# Patient Record
Sex: Female | Born: 1935 | Race: White | Hispanic: No | Smoking: Never smoker
Health system: Southern US, Community
[De-identification: ages and names within clinical notes are randomized; demographics above are authoritative.]

## PROBLEM LIST (undated history)

## (undated) DIAGNOSIS — J189 Pneumonia, unspecified organism: Secondary | ICD-10-CM

## (undated) DIAGNOSIS — K754 Autoimmune hepatitis: Secondary | ICD-10-CM

## (undated) DIAGNOSIS — M170 Bilateral primary osteoarthritis of knee: Secondary | ICD-10-CM

## (undated) DIAGNOSIS — K5790 Diverticulosis of intestine, part unspecified, without perforation or abscess without bleeding: Secondary | ICD-10-CM

## (undated) DIAGNOSIS — R9431 Abnormal electrocardiogram [ECG] [EKG]: Secondary | ICD-10-CM

## (undated) DIAGNOSIS — M51369 Other intervertebral disc degeneration, lumbar region without mention of lumbar back pain or lower extremity pain: Secondary | ICD-10-CM

## (undated) DIAGNOSIS — M5136 Other intervertebral disc degeneration, lumbar region: Secondary | ICD-10-CM

## (undated) DIAGNOSIS — I00 Rheumatic fever without heart involvement: Secondary | ICD-10-CM

## (undated) DIAGNOSIS — I480 Paroxysmal atrial fibrillation: Secondary | ICD-10-CM

## (undated) DIAGNOSIS — R600 Localized edema: Secondary | ICD-10-CM

## (undated) DIAGNOSIS — S52302A Unspecified fracture of shaft of left radius, initial encounter for closed fracture: Secondary | ICD-10-CM

## (undated) DIAGNOSIS — D039 Melanoma in situ, unspecified: Secondary | ICD-10-CM

## (undated) DIAGNOSIS — Z87442 Personal history of urinary calculi: Secondary | ICD-10-CM

## (undated) DIAGNOSIS — F419 Anxiety disorder, unspecified: Secondary | ICD-10-CM

## (undated) DIAGNOSIS — M8580 Other specified disorders of bone density and structure, unspecified site: Secondary | ICD-10-CM

## (undated) DIAGNOSIS — I5032 Chronic diastolic (congestive) heart failure: Secondary | ICD-10-CM

## (undated) DIAGNOSIS — I10 Essential (primary) hypertension: Secondary | ICD-10-CM

## (undated) DIAGNOSIS — M1990 Unspecified osteoarthritis, unspecified site: Secondary | ICD-10-CM

## (undated) DIAGNOSIS — K7460 Unspecified cirrhosis of liver: Secondary | ICD-10-CM

## (undated) DIAGNOSIS — N182 Chronic kidney disease, stage 2 (mild): Secondary | ICD-10-CM

## (undated) DIAGNOSIS — R609 Edema, unspecified: Secondary | ICD-10-CM

## (undated) DIAGNOSIS — M1712 Unilateral primary osteoarthritis, left knee: Secondary | ICD-10-CM

## (undated) DIAGNOSIS — Z86010 Personal history of colonic polyps: Secondary | ICD-10-CM

## (undated) DIAGNOSIS — R7989 Other specified abnormal findings of blood chemistry: Secondary | ICD-10-CM

## (undated) DIAGNOSIS — E785 Hyperlipidemia, unspecified: Secondary | ICD-10-CM

## (undated) DIAGNOSIS — K7581 Nonalcoholic steatohepatitis (NASH): Secondary | ICD-10-CM

## (undated) HISTORY — DX: Rheumatic fever without heart involvement: I00

## (undated) HISTORY — DX: Chronic diastolic (congestive) heart failure: I50.32

## (undated) HISTORY — DX: Chronic kidney disease, stage 2 (mild): N18.2

## (undated) HISTORY — DX: Other specified disorders of bone density and structure, unspecified site: M85.80

## (undated) HISTORY — DX: Other intervertebral disc degeneration, lumbar region: M51.36

## (undated) HISTORY — DX: Edema, unspecified: R60.9

## (undated) HISTORY — PX: ECTOPIC PREGNANCY SURGERY: SHX613

## (undated) HISTORY — DX: Hyperlipidemia, unspecified: E78.5

## (undated) HISTORY — PX: TRANSTHORACIC ECHOCARDIOGRAM: SHX275

## (undated) HISTORY — DX: Other specified abnormal findings of blood chemistry: R79.89

## (undated) HISTORY — DX: Personal history of colonic polyps: Z86.010

## (undated) HISTORY — PX: OTHER SURGICAL HISTORY: SHX169

## (undated) HISTORY — DX: Bilateral primary osteoarthritis of knee: M17.0

## (undated) HISTORY — DX: Nonalcoholic steatohepatitis (NASH): K75.81

## (undated) HISTORY — DX: Unspecified osteoarthritis, unspecified site: M19.90

## (undated) HISTORY — DX: Abnormal electrocardiogram (ECG) (EKG): R94.31

## (undated) HISTORY — DX: Unspecified cirrhosis of liver: K74.60

## (undated) HISTORY — DX: Pneumonia, unspecified organism: J18.9

## (undated) HISTORY — DX: Unspecified fracture of shaft of left radius, initial encounter for closed fracture: S52.302A

## (undated) HISTORY — DX: Anxiety disorder, unspecified: F41.9

## (undated) HISTORY — DX: Paroxysmal atrial fibrillation: I48.0

## (undated) HISTORY — PX: TONSILLECTOMY: SUR1361

## (undated) HISTORY — DX: Diverticulosis of intestine, part unspecified, without perforation or abscess without bleeding: K57.90

## (undated) HISTORY — DX: Essential (primary) hypertension: I10

## (undated) HISTORY — DX: Localized edema: R60.0

## (undated) HISTORY — DX: Other intervertebral disc degeneration, lumbar region without mention of lumbar back pain or lower extremity pain: M51.369

## (undated) HISTORY — DX: Autoimmune hepatitis: K75.4

## (undated) HISTORY — PX: EYE SURGERY: SHX253

---
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
## 1964-01-18 HISTORY — PX: APPENDECTOMY: SHX54

## 2000-01-18 DIAGNOSIS — Z860101 Personal history of adenomatous and serrated colon polyps: Secondary | ICD-10-CM

## 2000-01-18 DIAGNOSIS — Z86010 Personal history of colonic polyps: Secondary | ICD-10-CM

## 2000-01-18 HISTORY — DX: Personal history of colonic polyps: Z86.010

## 2000-01-18 HISTORY — DX: Personal history of adenomatous and serrated colon polyps: Z86.0101

## 2000-10-30 ENCOUNTER — Encounter (INDEPENDENT_AMBULATORY_CARE_PROVIDER_SITE_OTHER): Payer: Self-pay | Admitting: Specialist

## 2000-10-30 ENCOUNTER — Other Ambulatory Visit: Admission: RE | Admit: 2000-10-30 | Discharge: 2000-10-30 | Payer: Self-pay | Admitting: Gastroenterology

## 2003-01-09 ENCOUNTER — Other Ambulatory Visit: Admission: RE | Admit: 2003-01-09 | Discharge: 2003-01-09 | Payer: Self-pay | Admitting: Family Medicine

## 2004-04-14 ENCOUNTER — Ambulatory Visit: Payer: Self-pay | Admitting: Family Medicine

## 2004-05-13 ENCOUNTER — Ambulatory Visit: Payer: Self-pay | Admitting: Family Medicine

## 2004-06-02 ENCOUNTER — Ambulatory Visit: Payer: Self-pay

## 2004-06-02 ENCOUNTER — Encounter: Admission: RE | Admit: 2004-06-02 | Discharge: 2004-06-02 | Payer: Self-pay | Admitting: Family Medicine

## 2004-06-23 ENCOUNTER — Ambulatory Visit: Payer: Self-pay | Admitting: Internal Medicine

## 2004-08-04 ENCOUNTER — Ambulatory Visit: Payer: Self-pay | Admitting: Family Medicine

## 2005-01-12 ENCOUNTER — Ambulatory Visit: Payer: Self-pay | Admitting: Family Medicine

## 2005-01-14 ENCOUNTER — Ambulatory Visit: Payer: Self-pay | Admitting: Family Medicine

## 2005-05-14 ENCOUNTER — Emergency Department (HOSPITAL_COMMUNITY): Admission: EM | Admit: 2005-05-14 | Discharge: 2005-05-14 | Payer: Self-pay | Admitting: Emergency Medicine

## 2005-05-17 ENCOUNTER — Encounter: Payer: Self-pay | Admitting: Family Medicine

## 2005-05-17 ENCOUNTER — Ambulatory Visit: Payer: Self-pay | Admitting: Family Medicine

## 2005-05-17 ENCOUNTER — Other Ambulatory Visit: Admission: RE | Admit: 2005-05-17 | Discharge: 2005-05-17 | Payer: Self-pay | Admitting: Family Medicine

## 2005-05-19 ENCOUNTER — Encounter: Payer: Self-pay | Admitting: Family Medicine

## 2005-05-19 LAB — CONVERTED CEMR LAB: Pap Smear: NORMAL

## 2006-05-30 ENCOUNTER — Emergency Department (HOSPITAL_COMMUNITY): Admission: EM | Admit: 2006-05-30 | Discharge: 2006-05-30 | Payer: Self-pay | Admitting: *Deleted

## 2006-06-01 ENCOUNTER — Ambulatory Visit: Payer: Self-pay | Admitting: Family Medicine

## 2006-06-15 ENCOUNTER — Ambulatory Visit: Payer: Self-pay | Admitting: Family Medicine

## 2006-07-18 ENCOUNTER — Ambulatory Visit: Payer: Self-pay | Admitting: Family Medicine

## 2006-11-02 ENCOUNTER — Encounter: Payer: Self-pay | Admitting: Family Medicine

## 2006-12-20 ENCOUNTER — Telehealth: Payer: Self-pay | Admitting: Family Medicine

## 2006-12-27 ENCOUNTER — Ambulatory Visit: Payer: Self-pay | Admitting: Family Medicine

## 2006-12-27 DIAGNOSIS — F411 Generalized anxiety disorder: Secondary | ICD-10-CM | POA: Insufficient documentation

## 2006-12-27 DIAGNOSIS — M129 Arthropathy, unspecified: Secondary | ICD-10-CM | POA: Insufficient documentation

## 2006-12-27 DIAGNOSIS — IMO0002 Reserved for concepts with insufficient information to code with codable children: Secondary | ICD-10-CM

## 2007-01-18 DIAGNOSIS — K5790 Diverticulosis of intestine, part unspecified, without perforation or abscess without bleeding: Secondary | ICD-10-CM

## 2007-01-18 HISTORY — DX: Diverticulosis of intestine, part unspecified, without perforation or abscess without bleeding: K57.90

## 2007-02-13 ENCOUNTER — Telehealth: Payer: Self-pay | Admitting: Family Medicine

## 2007-03-14 ENCOUNTER — Ambulatory Visit: Payer: Self-pay | Admitting: Family Medicine

## 2007-04-04 ENCOUNTER — Ambulatory Visit: Payer: Self-pay | Admitting: Family Medicine

## 2007-04-04 DIAGNOSIS — I10 Essential (primary) hypertension: Secondary | ICD-10-CM

## 2007-04-04 DIAGNOSIS — F410 Panic disorder [episodic paroxysmal anxiety] without agoraphobia: Secondary | ICD-10-CM

## 2007-04-04 DIAGNOSIS — E785 Hyperlipidemia, unspecified: Secondary | ICD-10-CM

## 2007-04-05 ENCOUNTER — Ambulatory Visit: Payer: Self-pay | Admitting: Family Medicine

## 2007-06-12 ENCOUNTER — Telehealth: Payer: Self-pay | Admitting: Family Medicine

## 2007-06-14 ENCOUNTER — Encounter: Payer: Self-pay | Admitting: Family Medicine

## 2007-06-26 ENCOUNTER — Encounter: Payer: Self-pay | Admitting: Family Medicine

## 2007-08-14 ENCOUNTER — Ambulatory Visit: Payer: Self-pay | Admitting: Family Medicine

## 2007-08-14 DIAGNOSIS — R609 Edema, unspecified: Secondary | ICD-10-CM

## 2007-11-07 ENCOUNTER — Ambulatory Visit: Payer: Self-pay | Admitting: Family Medicine

## 2007-11-07 ENCOUNTER — Other Ambulatory Visit: Admission: RE | Admit: 2007-11-07 | Discharge: 2007-11-07 | Payer: Self-pay | Admitting: Family Medicine

## 2007-11-07 ENCOUNTER — Encounter: Payer: Self-pay | Admitting: Family Medicine

## 2007-11-07 DIAGNOSIS — G4700 Insomnia, unspecified: Secondary | ICD-10-CM

## 2007-11-07 DIAGNOSIS — M8580 Other specified disorders of bone density and structure, unspecified site: Secondary | ICD-10-CM

## 2007-11-07 DIAGNOSIS — D649 Anemia, unspecified: Secondary | ICD-10-CM | POA: Insufficient documentation

## 2007-11-07 DIAGNOSIS — T50995A Adverse effect of other drugs, medicaments and biological substances, initial encounter: Secondary | ICD-10-CM

## 2007-11-07 DIAGNOSIS — E039 Hypothyroidism, unspecified: Secondary | ICD-10-CM | POA: Insufficient documentation

## 2007-11-07 LAB — HM PAP SMEAR

## 2007-11-08 LAB — CONVERTED CEMR LAB
ALT: 29 units/L (ref 0–35)
AST: 26 units/L (ref 0–37)
Alkaline Phosphatase: 74 units/L (ref 39–117)
Basophils Relative: 1.4 % (ref 0.0–3.0)
CO2: 28 meq/L (ref 19–32)
Chloride: 107 meq/L (ref 96–112)
Eosinophils Absolute: 0.2 10*3/uL (ref 0.0–0.7)
GFR calc Af Amer: 52 mL/min
GFR calc non Af Amer: 43 mL/min
Hemoglobin: 13.4 g/dL (ref 12.0–15.0)
Lymphocytes Relative: 33.6 % (ref 12.0–46.0)
Monocytes Absolute: 0.3 10*3/uL (ref 0.1–1.0)
Neutro Abs: 2.9 10*3/uL (ref 1.4–7.7)
Platelets: 208 10*3/uL (ref 150–400)
Potassium: 3.7 meq/L (ref 3.5–5.1)
RBC: 4.49 M/uL (ref 3.87–5.11)
RDW: 12.7 % (ref 11.5–14.6)
Total Bilirubin: 1 mg/dL (ref 0.3–1.2)
Total CHOL/HDL Ratio: 2.8
Total Protein: 7.2 g/dL (ref 6.0–8.3)
Vit D, 1,25-Dihydroxy: 31 (ref 30–89)

## 2007-11-22 ENCOUNTER — Encounter: Payer: Self-pay | Admitting: Family Medicine

## 2007-12-07 ENCOUNTER — Ambulatory Visit: Payer: Self-pay | Admitting: Internal Medicine

## 2007-12-19 ENCOUNTER — Encounter: Payer: Self-pay | Admitting: Internal Medicine

## 2007-12-19 ENCOUNTER — Ambulatory Visit: Payer: Self-pay | Admitting: Internal Medicine

## 2007-12-20 ENCOUNTER — Encounter: Payer: Self-pay | Admitting: Internal Medicine

## 2008-01-09 ENCOUNTER — Encounter: Payer: Self-pay | Admitting: Family Medicine

## 2008-02-13 ENCOUNTER — Ambulatory Visit: Payer: Self-pay | Admitting: Family Medicine

## 2008-02-19 ENCOUNTER — Telehealth: Payer: Self-pay | Admitting: Family Medicine

## 2008-04-30 ENCOUNTER — Telehealth (INDEPENDENT_AMBULATORY_CARE_PROVIDER_SITE_OTHER): Payer: Self-pay | Admitting: *Deleted

## 2008-05-20 ENCOUNTER — Ambulatory Visit: Payer: Self-pay | Admitting: Family Medicine

## 2008-05-20 DIAGNOSIS — N6019 Diffuse cystic mastopathy of unspecified breast: Secondary | ICD-10-CM | POA: Insufficient documentation

## 2008-07-04 ENCOUNTER — Encounter: Payer: Self-pay | Admitting: Family Medicine

## 2008-07-09 ENCOUNTER — Encounter: Payer: Self-pay | Admitting: Family Medicine

## 2008-07-16 ENCOUNTER — Encounter: Payer: Self-pay | Admitting: Family Medicine

## 2008-09-09 ENCOUNTER — Ambulatory Visit: Payer: Self-pay | Admitting: Family Medicine

## 2008-09-09 DIAGNOSIS — B029 Zoster without complications: Secondary | ICD-10-CM | POA: Insufficient documentation

## 2008-10-31 ENCOUNTER — Encounter: Payer: Self-pay | Admitting: Family Medicine

## 2008-11-04 ENCOUNTER — Encounter: Payer: Self-pay | Admitting: Family Medicine

## 2008-12-10 ENCOUNTER — Ambulatory Visit: Payer: Self-pay | Admitting: Family Medicine

## 2008-12-16 ENCOUNTER — Encounter: Payer: Self-pay | Admitting: Family Medicine

## 2008-12-17 ENCOUNTER — Encounter (INDEPENDENT_AMBULATORY_CARE_PROVIDER_SITE_OTHER): Payer: Self-pay | Admitting: *Deleted

## 2009-01-07 ENCOUNTER — Encounter: Payer: Self-pay | Admitting: Family Medicine

## 2009-07-15 ENCOUNTER — Encounter: Payer: Self-pay | Admitting: Family Medicine

## 2009-09-01 ENCOUNTER — Ambulatory Visit: Payer: Self-pay | Admitting: Family Medicine

## 2009-09-01 DIAGNOSIS — J029 Acute pharyngitis, unspecified: Secondary | ICD-10-CM

## 2009-09-01 DIAGNOSIS — M5430 Sciatica, unspecified side: Secondary | ICD-10-CM

## 2009-09-01 DIAGNOSIS — S336XXA Sprain of sacroiliac joint, initial encounter: Secondary | ICD-10-CM | POA: Insufficient documentation

## 2009-12-30 ENCOUNTER — Encounter: Payer: Self-pay | Admitting: Family Medicine

## 2009-12-31 ENCOUNTER — Telehealth: Payer: Self-pay | Admitting: Family Medicine

## 2010-01-21 ENCOUNTER — Telehealth: Payer: Self-pay | Admitting: Family Medicine

## 2010-02-14 LAB — CONVERTED CEMR LAB
AST: 26 units/L (ref 0–37)
Albumin: 4.3 g/dL (ref 3.5–5.2)
Alkaline Phosphatase: 93 units/L (ref 39–117)
BUN: 22 mg/dL (ref 6–23)
Bilirubin Urine: NEGATIVE
Bilirubin, Direct: 0 mg/dL (ref 0.0–0.3)
CO2: 31 meq/L (ref 19–32)
Cholesterol: 165 mg/dL (ref 0–200)
Creatinine, Ser: 1.2 mg/dL (ref 0.4–1.2)
Eosinophils Absolute: 0.3 10*3/uL (ref 0.0–0.7)
Eosinophils Relative: 6 % — ABNORMAL HIGH (ref 0.0–5.0)
Glucose, Bld: 102 mg/dL — ABNORMAL HIGH (ref 70–99)
HDL: 56.9 mg/dL (ref >39.00)
Hemoglobin: 14.5 g/dL (ref 12.0–15.0)
Ketones, urine, test strip: NEGATIVE
LDL Cholesterol: 84 mg/dL (ref 0–99)
Lymphs Abs: 1.7 10*3/uL (ref 0.7–4.0)
MCHC: 34.1 g/dL (ref 30.0–36.0)
MCV: 92.1 fL (ref 78.0–100.0)
Monocytes Absolute: 0.3 10*3/uL (ref 0.1–1.0)
Monocytes Relative: 6 % (ref 3.0–12.0)
Neutro Abs: 2.9 10*3/uL (ref 1.4–7.7)
Protein, U semiquant: NEGATIVE
RBC: 4.62 M/uL (ref 3.87–5.11)
Total CHOL/HDL Ratio: 3
Total Protein: 7.7 g/dL (ref 6.0–8.3)
Triglycerides: 123 mg/dL (ref 0.0–149.0)
pH: 6

## 2010-02-16 NOTE — Assessment & Plan Note (Signed)
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 Pain 10 been increasing in severity Patient had developed a sore throat and had a Z-Pak which he started one day prior to seeing me in Pain with severe nausea and to take hydrocodone with some relief only taking 5 mg Q6 to 8 hour She is very anxious person and alprazolam helps considerably blood pressure has been controlled 130/76  Allergies (verified): No Known Drug Allergies  Past History:  Past Medical History: Last updated: 04/04/2007 Hyperlipidemia Hypertension hx rhuematic fever arthritis  Past Surgical History: Last updated: 04/04/2007 Appendectomy Tonsillectomy  Social History: Last updated: 04/04/2007 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, 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  Risk Factors: Smoking Status: never (12/10/2008)  Review of Systems      See HPI  The patient denies anorexia, fever, weight loss, weight gain, vision loss, decreased hearing, hoarseness, chest pain, syncope, dyspnea on exertion, peripheral edema, prolonged cough, headaches, hemoptysis, abdominal pain, melena, hematochezia, severe indigestion/heartburn, hematuria, incontinence, genital sores, muscle weakness, suspicious skin lesions, transient blindness, difficulty walking, depression, unusual weight change, abnormal bleeding, enlarged lymph nodes, angioedema, breast masses, and testicular masses.    Physical  Exam  General:  Well-developed,well-nourished,in no acute distress; alert,appropriate and cooperative throughout examination, appears to be in pain Head:  Normocephalic and atraumatic without obvious abnormalities. No apparent alopecia or balding. Eyes:  No corneal or conjunctival inflammation noted. EOMI. Perrla. Funduscopic exam benign, without hemorrhages, exudates or papilledema. Vision grossly normal. Ears:  External ear exam shows no significant lesions or deformities.  Otoscopic examination reveals clear canals, tympanic membranes are intact bilaterally without bulging, retraction, inflammation or discharge. Hearing is grossly normal bilaterally. Nose:  External nasal examination shows no deformity or inflammation. Nasal mucosa are pink and moist without lesions or exudates. Mouth:  pharyngeal erythema.   small anterior cervical nodes Lungs:  Normal respiratory effort, chest expands symmetrically. Lungs are clear to auscultation, no crackles or wheezes. Heart:  Normal rate and regular rhythm. S1 and S2 normal without gallop, murmur, click, rub or other extra sounds. Msk:  largest there tenderness over both sacroiliac joint, no limitation straight leg raise and, no changes in reflexes Extremities:  No clubbing, cyanosis, edema, or deformity noted with normal full range of motion of all joints.   Skin:  Intact without suspicious lesions or rashes Cervical Nodes:  small anterior cervical nodes   Problems:  Medical Problems Added: 1)  Dx of Acute Pharyngitis  (ICD-462) 2)  Dx of Sciatica, Bilateral  (ICD-724.3) 3)  Dx of Sacroiliac Strain, Acute  (ICD-846.9)  Impression & Recommendations:  Problem # 1:  ACUTE PHARYNGITIS (ICD-462) Assessment New  Her updated medication list for this problem includes:    Adult Aspirin Low Strength 81 Mg Chew (Aspirin) .Marland Kitchen... Take 1 tablet by mouth once  a day    Meloxicam 15 Mg Tabs (Meloxicam) .Marland Kitchen... Take 1 tablet by mouth once a day    Zithromax  Z-pak 250 Mg Tabs (Azithromycin) .Marland Kitchen... 2 now then 1 once daily for infection  Problem # 2:  SCIATICA, BILATERAL (ICD-724.3) Assessment: New  Her updated medication list for this problem includes:    Adult Aspirin Low Strength 81 Mg Chew (Aspirin) .Marland Kitchen... Take 1 tablet by mouth once a day    Meloxicam 15 Mg Tabs (Meloxicam) .Marland Kitchen... Take 1 tablet by mouth once a day    Hydrocodone-acetaminophen 10-650 Mg Tabs (Hydrocodone-acetaminophen) .Marland Kitchen... 1/2 -1 tab q4-6h as needed pain  Problem # 3:  SACROILIAC STRAIN, ACUTE (ICD-846.9) Assessment: New bilateral SI inflammation  Problem # 4:  HYPERTENSION (ICD-401.9) Assessment: Improved  Her updated medication list for this problem includes:    Norvasc 10 Mg Tabs (Amlodipine besylate) .Marland Kitchen... Take 1 tablet by mouth once a day    Furosemide 40 Mg Tabs (Furosemide) .Marland Kitchen... 1 once daily for edema    Cozaar 50 Mg Tabs (Losartan potassium) .Marland Kitchen... 1 by mouth once daily  Problem # 5:  ANXIETY (ICD-300.00) Assessment: Improved  Her updated medication list for this problem includes:    Alprazolam 0.5 Mg Tbdp (Alprazolam) .Marland Kitchen... 1 three times a day for stress  Problem # 6:  INSOMNIA (ICD-780.52) Assessment: Unchanged  Her updated medication list for this problem includes:    Ambien 10 Mg Tabs (Zolpidem tartrate) .Marland Kitchen... Take 1 tablet by mouth at bedtime  Problem # 7:  HYPERLIPIDEMIA (ICD-272.4)  The following medications were removed from the medication list:    Simvastatin 80 Mg Tabs (Simvastatin) .Marland Kitchen... 1 by mouth once daily Her updated medication list for this problem includes:    Lipitor 40 Mg Tabs (Atorvastatin calcium) .Marland Kitchen... 1 once daily for hyperlipidemia  Complete Medication List: 1)  Adult Aspirin Low Strength 81 Mg Chew (Aspirin) .... Take 1 tablet by mouth once a day 2)  Ambien 10 Mg Tabs (Zolpidem tartrate) .... Take 1 tablet by mouth at bedtime 3)  Meloxicam 15 Mg Tabs (Meloxicam) .... Take 1 tablet by mouth once a day 4)  Norvasc 10 Mg Tabs  (Amlodipine besylate) .... Take 1 tablet by mouth once a day 5)  Sm Calcium/vitamin D 500-200 Mg-unit Tabs (Calcium carbonate-vitamin d) .... Take 2 tablet by mouth once a day 6)  Furosemide 40 Mg Tabs (Furosemide) .Marland Kitchen.. 1 once daily for edema 7)  Alprazolam 0.5 Mg Tbdp (Alprazolam) .Marland Kitchen.. 1 three times a day for stress 8)  Vaniqa 13.9 % Crea (Eflornithine hcl) .... Use two times a day 9)  Cozaar 50 Mg Tabs (Losartan potassium) .Marland Kitchen.. 1 by mouth once daily 10)  Zithromax Z-pak 250 Mg Tabs (Azithromycin) .... 2 now then 1 once daily for infection 11)  Vitamin C 500 Mg Tabs (Ascorbic acid) .Marland Kitchen.. 1 bid 12)  Centrum Silver Tabs (Multiple vitamins-minerals) .Marland Kitchen.. 1 qd 13)  B Complex-b12 Tabs (B complex vitamins) .... One q.d. 14)  Vitamin D3 400 Unit Tabs (Cholecalciferol) .... One q.d. 15)  Fish Oil 1200 Mg Caps (Omega-3 fatty acids) .... One b.i.d. 16)  Coq-10 200 Mg Caps (Coenzyme q10) .... One q.d. 17)  Lipitor 40 Mg Tabs (Atorvastatin calcium) .Marland Kitchen.. 1 once daily for hyperlipidemia 18)  Hydrocodone-acetaminophen 10-650 Mg Tabs (Hydrocodone-acetaminophen) .... 1/2 -1 tab q4-6h as needed pain  Other Orders: Depo- Medrol 37m (J1040) Depo- Medrol 423m(J1030) Admin of Therapeutic Inj  intramuscular or subcutaneous ([phone] Patient Instructions: 1)  since discharge returning from ureter P. and friable on 12 August and after seeing the stress and strain of blood into the bladder stair have inflammation of the sacroiliac joints bilaterally with radiation into the legs to the popliteal region 2)  Continue meloxicam 3)  We'll give you  Depo-Medrol 120 mg IM 4)  we'll prescribe hydrocodone 10 mg-acetaminophen 650 q.4 h. p.r.n. for pain take one half to one tab as needed 5)  Continue her Z-Pak for your scratchy throat or pharyngitis Prescriptions: HYDROCODONE-ACETAMINOPHEN 10-650 MG TABS (HYDROCODONE-ACETAMINOPHEN) 1/2 -1 tab q4-6h as needed pain  #60 x 5   Entered and Authorized by:   Emeterio Reeve  MD   Signed by:   Emeterio Reeve MD on 09/01/2009   Method used:   Print then Give to Patient   RxID:   [id number] LIPITOR 40 MG TABS (ATORVASTATIN CALCIUM) 1 once daily for hyperlipidemia  #30 x 11   Entered and Authorized by:   Emeterio Reeve MD   Signed by:   Emeterio Reeve MD on 09/01/2009   Method used:   Electronically to        [facility]. [phone]* (retail)       [address]       [place], [place]  [address]       Ph: [phone]       Fax: [phone]   RxID:   [id number]    Medication Administration  Injection # 1:    Medication: Depo- Medrol 45m    Diagnosis: ARTHRITIS (ICD-716.90)    Route: IM    Site: RUOQ gluteus    Exp Date: 04/2012    Lot #: [id number]    Mfr: Pharmacia    Patient tolerated injection without complications    Given by: RLevora Angel RN (September 01, 2009 1:33 PM)  Injection # 2:    Medication: Depo- Medrol 410m   Diagnosis: ARTHRITIS (ICD-716.90)    Route: IM    Site: RUOQ gluteus    Exp Date: 04/2012    Lot #: [id number]    Mfr: Pharmacia    Patient tolerated injection without complications    Given by: ReLevora AngelRN (September 01, 2009 1:34 PM)  Orders Added: 1)  Depo- Medrol 8030mJ1040] 2)  Depo- Medrol 82m67m1030] 3)  Admin of Therapeutic Inj  intramuscular or subcutaneous [96372] 4)  Est. Patient Level IV [[id number]]

## 2010-02-18 NOTE — Miscellaneous (Signed)
Summary: flu vaccine  Clinical Lists Changes  Observations: Added new observation of FLU VAX: Historical (12/29/2009 12:09)      Immunization History:  Influenza Immunization History:    Influenza:  historical (12/29/2009)

## 2010-02-18 NOTE — Progress Notes (Signed)
Summary: REQUEST FOR VERIFICATION  Phone Note From Pharmacy   Caller: [facility]. #[id number]* Summary of Call: Called to adv that they faxed in a request and received a msg left on their v/m from [place].... Rx request for xanax but a Rx for Lorrin Mais was sent to them (both are on med list) ? ..... Would like a return call to verify same.....  # [phone].  Initial call taken by: Duanne Moron,  December 31, 2009 12:14 PM  Follow-up for Phone Call        both refills called in Follow-up by: Westley Hummer CMA Deborra Medina),  January 01, 2010 10:50 AM

## 2010-02-18 NOTE — Progress Notes (Signed)
Summary: Pt req a fasting cpx befor May 2012  Phone Note Call from Patient Call back at [facility] Main Phone [phone] Call back at Work Phone [phone]   Caller: Patient Summary of Call: Pt has sch her fasting cpx for 05/25/10 at 10am, because it was the first available a.m cpx. Pt req to be worked in sooner that that for fasting cpx. Pls advise.  Initial call taken by: Braulio Bosch,  January 21, 2010 11:06 AM  Follow-up for Phone Call        [facility] to advise pt no available cpx appt before 05/25/10. Follow-up by: Candace Cruise,  January 26, 2010 12:55 PM  Additional Follow-up for Phone Call Additional follow up Details #1::        Pt returned call and has been notified with the info noted above.    Additional Follow-up by: Braulio Bosch,  January 27, 2010 8:08 AM

## 2010-03-04 ENCOUNTER — Other Ambulatory Visit: Payer: Self-pay | Admitting: Family Medicine

## 2010-04-11 ENCOUNTER — Other Ambulatory Visit: Payer: Self-pay | Admitting: Family Medicine

## 2010-04-20 ENCOUNTER — Other Ambulatory Visit: Payer: Self-pay | Admitting: Family Medicine

## 2010-04-27 ENCOUNTER — Other Ambulatory Visit: Payer: Self-pay

## 2010-04-27 MED ORDER — ALPRAZOLAM 0.5 MG PO TABS: 0.5000 mg | ORAL_TABLET | Freq: Three times a day (TID) | ORAL | Status: DC | PRN

## 2010-04-27 NOTE — Telephone Encounter (Signed)
rx faxed to [facility] on [address]

## 2010-05-25 ENCOUNTER — Ambulatory Visit (INDEPENDENT_AMBULATORY_CARE_PROVIDER_SITE_OTHER): Payer: Medicare Other | Admitting: Family Medicine

## 2010-05-25 ENCOUNTER — Encounter: Payer: Self-pay | Admitting: Family Medicine

## 2010-05-25 VITALS — BP 122/70 | HR 87 | Temp 98.8°F | Resp 15 | Ht 62.0 in | Wt 168.0 lb

## 2010-05-25 DIAGNOSIS — I10 Essential (primary) hypertension: Secondary | ICD-10-CM

## 2010-05-25 DIAGNOSIS — E039 Hypothyroidism, unspecified: Secondary | ICD-10-CM

## 2010-05-25 DIAGNOSIS — G4700 Insomnia, unspecified: Secondary | ICD-10-CM

## 2010-05-25 DIAGNOSIS — D649 Anemia, unspecified: Secondary | ICD-10-CM

## 2010-05-25 DIAGNOSIS — R350 Frequency of micturition: Secondary | ICD-10-CM

## 2010-05-25 DIAGNOSIS — E559 Vitamin D deficiency, unspecified: Secondary | ICD-10-CM

## 2010-05-25 DIAGNOSIS — F410 Panic disorder [episodic paroxysmal anxiety] without agoraphobia: Secondary | ICD-10-CM

## 2010-05-25 DIAGNOSIS — E785 Hyperlipidemia, unspecified: Secondary | ICD-10-CM

## 2010-05-25 LAB — BASIC METABOLIC PANEL
BUN: 18 mg/dL (ref 6–23)
CO2: 29 mEq/L (ref 19–32)
Chloride: 106 mEq/L (ref 96–112)
Creatinine, Ser: 0.9 mg/dL (ref 0.4–1.2)

## 2010-05-25 LAB — HEPATIC FUNCTION PANEL
ALT: 24 U/L (ref 0–35)
Albumin: 4.2 g/dL (ref 3.5–5.2)
Bilirubin, Direct: 0.1 mg/dL (ref 0.0–0.3)
Total Protein: 7.2 g/dL (ref 6.0–8.3)

## 2010-05-25 LAB — POCT URINALYSIS DIPSTICK
Bilirubin, UA: NEGATIVE
Blood, UA: NEGATIVE
Nitrite, UA: NEGATIVE
Protein, UA: NEGATIVE
Urobilinogen, UA: 0.2
pH, UA: 5

## 2010-05-25 LAB — CBC WITH DIFFERENTIAL/PLATELET
Basophils Relative: 0.4 % (ref 0.0–3.0)
HCT: 42.7 % (ref 36.0–46.0)
Hemoglobin: 14.3 g/dL (ref 12.0–15.0)
Lymphocytes Relative: 29.3 % (ref 12.0–46.0)
MCHC: 33.4 g/dL (ref 30.0–36.0)
Monocytes Relative: 5.1 % (ref 3.0–12.0)
Neutro Abs: 4.3 10*3/uL (ref 1.4–7.7)
RBC: 4.77 Mil/uL (ref 3.87–5.11)

## 2010-05-25 LAB — LIPID PANEL
Cholesterol: 155 mg/dL (ref 0–200)
LDL Cholesterol: 70 mg/dL (ref 0–99)

## 2010-05-25 MED ORDER — ALPRAZOLAM 0.5 MG PO TABS: ORAL_TABLET | ORAL | Status: DC

## 2010-05-25 MED ORDER — EFLORNITHINE HCL 13.9 % EX CREA: TOPICAL_CREAM | CUTANEOUS | Status: DC

## 2010-05-25 MED ORDER — AMLODIPINE BESYLATE 5 MG PO TABS: 5.0000 mg | ORAL_TABLET | Freq: Every day | ORAL | Status: DC

## 2010-05-25 MED ORDER — ATORVASTATIN CALCIUM 40 MG PO TABS: 40.0000 mg | ORAL_TABLET | Freq: Every day | ORAL | Status: DC

## 2010-05-25 MED ORDER — LOSARTAN POTASSIUM 100 MG PO TABS: 100.0000 mg | ORAL_TABLET | Freq: Every day | ORAL | Status: DC

## 2010-05-25 MED ORDER — FUROSEMIDE 40 MG PO TABS: 40.0000 mg | ORAL_TABLET | Freq: Two times a day (BID) | ORAL | Status: DC

## 2010-05-25 MED ORDER — MELOXICAM 15 MG PO TABS: 15.0000 mg | ORAL_TABLET | Freq: Every day | ORAL | Status: DC

## 2010-05-25 NOTE — Patient Instructions (Addendum)
I think you are doing fine, to help with edema take 7m furosemide in am and 20 mg after lunch.  Wear stcokings as you need to Changed dosage onlosartan and amlodypine,increase losartan and decrease amlodipine to 5 mgWill call results of lab, refilled medications

## 2010-05-31 ENCOUNTER — Encounter: Payer: Self-pay | Admitting: Family Medicine

## 2010-05-31 NOTE — Progress Notes (Signed)
  Subjective:    Patient ID: Brenda Matthews, female    DOB: Jun 15, 1935, 75 y.o.   MRN: [id number] This 75 year old white married female is in to discuss her medical problems as well as review her medications and get this her lab studies She relates she continues to have panic attacks and needs her alprazolam at times. She had been under considerable stress recently with the problems within the family in regard to her son who is an alcoholic and is now in rehabilitation Her problem with peripheral edema is much improved Hypertension is well controlled, arthritis is controlled with meloxicam Insomnia has been much improved despite the fact she has sleep apnea which were going to get a sleep study Patient has had herpes zoster in the last 2 years so does not need zostavac  at this time HPI    Review of Systemssee history of present illness    Objective:   Physical Exam the patient is a well-developed well-nourished white female  HEENT negative including carotid arteries to be good no bruits  thyroid nonpalpable Chest lungs are clear to palpation percussion and auscultation no rales noted almost no wheezingBreasts full no mass is less cystic than previously  Nipples everted, normal Axilla clear no lymphadenopathy Heart no evidence for cardiomegaly no murmurs regular rhythm Abdomen liver spleen kidney nonpalpable Pelvic and rectal examination not done on this examination Extremities trace peripheral edema Neurological negative reflexes 2+ bilaterally Skin negative       Assessment & Plan:  Hypertension no change in treatment Plan to check with Dr. Henrene Pastor regarding scheduling  of the colonoscopic exam Panic attack to continue alprazolam as instructed2 schedule bone density Plan to get laboratory studies Refill medications Impression this patient is doing her well and has her medical problems under control

## 2010-08-11 LAB — HM MAMMOGRAPHY

## 2010-08-13 ENCOUNTER — Encounter: Payer: Self-pay | Admitting: Family Medicine

## 2010-08-13 ENCOUNTER — Other Ambulatory Visit: Payer: Self-pay

## 2010-08-13 MED ORDER — LOSARTAN POTASSIUM 50 MG PO TABS: 50.0000 mg | ORAL_TABLET | Freq: Every day | ORAL | Status: DC

## 2010-08-13 NOTE — Telephone Encounter (Signed)
Per Dr. Joni Fears pt is to stay on losartan 50 mg qd; rx sent to pharmacy.

## 2010-08-23 ENCOUNTER — Telehealth: Payer: Self-pay | Admitting: Family Medicine

## 2010-08-23 NOTE — Telephone Encounter (Signed)
Pt has questions re: bone density results. Pt rcvd a copy of results, but does not understand information.

## 2010-08-23 NOTE — Telephone Encounter (Signed)
Per Dr. Joni Fears called to make pt aware that overall pt;s bone density was good and pt is aware of recommendations.

## 2010-08-25 ENCOUNTER — Encounter: Payer: Self-pay | Admitting: Family Medicine

## 2010-09-22 ENCOUNTER — Other Ambulatory Visit: Payer: Self-pay

## 2010-09-22 MED ORDER — CIPROFLOXACIN HCL 500 MG PO TABS: 500.0000 mg | ORAL_TABLET | Freq: Two times a day (BID) | ORAL | Status: AC

## 2010-09-22 MED ORDER — AZITHROMYCIN 250 MG PO TABS: ORAL_TABLET | ORAL | Status: AC

## 2010-10-26 ENCOUNTER — Other Ambulatory Visit: Payer: Self-pay | Admitting: Family Medicine

## 2010-11-16 ENCOUNTER — Emergency Department (HOSPITAL_COMMUNITY)
Admission: EM | Admit: 2010-11-16 | Discharge: 2010-11-16 | Disposition: A | Discharge status: Home / Self Care | Payer: Medicare Other | Attending: Emergency Medicine | Admitting: Emergency Medicine

## 2010-11-16 ENCOUNTER — Emergency Department (HOSPITAL_COMMUNITY): Payer: Medicare Other

## 2010-11-16 DIAGNOSIS — W19XXXA Unspecified fall, initial encounter: Secondary | ICD-10-CM | POA: Insufficient documentation

## 2010-11-16 DIAGNOSIS — M25539 Pain in unspecified wrist: Secondary | ICD-10-CM | POA: Insufficient documentation

## 2010-11-16 DIAGNOSIS — S52599A Other fractures of lower end of unspecified radius, initial encounter for closed fracture: Secondary | ICD-10-CM | POA: Insufficient documentation

## 2010-11-16 DIAGNOSIS — I10 Essential (primary) hypertension: Secondary | ICD-10-CM | POA: Insufficient documentation

## 2010-11-16 DIAGNOSIS — M129 Arthropathy, unspecified: Secondary | ICD-10-CM | POA: Insufficient documentation

## 2010-11-16 DIAGNOSIS — Y92009 Unspecified place in unspecified non-institutional (private) residence as the place of occurrence of the external cause: Secondary | ICD-10-CM | POA: Insufficient documentation

## 2010-11-16 DIAGNOSIS — S52302A Unspecified fracture of shaft of left radius, initial encounter for closed fracture: Secondary | ICD-10-CM

## 2010-11-16 HISTORY — DX: Unspecified fracture of shaft of left radius, initial encounter for closed fracture: S52.302A

## 2010-11-18 HISTORY — PX: ORIF RADIAL FRACTURE: SHX5113

## 2011-01-18 HISTORY — PX: CATARACT EXTRACTION W/ INTRAOCULAR LENS IMPLANT: SHX1309

## 2011-01-22 ENCOUNTER — Other Ambulatory Visit: Payer: Self-pay | Admitting: Family Medicine

## 2011-02-04 ENCOUNTER — Ambulatory Visit: Payer: Medicare Other | Admitting: Family Medicine

## 2011-02-18 ENCOUNTER — Ambulatory Visit: Payer: Medicare Other | Admitting: Family Medicine

## 2011-02-24 ENCOUNTER — Ambulatory Visit (INDEPENDENT_AMBULATORY_CARE_PROVIDER_SITE_OTHER): Payer: Medicare Other | Admitting: Family Medicine

## 2011-02-24 ENCOUNTER — Encounter: Payer: Self-pay | Admitting: Family Medicine

## 2011-02-24 VITALS — BP 137/75 | HR 69 | Temp 99.0°F | Ht 62.0 in | Wt 170.0 lb

## 2011-02-24 DIAGNOSIS — F411 Generalized anxiety disorder: Secondary | ICD-10-CM

## 2011-02-24 DIAGNOSIS — E785 Hyperlipidemia, unspecified: Secondary | ICD-10-CM

## 2011-02-24 DIAGNOSIS — I10 Essential (primary) hypertension: Secondary | ICD-10-CM

## 2011-02-24 DIAGNOSIS — IMO0002 Reserved for concepts with insufficient information to code with codable children: Secondary | ICD-10-CM

## 2011-02-24 DIAGNOSIS — Z124 Encounter for screening for malignant neoplasm of cervix: Secondary | ICD-10-CM

## 2011-02-24 LAB — COMPREHENSIVE METABOLIC PANEL
ALT: 21 U/L (ref 0–35)
AST: 21 U/L (ref 0–37)
BUN: 21 mg/dL (ref 6–23)
Calcium: 9.3 mg/dL (ref 8.4–10.5)
Chloride: 106 mEq/L (ref 96–112)
Creatinine, Ser: 1 mg/dL (ref 0.4–1.2)
GFR: 61.11 mL/min (ref >60.00)
Total Bilirubin: 0.6 mg/dL (ref 0.3–1.2)

## 2011-02-24 NOTE — Assessment & Plan Note (Addendum)
Problem stable.  Continue current medications and diet appropriate for this condition.  We have reviewed our general long term plan for this problem and also reviewed symptoms and signs that should prompt the patient to call or return to the office. Check lytes/cr today.

## 2011-02-24 NOTE — Assessment & Plan Note (Signed)
Problem stable.  Continue current medications and diet appropriate for this condition.  We have reviewed our general long term plan for this problem and also reviewed symptoms and signs that should prompt the patient to call or return to the office.  

## 2011-02-24 NOTE — Progress Notes (Signed)
Office Note 02/24/2011  CC:  Chief Complaint  Patient presents with  . Establish Care    transfer from Dr. Joni Fears    HPI:  Brenda Matthews is a 76 y.o. White female who is here to transfer care from Dr. Joni Fears, who recently retired from [employer] at [place]. Old records in EPIC/HL were reviewed prior to or during today's visit.  No acute complaints today. Has f/u with ortho very soon for wrist fx surgery done 11/18/10.  Says wrist feels good. Needs to med RFs today.   Reviewed last labs from 05/2010: lipids good, cbc good, TSH normal, CMET normal. She gets bone densitometry and mammograms routinely q2 yrs.  Says anxiety is quiescent lately, says she takes 1 to 3 of the 0.84m tabs per day.   Past Medical History  Diagnosis Date  . Hyperlipidemia   . Hypertension   . Rheumatic fever   . Arthritis   . Anxiety     with panic  . Peripheral edema   . Fracture of radial shaft, left, closed 11/16/10    fell down flight of stairs  . Hx of adenomatous colonic polyps 2002    surveillance colonoscopy 2009, +polypectomy done-tubular adenoma w/out high grade.    . Diverticulosis 2009 colonoscopy  . Osteopenia   . Abnormal EKG approx 2008    Nuclear stress test neg;   . Cataract     bilat; not surgical yet per pt report    Past Surgical History  Procedure Date  . Appendectomy 1966    done during surgery for tubal pregnancy  . Tonsillectomy   . Orif radial fracture 11/18/10    left    Family History  Problem Relation Age of Onset  . Heart disease Mother   . Heart disease Father   . Hypertension Brother     History   Social History  . Marital Status: Married    Spouse Name: N/A    Number of Children: N/A  . Years of Education: N/A   Occupational History  . Not on file.   Social History Main Topics  . Smoking status: Never Smoker   . Smokeless tobacco: Never Used  . Alcohol Use: Yes     occ  . Drug Use: No  . Sexually Active: Not on file   Other Topics  Concern  . 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  Rare alcohol.No drugs.  Exercise: 4 times per week, about 448m    Outpatient Encounter Prescriptions as of 02/24/2011  Medication Sig Dispense Refill  . ALPRAZolam (XANAX) 0.5 MG tablet take 1 tablet by mouth three times a day if needed for STRESS  90 tablet  5  . amLODipine (NORVASC) 5 MG tablet Take 1 tablet (5 mg total) by mouth daily.  90 tablet  3  . Ascorbic Acid (VITAMIN C) 500 MG tablet Take 500 mg by mouth 2 (two) times daily.        . Marland Kitchenspirin 81 MG tablet Take 162 mg by mouth daily.       . Marland Kitchentorvastatin (LIPITOR) 40 MG tablet Take 1 tablet (40 mg total) by mouth daily.  90 tablet  3  . B Complex Vitamins (B COMPLEX-B12 PO) Take by mouth daily.        . calcium-vitamin D (OSCAL WITH D) 500-200 MG-UNIT per tablet Take 1 tablet by mouth daily.        . Cholecalciferol (VITAMIN D3)  400 UNITS CAPS Take by mouth daily.        . Coenzyme Q10 200 MG capsule Take 200 mg by mouth daily.        . furosemide (LASIX) 40 MG tablet Take 1 tablet (40 mg total) by mouth 2 (two) times daily. For blood pressure  180 tablet  3  . losartan (COZAAR) 50 MG tablet Take 1 tablet (50 mg total) by mouth daily.  30 tablet  11  . meloxicam (MOBIC) 15 MG tablet Take 1 tablet (15 mg total) by mouth daily.  90 tablet  1  . Multiple Vitamins-Minerals (CENTRUM SILVER ULTRA WOMENS) TABS Take by mouth.        . Omega-3 Fatty Acids (FISH OIL) 1200 MG CAPS Take by mouth 2 (two) times daily.        Marland Kitchen DISCONTD: azithromycin (ZITHROMAX) 1 G powder Take 1 packet by mouth once.        Marland Kitchen DISCONTD: Eflornithine HCl (VANIQA) 13.9 % cream Apply 2 times daily  30 g  11    No Known Allergies  ROS Review of Systems  Constitutional: Negative for fever and fatigue.  HENT: Negative for congestion and sore throat.   Eyes: Negative for visual disturbance.  Respiratory: Negative for cough.   Cardiovascular: Negative  for chest pain.  Gastrointestinal: Negative for nausea and abdominal pain.  Genitourinary: Negative for dysuria.  Musculoskeletal: Negative for back pain and joint swelling.  Skin: Negative for rash.  Neurological: Negative for weakness and headaches.  Hematological: Negative for adenopathy.    PE; Blood pressure 137/75, pulse 69, temperature 99 F (37.2 C), temperature source Temporal, height 5' 2"  (1.575 m), weight 170 lb (77.111 kg), SpO2 95.00%. Gen: Alert, well appearing.  Patient is oriented to person, place, time, and situation. ENT: Ears: EACs clear, normal epithelium.  TMs with good light reflex and landmarks bilaterally.  Eyes: no injection, icteris, swelling, or exudate.  EOMI, PERRLA.  Right eye with obvious cataract visible.  I can't see one on left. Nose: no drainage or turbinate edema/swelling.  No injection or focal lesion.  Mouth: lips without lesion/swelling.  Oral mucosa pink and moist.  Upper and lower dentures in place. Oropharynx without erythema, exudate, or swelling.  Neck - No masses or thyromegaly or limitation in range of motion. Carotids 2+ and without bruit. CV: RRR, no m/r/g.   LUNGS: CTA bilat, nonlabored resps, good aeration in all lung fields. ABD: soft, NT, ND, BS normal.  No hepatospenomegaly or mass.  No bruits. EXT: trace to 1+ PITTING edema bilat.  Nontender.  No erythema.  Pertinent labs:  None today  ASSESSMENT AND PLAN:   Transfer pt:  ANXIETY Problem stable.  Continue current medications and diet appropriate for this condition.  We have reviewed our general long term plan for this problem and also reviewed symptoms and signs that should prompt the patient to call or return to the office.   HYPERLIPIDEMIA Lab Results  Component Value Date   CHOL 155 05/25/2010   HDL 55.40 05/25/2010   LDLCALC 70 05/25/2010   TRIG 149.0 05/25/2010   CHOLHDL 3 05/25/2010   Repeat FLP 94mo Continue lipitor. AST/ALT today.  HYPERTENSION NEC Problem stable.   Continue current medications and diet appropriate for this condition.  We have reviewed our general long term plan for this problem and also reviewed symptoms and signs that should prompt the patient to call or return to the office. Check lytes/cr today.  Return in about 6 months (around 08/24/2011) for for HTN f/u and pap/pelvic exam.  Also schedule annual medicare wellness visit one yr after last one.

## 2011-02-24 NOTE — Assessment & Plan Note (Signed)
Lab Results  Component Value Date   CHOL 155 05/25/2010   HDL 55.40 05/25/2010   LDLCALC 70 05/25/2010   TRIG 149.0 05/25/2010   CHOLHDL 3 05/25/2010   Repeat FLP 15mo Continue lipitor. AST/ALT today.

## 2011-03-04 ENCOUNTER — Encounter: Payer: Self-pay | Admitting: Family Medicine

## 2011-04-21 ENCOUNTER — Other Ambulatory Visit: Payer: Self-pay | Admitting: Family Medicine

## 2011-04-22 ENCOUNTER — Other Ambulatory Visit: Payer: Self-pay | Admitting: Family Medicine

## 2011-05-24 ENCOUNTER — Ambulatory Visit: Payer: Medicare Other | Admitting: Family Medicine

## 2011-05-27 ENCOUNTER — Other Ambulatory Visit: Payer: Self-pay | Admitting: Family Medicine

## 2011-05-27 NOTE — Telephone Encounter (Signed)
eScribe request for refill on amlodipine Last seen on 02/24/11 Follow up on 06/03/11 for Medicare Wellness (regular follow up not needed until 08/2011.) RX sent

## 2011-06-03 ENCOUNTER — Ambulatory Visit (INDEPENDENT_AMBULATORY_CARE_PROVIDER_SITE_OTHER): Payer: Medicare Other | Admitting: Family Medicine

## 2011-06-03 ENCOUNTER — Encounter: Payer: Self-pay | Admitting: Family Medicine

## 2011-06-03 VITALS — BP 144/80 | HR 76 | Temp 97.4°F | Ht 62.75 in | Wt 168.0 lb

## 2011-06-03 DIAGNOSIS — E785 Hyperlipidemia, unspecified: Secondary | ICD-10-CM

## 2011-06-03 DIAGNOSIS — Z0000 Encounter for general adult medical examination without abnormal findings: Secondary | ICD-10-CM

## 2011-06-03 LAB — LIPID PANEL
Cholesterol: 142 mg/dL (ref 0–200)
HDL: 55.5 mg/dL (ref >39.00)
Total CHOL/HDL Ratio: 3
Triglycerides: 97 mg/dL (ref 0.0–149.0)

## 2011-06-03 LAB — POCT URINALYSIS DIPSTICK
Bilirubin, UA: NEGATIVE
Glucose, UA: NEGATIVE
Spec Grav, UA: 1.015
pH, UA: 6.5

## 2011-06-03 LAB — HEPATIC FUNCTION PANEL
ALT: 22 U/L (ref 0–35)
Bilirubin, Direct: 0.1 mg/dL (ref 0.0–0.3)
Total Bilirubin: 0.8 mg/dL (ref 0.3–1.2)
Total Protein: 7 g/dL (ref 6.0–8.3)

## 2011-06-03 LAB — BASIC METABOLIC PANEL
BUN: 21 mg/dL (ref 6–23)
CO2: 26 mEq/L (ref 19–32)
Chloride: 108 mEq/L (ref 96–112)
Creatinine, Ser: 0.9 mg/dL (ref 0.4–1.2)
Glucose, Bld: 90 mg/dL (ref 70–99)
Potassium: 3.8 mEq/L (ref 3.5–5.1)

## 2011-06-03 NOTE — Progress Notes (Addendum)
Subjective:    Brenda Matthews is a 76 y.o. female who presents for Medicare Annual/Subsequent preventive examination.  Preventive Screening-Counseling & Management  Tobacco History  Smoking status  . Never Smoker   Smokeless tobacco  . Never Used    No acute problems.  Current Problems (verified) Patient Active Problem List  Diagnoses  . HYPERLIPIDEMIA  . ANEMIA  . ANXIETY  . PANIC DISORDER  . HYPERTENSION  . ARTHRITIS  . SCIATICA, BILATERAL  . OSTEOPENIA  . INSOMNIA  . EDEMA  . SACROILIAC STRAIN, ACUTE  . Cervical cancer screening    Medications Prior to Visit Current Outpatient Prescriptions on File Prior to Visit  Medication Sig Dispense Refill  . ALPRAZolam (XANAX) 0.5 MG tablet take 1 tablet by mouth three times a day if needed for STRESS  90 tablet  5  . amLODipine (NORVASC) 10 MG tablet take 1 tablet by mouth once daily  90 tablet  2  . amLODipine (NORVASC) 5 MG tablet Take 1 tablet (5 mg total) by mouth daily.  90 tablet  3  . Ascorbic Acid (VITAMIN C) 500 MG tablet Take 500 mg by mouth 2 (two) times daily.        Marland Kitchen aspirin 81 MG tablet Take 162 mg by mouth daily.       Marland Kitchen atorvastatin (LIPITOR) 40 MG tablet take 1 tablet by mouth once daily  90 tablet  3  . B Complex Vitamins (B COMPLEX-B12 PO) Take by mouth daily.        . calcium-vitamin D (OSCAL WITH D) 500-200 MG-UNIT per tablet Take 1 tablet by mouth daily.        . Cholecalciferol (VITAMIN D3) 400 UNITS CAPS Take by mouth daily.        . Coenzyme Q10 200 MG capsule Take 200 mg by mouth daily.        . furosemide (LASIX) 40 MG tablet Take 1 tablet (40 mg total) by mouth 2 (two) times daily. For blood pressure  180 tablet  3  . losartan (COZAAR) 50 MG tablet Take 1 tablet (50 mg total) by mouth daily.  30 tablet  11  . meloxicam (MOBIC) 15 MG tablet Take 1 tablet (15 mg total) by mouth daily.  90 tablet  1  . Multiple Vitamins-Minerals (CENTRUM SILVER ULTRA WOMENS) TABS Take by mouth.        . Omega-3  Fatty Acids (FISH OIL) 1200 MG CAPS Take by mouth 2 (two) times daily.          Current Medications (verified) Current Outpatient Prescriptions  Medication Sig Dispense Refill  . ALPRAZolam (XANAX) 0.5 MG tablet take 1 tablet by mouth three times a day if needed for STRESS  90 tablet  5  . amLODipine (NORVASC) 10 MG tablet take 1 tablet by mouth once daily  90 tablet  2  . amLODipine (NORVASC) 5 MG tablet Take 1 tablet (5 mg total) by mouth daily.  90 tablet  3  . Ascorbic Acid (VITAMIN C) 500 MG tablet Take 500 mg by mouth 2 (two) times daily.        Marland Kitchen aspirin 81 MG tablet Take 162 mg by mouth daily.       Marland Kitchen atorvastatin (LIPITOR) 40 MG tablet take 1 tablet by mouth once daily  90 tablet  3  . B Complex Vitamins (B COMPLEX-B12 PO) Take by mouth daily.        . calcium-vitamin D (OSCAL WITH D) 500-200 MG-UNIT  per tablet Take 1 tablet by mouth daily.        . Cholecalciferol (VITAMIN D3) 400 UNITS CAPS Take by mouth daily.        . Coenzyme Q10 200 MG capsule Take 200 mg by mouth daily.        . furosemide (LASIX) 40 MG tablet Take 1 tablet (40 mg total) by mouth 2 (two) times daily. For blood pressure  180 tablet  3  . losartan (COZAAR) 50 MG tablet Take 1 tablet (50 mg total) by mouth daily.  30 tablet  11  . meloxicam (MOBIC) 15 MG tablet Take 1 tablet (15 mg total) by mouth daily.  90 tablet  1  . Multiple Vitamins-Minerals (CENTRUM SILVER ULTRA WOMENS) TABS Take by mouth.        . Omega-3 Fatty Acids (FISH OIL) 1200 MG CAPS Take by mouth 2 (two) times daily.         *Pt taking 80m amlodipine qd (no 574mdose)  Allergies (verified) Review of patient's allergies indicates no known allergies.   PAST HISTORY  Family History Family History  Problem Relation Age of Onset  . Heart disease Mother   . Heart disease Father   . Hypertension Brother     Social History History  Substance Use Topics  . Smoking status: Never Smoker   . Smokeless tobacco: Never Used  . Alcohol Use: Yes       occ     Are there smokers in your home (other than you)? No  Risk Factors Current exercise habits: walks 4 times per week, goes up and down stairs in 2 story home.  Dietary issues discussed: none   Cardiac risk factors: advanced age (older than 557or men, 6529or women), dyslipidemia and hypertension.  Depression Screen (Note: if answer to either of the following is "Yes", a more complete depression screening is indicated)   Over the past two weeks, have you felt down, depressed or hopeless? No  Over the past two weeks, have you felt little interest or pleasure in doing things? No  Have you lost interest or pleasure in daily life? No  Do you often feel hopeless? No  Do you cry easily over simple problems? No  Activities of Daily Living In your present state of health, do you have any difficulty performing the following activities?:  Driving? No Managing money?  No Feeding yourself? No Getting from bed to chair? No Climbing a flight of stairs? No Preparing food and eating?: No Bathing or showering? No Getting dressed: No Getting to the toilet? No Using the toilet:No Moving around from place to place: No In the past year have you fallen or had a near fall?:Yes   Are you sexually active?  No  Do you have more than one partner?  No  Hearing Difficulties: No Do you often ask people to speak up or repeat themselves? No Do you experience ringing or noises in your ears? No Do you have difficulty understanding soft or whispered voices? No   Do you feel that you have a problem with memory? No  Do you often misplace items? No  Do you feel safe at home?  Yes  Cognitive Testing  Alert? Yes  Normal Appearance?Yes  Oriented to person? Yes  Place? Yes   Time? Yes  Recall of three objects?  Yes  Can perform simple calculations? Yes  Displays appropriate judgment?Yes  Can read the correct time from a watch face?Yes   Advanced  Directives have been discussed with the patient?  Yes  List the Names of Other Physician/Practitioners you currently use: 1.  [facility] for skin exam. 2. [facility] for annual mammograms 3) [facility]oscopy 4. Dr. Apolonio Schneiders, orthopedist, for her wrist fracture 10/2010  Indicate any recent Medical Services you may have received from other than [facility] providers in the past year (date may be approximate).  Immunization History  Administered Date(s) Administered  . H1N1 01/07/2008  . Influenza Split 11/01/2010  . Influenza Whole 12/27/2006, 11/07/2007, 10/31/2008, 12/29/2009  . Pneumococcal Polysaccharide 11/07/2007  . Td 12/10/2008  . Zoster 04/05/2007    Screening Tests Health Maintenance  Topic Date Due  . Influenza Vaccine  10/18/2011  . Colonoscopy  12/20/2012  . Tetanus/tdap  12/11/2018  . Pneumococcal Polysaccharide Vaccine Age 70 And Over  Completed  . Zostavax  Completed  Last pap smear 11/07/2007 (wnl).  Needs pap q3 yrs.  All answers were reviewed with the patient and necessary referrals were made:  [name], MD   06/03/2011   History reviewed: allergies, current medications, past family history, past medical history, past social history, past surgical history and problem list  Review of Systems A comprehensive review of systems was negative.    Objective:  Has eye MD appt 07/2011 for eval of cataracts.  She wears corrective lenses.   Vision by Snellen chart: :not tested; pt has had eye exam at ophthalmologits within the last year and has corrective lenses, no glaucoma.  She will be undergoing further evalutaion by a cataract specialist soon.,   Body mass index is 30.00 kg/(m^2). BP 144/80  Pulse 76  Temp(Src) 97.4 F (36.3 C) (Temporal)  Ht 5' 2.75" (1.594 m)  Wt 168 lb (76.204 kg)  BMI 30.00 kg/m2  No exam performed today, not indicated as part of annual medicare wellness exam..     Assessment:     Annual medicare wellness visit. No acute problems.      Plan:     During the  course of the visit the patient was educated and counseled about appropriate screening and preventive services including:    Screening electrocardiogram  Diabetes screening Screening urinalysis (these were done/updated today). Plan for next 5 yrs: annual influenza vaccine. Pap smear ASAP this year. Screening for AAA this year.  Diet review for nutrition referral? Yes ____  Not Indicated __x__   Patient Instructions (the written plan) was given to the patient.  Medicare Attestation I have personally reviewed: The patient's medical and social history Their use of alcohol, tobacco or illicit drugs--none Their current medications and supplements--reviewed/confirmed. The patient's functional ability including ADLs,fall risks, home safety risks, cognitive, and hearing and visual impairment--fully functional, without vision or hearing impairment. Diet and physical activities-active, good diet. Evidence for depression or mood disorders-none detected.  The patient's weight, height, BMI, and visual acuity have been recorded in the chart.  I have made referrals, counseling, and provided education to the patient based on review of the above and I have provided the patient with a written personalized care plan for preventive services.     [name], MD   06/03/2011

## 2011-06-05 ENCOUNTER — Encounter: Payer: Self-pay | Admitting: Family Medicine

## 2011-06-08 ENCOUNTER — Other Ambulatory Visit: Payer: Self-pay | Admitting: Family Medicine

## 2011-07-27 ENCOUNTER — Other Ambulatory Visit: Payer: Self-pay | Admitting: Family Medicine

## 2011-07-28 ENCOUNTER — Other Ambulatory Visit: Payer: Self-pay | Admitting: Family Medicine

## 2011-08-25 ENCOUNTER — Encounter: Payer: Self-pay | Admitting: Family Medicine

## 2011-08-25 ENCOUNTER — Encounter: Payer: Medicare Other | Admitting: Family Medicine

## 2011-08-31 ENCOUNTER — Encounter: Payer: Medicare Other | Admitting: Family Medicine

## 2011-09-05 ENCOUNTER — Ambulatory Visit (INDEPENDENT_AMBULATORY_CARE_PROVIDER_SITE_OTHER): Payer: Medicare Other | Admitting: Family Medicine

## 2011-09-05 ENCOUNTER — Encounter: Payer: Self-pay | Admitting: Family Medicine

## 2011-09-05 VITALS — BP 147/78 | HR 72 | Temp 97.9°F | Ht 62.0 in | Wt 170.0 lb

## 2011-09-05 DIAGNOSIS — Z0000 Encounter for general adult medical examination without abnormal findings: Secondary | ICD-10-CM

## 2011-09-05 NOTE — Progress Notes (Signed)
Office Note 09/05/2011  CC:  Chief Complaint  Patient presents with  . Establish Care    Physical    HPI:  Brenda Matthews is a 76 y.o. White female who is here for CPE. Denies acute complaint.  Compliant with all meds.  Increasing activity lately, also recently got on wt watchers and is shooting for 18 lb total wt loss. She had a routine screening mammogram 08/2011 that was normal.  She describes a past hx of one abnormal mammogram and the f/u for this showed that some benign calcifications were stable, so she resumed annual screening mammograms. She has not history of abnormal pap smears.   She needs to contact [facility] so she can set up her f/u colonoscopy.    Past Medical History  Diagnosis Date  . Hyperlipidemia   . Hypertension   . Rheumatic fever   . Arthritis   . Anxiety     with panic  . Peripheral edema   . Fracture of radial shaft, left, closed 11/16/10    fell down flight of stairs  . Hx of adenomatous colonic polyps 2002    surveillance colonoscopy 2009, +polypectomy done-tubular adenoma w/out high grade.  Next colonoscopy due after 12/2012  . Diverticulosis 2009 colonoscopy  . Osteopenia     DEXA 08/2010  . Abnormal EKG approx 2008    Nuclear stress test neg;   . Cataract     s/p surgery--lens implants  . CKD (chronic kidney disease), stage III     HTN, NSAID use, with chronic scarring.    Past Surgical History  Procedure Date  . Appendectomy 1966    done during surgery for tubal pregnancy  . Tonsillectomy   . Orif radial fracture 11/18/10    left; s/p slip on slippery floor and fell  . Cataract extraction w/ intraocular lens implant 2013    bilat    Family History  Problem Relation Age of Onset  . Heart disease Mother   . Heart disease Father   . Hypertension Brother     History   Social History  . Marital Status: Married    Spouse Name: N/A    Number of Children: N/A  . Years of Education: N/A   Occupational History  . Not on file.    Social History Main Topics  . Smoking status: Never Smoker   . Smokeless tobacco: Never Used  . Alcohol Use: Yes     occ  . Drug Use: No  . Sexually Active: Not on file   Other Topics Concern  . 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  Rare alcohol.No drugs.  Exercise: 4 times per week, about 4m.    Outpatient Prescriptions Prior to Visit  Medication Sig Dispense Refill  . ALPRAZolam (XANAX) 0.5 MG tablet take 1 tablet by mouth three times a day if needed for STRESS  90 tablet  5  . amLODipine (NORVASC) 10 MG tablet take 1 tablet by mouth once daily  90 tablet  2  . Ascorbic Acid (VITAMIN C) 500 MG tablet Take 500 mg by mouth 2 (two) times daily.        .Marland Kitchenaspirin 81 MG tablet Take 162 mg by mouth daily.       .Marland Kitchenatorvastatin (LIPITOR) 40 MG tablet take 1 tablet by mouth once daily  90 tablet  3  . B Complex Vitamins (B COMPLEX-B12 PO) Take by mouth daily.        .Marland Kitchen  calcium-vitamin D (OSCAL WITH D) 500-200 MG-UNIT per tablet Take 1 tablet by mouth daily.        . Cholecalciferol (VITAMIN D3) 400 UNITS CAPS Take by mouth daily.        . Coenzyme Q10 200 MG capsule Take 200 mg by mouth daily.        . furosemide (LASIX) 40 MG tablet take 1 tablet by mouth twice a day for blood pressure  180 tablet  3  . losartan (COZAAR) 50 MG tablet Take 1 tablet (50 mg total) by mouth daily.  30 tablet  11  . meloxicam (MOBIC) 15 MG tablet Take 1 tablet (15 mg total) by mouth daily.  90 tablet  1  . Multiple Vitamins-Minerals (CENTRUM SILVER ULTRA WOMENS) TABS Take by mouth.        . Omega-3 Fatty Acids (FISH OIL) 1200 MG CAPS Take by mouth 2 (two) times daily.        . meloxicam (MOBIC) 15 MG tablet TAKE 1 TABLET BY MOUTH ONCE DAILY  90 tablet  3    No Known Allergies  ROS Review of Systems  Constitutional: Negative for fever, chills, appetite change and fatigue.  HENT: Negative for ear pain, congestion, sore throat, neck stiffness  and dental problem.   Eyes: Negative for discharge, redness and visual disturbance.  Respiratory: Negative for cough, chest tightness, shortness of breath and wheezing.   Cardiovascular: Negative for chest pain, palpitations and leg swelling.  Gastrointestinal: Negative for nausea, vomiting, abdominal pain, diarrhea and blood in stool.  Genitourinary: Negative for dysuria, urgency, frequency, hematuria, flank pain and difficulty urinating.  Musculoskeletal: Negative for myalgias, back pain, joint swelling and arthralgias.  Skin: Negative for pallor and rash.  Neurological: Negative for dizziness, speech difficulty, weakness and headaches.  Hematological: Negative for adenopathy. Does not bruise/bleed easily.  Psychiatric/Behavioral: Negative for confusion and disturbed wake/sleep cycle. The patient is not nervous/anxious.     PE; Blood pressure 147/78, pulse 72, temperature 97.9 F (36.6 C), temperature source Temporal, height 5' 2"  (1.575 m), weight 170 lb (77.111 kg), SpO2 94.00%. Gen: Alert, well appearing.  Patient is oriented to person, place, time, and situation. ENT: Ears: EACs clear, normal epithelium.  TMs with good light reflex and landmarks bilaterally.  Eyes: no injection, icteris, swelling, or exudate.  EOMI, PERRLA. Nose: no drainage or turbinate edema/swelling.  No injection or focal lesion.  Mouth: lips without lesion/swelling.  Oral mucosa pink and moist.  Dentition intact and without obvious caries or gingival swelling.  Oropharynx without erythema, exudate, or swelling.  Neck: supple/nontender.  No LAD, mass, or TM.  Carotid pulses 2+ bilaterally, without bruits. CV: RRR, no m/r/g.   LUNGS: CTA bilat, nonlabored resps, good aeration in all lung fields. ABD: soft, NT, ND, BS normal.  No hepatospenomegaly or mass.  No bruits. EXT: no clubbing, cyanosis, or edema.  Neuro: CN 2-12 intact bilaterally, strength 5/5 in proximal and distal upper extremities and lower extremities  bilaterally.  No sensory deficits.  No tremor.  No disdiadochokinesis.  No ataxia.  Upper extremity and lower extremity DTRs symmetric.  No pronator drift. Pelvic exam: vulva normal.  Bimanual exam: cervix palpably normal.  I could not feel any adnexal structures or palpate her uterus with my abdominal hand.  She is nontender.  No mass.  Pertinent labs:  Lab Results  Component Value Date   TSH 0.50 05/25/2010   Lab Results  Component Value Date   WBC 6.9 05/25/2010   HGB  14.3 05/25/2010   HCT 42.7 05/25/2010   MCV 89.5 05/25/2010   PLT 218.0 05/25/2010   Lab Results  Component Value Date   CREATININE 0.9 06/03/2011   BUN 21 06/03/2011   NA 143 06/03/2011   K 3.8 06/03/2011   CL 108 06/03/2011   CO2 26 06/03/2011   Lab Results  Component Value Date   ALT 22 06/03/2011   AST 23 06/03/2011   ALKPHOS 93 06/03/2011   BILITOT 0.8 06/03/2011   Lab Results  Component Value Date   CHOL 142 06/03/2011   Lab Results  Component Value Date   HDL 55.50 06/03/2011   Lab Results  Component Value Date   LDLCALC 67 06/03/2011   Lab Results  Component Value Date   TRIG 97.0 06/03/2011   Lab Results  Component Value Date   CHOLHDL 3 06/03/2011   No results found for this basename: PSA       ASSESSMENT AND PLAN:   Routine general medical examination at a health care facility Reviewed age and gender appropriate health maintenance issues (prudent diet, regular exercise, health risks of tobacco and excessive alcohol, use of seatbelts, fire alarms in home, use of sunscreen).  Also reviewed age and gender appropriate health screening as well as vaccine recommendations.  Mammogram this month was normal. Will arrange for medicare screening: Abd aortic aneurism screening with aortic u/s--ordered today. Pelvic exam normal today (bimanual).  Given her history of NO ABNORMAL pap smears in the past + her age of 71 yrs, she no longer needs routine screening pap smears so this was not done today.  I explained this  to the patient today. She will be contacting her GI MD soon so that she can discuss doing another colonoscopy or not. She goes to the dermatologist annually for skin cancer screening.    FOLLOW UP:  Return in about 6 months (around 03/07/2012) for f/u HTN.

## 2011-09-05 NOTE — Assessment & Plan Note (Signed)
Reviewed age and gender appropriate health maintenance issues (prudent diet, regular exercise, health risks of tobacco and excessive alcohol, use of seatbelts, fire alarms in home, use of sunscreen).  Also reviewed age and gender appropriate health screening as well as vaccine recommendations.  Mammogram this month was normal. Will arrange for medicare screening: Abd aortic aneurism screening with aortic u/s--ordered today. Pelvic exam normal today (bimanual).  Given her history of NO ABNORMAL pap smears in the past + her age of 89 yrs, she no longer needs routine screening pap smears so this was not done today.  I explained this to the patient today. She will be contacting her GI MD soon so that she can discuss doing another colonoscopy or not. She goes to the dermatologist annually for skin cancer screening.

## 2011-09-09 NOTE — Addendum Note (Signed)
Addended by: [name] on: 09/09/2011 02:10 PM   Modules accepted: Orders

## 2011-09-13 ENCOUNTER — Other Ambulatory Visit: Payer: Self-pay | Admitting: Family Medicine

## 2011-09-14 ENCOUNTER — Telehealth: Payer: Self-pay | Admitting: Family Medicine

## 2011-09-14 NOTE — Telephone Encounter (Signed)
I called the patient to advise that Medicare would not cover aorotic [place] 100%. Patient states she would like to precede with the exam since she feels she is at a high risk for a stroke since her mother had 5 of them. She would like [facility] to give her an estimate on how much they feel Medicare will cover.

## 2011-09-21 NOTE — Telephone Encounter (Signed)
Pls give her the appropriate [facility] phone number so she can ask them this question about price, etc.--thx

## 2011-09-22 NOTE — Telephone Encounter (Signed)
Left a detailed mess on patient's ans mach. I left [facility]'s phone # so that she may get a price. Advised patient if she would like to proceed with [place] she can contact our office.

## 2011-11-17 ENCOUNTER — Other Ambulatory Visit: Payer: Self-pay | Admitting: Family Medicine

## 2011-11-18 NOTE — Telephone Encounter (Signed)
eScribe request for refill on ALPRAZOLAM  Last filled - 04/21/11, #90 X 5 Last seen on - 09/05/11 Follow up - 6 MONTHS, 03/03/11 RX printed and signed by Dr. Anitra Lauth. [facility] [phone]

## 2012-02-22 ENCOUNTER — Other Ambulatory Visit: Payer: Self-pay | Admitting: Family Medicine

## 2012-02-22 NOTE — Telephone Encounter (Signed)
eScribe request for refill on AMLODIPINE Last filled - 05/27/11, #90 X 2 Last seen on - 09/05/11 Follow up - 03/07/12 RX sent

## 2012-03-05 ENCOUNTER — Other Ambulatory Visit: Payer: Self-pay | Admitting: Family Medicine

## 2012-03-06 NOTE — Telephone Encounter (Signed)
eScribe request for refill on COZAAR Last filled - 08/13/10, #30 X 11 Last seen on - 09/05/11 Follow up - 3/12/114 RX sent

## 2012-03-07 ENCOUNTER — Ambulatory Visit: Payer: Medicare Other | Admitting: Family Medicine

## 2012-03-28 ENCOUNTER — Ambulatory Visit: Payer: Medicare Other | Admitting: Family Medicine

## 2012-04-10 ENCOUNTER — Other Ambulatory Visit: Payer: Self-pay | Admitting: Family Medicine

## 2012-04-10 NOTE — Telephone Encounter (Signed)
Rx request to pharmacy/SLS *Office Visit Required Prior to Future Refills*

## 2012-04-18 ENCOUNTER — Ambulatory Visit: Payer: Medicare Other | Admitting: Family Medicine

## 2012-05-02 ENCOUNTER — Encounter: Payer: Self-pay | Admitting: Family Medicine

## 2012-05-02 ENCOUNTER — Ambulatory Visit (INDEPENDENT_AMBULATORY_CARE_PROVIDER_SITE_OTHER): Payer: Medicare Other | Admitting: Family Medicine

## 2012-05-02 VITALS — BP 146/90 | HR 68 | Resp 16 | Ht 62.0 in | Wt 168.0 lb

## 2012-05-02 DIAGNOSIS — I10 Essential (primary) hypertension: Secondary | ICD-10-CM

## 2012-05-02 DIAGNOSIS — E785 Hyperlipidemia, unspecified: Secondary | ICD-10-CM

## 2012-05-02 DIAGNOSIS — F411 Generalized anxiety disorder: Secondary | ICD-10-CM

## 2012-05-02 LAB — BASIC METABOLIC PANEL
BUN: 21 mg/dL (ref 6–23)
Chloride: 104 mEq/L (ref 96–112)
Creatinine, Ser: 1 mg/dL (ref 0.4–1.2)
GFR: 58.77 mL/min — ABNORMAL LOW (ref >60.00)
Glucose, Bld: 75 mg/dL (ref 70–99)

## 2012-05-02 LAB — LIPID PANEL
Cholesterol: 142 mg/dL (ref 0–200)
HDL: 56.4 mg/dL (ref >39.00)
LDL Cholesterol: 68 mg/dL (ref 0–99)
Total CHOL/HDL Ratio: 3
Triglycerides: 88 mg/dL (ref 0.0–149.0)
VLDL: 17.6 mg/dL (ref 0.0–40.0)

## 2012-05-02 NOTE — Assessment & Plan Note (Signed)
Stable, but needs to do more frequent home monitoring, so we discussed this today. Continue current meds for now.  Check BMET today.

## 2012-05-02 NOTE — Assessment & Plan Note (Signed)
Alprazolam is working well for her and she is not over-using this med. Continue this med.  No new rx given today.

## 2012-05-02 NOTE — Assessment & Plan Note (Signed)
Stable. Recheck FLP today.

## 2012-05-02 NOTE — Progress Notes (Signed)
OFFICE NOTE  05/02/2012  CC:  Chief Complaint  Patient presents with  . Follow-up    HTN-pt is fasting, last labs 05/2011     HPI: Patient is a 77 y.o. Caucasian female who is here for 8 mo f/u HTN, hyperlipidemia. She occ monitors home bp and gets 782U systolic.  She pays no attention to her diastolics or her HR's. She says she doesn't exercise enough. Takes cholesterol med faithfully.   Has anxiety, has to take alprazolam during the daytime sometimes and it works well.  She takes it every night. She is fasting today.  Pertinent PMH:  Past Medical History  Diagnosis Date  . Hyperlipidemia   . Hypertension   . Rheumatic fever   . Arthritis   . Anxiety     with panic  . Peripheral edema   . Fracture of radial shaft, left, closed 11/16/10    fell down flight of stairs  . Hx of adenomatous colonic polyps 2002    surveillance colonoscopy 2009, +polypectomy done-tubular adenoma w/out high grade.  Next colonoscopy due after 12/2012  . Diverticulosis 2009 colonoscopy  . Osteopenia     DEXA 08/2010  . Abnormal EKG approx 2008    Nuclear stress test neg;   . Cataract     s/p surgery--lens implants  . CKD (chronic kidney disease), stage III     HTN, NSAID use, with chronic scarring.    MEDS:  Outpatient Prescriptions Prior to Visit  Medication Sig Dispense Refill  . ALPRAZolam (XANAX) 0.5 MG tablet take 1 tablet by mouth three times a day if needed for STRESS  90 tablet  5  . amLODipine (NORVASC) 10 MG tablet take 1 tablet by mouth once daily  90 tablet  1  . Ascorbic Acid (VITAMIN C) 500 MG tablet Take 500 mg by mouth 2 (two) times daily.        Marland Kitchen aspirin 81 MG tablet Take 162 mg by mouth daily.       Marland Kitchen atorvastatin (LIPITOR) 40 MG tablet take 1 tablet by mouth once daily  90 tablet  3  . B Complex Vitamins (B COMPLEX-B12 PO) Take by mouth daily.        . calcium-vitamin D (OSCAL WITH D) 500-200 MG-UNIT per tablet Take 1 tablet by mouth daily.        . Cholecalciferol  (VITAMIN D3) 400 UNITS CAPS Take by mouth daily.        . Coenzyme Q10 200 MG capsule Take 200 mg by mouth daily.        . furosemide (LASIX) 40 MG tablet take 1 tablet by mouth twice a day for blood pressure  180 tablet  3  . losartan (COZAAR) 50 MG tablet take 1 tablet by mouth once daily  30 tablet  0  . meloxicam (MOBIC) 15 MG tablet Take 1 tablet (15 mg total) by mouth daily.  90 tablet  1  . Multiple Vitamins-Minerals (CENTRUM SILVER ULTRA WOMENS) TABS Take by mouth.        . Omega-3 Fatty Acids (FISH OIL) 1200 MG CAPS Take by mouth 2 (two) times daily.         No facility-administered medications prior to visit.    PE: Blood pressure 146/90, pulse 68, resp. rate 16, height 5' 2"  (1.575 m), weight 168 lb (76.204 kg). Gen: Alert, well appearing.  Patient is oriented to person, place, time, and situation. Neck: supple/nontender.  No LAD, mass, or TM.  Carotid pulses 2+  bilaterally, without bruits. CV: RRR, no m/r/g.   LUNGS: CTA bilat, nonlabored resps, good aeration in all lung fields. EXT: 1+ pitting edema bilat, no clubbing or cyanosis.  IMPRESSION AND PLAN:  HYPERTENSION Stable, but needs to do more frequent home monitoring, so we discussed this today. Continue current meds for now.  Check BMET today.  HYPERLIPIDEMIA Stable. Recheck FLP today.  ANXIETY Alprazolam is working well for her and she is not over-using this med. Continue this med.  No new rx given today.   An After Visit Summary was printed and given to the patient.   FOLLOW UP:  5mo

## 2012-05-11 ENCOUNTER — Other Ambulatory Visit: Payer: Self-pay | Admitting: Family Medicine

## 2012-05-11 NOTE — Telephone Encounter (Signed)
Rx request to pharmacy/SLS  

## 2012-05-27 ENCOUNTER — Other Ambulatory Visit: Payer: Self-pay | Admitting: Family Medicine

## 2012-05-28 NOTE — Telephone Encounter (Signed)
Refill request for Xanax Last filled by MD on -11/18/11 #90 x5 Last seen-05/02/12 F/U-11/01/12 Please advise refill?

## 2012-05-30 ENCOUNTER — Other Ambulatory Visit: Payer: Self-pay | Admitting: Family Medicine

## 2012-05-31 NOTE — Telephone Encounter (Signed)
Rx request to pharmacy; per pharmacy did not receive Escript sent on 05.10.14/SLS

## 2012-06-04 ENCOUNTER — Telehealth: Payer: Self-pay | Admitting: *Deleted

## 2012-06-04 MED ORDER — MELOXICAM 15 MG PO TABS: 15.0000 mg | ORAL_TABLET | Freq: Every day | ORAL | Status: DC

## 2012-06-04 NOTE — Telephone Encounter (Signed)
eScribe request for refill on MELOXICAM Last filled - 5.23.13, #90x3 Last seen on - 4.16.14  Follow up - 6 months RX sent 90x1/SLS

## 2012-08-16 ENCOUNTER — Emergency Department (HOSPITAL_COMMUNITY)
Admission: EM | Admit: 2012-08-16 | Discharge: 2012-08-17 | Disposition: A | Discharge status: Home / Self Care | Payer: Medicare Other | Attending: Emergency Medicine | Admitting: Emergency Medicine

## 2012-08-16 ENCOUNTER — Encounter (HOSPITAL_COMMUNITY): Payer: Self-pay | Admitting: Adult Health

## 2012-08-16 DIAGNOSIS — Z79899 Other long term (current) drug therapy: Secondary | ICD-10-CM | POA: Insufficient documentation

## 2012-08-16 DIAGNOSIS — Z8719 Personal history of other diseases of the digestive system: Secondary | ICD-10-CM | POA: Insufficient documentation

## 2012-08-16 DIAGNOSIS — Z8781 Personal history of (healed) traumatic fracture: Secondary | ICD-10-CM | POA: Insufficient documentation

## 2012-08-16 DIAGNOSIS — R21 Rash and other nonspecific skin eruption: Secondary | ICD-10-CM | POA: Insufficient documentation

## 2012-08-16 DIAGNOSIS — M899 Disorder of bone, unspecified: Secondary | ICD-10-CM | POA: Insufficient documentation

## 2012-08-16 DIAGNOSIS — M949 Disorder of cartilage, unspecified: Secondary | ICD-10-CM | POA: Insufficient documentation

## 2012-08-16 DIAGNOSIS — Z860100 Personal history of colon polyps, unspecified: Secondary | ICD-10-CM | POA: Insufficient documentation

## 2012-08-16 DIAGNOSIS — I10 Essential (primary) hypertension: Secondary | ICD-10-CM | POA: Insufficient documentation

## 2012-08-16 DIAGNOSIS — F410 Panic disorder [episodic paroxysmal anxiety] without agoraphobia: Secondary | ICD-10-CM | POA: Insufficient documentation

## 2012-08-16 DIAGNOSIS — Z8669 Personal history of other diseases of the nervous system and sense organs: Secondary | ICD-10-CM | POA: Insufficient documentation

## 2012-08-16 DIAGNOSIS — Z8619 Personal history of other infectious and parasitic diseases: Secondary | ICD-10-CM | POA: Insufficient documentation

## 2012-08-16 DIAGNOSIS — Z7982 Long term (current) use of aspirin: Secondary | ICD-10-CM | POA: Insufficient documentation

## 2012-08-16 DIAGNOSIS — Z8739 Personal history of other diseases of the musculoskeletal system and connective tissue: Secondary | ICD-10-CM | POA: Insufficient documentation

## 2012-08-16 DIAGNOSIS — E785 Hyperlipidemia, unspecified: Secondary | ICD-10-CM | POA: Insufficient documentation

## 2012-08-16 NOTE — ED Notes (Signed)
Presents with urticaria to bilateral upper extremities and trunk and bilateral legs that began 2 hours ago while eating dinner. Denies SOB, denies throat tightness. Respirations even and unlabored. Took 25 mg of benadryl and 2 omeprazoles at home. VSS

## 2012-08-16 NOTE — ED Provider Notes (Signed)
CSN: [id number]     Arrival date & time 08/16/12  2238 History  This chart was scribed for non-physician practitioner Monico Blitz, PA-C, working with Wynetta Fines, MD, by Neta Ehlers, ED Scribe. This patient was seen in room TR05C/TR05C and the patient's care was started at 11:47 PM.   First MD Initiated Contact with Patient 08/16/12 2332     Chief Complaint  Patient presents with  . Allergic Reaction    Patient is a 77 y.o. female presenting with allergic reaction. The history is provided by the patient. No language interpreter was used.  Allergic Reaction  HPI Comments: Brenda Matthews is a 77 y.o. female who presents to the Emergency Department complaining of a sudden-onset rash to her upper and lower extremities, trunk, and back which began approximately 7 hours ago.  She treated the rash with benadryl and omeprazole at home, but with little resolution. The pt states that the rash does not itch and is not painful. She denies experiencing any fever. She also denies any new medications, soaps, laundry detergents, or anything else that she can recall. The pt has been working in her garage recently.  She does not have a h/o DM.   Past Medical History  Diagnosis Date  . Hyperlipidemia   . Hypertension   . Rheumatic fever   . Arthritis   . Anxiety     with panic  . Peripheral edema   . Fracture of radial shaft, left, closed 11/16/10    fell down flight of stairs  . Hx of adenomatous colonic polyps 2002    surveillance colonoscopy 2009, +polypectomy done-tubular adenoma w/out high grade.  Next colonoscopy due after 12/2012  . Diverticulosis 2009 colonoscopy  . Osteopenia     DEXA 08/2010  . Abnormal EKG approx 2008    Nuclear stress test neg;   . Cataract     s/p surgery--lens implants   Past Surgical History  Procedure Laterality Date  . Appendectomy  1966    done during surgery for tubal pregnancy  . Tonsillectomy    . Orif radial fracture  11/18/10    left; s/p slip on  slippery floor and fell  . Cataract extraction w/ intraocular lens implant  2013    bilat   Family History  Problem Relation Age of Onset  . Heart disease Mother   . Heart disease Father   . Hypertension Brother    History  Substance Use Topics  . Smoking status: Never Smoker   . Smokeless tobacco: Never Used  . Alcohol Use: Yes     Comment: occ   No OB history provided.  Review of Systems 10 systems reviewed and found to be negative, except as noted in the HPI   Allergies  Review of patient's allergies indicates no known allergies.  Home Medications   Current Outpatient Rx  Name  Route  Sig  Dispense  Refill  . ALPRAZolam (XANAX) 0.5 MG tablet      take 1 tablet by mouth three times a day if needed for STRESS   90 tablet   5   . amLODipine (NORVASC) 10 MG tablet      take 1 tablet by mouth once daily   90 tablet   1   . Ascorbic Acid (VITAMIN C) 500 MG tablet   Oral   Take 500 mg by mouth 2 (two) times daily.           Marland Kitchen aspirin 81 MG tablet  Oral   Take 162 mg by mouth daily.          Marland Kitchen atorvastatin (LIPITOR) 40 MG tablet      take 1 tablet by mouth once daily   90 tablet   3   . B Complex Vitamins (B COMPLEX-B12 PO)   Oral   Take by mouth daily.           . calcium-vitamin D (OSCAL WITH D) 500-200 MG-UNIT per tablet   Oral   Take 1 tablet by mouth daily.           . carboxymethylcellulose (REFRESH PLUS) 0.5 % SOLN   Both Eyes   Place 2 drops into both eyes daily.         . Cholecalciferol (VITAMIN D3) 400 UNITS CAPS   Oral   Take by mouth daily.           . Coenzyme Q10 200 MG capsule   Oral   Take 200 mg by mouth daily.           . diphenhydrAMINE (BENADRYL) 25 MG tablet   Oral   Take 25 mg by mouth every 6 (six) hours as needed for itching or allergies.         . furosemide (LASIX) 40 MG tablet   Oral   Take 40 mg by mouth daily.         Marland Kitchen losartan (COZAAR) 50 MG tablet      take 1 tablet by mouth once daily  . NEED OFFICE VISIT FOR FUTURE REFILLS.   30 tablet   5   . meloxicam (MOBIC) 15 MG tablet   Oral   Take 1 tablet (15 mg total) by mouth daily.   90 tablet   1   . Multiple Vitamins-Minerals (CENTRUM SILVER ULTRA WOMENS) TABS   Oral   Take by mouth.           . Omega-3 Fatty Acids (FISH OIL) 1200 MG CAPS   Oral   Take by mouth 2 (two) times daily.           . sodium chloride (OCEAN) 0.65 % nasal spray   Nasal   Place 1 spray into the nose daily.         . valACYclovir (VALTREX) 1000 MG tablet   Oral   Take 1,000 mg by mouth daily as needed (fever blister). As directed.          Triage Vitals: BP 172/84  Pulse 86  Temp(Src) 97.9 F (36.6 C) (Oral)  Resp 16  SpO2 97%  Physical Exam  Nursing note and vitals reviewed. Constitutional: She is oriented to person, place, and time. She appears well-developed and well-nourished. No distress.  HENT:  Head: Normocephalic.  Mouth/Throat: Oropharynx is clear and moist.  Eyes: Conjunctivae and EOM are normal. Pupils are equal, round, and reactive to light.  Neck:  Patient has full range of motion to neck. No tenderness to deep palpation of the posterior cervical spine. Patient can flex chin to chest with no pain.  Cardiovascular: Normal rate.   Pulmonary/Chest: Effort normal and breath sounds normal. No stridor. No respiratory distress. She has no wheezes. She has no rales. She exhibits no tenderness.  Abdominal: Soft.  Musculoskeletal: Normal range of motion.  Neurological: She is alert and oriented to person, place, and time.  Skin:  Patient has-like rash to the upper anterior and posterior torso also affecting the bilateral upper extremities, no excoriations, no warmth. It  is blanchable.  Patient has petechial rash to the bilateral lower extremities affecting the soles of her feet.  No lesions on the mucous membranes.  Psychiatric: She has a normal mood and affect.    ED Course   Procedures (including critical  care time)  Labs Reviewed - No data to display No results found. No diagnosis found.  MDM  MDM Number of Diagnoses or Management Options  Filed Vitals:   08/16/12 2243  BP: 172/84  Pulse: 86  Temp: 97.9 F (36.6 C)  TempSrc: Oral  Resp: 16  SpO2: 97%     Brenda Matthews is a 77 y.o. female with asymptomatic rash starting 2 hours ago. No new environmental exposures. No respiratory symptoms. Rash is consistent with allergic urticaria on the upper extremities however the lower extremities show a petechial rash affecting the soles of the foot. Patient will be treated for [facility] spotted fever. Do not feel her illness is strongly consistent with this however out of an abundance of caution we'll start her on doxycycline. I have also advised her to continue with the omeprazole and Benadryl.   Pt is hemodynamically stable, appropriate for, and amenable to discharge at this time. Pt verbalized understanding and agrees with care plan. All questions answered. Outpatient follow-up and specific return precautions discussed.    Discharge Medication List as of 08/17/2012 12:31 AM    START taking these medications   Details  doxycycline (VIBRAMYCIN) 100 MG capsule Take 1 capsule (100 mg total) by mouth 2 (two) times daily., Starting 08/17/2012, Until Discontinued, Print        I personally performed the services described in this documentation, which was scribed in my presence. The recorded information has been reviewed and is accurate.  Note: Portions of this report may have been transcribed using voice recognition software. Every effort was made to ensure accuracy; however, inadvertent computerized transcription errors may be present    Monico Blitz, PA-C 08/17/12 [phone]

## 2012-08-17 ENCOUNTER — Telehealth: Payer: Self-pay | Admitting: Family Medicine

## 2012-08-17 MED ORDER — DOXYCYCLINE HYCLATE 100 MG PO CAPS: 100.0000 mg | ORAL_CAPSULE | Freq: Two times a day (BID) | ORAL | Status: DC

## 2012-08-17 MED ORDER — DOXYCYCLINE HYCLATE 100 MG PO TABS
100.0000 mg | ORAL_TABLET | Freq: Once | ORAL | Status: AC
  Administered 2012-08-17: 100 mg via ORAL
  Filled 2012-08-17: qty 1

## 2012-08-17 NOTE — ED Provider Notes (Signed)
Medical screening examination/treatment/procedure(s) were conducted as a shared visit with non-physician practitioner(s) and myself.  I personally evaluated the patient during the encounter  Generalized maculopapular erythematous rash with partial sparing of the palms and soles. Rash is nonpruritic and non-urticarial. Its appearance is suspicious for a vasculitic rash such as [facility] spotted fever. We will treat for [facility] spotted fever but refer for biopsy and should it not clear with treatment.  Wynetta Fines, MD 08/17/12 [phone]

## 2012-08-17 NOTE — Telephone Encounter (Signed)
Please advise 

## 2012-08-17 NOTE — Telephone Encounter (Signed)
Patient went to [facility] ER last night. Patient states the rash looks a "little better" this morning. She is taking an antibiotic & benadryl. She will make an OV next week if the rash is unimproved.  Patient said that at her visit they mentioned that she has kidney disease noted in her chart. Please advise. Patient also wants to discuss her last lab results. She got a message stating they were stable. She would like to know if the results were good. Please contact patient.

## 2012-08-20 NOTE — Telephone Encounter (Signed)
Pls notify pt that there was a notation in th past medical history section of her chart that must have been placed in the wrong chart by mistake.  She does have some NORMAL decrease in her kidney function that is appropriate for her age and for having long term high blood pressure.  The comment in [place] was taken out.--thx

## 2012-08-20 NOTE — Telephone Encounter (Signed)
Patient aware.

## 2012-08-23 ENCOUNTER — Other Ambulatory Visit: Payer: Self-pay | Admitting: Family Medicine

## 2012-08-29 ENCOUNTER — Other Ambulatory Visit: Payer: Self-pay | Admitting: Family Medicine

## 2012-08-31 ENCOUNTER — Other Ambulatory Visit: Payer: Self-pay | Admitting: *Deleted

## 2012-08-31 MED ORDER — FUROSEMIDE 40 MG PO TABS: 40.0000 mg | ORAL_TABLET | Freq: Two times a day (BID) | ORAL | Status: DC

## 2012-08-31 MED ORDER — FUROSEMIDE 40 MG PO TABS: 40.0000 mg | ORAL_TABLET | Freq: Every day | ORAL | Status: DC

## 2012-11-01 ENCOUNTER — Ambulatory Visit: Payer: Medicare Other | Admitting: Family Medicine

## 2012-11-02 ENCOUNTER — Ambulatory Visit: Payer: Medicare Other | Admitting: Family Medicine

## 2012-11-07 ENCOUNTER — Encounter: Payer: Self-pay | Admitting: Internal Medicine

## 2012-11-16 ENCOUNTER — Ambulatory Visit: Payer: Medicare Other | Admitting: Family Medicine

## 2012-11-20 ENCOUNTER — Other Ambulatory Visit: Payer: Self-pay | Admitting: Family Medicine

## 2012-11-22 ENCOUNTER — Other Ambulatory Visit: Payer: Self-pay | Admitting: Family Medicine

## 2012-11-25 ENCOUNTER — Other Ambulatory Visit: Payer: Self-pay | Admitting: Family Medicine

## 2012-11-26 NOTE — Telephone Encounter (Signed)
Patient requesting refill on mobic.  Patient last seen 05/02/12, pt canceled all other appointments.  Medication last sent in on 06/04/12 x 1 refill.  Please advise refill.

## 2012-11-30 ENCOUNTER — Encounter: Payer: Self-pay | Admitting: Family Medicine

## 2012-11-30 ENCOUNTER — Ambulatory Visit (INDEPENDENT_AMBULATORY_CARE_PROVIDER_SITE_OTHER): Payer: Medicare Other | Admitting: Family Medicine

## 2012-11-30 VITALS — BP 160/91 | HR 75 | Temp 97.8°F | Resp 18 | Ht 62.0 in | Wt 168.0 lb

## 2012-11-30 DIAGNOSIS — I10 Essential (primary) hypertension: Secondary | ICD-10-CM

## 2012-11-30 DIAGNOSIS — Z86010 Personal history of colonic polyps: Secondary | ICD-10-CM

## 2012-11-30 DIAGNOSIS — M8580 Other specified disorders of bone density and structure, unspecified site: Secondary | ICD-10-CM

## 2012-11-30 DIAGNOSIS — N182 Chronic kidney disease, stage 2 (mild): Secondary | ICD-10-CM

## 2012-11-30 DIAGNOSIS — M899 Disorder of bone, unspecified: Secondary | ICD-10-CM

## 2012-11-30 DIAGNOSIS — F411 Generalized anxiety disorder: Secondary | ICD-10-CM

## 2012-11-30 NOTE — Progress Notes (Addendum)
OFFICE NOTE  12/04/2012  CC:  Chief Complaint  Patient presents with  . Follow-up     HPI: Patient is a 77 y.o. Caucasian female who is here for 6 mo f/u HTN. Feeling well.  Has occ brief periods of panic out of the blue--slightly lightheaded. Uses xanax prn--sounds like 2-3 times a day. Not monitoring bp at home. Pt reports compliance with all scheduled meds. She asks about recheck of bone density testing---was last done 2 yrs ago and showed osteopenia.  ROS: no focal weakness, no dysarthria, no HAs, no SOB or CP.   Pertinent PMH:  Past Medical History  Diagnosis Date  . Hyperlipidemia   . Hypertension   . Rheumatic fever   . Arthritis   . Anxiety     with panic  . Peripheral edema   . Fracture of radial shaft, left, closed 11/16/10    fell down flight of stairs  . Hx of adenomatous colonic polyps 2002    surveillance colonoscopy 2009, +polypectomy done-tubular adenoma w/out high grade.  Next colonoscopy due after 12/2012  . Diverticulosis 2009 colonoscopy  . Osteopenia     DEXA 08/2010  . Abnormal EKG approx 2008    Nuclear stress test neg;   . Cataract     s/p surgery--lens implants  . Chronic renal insufficiency, stage II (mild) 12/04/2012   Past Surgical History  Procedure Laterality Date  . Appendectomy  1966    done during surgery for tubal pregnancy  . Tonsillectomy    . Orif radial fracture  11/18/10    left; s/p slip on slippery floor and fell  . Cataract extraction w/ intraocular lens implant  2013    bilat   History   Social History Narrative   Married, 2 sons.   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   No tobacco.  Rare alcohol.   No drugs.  Exercise: 4 times per week, about 9m.    MEDS:  Outpatient Prescriptions Prior to Visit  Medication Sig Dispense Refill  . ALPRAZolam (XANAX) 0.5 MG tablet take 1 tablet by mouth three times a day if needed for STRESS  90 tablet  5  . amLODipine (NORVASC) 10 MG tablet take 1 tablet by mouth once daily  90  tablet  0  . Ascorbic Acid (VITAMIN C) 500 MG tablet Take 500 mg by mouth 2 (two) times daily.        .Marland Kitchenaspirin 81 MG tablet Take 162 mg by mouth daily.       .Marland Kitchenatorvastatin (LIPITOR) 40 MG tablet take 1 tablet by mouth once daily  90 tablet  3  . B Complex Vitamins (B COMPLEX-B12 PO) Take by mouth daily.        . calcium-vitamin D (OSCAL WITH D) 500-200 MG-UNIT per tablet Take 1 tablet by mouth daily.        . Cholecalciferol (VITAMIN D3) 400 UNITS CAPS Take by mouth daily.        . Coenzyme Q10 200 MG capsule Take 200 mg by mouth daily.        . furosemide (LASIX) 40 MG tablet Take 1 tablet (40 mg total) by mouth 2 (two) times daily.  60 tablet  0  . losartan (COZAAR) 50 MG tablet take 1 tablet by mouth once daily NEEDS OFFICE VISIT  30 tablet  3  . meloxicam (MOBIC) 15 MG tablet take 1 tablet by mouth once daily  90 tablet  1  . Multiple Vitamins-Minerals (CENTRUM SILVER ULTRA WOMENS)  TABS Take by mouth.        . Omega-3 Fatty Acids (FISH OIL) 1200 MG CAPS Take by mouth 2 (two) times daily.        . sodium chloride (OCEAN) 0.65 % nasal spray Place 1 spray into the nose daily.      . carboxymethylcellulose (REFRESH PLUS) 0.5 % SOLN Place 2 drops into both eyes daily.      . valACYclovir (VALTREX) 1000 MG tablet Take 1,000 mg by mouth daily as needed (fever blister). As directed.      . diphenhydrAMINE (BENADRYL) 25 MG tablet Take 25 mg by mouth every 6 (six) hours as needed for itching or allergies.      Marland Kitchen doxycycline (VIBRAMYCIN) 100 MG capsule Take 1 capsule (100 mg total) by mouth 2 (two) times daily.  20 capsule  0   No facility-administered medications prior to visit.  Not taking doxycycline  PE: Blood pressure 160/91, pulse 75, temperature 97.8 F (36.6 C), temperature source Temporal, resp. rate 18, height 5' 2"  (1.575 m), weight 168 lb (76.204 kg), SpO2 96.00%. Manual recheck of bp in left arm was 158/90 Gen: Alert, well appearing.  Patient is oriented to person, place, time, and  situation. ENT: Eyes: no injection, icteris, swelling, or exudate.  EOMI, PERRLA. Nose: no drainage or turbinate edema/swelling.  No injection or focal lesion.  Mouth: lips without lesion/swelling.  Oral mucosa pink and moist.  Dentition intact and without obvious caries or gingival swelling.  Oropharynx without erythema, exudate, or swelling.  Neck - No masses or thyromegaly or limitation in range of motion CV: RRR, no m/r/g.   LUNGS: CTA bilat, nonlabored resps, good aeration in all lung fields.  LABS: none today  IMPRESSION AND PLAN:  HYPERTENSION Continue current meds, but encouraged pt to begin/resume home bp monitoring.  BMET today. Pt instructions: Monitor bp at home a few times a week: call our office if you get persistent bp on top of >150 or >95 on bottom.   ANXIETY With history of panic. Stable. Continue xanax prn.  OSTEOPENIA Recheck DEXA--this was ordered today.  Hx of adenomatous colonic polyps Surveillance colonoscopy 2009, +polypectomy done-tubular adenoma w/out high grade.  Next colonoscopy due after 12/2012. She has received a recall letter and will contact [facility] to set up procedure with Dr. Henrene Pastor.  Chronic renal insufficiency, stage II (mild) Est CrCl about 60. BMET today.   An After Visit Summary was printed and given to the patient.  FOLLOW UP: 6 mo

## 2012-11-30 NOTE — Patient Instructions (Signed)
Monitor bp at home a few times a week: call our office if you get persistent bp on top of >150 or >95 on bottom.

## 2012-12-04 ENCOUNTER — Telehealth: Payer: Self-pay | Admitting: Family Medicine

## 2012-12-04 ENCOUNTER — Encounter: Payer: Self-pay | Admitting: Family Medicine

## 2012-12-04 DIAGNOSIS — N182 Chronic kidney disease, stage 2 (mild): Secondary | ICD-10-CM | POA: Insufficient documentation

## 2012-12-04 DIAGNOSIS — Z86010 Personal history of colonic polyps: Secondary | ICD-10-CM | POA: Insufficient documentation

## 2012-12-04 DIAGNOSIS — N183 Chronic kidney disease, stage 3 unspecified: Secondary | ICD-10-CM

## 2012-12-04 DIAGNOSIS — N1832 Chronic kidney disease, stage 3b: Secondary | ICD-10-CM | POA: Insufficient documentation

## 2012-12-04 HISTORY — DX: Chronic kidney disease, stage 3 unspecified: N18.30

## 2012-12-04 MED ORDER — ALPRAZOLAM 0.5 MG PO TABS: ORAL_TABLET | ORAL | Status: DC

## 2012-12-04 MED ORDER — LOSARTAN POTASSIUM 50 MG PO TABS: ORAL_TABLET | ORAL | Status: DC

## 2012-12-04 NOTE — Assessment & Plan Note (Signed)
Est CrCl about 60. BMET today.

## 2012-12-04 NOTE — Telephone Encounter (Signed)
Patient requesting refill on xanax.  Patient last seen 11/30/12.  Last rx was 05/27/12 x 5 refills.  Please advise refill.

## 2012-12-04 NOTE — Assessment & Plan Note (Addendum)
Continue current meds, but encouraged pt to begin/resume home bp monitoring.  BMET today. Pt instructions: Monitor bp at home a few times a week: call our office if you get persistent bp on top of >150 or >95 on bottom.

## 2012-12-04 NOTE — Assessment & Plan Note (Signed)
Surveillance colonoscopy 2009, +polypectomy done-tubular adenoma w/out high grade.  Next colonoscopy due after 12/2012. She has received a recall letter and will contact [facility] to set up procedure with Dr. Henrene Pastor.

## 2012-12-04 NOTE — Telephone Encounter (Signed)
Alprazolam rx printed.

## 2012-12-04 NOTE — Assessment & Plan Note (Signed)
With history of panic. Stable. Continue xanax prn.

## 2012-12-04 NOTE — Assessment & Plan Note (Signed)
Recheck DEXA--this was ordered today.

## 2012-12-05 ENCOUNTER — Other Ambulatory Visit: Payer: Self-pay | Admitting: Family Medicine

## 2012-12-05 MED ORDER — FUROSEMIDE 40 MG PO TABS: 40.0000 mg | ORAL_TABLET | Freq: Two times a day (BID) | ORAL | Status: DC

## 2012-12-05 MED ORDER — LOSARTAN POTASSIUM 50 MG PO TABS: ORAL_TABLET | ORAL | Status: DC

## 2012-12-05 NOTE — Telephone Encounter (Signed)
Faxed to pharmacy

## 2012-12-05 NOTE — Telephone Encounter (Signed)
Rxs sent

## 2012-12-07 ENCOUNTER — Other Ambulatory Visit: Payer: Self-pay | Admitting: Family Medicine

## 2012-12-07 LAB — BASIC METABOLIC PANEL
BUN: 17 mg/dL (ref 6–23)
CO2: 30 mEq/L (ref 19–32)
Calcium: 9.8 mg/dL (ref 8.4–10.5)
Chloride: 102 mEq/L (ref 96–112)
Creat: 0.92 mg/dL (ref 0.50–1.10)
Glucose, Bld: 87 mg/dL (ref 70–99)
Sodium: 142 mEq/L (ref 135–145)

## 2012-12-24 ENCOUNTER — Encounter: Payer: Self-pay | Admitting: Family Medicine

## 2012-12-24 ENCOUNTER — Telehealth: Payer: Self-pay | Admitting: Family Medicine

## 2012-12-24 NOTE — Telephone Encounter (Signed)
Pls notify pt that her bone density testing was unchanged from the one 2 yrs ago.  I recommend she continue daily vit D and calcium supplement like she is already doing.  No additional meds at this time.  We'll repeat this test in 2 yrs.-thx

## 2012-12-25 NOTE — Telephone Encounter (Signed)
Let detailed message of patient DEXA results. (okay per DPR).

## 2013-01-02 ENCOUNTER — Encounter: Payer: Self-pay | Admitting: Family Medicine

## 2013-01-04 ENCOUNTER — Encounter: Payer: Self-pay | Admitting: Family Medicine

## 2013-01-04 ENCOUNTER — Telehealth: Payer: Self-pay | Admitting: Family Medicine

## 2013-01-04 ENCOUNTER — Ambulatory Visit (INDEPENDENT_AMBULATORY_CARE_PROVIDER_SITE_OTHER): Payer: Medicare Other | Admitting: Family Medicine

## 2013-01-04 VITALS — BP 156/82 | HR 79 | Temp 99.8°F | Resp 18 | Ht 62.0 in | Wt 168.0 lb

## 2013-01-04 DIAGNOSIS — J111 Influenza due to unidentified influenza virus with other respiratory manifestations: Secondary | ICD-10-CM

## 2013-01-04 MED ORDER — OSELTAMIVIR PHOSPHATE 75 MG PO CAPS: 75.0000 mg | ORAL_CAPSULE | Freq: Two times a day (BID) | ORAL | Status: DC

## 2013-01-04 MED ORDER — HYDROCODONE-ACETAMINOPHEN 5-325 MG PO TABS: ORAL_TABLET | ORAL | Status: DC

## 2013-01-04 NOTE — Progress Notes (Signed)
Pre visit review using our clinic review tool, if applicable. No additional management support is needed unless otherwise documented below in the visit note. 

## 2013-01-04 NOTE — Assessment & Plan Note (Signed)
Tamiflu 32m bid x 5d. Vicodin 5/325, 1 q6h prn cough/aches, 30, no RF.  Therapeutic expectations and side effect profile of medications discussed today.  Patient's questions answered. Signs/symptoms to call or return for were reviewed and pt expressed understanding.

## 2013-01-04 NOTE — Progress Notes (Signed)
OFFICE NOTE  01/04/2013  CC:  Chief Complaint  Patient presents with  . Cough    x Wednesday  . Chills     HPI: Patient is a 77 y.o. Caucasian female who is here for "cold". About 2 d/a started coughing, gradually worsening and hurting in chest when she coughs. Throat not sore.  A little nasal mucous.  No HA. Achy all over this morning but that was not present this yesterday and is gone currently.  Took 1/2 tab of vicodin about 12 noon today (4 hours ago).  Has not felt febrile but has felt like it is harder to get warm.  No n/v/d.   She did get flu vaccine in October this year. 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   Pertinent PMH:  Past Medical History  Diagnosis Date  . Hyperlipidemia   . Hypertension   . Rheumatic fever   . Arthritis   . Anxiety     with panic  . Peripheral edema   . Fracture of radial shaft, left, closed 11/16/10    fell down flight of stairs  . Hx of adenomatous colonic polyps 2002    surveillance colonoscopy 2009, +polypectomy done-tubular adenoma w/out high grade.  Next colonoscopy due after 12/2012  . Diverticulosis 2009 colonoscopy  . Osteopenia     DEXA 08/2010; repeat DEXA 11/2012   . Abnormal EKG approx 2008    Nuclear stress test neg;   . Cataract     s/p surgery--lens implants  . Chronic renal insufficiency, stage II (mild) 12/04/2012   History   Social History Narrative   Married, 2 sons.   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   No tobacco.  Rare alcohol.   No drugs.  Exercise: 4 times per week, about 34m.    MEDS:  Outpatient Prescriptions Prior to Visit  Medication Sig Dispense Refill  . ALPRAZolam (XANAX) 0.5 MG tablet take 1 tablet by mouth three times a day if needed for STRESS  90 tablet  5  . amLODipine (NORVASC) 10 MG tablet take 1 tablet by mouth once daily  90 tablet  0  . Ascorbic Acid (VITAMIN C) 500 MG tablet Take 500 mg by mouth 2 (two) times daily.        .Marland Kitchenaspirin 81 MG tablet Take 162 mg by  mouth daily.       .Marland Kitchenatorvastatin (LIPITOR) 40 MG tablet take 1 tablet by mouth once daily  90 tablet  3  . B Complex Vitamins (B COMPLEX-B12 PO) Take by mouth daily.        . calcium-vitamin D (OSCAL WITH D) 500-200 MG-UNIT per tablet Take 1 tablet by mouth daily.        . carboxymethylcellulose (REFRESH PLUS) 0.5 % SOLN Place 2 drops into both eyes daily.      . Cholecalciferol (VITAMIN D3) 400 UNITS CAPS Take by mouth daily.        . Coenzyme Q10 200 MG capsule Take 200 mg by mouth daily.        . furosemide (LASIX) 40 MG tablet Take 1 tablet (40 mg total) by mouth 2 (two) times daily.  180 tablet  1  . losartan (COZAAR) 50 MG tablet take 1 tablet by mouth once daily  90 tablet  1  . meloxicam (MOBIC) 15 MG tablet take 1 tablet by mouth once daily  90 tablet  1  . Multiple Vitamins-Minerals (CENTRUM SILVER ULTRA WOMENS) TABS Take by mouth.        .Marland Kitchen  Omega-3 Fatty Acids (FISH OIL) 1200 MG CAPS Take by mouth 2 (two) times daily.        . sodium chloride (OCEAN) 0.65 % nasal spray Place 1 spray into the nose daily.      . valACYclovir (VALTREX) 1000 MG tablet Take 1,000 mg by mouth daily as needed (fever blister). As directed.       No facility-administered medications prior to visit.    PE: Blood pressure 156/82, pulse 79, temperature 99.8 F (37.7 C), temperature source Temporal, resp. rate 18, height 5' 2"  (1.575 m), weight 168 lb (76.204 kg), SpO2 96.00%. VS: noted--normal. Gen: alert, NAD, NONTOXIC APPEARING. HEENT: eyes without injection, drainage, or swelling.  Ears: EACs clear, TMs with normal light reflex and landmarks.  Nose: Clear rhinorrhea, with some dried, crusty exudate adherent to mildly injected mucosa.  No purulent d/c.  No paranasal sinus TTP.  No facial swelling.  Throat and mouth without focal lesion.  No pharyngial swelling, erythema, or exudate.   Neck: supple, no LAD.   LUNGS: CTA bilat, nonlabored resps.   CV: RRR, no m/r/g. EXT: no c/c/e SKIN: no rash  LAB:  none  IMPRESSION AND PLAN:  Influenza-like illness Tamiflu 55m bid x 5d. Vicodin 5/325, 1 q6h prn cough/aches, 30, no RF.  Therapeutic expectations and side effect profile of medications discussed today.  Patient's questions answered. Signs/symptoms to call or return for were reviewed and pt expressed understanding.     An After Visit Summary was printed and given to the patient.  FOLLOW UP: prn

## 2013-01-04 NOTE — Telephone Encounter (Signed)
Patient Information:  Caller Name: [facility]  Phone: [phone]  Patient: Jaryiah, Mehlman  Gender: Female  DOB: [birth year]  Age: 77 Years  PCP: Ricardo Jericho [facility])  Office Follow Up:  Does the office need to follow up with this patient?: No  Instructions For The Office: N/A  RN Note:  Appt scheduled per request. Home care advice provided.  Symptoms  Reason For Call & Symptoms: Patient states she has  cough. Onset Wednesday 01/02/13. Afebrile. +body aches, +cough non productive, slight runny nose.  Coughing spells with restless sleep. Patient would like to be seen today.  Reviewed Health History In EMR: Yes  Reviewed Medications In EMR: Yes  Reviewed Allergies In EMR: Yes  Reviewed Surgeries / Procedures: Yes  Date of Onset of Symptoms: 01/02/2013  Treatments Tried: OTC cough medication, cough drops, airborne  Treatments Tried Worked: Yes  Guideline(s) Used:  Cough  Disposition Per Guideline:   See Today or Tomorrow in Office  Reason For Disposition Reached:   Patient wants to be seen  Advice Given:  Reassurance  Coughing is the way that our lungs remove irritants and mucus. It helps protect our lungs from getting pneumonia.  You can get a dry hacking cough after a chest cold. Sometimes this type of cough can last 1-3 weeks, and be worse at night.  Cough Medicines:  OTC Cough Drops: Cough drops can help a lot, especially for mild coughs. They reduce coughing by soothing your irritated throat and removing that tickle sensation in the back of the throat. Cough drops also have the advantage of portability - you can carry them with you.  Home Remedy - Hard Candy: Hard candy works just as well as medicine-flavored OTC cough drops. Diabetics should use sugar-free candy.  Home Remedy - Honey: This old home remedy has been shown to help decrease coughing at night. The adult dosage is 2 teaspoons (10 ml) at bedtime. Honey should not be given to infants under one year of  age.  Coughing Spasms:  Drink warm fluids. Inhale warm mist (Reason: both relax the airway and loosen up the phlegm).  Suck on cough drops or hard candy to coat the irritated throat.  Prevent Dehydration:  Drink adequate liquids.  This will help soothe an irritated or dry throat and loosen up the phlegm.  Avoid Tobacco Smoke:  Smoking or being exposed to smoke makes coughs much worse.  Call Back If:  Difficulty breathing  You become worse.  Patient Will Follow Care Advice:  YES  Appointment Scheduled:  01/04/2013 15:45:00 Appointment Scheduled Provider:  Ricardo Jericho [facility])

## 2013-01-14 ENCOUNTER — Encounter: Payer: Self-pay | Admitting: Family Medicine

## 2013-01-17 DIAGNOSIS — J189 Pneumonia, unspecified organism: Secondary | ICD-10-CM

## 2013-01-17 HISTORY — DX: Pneumonia, unspecified organism: J18.9

## 2013-03-07 ENCOUNTER — Encounter: Payer: Medicare Other | Admitting: Family Medicine

## 2013-03-21 ENCOUNTER — Encounter: Payer: Medicare Other | Admitting: Family Medicine

## 2013-03-28 ENCOUNTER — Ambulatory Visit: Payer: Medicare Other | Admitting: Family Medicine

## 2013-04-05 ENCOUNTER — Ambulatory Visit (INDEPENDENT_AMBULATORY_CARE_PROVIDER_SITE_OTHER): Payer: Medicare Other | Admitting: Family Medicine

## 2013-04-05 ENCOUNTER — Encounter: Payer: Self-pay | Admitting: Family Medicine

## 2013-04-05 VITALS — BP 145/82 | HR 78 | Temp 98.4°F | Resp 18 | Ht 62.0 in | Wt 167.0 lb

## 2013-04-05 DIAGNOSIS — I10 Essential (primary) hypertension: Secondary | ICD-10-CM

## 2013-04-05 DIAGNOSIS — E785 Hyperlipidemia, unspecified: Secondary | ICD-10-CM

## 2013-04-05 DIAGNOSIS — N182 Chronic kidney disease, stage 2 (mild): Secondary | ICD-10-CM

## 2013-04-05 DIAGNOSIS — M129 Arthropathy, unspecified: Secondary | ICD-10-CM

## 2013-04-05 MED ORDER — ASPIRIN 81 MG PO TABS: 162.0000 mg | ORAL_TABLET | Freq: Every day | ORAL | Status: DC

## 2013-04-05 NOTE — Progress Notes (Signed)
OFFICE NOTE  04/05/2013  CC:  Chief Complaint  Patient presents with  . Hypertension    pt wants to check BP Cuff to ours     HPI: Patient is a 78 y.o. Caucasian female who is here for routine chronic illness/med check f/u. "I need to check my bp cuff against yours". Feeling well. Home bp monitoring shows 130s over <80 consistently.  HR normal per pt. Her cuff measurement here today in office was the same as our cuff.  Takes mobic daily for bilat thumb arthritis.  Takes 2 ASA 60m tabs qd. Pt asked about NSAIDs and risk of worsening her mild CRI.  Compliant with statin, no side effects.   Pertinent PMH:  Past medical, surgical, social, and family history reviewed and no changes are noted since last office visit.  MEDS: Pt takes lasix only once daily, not bid as listed below. Outpatient Prescriptions Prior to Visit  Medication Sig Dispense Refill  . ALPRAZolam (XANAX) 0.5 MG tablet take 1 tablet by mouth three times a day if needed for STRESS  90 tablet  5  . amLODipine (NORVASC) 10 MG tablet take 1 tablet by mouth once daily  90 tablet  0  . Ascorbic Acid (VITAMIN C) 500 MG tablet Take 500 mg by mouth 2 (two) times daily.        .Marland Kitchenaspirin 81 MG tablet Take 162 mg by mouth daily.       .Marland Kitchenatorvastatin (LIPITOR) 40 MG tablet take 1 tablet by mouth once daily  90 tablet  3  . B Complex Vitamins (B COMPLEX-B12 PO) Take by mouth daily.        . calcium-vitamin D (OSCAL WITH D) 500-200 MG-UNIT per tablet Take 1 tablet by mouth daily.        . carboxymethylcellulose (REFRESH PLUS) 0.5 % SOLN Place 2 drops into both eyes daily.      . Cholecalciferol (VITAMIN D3) 400 UNITS CAPS Take by mouth daily.        . Coenzyme Q10 200 MG capsule Take 200 mg by mouth daily.        . furosemide (LASIX) 40 MG tablet Take 1 tablet (40 mg total) by mouth 2 (two) times daily.  180 tablet  1  . losartan (COZAAR) 50 MG tablet take 1 tablet by mouth once daily  90 tablet  1  . meloxicam (MOBIC) 15 MG  tablet take 1 tablet by mouth once daily  90 tablet  1  . Multiple Vitamins-Minerals (CENTRUM SILVER ULTRA WOMENS) TABS Take by mouth.        . Omega-3 Fatty Acids (FISH OIL) 1200 MG CAPS Take by mouth 2 (two) times daily.        . sodium chloride (OCEAN) 0.65 % nasal spray Place 1 spray into the nose daily.      .Marland KitchenHYDROcodone-acetaminophen (NORCO/VICODIN) 5-325 MG per tablet 1 tab po q6h prn aches and cough  30 tablet  0  . valACYclovir (VALTREX) 1000 MG tablet Take 1,000 mg by mouth daily as needed (fever blister). As directed.      .Marland Kitchenoseltamivir (TAMIFLU) 75 MG capsule Take 1 capsule (75 mg total) by mouth 2 (two) times daily.  10 capsule  0   No facility-administered medications prior to visit.    PE: Blood pressure 145/82, pulse 78, temperature 98.4 F (36.9 C), temperature source Temporal, resp. rate 18, height 5' 2"  (1.575 m), weight 167 lb (75.751 kg), SpO2 96.00%. Gen: Alert, well appearing.  Patient is oriented to person, place, time, and situation. CV: RRR, no m/r/g.   LUNGS: CTA bilat, nonlabored resps, good aeration in all lung fields. EXT: 1+ edema bilat--wearing compression stockings HANDS: no erythema, tenderness, deformity, or stiffness.  LABS: none today Results for orders placed in visit on 04/79/98  BASIC METABOLIC PANEL      Result Value Ref Range   Sodium 142  135 - 145 mEq/L   Potassium 4.7  3.5 - 5.3 mEq/L   Chloride 102  96 - 112 mEq/L   CO2 30  19 - 32 mEq/L   Glucose, Bld 87  70 - 99 mg/dL   BUN 17  6 - 23 mg/dL   Creat 0.92  0.50 - 1.10 mg/dL   Calcium 9.8  8.4 - 10.5 mg/dL   Lab Results  Component Value Date   CHOL 142 05/02/2012   HDL 56.40 05/02/2012   LDLCALC 68 05/02/2012   TRIG 88.0 05/02/2012   CHOLHDL 3 05/02/2012    IMPRESSION AND PLAN:  1) HTN: The current medical regimen is effective;  continue present plan and medications. Continue home bp monitoring.  Goal systolic 721-587 range, diastolic 27-61, HR >84.  2) Hyperlipidemia: on statin,  doing well.  3) CRI, stage II: avoid excessive NSAIDs--pt will d/c mobic and take tylenol prn.   Lasix no more than once a day at this point.  4) Osteoarthritis: hands, mild.  Tylenol prn--stop mobic.  FOLLOW UP: 49mo due for labs at that time.

## 2013-04-05 NOTE — Progress Notes (Signed)
Pre visit review using our clinic review tool, if applicable. No additional management support is needed unless otherwise documented below in the visit note. 

## 2013-04-05 NOTE — Patient Instructions (Signed)
Goal blood pressure range: AVERAGE top number 120-140, AVERAGE bottom number 60-90

## 2013-04-08 ENCOUNTER — Encounter: Payer: Self-pay | Admitting: Internal Medicine

## 2013-04-08 ENCOUNTER — Telehealth: Payer: Self-pay | Admitting: Family Medicine

## 2013-04-08 NOTE — Telephone Encounter (Signed)
Relevant patient education assigned to patient using Emmi. ° °

## 2013-05-14 ENCOUNTER — Other Ambulatory Visit: Payer: Self-pay

## 2013-05-14 MED ORDER — ATORVASTATIN CALCIUM 40 MG PO TABS: ORAL_TABLET | ORAL | Status: DC

## 2013-05-16 ENCOUNTER — Ambulatory Visit (AMBULATORY_SURGERY_CENTER): Payer: Medicare Other

## 2013-05-16 VITALS — Ht 63.0 in | Wt 168.8 lb

## 2013-05-16 DIAGNOSIS — Z860100 Personal history of colon polyps, unspecified: Secondary | ICD-10-CM

## 2013-05-16 MED ORDER — MOVIPREP 100 G PO SOLR: 1.0000 | Freq: Once | ORAL | Status: DC

## 2013-05-16 NOTE — Progress Notes (Signed)
No allergies to eggs or soy No home oxygen No diet/weight loss meds Has email. Emmi instructions given for colonoscopy No past problems with anesthesia.

## 2013-05-17 ENCOUNTER — Encounter: Payer: Self-pay | Admitting: Internal Medicine

## 2013-05-17 HISTORY — PX: COLONOSCOPY W/ POLYPECTOMY: SHX1380

## 2013-05-20 ENCOUNTER — Telehealth: Payer: Self-pay | Admitting: *Deleted

## 2013-05-20 NOTE — Telephone Encounter (Signed)
Requesting meloxicam 15 mg tablet 90 supply.

## 2013-05-20 NOTE — Telephone Encounter (Signed)
OK to send in mobic 60m, 1 tab po qd prn on three days per week maximum, #36, RF X 6. We had decided for her to try tylenol instead of this med last visit so we could try to spare her kidneys potential damage.  However, if she wants to restart it that fine but I recommend she stick to no more than 3 doses per week.-thx

## 2013-05-21 MED ORDER — MELOXICAM 15 MG PO TABS: ORAL_TABLET | ORAL | Status: DC

## 2013-05-21 NOTE — Telephone Encounter (Signed)
Patient's husband was notified of refill.

## 2013-05-22 ENCOUNTER — Other Ambulatory Visit: Payer: Self-pay

## 2013-05-22 MED ORDER — AMLODIPINE BESYLATE 10 MG PO TABS: ORAL_TABLET | ORAL | Status: DC

## 2013-05-30 ENCOUNTER — Ambulatory Visit (AMBULATORY_SURGERY_CENTER): Payer: Medicare Other | Admitting: Internal Medicine

## 2013-05-30 ENCOUNTER — Encounter: Payer: Self-pay | Admitting: Internal Medicine

## 2013-05-30 ENCOUNTER — Ambulatory Visit: Payer: Medicare Other | Admitting: Family Medicine

## 2013-05-30 VITALS — BP 135/79 | HR 58 | Temp 96.3°F | Resp 20 | Ht 63.0 in | Wt 168.0 lb

## 2013-05-30 DIAGNOSIS — D126 Benign neoplasm of colon, unspecified: Secondary | ICD-10-CM

## 2013-05-30 DIAGNOSIS — Z86010 Personal history of colonic polyps: Secondary | ICD-10-CM

## 2013-05-30 MED ORDER — SODIUM CHLORIDE 0.9 % IV SOLN: 500.0000 mL | INTRAVENOUS | Status: DC

## 2013-05-30 NOTE — Progress Notes (Signed)
Report to pacu rn, vss, bbs=clear 

## 2013-05-30 NOTE — Progress Notes (Signed)
Called to room to assist during endoscopic procedure.  Patient ID and intended procedure confirmed with present staff. Received instructions for my participation in the procedure from the performing physician.  

## 2013-05-30 NOTE — Patient Instructions (Signed)
YOU HAD AN ENDOSCOPIC PROCEDURE TODAY AT [facility]: Refer to the procedure report that was given to you for any specific questions about what was found during the examination.  If the procedure report does not answer your questions, please call your gastroenterologist to clarify.  If you requested that your care partner not be given the details of your procedure findings, then the procedure report has been included in a sealed envelope for you to review at your convenience later.  YOU SHOULD EXPECT: Some feelings of bloating in the abdomen. Passage of more gas than usual.  Walking can help get rid of the air that was put into your GI tract during the procedure and reduce the bloating. If you had a lower endoscopy (such as a colonoscopy or flexible sigmoidoscopy) you may notice spotting of blood in your stool or on the toilet paper. If you underwent a bowel prep for your procedure, then you may not have a normal bowel movement for a few days.  DIET: Your first meal following the procedure should be a light meal and then it is ok to progress to your normal diet.  A half-sandwich or bowl of soup is an example of a good first meal.  Heavy or fried foods are harder to digest and may make you feel nauseous or bloated.  Likewise meals heavy in dairy and vegetables can cause extra gas to form and this can also increase the bloating.  Drink plenty of fluids but you should avoid alcoholic beverages for 24 hours. Try to eat more fiber in your diet due to your Diverticulitis.  ACTIVITY: Your care partner should take you home directly after the procedure.  You should plan to take it easy, moving slowly for the rest of the day.  You can resume normal activity the day after the procedure however you should NOT DRIVE or use heavy machinery for 24 hours (because of the sedation medicines used during the test).    SYMPTOMS TO REPORT IMMEDIATELY: A gastroenterologist can be reached at any hour.  During  normal business hours, 8:30 AM to 5:00 PM Monday through Friday, call [phone].  After hours and on weekends, please call the GI answering service at [phone] who will take a message and have the physician on call contact you.   Following lower endoscopy (colonoscopy or flexible sigmoidoscopy):  Excessive amounts of blood in the stool  Significant tenderness or worsening of abdominal pains  Swelling of the abdomen that is new, acute  Fever of 100F or higher  FOLLOW UP: If any biopsies were taken you will be contacted by phone or by letter within the next 1-3 weeks.  Call your gastroenterologist if you have not heard about the biopsies in 3 weeks.  Our staff will call the home number listed on your records the next business day following your procedure to check on you and address any questions or concerns that you may have at that time regarding the information given to you following your procedure. This is a courtesy call and so if there is no answer at the home number and we have not heard from you through the emergency physician on call, we will assume that you have returned to your regular daily activities without incident.  SIGNATURES/CONFIDENTIALITY: You and/or your care partner have signed paperwork which will be entered into your electronic medical record.  These signatures attest to the fact that that the information above on your After Visit Summary has been  reviewed and is understood.  Full responsibility of the confidentiality of this discharge information lies with you and/or your care-partner.  Repeat colonoscopy in 3 years due to polyps.

## 2013-05-30 NOTE — Op Note (Signed)
[place]  [address]. [place], [id number]   COLONOSCOPY PROCEDURE REPORT  PATIENT: Brenda, Matthews  MR#: [id number] BIRTHDATE: 01/12/1936 , 78  yrs. old GENDER: Female ENDOSCOPIST: Eustace Quail, MD REFERRED PO:[id number] Program Recall PROCEDURE DATE:  05/30/2013 PROCEDURE:   Colonoscopy with snare polypectomy x 3 First Screening Colonoscopy - Avg.  risk and is 50 yrs.  old or older - No.  Prior Negative Screening - Now for repeat screening. N/A  History of Adenoma - Now for follow-up colonoscopy & has been > or = to 3 yrs.  Yes hx of adenoma.  Has been 3 or more years since last colonoscopy.  Polyps Removed Today? Yes. ASA CLASS:   Class II INDICATIONS:Patient's personal history of adenomatous colon polyps. Index 2002-advanced adenoma; 2009- small adenoma. MEDICATIONS: MAC sedation, administered by CRNA and propofol (Diprivan) 381m IV  DESCRIPTION OF PROCEDURE:   After the risks benefits and alternatives of the procedure were thoroughly explained, informed consent was obtained.  A digital rectal exam revealed no abnormalities of the rectum.   The LB [id number] endoscope was introduced through the anus and advanced to the cecum, which was identified by both the appendix and ileocecal valve. No adverse events experienced.   The quality of the prep was good, using MoviPrep  The instrument was then slowly withdrawn as the colon was fully examined.  COLON FINDINGS: Three polyps were found in the ascending (5 mm, 10 mm) and transverse (5 mm) colon.  A polypectomy was performed with a cold snare.  The resection was complete and the polyp tissue was completely retrieved.   Severe diverticulosis was noted  in the left colon.   The colon mucosa was otherwise normal.  Retroflexed views revealed no abnormalities. The time to cecum=4 minutes 05 seconds.  Withdrawal time=17 minutes 33 seconds.  The scope was withdrawn and the procedure  completed. COMPLICATIONS: There were no complications.  ENDOSCOPIC IMPRESSION: 1.   Three polyps were found in the ascending colon and transverse colon; polypectomy was performed with a cold snare 2.   Severe diverticulosis was noted in the left colon 3.   The colon mucosa was otherwise normal  RECOMMENDATIONS: 1. Repeat Colonoscopy in 3 years, if medically fit.   eSigned:  JEustace Quail MD 05/30/2013 9:48 AM   cc: PRicardo Jericho MD and The Patient

## 2013-05-31 ENCOUNTER — Telehealth: Payer: Self-pay | Admitting: *Deleted

## 2013-05-31 NOTE — Telephone Encounter (Signed)
No answer, left message to call if questions or concerns. 

## 2013-06-04 ENCOUNTER — Encounter: Payer: Self-pay | Admitting: Internal Medicine

## 2013-06-04 ENCOUNTER — Telehealth: Payer: Self-pay | Admitting: Family Medicine

## 2013-06-04 MED ORDER — FUROSEMIDE 40 MG PO TABS: 40.0000 mg | ORAL_TABLET | Freq: Every day | ORAL | Status: DC

## 2013-06-04 MED ORDER — ALPRAZOLAM 0.5 MG PO TABS: ORAL_TABLET | ORAL | Status: DC

## 2013-06-04 NOTE — Telephone Encounter (Signed)
Rf request for xanax.  Patient last OV was 05/30/13.  Last Rx was printed 11/01/25/12 x 5 rfs.  Please advise.

## 2013-06-04 NOTE — Telephone Encounter (Signed)
Faxed Rx to pharmacy.

## 2013-06-04 NOTE — Telephone Encounter (Signed)
OK, rx printed.

## 2013-07-18 ENCOUNTER — Ambulatory Visit (INDEPENDENT_AMBULATORY_CARE_PROVIDER_SITE_OTHER): Payer: Medicare Other

## 2013-07-18 ENCOUNTER — Encounter: Payer: Self-pay | Admitting: Podiatry

## 2013-07-18 ENCOUNTER — Ambulatory Visit (INDEPENDENT_AMBULATORY_CARE_PROVIDER_SITE_OTHER): Payer: Medicare Other | Admitting: Podiatry

## 2013-07-18 VITALS — BP 122/77 | HR 77 | Resp 16 | Ht 63.0 in | Wt 160.0 lb

## 2013-07-18 DIAGNOSIS — L84 Corns and callosities: Secondary | ICD-10-CM

## 2013-07-18 DIAGNOSIS — B351 Tinea unguium: Secondary | ICD-10-CM

## 2013-07-18 DIAGNOSIS — M2040 Other hammer toe(s) (acquired), unspecified foot: Secondary | ICD-10-CM

## 2013-07-18 DIAGNOSIS — M79673 Pain in unspecified foot: Secondary | ICD-10-CM

## 2013-07-18 DIAGNOSIS — M79609 Pain in unspecified limb: Secondary | ICD-10-CM

## 2013-07-18 NOTE — Progress Notes (Signed)
   Subjective:    Patient ID: Brenda Matthews, female    DOB: 06/23/1935, 78 y.o.   MRN: [id number]  HPI Comments: Pt complains of thick, discolored toenails right 1- 5, and left 1,3,4,5 toenails for many years.  Pt states she has used Fungi-nail and Vicks Vapor Rubs without success.  Pt states has painful, hard corns on B/L 4th toes, for years and has used the medicated pads which hurt, then changed to just pads for comfort.  Pt states after touring [place], she began to have callouses on the B/L 1st toes and MPJ while in athletic shoes.     Review of Systems  All other systems reviewed and are negative.      Objective:   Physical Exam        Assessment & Plan:

## 2013-07-20 NOTE — Progress Notes (Signed)
Subjective:     Patient ID: Brenda Matthews, female   DOB: Aug 27, 1935, 78 y.o.   MRN: [id number]  HPI patient states she has had nail disease for a long time and it continues to get thicker and is impossible for her to take care of and has used vicks vapo rub in the past and Fungi-Nail. Also complains of lesions between her toes which become sore   Review of Systems  All other systems reviewed and are negative.      Objective:   Physical Exam  Nursing note and vitals reviewed. Constitutional: She is oriented to person, place, and time.  Cardiovascular: Intact distal pulses.   Musculoskeletal: Normal range of motion.  Neurological: She is oriented to person, place, and time.  Skin: Skin is dry.   neurovascular status found to be intact with significant fungal nail disease of all nails which make them painful and hard to cut and they become brittle. Keratotic lesions underneath the fourth and fifth metatarsals of both feet and between the digits with keratotic tissue formation    Assessment:     Mycotic nail infection both feet with pain and lesion formation both feet that are tender digits that are pressing against each other    Plan:     H&P and all conditions discussed. Today debridement of nailbeds was accomplished and debridement of lesions was accomplished with no iatrogenic bleeding noted and patient is instructed on padding and reappoint 3 months to just continue routine care

## 2013-08-05 ENCOUNTER — Ambulatory Visit: Payer: Medicare Other | Admitting: Family Medicine

## 2013-08-21 ENCOUNTER — Other Ambulatory Visit: Payer: Self-pay | Admitting: Family Medicine

## 2013-08-21 MED ORDER — ATORVASTATIN CALCIUM 40 MG PO TABS: ORAL_TABLET | ORAL | Status: DC

## 2013-08-21 MED ORDER — AMLODIPINE BESYLATE 10 MG PO TABS: ORAL_TABLET | ORAL | Status: DC

## 2013-09-06 ENCOUNTER — Other Ambulatory Visit: Payer: Self-pay | Admitting: Family Medicine

## 2013-09-06 MED ORDER — LOSARTAN POTASSIUM 50 MG PO TABS: ORAL_TABLET | ORAL | Status: DC

## 2013-09-11 ENCOUNTER — Ambulatory Visit: Payer: Medicare Other | Admitting: Family Medicine

## 2013-10-04 ENCOUNTER — Ambulatory Visit: Payer: Medicare Other | Admitting: Family Medicine

## 2013-10-14 ENCOUNTER — Ambulatory Visit: Payer: Medicare Other | Admitting: Family Medicine

## 2013-10-17 ENCOUNTER — Encounter: Payer: Self-pay | Admitting: Podiatry

## 2013-10-17 ENCOUNTER — Ambulatory Visit (INDEPENDENT_AMBULATORY_CARE_PROVIDER_SITE_OTHER): Payer: Medicare Other | Admitting: Podiatry

## 2013-10-17 DIAGNOSIS — L84 Corns and callosities: Secondary | ICD-10-CM

## 2013-10-17 DIAGNOSIS — B351 Tinea unguium: Secondary | ICD-10-CM

## 2013-10-17 DIAGNOSIS — M79673 Pain in unspecified foot: Secondary | ICD-10-CM

## 2013-10-17 NOTE — Progress Notes (Signed)
Subjective:     Patient ID: Brenda Matthews, female   DOB: [birth year], 78 y.o.   MRN: [id number]  HPI patient presents with thick nails that are painful and she cannot cut on both feet and lesions on the plantar aspect of both feet that are painful and she cannot take care of   Review of Systems     Objective:   Physical Exam Neurovascular status unchanged with thick yellow brittle nailbeds 1-5 both feet and keratotic lesions on the plantar of both feet that are painful when pressed    Assessment:     Mycotic nail infection bilateral and lesions plantar aspect of both feet that are painful    Plan:     Debride painful nailbeds 1-5 both feet and lesions on both feet with no iatrogenic bleeding noted

## 2013-10-28 ENCOUNTER — Telehealth: Payer: Self-pay | Admitting: Family Medicine

## 2013-10-28 NOTE — Telephone Encounter (Signed)
Brenda Matthews has an appt. Nov 4 and is suppose to be doing a fasting bloodwork. Can she take RX meds prior to appt. Please call and verify that it is or is not ok. Thanks/ DH

## 2013-10-28 NOTE — Telephone Encounter (Signed)
Left detailed message notifying pt that she may take her medications prior to fasting appointment and she may also drink water or black coffee without sugar.

## 2013-10-29 ENCOUNTER — Ambulatory Visit: Payer: Medicare Other | Admitting: Family Medicine

## 2013-11-20 ENCOUNTER — Ambulatory Visit (INDEPENDENT_AMBULATORY_CARE_PROVIDER_SITE_OTHER): Payer: Medicare Other | Admitting: Family Medicine

## 2013-11-20 ENCOUNTER — Encounter: Payer: Self-pay | Admitting: Family Medicine

## 2013-11-20 VITALS — BP 122/75 | HR 93 | Temp 97.5°F | Resp 18 | Ht 62.0 in | Wt 165.0 lb

## 2013-11-20 DIAGNOSIS — Z23 Encounter for immunization: Secondary | ICD-10-CM

## 2013-11-20 DIAGNOSIS — I10 Essential (primary) hypertension: Secondary | ICD-10-CM

## 2013-11-20 DIAGNOSIS — J069 Acute upper respiratory infection, unspecified: Secondary | ICD-10-CM

## 2013-11-20 DIAGNOSIS — B373 Candidiasis of vulva and vagina: Secondary | ICD-10-CM

## 2013-11-20 DIAGNOSIS — B3731 Acute candidiasis of vulva and vagina: Secondary | ICD-10-CM

## 2013-11-20 DIAGNOSIS — N182 Chronic kidney disease, stage 2 (mild): Secondary | ICD-10-CM

## 2013-11-20 DIAGNOSIS — J180 Bronchopneumonia, unspecified organism: Secondary | ICD-10-CM

## 2013-11-20 DIAGNOSIS — E785 Hyperlipidemia, unspecified: Secondary | ICD-10-CM

## 2013-11-20 DIAGNOSIS — N2889 Other specified disorders of kidney and ureter: Secondary | ICD-10-CM

## 2013-11-20 MED ORDER — AZITHROMYCIN 250 MG PO TABS: ORAL_TABLET | ORAL | Status: DC

## 2013-11-20 MED ORDER — FLUCONAZOLE 150 MG PO TABS: ORAL_TABLET | ORAL | Status: DC

## 2013-11-20 NOTE — Progress Notes (Signed)
Pre visit review using our clinic review tool, if applicable. No additional management support is needed unless otherwise documented below in the visit note. 

## 2013-11-20 NOTE — Progress Notes (Signed)
OFFICE NOTE  11/20/2013  CC:  Chief Complaint  Patient presents with  . URI    x 1 month  . Cough    deep coughing started last night w/ chills  . Vaginitis    yeast infection.     HPI: Patient is a 78 y.o. Caucasian female who is here for 8 mo f/u HTN, hyperlip, anxiety, CRI stage 2. BP's normal at home. Compliant with statin, no side effects.  Onset 1 mo ago with clear nasal drainage, feeling stuffy in ears, then started getting cough and this has progressively worsened.  Throat feels sore when coughing.  Had subjective fever/chills.  No SOB but some chest tightness.  No wheezing.  Also with vaginal itching and "yeasty" discharge lately, minimal improvement with OTC med.    Pertinent PMH:  Past medical, surgical, social, and family history reviewed and no changes are noted since last office visit.  MEDS:  Outpatient Prescriptions Prior to Visit  Medication Sig Dispense Refill  . acetaminophen (TYLENOL) 325 MG tablet Take 650 mg by mouth every 6 (six) hours as needed.    . ALPRAZolam (XANAX) 0.5 MG tablet take 1 tablet by mouth three times a day if needed for STRESS 90 tablet 5  . amLODipine (NORVASC) 10 MG tablet take 1 tablet by mouth once daily 90 tablet 1  . Ascorbic Acid (VITAMIN C) 500 MG tablet Take 500 mg by mouth 2 (two) times daily.      Marland Kitchen aspirin 81 MG tablet Take 81 mg by mouth daily.    Marland Kitchen atorvastatin (LIPITOR) 40 MG tablet take 1 tablet by mouth once daily 90 tablet 1  . B Complex Vitamins (B COMPLEX-B12 PO) Take by mouth daily.      . calcium-vitamin D (OSCAL WITH D) 500-200 MG-UNIT per tablet Take 1 tablet by mouth daily.      . carboxymethylcellulose (REFRESH PLUS) 0.5 % SOLN Place 2 drops into both eyes daily.    . Coenzyme Q10 200 MG capsule Take 200 mg by mouth daily.      . furosemide (LASIX) 40 MG tablet Take 1 tablet (40 mg total) by mouth daily. 180 tablet 1  . losartan (COZAAR) 50 MG tablet take 1 tablet by mouth once daily 90 tablet 1  . Multiple  Vitamins-Minerals (CENTRUM SILVER ULTRA WOMENS) TABS Take by mouth.      . Omega-3 Fatty Acids (FISH OIL) 1200 MG CAPS Take by mouth 2 (two) times daily.      . sodium chloride (OCEAN) 0.65 % nasal spray Place 1 spray into the nose daily.    . valACYclovir (VALTREX) 1000 MG tablet Take 1,000 mg by mouth daily as needed (fever blister). As directed.     No facility-administered medications prior to visit.    PE: Blood pressure 122/75, pulse 93, temperature 97.5 F (36.4 C), temperature source Temporal, resp. rate 18, height 5' 2"  (1.575 m), weight 165 lb (74.844 kg), SpO2 93 %. VS: noted--normal. Gen: alert, NAD, NONTOXIC APPEARING. HEENT: eyes without injection, drainage, or swelling.  Ears: EACs clear, TMs with normal light reflex and landmarks.  Nose: Clear rhinorrhea, with some dried, crusty exudate adherent to mildly injected mucosa.  No purulent d/c.  No paranasal sinus TTP.  No facial swelling.  Throat and mouth without focal lesion.  No pharyngial swelling, erythema, or exudate.   Neck: supple, no LAD.   LUNGS: CTA bilat, nonlabored resps.   CV: RRR, no m/r/g. EXT: no c/c/e SKIN: no rash  LAB: none today  IMPRESSION AND PLAN:  1) Prolonged URI with acute bronchitis vs bronchopneumonia. Azithromycin x 5d. Mucinex DM bid prn.  2) HTN;The current medical regimen is effective;  continue present plan and medications.  3) Hyperlipidemia; The current medical regimen is effective;  continue present plan and medications.  4) yeast vaginitis-diflucan 119m x 1, rf x 1.  An After Visit Summary was printed and given to the patient. Flu vaccine IM today.  FOLLOW UP: 6 mo--labs due at that time.

## 2013-11-25 ENCOUNTER — Telehealth: Payer: Self-pay | Admitting: Family Medicine

## 2013-11-25 NOTE — Telephone Encounter (Signed)
Patient Information:  Caller Name: Mathew  Phone: [phone]  Patient: Brenda Matthews, Brenda Matthews  Gender: Female  DOB: [birth year]  Age: 78 Years  PCP: Ricardo Jericho [facility])  Office Follow Up:  Does the office need to follow up with this patient?: No  Instructions For The Office: N/A  RN Note:  Pt restarted her vitamins /is drinking fluids and has started back with a level of nutrition today.  Symptoms  Reason For Call & Symptoms: Pt was seen last on 11/20/13. Pt was diagnosed with bronchopneumonia. She had symptoms before she was seen for 1 month of cough/sore throat. Pt still feels really tired.  Pt has been sleeping alot with not much energy. She is callng to see if that is normal. She finished prescribed Zithromycin yesterday 11/24/13. Pt is afebrile. Pt states her cough is much reduced but she is still having coughing jags. Pts last fever was on Sat 11/23/13.  Reviewed Health History In EMR: Yes  Reviewed Medications In EMR: Yes  Reviewed Allergies In EMR: Yes  Reviewed Surgeries / Procedures: Yes  Date of Onset of Symptoms: 11/20/2013  Treatments Tried: zpac finished on 11/24/13.  Treatments Tried Worked: No  Guideline(s) Used:  Cough  Disposition Per Guideline:   Home Care  Reason For Disposition Reached:   Cough with no complications  Advice Given:  Reassurance  Coughing is the way that our lungs remove irritants and mucus. It helps protect our lungs from getting pneumonia.  You can get a dry hacking cough after a chest cold. Sometimes this type of cough can last 1-3 weeks, and be worse at night.  Cough Medicines:  Home Remedy - Honey: This old home remedy has been shown to help decrease coughing at night. The adult dosage is 2 teaspoons (10 ml) at bedtime. Honey should not be given to infants under one year of age.  Coughing Spasms:  Drink warm fluids. Inhale warm mist (Reason: both relax the airway and loosen up the phlegm).  Prevent Dehydration:  Drink adequate  liquids.  Call Back If:  Difficulty breathing  Cough lasts more than 3 weeks  You become worse.  Patient Will Follow Care Advice:  YES

## 2013-12-02 ENCOUNTER — Ambulatory Visit (INDEPENDENT_AMBULATORY_CARE_PROVIDER_SITE_OTHER): Payer: Medicare Other | Admitting: Family Medicine

## 2013-12-02 ENCOUNTER — Encounter: Payer: Self-pay | Admitting: Family Medicine

## 2013-12-02 VITALS — BP 136/78 | HR 95 | Temp 97.3°F | Resp 16 | Ht 62.0 in | Wt 162.0 lb

## 2013-12-02 DIAGNOSIS — J180 Bronchopneumonia, unspecified organism: Secondary | ICD-10-CM | POA: Insufficient documentation

## 2013-12-02 NOTE — Progress Notes (Signed)
OFFICE NOTE  12/02/2013  CC:  Chief Complaint  Patient presents with  . Follow-up   HPI: Patient is a 78 y.o. Caucasian female who is here for 2 wk f/u bronchopneumonia. She is much improved.  Took abx w/out problem. Cough is minimal, energy is still picking up.  Eating and drinking fine.  No fevers.   Pertinent PMH:  Past medical, surgical, social, and family history reviewed and no changes are noted since last office visit.  MEDS:  Outpatient Prescriptions Prior to Visit  Medication Sig Dispense Refill  . acetaminophen (TYLENOL) 325 MG tablet Take 650 mg by mouth every 6 (six) hours as needed.    . ALPRAZolam (XANAX) 0.5 MG tablet take 1 tablet by mouth three times a day if needed for STRESS 90 tablet 5  . amLODipine (NORVASC) 10 MG tablet take 1 tablet by mouth once daily 90 tablet 1  . Ascorbic Acid (VITAMIN C) 500 MG tablet Take 500 mg by mouth 2 (two) times daily.      Marland Kitchen aspirin 81 MG tablet Take 81 mg by mouth daily.    Marland Kitchen atorvastatin (LIPITOR) 40 MG tablet take 1 tablet by mouth once daily 90 tablet 1  . B Complex Vitamins (B COMPLEX-B12 PO) Take by mouth daily.      . calcium-vitamin D (OSCAL WITH D) 500-200 MG-UNIT per tablet Take 1 tablet by mouth daily.      . carboxymethylcellulose (REFRESH PLUS) 0.5 % SOLN Place 2 drops into both eyes daily.    . Coenzyme Q10 200 MG capsule Take 200 mg by mouth daily.      . furosemide (LASIX) 40 MG tablet Take 1 tablet (40 mg total) by mouth daily. 180 tablet 1  . losartan (COZAAR) 50 MG tablet take 1 tablet by mouth once daily 90 tablet 1  . Multiple Vitamins-Minerals (CENTRUM SILVER ULTRA WOMENS) TABS Take by mouth.      . Omega-3 Fatty Acids (FISH OIL) 1200 MG CAPS Take by mouth 2 (two) times daily.      . sodium chloride (OCEAN) 0.65 % nasal spray Place 1 spray into the nose daily.    . valACYclovir (VALTREX) 1000 MG tablet Take 1,000 mg by mouth daily as needed (fever blister). As directed.    . fluconazole (DIFLUCAN) 150 MG  tablet 1 tab po qd x 1 dose for vaginal yeast infection 1 tablet 1  . azithromycin (ZITHROMAX) 250 MG tablet 2 tabs po qd x 1d, then 1 tab po qd x 4d 6 tablet 0   No facility-administered medications prior to visit.    PE: Blood pressure 136/78, pulse 95, temperature 97.3 F (36.3 C), temperature source Temporal, resp. rate 16, height 5' 2"  (1.575 m), weight 162 lb (73.483 kg), SpO2 95 %. Gen: Alert, well appearing.  Patient is oriented to person, place, time, and situation. CV: RRR, no m/r/g.   LUNGS: CTA bilat, nonlabored resps, good aeration in all lung fields.   IMPRESSION AND PLAN:  Bronchopneumonia, resolving appropriately. Expect gradual resolution of cough and return to normal energy level. Signs/symptoms to call or return for were reviewed and pt expressed understanding.  An After Visit Summary was printed and given to the patient.  FOLLOW UP:  Next routine chronic illness f/u 4 mo

## 2013-12-02 NOTE — Progress Notes (Signed)
Pre visit review using our clinic review tool, if applicable. No additional management support is needed unless otherwise documented below in the visit note. 

## 2013-12-11 ENCOUNTER — Other Ambulatory Visit: Payer: Self-pay | Admitting: Family Medicine

## 2013-12-11 MED ORDER — ALPRAZOLAM 0.5 MG PO TABS: ORAL_TABLET | ORAL | Status: DC

## 2013-12-11 NOTE — Telephone Encounter (Signed)
RF request for xanax.  Patient last OV was 12/02/13.  Last RX was 06/04/13 x 5 rfs.  Per Dr. Anitra Lauth, okay to print RX.  Rx will be faxed to [facility] aid.

## 2014-01-15 ENCOUNTER — Other Ambulatory Visit: Payer: Self-pay | Admitting: Family Medicine

## 2014-01-15 MED ORDER — ATORVASTATIN CALCIUM 40 MG PO TABS: ORAL_TABLET | ORAL | Status: DC

## 2014-01-15 MED ORDER — AMLODIPINE BESYLATE 10 MG PO TABS: ORAL_TABLET | ORAL | Status: DC

## 2014-01-22 ENCOUNTER — Ambulatory Visit (INDEPENDENT_AMBULATORY_CARE_PROVIDER_SITE_OTHER): Payer: Medicare Other | Admitting: Nurse Practitioner

## 2014-01-22 ENCOUNTER — Encounter: Payer: Self-pay | Admitting: Nurse Practitioner

## 2014-01-22 VITALS — BP 130/80 | HR 75 | Temp 98.0°F | Ht 62.0 in | Wt 163.0 lb

## 2014-01-22 DIAGNOSIS — J069 Acute upper respiratory infection, unspecified: Secondary | ICD-10-CM

## 2014-01-22 DIAGNOSIS — B9789 Other viral agents as the cause of diseases classified elsewhere: Principal | ICD-10-CM

## 2014-01-22 NOTE — Patient Instructions (Signed)
You have a cold virus causing your symptoms. The average duration of cold symptoms is 14 days. Start daily sinus rinses (neilmed Sinus Rinse). Tylenol or ibuprophen for headache. Vicks vapor rub under nose to help breathe. Benzocaine throat lozenges for sore throat. Sip fluids every hour. Rest. If you are not feeling better in 10 days or develop fever or chest pain, call us for re-evaluation. Feel better!  Upper Respiratory Infection, Adult An upper respiratory infection (URI) is also sometimes known as the common cold. The upper respiratory tract includes the nose, sinuses, throat, trachea, and bronchi. Bronchi are the airways leading to the lungs. Most people improve within 1 week, but symptoms can last up to 2 weeks. A residual cough may last even longer.  CAUSES Many different viruses can infect the tissues lining the upper respiratory tract. The tissues become irritated and inflamed and often become very moist. Mucus production is also common. A cold is contagious. You can easily spread the virus to others by oral contact. This includes kissing, sharing a glass, coughing, or sneezing. Touching your mouth or nose and then touching a surface, which is then touched by another person, can also spread the virus. SYMPTOMS  Symptoms typically develop 1 to 3 days after you come in contact with a cold virus. Symptoms vary from person to person. They may include:  Runny nose.  Sneezing.  Nasal congestion.  Sinus irritation.  Sore throat.  Loss of voice (laryngitis).  Cough.  Fatigue.  Muscle aches.  Loss of appetite.  Headache.  Low-grade fever. DIAGNOSIS  You might diagnose your own cold based on familiar symptoms, since most people get a cold 2 to 3 times a year. Your caregiver can confirm this based on your exam. Most importantly, your caregiver can check that your symptoms are not due to another disease such as strep throat, sinusitis, pneumonia, asthma, or epiglottitis. Blood tests,  throat tests, and X-rays are not necessary to diagnose a common cold, but they may sometimes be helpful in excluding other more serious diseases. Your caregiver will decide if any further tests are required. RISKS AND COMPLICATIONS  You may be at risk for a more severe case of the common cold if you smoke cigarettes, have chronic heart disease (such as heart failure) or lung disease (such as asthma), or if you have a weakened immune system. The very young and very old are also at risk for more serious infections. Bacterial sinusitis, middle ear infections, and bacterial pneumonia can complicate the common cold. The common cold can worsen asthma and chronic obstructive pulmonary disease (COPD). Sometimes, these complications can require emergency medical care and may be life-threatening. PREVENTION  The best way to protect against getting a cold is to practice good hygiene. Avoid oral or hand contact with people with cold symptoms. Wash your hands often if contact occurs. There is no clear evidence that vitamin C, vitamin E, echinacea, or exercise reduces the chance of developing a cold. However, it is always recommended to get plenty of rest and practice good nutrition. TREATMENT  Treatment is directed at relieving symptoms. There is no cure. Antibiotics are not effective, because the infection is caused by a virus, not by bacteria. Treatment may include:  Increased fluid intake. Sports drinks offer valuable electrolytes, sugars, and fluids.  Breathing heated mist or steam (vaporizer or shower).  Eating chicken soup or other clear broths, and maintaining good nutrition.  Getting plenty of rest.  Using gargles or lozenges for comfort.  Controlling fevers  with ibuprofen or acetaminophen as directed by your caregiver.  Increasing usage of your inhaler if you have asthma. Zinc gel and zinc lozenges, taken in the first 24 hours of the common cold, can shorten the duration and lessen the severity of  symptoms. Pain medicines may help with fever, muscle aches, and throat pain. A variety of non-prescription medicines are available to treat congestion and runny nose. Your caregiver can make recommendations and may suggest nasal or lung inhalers for other symptoms.  HOME CARE INSTRUCTIONS   Only take over-the-counter or prescription medicines for pain, discomfort, or fever as directed by your caregiver.  Use a warm mist humidifier or inhale steam from a shower to increase air moisture. This may keep secretions moist and make it easier to breathe.  Drink enough water and fluids to keep your urine clear or pale yellow.  Rest as needed.  Return to work when your temperature has returned to normal or as your caregiver advises. You may need to stay home longer to avoid infecting others. You can also use a face mask and careful hand washing to prevent spread of the virus. SEEK MEDICAL CARE IF:   After the first few days, you feel you are getting worse rather than better.  You need your caregiver's advice about medicines to control symptoms.  You develop chills, worsening shortness of breath, or brown or red sputum. These may be signs of pneumonia.  You develop yellow or brown nasal discharge or pain in the face, especially when you bend forward. These may be signs of sinusitis.  You develop a fever, swollen neck glands, pain with swallowing, or white areas in the back of your throat. These may be signs of strep throat. SEEK IMMEDIATE MEDICAL CARE IF:   You have a fever.  You develop severe or persistent headache, ear pain, sinus pain, or chest pain.  You develop wheezing, a prolonged cough, cough up blood, or have a change in your usual mucus (if you have chronic lung disease).  You develop sore muscles or a stiff neck. Document Released: 06/29/2000 Document Revised: 03/28/2011 Document Reviewed: 05/07/2010 [facility] Patient Information 2014 [place], [place].

## 2014-01-22 NOTE — Progress Notes (Signed)
   Subjective:    Patient ID: Brenda Matthews, female    DOB: [birth year], 79 y.o.   MRN: [id number]  Sore Throat  This is a new problem. The current episode started 1 to 4 weeks ago (1 wk). The problem has been unchanged. Neither side of throat is experiencing more pain than the other. There has been no fever. The pain is mild. Associated symptoms include congestion (nasal) and coughing. Pertinent negatives include no diarrhea, ear pain, headaches, plugged ear sensation or shortness of breath. She has tried nothing for the symptoms.      Review of Systems  Constitutional: Negative for fever, chills and fatigue.  HENT: Positive for congestion (nasal) and postnasal drip. Negative for ear pain.   Respiratory: Positive for cough. Negative for chest tightness, shortness of breath and wheezing.   Cardiovascular: Negative for chest pain.  Gastrointestinal: Negative for diarrhea.  Neurological: Negative for headaches.       Objective:   Physical Exam  Constitutional: She appears well-developed and well-nourished. No distress.  HENT:  Head: Normocephalic and atraumatic.  Right Ear: External ear normal.  Left Ear: External ear normal.  Mouth/Throat: Oropharynx is clear and moist. No oropharyngeal exudate.  Eyes: Conjunctivae are normal. Right eye exhibits no discharge. Left eye exhibits no discharge.  Neck: Normal range of motion. Neck supple. No thyromegaly present.  Cardiovascular: Normal rate, regular rhythm and normal heart sounds.   No murmur heard. Pulmonary/Chest: Effort normal and breath sounds normal. No respiratory distress. She has no wheezes. She has no rales.  Lymphadenopathy:    She has no cervical adenopathy.  Skin: Skin is warm and dry.  Psychiatric: She has a normal mood and affect. Her behavior is normal. Thought content normal.  Vitals reviewed.         Assessment & Plan:  1. Viral upper respiratory tract infection with cough Symptom management See  instructions F/u PRN

## 2014-01-22 NOTE — Progress Notes (Signed)
Pre visit review using our clinic review tool, if applicable. No additional management support is needed unless otherwise documented below in the visit note. 

## 2014-02-05 ENCOUNTER — Encounter: Payer: Self-pay | Admitting: Family Medicine

## 2014-02-21 ENCOUNTER — Telehealth: Payer: Self-pay | Admitting: Family Medicine

## 2014-02-21 NOTE — Telephone Encounter (Signed)
Left detailed message on pt's phone.  Okay per DPR.

## 2014-02-21 NOTE — Telephone Encounter (Signed)
Pt was asking if Dr. Anitra Lauth did [facility] screening and does he think she need the screening. Pt was under the impression that this could be done at any doctor office. Can you advise pt. Please call [phone].

## 2014-03-12 ENCOUNTER — Other Ambulatory Visit: Payer: Self-pay | Admitting: Family Medicine

## 2014-03-12 MED ORDER — LOSARTAN POTASSIUM 50 MG PO TABS: ORAL_TABLET | ORAL | Status: DC

## 2014-04-16 ENCOUNTER — Telehealth: Payer: Self-pay | Admitting: *Deleted

## 2014-04-16 NOTE — Telephone Encounter (Signed)
Patient left voice mail wanting your opinion about her starting Nutra system, due to her kidney issues? Please advise?

## 2014-04-22 NOTE — Telephone Encounter (Signed)
She may start nutrisystem.

## 2014-04-22 NOTE — Telephone Encounter (Addendum)
Patient's husband Ladell Pier was notified.

## 2014-05-05 ENCOUNTER — Encounter: Payer: Self-pay | Admitting: Family Medicine

## 2014-05-05 ENCOUNTER — Ambulatory Visit (INDEPENDENT_AMBULATORY_CARE_PROVIDER_SITE_OTHER): Payer: Medicare Other | Admitting: Family Medicine

## 2014-05-05 VITALS — BP 118/74 | HR 89 | Temp 98.7°F | Resp 18 | Ht 62.0 in | Wt 163.0 lb

## 2014-05-05 DIAGNOSIS — J069 Acute upper respiratory infection, unspecified: Secondary | ICD-10-CM | POA: Diagnosis not present

## 2014-05-05 DIAGNOSIS — J208 Acute bronchitis due to other specified organisms: Secondary | ICD-10-CM

## 2014-05-05 MED ORDER — HYDROCODONE-HOMATROPINE 5-1.5 MG/5ML PO SYRP: ORAL_SOLUTION | ORAL | Status: DC

## 2014-05-05 MED ORDER — AZITHROMYCIN 250 MG PO TABS: ORAL_TABLET | ORAL | Status: DC

## 2014-05-05 MED ORDER — PREDNISONE 20 MG PO TABS: ORAL_TABLET | ORAL | Status: DC

## 2014-05-05 NOTE — Patient Instructions (Signed)
Buy OTC mucinex DM and take as directed on box.

## 2014-05-05 NOTE — Progress Notes (Signed)
OFFICE NOTE  05/05/2014  CC:  Chief Complaint  Patient presents with  . Cough   HPI: Patient is a 79 y.o. Caucasian female who is here for cough.  Onset 1 week ago, cough.  Some nasal congestion.  Throat is sore from coughing, has lost some of her voice.  No fevers.  Occ feeling of SOB after she has been coughing a lot. Coughing "fits" in which she can't stop coughing and has to get something to drink. Some mucous production with cough.  Feeling very tired.  Pertinent PMH:  Past medical, surgical, social, and family history reviewed and no changes are noted since last office visit.  MEDS:  Outpatient Prescriptions Prior to Visit  Medication Sig Dispense Refill  . acetaminophen (TYLENOL) 325 MG tablet Take 650 mg by mouth every 6 (six) hours as needed.    . ALPRAZolam (XANAX) 0.5 MG tablet take 1 tablet by mouth three times a day if needed for STRESS 90 tablet 5  . amLODipine (NORVASC) 10 MG tablet take 1 tablet by mouth once daily 90 tablet 1  . Ascorbic Acid (VITAMIN C) 500 MG tablet Take 500 mg by mouth 2 (two) times daily.      Marland Kitchen aspirin 81 MG tablet Take 81 mg by mouth daily.    Marland Kitchen atorvastatin (LIPITOR) 40 MG tablet take 1 tablet by mouth once daily 90 tablet 1  . B Complex Vitamins (B COMPLEX-B12 PO) Take by mouth daily.      . calcium-vitamin D (OSCAL WITH D) 500-200 MG-UNIT per tablet Take 1 tablet by mouth daily.      . carboxymethylcellulose (REFRESH PLUS) 0.5 % SOLN Place 2 drops into both eyes daily.    . Coenzyme Q10 200 MG capsule Take 200 mg by mouth daily.      . furosemide (LASIX) 40 MG tablet Take 1 tablet (40 mg total) by mouth daily. 180 tablet 1  . losartan (COZAAR) 50 MG tablet take 1 tablet by mouth once daily 90 tablet 0  . Multiple Vitamins-Minerals (CENTRUM SILVER ULTRA WOMENS) TABS Take by mouth.      . Omega-3 Fatty Acids (FISH OIL) 1200 MG CAPS Take by mouth 2 (two) times daily.      . sodium chloride (OCEAN) 0.65 % nasal spray Place 1 spray into the nose  daily.    . valACYclovir (VALTREX) 1000 MG tablet Take 1,000 mg by mouth daily as needed (fever blister). As directed.     No facility-administered medications prior to visit.    PE: Blood pressure 118/74, pulse 89, temperature 98.7 F (37.1 C), temperature source Temporal, resp. rate 18, height 5' 2"  (1.575 m), weight 163 lb (73.936 kg), SpO2 91 %. VS: noted--normal. Gen: alert, NAD, NONTOXIC APPEARING. HEENT: eyes without injection, drainage, or swelling.  Ears: EACs clear, TMs with normal light reflex and landmarks.  Nose: Clear rhinorrhea, with some dried, crusty exudate adherent to mildly injected mucosa.  No purulent d/c.  No paranasal sinus TTP.  No facial swelling.  Throat and mouth without focal lesion.  No pharyngial swelling, erythema, or exudate.   Neck: supple, no LAD.   LUNGS: CTA bilat, nonlabored resps.   CV: RRR, no m/r/g. EXT: no c/c/e SKIN: no rash   IMPRESSION AND PLAN:  Acute bronchitis, getting worse regarding malaise. No sign of RAD. Prednisone 27m qd x 5d. Azith x 5d. Hycodan syrup, 1 tsp qhs prn, #120 ml. Mucinex DM OTC. Rest, fluids.  An After Visit Summary was printed and  given to the patient.  FOLLOW UP: prn

## 2014-05-09 ENCOUNTER — Other Ambulatory Visit: Payer: Self-pay | Admitting: Family Medicine

## 2014-05-09 ENCOUNTER — Telehealth: Payer: Self-pay | Admitting: Family Medicine

## 2014-05-09 MED ORDER — PREDNISONE 20 MG PO TABS
ORAL_TABLET | ORAL | Status: DC
Start: 2014-05-09 — End: 2014-06-11

## 2014-05-09 NOTE — Telephone Encounter (Signed)
Please advise 

## 2014-05-09 NOTE — Telephone Encounter (Signed)
Reassure pt that the cough she has could take weeks to completely go away.  She will really have to be patient. I will try to help things along a bit more with 5 more days of prednisone. I'll eRx to her pharmacy.-thx

## 2014-05-09 NOTE — Telephone Encounter (Signed)
Pt aware.

## 2014-05-09 NOTE — Telephone Encounter (Signed)
Pt. Called and stated she is feeling a little better but she can not get her cough under control. She is concerned she is not bouncing back as fast as she thinks she should. Was wondering if there is anything else she can do?

## 2014-06-11 ENCOUNTER — Ambulatory Visit (INDEPENDENT_AMBULATORY_CARE_PROVIDER_SITE_OTHER): Payer: Medicare Other

## 2014-06-11 ENCOUNTER — Ambulatory Visit (INDEPENDENT_AMBULATORY_CARE_PROVIDER_SITE_OTHER): Payer: Medicare Other | Admitting: Family Medicine

## 2014-06-11 ENCOUNTER — Other Ambulatory Visit: Payer: Self-pay | Admitting: *Deleted

## 2014-06-11 VITALS — BP 126/62 | HR 75 | Temp 98.2°F | Resp 18 | Ht 62.25 in | Wt 162.0 lb

## 2014-06-11 DIAGNOSIS — R350 Frequency of micturition: Secondary | ICD-10-CM | POA: Diagnosis not present

## 2014-06-11 DIAGNOSIS — R05 Cough: Secondary | ICD-10-CM

## 2014-06-11 DIAGNOSIS — R059 Cough, unspecified: Secondary | ICD-10-CM

## 2014-06-11 DIAGNOSIS — R6883 Chills (without fever): Secondary | ICD-10-CM

## 2014-06-11 LAB — POCT CBC
Granulocyte percent: 71.2 %G (ref 37–80)
HCT, POC: 42.9 % (ref 37.7–47.9)
Hemoglobin: 13.4 g/dL (ref 12.2–16.2)
Lymph, poc: 1.9 (ref 0.6–3.4)
MCH, POC: 28 pg (ref 27–31.2)
MCHC: 31.3 g/dL — AB (ref 31.8–35.4)
MCV: 89.3 fL (ref 80–97)
MID (cbc): 0.2 (ref 0–0.9)
MPV: 6.4 fL (ref 0–99.8)
POC Granulocyte: 5.3 (ref 2–6.9)
POC LYMPH PERCENT: 25.6 %L (ref 10–50)
POC MID %: 3.2 %M (ref 0–12)
Platelet Count, POC: 220 10*3/uL (ref 142–424)
RBC: 4.8 M/uL (ref 4.04–5.48)
RDW, POC: 16 %
WBC: 7.5 10*3/uL (ref 4.6–10.2)

## 2014-06-11 LAB — POCT UA - MICROSCOPIC ONLY
Casts, Ur, LPF, POC: NEGATIVE
Crystals, Ur, HPF, POC: NEGATIVE
Mucus, UA: NEGATIVE
Yeast, UA: NEGATIVE

## 2014-06-11 LAB — POCT URINALYSIS DIPSTICK
Bilirubin, UA: NEGATIVE
Glucose, UA: NEGATIVE
Ketones, UA: NEGATIVE
Nitrite, UA: POSITIVE
Spec Grav, UA: 1.01
Urobilinogen, UA: 0.2
pH, UA: 5.5

## 2014-06-11 MED ORDER — HYDROCODONE-HOMATROPINE 5-1.5 MG/5ML PO SYRP: 5.0000 mL | ORAL_SOLUTION | Freq: Three times a day (TID) | ORAL | Status: DC | PRN

## 2014-06-11 MED ORDER — PREDNISONE 20 MG PO TABS: 20.0000 mg | ORAL_TABLET | Freq: Every day | ORAL | Status: DC

## 2014-06-11 MED ORDER — LEVOFLOXACIN 500 MG PO TABS: 500.0000 mg | ORAL_TABLET | Freq: Every day | ORAL | Status: DC

## 2014-06-11 MED ORDER — LOSARTAN POTASSIUM 50 MG PO TABS: ORAL_TABLET | ORAL | Status: DC

## 2014-06-11 NOTE — Patient Instructions (Signed)
Please follow-up with your doctor next week to recheck the urine to make sure it's clear and also to listen to your lungs to make sure that this bronchial infection has cleared.

## 2014-06-11 NOTE — Telephone Encounter (Signed)
Fax from [facility]) requesting refill for Losartan. LOV 05/05/14 (for f/u 04/05/13), no up coming ov, last written: 03/12/14 w/ 0RF. Please advise. Thanks.

## 2014-06-11 NOTE — Progress Notes (Signed)
Subjective:    Patient ID: Brenda Matthews, female    DOB: [birth year], 79 y.o.   MRN: [id number]  This chart was scribed for Robyn Haber, MD, by Stephania Fragmin, ED Scribe. This patient was seen in room 10 and the patient's care was started at 12:59 PM.   HPI  HPI Comments: Brenda Matthews is a 79 y.o. female who presents to the [facility] complaining of multiple symptoms, including chills, tiredness, and a dry cough that began 2 days ago. Patient had pneumonia and bronchitis twice this year, and she states that these symptoms are typical whenever she gets bronchitis. 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 Her PCP Dr. Anitra Lauth is currently on vacation this week. She denies nausea. Patient has had a pneumonia vaccine.   Patient also complains of urinary urgency and retention, as she states she feels like she needs to go to the bathroom but is unable to. She is concerned she may have a UTI.   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  Review of Systems  Constitutional: Positive for chills.  Respiratory: Positive for cough.   Gastrointestinal: Negative for nausea.  Genitourinary: Positive for urgency.       Objective:   Physical Exam  Constitutional: She is oriented to person, place, and time. She appears well-developed and well-nourished. No distress.  HENT:  Head: Normocephalic and atraumatic.  Mouth/Throat: Oropharynx is clear and moist.  Oropharynx edentulous. Posterior pharynx clear.   Eyes: Conjunctivae and EOM are normal.  Neck: Neck supple. No tracheal deviation present.  Cardiovascular: Normal rate.   Pulmonary/Chest: Effort normal. No respiratory distress. She has rhonchi.  A few rhonchi bilaterally.   Genitourinary:  No CVA tenderness.  Musculoskeletal: Normal range of motion.  Neurological: She is alert and oriented to person, place, and  time.  Skin: Skin is warm and dry.  Psychiatric: She has a normal mood and affect. Her behavior is normal.  Nursing note and vitals reviewed.   Primary X-Ray by Dr. Joseph Art at [facility]: Atelectasis in the right lobe. Elevated right hemi-diaphragm. Lingular infiltrate.  Results for orders placed or performed in visit on 06/11/14  POCT CBC  Result Value Ref Range   WBC 7.5 4.6 - 10.2 K/uL   Lymph, poc 1.9 0.6 - 3.4   POC LYMPH PERCENT 25.6 10 - 50 %L   MID (cbc) 0.2 0 - 0.9   POC MID % 3.2 0 - 12 %M   POC Granulocyte 5.3 2 - 6.9   Granulocyte percent 71.2 37 - 80 %G   RBC 4.80 4.04 - 5.48 M/uL   Hemoglobin 13.4 12.2 - 16.2 g/dL   HCT, POC 42.9 37.7 - 47.9 %   MCV 89.3 80 - 97 fL   MCH, POC 28.0 27 - 31.2 pg   MCHC 31.3 (A) 31.8 - 35.4 g/dL   RDW, POC 16.0 %   Platelet Count, POC 220 142 - 424 K/uL   MPV 6.4 0 - 99.8 fL       Assessment & Plan:   This chart was scribed in my presence and reviewed by me personally.    ICD-9-CM ICD-10-CM   1. Urinary frequency 788.41 R35.0 Urine culture     POCT UA - Microscopic Only     POCT urinalysis dipstick     POCT CBC  levofloxacin (LEVAQUIN) 500 MG tablet  2. Chills 780.64 R68.83 Urine culture     POCT UA - Microscopic Only     POCT urinalysis dipstick     POCT CBC     levofloxacin (LEVAQUIN) 500 MG tablet  3. Cough 786.2 R05 POCT CBC     DG Chest 2 View     levofloxacin (LEVAQUIN) 500 MG tablet     HYDROcodone-homatropine (HYCODAN) 5-1.5 MG/5ML syrup     predniSONE (DELTASONE) 20 MG tablet     Signed, Robyn Haber, MD Results for orders placed or performed in visit on 06/11/14  POCT UA - Microscopic Only  Result Value Ref Range   WBC, Ur, HPF, POC 0-3    RBC, urine, microscopic 35-40    Bacteria, U Microscopic 4+    Mucus, UA neg    Epithelial cells, urine per micros 0-2    Crystals, Ur, HPF, POC neg    Casts, Ur, LPF, POC neg    Yeast, UA neg    Renal tubular cells    POCT urinalysis dipstick  Result Value  Ref Range   Color, UA yellow    Clarity, UA clear    Glucose, UA neg    Bilirubin, UA neg    Ketones, UA neg    Spec Grav, UA 1.010    Blood, UA trace-intact    pH, UA 5.5    Protein, UA trace    Urobilinogen, UA 0.2    Nitrite, UA pos    Leukocytes, UA moderate (2+)   POCT CBC  Result Value Ref Range   WBC 7.5 4.6 - 10.2 K/uL   Lymph, poc 1.9 0.6 - 3.4   POC LYMPH PERCENT 25.6 10 - 50 %L   MID (cbc) 0.2 0 - 0.9   POC MID % 3.2 0 - 12 %M   POC Granulocyte 5.3 2 - 6.9   Granulocyte percent 71.2 37 - 80 %G   RBC 4.80 4.04 - 5.48 M/uL   Hemoglobin 13.4 12.2 - 16.2 g/dL   HCT, POC 42.9 37.7 - 47.9 %   MCV 89.3 80 - 97 fL   MCH, POC 28.0 27 - 31.2 pg   MCHC 31.3 (A) 31.8 - 35.4 g/dL   RDW, POC 16.0 %   Platelet Count, POC 220 142 - 424 K/uL   MPV 6.4 0 - 99.8 fL

## 2014-06-14 ENCOUNTER — Other Ambulatory Visit: Payer: Self-pay | Admitting: Family Medicine

## 2014-06-14 ENCOUNTER — Telehealth: Payer: Self-pay

## 2014-06-14 DIAGNOSIS — B9620 Unspecified Escherichia coli [E. coli] as the cause of diseases classified elsewhere: Secondary | ICD-10-CM

## 2014-06-14 DIAGNOSIS — N390 Urinary tract infection, site not specified: Principal | ICD-10-CM

## 2014-06-14 LAB — URINE CULTURE: Colony Count: >=100000

## 2014-06-14 MED ORDER — NITROFURANTOIN MONOHYD MACRO 100 MG PO CAPS
100.0000 mg | ORAL_CAPSULE | Freq: Two times a day (BID) | ORAL | Status: DC
Start: 2014-06-14 — End: 2014-06-18

## 2014-06-14 MED ORDER — AMOXICILLIN-POT CLAVULANATE 875-125 MG PO TABS
1.0000 | ORAL_TABLET | Freq: Two times a day (BID) | ORAL | Status: DC
Start: 2014-06-14 — End: 2014-08-20

## 2014-06-14 NOTE — Telephone Encounter (Signed)
Spoke with pt. She was given Levaquin which is also resistant. Spoke with [employer] [facility]. Pt was given abx for UTI and bronchitis. Macrobid is not good for bronchitis. Changed to Augment. Pt not allergic. Let pharm know to fill Augmentin and not Macrobid

## 2014-06-14 NOTE — Telephone Encounter (Signed)
-----   Message from Robyn Haber, MD sent at 06/14/2014 12:27 PM EDT ----- Patient has abnormal lab values.  This bacteria appears to be resistant to the Cipro.  I will call in a different antibiotic.

## 2014-06-18 ENCOUNTER — Encounter: Payer: Self-pay | Admitting: Family Medicine

## 2014-06-18 ENCOUNTER — Ambulatory Visit (INDEPENDENT_AMBULATORY_CARE_PROVIDER_SITE_OTHER): Payer: Medicare Other | Admitting: Family Medicine

## 2014-06-18 VITALS — BP 107/71 | HR 75 | Temp 98.1°F | Resp 16 | Wt 161.0 lb

## 2014-06-18 DIAGNOSIS — R5383 Other fatigue: Secondary | ICD-10-CM

## 2014-06-18 DIAGNOSIS — J180 Bronchopneumonia, unspecified organism: Secondary | ICD-10-CM

## 2014-06-18 DIAGNOSIS — N3001 Acute cystitis with hematuria: Secondary | ICD-10-CM | POA: Diagnosis not present

## 2014-06-18 DIAGNOSIS — I9589 Other hypotension: Secondary | ICD-10-CM | POA: Diagnosis not present

## 2014-06-18 NOTE — Progress Notes (Signed)
Pre visit review using our clinic review tool, if applicable. No additional management support is needed unless otherwise documented below in the visit note. 

## 2014-06-18 NOTE — Progress Notes (Signed)
OFFICE NOTE  06/18/2014  CC:  Chief Complaint  Patient presents with  . Bronchitis    Dx at [facility] advised to f/u with PCP. Pt states that she is feeling a little better.     HPI: Patient is a 79 y.o. Caucasian female who is here for f/u recent respiratory illness that she was seen for at [facility] on 06/11/14.  CXR showed RML atelectasis, elevated R hemidiaphram, and lingular infiltrate (infiltrate dx'd by treating MD, not the radiologist). She was rx'd prednisone 20 mg qd x 5d and levaquin.  She had a normal CBC, a UA that showed +hb and leuks, and a urine clx grew >100K E coli with sensitivity to augmentin so she was switched to this antibiotic (resistant to ampicillin, fluoroquinolones, and bactrim).   Has noted some bp in low-normal range lately.  Says she is hydrating well.  Denies dizziness/orthostatic sx's.  She still feels tired but very seldom has anymore cough or mucous production.  No fevers. She had mild dysuria at the o/v at [facility] but says this is resolved now.   (Pertinent PMH:  Past medical, surgical, social, and family history reviewed and no changes are noted since last office visit.  MEDS: Not taking levaquin or prednisone listed below Outpatient Prescriptions Prior to Visit  Medication Sig Dispense Refill  . acetaminophen (TYLENOL) 325 MG tablet Take 650 mg by mouth every 6 (six) hours as needed.    . ALPRAZolam (XANAX) 0.5 MG tablet take 1 tablet by mouth three times a day if needed for STRESS 90 tablet 5  . amLODipine (NORVASC) 10 MG tablet take 1 tablet by mouth once daily 90 tablet 1  . amoxicillin-clavulanate (AUGMENTIN) 875-125 MG per tablet Take 1 tablet by mouth 2 (two) times daily. 20 tablet 0  . Ascorbic Acid (VITAMIN C) 500 MG tablet Take 500 mg by mouth 2 (two) times daily.      Marland Kitchen aspirin 81 MG tablet Take 81 mg by mouth daily.    Marland Kitchen atorvastatin (LIPITOR) 40 MG tablet take 1 tablet by mouth once daily 90 tablet 1  . B Complex  Vitamins (B COMPLEX-B12 PO) Take by mouth daily.      . calcium-vitamin D (OSCAL WITH D) 500-200 MG-UNIT per tablet Take 1 tablet by mouth daily.      . carboxymethylcellulose (REFRESH PLUS) 0.5 % SOLN Place 2 drops into both eyes daily.    . Coenzyme Q10 200 MG capsule Take 200 mg by mouth daily.      . furosemide (LASIX) 40 MG tablet Take 1 tablet (40 mg total) by mouth daily. 180 tablet 1  . HYDROcodone-homatropine (HYCODAN) 5-1.5 MG/5ML syrup Take 5 mLs by mouth every 8 (eight) hours as needed for cough. 120 mL 0  . losartan (COZAAR) 50 MG tablet take 1 tablet by mouth once daily 90 tablet 3  . Multiple Vitamins-Minerals (CENTRUM SILVER ULTRA WOMENS) TABS Take by mouth.      . Omega-3 Fatty Acids (FISH OIL) 1200 MG CAPS Take by mouth 2 (two) times daily.      . sodium chloride (OCEAN) 0.65 % nasal spray Place 1 spray into the nose daily.    . valACYclovir (VALTREX) 1000 MG tablet Take 1,000 mg by mouth daily as needed (fever blister). As directed.    Marland Kitchen levofloxacin (LEVAQUIN) 500 MG tablet Take 1 tablet (500 mg total) by mouth daily. (Patient not taking: Reported on 06/18/2014) 7 tablet 0  . nitrofurantoin, macrocrystal-monohydrate, (  MACROBID) 100 MG capsule Take 1 capsule (100 mg total) by mouth 2 (two) times daily. (Patient not taking: Reported on 06/18/2014) 20 capsule 0  . predniSONE (DELTASONE) 20 MG tablet Take 1 tablet (20 mg total) by mouth daily with breakfast. (Patient not taking: Reported on 06/18/2014) 5 tablet 1   No facility-administered medications prior to visit.    PE: Blood pressure 107/71, pulse 75, temperature 98.1 F (36.7 C), temperature source Oral, resp. rate 16, weight 161 lb (73.029 kg), SpO2 92 %. Gen: Alert, well appearing.  Patient is oriented to person, place, time, and situation. CV: RRR, no m/r/g.   LUNGS: CTA bilat, nonlabored resps, good aeration in all lung fields. EXT: no cyanosis  IMPRESSION AND PLAN:  1) Resolving bronchopneumonia; finish  augmentin. May d/c mucinex DM since she has minimal sx's at this time. I reviewed her CXR from 2008 and her R hemidiaphragm elevation and CM was the same then as it was on CXR 06/11/14.  2) Resolving UTI: finish augmentin.  3) Fatigue, some low normal bp's likely due to mild volume depletion assoc with recent illness: I recommended she hold her lasix until feeling well again.  Also, decrease amlodipine to 83m qhs and continue to monitor bp at home. Continue good clear fluid intake.  An After Visit Summary was printed and given to the patient.  FOLLOW UP: prn

## 2014-07-11 ENCOUNTER — Other Ambulatory Visit: Payer: Self-pay | Admitting: *Deleted

## 2014-07-11 NOTE — Telephone Encounter (Signed)
RF request for atorvastatin and amlodipine.  LOV: 06/18/14 (acute visit) over due for f/u last f/u visit was 11/20/13 Next ov: None Last written: 01/15/14 #90 w/ 1RF both

## 2014-07-14 MED ORDER — AMLODIPINE BESYLATE 10 MG PO TABS: ORAL_TABLET | ORAL | Status: DC

## 2014-07-14 MED ORDER — ATORVASTATIN CALCIUM 40 MG PO TABS: ORAL_TABLET | ORAL | Status: DC

## 2014-07-22 ENCOUNTER — Other Ambulatory Visit: Payer: Self-pay | Admitting: *Deleted

## 2014-07-22 NOTE — Telephone Encounter (Signed)
Fax from [facility] stating that pt told them her Rx for amlodipine has been changed to 54m, if this is so they need a new Rx sent in. Please advise. Thanks.

## 2014-07-24 ENCOUNTER — Other Ambulatory Visit: Payer: Self-pay | Admitting: Family Medicine

## 2014-07-24 MED ORDER — AMLODIPINE BESYLATE 5 MG PO TABS: 5.0000 mg | ORAL_TABLET | Freq: Every day | ORAL | Status: DC

## 2014-08-20 ENCOUNTER — Ambulatory Visit (INDEPENDENT_AMBULATORY_CARE_PROVIDER_SITE_OTHER): Payer: Medicare Other | Admitting: Family Medicine

## 2014-08-20 ENCOUNTER — Encounter: Payer: Self-pay | Admitting: Family Medicine

## 2014-08-20 VITALS — BP 127/76 | HR 76 | Temp 98.2°F | Resp 16 | Ht 62.25 in | Wt 159.0 lb

## 2014-08-20 DIAGNOSIS — R05 Cough: Secondary | ICD-10-CM

## 2014-08-20 DIAGNOSIS — R059 Cough, unspecified: Secondary | ICD-10-CM

## 2014-08-20 DIAGNOSIS — R509 Fever, unspecified: Secondary | ICD-10-CM

## 2014-08-20 DIAGNOSIS — R5381 Other malaise: Secondary | ICD-10-CM | POA: Diagnosis not present

## 2014-08-20 MED ORDER — HYDROCODONE-HOMATROPINE 5-1.5 MG/5ML PO SYRP: 5.0000 mL | ORAL_SOLUTION | Freq: Three times a day (TID) | ORAL | Status: DC | PRN

## 2014-08-20 NOTE — Progress Notes (Signed)
Pre visit review using our clinic review tool, if applicable. No additional management support is needed unless otherwise documented below in the visit note. 

## 2014-08-20 NOTE — Progress Notes (Signed)
OFFICE NOTE  08/20/2014  CC:  Chief Complaint  Patient presents with  . Cough    x 3 days   HPI: Patient is a 79 y.o. Caucasian female who is here for cough. Onset of cough about 3d/a, had chills last 2 nights, fatigued/wants to sleep more.  No body aches.  NO SOB. Cough minimally productive.  No wheezing or chest tightness.  No nasal sx's.  Mild ST last night.  Appetite OK. No n/v/d or rash.  Pertinent PMH:  Past medical, surgical, social, and family history reviewed and no changes are noted since last office visit.  MEDS:  Outpatient Prescriptions Prior to Visit  Medication Sig Dispense Refill  . acetaminophen (TYLENOL) 325 MG tablet Take 650 mg by mouth every 6 (six) hours as needed.    . ALPRAZolam (XANAX) 0.5 MG tablet take 1 tablet by mouth three times a day if needed for STRESS 90 tablet 5  . amLODipine (NORVASC) 5 MG tablet Take 1 tablet (5 mg total) by mouth daily. 90 tablet 3  . Ascorbic Acid (VITAMIN C) 500 MG tablet Take 500 mg by mouth 2 (two) times daily.      Marland Kitchen aspirin 81 MG tablet Take 81 mg by mouth daily.    Marland Kitchen atorvastatin (LIPITOR) 40 MG tablet take 1 tablet by mouth once daily 90 tablet 3  . B Complex Vitamins (B COMPLEX-B12 PO) Take by mouth daily.      . calcium-vitamin D (OSCAL WITH D) 500-200 MG-UNIT per tablet Take 1 tablet by mouth daily.      . carboxymethylcellulose (REFRESH PLUS) 0.5 % SOLN Place 2 drops into both eyes daily.    . Coenzyme Q10 200 MG capsule Take 200 mg by mouth daily.      . furosemide (LASIX) 40 MG tablet Take 1 tablet (40 mg total) by mouth daily. 180 tablet 1  . losartan (COZAAR) 50 MG tablet take 1 tablet by mouth once daily 90 tablet 3  . Multiple Vitamins-Minerals (CENTRUM SILVER ULTRA WOMENS) TABS Take by mouth.      . Omega-3 Fatty Acids (FISH OIL) 1200 MG CAPS Take by mouth 2 (two) times daily.      . sodium chloride (OCEAN) 0.65 % nasal spray Place 1 spray into the nose daily.    . valACYclovir (VALTREX) 1000 MG tablet Take  1,000 mg by mouth daily as needed (fever blister). As directed.    Marland Kitchen amoxicillin-clavulanate (AUGMENTIN) 875-125 MG per tablet Take 1 tablet by mouth 2 (two) times daily. (Patient not taking: Reported on 08/20/2014) 20 tablet 0  . HYDROcodone-homatropine (HYCODAN) 5-1.5 MG/5ML syrup Take 5 mLs by mouth every 8 (eight) hours as needed for cough. (Patient not taking: Reported on 08/20/2014) 120 mL 0   No facility-administered medications prior to visit.    PE: Blood pressure 127/76, pulse 76, temperature 98.2 F (36.8 C), temperature source Oral, resp. rate 16, height 5' 2.25" (1.581 m), weight 159 lb (72.122 kg), SpO2 96 %. Gen: Alert, well appearing.  Patient is oriented to person, place, time, and situation. ENT: Ears: EACs clear, normal epithelium.  TMs with good light reflex and landmarks bilaterally.  Eyes: no injection, icteris, swelling, or exudate.  EOMI, PERRLA. Nose: no drainage or turbinate edema/swelling.  No injection or focal lesion.  Mouth: lips without lesion/swelling.  Oral mucosa pink and moist.  Dentition intact and without obvious caries or gingival swelling.  Oropharynx without erythema, exudate, or swelling.  Neck - No masses or thyromegaly or  limitation in range of motion CV: RRR, no m/r/g.   LUNGS: CTA bilat, nonlabored resps, good aeration in all lung fields. EXT: no clubbing, cyanosis, or edema.    IMPRESSION AND PLAN:  Acute bronchitis suspected, viral etiology suspected. Pt anxious, needs lots of reassurance.  I think with her subjective fevers + malaise it is reasonable to check a CXR for infiltrate--ordered today. Hycodan syrup 1 tsp q8h prn cough, #132m. Signs/symptoms to call or return for were reviewed and pt expressed understanding.  An After Visit Summary was printed and given to the patient.  FOLLOW UP: prn

## 2014-08-21 ENCOUNTER — Telehealth: Payer: Self-pay | Admitting: Family Medicine

## 2014-08-21 ENCOUNTER — Other Ambulatory Visit: Payer: Self-pay | Admitting: Family Medicine

## 2014-08-21 ENCOUNTER — Ambulatory Visit (HOSPITAL_BASED_OUTPATIENT_CLINIC_OR_DEPARTMENT_OTHER)
Admission: RE | Admit: 2014-08-21 | Discharge: 2014-08-21 | Disposition: A | Discharge status: Home / Self Care | Payer: Medicare Other | Source: Ambulatory Visit | Attending: Family Medicine | Admitting: Family Medicine

## 2014-08-21 ENCOUNTER — Other Ambulatory Visit (INDEPENDENT_AMBULATORY_CARE_PROVIDER_SITE_OTHER): Payer: Medicare Other

## 2014-08-21 DIAGNOSIS — R05 Cough: Secondary | ICD-10-CM | POA: Diagnosis present

## 2014-08-21 DIAGNOSIS — R059 Cough, unspecified: Secondary | ICD-10-CM

## 2014-08-21 DIAGNOSIS — R300 Dysuria: Secondary | ICD-10-CM

## 2014-08-21 DIAGNOSIS — R5381 Other malaise: Secondary | ICD-10-CM

## 2014-08-21 DIAGNOSIS — R509 Fever, unspecified: Secondary | ICD-10-CM

## 2014-08-21 LAB — POCT URINALYSIS DIPSTICK
Bilirubin, UA: NEGATIVE
GLUCOSE UA: NEGATIVE
Ketones, UA: NEGATIVE
Nitrite, UA: POSITIVE
PH UA: 6
Protein, UA: NEGATIVE
Spec Grav, UA: 1.01
Urobilinogen, UA: 0.2

## 2014-08-21 MED ORDER — SULFAMETHOXAZOLE-TRIMETHOPRIM 800-160 MG PO TABS: 1.0000 | ORAL_TABLET | Freq: Two times a day (BID) | ORAL | Status: DC

## 2014-08-21 NOTE — Telephone Encounter (Signed)
Pt has dysuria, foul smelling urine, urinary urgency: she asked if she could come in for lab visit and give urine sample and I said that would be fine. Urinalysis showed +nitrite, trace blood, + LEU.  No azo use. I sent urine for c/s.  I sent in rx for bactrim DS 1 bid x 5d.

## 2014-08-24 LAB — URINE CULTURE: Colony Count: >=100000

## 2014-08-29 ENCOUNTER — Other Ambulatory Visit: Payer: Self-pay | Admitting: Geriatric Medicine

## 2014-08-29 MED ORDER — AMOXICILLIN-POT CLAVULANATE 875-125 MG PO TABS: 1.0000 | ORAL_TABLET | Freq: Two times a day (BID) | ORAL | Status: DC

## 2014-09-05 ENCOUNTER — Other Ambulatory Visit: Payer: Self-pay | Admitting: *Deleted

## 2014-09-15 ENCOUNTER — Telehealth: Payer: Self-pay | Admitting: Family Medicine

## 2014-09-15 NOTE — Telephone Encounter (Signed)
Patient noticed in some old paperwork an order for an Abd aortic aneurism screening with aortic u/s  for Aug 2013. She never had the ultrasound. Should she have it done?

## 2014-09-15 NOTE — Telephone Encounter (Signed)
Please advise. Thanks.  

## 2014-09-16 NOTE — Telephone Encounter (Signed)
No, she does not need to get this.  She does not qualify for this screening test after all.

## 2014-09-16 NOTE — Telephone Encounter (Signed)
Pt advised and voiced understanding.   

## 2014-10-01 ENCOUNTER — Telehealth: Payer: Self-pay | Admitting: Family Medicine

## 2014-10-01 ENCOUNTER — Ambulatory Visit: Payer: Medicare Other

## 2014-10-01 ENCOUNTER — Ambulatory Visit: Payer: Medicare Other | Admitting: Family Medicine

## 2014-10-01 MED ORDER — CEFDINIR 300 MG PO CAPS: 300.0000 mg | ORAL_CAPSULE | Freq: Two times a day (BID) | ORAL | Status: DC

## 2014-10-01 NOTE — Telephone Encounter (Signed)
Saw pt in office today when she accompanied her husband for his visit, told her that I would call in new antibiotic--she was able to take only 2 doses of her augmentin due to vomiting of both doses.  She is still having bothersome LUTI sx's. Reviewed recent c/s and chose cefdinir 33m bid x 5d.

## 2014-10-01 NOTE — Telephone Encounter (Signed)
Patient had a reaction to antibiotic. She threw up the pills. Please send in new Rx for UTI to [place], [place].

## 2014-10-01 NOTE — Telephone Encounter (Signed)
Please advise. Thanks.  

## 2014-10-08 ENCOUNTER — Encounter: Payer: Medicare Other | Admitting: Family Medicine

## 2014-10-15 ENCOUNTER — Telehealth: Payer: Self-pay | Admitting: Family Medicine

## 2014-10-15 ENCOUNTER — Other Ambulatory Visit: Payer: Self-pay | Admitting: Family Medicine

## 2014-10-15 ENCOUNTER — Ambulatory Visit: Payer: Medicare Other | Admitting: Family Medicine

## 2014-10-15 MED ORDER — ALPRAZOLAM 0.5 MG PO TABS: ORAL_TABLET | ORAL | Status: DC

## 2014-10-15 NOTE — Telephone Encounter (Signed)
Pt here with husband today for his o/v. She asked for RF of her alprazolam, says it has been needed more lately b/c she feels heightened anxiety due to husband's illness and all the medical appt's, etc. She speaks of several years hx of brief periods of feeling palpitations/racing heart, often goes several days in between episodes, unpredictable.  She rests and the palpitations resolve w/in 5 min.  She takes alprazolam sometimes when the palp's come and associates this with improvement in things but admits it may simply be stopping and resting that is helping. At any rate, I asked her to come in for an o/v to discuss this complaint more in-depth and we'll do any necessary w/u.  Pt agreed to do this. I did RF her alprazolam rx today---handed the printed rx to her while she was here.

## 2014-10-17 ENCOUNTER — Encounter: Payer: Self-pay | Admitting: Family Medicine

## 2014-10-17 ENCOUNTER — Ambulatory Visit (INDEPENDENT_AMBULATORY_CARE_PROVIDER_SITE_OTHER): Payer: Medicare Other | Admitting: Family Medicine

## 2014-10-17 ENCOUNTER — Ambulatory Visit (HOSPITAL_COMMUNITY)
Admission: RE | Admit: 2014-10-17 | Discharge: 2014-10-17 | Disposition: A | Discharge status: Home / Self Care | Payer: Medicare Other | Source: Ambulatory Visit | Attending: Family Medicine | Admitting: Family Medicine

## 2014-10-17 ENCOUNTER — Other Ambulatory Visit (INDEPENDENT_AMBULATORY_CARE_PROVIDER_SITE_OTHER): Payer: Medicare Other

## 2014-10-17 VITALS — BP 150/92 | HR 72 | Temp 97.7°F | Resp 16 | Ht 62.25 in | Wt 158.0 lb

## 2014-10-17 DIAGNOSIS — R059 Cough, unspecified: Secondary | ICD-10-CM

## 2014-10-17 DIAGNOSIS — R0989 Other specified symptoms and signs involving the circulatory and respiratory systems: Secondary | ICD-10-CM

## 2014-10-17 DIAGNOSIS — R05 Cough: Secondary | ICD-10-CM | POA: Insufficient documentation

## 2014-10-17 DIAGNOSIS — R918 Other nonspecific abnormal finding of lung field: Secondary | ICD-10-CM | POA: Diagnosis not present

## 2014-10-17 DIAGNOSIS — R0689 Other abnormalities of breathing: Secondary | ICD-10-CM

## 2014-10-17 DIAGNOSIS — R002 Palpitations: Secondary | ICD-10-CM

## 2014-10-17 DIAGNOSIS — Z23 Encounter for immunization: Secondary | ICD-10-CM

## 2014-10-17 LAB — BASIC METABOLIC PANEL
BUN: 18 mg/dL (ref 6–23)
CALCIUM: 9.6 mg/dL (ref 8.4–10.5)
CO2: 29 mEq/L (ref 19–32)
CREATININE: 0.97 mg/dL (ref 0.40–1.20)
Chloride: 107 mEq/L (ref 96–112)
GFR: 59.08 mL/min — AB (ref >60.00)
GLUCOSE: 106 mg/dL — AB (ref 70–99)
POTASSIUM: 3.4 meq/L — AB (ref 3.5–5.1)
Sodium: 145 mEq/L (ref 135–145)

## 2014-10-17 LAB — TSH: TSH: 0.79 u[IU]/mL (ref 0.35–4.50)

## 2014-10-17 LAB — MAGNESIUM: Magnesium: 2.1 mg/dL (ref 1.5–2.5)

## 2014-10-17 NOTE — Progress Notes (Signed)
Pre visit review using our clinic review tool, if applicable. No additional management support is needed unless otherwise documented below in the visit note. 

## 2014-10-17 NOTE — Progress Notes (Addendum)
OFFICE VISIT  10/17/2014   CC:  Chief Complaint  Patient presents with  . Palpitations   HPI:    Patient is a 79 y.o. Caucasian female who presents for palpitations. Describes intermittent, brief periods of feeling her heart skip/race for a minute or two.  If she sits and rests she notes it goes away in less than a minute.  Never passed out but sometimes feels faint briefly, sometimes feels like she has to catch her breath.  No chest pain, no diaphoresis.  Usually occurs no more often than 1 time every 4-5 days. Can occur at rest or with movement, any time of day.   Drinks 2 cups coffee per day, occasional coke.  Occ tea. These attacks make her feel very anxious and panicky but do not occur in the context of any anxiety-provoking situations.  New complaint: dry cough has developed again over the last week or so.  No fevers, no malaise, no SOB or wheezing.  No signif nasal cong/runny nose, or ST.    Past Medical History  Diagnosis Date  . Hyperlipidemia   . Hypertension   . Rheumatic fever   . Arthritis   . Anxiety     with panic  . Peripheral edema   . Fracture of radial shaft, left, closed 11/16/10    fell down flight of stairs  . Hx of adenomatous colonic polyps 2002    surveillance colonoscopy 2009, +polypectomy done-tubular adenoma w/out high grade.  Next colonoscopy due after 12/2012  . Diverticulosis 2009 colonoscopy  . Osteopenia     DEXA 08/2010; repeat DEXA 11/2012   . Abnormal EKG approx 2008    Nuclear stress test neg;   . Cataract     s/p surgery--lens implants  . Chronic renal insufficiency, stage II (mild) 12/04/2012    Past Surgical History  Procedure Laterality Date  . Appendectomy  1966    done during surgery for tubal pregnancy  . Tonsillectomy    . Orif radial fracture  11/18/10    left; s/p slip on slippery floor and fell  . Cataract extraction w/ intraocular lens implant  2013    bilat  . Colonoscopy w/ polypectomy  05/2013    +diverticulosis;  recall 3 yrs (Dr. Henrene Pastor)    Outpatient Prescriptions Prior to Visit  Medication Sig Dispense Refill  . acetaminophen (TYLENOL) 325 MG tablet Take 650 mg by mouth every 6 (six) hours as needed.    . ALPRAZolam (XANAX) 0.5 MG tablet take 1 tablet by mouth three times a day if needed for STRESS 90 tablet 5  . amLODipine (NORVASC) 5 MG tablet Take 1 tablet (5 mg total) by mouth daily. 90 tablet 3  . Ascorbic Acid (VITAMIN C) 500 MG tablet Take 500 mg by mouth 2 (two) times daily.      Marland Kitchen aspirin 81 MG tablet Take 81 mg by mouth daily.    Marland Kitchen atorvastatin (LIPITOR) 40 MG tablet take 1 tablet by mouth once daily 90 tablet 3  . B Complex Vitamins (B COMPLEX-B12 PO) Take by mouth daily.      . calcium-vitamin D (OSCAL WITH D) 500-200 MG-UNIT per tablet Take 1 tablet by mouth daily.      . carboxymethylcellulose (REFRESH PLUS) 0.5 % SOLN Place 2 drops into both eyes daily.    . Coenzyme Q10 200 MG capsule Take 200 mg by mouth daily.      . furosemide (LASIX) 40 MG tablet Take 1 tablet (40 mg total) by  mouth daily. 180 tablet 1  . losartan (COZAAR) 50 MG tablet take 1 tablet by mouth once daily 90 tablet 3  . Multiple Vitamins-Minerals (CENTRUM SILVER ULTRA WOMENS) TABS Take by mouth.      . Omega-3 Fatty Acids (FISH OIL) 1200 MG CAPS Take by mouth 2 (two) times daily.      . sodium chloride (OCEAN) 0.65 % nasal spray Place 1 spray into the nose daily.    . valACYclovir (VALTREX) 1000 MG tablet Take 1,000 mg by mouth daily as needed (fever blister). As directed.    Marland Kitchen amoxicillin-clavulanate (AUGMENTIN) 875-125 MG per tablet Take 1 tablet by mouth 2 (two) times daily. (Patient not taking: Reported on 10/17/2014) 10 tablet 0  . cefdinir (OMNICEF) 300 MG capsule Take 1 capsule (300 mg total) by mouth 2 (two) times daily. (Patient not taking: Reported on 10/17/2014) 10 capsule 0  . HYDROcodone-homatropine (HYCODAN) 5-1.5 MG/5ML syrup Take 5 mLs by mouth every 8 (eight) hours as needed for cough. (Patient not  taking: Reported on 10/17/2014) 120 mL 0  . sulfamethoxazole-trimethoprim (BACTRIM DS,SEPTRA DS) 800-160 MG per tablet Take 1 tablet by mouth 2 (two) times daily. (Patient not taking: Reported on 10/17/2014) 10 tablet 0   No facility-administered medications prior to visit.    Allergies  Allergen Reactions  . Augmentin [Amoxicillin-Pot Clavulanate] Nausea And Vomiting    "projectile vomiting"    ROS As per HPI  PE: Blood pressure 150/92, pulse 72, temperature 97.7 F (36.5 C), temperature source Oral, resp. rate 16, height 5' 2.25" (1.581 m), weight 158 lb (71.668 kg), SpO2 93 %. Gen: Alert, well appearing.  Patient is oriented to person, place, time, and situation. NAT:FTDD: no injection, icteris, swelling, or exudate.  EOMI, PERRLA. Mouth: lips without lesion/swelling.  Oral mucosa pink and moist. Oropharynx without erythema, exudate, or swelling.  Neck: supple/nontender.  No LAD, mass, or TM.  Carotid pulses 2+ bilaterally, without bruits. CV: RRR, no m/r/g.   LUNGS: CTA bilat except for L basilar insp crackles, nonlabored resps, good aeration in all lung fields. EXT: trace bilat LE pitting edema   LABS:  Lab Results  Component Value Date   TSH 0.50 05/25/2010     Chemistry      Component Value Date/Time   NA 142 12/07/2012 1122   K 4.7 12/07/2012 1122   CL 102 12/07/2012 1122   CO2 30 12/07/2012 1122   BUN 17 12/07/2012 1122   CREATININE 0.92 12/07/2012 1122   CREATININE 1.0 05/02/2012 1530      Component Value Date/Time   CALCIUM 9.8 12/07/2012 1122   ALKPHOS 93 06/03/2011 0942   AST 23 06/03/2011 0942   ALT 22 06/03/2011 0942   BILITOT 0.8 06/03/2011 0942     Lab Results  Component Value Date   WBC 7.5 06/11/2014   HGB 13.4 06/11/2014   HCT 42.9 06/11/2014   MCV 89.3 06/11/2014   PLT 218.0 05/25/2010   Lab Results  Component Value Date   CHOL 142 05/02/2012   HDL 56.40 05/02/2012   LDLCALC 68 05/02/2012   TRIG 88.0 05/02/2012   CHOLHDL 3 05/02/2012    12 lead EKG today: NSR, rate 72, poor R wave progression, low voltage leads II and aVF  IMPRESSION AND PLAN:  1) Palpitations: r/o dysrhythmia.  Event monitor ordered today (these episodes occur 4-6 days apart typically).  EKG today reassuring.  Check TSH, BMET, Magnesium today.  Could also definitely be panic attacks/panic disorder. Continue alprazolam prn as  she is currently doing.    2) Cough: dry, w/out SOB, fever, or malaise. Suspect allergic type cough from ragweed lately, but given L basilar insp crackles on exam today will check CXR (CXR back in the spring 2016 was unremarkable).  3) Prev health care: High dose flu vaccine given today.  An After Visit Summary was printed and given to the patient.  FOLLOW UP: Return in about 6 weeks (around 11/28/2014) for 30 min f/u panic/palpitations.

## 2014-10-17 NOTE — Addendum Note (Signed)
Addended by: Ralph Dowdy on: 10/17/2014 02:13 PM   Modules accepted: Orders

## 2014-10-25 ENCOUNTER — Ambulatory Visit (INDEPENDENT_AMBULATORY_CARE_PROVIDER_SITE_OTHER): Payer: Medicare Other | Admitting: Emergency Medicine

## 2014-10-25 VITALS — BP 132/80 | HR 77 | Temp 97.8°F | Resp 16 | Ht 63.0 in | Wt 157.0 lb

## 2014-10-25 DIAGNOSIS — J209 Acute bronchitis, unspecified: Secondary | ICD-10-CM

## 2014-10-25 MED ORDER — ALBUTEROL SULFATE HFA 108 (90 BASE) MCG/ACT IN AERS: 2.0000 | INHALATION_SPRAY | RESPIRATORY_TRACT | Status: DC | PRN

## 2014-10-25 MED ORDER — AZITHROMYCIN 250 MG PO TABS: ORAL_TABLET | ORAL | Status: DC

## 2014-10-25 NOTE — Progress Notes (Signed)
Subjective:  Patient ID: Brenda Matthews, female    DOB: 11-Apr-1935  Age: 79 y.o. MRN: [id number]  CC: Cough and Wheezing   HPI Brenda Matthews presents  with cough and wheezing and some exertional shortness of breath. She's had a cough for the last 2 weeks been seen by her family doctor and had a negative chest radiograph. She's had persistent cough with some scant sputum production. She has no more of a postnasal drainage she has some exertional shortness of breath related to climbing stairs for example due to wheezing. She really doesn't wheeze except when she exerts herself no fever chills no sore throat or other complaint  History Brenda Matthews has a past medical history of Hyperlipidemia; Hypertension; Rheumatic fever; Arthritis; Anxiety; Peripheral edema; Fracture of radial shaft, left, closed (11/16/10); adenomatous colonic polyps (2002); Diverticulosis (2009 colonoscopy); Osteopenia; Abnormal EKG (approx 2008); Cataract; and Chronic renal insufficiency, stage II (mild) (12/04/2012).   She has past surgical history that includes Appendectomy (1966); Tonsillectomy; ORIF radial fracture (11/18/10); Cataract extraction w/ intraocular lens implant (2013); and Colonoscopy w/ polypectomy (05/2013).   Her  family history includes Heart disease in her father and mother; Hypertension in her brother. There is no history of Colon cancer, Pancreatic cancer, Rectal cancer, or Stomach cancer.  She   reports that she has never smoked. She has never used smokeless tobacco. She reports that she drinks alcohol. She reports that she does not use illicit drugs.  Outpatient Prescriptions Prior to Visit  Medication Sig Dispense Refill  . acetaminophen (TYLENOL) 325 MG tablet Take 650 mg by mouth every 6 (six) hours as needed.    . ALPRAZolam (XANAX) 0.5 MG tablet take 1 tablet by mouth three times a day if needed for STRESS 90 tablet 5  . amLODipine (NORVASC) 5 MG tablet Take 1 tablet (5 mg total) by mouth daily. 90  tablet 3  . Ascorbic Acid (VITAMIN C) 500 MG tablet Take 500 mg by mouth 2 (two) times daily.      Marland Kitchen aspirin 81 MG tablet Take 81 mg by mouth daily.    Marland Kitchen atorvastatin (LIPITOR) 40 MG tablet take 1 tablet by mouth once daily 90 tablet 3  . B Complex Vitamins (B COMPLEX-B12 PO) Take by mouth daily.      . calcium-vitamin D (OSCAL WITH D) 500-200 MG-UNIT per tablet Take 1 tablet by mouth daily.      . carboxymethylcellulose (REFRESH PLUS) 0.5 % SOLN Place 2 drops into both eyes daily.    . Coenzyme Q10 200 MG capsule Take 200 mg by mouth daily.      . furosemide (LASIX) 40 MG tablet Take 1 tablet (40 mg total) by mouth daily. 180 tablet 1  . losartan (COZAAR) 50 MG tablet take 1 tablet by mouth once daily 90 tablet 3  . Multiple Vitamins-Minerals (CENTRUM SILVER ULTRA WOMENS) TABS Take by mouth.      . Omega-3 Fatty Acids (FISH OIL) 1200 MG CAPS Take by mouth 2 (two) times daily.      . sodium chloride (OCEAN) 0.65 % nasal spray Place 1 spray into the nose daily.    . valACYclovir (VALTREX) 1000 MG tablet Take 1,000 mg by mouth daily as needed (fever blister). As directed.     No facility-administered medications prior to visit.    Social History   Social History  . Marital Status: Married    Spouse Name: N/A  . Number of Children: N/A  . Years of Education:  N/A   Social History Main Topics  . Smoking status: Never Smoker   . Smokeless tobacco: Never Used  . Alcohol Use: Yes     Comment: occ  . Drug Use: No  . Sexual Activity: Not Asked   Other Topics Concern  . None   Social History Narrative   Married, 2 sons.   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   No tobacco.  Rare alcohol.   No drugs.  Exercise: 4 times per week, about 38m.     Review of Systems  Constitutional: Negative for fever, chills and appetite change.  HENT: Positive for congestion and postnasal drip. Negative for ear pain, sinus pressure and sore throat.   Eyes: Negative for pain and redness.  Respiratory:  Positive for cough, shortness of breath (with exertion such as climbing stairs) and wheezing.   Cardiovascular: Negative for leg swelling.  Gastrointestinal: Negative for nausea, vomiting, abdominal pain, diarrhea, constipation and blood in stool.  Endocrine: Negative for polyuria.  Genitourinary: Negative for dysuria, urgency, frequency and flank pain.  Musculoskeletal: Negative for gait problem.  Skin: Negative for rash.  Neurological: Negative for weakness and headaches.  Psychiatric/Behavioral: Negative for confusion and decreased concentration. The patient is not nervous/anxious.     Objective:  BP 132/80 mmHg  Pulse 77  Temp(Src) 97.8 F (36.6 C) (Oral)  Resp 16  Ht 5' 3"  (1.6 m)  Wt 157 lb (71.215 kg)  BMI 27.82 kg/m2  SpO2 93%  LMP  (LMP Unknown)  Physical Exam  Constitutional: She is oriented to person, place, and time. She appears well-developed and well-nourished. No distress.  HENT:  Head: Normocephalic and atraumatic.  Right Ear: External ear normal.  Left Ear: External ear normal.  Nose: Nose normal.  Eyes: Conjunctivae and EOM are normal. Pupils are equal, round, and reactive to light. No scleral icterus.  Neck: Normal range of motion. Neck supple. No tracheal deviation present.  Cardiovascular: Normal rate, regular rhythm and normal heart sounds.   Pulmonary/Chest: Effort normal. No respiratory distress. She has no wheezes. She has no rales.  Abdominal: She exhibits no mass. There is no tenderness. There is no rebound and no guarding.  Musculoskeletal: She exhibits no edema.  Lymphadenopathy:    She has no cervical adenopathy.  Neurological: She is alert and oriented to person, place, and time. Coordination normal.  Skin: Skin is warm and dry. No rash noted.  Psychiatric: She has a normal mood and affect. Her behavior is normal.      Assessment & Plan:   HMeleenawas seen today for cough and wheezing.  Diagnoses and all orders for this visit:  Acute  bronchitis, unspecified organism  Other orders -     albuterol (PROVENTIL HFA;VENTOLIN HFA) 108 (90 BASE) MCG/ACT inhaler; Inhale 2 puffs into the lungs every 4 (four) hours as needed for wheezing or shortness of breath (cough, shortness of breath or wheezing.). -     azithromycin (ZITHROMAX) 250 MG tablet; Take 2 tabs PO x 1 dose, then 1 tab PO QD x 4 days  I am having Ms. Jafari start on albuterol and azithromycin. I am also having her maintain her B Complex Vitamins (B COMPLEX-B12 PO), CENTRUM SILVER ULTRA WOMENS, Coenzyme Q10, Fish Oil, calcium-vitamin D, vitamin C, valACYclovir, sodium chloride, carboxymethylcellulose, acetaminophen, aspirin, furosemide, losartan, atorvastatin, amLODipine, and ALPRAZolam.  Meds ordered this encounter  Medications  . albuterol (PROVENTIL HFA;VENTOLIN HFA) 108 (90 BASE) MCG/ACT inhaler    Sig: Inhale 2 puffs into the lungs every  4 (four) hours as needed for wheezing or shortness of breath (cough, shortness of breath or wheezing.).    Dispense:  1 Inhaler    Refill:  1  . azithromycin (ZITHROMAX) 250 MG tablet    Sig: Take 2 tabs PO x 1 dose, then 1 tab PO QD x 4 days    Dispense:  6 tablet    Refill:  0    Appropriate red flag conditions were discussed with the patient as well as actions that should be taken.  Patient expressed his understanding.  Follow-up: Return if symptoms worsen or fail to improve.  Roselee Culver, MD

## 2014-10-25 NOTE — Patient Instructions (Signed)
Metered Dose Inhaler (No Spacer Used) Inhaled medicines are the basis of treatment for asthma and other breathing problems. Inhaled medicine can only be effective if used properly. Good technique assures that the medicine reaches the lungs. Metered dose inhalers (MDIs) are used to deliver a variety of inhaled medicines. These include quick relief or rescue medicines (such as bronchodilators) and controller medicines (such as corticosteroids). The medicine is delivered by pushing down on a metal canister to release a set amount of spray. If you are using different kinds of inhalers, use your quick relief medicine to open the airways 10-15 minutes before using a steroid, if instructed to do so by your health care provider. If you are unsure which inhalers to use and the order of using them, ask your health care provider, nurse, or respiratory therapist. HOW TO USE THE INHALER 1. Remove the cap from the inhaler. 2. If you are using the inhaler for the first time, you will need to prime it. Shake the inhaler for 5 seconds and release four puffs into the air, away from your face. Ask your health care provider or pharmacist if you have questions about priming your inhaler. 3. Shake the inhaler for 5 seconds before each breath in (inhalation). 4. Position the inhaler so that the top of the canister faces up. 5. Put your index finger on the top of the medicine canister. Your thumb supports the bottom of the inhaler. 6. Open your mouth. 7. Either place the inhaler between your teeth and place your lips tightly around the mouthpiece, or hold the inhaler 1-2 inches away from your open mouth. If you are unsure of which technique to use, ask your health care provider. 8. Breathe out (exhale) normally and as completely as possible. 9. Press the canister down with the index finger to release the medicine. 10. At the same time as the canister is pressed, inhale deeply and slowly until your lungs are completely filled.  This should take 4-6 seconds. Keep your tongue down. 11. Hold the medicine in your lungs for 5-10 seconds (10 seconds is best). This helps the medicine get into the small airways of your lungs. 12. Breathe out slowly, through pursed lips. Whistling is an example of pursed lips. 13. Wait at least 1 minute between puffs. Continue with the above steps until you have taken the number of puffs your health care provider has ordered. Do not use the inhaler more than your health care provider directs you to. 14. Replace the cap on the inhaler. 15. Follow the directions from your health care provider or the inhaler insert for cleaning the inhaler. If you are using a steroid inhaler, after your last puff, rinse your mouth with water, gargle, and spit out the water. Do not swallow the water. AVOID:  Inhaling before or after starting the spray of medicine. It takes practice to coordinate your breathing with triggering the spray.  Inhaling through the nose (rather than the mouth) when triggering the spray. HOW TO DETERMINE IF YOUR INHALER IS FULL OR NEARLY EMPTY You cannot know when an inhaler is empty by shaking it. Some inhalers are now being made with dose counters. Ask your health care provider for a prescription that has a dose counter if you feel you need that extra help. If your inhaler does not have a counter, ask your health care provider to help you determine the date you need to refill your inhaler. Write the refill date on a calendar or your inhaler canister. Refill  your inhaler 7-10 days before it runs out. Be sure to keep an adequate supply of medicine. This includes making sure it has not expired, and making sure you have a spare inhaler. SEEK MEDICAL CARE IF:  Symptoms are only partially relieved with your inhaler.  You are having trouble using your inhaler.  You experience an increase in phlegm. SEEK IMMEDIATE MEDICAL CARE IF:  You feel little or no relief with your inhalers. You are still  wheezing and feeling shortness of breath, tightness in your chest, or both.  You have dizziness, headaches, or a fast heart rate.  You have chills, fever, or night sweats.  There is a noticeable increase in phlegm production, or there is blood in the phlegm. MAKE SURE YOU:  Understand these instructions.  Will watch your condition.  Will get help right away if you are not doing well or get worse.   This information is not intended to replace advice given to you by your health care provider. Make sure you discuss any questions you have with your health care provider.   Document Released: 10/31/2006 Document Revised: 01/24/2014 Document Reviewed: 06/21/2012 Elsevier Interactive Patient Education [employer].

## 2014-10-30 ENCOUNTER — Telehealth: Payer: Self-pay | Admitting: *Deleted

## 2014-10-30 NOTE — Telephone Encounter (Signed)
No further abx. I recommend prednisone. Pls eRx prednisone 72m, 2 tabs po qd x 5d, then 1 tab po qd x 5d, #15, no RF.

## 2014-10-30 NOTE — Telephone Encounter (Signed)
Pt stated that she was seen at an Urgent Care on Saturday (10/25/14) and was diagnosised with Bronchitis. She stated that they gave her a zpack and an inhaler. She stated that she has finished the antibiotic and still has wheezing and a cough. She wants to know if this is normal or does she need another antbiotic. Please advise. Thanks.

## 2014-10-30 NOTE — Telephone Encounter (Signed)
Tried calling home and cell NA and unable to leave a message.

## 2014-10-31 MED ORDER — PREDNISONE 20 MG PO TABS: ORAL_TABLET | ORAL | Status: DC

## 2014-10-31 NOTE — Telephone Encounter (Signed)
Pt advised and voiced understanding.  Rx sent.

## 2014-11-24 ENCOUNTER — Telehealth: Payer: Self-pay | Admitting: *Deleted

## 2014-11-24 MED ORDER — HYDROXYZINE HCL 25 MG PO TABS
ORAL_TABLET | ORAL | Status: DC
Start: 2014-11-24 — End: 2015-02-10

## 2014-11-24 NOTE — Telephone Encounter (Signed)
Pt LMOM on 11/24/14 stating that her husband Honestee Revard) passed away on 12-17-14. She stated that she has been having trouble sleeping more than 4 hours at a time. She wanted to know if Dr. Anitra Lauth could send in something for her to take short term for sleep or if he could recommend something otc. Please advise. Thanks.

## 2014-11-24 NOTE — Telephone Encounter (Signed)
OK. Hydroxyzine eRx'd to her pharmacy.

## 2014-11-24 NOTE — Telephone Encounter (Signed)
Pt advised and voiced understanding.   

## 2014-11-27 ENCOUNTER — Ambulatory Visit: Payer: Medicare Other | Admitting: Family Medicine

## 2015-01-26 ENCOUNTER — Ambulatory Visit: Payer: Medicare Other | Attending: Family Medicine

## 2015-02-10 ENCOUNTER — Ambulatory Visit (INDEPENDENT_AMBULATORY_CARE_PROVIDER_SITE_OTHER): Payer: Medicare Other | Admitting: Family Medicine

## 2015-02-10 ENCOUNTER — Encounter: Payer: Self-pay | Admitting: Family Medicine

## 2015-02-10 VITALS — BP 133/82 | HR 92 | Temp 98.3°F | Resp 20 | Wt 165.8 lb

## 2015-02-10 DIAGNOSIS — M899 Disorder of bone, unspecified: Secondary | ICD-10-CM

## 2015-02-10 DIAGNOSIS — J209 Acute bronchitis, unspecified: Secondary | ICD-10-CM

## 2015-02-10 DIAGNOSIS — E2839 Other primary ovarian failure: Secondary | ICD-10-CM

## 2015-02-10 DIAGNOSIS — M8580 Other specified disorders of bone density and structure, unspecified site: Secondary | ICD-10-CM

## 2015-02-10 MED ORDER — HYDROCODONE-HOMATROPINE 5-1.5 MG/5ML PO SYRP: 5.0000 mL | ORAL_SOLUTION | Freq: Three times a day (TID) | ORAL | Status: DC | PRN

## 2015-02-10 MED ORDER — DOXYCYCLINE HYCLATE 100 MG PO TABS: 100.0000 mg | ORAL_TABLET | Freq: Two times a day (BID) | ORAL | Status: DC

## 2015-02-10 NOTE — Patient Instructions (Signed)
- rest , hydrated. Mucinex. Humidifier. Antihistamine. - Doxycyline if no improvement or worsening  in 2 days.  - hycodan cough syrup as needed  Acute Bronchitis Bronchitis is inflammation of the airways that extend from the windpipe into the lungs (bronchi). The inflammation often causes mucus to develop. This leads to a cough, which is the most common symptom of bronchitis.  In acute bronchitis, the condition usually develops suddenly and goes away over time, usually in a couple weeks. Smoking, allergies, and asthma can make bronchitis worse. Repeated episodes of bronchitis may cause further lung problems.  CAUSES Acute bronchitis is most often caused by the same virus that causes a cold. The virus can spread from person to person (contagious) through coughing, sneezing, and touching contaminated objects. SIGNS AND SYMPTOMS   Cough.   Fever.   Coughing up mucus.   Body aches.   Chest congestion.   Chills.   Shortness of breath.   Sore throat.  DIAGNOSIS  Acute bronchitis is usually diagnosed through a physical exam. Your health care provider will also ask you questions about your medical history. Tests, such as chest X-rays, are sometimes done to rule out other conditions.  TREATMENT  Acute bronchitis usually goes away in a couple weeks. Oftentimes, no medical treatment is necessary. Medicines are sometimes given for relief of fever or cough. Antibiotic medicines are usually not needed but may be prescribed in certain situations. In some cases, an inhaler may be recommended to help reduce shortness of breath and control the cough. A cool mist vaporizer may also be used to help thin bronchial secretions and make it easier to clear the chest.  HOME CARE INSTRUCTIONS  Get plenty of rest.   Drink enough fluids to keep your urine clear or pale yellow (unless you have a medical condition that requires fluid restriction). Increasing fluids may help thin your respiratory  secretions (sputum) and reduce chest congestion, and it will prevent dehydration.   Take medicines only as directed by your health care provider.  If you were prescribed an antibiotic medicine, finish it all even if you start to feel better.  Avoid smoking and secondhand smoke. Exposure to cigarette smoke or irritating chemicals will make bronchitis worse. If you are a smoker, consider using nicotine gum or skin patches to help control withdrawal symptoms. Quitting smoking will help your lungs heal faster.   Reduce the chances of another bout of acute bronchitis by washing your hands frequently, avoiding people with cold symptoms, and trying not to touch your hands to your mouth, nose, or eyes.   Keep all follow-up visits as directed by your health care provider.  SEEK MEDICAL CARE IF: Your symptoms do not improve after 1 week of treatment.  SEEK IMMEDIATE MEDICAL CARE IF:  You develop an increased fever or chills.   You have chest pain.   You have severe shortness of breath.  You have bloody sputum.   You develop dehydration.  You faint or repeatedly feel like you are going to pass out.  You develop repeated vomiting.  You develop a severe headache. MAKE SURE YOU:   Understand these instructions.  Will watch your condition.  Will get help right away if you are not doing well or get worse.   This information is not intended to replace advice given to you by your health care provider. Make sure you discuss any questions you have with your health care provider.   Document Released: 02/11/2004 Document Revised: 01/24/2014 Document Reviewed: 06/26/2012  Chartered certified accountant Patient Education [employer].

## 2015-02-10 NOTE — Progress Notes (Signed)
Patient ID: Brenda Matthews, female   DOB: [birth year], 80 y.o.   MRN: [id number]    Brenda Matthews , 1935-09-27, 80 y.o., female MRN: [id number]  CC: cough with sore throat  Subjective:   Cough: Pt presents for an acute OV with complaints of cough of 4 days duration. Associated symptoms include dry cough, watery eyes, sore throat, nasal congestion, rhinorrhea, sinus pressure, vomit x1 (medicine related) and hoarseness. Tolerating PO. Denies headache, myalgia, nausea, diarrhea or rash. Denies fever or chills. 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 Pt has tried mucinex, allergy medicine to ease their symptoms.  History of pneumonia. No asthma or COPD history UTD with flu and TDap  Health maintenance/osteopenia:histroy of osteopenia, currently on Ca/Vit D supplement. Needs bone density follow up and would like to coordinate it with her mammogram at [facility]. She has not seen her PCP for preventive yet this year after her husband passed away. She is requesting this provider order for her. Review of bone density 12/07/2012 at [facility] with -2.1 T- score. recommended 2 year follow up scan, which is now overdue.   Allergies  Allergen Reactions  . Augmentin [Amoxicillin-Pot Clavulanate] Nausea And Vomiting    "projectile vomiting"   Social History  Substance Use Topics  . Smoking status: Never Smoker   . Smokeless tobacco: Never Used  . Alcohol Use: Yes     Comment: occ   Past Medical History  Diagnosis Date  . Hyperlipidemia   . Hypertension   . Rheumatic fever   . Arthritis   . Anxiety     with panic  . Peripheral edema   . Fracture of radial shaft, left, closed 11/16/10    fell down flight of stairs  . Hx of adenomatous colonic polyps 2002    surveillance colonoscopy 2009, +polypectomy done-tubular adenoma w/out high grade.  Next colonoscopy due after 12/2012  . Diverticulosis 2009 colonoscopy  . Osteopenia     DEXA 08/2010; repeat DEXA 11/2012   . Abnormal EKG approx 2008    Nuclear stress test neg;     . Cataract     s/p surgery--lens implants  . Chronic renal insufficiency, stage II (mild) 12/04/2012   Past Surgical History  Procedure Laterality Date  . Appendectomy  1966    done during surgery for tubal pregnancy  . Tonsillectomy    . Orif radial fracture  11/18/10    left; s/p slip on slippery floor and fell  . Cataract extraction w/ intraocular lens implant  2013    bilat  . Colonoscopy w/ polypectomy  05/2013    +diverticulosis; recall 3 yrs (Dr. Henrene Pastor)   Family History  Problem Relation Age of Onset  . Heart disease Mother   . Heart disease Father   . Hypertension Brother   . Colon cancer Neg Hx   . Pancreatic cancer Neg Hx   . Rectal cancer Neg Hx   . Stomach cancer Neg Hx      Medication List       This list is accurate as of: 02/10/15  9:48 AM.  Always use your most recent med list.               acetaminophen 325 MG tablet  Commonly known as:  TYLENOL  Take 650 mg by mouth every 6 (six) hours as needed.     albuterol 108 (90 Base) MCG/ACT inhaler  Commonly known as:  PROVENTIL HFA;VENTOLIN HFA  Inhale 2 puffs into the lungs every 4 (four) hours as needed for  wheezing or shortness of breath (cough, shortness of breath or wheezing.).     ALPRAZolam 0.5 MG tablet  Commonly known as:  XANAX  take 1 tablet by mouth three times a day if needed for STRESS     amLODipine 5 MG tablet  Commonly known as:  NORVASC  Take 1 tablet (5 mg total) by mouth daily.     aspirin 81 MG tablet  Take 81 mg by mouth daily.     atorvastatin 40 MG tablet  Commonly known as:  LIPITOR  take 1 tablet by mouth once daily     B COMPLEX-B12 PO  Take by mouth daily.     calcium-vitamin D 500-200 MG-UNIT tablet  Commonly known as:  OSCAL WITH D  Take 1 tablet by mouth daily.     carboxymethylcellulose 0.5 % Soln  Commonly known as:  REFRESH PLUS  Place 2 drops into both eyes daily.     CENTRUM SILVER ULTRA WOMENS Tabs  Take by mouth.     Coenzyme Q10 200 MG capsule   Take 200 mg by mouth daily.     dextromethorphan-guaiFENesin 30-600 MG 12hr tablet  Commonly known as:  MUCINEX DM  Take 1 tablet by mouth 2 (two) times daily.     Fish Oil 1200 MG Caps  Take by mouth 2 (two) times daily.     furosemide 40 MG tablet  Commonly known as:  LASIX  Take 1 tablet (40 mg total) by mouth daily.     HYDROcodone-homatropine 5-1.5 MG/5ML syrup  Commonly known as:  HYCODAN  Take 5 mLs by mouth every 6 (six) hours as needed for cough.     loratadine 10 MG tablet  Commonly known as:  CLARITIN  Take 10 mg by mouth daily.     losartan 50 MG tablet  Commonly known as:  COZAAR  take 1 tablet by mouth once daily     sodium chloride 0.65 % nasal spray  Commonly known as:  OCEAN  Place 1 spray into the nose daily.     valACYclovir 1000 MG tablet  Commonly known as:  VALTREX  Take 1,000 mg by mouth daily as needed (fever blister). Reported on 02/10/2015     vitamin C 500 MG tablet  Commonly known as:  ASCORBIC ACID  Take 500 mg by mouth 2 (two) times daily.       ROS: Negative, with the exception of above mentioned in HPI  Objective:  BP 133/82 mmHg  Pulse 92  Temp(Src) 98.3 F (36.8 C)  Resp 20  Wt 165 lb 12 oz (75.184 kg)  SpO2 94%  LMP  (LMP Unknown) Body mass index is 29.37 kg/(m^2). Gen: Afebrile. No acute distress. Nontoxic in appearance. Appears tired.  HENT: AT. [place]. Bilateral TM visualized and normal in appearance. MMM, no oral lesions. Bilateral nares without erythema or swelling. Throat without erythema or exudates. Cough and hoarseness present . No TTP facial sinus.  Eyes:Pupils Equal Round Reactive to light, Extraocular movements intact,  Conjunctiva without redness, discharge or icterus. Neck/lymp/endocrine: Supple, left ant cervical lymphadenopathy CV: RRR  Chest: CTAB, no wheeze or crackles. Good air movement, normal resp effort.  Abd: Soft. round. NTND. BS present.  Skin: No rashes, purpura or petechiae.  Neuro: Normal gait.  PERLA. EOMi. Alert. Oriented x3   Assessment/Plan: DOMINGUE COLTRAIN is a 80 y.o. female present for acute OV  1. Acute bronchitis, unspecified organism - rest , hydrated. Mucinex. Humidifier. Antihistamine. - Doxycyline if no  improvement or worsening  in 2 days.  - F/U PRN  2. health maintenance/osteopenia:  - review of prior bone density and medication regimen.  - bone density ordered today. Continue Vit D and calcium daily.  - pt encouraged to make appt for preventive if it has been > 1 year.  Makyiah Lie Raoul Pitch, DO  [place]

## 2015-02-18 HISTORY — PX: OTHER SURGICAL HISTORY: SHX169

## 2015-02-23 ENCOUNTER — Ambulatory Visit: Payer: Medicare Other | Admitting: Family Medicine

## 2015-03-06 ENCOUNTER — Ambulatory Visit (INDEPENDENT_AMBULATORY_CARE_PROVIDER_SITE_OTHER): Payer: Medicare Other | Admitting: Family Medicine

## 2015-03-06 ENCOUNTER — Encounter: Payer: Self-pay | Admitting: Family Medicine

## 2015-03-06 VITALS — BP 147/83 | HR 73 | Temp 97.7°F | Resp 16 | Ht 63.0 in | Wt 161.5 lb

## 2015-03-06 DIAGNOSIS — I10 Essential (primary) hypertension: Secondary | ICD-10-CM | POA: Diagnosis not present

## 2015-03-06 DIAGNOSIS — Z0000 Encounter for general adult medical examination without abnormal findings: Secondary | ICD-10-CM

## 2015-03-06 DIAGNOSIS — E785 Hyperlipidemia, unspecified: Secondary | ICD-10-CM | POA: Diagnosis not present

## 2015-03-06 DIAGNOSIS — M8580 Other specified disorders of bone density and structure, unspecified site: Secondary | ICD-10-CM

## 2015-03-06 LAB — LIPID PANEL
CHOL/HDL RATIO: 3
Cholesterol: 160 mg/dL (ref 0–200)
HDL: 63.8 mg/dL (ref >39.00)
LDL CALC: 79 mg/dL (ref 0–99)
NonHDL: 96.57
TRIGLYCERIDES: 90 mg/dL (ref 0.0–149.0)
VLDL: 18 mg/dL (ref 0.0–40.0)

## 2015-03-06 MED ORDER — ALENDRONATE SODIUM 70 MG PO TABS: 70.0000 mg | ORAL_TABLET | ORAL | Status: DC

## 2015-03-06 NOTE — Progress Notes (Signed)
Pre visit review using our clinic review tool, if applicable. No additional management support is needed unless otherwise documented below in the visit note. 

## 2015-03-06 NOTE — Progress Notes (Signed)
The patient is here for annual Medicare wellness examination and management of other chronic and acute problems. Other problems discussed today: 1) Anxiety--feels good taking an alpraz qAM and qhs. 2) HTN; bp measurements good at home, accidentally got dispenses amlodipine 22m by her pharmacy last month. She will start cutting this in half and taking 1/2 qd and then resume 523mqd tabs. 3) Hyperlipidemia; tolerating statin.  Has not had lipid check since 2014. 4)Osteopenia: discussed DEXA done 02/23/15: T score -2.1 at R and L femur necks.  FRAX 10 yr risk of major osteoporotic fx was 21% so we discussed adding fosamax to her calcium and vit D today.     Chemistry      Component Value Date/Time   NA 145 10/17/2014 1445   K 3.4* 10/17/2014 1445   CL 107 10/17/2014 1445   CO2 29 10/17/2014 1445   BUN 18 10/17/2014 1445   CREATININE 0.97 10/17/2014 1445   CREATININE 0.92 12/07/2012 1122      Component Value Date/Time   CALCIUM 9.6 10/17/2014 1445   ALKPHOS 93 06/03/2011 0942   AST 23 06/03/2011 0942   ALT 22 06/03/2011 0942   BILITOT 0.8 06/03/2011 0942       AWV DATA The risk factors are reflected in the social history.  The roster of all physicians providing medical care to patient is listed in the Snapshot section of the chart.  Activities of daily living:  The patient is 100% independent in all ADLs: dressing, toileting, feeding as well as independent mobility.  Home safety : The patient has smoke detectors in the home. They wear seatbelts. No firearms at home ( firearms are present in the home, kept in a safe fashion). There is no violence in the home.   There is no risks for hepatitis, STDs or HIV. There is no history of blood transfusion. They have no travel history to infectious disease endemic areas of the world.  The patient has seen their dentist in the last six month. They have seen their eye doctor in the last year. They deny any hearing difficulty and have not had  audiologic testing in the last year.  They do not  have excessive sun exposure. Discussed the need for sun protection: hats, long sleeves and use of sunscreen if there is significant sun exposure.   Diet: the importance of a healthy diet is discussed. They do have a healthy diet.  The patient has a regular exercise program: she walks three times per week.  The benefits of regular aerobic exercise were discussed.  Depression screen: there are no signs or vegative symptoms of depression- irritability, change in appetite, anhedonia, sadness/tearfullness.  Cognitive assessment: the patient manages all their financial and personal affairs and is actively engaged. They could relate day,date,year and events; recalled 3/3 objects at 3 minutes; performed clock-face test normally.  The following portions of the patient's history were reviewed and updated as appropriate: allergies, current medications, past family history, past medical history,  past surgical history, past social history  and problem list.  Vision, hearing, body mass index were assessed and reviewed.  BMI is 28.6 today.  During the course of the visit the patient was educated and counseled about appropriate screening and preventive services including :  Annual wellness visit  diabetes screening: will check today colorectal cancer screening: next colonoscopy after 05/2016. recommended immunizations (influenza, pneumococcal, Hep B): UTD on all appropriate vaccines Bone mass measurement: reviewed DEXA from 02/23/15: osteopenic but 10 yr risk  of major osteoporotic fx was 21%--recommended continuing vit D and calcium plus add anti-resorptive med.  Will start fosamax now. Counseling to prevent tobacco use Depression screening Glaucoma screening Hepatitis C virus screening: NA/deferred HIV virus screening Lung cancer screening: N/A Medical nutrition therapy Prostate cancer screening: N/A Screening mammography: UTD Screening pap tests, pelvic  exam, and clinical breast exam: no hx of abnormal paps.  After discussion, we have decided no further pelvic/paps are needed. Ultrasound screening for AAA--N/A.   1) HTN; stable.  The current medical regimen is effective;  continue present plan and medications. Amlodipine dosing is 10m qd. Lytes/cr today.  2) Hyperlipidemia: due for FLP today.  Tolerating statin.  3) Osteopenia with 21% fracture risk calculated by FRAX: continue calcium and vit D, add fosamax 717mq week.  A written plan of action regarding the above screening and preventative services was given to the patient today.

## 2015-03-23 ENCOUNTER — Telehealth: Payer: Self-pay

## 2015-03-23 NOTE — Telephone Encounter (Signed)
Spoke to patient. Went over results/instructions per Dr. Idelle Leech 03/13/15 result note. Patient verbalized understanding. She plans to go to [facility] location to have blood drawn this week. She is aware to fast.

## 2015-03-26 ENCOUNTER — Other Ambulatory Visit: Payer: Self-pay | Admitting: Family Medicine

## 2015-03-26 DIAGNOSIS — Z131 Encounter for screening for diabetes mellitus: Secondary | ICD-10-CM

## 2015-03-26 DIAGNOSIS — I10 Essential (primary) hypertension: Secondary | ICD-10-CM

## 2015-04-09 ENCOUNTER — Encounter: Payer: Self-pay | Admitting: Family Medicine

## 2015-04-10 ENCOUNTER — Ambulatory Visit (INDEPENDENT_AMBULATORY_CARE_PROVIDER_SITE_OTHER): Payer: Medicare Other | Admitting: Family Medicine

## 2015-04-10 ENCOUNTER — Encounter: Payer: Self-pay | Admitting: Family Medicine

## 2015-04-10 VITALS — BP 135/82 | HR 76 | Temp 97.7°F | Resp 16 | Ht 63.0 in | Wt 163.0 lb

## 2015-04-10 DIAGNOSIS — I10 Essential (primary) hypertension: Secondary | ICD-10-CM | POA: Diagnosis not present

## 2015-04-10 NOTE — Progress Notes (Signed)
OFFICE VISIT  04/10/2015   CC:  Chief Complaint  Patient presents with  . Follow-up    HTN. She stated that she went to a health fair and her BP was elevated. She stated that she has been keeping a check on her BP at home ever since and the top number has been 160-170.     HPI:    Patient is a 80 y.o. Caucasian female who presents for f/u HTN. Her bp at a health fair last week was 160.  She has continued to check it some the rest this week and it seems that 213Y is the systolic avg, with one elevation to 170.  She is very nervous/anxious.  Has been stressed a lot lately, esp with her husband's death in the last couple months.  Denies depression but describes almost daily "panic" type symptoms that are brief.  No HA's, no dizziness, no CP, no vision c/o, no flushing, no SOB.  Past Medical History  Diagnosis Date  . Hyperlipidemia   . Hypertension   . Rheumatic fever   . Arthritis   . Anxiety     with panic  . Peripheral edema   . Fracture of radial shaft, left, closed 11/16/10    fell down flight of stairs  . Hx of adenomatous colonic polyps 2002    surveillance colonoscopy 2009, +polypectomy done-tubular adenoma w/out high grade.  Next colonoscopy due after 12/2012  . Diverticulosis 2009 colonoscopy  . Osteopenia     DEXA 08/2010; repeat DEXA 02/2015 worse: fosamax started  . Abnormal EKG approx 2008    Nuclear stress test neg;   . Cataract     s/p surgery--lens implants  . Chronic renal insufficiency, stage II (mild) 12/04/2012    Past Surgical History  Procedure Laterality Date  . Appendectomy  1966    done during surgery for tubal pregnancy  . Tonsillectomy    . Orif radial fracture  11/18/10    left; s/p slip on slippery floor and fell  . Cataract extraction w/ intraocular lens implant  2013    bilat  . Colonoscopy w/ polypectomy  05/2013    +diverticulosis; recall 3 yrs (Dr. Henrene Pastor)  . Dexa  02/2015    T score -2.1 in both femoral necks; FRAX 10 yr risk of major  osteoporotic fracture was 21%---fosamax started    Outpatient Prescriptions Prior to Visit  Medication Sig Dispense Refill  . acetaminophen (TYLENOL) 325 MG tablet Take 650 mg by mouth every 6 (six) hours as needed.    Marland Kitchen albuterol (PROVENTIL HFA;VENTOLIN HFA) 108 (90 BASE) MCG/ACT inhaler Inhale 2 puffs into the lungs every 4 (four) hours as needed for wheezing or shortness of breath (cough, shortness of breath or wheezing.). 1 Inhaler 1  . alendronate (FOSAMAX) 70 MG tablet Take 1 tablet (70 mg total) by mouth every 7 (seven) days. Take with a full glass of water on an empty stomach. 4 tablet 11  . ALPRAZolam (XANAX) 0.5 MG tablet take 1 tablet by mouth three times a day if needed for STRESS 90 tablet 5  . amLODipine (NORVASC) 5 MG tablet Take 1 tablet (5 mg total) by mouth daily. 90 tablet 3  . Ascorbic Acid (VITAMIN C) 500 MG tablet Take 500 mg by mouth 2 (two) times daily.      Marland Kitchen aspirin 81 MG tablet Take 81 mg by mouth daily.    Marland Kitchen atorvastatin (LIPITOR) 40 MG tablet take 1 tablet by mouth once daily 90 tablet 3  .  B Complex Vitamins (B COMPLEX-B12 PO) Take by mouth daily.      . calcium-vitamin D (OSCAL WITH D) 500-200 MG-UNIT per tablet Take 1 tablet by mouth daily.      . carboxymethylcellulose (REFRESH PLUS) 0.5 % SOLN Place 2 drops into both eyes daily.    . Coenzyme Q10 200 MG capsule Take 200 mg by mouth daily.      . furosemide (LASIX) 40 MG tablet Take 1 tablet (40 mg total) by mouth daily. 180 tablet 1  . losartan (COZAAR) 50 MG tablet take 1 tablet by mouth once daily 90 tablet 3  . Multiple Vitamins-Minerals (CENTRUM SILVER ULTRA WOMENS) TABS Take by mouth.      . Omega-3 Fatty Acids (FISH OIL) 1200 MG CAPS Take by mouth 2 (two) times daily.      . sodium chloride (OCEAN) 0.65 % nasal spray Place 1 spray into the nose daily.    . valACYclovir (VALTREX) 1000 MG tablet Take 1,000 mg by mouth daily as needed (fever blister). Reported on 02/10/2015     No facility-administered  medications prior to visit.    Allergies  Allergen Reactions  . Augmentin [Amoxicillin-Pot Clavulanate] Nausea And Vomiting    "projectile vomiting"    ROS As per HPI  PE: Blood pressure 135/82, pulse 76, temperature 97.7 F (36.5 C), temperature source Oral, resp. rate 16, height 5' 3"  (1.6 m), weight 163 lb (73.936 kg), SpO2 95 %. Gen: Alert, well appearing.  Patient is oriented to person, place, time, and situation. AFFECT: pleasant, lucid thought and speech. CV: RRR, no m/r/g.   LUNGS: CTA bilat, nonlabored resps, good aeration in all lung fields. EXT: no clubbing, cyanosis, or edema.   LABS:  None today  IMPRESSION AND PLAN:  HTN, some fluctuations.  Discussed normal bp fluctuations, discussed effects of stress/anxiety on bp, discussed less stringent goal bp in people over age 38.  Decided to not change or increase any meds today.  She will monitor her bp once a day for the next couple weeks and return to go over these with me.  An After Visit Summary was printed and given to the patient.  FOLLOW UP: Return in about 2 weeks (around 04/24/2015) for f/u HTN.  Signed:  Crissie Sickles, MD           04/10/2015

## 2015-04-10 NOTE — Progress Notes (Signed)
Pre visit review using our clinic review tool, if applicable. No additional management support is needed unless otherwise documented below in the visit note. 

## 2015-04-24 ENCOUNTER — Ambulatory Visit: Payer: Medicare Other | Admitting: Family Medicine

## 2015-04-27 ENCOUNTER — Encounter: Payer: Self-pay | Admitting: Family Medicine

## 2015-04-27 ENCOUNTER — Ambulatory Visit (INDEPENDENT_AMBULATORY_CARE_PROVIDER_SITE_OTHER): Payer: Medicare Other | Admitting: Family Medicine

## 2015-04-27 VITALS — BP 150/87 | HR 69 | Temp 97.8°F | Resp 16 | Ht 63.0 in | Wt 166.5 lb

## 2015-04-27 DIAGNOSIS — I10 Essential (primary) hypertension: Secondary | ICD-10-CM

## 2015-04-27 MED ORDER — AMLODIPINE BESYLATE 10 MG PO TABS
10.0000 mg | ORAL_TABLET | Freq: Every day | ORAL | Status: DC
Start: 2015-04-27 — End: 2016-04-05

## 2015-04-27 NOTE — Progress Notes (Signed)
OFFICE VISIT  04/27/2015   CC:  Chief Complaint  Patient presents with  . Follow-up    HTN    HPI:    Patient is a 80 y.o. Caucasian female who presents for 2 week f/u HTN. Monitoring BPs at home more frequently lately due to some stage I and occ stage 2 systolic she was having.  Denies HA, dizziness, SOB, or CP.  No focal or generalized weakness.  No LE edema.  Reviewed 2 wks of home bp's: avg systolic 384 or so, avg diastolic 85 or so.  No HR data. She is very afraid of a stroke.  She is high strung, nervous about everything.   Past Medical History  Diagnosis Date  . Hyperlipidemia   . Hypertension   . Rheumatic fever   . Arthritis   . Anxiety     with panic  . Peripheral edema   . Fracture of radial shaft, left, closed 11/16/10    fell down flight of stairs  . Hx of adenomatous colonic polyps 2002    surveillance colonoscopy 2009, +polypectomy done-tubular adenoma w/out high grade.  Next colonoscopy due after 12/2012  . Diverticulosis 2009 colonoscopy  . Osteopenia     DEXA 08/2010; repeat DEXA 02/2015 worse: fosamax started  . Abnormal EKG approx 2008    Nuclear stress test neg;   . Cataract     s/p surgery--lens implants  . Chronic renal insufficiency, stage II (mild) 12/04/2012    Past Surgical History  Procedure Laterality Date  . Appendectomy  1966    done during surgery for tubal pregnancy  . Tonsillectomy    . Orif radial fracture  11/18/10    left; s/p slip on slippery floor and fell  . Cataract extraction w/ intraocular lens implant  2013    bilat  . Colonoscopy w/ polypectomy  05/2013    +diverticulosis; recall 3 yrs (Dr. Henrene Pastor)  . Dexa  02/2015    T score -2.1 in both femoral necks; FRAX 10 yr risk of major osteoporotic fracture was 21%---fosamax started    Outpatient Prescriptions Prior to Visit  Medication Sig Dispense Refill  . acetaminophen (TYLENOL) 325 MG tablet Take 650 mg by mouth every 6 (six) hours as needed.    Marland Kitchen albuterol (PROVENTIL  HFA;VENTOLIN HFA) 108 (90 BASE) MCG/ACT inhaler Inhale 2 puffs into the lungs every 4 (four) hours as needed for wheezing or shortness of breath (cough, shortness of breath or wheezing.). 1 Inhaler 1  . alendronate (FOSAMAX) 70 MG tablet Take 1 tablet (70 mg total) by mouth every 7 (seven) days. Take with a full glass of water on an empty stomach. 4 tablet 11  . ALPRAZolam (XANAX) 0.5 MG tablet take 1 tablet by mouth three times a day if needed for STRESS 90 tablet 5  . Ascorbic Acid (VITAMIN C) 500 MG tablet Take 500 mg by mouth 2 (two) times daily.      Marland Kitchen aspirin 81 MG tablet Take 81 mg by mouth daily.    Marland Kitchen atorvastatin (LIPITOR) 40 MG tablet take 1 tablet by mouth once daily 90 tablet 3  . B Complex Vitamins (B COMPLEX-B12 PO) Take by mouth daily.      . calcium-vitamin D (OSCAL WITH D) 500-200 MG-UNIT per tablet Take 1 tablet by mouth daily.      . carboxymethylcellulose (REFRESH PLUS) 0.5 % SOLN Place 2 drops into both eyes daily.    . Coenzyme Q10 200 MG capsule Take 200 mg by mouth  daily.      . furosemide (LASIX) 40 MG tablet Take 1 tablet (40 mg total) by mouth daily. 180 tablet 1  . losartan (COZAAR) 50 MG tablet take 1 tablet by mouth once daily 90 tablet 3  . Multiple Vitamins-Minerals (CENTRUM SILVER ULTRA WOMENS) TABS Take by mouth.      . Omega-3 Fatty Acids (FISH OIL) 1200 MG CAPS Take by mouth 2 (two) times daily.      . sodium chloride (OCEAN) 0.65 % nasal spray Place 1 spray into the nose daily.    . valACYclovir (VALTREX) 1000 MG tablet Take 1,000 mg by mouth daily as needed (fever blister). Reported on 02/10/2015    . amLODipine (NORVASC) 5 MG tablet Take 1 tablet (5 mg total) by mouth daily. 90 tablet 3   No facility-administered medications prior to visit.    Allergies  Allergen Reactions  . Augmentin [Amoxicillin-Pot Clavulanate] Nausea And Vomiting    "projectile vomiting"    ROS As per HPI  PE: Blood pressure 150/87, pulse 69, temperature 97.8 F (36.6 C),  temperature source Oral, resp. rate 16, height 5' 3"  (1.6 m), weight 166 lb 8 oz (75.524 kg), SpO2 95 %. Gen: Alert, well appearing.  Patient is oriented to person, place, time, and situation. AFFECT: pleasant, lucid thought and speech. EXT: no c/c/e  LABS:    Chemistry      Component Value Date/Time   NA 145 10/17/2014 1445   K 3.4* 10/17/2014 1445   CL 107 10/17/2014 1445   CO2 29 10/17/2014 1445   BUN 18 10/17/2014 1445   CREATININE 0.97 10/17/2014 1445   CREATININE 0.92 12/07/2012 1122      Component Value Date/Time   CALCIUM 9.6 10/17/2014 1445   ALKPHOS 93 06/03/2011 0942   AST 23 06/03/2011 0942   ALT 22 06/03/2011 0942   BILITOT 0.8 06/03/2011 0942     IMPRESSION AND PLAN:  1) Essential HTN: control technically ok for her age, BUT her GFR is in the 39s, so we should really be keeping her at a goal of <140/90, if not a little better. Increase amlodipine to 71m qd today, continue losartan at 542mqd for now. She'll continue daily home bp monitoring.  An After Visit Summary was printed and given to the patient.  FOLLOW UP: Return in about 3 weeks (around 05/18/2015) for annual CPE (fasting).  Signed:  PhCrissie SicklesMD           04/27/2015

## 2015-04-27 NOTE — Progress Notes (Signed)
Pre visit review using our clinic review tool, if applicable. No additional management support is needed unless otherwise documented below in the visit note. 

## 2015-05-11 ENCOUNTER — Other Ambulatory Visit: Payer: Self-pay | Admitting: Family Medicine

## 2015-05-11 NOTE — Telephone Encounter (Signed)
Rx faxed

## 2015-05-11 NOTE — Telephone Encounter (Signed)
RF request for alprazolam LOV: 04/27/15 Next ov: 05/25/15 Last written: 10/15/14 #90 w/ 5RF  Please advise. Thanks.

## 2015-05-25 ENCOUNTER — Ambulatory Visit (INDEPENDENT_AMBULATORY_CARE_PROVIDER_SITE_OTHER): Payer: Medicare Other | Admitting: Family Medicine

## 2015-05-25 ENCOUNTER — Encounter: Payer: Self-pay | Admitting: Family Medicine

## 2015-05-25 VITALS — BP 149/86 | HR 74 | Temp 97.5°F | Ht 62.5 in | Wt 166.8 lb

## 2015-05-25 DIAGNOSIS — Z0000 Encounter for general adult medical examination without abnormal findings: Secondary | ICD-10-CM | POA: Diagnosis not present

## 2015-05-25 LAB — CBC WITH DIFFERENTIAL/PLATELET
BASOS PCT: 0.6 % (ref 0.0–3.0)
Basophils Absolute: 0 10*3/uL (ref 0.0–0.1)
EOS ABS: 0.2 10*3/uL (ref 0.0–0.7)
EOS PCT: 3.2 % (ref 0.0–5.0)
HCT: 43.6 % (ref 36.0–46.0)
HEMOGLOBIN: 14.4 g/dL (ref 12.0–15.0)
LYMPHS ABS: 2.2 10*3/uL (ref 0.7–4.0)
Lymphocytes Relative: 34.4 % (ref 12.0–46.0)
MCHC: 33.2 g/dL (ref 30.0–36.0)
MCV: 87.9 fl (ref 78.0–100.0)
MONO ABS: 0.3 10*3/uL (ref 0.1–1.0)
Monocytes Relative: 5.3 % (ref 3.0–12.0)
NEUTROS PCT: 56.5 % (ref 43.0–77.0)
Neutro Abs: 3.6 10*3/uL (ref 1.4–7.7)
Platelets: 268 10*3/uL (ref 150.0–400.0)
RBC: 4.96 Mil/uL (ref 3.87–5.11)
RDW: 14.7 % (ref 11.5–15.5)
WBC: 6.4 10*3/uL (ref 4.0–10.5)

## 2015-05-25 LAB — COMPREHENSIVE METABOLIC PANEL
ALBUMIN: 4.4 g/dL (ref 3.5–5.2)
ALT: 17 U/L (ref 0–35)
AST: 18 U/L (ref 0–37)
Alkaline Phosphatase: 76 U/L (ref 39–117)
BUN: 18 mg/dL (ref 6–23)
CHLORIDE: 105 meq/L (ref 96–112)
CO2: 26 meq/L (ref 19–32)
CREATININE: 0.9 mg/dL (ref 0.40–1.20)
Calcium: 9.5 mg/dL (ref 8.4–10.5)
GFR: 64.31 mL/min (ref >60.00)
GLUCOSE: 102 mg/dL — AB (ref 70–99)
POTASSIUM: 3.7 meq/L (ref 3.5–5.1)
SODIUM: 140 meq/L (ref 135–145)
Total Bilirubin: 0.9 mg/dL (ref 0.2–1.2)
Total Protein: 7 g/dL (ref 6.0–8.3)

## 2015-05-25 NOTE — Progress Notes (Signed)
Office Note 05/25/2015  CC: CPE  HPI:  Brenda Matthews is a 80 y.o. White female who is here for annual health maintenance exam. Avg home bp 140s/80s. Hx of ALL normal pap smears.  No longer getting.    She is getting full skin exam by dermatologist soon, plus an eye MD check up and dental check up soon.  No acute complaints.  Past Medical History  Diagnosis Date  . Hyperlipidemia   . Hypertension   . Rheumatic fever   . Arthritis   . Anxiety     with panic  . Peripheral edema   . Fracture of radial shaft, left, closed 11/16/10    fell down flight of stairs  . Hx of adenomatous colonic polyps 2002    surveillance colonoscopy 2009, +polypectomy done-tubular adenoma w/out high grade.  05/2013 tubular adenomas--recall 3 yrs  . Diverticulosis 2009 colonoscopy  . Osteopenia     DEXA 08/2010; repeat DEXA 02/2015 worse: fosamax started  . Abnormal EKG approx 2008    Nuclear stress test neg;   . Cataract     s/p surgery--lens implants  . Chronic renal insufficiency, stage II (mild) 12/04/2012    Past Surgical History  Procedure Laterality Date  . Appendectomy  1966    done during surgery for tubal pregnancy  . Tonsillectomy    . Orif radial fracture  11/18/10    left; s/p slip on slippery floor and fell  . Cataract extraction w/ intraocular lens implant  2013    bilat  . Colonoscopy w/ polypectomy  05/2013    +diverticulosis; recall 3 yrs (Dr. Henrene Pastor)  . Dexa  02/2015    T score -2.1 in both femoral necks; FRAX 10 yr risk of major osteoporotic fracture was 21%---fosamax started    Family History  Problem Relation Age of Onset  . Heart disease Mother   . Heart disease Father   . Hypertension Brother   . Colon cancer Neg Hx   . Pancreatic cancer Neg Hx   . Rectal cancer Neg Hx   . Stomach cancer Neg Hx     Social History   Social History  . Marital Status: Married    Spouse Name: N/A  . Number of Children: N/A  . Years of Education: N/A   Occupational History  .  Not on file.   Social History Main Topics  . Smoking status: Never Smoker   . Smokeless tobacco: Never Used  . Alcohol Use: Yes     Comment: occ  . Drug Use: No  . Sexual Activity: Not on file   Other Topics Concern  . Not on file   Social History Narrative   Married, 2 sons.   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   No tobacco.  Rare alcohol.   No drugs.  Exercise: 4 times per week, about 26m.    Outpatient Prescriptions Prior to Visit  Medication Sig Dispense Refill  . acetaminophen (TYLENOL) 325 MG tablet Take 650 mg by mouth every 6 (six) hours as needed.    .Marland Kitchenalbuterol (PROVENTIL HFA;VENTOLIN HFA) 108 (90 BASE) MCG/ACT inhaler Inhale 2 puffs into the lungs every 4 (four) hours as needed for wheezing or shortness of breath (cough, shortness of breath or wheezing.). 1 Inhaler 1  . alendronate (FOSAMAX) 70 MG tablet Take 1 tablet (70 mg total) by mouth every 7 (seven) days. Take with a full glass of water on an empty stomach. 4 tablet 11  . ALPRAZolam (XANAX) 0.5 MG tablet  take 1 tablet by mouth three times a day if needed for STRESS 90 tablet 5  . amLODipine (NORVASC) 10 MG tablet Take 1 tablet (10 mg total) by mouth daily. 90 tablet 3  . Ascorbic Acid (VITAMIN C) 500 MG tablet Take 500 mg by mouth 2 (two) times daily.      Marland Kitchen aspirin 81 MG tablet Take 81 mg by mouth daily.    Marland Kitchen atorvastatin (LIPITOR) 40 MG tablet take 1 tablet by mouth once daily 90 tablet 3  . B Complex Vitamins (B COMPLEX-B12 PO) Take by mouth daily.      . calcium-vitamin D (OSCAL WITH D) 500-200 MG-UNIT per tablet Take 1 tablet by mouth daily.      . carboxymethylcellulose (REFRESH PLUS) 0.5 % SOLN Place 2 drops into both eyes daily.    . Coenzyme Q10 200 MG capsule Take 200 mg by mouth daily.      . furosemide (LASIX) 40 MG tablet Take 1 tablet (40 mg total) by mouth daily. 180 tablet 1  . losartan (COZAAR) 50 MG tablet take 1 tablet by mouth once daily 90 tablet 3  . Multiple Vitamins-Minerals (CENTRUM SILVER  ULTRA WOMENS) TABS Take by mouth.      . Omega-3 Fatty Acids (FISH OIL) 1200 MG CAPS Take by mouth 2 (two) times daily.      . sodium chloride (OCEAN) 0.65 % nasal spray Place 1 spray into the nose daily.    . valACYclovir (VALTREX) 1000 MG tablet Take 1,000 mg by mouth daily as needed (fever blister). Reported on 02/10/2015     No facility-administered medications prior to visit.    Allergies  Allergen Reactions  . Augmentin [Amoxicillin-Pot Clavulanate] Nausea And Vomiting    "projectile vomiting"    ROS Review of Systems  Constitutional: Negative for fever, chills, appetite change and fatigue.  HENT: Negative for congestion, dental problem, ear pain and sore throat.   Eyes: Negative for discharge, redness and visual disturbance.  Respiratory: Negative for cough, chest tightness, shortness of breath and wheezing.   Cardiovascular: Negative for chest pain, palpitations and leg swelling.  Gastrointestinal: Negative for nausea, vomiting, abdominal pain, diarrhea and blood in stool.  Genitourinary: Negative for dysuria, urgency, frequency, hematuria, flank pain and difficulty urinating.  Musculoskeletal: Negative for myalgias, back pain, joint swelling, arthralgias and neck stiffness.  Skin: Negative for pallor and rash.  Neurological: Negative for dizziness, speech difficulty, weakness and headaches.  Hematological: Negative for adenopathy. Does not bruise/bleed easily.  Psychiatric/Behavioral: Negative for confusion and sleep disturbance. The patient is not nervous/anxious.     PE; Blood pressure 149/86, pulse 74, temperature 97.5 F (36.4 C), temperature source Oral, height 5' 2.5" (1.588 m), weight 166 lb 12.8 oz (75.66 kg), SpO2 94 %.  Exam chaperoned by CMA Cammie Mcgee Gen: Alert, well appearing.  Patient is oriented to person, place, time, and situation. AFFECT: pleasant, lucid thought and speech. ENT: Ears: EACs clear, normal epithelium.  TMs with good light reflex and  landmarks bilaterally.  Eyes: no injection, icteris, swelling, or exudate.  EOMI, PERRLA. Nose: no drainage or turbinate edema/swelling.  No injection or focal lesion.  Mouth: lips without lesion/swelling.  Oral mucosa pink and moist.  Dentition intact and without obvious caries or gingival swelling.  Oropharynx without erythema, exudate, or swelling.  Neck: supple/nontender.  No LAD, mass, or TM.  Carotid pulses 2+ bilaterally, without bruits. CV: RRR, no m/r/g.   LUNGS: CTA bilat, nonlabored resps, good aeration in all lung  fields. ABD: soft, NT, ND, BS normal.  No hepatospenomegaly or mass.  No bruits. EXT: no clubbing, cyanosis, or edema.  Musculoskeletal: no joint swelling, erythema, warmth, or tenderness.  ROM of all joints intact. Skin - no sores or suspicious lesions or rashes or color changes  Pertinent labs:  Lab Results  Component Value Date   TSH 0.79 10/17/2014   Lab Results  Component Value Date   WBC 7.5 06/11/2014   HGB 13.4 06/11/2014   HCT 42.9 06/11/2014   MCV 89.3 06/11/2014   PLT 218.0 05/25/2010   Lab Results  Component Value Date   CREATININE 0.97 10/17/2014   BUN 18 10/17/2014   NA 145 10/17/2014   K 3.4* 10/17/2014   CL 107 10/17/2014   CO2 29 10/17/2014   Lab Results  Component Value Date   ALT 22 06/03/2011   AST 23 06/03/2011   ALKPHOS 93 06/03/2011   BILITOT 0.8 06/03/2011   Lab Results  Component Value Date   CHOL 160 03/06/2015   Lab Results  Component Value Date   HDL 63.80 03/06/2015   Lab Results  Component Value Date   LDLCALC 79 03/06/2015   Lab Results  Component Value Date   TRIG 90.0 03/06/2015   Lab Results  Component Value Date   CHOLHDL 3 03/06/2015   ASSESSMENT AND PLAN:   Health maintenance exam:  Reviewed age and gender appropriate health maintenance issues (prudent diet, regular exercise, health risks of tobacco and excessive alcohol, use of seatbelts, fire alarms in home, use of sunscreen).  Also reviewed age  and gender appropriate health screening as well as vaccine recommendations. No vaccines due. Mammogram UTD. Colon ca screening UTD: she and GI will discuss possible repeat colonoscopy after 05/2016. Cervical ca screening: no longer getting this. DEXA was this year: she is tolerating fosamax well.  An After Visit Summary was printed and given to the patient.  FOLLOW UP:  Return in about 6 months (around 11/25/2015) for routine chronic illness f/u.  Signed:  Crissie Sickles, MD           05/25/2015

## 2015-05-25 NOTE — Progress Notes (Signed)
Pre visit review using our clinic review tool, if applicable. No additional management support is needed unless otherwise documented below in the visit note. 

## 2015-07-06 ENCOUNTER — Other Ambulatory Visit: Payer: Self-pay | Admitting: Family Medicine

## 2015-07-06 NOTE — Telephone Encounter (Signed)
LOV: 03/06/15 NOV: 11/23/15  RF request for losartan Last written: 06/11/14 #90 w/ 3Rf  RF request for atorvastatin Last written: 11/23/15 #90 w/ Waylan Rocher

## 2015-07-23 ENCOUNTER — Ambulatory Visit: Payer: Medicare Other | Admitting: Podiatry

## 2015-07-27 ENCOUNTER — Ambulatory Visit (INDEPENDENT_AMBULATORY_CARE_PROVIDER_SITE_OTHER): Payer: Medicare Other | Admitting: Podiatry

## 2015-07-27 ENCOUNTER — Encounter: Payer: Self-pay | Admitting: Podiatry

## 2015-07-27 VITALS — BP 135/82 | HR 63 | Resp 16

## 2015-07-27 DIAGNOSIS — M79673 Pain in unspecified foot: Secondary | ICD-10-CM

## 2015-07-27 DIAGNOSIS — M2040 Other hammer toe(s) (acquired), unspecified foot: Secondary | ICD-10-CM

## 2015-07-27 DIAGNOSIS — L84 Corns and callosities: Secondary | ICD-10-CM | POA: Diagnosis not present

## 2015-07-27 DIAGNOSIS — B351 Tinea unguium: Secondary | ICD-10-CM | POA: Diagnosis not present

## 2015-07-28 NOTE — Progress Notes (Signed)
Subjective:     Patient ID: Brenda Matthews, female   DOB: 1935-10-18, 80 y.o.   MRN: [id number]  HPI long-term diabetic who presents with nail disease thickness yellow brittle debris pain and lesions on the toes which can be bothersome for her   Review of Systems  All other systems reviewed and are negative.      Objective:   Physical Exam  Constitutional: She is oriented to person, place, and time.  Musculoskeletal: Normal range of motion.  Neurological: She is oriented to person, place, and time.  Skin: Skin is warm and dry.  Nursing note and vitals reviewed.  neurovascular status found to be diminished but intact with diminished PT DP pulses and diminished vibratory and sharp dull. Patient is noted to have thick yellow brittle nailbeds 1-5 both feet and moderate foot structural issues and also is noted to have lesions on the plantar aspect of both feet that can become painful. Patient has digital deformities with redness and pre-ulcerated of type lesions     Assessment:     Long-term diabetic with mycotic nail infections lesion formation    Plan:     H&P conditions reviewed debridement accomplished padding accomplished and diabetic education rendered. Discussed long-term diabetic shoes which I think will be of benefit to her and we will get approval for these to be made

## 2015-08-04 ENCOUNTER — Other Ambulatory Visit: Payer: Self-pay | Admitting: Family Medicine

## 2015-08-04 ENCOUNTER — Other Ambulatory Visit: Payer: Self-pay | Admitting: *Deleted

## 2015-08-04 MED ORDER — ALBUTEROL SULFATE HFA 108 (90 BASE) MCG/ACT IN AERS
2.0000 | INHALATION_SPRAY | RESPIRATORY_TRACT | Status: DC | PRN
Start: 2015-08-04 — End: 2016-01-07

## 2015-08-04 NOTE — Telephone Encounter (Signed)
RF request for ventolin LOV: 05/25/15 Next ov: 11/23/15 Last written: 10/25/14 #1 w/ 1RF

## 2015-08-04 NOTE — Telephone Encounter (Signed)
RF request for furosemide LOV: 06/04/15 Next ov: 11/23/15 Last written: 06/04/13 #180 w/ 1RF

## 2015-11-23 ENCOUNTER — Ambulatory Visit: Payer: Medicare Other | Admitting: Family Medicine

## 2015-12-06 ENCOUNTER — Other Ambulatory Visit: Payer: Self-pay | Admitting: Family Medicine

## 2015-12-07 ENCOUNTER — Telehealth: Payer: Self-pay | Admitting: Family Medicine

## 2015-12-07 NOTE — Telephone Encounter (Signed)
opnened in error

## 2015-12-07 NOTE — Telephone Encounter (Signed)
Rx faxed

## 2015-12-07 NOTE — Telephone Encounter (Signed)
[facility].  RF request for alprazolam LOV: 05/25/15 Next ov: 12/16/15 Last written: 05/11/15 #90 w/ 5RF  Please advise. Thanks.

## 2015-12-16 ENCOUNTER — Encounter: Payer: Self-pay | Admitting: Family Medicine

## 2015-12-16 ENCOUNTER — Ambulatory Visit (INDEPENDENT_AMBULATORY_CARE_PROVIDER_SITE_OTHER): Payer: Medicare Other | Admitting: Family Medicine

## 2015-12-16 VITALS — BP 121/72 | HR 84 | Temp 98.1°F | Resp 16 | Ht 62.5 in | Wt 166.0 lb

## 2015-12-16 DIAGNOSIS — E7800 Pure hypercholesterolemia, unspecified: Secondary | ICD-10-CM | POA: Diagnosis not present

## 2015-12-16 DIAGNOSIS — N182 Chronic kidney disease, stage 2 (mild): Secondary | ICD-10-CM | POA: Diagnosis not present

## 2015-12-16 DIAGNOSIS — I10 Essential (primary) hypertension: Secondary | ICD-10-CM

## 2015-12-16 LAB — LIPID PANEL
CHOLESTEROL: 146 mg/dL (ref 0–200)
HDL: 57.1 mg/dL (ref >39.00)
LDL CALC: 62 mg/dL (ref 0–99)
NonHDL: 89.02
TRIGLYCERIDES: 135 mg/dL (ref 0.0–149.0)
Total CHOL/HDL Ratio: 3
VLDL: 27 mg/dL (ref 0.0–40.0)

## 2015-12-16 LAB — BASIC METABOLIC PANEL
BUN: 16 mg/dL (ref 6–23)
CO2: 28 mEq/L (ref 19–32)
Calcium: 9.6 mg/dL (ref 8.4–10.5)
Chloride: 105 mEq/L (ref 96–112)
Creatinine, Ser: 0.94 mg/dL (ref 0.40–1.20)
GFR: 61.08 mL/min (ref >60.00)
GLUCOSE: 103 mg/dL — AB (ref 70–99)
POTASSIUM: 3.7 meq/L (ref 3.5–5.1)
Sodium: 143 mEq/L (ref 135–145)

## 2015-12-16 NOTE — Progress Notes (Signed)
Pre visit review using our clinic review tool, if applicable. No additional management support is needed unless otherwise documented below in the visit note. 

## 2015-12-16 NOTE — Progress Notes (Signed)
OFFICE VISIT  12/16/2015   CC:  Chief Complaint  Patient presents with  . Follow-up    Pt is fasting.    HPI:    Patient is a 80 y.o. Caucasian female who presents for 6 mo f/u HTN, HLD, CRI stage II. She is fasting.  BP: consistently 130s syst.  She doesn't pay attention to diastolic or HR.  HLD: tolerating atorva w/out problem.  No exercise currently. She tries to eat a heart healthy diet.  Past Medical History:  Diagnosis Date  . Abnormal EKG approx 2008   Nuclear stress test neg;   . Anxiety    with panic  . Arthritis   . Cataract    s/p surgery--lens implants  . Chronic renal insufficiency, stage II (mild) 12/04/2012  . Diverticulosis 2009 colonoscopy  . Fracture of radial shaft, left, closed 11/16/10   fell down flight of stairs  . Hx of adenomatous colonic polyps 2002   surveillance colonoscopy 2009, +polypectomy done-tubular adenoma w/out high grade.  05/2013 tubular adenomas--recall 3 yrs  . Hyperlipidemia   . Hypertension   . Osteopenia    DEXA 08/2010; repeat DEXA 02/2015 worse: fosamax started  . Peripheral edema   . Rheumatic fever     Past Surgical History:  Procedure Laterality Date  . APPENDECTOMY  1966   done during surgery for tubal pregnancy  . CATARACT EXTRACTION W/ INTRAOCULAR LENS IMPLANT  2013   bilat  . COLONOSCOPY W/ POLYPECTOMY  05/2013   +diverticulosis; recall 3 yrs (Dr. Henrene Pastor)  . DEXA  02/2015   T score -2.1 in both femoral necks; FRAX 10 yr risk of major osteoporotic fracture was 21%---fosamax started  . ORIF RADIAL FRACTURE  11/18/10   left; s/p slip on slippery floor and fell  . TONSILLECTOMY      Outpatient Medications Prior to Visit  Medication Sig Dispense Refill  . acetaminophen (TYLENOL) 325 MG tablet Take 650 mg by mouth every 6 (six) hours as needed.    Marland Kitchen albuterol (PROVENTIL HFA;VENTOLIN HFA) 108 (90 Base) MCG/ACT inhaler Inhale 2 puffs into the lungs every 4 (four) hours as needed for wheezing or shortness of breath  (cough, shortness of breath or wheezing.). 1 Inhaler 1  . alendronate (FOSAMAX) 70 MG tablet Take 1 tablet (70 mg total) by mouth every 7 (seven) days. Take with a full glass of water on an empty stomach. 4 tablet 11  . ALPRAZolam (XANAX) 0.5 MG tablet take 1 tablet by mouth three times a day if needed for STRESS 90 tablet 5  . amLODipine (NORVASC) 10 MG tablet Take 1 tablet (10 mg total) by mouth daily. 90 tablet 3  . Ascorbic Acid (VITAMIN C) 500 MG tablet Take 500 mg by mouth 2 (two) times daily.      Marland Kitchen aspirin 81 MG tablet Take 81 mg by mouth daily.    Marland Kitchen atorvastatin (LIPITOR) 40 MG tablet take 1 tablet by mouth once daily 90 tablet 3  . B Complex Vitamins (B COMPLEX-B12 PO) Take by mouth daily.      . calcium-vitamin D (OSCAL WITH D) 500-200 MG-UNIT per tablet Take 1 tablet by mouth daily.      . carboxymethylcellulose (REFRESH PLUS) 0.5 % SOLN Place 2 drops into both eyes daily.    . Coenzyme Q10 200 MG capsule Take 200 mg by mouth daily.      . furosemide (LASIX) 40 MG tablet take 1 tablet by mouth twice a day (Patient taking differently: take 1  tablet by mouth daily) 180 tablet 1  . losartan (COZAAR) 50 MG tablet take 1 tablet by mouth once daily 90 tablet 3  . Multiple Vitamins-Minerals (CENTRUM SILVER ULTRA WOMENS) TABS Take by mouth.      . Omega-3 Fatty Acids (FISH OIL) 1200 MG CAPS Take by mouth 2 (two) times daily.      . sodium chloride (OCEAN) 0.65 % nasal spray Place 1 spray into the nose daily.     No facility-administered medications prior to visit.     Allergies  Allergen Reactions  . Augmentin [Amoxicillin-Pot Clavulanate] Nausea And Vomiting    "projectile vomiting"    ROS As per HPI  PE: Blood pressure 121/72, pulse 84, temperature 98.1 F (36.7 C), temperature source Oral, resp. rate 16, height 5' 2.5" (1.588 m), weight 166 lb (75.3 kg), SpO2 95 %. Gen: Alert, well appearing.  Patient is oriented to person, place, time, and situation. AFFECT: pleasant, lucid  thought and speech. CV: RRR, no m/r/g.   LUNGS: CTA bilat, nonlabored resps, good aeration in all lung fields. EXT: 1-2+ pitting edema bilat LL's--she is wearing compression stockings.  LABS:    Chemistry      Component Value Date/Time   NA 140 05/25/2015 1023   K 3.7 05/25/2015 1023   CL 105 05/25/2015 1023   CO2 26 05/25/2015 1023   BUN 18 05/25/2015 1023   CREATININE 0.90 05/25/2015 1023   CREATININE 0.92 12/07/2012 1122      Component Value Date/Time   CALCIUM 9.5 05/25/2015 1023   ALKPHOS 76 05/25/2015 1023   AST 18 05/25/2015 1023   ALT 17 05/25/2015 1023   BILITOT 0.9 05/25/2015 1023     Lab Results  Component Value Date   CHOL 160 03/06/2015   HDL 63.80 03/06/2015   LDLCALC 79 03/06/2015   TRIG 90.0 03/06/2015   CHOLHDL 3 03/06/2015     IMPRESSION AND PLAN:  1) HTN; The current medical regimen is effective;  continue present plan and medications. Lytes/cr today.  2) Hyperlipidemia: tolerating statin.  Check FLP today.  AST and ALT normal 6 mo ago.  3) CRI stage II: BMET today.  She avoids NSAIDs.  An After Visit Summary was printed and given to the patient.  FOLLOW UP: Return in about 6 months (around 06/14/2016) for annual CPE (fasting).  Signed:  Crissie Sickles, MD           12/16/2015

## 2016-01-07 ENCOUNTER — Other Ambulatory Visit: Payer: Self-pay | Admitting: Family Medicine

## 2016-02-07 ENCOUNTER — Emergency Department (HOSPITAL_COMMUNITY): Payer: Medicare Other

## 2016-02-07 ENCOUNTER — Emergency Department (HOSPITAL_COMMUNITY)
Admission: EM | Admit: 2016-02-07 | Discharge: 2016-02-07 | Disposition: A | Discharge status: Home / Self Care | Payer: Medicare Other | Attending: Emergency Medicine | Admitting: Emergency Medicine

## 2016-02-07 ENCOUNTER — Encounter (HOSPITAL_COMMUNITY): Payer: Self-pay | Admitting: Emergency Medicine

## 2016-02-07 DIAGNOSIS — I10 Essential (primary) hypertension: Secondary | ICD-10-CM | POA: Diagnosis not present

## 2016-02-07 DIAGNOSIS — Y9289 Other specified places as the place of occurrence of the external cause: Secondary | ICD-10-CM | POA: Diagnosis not present

## 2016-02-07 DIAGNOSIS — Z79899 Other long term (current) drug therapy: Secondary | ICD-10-CM | POA: Diagnosis not present

## 2016-02-07 DIAGNOSIS — W010XXA Fall on same level from slipping, tripping and stumbling without subsequent striking against object, initial encounter: Secondary | ICD-10-CM | POA: Insufficient documentation

## 2016-02-07 DIAGNOSIS — Y999 Unspecified external cause status: Secondary | ICD-10-CM | POA: Diagnosis not present

## 2016-02-07 DIAGNOSIS — Z7982 Long term (current) use of aspirin: Secondary | ICD-10-CM | POA: Diagnosis not present

## 2016-02-07 DIAGNOSIS — Y939 Activity, unspecified: Secondary | ICD-10-CM | POA: Diagnosis not present

## 2016-02-07 DIAGNOSIS — S82842A Displaced bimalleolar fracture of left lower leg, initial encounter for closed fracture: Secondary | ICD-10-CM | POA: Insufficient documentation

## 2016-02-07 DIAGNOSIS — S8992XA Unspecified injury of left lower leg, initial encounter: Secondary | ICD-10-CM | POA: Diagnosis present

## 2016-02-07 MED ORDER — HYDROCODONE-ACETAMINOPHEN 5-325 MG PO TABS
1.0000 | ORAL_TABLET | ORAL | Status: AC
Start: 2016-02-07 — End: 2016-02-07
  Administered 2016-02-07: 1 via ORAL
  Filled 2016-02-07: qty 1

## 2016-02-07 MED ORDER — ONDANSETRON HCL 4 MG/2ML IJ SOLN
4.0000 mg | Freq: Once | INTRAMUSCULAR | Status: AC
  Administered 2016-02-07: 4 mg via INTRAVENOUS
  Filled 2016-02-07: qty 2

## 2016-02-07 MED ORDER — HYDROCODONE-ACETAMINOPHEN 5-325 MG PO TABS: 1.0000 | ORAL_TABLET | ORAL | 0 refills | Status: DC | PRN

## 2016-02-07 NOTE — ED Notes (Signed)
Patient given walker and assisted with using.

## 2016-02-07 NOTE — ED Triage Notes (Signed)
Patient BIB [facility] after slipping on ice and rolling L ankle at [facility] center. Witnessed fall, did not hit head, no LOC. Slight swelling noted to ankle, unable to bare weight. 20g RFA, 100 MCG fentanyl given in route.

## 2016-02-07 NOTE — Discharge Instructions (Signed)
Keep your leg elevated, ice to help with the swelling, do not put any weight on your foot, follow up with Dr Aluisio's office, call to schedule an appointment

## 2016-02-07 NOTE — ED Provider Notes (Signed)
[facility] DEPT Provider Note   CSN: [id number] Arrival date & time: 02/07/16  1945     History   Chief Complaint Chief Complaint  Patient presents with  . Ankle Pain    HPI Brenda Matthews is a 81 y.o. female.  HPI Patient presents to the emergency room for evaluation of a left ankle injury. Patient was out shopping when she slipped on ice. She's not sure how she twisted her ankle but when she landed on the ground she had swelling and severe pain in her left ankle. She was not able to stand. EMS was called. They gave her 100 g of fentanyl and transported her to the emergency room. Patient denies any head injury. She denies any knee pain. She denies any upper extremity pain. She denies any numbness or weakness. She is able to wiggle her toes but does not think she can put any weight on her ankle Past Medical History:  Diagnosis Date  . Abnormal EKG approx 2008   Nuclear stress test neg;   . Anxiety    with panic  . Arthritis   . Cataract    s/p surgery--lens implants  . Chronic renal insufficiency, stage II (mild) 12/04/2012   GFR 60s  . Diverticulosis 2009 colonoscopy  . Fracture of radial shaft, left, closed 11/16/10   fell down flight of stairs  . Hx of adenomatous colonic polyps 2002   surveillance colonoscopy 2009, +polypectomy done-tubular adenoma w/out high grade.  05/2013 tubular adenomas--recall 3 yrs  . Hyperlipidemia   . Hypertension   . Osteopenia    DEXA 08/2010; repeat DEXA 02/2015 worse: fosamax started  . Peripheral edema   . Rheumatic fever     Patient Active Problem List   Diagnosis Date Noted  . Acute bronchitis 02/10/2015  . Bronchopneumonia 12/02/2013  . Chronic renal insufficiency, stage II (mild) 12/04/2012  . Hx of adenomatous colonic polyps   . Routine general medical examination at a health care facility 09/05/2011  . Cervical cancer screening 02/24/2011  . SCIATICA, BILATERAL 09/01/2009  . SACROILIAC STRAIN, ACUTE 09/01/2009  . ANEMIA  11/07/2007  . Osteopenia of the elderly 11/07/2007  . INSOMNIA 11/07/2007  . EDEMA 08/14/2007  . Hyperlipidemia 04/04/2007  . PANIC DISORDER 04/04/2007  . HTN (hypertension) 04/04/2007  . ANXIETY 12/27/2006  . ARTHRITIS 12/27/2006    Past Surgical History:  Procedure Laterality Date  . APPENDECTOMY  1966   done during surgery for tubal pregnancy  . CATARACT EXTRACTION W/ INTRAOCULAR LENS IMPLANT  2013   bilat  . COLONOSCOPY W/ POLYPECTOMY  05/2013   +diverticulosis; recall 3 yrs (Dr. Henrene Pastor)  . DEXA  02/2015   T score -2.1 in both femoral necks; FRAX 10 yr risk of major osteoporotic fracture was 21%---fosamax started  . ORIF RADIAL FRACTURE  11/18/10   left; s/p slip on slippery floor and fell  . TONSILLECTOMY      OB History    No data available       Home Medications    Prior to Admission medications   Medication Sig Start Date End Date Taking? Authorizing Provider  acetaminophen (TYLENOL) 325 MG tablet Take 650 mg by mouth every 6 (six) hours as needed for moderate pain.    Yes Historical Provider, MD  alendronate (FOSAMAX) 70 MG tablet Take 1 tablet (70 mg total) by mouth every 7 (seven) days. Take with a full glass of water on an empty stomach. 03/06/15  Yes [name], MD  ALPRAZolam Duanne Moron)  0.5 MG tablet take 1 tablet by mouth three times a day if needed for STRESS 12/07/15  Yes [name], MD  amLODipine (NORVASC) 10 MG tablet Take 1 tablet (10 mg total) by mouth daily. 04/27/15  Yes [name], MD  Ascorbic Acid (VITAMIN C) 500 MG tablet Take 500 mg by mouth 2 (two) times daily.     Yes Historical Provider, MD  aspirin 81 MG tablet Take 81 mg by mouth daily. 04/05/13  Yes [name], MD  atorvastatin (LIPITOR) 40 MG tablet take 1 tablet by mouth once daily 07/06/15  Yes [name], MD  B Complex Vitamins (B COMPLEX-B12 PO) Take by mouth daily.     Yes Historical Provider, MD  calcium-vitamin D (OSCAL WITH D) 500-200 MG-UNIT per tablet Take 1  tablet by mouth daily.     Yes Historical Provider, MD  carboxymethylcellulose (REFRESH PLUS) 0.5 % SOLN Place 2 drops into both eyes daily.   Yes Historical Provider, MD  Coenzyme Q10 200 MG capsule Take 200 mg by mouth daily.     Yes Historical Provider, MD  furosemide (LASIX) 40 MG tablet take 1 tablet by mouth twice a day Patient taking differently: take 1 tablet by mouth daily 08/04/15  Yes [name], MD  losartan (COZAAR) 50 MG tablet take 1 tablet by mouth once daily 07/06/15  Yes [name], MD  Multiple Vitamins-Minerals (CENTRUM SILVER ULTRA WOMENS) TABS Take by mouth.     Yes Historical Provider, MD  Omega-3 Fatty Acids (FISH OIL) 1200 MG CAPS Take 1 capsule by mouth daily.    Yes Historical Provider, MD  PROAIR HFA 108 (90 Base) MCG/ACT inhaler inhale 2 puffs INTO THE LUNGS every 4 hours if needed for wheezing or shortness of breath ( COUGH SHORTNESS OF BREATH AND WHEEZING) 01/07/16  Yes [name], MD  sodium chloride (OCEAN) 0.65 % nasal spray Place 1 spray into the nose daily.   Yes Historical Provider, MD  HYDROcodone-acetaminophen (NORCO/VICODIN) 5-325 MG tablet Take 1 tablet by mouth every 4 (four) hours as needed. 02/07/16   Dorie Rank, MD    Family History Family History  Problem Relation Age of Onset  . Heart disease Mother   . Heart disease Father   . Hypertension Brother   . Colon cancer Neg Hx   . Pancreatic cancer Neg Hx   . Rectal cancer Neg Hx   . Stomach cancer Neg Hx     Social History Social History  Substance Use Topics  . Smoking status: Never Smoker  . Smokeless tobacco: Never Used  . Alcohol use Yes     Comment: occ     Allergies   Augmentin [amoxicillin-pot clavulanate]   Review of Systems Review of Systems  All other systems reviewed and are negative.    Physical Exam Updated Vital Signs BP 134/72   Pulse 70   Temp 97.6 F (36.4 C) (Oral)   Resp 16   LMP  (LMP Unknown)   SpO2 94%   Physical Exam    Constitutional: She appears well-developed and well-nourished. No distress.  HENT:  Head: Normocephalic and atraumatic.  Right Ear: External ear normal.  Left Ear: External ear normal.  Eyes: Conjunctivae are normal. Right eye exhibits no discharge. Left eye exhibits no discharge. No scleral icterus.  Neck: Neck supple. No tracheal deviation present.  Cardiovascular: Normal rate.   Pulmonary/Chest: Effort normal. No stridor. No respiratory distress.  Abdominal: She exhibits no distension.  Musculoskeletal: She exhibits tenderness and deformity. She exhibits no edema.       Left knee: She exhibits normal range of motion, no swelling and no effusion.       Left ankle: She exhibits decreased range of motion and swelling. Tenderness. Lateral malleolus tenderness found. No head of 5th metatarsal and no proximal fibula tenderness found.       Left foot: There is no tenderness.  Neurological: She is alert. Cranial nerve deficit: no gross deficits.  Skin: Skin is warm and dry. No rash noted.  Psychiatric: She has a normal mood and affect.  Nursing note and vitals reviewed.    ED Treatments / Results  Labs (all labs ordered are listed, but only abnormal results are displayed) Labs Reviewed - No data to display  EKG  EKG Interpretation None       Radiology Dg Ankle Complete Left  Result Date: 02/07/2016 CLINICAL DATA:  Slipped and fell on ice.  Ankle pain. EXAM: LEFT ANKLE COMPLETE - 3+ VIEW COMPARISON:  07/18/2013 foot radiographs FINDINGS: Acute, closed, bimalleolar fracture-subluxation of the ankle joint is noted with slight medial subluxation of the tibial plafond relative to the talar dome resulting in slight widening of the medial femorotibial compartment up to 6 mm. There is 1/2 shaft width lateral displacement of the distal fibular fracture fragment. No posterior malleolar fracture. Osteoarthritic joint space narrowing of the midfoot articulations. Tiny plantar calcaneal  enthesophyte. IMPRESSION: Acute, closed, bimalleolar fracture-subluxation of the left ankle joint with slight medial subluxation of the tibial plafond relative to the talar dome up to 6 mm. Electronically Signed   By: Ashley Royalty M.D.   On: 02/07/2016 21:15    Procedures Procedures (including critical care time)  Medications Ordered in ED Medications  HYDROcodone-acetaminophen (NORCO/VICODIN) 5-325 MG per tablet 1 tablet (not administered)  ondansetron (ZOFRAN) injection 4 mg (4 mg Intravenous Given 02/07/16 2024)     Initial Impression / Assessment and Plan / ED Course  I have reviewed the triage vital signs and the nursing notes.  Pertinent labs & imaging results that were available during my care of the patient were reviewed by me and considered in my medical decision making (see chart for details).    Patient's x-rays show a bimalleolar fracture associated with medial subluxation.  I will place the patient in a splint and provided a walker. I will consult with orthopedics to arrange for follow-up.  D/w Dr. Wynelle Link.  Will see the patient in the office in follow up.  Final Clinical Impressions(s) / ED Diagnoses   Final diagnoses:  Ankle fracture, bimalleolar, closed, left, initial encounter    New Prescriptions New Prescriptions   HYDROCODONE-ACETAMINOPHEN (NORCO/VICODIN) 5-325 MG TABLET    Take 1 tablet by mouth every 4 (four) hours as needed.     Dorie Rank, MD 02/07/16 2258

## 2016-02-07 NOTE — ED Notes (Signed)
Bed: KD32 Expected date:  Expected time:  Means of arrival:  Comments: 81 yo F/ Fall left ankle

## 2016-02-10 ENCOUNTER — Other Ambulatory Visit: Payer: Self-pay | Admitting: Orthopedic Surgery

## 2016-02-17 ENCOUNTER — Encounter (HOSPITAL_COMMUNITY)
Admission: RE | Admit: 2016-02-17 | Discharge: 2016-02-17 | Disposition: A | Discharge status: Home / Self Care | Payer: Medicare Other | Source: Ambulatory Visit | Attending: Orthopedic Surgery | Admitting: Orthopedic Surgery

## 2016-02-17 ENCOUNTER — Other Ambulatory Visit: Payer: Self-pay | Admitting: Orthopedic Surgery

## 2016-02-17 ENCOUNTER — Encounter (HOSPITAL_COMMUNITY): Payer: Self-pay

## 2016-02-17 HISTORY — DX: Pneumonia, unspecified organism: J18.9

## 2016-02-17 HISTORY — DX: Personal history of urinary calculi: Z87.442

## 2016-02-17 LAB — BASIC METABOLIC PANEL
ANION GAP: 13 (ref 5–15)
BUN: 23 mg/dL — ABNORMAL HIGH (ref 6–20)
CALCIUM: 9.7 mg/dL (ref 8.9–10.3)
CO2: 26 mmol/L (ref 22–32)
CREATININE: 1.18 mg/dL — AB (ref 0.44–1.00)
Chloride: 101 mmol/L (ref 101–111)
GFR, EST AFRICAN AMERICAN: 49 mL/min — AB (ref >60)
GFR, EST NON AFRICAN AMERICAN: 43 mL/min — AB (ref >60)
GLUCOSE: 124 mg/dL — AB (ref 65–99)
Potassium: 4.2 mmol/L (ref 3.5–5.1)
Sodium: 140 mmol/L (ref 135–145)

## 2016-02-17 LAB — CBC
HCT: 40.5 % (ref 36.0–46.0)
Hemoglobin: 12.9 g/dL (ref 12.0–15.0)
MCH: 28.4 pg (ref 26.0–34.0)
MCHC: 31.9 g/dL (ref 30.0–36.0)
MCV: 89.2 fL (ref 78.0–100.0)
PLATELETS: 329 10*3/uL (ref 150–400)
RBC: 4.54 MIL/uL (ref 3.87–5.11)
RDW: 13.3 % (ref 11.5–15.5)
WBC: 8 10*3/uL (ref 4.0–10.5)

## 2016-02-17 NOTE — Pre-Procedure Instructions (Addendum)
Brenda Matthews  02/17/2016      [id number] - [place], [place] [address] Phone: [phone] Fax: [phone]    Your procedure is scheduled on 02/18/16.  Report to [facility] Admitting at 1:00 P.M.  Call this number if you have problems the morning of surgery:  [phone]   Remember:  Do not eat food or drink liquids after midnight.   Take these medicines the morning of surgery with A SIP OF WATER       Xanax if needed, amlodipine(norvasc), inhaler if needed (bring)  STOP all herbel meds, nsaids (aleve,naproxen,advil,ibuprofen) now including all vitamins/supplements, aspirin    Do not wear jewelry, make-up or nail polish.  Do not wear lotions, powders, or perfumes, or deoderant.  Do not shave 48 hours prior to surgery.  Men may shave face and neck.  Do not bring valuables to the hospital.  [facility] is not responsible for any belongings or valuables.  Contacts, dentures or bridgework may not be worn into surgery.  Leave your suitcase in the car.  After surgery it may be brought to your room.  For patients admitted to the hospital, discharge time will be determined by your treatment team.  Patients discharged the day of surgery will not be allowed to drive home.   Special instructions:   Special Instructions: [facility] - Preparing for Surgery  Before surgery, you can play an important role.  Because skin is not sterile, your skin needs to be as free of germs as possible.  You can reduce the number of germs on you skin by washing with CHG (chlorahexidine gluconate) soap before surgery.  CHG is an antiseptic cleaner which kills germs and bonds with the skin to continue killing germs even after washing.  Please DO NOT use if you have an allergy to CHG or antibacterial soaps.  If your skin becomes reddened/irritated stop using the CHG and inform your nurse when you arrive at Short  Stay.  Do not shave (including legs and underarms) for at least 48 hours prior to the first CHG shower.  You may shave your face.  Please follow these instructions carefully:   1.  Shower with CHG Soap the night before surgery and the morning of Surgery.  2.  If you choose to wash your hair, wash your hair first as usual with your normal shampoo.  3.  After you shampoo, rinse your hair and body thoroughly to remove the Shampoo.  4.  Use CHG as you would any other liquid soap.  You can apply chg directly  to the skin and wash gently with scrungie or a clean washcloth.  5.  Apply the CHG Soap to your body ONLY FROM THE NECK DOWN.  Do not use on open wounds or open sores.  Avoid contact with your eyes ears, mouth and genitals (private parts).  Wash genitals (private parts)       with your normal soap.  6.  Wash thoroughly, paying special attention to the area where your surgery will be performed.  7.  Thoroughly rinse your body with warm water from the neck down.  8.  DO NOT shower/wash with your normal soap after using and rinsing off the CHG Soap.  9.  Pat yourself dry with a clean towel.            10.  Wear clean pajamas.  11.  Place clean sheets on your bed the night of your first shower and do not sleep with pets.  Day of Surgery  Do not apply any lotions/deodorants the morning of surgery.  Please wear clean clothes to the hospital/surgery center.  Please read over the fact sheets that you were given.

## 2016-02-18 ENCOUNTER — Encounter (HOSPITAL_COMMUNITY): Payer: Self-pay | Admitting: Surgery

## 2016-02-18 ENCOUNTER — Encounter (HOSPITAL_COMMUNITY)
Admission: RE | Disposition: A | Discharge status: Skilled Nursing Facility | Payer: Self-pay | Source: Ambulatory Visit | Attending: Orthopedic Surgery

## 2016-02-18 ENCOUNTER — Inpatient Hospital Stay (HOSPITAL_COMMUNITY): Payer: Medicare Other | Admitting: Anesthesiology

## 2016-02-18 ENCOUNTER — Inpatient Hospital Stay (HOSPITAL_COMMUNITY)
Admission: RE | Admit: 2016-02-18 | Discharge: 2016-02-20 | DRG: 493 | Disposition: A | Discharge status: Skilled Nursing Facility | Payer: Medicare Other | Source: Ambulatory Visit | Attending: Orthopedic Surgery | Admitting: Orthopedic Surgery

## 2016-02-18 ENCOUNTER — Inpatient Hospital Stay (HOSPITAL_COMMUNITY): Payer: Medicare Other | Admitting: Emergency Medicine

## 2016-02-18 DIAGNOSIS — N179 Acute kidney failure, unspecified: Secondary | ICD-10-CM | POA: Diagnosis present

## 2016-02-18 DIAGNOSIS — Z79899 Other long term (current) drug therapy: Secondary | ICD-10-CM | POA: Diagnosis not present

## 2016-02-18 DIAGNOSIS — Y929 Unspecified place or not applicable: Secondary | ICD-10-CM | POA: Diagnosis not present

## 2016-02-18 DIAGNOSIS — F411 Generalized anxiety disorder: Secondary | ICD-10-CM | POA: Diagnosis present

## 2016-02-18 DIAGNOSIS — S82852A Displaced trimalleolar fracture of left lower leg, initial encounter for closed fracture: Secondary | ICD-10-CM | POA: Diagnosis present

## 2016-02-18 DIAGNOSIS — R000 Tachycardia, unspecified: Secondary | ICD-10-CM

## 2016-02-18 DIAGNOSIS — Z880 Allergy status to penicillin: Secondary | ICD-10-CM

## 2016-02-18 DIAGNOSIS — Z7983 Long term (current) use of bisphosphonates: Secondary | ICD-10-CM

## 2016-02-18 DIAGNOSIS — I480 Paroxysmal atrial fibrillation: Secondary | ICD-10-CM | POA: Diagnosis not present

## 2016-02-18 DIAGNOSIS — Z961 Presence of intraocular lens: Secondary | ICD-10-CM | POA: Diagnosis present

## 2016-02-18 DIAGNOSIS — S82892A Other fracture of left lower leg, initial encounter for closed fracture: Secondary | ICD-10-CM | POA: Diagnosis present

## 2016-02-18 DIAGNOSIS — Z8781 Personal history of (healed) traumatic fracture: Secondary | ICD-10-CM | POA: Diagnosis not present

## 2016-02-18 DIAGNOSIS — E0590 Thyrotoxicosis, unspecified without thyrotoxic crisis or storm: Secondary | ICD-10-CM | POA: Diagnosis present

## 2016-02-18 DIAGNOSIS — Z79891 Long term (current) use of opiate analgesic: Secondary | ICD-10-CM

## 2016-02-18 DIAGNOSIS — R609 Edema, unspecified: Secondary | ICD-10-CM | POA: Diagnosis present

## 2016-02-18 DIAGNOSIS — R0902 Hypoxemia: Secondary | ICD-10-CM | POA: Diagnosis present

## 2016-02-18 DIAGNOSIS — Z8249 Family history of ischemic heart disease and other diseases of the circulatory system: Secondary | ICD-10-CM

## 2016-02-18 DIAGNOSIS — I472 Ventricular tachycardia: Secondary | ICD-10-CM | POA: Diagnosis present

## 2016-02-18 DIAGNOSIS — I129 Hypertensive chronic kidney disease with stage 1 through stage 4 chronic kidney disease, or unspecified chronic kidney disease: Secondary | ICD-10-CM | POA: Diagnosis present

## 2016-02-18 DIAGNOSIS — E785 Hyperlipidemia, unspecified: Secondary | ICD-10-CM | POA: Diagnosis present

## 2016-02-18 DIAGNOSIS — M8588 Other specified disorders of bone density and structure, other site: Secondary | ICD-10-CM | POA: Diagnosis present

## 2016-02-18 DIAGNOSIS — Z9842 Cataract extraction status, left eye: Secondary | ICD-10-CM

## 2016-02-18 DIAGNOSIS — Z7982 Long term (current) use of aspirin: Secondary | ICD-10-CM

## 2016-02-18 DIAGNOSIS — R7989 Other specified abnormal findings of blood chemistry: Secondary | ICD-10-CM

## 2016-02-18 DIAGNOSIS — Z87442 Personal history of urinary calculi: Secondary | ICD-10-CM | POA: Diagnosis not present

## 2016-02-18 DIAGNOSIS — Z8619 Personal history of other infectious and parasitic diseases: Secondary | ICD-10-CM | POA: Diagnosis not present

## 2016-02-18 DIAGNOSIS — N182 Chronic kidney disease, stage 2 (mild): Secondary | ICD-10-CM | POA: Diagnosis present

## 2016-02-18 DIAGNOSIS — I10 Essential (primary) hypertension: Secondary | ICD-10-CM | POA: Diagnosis present

## 2016-02-18 DIAGNOSIS — R946 Abnormal results of thyroid function studies: Secondary | ICD-10-CM | POA: Diagnosis not present

## 2016-02-18 DIAGNOSIS — Z9841 Cataract extraction status, right eye: Secondary | ICD-10-CM

## 2016-02-18 DIAGNOSIS — Z86010 Personal history of colonic polyps: Secondary | ICD-10-CM

## 2016-02-18 DIAGNOSIS — E782 Mixed hyperlipidemia: Secondary | ICD-10-CM | POA: Diagnosis not present

## 2016-02-18 DIAGNOSIS — I4891 Unspecified atrial fibrillation: Secondary | ICD-10-CM | POA: Diagnosis not present

## 2016-02-18 HISTORY — DX: Paroxysmal atrial fibrillation: I48.0

## 2016-02-18 HISTORY — DX: Other specified abnormal findings of blood chemistry: R79.89

## 2016-02-18 HISTORY — PX: OPEN REDUCTION INTERNAL FIXATION (ORIF) TIBIA/FIBULA FRACTURE: SHX5992

## 2016-02-18 SURGERY — OPEN REDUCTION INTERNAL FIXATION (ORIF) TIBIA/FIBULA FRACTURE
Anesthesia: Regional | Site: Ankle | Laterality: Left

## 2016-02-18 MED ORDER — POLYETHYLENE GLYCOL 3350 17 G PO PACK: 17.0000 g | PACK | Freq: Every day | ORAL | Status: DC | PRN

## 2016-02-18 MED ORDER — ALBUTEROL SULFATE (2.5 MG/3ML) 0.083% IN NEBU: 2.5000 mg | INHALATION_SOLUTION | RESPIRATORY_TRACT | Status: DC | PRN

## 2016-02-18 MED ORDER — PHENYLEPHRINE 40 MCG/ML (10ML) SYRINGE FOR IV PUSH (FOR BLOOD PRESSURE SUPPORT)
PREFILLED_SYRINGE | INTRAVENOUS | Status: DC | PRN
  Administered 2016-02-18 (×3): 80 ug via INTRAVENOUS
  Administered 2016-02-18: 40 ug via INTRAVENOUS

## 2016-02-18 MED ORDER — CALCIUM CARBONATE-VITAMIN D 500-200 MG-UNIT PO TABS
1.0000 | ORAL_TABLET | Freq: Every evening | ORAL | Status: DC
  Administered 2016-02-19: 17:00:00 1 via ORAL
  Filled 2016-02-18: qty 1

## 2016-02-18 MED ORDER — ACETAMINOPHEN 325 MG PO TABS: 650.0000 mg | ORAL_TABLET | Freq: Four times a day (QID) | ORAL | Status: DC | PRN

## 2016-02-18 MED ORDER — CELECOXIB 200 MG PO CAPS
200.0000 mg | ORAL_CAPSULE | Freq: Two times a day (BID) | ORAL | Status: DC
  Administered 2016-02-18 – 2016-02-20 (×4): 200 mg via ORAL
  Filled 2016-02-18 (×4): qty 1

## 2016-02-18 MED ORDER — METHOCARBAMOL 500 MG PO TABS
500.0000 mg | ORAL_TABLET | Freq: Four times a day (QID) | ORAL | Status: DC | PRN
  Administered 2016-02-20 (×2): 500 mg via ORAL
  Filled 2016-02-18 (×2): qty 1

## 2016-02-18 MED ORDER — ONDANSETRON HCL 4 MG/2ML IJ SOLN
INTRAMUSCULAR | Status: DC | PRN
  Administered 2016-02-18: 4 mg via INTRAVENOUS

## 2016-02-18 MED ORDER — ESMOLOL HCL 100 MG/10ML IV SOLN
INTRAVENOUS | Status: AC
  Filled 2016-02-18: qty 10

## 2016-02-18 MED ORDER — ALBUTEROL SULFATE HFA 108 (90 BASE) MCG/ACT IN AERS: 2.0000 | INHALATION_SPRAY | RESPIRATORY_TRACT | Status: DC | PRN

## 2016-02-18 MED ORDER — POLYVINYL ALCOHOL 1.4 % OP SOLN
2.0000 [drp] | Freq: Every day | OPHTHALMIC | Status: DC
  Administered 2016-02-19: 2 [drp] via OPHTHALMIC
  Filled 2016-02-18: qty 15

## 2016-02-18 MED ORDER — PROPOFOL 10 MG/ML IV BOLUS
INTRAVENOUS | Status: DC | PRN
  Administered 2016-02-18: 150 mg via INTRAVENOUS

## 2016-02-18 MED ORDER — ENOXAPARIN SODIUM 40 MG/0.4ML ~~LOC~~ SOLN: 40.0000 mg | SUBCUTANEOUS | Status: DC

## 2016-02-18 MED ORDER — CENTRUM SILVER ULTRA WOMENS PO TABS: 1.0000 | ORAL_TABLET | Freq: Every evening | ORAL | Status: DC

## 2016-02-18 MED ORDER — ACETAMINOPHEN 650 MG RE SUPP: 650.0000 mg | Freq: Four times a day (QID) | RECTAL | Status: DC | PRN

## 2016-02-18 MED ORDER — ATORVASTATIN CALCIUM 40 MG PO TABS
40.0000 mg | ORAL_TABLET | Freq: Every evening | ORAL | Status: DC
  Administered 2016-02-19: 40 mg via ORAL
  Filled 2016-02-18: qty 1

## 2016-02-18 MED ORDER — LIDOCAINE 2% (20 MG/ML) 5 ML SYRINGE
INTRAMUSCULAR | Status: DC | PRN
  Administered 2016-02-18: 100 mg via INTRAVENOUS

## 2016-02-18 MED ORDER — DEXTROSE 5 % IV SOLN
5.0000 mg/h | INTRAVENOUS | Status: DC
  Administered 2016-02-18: 15 mg/h via INTRAVENOUS
  Filled 2016-02-18 (×3): qty 100

## 2016-02-18 MED ORDER — MORPHINE SULFATE (PF) 2 MG/ML IV SOLN: 2.0000 mg | INTRAVENOUS | Status: DC | PRN

## 2016-02-18 MED ORDER — OMEGA-3-ACID ETHYL ESTERS 1 G PO CAPS
1.0000 g | ORAL_CAPSULE | Freq: Every day | ORAL | Status: DC
  Administered 2016-02-19 – 2016-02-20 (×2): 1 g via ORAL
  Filled 2016-02-18 (×2): qty 1

## 2016-02-18 MED ORDER — ONDANSETRON HCL 4 MG/2ML IJ SOLN
INTRAMUSCULAR | Status: AC
  Filled 2016-02-18: qty 4

## 2016-02-18 MED ORDER — MEPERIDINE HCL 25 MG/ML IJ SOLN: 6.2500 mg | INTRAMUSCULAR | Status: DC | PRN

## 2016-02-18 MED ORDER — ADULT MULTIVITAMIN W/MINERALS CH
1.0000 | ORAL_TABLET | Freq: Every day | ORAL | Status: DC
  Administered 2016-02-19 – 2016-02-20 (×2): 1 via ORAL
  Filled 2016-02-18 (×2): qty 1

## 2016-02-18 MED ORDER — VITAMIN B-12 1000 MCG PO TABS
1000.0000 ug | ORAL_TABLET | Freq: Every day | ORAL | Status: DC
  Administered 2016-02-19 – 2016-02-20 (×2): 1000 ug via ORAL
  Filled 2016-02-18 (×2): qty 1

## 2016-02-18 MED ORDER — SALINE SPRAY 0.65 % NA SOLN
1.0000 | NASAL | Status: DC | PRN
  Administered 2016-02-18: 1 via NASAL
  Filled 2016-02-18: qty 44

## 2016-02-18 MED ORDER — SENNA 8.6 MG PO TABS
1.0000 | ORAL_TABLET | Freq: Two times a day (BID) | ORAL | Status: DC
  Administered 2016-02-18 – 2016-02-20 (×4): 8.6 mg via ORAL
  Filled 2016-02-18 (×4): qty 1

## 2016-02-18 MED ORDER — ACETAMINOPHEN 500 MG PO TABS
1000.0000 mg | ORAL_TABLET | Freq: Four times a day (QID) | ORAL | Status: AC
  Administered 2016-02-18 – 2016-02-19 (×4): 1000 mg via ORAL
  Filled 2016-02-18 (×4): qty 2

## 2016-02-18 MED ORDER — CEFAZOLIN SODIUM-DEXTROSE 2-4 GM/100ML-% IV SOLN
2.0000 g | INTRAVENOUS | Status: AC
  Administered 2016-02-18: 2 g via INTRAVENOUS
  Filled 2016-02-18: qty 100

## 2016-02-18 MED ORDER — SODIUM CHLORIDE 0.9 % IV SOLN: INTRAVENOUS | Status: DC

## 2016-02-18 MED ORDER — ESMOLOL HCL 100 MG/10ML IV SOLN
INTRAVENOUS | Status: DC | PRN
  Administered 2016-02-18: 30 mg via INTRAVENOUS

## 2016-02-18 MED ORDER — PROPOFOL 10 MG/ML IV BOLUS
INTRAVENOUS | Status: AC
  Filled 2016-02-18: qty 20

## 2016-02-18 MED ORDER — METOPROLOL TARTRATE 25 MG PO TABS
25.0000 mg | ORAL_TABLET | Freq: Two times a day (BID) | ORAL | Status: DC
  Administered 2016-02-18 – 2016-02-20 (×4): 25 mg via ORAL
  Filled 2016-02-18 (×4): qty 1

## 2016-02-18 MED ORDER — DILTIAZEM HCL 100 MG IV SOLR
5.0000 mg/h | INTRAVENOUS | Status: DC
  Administered 2016-02-18: 5 mg/h via INTRAVENOUS
  Administered 2016-02-18: 12.5 mg/h via INTRAVENOUS
  Administered 2016-02-18: 10 mg/h via INTRAVENOUS
  Filled 2016-02-18: qty 100

## 2016-02-18 MED ORDER — VITAMIN C 500 MG PO TABS
1000.0000 mg | ORAL_TABLET | Freq: Every evening | ORAL | Status: DC
  Administered 2016-02-19: 1000 mg via ORAL
  Filled 2016-02-18: qty 2

## 2016-02-18 MED ORDER — HYDROMORPHONE HCL 1 MG/ML IJ SOLN: 0.2500 mg | INTRAMUSCULAR | Status: DC | PRN

## 2016-02-18 MED ORDER — BUPIVACAINE-EPINEPHRINE (PF) 0.5% -1:200000 IJ SOLN
INTRAMUSCULAR | Status: DC | PRN
  Administered 2016-02-18: 50 mL

## 2016-02-18 MED ORDER — B COMPLEX-B12 PO TABS: 1.0000 | ORAL_TABLET | Freq: Every evening | ORAL | Status: DC

## 2016-02-18 MED ORDER — LIDOCAINE 2% (20 MG/ML) 5 ML SYRINGE
INTRAMUSCULAR | Status: AC
  Filled 2016-02-18: qty 5

## 2016-02-18 MED ORDER — OXYCODONE HCL 5 MG PO TABS
5.0000 mg | ORAL_TABLET | ORAL | Status: DC | PRN
  Administered 2016-02-19 – 2016-02-20 (×4): 10 mg via ORAL
  Filled 2016-02-18 (×4): qty 2

## 2016-02-18 MED ORDER — ACETAMINOPHEN 500 MG PO TABS: 1000.0000 mg | ORAL_TABLET | Freq: Four times a day (QID) | ORAL | Status: DC

## 2016-02-18 MED ORDER — LACTATED RINGERS IV SOLN
INTRAVENOUS | Status: DC | PRN
  Administered 2016-02-18 (×2): via INTRAVENOUS

## 2016-02-18 MED ORDER — 0.9 % SODIUM CHLORIDE (POUR BTL) OPTIME
TOPICAL | Status: DC | PRN
  Administered 2016-02-18: 1000 mL

## 2016-02-18 MED ORDER — ROCURONIUM BROMIDE 50 MG/5ML IV SOSY
PREFILLED_SYRINGE | INTRAVENOUS | Status: AC
  Filled 2016-02-18: qty 5

## 2016-02-18 MED ORDER — CHLORHEXIDINE GLUCONATE 4 % EX LIQD
60.0000 mL | Freq: Once | CUTANEOUS | Status: DC
  Administered 2016-02-18: 4 via TOPICAL

## 2016-02-18 MED ORDER — FENTANYL CITRATE (PF) 100 MCG/2ML IJ SOLN
INTRAMUSCULAR | Status: AC
  Filled 2016-02-18: qty 4

## 2016-02-18 MED ORDER — SALINE NASAL SPRAY 0.65 % NA SOLN: 1.0000 | NASAL | Status: DC | PRN

## 2016-02-18 MED ORDER — KCL IN DEXTROSE-NACL 20-5-0.45 MEQ/L-%-% IV SOLN
INTRAVENOUS | Status: DC
  Administered 2016-02-18: 23:00:00 via INTRAVENOUS
  Filled 2016-02-18: qty 1000

## 2016-02-18 MED ORDER — BISACODYL 10 MG RE SUPP: 10.0000 mg | Freq: Every day | RECTAL | Status: DC | PRN

## 2016-02-18 MED ORDER — ACETAMINOPHEN 325 MG PO TABS
650.0000 mg | ORAL_TABLET | Freq: Four times a day (QID) | ORAL | Status: DC | PRN
  Administered 2016-02-20: 650 mg via ORAL
  Filled 2016-02-18: qty 2

## 2016-02-18 MED ORDER — COENZYME Q10 200 MG PO CAPS: 200.0000 mg | ORAL_CAPSULE | Freq: Every evening | ORAL | Status: DC

## 2016-02-18 MED ORDER — MAGNESIUM CITRATE PO SOLN
1.0000 | Freq: Once | ORAL | Status: DC | PRN
  Filled 2016-02-18: qty 296

## 2016-02-18 MED ORDER — ACETAMINOPHEN 650 MG RE SUPP
650.0000 mg | Freq: Four times a day (QID) | RECTAL | Status: DC | PRN
Start: 2016-02-19 — End: 2016-02-20

## 2016-02-18 MED ORDER — FENTANYL CITRATE (PF) 100 MCG/2ML IJ SOLN
INTRAMUSCULAR | Status: DC | PRN
  Administered 2016-02-18: 50 ug via INTRAVENOUS

## 2016-02-18 MED ORDER — LIDOCAINE HCL (CARDIAC) 20 MG/ML IV SOLN: INTRAVENOUS | Status: DC | PRN

## 2016-02-18 MED ORDER — PHENYLEPHRINE 40 MCG/ML (10ML) SYRINGE FOR IV PUSH (FOR BLOOD PRESSURE SUPPORT)
PREFILLED_SYRINGE | INTRAVENOUS | Status: AC
  Filled 2016-02-18: qty 10

## 2016-02-18 MED ORDER — ALPRAZOLAM 0.5 MG PO TABS
0.5000 mg | ORAL_TABLET | Freq: Three times a day (TID) | ORAL | Status: DC | PRN
  Administered 2016-02-18 – 2016-02-19 (×2): 0.5 mg via ORAL
  Filled 2016-02-18 (×2): qty 1

## 2016-02-18 MED ORDER — DOCUSATE SODIUM 100 MG PO CAPS
100.0000 mg | ORAL_CAPSULE | Freq: Two times a day (BID) | ORAL | Status: DC
  Administered 2016-02-18 – 2016-02-20 (×4): 100 mg via ORAL
  Filled 2016-02-18 (×4): qty 1

## 2016-02-18 MED ORDER — FUROSEMIDE 40 MG PO TABS
40.0000 mg | ORAL_TABLET | Freq: Every day | ORAL | Status: DC
  Administered 2016-02-18 – 2016-02-20 (×3): 40 mg via ORAL
  Filled 2016-02-18 (×3): qty 1

## 2016-02-18 MED ORDER — AMLODIPINE BESYLATE 10 MG PO TABS
10.0000 mg | ORAL_TABLET | Freq: Every day | ORAL | Status: DC
  Administered 2016-02-19: 10 mg via ORAL
  Filled 2016-02-18: qty 1
  Filled 2016-02-18: qty 2

## 2016-02-18 MED ORDER — OXYCODONE HCL 5 MG PO TABS: 5.0000 mg | ORAL_TABLET | Freq: Once | ORAL | Status: DC | PRN

## 2016-02-18 MED ORDER — ASPIRIN EC 81 MG PO TBEC
81.0000 mg | DELAYED_RELEASE_TABLET | Freq: Two times a day (BID) | ORAL | Status: DC
  Administered 2016-02-18 – 2016-02-20 (×4): 81 mg via ORAL
  Filled 2016-02-18 (×4): qty 1

## 2016-02-18 MED ORDER — ONDANSETRON HCL 4 MG PO TABS: 4.0000 mg | ORAL_TABLET | Freq: Four times a day (QID) | ORAL | Status: DC | PRN

## 2016-02-18 MED ORDER — MIDAZOLAM HCL 5 MG/5ML IJ SOLN
INTRAMUSCULAR | Status: DC | PRN
  Administered 2016-02-18: 1 mg via INTRAVENOUS

## 2016-02-18 MED ORDER — PROMETHAZINE HCL 25 MG/ML IJ SOLN: 6.2500 mg | INTRAMUSCULAR | Status: DC | PRN

## 2016-02-18 MED ORDER — OXYCODONE HCL 5 MG/5ML PO SOLN: 5.0000 mg | Freq: Once | ORAL | Status: DC | PRN

## 2016-02-18 MED ORDER — PHENYLEPHRINE HCL 10 MG/ML IJ SOLN
INTRAMUSCULAR | Status: AC
  Filled 2016-02-18: qty 1

## 2016-02-18 MED ORDER — LACTATED RINGERS IV SOLN
INTRAVENOUS | Status: DC
  Administered 2016-02-18: 13:00:00 via INTRAVENOUS

## 2016-02-18 MED ORDER — ONDANSETRON HCL 4 MG/2ML IJ SOLN: 4.0000 mg | Freq: Four times a day (QID) | INTRAMUSCULAR | Status: DC | PRN

## 2016-02-18 MED ORDER — CARBOXYMETHYLCELLULOSE SODIUM 0.5 % OP SOLN
2.0000 [drp] | Freq: Every day | OPHTHALMIC | Status: DC
Start: 2016-02-18 — End: 2016-02-18

## 2016-02-18 MED ORDER — HYPROMELLOSE (GONIOSCOPIC) 2.5 % OP SOLN
2.0000 [drp] | Freq: Every day | OPHTHALMIC | Status: DC
Start: 2016-02-18 — End: 2016-02-18
  Filled 2016-02-18: qty 15

## 2016-02-18 MED ORDER — LOSARTAN POTASSIUM 50 MG PO TABS
50.0000 mg | ORAL_TABLET | Freq: Every day | ORAL | Status: DC
  Administered 2016-02-19 – 2016-02-20 (×2): 50 mg via ORAL
  Filled 2016-02-18 (×2): qty 1

## 2016-02-18 MED ORDER — METHOCARBAMOL 1000 MG/10ML IJ SOLN
500.0000 mg | Freq: Four times a day (QID) | INTRAVENOUS | Status: DC | PRN
  Filled 2016-02-18: qty 5

## 2016-02-18 MED ORDER — PHENYLEPHRINE HCL 10 MG/ML IJ SOLN
INTRAVENOUS | Status: DC | PRN
  Administered 2016-02-18: 20 ug/min via INTRAVENOUS

## 2016-02-18 MED ORDER — MIDAZOLAM HCL 2 MG/2ML IJ SOLN
INTRAMUSCULAR | Status: AC
  Filled 2016-02-18: qty 2

## 2016-02-18 SURGICAL SUPPLY — 76 items
BANDAGE ESMARK 6X9 LF (GAUZE/BANDAGES/DRESSINGS) ×1 IMPLANT
BIT DRILL 2.5X2.75 QC CALB (BIT) ×2 IMPLANT
BLADE SURG 15 STRL LF DISP TIS (BLADE) ×1 IMPLANT
BLADE SURG 15 STRL SS (BLADE) ×3
BNDG CMPR 9X6 STRL LF SNTH (GAUZE/BANDAGES/DRESSINGS) ×1
BNDG COHESIVE 4X5 TAN STRL (GAUZE/BANDAGES/DRESSINGS) ×3 IMPLANT
BNDG COHESIVE 6X5 TAN STRL LF (GAUZE/BANDAGES/DRESSINGS) ×3 IMPLANT
BNDG ESMARK 6X9 LF (GAUZE/BANDAGES/DRESSINGS) ×3
CANISTER SUCT 3000ML PPV (MISCELLANEOUS) ×3 IMPLANT
CHLORAPREP W/TINT 26ML (MISCELLANEOUS) ×5 IMPLANT
COVER SURGICAL LIGHT HANDLE (MISCELLANEOUS) ×3 IMPLANT
CUFF TOURNIQUET SINGLE 34IN LL (TOURNIQUET CUFF) ×3 IMPLANT
CUFF TOURNIQUET SINGLE 44IN (TOURNIQUET CUFF) IMPLANT
DRAPE C-ARMOR (DRAPES) IMPLANT
DRAPE INCISE IOBAN 66X45 STRL (DRAPES) ×1 IMPLANT
DRAPE OEC MINIVIEW 54X84 (DRAPES) ×3 IMPLANT
DRAPE ORTHO SPLIT 77X108 STRL (DRAPES) ×3
DRAPE SURG ORHT 6 SPLT 77X108 (DRAPES) IMPLANT
DRAPE U-SHAPE 47X51 STRL (DRAPES) ×3 IMPLANT
DRSG ADAPTIC 3X8 NADH LF (GAUZE/BANDAGES/DRESSINGS) IMPLANT
DRSG MEPILEX BORDER 4X4 (GAUZE/BANDAGES/DRESSINGS) ×1 IMPLANT
DRSG MEPITEL 4X7.2 (GAUZE/BANDAGES/DRESSINGS) ×3 IMPLANT
DRSG PAD ABDOMINAL 8X10 ST (GAUZE/BANDAGES/DRESSINGS) ×6 IMPLANT
ELECT REM PT RETURN 9FT ADLT (ELECTROSURGICAL) ×3
ELECTRODE REM PT RTRN 9FT ADLT (ELECTROSURGICAL) ×1 IMPLANT
GAUZE SPONGE 4X4 12PLY STRL (GAUZE/BANDAGES/DRESSINGS) ×2 IMPLANT
GLOVE BIO SURGEON STRL SZ8 (GLOVE) ×3 IMPLANT
GLOVE BIOGEL PI IND STRL 8 (GLOVE) ×2 IMPLANT
GLOVE BIOGEL PI INDICATOR 8 (GLOVE) ×4
GLOVE ECLIPSE 7.5 STRL STRAW (GLOVE) ×6 IMPLANT
GOWN STRL REUS W/ TWL LRG LVL3 (GOWN DISPOSABLE) ×1 IMPLANT
GOWN STRL REUS W/ TWL XL LVL3 (GOWN DISPOSABLE) ×2 IMPLANT
GOWN STRL REUS W/TWL LRG LVL3 (GOWN DISPOSABLE) ×3
GOWN STRL REUS W/TWL XL LVL3 (GOWN DISPOSABLE) ×6
K-WIRE ACE 1.6X6 (WIRE) ×6
KIT BASIN OR (CUSTOM PROCEDURE TRAY) ×3 IMPLANT
KIT ROOM TURNOVER OR (KITS) ×3 IMPLANT
KWIRE ACE 1.6X6 (WIRE) IMPLANT
MANIFOLD NEPTUNE II (INSTRUMENTS) ×1 IMPLANT
NEEDLE 22X1 1/2 (OR ONLY) (NEEDLE) IMPLANT
NS IRRIG 1000ML POUR BTL (IV SOLUTION) ×3 IMPLANT
PACK ORTHO EXTREMITY (CUSTOM PROCEDURE TRAY) ×3 IMPLANT
PAD ARMBOARD 7.5X6 YLW CONV (MISCELLANEOUS) ×3 IMPLANT
PAD CAST 4YDX4 CTTN HI CHSV (CAST SUPPLIES) ×1 IMPLANT
PADDING CAST COTTON 4X4 STRL (CAST SUPPLIES) ×3
PADDING CAST COTTON 6X4 STRL (CAST SUPPLIES) ×6 IMPLANT
PLATE ACE 100DEG 3HOLE (Plate) ×2 IMPLANT
PLATE LOCK 7H 92 BILAT FIB (Plate) ×3 IMPLANT
SCREW ACE CAN 4.0 40M (Screw) ×6 IMPLANT
SCREW CORTICAL 3.5MM  34MM (Screw) ×2 IMPLANT
SCREW CORTICAL 3.5MM 34MM (Screw) IMPLANT
SCREW CORTICAL 3.5MM 40MM (Screw) ×2 IMPLANT
SCREW LOCK 3.5X18 DIST TIB (Screw) ×2 IMPLANT
SCREW LOCK CORT STAR 3.5X10 (Screw) ×2 IMPLANT
SCREW LOCK CORT STAR 3.5X12 (Screw) ×3 IMPLANT
SCREW LOW PROFILE 18MMX3.5MM (Screw) ×2 IMPLANT
SCREW NON LOCKING LP 3.5 14MM (Screw) ×2 IMPLANT
SCREW NON LOCKING LP 3.5 16MM (Screw) ×4 IMPLANT
SPLINT PLASTER CAST XFAST 5X30 (CAST SUPPLIES) IMPLANT
SPLINT PLASTER XFAST SET 5X30 (CAST SUPPLIES) ×2
SPONGE LAP 18X18 X RAY DECT (DISPOSABLE) ×3 IMPLANT
STAPLER VISISTAT 35W (STAPLE) IMPLANT
SUCTION FRAZIER HANDLE 10FR (MISCELLANEOUS) ×2
SUCTION TUBE FRAZIER 10FR DISP (MISCELLANEOUS) ×1 IMPLANT
SUT ETHILON 2 0 FS 18 (SUTURE) IMPLANT
SUT ETHILON 3 0 PS 1 (SUTURE) ×3 IMPLANT
SUT MNCRL AB 3-0 PS2 18 (SUTURE) ×3 IMPLANT
SUT VIC AB 0 CT1 27 (SUTURE) ×3
SUT VIC AB 0 CT1 27XBRD ANBCTR (SUTURE) IMPLANT
SUT VIC AB 2-0 FS1 27 (SUTURE) ×3 IMPLANT
SYR CONTROL 10ML LL (SYRINGE) IMPLANT
TOWEL OR 17X24 6PK STRL BLUE (TOWEL DISPOSABLE) ×3 IMPLANT
TOWEL OR 17X26 10 PK STRL BLUE (TOWEL DISPOSABLE) ×3 IMPLANT
TUBE CONNECTING 12'X1/4 (SUCTIONS) ×1
TUBE CONNECTING 12X1/4 (SUCTIONS) ×2 IMPLANT
UNDERPAD 30X30 (UNDERPADS AND DIAPERS) ×1 IMPLANT

## 2016-02-18 NOTE — Consult Note (Signed)
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 She has no history of atrial fibrillation, tachycardia, or chest pain. Her hx does indicate a negative Nuclear stress in 2008 but I could find no documentation in EPIC. She tells me she had an irregular HR when she had her colonoscopy in the past.   Pre op EKG normal sinus. During induction she went into AF with RVR. She is asymptomatic. Her HR is 120- IV Diltiazem just started in PACU.   PMHx:  Past Medical History:  Diagnosis Date  . Abnormal EKG approx 2008   Nuclear stress test neg;   . Anxiety    with panic  . Arthritis   . Cataract    s/p surgery--lens implants  . Chronic renal insufficiency, stage II (mild) 12/04/2012   GFR 60s  . Diverticulosis 2009 colonoscopy  . Fracture of radial shaft, left, closed 11/16/10   fell down flight of stairs  . History of kidney stones   . Hx of adenomatous colonic polyps 2002   surveillance colonoscopy 2009, +polypectomy done-tubular adenoma w/out high grade.  05/2013 tubular adenomas--recall 3 yrs  . Hyperlipidemia   . Hypertension   . Osteopenia    DEXA 08/2010; repeat DEXA 02/2015 worse: fosamax started  . Peripheral edema   . Pneumonia 2015   hx  . Rheumatic fever     Past Surgical History:  Procedure Laterality Date  . APPENDECTOMY  1966   done during surgery for tubal pregnancy  . CATARACT EXTRACTION W/ INTRAOCULAR LENS IMPLANT  2013   bilat  . COLONOSCOPY W/ POLYPECTOMY  05/2013   +diverticulosis; recall 3 yrs (Dr. Henrene Pastor)  . DEXA  02/2015   T score -2.1 in both femoral necks; FRAX 10 yr risk of major osteoporotic fracture was 21%---fosamax started  . EYE SURGERY    . ORIF RADIAL FRACTURE   11/18/10   left; s/p slip on slippery floor and fell  . TONSILLECTOMY      SOCHx:  reports that she has never smoked. She has never used smokeless tobacco. She reports that she drinks alcohol. She reports that she does not use drugs.  FAMHx: Family History  Problem Relation Age of Onset  . Heart disease Mother   . Heart disease Father   . Hypertension Brother   . Colon cancer Neg Hx   . Pancreatic cancer Neg Hx   . Rectal cancer Neg Hx   . Stomach cancer Neg Hx     ALLERGIES: Allergies  Allergen Reactions  . Augmentin [Amoxicillin-Pot Clavulanate] Nausea And Vomiting    "projectile vomiting" Has patient had a PCN reaction causing immediate rash, facial/tongue/throat swelling, SOB or lightheadedness with hypotension:No Has patient had a PCN reaction causing severe rash involving mucus membranes or skin necrosis:No Has patient had a PCN reaction that required hospitalization:No Has patient had a PCN reaction occurring within the last 10 years:Yes If all of the above answers are "NO", then may proceed with Cephalosporin use.     ROS: Review of Systems: General: negative for chills, fever, night sweats or weight changes.  Cardiovascular: negative for chest pain, dyspnea on exertion, edema, orthopnea, palpitations, paroxysmal nocturnal dyspnea or shortness of breath  HEENT: negative for any visual disturbances, blindness, glaucoma Dermatological: negative for rash Respiratory: negative for cough, hemoptysis, or wheezing Urologic: negative for hematuria or dysuria Abdominal: negative for nausea, vomiting, diarrhea, bright red blood per rectum, melena, or hematemesis Neurologic: negative for visual changes, syncope, or dizziness Musculoskeletal: negative for back pain, joint pain, or swelling Psych: cooperative and appropriate All other systems reviewed and are otherwise negative except as noted above.   HOME MEDICATIONS: Prior to Admission medications   Medication Sig Start  Date End Date Taking? Authorizing Provider  alendronate (FOSAMAX) 70 MG tablet Take 1 tablet (70 mg total) by mouth every 7 (seven) days. Take with a full glass of water on an empty stomach. Patient taking differently: Take 70 mg by mouth every Monday. Take with a full glass of water on an empty stomach. 03/06/15  Yes [name], MD  ALPRAZolam Duanne Moron) 0.5 MG tablet take 1 tablet by mouth three times a day if needed for STRESS 12/07/15  Yes [name], MD  amLODipine (NORVASC) 10 MG tablet Take 1 tablet (10 mg total) by mouth daily. Patient taking differently: Take 10 mg by mouth every evening.  04/27/15  Yes [name], MD  Ascorbic Acid (VITAMIN C) 500 MG tablet Take 1,000 mg by mouth every evening.    Yes Historical Provider, MD  aspirin EC 81 MG tablet Take 81 mg by mouth daily.   Yes Historical Provider, MD  atorvastatin (LIPITOR) 40 MG tablet take 1 tablet by mouth once daily Patient taking differently: take 1 tablet by mouth once daily in the evening 07/06/15  Yes [name], MD  B Complex Vitamins (B COMPLEX-B12 PO) Take 1 capsule by mouth every evening.    Yes Historical Provider, MD  calcium-vitamin D (OSCAL WITH D) 500-200 MG-UNIT per tablet Take 1 tablet by mouth every evening.    Yes Historical Provider, MD  carboxymethylcellulose (REFRESH PLUS) 0.5 % SOLN Place 2 drops into both eyes daily.   Yes Historical Provider, MD  Coenzyme Q10 200 MG capsule Take 200 mg by mouth every evening.    Yes Historical Provider, MD  furosemide (LASIX) 40 MG tablet take 1 tablet by mouth twice a day Patient taking differently: take 1 tablet by mouth daily 08/04/15  Yes [name], MD  HYDROcodone-acetaminophen (NORCO/VICODIN) 5-325 MG tablet Take 1 tablet by mouth every 4 (four) hours as needed. Patient taking differently: Take 1 tablet by mouth every 4 (four) hours as needed (for pain.).  02/07/16  Yes Dorie Rank, MD  losartan (COZAAR) 50 MG tablet take 1 tablet by mouth once  daily 07/06/15  Yes [name], MD  Menthol, Topical Analgesic, (BLUE-EMU MAXIMUM STRENGTH EX) Apply 1 application topically 3 (three) times daily as needed (for knee pain/joint pain.).   Yes Historical Provider, MD  Multiple Vitamins-Minerals (CENTRUM SILVER ULTRA WOMENS) TABS Take 1 tablet by mouth every evening.    Yes Historical Provider, MD  Omega-3 Fatty Acids (FISH OIL) 1200 MG CAPS Take 1,200 mg by mouth every evening.    Yes Historical Provider, MD  sodium chloride (OCEAN) 0.65 % nasal spray Place 1 spray into the nose daily. After shower   Yes Historical Provider, MD  acetaminophen (TYLENOL) 325 MG tablet Take 650 mg by mouth every 6 (six) hours as needed for moderate pain.     Historical Provider, MD  PROAIR HFA 108 (90 Base) MCG/ACT inhaler inhale 2 puffs INTO THE LUNGS every 4 hours if needed for  wheezing or shortness of breath ( COUGH SHORTNESS OF BREATH AND WHEEZING) 01/07/16   [name], MD    HOSPITAL MEDICATIONS: I have reviewed the patient's current medications.  VITALS: Blood pressure (!) 114/55, pulse (!) 130, temperature 97.9 F (36.6 C), temperature source Oral, resp. rate 17, height 5' 2"  (1.575 m), weight 164 lb (74.4 kg), SpO2 94 %.  PHYSICAL EXAM: General appearance: alert, cooperative and no distress Neck: no carotid bruit and no JVD Lungs: clear to auscultation bilaterally Heart: irregularly irregular rhythm Abdomen: soft, non-tender; bowel sounds normal; no masses,  no organomegaly Pulses: 2+ and symmetric Skin: pale cool dry Neurologic: Grossly normal  LABS: No results found for this or any previous visit (from the past 24 hour(s)).  EKG: EKG- HR 108  IMAGING: No results found.  IMPRESSION: Active Problems:   Hyperlipidemia   HTN (hypertension)   Atrial fibrillation, new onset (HCC)   Ankle fracture, left-s/p surgery 02/18/16   RECOMMENDATION: Agree with IV Diltiazem. She is CHADS2 VASC=4 for age, sex. and HTN. She will need to be on  anticoagulation when OK with Dr Doran Durand. Will add beta blocker and titrate Diltiazem for rate control. Plan echo when HR under better control. Document TSH. MD to see.  Time Spent Directly with Patient: [address] minutes  Kerin Ransom, [place] beeper 02/18/2016, 4:46 PM

## 2016-02-18 NOTE — Anesthesia Procedure Notes (Signed)
Anesthesia Regional Block:  Adductor canal block  Pre-Anesthetic Checklist: ,, timeout performed, Correct Patient, Correct Site, Correct Laterality, Correct Procedure, Correct Position, site marked, Risks and benefits discussed,  Surgical consent,  Pre-op evaluation,  At surgeon's request and post-op pain management  Laterality: Left  Prep: chloraprep       Needles:  Injection technique: Single-shot  Needle Type: Stimiplex     Needle Length: 9cm 9 cm Needle Gauge: 21 and 21 G    Additional Needles:  Procedures: ultrasound guided (picture in chart) Adductor canal block Narrative:  Start time: 02/18/2016 2:25 PM End time: 02/18/2016 2:30 PM Injection made incrementally with aspirations every 5 mL.  Performed by: Personally  Anesthesiologist: Nolon Nations  Additional Notes: BP cuff, EKG monitors applied. Sedation begun. Artery and nerve location verified with U/S and anesthetic injected incrementally, slowly, and after negative aspirations under direct u/s guidance. Good fascial /perineural spread. Tolerated well.

## 2016-02-18 NOTE — Anesthesia Procedure Notes (Signed)
Procedure Name: LMA Insertion Date/Time: 02/18/2016 2:52 PM Performed by: Garrison [place] T Pre-anesthesia Checklist: Patient identified, Emergency Drugs available, Suction available and Patient being monitored Patient Re-evaluated:Patient Re-evaluated prior to inductionOxygen Delivery Method: Circle System Utilized Preoxygenation: Pre-oxygenation with 100% oxygen Intubation Type: IV induction LMA: LMA inserted LMA Size: 4.0 Number of attempts: 1 Airway Equipment and Method: Bite block Placement Confirmation: positive ETCO2 and breath sounds checked- equal and bilateral Tube secured with: Tape Dental Injury: Teeth and Oropharynx as per pre-operative assessment

## 2016-02-18 NOTE — Anesthesia Preprocedure Evaluation (Signed)
Anesthesia Evaluation  Patient identified by MRN, date of birth, ID band Patient awake    Reviewed: Allergy & Precautions, NPO status , Patient's Chart, lab work & pertinent test results  Airway Mallampati: II  TM Distance: >3 FB Neck ROM: Full    Dental no notable dental hx.    Pulmonary pneumonia,    Pulmonary exam normal breath sounds clear to auscultation       Cardiovascular hypertension, Normal cardiovascular exam Rhythm:Regular Rate:Normal     Neuro/Psych PSYCHIATRIC DISORDERS Anxiety negative neurological ROS     GI/Hepatic negative GI ROS, Neg liver ROS,   Endo/Other  negative endocrine ROS  Renal/GU CRFRenal disease     Musculoskeletal  (+) Arthritis ,   Abdominal   Peds  Hematology  (+) anemia ,   Anesthesia Other Findings   Reproductive/Obstetrics negative OB ROS                             Anesthesia Physical Anesthesia Plan  ASA: II  Anesthesia Plan: General and Regional   Post-op Pain Management: GA combined w/ Regional for post-op pain   Induction: Intravenous  Airway Management Planned: LMA  Additional Equipment:   Intra-op Plan:   Post-operative Plan: Extubation in OR  Informed Consent: I have reviewed the patients History and Physical, chart, labs and discussed the procedure including the risks, benefits and alternatives for the proposed anesthesia with the patient or authorized representative who has indicated his/her understanding and acceptance.   Dental advisory given  Plan Discussed with: CRNA  Anesthesia Plan Comments:         Anesthesia Quick Evaluation

## 2016-02-18 NOTE — Transfer of Care (Signed)
Immediate Anesthesia Transfer of Care Note  Patient: Brenda Matthews  Procedure(s) Performed: Procedure(s) with comments: OPEN REDUCTION INTERNAL FIXATION (ORIF) Right ankle trimalleolar fracture (Left) - requests 75mns  Patient Location: PACU  Anesthesia Type:General and Regional  Level of Consciousness: awake, alert  and oriented  Airway & Oxygen Therapy: Patient Spontanous Breathing and Patient connected to nasal cannula oxygen  Post-op Assessment: Report given to RN, Post -op Vital signs reviewed and stable and Patient moving all extremities X 4  Post vital signs: Reviewed and stable  Last Vitals:  Vitals:   02/18/16 1238  BP: (!) 148/74  Pulse: 75  Resp: 18  Temp: 36.6 C    Last Pain:  Vitals:   02/18/16 1251  TempSrc:   PainSc: 3       Patients Stated Pain Goal: 3 ([id number]  Complications: No apparent anesthesia complications

## 2016-02-18 NOTE — H&P (Signed)
Brenda Matthews is an 81 y.o. female.   Chief Complaint: left ankle fracture HPI: 81 y/o female with left ankle trimal fracture.  She presents now for ORIF of this unstable displaced ankle fracture.  Past Medical History:  Diagnosis Date  . Abnormal EKG approx 2008   Nuclear stress test neg;   . Anxiety    with panic  . Arthritis   . Cataract    s/p surgery--lens implants  . Chronic renal insufficiency, stage II (mild) 12/04/2012   GFR 60s  . Diverticulosis 2009 colonoscopy  . Fracture of radial shaft, left, closed 11/16/10   fell down flight of stairs  . History of kidney stones   . Hx of adenomatous colonic polyps 2002   surveillance colonoscopy 2009, +polypectomy done-tubular adenoma w/out high grade.  05/2013 tubular adenomas--recall 3 yrs  . Hyperlipidemia   . Hypertension   . Osteopenia    DEXA 08/2010; repeat DEXA 02/2015 worse: fosamax started  . Peripheral edema   . Pneumonia 2015   hx  . Rheumatic fever     Past Surgical History:  Procedure Laterality Date  . APPENDECTOMY  1966   done during surgery for tubal pregnancy  . CATARACT EXTRACTION W/ INTRAOCULAR LENS IMPLANT  2013   bilat  . COLONOSCOPY W/ POLYPECTOMY  05/2013   +diverticulosis; recall 3 yrs (Dr. Henrene Pastor)  . DEXA  02/2015   T score -2.1 in both femoral necks; FRAX 10 yr risk of major osteoporotic fracture was 21%---fosamax started  . EYE SURGERY    . ORIF RADIAL FRACTURE  11/18/10   left; s/p slip on slippery floor and fell  . TONSILLECTOMY      Family History  Problem Relation Age of Onset  . Heart disease Mother   . Heart disease Father   . Hypertension Brother   . Colon cancer Neg Hx   . Pancreatic cancer Neg Hx   . Rectal cancer Neg Hx   . Stomach cancer Neg Hx    Social History:  reports that she has never smoked. She has never used smokeless tobacco. She reports that she drinks alcohol. She reports that she does not use drugs.  Allergies:  Allergies  Allergen Reactions  . Augmentin  [Amoxicillin-Pot Clavulanate] Nausea And Vomiting    "projectile vomiting" Has patient had a PCN reaction causing immediate rash, facial/tongue/throat swelling, SOB or lightheadedness with hypotension:No Has patient had a PCN reaction causing severe rash involving mucus membranes or skin necrosis:No Has patient had a PCN reaction that required hospitalization:No Has patient had a PCN reaction occurring within the last 10 years:Yes If all of the above answers are "NO", then may proceed with Cephalosporin use.     Medications Prior to Admission  Medication Sig Dispense Refill  . alendronate (FOSAMAX) 70 MG tablet Take 1 tablet (70 mg total) by mouth every 7 (seven) days. Take with a full glass of water on an empty stomach. (Patient taking differently: Take 70 mg by mouth every Monday. Take with a full glass of water on an empty stomach.) 4 tablet 11  . ALPRAZolam (XANAX) 0.5 MG tablet take 1 tablet by mouth three times a day if needed for STRESS 90 tablet 5  . amLODipine (NORVASC) 10 MG tablet Take 1 tablet (10 mg total) by mouth daily. (Patient taking differently: Take 10 mg by mouth every evening. ) 90 tablet 3  . Ascorbic Acid (VITAMIN C) 500 MG tablet Take 1,000 mg by mouth every evening.     Marland Kitchen  aspirin EC 81 MG tablet Take 81 mg by mouth daily.    Marland Kitchen atorvastatin (LIPITOR) 40 MG tablet take 1 tablet by mouth once daily (Patient taking differently: take 1 tablet by mouth once daily in the evening) 90 tablet 3  . B Complex Vitamins (B COMPLEX-B12 PO) Take 1 capsule by mouth every evening.     . calcium-vitamin D (OSCAL WITH D) 500-200 MG-UNIT per tablet Take 1 tablet by mouth every evening.     . carboxymethylcellulose (REFRESH PLUS) 0.5 % SOLN Place 2 drops into both eyes daily.    . Coenzyme Q10 200 MG capsule Take 200 mg by mouth every evening.     . furosemide (LASIX) 40 MG tablet take 1 tablet by mouth twice a day (Patient taking differently: take 1 tablet by mouth daily) 180 tablet 1  .  HYDROcodone-acetaminophen (NORCO/VICODIN) 5-325 MG tablet Take 1 tablet by mouth every 4 (four) hours as needed. (Patient taking differently: Take 1 tablet by mouth every 4 (four) hours as needed (for pain.). ) 12 tablet 0  . losartan (COZAAR) 50 MG tablet take 1 tablet by mouth once daily 90 tablet 3  . Menthol, Topical Analgesic, (BLUE-EMU MAXIMUM STRENGTH EX) Apply 1 application topically 3 (three) times daily as needed (for knee pain/joint pain.).    Marland Kitchen Multiple Vitamins-Minerals (CENTRUM SILVER ULTRA WOMENS) TABS Take 1 tablet by mouth every evening.     . Omega-3 Fatty Acids (FISH OIL) 1200 MG CAPS Take 1,200 mg by mouth every evening.     . sodium chloride (OCEAN) 0.65 % nasal spray Place 1 spray into the nose daily. After shower    . acetaminophen (TYLENOL) 325 MG tablet Take 650 mg by mouth every 6 (six) hours as needed for moderate pain.     Marland Kitchen PROAIR HFA 108 (90 Base) MCG/ACT inhaler inhale 2 puffs INTO THE LUNGS every 4 hours if needed for wheezing or shortness of breath ( COUGH SHORTNESS OF BREATH AND WHEEZING) 1 Inhaler 1    Results for orders placed or performed during the hospital encounter of 02/17/16 (from the past 48 hour(s))  CBC     Status: None   Collection Time: 02/17/16  4:30 PM  Result Value Ref Range   WBC 8.0 4.0 - 10.5 K/uL   RBC 4.54 3.87 - 5.11 MIL/uL   Hemoglobin 12.9 12.0 - 15.0 g/dL   HCT 40.5 36.0 - 46.0 %   MCV 89.2 78.0 - 100.0 fL   MCH 28.4 26.0 - 34.0 pg   MCHC 31.9 30.0 - 36.0 g/dL   RDW 13.3 11.5 - 15.5 %   Platelets 329 150 - 400 K/uL  Basic metabolic panel     Status: Abnormal   Collection Time: 02/17/16  4:30 PM  Result Value Ref Range   Sodium 140 135 - 145 mmol/L   Potassium 4.2 3.5 - 5.1 mmol/L   Chloride 101 101 - 111 mmol/L   CO2 26 22 - 32 mmol/L   Glucose, Bld 124 (H) 65 - 99 mg/dL   BUN 23 (H) 6 - 20 mg/dL   Creatinine, Ser 1.18 (H) 0.44 - 1.00 mg/dL   Calcium 9.7 8.9 - 10.3 mg/dL   GFR calc non Af Amer 43 (L) >60 mL/min   GFR calc  Af Amer 49 (L) >60 mL/min    Comment: (NOTE) The eGFR has been calculated using the CKD EPI equation. This calculation has not been validated in all clinical situations. eGFR's persistently <60 mL/min signify  possible Chronic Kidney Disease.    Anion gap 13 5 - 15   No results found.  ROS  No recent f/c/n/v/wt loss  Blood pressure (!) 148/74, pulse 75, temperature 97.9 F (36.6 C), temperature source Oral, resp. rate 18, height 5' 2"  (1.575 m), weight 74.4 kg (164 lb), SpO2 98 %. Physical Exam  wn wd woman in nad.  A and O x 4.  Mood and affect normal.  EOMi.  resp unlabored.  L ankle with healthy skin.  No blisters.  Moderate sweling.  No lymphadenopathy.  5/5 strength in PF and DF of the toes.  Sens to LT intact at the forefoot.  Assessment/Plan L ankle trimal fracture - to OR for ORIF. The risks and benefits of the alternative treatment options have been discussed in detail.  The patient wishes to proceed with surgery and specifically understands risks of bleeding, infection, nerve damage, blood clots, need for additional surgery, amputation and death.   Wylene Simmer, MD 2016/03/06, 2:26 PM

## 2016-02-18 NOTE — Brief Op Note (Signed)
02/18/2016  4:09 PM  PATIENT:  Brenda Matthews  81 y.o. female  PRE-OPERATIVE DIAGNOSIS:  LEFT ankle trimalleolar fracture   POST-OPERATIVE DIAGNOSIS:  LEFT ankle trimalleolar fracture  Procedure(s): 1.  Open treatment of left ankle trimalleolar fracture with internal fixation without fixation of the posterior malleolus 2.  AP, mortise and lateral xrays of the left ankle  SURGEON:  Wylene Simmer, MD  ASSISTANT: n/a  ANESTHESIA:   General, regional  EBL:  minimal   TOURNIQUET:   Total Tourniquet Time Documented: Thigh (Left) - 45 minutes Total: Thigh (Left) - 45 minutes  COMPLICATIONS:  None apparent  DISPOSITION:  Extubated, awake and stable to recovery.  DICTATION ID: [id number]

## 2016-02-18 NOTE — Anesthesia Postprocedure Evaluation (Signed)
Anesthesia Post Note  Patient: Brenda Matthews  Procedure(s) Performed: Procedure(s) (LRB): OPEN REDUCTION INTERNAL FIXATION (ORIF) Right ankle trimalleolar fracture (Left)  Patient location during evaluation: PACU Anesthesia Type: Regional and General Level of consciousness: sedated and patient cooperative Pain management: pain level controlled Vital Signs Assessment: post-procedure vital signs reviewed and stable Respiratory status: spontaneous breathing Cardiovascular status: stable Anesthetic complications: no       Last Vitals:  Vitals:   02/18/16 2000 02/18/16 2025  BP:    Pulse: (!) 131 (!) 111  Resp: (!) 23 (!) 25  Temp: 36.7 C 36.8 C    Last Pain:  Vitals:   02/18/16 2025  TempSrc: Oral  PainSc:                  Nolon Nations

## 2016-02-18 NOTE — Progress Notes (Signed)
Called answering service for [facility] and spoke with [facility] regarding patients request for Xanax 0.26m as she takes at home. Patient is very anxious and in tears. Son at bedside.

## 2016-02-18 NOTE — Anesthesia Procedure Notes (Addendum)
Anesthesia Regional Block:  Popliteal block  Pre-Anesthetic Checklist: ,, timeout performed, Correct Patient, Correct Site, Correct Laterality, Correct Procedure, Correct Position, site marked, Risks and benefits discussed,  Surgical consent,  Pre-op evaluation,  At surgeon's request and post-op pain management  Laterality: Left  Prep: chloraprep       Needles:  Injection technique: Single-shot  Needle Type: Stimiplex     Needle Length: 10cm 10 cm Needle Gauge: 21 and 21 G    Additional Needles:  Procedures: ultrasound guided (picture in chart)  Motor weakness within 5 minutes. Popliteal block  Nerve Stimulator or Paresthesia:  Response: Plantar flexion/toe flexion, 0.5 mA,   Additional Responses:   Narrative:  Start time: 02/18/2016 2:31 PM End time: 02/18/2016 2:36 PM Injection made incrementally with aspirations every 5 mL.  Performed by: Personally  Anesthesiologist: Nolon Nations  Additional Notes: Nerve located and needle positioned with direct ultrasound guidance. Good perineural spread. Patient tolerated well.

## 2016-02-19 ENCOUNTER — Encounter (HOSPITAL_COMMUNITY): Payer: Self-pay | Admitting: Orthopedic Surgery

## 2016-02-19 ENCOUNTER — Inpatient Hospital Stay (HOSPITAL_COMMUNITY): Payer: Medicare Other

## 2016-02-19 ENCOUNTER — Other Ambulatory Visit (HOSPITAL_COMMUNITY): Payer: Medicare Other

## 2016-02-19 DIAGNOSIS — R7989 Other specified abnormal findings of blood chemistry: Secondary | ICD-10-CM | POA: Clinically undetermined

## 2016-02-19 DIAGNOSIS — I4891 Unspecified atrial fibrillation: Secondary | ICD-10-CM

## 2016-02-19 LAB — ECHOCARDIOGRAM COMPLETE
CHL CUP RV SYS PRESS: 34 mmHg
CHL CUP TV REG PEAK VELOCITY: 278 cm/s
E decel time: 208 msec
E/e' ratio: 9.22
FS: 36 % (ref 28–44)
HEIGHTINCHES: 62 in
IV/PV OW: 0.86
LA diam end sys: 37 mm
LA diam index: 2.1 cm/m2
LASIZE: 37 mm
LAVOLA4C: 54.9 mL
LV E/e' medial: 9.22
LV E/e'average: 9.22
LV PW d: 10.6 mm — AB (ref 0.6–1.1)
LVELAT: 10 cm/s
LVOT VTI: 22.7 cm
LVOT area: 3.46 cm2
LVOTD: 21 mm
LVOTPV: 96.8 cm/s
LVOTSV: 79 mL
MV Dec: 208
MV Peak grad: 3 mmHg
MV pk A vel: 103 m/s
MV pk E vel: 92.2 m/s
P 1/2 time: 520 ms
RV LATERAL S' VELOCITY: 11.1 cm/s
TAPSE: 17.4 mm
TDI e' lateral: 10
TDI e' medial: 7.71
TRMAXVEL: 278 cm/s
WEIGHTICAEL: 2624 [oz_av]

## 2016-02-19 LAB — CBC WITH DIFFERENTIAL/PLATELET
BASOS ABS: 0 10*3/uL (ref 0.0–0.1)
Basophils Relative: 0 %
Eosinophils Absolute: 0.1 10*3/uL (ref 0.0–0.7)
Eosinophils Relative: 1 %
HEMATOCRIT: 39.9 % (ref 36.0–46.0)
HEMOGLOBIN: 12.6 g/dL (ref 12.0–15.0)
LYMPHS PCT: 13 %
Lymphs Abs: 1.5 10*3/uL (ref 0.7–4.0)
MCH: 28.4 pg (ref 26.0–34.0)
MCHC: 31.6 g/dL (ref 30.0–36.0)
MCV: 90.1 fL (ref 78.0–100.0)
Monocytes Absolute: 0.8 10*3/uL (ref 0.1–1.0)
Monocytes Relative: 7 %
NEUTROS ABS: 8.7 10*3/uL — AB (ref 1.7–7.7)
Neutrophils Relative %: 79 %
Platelets: 302 10*3/uL (ref 150–400)
RBC: 4.43 MIL/uL (ref 3.87–5.11)
RDW: 13.8 % (ref 11.5–15.5)
WBC: 11.1 10*3/uL — AB (ref 4.0–10.5)

## 2016-02-19 LAB — URINALYSIS, ROUTINE W REFLEX MICROSCOPIC
Bilirubin Urine: NEGATIVE
Glucose, UA: NEGATIVE mg/dL
Hgb urine dipstick: NEGATIVE
Ketones, ur: NEGATIVE mg/dL
Nitrite: NEGATIVE
PH: 5 (ref 5.0–8.0)
Protein, ur: NEGATIVE mg/dL
SPECIFIC GRAVITY, URINE: 1.014 (ref 1.005–1.030)

## 2016-02-19 LAB — COMPREHENSIVE METABOLIC PANEL
ALBUMIN: 3.2 g/dL — AB (ref 3.5–5.0)
ALT: 16 U/L (ref 14–54)
ANION GAP: 10 (ref 5–15)
AST: 21 U/L (ref 15–41)
Alkaline Phosphatase: 83 U/L (ref 38–126)
BILIRUBIN TOTAL: 0.8 mg/dL (ref 0.3–1.2)
BUN: 17 mg/dL (ref 6–20)
CHLORIDE: 106 mmol/L (ref 101–111)
CO2: 25 mmol/L (ref 22–32)
Calcium: 9.2 mg/dL (ref 8.9–10.3)
Creatinine, Ser: 0.95 mg/dL (ref 0.44–1.00)
GFR calc Af Amer: >60 mL/min (ref >60)
GFR, EST NON AFRICAN AMERICAN: 55 mL/min — AB (ref >60)
Glucose, Bld: 127 mg/dL — ABNORMAL HIGH (ref 65–99)
POTASSIUM: 4.3 mmol/L (ref 3.5–5.1)
Sodium: 141 mmol/L (ref 135–145)
TOTAL PROTEIN: 6.6 g/dL (ref 6.5–8.1)

## 2016-02-19 LAB — MRSA PCR SCREENING: MRSA BY PCR: NEGATIVE

## 2016-02-19 LAB — T4, FREE: Free T4: 1.99 ng/dL — ABNORMAL HIGH (ref 0.61–1.12)

## 2016-02-19 LAB — MAGNESIUM: MAGNESIUM: 2.1 mg/dL (ref 1.7–2.4)

## 2016-02-19 LAB — PHOSPHORUS: PHOSPHORUS: 3 mg/dL (ref 2.5–4.6)

## 2016-02-19 LAB — TROPONIN I: Troponin I: <0.03 ng/mL (ref <0.03)

## 2016-02-19 LAB — TSH: TSH: 0.271 u[IU]/mL — ABNORMAL LOW (ref 0.350–4.500)

## 2016-02-19 MED ORDER — DOCUSATE SODIUM 100 MG PO CAPS: 100.0000 mg | ORAL_CAPSULE | Freq: Two times a day (BID) | ORAL | 0 refills | Status: DC

## 2016-02-19 MED ORDER — SENNA 8.6 MG PO TABS: 2.0000 | ORAL_TABLET | Freq: Two times a day (BID) | ORAL | 0 refills | Status: DC

## 2016-02-19 MED ORDER — RIVAROXABAN 10 MG PO TABS: 10.0000 mg | ORAL_TABLET | Freq: Every day | ORAL | 0 refills | Status: DC

## 2016-02-19 MED ORDER — RIVAROXABAN 20 MG PO TABS
20.0000 mg | ORAL_TABLET | Freq: Every day | ORAL | Status: DC
  Administered 2016-02-19 – 2016-02-20 (×2): 20 mg via ORAL
  Filled 2016-02-19 (×2): qty 1

## 2016-02-19 MED ORDER — HYDRALAZINE HCL 20 MG/ML IJ SOLN: 10.0000 mg | Freq: Three times a day (TID) | INTRAMUSCULAR | Status: DC | PRN

## 2016-02-19 MED ORDER — RIVAROXABAN 15 MG PO TABS
15.0000 mg | ORAL_TABLET | Freq: Every day | ORAL | Status: DC
  Filled 2016-02-19: qty 1

## 2016-02-19 MED ORDER — OXYCODONE HCL 5 MG PO TABS: 5.0000 mg | ORAL_TABLET | ORAL | 0 refills | Status: DC | PRN

## 2016-02-19 NOTE — Op Note (Signed)
NAMERICKI, VANHANDEL.:  [id number]  MEDICAL RECORD NO.:  [id number]  LOCATION:                                 FACILITY:  PHYSICIAN:  Wylene Simmer, MD             DATE OF BIRTH:  DATE OF PROCEDURE:  02/18/2016 DATE OF DISCHARGE:                              OPERATIVE REPORT   PREOPERATIVE DIAGNOSIS:  Left ankle trimalleolar fracture (closed and displaced).  POSTOPERATIVE DIAGNOSIS:  Left ankle trimalleolar fracture (closed and displaced).  PROCEDURES: 1. Open treatment of left ankle trimalleolar fracture with internal     fixation without fixation of the posterior malleolus. 2. AP, mortise, and lateral radiographs of the left ankle.  SURGEON:  Wylene Simmer, MD.  ANESTHESIA:  General, regional.  ESTIMATED BLOOD LOSS:  Minimal.  TOURNIQUET TIME:  45 minutes at 350 mmHg.  COMPLICATIONS:  None apparent.  DISPOSITION:  Extubated awake and stable to recovery.  INDICATIONS FOR PROCEDURE:  The patient is a 81 year old woman who is admitted today for surgical treatment of her unstable and displaced left ankle trimalleolar fracture.  She understands the risks and benefits of the alternative treatment options and elects surgical treatment.  She specifically understands risks of bleeding, infection, nerve damage, blood clots, need for additional surgery, continued pain, nonunion, amputation, and death.  PROCEDURE IN DETAIL:  After preoperative consent was obtained and the correct operative site was identified, the patient was brought to the operating room and placed supine on the operating table.  General anesthesia was induced.  Preoperative antibiotics were administered. Surgical time-out was taken.  Left lower extremity was prepped and draped in standard sterile fashion, tourniquet around the thigh.  The extremity was exsanguinated and tourniquet was inflated to 350 mmHg.  A longitudinal incision was made over the lateral malleolus.  Sharp dissection  was carried down through skin and subcutaneous tissue.  The fracture site was identified.  It was cleaned of all hematoma and irrigated.  Fracture was reduced.  The patient's bone was noted to be extremely soft with some comminution at the fracture site.  A 7-hole ALPS locking composite plate was selected.  It was contoured to fit the lateral malleolus.  It was secured distally with 3 locking screws.  It was then used as a reduction aid to hold the fracture in a reduced position.  The fracture was compressed appropriately and the plate was secured to the fibula with 3 bicortical screws.  AP and lateral radiographs confirmed appropriate reduction of the fracture and appropriate position length of all hardware.  Attention was then turned to the medial malleolus where a longitudinal incision was made.  Sharp dissection was carried down through the skin and subcutaneous tissue.  The fracture site was identified.  Again, the bone was noted to be extremely soft with comminution at the fracture site.  Fracture was reduced and provisionally pinned.  The fracture was noted to be a long oblique fracture with a tendency to displace proximally and medially.  A 3-hole 1/3rd tubular buttress plate was placed over the apex of the fracture and secured proximally with 2  bicortical 3.5 mm fully-threaded screws from the [employer] small frag set. The K-wire was then overdrilled and a 4 mm x 40 mm partially threaded cannulated screw was inserted.  A second screw was inserted parallel to the first at the tip of the medial malleolus.  AP, mortise, and lateral radiographs confirmed appropriate position length of all hardware and appropriate reduction of the medial and lateral malleolus fractures. The posterior malleolus fracture was noted to be quite small and was well reduced.  It did not involve significant portion of the articular surface of the distal tibia.  Both wounds were then irrigated copiously. Deep and  subcutaneous tissues were approximated with Vicryl.  The skin incisions were closed with 3-0 nylon.  Sterile dressings were applied followed by well-padded short-leg splint.  Tourniquet was released after application of the dressings at 45 minutes.  The patient was awakened from anesthesia and transported to the recovery room in stable condition.  FOLLOWUP PLAN:  The patient will be admitted and will have Physical Therapy and Occupational therapy consults.  During surgery, she was noted to have new onset of atrial fibrillation.  Cardiology consult will be obtained and the patient will be placed on telemetry.  She will start Lovenox for DVT prophylaxis.  RADIOGRAPHS:  AP mortise and lateral radiographs of the left ankle were obtained intraoperatively.  These show interval reduction and fixation of the left ankle trimalleolar fracture.  Hardware is appropriately positioned of the appropriate length.     Wylene Simmer, MD     JH/MEDQ  D:  02/18/2016  T:  02/19/2016  Job:  [id number]

## 2016-02-19 NOTE — Progress Notes (Addendum)
ANTICOAGULATION CONSULT NOTE - Initial Consult  Pharmacy Consult for Rivaroxaban Indication: atrial fibrillation  Allergies  Allergen Reactions  . Augmentin [Amoxicillin-Pot Clavulanate] Nausea And Vomiting    "projectile vomiting" Has patient had a PCN reaction causing immediate rash, facial/tongue/throat swelling, SOB or lightheadedness with hypotension:No Has patient had a PCN reaction causing severe rash involving mucus membranes or skin necrosis:No Has patient had a PCN reaction that required hospitalization:No Has patient had a PCN reaction occurring within the last 10 years:Yes If all of the above answers are "NO", then may proceed with Cephalosporin use.     Patient Measurements: Height: 5' 2"  (157.5 cm) Weight: 164 lb (74.4 kg) IBW/kg (Calculated) : 50.1   Vital Signs: Temp: 98 F (36.7 C) (02/02 0729) Temp Source: Oral (02/02 0729) BP: 113/65 (02/02 0910) Pulse Rate: 81 (02/02 0910)  Labs:  Recent Labs  02/17/16 1630 02/19/16 0901  HGB 12.9 12.6  HCT 40.5 39.9  PLT 329 302  CREATININE 1.18*  --     Estimated Creatinine Clearance: 36.5 mL/min (by C-G formula based on SCr of 1.18 mg/dL (H)).   Medical History: Past Medical History:  Diagnosis Date  . Abnormal EKG approx 2008   Nuclear stress test neg;   . Anxiety    with panic  . Arthritis   . Cataract    s/p surgery--lens implants  . Chronic renal insufficiency, stage II (mild) 12/04/2012   GFR 60s  . Diverticulosis 2009 colonoscopy  . Fracture of radial shaft, left, closed 11/16/10   fell down flight of stairs  . History of kidney stones   . Hx of adenomatous colonic polyps 2002   surveillance colonoscopy 2009, +polypectomy done-tubular adenoma w/out high grade.  05/2013 tubular adenomas--recall 3 yrs  . Hyperlipidemia   . Hypertension   . Osteopenia    DEXA 08/2010; repeat DEXA 02/2015 worse: fosamax started  . Peripheral edema   . Pneumonia 2015   hx  . Rheumatic fever       Assessment: 81yof admitted for ORIF L ankle after Fx.  She developed post op Afib RVR now CVR on metoprolol.  Plan rivaroxaban for VTE px and stroke prevention with Afib.  CrCl > 50 ml/min with actual body wt and actual Cr 0.9 No bleeding, CBC stable.   Goal of Therapy:  Monitor platelets by anticoagulation protocol: Yes   Plan:  Rivaroxaban 20 mg daily  - with a meal BMET and CBC Monitor for s/s bleeding  Bonnita Nasuti Pharm.D. CPP, BCPS Clinical Pharmacist [phone] 02/19/2016 10:35 AM

## 2016-02-19 NOTE — Clinical Social Work Note (Signed)
Clinical Social Work Assessment  Patient Details  Name: Brenda Matthews MRN: [id number] Date of Birth: 21-May-1935  Date of referral:  02/19/16               Reason for consult:  Facility Placement, Discharge Planning                Permission sought to share information with:  Facility Sport and exercise psychologist, Family Supports Permission granted to share information::  Yes, Verbal Permission Granted  Name::     Katlynn Naser  Agency::  SNF's  Relationship::  Son  Contact Information:  [phone]  Housing/Transportation Living arrangements for the past 2 months:  [place] of Information:  Patient, Medical Team, Adult Children, Other (Comment Required) (Son's significant other) Patient Interpreter Needed:  None Criminal Activity/Legal Involvement Pertinent to Current Situation/Hospitalization:  No - Comment as needed Significant Relationships:  Adult Children, Other Family Members Lives with:  Self Do you feel safe going back to the place where you live?  No Need for family participation in patient care:  Yes (Comment)  Care giving concerns:  MD recommending SNF. Awaiting PT evaluation.   Social Worker assessment / plan:  CSW met with patient. Son and son's significant other at bedside. CSW introduced role and explained that discharge planning would be discussed. MD recommending SNF. Awaiting PT evaluation. Patient's son and significant other wanted information on SNF before they go back to [place] where they live. CSW provided SNF list and explained the SNF process. [facility] submitted but is under manual review. No further concerns. CSW encouraged patient and her family to contact CSW as needed. CSW will continue to follow patient and her family for support and facilitate discharge to SNF once medically stable.  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 Patient's family supportive and involved in patient's care. Patient and her family appreciated social work intervention.  Patient/Family's Understanding of and Emotional Response to Diagnosis, Current Treatment, and Prognosis:  Patient and her family appear to have a good understanding of reason for admission. Patient and her family appear happy with hospital care.  Emotional Assessment Appearance:  Appears stated age Attitude/Demeanor/Rapport:  Other (Pleasant) Affect (typically observed):  Accepting, Appropriate, Calm, Pleasant Orientation:  Oriented to Self, Oriented to Place, Oriented to  Time, Oriented to Situation Alcohol / Substance use:  Never Used Psych involvement (Current and /or in the community):  No (Comment)  Discharge Needs  Concerns to be addressed:  Care Coordination Readmission within the last 30 days:  No Current discharge risk:  Dependent with Mobility, Lives alone Barriers to Discharge:  Awaiting State Approval Tour manager), Continued Medical Work up   Candie Chroman, LCSW 02/19/2016, 2:10 PM

## 2016-02-19 NOTE — Progress Notes (Signed)
Transferred to 5N02 by reclining chair, report given to RN, belongings taken by family.

## 2016-02-19 NOTE — Progress Notes (Deleted)
NIF -16, VT 134

## 2016-02-19 NOTE — Discharge Instructions (Addendum)
Wylene Simmer, MD [place]  Please read the following information regarding your care after surgery.  Medications  You only need a prescription for the narcotic pain medicine (ex. oxycodone, Percocet, Norco).  All of the other medicines listed below are available over the counter. X acetominophen (Tylenol) 650 mg every 4-6 hours as you need for minor pain X oxycodone as prescribed for moderate to severe pain ?   Narcotic pain medicine (ex. oxycodone, Percocet, Vicodin) will cause constipation.  To prevent this problem, take the following medicines while you are taking any pain medicine. X docusate sodium (Colace) 100 mg twice a day X senna (Senokot) 2 tablets twice a day  X To help prevent blood clots, take xarelto as prescribed after surgery.  You should also get up every hour while you are awake to move around.   X schedule an appointment with your primary care provider as soon as possible to discuss continued care with xarelto.  Weight Bearing X Do not bear any weight on the operated leg or foot.  Cast / Splint / Dressing X Keep your splint or cast clean and dry.  Dont put anything (coat hanger, pencil, etc) down inside of it.  If it gets damp, use a hair dryer on the cool setting to dry it.  If it gets soaked, call the office to schedule an appointment for a cast change.  After your dressing, cast or splint is removed; you may shower, but do not soak or scrub the wound.  Allow the water to run over it, and then gently pat it dry.  Swelling It is normal for you to have swelling where you had surgery.  To reduce swelling and pain, keep your toes above your nose for at least 3 days after surgery.  It may be necessary to keep your foot or leg elevated for several weeks.  If it hurts, it should be elevated.  Follow Up Call my office at [phone] when you are discharged from the hospital or surgery center to schedule an appointment to be seen two weeks after  surgery.  Call my office at [phone] if you develop a fever >101.5 F, nausea, vomiting, bleeding from the surgical site or severe pain.     Information on my medicine - XARELTO (Rivaroxaban)  This medication education was reviewed with me or my healthcare representative as part of my discharge preparation.   Why was Xarelto prescribed for you? Xarelto was prescribed for you to reduce the risk of a blood clot forming that can cause a stroke if you have a medical condition called atrial fibrillation (a type of irregular heartbeat).  This medication will also help to prevent blood clots from forming in your legs after orthopedic surgery.  What do you need to know about xarelto ? Take your Xarelto ONCE DAILY at the same time every day with your evening meal. If you have difficulty swallowing the tablet whole, you may crush it and mix in applesauce just prior to taking your dose.  Take Xarelto exactly as prescribed by your doctor and DO NOT stop taking Xarelto without talking to the doctor who prescribed the medication.  Stopping without other stroke prevention medication to take the place of Xarelto may increase your risk of developing a clot that causes a stroke.  Refill your prescription before you run out.  After discharge, you should have regular check-up appointments with your healthcare provider that is prescribing your Xarelto.  In the future your dose may need to  be changed if your kidney function or weight changes by a significant amount.  What do you do if you miss a dose? If you are taking Xarelto ONCE DAILY and you miss a dose, take it as soon as you remember on the same day then continue your regularly scheduled once daily regimen the next day. Do not take two doses of Xarelto at the same time or on the same day.   Important Safety Information A possible side effect of Xarelto is bleeding. You should call your healthcare provider right away if you experience any of  the following: ? Bleeding from an injury or your nose that does not stop. ? Unusual colored urine (red or dark brown) or unusual colored stools (red or black). ? Unusual bruising for unknown reasons. ? A serious fall or if you hit your head (even if there is no bleeding).  Some medicines may interact with Xarelto and might increase your risk of bleeding while on Xarelto. To help avoid this, consult your healthcare provider or pharmacist prior to using any new prescription or non-prescription medications, including herbals, vitamins, non-steroidal anti-inflammatory drugs (NSAIDs) and supplements.  This website has more information on Xarelto: [email or web address]

## 2016-02-19 NOTE — Evaluation (Signed)
Occupational Therapy Evaluation Patient Details Name: Brenda Matthews MRN: [id number] DOB: [birth year] Today's Date: 02/19/2016    History of Present Illness This 81 y.o. admitted for ORIF of Lt ankle trimal fx sustained when she fell on ice 1.5 weeks PTA.  Post op course complicated by onset of A-Fib with RVR.  PMH includes:  anxiety,  radial shaft fx, osteopenia   Clinical Impression   Pt admitted with above. She demonstrates the below listed deficits and will benefit from continued OT to maximize safety and independence with BADLs.  Pt presents to OT with generalized weakness, decreased activity tolerance, and pain.  She requires mod - max A for LB ADLs and min guard assist for pivot transfers only - she demonstrates difficulty maintaining WBing status when attempting to stand with RW.  She lives alone, and will need SNF level rehab at discharge.       Follow Up Recommendations  SNF    Equipment Recommendations  3 in 1 bedside commode    Recommendations for Other Services       Precautions / Restrictions Precautions Precautions: Fall Restrictions Weight Bearing Restrictions: Yes LLE Weight Bearing: Non weight bearing      Mobility Bed Mobility Overal bed mobility: Needs Assistance Bed Mobility: Supine to Sit     Supine to sit: Supervision;HOB elevated        Transfers Overall transfer level: Needs assistance Equipment used: Rolling walker (2 wheeled) Transfers: Sit to/from [facility] Sit to Stand: Min guard Stand pivot transfers: Min guard       General transfer comment: Pt able to safely perform stand pivot transfers while maintaining NWB status.  Attempted use of RW for standing, but pt with difficulty mainting weightbearing status     Balance Overall balance assessment: Needs assistance Sitting-balance support: Feet supported Sitting balance-Leahy Scale: Good     Standing balance support: Bilateral upper extremity supported Standing  balance-Leahy Scale: Poor Standing balance comment: reliant on bil. UE support                             ADL Overall ADL's : Needs assistance/impaired Eating/Feeding: Independent   Grooming: Wash/dry hands;Wash/dry face;Oral care;Brushing hair;Set up;Sitting   Upper Body Bathing: Set up;Sitting   Lower Body Bathing: Moderate assistance;Sit to/from stand   Upper Body Dressing : Set up;Sitting   Lower Body Dressing: Sit to/from stand;Maximal assistance   Toilet Transfer: Min guard;Stand-pivot;BSC   Toileting- Clothing Manipulation and Hygiene: Moderate assistance;Sit to/from stand       Functional mobility during ADLs: Min guard (stand pivot transfers only ) General ADL Comments: Pt demonstrates difficulty maintaining NWB status      Vision Vision Assessment?: No apparent visual deficits   Perception     Praxis      Pertinent Vitals/Pain Pain Assessment: Faces Faces Pain Scale: Hurts a little bit Pain Location: Lt LE  Pain Descriptors / Indicators: Operative site guarding Pain Intervention(s): Monitored during session     Hand Dominance Right   Extremity/Trunk Assessment Upper Extremity Assessment Upper Extremity Assessment: Generalized weakness   Lower Extremity Assessment Lower Extremity Assessment: Defer to PT evaluation   Cervical / Trunk Assessment Cervical / Trunk Assessment: Kyphotic   Communication Communication Communication: No difficulties   Cognition Arousal/Alertness: Awake/alert Behavior During Therapy: WFL for tasks assessed/performed Overall Cognitive Status: Within Functional Limits for tasks assessed  General Comments       Exercises Exercises: Other exercises Other Exercises Other Exercises: Pt instructed in chair push ups    Shoulder Instructions      Home Living Family/patient expects to be discharged to:: Skilled nursing facility Living Arrangements: Alone   Type of Home: House        Home Layout: Two level Alternate Level Stairs-Number of Steps: flight             Home Equipment: Walker - 2 wheels;Other (comment) (transport w/c )   Additional Comments: Pt's son and daughter in law have been assisting her since fall or she has had hired caregivers.   SHe has been sponge bathing       Prior Functioning/Environment Level of Independence: Needs assistance  Gait / Transfers Assistance Needed: Prior to fall, pt was independent without AD.   Since fall, Pt has been performing stand pivot transfers only  ADL's / Homemaking Assistance Needed: Prior to fall, pt was independent.  Since fall, she has required assist with ADLs             OT Problem List: Decreased strength;Decreased activity tolerance;Impaired balance (sitting and/or standing);Decreased knowledge of use of DME or AE;Decreased knowledge of precautions;Pain   OT Treatment/Interventions: Self-care/ADL training;Therapeutic exercise;DME and/or AE instruction;Therapeutic activities;Patient/family education;Balance training    OT Goals(Current goals can be found in the care plan section) Acute Rehab OT Goals Patient Stated Goal: to get stronger and regain independence  OT Goal Formulation: With patient Time For Goal Achievement: 03/04/16 Potential to Achieve Goals: Good ADL Goals Pt Will Perform Lower Body Bathing: with min assist;sit to/from stand Pt Will Perform Lower Body Dressing: with min assist;sit to/from stand Pt Will Transfer to Toilet: with min assist;ambulating;bedside commode;regular height toilet Pt Will Perform Toileting - Clothing Manipulation and hygiene: sit to/from stand;with min assist  OT Frequency: Min 2X/week   Barriers to D/C: Decreased caregiver support          Co-evaluation PT/OT/SLP Co-Evaluation/Treatment: Yes Reason for Co-Treatment: For patient/therapist safety          End of Session Equipment Utilized During Treatment: Rolling walker;Gait belt Nurse  Communication: Mobility status  Activity Tolerance: Patient tolerated treatment well Patient left: in chair;with call bell/phone within reach;with nursing/sitter in room   Time: 1357-1418 OT Time Calculation (min): 21 min Charges:  OT General Charges $OT Visit: 1 Procedure OT Evaluation $OT Eval Low Complexity: 1 Procedure G-Codes:    Zachariah Pavek M 27-Feb-2016, 2:41 PM

## 2016-02-19 NOTE — Progress Notes (Signed)
Subjective: 1 Day Post-Op Procedure(s) (LRB): OPEN REDUCTION INTERNAL FIXATION (ORIF) Right ankle trimalleolar fracture (Left)  Patient reports pain as mild to moderate.  Denies fever, chills, N/V, CP, SOB.  Reports that she experiences intermittent episodes of anxiety.  Denies hx of thryoid pathology.  Reports that she is trying to eat a little, but doesn't have much of an appetite.  Denies BM.  Objective:   VITALS:  Temp:  [97.3 F (36.3 C)-98.2 F (36.8 C)] 98 F (36.7 C) (02/02 0300) Pulse Rate:  [61-143] 76 (02/02 0600) Resp:  [11-29] 28 (02/02 0600) BP: (91-148)/(55-87) 91/57 (02/02 0600) SpO2:  [92 %-98 %] 94 % (02/02 0600) Weight:  [74.4 kg (164 lb)] 74.4 kg (164 lb) (02/01 1238)  General: WDWN patient in NAD. Psych:  Appropriate mood and affect. Neuro:  A&O x 3, Moving all extremities, sensation intact to light touch HEENT:  EOMs intact Chest:  Even non-labored respirations Skin:  Dressing/posterior splint C/D/I, no rashes or lesions Extremities: warm/dry, no visible edema, no erythema or echymosis.  No lymphadenopathy. Pulses: Popliteus 2+ MSK:  ROM: EHL/FHL intact, MMT: patient is able to perform quad set    LABS  Recent Labs  02/17/16 1630  HGB 12.9  WBC 8.0  PLT 329    Recent Labs  02/17/16 1630  NA 140  K 4.2  CL 101  CO2 26  BUN 23*  CREATININE 1.18*  GLUCOSE 124*   No results for input(s): LABPT, INR in the last 72 hours.   Assessment/Plan: 1 Day Post-Op Procedure(s) (LRB): OPEN REDUCTION INTERNAL FIXATION (ORIF) Right ankle trimalleolar fracture (Left)  NWB R LE Up with therapy TSH low, consult to internal medicine Plan to D/C to SNF when ready Xarelto dosing and A-fib management per cardiology recommendation Appreciate cardiology and medicine team's assistance.    Mechele Claude, PA-C, [employer] Office:  [phone]

## 2016-02-19 NOTE — Plan of Care (Signed)
Problem: Education: Goal: Understanding of medication regimen will improve Outcome: Not Progressing Patient very anxious and not ready to learn.

## 2016-02-19 NOTE — Consult Note (Signed)
[facility] Consult History and Physical  Brenda Matthews XTA:[id number] DOB: 1935/02/20 DOA: 02/18/2016  Requesting physician: Mechele Claude PA on behalf of Dr. Doran Durand Clinical request: Work-up of low TSH PCP: [name], MD   Chief Complaint: "I don't know what happened."  HPI: Brenda Matthews is a 81 y.o. female  with past medical history significant for anxiety, CKD2, hypertension, hyperlipidemia, peripheral edema, rheumatic fever, abnormal EKG with negative stress test as a consult from orthopedics for abnormal lab value. Patient states that she fractured her L ankle roughly a week ago. Came up to the hospital for preop on January 31 and there were no issues. Patient had her surgery on February 1. When patient was in postop she was told that she went into atrial fibrillation with RVR. Cardiology was then consulted. Patient states that during the accident where she broke her ankle she sustained no chest trauma. She denies being ill in the time prior to coming to the hospital. Patient states she does not have a history of heart disease or name arrhythmia. She states that one time a doctor told her her heartbeat was regular irregular. Patient denies any recent medication changes. Patient states she does not have a family history of heart attack or arrhythmia.  Positive for buttock pain which is positional due to her hospital bed. Denies headache, nausea, vomiting, diarrhea, rashes, chest pain, trouble breathing, dizziness, headache and blurry vision.  Review of Systems:  As per HPI otherwise 10 point review of systems negative.    Past Medical History:  Diagnosis Date  . Abnormal EKG approx 2008   Nuclear stress test neg;   . Anxiety    with panic  . Arthritis   . Cataract    s/p surgery--lens implants  . Chronic renal insufficiency, stage II (mild) 12/04/2012   GFR 60s  . Diverticulosis 2009 colonoscopy  . Fracture of radial shaft, left, closed 11/16/10   fell down flight of  stairs  . History of kidney stones   . Hx of adenomatous colonic polyps 2002   surveillance colonoscopy 2009, +polypectomy done-tubular adenoma w/out high grade.  05/2013 tubular adenomas--recall 3 yrs  . Hyperlipidemia   . Hypertension   . Osteopenia    DEXA 08/2010; repeat DEXA 02/2015 worse: fosamax started  . Peripheral edema   . Pneumonia 2015   hx  . Rheumatic fever    Past Surgical History:  Procedure Laterality Date  . APPENDECTOMY  1966   done during surgery for tubal pregnancy  . CATARACT EXTRACTION W/ INTRAOCULAR LENS IMPLANT  2013   bilat  . COLONOSCOPY W/ POLYPECTOMY  05/2013   +diverticulosis; recall 3 yrs (Dr. Henrene Pastor)  . DEXA  02/2015   T score -2.1 in both femoral necks; FRAX 10 yr risk of major osteoporotic fracture was 21%---fosamax started  . EYE SURGERY    . ORIF RADIAL FRACTURE  11/18/10   left; s/p slip on slippery floor and fell  . TONSILLECTOMY     Social History:  reports that she has never smoked. She has never used smokeless tobacco. She reports that she drinks alcohol. She reports that she does not use drugs.  Allergies  Allergen Reactions  . Augmentin [Amoxicillin-Pot Clavulanate] Nausea And Vomiting    "projectile vomiting" Has patient had a PCN reaction causing immediate rash, facial/tongue/throat swelling, SOB or lightheadedness with hypotension:No Has patient had a PCN reaction causing severe rash involving mucus membranes or skin necrosis:No Has patient had a PCN reaction that required hospitalization:No  Has patient had a PCN reaction occurring within the last 10 years:Yes If all of the above answers are "NO", then may proceed with Cephalosporin use.     Family History  Problem Relation Age of Onset  . Heart disease Mother   . Heart disease Father   . Hypertension Brother   . Colon cancer Neg Hx   . Pancreatic cancer Neg Hx   . Rectal cancer Neg Hx   . Stomach cancer Neg Hx      Prior to Admission medications   Medication Sig Start Date  End Date Taking? Authorizing Provider  alendronate (FOSAMAX) 70 MG tablet Take 1 tablet (70 mg total) by mouth every 7 (seven) days. Take with a full glass of water on an empty stomach. Patient taking differently: Take 70 mg by mouth every Monday. Take with a full glass of water on an empty stomach. 03/06/15  Yes [name], MD  ALPRAZolam Duanne Moron) 0.5 MG tablet take 1 tablet by mouth three times a day if needed for STRESS 12/07/15  Yes [name], MD  amLODipine (NORVASC) 10 MG tablet Take 1 tablet (10 mg total) by mouth daily. Patient taking differently: Take 10 mg by mouth every evening.  04/27/15  Yes [name], MD  Ascorbic Acid (VITAMIN C) 500 MG tablet Take 1,000 mg by mouth every evening.    Yes Historical Provider, MD  aspirin EC 81 MG tablet Take 81 mg by mouth daily.   Yes Historical Provider, MD  atorvastatin (LIPITOR) 40 MG tablet take 1 tablet by mouth once daily Patient taking differently: take 1 tablet by mouth once daily in the evening 07/06/15  Yes [name], MD  B Complex Vitamins (B COMPLEX-B12 PO) Take 1 capsule by mouth every evening.    Yes Historical Provider, MD  calcium-vitamin D (OSCAL WITH D) 500-200 MG-UNIT per tablet Take 1 tablet by mouth every evening.    Yes Historical Provider, MD  carboxymethylcellulose (REFRESH PLUS) 0.5 % SOLN Place 2 drops into both eyes daily.   Yes Historical Provider, MD  Coenzyme Q10 200 MG capsule Take 200 mg by mouth every evening.    Yes Historical Provider, MD  furosemide (LASIX) 40 MG tablet take 1 tablet by mouth twice a day Patient taking differently: take 1 tablet by mouth daily 08/04/15  Yes [name], MD  HYDROcodone-acetaminophen (NORCO/VICODIN) 5-325 MG tablet Take 1 tablet by mouth every 4 (four) hours as needed. Patient taking differently: Take 1 tablet by mouth every 4 (four) hours as needed (for pain.).  02/07/16  Yes Dorie Rank, MD  losartan (COZAAR) 50 MG tablet take 1 tablet by mouth once daily  07/06/15  Yes [name], MD  Menthol, Topical Analgesic, (BLUE-EMU MAXIMUM STRENGTH EX) Apply 1 application topically 3 (three) times daily as needed (for knee pain/joint pain.).   Yes Historical Provider, MD  Multiple Vitamins-Minerals (CENTRUM SILVER ULTRA WOMENS) TABS Take 1 tablet by mouth every evening.    Yes Historical Provider, MD  Omega-3 Fatty Acids (FISH OIL) 1200 MG CAPS Take 1,200 mg by mouth every evening.    Yes Historical Provider, MD  sodium chloride (OCEAN) 0.65 % nasal spray Place 1 spray into the nose daily. After shower   Yes Historical Provider, MD  acetaminophen (TYLENOL) 325 MG tablet Take 650 mg by mouth every 6 (six) hours as needed for moderate pain.     Historical Provider, MD  docusate sodium (COLACE) 100 MG capsule Take 1  capsule (100 mg total) by mouth 2 (two) times daily. While taking narcotic pain medicine. 02/19/16   Corky Sing, PA-C  oxyCODONE (ROXICODONE) 5 MG immediate release tablet Take 1-2 tablets (5-10 mg total) by mouth every 4 (four) hours as needed for moderate pain or severe pain. 02/19/16   Justin Pike Ollis, PA-C  PROAIR HFA 108 [phone] Base) MCG/ACT inhaler inhale 2 puffs INTO THE LUNGS every 4 hours if needed for wheezing or shortness of breath ( COUGH SHORTNESS OF BREATH AND WHEEZING) 01/07/16   [name], MD  senna (SENOKOT) 8.6 MG TABS tablet Take 2 tablets (17.2 mg total) by mouth 2 (two) times daily. 02/19/16   Corky Sing, PA-C   Physical Exam: Vitals:   02/19/16 0600 02/19/16 0700 02/19/16 0729 02/19/16 0910  BP: (!) 91/57 (!) 93/47 (!) 93/47 113/65  Pulse: 76 (!) 27 (!) 52 81  Resp: (!) 28 (!) 27 (!) 28   Temp:   98 F (36.7 C)   TempSrc:   Oral   SpO2: 94% 100% 100%   Weight:      Height:        Wt Readings from Last 3 Encounters:  02/18/16 74.4 kg (164 lb)  02/17/16 75.3 kg (166 lb)  12/16/15 75.3 kg (166 lb)    General:  Appears calm and comfortable, Alert and oriented 3 Eyes:  PERRL, EOMI, normal lids,  iris ENT:  grossly normal hearing, lips & tongue Neck:  no LAD, masses or thyromegaly, No anterior neck tenderness Cardiovascular:  Regular rate and regular rhythm, no m/r/g. No LE edema.  Respiratory:  CTA bilaterally, no w/r/r. Normal respiratory effort. 2 L nasal cannula Abdomen:  soft, ntnd Skin:  no rash or induration seen on limited exam Musculoskeletal:  grossly normal tone BUE/BLE Psychiatric:  grossly normal mood and affect, speech fluent and appropriate Neurologic:  CN 2-12 grossly intact, moves all extremities in coordinated fashion.          Labs on Admission:  Basic Metabolic Panel:  Recent Labs Lab 02/17/16 1630  NA 140  K 4.2  CL 101  CO2 26  GLUCOSE 124*  BUN 23*  CREATININE 1.18*  CALCIUM 9.7   Liver Function Tests: No results for input(s): AST, ALT, ALKPHOS, BILITOT, PROT, ALBUMIN in the last 168 hours. No results for input(s): LIPASE, AMYLASE in the last 168 hours. No results for input(s): AMMONIA in the last 168 hours. CBC:  Recent Labs Lab 02/17/16 1630  WBC 8.0  HGB 12.9  HCT 40.5  MCV 89.2  PLT 329   Cardiac Enzymes: No results for input(s): CKTOTAL, CKMB, CKMBINDEX, TROPONINI in the last 168 hours.  BNP (last 3 results) No results for input(s): BNP in the last 8760 hours.  ProBNP (last 3 results) No results for input(s): PROBNP in the last 8760 hours.   Serum creatinine: 1.18 mg/dL High 02/17/16 1630 Estimated creatinine clearance: 36.5 mL/min  CBG: No results for input(s): GLUCAP in the last 168 hours.  Radiological Exams on Admission: Dg Chest Port 1 View  Result Date: 02/19/2016 CLINICAL DATA:  Tachycardia. EXAM: PORTABLE CHEST 1 VIEW COMPARISON:  10/17/2014. FINDINGS: Cardiomegaly with normal pulmonary vascularity. Low lung volumes with mild basilar atelectasis and/or scarring again noted. No pleural effusion or pneumothorax . IMPRESSION: 1. Low lung volumes with mild basilar atelectasis and/or scarring again noted. No acute  infiltrate. 2.  Stable cardiomegaly. Electronically Signed   By: Marcello Moores  Register   On: 02/19/2016 09:10  I independently reviewed the chest x-ray and agree with the impression of the radiologist. Findings: Cardiomegaly.   EKG: Independently reviewed. Ventricular rate 108 QRS 86 QTC 444, atrial fibrillation with RVR-no STEMI, when compared to 1/31 EKG no significant ST changes  Assessment/Plan Principal Problem:   Atrial fibrillation, new onset (HCC) Active Problems:   Hyperlipidemia   Anxiety state   HTN (hypertension)   Ankle fracture, left-s/p surgery 02/18/16   Closed displaced trimalleolar fracture of left ankle   Low TSH level   New onset afib Per cardio recs I have ordered 2D ECHO Mgmt per cardiology Checking UA, hgb,  Mg and Phos  Low TSH TSH is in the range of sick euthroid syndrome Checking Free T3 and Free T4 Based on labs may check thyroid U/S  L ankle trimalleolar fracture s/p internal fixation POD #1 Mgmt per ortho Pain controlled, ankle  Hypoxia CXR with acute process Shows atelectasis Crackles in bases compatible with exam Ordered IS Well's score for PE of 3, unlikely Wean O2, if unsuccessful will get CTA  Cr elevated Mild bump above baseline, will monitor, labs in AM  Hyperlipidemia Continue statin  Hypertension When necessary hydralazine 10 mg IV as needed for severe blood pressure Cont norvasc, cozaar  Peripheral edema Cont lasix  Anxiety, active Could be due to hyperthyroid Cont xanax Xanax may be to short  DVT Prophylaxis: Xarelto  Thank you for the opportunity to assist with the care of this patient.  Elwin Mocha, MD Family Medicine [facility] [email or web address] Password [email or web address]

## 2016-02-19 NOTE — Progress Notes (Signed)
DAILY PROGRESS NOTE  Subjective:  No events overnight. Remains in rate-controlled afib, unaware. Monitor showed artifact that was labelled NSVT overnight.   Objective:  Temp:  [97.3 F (36.3 C)-98.2 F (36.8 C)] 98 F (36.7 C) (02/02 0729) Pulse Rate:  [27-143] 52 (02/02 0729) Resp:  [11-29] 28 (02/02 0729) BP: (91-148)/(47-87) 93/47 (02/02 0729) SpO2:  [85 %-100 %] 100 % (02/02 0729) Weight:  [164 lb (74.4 kg)] 164 lb (74.4 kg) (02/01 1238) Weight change:   Intake/Output from previous day: 02/01 0701 - 02/02 0700 In: 1594.1 [I.V.:1594.1] Out: 20 [Blood:20]  Intake/Output from this shift: Total I/O In: 50 [I.V.:50] Out: -   Medications: No current facility-administered medications on file prior to encounter.    Current Outpatient Prescriptions on File Prior to Encounter  Medication Sig Dispense Refill  . alendronate (FOSAMAX) 70 MG tablet Take 1 tablet (70 mg total) by mouth every 7 (seven) days. Take with a full glass of water on an empty stomach. (Patient taking differently: Take 70 mg by mouth every Monday. Take with a full glass of water on an empty stomach.) 4 tablet 11  . ALPRAZolam (XANAX) 0.5 MG tablet take 1 tablet by mouth three times a day if needed for STRESS 90 tablet 5  . amLODipine (NORVASC) 10 MG tablet Take 1 tablet (10 mg total) by mouth daily. (Patient taking differently: Take 10 mg by mouth every evening. ) 90 tablet 3  . Ascorbic Acid (VITAMIN C) 500 MG tablet Take 1,000 mg by mouth every evening.     Marland Kitchen atorvastatin (LIPITOR) 40 MG tablet take 1 tablet by mouth once daily (Patient taking differently: take 1 tablet by mouth once daily in the evening) 90 tablet 3  . B Complex Vitamins (B COMPLEX-B12 PO) Take 1 capsule by mouth every evening.     . calcium-vitamin D (OSCAL WITH D) 500-200 MG-UNIT per tablet Take 1 tablet by mouth every evening.     . carboxymethylcellulose (REFRESH PLUS) 0.5 % SOLN Place 2 drops into both eyes daily.    . Coenzyme Q10  200 MG capsule Take 200 mg by mouth every evening.     . furosemide (LASIX) 40 MG tablet take 1 tablet by mouth twice a day (Patient taking differently: take 1 tablet by mouth daily) 180 tablet 1  . HYDROcodone-acetaminophen (NORCO/VICODIN) 5-325 MG tablet Take 1 tablet by mouth every 4 (four) hours as needed. (Patient taking differently: Take 1 tablet by mouth every 4 (four) hours as needed (for pain.). ) 12 tablet 0  . losartan (COZAAR) 50 MG tablet take 1 tablet by mouth once daily 90 tablet 3  . Multiple Vitamins-Minerals (CENTRUM SILVER ULTRA WOMENS) TABS Take 1 tablet by mouth every evening.     . Omega-3 Fatty Acids (FISH OIL) 1200 MG CAPS Take 1,200 mg by mouth every evening.     . sodium chloride (OCEAN) 0.65 % nasal spray Place 1 spray into the nose daily. After shower    . acetaminophen (TYLENOL) 325 MG tablet Take 650 mg by mouth every 6 (six) hours as needed for moderate pain.     Marland Kitchen PROAIR HFA 108 (90 Base) MCG/ACT inhaler inhale 2 puffs INTO THE LUNGS every 4 hours if needed for wheezing or shortness of breath ( COUGH SHORTNESS OF BREATH AND WHEEZING) 1 Inhaler 1    Physical Exam: General appearance: alert and no distress Lungs: clear to auscultation bilaterally Heart: irregularly irregular rhythm Extremities: extremities normal, atraumatic, no cyanosis or edema  Neurologic: Grossly normal  Lab Results: Results for orders placed or performed during the hospital encounter of 02/18/16 (from the past 48 hour(s))  MRSA PCR Screening     Status: None   Collection Time: 02/18/16  9:11 PM  Result Value Ref Range   MRSA by PCR NEGATIVE NEGATIVE    Comment:        The GeneXpert MRSA Assay (FDA approved for NASAL specimens only), is one component of a comprehensive MRSA colonization surveillance program. It is not intended to diagnose MRSA infection nor to guide or monitor treatment for MRSA infections.   TSH     Status: Abnormal   Collection Time: 02/19/16  4:15 AM  Result  Value Ref Range   TSH 0.271 (L) 0.350 - 4.500 uIU/mL    Comment: Performed by a 3rd Generation assay with a functional sensitivity of <=0.01 uIU/mL.    Imaging: No results found.  Assessment:  1. Active Problems: 2.   Hyperlipidemia 3.   HTN (hypertension) 4.   Atrial fibrillation, new onset ([place]) 5.   Ankle fracture, left-s/p surgery 02/18/16 6.   Closed displaced trimalleolar fracture of left ankle 7.   Plan:  1. A-fib rate controlled overnight. On metoprolol and diltiazem. Will wean off dilt gtts after am metoprolol dose. Found to be hyperthyroid overnight. Work-up per hospital medicine. This is likely contributing to a-fib. Plan will be for 3 weeks of anticoagulation which should allow time to normalize thyroid function and consider outpatient DCCV at that time. Start Xarelto today and d/c lovenox, per pharmacy.  Time Spent Directly with Patient:  15 minutes  Length of Stay:  LOS: 1 day   [name], MD, [facility] Attending Cardiologist [place] 02/19/2016, 8:44 AM

## 2016-02-19 NOTE — Progress Notes (Signed)
  Echocardiogram 2D Echocardiogram has been performed.  Brenda Matthews 02/19/2016, 5:37 PM

## 2016-02-19 NOTE — Evaluation (Signed)
Physical Therapy Evaluation Patient Details Name: Brenda Matthews MRN: [id number] DOB: [birth year] Today's Date: 02/19/2016   History of Present Illness  This 81 y.o. admitted for ORIF of Lt ankle trimal fx sustained when she fell on ice 1.5 weeks PTA.  Post op course complicated by onset of A-Fib with RVR.  PMH includes:  anxiety,  radial shaft fx, osteopenia  Clinical Impression  Pt admitted with above diagnosis and presents to PT with functional limitations due to deficits listed below (See PT problem list). Pt needs skilled PT to maximize independence and safety to allow discharge to ST-SNF. Pt currently unable to mobilize adequately while maintaining her weight bearing status to return home alone. Expect she will make good progress at SNF.      Follow Up Recommendations SNF    Equipment Recommendations  None recommended by PT    Recommendations for Other Services       Precautions / Restrictions Precautions Precautions: Fall Restrictions Weight Bearing Restrictions: Yes LLE Weight Bearing: Non weight bearing      Mobility  Bed Mobility Overal bed mobility: Needs Assistance Bed Mobility: Supine to Sit     Supine to sit: Supervision;HOB elevated     General bed mobility comments: Incr time  Transfers Overall transfer level: Needs assistance Equipment used: Rolling walker (2 wheeled) Transfers: Sit to/from [facility] Sit to Stand: Min guard;Min assist Stand pivot transfers: Min guard       General transfer comment: Pt able to safely perform stand pivot transfers with assist for balance and safety while maintaining NWB status.  Sit to stand with walker with min A to bring hips up and for balance and pt with difficulty maintaining NWB.  Ambulation/Gait             General Gait Details: Unable to attempt due to pt with difficulty maintaining NWB with walker and standing.  Stairs            Wheelchair Mobility    Modified Rankin (Stroke  Patients Only)       Balance Overall balance assessment: Needs assistance Sitting-balance support: Feet supported Sitting balance-Leahy Scale: Good     Standing balance support: Bilateral upper extremity supported Standing balance-Leahy Scale: Poor Standing balance comment: reliant on bil. UE support                              Pertinent Vitals/Pain Pain Assessment: Faces Faces Pain Scale: Hurts a little bit Pain Location: Lt LE  Pain Descriptors / Indicators: Operative site guarding Pain Intervention(s): Monitored during session    Home Living Family/patient expects to be discharged to:: Skilled nursing facility Living Arrangements: Alone   Type of Home: House       Home Layout: Two level Home Equipment: Environmental consultant - 2 wheels;Other (comment) (transport w/c ) Additional Comments: Pt's son and daughter in law have been assisting her since fall or she has had hired caregivers.   SHe has been sponge bathing     Prior Function Level of Independence: Needs assistance   Gait / Transfers Assistance Needed: Prior to fall, pt was independent without AD.   Since fall, Pt has been performing stand pivot transfers only with assist.   ADL's / Homemaking Assistance Needed: Prior to fall, pt was independent.  Since fall, she has required assist with ADLs         Hand Dominance   Dominant Hand: Right    Extremity/Trunk  Assessment   Upper Extremity Assessment Upper Extremity Assessment: Defer to OT evaluation    Lower Extremity Assessment Lower Extremity Assessment: LLE deficits/detail LLE Deficits / Details: Hip and knee WFL but ankle limited due to splint LLE: Unable to fully assess due to immobilization    Cervical / Trunk Assessment Cervical / Trunk Assessment: Kyphotic  Communication   Communication: No difficulties  Cognition Arousal/Alertness: Awake/alert Behavior During Therapy: WFL for tasks assessed/performed Overall Cognitive Status: Within  Functional Limits for tasks assessed                      General Comments      Exercises Other Exercises Other Exercises: Pt instructed in chair push ups    Assessment/Plan    PT Assessment Patient needs continued PT services  PT Problem List Decreased balance;Decreased mobility;Decreased knowledge of use of DME;Decreased knowledge of precautions          PT Treatment Interventions DME instruction;Gait training;Functional mobility training;Therapeutic activities;Therapeutic exercise;Balance training;Patient/family education;Wheelchair mobility training    PT Goals (Current goals can be found in the Care Plan section)  Acute Rehab PT Goals Patient Stated Goal: to get stronger and regain independence  PT Goal Formulation: With patient Time For Goal Achievement: 03/04/16 Potential to Achieve Goals: Good    Frequency Min 3X/week   Barriers to discharge Decreased caregiver support Lives alone    Co-evaluation PT/OT/SLP Co-Evaluation/Treatment: Yes Reason for Co-Treatment: For patient/therapist safety PT goals addressed during session: Mobility/safety with mobility         End of Session   Activity Tolerance: Patient tolerated treatment well Patient left: in chair;with call bell/phone within reach (pt being transferred to another unit) Nurse Communication: Mobility status;Weight bearing status         Time: 1400-1419 PT Time Calculation (min) (ACUTE ONLY): 19 min   Charges:   PT Evaluation $PT Eval Low Complexity: 1 Procedure     PT G CodesShary Decamp [facility] 25-Feb-2016, 4:50 PM [employer] PT [phone]

## 2016-02-20 DIAGNOSIS — I4891 Unspecified atrial fibrillation: Secondary | ICD-10-CM

## 2016-02-20 DIAGNOSIS — S82852A Displaced trimalleolar fracture of left lower leg, initial encounter for closed fracture: Principal | ICD-10-CM

## 2016-02-20 DIAGNOSIS — F411 Generalized anxiety disorder: Secondary | ICD-10-CM

## 2016-02-20 DIAGNOSIS — R946 Abnormal results of thyroid function studies: Secondary | ICD-10-CM

## 2016-02-20 DIAGNOSIS — I10 Essential (primary) hypertension: Secondary | ICD-10-CM

## 2016-02-20 DIAGNOSIS — E782 Mixed hyperlipidemia: Secondary | ICD-10-CM

## 2016-02-20 DIAGNOSIS — N179 Acute kidney failure, unspecified: Secondary | ICD-10-CM

## 2016-02-20 LAB — BASIC METABOLIC PANEL
ANION GAP: 8 (ref 5–15)
BUN: 30 mg/dL — ABNORMAL HIGH (ref 6–20)
CALCIUM: 9.3 mg/dL (ref 8.9–10.3)
CO2: 27 mmol/L (ref 22–32)
Chloride: 102 mmol/L (ref 101–111)
Creatinine, Ser: 1.55 mg/dL — ABNORMAL HIGH (ref 0.44–1.00)
GFR, EST AFRICAN AMERICAN: 36 mL/min — AB (ref >60)
GFR, EST NON AFRICAN AMERICAN: 31 mL/min — AB (ref >60)
GLUCOSE: 188 mg/dL — AB (ref 65–99)
POTASSIUM: 3.9 mmol/L (ref 3.5–5.1)
SODIUM: 137 mmol/L (ref 135–145)

## 2016-02-20 LAB — CBC WITH DIFFERENTIAL/PLATELET
BASOS ABS: 0 10*3/uL (ref 0.0–0.1)
BASOS PCT: 0 %
EOS ABS: 0.4 10*3/uL (ref 0.0–0.7)
EOS PCT: 4 %
HCT: 37.2 % (ref 36.0–46.0)
Hemoglobin: 11.6 g/dL — ABNORMAL LOW (ref 12.0–15.0)
Lymphocytes Relative: 24 %
Lymphs Abs: 2.5 10*3/uL (ref 0.7–4.0)
MCH: 28 pg (ref 26.0–34.0)
MCHC: 31.2 g/dL (ref 30.0–36.0)
MCV: 89.6 fL (ref 78.0–100.0)
MONO ABS: 0.5 10*3/uL (ref 0.1–1.0)
Monocytes Relative: 4 %
Neutro Abs: 7.1 10*3/uL (ref 1.7–7.7)
Neutrophils Relative %: 68 %
PLATELETS: 304 10*3/uL (ref 150–400)
RBC: 4.15 MIL/uL (ref 3.87–5.11)
RDW: 13.6 % (ref 11.5–15.5)
WBC: 10.4 10*3/uL (ref 4.0–10.5)

## 2016-02-20 LAB — T3, FREE: T3 FREE: 2.8 pg/mL (ref 2.0–4.4)

## 2016-02-20 MED ORDER — RIVAROXABAN 15 MG PO TABS: 15.0000 mg | ORAL_TABLET | Freq: Every day | ORAL | 0 refills | Status: DC

## 2016-02-20 MED ORDER — AMLODIPINE BESYLATE 5 MG PO TABS
5.0000 mg | ORAL_TABLET | Freq: Every day | ORAL | Status: DC
  Administered 2016-02-20: 5 mg via ORAL

## 2016-02-20 MED ORDER — RIVAROXABAN 15 MG PO TABS: 15.0000 mg | ORAL_TABLET | Freq: Every day | ORAL | Status: DC

## 2016-02-20 NOTE — NC FL2 (Signed)
[place] LEVEL OF CARE SCREENING TOOL     IDENTIFICATION  Patient Name: Brenda Matthews Birthdate: 08/03/1935 Sex: female Admission Date (Current Location): 02/18/2016  [facility] and [place] Number:  Herbalist and Address:  [facility] [place], [place] [address], [place], [place] [address]      Provider Number: [id number]  Attending Physician Name and Address:  Wylene Simmer, MD  Relative Name and Phone Number:       Current Level of Care: Hospital Recommended Level of Care: [place] Prior Approval Number:    Date Approved/Denied:   PASRR Number: [id number]   Discharge Plan: SNF    Current Diagnoses: Patient Active Problem List   Diagnosis Date Noted  . Low TSH level 02/19/2016  . Atrial fibrillation, new onset ([place]) 02/18/2016  . Ankle fracture, left-s/p surgery 02/18/16 02/18/2016  . Closed displaced trimalleolar fracture of left ankle 02/18/2016  . Acute bronchitis 02/10/2015  . Bronchopneumonia 12/02/2013  . Chronic renal insufficiency, stage II (mild) 12/04/2012  . Hx of adenomatous colonic polyps   . Routine general medical examination at a health care facility 09/05/2011  . Cervical cancer screening 02/24/2011  . SCIATICA, BILATERAL 09/01/2009  . SACROILIAC STRAIN, ACUTE 09/01/2009  . ANEMIA 11/07/2007  . Osteopenia of the elderly 11/07/2007  . INSOMNIA 11/07/2007  . EDEMA 08/14/2007  . Hyperlipidemia 04/04/2007  . PANIC DISORDER 04/04/2007  . HTN (hypertension) 04/04/2007  . Anxiety state 12/27/2006  . ARTHRITIS 12/27/2006    Orientation RESPIRATION BLADDER Height & Weight     Self, Situation, Place, Time  Normal Continent Weight: 164 lb (74.4 kg) Height:  5' 2"  (157.5 cm)  BEHAVIORAL SYMPTOMS/MOOD NEUROLOGICAL BOWEL NUTRITION STATUS      Continent  (Please see discharge summary)  AMBULATORY STATUS COMMUNICATION OF NEEDS Skin   Extensive Assist (Pt non weight barring for 6 weeks) Verbally Surgical  wounds (Closed incision right ankle)                       Personal Care Assistance Level of Assistance  Feeding, Dressing, Bathing Bathing Assistance: Limited assistance Feeding assistance: Independent Dressing Assistance: Limited assistance     Functional Limitations Info  Sight, Hearing, Speech Sight Info: Adequate Hearing Info: Adequate Speech Info: Adequate    SPECIAL CARE FACTORS FREQUENCY  PT (By licensed PT), OT (By licensed OT)     PT Frequency: 3x week OT Frequency: 3x week            Contractures Contractures Info: Not present    Additional Factors Info  Code Status, Allergies Code Status Info: Full Code Allergies Info: Augmentin Amoxicillin-pot Clavulanate           Current Medications (02/20/2016):  This is the current hospital active medication list Current Facility-Administered Medications  Medication Dose Route Frequency Provider Last Rate Last Dose  . acetaminophen (TYLENOL) tablet 650 mg  650 mg Oral Q6H PRN Wylene Simmer, MD   650 mg at 02/20/16 0003   Or  . acetaminophen (TYLENOL) suppository 650 mg  650 mg Rectal Q6H PRN Wylene Simmer, MD      . albuterol (PROVENTIL) (2.5 MG/3ML) 0.083% nebulizer solution 2.5 mg  2.5 mg Nebulization Q4H PRN Wylene Simmer, MD      . ALPRAZolam Duanne Moron) tablet 0.5 mg  0.5 mg Oral TID PRN Nicholes Stairs, MD   0.5 mg at 02/19/16 0973  . amLODipine (NORVASC) tablet 5 mg  5 mg Oral  Daily [name], MD   5 mg at 02/20/16 2993  . aspirin EC tablet 81 mg  81 mg Oral BID Wylene Simmer, MD   81 mg at 02/20/16 0830  . atorvastatin (LIPITOR) tablet 40 mg  40 mg Oral QPM Wylene Simmer, MD   40 mg at 02/19/16 1721  . bisacodyl (DULCOLAX) suppository 10 mg  10 mg Rectal Daily PRN Wylene Simmer, MD      . calcium-vitamin D (OSCAL WITH D) 500-200 MG-UNIT per tablet 1 tablet  1 tablet Oral QPM Wylene Simmer, MD   1 tablet at 02/19/16 1721  . celecoxib (CELEBREX) capsule 200 mg  200 mg Oral Q12H Wylene Simmer, MD   200 mg at 02/20/16  0829  . dextrose 5 % and 0.45 % NaCl with KCl 20 mEq/L infusion   Intravenous Continuous Wylene Simmer, MD   Stopped at 02/19/16 1025  . docusate sodium (COLACE) capsule 100 mg  100 mg Oral BID Wylene Simmer, MD   100 mg at 02/20/16 7169  . furosemide (LASIX) tablet 40 mg  40 mg Oral Daily Wylene Simmer, MD   40 mg at 02/20/16 0827  . hydrALAZINE (APRESOLINE) injection 10 mg  10 mg Intravenous Q8H PRN Elwin Mocha, MD      . losartan (COZAAR) tablet 50 mg  50 mg Oral Daily Wylene Simmer, MD   50 mg at 02/20/16 0830  . magnesium citrate solution 1 Bottle  1 Bottle Oral Once PRN Wylene Simmer, MD      . methocarbamol (ROBAXIN) tablet 500 mg  500 mg Oral Q6H PRN Wylene Simmer, MD   500 mg at 02/20/16 6789   Or  . methocarbamol (ROBAXIN) 500 mg in dextrose 5 % 50 mL IVPB  500 mg Intravenous Q6H PRN Wylene Simmer, MD      . metoprolol tartrate (LOPRESSOR) tablet 25 mg  25 mg Oral BID Erlene Quan, PA-C   25 mg at 02/20/16 3810  . morphine 2 MG/ML injection 2 mg  2 mg Intravenous Q2H PRN Wylene Simmer, MD      . multivitamin with minerals tablet 1 tablet  1 tablet Oral Daily Wylene Simmer, MD   1 tablet at 02/20/16 [phone]  . omega-3 acid ethyl esters (LOVAZA) capsule 1 g  1 g Oral Daily Wylene Simmer, MD   1 g at 02/20/16 0827  . ondansetron (ZOFRAN) tablet 4 mg  4 mg Oral Q6H PRN Wylene Simmer, MD       Or  . ondansetron [facility]) injection 4 mg  4 mg Intravenous Q6H PRN Wylene Simmer, MD      . oxyCODONE (Oxy IR/ROXICODONE) immediate release tablet 5-10 mg  5-10 mg Oral Q4H PRN Wylene Simmer, MD   10 mg at 02/20/16 0258  . polyethylene glycol (MIRALAX / GLYCOLAX) packet 17 g  17 g Oral Daily PRN Wylene Simmer, MD      . polyvinyl alcohol (LIQUIFILM TEARS) 1.4 % ophthalmic solution 2 drop  2 drop Both Eyes Daily Wylene Simmer, MD   2 drop at 02/19/16 1100  . [START ON 02/21/2016] Rivaroxaban (XARELTO) tablet 15 mg  15 mg Oral Q supper Reginia Naas, RPH      . senna (SENOKOT) tablet 8.6 mg  1 tablet Oral BID Wylene Simmer, MD    8.6 mg at 02/20/16 0827  . sodium chloride (OCEAN) 0.65 % nasal spray 1 spray  1 spray Each Nare PRN Wylene Simmer, MD   1 spray at 02/18/16  2230  . vitamin B-12 (CYANOCOBALAMIN) tablet 1,000 mcg  1,000 mcg Oral Daily Wylene Simmer, MD   1,000 mcg at 02/20/16 0830  . vitamin C (ASCORBIC ACID) tablet 1,000 mg  1,000 mg Oral QPM Wylene Simmer, MD   1,000 mg at 02/19/16 1721     Discharge Medications: Please see discharge summary for a list of discharge medications.  Relevant Imaging Results:  Relevant Lab Results:   Additional Information SSN: [id number]  Alla German, LCSW

## 2016-02-20 NOTE — Progress Notes (Signed)
DAILY PROGRESS NOTE  Subjective:  Converted back to sinus rhythm overnight. Echo yesterday shows normal LVEF of 55-60%, mild AI and normal biatrial size.  Objective:  Temp:  [97.6 F (36.4 C)-98.2 F (36.8 C)] 97.9 F (36.6 C) (02/03 0513) Pulse Rate:  [57-81] 64 (02/03 0513) Resp:  [15-22] 16 (02/03 0513) BP: (98-113)/(52-65) 111/54 (02/03 0513) SpO2:  [91 %-95 %] 91 % (02/03 0513) Weight change:   Intake/Output from previous day: 02/02 0701 - 02/03 0700 In: 710 [P.O.:660; I.V.:50] Out: 450 [Urine:450]  Intake/Output from this shift: No intake/output data recorded.  Medications: No current facility-administered medications on file prior to encounter.    Current Outpatient Prescriptions on File Prior to Encounter  Medication Sig Dispense Refill  . alendronate (FOSAMAX) 70 MG tablet Take 1 tablet (70 mg total) by mouth every 7 (seven) days. Take with a full glass of water on an empty stomach. (Patient taking differently: Take 70 mg by mouth every Monday. Take with a full glass of water on an empty stomach.) 4 tablet 11  . ALPRAZolam (XANAX) 0.5 MG tablet take 1 tablet by mouth three times a day if needed for STRESS 90 tablet 5  . amLODipine (NORVASC) 10 MG tablet Take 1 tablet (10 mg total) by mouth daily. (Patient taking differently: Take 10 mg by mouth every evening. ) 90 tablet 3  . Ascorbic Acid (VITAMIN C) 500 MG tablet Take 1,000 mg by mouth every evening.     Marland Kitchen atorvastatin (LIPITOR) 40 MG tablet take 1 tablet by mouth once daily (Patient taking differently: take 1 tablet by mouth once daily in the evening) 90 tablet 3  . B Complex Vitamins (B COMPLEX-B12 PO) Take 1 capsule by mouth every evening.     . calcium-vitamin D (OSCAL WITH D) 500-200 MG-UNIT per tablet Take 1 tablet by mouth every evening.     . carboxymethylcellulose (REFRESH PLUS) 0.5 % SOLN Place 2 drops into both eyes daily.    . Coenzyme Q10 200 MG capsule Take 200 mg by mouth every evening.     .  furosemide (LASIX) 40 MG tablet take 1 tablet by mouth twice a day (Patient taking differently: take 1 tablet by mouth daily) 180 tablet 1  . HYDROcodone-acetaminophen (NORCO/VICODIN) 5-325 MG tablet Take 1 tablet by mouth every 4 (four) hours as needed. (Patient taking differently: Take 1 tablet by mouth every 4 (four) hours as needed (for pain.). ) 12 tablet 0  . losartan (COZAAR) 50 MG tablet take 1 tablet by mouth once daily 90 tablet 3  . Multiple Vitamins-Minerals (CENTRUM SILVER ULTRA WOMENS) TABS Take 1 tablet by mouth every evening.     . Omega-3 Fatty Acids (FISH OIL) 1200 MG CAPS Take 1,200 mg by mouth every evening.     . sodium chloride (OCEAN) 0.65 % nasal spray Place 1 spray into the nose daily. After shower    . acetaminophen (TYLENOL) 325 MG tablet Take 650 mg by mouth every 6 (six) hours as needed for moderate pain.     Marland Kitchen PROAIR HFA 108 (90 Base) MCG/ACT inhaler inhale 2 puffs INTO THE LUNGS every 4 hours if needed for wheezing or shortness of breath ( COUGH SHORTNESS OF BREATH AND WHEEZING) 1 Inhaler 1    Physical Exam: General appearance: alert and no distress Lungs: clear to auscultation bilaterally Heart: regular rate and rhythm and diastolic murmur: early diastolic 2/6, blowing at lower left sternal border Extremities: extremities normal, atraumatic, no cyanosis or edema  Neurologic: Grossly normal  Lab Results: Results for orders placed or performed during the hospital encounter of 02/18/16 (from the past 48 hour(s))  MRSA PCR Screening     Status: None   Collection Time: 02/18/16  9:11 PM  Result Value Ref Range   MRSA by PCR NEGATIVE NEGATIVE    Comment:        The GeneXpert MRSA Assay (FDA approved for NASAL specimens only), is one component of a comprehensive MRSA colonization surveillance program. It is not intended to diagnose MRSA infection nor to guide or monitor treatment for MRSA infections.   TSH     Status: Abnormal   Collection Time: 02/19/16   4:15 AM  Result Value Ref Range   TSH 0.271 (L) 0.350 - 4.500 uIU/mL    Comment: Performed by a 3rd Generation assay with a functional sensitivity of <=0.01 uIU/mL.  Comprehensive metabolic panel     Status: Abnormal   Collection Time: 02/19/16  9:01 AM  Result Value Ref Range   Sodium 141 135 - 145 mmol/L   Potassium 4.3 3.5 - 5.1 mmol/L   Chloride 106 101 - 111 mmol/L   CO2 25 22 - 32 mmol/L   Glucose, Bld 127 (H) 65 - 99 mg/dL   BUN 17 6 - 20 mg/dL   Creatinine, Ser 0.95 0.44 - 1.00 mg/dL   Calcium 9.2 8.9 - 10.3 mg/dL   Total Protein 6.6 6.5 - 8.1 g/dL   Albumin 3.2 (L) 3.5 - 5.0 g/dL   AST 21 15 - 41 U/L   ALT 16 14 - 54 U/L   Alkaline Phosphatase 83 38 - 126 U/L   Total Bilirubin 0.8 0.3 - 1.2 mg/dL   GFR calc non Af Amer 55 (L) >60 mL/min   GFR calc Af Amer >60 >60 mL/min    Comment: (NOTE) The eGFR has been calculated using the CKD EPI equation. This calculation has not been validated in all clinical situations. eGFR's persistently <60 mL/min signify possible Chronic Kidney Disease.    Anion gap 10 5 - 15  CBC with Differential/Platelet     Status: Abnormal   Collection Time: 02/19/16  9:01 AM  Result Value Ref Range   WBC 11.1 (H) 4.0 - 10.5 K/uL   RBC 4.43 3.87 - 5.11 MIL/uL   Hemoglobin 12.6 12.0 - 15.0 g/dL   HCT 39.9 36.0 - 46.0 %   MCV 90.1 78.0 - 100.0 fL   MCH 28.4 26.0 - 34.0 pg   MCHC 31.6 30.0 - 36.0 g/dL   RDW 13.8 11.5 - 15.5 %   Platelets 302 150 - 400 K/uL   Neutrophils Relative % 79 %   Neutro Abs 8.7 (H) 1.7 - 7.7 K/uL   Lymphocytes Relative 13 %   Lymphs Abs 1.5 0.7 - 4.0 K/uL   Monocytes Relative 7 %   Monocytes Absolute 0.8 0.1 - 1.0 K/uL   Eosinophils Relative 1 %   Eosinophils Absolute 0.1 0.0 - 0.7 K/uL   Basophils Relative 0 %   Basophils Absolute 0.0 0.0 - 0.1 K/uL  T4, free     Status: Abnormal   Collection Time: 02/19/16  9:01 AM  Result Value Ref Range   Free T4 1.99 (H) 0.61 - 1.12 ng/dL    Comment: (NOTE) Biotin ingestion  may interfere with free T4 tests. If the results are inconsistent with the TSH level, previous test results, or the clinical presentation, then consider biotin interference. If needed, order repeat testing after  stopping biotin.   Troponin I     Status: None   Collection Time: 02/19/16  9:01 AM  Result Value Ref Range   Troponin I <0.03 <0.03 ng/mL  Magnesium     Status: None   Collection Time: 02/19/16  9:01 AM  Result Value Ref Range   Magnesium 2.1 1.7 - 2.4 mg/dL  Phosphorus     Status: None   Collection Time: 02/19/16  9:01 AM  Result Value Ref Range   Phosphorus 3.0 2.5 - 4.6 mg/dL  T3, free     Status: None   Collection Time: 02/19/16  9:01 AM  Result Value Ref Range   T3, Free 2.8 2.0 - 4.4 pg/mL    Comment: (NOTE) Performed At: [facility] [place], [place] [id number] Lindon Romp MD UR:[id number]   Urinalysis, Routine w reflex microscopic     Status: Abnormal   Collection Time: 02/19/16  2:08 PM  Result Value Ref Range   Color, Urine YELLOW YELLOW   APPearance HAZY (A) CLEAR   Specific Gravity, Urine 1.014 1.005 - 1.030   pH 5.0 5.0 - 8.0   Glucose, UA NEGATIVE NEGATIVE mg/dL   Hgb urine dipstick NEGATIVE NEGATIVE   Bilirubin Urine NEGATIVE NEGATIVE   Ketones, ur NEGATIVE NEGATIVE mg/dL   Protein, ur NEGATIVE NEGATIVE mg/dL   Nitrite NEGATIVE NEGATIVE   Leukocytes, UA MODERATE (A) NEGATIVE   RBC / HPF 0-5 0 - 5 RBC/hpf   WBC, UA TOO NUMEROUS TO COUNT 0 - 5 WBC/hpf   Bacteria, UA FEW (A) NONE SEEN   Squamous Epithelial / LPF 0-5 (A) NONE SEEN   Mucous PRESENT    Hyaline Casts, UA PRESENT     Imaging: Dg Chest Port 1 View  Result Date: 02/19/2016 CLINICAL DATA:  Tachycardia. EXAM: PORTABLE CHEST 1 VIEW COMPARISON:  10/17/2014. FINDINGS: Cardiomegaly with normal pulmonary vascularity. Low lung volumes with mild basilar atelectasis and/or scarring again noted. No pleural effusion or pneumothorax . IMPRESSION: 1. Low lung volumes with  mild basilar atelectasis and/or scarring again noted. No acute infiltrate. 2.  Stable cardiomegaly. Electronically Signed   By: Marcello Moores  Register   On: 02/19/2016 09:10    Assessment:  Principal Problem:   Atrial fibrillation, new onset ([place]) Active Problems:   Hyperlipidemia   Anxiety state   HTN (hypertension)   Ankle fracture, left-s/p surgery 02/18/16   Closed displaced trimalleolar fracture of left ankle   Low TSH level   Plan:  1. Converted to sinus overnight - HR in the 60's. On metoprolol 25 mg BID for rate control, also amlodipine and losartan for hypertension. BP soft this morning. Decrease amlodipine to 5 mg daily. Continue Xarelto.   No further cardiac suggestions at this time. Happy to see in follow-up 3-4 weeks after discharge. Cardiology will sign-off. Call with questions.  Time Spent Directly with Patient:  15 minutes  Length of Stay:  LOS: 2 days   [name], MD, [facility] Attending Cardiologist [place] 02/20/2016, 8:23 AM

## 2016-02-20 NOTE — Discharge Summary (Signed)
Physician Discharge Summary  Patient ID: Brenda Matthews MRN: [id number] DOB/AGE: 79-Mar-1937 79 y.o.  Admit date: 02/18/2016 Discharge date:  02/20/2016  Procedures:  Procedure(s) (LRB): OPEN REDUCTION INTERNAL FIXATION (ORIF) Right ankle trimalleolar fracture (Left)  Attending Physician:  Dr. Paralee Cancel   Admission Diagnoses:   Left ankle fracture  Discharge Diagnoses:  Principal Problem:   Atrial fibrillation, new onset [facility] [place]) Active Problems:   Hyperlipidemia   Anxiety state   HTN (hypertension)   Ankle fracture, left-s/p surgery 02/18/16   Closed displaced trimalleolar fracture of left ankle   Low TSH level  Past Medical History:  Diagnosis Date  . Abnormal EKG approx 2008   Nuclear stress test neg;   . Anxiety    with panic  . Arthritis   . Cataract    s/p surgery--lens implants  . Chronic renal insufficiency, stage II (mild) 12/04/2012   GFR 60s  . Diverticulosis 2009 colonoscopy  . Fracture of radial shaft, left, closed 11/16/10   fell down flight of stairs  . History of kidney stones   . Hx of adenomatous colonic polyps 2002   surveillance colonoscopy 2009, +polypectomy done-tubular adenoma w/out high grade.  05/2013 tubular adenomas--recall 3 yrs  . Hyperlipidemia   . Hypertension   . Osteopenia    DEXA 08/2010; repeat DEXA 02/2015 worse: fosamax started  . Peripheral edema   . Pneumonia 2015   hx  . Rheumatic fever     HPI:    81 y/o female with left ankle trimal fracture.  She presents now for ORIF of this unstable displaced ankle fracture.  PCP: [name], MD   Discharged Condition: good  Hospital Course:  Patient underwent the above stated procedure on 02/18/2016. Patient tolerated the procedure well and brought to the recovery room in good condition and subsequently to the floor.  POD #1 BP: 91/57 ; Pulse: 76 ; Temp: 98 F (36.7 C) ; Resp: 28 Patient reports pain as mild to moderate.  Denies fever, chills, N/V, CP, SOB.  Reports that she  experiences intermittent episodes of anxiety.  Denies hx of thryoid pathology.  Reports that she is trying to eat a little, but doesn't have much of an appetite.  Denies BM. WDWN patient in NAD.  Appropriate mood and affect.  A&O x 3, Moving all extremities, sensation intact to light touch.  Even non-labored respirations.  Dressing/posterior splint C/D/I, no rashes or lesions.  Extremities warm/dry, no visible edema, no erythema or echymosis.  No lymphadenopathy.  Pulses: Popliteus 2+.  MSK:  ROM: EHL/FHL intact, MMT: patient is able to perform quad set.  LABS  Basename    HGB     12.9  HCT     39.9   POD #2  BP: 112/49 ; Pulse: 60 ; Temp: 98.1 F (36.7 C) ; Resp: 16 Patient reports pain as mild.  No events.  Social work has been able to make arrangements for discharge to SNF today.  Patient ready. Neurovascular intact and dressing C/D/I, left leg splint.  LABS  Basename    HGB     11.6  HCT     37.2    Discharge Exam: General appearance: alert, cooperative and no distress Extremities: Homans sign is negative, no sign of DVT, no edema, redness or tenderness in the calves or thighs and no ulcers, gangrene or trophic changes  Disposition:    Skilled nursing facility with follow up in 2 weeks    Contact information for follow-up providers  Wylene Simmer, MD. Schedule an appointment as soon as possible for a visit in 2 week(s).   Specialty:  Orthopedic Surgery Contact information: [address] [place] [address] [phone]            Contact information for after-discharge care    Destination    [facility] Follow up.   Specialty:  [place] information: [place] [phone]                    Allergies as of 02/20/2016      Reactions   Augmentin [amoxicillin-pot Clavulanate] Nausea And Vomiting   "projectile vomiting" Has patient had a PCN  reaction causing immediate rash, facial/tongue/throat swelling, SOB or lightheadedness with hypotension:No Has patient had a PCN reaction causing severe rash involving mucus membranes or skin necrosis:No Has patient had a PCN reaction that required hospitalization:No Has patient had a PCN reaction occurring within the last 10 years:Yes If all of the above answers are "NO", then may proceed with Cephalosporin use.      Medication List    STOP taking these medications   aspirin EC 81 MG tablet   HYDROcodone-acetaminophen 5-325 MG tablet Commonly known as:  NORCO/VICODIN     TAKE these medications   acetaminophen 325 MG tablet Commonly known as:  TYLENOL Take 650 mg by mouth every 6 (six) hours as needed for moderate pain.   alendronate 70 MG tablet Commonly known as:  FOSAMAX Take 1 tablet (70 mg total) by mouth every 7 (seven) days. Take with a full glass of water on an empty stomach. What changed:  when to take this  additional instructions   ALPRAZolam 0.5 MG tablet Commonly known as:  XANAX take 1 tablet by mouth three times a day if needed for STRESS   amLODipine 10 MG tablet Commonly known as:  NORVASC Take 1 tablet (10 mg total) by mouth daily. What changed:  when to take this   atorvastatin 40 MG tablet Commonly known as:  LIPITOR take 1 tablet by mouth once daily What changed:  See the new instructions.   B COMPLEX-B12 PO Take 1 capsule by mouth every evening.   BLUE-EMU MAXIMUM STRENGTH EX Apply 1 application topically 3 (three) times daily as needed (for knee pain/joint pain.).   calcium-vitamin D 500-200 MG-UNIT tablet Commonly known as:  OSCAL WITH D Take 1 tablet by mouth every evening.   carboxymethylcellulose 0.5 % Soln Commonly known as:  REFRESH PLUS Place 2 drops into both eyes daily.   CENTRUM SILVER ULTRA WOMENS Tabs Take 1 tablet by mouth every evening.   Coenzyme Q10 200 MG capsule Take 200 mg by mouth every evening.   docusate  sodium 100 MG capsule Commonly known as:  COLACE Take 1 capsule (100 mg total) by mouth 2 (two) times daily. While taking narcotic pain medicine.   Fish Oil 1200 MG Caps Take 1,200 mg by mouth every evening.   furosemide 40 MG tablet Commonly known as:  LASIX take 1 tablet by mouth twice a day What changed:  See the new instructions.   losartan 50 MG tablet Commonly known as:  COZAAR take 1 tablet by mouth once daily   oxyCODONE 5 MG immediate release tablet Commonly known as:  ROXICODONE Take 1-2 tablets (5-10 mg total) by mouth every 4 (four) hours as needed for moderate pain or severe pain.   PROAIR HFA 108 (90 Base) MCG/ACT  inhaler Generic drug:  albuterol inhale 2 puffs INTO THE LUNGS every 4 hours if needed for wheezing or shortness of breath ( COUGH SHORTNESS OF BREATH AND WHEEZING)   Rivaroxaban 15 MG Tabs tablet Commonly known as:  XARELTO Take 1 tablet (15 mg total) by mouth daily with supper. Start taking on:  02/21/2016   senna 8.6 MG Tabs tablet Commonly known as:  SENOKOT Take 2 tablets (17.2 mg total) by mouth 2 (two) times daily.   sodium chloride 0.65 % nasal spray Commonly known as:  OCEAN Place 1 spray into the nose daily. After shower   vitamin C 500 MG tablet Commonly known as:  ASCORBIC ACID Take 1,000 mg by mouth every evening.        Signed: [place]. Eri Mcevers   PA-C  02/20/2016, 3:20 PM

## 2016-02-20 NOTE — Progress Notes (Signed)
PROGRESS NOTE    Brenda Matthews  PYP:[id number] DOB: 1935-10-15 DOA: 02/18/2016 PCP: [name], MD   Brief Narrative:  Brenda Matthews is a 81 y.o. female  with past medical history significant for anxiety, CKD2, hypertension, hyperlipidemia, peripheral edema, rheumatic fever, abnormal EKG with negative stress test as a consult from orthopedics for abnormal lab value. Patient states that she fractured her L ankle roughly a week ago. Came up to the hospital for preop on January 31 and there were no issues. Patient had her surgery on February 1. When patient was in postop she was told that she went into atrial fibrillation with RVR. Cardiology was then consulted. Patient states that during the accident where she broke her ankle she sustained no chest trauma. She denies being ill in the time prior to coming to the hospital. Patient states she does not have a history of heart disease or name arrhythmia. She states that one time a doctor told her her heartbeat was regular irregular. Patient denies any recent medication changes. Patient states she does not have a family history of heart attack or arrhythmia. TRH was consulted for Workup of low TSH.Overnight spontaneously converted back to NSR, however developed AKI.   Assessment & Plan:   Principal Problem:   Atrial fibrillation, new onset ([place]) Active Problems:   Hyperlipidemia   Anxiety state   HTN (hypertension)   Ankle fracture, left-s/p surgery 02/18/16   Closed displaced trimalleolar fracture of left ankle   Low TSH level  New Onset Atrial Fibrillation -ECHOCardiogram done and showed EF pf 55-60% with elevated PA artery pressure -Mgmt per Cardiology -On Metoprolol 25 mg po BID for Rate Control  Low TSH -TSH is in the range of sick euthroid syndrome at 0.271 -Free T3 was within normal range at 2.8 and Free T4 was 1.99 and elevated -Would recommend re-checking as an outpatient in 4-6 weeks  -If still abnormal then suggest obtain Thyroid  Ultrasound at that time  L ankle trimalleolar fracture s/p internal fixation -POD #2 -Mgmt per Ortho -Pain control per Ortho   Hypoxia -CXR with acute process but showed low lung volumes with mild basilar atelectasis and/or scarring -Crackles in bases compatible with exam -C/w Incentive Spirometry -Well's score for PE of 3, unlikely -Wean O2, if unsuccessful will get CTA  AKI -BUN/Cr went from 17/0.95 -> 30/1.55; Upon further review it appears Baseline Cr is around 0.9 -Likely in the setting of Diuresis and New Onset Atrial Fib and Lower BP's -Would Avoid Nephrotoxics and Hold Losartan, Furosemide, and Celecoxib for now -Would Recommend gentle IVF Rehydration and encourage po Intake -Recommend Rechecking BMP in AM  Hyperlipidemia -Continue Atorvastatin 40 mg po qHS  Hypertension -When necessary hydralazine 10 mg IV q8h as needed for severe blood pressure -Would Continue Norvasc and Metoprolol and Hold Cozaar for now -Continue to Monitor Vital Signs  Peripheral Edema -Hold Lasix and gently rehydrate and re-evaluate  Anxiety, active -Could be due to Hyperthyroid -Continue Xanax 0.5 mg po TIDprn -Xanax may be to short  DVT prophylaxis: Anticoagulated with Rivaroxaban Code Status: FULL CODE Family Communication: No family present at bedside Disposition Plan: Per Primary Orthopedic Team; Attempting to D/C to SNF  Consultants:   Cardiology  [facility]   Procedures:  ECHOCARDIOGRAM Study Conclusions  - Left ventricle: The cavity size was normal. Systolic function was   normal. The estimated ejection fraction was in the range of 55%   to 60%. Wall motion was normal; there were no regional  wall   motion abnormalities. Left ventricular diastolic function   parameters were normal. - Aortic valve: Trileaflet; mildly thickened, mildly calcified   leaflets. There was mild regurgitation. - Mitral valve: There was mild regurgitation. - Pulmonic valve:  There was trivial regurgitation. - Pulmonary arteries: PA peak pressure: 31 mm Hg (s).   Antimicrobials:  Anti-infectives    Start     Dose/Rate Route Frequency Ordered Stop   02/19/16 0600  ceFAZolin (ANCEF) IVPB 2g/100 mL premix     2 g 200 mL/hr over 30 Minutes Intravenous On call to O.R. 02/18/16 1226 02/18/16 1505     Subjective: Seen and examined at bedside and was feeling better. No problems overnight wanted to walk. No concerns or complaints.   Objective: Vitals:   02/19/16 1524 02/19/16 2015 02/20/16 0513 02/20/16 1419  BP: (!) 98/52 (!) 101/53 (!) 111/54 (!) 112/49  Pulse: 64 (!) 57 64 60  Resp: (!) 22 15 16 16   Temp: 98.2 F (36.8 C) 97.7 F (36.5 C) 97.9 F (36.6 C) 98.1 F (36.7 C)  TempSrc: Oral Oral Oral Oral  SpO2: 93% 95% 91% 93%  Weight:      Height:        Intake/Output Summary (Last 24 hours) at 02/20/16 1708 Last data filed at 02/20/16 1300  Gross per 24 hour  Intake              480 ml  Output                0 ml  Net              480 ml   Filed Weights   02/18/16 1238  Weight: 74.4 kg (164 lb)   Examination: Physical Exam:  Constitutional: NAD and appears calm and comfortable Eyes:  Lids and conjunctivae normal, sclerae anicteric  ENMT: External Ears, Nose appear normal. Grossly normal hearing. Neck: Appears normal, supple, no cervical masses, normal ROM, no appreciable thyromegaly Respiratory: Diminished to auscultation bilaterally, no wheezing, rales, rhonchi or crackles. Normal respiratory effort and patient is not tachypenic. No accessory muscle use.  Cardiovascular: RRR, no murmurs / rubs / gallops. S1 and S2 auscultated. Mild edema Abdomen: Soft, non-tender, non-distended. No masses palpated. No appreciable hepatosplenomegaly. Bowel sounds positive.  GU: Deferred. Musculoskeletal: No clubbing / cyanosis of digits/nails. Left lower leg in boot/cast Skin: No rashes, lesions, ulcers on limited skin evaluation. No induration; Warm and  dry.  Neurologic: CN 2-12 grossly intact with no focal deficits. Romberg sign cerebellar reflexes not assessed.  Psychiatric: Normal judgment and insight. Alert and oriented x 3. Normal mood and appropriate affect.   Data Reviewed: I have personally reviewed following labs and imaging studies  CBC:  Recent Labs Lab 02/17/16 1630 02/19/16 0901 02/20/16 0842  WBC 8.0 11.1* 10.4  NEUTROABS  --  8.7* 7.1  HGB 12.9 12.6 11.6*  HCT 40.5 39.9 37.2  MCV 89.2 90.1 89.6  PLT 329 302 161   Basic Metabolic Panel:  Recent Labs Lab 02/17/16 1630 02/19/16 0901 02/20/16 0842  NA 140 141 137  K 4.2 4.3 3.9  CL 101 106 102  CO2 26 25 27   GLUCOSE 124* 127* 188*  BUN 23* 17 30*  CREATININE 1.18* 0.95 1.55*  CALCIUM 9.7 9.2 9.3  MG  --  2.1  --   PHOS  --  3.0  --    GFR: Estimated Creatinine Clearance: 27.8 mL/min (by C-G formula based on SCr of 1.55 mg/dL (H)).  Liver Function Tests:  Recent Labs Lab 02/19/16 0901  AST 21  ALT 16  ALKPHOS 83  BILITOT 0.8  PROT 6.6  ALBUMIN 3.2*   No results for input(s): LIPASE, AMYLASE in the last 168 hours. No results for input(s): AMMONIA in the last 168 hours. Coagulation Profile: No results for input(s): INR, PROTIME in the last 168 hours. Cardiac Enzymes:  Recent Labs Lab 02/19/16 0901  TROPONINI <0.03   BNP (last 3 results) No results for input(s): PROBNP in the last 8760 hours. HbA1C: No results for input(s): HGBA1C in the last 72 hours. CBG: No results for input(s): GLUCAP in the last 168 hours. Lipid Profile: No results for input(s): CHOL, HDL, LDLCALC, TRIG, CHOLHDL, LDLDIRECT in the last 72 hours. Thyroid Function Tests:  Recent Labs  02/19/16 0415 02/19/16 0901  TSH 0.271*  --   FREET4  --  1.99*  T3FREE  --  2.8   Anemia Panel: No results for input(s): VITAMINB12, FOLATE, FERRITIN, TIBC, IRON, RETICCTPCT in the last 72 hours. Sepsis Labs: No results for input(s): PROCALCITON, LATICACIDVEN in the last 168  hours.  Recent Results (from the past 240 hour(s))  MRSA PCR Screening     Status: None   Collection Time: 02/18/16  9:11 PM  Result Value Ref Range Status   MRSA by PCR NEGATIVE NEGATIVE Final    Comment:        The GeneXpert MRSA Assay (FDA approved for NASAL specimens only), is one component of a comprehensive MRSA colonization surveillance program. It is not intended to diagnose MRSA infection nor to guide or monitor treatment for MRSA infections.     Radiology Studies: Dg Chest Port 1 View  Result Date: 02/19/2016 CLINICAL DATA:  Tachycardia. EXAM: PORTABLE CHEST 1 VIEW COMPARISON:  10/17/2014. FINDINGS: Cardiomegaly with normal pulmonary vascularity. Low lung volumes with mild basilar atelectasis and/or scarring again noted. No pleural effusion or pneumothorax . IMPRESSION: 1. Low lung volumes with mild basilar atelectasis and/or scarring again noted. No acute infiltrate. 2.  Stable cardiomegaly. Electronically Signed   By: Marcello Moores  Register   On: 02/19/2016 09:10   Scheduled Meds: . amLODipine  5 mg Oral Daily  . aspirin EC  81 mg Oral BID  . atorvastatin  40 mg Oral QPM  . calcium-vitamin D  1 tablet Oral QPM  . celecoxib  200 mg Oral Q12H  . docusate sodium  100 mg Oral BID  . furosemide  40 mg Oral Daily  . losartan  50 mg Oral Daily  . metoprolol tartrate  25 mg Oral BID  . multivitamin with minerals  1 tablet Oral Daily  . omega-3 acid ethyl esters  1 g Oral Daily  . polyvinyl alcohol  2 drop Both Eyes Daily  . [START ON 02/21/2016] rivaroxaban  15 mg Oral Q supper  . senna  1 tablet Oral BID  . vitamin B-12  1,000 mcg Oral Daily  . vitamin C  1,000 mg Oral QPM   Continuous Infusions: . dextrose 5 % and 0.45 % NaCl with KCl 20 mEq/L Stopped (02/19/16 1025)     LOS: 2 days   Kerney Elbe, DO [facility] Pager [phone]  If 7PM-7AM, please contact night-coverage [email or web address] Password [email or web address] 02/20/2016, 5:08 PM

## 2016-02-20 NOTE — Clinical Social Work Placement (Signed)
   CLINICAL SOCIAL WORK PLACEMENT  NOTE  Date:  02/20/2016  Patient Details  Name: Brenda Matthews MRN: [id number] Date of Birth: February 15, 1935  Clinical Social Work is seeking post-discharge placement for this patient at the [place] level of care (*CSW will initial, date and re-position this form in  chart as items are completed):      Patient/family provided with [facility] Work Department's list of facilities offering this level of care within the geographic area requested by the patient (or if unable, by the patient's family).      Patient/family informed of their freedom to choose among providers that offer the needed level of care, that participate in Medicare, Medicaid or managed care program needed by the patient, have an available bed and are willing to accept the patient.      Patient/family informed of [facility] ownership interest in [facility] and [facility], as well as of the fact that they are under no obligation to receive care at these facilities.  PASRR submitted to EDS on       PASRR number received on 02/20/16     Existing PASRR number confirmed on       FL2 transmitted to all facilities in geographic area requested by pt/family on 02/20/16     FL2 transmitted to all facilities within larger geographic area on       Patient informed that his/her managed care company has contracts with or will negotiate with certain facilities, including the following:        Yes   Patient/family informed of bed offers received.  Patient chooses bed at [facility]     Physician recommends and patient chooses bed at      Patient to be transferred to [facility] on 02/20/16.  Patient to be transferred to facility by [facility]     Patient family notified on 02/20/16 of transfer.  Name of family member notified:  Waunita Schooner     PHYSICIAN Please prepare priority discharge summary, including medications      Additional Comment:    _______________________________________________ Alla German, LCSW 02/20/2016, 3:17 PM

## 2016-02-20 NOTE — Clinical Social Work Note (Signed)
CSW provided pt w/ bed offers. At this time pt wants to go to [facility] and is requesting a private room.  CSW spoke with weekend admissions social worker at [facility] and there is a private room available today. CSW called MD and left a message with the office. Pt's son is going to [facility] at 4:00 to complete paperwork. CSW will need d/c summary to provide to facility. Pt will go by [facility] once there is a summary and d/c order.  [address], [place]

## 2016-02-20 NOTE — Clinical Social Work Note (Signed)
Clinical Social Worker facilitated patient discharge including contacting patient family and facility to confirm patient discharge plans.  Clinical information faxed to facility and family agreeable with plan.  CSW arranged ambulance transport via [facility] to [facility].  RN to call [phone] for report prior to discharge.  Clinical Social Worker will sign off for now as social work intervention is no longer needed. Please consult [place] again if new need arises.  [address] [place]

## 2016-02-20 NOTE — Plan of Care (Signed)
Problem: Safety: Goal: Ability to remain free from injury will improve Outcome: Progressing Safety precautions maintained, no fall or injury noted  Problem: Pain Managment: Goal: General experience of comfort will improve Outcome: Progressing Medicated twice for pain this evening with moderate relief  Problem: Skin Integrity: Goal: Risk for impaired skin integrity will decrease Outcome: Progressing No skin issues noted  Problem: Tissue Perfusion: Goal: Risk factors for ineffective tissue perfusion will decrease Outcome: Progressing SCDs on to RLE, no S/S of DVT noted  Problem: Activity: Goal: Risk for activity intolerance will decrease Outcome: Progressing OOB to [facility] with minimal assistance, she tolerates well  Problem: Bowel/Gastric: Goal: Will not experience complications related to bowel motility Outcome: Progressing Denies any issues related to gastric and bowel

## 2016-02-20 NOTE — Progress Notes (Signed)
ANTICOAGULATION CONSULT NOTE - Initial Consult  Pharmacy Consult for Rivaroxaban Indication: atrial fibrillation  Patient Measurements: Height: 5' 2"  (157.5 cm) Weight: 164 lb (74.4 kg) IBW/kg (Calculated) : 50.1  Assessment: 81yof admitted for ORIF L ankle after Fx.  She developed post op Afib RVR now CVR on metoprolol.  Plan rivaroxaban for VTE px and stroke prevention with Afib. Hgb ok at 11.6, plts wnl. No s/s of bleed.  SCr up to 1.55, CrCl ~78m/min.  Goal of Therapy:  Monitor platelets by anticoagulation protocol: Yes   Plan:  Decrease Xarelto to 17mPO daily Monitor CBC, s/s of bleed  NaElenor QuinonesPharmD, BCJacobi Medical Centerlinical Pharmacist Pager [phone]/03/2016 10:35 AM

## 2016-02-20 NOTE — Progress Notes (Signed)
Patient ID: Brenda Matthews, female   DOB: 1935-05-13, 81 y.o.   MRN: [id number]   Subjective: 2 Days Post-Op Procedure(s) (LRB): OPEN REDUCTION INTERNAL FIXATION (ORIF) Right ankle trimalleolar fracture (Left)    Patient reports pain as mild.  No events.  Social work has been able to make arrangements for discharge to SNF today.  Patient ready  Objective:   VITALS:   Vitals:   02/20/16 0513 02/20/16 1419  BP: (!) 111/54 (!) 112/49  Pulse: 64 60  Resp: 16 16  Temp: 97.9 F (36.6 C) 98.1 F (36.7 C)    Neurovascular intact Incision: dressing C/D/I, left leg splint  LABS  Recent Labs  02/17/16 1630 02/19/16 0901 02/20/16 0842  HGB 12.9 12.6 11.6*  HCT 40.5 39.9 37.2  WBC 8.0 11.1* 10.4  PLT 329 302 304     Recent Labs  02/17/16 1630 02/19/16 0901 02/20/16 0842  NA 140 141 137  K 4.2 4.3 3.9  BUN 23* 17 30*  CREATININE 1.18* 0.95 1.55*  GLUCOSE 124* 127* 188*    No results for input(s): LABPT, INR in the last 72 hours.   Assessment/Plan: 2 Days Post-Op Procedure(s) (LRB): OPEN REDUCTION INTERNAL FIXATION (ORIF) Right ankle trimalleolar fracture (Left)   Discharge to SNF today RTC per [facility]

## 2016-02-20 NOTE — Progress Notes (Signed)
Discharge instructions and medications reviewed with patient. Report called to [facility]. Transport here to transfer patient.

## 2016-02-21 ENCOUNTER — Encounter: Payer: Self-pay | Admitting: Family Medicine

## 2016-02-22 ENCOUNTER — Telehealth: Payer: Self-pay | Admitting: Family Medicine

## 2016-02-22 NOTE — Telephone Encounter (Signed)
Pt called and left VM stating that she has a broken ankle that she had surgery on and needs a call back, Pt did not leave any other info.

## 2016-02-22 NOTE — Telephone Encounter (Signed)
Yes, an appointment for after she gets out of rehab is fine.-thx

## 2016-02-22 NOTE — Telephone Encounter (Signed)
Patient states that she was told to contact her PCP because she was Dx w/ AFIB Patient is at [facility] rehab right now.   Does she just need appointment when she gets out of rehab?  Please advise.

## 2016-02-22 NOTE — Telephone Encounter (Signed)
Please advise 

## 2016-02-22 NOTE — Telephone Encounter (Signed)
Pls call patient and see what her specific need is (from me/our office), then we can go from there.-thx

## 2016-02-23 NOTE — Telephone Encounter (Signed)
Patient advised to [facility] when she gets discharged from rehab.

## 2016-03-15 ENCOUNTER — Other Ambulatory Visit: Payer: Self-pay | Admitting: *Deleted

## 2016-03-15 NOTE — Patient Outreach (Signed)
[facility]) Care Management  03/15/2016  SHIVANI BARRANTES July 11, 1935 [id number]   Met with patient at bedside. Patient reports she is still [facility] and will not see ortho until mid-March. She hopes to go home at that time. She plans to hire assistance for 4 hours a day. She has supportive friends and family to assist with transportation.  RNCM discussed [facility] program and left [facility] brochure and magnet.  Plan to follow up with SW and patient as needed closer to discharge to review discharge planning needs.  Royetta Crochet. Laymond Purser, RN, BSN, [place] [phone]) Business Cell  [phone]) Toll Free Office

## 2016-03-21 ENCOUNTER — Ambulatory Visit (INDEPENDENT_AMBULATORY_CARE_PROVIDER_SITE_OTHER): Payer: Medicare Other | Admitting: Internal Medicine

## 2016-03-21 ENCOUNTER — Encounter: Payer: Self-pay | Admitting: Internal Medicine

## 2016-03-21 VITALS — BP 122/62 | HR 65 | Ht 62.0 in | Wt 160.0 lb

## 2016-03-21 DIAGNOSIS — I10 Essential (primary) hypertension: Secondary | ICD-10-CM

## 2016-03-21 DIAGNOSIS — I480 Paroxysmal atrial fibrillation: Secondary | ICD-10-CM

## 2016-03-21 NOTE — Patient Instructions (Signed)
Your physician wants you to follow-up in: 6 months with Dr. Hilty. You will receive a reminder letter in the mail two months in advance. If you don't receive a letter, please call our office to schedule the follow-up appointment.    

## 2016-03-22 NOTE — Progress Notes (Signed)
OFFICE NOTE  Chief Complaint:  Hospital follow-up  Primary Care Physician: [name], MD  HPI:  Brenda Matthews is a 81 y.o. 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 After induction with anesthesia today, she was noted to go into a-fib with RVR which persists. She has no awareness of this. Risk factors include age, HTN and dyslipidemia. Mom had several strokes, but no known a-fib. Exam benign except irregularly irregular rhythm, post-operative ankle. Apparently had a stress test in 2008 which was negative. She was placed on Xarelto and a low-dose beta blocker. Unfortunately blood pressure was low and she was taken off of her medication. She spontaneously converted while in the hospital and I encouraged her to stay on the [place]. She returns today for follow-up. She reports that she is very anxious to get out of her cast. Hopefully soon she will be in a walking boot. She denies any bleeding problems on the Xarelto. She does not report any recurrent atrial fibrillation.  PMHx:  Past Medical History:  Diagnosis Date  . Abnormal EKG approx 2008   Nuclear stress test neg;   . Anxiety    with panic  . Arthritis   . Cataract    s/p surgery--lens implants  . Chronic renal insufficiency, stage II (mild) 12/04/2012   GFR 60s  . Diverticulosis 2009 colonoscopy  . Fracture of radial shaft, left, closed 11/16/10   fell down flight of stairs  . History of kidney stones   . Hx of adenomatous colonic polyps 2002   surveillance colonoscopy 2009, +polypectomy done-tubular adenoma w/out high grade.  05/2013 tubular adenomas--recall 3 yrs  . Hyperlipidemia   . Hypertension   . Low TSH level 02/18/2016   T3 norm, T4 mildly elevated--suspected sick euthyroid syndrome.  Repeat labs recommended 4-6 wks, do thyroid u/s if still abnormal.  . Osteopenia    DEXA 08/2010; repeat DEXA 02/2015 worse:  fosamax started  . PAF (paroxysmal atrial fibrillation) ([place]) 02/2016   when in post-op for ankle surgery; spontaneously converted in hosp, seen by Dr. Debara Pickett in consultation--metoprolol rate control + xarelto recommended.  . Peripheral edema   . Pneumonia 2015   hx  . Rheumatic fever     Past Surgical History:  Procedure Laterality Date  . APPENDECTOMY  1966   done during surgery for tubal pregnancy  . CATARACT EXTRACTION W/ INTRAOCULAR LENS IMPLANT  2013   bilat  . COLONOSCOPY W/ POLYPECTOMY  05/2013   +diverticulosis; recall 3 yrs (Dr. Henrene Pastor)  . DEXA  02/2015   T score -2.1 in both femoral necks; FRAX 10 yr risk of major osteoporotic fracture was 21%---fosamax started  . EYE SURGERY    . OPEN REDUCTION INTERNAL FIXATION (ORIF) TIBIA/FIBULA FRACTURE Left 02/18/2016   Procedure: OPEN REDUCTION INTERNAL FIXATION (ORIF) Right ankle trimalleolar fracture;  Surgeon: Wylene Simmer, MD;  Location: [facility];  Service: Orthopedics;  Laterality: Left;  requests 54mns  . ORIF RADIAL FRACTURE  11/18/10   left; s/p slip on slippery floor and fell  . TONSILLECTOMY    . TRANSTHORACIC ECHOCARDIOGRAM  02/18/2016   LVEF of 55-60%, mild AI and mild MR and normal biatrial size.      FAMHx:  Family History  Problem Relation Age of Onset  . Heart disease Mother   . Heart disease Father   . Hypertension Brother   . Colon cancer Neg Hx   . Pancreatic  cancer Neg Hx   . Rectal cancer Neg Hx   . Stomach cancer Neg Hx     SOCHx:   reports that she has never smoked. She has never used smokeless tobacco. She reports that she drinks alcohol. She reports that she does not use drugs.  ALLERGIES:  Allergies  Allergen Reactions  . Augmentin [Amoxicillin-Pot Clavulanate] Nausea And Vomiting    "projectile vomiting" Has patient had a PCN reaction causing immediate rash, facial/tongue/throat swelling, SOB or lightheadedness with hypotension:No Has patient had a PCN reaction causing severe rash involving mucus  membranes or skin necrosis:No Has patient had a PCN reaction that required hospitalization:No Has patient had a PCN reaction occurring within the last 10 years:Yes If all of the above answers are "NO", then may proceed with Cephalosporin use.     ROS: Pertinent items noted in HPI and remainder of comprehensive ROS otherwise negative.  HOME MEDS: Current Outpatient Prescriptions on File Prior to Visit  Medication Sig Dispense Refill  . acetaminophen (TYLENOL) 325 MG tablet Take 650 mg by mouth every 6 (six) hours as needed for moderate pain.     Marland Kitchen alendronate (FOSAMAX) 70 MG tablet Take 1 tablet (70 mg total) by mouth every 7 (seven) days. Take with a full glass of water on an empty stomach. (Patient taking differently: Take 70 mg by mouth every Monday. Take with a full glass of water on an empty stomach.) 4 tablet 11  . ALPRAZolam (XANAX) 0.5 MG tablet take 1 tablet by mouth three times a day if needed for STRESS 90 tablet 5  . amLODipine (NORVASC) 10 MG tablet Take 1 tablet (10 mg total) by mouth daily. (Patient taking differently: Take 10 mg by mouth every evening. ) 90 tablet 3  . Ascorbic Acid (VITAMIN C) 500 MG tablet Take 1,000 mg by mouth every evening.     Marland Kitchen atorvastatin (LIPITOR) 40 MG tablet take 1 tablet by mouth once daily (Patient taking differently: take 1 tablet by mouth once daily in the evening) 90 tablet 3  . B Complex Vitamins (B COMPLEX-B12 PO) Take 1 capsule by mouth every evening.     . calcium-vitamin D (OSCAL WITH D) 500-200 MG-UNIT per tablet Take 1 tablet by mouth every evening.     . carboxymethylcellulose (REFRESH PLUS) 0.5 % SOLN Place 2 drops into both eyes daily.    . Coenzyme Q10 200 MG capsule Take 200 mg by mouth every evening.     . docusate sodium (COLACE) 100 MG capsule Take 1 capsule (100 mg total) by mouth 2 (two) times daily. While taking narcotic pain medicine. 30 capsule 0  . furosemide (LASIX) 40 MG tablet take 1 tablet by mouth twice a day (Patient  taking differently: take 1 tablet by mouth daily) 180 tablet 1  . losartan (COZAAR) 50 MG tablet take 1 tablet by mouth once daily 90 tablet 3  . Menthol, Topical Analgesic, (BLUE-EMU MAXIMUM STRENGTH EX) Apply 1 application topically 3 (three) times daily as needed (for knee pain/joint pain.).    Marland Kitchen Multiple Vitamins-Minerals (CENTRUM SILVER ULTRA WOMENS) TABS Take 1 tablet by mouth every evening.     . Omega-3 Fatty Acids (FISH OIL) 1200 MG CAPS Take 1,200 mg by mouth every evening.     Marland Kitchen oxyCODONE (ROXICODONE) 5 MG immediate release tablet Take 1-2 tablets (5-10 mg total) by mouth every 4 (four) hours as needed for moderate pain or severe pain. 30 tablet 0  . PROAIR HFA 108 (90 Base) MCG/ACT  inhaler inhale 2 puffs INTO THE LUNGS every 4 hours if needed for wheezing or shortness of breath ( COUGH SHORTNESS OF BREATH AND WHEEZING) 1 Inhaler 1  . Rivaroxaban (XARELTO) 15 MG TABS tablet Take 1 tablet (15 mg total) by mouth daily with supper. 42 tablet 0  . senna (SENOKOT) 8.6 MG TABS tablet Take 2 tablets (17.2 mg total) by mouth 2 (two) times daily. 30 each 0  . sodium chloride (OCEAN) 0.65 % nasal spray Place 1 spray into the nose daily. After shower     No current facility-administered medications on file prior to visit.     LABS/IMAGING: No results found for this or any previous visit (from the past 48 hour(s)). No results found.  WEIGHTS: Wt Readings from Last 3 Encounters:  03/21/16 160 lb (72.6 kg)  02/18/16 164 lb (74.4 kg)  02/17/16 166 lb (75.3 kg)    VITALS: BP 122/62   Pulse 65   Ht 5' 2"  (1.575 m)   Wt 160 lb (72.6 kg)   LMP  (LMP Unknown)   BMI 29.26 kg/m   EXAM: General appearance: alert and no distress Neck: no carotid bruit and no JVD Lungs: clear to auscultation bilaterally Heart: regular rate and rhythm Abdomen: soft, non-tender; bowel sounds normal; no masses,  no organomegaly Extremities: Left lower leg in a cast Pulses: 2+ and symmetric Skin: Skin color,  texture, turgor normal. No rashes or lesions Neurologic: Grossly normal Psych: Pleasant  EKG: Normal sinus rhythm at 65  ASSESSMENT: 1. Paroxysmal atrial fibrillation-CHADSVASC score of 3 2. Hypertension-controlled 3. Family history of stroke  PLAN: 1.   Ms. Tripp had paroxysmal atrial fibrillation which was postoperative, however in retrospect she does recall some irregularity to her heart rate prior to surgery. Is not clear that this is just related to surgery, however echo was reassuring showing normal EF and normal biatrial size. Nonetheless given her CHADSVASC score 3, I would advocate continuing Xarelto long-term as it is well tolerated. Blood pressure is at goal. Plan to see her back in 6 months or sooner as necessary.  [name], MD, [facility] Attending Cardiologist [place] 03/22/2016, 6:04 PM

## 2016-03-26 ENCOUNTER — Encounter: Payer: Self-pay | Admitting: Family Medicine

## 2016-03-31 ENCOUNTER — Other Ambulatory Visit: Payer: Self-pay | Admitting: *Deleted

## 2016-03-31 NOTE — Patient Outreach (Signed)
[facility] ([facility]) Care Management  03/31/2016  Brenda Matthews 08/14/1935 [id number]   Met with SW, [name] at facility, he states patient to discharge home 04/02/16. She will go home with private pay caregivers. No [facility] care management needs addressed. Patient not in her room on rounds.  Plan to sign off no [facility] care management needs, patient has [facility] care management brochure for reference.   Mary E. Niemczura, RN, BSN, CCM  Post Acute Care Coordinator [facility] ([phone]) Business Cell  ([phone]) Toll Free Office 

## 2016-04-05 ENCOUNTER — Telehealth: Payer: Self-pay | Admitting: *Deleted

## 2016-04-05 ENCOUNTER — Other Ambulatory Visit: Payer: Self-pay | Admitting: Family Medicine

## 2016-04-05 NOTE — Telephone Encounter (Signed)
Agree.  Signed:  Crissie Sickles, MD           04/05/2016

## 2016-04-05 NOTE — Telephone Encounter (Signed)
Pt called stated that she needed a refill on her xarelto which was started after she went into A-Fib during a surgical procedure. She stated that she only had 2 tablets left. I reviewed her chart and recommended that she contact her cardiologist, last cardiologist she seen was Dr. Debara Pickett. Phone number to [facility] was given to pt. She voiced understanding.

## 2016-04-06 ENCOUNTER — Other Ambulatory Visit: Payer: Self-pay

## 2016-04-06 MED ORDER — RIVAROXABAN 15 MG PO TABS: 15.0000 mg | ORAL_TABLET | Freq: Every day | ORAL | 1 refills | Status: DC

## 2016-04-13 ENCOUNTER — Encounter: Payer: Self-pay | Admitting: Internal Medicine

## 2016-04-18 ENCOUNTER — Other Ambulatory Visit: Payer: Self-pay | Admitting: Internal Medicine

## 2016-04-18 MED ORDER — METOPROLOL SUCCINATE ER 25 MG PO TB24: 25.0000 mg | ORAL_TABLET | Freq: Two times a day (BID) | ORAL | 3 refills | Status: DC

## 2016-04-18 NOTE — Telephone Encounter (Signed)
Rx(s) sent to pharmacy electronically.  

## 2016-04-18 NOTE — Telephone Encounter (Signed)
New message    *STAT* If patient is at the pharmacy, call can be transferred to refill team.   1. Which medications need to be refilled? (please list name of each medication and dose if known) metoprolol succinate (TOPROL-XL) 25 MG 24 hr tablet  2. Which pharmacy/location (including street and city if local pharmacy) is medication to be sent to? [facility] on [id number]  3. Do they need a 30 day or 90 day supply? 90 day supply

## 2016-05-17 LAB — HM MAMMOGRAPHY

## 2016-05-18 ENCOUNTER — Encounter: Payer: Self-pay | Admitting: Family Medicine

## 2016-06-15 ENCOUNTER — Encounter: Payer: Medicare Other | Admitting: Family Medicine

## 2016-07-04 ENCOUNTER — Encounter: Payer: Self-pay | Admitting: Family Medicine

## 2016-07-04 ENCOUNTER — Ambulatory Visit (INDEPENDENT_AMBULATORY_CARE_PROVIDER_SITE_OTHER): Payer: Medicare Other | Admitting: Family Medicine

## 2016-07-04 VITALS — BP 138/78 | HR 63 | Temp 97.5°F | Resp 16 | Ht 62.0 in | Wt 165.0 lb

## 2016-07-04 DIAGNOSIS — Z0000 Encounter for general adult medical examination without abnormal findings: Secondary | ICD-10-CM

## 2016-07-04 DIAGNOSIS — R946 Abnormal results of thyroid function studies: Secondary | ICD-10-CM | POA: Diagnosis not present

## 2016-07-04 DIAGNOSIS — I440 Atrioventricular block, first degree: Secondary | ICD-10-CM | POA: Diagnosis not present

## 2016-07-04 DIAGNOSIS — R7989 Other specified abnormal findings of blood chemistry: Secondary | ICD-10-CM

## 2016-07-04 LAB — COMPREHENSIVE METABOLIC PANEL
ALT: 12 U/L (ref 0–35)
AST: 15 U/L (ref 0–37)
Albumin: 4.3 g/dL (ref 3.5–5.2)
Alkaline Phosphatase: 78 U/L (ref 39–117)
BUN: 19 mg/dL (ref 6–23)
CALCIUM: 9.6 mg/dL (ref 8.4–10.5)
CO2: 29 mEq/L (ref 19–32)
CREATININE: 0.92 mg/dL (ref 0.40–1.20)
Chloride: 106 mEq/L (ref 96–112)
GFR: 62.52 mL/min (ref >60.00)
Glucose, Bld: 99 mg/dL (ref 70–99)
Potassium: 4.1 mEq/L (ref 3.5–5.1)
Sodium: 142 mEq/L (ref 135–145)
Total Bilirubin: 0.8 mg/dL (ref 0.2–1.2)
Total Protein: 6.8 g/dL (ref 6.0–8.3)

## 2016-07-04 LAB — CBC WITH DIFFERENTIAL/PLATELET
BASOS ABS: 0.1 10*3/uL (ref 0.0–0.1)
Basophils Relative: 0.8 % (ref 0.0–3.0)
Eosinophils Absolute: 0.2 10*3/uL (ref 0.0–0.7)
Eosinophils Relative: 4 % (ref 0.0–5.0)
HCT: 45.2 % (ref 36.0–46.0)
Hemoglobin: 14.5 g/dL (ref 12.0–15.0)
LYMPHS ABS: 2 10*3/uL (ref 0.7–4.0)
Lymphocytes Relative: 32.4 % (ref 12.0–46.0)
MCHC: 32.2 g/dL (ref 30.0–36.0)
MCV: 88.7 fl (ref 78.0–100.0)
MONO ABS: 0.4 10*3/uL (ref 0.1–1.0)
MONOS PCT: 5.8 % (ref 3.0–12.0)
NEUTROS ABS: 3.5 10*3/uL (ref 1.4–7.7)
NEUTROS PCT: 57 % (ref 43.0–77.0)
PLATELETS: 242 10*3/uL (ref 150.0–400.0)
RBC: 5.1 Mil/uL (ref 3.87–5.11)
RDW: 14.4 % (ref 11.5–15.5)
WBC: 6.1 10*3/uL (ref 4.0–10.5)

## 2016-07-04 LAB — LIPID PANEL
CHOL/HDL RATIO: 3
Cholesterol: 157 mg/dL (ref 0–200)
HDL: 60.7 mg/dL (ref >39.00)
LDL Cholesterol: 71 mg/dL (ref 0–99)
NONHDL: 96.54
TRIGLYCERIDES: 126 mg/dL (ref 0.0–149.0)
VLDL: 25.2 mg/dL (ref 0.0–40.0)

## 2016-07-04 LAB — TSH: TSH: 0.77 u[IU]/mL (ref 0.35–4.50)

## 2016-07-04 LAB — T4, FREE: Free T4: 1.08 ng/dL (ref 0.60–1.60)

## 2016-07-04 NOTE — Progress Notes (Signed)
Office Note 07/04/2016  CC:  Chief Complaint  Patient presents with  . Annual Exam    Pt is fasting.     HPI:  Brenda Matthews is a 81 y.o. White female who is here for annual health maintenance exam. Fractured ankle 02/2016--got ORIF.  Still with occ mild pain that occ tylenol takes care of.  Eye exam: UTD, next one planned for later this summer. Dental: preventatives UTD. Exercise: beginning to walk more since ankle rehab is done. Diet: fairly healthy.  Past Medical History:  Diagnosis Date  . Abnormal EKG approx 2008   Nuclear stress test neg;   . Anxiety    with panic  . Arthritis   . Cataract    s/p surgery--lens implants  . Chronic renal insufficiency, stage II (mild) 12/04/2012   GFR 60s  . Diverticulosis 2009 colonoscopy  . Fracture of radial shaft, left, closed 11/16/10   fell down flight of stairs  . History of kidney stones   . Hx of adenomatous colonic polyps 2002   surveillance colonoscopy 2009, +polypectomy done-tubular adenoma w/out high grade.  05/2013 tubular adenomas--recall 3 yrs  . Hyperlipidemia   . Hypertension   . Low TSH level 02/18/2016   T3 norm, T4 mildly elevated--suspected sick euthyroid syndrome.  Repeat labs recommended 4-6 wks, do thyroid u/s if still abnormal.  . Osteopenia    DEXA 08/2010; repeat DEXA 02/2015 worse: fosamax started  . PAF (paroxysmal atrial fibrillation) ([place]) 02/2016   when in post-op for ankle surgery; spontaneously converted in hosp, seen by Dr. Debara Pickett in consultation--metoprolol rate control + xarelto recommended.  Metop d/c due to hypot.  Plan to cont xarelto indef due to CHAD-VASc score of 3.  . Peripheral edema   . Pneumonia 2015   hx  . Rheumatic fever     Past Surgical History:  Procedure Laterality Date  . APPENDECTOMY  1966   done during surgery for tubal pregnancy  . CATARACT EXTRACTION W/ INTRAOCULAR LENS IMPLANT  2013   bilat  . COLONOSCOPY W/ POLYPECTOMY  05/2013   +diverticulosis; recall 3 yrs (Dr.  Henrene Pastor)  . DEXA  02/2015   T score -2.1 in both femoral necks; FRAX 10 yr risk of major osteoporotic fracture was 21%---fosamax started  . EYE SURGERY    . OPEN REDUCTION INTERNAL FIXATION (ORIF) TIBIA/FIBULA FRACTURE Left 02/18/2016   Procedure: OPEN REDUCTION INTERNAL FIXATION (ORIF) Right ankle trimalleolar fracture;  Surgeon: Wylene Simmer, MD;  Location: [place];  Service: Orthopedics;  Laterality: Left;  requests 40mns  . ORIF RADIAL FRACTURE  11/18/10   left; s/p slip on slippery floor and fell  . TONSILLECTOMY    . TRANSTHORACIC ECHOCARDIOGRAM  02/18/2016   LVEF of 55-60%, mild AI and mild MR and normal biatrial size.      Family History  Problem Relation Age of Onset  . Heart disease Mother   . Heart disease Father   . Hypertension Brother   . Colon cancer Neg Hx   . Pancreatic cancer Neg Hx   . Rectal cancer Neg Hx   . Stomach cancer Neg Hx     Social History   Social History  . Marital status: Widowed    Spouse name: N/A  . Number of children: N/A  . Years of education: N/A   Occupational History  . Not on file.   Social History Main Topics  . Smoking status: Never Smoker  . Smokeless tobacco: Never Used  . Alcohol use Yes  Comment: occ  . Drug use: No  . Sexual activity: Not on file   Other Topics Concern  . Not on file   Social History Narrative   Widow, 2 sons.   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   No tobacco.  Rare alcohol.   No drugs.  Exercise: 4 times per week, about 46m.    Outpatient Medications Prior to Visit  Medication Sig Dispense Refill  . acetaminophen (TYLENOL) 325 MG tablet Take 650 mg by mouth every 6 (six) hours as needed for moderate pain.     .Marland Kitchenalendronate (FOSAMAX) 70 MG tablet Take 1 tablet (70 mg total) by mouth every 7 (seven) days. Take with a full glass of water on an empty stomach. (Patient taking differently: Take 70 mg by mouth every Monday. Take with a full glass of water on an empty stomach.) 4 tablet 11  . ALPRAZolam  (XANAX) 0.5 MG tablet take 1 tablet by mouth three times a day if needed for STRESS 90 tablet 5  . amLODipine (NORVASC) 10 MG tablet take 1 tablet by mouth once daily 90 tablet 0  . Apoaequorin (PREVAGEN) 10 MG CAPS Take 1 capsule by mouth daily.    . Ascorbic Acid (VITAMIN C) 500 MG tablet Take 1,000 mg by mouth every evening.     .Marland Kitchenatorvastatin (LIPITOR) 40 MG tablet take 1 tablet by mouth once daily 90 tablet 0  . B Complex Vitamins (B COMPLEX-B12 PO) Take 1 capsule by mouth every evening.     . calcium-vitamin D (OSCAL WITH D) 500-200 MG-UNIT per tablet Take 1 tablet by mouth every evening.     . carboxymethylcellulose (REFRESH PLUS) 0.5 % SOLN Place 2 drops into both eyes daily.    . Coenzyme Q10 200 MG capsule Take 200 mg by mouth every evening.     . docusate sodium (COLACE) 100 MG capsule Take 1 capsule (100 mg total) by mouth 2 (two) times daily. While taking narcotic pain medicine. 30 capsule 0  . furosemide (LASIX) 40 MG tablet take 1 tablet by mouth twice a day 180 tablet 0  . losartan (COZAAR) 50 MG tablet take 1 tablet by mouth once daily 90 tablet 0  . Menthol, Topical Analgesic, (BLUE-EMU MAXIMUM STRENGTH EX) Apply 1 application topically 3 (three) times daily as needed (for knee pain/joint pain.).    .Marland Kitchenmetoprolol succinate (TOPROL-XL) 25 MG 24 hr tablet Take 1 tablet (25 mg total) by mouth 2 (two) times daily. 180 tablet 3  . Multiple Vitamins-Minerals (A[place]ORNE PO) Take 1 tablet by mouth daily.    . Multiple Vitamins-Minerals (CENTRUM SILVER ULTRA WOMENS) TABS Take 1 tablet by mouth every evening.     . Omega-3 Fatty Acids (FISH OIL) 1200 MG CAPS Take 1,200 mg by mouth every evening.     .Marland KitchenPROAIR HFA 108 (90 Base) MCG/ACT inhaler inhale 2 puffs INTO THE LUNGS every 4 hours if needed for wheezing or shortness of breath ( COUGH SHORTNESS OF BREATH AND WHEEZING) 1 Inhaler 1  . Rivaroxaban (XARELTO) 15 MG TABS tablet Take 1 tablet (15 mg total) by mouth daily with supper. 90 tablet 1   . sodium chloride (OCEAN) 0.65 % nasal spray Place 1 spray into the nose daily. After shower    . oxyCODONE (ROXICODONE) 5 MG immediate release tablet Take 1-2 tablets (5-10 mg total) by mouth every 4 (four) hours as needed for moderate pain or severe pain. 30 tablet 0  . senna (SENOKOT) 8.6 MG TABS tablet  Take 2 tablets (17.2 mg total) by mouth 2 (two) times daily. (Patient not taking: Reported on 07/04/2016) 30 each 0   No facility-administered medications prior to visit.     Allergies  Allergen Reactions  . Augmentin [Amoxicillin-Pot Clavulanate] Nausea And Vomiting    "projectile vomiting" Has patient had a PCN reaction causing immediate rash, facial/tongue/throat swelling, SOB or lightheadedness with hypotension:No Has patient had a PCN reaction causing severe rash involving mucus membranes or skin necrosis:No Has patient had a PCN reaction that required hospitalization:No Has patient had a PCN reaction occurring within the last 10 years:Yes If all of the above answers are "NO", then may proceed with Cephalosporin use.     ROS Review of Systems  Constitutional: Negative for appetite change, chills, fatigue and fever.  HENT: Negative for congestion, dental problem, ear pain and sore throat.   Eyes: Negative for discharge, redness and visual disturbance.  Respiratory: Negative for cough, chest tightness, shortness of breath and wheezing.   Cardiovascular: Negative for chest pain, palpitations and leg swelling.  Gastrointestinal: Negative for abdominal pain, blood in stool, diarrhea, nausea and vomiting.  Genitourinary: Negative for difficulty urinating, dysuria, flank pain, frequency, hematuria and urgency.  Musculoskeletal: Positive for arthralgias (intermittent mild L ankle pain). Negative for back pain, joint swelling, myalgias and neck stiffness.  Skin: Negative for pallor and rash.  Neurological: Negative for dizziness, speech difficulty, weakness and headaches.   Hematological: Negative for adenopathy. Does not bruise/bleed easily.  Psychiatric/Behavioral: Negative for confusion and sleep disturbance. The patient is not nervous/anxious.     PE; Blood pressure 138/78, pulse 63, temperature 97.5 F (36.4 C), temperature source Oral, resp. rate 16, height 5' 2"  (1.575 m), weight 165 lb (74.8 kg), SpO2 96 %.  Pt examined with Sharen Hones, CMA, as chaperone. Gen: Alert, well appearing.  Patient is oriented to person, place, time, and situation. AFFECT: pleasant, lucid thought and speech. ENT: Ears: EACs clear, normal epithelium.  TMs with good light reflex and landmarks bilaterally.  Eyes: no injection, icteris, swelling, or exudate.  EOMI, PERRLA. Nose: no drainage or turbinate edema/swelling.  No injection or focal lesion.  Mouth: lips without lesion/swelling.  Oral mucosa pink and moist.  Dentition intact and without obvious caries or gingival swelling.  Oropharynx without erythema, exudate, or swelling.  Neck: supple/nontender.  No LAD, mass, or TM.  Carotid pulses 2+ bilaterally, without bruits. CV: RRR, no m/r/g.   LUNGS: CTA bilat, nonlabored resps, good aeration in all lung fields. ABD: soft, NT, ND, BS normal.  No hepatospenomegaly or mass.  No bruits. EXT: no clubbing or cyanosis.  1+ bilat pitting edema in LL's.   Musculoskeletal: no joint swelling, erythema, warmth, or tenderness.  ROM of all joints intact. Skin - no sores or suspicious lesions or rashes or color changes   Pertinent labs:  Lab Results  Component Value Date   TSH 0.271 (L) 02/19/2016   Lab Results  Component Value Date   WBC 10.4 02/20/2016   HGB 11.6 (L) 02/20/2016   HCT 37.2 02/20/2016   MCV 89.6 02/20/2016   PLT 304 02/20/2016   Lab Results  Component Value Date   CREATININE 1.55 (H) 02/20/2016   BUN 30 (H) 02/20/2016   NA 137 02/20/2016   K 3.9 02/20/2016   CL 102 02/20/2016   CO2 27 02/20/2016   Lab Results  Component Value Date   ALT 16 02/19/2016    AST 21 02/19/2016   ALKPHOS 83 02/19/2016   BILITOT 0.8  02/19/2016   Lab Results  Component Value Date   CHOL 146 12/16/2015   Lab Results  Component Value Date   HDL 57.10 12/16/2015   Lab Results  Component Value Date   LDLCALC 62 12/16/2015   Lab Results  Component Value Date   TRIG 135.0 12/16/2015   Lab Results  Component Value Date   CHOLHDL 3 12/16/2015    ASSESSMENT AND PLAN:   Health maintenance exam: Reviewed age and gender appropriate health maintenance issues (prudent diet, regular exercise, health risks of tobacco and excessive alcohol, use of seatbelts, fire alarms in home, use of sunscreen).  Also reviewed age and gender appropriate health screening as well as vaccine recommendations. Vaccines UTD: discussed shingrix- Labs: fasting HP today.  Will f/u thyroid labs (?euthyroid sick syndrome while in hosp 02/2016 for ankle fracture/surgery). If these thyroid labs are still abnl, will check thyroid u/s. Colon ca screening: hx of polyps--recall 05/2016--has appt this week with GI to see if they want to proceed with this since she now has PAF and is on xarelto. Cerv ca screening: no further pap's/pelvics. Breast ca screening: mammogram UTD 05/17/16. Osteoporosis: taking alendronate.  F/u DEXA due 2019.  An After Visit Summary was printed and given to the patient.  FOLLOW UP:  Return in about 6 months (around 01/03/2017) for routine chronic illness f/u.  Signed:  Crissie Sickles, MD           07/04/2016

## 2016-07-05 LAB — T3: T3 TOTAL: 115 ng/dL (ref 76–181)

## 2016-07-06 ENCOUNTER — Ambulatory Visit: Payer: Medicare Other | Admitting: Gastroenterology

## 2016-07-06 ENCOUNTER — Telehealth: Payer: Self-pay | Admitting: Gastroenterology

## 2016-07-06 NOTE — Telephone Encounter (Signed)
Noted! Thank you

## 2016-07-07 ENCOUNTER — Telehealth: Payer: Self-pay | Admitting: Family Medicine

## 2016-07-07 ENCOUNTER — Other Ambulatory Visit: Payer: Self-pay | Admitting: Family Medicine

## 2016-07-07 MED ORDER — ALPRAZOLAM 0.5 MG PO TABS: ORAL_TABLET | ORAL | 5 refills | Status: DC

## 2016-07-07 NOTE — Telephone Encounter (Signed)
Alprazolam rx printed.

## 2016-07-07 NOTE — Telephone Encounter (Signed)
Xanax 0.5 MG #90 last refill 05/18/16

## 2016-07-07 NOTE — Telephone Encounter (Signed)
Rx faxed

## 2016-07-22 NOTE — Progress Notes (Deleted)
Subjective:   Brenda Matthews is a 81 y.o. female who presents for Medicare Annual (Subsequent) preventive examination.  Review of Systems:  No ROS.  Medicare Wellness Visit. Additional risk factors are reflected in the social history.    Sleep patterns:  Home Safety/Smoke Alarms: Feels safe in home. Smoke alarms in place.  Living environment; residence and Firearm Safety:  [place] Safety/Bike Helmet: Wears seat belt.   Counseling:   Eye Exam-  Dental-  Female:   [id number]       Mammo-05/17/2016, negative.        Dexa scan-02/23/2015, Osteopenia.         CCS-Colonoscopy 05/30/2013, polyps. Recall 3 years.      Objective:     Vitals: LMP  (LMP Unknown)   There is no height or weight on file to calculate BMI.   Tobacco History  Smoking Status  . Never Smoker  Smokeless Tobacco  . Never Used     Counseling given: Not Answered   Past Medical History:  Diagnosis Date  . Abnormal EKG approx 2008   Nuclear stress test neg;   . Anxiety    with panic  . Arthritis   . Cataract    s/p surgery--lens implants  . Chronic renal insufficiency, stage II (mild) 12/04/2012   GFR 60s  . Diverticulosis 2009 colonoscopy  . Fracture of radial shaft, left, closed 11/16/10   fell down flight of stairs  . History of kidney stones   . Hx of adenomatous colonic polyps 2002   surveillance colonoscopy 2009, +polypectomy done-tubular adenoma w/out high grade.  05/2013 tubular adenomas--recall 3 yrs  . Hyperlipidemia   . Hypertension   . Low TSH level 02/18/2016   T3 norm, T4 mildly elevated--suspected sick euthyroid syndrome.  Repeat labs recommended 4-6 wks, do thyroid u/s if still abnormal.  . Osteopenia    DEXA 08/2010; repeat DEXA 02/2015 worse: fosamax started  . PAF (paroxysmal atrial fibrillation) ([place]) 02/2016   when in post-op for ankle surgery; spontaneously converted in hosp, seen by Dr. Debara Pickett in consultation--metoprolol rate control + xarelto recommended.  Metop d/c due to  hypot.  Plan to cont xarelto indef due to CHAD-VASc score of 3.  . Peripheral edema   . Pneumonia 2015   hx  . Rheumatic fever    Past Surgical History:  Procedure Laterality Date  . APPENDECTOMY  1966   done during surgery for tubal pregnancy  . CATARACT EXTRACTION W/ INTRAOCULAR LENS IMPLANT  2013   bilat  . COLONOSCOPY W/ POLYPECTOMY  05/2013   +diverticulosis; recall 3 yrs (Dr. Henrene Pastor)  . DEXA  02/2015   T score -2.1 in both femoral necks; FRAX 10 yr risk of major osteoporotic fracture was 21%---fosamax started  . EYE SURGERY    . OPEN REDUCTION INTERNAL FIXATION (ORIF) TIBIA/FIBULA FRACTURE Left 02/18/2016   Procedure: OPEN REDUCTION INTERNAL FIXATION (ORIF) Right ankle trimalleolar fracture;  Surgeon: Wylene Simmer, MD;  Location: [facility];  Service: Orthopedics;  Laterality: Left;  requests 61mns  . ORIF RADIAL FRACTURE  11/18/10   left; s/p slip on slippery floor and fell  . TONSILLECTOMY    . TRANSTHORACIC ECHOCARDIOGRAM  02/18/2016   LVEF of 55-60%, mild AI and mild MR and normal biatrial size.     Family History  Problem Relation Age of Onset  . Heart disease Mother   . Heart disease Father   . Hypertension Brother   . Colon cancer Neg Hx   . Pancreatic  cancer Neg Hx   . Rectal cancer Neg Hx   . Stomach cancer Neg Hx    History  Sexual Activity  . Sexual activity: Not on file    Outpatient Encounter Prescriptions as of 07/25/2016  Medication Sig  . acetaminophen (TYLENOL) 325 MG tablet Take 650 mg by mouth every 6 (six) hours as needed for moderate pain.   Marland Kitchen alendronate (FOSAMAX) 70 MG tablet Take 1 tablet (70 mg total) by mouth every 7 (seven) days. Take with a full glass of water on an empty stomach. (Patient taking differently: Take 70 mg by mouth every Monday. Take with a full glass of water on an empty stomach.)  . ALPRAZolam (XANAX) 0.5 MG tablet take 1 tablet by mouth three times a day if needed for STRESS  . amLODipine (NORVASC) 10 MG tablet take 1 tablet by mouth  once daily  . Apoaequorin (PREVAGEN) 10 MG CAPS Take 1 capsule by mouth daily.  . Ascorbic Acid (VITAMIN C) 500 MG tablet Take 1,000 mg by mouth every evening.   Marland Kitchen atorvastatin (LIPITOR) 40 MG tablet take 1 tablet by mouth once daily  . B Complex Vitamins (B COMPLEX-B12 PO) Take 1 capsule by mouth every evening.   . calcium-vitamin D (OSCAL WITH D) 500-200 MG-UNIT per tablet Take 1 tablet by mouth every evening.   . carboxymethylcellulose (REFRESH PLUS) 0.5 % SOLN Place 2 drops into both eyes daily.  . Coenzyme Q10 200 MG capsule Take 200 mg by mouth every evening.   . docusate sodium (COLACE) 100 MG capsule Take 1 capsule (100 mg total) by mouth 2 (two) times daily. While taking narcotic pain medicine.  . furosemide (LASIX) 40 MG tablet take 1 tablet by mouth twice a day  . losartan (COZAAR) 50 MG tablet take 1 tablet by mouth once daily  . Menthol, Topical Analgesic, (BLUE-EMU MAXIMUM STRENGTH EX) Apply 1 application topically 3 (three) times daily as needed (for knee pain/joint pain.).  Marland Kitchen metoprolol succinate (TOPROL-XL) 25 MG 24 hr tablet Take 1 tablet (25 mg total) by mouth 2 (two) times daily.  . Multiple Vitamins-Minerals (AIRBORNE PO) Take 1 tablet by mouth daily.  . Multiple Vitamins-Minerals (CENTRUM SILVER ULTRA WOMENS) TABS Take 1 tablet by mouth every evening.   . Omega-3 Fatty Acids (FISH OIL) 1200 MG CAPS Take 1,200 mg by mouth every evening.   Marland Kitchen PROAIR HFA 108 (90 Base) MCG/ACT inhaler inhale 2 puffs INTO THE LUNGS every 4 hours if needed for wheezing or shortness of breath ( COUGH SHORTNESS OF BREATH AND WHEEZING)  . Rivaroxaban (XARELTO) 15 MG TABS tablet Take 1 tablet (15 mg total) by mouth daily with supper.  . sodium chloride (OCEAN) 0.65 % nasal spray Place 1 spray into the nose daily. After shower   No facility-administered encounter medications on file as of 07/25/2016.     Activities of Daily Living In your present state of health, do you have any difficulty performing  the following activities: 02/19/2016 02/17/2016  Hearing? N N  Vision? N N  Difficulty concentrating or making decisions? N N  Walking or climbing stairs? Y Y  Dressing or bathing? Y N  Doing errands, shopping? Tempie Donning  Some recent data might be hidden    Patient Care Team: [name], MD as PCP - General (Family Medicine) Iran Planas, MD as Consulting Physician (Orthopedic Surgery) Deterding, Jeneen Rinks, MD as Consulting Physician (Nephrology) Latanya Maudlin, MD as Consulting Physician (Orthopedic Surgery) Danella Sensing, MD as Consulting Physician (  Dermatology) Irene Shipper, MD as Consulting Physician (Gastroenterology) Debara Pickett Nadean Corwin, MD as Consulting Physician (Cardiology) Debara Pickett Nadean Corwin, MD as Consulting Physician (Cardiology)    Assessment:    Physical assessment deferred to PCP.  Exercise Activities and Dietary recommendations   Diet (meal preparation, eat out, water intake, caffeinated beverages, dairy products, fruits and vegetables):   Breakfast: Lunch:  Dinner:      Goals    None     Fall Risk Fall Risk  05/25/2015 03/06/2015 06/18/2014  Falls in the past year? No No No   Depression Screen PHQ 2/9 Scores 05/25/2015 03/06/2015 10/25/2014 06/18/2014  PHQ - 2 Score 0 0 0 0     Cognitive Function        Immunization History  Administered Date(s) Administered  . H1N1 01/07/2008  . Influenza Split 11/01/2010, 11/15/2011  . Influenza Whole 12/27/2006, 11/07/2007, 10/31/2008, 12/29/2009  . Influenza, High Dose Seasonal PF 10/17/2014  . Influenza,inj,Quad PF,36+ Mos 11/20/2013  . Influenza-Unspecified 09/30/2015  . Pneumococcal Conjugate-13 07/16/2013  . Pneumococcal Polysaccharide-23 11/07/2007  . Td 12/10/2008  . Zoster 04/05/2007   Screening Tests Health Maintenance  Topic Date Due  . COLONOSCOPY  05/30/2016  . INFLUENZA VACCINE  08/17/2016  . MAMMOGRAM  05/17/2017  . TETANUS/TDAP  12/11/2018  . DEXA SCAN  Completed  . PNA vac Low Risk Adult   Completed      Plan:     I have personally reviewed and noted the following in the patient's chart:   . Medical and social history . Use of alcohol, tobacco or illicit drugs  . Current medications and supplements . Functional ability and status . Nutritional status . Physical activity . Advanced directives . List of other physicians . Hospitalizations, surgeries, and ER visits in previous 12 months . Vitals . Screenings to include cognitive, depression, and falls . Referrals and appointments  In addition, I have reviewed and discussed with patient certain preventive protocols, quality metrics, and best practice recommendations. A written personalized care plan for preventive services as well as general preventive health recommendations were provided to patient.     Gerilyn Nestle, RN  07/22/2016

## 2016-07-25 ENCOUNTER — Ambulatory Visit: Payer: Medicare Other | Admitting: Podiatry

## 2016-07-25 ENCOUNTER — Ambulatory Visit: Payer: Medicare Other

## 2016-07-29 NOTE — Progress Notes (Signed)
Subjective:   Brenda Matthews is a 81 y.o. female who presents for Medicare Annual (Subsequent) preventive examination.  Review of Systems:  No ROS.  Medicare Wellness Visit. Additional risk factors are reflected in the social history.  Cardiac Risk Factors include: advanced age (>41mn, >>25women);dyslipidemia;hypertension;obesity (BMI >30kg/m2);family history of premature cardiovascular disease   Sleep patterns: Sleeps 7 hours, feels rested. Up to void x 1.  Home Safety/Smoke Alarms: Feels safe in home. Smoke alarms in place.  Living environment; residence and Firearm Safety: Lives alone in 2 story home. Son lives close.  Seat Belt Safety/Bike Helmet: Wears seat belt.   Counseling:   [place] Exam-[place] 08/2015[place] [facility] 07[place], every 6 months. Top dentures.   Female:   [id number]     Mammo-05/17/2016, negative.        Dexa scan-02/23/2015, Osteopenia.       CCS-Colonoscopy 05/30/2013, polyp. Recall 3 years. Appt with GI on 08/12/16    Objective:     Vitals: BP 128/76 (BP Location: Left Arm, Patient Position: Sitting, Cuff Size: Normal)   Pulse 79   Ht 5' 2"  (1.575 m)   Wt 164 lb 6.4 oz (74.6 kg)   LMP  (LMP Unknown)   SpO2 97%   BMI 30.07 kg/m   Body mass index is 30.07 kg/m.   Tobacco History  Smoking Status  . Never Smoker  Smokeless Tobacco  . Never Used     Counseling given: Not Answered   Past Medical History:  Diagnosis Date  . Abnormal EKG approx 2008   Nuclear stress test neg;   . Anxiety    with panic  . Arthritis   . Cataract    s/p surgery--lens implants  . Chronic renal insufficiency, stage II (mild) 12/04/2012   GFR 60s  . Diverticulosis 2009 colonoscopy  . Fracture of radial shaft, left, closed 11/16/10   fell down flight of stairs  . History of kidney stones   . Hx of adenomatous colonic polyps 2002   surveillance colonoscopy 2009, +polypectomy done-tubular adenoma w/out high grade.  05/2013  tubular adenomas--recall 3 yrs  . Hyperlipidemia   . Hypertension   . Low TSH level 02/18/2016   T3 norm, T4 mildly elevated--suspected sick euthyroid syndrome.  Repeat labs recommended 4-6 wks, do thyroid u/s if still abnormal.  . Osteopenia    DEXA 08/2010; repeat DEXA 02/2015 worse: fosamax started  . [place]   when in post-op for ankle surgery; spontaneously converted in hosp, seen by Dr. HDebara Pickettin consultation--metoprolol rate control + xarelto recommended.  Metop d/c due to hypot.  Plan to cont xarelto indef due to CHAD-VASc score of 3.  . Peripheral edema   . Pneumonia 2015   hx  . Rheumatic fever    Past Surgical History:  Procedure Laterality Date  . APPENDECTOMY  1966   done during surgery for tubal pregnancy  . CATARACT EXTRACTION W/ INTRAOCULAR LENS IMPLANT  2013   bilat  . COLONOSCOPY W/ POLYPECTOMY  05/2013   +diverticulosis; recall 3 yrs (Dr. PHenrene Pastor  . DEXA  02/2015   T score -2.1 in both femoral necks; FRAX 10 yr risk of major osteoporotic fracture was 21%---fosamax started  . EYE SURGERY    . OPEN REDUCTION INTERNAL FIXATION (ORIF) TIBIA/FIBULA FRACTURE Left 02/18/2016   Procedure: OPEN REDUCTION INTERNAL FIXATION (ORIF) Right ankle trimalleolar fracture;  Surgeon: JWylene Simmer MD;  [place] [facility]  Service: Orthopedics;  Laterality: Left;  requests 60mns  . ORIF RADIAL FRACTURE  11/18/10   left; s/p slip on slippery floor and fell  . TONSILLECTOMY    . TRANSTHORACIC ECHOCARDIOGRAM  02/18/2016   LVEF of 55-60%, mild AI and mild MR and normal biatrial size.     Family History  Problem Relation Age of Onset  . Heart disease Mother   . Heart disease Father   . Hypertension Brother   . Diabetes Sister   . Colon cancer Neg Hx   . Pancreatic cancer Neg Hx   . Rectal cancer Neg Hx   . Stomach cancer Neg Hx    History  Sexual Activity  . Sexual activity: Not on file    Outpatient Encounter Prescriptions as of 08/01/2016    Medication Sig  . acetaminophen (TYLENOL) 325 MG tablet Take 650 mg by mouth every 6 (six) hours as needed for moderate pain.   .Marland Kitchenalendronate (FOSAMAX) 70 MG tablet Take 1 tablet (70 mg total) by mouth every 7 (seven) days. Take with a full glass of water on an empty stomach. (Patient taking differently: Take 70 mg by mouth every Monday. Take with a full glass of water on an empty stomach.)  . ALPRAZolam (XANAX) 0.5 MG tablet take 1 tablet by mouth three times a day if needed for STRESS  . amLODipine (NORVASC) 10 MG tablet take 1 tablet by mouth once daily  . Apoaequorin (PREVAGEN) 10 MG CAPS Take 1 capsule by mouth daily.  . Ascorbic Acid (VITAMIN C) 500 MG tablet Take 1,000 mg by mouth every evening.   .Marland Kitchenatorvastatin (LIPITOR) 40 MG tablet take 1 tablet by mouth once daily  . B Complex Vitamins (B COMPLEX-B12 PO) Take 1 capsule by mouth every evening.   . calcium-vitamin D (OSCAL WITH D) 500-200 MG-UNIT per tablet Take 1 tablet by mouth every evening.   . carboxymethylcellulose (REFRESH PLUS) 0.5 % SOLN Place 2 drops into both eyes daily.  . Coenzyme Q10 200 MG capsule Take 200 mg by mouth every evening.   . docusate sodium (COLACE) 100 MG capsule Take 1 capsule (100 mg total) by mouth 2 (two) times daily. While taking narcotic pain medicine.  . furosemide (LASIX) 40 MG tablet take 1 tablet by mouth twice a day  . losartan (COZAAR) 50 MG tablet take 1 tablet by mouth once daily  . Menthol, Topical Analgesic, (BLUE-EMU MAXIMUM STRENGTH EX) Apply 1 application topically 3 (three) times daily as needed (for knee pain/joint pain.).  .Marland Kitchenmetoprolol succinate (TOPROL-XL) 25 MG 24 hr tablet Take 1 tablet (25 mg total) by mouth 2 (two) times daily.  . Multiple Vitamins-Minerals (AIRBORNE PO) Take 1 tablet by mouth daily.  . Multiple Vitamins-Minerals (CENTRUM SILVER ULTRA WOMENS) TABS Take 1 tablet by mouth every evening.   . Omega-3 Fatty Acids (FISH OIL) 1200 MG CAPS Take 1,200 mg by mouth every  evening.   .Marland KitchenPROAIR HFA 108 (90 Base) MCG/ACT inhaler inhale 2 puffs INTO THE LUNGS every 4 hours if needed for wheezing or shortness of breath ( COUGH SHORTNESS OF BREATH AND WHEEZING)  . Rivaroxaban (XARELTO) 15 MG TABS tablet Take 1 tablet (15 mg total) by mouth daily with supper.  . sodium chloride (OCEAN) 0.65 % nasal spray Place 1 spray into the nose daily. After shower   No facility-administered encounter medications on file as of 08/01/2016.     Activities of Daily Living In your present state of health, do you have any difficulty performing  the following activities: 08/01/2016 02/19/2016  Hearing? N N  Vision? N N  Difficulty concentrating or making decisions? N N  Walking or climbing stairs? N Y  Dressing or bathing? N Y  Doing errands, shopping? N Y  Conservation officer, nature and eating ? N -  Using the Toilet? N -  In the past six months, have you accidently leaked urine? N -  Do you have problems with loss of bowel control? N -  Managing your Medications? N -  Managing your Finances? N -  Housekeeping or managing your Housekeeping? N -  Some recent data might be hidden    Patient Care Team: [name], MD as PCP - General (Family Medicine) Iran Planas, MD as Consulting Physician (Orthopedic Surgery) Latanya Maudlin, MD as Consulting Physician (Orthopedic Surgery) Danella Sensing, MD as Consulting Physician (Dermatology) Irene Shipper, MD as Consulting Physician (Gastroenterology) Debara Pickett Nadean Corwin, MD as Consulting Physician (Cardiology) Debara Pickett Nadean Corwin, MD as Consulting Physician (Cardiology) Wylene Simmer, MD as Consulting Physician (Orthopedic Surgery)    Assessment:    Physical assessment deferred to PCP.  Exercise Activities and Dietary recommendations Current Exercise Habits: Home exercise routine, Type of exercise: walking, Time (Minutes): 20, Frequency (Times/Week): 3, Weekly Exercise (Minutes/Week): 60, Exercise limited by: None identified   Diet (meal  preparation, eat out, water intake, caffeinated beverages, dairy products, fruits and vegetables): Drinks coffee and water  Breakfast: toast, eggs, bacon, coffee Lunch: sandwich Dinner: chicken/fish, rice, vegetables  Encouraged to continue Weight Watchers plan and increase activity as tolerated.   Goals    . Weight (lb) < 140 lb (63.5 kg)          Lose weight by continuing Weight Watchers and increasing activity.        Fall Risk Fall Risk  08/01/2016 05/25/2015 03/06/2015 06/18/2014  Falls in the past year? Yes No No No  Number falls in past yr: 1 - - -  Injury with Fall? Yes - - -  Follow up Falls prevention discussed - - -   Depression Screen PHQ 2/9 Scores 08/01/2016 05/25/2015 03/06/2015 10/25/2014  PHQ - 2 Score 0 0 0 0     Cognitive Function       Ad8 score reviewed for issues:  Issues making decisions: no  Less interest in hobbies / activities: no  Repeats questions, stories (family complaining): no  Trouble using ordinary gadgets (microwave, computer, phone): no  Forgets the month or year: no  Mismanaging finances: no  Remembering appts: no  Daily problems with thinking and/or memory: no Ad8 score is=0     Immunization History  Administered Date(s) Administered  . H1N1 01/07/2008  . Influenza Split 11/01/2010, 11/15/2011  . Influenza Whole 12/27/2006, 11/07/2007, 10/31/2008, 12/29/2009  . Influenza, High Dose Seasonal PF 10/17/2014  . Influenza,inj,Quad PF,36+ Mos 11/20/2013  . Influenza-Unspecified 09/30/2015  . Pneumococcal Conjugate-13 07/16/2013  . Pneumococcal Polysaccharide-23 11/07/2007  . Td 12/10/2008  . Zoster 04/05/2007   Screening Tests Health Maintenance  Topic Date Due  . COLONOSCOPY  05/30/2016  . INFLUENZA VACCINE  08/17/2016  . MAMMOGRAM  05/17/2017  . TETANUS/TDAP  12/11/2018  . DEXA SCAN  Completed  . PNA vac Low Risk Adult  Completed      Plan:    Bring a copy of your advance directives to your next office  visit.  Continue doing brain stimulating activities (puzzles, reading, adult coloring books, staying active) to keep memory sharp.   Discuss colonoscopy with Dr. Henrene Pastor  Shingles  vaccine  I have personally reviewed and noted the following in the patient's chart:   . Medical and social history . Use of alcohol, tobacco or illicit drugs  . Current medications and supplements . Functional ability and status . Nutritional status . Physical activity . Advanced directives . List of other physicians . Hospitalizations, surgeries, and ER visits in previous 12 months . Vitals . Screenings to include cognitive, depression, and falls . Referrals and appointments  In addition, I have reviewed and discussed with patient certain preventive protocols, quality metrics, and best practice recommendations. A written personalized care plan for preventive services as well as general preventive health recommendations were provided to patient.     Gerilyn Nestle, RN  08/01/2016

## 2016-08-01 ENCOUNTER — Ambulatory Visit (INDEPENDENT_AMBULATORY_CARE_PROVIDER_SITE_OTHER): Payer: Medicare Other

## 2016-08-01 VITALS — BP 128/76 | HR 79 | Ht 62.0 in | Wt 164.4 lb

## 2016-08-01 DIAGNOSIS — Z0000 Encounter for general adult medical examination without abnormal findings: Secondary | ICD-10-CM | POA: Diagnosis not present

## 2016-08-01 NOTE — Patient Instructions (Addendum)
Bring a copy of your advance directives to your next office visit.  Continue doing brain stimulating activities (puzzles, reading, adult coloring books, staying active) to keep memory sharp.   Discuss colonoscopy with Dr. Henrene Pastor  Shingles vaccine  Fall Prevention in the Home Falls can cause injuries. They can happen to people of all ages. There are many things you can do to make your home safe and to help prevent falls. What can I do on the outside of my home?  Regularly fix the edges of walkways and driveways and fix any cracks.  Remove anything that might make you trip as you walk through a door, such as a raised step or threshold.  Trim any bushes or trees on the path to your home.  Use bright outdoor lighting.  Clear any walking paths of anything that might make someone trip, such as rocks or tools.  Regularly check to see if handrails are loose or broken. Make sure that both sides of any steps have handrails.  Any raised decks and porches should have guardrails on the edges.  Have any leaves, snow, or ice cleared regularly.  Use sand or salt on walking paths during winter.  Clean up any spills in your garage right away. This includes oil or grease spills. What can I do in the bathroom?  Use night lights.  Install grab bars by the toilet and in the tub and shower. Do not use towel bars as grab bars.  Use non-skid mats or decals in the tub or shower.  If you need to sit down in the shower, use a plastic, non-slip stool.  Keep the floor dry. Clean up any water that spills on the floor as soon as it happens.  Remove soap buildup in the tub or shower regularly.  Attach bath mats securely with double-sided non-slip rug tape.  Do not have throw rugs and other things on the floor that can make you trip. What can I do in the bedroom?  Use night lights.  Make sure that you have a light by your bed that is easy to reach.  Do not use any sheets or blankets that are too  big for your bed. They should not hang down onto the floor.  Have a firm chair that has side arms. You can use this for support while you get dressed.  Do not have throw rugs and other things on the floor that can make you trip. What can I do in the kitchen?  Clean up any spills right away.  Avoid walking on wet floors.  Keep items that you use a lot in easy-to-reach places.  If you need to reach something above you, use a strong step stool that has a grab bar.  Keep electrical cords out of the way.  Do not use floor polish or wax that makes floors slippery. If you must use wax, use non-skid floor wax.  Do not have throw rugs and other things on the floor that can make you trip. What can I do with my stairs?  Do not leave any items on the stairs.  Make sure that there are handrails on both sides of the stairs and use them. Fix handrails that are broken or loose. Make sure that handrails are as long as the stairways.  Check any carpeting to make sure that it is firmly attached to the stairs. Fix any carpet that is loose or worn.  Avoid having throw rugs at the top or bottom of  the stairs. If you do have throw rugs, attach them to the floor with carpet tape.  Make sure that you have a light switch at the top of the stairs and the bottom of the stairs. If you do not have them, ask someone to add them for you. What else can I do to help prevent falls?  Wear shoes that: ? Do not have high heels. ? Have rubber bottoms. ? Are comfortable and fit you well. ? Are closed at the toe. Do not wear sandals.  If you use a stepladder: ? Make sure that it is fully opened. Do not climb a closed stepladder. ? Make sure that both sides of the stepladder are locked into place. ? Ask someone to hold it for you, if possible.  Clearly mark and make sure that you can see: ? Any grab bars or handrails. ? First and last steps. ? Where the edge of each step is.  Use tools that help you move  around (mobility aids) if they are needed. These include: ? Canes. ? Walkers. ? Scooters. ? Crutches.  Turn on the lights when you go into a dark area. Replace any light bulbs as soon as they burn out.  Set up your furniture so you have a clear path. Avoid moving your furniture around.  If any of your floors are uneven, fix them.  If there are any pets around you, be aware of where they are.  Review your medicines with your doctor. Some medicines can make you feel dizzy. This can increase your chance of falling. Ask your doctor what other things that you can do to help prevent falls. This information is not intended to replace advice given to you by your health care provider. Make sure you discuss any questions you have with your health care provider. Document Released: 10/30/2008 Document Revised: 06/11/2015 Document Reviewed: 02/07/2014 Elsevier Interactive Patient Education  2018 Saline Maintenance, Female Adopting a healthy lifestyle and getting preventive care can go a long way to promote health and wellness. Talk with your health care provider about what schedule of regular examinations is right for you. This is a good chance for you to check in with your provider about disease prevention and staying healthy. In between checkups, there are plenty of things you can do on your own. Experts have done a lot of research about which lifestyle changes and preventive measures are most likely to keep you healthy. Ask your health care provider for more information. Weight and diet Eat a healthy diet  Be sure to include plenty of vegetables, fruits, low-fat dairy products, and lean protein.  Do not eat a lot of foods high in solid fats, added sugars, or salt.  Get regular exercise. This is one of the most important things you can do for your health. ? Most adults should exercise for at least 150 minutes each week. The exercise should increase your heart rate and make you  sweat (moderate-intensity exercise). ? Most adults should also do strengthening exercises at least twice a week. This is in addition to the moderate-intensity exercise.  Maintain a healthy weight  Body mass index (BMI) is a measurement that can be used to identify possible weight problems. It estimates body fat based on height and weight. Your health care provider can help determine your BMI and help you achieve or maintain a healthy weight.  For females 22 years of age and older: ? A BMI below 18.5 is considered underweight. ?  A BMI of 18.5 to 24.9 is normal. ? A BMI of 25 to 29.9 is considered overweight. ? A BMI of 30 and above is considered obese.  Watch levels of cholesterol and blood lipids  You should start having your blood tested for lipids and cholesterol at 79 years of age, then have this test every 5 years.  You may need to have your cholesterol levels checked more often if: ? Your lipid or cholesterol levels are high. ? You are older than 79 years of age. ? You are at high risk for heart disease.  Cancer screening Lung Cancer  Lung cancer screening is recommended for adults 45-12 years old who are at high risk for lung cancer because of a history of smoking.  A yearly low-dose CT scan of the lungs is recommended for people who: ? Currently smoke. ? Have quit within the past 15 years. ? Have at least a 30-pack-year history of smoking. A pack year is smoking an average of one pack of cigarettes a day for 1 year.  Yearly screening should continue until it has been 15 years since you quit.  Yearly screening should stop if you develop a health problem that would prevent you from having lung cancer treatment.  Breast Cancer  Practice breast self-awareness. This means understanding how your breasts normally appear and feel.  It also means doing regular breast self-exams. Let your health care provider know about any changes, no matter how small.  If you are in your 20s  or 30s, you should have a clinical breast exam (CBE) by a health care provider every 1-3 years as part of a regular health exam.  If you are 29 or older, have a CBE every year. Also consider having a breast X-ray (mammogram) every year.  If you have a family history of breast cancer, talk to your health care provider about genetic screening.  If you are at high risk for breast cancer, talk to your health care provider about having an MRI and a mammogram every year.  Breast cancer gene (BRCA) assessment is recommended for women who have family members with BRCA-related cancers. BRCA-related cancers include: ? Breast. ? Ovarian. ? Tubal. ? Peritoneal cancers.  Results of the assessment will determine the need for genetic counseling and BRCA1 and BRCA2 testing.  Cervical Cancer Your health care provider may recommend that you be screened regularly for cancer of the pelvic organs (ovaries, uterus, and vagina). This screening involves a pelvic examination, including checking for microscopic changes to the surface of your cervix (Pap test). You may be encouraged to have this screening done every 3 years, beginning at age 52.  For women ages 24-65, health care providers may recommend pelvic exams and Pap testing every 3 years, or they may recommend the Pap and pelvic exam, combined with testing for human papilloma virus (HPV), every 5 years. Some types of HPV increase your risk of cervical cancer. Testing for HPV may also be done on women of any age with unclear Pap test results.  Other health care providers may not recommend any screening for nonpregnant women who are considered low risk for pelvic cancer and who do not have symptoms. Ask your health care provider if a screening pelvic exam is right for you.  If you have had past treatment for cervical cancer or a condition that could lead to cancer, you need Pap tests and screening for cancer for at least 20 years after your treatment. If Pap tests  have  been discontinued, your risk factors (such as having a new sexual partner) need to be reassessed to determine if screening should resume. Some women have medical problems that increase the chance of getting cervical cancer. In these cases, your health care provider may recommend more frequent screening and Pap tests.  Colorectal Cancer  This type of cancer can be detected and often prevented.  Routine colorectal cancer screening usually begins at 79 years of age and continues through 79 years of age.  Your health care provider may recommend screening at an earlier age if you have risk factors for colon cancer.  Your health care provider may also recommend using home test kits to check for hidden blood in the stool.  A small camera at the end of a tube can be used to examine your colon directly (sigmoidoscopy or colonoscopy). This is done to check for the earliest forms of colorectal cancer.  Routine screening usually begins at age 76.  Direct examination of the colon should be repeated every 5-10 years through 79 years of age. However, you may need to be screened more often if early forms of precancerous polyps or small growths are found.  Skin Cancer  Check your skin from head to toe regularly.  Tell your health care provider about any new moles or changes in moles, especially if there is a change in a mole's shape or color.  Also tell your health care provider if you have a mole that is larger than the size of a pencil eraser.  Always use sunscreen. Apply sunscreen liberally and repeatedly throughout the day.  Protect yourself by wearing long sleeves, pants, a wide-brimmed hat, and sunglasses whenever you are outside.  Heart disease, diabetes, and high blood pressure  High blood pressure causes heart disease and increases the risk of stroke. High blood pressure is more likely to develop in: ? People who have blood pressure in the high end of the normal range (130-139/85-89 mm  Hg). ? People who are overweight or obese. ? People who are African American.  If you are 65-37 years of age, have your blood pressure checked every 3-5 years. If you are 34 years of age or older, have your blood pressure checked every year. You should have your blood pressure measured twice-once when you are at a hospital or clinic, and once when you are not at a hospital or clinic. Record the average of the two measurements. To check your blood pressure when you are not at a hospital or clinic, you can use: ? An automated blood pressure machine at a pharmacy. ? A home blood pressure monitor.  If you are between 56 years and 61 years old, ask your health care provider if you should take aspirin to prevent strokes.  Have regular diabetes screenings. This involves taking a blood sample to check your fasting blood sugar level. ? If you are at a normal weight and have a low risk for diabetes, have this test once every three years after 79 years of age. ? If you are overweight and have a high risk for diabetes, consider being tested at a younger age or more often. Preventing infection Hepatitis B  If you have a higher risk for hepatitis B, you should be screened for this virus. You are considered at high risk for hepatitis B if: ? You were born in a country where hepatitis B is common. Ask your health care provider which countries are considered high risk. ? Your parents were born in  a high-risk country, and you have not been immunized against hepatitis B (hepatitis B vaccine). ? You have HIV or AIDS. ? You use needles to inject street drugs. ? You live with someone who has hepatitis B. ? You have had sex with someone who has hepatitis B. ? You get hemodialysis treatment. ? You take certain medicines for conditions, including cancer, organ transplantation, and autoimmune conditions.  Hepatitis C  Blood testing is recommended for: ? Everyone born from [birth year] through 1963. ? Anyone with known  risk factors for hepatitis C.  Sexually transmitted infections (STIs)  You should be screened for sexually transmitted infections (STIs) including gonorrhea and chlamydia if: ? You are sexually active and are younger than 79 years of age. ? You are older than 79 years of age and your health care provider tells you that you are at risk for this type of infection. ? Your sexual activity has changed since you were last screened and you are at an increased risk for chlamydia or gonorrhea. Ask your health care provider if you are at risk.  If you do not have HIV, but are at risk, it may be recommended that you take a prescription medicine daily to prevent HIV infection. This is called pre-exposure prophylaxis (PrEP). You are considered at risk if: ? You are sexually active and do not regularly use condoms or know the HIV status of your partner(s). ? You take drugs by injection. ? You are sexually active with a partner who has HIV.  Talk with your health care provider about whether you are at high risk of being infected with HIV. If you choose to begin PrEP, you should first be tested for HIV. You should then be tested every 3 months for as long as you are taking PrEP. Pregnancy  If you are premenopausal and you may become pregnant, ask your health care provider about preconception counseling.  If you may become pregnant, take 400 to 800 micrograms (mcg) of folic acid every day.  If you want to prevent pregnancy, talk to your health care provider about birth control (contraception). Osteoporosis and menopause  Osteoporosis is a disease in which the bones lose minerals and strength with aging. This can result in serious bone fractures. Your risk for osteoporosis can be identified using a bone density scan.  If you are 18 years of age or older, or if you are at risk for osteoporosis and fractures, ask your health care provider if you should be screened.  Ask your health care provider whether you  should take a calcium or vitamin D supplement to lower your risk for osteoporosis.  Menopause may have certain physical symptoms and risks.  Hormone replacement therapy may reduce some of these symptoms and risks. Talk to your health care provider about whether hormone replacement therapy is right for you. Follow these instructions at home:  Schedule regular health, dental, and eye exams.  Stay current with your immunizations.  Do not use any tobacco products including cigarettes, chewing tobacco, or electronic cigarettes.  If you are pregnant, do not drink alcohol.  If you are breastfeeding, limit how much and how often you drink alcohol.  Limit alcohol intake to no more than 1 drink per day for nonpregnant women. One drink equals 12 ounces of beer, 5 ounces of wine, or 1 ounces of hard liquor.  Do not use street drugs.  Do not share needles.  Ask your health care provider for help if you need support or information about  quitting drugs.  Tell your health care provider if you often feel depressed.  Tell your health care provider if you have ever been abused or do not feel safe at home. This information is not intended to replace advice given to you by your health care provider. Make sure you discuss any questions you have with your health care provider. Document Released: 07/19/2010 Document Revised: 06/11/2015 Document Reviewed: 10/07/2014 Elsevier Interactive Patient Education  [employer].

## 2016-08-03 NOTE — Progress Notes (Signed)
AWV reviewed and agree.  Signed:  Crissie Sickles, MD           08/03/2016

## 2016-08-11 ENCOUNTER — Ambulatory Visit: Payer: Medicare Other | Admitting: Podiatry

## 2016-08-12 ENCOUNTER — Ambulatory Visit: Payer: Medicare Other | Admitting: Internal Medicine

## 2016-08-12 ENCOUNTER — Telehealth: Payer: Self-pay | Admitting: Internal Medicine

## 2016-08-12 NOTE — Telephone Encounter (Signed)
No charge. 

## 2016-08-24 ENCOUNTER — Ambulatory Visit: Payer: Medicare Other | Admitting: Podiatry

## 2016-09-21 ENCOUNTER — Ambulatory Visit: Payer: Medicare Other | Admitting: Podiatry

## 2016-09-26 ENCOUNTER — Ambulatory Visit (INDEPENDENT_AMBULATORY_CARE_PROVIDER_SITE_OTHER): Payer: Medicare Other | Admitting: Family Medicine

## 2016-09-26 ENCOUNTER — Encounter: Payer: Self-pay | Admitting: Family Medicine

## 2016-09-26 VITALS — BP 120/70 | HR 62 | Temp 97.7°F | Resp 16 | Ht 62.0 in | Wt 164.5 lb

## 2016-09-26 DIAGNOSIS — J9811 Atelectasis: Secondary | ICD-10-CM | POA: Diagnosis not present

## 2016-09-26 DIAGNOSIS — Z0000 Encounter for general adult medical examination without abnormal findings: Secondary | ICD-10-CM

## 2016-09-26 DIAGNOSIS — J984 Other disorders of lung: Secondary | ICD-10-CM

## 2016-09-26 DIAGNOSIS — Z23 Encounter for immunization: Secondary | ICD-10-CM | POA: Diagnosis not present

## 2016-09-26 DIAGNOSIS — I480 Paroxysmal atrial fibrillation: Secondary | ICD-10-CM

## 2016-09-26 MED ORDER — ZOSTER VAC RECOMB ADJUVANTED 50 MCG/0.5ML IM SUSR: 0.5000 mL | Freq: Once | INTRAMUSCULAR | 1 refills | Status: AC

## 2016-09-26 MED ORDER — RIVAROXABAN 15 MG PO TABS: 15.0000 mg | ORAL_TABLET | Freq: Every day | ORAL | 1 refills | Status: DC

## 2016-09-26 NOTE — Progress Notes (Signed)
OFFICE VISIT  09/26/2016   CC:  Chief Complaint  Patient presents with  . Shortness of Breath    O2 was 93%-94% when checked by [facility] nurse  . Cough    HPI:    Patient is a 81 y.o. Caucasian female who presents for SOB + "[place] nurse told me to see PCP b/c my pulse ox was 93-94%". She told the nurse that she did have some stable/chronic mild SOB when she walks: nurse got nervous and told her to arrange this appt for a check.  Had some coughing the last few days but only when she is lying down.  Feels better today after taking cough med last few days.  No fevers.  No chest pain.  No dizziness.  No worsening of SOB/DOE. No LE swelling.  Currently denies any complaints at all.  Past Medical History:  Diagnosis Date  . Abnormal EKG approx 2008   Nuclear stress test neg;   . Anxiety    with panic  . Arthritis   . Autoimmune hepatitis ([place])   . Cataract    s/p surgery--lens implants  . Chronic renal insufficiency, stage II (mild) 12/04/2012   GFR 60s  . Cirrhosis ([place])   . Diverticulosis 2009 colonoscopy  . Fracture of radial shaft, left, closed 11/16/10   fell down flight of stairs  . History of kidney stones   . Hx of adenomatous colonic polyps 2002   surveillance colonoscopy 2009, +polypectomy done-tubular adenoma w/out high grade.  05/2013 tubular adenomas--recall 3 yrs  . Hyperlipidemia   . Hypertension   . Low TSH level 02/18/2016   T3 norm, T4 mildly elevated--suspected sick euthyroid syndrome.  Repeat labs recommended 4-6 wks, do thyroid u/s if still abnormal.  . NASH (nonalcoholic steatohepatitis)   . Osteopenia    DEXA 08/2010; repeat DEXA 02/2015 worse: fosamax started  . PAF (paroxysmal atrial fibrillation) ([place]) 02/2016   when in post-op for ankle surgery; spontaneously converted in hosp, seen by Dr. Debara Pickett in consultation--metoprolol rate control + xarelto recommended.  Metop d/c due to hypot.  Plan to cont xarelto indef due to CHAD-VASc score of 3.  . Peripheral edema    . Pneumonia 2015   hx  . Rheumatic fever     Past Surgical History:  Procedure Laterality Date  . APPENDECTOMY  1966   done during surgery for tubal pregnancy  . CATARACT EXTRACTION W/ INTRAOCULAR LENS IMPLANT  2013   bilat  . COLONOSCOPY W/ POLYPECTOMY  05/2013   +diverticulosis; recall 3 yrs (Dr. Henrene Pastor)  . DEXA  02/2015   T score -2.1 in both femoral necks; FRAX 10 yr risk of major osteoporotic fracture was 21%---fosamax started  . EYE SURGERY    . OPEN REDUCTION INTERNAL FIXATION (ORIF) TIBIA/FIBULA FRACTURE Left 02/18/2016   Procedure: OPEN REDUCTION INTERNAL FIXATION (ORIF) Right ankle trimalleolar fracture;  Surgeon: Wylene Simmer, MD;  Location: [facility];  Service: Orthopedics;  Laterality: Left;  requests 29mns  . ORIF RADIAL FRACTURE  11/18/10   left; s/p slip on slippery floor and fell  . TONSILLECTOMY    . TRANSTHORACIC ECHOCARDIOGRAM  02/18/2016   LVEF of 55-60%, mild AI and mild MR and normal biatrial size.      Outpatient Medications Prior to Visit  Medication Sig Dispense Refill  . acetaminophen (TYLENOL) 325 MG tablet Take 650 mg by mouth every 6 (six) hours as needed for moderate pain.     .Marland Kitchenalendronate (FOSAMAX) 70 MG tablet Take 1 tablet (70  mg total) by mouth every 7 (seven) days. Take with a full glass of water on an empty stomach. (Patient taking differently: Take 70 mg by mouth every Monday. Take with a full glass of water on an empty stomach.) 4 tablet 11  . ALPRAZolam (XANAX) 0.5 MG tablet take 1 tablet by mouth three times a day if needed for STRESS 90 tablet 5  . amLODipine (NORVASC) 10 MG tablet take 1 tablet by mouth once daily 90 tablet 1  . Apoaequorin (PREVAGEN) 10 MG CAPS Take 1 capsule by mouth daily.    . Ascorbic Acid (VITAMIN C) 500 MG tablet Take 1,000 mg by mouth every evening.     Marland Kitchen atorvastatin (LIPITOR) 40 MG tablet take 1 tablet by mouth once daily 90 tablet 1  . B Complex Vitamins (B COMPLEX-B12 PO) Take 1 capsule by mouth every evening.     .  calcium-vitamin D (OSCAL WITH D) 500-200 MG-UNIT per tablet Take 1 tablet by mouth every evening.     . carboxymethylcellulose (REFRESH PLUS) 0.5 % SOLN Place 2 drops into both eyes daily.    . Coenzyme Q10 200 MG capsule Take 200 mg by mouth every evening.     . docusate sodium (COLACE) 100 MG capsule Take 1 capsule (100 mg total) by mouth 2 (two) times daily. While taking narcotic pain medicine. 30 capsule 0  . furosemide (LASIX) 40 MG tablet take 1 tablet by mouth twice a day 180 tablet 0  . losartan (COZAAR) 50 MG tablet take 1 tablet by mouth once daily 90 tablet 1  . Menthol, Topical Analgesic, (BLUE-EMU MAXIMUM STRENGTH EX) Apply 1 application topically 3 (three) times daily as needed (for knee pain/joint pain.).    Marland Kitchen metoprolol succinate (TOPROL-XL) 25 MG 24 hr tablet Take 1 tablet (25 mg total) by mouth 2 (two) times daily. 180 tablet 3  . Multiple Vitamins-Minerals (AIRBORNE PO) Take 1 tablet by mouth daily.    . Multiple Vitamins-Minerals (CENTRUM SILVER ULTRA WOMENS) TABS Take 1 tablet by mouth every evening.     . Omega-3 Fatty Acids (FISH OIL) 1200 MG CAPS Take 1,200 mg by mouth every evening.     Marland Kitchen PROAIR HFA 108 (90 Base) MCG/ACT inhaler inhale 2 puffs INTO THE LUNGS every 4 hours if needed for wheezing or shortness of breath ( COUGH SHORTNESS OF BREATH AND WHEEZING) 1 Inhaler 1  . sodium chloride (OCEAN) 0.65 % nasal spray Place 1 spray into the nose daily. After shower    . Rivaroxaban (XARELTO) 15 MG TABS tablet Take 1 tablet (15 mg total) by mouth daily with supper. 90 tablet 1   No facility-administered medications prior to visit.     Allergies  Allergen Reactions  . Augmentin [Amoxicillin-Pot Clavulanate] Nausea And Vomiting    "projectile vomiting" Has patient had a PCN reaction causing immediate rash, facial/tongue/throat swelling, SOB or lightheadedness with hypotension:No Has patient had a PCN reaction causing severe rash involving mucus membranes or skin  necrosis:No Has patient had a PCN reaction that required hospitalization:No Has patient had a PCN reaction occurring within the last 10 years:Yes If all of the above answers are "NO", then may proceed with Cephalosporin use.     ROS As per HPI  PE: Blood pressure 120/70, pulse 62, temperature 97.7 F (36.5 C), temperature source Oral, resp. rate 16, height 5' 2"  (1.575 m), weight 164 lb 8 oz (74.6 kg), SpO2 94 %. Gen: Alert, well appearing.  Patient is oriented to person, place,  time, and situation. AFFECT: pleasant, lucid thought and speech. FVC:BSWH: no injection, icteris, swelling, or exudate.  EOMI, PERRLA. Mouth: lips without lesion/swelling.  Oral mucosa pink and moist. Oropharynx without erythema, exudate, or swelling.  Neck - No masses or thyromegaly or limitation in range of motion CV: RRR, no m/r/g.   LUNGS: CTA bilat, nonlabored resps, good aeration in all lung fields.  LABS:    Chemistry      Component Value Date/Time   NA 142 07/04/2016 0909   K 4.1 07/04/2016 0909   CL 106 07/04/2016 0909   CO2 29 07/04/2016 0909   BUN 19 07/04/2016 0909   CREATININE 0.92 07/04/2016 0909   CREATININE 0.92 12/07/2012 1122      Component Value Date/Time   CALCIUM 9.6 07/04/2016 0909   ALKPHOS 78 07/04/2016 0909   AST 15 07/04/2016 0909   ALT 12 07/04/2016 0909   BILITOT 0.8 07/04/2016 0909     Lab Results  Component Value Date   WBC 6.1 07/04/2016   HGB 14.5 07/04/2016   HCT 45.2 07/04/2016   MCV 88.7 07/04/2016   PLT 242.0 07/04/2016    02/19/16: DG chest portable: IMPRESSION: 1. Low lung volumes with mild basilar atelectasis and/or scarring again noted. No acute infiltrate.  2.  Stable cardiomegaly  IMPRESSION AND PLAN:  1) Oxygen sat 93-94% appropriate for asymptomatic 81 y/o pt with hx of mild bibasilar scarring and atelectasis. Reassured pt today.    2) PAF: pt in sinus today, rate controlled.  She requests RF of her xarelto so I ordered this today.  3)  Preventative health care: flu vaccine IM today, also gave rx of shingrix today.  An After Visit Summary was printed and given to the patient.  FOLLOW UP: Return for keep appt set for 01/04/17.  Signed:  Crissie Sickles, MD           09/26/2016

## 2016-09-29 ENCOUNTER — Ambulatory Visit: Payer: Medicare Other | Admitting: Internal Medicine

## 2016-09-29 ENCOUNTER — Telehealth: Payer: Self-pay | Admitting: Family Medicine

## 2016-09-29 MED ORDER — HYDROCODONE-HOMATROPINE 5-1.5 MG/5ML PO SYRP: 5.0000 mL | ORAL_SOLUTION | Freq: Three times a day (TID) | ORAL | 0 refills | Status: DC | PRN

## 2016-09-29 NOTE — Telephone Encounter (Signed)
Please advise. Thanks.  

## 2016-09-29 NOTE — Telephone Encounter (Signed)
Yes, ok to take hydrocodone cough syrup short term (1 week)--but if cough not improving significantly after 7d of this med then return for recheck.  Rx printed.

## 2016-09-29 NOTE — Telephone Encounter (Signed)
Pt advised and voiced understanding.   

## 2016-09-29 NOTE — Telephone Encounter (Signed)
Patient states she was previously given hydrocodone cough syrup when she was diagnosed with pneumonia.  She states since her ov this week she has tried otc meds but nothing seems to be helping with her cough.   She wants to know if pcp recommends her getting a new script for hydrocodone cough medication.   Pharmacy:  [facility] [place], [address] [phone] (Phone) [phone] (Fax)

## 2016-10-06 ENCOUNTER — Encounter: Payer: Self-pay | Admitting: Family Medicine

## 2016-10-06 ENCOUNTER — Ambulatory Visit (INDEPENDENT_AMBULATORY_CARE_PROVIDER_SITE_OTHER): Payer: Medicare Other | Admitting: Family Medicine

## 2016-10-06 VITALS — BP 138/90 | HR 66 | Temp 97.3°F | Resp 16 | Ht 62.0 in | Wt 163.5 lb

## 2016-10-06 DIAGNOSIS — J209 Acute bronchitis, unspecified: Secondary | ICD-10-CM | POA: Diagnosis not present

## 2016-10-06 NOTE — Progress Notes (Signed)
OFFICE VISIT  10/06/2016   CC:  Chief Complaint  Patient presents with  . Cough    HPI:    Patient is a 81 y.o. Caucasian female who presents for cough.  I saw her 10 days ago and she had just started having mild cough for a few days.  This was described as a PND cough, helped by hycodan so she could rest and I gave new rx for hycodan--no further meds.  She then began to feel very tired.  Her cough continued, esp when lying supine.  Occ coughing fit and this would briefly take her breath away, but no SOB otherwise and no DOE.  No fevers or chills.  Scratchy throat from lots of coughing.  No ST or HA.  Some rib cage discomfort from all the coughing. Taking airborn, zicam, and flonase, + hycodan. She improved regarding this symptom 3 d/a.  She had to cancel her trip to [place] and has to have a MD note stating she was ill in order to get a refund. She does say she feels 90% improved now.  No n/v/d. Appetite was down, but she did hydrate well.  Past Medical History:  Diagnosis Date  . Abnormal EKG approx 2008   Nuclear stress test neg;   . Anxiety    with panic  . Arthritis   . Autoimmune hepatitis ([place])   . Cataract    s/p surgery--lens implants  . Chronic renal insufficiency, stage II (mild) 12/04/2012   GFR 60s  . Cirrhosis ([place])   . Diverticulosis 2009 colonoscopy  . Fracture of radial shaft, left, closed 11/16/10   fell down flight of stairs  . History of kidney stones   . Hx of adenomatous colonic polyps 2002   surveillance colonoscopy 2009, +polypectomy done-tubular adenoma w/out high grade.  05/2013 tubular adenomas--recall 3 yrs  . Hyperlipidemia   . Hypertension   . Low TSH level 02/18/2016   T3 norm, T4 mildly elevated--suspected sick euthyroid syndrome.  Repeat labs recommended 4-6 wks, do thyroid u/s if still abnormal.  . NASH (nonalcoholic steatohepatitis)   . Osteopenia    DEXA 08/2010; repeat DEXA 02/2015 worse: fosamax started  . PAF (paroxysmal atrial  fibrillation) ([place]) 02/2016   when in post-op for ankle surgery; spontaneously converted in hosp, seen by Dr. Debara Pickett in consultation--metoprolol rate control + xarelto recommended.  Metop d/c due to hypot.  Plan to cont xarelto indef due to CHAD-VASc score of 3.  . Peripheral edema   . Pneumonia 2015   hx  . Rheumatic fever     Past Surgical History:  Procedure Laterality Date  . APPENDECTOMY  1966   done during surgery for tubal pregnancy  . CATARACT EXTRACTION W/ INTRAOCULAR LENS IMPLANT  2013   bilat  . COLONOSCOPY W/ POLYPECTOMY  05/2013   +diverticulosis; recall 3 yrs (Dr. Henrene Pastor)  . DEXA  02/2015   T score -2.1 in both femoral necks; FRAX 10 yr risk of major osteoporotic fracture was 21%---fosamax started  . EYE SURGERY    . OPEN REDUCTION INTERNAL FIXATION (ORIF) TIBIA/FIBULA FRACTURE Left 02/18/2016   Procedure: OPEN REDUCTION INTERNAL FIXATION (ORIF) Right ankle trimalleolar fracture;  Surgeon: Wylene Simmer, MD;  Location: [facility];  Service: Orthopedics;  Laterality: Left;  requests 81mns  . ORIF RADIAL FRACTURE  11/18/10   left; s/p slip on slippery floor and fell  . TONSILLECTOMY    . TRANSTHORACIC ECHOCARDIOGRAM  02/18/2016   LVEF of 55-60%, mild AI and mild  MR and normal biatrial size.      Outpatient Medications Prior to Visit  Medication Sig Dispense Refill  . acetaminophen (TYLENOL) 325 MG tablet Take 650 mg by mouth every 6 (six) hours as needed for moderate pain.     Marland Kitchen alendronate (FOSAMAX) 70 MG tablet Take 1 tablet (70 mg total) by mouth every 7 (seven) days. Take with a full glass of water on an empty stomach. (Patient taking differently: Take 70 mg by mouth every Monday. Take with a full glass of water on an empty stomach.) 4 tablet 11  . ALPRAZolam (XANAX) 0.5 MG tablet take 1 tablet by mouth three times a day if needed for STRESS 90 tablet 5  . amLODipine (NORVASC) 10 MG tablet take 1 tablet by mouth once daily 90 tablet 1  . Apoaequorin (PREVAGEN) 10 MG CAPS Take 1  capsule by mouth daily.    . Ascorbic Acid (VITAMIN C) 500 MG tablet Take 1,000 mg by mouth every evening.     Marland Kitchen atorvastatin (LIPITOR) 40 MG tablet take 1 tablet by mouth once daily 90 tablet 1  . B Complex Vitamins (B COMPLEX-B12 PO) Take 1 capsule by mouth every evening.     . calcium-vitamin D (OSCAL WITH D) 500-200 MG-UNIT per tablet Take 1 tablet by mouth every evening.     . carboxymethylcellulose (REFRESH PLUS) 0.5 % SOLN Place 2 drops into both eyes daily.    . Coenzyme Q10 200 MG capsule Take 200 mg by mouth every evening.     . docusate sodium (COLACE) 100 MG capsule Take 1 capsule (100 mg total) by mouth 2 (two) times daily. While taking narcotic pain medicine. 30 capsule 0  . furosemide (LASIX) 40 MG tablet take 1 tablet by mouth twice a day 180 tablet 0  . HYDROcodone-homatropine (HYCODAN) 5-1.5 MG/5ML syrup Take 5 mLs by mouth every 8 (eight) hours as needed for cough. 120 mL 0  . losartan (COZAAR) 50 MG tablet take 1 tablet by mouth once daily 90 tablet 1  . Menthol, Topical Analgesic, (BLUE-EMU MAXIMUM STRENGTH EX) Apply 1 application topically 3 (three) times daily as needed (for knee pain/joint pain.).    Marland Kitchen metoprolol succinate (TOPROL-XL) 25 MG 24 hr tablet Take 1 tablet (25 mg total) by mouth 2 (two) times daily. 180 tablet 3  . Multiple Vitamins-Minerals (AIRBORNE PO) Take 1 tablet by mouth daily.    . Multiple Vitamins-Minerals (CENTRUM SILVER ULTRA WOMENS) TABS Take 1 tablet by mouth every evening.     . Omega-3 Fatty Acids (FISH OIL) 1200 MG CAPS Take 1,200 mg by mouth every evening.     Marland Kitchen PROAIR HFA 108 (90 Base) MCG/ACT inhaler inhale 2 puffs INTO THE LUNGS every 4 hours if needed for wheezing or shortness of breath ( COUGH SHORTNESS OF BREATH AND WHEEZING) 1 Inhaler 1  . Rivaroxaban (XARELTO) 15 MG TABS tablet Take 1 tablet (15 mg total) by mouth daily with supper. 90 tablet 1  . sodium chloride (OCEAN) 0.65 % nasal spray Place 1 spray into the nose daily. After shower      No facility-administered medications prior to visit.     Allergies  Allergen Reactions  . Augmentin [Amoxicillin-Pot Clavulanate] Nausea And Vomiting    "projectile vomiting" Has patient had a PCN reaction causing immediate rash, facial/tongue/throat swelling, SOB or lightheadedness with hypotension:No Has patient had a PCN reaction causing severe rash involving mucus membranes or skin necrosis:No Has patient had a PCN reaction that required hospitalization:No  Has patient had a PCN reaction occurring within the last 10 years:Yes If all of the above answers are "NO", then may proceed with Cephalosporin use.     ROS As per HPI  PE: Blood pressure 138/90, pulse 66, temperature (!) 97.3 F (36.3 C), temperature source Oral, resp. rate 16, height 5' 2"  (1.575 m), weight 163 lb 8 oz (74.2 kg), SpO2 93 %. Gen: Alert, well appearing.  Patient is oriented to person, place, time, and situation. AFFECT: pleasant, lucid thought and speech. ENT: Ears: EACs clear, normal epithelium.  TMs with good light reflex and landmarks bilaterally.  Eyes: no injection, icteris, swelling, or exudate.  EOMI, PERRLA. Nose: no drainage or turbinate edema/swelling.  No injection or focal lesion.  Mouth: lips without lesion/swelling.  Oral mucosa pink and moist.  Dentition intact and without obvious caries or gingival swelling.  Oropharynx without erythema, exudate, or swelling.  Neck - No masses or thyromegaly or limitation in range of motion CV: RRR, no m/r/g.   LUNGS: CTA bilat, nonlabored resps, good aeration in all lung fields. EXT: 1+ pitting edema R LL, 2+ pitting edema L LL. SKIN: no rash.  LABS:  none  IMPRESSION AND PLAN:  Acute bronchitis, suspect viral etiology. She is 90% improved. Continue to treat symptomatically until feeling 100% again. Signs/symptoms to call or return for were reviewed and pt expressed understanding.Marland Kitchen  Spent 25 min with pt today, with >50% of this time spent in  counseling and care coordination regarding the above problems. Also filled out paper detailing this illness and recent office visits + debilitation that this caused--making her unable to travel. This was done so that she can get her money for the trip that she missed refunded.  An After Visit Summary was printed and given to the patient.  FOLLOW UP: Return if symptoms worsen or fail to improve.  Signed:  Crissie Sickles, MD           10/06/2016

## 2016-10-21 ENCOUNTER — Other Ambulatory Visit: Payer: Self-pay | Admitting: *Deleted

## 2016-10-21 MED ORDER — ALPRAZOLAM 0.5 MG PO TABS: ORAL_TABLET | ORAL | 5 refills | Status: DC

## 2016-10-21 NOTE — Telephone Encounter (Signed)
Rx faxed.   Pt advised and voiced understanding.

## 2016-10-21 NOTE — Telephone Encounter (Signed)
Pt called requesting refill. She stated that she has transferred to [facility] from [facility]. She stated that [facility] could only send 1RF for her alprazolam since it was a controlled. She stated that she is now down to 2 pills and is worried that she will run out. Please advise.Thansk.

## 2016-12-16 ENCOUNTER — Encounter: Payer: Self-pay | Admitting: Internal Medicine

## 2016-12-16 ENCOUNTER — Ambulatory Visit: Payer: Medicare Other | Admitting: Internal Medicine

## 2016-12-16 VITALS — BP 124/66 | HR 67 | Resp 98 | Ht 62.0 in | Wt 169.0 lb

## 2016-12-16 DIAGNOSIS — Z7901 Long term (current) use of anticoagulants: Secondary | ICD-10-CM

## 2016-12-16 DIAGNOSIS — I10 Essential (primary) hypertension: Secondary | ICD-10-CM | POA: Diagnosis not present

## 2016-12-16 DIAGNOSIS — I480 Paroxysmal atrial fibrillation: Secondary | ICD-10-CM | POA: Diagnosis not present

## 2016-12-16 MED ORDER — RIVAROXABAN 20 MG PO TABS: 20.0000 mg | ORAL_TABLET | Freq: Every day | ORAL | 11 refills | Status: DC

## 2016-12-16 NOTE — Patient Instructions (Addendum)
Your physician has recommended you make the following change in your medication:  -- STOP xarelto 52m  -- START xarelto 262mdaily  Your physician wants you to follow-up in: ONE YEAR with Dr. HiDebara PickettYou will receive a reminder letter in the mail two months in advance. If you don't receive a letter, please call our office to schedule the follow-up appointment.

## 2016-12-19 ENCOUNTER — Encounter: Payer: Self-pay | Admitting: Internal Medicine

## 2016-12-19 DIAGNOSIS — Z7901 Long term (current) use of anticoagulants: Secondary | ICD-10-CM | POA: Insufficient documentation

## 2016-12-19 NOTE — Progress Notes (Signed)
OFFICE NOTE  Chief Complaint:  Routine follow-up  Primary Care Physician: [name], MD  HPI:  Brenda Matthews is a 81 y.o. 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 After induction with anesthesia today, she was noted to go into a-fib with RVR which persists. She has no awareness of this. Risk factors include age, HTN and dyslipidemia. Mom had several strokes, but no known a-fib. Exam benign except irregularly irregular rhythm, post-operative ankle. Apparently had a stress test in 2008 which was negative. She was placed on Xarelto and a low-dose beta blocker. Unfortunately blood pressure was low and she was taken off of her medication. She spontaneously converted while in the hospital and I encouraged her to stay on the [place]. She returns today for follow-up. She reports that she is very anxious to get out of her cast. Hopefully soon she will be in a walking boot. She denies any bleeding problems on the Xarelto. She does not report any recurrent atrial fibrillation.  12/16/2016  [name] returns today for follow-up.  She denies any recurrent atrial fibrillation.  Since I last saw her she is done fairly well.  Blood pressure is normal today 124/66.  EKG shows sinus rhythm at 67, personally reviewed.  She remains anticoagulated for paroxysmal atrial fibrillation and chads vas score of 3.  She was started on anticoagulation while hospitalized by the orthopedist and was switched from the orthopedic dose of Xarelto to 15 mg daily.  I reviewed lab work, specifically, her renal function which is normal, and advised her that she should be on the full 20 mg dose of Xarelto.  PMHx:  Past Medical History:  Diagnosis Date  . Abnormal EKG approx 2008   Nuclear stress test neg;   . Anxiety    with panic  . Arthritis   . Autoimmune hepatitis ([place])   . Cataract    s/p surgery--lens implants  .  Chronic renal insufficiency, stage II (mild) 12/04/2012   GFR 60s  . Cirrhosis ([place])   . Diverticulosis 2009 colonoscopy  . Fracture of radial shaft, left, closed 11/16/10   fell down flight of stairs  . History of kidney stones   . Hx of adenomatous colonic polyps 2002   surveillance colonoscopy 2009, +polypectomy done-tubular adenoma w/out high grade.  05/2013 tubular adenomas--recall 3 yrs  . Hyperlipidemia   . Hypertension   . Low TSH level 02/18/2016   T3 norm, T4 mildly elevated--suspected sick euthyroid syndrome.  Repeat labs recommended 4-6 wks, do thyroid u/s if still abnormal.  . NASH (nonalcoholic steatohepatitis)   . Osteopenia    DEXA 08/2010; repeat DEXA 02/2015 worse: fosamax started  . PAF (paroxysmal atrial fibrillation) ([place]) 02/2016   when in post-op for ankle surgery; spontaneously converted in hosp, seen by Dr. Debara Pickett in consultation--metoprolol rate control + xarelto recommended.  Metop d/c due to hypot.  Plan to cont xarelto indef due to CHAD-VASc score of 3.  . Peripheral edema   . Pneumonia 2015   hx  . Rheumatic fever     Past Surgical History:  Procedure Laterality Date  . APPENDECTOMY  1966   done during surgery for tubal pregnancy  . CATARACT EXTRACTION W/ INTRAOCULAR LENS IMPLANT  2013   bilat  . COLONOSCOPY W/ POLYPECTOMY  05/2013   +diverticulosis; recall 3 yrs (Dr. Henrene Pastor)  . DEXA  02/2015   T score -2.1 in both  femoral necks; FRAX 10 yr risk of major osteoporotic fracture was 21%---fosamax started  . EYE SURGERY    . OPEN REDUCTION INTERNAL FIXATION (ORIF) TIBIA/FIBULA FRACTURE Left 02/18/2016   Procedure: OPEN REDUCTION INTERNAL FIXATION (ORIF) Right ankle trimalleolar fracture;  Surgeon: Wylene Simmer, MD;  Location: [facility];  Service: Orthopedics;  Laterality: Left;  requests 48mns  . ORIF RADIAL FRACTURE  11/18/10   left; s/p slip on slippery floor and fell  . TONSILLECTOMY    . TRANSTHORACIC ECHOCARDIOGRAM  02/18/2016   LVEF of 55-60%, mild AI and  mild MR and normal biatrial size.      FAMHx:  Family History  Problem Relation Age of Onset  . Heart disease Mother   . Heart disease Father   . Hypertension Brother   . Diabetes Sister   . Colon cancer Neg Hx   . Pancreatic cancer Neg Hx   . Rectal cancer Neg Hx   . Stomach cancer Neg Hx     SOCHx:   reports that  has never smoked. she has never used smokeless tobacco. She reports that she drinks alcohol. She reports that she does not use drugs.  ALLERGIES:  Allergies  Allergen Reactions  . Augmentin [Amoxicillin-Pot Clavulanate] Nausea And Vomiting    "projectile vomiting" Has patient had a PCN reaction causing immediate rash, facial/tongue/throat swelling, SOB or lightheadedness with hypotension:No Has patient had a PCN reaction causing severe rash involving mucus membranes or skin necrosis:No Has patient had a PCN reaction that required hospitalization:No Has patient had a PCN reaction occurring within the last 10 years:Yes If all of the above answers are "NO", then may proceed with Cephalosporin use.     ROS: Pertinent items noted in HPI and remainder of comprehensive ROS otherwise negative.  HOME MEDS: Current Outpatient Medications on File Prior to Visit  Medication Sig Dispense Refill  . acetaminophen (TYLENOL) 325 MG tablet Take 650 mg by mouth every 6 (six) hours as needed for moderate pain.     .Marland Kitchenalendronate (FOSAMAX) 70 MG tablet Take 1 tablet (70 mg total) by mouth every 7 (seven) days. Take with a full glass of water on an empty stomach. (Patient taking differently: Take 70 mg by mouth every Monday. Take with a full glass of water on an empty stomach.) 4 tablet 11  . ALPRAZolam (XANAX) 0.5 MG tablet take 1 tablet by mouth three times a day if needed for STRESS 90 tablet 5  . amLODipine (NORVASC) 10 MG tablet take 1 tablet by mouth once daily 90 tablet 1  . Apoaequorin (PREVAGEN) 10 MG CAPS Take 1 capsule by mouth daily.    . Ascorbic Acid (VITAMIN C) 500 MG  tablet Take 1,000 mg by mouth every evening.     .Marland Kitchenatorvastatin (LIPITOR) 40 MG tablet take 1 tablet by mouth once daily 90 tablet 1  . B Complex Vitamins (B COMPLEX-B12 PO) Take 1 capsule by mouth every evening.     . calcium-vitamin D (OSCAL WITH D) 500-200 MG-UNIT per tablet Take 1 tablet by mouth every evening.     . carboxymethylcellulose (REFRESH PLUS) 0.5 % SOLN Place 2 drops into both eyes daily.    . Coenzyme Q10 200 MG capsule Take 200 mg by mouth every evening.     . docusate sodium (COLACE) 100 MG capsule Take 1 capsule (100 mg total) by mouth 2 (two) times daily. While taking narcotic pain medicine. 30 capsule 0  . fluticasone (FLONASE) 50 MCG/ACT nasal spray Place 1  spray into both nostrils daily.    . furosemide (LASIX) 40 MG tablet take 1 tablet by mouth twice a day 180 tablet 0  . HYDROcodone-homatropine (HYCODAN) 5-1.5 MG/5ML syrup Take 5 mLs by mouth every 8 (eight) hours as needed for cough. 120 mL 0  . losartan (COZAAR) 50 MG tablet take 1 tablet by mouth once daily 90 tablet 1  . Menthol, Topical Analgesic, (BLUE-EMU MAXIMUM STRENGTH EX) Apply 1 application topically 3 (three) times daily as needed (for knee pain/joint pain.).    Marland Kitchen metoprolol succinate (TOPROL-XL) 25 MG 24 hr tablet Take 1 tablet (25 mg total) by mouth 2 (two) times daily. 180 tablet 3  . Multiple Vitamins-Minerals (AIRBORNE PO) Take 1 tablet by mouth daily.    . Multiple Vitamins-Minerals (CENTRUM SILVER ULTRA WOMENS) TABS Take 1 tablet by mouth every evening.     . Omega-3 Fatty Acids (FISH OIL) 1200 MG CAPS Take 1,200 mg by mouth every evening.     Marland Kitchen PROAIR HFA 108 (90 Base) MCG/ACT inhaler inhale 2 puffs INTO THE LUNGS every 4 hours if needed for wheezing or shortness of breath ( COUGH SHORTNESS OF BREATH AND WHEEZING) 1 Inhaler 1  . sodium chloride (OCEAN) 0.65 % nasal spray Place 1 spray into the nose daily. After shower     No current facility-administered medications on file prior to visit.      LABS/IMAGING: No results found for this or any previous visit (from the past 48 hour(s)). No results found.  WEIGHTS: Wt Readings from Last 3 Encounters:  12/16/16 169 lb (76.7 kg)  10/06/16 163 lb 8 oz (74.2 kg)  09/26/16 164 lb 8 oz (74.6 kg)    VITALS: BP 124/66   Pulse 67   Resp (!) 98   Ht 5' 2"  (1.575 m)   Wt 169 lb (76.7 kg) Comment: typically weights 164lb  LMP  (LMP Unknown)   BMI 30.91 kg/m   EXAM: General appearance: alert and no distress Neck: no carotid bruit and no JVD Lungs: clear to auscultation bilaterally Heart: regular rate and rhythm Abdomen: soft, non-tender; bowel sounds normal; no masses,  no organomegaly Extremities: Left lower leg in a cast Pulses: 2+ and symmetric Skin: Skin color, texture, turgor normal. No rashes or lesions Neurologic: Grossly normal Psych: Pleasant  EKG: Normal sinus rhythm 67, low voltage QRS, inferior Q waves-personally reviewed  ASSESSMENT: 1. Paroxysmal atrial fibrillation-CHADSVASC score of 3 2. Hypertension-controlled 3. Family history of stroke  PLAN: 1.   Ms. Fotopoulos does not appear to have had recurrent atrial fibrillation however has an elevated chads Vascor.  It is not clear whether this is postoperative AF or whether she would have recurrence.  At this point I would favor continuing her Xarelto.  Review of the dose shows that she is under dose to 15 mg daily.  It seems that she was started on this at discharge by the orthopedist.  We will switch her dose to 20 mg daily today and provide samples.  Follow-up with me annually or sooner as necessary.  [name], MD, [facility], [place] Director of the [facility] Attending Cardiologist  Direct Dial: [phone]  Fax: [phone]  Website:  [email or web address] Nicola Quesnell 12/19/2016, 1:53 PM

## 2017-01-04 ENCOUNTER — Encounter: Payer: Self-pay | Admitting: Family Medicine

## 2017-01-04 ENCOUNTER — Other Ambulatory Visit: Payer: Self-pay

## 2017-01-04 ENCOUNTER — Ambulatory Visit: Payer: Medicare Other | Admitting: Family Medicine

## 2017-01-04 VITALS — BP 145/67 | HR 67 | Temp 97.4°F | Resp 16 | Ht 62.0 in | Wt 168.0 lb

## 2017-01-04 DIAGNOSIS — I10 Essential (primary) hypertension: Secondary | ICD-10-CM | POA: Diagnosis not present

## 2017-01-04 DIAGNOSIS — I480 Paroxysmal atrial fibrillation: Secondary | ICD-10-CM | POA: Diagnosis not present

## 2017-01-04 DIAGNOSIS — N182 Chronic kidney disease, stage 2 (mild): Secondary | ICD-10-CM

## 2017-01-04 DIAGNOSIS — F419 Anxiety disorder, unspecified: Secondary | ICD-10-CM

## 2017-01-04 DIAGNOSIS — E7800 Pure hypercholesterolemia, unspecified: Secondary | ICD-10-CM

## 2017-01-04 LAB — BASIC METABOLIC PANEL
BUN: 29 mg/dL — ABNORMAL HIGH (ref 6–23)
CALCIUM: 9.9 mg/dL (ref 8.4–10.5)
CHLORIDE: 103 meq/L (ref 96–112)
CO2: 27 mEq/L (ref 19–32)
CREATININE: 1.04 mg/dL (ref 0.40–1.20)
GFR: 54.2 mL/min — AB (ref >60.00)
Glucose, Bld: 92 mg/dL (ref 70–99)
Potassium: 4 mEq/L (ref 3.5–5.1)
Sodium: 139 mEq/L (ref 135–145)

## 2017-01-04 NOTE — Progress Notes (Signed)
OFFICE VISIT  01/04/2017   CC:  Chief Complaint  Patient presents with  . Follow-up    RCI, pt is not fasting.    HPI:    Patient is a 81 y.o. Caucasian female who presents for f/u HTN, HLD, CRI II/II, and anxiety. BP higher here today than her normal of 130/70s.  Denies HA's, CP, SOB, or palpitations.  HLD: compliant with statin w/out side effects.   Anxiety: takes xanax prn: usually 1 per day, occ 2 per day.  CRI: no NSAIDs.  She hydrates well.  ROS: no melena, no nose bleeds, no hematochezia.  Past Medical History:  Diagnosis Date  . Abnormal EKG approx 2008   Nuclear stress test neg;   . Anxiety    with panic  . Arthritis   . Autoimmune hepatitis ([place])   . Cataract    s/p surgery--lens implants  . Chronic renal insufficiency, stage II (mild) 12/04/2012   GFR 60s  . Cirrhosis ([place])   . Diverticulosis 2009 colonoscopy  . Fracture of radial shaft, left, closed 11/16/10   fell down flight of stairs  . History of kidney stones   . Hx of adenomatous colonic polyps 2002   surveillance colonoscopy 2009, +polypectomy done-tubular adenoma w/out high grade.  05/2013 tubular adenomas--recall 3 yrs  . Hyperlipidemia   . Hypertension   . Low TSH level 02/18/2016   T3 norm, T4 mildly elevated--suspected sick euthyroid syndrome.  Repeat labs 06/2016: normal.  . NASH (nonalcoholic steatohepatitis)   . Osteopenia    DEXA 08/2010; repeat DEXA 02/2015 worse: fosamax started  . PAF (paroxysmal atrial fibrillation) ([place]) 02/2016   when in post-op for ankle surgery; spontaneously converted in hosp, seen by Dr. Debara Pickett in consultation--metoprolol rate control + xarelto recommended.  Metop d/c due to hypot.  Plan to cont xarelto 20 mg qd indef due to CHAD-VASc score of 3.  . Peripheral edema   . Pneumonia 2015   hx  . Rheumatic fever     Past Surgical History:  Procedure Laterality Date  . APPENDECTOMY  1966   done during surgery for tubal pregnancy  . CATARACT EXTRACTION W/  INTRAOCULAR LENS IMPLANT  2013   bilat  . COLONOSCOPY W/ POLYPECTOMY  05/2013   +diverticulosis; recall 3 yrs (Dr. Henrene Pastor)  . DEXA  02/2015   T score -2.1 in both femoral necks; FRAX 10 yr risk of major osteoporotic fracture was 21%---fosamax started  . EYE SURGERY    . OPEN REDUCTION INTERNAL FIXATION (ORIF) TIBIA/FIBULA FRACTURE Left 02/18/2016   Procedure: OPEN REDUCTION INTERNAL FIXATION (ORIF) Right ankle trimalleolar fracture;  Surgeon: Wylene Simmer, MD;  Location: [facility];  Service: Orthopedics;  Laterality: Left;  requests 54mns  . ORIF RADIAL FRACTURE  11/18/10   left; s/p slip on slippery floor and fell  . TONSILLECTOMY    . TRANSTHORACIC ECHOCARDIOGRAM  02/18/2016   LVEF of 55-60%, mild AI and mild MR and normal biatrial size.      Outpatient Medications Prior to Visit  Medication Sig Dispense Refill  . acetaminophen (TYLENOL) 325 MG tablet Take 650 mg by mouth every 6 (six) hours as needed for moderate pain.     .Marland Kitchenalendronate (FOSAMAX) 70 MG tablet Take 1 tablet (70 mg total) by mouth every 7 (seven) days. Take with a full glass of water on an empty stomach. (Patient taking differently: Take 70 mg by mouth every Monday. Take with a full glass of water on an empty stomach.) 4 tablet 11  .  ALPRAZolam (XANAX) 0.5 MG tablet take 1 tablet by mouth three times a day if needed for STRESS 90 tablet 5  . amLODipine (NORVASC) 10 MG tablet take 1 tablet by mouth once daily 90 tablet 1  . Apoaequorin (PREVAGEN) 10 MG CAPS Take 1 capsule by mouth daily.    . Ascorbic Acid (VITAMIN C) 500 MG tablet Take 1,000 mg by mouth every evening.     Marland Kitchen atorvastatin (LIPITOR) 40 MG tablet take 1 tablet by mouth once daily 90 tablet 1  . B Complex Vitamins (B COMPLEX-B12 PO) Take 1 capsule by mouth every evening.     . calcium-vitamin D (OSCAL WITH D) 500-200 MG-UNIT per tablet Take 1 tablet by mouth every evening.     . carboxymethylcellulose (REFRESH PLUS) 0.5 % SOLN Place 2 drops into both eyes daily.    .  Coenzyme Q10 200 MG capsule Take 200 mg by mouth every evening.     . docusate sodium (COLACE) 100 MG capsule Take 1 capsule (100 mg total) by mouth 2 (two) times daily. While taking narcotic pain medicine. 30 capsule 0  . fluticasone (FLONASE) 50 MCG/ACT nasal spray Place 1 spray into both nostrils daily.    . furosemide (LASIX) 40 MG tablet take 1 tablet by mouth twice a day 180 tablet 0  . losartan (COZAAR) 50 MG tablet take 1 tablet by mouth once daily 90 tablet 1  . Menthol, Topical Analgesic, (BLUE-EMU MAXIMUM STRENGTH EX) Apply 1 application topically 3 (three) times daily as needed (for knee pain/joint pain.).    Marland Kitchen metoprolol succinate (TOPROL-XL) 25 MG 24 hr tablet Take 1 tablet (25 mg total) by mouth 2 (two) times daily. 180 tablet 3  . Multiple Vitamins-Minerals (AIRBORNE PO) Take 1 tablet by mouth daily.    . Multiple Vitamins-Minerals (CENTRUM SILVER ULTRA WOMENS) TABS Take 1 tablet by mouth every evening.     . Omega-3 Fatty Acids (FISH OIL) 1200 MG CAPS Take 1,200 mg by mouth every evening.     Marland Kitchen PROAIR HFA 108 (90 Base) MCG/ACT inhaler inhale 2 puffs INTO THE LUNGS every 4 hours if needed for wheezing or shortness of breath ( COUGH SHORTNESS OF BREATH AND WHEEZING) 1 Inhaler 1  . rivaroxaban (XARELTO) 20 MG TABS tablet Take 1 tablet (20 mg total) by mouth daily with supper. 30 tablet 11  . sodium chloride (OCEAN) 0.65 % nasal spray Place 1 spray into the nose daily. After shower    . HYDROcodone-homatropine (HYCODAN) 5-1.5 MG/5ML syrup Take 5 mLs by mouth every 8 (eight) hours as needed for cough. (Patient not taking: Reported on 01/04/2017) 120 mL 0   No facility-administered medications prior to visit.     Allergies  Allergen Reactions  . Augmentin [Amoxicillin-Pot Clavulanate] Nausea And Vomiting    "projectile vomiting" Has patient had a PCN reaction causing immediate rash, facial/tongue/throat swelling, SOB or lightheadedness with hypotension:No Has patient had a PCN  reaction causing severe rash involving mucus membranes or skin necrosis:No Has patient had a PCN reaction that required hospitalization:No Has patient had a PCN reaction occurring within the last 10 years:Yes If all of the above answers are "NO", then may proceed with Cephalosporin use.     ROS As per HPI  PE: Blood pressure (!) 145/67, pulse 67, temperature (!) 97.4 F (36.3 C), temperature source Oral, resp. rate 16, height 5' 2"  (1.575 m), weight 168 lb (76.2 kg), SpO2 95 %. Gen: Alert, well appearing.  Patient is oriented to person,  place, time, and situation. AFFECT: pleasant, lucid thought and speech. CV: RRR, no m/r/g.   LUNGS: CTA bilat, nonlabored resps, good aeration in all lung fields. EXT: no pitting on L.  2 + pitting R ankle.  LABS:  Lab Results  Component Value Date   TSH 0.77 07/04/2016   Lab Results  Component Value Date   WBC 6.1 07/04/2016   HGB 14.5 07/04/2016   HCT 45.2 07/04/2016   MCV 88.7 07/04/2016   PLT 242.0 07/04/2016   Lab Results  Component Value Date   CREATININE 0.92 07/04/2016   BUN 19 07/04/2016   NA 142 07/04/2016   K 4.1 07/04/2016   CL 106 07/04/2016   CO2 29 07/04/2016   Lab Results  Component Value Date   ALT 12 07/04/2016   AST 15 07/04/2016   ALKPHOS 78 07/04/2016   BILITOT 0.8 07/04/2016   Lab Results  Component Value Date   CHOL 157 07/04/2016   Lab Results  Component Value Date   HDL 60.70 07/04/2016   Lab Results  Component Value Date   LDLCALC 71 07/04/2016   Lab Results  Component Value Date   TRIG 126.0 07/04/2016   Lab Results  Component Value Date   CHOLHDL 3 07/04/2016   IMPRESSION AND PLAN:  1) HTN: The current medical regimen is effective;  continue present plan and medications. BMET today.  2) HLD: tolerating statin.  Lipid panel excellent 6 mo ago.  Plan repeat in 6 mo.  3) CRI stage II/III:  Patient avoids NSAIDs, hydrates well. Lytes/cr today.  4) Chronic anxiety: doing well on  pretty regular dosing of xanax. Hx of adenomatous polyps: due for repeat 2018--has appt with GI soon to discuss this.  5) PAF: has had appropriate recent cardiology f/u. As of this time, plan is to continue xarelto 20 mg qd indefinitely. Continue metoprolol.  An After Visit Summary was printed and given to the patient.  FOLLOW UP:  46moRCI  Signed:  PCrissie Sickles MD           01/04/2017

## 2017-01-17 DIAGNOSIS — I5032 Chronic diastolic (congestive) heart failure: Secondary | ICD-10-CM

## 2017-01-17 HISTORY — DX: Chronic diastolic (congestive) heart failure: I50.32

## 2017-01-20 ENCOUNTER — Ambulatory Visit: Payer: Medicare Other | Admitting: Internal Medicine

## 2017-01-30 ENCOUNTER — Ambulatory Visit: Payer: Medicare Other | Admitting: Internal Medicine

## 2017-02-12 ENCOUNTER — Other Ambulatory Visit: Payer: Self-pay | Admitting: Family Medicine

## 2017-03-02 ENCOUNTER — Ambulatory Visit: Payer: Medicare Other | Admitting: Internal Medicine

## 2017-03-07 ENCOUNTER — Telehealth: Payer: Self-pay | Admitting: Family Medicine

## 2017-03-07 NOTE — Telephone Encounter (Signed)
Pt advised and voiced understanding.   

## 2017-03-07 NOTE — Telephone Encounter (Signed)
She is already on blood thinner (xarelto), so her risk of blood clot is VERY LOW. Reassure her that it is safe to go on her trip and tell her to have a great time and not worry about this.-thx

## 2017-03-07 NOTE — Telephone Encounter (Signed)
Please advise. Thanks.  

## 2017-03-07 NOTE — Telephone Encounter (Signed)
Copied from [place] [id number]. Topic: Quick Communication - See Telephone Encounter >> Mar 07, 2017  9:35 AM Bea Graff, NT wrote: CRM for notification. See Telephone encounter for: Pt calling and states that she is planning a trip to [place] and pt states that she has high blood pressure and will be on the flight for awhile even with a layover in [place]. She states she will wear compression hose and get up to move around when possible. She wants to know if it would be safe for her to go? She worries about blood clots.   03/07/17.

## 2017-03-21 ENCOUNTER — Other Ambulatory Visit: Payer: Self-pay | Admitting: Family Medicine

## 2017-03-29 ENCOUNTER — Other Ambulatory Visit: Payer: Self-pay | Admitting: Internal Medicine

## 2017-04-18 ENCOUNTER — Ambulatory Visit: Payer: Medicare Other | Admitting: Internal Medicine

## 2017-05-03 ENCOUNTER — Other Ambulatory Visit: Payer: Self-pay | Admitting: Family Medicine

## 2017-05-05 ENCOUNTER — Other Ambulatory Visit: Payer: Self-pay | Admitting: Family Medicine

## 2017-05-08 NOTE — Telephone Encounter (Signed)
[facility] [place]  RF request for alprazolam LOV: 01/04/17 Next ov: None Last written: 10/21/16 #90 w/ 5RF  Please advise. Thanks.

## 2017-05-08 NOTE — Telephone Encounter (Signed)
Rx faxed

## 2017-06-21 LAB — HM MAMMOGRAPHY

## 2017-06-22 ENCOUNTER — Encounter: Payer: Self-pay | Admitting: Family Medicine

## 2017-07-27 ENCOUNTER — Encounter: Payer: Self-pay | Admitting: Family Medicine

## 2017-07-27 ENCOUNTER — Ambulatory Visit: Payer: Medicare Other | Admitting: Family Medicine

## 2017-07-27 ENCOUNTER — Other Ambulatory Visit: Payer: Self-pay | Admitting: Family Medicine

## 2017-07-27 VITALS — BP 117/78 | HR 109 | Temp 97.6°F | Resp 16 | Ht 62.0 in | Wt 178.4 lb

## 2017-07-27 DIAGNOSIS — T887XXA Unspecified adverse effect of drug or medicament, initial encounter: Secondary | ICD-10-CM

## 2017-07-27 DIAGNOSIS — I4891 Unspecified atrial fibrillation: Secondary | ICD-10-CM | POA: Diagnosis not present

## 2017-07-27 DIAGNOSIS — R058 Other specified cough: Secondary | ICD-10-CM

## 2017-07-27 DIAGNOSIS — E7800 Pure hypercholesterolemia, unspecified: Secondary | ICD-10-CM | POA: Diagnosis not present

## 2017-07-27 DIAGNOSIS — R05 Cough: Secondary | ICD-10-CM

## 2017-07-27 DIAGNOSIS — I10 Essential (primary) hypertension: Secondary | ICD-10-CM | POA: Diagnosis not present

## 2017-07-27 LAB — LIPID PANEL
Cholesterol: 129 mg/dL (ref 0–200)
HDL: 47.3 mg/dL (ref >39.00)
LDL Cholesterol: 50 mg/dL (ref 0–99)
NONHDL: 82.18
Total CHOL/HDL Ratio: 3
Triglycerides: 160 mg/dL — ABNORMAL HIGH (ref 0.0–149.0)
VLDL: 32 mg/dL (ref 0.0–40.0)

## 2017-07-27 LAB — COMPREHENSIVE METABOLIC PANEL
ALK PHOS: 82 U/L (ref 39–117)
ALT: 18 U/L (ref 0–35)
AST: 17 U/L (ref 0–37)
Albumin: 4.2 g/dL (ref 3.5–5.2)
BUN: 19 mg/dL (ref 6–23)
CO2: 28 mEq/L (ref 19–32)
CREATININE: 1.17 mg/dL (ref 0.40–1.20)
Calcium: 9.5 mg/dL (ref 8.4–10.5)
Chloride: 106 mEq/L (ref 96–112)
GFR: 47.25 mL/min — AB (ref >60.00)
GLUCOSE: 115 mg/dL — AB (ref 70–99)
Potassium: 4.1 mEq/L (ref 3.5–5.1)
Sodium: 143 mEq/L (ref 135–145)
TOTAL PROTEIN: 6.9 g/dL (ref 6.0–8.3)
Total Bilirubin: 0.8 mg/dL (ref 0.2–1.2)

## 2017-07-27 MED ORDER — ALBUTEROL SULFATE HFA 108 (90 BASE) MCG/ACT IN AERS: INHALATION_SPRAY | RESPIRATORY_TRACT | 1 refills | Status: DC

## 2017-07-27 NOTE — Progress Notes (Signed)
OFFICE VISIT  08/02/2017   CC:  Chief Complaint  Patient presents with  . Cough    side effect of medications?     HPI:    Patient is a 82 y.o. Caucasian female who presents for respiratory symptoms. Dry cough, she thinks since she started losartan years ago--she got used to it, now seems to be worse since she got on metoprolol for a-fib 02/2016.  Describes intermittent brief wheezing when going up stairs and some SOB associated but this does not always happen. No fevers, no abnormal wt loss.  No hemoptysis. Proair helps her sx's-uses this twice a day.   Denies CP or fevers.  No malaise.  Energy level is fine, appetite good.  No ST or loss of voice. No palpitations or racing heart.  No dizziness.  HLD: tolerating atorva 12m qd w/out side effect. She eats a fairly good low fat, moderate carb diet.  Past Medical History:  Diagnosis Date  . Abnormal EKG approx 2008   Nuclear stress test neg;   . Anxiety    with panic  . Arthritis   . Autoimmune hepatitis ([facility]   . Cataract    s/p surgery--lens implants  . Chronic renal insufficiency, stage II (mild) 12/04/2012   GFR 60s  . Cirrhosis ([facility]   . Diverticulosis 2009 colonoscopy  . Fracture of radial shaft, left, closed 11/16/10   fell down flight of stairs  . History of kidney stones   . Hx of adenomatous colonic polyps 2002   surveillance colonoscopy 2009, +polypectomy done-tubular adenoma w/out high grade.  05/2013 tubular adenomas--recall 3 yrs  . Hyperlipidemia   . Hypertension   . Low TSH level 02/18/2016   T3 norm, T4 mildly elevated--suspected sick euthyroid syndrome.  Repeat labs 06/2016: normal.  . NASH (nonalcoholic steatohepatitis)   . Osteopenia    DEXA 08/2010; repeat DEXA 02/2015 worse: fosamax started  . PAF (paroxysmal atrial fibrillation) ([facility] [place] 02/[place]   when in post-op for ankle surgery; spontaneously converted in hosp, seen by Dr. HDebara Pickettin consultation--metoprolol rate control + xarelto recommended.  Metop  d/c due to hypot.  Plan to cont xarelto 20 mg qd indef due to CHAD-VASc score of 3.  . Peripheral edema   . Pneumonia 2015   hx  . Rheumatic fever     Past Surgical History:  Procedure Laterality Date  . APPENDECTOMY  1966   done during surgery for tubal pregnancy  . CATARACT EXTRACTION W/ INTRAOCULAR LENS IMPLANT  2013   bilat  . COLONOSCOPY W/ POLYPECTOMY  05/2013   +diverticulosis; recall 3 yrs (Dr. PHenrene Pastor  . DEXA  02/2015   T score -2.1 in both femoral necks; FRAX 10 yr risk of major osteoporotic fracture was 21%---fosamax started  . EYE SURGERY    . OPEN REDUCTION INTERNAL FIXATION (ORIF) TIBIA/FIBULA FRACTURE Left 02/18/2016   Procedure: OPEN REDUCTION INTERNAL FIXATION (ORIF) Right ankle trimalleolar fracture;  Surgeon: JWylene Simmer MD;  [facility]  Service: Orthopedics;  Laterality: Left;  requests 947ms  . ORIF RADIAL FRACTURE  11/18/10   left; s/p slip on slippery floor and fell  . TONSILLECTOMY    . TRANSTHORACIC ECHOCARDIOGRAM  02/18/2016   LVEF of 55-60%, mild AI and mild MR and normal biatrial size.      Outpatient Medications Prior to Visit  Medication Sig Dispense Refill  . acetaminophen (TYLENOL) 325 MG tablet Take 650 mg by mouth every 6 (six) hours as needed for moderate pain.     .Marland Kitchen  alendronate (FOSAMAX) 70 MG tablet Take 1 tablet (70 mg total) by mouth every 7 (seven) days. Take with a full glass of water on an empty stomach. (Patient taking differently: Take 70 mg by mouth every Monday. Take with a full glass of water on an empty stomach.) 4 tablet 11  . ALPRAZolam (XANAX) 0.5 MG tablet TAKE 1 TABLET BY MOUTH THREE TIMES DAILY IF NEEDED FOR STRESS 90 tablet 5  . amLODipine (NORVASC) 10 MG tablet TAKE 1 TABLET BY MOUTH EVERY DAY 90 tablet 1  . Apoaequorin (PREVAGEN) 10 MG CAPS Take 1 capsule by mouth daily.    . Ascorbic Acid (VITAMIN C) 500 MG tablet Take 1,000 mg by mouth every evening.     . B Complex Vitamins (B COMPLEX-B12 PO) Take 1 capsule by mouth  every evening.     . calcium-vitamin D (OSCAL WITH D) 500-200 MG-UNIT per tablet Take 1 tablet by mouth every evening.     . carboxymethylcellulose (REFRESH PLUS) 0.5 % SOLN Place 2 drops into both eyes daily.    . Coenzyme Q10 200 MG capsule Take 200 mg by mouth every evening.     . docusate sodium (COLACE) 100 MG capsule Take 1 capsule (100 mg total) by mouth 2 (two) times daily. While taking narcotic pain medicine. 30 capsule 0  . fluticasone (FLONASE) 50 MCG/ACT nasal spray Place 1 spray into both nostrils daily.    . furosemide (LASIX) 40 MG tablet TAKE 1 TABLET BY MOUTH TWICE DAILY 180 tablet 1  . losartan (COZAAR) 50 MG tablet take 1 tablet by mouth once daily 90 tablet 1  . Menthol, Topical Analgesic, (BLUE-EMU MAXIMUM STRENGTH EX) Apply 1 application topically 3 (three) times daily as needed (for knee pain/joint pain.).    Marland Kitchen metoprolol succinate (TOPROL-XL) 25 MG 24 hr tablet TAKE 1 TABLET BY MOUTH TWICE DAILY 180 tablet 3  . Multiple Vitamins-Minerals (AIRBORNE PO) Take 1 tablet by mouth daily.    . Multiple Vitamins-Minerals (CENTRUM SILVER ULTRA WOMENS) TABS Take 1 tablet by mouth every evening.     . Omega-3 Fatty Acids (FISH OIL) 1200 MG CAPS Take 1,200 mg by mouth every evening.     . rivaroxaban (XARELTO) 20 MG TABS tablet Take 1 tablet (20 mg total) by mouth daily with supper. 30 tablet 11  . sodium chloride (OCEAN) 0.65 % nasal spray Place 1 spray into the nose daily. After shower    . atorvastatin (LIPITOR) 40 MG tablet take 1 tablet by mouth once daily 90 tablet 1  . PROAIR HFA 108 (90 Base) MCG/ACT inhaler INHALE 2 PUFFS INTO THE LUNGS EVERY 4 HOURS IF NEEDED FOR WHEEZING OR SHORTNESS OF BREATH(COUGH SHORTNESS OF BREATH AND WHEEZING) 8.5 g 1   No facility-administered medications prior to visit.     Allergies  Allergen Reactions  . Augmentin [Amoxicillin-Pot Clavulanate] Nausea And Vomiting    "projectile vomiting" Has patient had a PCN reaction causing immediate rash,  facial/tongue/throat swelling, SOB or lightheadedness with hypotension:No Has patient had a PCN reaction causing severe rash involving mucus membranes or skin necrosis:No Has patient had a PCN reaction that required hospitalization:No Has patient had a PCN reaction occurring within the last 10 years:Yes If all of the above answers are "NO", then may proceed with Cephalosporin use.     ROS As per HPI  PE: Blood pressure 117/78, pulse (!) 109, temperature 97.6 F (36.4 C), temperature source Oral, resp. rate 16, height 5' 2"  (1.575 m), weight 178 lb  6 oz (80.9 kg), SpO2 93 %. Body mass index is 32.63 kg/m.  Gen: Alert, well appearing.  Patient is oriented to person, place, time, and situation. AFFECT: pleasant, lucid thought and speech. CV: Irreg irreg, slight tachy no m/r/g    LABS:    Chemistry      Component Value Date/Time   NA 143 07/27/2017 0854   K 4.1 07/27/2017 0854   CL 106 07/27/2017 0854   CO2 28 07/27/2017 0854   BUN 19 07/27/2017 0854   CREATININE 1.17 07/27/2017 0854   CREATININE 0.92 12/07/2012 1122      Component Value Date/Time   CALCIUM 9.5 07/27/2017 0854   ALKPHOS 82 07/27/2017 0854   AST 17 07/27/2017 0854   ALT 18 07/27/2017 0854   BILITOT 0.8 07/27/2017 0854     Lab Results  Component Value Date   WBC 8.5 08/01/2017   HGB 13.9 08/01/2017   HCT 42.5 08/01/2017   MCV 90.1 08/01/2017   PLT 223.0 08/01/2017   Lab Results  Component Value Date   CHOL 129 07/27/2017   HDL 47.30 07/27/2017   LDLCALC 50 07/27/2017   TRIG 160.0 (H) 07/27/2017   CHOLHDL 3 07/27/2017   Lab Results  Component Value Date   TSH 0.77 07/04/2016    IMPRESSION AND PLAN:  1) Dry cough: suspect upper airway cough secondary to her ARB and BB.  She says this is simply a nuisance and she does not want to change anything with meds at this time. Just seeking reassurance. An After Visit Summary was printed and given to the patient.  2) HLD: due for repeat FLP---return  for fasting lipid panel and CMET at her earliest convenience.  Spent 25 min with pt today, with >50% of this time spent in counseling and care coordination regarding the above problems.  An After Visit Summary was printed and given to the patient.  FOLLOW UP: Return in about 3 months (around 10/27/2017) for annual CPE (fasting).  Signed:  Crissie Sickles, MD           08/02/2017

## 2017-07-31 ENCOUNTER — Ambulatory Visit: Payer: Self-pay

## 2017-07-31 NOTE — Telephone Encounter (Signed)
Incoming call from patient with complaint of SOB that comes and goes.  Patient states started gradually. Patient states that wheezing occurs sometimes.  Heart rate was 88 at the time of telephone encounter. Patient states this is the first time that this has occurred.  Denies hx of heart attack or again.  Denies asthma, or pulmonary disease.  Doesn't know know what may be causing this .  Denies phlegm. Has not been out of the country. Provided care advice patient voiced understanding.   Appointment schedule for  Tomorrow Tuesday August 01, 2017 @ 0915 with Howard Pouch, MD. Patient voiced understanding.   Reason for Disposition . [1] MILD longstanding difficulty breathing AND [2]  SAME as normal  Answer Assessment - Initial Assessment Questions 1. RESPIRATORY STATUS: "Describe your breathing?" (e.g., wheezing, shortness of breath, unable to speak, severe coughing)       SOB,  2. ONSET: "When did this breathing problem begin?"      Gradually  3. PATTERN "Does the difficult breathing come and go, or has it been constant since it started?"      Comes and goes 4. SEVERITY: "How bad is your breathing?" (e.g., mild, moderate, severe)    - MILD: No SOB at rest, mild SOB with walking, speaks normally in sentences, can lay down, no retractions, pulse < 100. Pulse was 88   - MODERATE: SOB at rest, SOB with minimal exertion and prefers to sit, cannot lie down flat, speaks in phrases, mild retractions, audible wheezing, pulse 100-120.    - SEVERE: Very SOB at rest, speaks in single words, struggling to breathe, sitting hunched forward, retractions, pulse > 120     Sometimes alittle wheezing  Moderate  5. RECURRENT SYMPTOM: "Have you had difficulty breathing before?" If so, ask: "When was the last time?" and "What happened that time?"      No  6. CARDIAC HISTORY: "Do you have any history of heart disease?" (e.g., heart attack, angina, bypass surgery, angioplasty)      no 7. LUNG HISTORY: "Do you have any  history of lung disease?"  (e.g., pulmonary embolus, asthma, emphysema)     no 8. CAUSE: "What do you think is causing the breathing problem?"      no 9. OTHER SYMPTOMS: "Do you have any other symptoms? (e.g., dizziness, runny nose, cough, chest pain, fever)    No [place] 11. TRAVEL: "Have you traveled out of the country in the last month?" (e.g., travel history, exposures)       na  Protocols used: BREATHING DIFFICULTY-A-AH

## 2017-07-31 NOTE — Telephone Encounter (Signed)
Appointment scheduled for 08/01/17 by triage nurse.

## 2017-08-01 ENCOUNTER — Ambulatory Visit: Payer: Medicare Other | Admitting: Family Medicine

## 2017-08-01 ENCOUNTER — Telehealth: Payer: Self-pay | Admitting: Family Medicine

## 2017-08-01 ENCOUNTER — Ambulatory Visit (INDEPENDENT_AMBULATORY_CARE_PROVIDER_SITE_OTHER)
Admission: RE | Admit: 2017-08-01 | Discharge: 2017-08-01 | Disposition: A | Discharge status: Home / Self Care | Payer: Medicare Other | Source: Ambulatory Visit | Attending: Family Medicine | Admitting: Family Medicine

## 2017-08-01 ENCOUNTER — Encounter: Payer: Self-pay | Admitting: Family Medicine

## 2017-08-01 VITALS — BP 125/83 | HR 114 | Temp 98.1°F | Resp 24 | Ht 62.0 in | Wt 180.0 lb

## 2017-08-01 DIAGNOSIS — R059 Cough, unspecified: Secondary | ICD-10-CM

## 2017-08-01 DIAGNOSIS — R0609 Other forms of dyspnea: Secondary | ICD-10-CM

## 2017-08-01 DIAGNOSIS — R05 Cough: Secondary | ICD-10-CM

## 2017-08-01 LAB — CBC
HCT: 42.5 % (ref 36.0–46.0)
Hemoglobin: 13.9 g/dL (ref 12.0–15.0)
MCHC: 32.6 g/dL (ref 30.0–36.0)
MCV: 90.1 fl (ref 78.0–100.0)
Platelets: 223 10*3/uL (ref 150.0–400.0)
RBC: 4.72 Mil/uL (ref 3.87–5.11)
RDW: 14.9 % (ref 11.5–15.5)
WBC: 8.5 10*3/uL (ref 4.0–10.5)

## 2017-08-01 LAB — BRAIN NATRIURETIC PEPTIDE: PRO B NATRI PEPTIDE: 631 pg/mL — AB (ref 0.0–100.0)

## 2017-08-01 MED ORDER — BENZONATATE 200 MG PO CAPS: 200.0000 mg | ORAL_CAPSULE | Freq: Two times a day (BID) | ORAL | 0 refills | Status: DC

## 2017-08-01 MED ORDER — DOXYCYCLINE HYCLATE 100 MG PO TABS: 100.0000 mg | ORAL_TABLET | Freq: Two times a day (BID) | ORAL | 0 refills | Status: DC

## 2017-08-01 NOTE — Telephone Encounter (Signed)
Please inform patient the following information: Her labs and xray reading indicate mild fluid overload/CHF appearance.    - Continue abx, just to be safe since she has an upcoming trip.    - Ask her if she is taking the lasix (water pill/diuretic) 1 pill twice a day. If she is ALREADY taking twice a day, I want her to double the morning dose and still take the afternoon dose for the next 3 days--> again- she needs to see her PCP on Friday (he only has one opening). schedule this while talking to her if you have not already.

## 2017-08-01 NOTE — Progress Notes (Signed)
Brenda Matthews , [birth year], 82 y.o., female MRN: [id number] Patient Care Team    Relationship Specialty Notifications Start End  McGowen, Adrian Blackwater, MD PCP - General Family Medicine  02/24/11   Iran Planas, MD Consulting Physician Orthopedic Surgery  02/24/11   Latanya Maudlin, MD Consulting Physician Orthopedic Surgery  08/12/13   Danella Sensing, MD Consulting Physician Dermatology  03/06/15   Irene Shipper, MD Consulting Physician Gastroenterology  05/25/15   [name], MD Consulting Physician Cardiology  03/26/16   [name], MD Consulting Physician Cardiology  04/05/16   Wylene Simmer, MD Consulting Physician Orthopedic Surgery  08/01/16     Chief Complaint  Patient presents with  . Shortness of Breath    witg exertion and non productive cough      Subjective: Pt presents for an OV with complaints of dry cough  of worsening over the last 3 wees duration.  Associated symptoms include Shortness of breath with climbing stairs. She states the SOB was "horrible" last night and she was waking every 2 hours last night and "could not get air." She states when she climbs the stairs she has to rest or lean over the bannister, because she becomes short of breath. She states the SOB improves if she rests. She denies chest pain,nausea, diaphoresis during those episodes. She denies lower ext edema. She denies fever, chills or headache.  Sig hx: HTN, HLD, CKD2, PAF, h/o bronchopneumonia.   Pt has tried albuterol  to ease their symptoms, which she does not feel it helps.   Depression screen [facility] 2/9 08/01/2016 05/25/2015 03/06/2015 10/25/2014 06/18/2014  Decreased Interest 0 0 0 0 0  Down, Depressed, Hopeless 0 0 0 0 0  PHQ - 2 Score 0 0 0 0 0    Allergies  Allergen Reactions  . Augmentin [Amoxicillin-Pot Clavulanate] Nausea And Vomiting    "projectile vomiting" Has patient had a PCN reaction causing immediate rash, facial/tongue/throat swelling, SOB or lightheadedness with hypotension:No Has  patient had a PCN reaction causing severe rash involving mucus membranes or skin necrosis:No Has patient had a PCN reaction that required hospitalization:No Has patient had a PCN reaction occurring within the last 10 years:Yes If all of the above answers are "NO", then may proceed with Cephalosporin use.    Social History   Tobacco Use  . Smoking status: Never Smoker  . Smokeless tobacco: Never Used  Substance Use Topics  . Alcohol use: Yes    Comment: occ   Past Medical History:  Diagnosis Date  . Abnormal EKG approx 2008   Nuclear stress test neg;   . Anxiety    with panic  . Arthritis   . Autoimmune hepatitis ([place])   . Cataract    s/p surgery--lens implants  . Chronic renal insufficiency, stage II (mild) 12/04/2012   GFR 60s  . Cirrhosis ([place])   . Diverticulosis 2009 colonoscopy  . Fracture of radial shaft, left, closed 11/16/10   fell down flight of stairs  . History of kidney stones   . Hx of adenomatous colonic polyps 2002   surveillance colonoscopy 2009, +polypectomy done-tubular adenoma w/out high grade.  05/2013 tubular adenomas--recall 3 yrs  . Hyperlipidemia   . Hypertension   . Low TSH level 02/18/2016   T3 norm, T4 mildly elevated--suspected sick euthyroid syndrome.  Repeat labs 06/2016: normal.  . NASH (nonalcoholic steatohepatitis)   . Osteopenia    DEXA 08/2010; repeat DEXA 02/2015 worse: fosamax started  . PAF (paroxysmal  atrial fibrillation) ([place]) 02/2016   when in post-op for ankle surgery; spontaneously converted in hosp, seen by Dr. Debara Pickett in consultation--metoprolol rate control + xarelto recommended.  Metop d/c due to hypot.  Plan to cont xarelto 20 mg qd indef due to CHAD-VASc score of 3.  . Peripheral edema   . Pneumonia 2015   hx  . Rheumatic fever    Past Surgical History:  Procedure Laterality Date  . APPENDECTOMY  1966   done during surgery for tubal pregnancy  . CATARACT EXTRACTION W/ INTRAOCULAR LENS IMPLANT  2013   bilat  . COLONOSCOPY  W/ POLYPECTOMY  05/2013   +diverticulosis; recall 3 yrs (Dr. Henrene Pastor)  . DEXA  02/2015   T score -2.1 in both femoral necks; FRAX 10 yr risk of major osteoporotic fracture was 21%---fosamax started  . EYE SURGERY    . OPEN REDUCTION INTERNAL FIXATION (ORIF) TIBIA/FIBULA FRACTURE Left 02/18/2016   Procedure: OPEN REDUCTION INTERNAL FIXATION (ORIF) Right ankle trimalleolar fracture;  Surgeon: Wylene Simmer, MD;  Location: [facility];  Service: Orthopedics;  Laterality: Left;  requests 67mns  . ORIF RADIAL FRACTURE  11/18/10   left; s/p slip on slippery floor and fell  . TONSILLECTOMY    . TRANSTHORACIC ECHOCARDIOGRAM  02/18/2016   LVEF of 55-60%, mild AI and mild MR and normal biatrial size.     Family History  Problem Relation Age of Onset  . Heart disease Mother   . Heart disease Father   . Hypertension Brother   . Diabetes Sister   . Colon cancer Neg Hx   . Pancreatic cancer Neg Hx   . Rectal cancer Neg Hx   . Stomach cancer Neg Hx    Allergies as of 08/01/2017      Reactions   Augmentin [amoxicillin-pot Clavulanate] Nausea And Vomiting   "projectile vomiting" Has patient had a PCN reaction causing immediate rash, facial/tongue/throat swelling, SOB or lightheadedness with hypotension:No Has patient had a PCN reaction causing severe rash involving mucus membranes or skin necrosis:No Has patient had a PCN reaction that required hospitalization:No Has patient had a PCN reaction occurring within the last 10 years:Yes If all of the above answers are "NO", then may proceed with Cephalosporin use.      Medication List        Accurate as of 08/01/17  9:49 AM. Always use your most recent med list.          acetaminophen 325 MG tablet Commonly known as:  TYLENOL Take 650 mg by mouth every 6 (six) hours as needed for moderate pain.   albuterol 108 (90 Base) MCG/ACT inhaler Commonly known as:  PROAIR HFA INHALE 2 PUFFS INTO THE LUNGS EVERY 4 HOURS IF NEEDED FOR WHEEZING OR SHORTNESS OF  BREATH(COUGH SHORTNESS OF BREATH AND WHEEZING)   alendronate 70 MG tablet Commonly known as:  FOSAMAX Take 1 tablet (70 mg total) by mouth every 7 (seven) days. Take with a full glass of water on an empty stomach.   ALPRAZolam 0.5 MG tablet Commonly known as:  XANAX TAKE 1 TABLET BY MOUTH THREE TIMES DAILY IF NEEDED FOR STRESS   amLODipine 10 MG tablet Commonly known as:  NORVASC TAKE 1 TABLET BY MOUTH EVERY DAY   atorvastatin 40 MG tablet Commonly known as:  LIPITOR TAKE 1 TABLET BY MOUTH ONCE DAILY   B COMPLEX-B12 PO Take 1 capsule by mouth every evening.   BLUE-EMU MAXIMUM STRENGTH EX Apply 1 application topically 3 (three) times daily as needed (  for knee pain/joint pain.).   calcium-vitamin D 500-200 MG-UNIT tablet Commonly known as:  OSCAL WITH D Take 1 tablet by mouth every evening.   carboxymethylcellulose 0.5 % Soln Commonly known as:  REFRESH PLUS Place 2 drops into both eyes daily.   CENTRUM SILVER ULTRA WOMENS Tabs Take 1 tablet by mouth every evening.   AIRBORNE PO Take 1 tablet by mouth daily.   Coenzyme Q10 200 MG capsule Take 200 mg by mouth every evening.   docusate sodium 100 MG capsule Commonly known as:  COLACE Take 1 capsule (100 mg total) by mouth 2 (two) times daily. While taking narcotic pain medicine.   Fish Oil 1200 MG Caps Take 1,200 mg by mouth every evening.   fluticasone 50 MCG/ACT nasal spray Commonly known as:  FLONASE Place 1 spray into both nostrils daily.   furosemide 40 MG tablet Commonly known as:  LASIX TAKE 1 TABLET BY MOUTH TWICE DAILY   losartan 50 MG tablet Commonly known as:  COZAAR take 1 tablet by mouth once daily   metoprolol succinate 25 MG 24 hr tablet Commonly known as:  TOPROL-XL TAKE 1 TABLET BY MOUTH TWICE DAILY   PREVAGEN 10 MG Caps Generic drug:  Apoaequorin Take 1 capsule by mouth daily.   rivaroxaban 20 MG Tabs tablet Commonly known as:  XARELTO Take 1 tablet (20 mg total) by mouth daily with  supper.   sodium chloride 0.65 % nasal spray Commonly known as:  OCEAN Place 1 spray into the nose daily. After shower   vitamin C 500 MG tablet Commonly known as:  ASCORBIC ACID Take 1,000 mg by mouth every evening.       All past medical history, surgical history, allergies, family history, immunizations andmedications were updated in the EMR today and reviewed under the history and medication portions of their EMR.     ROS: Negative, with the exception of above mentioned in HPI   Objective:  BP 125/83 (BP Location: Left Arm, Patient Position: Sitting, Cuff Size: Large)   Pulse (!) 114   Temp 98.1 F (36.7 C)   Resp (!) 24   Ht 5' 2"  (1.575 m)   Wt 180 lb (81.6 kg)   LMP  (LMP Unknown)   SpO2 93%   BMI 32.92 kg/m  Body mass index is 32.92 kg/m. Gen: Afebrile. No acute distress. Nontoxic in appearance, well developed, well nourished.  HENT: AT. [place]. Bilateral TM visualized without erythema or buldging. MMM, no oral lesions. Bilateral nares without erythema, swelling or drainage. Throat without erythema or exudates. Moderate cough and hoarseness present.  Eyes:Pupils Equal Round Reactive to light, Extraocular movements intact,  Conjunctiva without redness, discharge or icterus. Neck/lymp/endocrine: Supple, no lymphadenopathy CV: irregularly irregular , trace edema Chest: mild crackle RLL, no wheezing. Good air movement. Normal resp effort.  Abd: Soft.NTND. BS present. Skin: no rashes, purpura or petechiae.  Neuro: Normal gait. PERLA. EOMi. Alert. Oriented x3   No exam data present No results found. No results found for this or any previous visit (from the past 24 hour(s)).  Assessment/Plan: NEHEMIAH MONTEE is a 82 y.o. female present for OV for  Cough/dyspnea on exertion - Mild RLL crackle appreciated on exam. Her HPI suggest possible cardiac vs infectious cause. She does not appear fluid overloaded on exam, laying flat does increase cough. She is tachycardic (although   Improved to about 100 after sitting), otherwise VSS. - DG Chest 2 View; Future - CBC - B Nat Peptide - awaiting  CXR results, will treat with abx of felt to be bronchitis or PNA. If does not not appear infectious and will refer to cardio for further evaluation.   *Of note, pt is scheduled for a trip to [place] in a few weeks.       Reviewed expectations re: course of current medical issues.  Discussed self-management of symptoms.  Outlined signs and symptoms indicating need for more acute intervention.  Patient verbalized understanding and all questions were answered.  Patient received an After-Visit Summary.    No orders of the defined types were placed in this encounter.    Note is dictated utilizing voice recognition software. Although note has been proof read prior to signing, occasional typographical errors still can be missed. If any questions arise, please do not hesitate to call for verification.   electronically signed by:  Howard Pouch, DO  [place]

## 2017-08-01 NOTE — Telephone Encounter (Signed)
Patient notified and expressed understanding. Font desk will have to schedule appointment for patient on Friday.

## 2017-08-01 NOTE — Telephone Encounter (Signed)
Please inform patient the following information: Her cxr is not read yet, however it does look like she may have some fluid/infection in the RLL. I want to go ahead and start treatment with doxycyline every 12 hours for pneumonia/bronchitis. She is to follow up with her PCP Friday. Please schedule her. We will call her tomorrow once we get the full report and lab results.

## 2017-08-01 NOTE — Patient Instructions (Addendum)
Rest, hydrate.  + flonase, mucinex (DM if cough), nettie pot or nasal saline.  I will call you with lab results and xray as soon as I get them.  If cough present it can last up to 6-8 weeks.  F/U 2 weeks of not improved.

## 2017-08-02 ENCOUNTER — Ambulatory Visit: Payer: Medicare Other | Admitting: Family Medicine

## 2017-08-02 NOTE — Telephone Encounter (Signed)
Patient notified and expressed understanding. Patient notified of upcoming appointment with Dr. Anitra Lauth on Friday at 2:30pm.

## 2017-08-04 ENCOUNTER — Ambulatory Visit: Payer: Medicare Other | Admitting: Family Medicine

## 2017-08-04 ENCOUNTER — Ambulatory Visit (INDEPENDENT_AMBULATORY_CARE_PROVIDER_SITE_OTHER)
Admission: RE | Admit: 2017-08-04 | Discharge: 2017-08-04 | Disposition: A | Discharge status: Home / Self Care | Payer: Medicare Other | Source: Ambulatory Visit | Attending: Family Medicine | Admitting: Family Medicine

## 2017-08-04 ENCOUNTER — Encounter: Payer: Self-pay | Admitting: Family Medicine

## 2017-08-04 VITALS — BP 130/88 | HR 114 | Temp 98.4°F | Resp 16 | Ht 62.0 in | Wt 176.1 lb

## 2017-08-04 DIAGNOSIS — I509 Heart failure, unspecified: Secondary | ICD-10-CM

## 2017-08-04 DIAGNOSIS — I4891 Unspecified atrial fibrillation: Secondary | ICD-10-CM

## 2017-08-04 DIAGNOSIS — N182 Chronic kidney disease, stage 2 (mild): Secondary | ICD-10-CM | POA: Diagnosis not present

## 2017-08-04 LAB — BASIC METABOLIC PANEL
BUN: 34 mg/dL — AB (ref 6–23)
CO2: 23 mEq/L (ref 19–32)
Calcium: 10.5 mg/dL (ref 8.4–10.5)
Chloride: 104 mEq/L (ref 96–112)
Creatinine, Ser: 1.27 mg/dL — ABNORMAL HIGH (ref 0.40–1.20)
GFR: 42.98 mL/min — AB (ref >60.00)
GLUCOSE: 117 mg/dL — AB (ref 70–99)
POTASSIUM: 4 meq/L (ref 3.5–5.1)
SODIUM: 141 meq/L (ref 135–145)

## 2017-08-04 LAB — BRAIN NATRIURETIC PEPTIDE: PRO B NATRI PEPTIDE: 574 pg/mL — AB (ref 0.0–100.0)

## 2017-08-04 MED ORDER — METOPROLOL SUCCINATE ER 50 MG PO TB24
50.0000 mg | ORAL_TABLET | Freq: Every day | ORAL | 1 refills | Status: DC
Start: 2017-08-04 — End: 2017-08-10

## 2017-08-04 NOTE — Patient Instructions (Addendum)
Eat a low sodium diet.  Take your lasix (furosemide) 40 mg in the morning and 40 mg in the evening every day.  Increase your metoprolol to 54m twice a day.  Monitor your blood pressure and heart rate at home every day and write these numbers down to review with me at next f/u visit in 3-4d.  If your blood pressure drops to 110 or less on top, stop your amlodipine.

## 2017-08-04 NOTE — Progress Notes (Signed)
OFFICE VISIT  08/04/2017   CC:  Chief Complaint  Patient presents with  . Follow-up    CHF/Pneumonia     HPI:    Patient is a 82 y.o. Caucasian female who presents for 3 day f/u mild CHF. She was initially started on doxycycline for possible pneumonia, then CXR reading returned and pt was told to increase her lasix. Feeling MUCH better.  Still coughs some and has some mild SOB but MUCH less.  Today she walked up her stairs and did not feel out of breath.  No palpitations or heart racing.   No more orthopnea or PND.  No mucous production.  No fevers.  No dizziness, no chest pain, no jaw pain, no arm pain, no nausea or diaphoresis. Tolerating meds fine.  (08/01/17: PA/Lat CXR: COMPARISON:  02/19/2016  FINDINGS: Small bilateral pleural effusions. Bilateral mild interstitial thickening. No focal consolidation or pneumothorax. Stable cardiomegaly. No acute osseous abnormality.  IMPRESSION: Findings concerning for mild CHF.)   Past Medical History:  Diagnosis Date  . Abnormal EKG approx 2008   Nuclear stress test neg;   . Anxiety    with panic  . Arthritis   . Autoimmune hepatitis ([place])   . Cataract    s/p surgery--lens implants  . Chronic renal insufficiency, stage II (mild) 12/04/2012   GFR 60s  . Cirrhosis ([place])   . Diverticulosis 2009 colonoscopy  . Fracture of radial shaft, left, closed 11/16/10   fell down flight of stairs  . History of kidney stones   . Hx of adenomatous colonic polyps 2002   surveillance colonoscopy 2009, +polypectomy done-tubular adenoma w/out high grade.  05/2013 tubular adenomas--recall 3 yrs  . Hyperlipidemia   . Hypertension   . Low TSH level 02/18/2016   T3 norm, T4 mildly elevated--suspected sick euthyroid syndrome.  Repeat labs 06/2016: normal.  . NASH (nonalcoholic steatohepatitis)   . Osteopenia    DEXA 08/2010; repeat DEXA 02/2015 worse: fosamax started  . PAF (paroxysmal atrial fibrillation) ([place]) 02/2016   when in post-op for  ankle surgery; spontaneously converted in hosp, seen by Dr. Debara Pickett in consultation--metoprolol rate control + xarelto recommended.  Metop d/c due to hypot.  Plan to cont xarelto 20 mg qd indef due to CHAD-VASc score of 3.  . Peripheral edema   . Pneumonia 2015   hx  . Rheumatic fever     Past Surgical History:  Procedure Laterality Date  . APPENDECTOMY  1966   done during surgery for tubal pregnancy  . CATARACT EXTRACTION W/ INTRAOCULAR LENS IMPLANT  2013   bilat  . COLONOSCOPY W/ POLYPECTOMY  05/2013   +diverticulosis; recall 3 yrs (Dr. Henrene Pastor)  . DEXA  02/2015   T score -2.1 in both femoral necks; FRAX 10 yr risk of major osteoporotic fracture was 21%---fosamax started  . EYE SURGERY    . OPEN REDUCTION INTERNAL FIXATION (ORIF) TIBIA/FIBULA FRACTURE Left 02/18/2016   Procedure: OPEN REDUCTION INTERNAL FIXATION (ORIF) Right ankle trimalleolar fracture;  Surgeon: Wylene Simmer, MD;  Location: [place];  Service: Orthopedics;  Laterality: Left;  requests 11mns  . ORIF RADIAL FRACTURE  11/18/10   left; s/p slip on slippery floor and fell  . TONSILLECTOMY    . TRANSTHORACIC ECHOCARDIOGRAM  02/18/2016   LVEF of 55-60%, mild AI and mild MR and normal biatrial size.      Outpatient Medications Prior to Visit  Medication Sig Dispense Refill  . acetaminophen (TYLENOL) 325 MG tablet Take 650 mg by mouth every 6 (  six) hours as needed for moderate pain.     Marland Kitchen albuterol (PROAIR HFA) 108 (90 Base) MCG/ACT inhaler INHALE 2 PUFFS INTO THE LUNGS EVERY 4 HOURS IF NEEDED FOR WHEEZING OR SHORTNESS OF BREATH(COUGH SHORTNESS OF BREATH AND WHEEZING) 8.5 g 1  . alendronate (FOSAMAX) 70 MG tablet Take 1 tablet (70 mg total) by mouth every 7 (seven) days. Take with a full glass of water on an empty stomach. (Patient taking differently: Take 70 mg by mouth every Monday. Take with a full glass of water on an empty stomach.) 4 tablet 11  . ALPRAZolam (XANAX) 0.5 MG tablet TAKE 1 TABLET BY MOUTH THREE TIMES DAILY IF NEEDED  FOR STRESS 90 tablet 5  . amLODipine (NORVASC) 10 MG tablet TAKE 1 TABLET BY MOUTH EVERY DAY 90 tablet 1  . Apoaequorin (PREVAGEN) 10 MG CAPS Take 1 capsule by mouth daily.    . Ascorbic Acid (VITAMIN C) 500 MG tablet Take 1,000 mg by mouth every evening.     Marland Kitchen atorvastatin (LIPITOR) 40 MG tablet TAKE 1 TABLET BY MOUTH ONCE DAILY 90 tablet 1  . B Complex Vitamins (B COMPLEX-B12 PO) Take 1 capsule by mouth every evening.     . benzonatate (TESSALON) 200 MG capsule Take 1 capsule (200 mg total) by mouth 2 (two) times daily. 20 capsule 0  . calcium-vitamin D (OSCAL WITH D) 500-200 MG-UNIT per tablet Take 1 tablet by mouth every evening.     . carboxymethylcellulose (REFRESH PLUS) 0.5 % SOLN Place 2 drops into both eyes daily.    . Coenzyme Q10 200 MG capsule Take 200 mg by mouth every evening.     . docusate sodium (COLACE) 100 MG capsule Take 1 capsule (100 mg total) by mouth 2 (two) times daily. While taking narcotic pain medicine. 30 capsule 0  . doxycycline (VIBRA-TABS) 100 MG tablet Take 1 tablet (100 mg total) by mouth 2 (two) times daily. 20 tablet 0  . fluticasone (FLONASE) 50 MCG/ACT nasal spray Place 1 spray into both nostrils daily.    . furosemide (LASIX) 40 MG tablet TAKE 1 TABLET BY MOUTH TWICE DAILY 180 tablet 1  . losartan (COZAAR) 50 MG tablet take 1 tablet by mouth once daily 90 tablet 1  . Menthol, Topical Analgesic, (BLUE-EMU MAXIMUM STRENGTH EX) Apply 1 application topically 3 (three) times daily as needed (for knee pain/joint pain.).    Marland Kitchen Multiple Vitamins-Minerals (AIRBORNE PO) Take 1 tablet by mouth daily.    . Multiple Vitamins-Minerals (CENTRUM SILVER ULTRA WOMENS) TABS Take 1 tablet by mouth every evening.     . Omega-3 Fatty Acids (FISH OIL) 1200 MG CAPS Take 1,200 mg by mouth every evening.     . rivaroxaban (XARELTO) 20 MG TABS tablet Take 1 tablet (20 mg total) by mouth daily with supper. 30 tablet 11  . sodium chloride (OCEAN) 0.65 % nasal spray Place 1 spray into the  nose daily. After shower    . metoprolol succinate (TOPROL-XL) 25 MG 24 hr tablet TAKE 1 TABLET BY MOUTH TWICE DAILY 180 tablet 3   No facility-administered medications prior to visit.     Allergies  Allergen Reactions  . Augmentin [Amoxicillin-Pot Clavulanate] Nausea And Vomiting    "projectile vomiting" Has patient had a PCN reaction causing immediate rash, facial/tongue/throat swelling, SOB or lightheadedness with hypotension:No Has patient had a PCN reaction causing severe rash involving mucus membranes or skin necrosis:No Has patient had a PCN reaction that required hospitalization:No Has patient had a  PCN reaction occurring within the last 10 years:Yes If all of the above answers are "NO", then may proceed with Cephalosporin use.     ROS As per HPI  PE: Blood pressure 130/88, pulse (!) 114, temperature 98.4 F (36.9 C), temperature source Oral, resp. rate 16, height 5' 2"  (1.575 m), weight 176 lb 2 oz (79.9 kg), SpO2 96 %. Gen: Alert, well appearing.  Patient is oriented to person, place, time, and situation. AFFECT: pleasant, lucid thought and speech. VOH:YWVP: no injection, icteris, swelling, or exudate.  EOMI, PERRLA. Mouth: lips without lesion/swelling.  Oral mucosa pink and moist. Oropharynx without erythema, exudate, or swelling.  CV: irreg irreg, rate approx 120, no m/r Chest is clear, no wheezing or rales. Normal symmetric air entry throughout both lung fields. No chest wall deformities or tenderness.  No e to a changes. EXT: no significant edema.  No clubbing or cyanosis.  LABS:  Lab Results  Component Value Date   WBC 8.5 08/01/2017   HGB 13.9 08/01/2017   HCT 42.5 08/01/2017   MCV 90.1 08/01/2017   PLT 223.0 08/01/2017     Chemistry      Component Value Date/Time   NA 143 07/27/2017 0854   K 4.1 07/27/2017 0854   CL 106 07/27/2017 0854   CO2 28 07/27/2017 0854   BUN 19 07/27/2017 0854   CREATININE 1.17 07/27/2017 0854   CREATININE 0.92 12/07/2012  1122      Component Value Date/Time   CALCIUM 9.5 07/27/2017 0854   ALKPHOS 82 07/27/2017 0854   AST 17 07/27/2017 0854   ALT 18 07/27/2017 0854   BILITOT 0.8 07/27/2017 0854       IMPRESSION AND PLAN:  1) Acute CHF: possibly secondary to a-fib with RVR.  Also possibly diastolic HF secondary to hypertensive heart dz. She has improved with diuresis, but we need to slow her HR down. She is out of acute failure.  Will increase toprol xl to 40m bid.  Cut lasix back to 442mbid (prior to this illness, she had only been taking 4050mnce a day).  Continue xarelto 8m67m. Recheck BMET today (CRI II with recent diuresis). Recheck CXR today. Echo ordered--ASAP.  Patient instructions today: Eat a low sodium diet. Take your lasix (furosemide) 40 mg in the morning and 40 mg in the evening every day. Increase your metoprolol to 50mg47mce a day. Monitor your blood pressure and heart rate at home every day and write these numbers down to review with me at next f/u visit in 3-4d. If your blood pressure drops to 110 or less on top, stop your amlodipine. Unfortunately, [place], with >50% of this time spent in counseling and care coordination regarding the above problems.  An After Visit Summary was printed and given to the patient.  FOLLOW UP: Return for 3-4 d f/u chf/a-fib RVR.  Signed:  Phil MCrissie Sickles         08/04/2017

## 2017-08-08 ENCOUNTER — Ambulatory Visit: Payer: Medicare Other | Admitting: Family Medicine

## 2017-08-08 ENCOUNTER — Encounter: Payer: Self-pay | Admitting: Family Medicine

## 2017-08-08 VITALS — BP 112/74 | HR 115 | Temp 98.4°F | Resp 16 | Ht 62.0 in | Wt 174.1 lb

## 2017-08-08 DIAGNOSIS — E669 Obesity, unspecified: Secondary | ICD-10-CM | POA: Diagnosis not present

## 2017-08-08 DIAGNOSIS — I4891 Unspecified atrial fibrillation: Secondary | ICD-10-CM | POA: Diagnosis not present

## 2017-08-08 DIAGNOSIS — I509 Heart failure, unspecified: Secondary | ICD-10-CM

## 2017-08-08 NOTE — Patient Instructions (Addendum)
Stop amlodipine.  Increase metoprolol to 1 and 1/2 of your 95m tabs twice per day. Continue to monitor blood pressure and heart rate. Call office or return if bp 100/50 or less, or if you feel SOB, significant generalized weakness, or dizziness/lightheadedness.

## 2017-08-08 NOTE — Progress Notes (Addendum)
OFFICE VISIT  08/08/2017   CC:  Chief Complaint  Patient presents with  . Follow-up    CHF/A.Fib/RVR   HPI:    Patient is a 82 y.o. Caucasian female who presents for 4 day f/u acute CHF that I believe has been secondary to a paroxysm of a-fib with mildly rapid ventricular response. She clinically improved with a couple of days of increased diuresis, and her f/u CXR showed improvement.  Also on doxycycline that was originally rx'd for presumptive pneumonia on her initial visit for this episode. At last visit we kept her on her doxy, cut her lasix back to 47m bid, and increased her toprol xl to 531mbid.  She has remained on xarelto. Recheck of renal function was stable, electrolytes stable as well. Echocardiogram ordered 08/04/17  to be done ASAP--has not been done yet.  INTERIM HX:  She is feeling good: a little cough still.  No SOB walking on flat surfaces or going up stairs.  No wheezing, no cp or pressure, no palpitations or sensation of heart racing.  NO dizziness.  Energy level is good. Still taking doxy. She increased toprol xl to 5034mid as instructed last visit. Home bp's 108-120 over 73-89, HR 102-115.   Repeat CXR 08/04/17: EXAM: CHEST - 2 VIEW  COMPARISON:  August 01, 2017  FINDINGS: There is persistent cardiomegaly with pulmonary vascularity within normal limits. Small pleural effusions are stable. There is mild bibasilar atelectasis. There is no frank edema or consolidation. No adenopathy. There is aortic atherosclerosis. No evident bone lesions.  IMPRESSION: Stable cardiomegaly with small pleural effusions bilaterally. No frank edema or consolidation. There is bibasilar atelectasis. There is aortic atherosclerosis.  Aortic Atherosclerosis (ICD10-I70.0).  Past Medical History:  Diagnosis Date  . Abnormal EKG approx 2008   Nuclear stress test neg;   . Anxiety    with panic  . Arthritis   . [place] Cataract    s/p surgery--lens  implants  . Chronic renal insufficiency, stage II (mild) 12/04/2012   GFR 60s  . [place] Diverticulosis 2009 colonoscopy  . Fracture of radial shaft, left, closed 11/16/10   fell down flight of stairs  . History of kidney stones   . Hx of adenomatous colonic polyps 2002   surveillance colonoscopy 2009, +polypectomy done-tubular adenoma w/out high grade.  05/2013 tubular adenomas--recall 3 yrs  . Hyperlipidemia   . Hypertension   . Low TSH level 02/18/2016   T3 norm, T4 mildly elevated--suspected sick euthyroid syndrome.  Repeat labs 06/2016: normal.  . NASH (nonalcoholic steatohepatitis)   . Osteopenia    DEXA 08/2010; repeat DEXA 02/2015 worse: fosamax started  . PAF (paroxysmal atrial fibrillation) [place]   when in post-op for ankle surgery; spontaneously converted in hosp, seen by Dr. HilDebara Pickett consultation--metoprolol rate control + xarelto recommended.  Metop d/c due to hypot.  Plan to cont xarelto 20 mg qd indef due to CHAD-VASc score of 3.  . Peripheral edema   . Pneumonia 2015   hx  . Rheumatic fever     Past Surgical History:  Procedure Laterality Date  . APPENDECTOMY  1966   done during surgery for tubal pregnancy  . CATARACT EXTRACTION W/ INTRAOCULAR LENS IMPLANT  2013   bilat  . COLONOSCOPY W/ POLYPECTOMY  05/2013   +diverticulosis; recall 3 yrs (Dr. PerHenrene Pastor. DEXA  02/2015   T score -2.1 in both femoral necks; FRAX 10 yr risk of major  osteoporotic fracture was 21%---fosamax started  . EYE SURGERY    . OPEN REDUCTION INTERNAL FIXATION (ORIF) TIBIA/FIBULA FRACTURE Left 02/18/2016   Procedure: OPEN REDUCTION INTERNAL FIXATION (ORIF) Right ankle trimalleolar fracture;  Surgeon: Wylene Simmer, MD;  Location: [place];  Service: Orthopedics;  Laterality: Left;  requests 16mns  . ORIF RADIAL FRACTURE  11/18/10   left; s/p slip on slippery floor and fell  . TONSILLECTOMY    . TRANSTHORACIC ECHOCARDIOGRAM  02/18/2016   LVEF of 55-60%, mild AI and mild MR and normal  biatrial size.      Outpatient Medications Prior to Visit  Medication Sig Dispense Refill  . acetaminophen (TYLENOL) 325 MG tablet Take 650 mg by mouth every 6 (six) hours as needed for moderate pain.     .Marland Kitchenalbuterol (PROAIR HFA) 108 (90 Base) MCG/ACT inhaler INHALE 2 PUFFS INTO THE LUNGS EVERY 4 HOURS IF NEEDED FOR WHEEZING OR SHORTNESS OF BREATH(COUGH SHORTNESS OF BREATH AND WHEEZING) 8.5 g 1  . alendronate (FOSAMAX) 70 MG tablet Take 1 tablet (70 mg total) by mouth every 7 (seven) days. Take with a full glass of water on an empty stomach. (Patient taking differently: Take 70 mg by mouth every Monday. Take with a full glass of water on an empty stomach.) 4 tablet 11  . ALPRAZolam (XANAX) 0.5 MG tablet TAKE 1 TABLET BY MOUTH THREE TIMES DAILY IF NEEDED FOR STRESS 90 tablet 5  . amLODipine (NORVASC) 10 MG tablet TAKE 1 TABLET BY MOUTH EVERY DAY 90 tablet 1  . Apoaequorin (PREVAGEN) 10 MG CAPS Take 1 capsule by mouth daily.    . Ascorbic Acid (VITAMIN C) 500 MG tablet Take 1,000 mg by mouth every evening.     .Marland Kitchenatorvastatin (LIPITOR) 40 MG tablet TAKE 1 TABLET BY MOUTH ONCE DAILY 90 tablet 1  . B Complex Vitamins (B COMPLEX-B12 PO) Take 1 capsule by mouth every evening.     . benzonatate (TESSALON) 200 MG capsule Take 1 capsule (200 mg total) by mouth 2 (two) times daily. 20 capsule 0  . calcium-vitamin D (OSCAL WITH D) 500-200 MG-UNIT per tablet Take 1 tablet by mouth every evening.     . carboxymethylcellulose (REFRESH PLUS) 0.5 % SOLN Place 2 drops into both eyes daily.    . Coenzyme Q10 200 MG capsule Take 200 mg by mouth every evening.     . docusate sodium (COLACE) 100 MG capsule Take 1 capsule (100 mg total) by mouth 2 (two) times daily. While taking narcotic pain medicine. 30 capsule 0  . doxycycline (VIBRA-TABS) 100 MG tablet Take 1 tablet (100 mg total) by mouth 2 (two) times daily. 20 tablet 0  . fluticasone (FLONASE) 50 MCG/ACT nasal spray Place 1 spray into both nostrils daily.    .  furosemide (LASIX) 40 MG tablet TAKE 1 TABLET BY MOUTH TWICE DAILY 180 tablet 1  . losartan (COZAAR) 50 MG tablet take 1 tablet by mouth once daily 90 tablet 1  . Menthol, Topical Analgesic, (BLUE-EMU MAXIMUM STRENGTH EX) Apply 1 application topically 3 (three) times daily as needed (for knee pain/joint pain.).    .Marland Kitchenmetoprolol succinate (TOPROL-XL) 50 MG 24 hr tablet Take 1 tablet (50 mg total) by mouth daily. Take with or immediately following a meal. 60 tablet 1  . Multiple Vitamins-Minerals (AIRBORNE PO) Take 1 tablet by mouth daily.    . Multiple Vitamins-Minerals (CENTRUM SILVER ULTRA WOMENS) TABS Take 1 tablet by mouth every evening.     . Omega-3  Fatty Acids (FISH OIL) 1200 MG CAPS Take 1,200 mg by mouth every evening.     . rivaroxaban (XARELTO) 20 MG TABS tablet Take 1 tablet (20 mg total) by mouth daily with supper. 30 tablet 11  . sodium chloride (OCEAN) 0.65 % nasal spray Place 1 spray into the nose daily. After shower     No facility-administered medications prior to visit.     Allergies  Allergen Reactions  . Augmentin [Amoxicillin-Pot Clavulanate] Nausea And Vomiting    "projectile vomiting" Has patient had a PCN reaction causing immediate rash, facial/tongue/throat swelling, SOB or lightheadedness with hypotension:No Has patient had a PCN reaction causing severe rash involving mucus membranes or skin necrosis:No Has patient had a PCN reaction that required hospitalization:No Has patient had a PCN reaction occurring within the last 10 years:Yes If all of the above answers are "NO", then may proceed with Cephalosporin use.     ROS As per HPI  PE: Blood pressure 112/74, pulse (!) 115, temperature 98.4 F (36.9 C), temperature source Oral, resp. rate 16, height 5' 2"  (1.575 m), weight 174 lb 2 oz (79 kg), SpO2 95 %. Wt is down 2 lbs over the last 4 days. Gen: Alert, well appearing.  Patient is oriented to person, place, time, and situation. AFFECT: pleasant, lucid thought  and speech. CV: irreg irreg rhythm, rate 120 or so.  No murmur or rub. Chest is clear, no wheezing or rales. Normal symmetric air entry throughout both lung fields. No chest wall deformities or tenderness.  No e to a changes. EXT: no clubbing, cyanosis, or edema.    LABS:   Lab Results  Component Value Date   TSH 0.77 07/04/2016      Chemistry      Component Value Date/Time   NA 141 08/04/2017 1447   K 4.0 08/04/2017 1447   CL 104 08/04/2017 1447   CO2 23 08/04/2017 1447   BUN 34 (H) 08/04/2017 1447   CREATININE 1.27 (H) 08/04/2017 1447   CREATININE 0.92 12/07/2012 1122      Component Value Date/Time   CALCIUM 10.5 08/04/2017 1447   ALKPHOS 82 07/27/2017 0854   AST 17 07/27/2017 0854   ALT 18 07/27/2017 0854   BILITOT 0.8 07/27/2017 0854      IMPRESSION AND PLAN:  1) Acute CHF; suspect that this is due to an episode of her PAF with mildly rapid vent response. Diuresed well--clinically MUCH improved.  Repeat CXR recently still showed CM and small bilat effusions, though. Continued with baseline lasix dosing of 46m bid. HR still too high.  BP low normal to normal. Will increase toprol xl to 727mbid, d/c amlodipine at this time. Continue lasix 4078mid. Echo is scheduled for 2 days from now. Get in with cardiology ASAP--they may consider attempt at normalization of rhythm + assess current situation. Cardiology graciously got her a work in appt on 08/10/17.  An After Visit Summary was printed and given to the patient.  FOLLOW UP: Return in about 1 week (around 08/15/2017) for f/u afib/CHF/bp.  Signed:  PhiCrissie SicklesD           08/08/2017

## 2017-08-10 ENCOUNTER — Ambulatory Visit (HOSPITAL_COMMUNITY): Payer: Medicare Other | Attending: Cardiology

## 2017-08-10 ENCOUNTER — Encounter: Payer: Self-pay | Admitting: *Deleted

## 2017-08-10 ENCOUNTER — Encounter: Payer: Self-pay | Admitting: Physician Assistant

## 2017-08-10 ENCOUNTER — Ambulatory Visit: Payer: Medicare Other | Admitting: Physician Assistant

## 2017-08-10 ENCOUNTER — Other Ambulatory Visit: Payer: Self-pay

## 2017-08-10 VITALS — BP 132/98 | HR 128 | Ht 62.0 in | Wt 172.0 lb

## 2017-08-10 DIAGNOSIS — I083 Combined rheumatic disorders of mitral, aortic and tricuspid valves: Secondary | ICD-10-CM | POA: Diagnosis not present

## 2017-08-10 DIAGNOSIS — I4819 Other persistent atrial fibrillation: Secondary | ICD-10-CM

## 2017-08-10 DIAGNOSIS — Z7901 Long term (current) use of anticoagulants: Secondary | ICD-10-CM | POA: Diagnosis not present

## 2017-08-10 DIAGNOSIS — I5032 Chronic diastolic (congestive) heart failure: Secondary | ICD-10-CM | POA: Diagnosis not present

## 2017-08-10 DIAGNOSIS — I10 Essential (primary) hypertension: Secondary | ICD-10-CM

## 2017-08-10 DIAGNOSIS — I481 Persistent atrial fibrillation: Secondary | ICD-10-CM | POA: Diagnosis not present

## 2017-08-10 DIAGNOSIS — I509 Heart failure, unspecified: Secondary | ICD-10-CM | POA: Diagnosis present

## 2017-08-10 DIAGNOSIS — I4891 Unspecified atrial fibrillation: Secondary | ICD-10-CM | POA: Diagnosis not present

## 2017-08-10 MED ORDER — AMIODARONE HCL 200 MG PO TABS: 400.0000 mg | ORAL_TABLET | Freq: Two times a day (BID) | ORAL | 1 refills | Status: DC

## 2017-08-10 NOTE — Progress Notes (Signed)
Cardiology Office Note   Date:  08/10/2017   ID:  Brenda Matthews, DOB [birth year], MRN [id number]  PCP:  [name], MD  Cardiologist: Dr. Debara Pickett, 12/16/2016 Rosaria Ferries, PA-C   No chief complaint on file.   History of Present Illness: Brenda Matthews is a 82 y.o. female with a history of PAF on Xarelto, D-CHF, diverticulosis, NASH, HTN, HLD  7/19 office visit with PCP, patient Lasix had been increased to 40 mg daily>> 40 mg twice daily for volume overload 7/23 PCP office visit for CHF and A. fib, still with some volume overload so continued on Lasix at 40 mg twice daily, metoprolol increased to 75 mg twice daily, echo scheduled  Brenda Matthews presents for cardiology follow up.  Sx began fairly suddenly 7/15, she was SOB all the time. When she saw Dr Raoul Pitch, her HR was 114. She has been on Lasix 40 mg bid since the CXR was reviewed.   Her breathing is improved, she feels good when she walks more.   She has no palpitations, no awareness of the elevated HR. She has a BP cuff but has not checked it recently.   She has not missed any doses of Xarelto.   No chest pain. She exercises some, not very much. She has noticed no limitations on her activity recently.   She is moving to [place], is losing her mind over this.  She has been very busy doing things because of the mood, that is why she has not been exercising.    Past Medical History:  Diagnosis Date  . Abnormal EKG approx 2008   Nuclear stress test neg;   . Anxiety    with panic  . Arthritis   . Autoimmune hepatitis ([place])   . Cataract    s/p surgery--lens implants  . Chronic renal insufficiency, stage II (mild) 12/04/2012   GFR 60s  . Cirrhosis ([place])   . Diverticulosis 2009  . Fracture of radial shaft, left, closed 11/16/10   fell down flight of stairs  . History of kidney stones   . Hx of adenomatous colonic polyps 2002   surveillance colonoscopy 2009, +polypectomy done-tubular adenoma w/out high  grade.  05/2013 tubular adenomas--recall 3 yrs  . Hyperlipidemia   . Hypertension   . Low TSH level 02/18/2016   T3 norm, T4 mildly elevated--suspected sick euthyroid syndrome.  Repeat labs 06/2016: normal.  . NASH (nonalcoholic steatohepatitis)   . Osteopenia    DEXA 08/2010; repeat DEXA 02/2015 worse: fosamax started  . PAF (paroxysmal atrial fibrillation) ([place]) 02/2016   when in post-op for ankle surgery; spontaneously converted in hosp, seen by Dr. Debara Pickett in consultation--metoprolol rate control + xarelto recommended.  Metop d/c due to hypot.  Plan to cont xarelto 20 mg qd indef due to CHAD-VASc score of 3.  . Peripheral edema   . Pneumonia 2015   hx  . Rheumatic fever     Past Surgical History:  Procedure Laterality Date  . APPENDECTOMY  1966   done during surgery for tubal pregnancy  . CATARACT EXTRACTION W/ INTRAOCULAR LENS IMPLANT  2013   bilat  . COLONOSCOPY W/ POLYPECTOMY  05/2013   +diverticulosis; recall 3 yrs (Dr. Henrene Pastor)  . DEXA  02/2015   T score -2.1 in both femoral necks; FRAX 10 yr risk of major osteoporotic fracture was 21%---fosamax started  . EYE SURGERY    . OPEN REDUCTION INTERNAL FIXATION (ORIF) TIBIA/FIBULA FRACTURE Left 02/18/2016   Procedure:  OPEN REDUCTION INTERNAL FIXATION (ORIF) Right ankle trimalleolar fracture;  Surgeon: Wylene Simmer, MD;  Location: [facility];  Service: Orthopedics;  Laterality: Left;  requests 73mns  . ORIF RADIAL FRACTURE  11/18/10   left; s/p slip on slippery floor and fell  . TONSILLECTOMY    . TRANSTHORACIC ECHOCARDIOGRAM  02/18/2016   LVEF of 55-60%, mild AI and mild MR and normal biatrial size.      Current Outpatient Medications  Medication Sig Dispense Refill  . acetaminophen (TYLENOL) 325 MG tablet Take 650 mg by mouth every 6 (six) hours as needed for moderate pain.     .Marland Kitchenalbuterol (PROAIR HFA) 108 (90 Base) MCG/ACT inhaler INHALE 2 PUFFS INTO THE LUNGS EVERY 4 HOURS IF NEEDED FOR WHEEZING OR SHORTNESS OF BREATH(COUGH SHORTNESS OF  BREATH AND WHEEZING) 8.5 g 1  . alendronate (FOSAMAX) 70 MG tablet Take 1 tablet (70 mg total) by mouth every 7 (seven) days. Take with a full glass of water on an empty stomach. (Patient taking differently: Take 70 mg by mouth every Monday. Take with a full glass of water on an empty stomach.) 4 tablet 11  . ALPRAZolam (XANAX) 0.5 MG tablet TAKE 1 TABLET BY MOUTH THREE TIMES DAILY IF NEEDED FOR STRESS 90 tablet 5  . amLODipine (NORVASC) 10 MG tablet TAKE 1 TABLET BY MOUTH EVERY DAY 90 tablet 1  . Apoaequorin (PREVAGEN) 10 MG CAPS Take 1 capsule by mouth daily.    . Ascorbic Acid (VITAMIN C) 500 MG tablet Take 1,000 mg by mouth every evening.     .Marland Kitchenatorvastatin (LIPITOR) 40 MG tablet TAKE 1 TABLET BY MOUTH ONCE DAILY 90 tablet 1  . B Complex Vitamins (B COMPLEX-B12 PO) Take 1 capsule by mouth every evening.     . benzonatate (TESSALON) 200 MG capsule Take 1 capsule (200 mg total) by mouth 2 (two) times daily. 20 capsule 0  . calcium-vitamin D (OSCAL WITH D) 500-200 MG-UNIT per tablet Take 1 tablet by mouth every evening.     . carboxymethylcellulose (REFRESH PLUS) 0.5 % SOLN Place 2 drops into both eyes daily.    . Coenzyme Q10 200 MG capsule Take 200 mg by mouth every evening.     . docusate sodium (COLACE) 100 MG capsule Take 1 capsule (100 mg total) by mouth 2 (two) times daily. While taking narcotic pain medicine. 30 capsule 0  . doxycycline (VIBRA-TABS) 100 MG tablet Take 1 tablet (100 mg total) by mouth 2 (two) times daily. 20 tablet 0  . fluticasone (FLONASE) 50 MCG/ACT nasal spray Place 1 spray into both nostrils daily.    . furosemide (LASIX) 40 MG tablet TAKE 1 TABLET BY MOUTH TWICE DAILY 180 tablet 1  . losartan (COZAAR) 50 MG tablet take 1 tablet by mouth once daily 90 tablet 1  . Menthol, Topical Analgesic, (BLUE-EMU MAXIMUM STRENGTH EX) Apply 1 application topically 3 (three) times daily as needed (for knee pain/joint pain.).    .Marland Kitchenmetoprolol succinate (TOPROL-XL) 50 MG 24 hr tablet  Take 75 mg by mouth daily. Take with or immediately following a meal.    . Multiple Vitamins-Minerals (AIRBORNE PO) Take 1 tablet by mouth daily.    . Multiple Vitamins-Minerals (CENTRUM SILVER ULTRA WOMENS) TABS Take 1 tablet by mouth every evening.     . Omega-3 Fatty Acids (FISH OIL) 1200 MG CAPS Take 1,200 mg by mouth every evening.     . rivaroxaban (XARELTO) 20 MG TABS tablet Take 1 tablet (20 mg total)  by mouth daily with supper. 30 tablet 11  . sodium chloride (OCEAN) 0.65 % nasal spray Place 1 spray into the nose daily. After shower     No current facility-administered medications for this visit.     Allergies:   Augmentin [amoxicillin-pot clavulanate]    Social History:  The patient  reports that she has never smoked. She has never used smokeless tobacco. She reports that she drinks alcohol. She reports that she does not use drugs.   Family History:  The patient's family history includes Diabetes in her sister; Heart disease in her father and mother; Hypertension in her brother.    ROS:  Please see the history of present illness. All other systems are reviewed and negative.    PHYSICAL EXAM: VS:  BP (!) 132/98   Pulse (!) 128   Ht 5' 2"  (1.575 m)   Wt 172 lb (78 kg)   LMP  (LMP Unknown)   SpO2 97%   BMI 31.46 kg/m  , BMI Body mass index is 31.46 kg/m. GEN: Well nourished, well developed, female in no acute distress  HEENT: normal for age  Neck: Minimal JVD, no carotid bruit, no masses Cardiac: Rapid and irregular rate and rhythm; no murmur, no rubs, or gallops Respiratory: Decreased breath sounds bases bilaterally, normal work of breathing GI: soft, nontender, nondistended, + BS MS: no deformity or atrophy; trace lower extremity edema; distal pulses are 2+ in all 4 extremities   Skin: warm and dry, no rash Neuro:  Strength and sensation are intact Psych: euthymic mood, full affect   EKG:  EKG is ordered today. The ekg ordered today demonstrates atrial  fibrillation with rapid ventricular response, heart rate 128  ECHO: 02/19/2016 - Left ventricle: The cavity size was normal. Systolic function was   normal. The estimated ejection fraction was in the range of 55%   to 60%. Wall motion was normal; there were no regional wall   motion abnormalities. Left ventricular diastolic function   parameters were normal. - Aortic valve: Trileaflet; mildly thickened, mildly calcified   leaflets. There was mild regurgitation. - Mitral valve: There was mild regurgitation. - Pulmonic valve: There was trivial regurgitation. - Pulmonary arteries: PA peak pressure: 31 mm Hg (S).   Recent Labs: 07/27/2017: ALT 18 08/01/2017: Hemoglobin 13.9; Platelets 223.0 08/04/2017: BUN 34; Creatinine, Ser 1.27; Potassium 4.0; Pro B Natriuretic peptide (BNP) 574.0; Sodium 141    Lipid Panel    Component Value Date/Time   CHOL 129 07/27/2017 0854   TRIG 160.0 (H) 07/27/2017 0854   HDL 47.30 07/27/2017 0854   CHOLHDL 3 07/27/2017 0854   VLDL 32.0 07/27/2017 0854   LDLCALC 50 07/27/2017 0854     Wt Readings from Last 3 Encounters:  08/10/17 172 lb (78 kg)  08/08/17 174 lb 2 oz (79 kg)  08/04/17 176 lb 2 oz (79.9 kg)     Other studies Reviewed: Additional studies/ records that were reviewed today include: Office notes from Dr Anitra Lauth and Dr. Debara Pickett plus testing.  ASSESSMENT AND PLAN:  1.  Persistent atrial fibrillation with rapid ventricular response: She has almost certainly been in this rhythm since her symptoms began approximately 7/15.  She has been compliant with the metoprolol but her heart rate is still elevated despite up titration of her beta-blocker. -I reviewed the patient with Dr. Harrell Gave who agrees with the plan. -I feel she will need cardioversion, but I feel an antiarrhythmic should be started prior to that.  Given her age and  other medical issues, amiodarone is the best option.  We will therefore start amiodarone 200 mg, 2 tablets twice a  day. -She recently had a complete metabolic profile drawn by her PCP and liver functions are fine.  The TSH has not been checked, we will draw one. -I will see her again next Tuesday, she has not spontaneously converted to sinus rhythm, a cardioversion is already scheduled for Thursday.  2.  Chronic diastolic CHF: She had volume overload a week ago but has diuresed well on Lasix 40 mg twice daily.  Her volume status is good. -Her creatinine was up a little when Dr. Anitra Lauth checked it 7/19. -Recheck next week prior to the cardioversion. -She may have to tolerate a slightly higher creatinine in order to get her volume status back to baseline.  3.  Chronic anticoagulation: - She is compliant with Xarelto and has not missed any doses. - Follow renal function carefully, if her creatinine increases much more, she will need to be on a lower dose of Xarelto.  4.  Hypertension: Her diastolic blood pressures a little elevated today, but her systolic blood pressure is at target.  No med changes.   Current medicines are reviewed at length with the patient today.  The patient does not have concerns regarding medicines.  The following changes have been made: Add amiodarone  Labs/ tests ordered today include:   Orders Placed This Encounter  Procedures  . TSH  . EKG 12-Lead     Disposition:   FU with Dr. Debara Pickett  Signed, Rosaria Ferries, PA-C  08/10/2017 5:18 PM    [facility] Phone: [phone]; Fax: [phone]  This note was written with the assistance of speech recognition software. Please excuse any transcriptional errors.

## 2017-08-10 NOTE — Patient Instructions (Addendum)
Medication Instructions:  Your physician has recommended you make the following change in your medication: 1.  START Amiodarone 200 mg taking 2 tablets by mouth twice a day   Labwork: TODAY:  TSH  Testing/Procedures: Your physician has recommended that you have a Cardioversion (DCCV). Electrical Cardioversion uses a jolt of electricity to your heart either through paddles or wired patches attached to your chest. This is a controlled, usually prescheduled, procedure. Defibrillation is done under light anesthesia in the hospital, and you usually go home the day of the procedure. This is done to get your heart back into a normal rhythm. You are not awake for the procedure. Please see the instruction sheet given to you today.   Follow-Up: Your physician wants you to follow-up in:  2 WEEKS FROM 08/17/17 FOR POST CARDIOVERSION WITH Brenda BARRETT, PA-C OR DR. HILTY.   Any Other Special Instructions Will Be Listed Below (If Applicable).   Electrical Cardioversion Electrical cardioversion is the delivery of a jolt of electricity to restore a normal rhythm to the heart. A rhythm that is too fast or is not regular keeps the heart from pumping well. In this procedure, sticky patches or metal paddles are placed on the chest to deliver electricity to the heart from a device. This procedure may be done in an emergency if:  There is low or no blood pressure as a result of the heart rhythm.  Normal rhythm must be restored as fast as possible to protect the brain and heart from further damage.  It may save a life.  This procedure may also be done for irregular or fast heart rhythms that are not immediately life-threatening. Tell a health care provider about:  Any allergies you have.  All medicines you are taking, including vitamins, herbs, eye drops, creams, and over-the-counter medicines.  Any problems you or family members have had with anesthetic medicines.  Any blood disorders you have.  Any  surgeries you have had.  Any medical conditions you have.  Whether you are pregnant or may be pregnant. What are the risks? Generally, this is a safe procedure. However, problems may occur, including:  Allergic reactions to medicines.  A blood clot that breaks free and travels to other parts of your body.  The possible return of an abnormal heart rhythm within hours or days after the procedure.  Your heart stopping (cardiac arrest). This is rare.  What happens before the procedure? Medicines  Your health care provider may have you start taking: ? Blood-thinning medicines (anticoagulants) so your blood does not clot as easily. ? Medicines may be given to help stabilize your heart rate and rhythm.  Ask your health care provider about changing or stopping your regular medicines. This is especially important if you are taking diabetes medicines or blood thinners. General instructions  Plan to have someone take you home from the hospital or clinic.  If you will be going home right after the procedure, plan to have someone with you for 24 hours.  Follow instructions from your health care provider about eating or drinking restrictions. What happens during the procedure?  To lower your risk of infection: ? Your health care team will wash or sanitize their hands. ? Your skin will be washed with soap.  An IV tube will be inserted into one of your veins.  You will be given a medicine to help you relax (sedative).  Sticky patches (electrodes) or metal paddles may be placed on your chest.  An electrical shock will  be delivered. The procedure may vary among health care providers and hospitals. What happens after the procedure?  Your blood pressure, heart rate, breathing rate, and blood oxygen level will be monitored until the medicines you were given have worn off.  Do not drive for 24 hours if you were given a sedative.  Your heart rhythm will be watched to make sure it does  not change. This information is not intended to replace advice given to you by your health care provider. Make sure you discuss any questions you have with your health care provider. Document Released: 12/24/2001 Document Revised: 09/02/2015 Document Reviewed: 07/10/2015 Elsevier Interactive Patient Education  2017 [employer].    If you need a refill on your cardiac medications before your next appointment, please call your pharmacy.

## 2017-08-11 ENCOUNTER — Telehealth: Payer: Self-pay | Admitting: Internal Medicine

## 2017-08-11 LAB — TSH: TSH: 0.65 u[IU]/mL (ref 0.450–4.500)

## 2017-08-11 NOTE — Telephone Encounter (Signed)
New Message       Pt c/o medication issue:  1. Name of Medication: Metoprolol  2. How are you currently taking this medication (dosage and times per day)? 75 mg 2 x a day  3. Are you having a reaction (difficulty breathing--STAT)? No  4. What is your medication issue? Patient need clarification on the above medication. Not sure if she is taking it right.

## 2017-08-11 NOTE — Telephone Encounter (Signed)
We will therefore start amiodarone 200 mg, 2 tablets twice a day.  D/w Patient  directly, no further questions

## 2017-08-14 ENCOUNTER — Encounter: Payer: Self-pay | Admitting: Family Medicine

## 2017-08-15 ENCOUNTER — Encounter: Payer: Self-pay | Admitting: Physician Assistant

## 2017-08-15 ENCOUNTER — Encounter: Payer: Self-pay | Admitting: Family Medicine

## 2017-08-15 ENCOUNTER — Ambulatory Visit: Payer: Medicare Other | Admitting: Family Medicine

## 2017-08-15 ENCOUNTER — Ambulatory Visit: Payer: Medicare Other | Admitting: Physician Assistant

## 2017-08-15 VITALS — BP 116/76 | HR 100 | Temp 97.5°F | Resp 16 | Ht 62.0 in | Wt 172.4 lb

## 2017-08-15 VITALS — BP 128/80 | HR 97 | Ht 62.0 in | Wt 171.6 lb

## 2017-08-15 DIAGNOSIS — N182 Chronic kidney disease, stage 2 (mild): Secondary | ICD-10-CM

## 2017-08-15 DIAGNOSIS — I10 Essential (primary) hypertension: Secondary | ICD-10-CM

## 2017-08-15 DIAGNOSIS — Z7901 Long term (current) use of anticoagulants: Secondary | ICD-10-CM

## 2017-08-15 DIAGNOSIS — I5032 Chronic diastolic (congestive) heart failure: Secondary | ICD-10-CM | POA: Diagnosis not present

## 2017-08-15 DIAGNOSIS — I481 Persistent atrial fibrillation: Secondary | ICD-10-CM | POA: Diagnosis not present

## 2017-08-15 DIAGNOSIS — I4891 Unspecified atrial fibrillation: Secondary | ICD-10-CM

## 2017-08-15 DIAGNOSIS — I4819 Other persistent atrial fibrillation: Secondary | ICD-10-CM

## 2017-08-15 LAB — BASIC METABOLIC PANEL
BUN: 37 mg/dL — AB (ref 6–23)
CO2: 27 mEq/L (ref 19–32)
Calcium: 9.3 mg/dL (ref 8.4–10.5)
Chloride: 104 mEq/L (ref 96–112)
Creatinine, Ser: 1.51 mg/dL — ABNORMAL HIGH (ref 0.40–1.20)
GFR: 35.19 mL/min — ABNORMAL LOW (ref >60.00)
GLUCOSE: 100 mg/dL — AB (ref 70–99)
POTASSIUM: 3.9 meq/L (ref 3.5–5.1)
Sodium: 140 mEq/L (ref 135–145)

## 2017-08-15 NOTE — Patient Instructions (Signed)
Medication Instructions: Your physician recommends that you continue on your current medications as directed.    If you need a refill on your cardiac medications before your next appointment, please call your pharmacy.   Labwork: None  Procedures/Testing: Keep schedule appointment for Cardioversion  Follow-Up: Your physician wants you to Keep scheduled appointment with Dr. Debara Pickett. Special Instructions:    Thank you for choosing [facility] at [facility]!!

## 2017-08-15 NOTE — Progress Notes (Signed)
OFFICE VISIT  08/15/2017   CC:  Chief Complaint  Patient presents with  . Follow-up    A. Fib/ CHF/ BP   HPI:    Patient is a 82 y.o. Caucasian female who presents for 7 day f/u a-fib with mild RVR, recent episode pulm edema secondary to this.  I have her on lasix 97m bid and recently I increased her toprol xl to 737mbid and d/c'd her amlodipine.  She saw cardiology 5 d/a and was started on amiodarone, otherwise continued on all other meds at current doses.  Plan for f/u with cardiology today and if not back in NSR she will get DC cardioversion with cardiology in 2d. They also changed her toprol dosing to 75 mg qhs.  Denies palpitations or feeling of racing heart.   Home bp's avg 120/70s last 4-5 d, HR avg 90s. Denies SOB or DOE currently.  No CP or dizziness.  Reviewed recent echocardiogram results with her today, overall very good.  Past Medical History:  Diagnosis Date  . Abnormal EKG approx 2008   Nuclear stress test neg;   . Anxiety    with panic  . Arthritis   . Autoimmune hepatitis ([facility]  . Cataract    s/p surgery--lens implants  . Chronic renal insufficiency, stage II (mild) 12/04/2012   GFR 60s  . Cirrhosis ([facility]  . Diverticulosis 2009  . Fracture of radial shaft, left, closed 11/16/10   fell down flight of stairs  . History of kidney stones   . Hx of adenomatous colonic polyps 2002   surveillance colonoscopy 2009, +polypectomy done-tubular adenoma w/out high grade.  05/2013 tubular adenomas--recall 3 yrs  . Hyperlipidemia   . Hypertension   . Low TSH level 02/18/2016   T3 norm, T4 mildly elevated--suspected sick euthyroid syndrome.  Repeat labs 06/2016: normal.  . NASH (nonalcoholic steatohepatitis)   . Osteopenia    DEXA 08/2010; repeat DEXA 02/2015 worse: fosamax started  . PAF (paroxysmal atrial fibrillation) ([facility]   when in post-op for ankle surgery; spontaneously converted in hosp, seen by Dr. HiDebara Pickettn consultation--metoprolol rate control +  xarelto recommended.  Metop d/c due to hypot.  Plan to cont xarelto 20 mg qd indef due to CHAD-VASc score of 3.  . Peripheral edema   . Pneumonia 2015   hx  . Rheumatic fever     Past Surgical History:  Procedure Laterality Date  . APPENDECTOMY  1966   done during surgery for tubal pregnancy  . CATARACT EXTRACTION W/ INTRAOCULAR LENS IMPLANT  2013   bilat  . COLONOSCOPY W/ POLYPECTOMY  05/2013   +diverticulosis; recall 3 yrs (Dr. PeHenrene Pastor . DEXA  02/2015   T score -2.1 in both femoral necks; FRAX 10 yr risk of major osteoporotic fracture was 21%---fosamax started  . EYE SURGERY    . OPEN REDUCTION INTERNAL FIXATION (ORIF) TIBIA/FIBULA FRACTURE Left 02/18/2016   Procedure: OPEN REDUCTION INTERNAL FIXATION (ORIF) Right ankle trimalleolar fracture;  Surgeon: JoWylene SimmerMD;  [facility];  Laterality: Left;  requests [id number]  . ORIF RADIAL FRACTURE  11/18/10   left; s/p slip on slippery floor and fell  . TONSILLECTOMY    . TRANSTHORACIC ECHOCARDIOGRAM  02/18/2016; 08/10/17   LVEF of 55-60%, mild AI and mild MR and normal biatrial size.  07/2017--normal LV function, mild enlarge aortic root, mild/mod TR, bilat atrial enlargement.    Outpatient Medications Prior to Visit  Medication Sig Dispense Refill  . acetaminophen (  TYLENOL) 325 MG tablet Take 650 mg by mouth every 6 (six) hours as needed for moderate pain or headache.     . albuterol (PROAIR HFA) 108 (90 Base) MCG/ACT inhaler INHALE 2 PUFFS INTO THE LUNGS EVERY 4 HOURS IF NEEDED FOR WHEEZING OR SHORTNESS OF BREATH(COUGH SHORTNESS OF BREATH AND WHEEZING) (Patient taking differently: Inhale 2 puffs into the lungs every 4 (four) hours as needed for wheezing or shortness of breath. ) 8.5 g 1  . alendronate (FOSAMAX) 70 MG tablet Take 1 tablet (70 mg total) by mouth every 7 (seven) days. Take with a full glass of water on an empty stomach. (Patient taking differently: Take 70 mg by mouth every Monday. Take with a full  glass of water on an empty stomach.) 4 tablet 11  . ALPRAZolam (XANAX) 0.5 MG tablet TAKE 1 TABLET BY MOUTH THREE TIMES DAILY IF NEEDED FOR STRESS (Patient taking differently: Take 0.5 mg by mouth 3 (three) times daily as needed for anxiety. ) 90 tablet 5  . amiodarone (PACERONE) 200 MG tablet Take 2 tablets (400 mg total) by mouth 2 (two) times daily. 120 tablet 1  . Apoaequorin (PREVAGEN) 10 MG CAPS Take 10 mg by mouth daily.     Marland Kitchen atorvastatin (LIPITOR) 40 MG tablet TAKE 1 TABLET BY MOUTH ONCE DAILY 90 tablet 1  . calcium-vitamin D (OSCAL WITH D) 500-200 MG-UNIT per tablet Take 1 tablet by mouth every evening.     . carboxymethylcellulose (REFRESH PLUS) 0.5 % SOLN Place 2 drops into both eyes daily.    . Coenzyme Q10 200 MG capsule Take 200 mg by mouth every evening.     . fluticasone (FLONASE) 50 MCG/ACT nasal spray Place 2 sprays into both nostrils daily.     . furosemide (LASIX) 40 MG tablet TAKE 1 TABLET BY MOUTH TWICE DAILY 180 tablet 1  . losartan (COZAAR) 50 MG tablet take 1 tablet by mouth once daily 90 tablet 1  . metoprolol succinate (TOPROL-XL) 50 MG 24 hr tablet Take 75 mg by mouth daily. Take with or immediately following a meal.    . Misc Natural Products (OSTEO BI-FLEX TRIPLE STRENGTH PO) Take 1 tablet by mouth daily.    . Misc Natural Products (TART CHERRY ADVANCED PO) Take 1 capsule by mouth daily.    . Multiple Vitamins-Minerals (CENTRUM SILVER ULTRA WOMENS) TABS Take 1 tablet by mouth every evening.     . Omega-3 Fatty Acids (FISH OIL) 1200 MG CAPS Take 1,200 mg by mouth every evening.     . rivaroxaban (XARELTO) 20 MG TABS tablet Take 1 tablet (20 mg total) by mouth daily with supper. (Patient taking differently: Take 20 mg by mouth at bedtime. ) 30 tablet 11  . sodium chloride (OCEAN) 0.65 % nasal spray Place 1 spray into the nose daily. After shower    . trolamine salicylate (ASPERCREME) 10 % cream Apply 1 application topically daily.    . TURMERIC PO Take 1 capsule by  mouth daily.    . Multiple Vitamins-Minerals (AIRBORNE PO) Take 1 tablet by mouth daily as needed (for vacation).     Marland Kitchen amLODipine (NORVASC) 10 MG tablet TAKE 1 TABLET BY MOUTH EVERY DAY (Patient not taking: Reported on 08/11/2017) 90 tablet 1  . benzonatate (TESSALON) 200 MG capsule Take 1 capsule (200 mg total) by mouth 2 (two) times daily. (Patient not taking: Reported on 08/11/2017) 20 capsule 0  . docusate sodium (COLACE) 100 MG capsule Take 1 capsule (100 mg total)  by mouth 2 (two) times daily. While taking narcotic pain medicine. (Patient not taking: Reported on 08/11/2017) 30 capsule 0   No facility-administered medications prior to visit.     Allergies  Allergen Reactions  . Augmentin [Amoxicillin-Pot Clavulanate] Nausea And Vomiting and Other (See Comments)    "projectile vomiting" Has patient had a PCN reaction causing immediate rash, facial/tongue/throat swelling, SOB or lightheadedness with hypotension:No Has patient had a PCN reaction causing severe rash involving mucus membranes or skin necrosis:No Has patient had a PCN reaction that required hospitalization:No Has patient had a PCN reaction occurring within the last 10 years:Yes If all of the above answers are "NO", then may proceed with Cephalosporin use.     ROS As per HPI  PE: Blood pressure 116/76, pulse 100, temperature (!) 97.5 F (36.4 C), temperature source Oral, resp. rate 16, height 5' 2"  (1.575 m), weight 172 lb 6 oz (78.2 kg), SpO2 95 %. Gen: Alert, well appearing.  Patient is oriented to person, place, time, and situation. AFFECT: pleasant, lucid thought and speech. CV: irreg irreg, rate 100 or so.  No murmur or rub. Chest is clear, no wheezing or rales. Normal symmetric air entry throughout both lung fields. No chest wall deformities or tenderness. EXT: trace L LE pitting edema, no edema in R LL.  She has compression stockings on.  LABS:    Chemistry      Component Value Date/Time   NA 141 08/04/2017  1447   K 4.0 08/04/2017 1447   CL 104 08/04/2017 1447   CO2 23 08/04/2017 1447   BUN 34 (H) 08/04/2017 1447   CREATININE 1.27 (H) 08/04/2017 1447   CREATININE 0.92 12/07/2012 1122      Component Value Date/Time   CALCIUM 10.5 08/04/2017 1447   ALKPHOS 82 07/27/2017 0854   AST 17 07/27/2017 0854   ALT 18 07/27/2017 0854   BILITOT 0.8 07/27/2017 0854     Lab Results  Component Value Date   TSH 0.650 08/10/2017   IMPRESSION AND PLAN:  1) A-fib with RVR, stable.  Asymptomatic. Recent pulm edema/CHF resolved. Plan is to continue current meds at current doses and she'll revisit her cardiology provider today, with plan of DCCV in 2d. BMET today, will forward results to her cardiology providers.  2) HTN: The current medical regimen is effective;  continue present plan and medications. BMET today.  3) CRI with GFR in the 60s. Following cr/lytes today.  An After Visit Summary was printed and given to the patient.  FOLLOW UP: Return for f/u to be determined based on upcoming cardiac f/u and procedure.  Signed:  Crissie Sickles, MD           08/15/2017

## 2017-08-15 NOTE — Progress Notes (Signed)
Cardiology Office Note   Date:  08/15/2017   ID:  Brenda Matthews, DOB Oct 12, 1935, MRN [id number]  PCP:  Brenda Sou, MD  Cardiologist: Dr. Debara Matthews, 12/16/2016 Brenda Ferries, PA-C 08/10/2017  Chief Complaint  Patient presents with  . Follow-up    previous EKG 08/10/17-- repeat     History of Present Illness: Brenda Matthews is a 82 y.o. female with a history of PAF on Xarelto, D-CHF, diverticulosis, NASH, HTN, HLD  7/25 office visit for volume overload, atrial fibrillation with elevated heart rate, started on amiodarone and early follow-up to schedule cardioversion.  CHF exacerbation improved with Lasix per PCP  Brenda Matthews presents for cardiology follow up.  She has no awareness of the arrhythmia. No presyncope or syncope.  She is weighing daily, her weight is stable or trending down.   She is still having DOE, but is doing better.   Is watching the sodium in the foods she eats, but was unaware of the amount of sodium in canned beans.   Is moving to [employer], is concerned that she will get extra salt in their food.   Is compliant w/ her meds, has not missed any doses of amio or Xarelto.  Wears compression stockings daily.   She is looking forward to the cardioversion, hopes that she will feel better after this.   Past Medical History:  Diagnosis Date  . Abnormal EKG approx 2008   Nuclear stress test neg;   . Anxiety    with panic  . Arthritis   . Autoimmune hepatitis ([place])   . Cataract    s/p surgery--lens implants  . Chronic renal insufficiency, stage II (mild) 12/04/2012   GFR 60s  . Cirrhosis ([place])   . Diverticulosis 2009  . Fracture of radial shaft, left, closed 11/16/10   fell down flight of stairs  . History of kidney stones   . Hx of adenomatous colonic polyps 2002   surveillance colonoscopy 2009, +polypectomy done-tubular adenoma w/out high grade.  05/2013 tubular adenomas--recall 3 yrs  . Hyperlipidemia   . Hypertension   . Low TSH level  02/18/2016   T3 norm, T4 mildly elevated--suspected sick euthyroid syndrome.  Repeat labs 06/2016: normal.  . NASH (nonalcoholic steatohepatitis)   . Osteopenia    DEXA 08/2010; repeat DEXA 02/2015 worse: fosamax started  . PAF (paroxysmal atrial fibrillation) ([place]) 02/2016   when in post-op for ankle surgery; spontaneously converted in hosp, seen by Dr. Debara Matthews in consultation--metoprolol rate control + xarelto recommended.  Metop d/c due to hypot.  Plan to cont xarelto 20 mg qd indef due to CHAD-VASc score of 3.  . Peripheral edema   . Pneumonia 2015   hx  . Rheumatic fever     Past Surgical History:  Procedure Laterality Date  . APPENDECTOMY  1966   done during surgery for tubal pregnancy  . CATARACT EXTRACTION W/ INTRAOCULAR LENS IMPLANT  2013   bilat  . COLONOSCOPY W/ POLYPECTOMY  05/2013   +diverticulosis; recall 3 yrs (Dr. Henrene Pastor)  . DEXA  02/2015   T score -2.1 in both femoral necks; FRAX 10 yr risk of major osteoporotic fracture was 21%---fosamax started  . EYE SURGERY    . OPEN REDUCTION INTERNAL FIXATION (ORIF) TIBIA/FIBULA FRACTURE Left 02/18/2016   Procedure: OPEN REDUCTION INTERNAL FIXATION (ORIF) Right ankle trimalleolar fracture;  Surgeon: Wylene Simmer, MD;  Location: [facility];  Service: Orthopedics;  Laterality: Left;  requests 46mns  . ORIF RADIAL FRACTURE  11/18/10   left; s/p slip on slippery floor and fell  . TONSILLECTOMY    . TRANSTHORACIC ECHOCARDIOGRAM  02/18/2016; 08/10/17   LVEF of 55-60%, mild AI and mild MR and normal biatrial size.  07/2017--normal LV function, mild enlarge aortic root, mild/mod TR, bilat atrial enlargement.    Current Outpatient Medications  Medication Sig Dispense Refill  . acetaminophen (TYLENOL) 325 MG tablet Take 650 mg by mouth every 6 (six) hours as needed for moderate pain or headache.     . albuterol (PROAIR HFA) 108 (90 Base) MCG/ACT inhaler INHALE 2 PUFFS INTO THE LUNGS EVERY 4 HOURS IF NEEDED FOR WHEEZING OR SHORTNESS OF BREATH(COUGH  SHORTNESS OF BREATH AND WHEEZING) (Patient taking differently: Inhale 2 puffs into the lungs every 4 (four) hours as needed for wheezing or shortness of breath. ) 8.5 g 1  . alendronate (FOSAMAX) 70 MG tablet Take 1 tablet (70 mg total) by mouth every 7 (seven) days. Take with a full glass of water on an empty stomach. (Patient taking differently: Take 70 mg by mouth every Monday. Take with a full glass of water on an empty stomach.) 4 tablet 11  . ALPRAZolam (XANAX) 0.5 MG tablet TAKE 1 TABLET BY MOUTH THREE TIMES DAILY IF NEEDED FOR STRESS (Patient taking differently: Take 0.5 mg by mouth 3 (three) times daily as needed for anxiety. ) 90 tablet 5  . amiodarone (PACERONE) 200 MG tablet Take 2 tablets (400 mg total) by mouth 2 (two) times daily. 120 tablet 1  . Apoaequorin (PREVAGEN) 10 MG CAPS Take 10 mg by mouth daily.     Marland Kitchen atorvastatin (LIPITOR) 40 MG tablet TAKE 1 TABLET BY MOUTH ONCE DAILY 90 tablet 1  . calcium-vitamin D (OSCAL WITH D) 500-200 MG-UNIT per tablet Take 1 tablet by mouth every evening.     . carboxymethylcellulose (REFRESH PLUS) 0.5 % SOLN Place 2 drops into both eyes daily.    . Coenzyme Q10 200 MG capsule Take 200 mg by mouth every evening.     . fluticasone (FLONASE) 50 MCG/ACT nasal spray Place 2 sprays into both nostrils daily.     . furosemide (LASIX) 40 MG tablet TAKE 1 TABLET BY MOUTH TWICE DAILY 180 tablet 1  . losartan (COZAAR) 50 MG tablet take 1 tablet by mouth once daily 90 tablet 1  . metoprolol succinate (TOPROL-XL) 50 MG 24 hr tablet Take 75 mg by mouth daily. Patient has 25 mg tablets- takes three daily    . Misc Natural Products (OSTEO BI-FLEX TRIPLE STRENGTH PO) Take 1 tablet by mouth daily.    . Misc Natural Products (TART CHERRY ADVANCED PO) Take 1 capsule by mouth daily.    . Multiple Vitamins-Minerals (AIRBORNE PO) Take 1 tablet by mouth daily as needed (for vacation).     . Multiple Vitamins-Minerals (CENTRUM SILVER ULTRA WOMENS) TABS Take 1 tablet by  mouth every evening.     . Omega-3 Fatty Acids (FISH OIL) 1200 MG CAPS Take 1,200 mg by mouth every evening.     . rivaroxaban (XARELTO) 20 MG TABS tablet Take 1 tablet (20 mg total) by mouth daily with supper. (Patient taking differently: Take 20 mg by mouth at bedtime. ) 30 tablet 11  . sodium chloride (OCEAN) 0.65 % nasal spray Place 1 spray into the nose daily. After shower    . trolamine salicylate (ASPERCREME) 10 % cream Apply 1 application topically daily.    . TURMERIC PO Take 1 capsule by mouth daily.  No current facility-administered medications for this visit.     Allergies:   Augmentin [amoxicillin-pot clavulanate]    Social History:  The patient  reports that she has never smoked. She has never used smokeless tobacco. She reports that she drinks alcohol. She reports that she does not use drugs.   Family History:  The patient's family history includes Diabetes in her sister; Heart disease in her father and mother; Hypertension in her brother.  Family status: The patient indicated that her mother is deceased. She indicated that her father is deceased. She indicated that her sister is deceased. She indicated that the status of her brother is unknown. She indicated that the status of her neg hx is unknown.    ROS:  Please see the history of present illness. All other systems are reviewed and negative.    PHYSICAL EXAM: VS:  BP 128/80 (BP Location: Left Arm, Patient Position: Sitting)   Pulse 97   Ht 5' 2"  (1.575 m)   Wt 171 lb 9.6 oz (77.8 kg)   LMP  (LMP Unknown)   BMI 31.39 kg/m  , BMI Body mass index is 31.39 kg/m. GEN: Well nourished, well developed, female in no acute distress  HEENT: normal for age  Neck: no JVD, no carotid bruit, no masses Cardiac: Irregular R&R; no murmur, no rubs, or gallops Respiratory: rare rales bases bilaterally, normal work of breathing GI: soft, nontender, nondistended, + BS MS: no deformity or atrophy; trace edema; distal pulses are  2+ in all 4 extremities   Skin: warm and dry, no rash Neuro:  Strength and sensation are intact Psych: euthymic mood, full affect   EKG:  EKG is ordered today. The ekg ordered today demonstrates Atrial fib, HR 97, no acute ischemic changes, no significant morphology changes from 08/10/2017  Recent Labs: 07/27/2017: ALT 18 08/01/2017: Hemoglobin 13.9; Platelets 223.0 08/04/2017: Pro B Natriuretic peptide (BNP) 574.0 08/10/2017: TSH 0.650 08/15/2017: BUN 37; Creatinine, Ser 1.51; Potassium 3.9; Sodium 140    Lipid Panel    Component Value Date/Time   CHOL 129 07/27/2017 0854   TRIG 160.0 (H) 07/27/2017 0854   HDL 47.30 07/27/2017 0854   CHOLHDL 3 07/27/2017 0854   VLDL 32.0 07/27/2017 0854   LDLCALC 50 07/27/2017 0854     Wt Readings from Last 3 Encounters:  08/15/17 171 lb 9.6 oz (77.8 kg)  08/15/17 172 lb 6 oz (78.2 kg)  08/10/17 172 lb (78 kg)     Other studies Reviewed: Additional studies/ records that were reviewed today include: office notes and testing.  ASSESSMENT AND PLAN:  1.  Persistent atrial fib: -- no spontaneous conversion to SR  -She has been loading with amiodarone and is not having any side effects from this.  Her liver functions were previously checked and were fine, TSH was within normal limits.  She gets yearly eye exams. - Her heart rate has been hard to control and we cannot uptitrate her rate control medications because her blood pressure is not high enough. - Her heart rate is a little bit better controlled today, the amiodarone may be helping with this. - The plan was previously reviewed with Dr. Harrell Gave, who agreed with amiodarone and then cardioversion.  The patient is agreeable to cardioversion which has been scheduled. --The risks and benefits of a cardioversion including stroke heart attack death and kidney damage were discussed with the patient who indicates understanding and agrees to proceed.  She will keep her previously scheduled  appointment at the  hospital to have this done. - She has been given instructions for this and has no questions. - Continue the amiodarone at 2 tablets twice daily until the cardioversion and then decreased to 1 tablet daily  2.  Chronic anticoagulation: No bleeding issues and no doses of Xarelto missed.  3.  Chronic diastolic CHF: Her weight is trending down a little.  Her volume status is improved.  No med changes for now.  Continue current therapy and reinforced the need to continue sodium restrictions.  Reassess after cardioversion to see if she has an easier time maintaining her weight  4.  Hypertension: Blood pressures well controlled on current therapy.   Current medicines are reviewed at length with the patient today.  The patient does not have concerns regarding medicines.  The following changes have been made:  no change  Labs/ tests ordered today include:   Orders Placed This Encounter  Procedures  . EKG 12-Lead     Disposition:   FU with Dr. Debara Matthews  Signed, Brenda Ferries, PA-C  08/15/2017 4:27 PM    [facility] Phone: [phone]; Fax: [phone]  This note was written with the assistance of speech recognition software. Please excuse any transcriptional errors.

## 2017-08-16 ENCOUNTER — Other Ambulatory Visit: Payer: Self-pay | Admitting: Family Medicine

## 2017-08-17 ENCOUNTER — Ambulatory Visit (HOSPITAL_COMMUNITY)
Admission: RE | Admit: 2017-08-17 | Discharge: 2017-08-17 | Disposition: A | Discharge status: Home / Self Care | Payer: Medicare Other | Source: Ambulatory Visit | Attending: Cardiology | Admitting: Cardiology

## 2017-08-17 ENCOUNTER — Telehealth: Payer: Self-pay | Admitting: Physician Assistant

## 2017-08-17 ENCOUNTER — Ambulatory Visit (HOSPITAL_COMMUNITY): Payer: Medicare Other | Admitting: Certified Registered Nurse Anesthetist

## 2017-08-17 ENCOUNTER — Other Ambulatory Visit: Payer: Self-pay

## 2017-08-17 ENCOUNTER — Encounter (HOSPITAL_COMMUNITY)
Admission: RE | Disposition: A | Discharge status: Home / Self Care | Payer: Self-pay | Source: Ambulatory Visit | Attending: Cardiology

## 2017-08-17 DIAGNOSIS — I480 Paroxysmal atrial fibrillation: Secondary | ICD-10-CM

## 2017-08-17 DIAGNOSIS — I481 Persistent atrial fibrillation: Secondary | ICD-10-CM | POA: Diagnosis not present

## 2017-08-17 DIAGNOSIS — J189 Pneumonia, unspecified organism: Secondary | ICD-10-CM

## 2017-08-17 HISTORY — PX: THORACENTESIS: SHX235

## 2017-08-17 HISTORY — DX: Pneumonia, unspecified organism: J18.9

## 2017-08-17 SURGERY — CANCELLED PROCEDURE

## 2017-08-17 NOTE — Progress Notes (Signed)
Patient procedure cancelled due to patient self converting.  12-Lead EKG obtained prior to discharge.  Patient encouraged to follow up with cardiologist with any concerns.

## 2017-08-17 NOTE — Telephone Encounter (Signed)
New Message:    Pt was supposed to have her heart shocked this morning, they did not do it. Pt heart is now back in rhythm. Her question is should she continue to take her Amiodarone?

## 2017-08-17 NOTE — Telephone Encounter (Signed)
Spoke with pt who states she was scheduled to have a cardioversion today but was cancelled due self converted. Pt calling to inquire if she need to continue on amiodarone. Routing to PA for recommendation.

## 2017-08-18 ENCOUNTER — Telehealth: Payer: Self-pay | Admitting: Physician Assistant

## 2017-08-18 MED ORDER — AMIODARONE HCL 200 MG PO TABS: 200.0000 mg | ORAL_TABLET | Freq: Every day | ORAL | 1 refills | Status: DC

## 2017-08-18 NOTE — Telephone Encounter (Signed)
Pt notified of Raquel's message she will continue her medications as directed.

## 2017-08-18 NOTE — Telephone Encounter (Signed)
Patient should continue all medication as prescribed until follow up with Cardiology as previously schedule.

## 2017-08-18 NOTE — Telephone Encounter (Signed)
New message   Pt c/o medication issue:  1. Name of Medication: amiodarone (PACERONE) 200 MG tablet and furosemide (LASIX) 40 MG tablet  2. How are you currently taking this medication (dosage and times per day)?   3. Are you having a reaction (difficulty breathing--STAT)? No   4. What is your medication issue? Patient wants to know if she should still be taking these medications the way they were prescribed before her heart went back into rhythm.

## 2017-08-18 NOTE — Telephone Encounter (Signed)
Patient notified directly. Medication changed.

## 2017-08-18 NOTE — Telephone Encounter (Signed)
Please ask her to decrease the amiodarone to 1 tablet daily, 200 mg. Keep follow-up appointment with Dr. Debara Pickett. Thanks

## 2017-08-20 ENCOUNTER — Encounter: Payer: Self-pay | Admitting: Family Medicine

## 2017-08-20 NOTE — H&P (Signed)
   Patient was in sinus rhythm. Procedure cancelled. Patient sent home.   Candee Furbish, MD

## 2017-08-28 ENCOUNTER — Other Ambulatory Visit: Payer: Self-pay

## 2017-08-28 ENCOUNTER — Ambulatory Visit (INDEPENDENT_AMBULATORY_CARE_PROVIDER_SITE_OTHER): Payer: Medicare Other

## 2017-08-28 VITALS — BP 112/60 | HR 81 | Ht 62.0 in | Wt 169.2 lb

## 2017-08-28 DIAGNOSIS — Z0000 Encounter for general adult medical examination without abnormal findings: Secondary | ICD-10-CM

## 2017-08-28 DIAGNOSIS — E2839 Other primary ovarian failure: Secondary | ICD-10-CM | POA: Diagnosis not present

## 2017-08-28 NOTE — Patient Instructions (Addendum)
Schedule bone scan.   Continue doing brain stimulating activities (puzzles, reading, adult coloring books, staying active) to keep memory sharp.   Bring a copy of your living will and/or healthcare power of attorney to your next office visit.  Health Maintenance, Female Adopting a healthy lifestyle and getting preventive care can go a long way to promote health and wellness. Talk with your health care provider about what schedule of regular examinations is right for you. This is a good chance for you to check in with your provider about disease prevention and staying healthy. In between checkups, there are plenty of things you can do on your own. Experts have done a lot of research about which lifestyle changes and preventive measures are most likely to keep you healthy. Ask your health care provider for more information. Weight and diet Eat a healthy diet  Be sure to include plenty of vegetables, fruits, low-fat dairy products, and lean protein.  Do not eat a lot of foods high in solid fats, added sugars, or salt.  Get regular exercise. This is one of the most important things you can do for your health. ? Most adults should exercise for at least 150 minutes each week. The exercise should increase your heart rate and make you sweat (moderate-intensity exercise). ? Most adults should also do strengthening exercises at least twice a week. This is in addition to the moderate-intensity exercise.  Maintain a healthy weight  Body mass index (BMI) is a measurement that can be used to identify possible weight problems. It estimates body fat based on height and weight. Your health care provider can help determine your BMI and help you achieve or maintain a healthy weight.  For females 68 years of age and older: ? A BMI below 18.5 is considered underweight. ? A BMI of 18.5 to 24.9 is normal. ? A BMI of 25 to 29.9 is considered overweight. ? A BMI of 30 and above is considered obese.  Watch  levels of cholesterol and blood lipids  You should start having your blood tested for lipids and cholesterol at 80 years of age, then have this test every 5 years.  You may need to have your cholesterol levels checked more often if: ? Your lipid or cholesterol levels are high. ? You are older than 80 years of age. ? You are at high risk for heart disease.  Cancer screening Lung Cancer  Lung cancer screening is recommended for adults 68-66 years old who are at high risk for lung cancer because of a history of smoking.  A yearly low-dose CT scan of the lungs is recommended for people who: ? Currently smoke. ? Have quit within the past 15 years. ? Have at least a 30-pack-year history of smoking. A pack year is smoking an average of one pack of cigarettes a day for 1 year.  Yearly screening should continue until it has been 15 years since you quit.  Yearly screening should stop if you develop a health problem that would prevent you from having lung cancer treatment.  Breast Cancer  Practice breast self-awareness. This means understanding how your breasts normally appear and feel.  It also means doing regular breast self-exams. Let your health care provider know about any changes, no matter how small.  If you are in your 20s or 30s, you should have a clinical breast exam (CBE) by a health care provider every 1-3 years as part of a regular health exam.  If you are 40 or  older, have a CBE every year. Also consider having a breast X-ray (mammogram) every year.  If you have a family history of breast cancer, talk to your health care provider about genetic screening.  If you are at high risk for breast cancer, talk to your health care provider about having an MRI and a mammogram every year.  Breast cancer gene (BRCA) assessment is recommended for women who have family members with BRCA-related cancers. BRCA-related cancers include: ? Breast. ? Ovarian. ? Tubal. ? Peritoneal  cancers.  Results of the assessment will determine the need for genetic counseling and BRCA1 and BRCA2 testing.  Cervical Cancer Your health care provider may recommend that you be screened regularly for cancer of the pelvic organs (ovaries, uterus, and vagina). This screening involves a pelvic examination, including checking for microscopic changes to the surface of your cervix (Pap test). You may be encouraged to have this screening done every 3 years, beginning at age 83.  For women ages 57-65, health care providers may recommend pelvic exams and Pap testing every 3 years, or they may recommend the Pap and pelvic exam, combined with testing for human papilloma virus (HPV), every 5 years. Some types of HPV increase your risk of cervical cancer. Testing for HPV may also be done on women of any age with unclear Pap test results.  Other health care providers may not recommend any screening for nonpregnant women who are considered low risk for pelvic cancer and who do not have symptoms. Ask your health care provider if a screening pelvic exam is right for you.  If you have had past treatment for cervical cancer or a condition that could lead to cancer, you need Pap tests and screening for cancer for at least 20 years after your treatment. If Pap tests have been discontinued, your risk factors (such as having a new sexual partner) need to be reassessed to determine if screening should resume. Some women have medical problems that increase the chance of getting cervical cancer. In these cases, your health care provider may recommend more frequent screening and Pap tests.  Colorectal Cancer  This type of cancer can be detected and often prevented.  Routine colorectal cancer screening usually begins at 80 years of age and continues through 80 years of age.  Your health care provider may recommend screening at an earlier age if you have risk factors for colon cancer.  Your health care provider may also  recommend using home test kits to check for hidden blood in the stool.  A small camera at the end of a tube can be used to examine your colon directly (sigmoidoscopy or colonoscopy). This is done to check for the earliest forms of colorectal cancer.  Routine screening usually begins at age 43.  Direct examination of the colon should be repeated every 5-10 years through 80 years of age. However, you may need to be screened more often if early forms of precancerous polyps or small growths are found.  Skin Cancer  Check your skin from head to toe regularly.  Tell your health care provider about any new moles or changes in moles, especially if there is a change in a mole's shape or color.  Also tell your health care provider if you have a mole that is larger than the size of a pencil eraser.  Always use sunscreen. Apply sunscreen liberally and repeatedly throughout the day.  Protect yourself by wearing long sleeves, pants, a wide-brimmed hat, and sunglasses whenever you are outside.  Heart disease, diabetes, and high blood pressure  High blood pressure causes heart disease and increases the risk of stroke. High blood pressure is more likely to develop in: ? People who have blood pressure in the high end of the normal range (130-139/85-89 mm Hg). ? People who are overweight or obese. ? People who are African American.  If you are 70-78 years of age, have your blood pressure checked every 3-5 years. If you are 24 years of age or older, have your blood pressure checked every year. You should have your blood pressure measured twice-once when you are at a hospital or clinic, and once when you are not at a hospital or clinic. Record the average of the two measurements. To check your blood pressure when you are not at a hospital or clinic, you can use: ? An automated blood pressure machine at a pharmacy. ? A home blood pressure monitor.  If you are between 77 years and 5 years old, ask your  health care provider if you should take aspirin to prevent strokes.  Have regular diabetes screenings. This involves taking a blood sample to check your fasting blood sugar level. ? If you are at a normal weight and have a low risk for diabetes, have this test once every three years after 80 years of age. ? If you are overweight and have a high risk for diabetes, consider being tested at a younger age or more often. Preventing infection Hepatitis B  If you have a higher risk for hepatitis B, you should be screened for this virus. You are considered at high risk for hepatitis B if: ? You were born in a country where hepatitis B is common. Ask your health care provider which countries are considered high risk. ? Your parents were born in a high-risk country, and you have not been immunized against hepatitis B (hepatitis B vaccine). ? You have HIV or AIDS. ? You use needles to inject street drugs. ? You live with someone who has hepatitis B. ? You have had sex with someone who has hepatitis B. ? You get hemodialysis treatment. ? You take certain medicines for conditions, including cancer, organ transplantation, and autoimmune conditions.  Hepatitis C  Blood testing is recommended for: ? Everyone born from [birth year] through 1963. ? Anyone with known risk factors for hepatitis C.  Sexually transmitted infections (STIs)  You should be screened for sexually transmitted infections (STIs) including gonorrhea and chlamydia if: ? You are sexually active and are younger than 80 years of age. ? You are older than 80 years of age and your health care provider tells you that you are at risk for this type of infection. ? Your sexual activity has changed since you were last screened and you are at an increased risk for chlamydia or gonorrhea. Ask your health care provider if you are at risk.  If you do not have HIV, but are at risk, it may be recommended that you take a prescription medicine daily to  prevent HIV infection. This is called pre-exposure prophylaxis (PrEP). You are considered at risk if: ? You are sexually active and do not regularly use condoms or know the HIV status of your partner(s). ? You take drugs by injection. ? You are sexually active with a partner who has HIV.  Talk with your health care provider about whether you are at high risk of being infected with HIV. If you choose to begin PrEP, you should first be tested for HIV.  You should then be tested every 3 months for as long as you are taking PrEP. Pregnancy  If you are premenopausal and you may become pregnant, ask your health care provider about preconception counseling.  If you may become pregnant, take 400 to 800 micrograms (mcg) of folic acid every day.  If you want to prevent pregnancy, talk to your health care provider about birth control (contraception). Osteoporosis and menopause  Osteoporosis is a disease in which the bones lose minerals and strength with aging. This can result in serious bone fractures. Your risk for osteoporosis can be identified using a bone density scan.  If you are 12 years of age or older, or if you are at risk for osteoporosis and fractures, ask your health care provider if you should be screened.  Ask your health care provider whether you should take a calcium or vitamin D supplement to lower your risk for osteoporosis.  Menopause may have certain physical symptoms and risks.  Hormone replacement therapy may reduce some of these symptoms and risks. Talk to your health care provider about whether hormone replacement therapy is right for you. Follow these instructions at home:  Schedule regular health, dental, and eye exams.  Stay current with your immunizations.  Do not use any tobacco products including cigarettes, chewing tobacco, or electronic cigarettes.  If you are pregnant, do not drink alcohol.  If you are breastfeeding, limit how much and how often you drink  alcohol.  Limit alcohol intake to no more than 1 drink per day for nonpregnant women. One drink equals 12 ounces of beer, 5 ounces of wine, or 1 ounces of hard liquor.  Do not use street drugs.  Do not share needles.  Ask your health care provider for help if you need support or information about quitting drugs.  Tell your health care provider if you often feel depressed.  Tell your health care provider if you have ever been abused or do not feel safe at home. This information is not intended to replace advice given to you by your health care provider. Make sure you discuss any questions you have with your health care provider. Document Released: 07/19/2010 Document Revised: 06/11/2015 Document Reviewed: 10/07/2014 [employer] Interactive Patient Education  [employer].

## 2017-08-28 NOTE — Progress Notes (Addendum)
Subjective:   Brenda Matthews is a 82 y.o. female who presents for Medicare Annual (Subsequent) preventive examination.  Review of Systems:  No ROS.  Medicare Wellness Visit. Additional risk factors are reflected in the social history.  Cardiac Risk Factors include: advanced age (>45mn, >>51women);dyslipidemia;hypertension;obesity (BMI >30kg/m2);sedentary lifestyle;family history of premature cardiovascular disease   Sleep patterns: Sleeps well, interrupted.  Home Safety/Smoke Alarms: Feels safe in home. Smoke alarms in place.  Living environment; residence and Firearm Safety: [place] 2 [place] Son lives close.  Seat Belt Safety/Bike Helmet: Wears seat belt.   Female:   Pap-N/A       Mammo-06/21/2017, negative.       Dexa scan-02/23/2015, Osteopenia.Ordered today.       CCS-Colonoscopy 05/30/2013, polyp. Recall 3 years. Plans to discuss with Dr. PHenrene Pastor      Objective:     Vitals: BP 112/60 (BP Location: Left Arm, Patient Position: Sitting, Cuff Size: Normal)   Pulse 81   Ht 5' 2"  (1.575 m)   Wt 169 lb 4 oz (76.8 kg)   LMP  (LMP Unknown)   SpO2 95%   BMI 30.96 kg/m   Body mass index is 30.96 kg/m.  Advanced Directives 08/28/2017 08/01/2016 02/18/2016 02/17/2016 02/07/2016 05/25/2015 05/16/2013  Does Patient Have a Medical Advance Directive? Yes Yes - - Yes No Patient has advance directive, copy not in chart  Type of Advance Directive Living will;Healthcare Power of [place] - Healthcare Power of [place] - [place]  Does patient want to make changes to medical advance directive? - - - - - - No change requested  Copy of [place] Chart? No - copy requested No - copy requested No - copy requested No - copy requested No - copy requested - Copy requested from family  Pre-existing out of facility DNR order (yellow  form or pink MOST form) - - - - - - No    Tobacco Social History   Tobacco Use  Smoking Status Never Smoker  Smokeless Tobacco Never Used     Counseling given: Not Answered   Past Medical History:  Diagnosis Date  . Abnormal EKG approx 2008   Nuclear stress test neg;   . Anxiety    with panic  . Arthritis   . [place]immune hepatitis ([place]   . Cataract    s/p surgery--lens implants  . Chronic renal insufficiency, stage II (mild) 12/04/2012   GFR 60s  . Cirrhosis ([place]   . Diverticulosis 2009  . Fracture of radial shaft, left, closed 11/16/10   fell down flight of stairs  . History of kidney stones   . Hx of adenomatous colonic polyps 2002   surveillance colonoscopy 2009, +polypectomy done-tubular adenoma w/out high grade.  05/2013 tubular adenomas--recall 3 yrs  . Hyperlipidemia   . Hypertension   . Low TSH level 02/18/2016   T3 norm, T4 mildly elevated--suspected sick euthyroid syndrome.  Repeat labs 06/2016: normal.  . NASH (nonalcoholic steatohepatitis)   . Osteopenia    DEXA 08/2010; repeat DEXA 02/2015 worse: fosamax started  . PAF (paroxysmal atrial fibrillation) ([place] 02/2016   when in post-op for ankle surgery; spontaneously converted in hosp, seen by Dr. HDebara Pickettin consultation--metoprolol rate control + xarelto recommended.  Metop d/c due to hypot.  Plan to cont xarelto 20 mg qd indef due to CHAD-VASc score of 3.  A-fib  w/RVR and CHF 07/2017; pt placed on amiodarone and plan for CV, but pt was in sinus rhythm when she went in for her DC CV, so she was sent home.  . Peripheral edema   . Pneumonia 2015   hx  . Rheumatic fever    Past Surgical History:  Procedure Laterality Date  . APPENDECTOMY  1966   done during surgery for tubal pregnancy  . CATARACT EXTRACTION W/ INTRAOCULAR LENS IMPLANT  2013   bilat  . COLONOSCOPY W/ POLYPECTOMY  05/2013   +diverticulosis; recall 3 yrs (Dr. Henrene Pastor)  . DEXA  02/2015   T score -2.1 in both femoral necks; FRAX 10 yr risk of major  osteoporotic fracture was 21%---fosamax started  . EYE SURGERY    . OPEN REDUCTION INTERNAL FIXATION (ORIF) TIBIA/FIBULA FRACTURE Left 02/18/2016   Procedure: OPEN REDUCTION INTERNAL FIXATION (ORIF) Right ankle trimalleolar fracture;  Surgeon: Wylene Simmer, MD;  Location: [facility];  Service: Orthopedics;  Laterality: Left;  requests 75mns  . ORIF RADIAL FRACTURE  11/18/10   left; s/p slip on slippery floor and fell  . TONSILLECTOMY    . TRANSTHORACIC ECHOCARDIOGRAM  02/18/2016; 08/10/17   LVEF of 55-60%, mild AI and mild MR and normal biatrial size.  07/2017--normal LV function, mild enlarge aortic root, mild/mod TR, bilat atrial enlargement.   Family History  Problem Relation Age of Onset  . Heart disease Mother   . Heart disease Father   . Hypertension Brother   . Diabetes Sister   . Colon cancer Neg Hx   . Pancreatic cancer Neg Hx   . Rectal cancer Neg Hx   . Stomach cancer Neg Hx    Social History   Socioeconomic History  . Marital status: Widowed    Spouse name: Not on file  . Number of children: Not on file  . Years of education: Not on file  . Highest education level: Not on file  Occupational History  . Not on file  Social Needs  . Financial resource strain: Not on file  . Food insecurity:    Worry: Not on file    Inability: Not on file  . Transportation needs:    Medical: Not on file    Non-medical: Not on file  Tobacco Use  . Smoking status: Never Smoker  . Smokeless tobacco: Never Used  Substance and Sexual Activity  . Alcohol use: Yes    Comment: rarely  . Drug use: No  . Sexual activity: Not on file  Lifestyle  . Physical activity:    Days per week: Not on file    Minutes per session: Not on file  . Stress: Not on file  Relationships  . Social connections:    Talks on phone: Not on file    Gets together: Not on file    Attends religious service: Not on file    Active member of club or organization: Not on file    Attends meetings of clubs or  organizations: Not on file    Relationship status: Not on file  Other Topics Concern  . Not on file  Social History Narrative   Widow, 2 sons.   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  Rare alcohol.   No drugs.  Exercise: 4 times per week, about 422m    Outpatient Encounter Medications as of 08/28/2017  Medication Sig  . acetaminophen (TYLENOL) 325 MG tablet Take 650 mg by mouth every 6 (six) hours as needed for moderate pain  or headache.   . albuterol (PROAIR HFA) 108 (90 Base) MCG/ACT inhaler INHALE 2 PUFFS INTO THE LUNGS EVERY 4 HOURS IF NEEDED FOR WHEEZING OR SHORTNESS OF BREATH(COUGH SHORTNESS OF BREATH AND WHEEZING) (Patient taking differently: Inhale 2 puffs into the lungs every 4 (four) hours as needed for wheezing or shortness of breath. )  . alendronate (FOSAMAX) 70 MG tablet Take 1 tablet (70 mg total) by mouth every 7 (seven) days. Take with a full glass of water on an empty stomach. (Patient taking differently: Take 70 mg by mouth every Monday. Take with a full glass of water on an empty stomach.)  . ALPRAZolam (XANAX) 0.5 MG tablet TAKE 1 TABLET BY MOUTH THREE TIMES DAILY IF NEEDED FOR STRESS (Patient taking differently: Take 0.5 mg by mouth 3 (three) times daily as needed for anxiety. )  . amiodarone (PACERONE) 200 MG tablet Take 1 tablet (200 mg total) by mouth daily.  Marland Kitchen Apoaequorin (PREVAGEN) 10 MG CAPS Take 10 mg by mouth daily.   Marland Kitchen atorvastatin (LIPITOR) 40 MG tablet TAKE 1 TABLET BY MOUTH ONCE DAILY  . calcium-vitamin D (OSCAL WITH D) 500-200 MG-UNIT per tablet Take 1 tablet by mouth every evening.   . carboxymethylcellulose (REFRESH PLUS) 0.5 % SOLN Place 2 drops into both eyes daily.  . Coenzyme Q10 200 MG capsule Take 200 mg by mouth every evening.   . fluticasone (FLONASE) 50 MCG/ACT nasal spray Place 2 sprays into both nostrils daily.   . furosemide (LASIX) 40 MG tablet TAKE 1 TABLET BY MOUTH TWICE DAILY  . losartan (COZAAR) 50 MG tablet take 1 tablet by mouth  once daily  . metoprolol succinate (TOPROL-XL) 50 MG 24 hr tablet Take 75 mg by mouth daily. Patient has 25 mg tablets- takes three daily  . Misc Natural Products (OSTEO BI-FLEX TRIPLE STRENGTH PO) Take 1 tablet by mouth daily.  . Misc Natural Products (TART CHERRY ADVANCED PO) Take 1 capsule by mouth daily.  . Multiple Vitamins-Minerals (CENTRUM SILVER ULTRA WOMENS) TABS Take 1 tablet by mouth every evening.   . Omega-3 Fatty Acids (FISH OIL) 1200 MG CAPS Take 1,200 mg by mouth every evening.   . rivaroxaban (XARELTO) 20 MG TABS tablet Take 1 tablet (20 mg total) by mouth daily with supper. (Patient taking differently: Take 20 mg by mouth at bedtime. )  . sodium chloride (OCEAN) 0.65 % nasal spray Place 1 spray into the nose daily. After shower  . trolamine salicylate (ASPERCREME) 10 % cream Apply 1 application topically daily.  . TURMERIC PO Take 1 capsule by mouth daily.  . Multiple Vitamins-Minerals (AIRBORNE PO) Take 1 tablet by mouth daily as needed (for vacation).    No facility-administered encounter medications on file as of 08/28/2017.     Activities of Daily Living In your present state of health, do you have any difficulty performing the following activities: 08/28/2017  Hearing? N  Vision? N  Difficulty concentrating or making decisions? N  Walking or climbing stairs? N  Dressing or bathing? N  Doing errands, shopping? N  Preparing Food and eating ? N  Using the Toilet? N  In the past six months, have you accidently leaked urine? N  Do you have problems with loss of bowel control? N  Managing your Medications? N  Managing your Finances? N  Housekeeping or managing your Housekeeping? N  Some recent data might be hidden    Patient Care Team: [name], MD as PCP - General (Family Medicine) Caralyn Guile,  Josph Macho, MD as Consulting Physician (Orthopedic Surgery) Latanya Maudlin, MD as Consulting Physician (Orthopedic Surgery) Danella Sensing, MD as Consulting Physician  (Dermatology) Irene Shipper, MD as Consulting Physician (Gastroenterology) Debara Pickett Nadean Corwin, MD as Consulting Physician (Cardiology) Debara Pickett Nadean Corwin, MD as Consulting Physician (Cardiology) Wylene Simmer, MD as Consulting Physician (Orthopedic Surgery)    Assessment:   This is a routine wellness examination for [place].  Exercise Activities and Dietary recommendations Current Exercise Habits: The patient does not participate in regular exercise at present, Exercise limited by: None identified   Diet (meal preparation, eat out, water intake, caffeinated beverages, dairy products, fruits and vegetables): Drinks sparkling water, diet coke (3/week).   Attempts to eat weight watchers meals, 2/day.   Goals    . Patient Stated     Maintain health by staying active.     . Weight (lb) < 140 lb (63.5 kg)     Lose weight by continuing Weight Watchers and increasing activity.         Fall Risk Fall Risk  08/28/2017 08/01/2016 05/25/2015 03/06/2015 06/18/2014  Falls in the past year? No Yes No No No  Comment - slip on ice - - -  Number falls in past yr: - 1 - - -  Injury with Fall? - Yes - - -  Follow up - Falls prevention discussed - - -    Depression Screen PHQ 2/9 Scores 08/28/2017 08/01/2016 05/25/2015 03/06/2015  PHQ - 2 Score 0 0 0 0     Cognitive Function MMSE - Mini Mental State Exam 08/28/2017  Orientation to time 5  Orientation to Place 5  Registration 3  Attention/ Calculation 5  Recall 2  Language- name 2 objects 2  Language- repeat 1  Language- follow 3 step command 3  Language- read & follow direction 1  Write a sentence 1  Copy design 1  Total score 29        Immunization History  Administered Date(s) Administered  . H1N1 01/07/2008  . Influenza Split 11/01/2010, 11/15/2011  . Influenza Whole 12/27/2006, 11/07/2007, 10/31/2008, 12/29/2009  . Influenza, High Dose Seasonal PF 10/17/2014, 09/26/2016  . Influenza,inj,Quad PF,6+ Mos 11/20/2013  . Influenza-Unspecified  09/30/2015  . Pneumococcal Conjugate-13 07/16/2013  . Pneumococcal Polysaccharide-23 11/07/2007  . Td 12/10/2008  . Zoster 04/05/2007   Shingrix--has Rx  Screening Tests Health Maintenance  Topic Date Due  . COLONOSCOPY  05/30/2016  . INFLUENZA VACCINE  08/17/2017  . MAMMOGRAM  06/22/2018  . TETANUS/TDAP  12/11/2018  . DEXA SCAN  Completed  . PNA vac Low Risk Adult  Completed      Plan:     Schedule bone scan.   Continue doing brain stimulating activities (puzzles, reading, adult coloring books, staying active) to keep memory sharp.   Bring a copy of your living will and/or healthcare power of attorney to your next office visit.  I have personally reviewed and noted the following in the patient's chart:   . Medical and social history . Use of alcohol, tobacco or illicit drugs  . Current medications and supplements . Functional ability and status . Nutritional status . Physical activity . Advanced directives . List of other physicians . Hospitalizations, surgeries, and ER visits in previous 12 months . Vitals . Screenings to include cognitive, depression, and falls . Referrals and appointments  In addition, I have reviewed and discussed with patient certain preventive protocols, quality metrics, and best practice recommendations. A written personalized care plan for preventive services as well  as general preventive health recommendations were provided to patient.     Gerilyn Nestle, RN  08/28/2017  PCP Notes: -Plans to discuss need for colonoscopy with GI. -Will call for F/U appt with PCP  Medical screening examination/treatment/procedure(s) were performed by non-physician practitioner and as supervising physician I was immediately available for consultation/collaboration.  I agree with above assessment and plan.  Electronically Signed by: Howard Pouch, DO [facility]

## 2017-08-29 ENCOUNTER — Emergency Department (HOSPITAL_COMMUNITY): Payer: Medicare Other

## 2017-08-29 ENCOUNTER — Encounter (HOSPITAL_COMMUNITY): Payer: Self-pay

## 2017-08-29 ENCOUNTER — Other Ambulatory Visit: Payer: Self-pay

## 2017-08-29 ENCOUNTER — Inpatient Hospital Stay (HOSPITAL_COMMUNITY)
Admission: EM | Admit: 2017-08-29 | Discharge: 2017-09-11 | DRG: 871 | Disposition: A | Discharge status: Skilled Nursing Facility | Payer: Medicare Other | Attending: Internal Medicine | Admitting: Internal Medicine

## 2017-08-29 DIAGNOSIS — K754 Autoimmune hepatitis: Secondary | ICD-10-CM | POA: Diagnosis present

## 2017-08-29 DIAGNOSIS — R402140 Coma scale, eyes open, spontaneous, unspecified time: Secondary | ICD-10-CM | POA: Diagnosis present

## 2017-08-29 DIAGNOSIS — I5033 Acute on chronic diastolic (congestive) heart failure: Secondary | ICD-10-CM

## 2017-08-29 DIAGNOSIS — A419 Sepsis, unspecified organism: Secondary | ICD-10-CM | POA: Diagnosis not present

## 2017-08-29 DIAGNOSIS — J180 Bronchopneumonia, unspecified organism: Secondary | ICD-10-CM | POA: Diagnosis present

## 2017-08-29 DIAGNOSIS — I5032 Chronic diastolic (congestive) heart failure: Secondary | ICD-10-CM

## 2017-08-29 DIAGNOSIS — R0602 Shortness of breath: Secondary | ICD-10-CM | POA: Diagnosis not present

## 2017-08-29 DIAGNOSIS — I10 Essential (primary) hypertension: Secondary | ICD-10-CM | POA: Diagnosis present

## 2017-08-29 DIAGNOSIS — K7581 Nonalcoholic steatohepatitis (NASH): Secondary | ICD-10-CM | POA: Diagnosis present

## 2017-08-29 DIAGNOSIS — Z86010 Personal history of colonic polyps: Secondary | ICD-10-CM

## 2017-08-29 DIAGNOSIS — N182 Chronic kidney disease, stage 2 (mild): Secondary | ICD-10-CM | POA: Diagnosis present

## 2017-08-29 DIAGNOSIS — Z880 Allergy status to penicillin: Secondary | ICD-10-CM

## 2017-08-29 DIAGNOSIS — I2510 Atherosclerotic heart disease of native coronary artery without angina pectoris: Secondary | ICD-10-CM | POA: Diagnosis present

## 2017-08-29 DIAGNOSIS — J9621 Acute and chronic respiratory failure with hypoxia: Secondary | ICD-10-CM | POA: Diagnosis not present

## 2017-08-29 DIAGNOSIS — R0609 Other forms of dyspnea: Secondary | ICD-10-CM | POA: Diagnosis not present

## 2017-08-29 DIAGNOSIS — I4892 Unspecified atrial flutter: Secondary | ICD-10-CM | POA: Diagnosis present

## 2017-08-29 DIAGNOSIS — Z6831 Body mass index (BMI) 31.0-31.9, adult: Secondary | ICD-10-CM

## 2017-08-29 DIAGNOSIS — I4891 Unspecified atrial fibrillation: Secondary | ICD-10-CM | POA: Diagnosis not present

## 2017-08-29 DIAGNOSIS — J9811 Atelectasis: Secondary | ICD-10-CM | POA: Diagnosis present

## 2017-08-29 DIAGNOSIS — R0902 Hypoxemia: Secondary | ICD-10-CM

## 2017-08-29 DIAGNOSIS — I214 Non-ST elevation (NSTEMI) myocardial infarction: Secondary | ICD-10-CM

## 2017-08-29 DIAGNOSIS — I130 Hypertensive heart and chronic kidney disease with heart failure and stage 1 through stage 4 chronic kidney disease, or unspecified chronic kidney disease: Secondary | ICD-10-CM | POA: Diagnosis present

## 2017-08-29 DIAGNOSIS — I480 Paroxysmal atrial fibrillation: Secondary | ICD-10-CM | POA: Diagnosis not present

## 2017-08-29 DIAGNOSIS — E669 Obesity, unspecified: Secondary | ICD-10-CM | POA: Diagnosis present

## 2017-08-29 DIAGNOSIS — R531 Weakness: Secondary | ICD-10-CM

## 2017-08-29 DIAGNOSIS — Z7901 Long term (current) use of anticoagulants: Secondary | ICD-10-CM

## 2017-08-29 DIAGNOSIS — N1832 Chronic kidney disease, stage 3b: Secondary | ICD-10-CM | POA: Diagnosis present

## 2017-08-29 DIAGNOSIS — R079 Chest pain, unspecified: Secondary | ICD-10-CM | POA: Diagnosis present

## 2017-08-29 DIAGNOSIS — I481 Persistent atrial fibrillation: Secondary | ICD-10-CM | POA: Diagnosis present

## 2017-08-29 DIAGNOSIS — I952 Hypotension due to drugs: Secondary | ICD-10-CM | POA: Diagnosis not present

## 2017-08-29 DIAGNOSIS — E46 Unspecified protein-calorie malnutrition: Secondary | ICD-10-CM | POA: Diagnosis present

## 2017-08-29 DIAGNOSIS — N189 Chronic kidney disease, unspecified: Secondary | ICD-10-CM

## 2017-08-29 DIAGNOSIS — E039 Hypothyroidism, unspecified: Secondary | ICD-10-CM | POA: Diagnosis present

## 2017-08-29 DIAGNOSIS — Z8619 Personal history of other infectious and parasitic diseases: Secondary | ICD-10-CM

## 2017-08-29 DIAGNOSIS — R0789 Other chest pain: Secondary | ICD-10-CM | POA: Diagnosis not present

## 2017-08-29 DIAGNOSIS — R21 Rash and other nonspecific skin eruption: Secondary | ICD-10-CM | POA: Diagnosis present

## 2017-08-29 DIAGNOSIS — J918 Pleural effusion in other conditions classified elsewhere: Secondary | ICD-10-CM | POA: Diagnosis not present

## 2017-08-29 DIAGNOSIS — R7989 Other specified abnormal findings of blood chemistry: Secondary | ICD-10-CM | POA: Diagnosis present

## 2017-08-29 DIAGNOSIS — R6521 Severe sepsis with septic shock: Secondary | ICD-10-CM | POA: Diagnosis present

## 2017-08-29 DIAGNOSIS — I313 Pericardial effusion (noninflammatory): Secondary | ICD-10-CM | POA: Diagnosis not present

## 2017-08-29 DIAGNOSIS — K7460 Unspecified cirrhosis of liver: Secondary | ICD-10-CM | POA: Diagnosis present

## 2017-08-29 DIAGNOSIS — R402250 Coma scale, best verbal response, oriented, unspecified time: Secondary | ICD-10-CM | POA: Diagnosis present

## 2017-08-29 DIAGNOSIS — Z79899 Other long term (current) drug therapy: Secondary | ICD-10-CM

## 2017-08-29 DIAGNOSIS — Z9889 Other specified postprocedural states: Secondary | ICD-10-CM

## 2017-08-29 DIAGNOSIS — I959 Hypotension, unspecified: Secondary | ICD-10-CM

## 2017-08-29 DIAGNOSIS — J9600 Acute respiratory failure, unspecified whether with hypoxia or hypercapnia: Secondary | ICD-10-CM

## 2017-08-29 DIAGNOSIS — J90 Pleural effusion, not elsewhere classified: Secondary | ICD-10-CM

## 2017-08-29 DIAGNOSIS — D696 Thrombocytopenia, unspecified: Secondary | ICD-10-CM | POA: Diagnosis present

## 2017-08-29 DIAGNOSIS — N179 Acute kidney failure, unspecified: Secondary | ICD-10-CM | POA: Diagnosis present

## 2017-08-29 DIAGNOSIS — I9589 Other hypotension: Secondary | ICD-10-CM

## 2017-08-29 DIAGNOSIS — F410 Panic disorder [episodic paroxysmal anxiety] without agoraphobia: Secondary | ICD-10-CM | POA: Diagnosis present

## 2017-08-29 DIAGNOSIS — M8580 Other specified disorders of bone density and structure, unspecified site: Secondary | ICD-10-CM | POA: Diagnosis present

## 2017-08-29 DIAGNOSIS — E785 Hyperlipidemia, unspecified: Secondary | ICD-10-CM | POA: Diagnosis present

## 2017-08-29 DIAGNOSIS — Z7983 Long term (current) use of bisphosphonates: Secondary | ICD-10-CM

## 2017-08-29 DIAGNOSIS — D631 Anemia in chronic kidney disease: Secondary | ICD-10-CM | POA: Diagnosis present

## 2017-08-29 DIAGNOSIS — F419 Anxiety disorder, unspecified: Secondary | ICD-10-CM | POA: Diagnosis present

## 2017-08-29 DIAGNOSIS — J189 Pneumonia, unspecified organism: Secondary | ICD-10-CM

## 2017-08-29 DIAGNOSIS — R402360 Coma scale, best motor response, obeys commands, unspecified time: Secondary | ICD-10-CM | POA: Diagnosis present

## 2017-08-29 LAB — BASIC METABOLIC PANEL
Anion gap: 12 (ref 5–15)
BUN: 27 mg/dL — AB (ref 8–23)
CHLORIDE: 106 mmol/L (ref 98–111)
CO2: 24 mmol/L (ref 22–32)
CREATININE: 1.57 mg/dL — AB (ref 0.44–1.00)
Calcium: 8.4 mg/dL — ABNORMAL LOW (ref 8.9–10.3)
GFR calc non Af Amer: 30 mL/min — ABNORMAL LOW (ref >60)
GFR, EST AFRICAN AMERICAN: 35 mL/min — AB (ref >60)
Glucose, Bld: 110 mg/dL — ABNORMAL HIGH (ref 70–99)
POTASSIUM: 3.6 mmol/L (ref 3.5–5.1)
SODIUM: 142 mmol/L (ref 135–145)

## 2017-08-29 LAB — CBC WITH DIFFERENTIAL/PLATELET
Abs Immature Granulocytes: 0.1 10*3/uL (ref 0.0–0.1)
BASOS ABS: 0 10*3/uL (ref 0.0–0.1)
Basophils Relative: 0 %
EOS PCT: 0 %
Eosinophils Absolute: 0 10*3/uL (ref 0.0–0.7)
HCT: 39.2 % (ref 36.0–46.0)
Hemoglobin: 12.3 g/dL (ref 12.0–15.0)
Immature Granulocytes: 1 %
Lymphocytes Relative: 8 %
Lymphs Abs: 0.8 10*3/uL (ref 0.7–4.0)
MCH: 28.8 pg (ref 26.0–34.0)
MCHC: 31.4 g/dL (ref 30.0–36.0)
MCV: 91.8 fL (ref 78.0–100.0)
MONO ABS: 0.9 10*3/uL (ref 0.1–1.0)
Monocytes Relative: 9 %
Neutro Abs: 8.6 10*3/uL — ABNORMAL HIGH (ref 1.7–7.7)
Neutrophils Relative %: 82 %
Platelets: 121 10*3/uL — ABNORMAL LOW (ref 150–400)
RBC: 4.27 MIL/uL (ref 3.87–5.11)
RDW: 15 % (ref 11.5–15.5)
WBC: 10.4 10*3/uL (ref 4.0–10.5)

## 2017-08-29 LAB — TROPONIN I
Troponin I: <0.03 ng/mL (ref <0.03)
Troponin I: <0.03 ng/mL (ref <0.03)

## 2017-08-29 LAB — COMPREHENSIVE METABOLIC PANEL
ALT: 26 U/L (ref 0–44)
AST: 20 U/L (ref 15–41)
Albumin: 2.6 g/dL — ABNORMAL LOW (ref 3.5–5.0)
Alkaline Phosphatase: 53 U/L (ref 38–126)
Anion gap: 9 (ref 5–15)
BILIRUBIN TOTAL: 1 mg/dL (ref 0.3–1.2)
BUN: 28 mg/dL — AB (ref 8–23)
CO2: 21 mmol/L — ABNORMAL LOW (ref 22–32)
CREATININE: 1.91 mg/dL — AB (ref 0.44–1.00)
Calcium: 7.4 mg/dL — ABNORMAL LOW (ref 8.9–10.3)
Chloride: 106 mmol/L (ref 98–111)
GFR calc Af Amer: 27 mL/min — ABNORMAL LOW (ref >60)
GFR, EST NON AFRICAN AMERICAN: 24 mL/min — AB (ref >60)
Glucose, Bld: 155 mg/dL — ABNORMAL HIGH (ref 70–99)
Potassium: 3.4 mmol/L — ABNORMAL LOW (ref 3.5–5.1)
Sodium: 136 mmol/L (ref 135–145)
TOTAL PROTEIN: 5.3 g/dL — AB (ref 6.5–8.1)

## 2017-08-29 LAB — URINALYSIS, ROUTINE W REFLEX MICROSCOPIC
BILIRUBIN URINE: NEGATIVE
Glucose, UA: NEGATIVE mg/dL
HGB URINE DIPSTICK: NEGATIVE
Ketones, ur: NEGATIVE mg/dL
Leukocytes, UA: NEGATIVE
NITRITE: POSITIVE — AB
Protein, ur: NEGATIVE mg/dL
SPECIFIC GRAVITY, URINE: 1.014 (ref 1.005–1.030)
pH: 5 (ref 5.0–8.0)

## 2017-08-29 LAB — RESPIRATORY PANEL BY PCR
ADENOVIRUS-RVPPCR: NOT DETECTED
Bordetella pertussis: NOT DETECTED
CHLAMYDOPHILA PNEUMONIAE-RVPPCR: NOT DETECTED
CORONAVIRUS NL63-RVPPCR: NOT DETECTED
Coronavirus 229E: NOT DETECTED
Coronavirus HKU1: NOT DETECTED
Coronavirus OC43: NOT DETECTED
INFLUENZA A-RVPPCR: NOT DETECTED
INFLUENZA B-RVPPCR: NOT DETECTED
MYCOPLASMA PNEUMONIAE-RVPPCR: NOT DETECTED
Metapneumovirus: NOT DETECTED
Parainfluenza Virus 1: NOT DETECTED
Parainfluenza Virus 2: NOT DETECTED
Parainfluenza Virus 3: NOT DETECTED
Parainfluenza Virus 4: NOT DETECTED
RESPIRATORY SYNCYTIAL VIRUS-RVPPCR: NOT DETECTED
Rhinovirus / Enterovirus: NOT DETECTED

## 2017-08-29 LAB — CBC
HEMATOCRIT: 46.8 % — AB (ref 36.0–46.0)
Hemoglobin: 14.6 g/dL (ref 12.0–15.0)
MCH: 29 pg (ref 26.0–34.0)
MCHC: 31.2 g/dL (ref 30.0–36.0)
MCV: 92.9 fL (ref 78.0–100.0)
Platelets: 144 10*3/uL — ABNORMAL LOW (ref 150–400)
RBC: 5.04 MIL/uL (ref 3.87–5.11)
RDW: 14.7 % (ref 11.5–15.5)
WBC: 8.2 10*3/uL (ref 4.0–10.5)

## 2017-08-29 LAB — I-STAT TROPONIN, ED
Troponin i, poc: 0 ng/mL (ref 0.00–0.08)
Troponin i, poc: 0.01 ng/mL (ref 0.00–0.08)

## 2017-08-29 LAB — BRAIN NATRIURETIC PEPTIDE: B NATRIURETIC PEPTIDE 5: 1028.9 pg/mL — AB (ref 0.0–100.0)

## 2017-08-29 LAB — GLUCOSE, CAPILLARY: Glucose-Capillary: 124 mg/dL — ABNORMAL HIGH (ref 70–99)

## 2017-08-29 LAB — PROTIME-INR
INR: 1.65
PROTHROMBIN TIME: 19.3 s — AB (ref 11.4–15.2)

## 2017-08-29 LAB — PROCALCITONIN: PROCALCITONIN: 0.21 ng/mL

## 2017-08-29 LAB — LACTIC ACID, PLASMA: LACTIC ACID, VENOUS: 1.2 mmol/L (ref 0.5–1.9)

## 2017-08-29 LAB — I-STAT CG4 LACTIC ACID, ED: LACTIC ACID, VENOUS: 0.66 mmol/L (ref 0.5–1.9)

## 2017-08-29 MED ORDER — SODIUM CHLORIDE 0.9 % IV SOLN
1.0000 g | Freq: Once | INTRAVENOUS | Status: AC
  Administered 2017-08-29: 1 g via INTRAVENOUS
  Filled 2017-08-29: qty 10

## 2017-08-29 MED ORDER — RIVAROXABAN 15 MG PO TABS
15.0000 mg | ORAL_TABLET | Freq: Every day | ORAL | Status: DC
  Administered 2017-08-29 – 2017-08-30 (×2): 15 mg via ORAL
  Filled 2017-08-29 (×2): qty 1

## 2017-08-29 MED ORDER — SODIUM CHLORIDE 0.9 % IV BOLUS
500.0000 mL | Freq: Once | INTRAVENOUS | Status: AC
Start: 2017-08-29 — End: 2017-08-29
  Administered 2017-08-29: 500 mL via INTRAVENOUS

## 2017-08-29 MED ORDER — MORPHINE SULFATE (PF) 2 MG/ML IV SOLN: 2.0000 mg | INTRAVENOUS | Status: DC | PRN

## 2017-08-29 MED ORDER — ALPRAZOLAM 0.5 MG PO TABS: 0.5000 mg | ORAL_TABLET | Freq: Three times a day (TID) | ORAL | Status: DC | PRN

## 2017-08-29 MED ORDER — ACETAMINOPHEN 325 MG PO TABS
650.0000 mg | ORAL_TABLET | ORAL | Status: DC | PRN
  Administered 2017-08-29 – 2017-09-08 (×14): 650 mg via ORAL
  Filled 2017-08-29 (×15): qty 2

## 2017-08-29 MED ORDER — FLUTICASONE PROPIONATE 50 MCG/ACT NA SUSP
2.0000 | Freq: Every day | NASAL | Status: DC
  Administered 2017-08-30 – 2017-09-11 (×12): 2 via NASAL
  Filled 2017-08-29 (×2): qty 16

## 2017-08-29 MED ORDER — IPRATROPIUM-ALBUTEROL 0.5-2.5 (3) MG/3ML IN SOLN: 3.0000 mL | Freq: Four times a day (QID) | RESPIRATORY_TRACT | Status: DC

## 2017-08-29 MED ORDER — ATORVASTATIN CALCIUM 40 MG PO TABS
40.0000 mg | ORAL_TABLET | Freq: Every day | ORAL | Status: DC
  Administered 2017-08-29 – 2017-09-11 (×14): 40 mg via ORAL
  Filled 2017-08-29 (×15): qty 1

## 2017-08-29 MED ORDER — IPRATROPIUM-ALBUTEROL 0.5-2.5 (3) MG/3ML IN SOLN
3.0000 mL | Freq: Two times a day (BID) | RESPIRATORY_TRACT | Status: DC
  Administered 2017-08-29 – 2017-09-05 (×15): 3 mL via RESPIRATORY_TRACT
  Filled 2017-08-29 (×15): qty 3

## 2017-08-29 MED ORDER — SODIUM CHLORIDE 0.9 % IV BOLUS
500.0000 mL | Freq: Once | INTRAVENOUS | Status: AC
  Administered 2017-08-29: 500 mL via INTRAVENOUS

## 2017-08-29 MED ORDER — OSTEO BI-FLEX TRIPLE STRENGTH PO TABS: ORAL_TABLET | Freq: Every day | ORAL | Status: DC

## 2017-08-29 MED ORDER — METOPROLOL TARTRATE 25 MG PO TABS: 25.0000 mg | ORAL_TABLET | Freq: Once | ORAL | Status: DC

## 2017-08-29 MED ORDER — AMIODARONE HCL 200 MG PO TABS
200.0000 mg | ORAL_TABLET | Freq: Every day | ORAL | Status: DC
  Administered 2017-08-29 – 2017-08-30 (×2): 200 mg via ORAL
  Filled 2017-08-29 (×2): qty 1

## 2017-08-29 MED ORDER — GI COCKTAIL ~~LOC~~
30.0000 mL | Freq: Four times a day (QID) | ORAL | Status: DC | PRN
  Administered 2017-08-29: 30 mL via ORAL
  Filled 2017-08-29 (×2): qty 30

## 2017-08-29 MED ORDER — SODIUM CHLORIDE 0.9 % IV SOLN: 1.0000 g | INTRAVENOUS | Status: DC

## 2017-08-29 MED ORDER — RIVAROXABAN 20 MG PO TABS: 20.0000 mg | ORAL_TABLET | Freq: Every day | ORAL | Status: DC

## 2017-08-29 MED ORDER — FENTANYL CITRATE (PF) 100 MCG/2ML IJ SOLN
50.0000 ug | Freq: Once | INTRAMUSCULAR | Status: AC
  Administered 2017-08-29: 50 ug via INTRAVENOUS
  Filled 2017-08-29: qty 2

## 2017-08-29 MED ORDER — PHENYLEPHRINE HCL-NACL 10-0.9 MG/250ML-% IV SOLN
0.0000 ug/min | INTRAVENOUS | Status: DC
  Administered 2017-08-29: 20 ug/min via INTRAVENOUS
  Administered 2017-08-30: 60 ug/min via INTRAVENOUS
  Administered 2017-08-30: 20 ug/min via INTRAVENOUS
  Filled 2017-08-29 (×5): qty 250

## 2017-08-29 MED ORDER — ONDANSETRON HCL 4 MG/2ML IJ SOLN: 4.0000 mg | Freq: Four times a day (QID) | INTRAMUSCULAR | Status: DC | PRN

## 2017-08-29 MED ORDER — BENZONATATE 100 MG PO CAPS
100.0000 mg | ORAL_CAPSULE | Freq: Three times a day (TID) | ORAL | Status: DC | PRN
  Administered 2017-08-29 – 2017-09-09 (×22): 100 mg via ORAL
  Filled 2017-08-29 (×24): qty 1

## 2017-08-29 MED ORDER — METOPROLOL TARTRATE 25 MG PO TABS: 50.0000 mg | ORAL_TABLET | Freq: Once | ORAL | Status: DC

## 2017-08-29 MED ORDER — ALBUTEROL SULFATE (2.5 MG/3ML) 0.083% IN NEBU
2.5000 mg | INHALATION_SOLUTION | RESPIRATORY_TRACT | Status: DC | PRN
  Administered 2017-08-30 – 2017-09-05 (×6): 2.5 mg via RESPIRATORY_TRACT
  Filled 2017-08-29 (×6): qty 3

## 2017-08-29 MED ORDER — DOXYCYCLINE HYCLATE 100 MG PO TABS
100.0000 mg | ORAL_TABLET | Freq: Once | ORAL | Status: AC
  Administered 2017-08-29: 100 mg via ORAL
  Filled 2017-08-29: qty 1

## 2017-08-29 MED ORDER — ALBUTEROL SULFATE HFA 108 (90 BASE) MCG/ACT IN AERS: 2.0000 | INHALATION_SPRAY | RESPIRATORY_TRACT | Status: DC | PRN

## 2017-08-29 MED ORDER — DOXYCYCLINE HYCLATE 100 MG PO TABS: 100.0000 mg | ORAL_TABLET | Freq: Two times a day (BID) | ORAL | Status: DC

## 2017-08-29 MED ORDER — METOPROLOL TARTRATE 25 MG PO TABS
25.0000 mg | ORAL_TABLET | Freq: Once | ORAL | Status: AC
  Administered 2017-08-29: 25 mg via ORAL
  Filled 2017-08-29: qty 1

## 2017-08-29 NOTE — Progress Notes (Addendum)
Pt received from ED but pt was emotionally crying that I am too weak, was refusing to get up for weight, but Rn convinced her to do it. Pt feeling too weak, meanwhile BP is low, paged MD and 500cc bolus is in progress  Pt is in droplet precaution, Res. Panel needs to be collected, waiting for the flu swab from Lab this moment  Paged MD again to place the diet order for the patient  For her throat and chest pain (pt showed on her throat and down not to the chest site while being asked to show her the site), GI cocktail provided Pt is resting comfortably this point  Palma Holter, Therapist, sports

## 2017-08-29 NOTE — ED Provider Notes (Signed)
[facility] DEPART[facility] Provider Note   CSN: [id number] Arrival date & time: 08/29/17  1125     History   Chief Complaint Chief Complaint  Patient presents with  . Chest Pain  . Shortness of Breath    HPI Brenda Matthews is a 82 y.o. female.  HPI  82 year old female with history of autoimmune hepatitis and cirrhosis, paroxysmal A. fib on Xarelto comes in with chief complaint of weakness and chest discomfort.  Patient notes that she was doing well yesterday, however she woke up feeling unwell.  Patient has been having some pleuritic type upper chest pain, with a mild nonproductive cough.  Patient has had similar symptoms in the past with bronchitis.  Patient is noted to have a low-grade temperature here and she is tachycardic.  Patient reports that she has had similar weakness in the past when she is in A. Fib.  Review of system is also positive for shortness of breath.   Past Medical History:  Diagnosis Date  . Abnormal EKG approx 2008   Nuclear stress test neg;   . Anxiety    with panic  . Arthritis   . Autoimmune hepatitis ([place])   . Cataract    s/p surgery--lens implants  . Chronic renal insufficiency, stage II (mild) 12/04/2012   GFR 60s  . Cirrhosis ([place])   . Diverticulosis 2009  . Fracture of radial shaft, left, closed 11/16/10   fell down flight of stairs  . History of kidney stones   . Hx of adenomatous colonic polyps 2002   surveillance colonoscopy 2009, +polypectomy done-tubular adenoma w/out high grade.  05/2013 tubular adenomas--recall 3 yrs  . Hyperlipidemia   . Hypertension   . Low TSH level 02/18/2016   T3 norm, T4 mildly elevated--suspected sick euthyroid syndrome.  Repeat labs 06/2016: normal.  . NASH (nonalcoholic steatohepatitis)   . Osteopenia    DEXA 08/2010; repeat DEXA 02/2015 worse: fosamax started  . PAF (paroxysmal atrial fibrillation) ([place]) 02/2016   when in post-op for ankle surgery; spontaneously converted in  hosp, seen by Dr. Debara Pickett in consultation--metoprolol rate control + xarelto recommended.  Metop d/c due to hypot.  Plan to cont xarelto 20 mg qd indef due to CHAD-VASc score of 3.  A-fib w/RVR and CHF 07/2017; pt placed on amiodarone and plan for CV, but pt was in sinus rhythm when she went in for her DC CV, so she was sent home.  . Peripheral edema   . Pneumonia 2015   hx  . Rheumatic fever     Patient Active Problem List   Diagnosis Date Noted  . Chest pain 08/29/2017  . DOE (dyspnea on exertion) 08/29/2017  . Chronic anticoagulation 12/19/2016  . AKI (acute kidney injury) ([place])   . Low TSH level 02/19/2016  . PAF (paroxysmal atrial fibrillation) ([place]) 02/18/2016  . Ankle fracture, left-s/p surgery 02/18/16 02/18/2016  . Closed displaced trimalleolar fracture of left ankle 02/18/2016  . Acute bronchitis 02/10/2015  . Bronchopneumonia 12/02/2013  . Chronic renal insufficiency, stage II (mild) 12/04/2012  . Hx of adenomatous colonic polyps   . Routine general medical examination at a health care facility 09/05/2011  . Cervical cancer screening 02/24/2011  . SCIATICA, BILATERAL 09/01/2009  . SACROILIAC STRAIN, ACUTE 09/01/2009  . ANEMIA 11/07/2007  . Osteopenia of the elderly 11/07/2007  . INSOMNIA 11/07/2007  . EDEMA 08/14/2007  . Hyperlipidemia 04/04/2007  . PANIC DISORDER 04/04/2007  . Essential hypertension 04/04/2007  . Anxiety state 12/27/2006  .  ARTHRITIS 12/27/2006    Past Surgical History:  Procedure Laterality Date  . APPENDECTOMY  1966   done during surgery for tubal pregnancy  . CATARACT EXTRACTION W/ INTRAOCULAR LENS IMPLANT  2013   bilat  . COLONOSCOPY W/ POLYPECTOMY  05/2013   +diverticulosis; recall 3 yrs (Dr. Henrene Pastor)  . DEXA  02/2015   T score -2.1 in both femoral necks; FRAX 10 yr risk of major osteoporotic fracture was 21%---fosamax started  . EYE SURGERY    . OPEN REDUCTION INTERNAL FIXATION (ORIF) TIBIA/FIBULA FRACTURE Left 02/18/2016   Procedure: OPEN  REDUCTION INTERNAL FIXATION (ORIF) Right ankle trimalleolar fracture;  Surgeon: Wylene Simmer, MD;  Location: [facility];  Service: Orthopedics;  Laterality: Left;  requests 60mns  . ORIF RADIAL FRACTURE  11/18/10   left; s/p slip on slippery floor and fell  . TONSILLECTOMY    . TRANSTHORACIC ECHOCARDIOGRAM  02/18/2016; 08/10/17   LVEF of 55-60%, mild AI and mild MR and normal biatrial size.  07/2017--normal LV function, mild enlarge aortic root, mild/mod TR, bilat atrial enlargement.     OB History   None      Home Medications    Prior to Admission medications   Medication Sig Start Date End Date Taking? Authorizing Provider  acetaminophen (TYLENOL) 325 MG tablet Take 650 mg by mouth every 6 (six) hours as needed for moderate pain or headache.    Yes [provider]  alendronate (FOSAMAX) 70 MG tablet Take 1 tablet (70 mg total) by mouth every 7 (seven) days. Take with a full glass of water on an empty stomach. Patient taking differently: Take 70 mg by mouth every Monday. Take with a full glass of water on an empty stomach. 03/06/15  Yes McGowen, PAdrian Blackwater MD  ALPRAZolam (Duanne Moron 0.5 MG tablet TAKE 1 TABLET BY MOUTH THREE TIMES DAILY IF NEEDED FOR STRESS Patient taking differently: Take 0.5 mg by mouth 3 (three) times daily as needed for anxiety.  05/08/17  Yes McGowen, PAdrian Blackwater MD  Apoaequorin (PREVAGEN) 10 MG CAPS Take 10 mg by mouth daily.    Yes [provider]  atorvastatin (LIPITOR) 40 MG tablet TAKE 1 TABLET BY MOUTH ONCE DAILY 07/28/17  Yes McGowen, PAdrian Blackwater MD  calcium-vitamin D (OSCAL WITH D) 500-200 MG-UNIT per tablet Take 1 tablet by mouth every evening.    Yes [provider]  carboxymethylcellulose (REFRESH PLUS) 0.5 % SOLN Place 2 drops into both eyes daily.   Yes [provider]  Coenzyme Q10 200 MG capsule Take 200 mg by mouth every evening.    Yes [provider]  fluticasone (FLONASE) 50 MCG/ACT nasal spray Place 2 sprays into both  nostrils daily.    Yes [provider]  furosemide (LASIX) 40 MG tablet TAKE 1 TABLET BY MOUTH TWICE DAILY 08/16/17  Yes McGowen, PAdrian Blackwater MD  losartan (COZAAR) 50 MG tablet take 1 tablet by mouth once daily 07/07/16  Yes McGowen, PAdrian Blackwater MD  metoprolol succinate (TOPROL-XL) 50 MG 24 hr tablet Take 75 mg by mouth daily. Patient has 25 mg tablets- takes three daily   Yes [provider]  Misc Natural Products (OSTEO BI-FLEX TRIPLE STRENGTH PO) Take 1 tablet by mouth daily.   Yes [provider]  Misc Natural Products (TART CHERRY ADVANCED PO) Take 1 capsule by mouth daily.   Yes [provider]  Multiple Vitamins-Minerals (AIRBORNE PO) Take 1 tablet by mouth daily as needed (for vacation).    Yes [provider]  Multiple Vitamins-Minerals (CENTRUM SILVER ULTRA WOMENS) TABS Take 1 tablet by mouth every evening.    Yes [provider]  Omega-3 Fatty Acids (FISH OIL PO) Take 2 capsules by mouth every evening.    Yes [provider]  rivaroxaban (XARELTO) 20 MG TABS tablet Take 1 tablet (20 mg total) by mouth daily with supper. Patient taking differently: Take 20 mg by mouth at bedtime.  12/16/16  Yes Hilty, Nadean Corwin, MD  sodium chloride (OCEAN) 0.65 % nasal spray Place 2 sprays into the nose daily. After shower   Yes [provider]  trolamine salicylate (ASPERCREME) 10 % cream Apply 1 application topically daily.   Yes [provider]  TURMERIC PO Take 1 capsule by mouth daily.   Yes [provider]  albuterol (PROAIR HFA) 108 (90 Base) MCG/ACT inhaler INHALE 2 PUFFS INTO THE LUNGS EVERY 4 HOURS IF NEEDED FOR WHEEZING OR SHORTNESS OF BREATH(COUGH SHORTNESS OF BREATH AND WHEEZING) Patient taking differently: Inhale 2 puffs into the lungs every 4 (four) hours as needed for wheezing or shortness of breath.  07/27/17   McGowen, Adrian Blackwater, MD  amiodarone (PACERONE) 200 MG tablet Take 1 tablet (200 mg total) by mouth  daily. Patient not taking: Reported on 08/29/2017 08/18/17   Barrett, Evelene Croon, PA-C    Family History Family History  Problem Relation Age of Onset  . Heart disease Mother   . Heart disease Father   . Hypertension Brother   . Diabetes Sister   . Colon cancer Neg Hx   . Pancreatic cancer Neg Hx   . Rectal cancer Neg Hx   . Stomach cancer Neg Hx     Social History Social History   Tobacco Use  . Smoking status: Never Smoker  . Smokeless tobacco: Never Used  Substance Use Topics  . Alcohol use: Yes    Comment: rarely  . Drug use: No     Allergies   Augmentin [amoxicillin-pot clavulanate]   Review of Systems Review of Systems  Constitutional: Positive for activity change.  Respiratory: Positive for shortness of breath.   Cardiovascular: Positive for chest pain.  Allergic/Immunologic: Positive for immunocompromised state.  Hematological: Bruises/bleeds easily.  All other systems reviewed and are negative.    Physical Exam Updated Vital Signs BP (!) 97/56   Pulse 92   Temp (!) 100.6 F (38.1 C) (Oral)   Resp 16   Ht 5' 2"  (1.575 m)   Wt 75.8 kg   LMP  (LMP Unknown)   SpO2 92%   BMI 30.54 kg/m   Physical Exam  Constitutional: She is oriented to person, place, and time. She appears well-developed.  HENT:  Head: Normocephalic and atraumatic.  Eyes: EOM are normal.  Neck: Normal range of motion. Neck supple.  Cardiovascular: Normal rate.  Pulmonary/Chest: Effort normal.  Abdominal: Bowel sounds are normal.  Neurological: She is alert and oriented to person, place, and time.  Skin: Skin is warm and dry.  Nursing note and vitals reviewed.    ED Treatments / Results  Labs (all labs ordered are listed, but only abnormal results are displayed) Labs Reviewed  BASIC METABOLIC PANEL - Abnormal; Notable for the following components:      Result Value   Glucose, Bld 110 (*)    BUN 27 (*)    Creatinine, Ser 1.57 (*)    Calcium 8.4 (*)    GFR calc non Af  Amer 30 (*)    GFR calc Af Wyvonnia Lora  35 (*)    All other components within normal limits  CBC - Abnormal; Notable for the following components:   HCT 46.8 (*)    Platelets 144 (*)    All other components within normal limits  PROTIME-INR - Abnormal; Notable for the following components:   Prothrombin Time 19.3 (*)    All other components within normal limits  TROPONIN I  URINALYSIS, ROUTINE W REFLEX MICROSCOPIC  I-STAT TROPONIN, ED  I-STAT TROPONIN, ED  I-STAT CG4 LACTIC ACID, ED    EKG EKG Interpretation  Date/Time:  Tuesday August 29 2017 11:31:23 EDT Ventricular Rate:  104 PR Interval:    QRS Duration: 104 QT Interval:  365 QTC Calculation: 501 R Axis:   -71 Text Interpretation:  Atrial flutter Inferior infarct, old Consider anterior infarct Lateral leads are also involved Prolonged QT interval No acute changes Confirmed by Varney Biles ([id number]) on 08/29/2017 12:04:42 PM   Radiology Dg Chest 2 View  Result Date: 08/29/2017 CLINICAL DATA:  Chest pain and shortness of breath atrial fibrillation. EXAM: CHEST - 2 VIEW COMPARISON:  08/04/2017 and 08/01/2017 and 02/19/2016 FINDINGS: Chronic cardiomegaly.  Aortic atherosclerosis. Pulmonary vascularity is normal. No infiltrates or effusions. Slight chronic accentuation of the thoracic kyphosis. IMPRESSION: Chronic cardiomegaly.  No acute abnormalities. Aortic Atherosclerosis (ICD10-I70.0). Electronically Signed   By: Lorriane Shire M.D.   On: 08/29/2017 12:08    Procedures Procedures (including critical care time)  Medications Ordered in ED Medications  cefTRIAXone (ROCEPHIN) 1 g in sodium chloride 0.9 % 100 mL IVPB (1 g Intravenous New Bag/Given 08/29/17 1521)  acetaminophen (TYLENOL) tablet 650 mg (has no administration in time range)  ondansetron (ZOFRAN) injection 4 mg (has no administration in time range)  morphine 2 MG/ML injection 2 mg (has no administration in time range)  gi cocktail (Maalox,Lidocaine,Donnatal) (has no  administration in time range)  metoprolol tartrate (LOPRESSOR) tablet 25 mg (has no administration in time range)  metoprolol tartrate (LOPRESSOR) tablet 25 mg (25 mg Oral Given 08/29/17 1238)  sodium chloride 0.9 % bolus 500 mL (0 mLs Intravenous Stopped 08/29/17 1517)  fentaNYL (SUBLIMAZE) injection 50 mcg (50 mcg Intravenous Given 08/29/17 1413)  doxycycline (VIBRA-TABS) tablet 100 mg (100 mg Oral Given 08/29/17 1521)     Initial Impression / Assessment and Plan / ED Course  I have reviewed the triage vital signs and the nursing notes.  Pertinent labs & imaging results that were available during my care of the patient were reviewed by me and considered in my medical decision making (see chart for details).  Clinical Course as of Aug 30 1538  Tue Aug 29, 2017  1300 Patient does have a low-grade temperature. However, I do not think patient has severe sepsis.  Her white count is within normal limits and her x-ray is not indicative of a large infiltrate.  Patient's lung exam is also benign.  I think there could be an element of viral infection or an atypical pneumonia that is not being picked up on the x-ray -but we do not think patient has severe sepsis.  We will discuss the case with the hospitalist before starting antibiotics.   [AN]    Clinical Course User Index [AN] Varney Biles, MD    82 year old female comes in with chief complaint of chest discomfort and weakness. Patient is noted to have A. Fib, she is not into RVR, but certainly is tachycardic.  Patient appears to have paroxysmal A. fib, and when she is in A. fib that is  persistent and she typically gets symptomatic.  It is possible that patient's chest discomfort is because of her paroxysmal A. fib.  She is however noted to have a low-grade temperature as well, and there could be an underlying infection that is pushing her into A. Fib.  Review of system is positive only for a pleuritic chest discomfort with previous history of  bronchitis with similar symptoms.  Otherwise there is no UTI-like symptoms, no vomiting, no signs of dehydration and patient has been taking her medications as prescribed.  We will give oral dose of metoprolol to see if he can get heart rate a little better. Patient will be monitored closely and we will make frequent reassessment prior to making a disposition.  Final Clinical Impressions(s) / ED Diagnoses   Final diagnoses:  Community acquired pneumonia, unspecified laterality  Atrial fibrillation and flutter ([place])  Nonspecific chest pain  Generalized weakness    ED Discharge Orders    None       Varney Biles, MD 08/29/17 [phone]

## 2017-08-29 NOTE — H&P (Signed)
History and Physical    Brenda Matthews KNL:[id number] DOB: 29-May-1935 DOA: 08/29/2017  PCP: [name], MD Patient coming from: home  Chief Complaint: chest pain/sob/generalized weakness  HPI: Brenda Matthews is a very pleasant 82 y.o. female with medical history significant for paroxysmal A. Fib on anticoagulation, hypertension, hyperlipidemia, chronic renal insufficiency stage II, autoimmune hepatitis presents to the emergency department with the chief complaint of chest pain shortness of breath generalized weakness. Initial evaluation reveals max temp of 100.6, EKG with atrial fibrillation and heart rate of 106, acute kidney injury. [facility] are asked to admit  Information is obtained from the patient and the chart. She states she was in her usual state of health until this morning she awakened with chest discomfort. She describes this discomfort as a "pressure" and is located across the chest just below clavicles on left and right. She says it's worse when she talked and when she touches that area of her chest. Associated symptoms include generalized weakness shortness of breath moist sounding but nonproductive cough. She states over the last couple of weeks her heart rhythm has been going "in and out of A. Fib". She has had her metoprolol adjusted and her Lasix adjusted. She states she had an appointment for cardioversion but her rhythm converted on its own so that was canceled. He denies any feeling of palpitations headache dizziness syncope or near-syncope. She denies nausea vomiting diarrhea constipation melena bright red blood per rectum. She denies dysuria hematuria frequency or urgency. She denies lower extremity edema or arthropathy at.    ED Course: in the emergency department max temperature is 100.6 orally, tachypnea, is not hypoxic, EKG with atrial fib at a rate of 109 blood pressure lower end of normal. She is provided with analgesia 500 mL normal saline 25 mg of  metoprolol as well as antibiotics  Review of Systems: As per HPI otherwise all other systems reviewed and are negative.   Ambulatory Status: ambulates independently is independent with ADLs  Past Medical History:  Diagnosis Date  . Abnormal EKG approx 2008   Nuclear stress test neg;   . Anxiety    with panic  . Arthritis   . Autoimmune hepatitis ([place])   . Cataract    s/p surgery--lens implants  . Chronic renal insufficiency, stage II (mild) 12/04/2012   GFR 60s  . Cirrhosis ([place])   . Diverticulosis 2009  . Fracture of radial shaft, left, closed 11/16/10   fell down flight of stairs  . History of kidney stones   . Hx of adenomatous colonic polyps 2002   surveillance colonoscopy 2009, +polypectomy done-tubular adenoma w/out high grade.  05/2013 tubular adenomas--recall 3 yrs  . Hyperlipidemia   . Hypertension   . Low TSH level 02/18/2016   T3 norm, T4 mildly elevated--suspected sick euthyroid syndrome.  Repeat labs 06/2016: normal.  . NASH (nonalcoholic steatohepatitis)   . Osteopenia    DEXA 08/2010; repeat DEXA 02/2015 worse: fosamax started  . PAF (paroxysmal atrial fibrillation) ([place]) 02/2016   when in post-op for ankle surgery; spontaneously converted in hosp, seen by Dr. Debara Pickett in consultation--metoprolol rate control + xarelto recommended.  Metop d/c due to hypot.  Plan to cont xarelto 20 mg qd indef due to CHAD-VASc score of 3.  A-fib w/RVR and CHF 07/2017; pt placed on amiodarone and plan for CV, but pt was in sinus rhythm when she went in for her DC CV, so she was sent home.  . Peripheral edema   .  Pneumonia 2015   hx  . Rheumatic fever     Past Surgical History:  Procedure Laterality Date  . APPENDECTOMY  1966   done during surgery for tubal pregnancy  . CATARACT EXTRACTION W/ INTRAOCULAR LENS IMPLANT  2013   bilat  . COLONOSCOPY W/ POLYPECTOMY  05/2013   +diverticulosis; recall 3 yrs (Dr. Henrene Pastor)  . DEXA  02/2015   T score -2.1 in both femoral necks; FRAX 10 yr risk  of major osteoporotic fracture was 21%---fosamax started  . EYE SURGERY    . OPEN REDUCTION INTERNAL FIXATION (ORIF) TIBIA/FIBULA FRACTURE Left 02/18/2016   Procedure: OPEN REDUCTION INTERNAL FIXATION (ORIF) Right ankle trimalleolar fracture;  Surgeon: Wylene Simmer, MD;  Location: [facility];  Service: Orthopedics;  Laterality: Left;  requests 82mns  . ORIF RADIAL FRACTURE  11/18/10   left; s/p slip on slippery floor and fell  . TONSILLECTOMY    . TRANSTHORACIC ECHOCARDIOGRAM  02/18/2016; 08/10/17   LVEF of 55-60%, mild AI and mild MR and normal biatrial size.  07/2017--normal LV function, mild enlarge aortic root, mild/mod TR, bilat atrial enlargement.    Social History   Socioeconomic History  . Marital status: Widowed    Spouse name: Not on file  . Number of children: Not on file  . Years of education: Not on file  . Highest education level: Not on file  Occupational History  . Not on file  Social Needs  . Financial resource strain: Not on file  . Food insecurity:    Worry: Not on file    Inability: Not on file  . Transportation needs:    Medical: Not on file    Non-medical: Not on file  Tobacco Use  . Smoking status: Never Smoker  . Smokeless tobacco: Never Used  Substance and Sexual Activity  . Alcohol use: Yes    Comment: rarely  . Drug use: No  . Sexual activity: Not on file  Lifestyle  . Physical activity:    Days per week: Not on file    Minutes per session: Not on file  . Stress: Not on file  Relationships  . Social connections:    Talks on phone: Not on file    Gets together: Not on file    Attends religious service: Not on file    Active member of club or organization: Not on file    Attends meetings of clubs or organizations: Not on file    Relationship status: Not on file  . Intimate partner violence:    Fear of current or ex partner: Not on file    Emotionally abused: Not on file    Physically abused: Not on file    Forced sexual activity: Not on file    Other Topics Concern  . Not on file  Social History Narrative   Widow, 2 sons.   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  Rare alcohol.   No drugs.  Exercise: 4 times per week, about 45m    Allergies  Allergen Reactions  . Augmentin [Amoxicillin-Pot Clavulanate] Nausea And Vomiting and Other (See Comments)    "projectile vomiting" Has patient had a PCN reaction causing immediate rash, facial/tongue/throat swelling, SOB or lightheadedness with hypotension:No Has patient had a PCN reaction causing severe rash involving mucus membranes or skin necrosis:No Has patient had a PCN reaction that required hospitalization:No Has patient had a PCN reaction occurring within the last 10 years:Yes If all of the above answers are "NO", then may  proceed with Cephalosporin use.     Family History  Problem Relation Age of Onset  . Heart disease Mother   . Heart disease Father   . Hypertension Brother   . Diabetes Sister   . Colon cancer Neg Hx   . Pancreatic cancer Neg Hx   . Rectal cancer Neg Hx   . Stomach cancer Neg Hx     Prior to Admission medications   Medication Sig Start Date End Date Taking? Authorizing Provider  acetaminophen (TYLENOL) 325 MG tablet Take 650 mg by mouth every 6 (six) hours as needed for moderate pain or headache.     [provider]  albuterol (PROAIR HFA) 108 (90 Base) MCG/ACT inhaler INHALE 2 PUFFS INTO THE LUNGS EVERY 4 HOURS IF NEEDED FOR WHEEZING OR SHORTNESS OF BREATH(COUGH SHORTNESS OF BREATH AND WHEEZING) Patient taking differently: Inhale 2 puffs into the lungs every 4 (four) hours as needed for wheezing or shortness of breath.  07/27/17   McGowen, Adrian Blackwater, MD  alendronate (FOSAMAX) 70 MG tablet Take 1 tablet (70 mg total) by mouth every 7 (seven) days. Take with a full glass of water on an empty stomach. Patient taking differently: Take 70 mg by mouth every Monday. Take with a full glass of water on an empty stomach. 03/06/15   McGowen,  Adrian Blackwater, MD  ALPRAZolam Duanne Moron) 0.5 MG tablet TAKE 1 TABLET BY MOUTH THREE TIMES DAILY IF NEEDED FOR STRESS Patient taking differently: Take 0.5 mg by mouth 3 (three) times daily as needed for anxiety.  05/08/17   McGowen, Adrian Blackwater, MD  amiodarone (PACERONE) 200 MG tablet Take 1 tablet (200 mg total) by mouth daily. 08/18/17   Barrett, Evelene Croon, PA-C  Apoaequorin (PREVAGEN) 10 MG CAPS Take 10 mg by mouth daily.     [provider]  atorvastatin (LIPITOR) 40 MG tablet TAKE 1 TABLET BY MOUTH ONCE DAILY 07/28/17   McGowen, Adrian Blackwater, MD  calcium-vitamin D (OSCAL WITH D) 500-200 MG-UNIT per tablet Take 1 tablet by mouth every evening.     [provider]  carboxymethylcellulose (REFRESH PLUS) 0.5 % SOLN Place 2 drops into both eyes daily.    [provider]  Coenzyme Q10 200 MG capsule Take 200 mg by mouth every evening.     [provider]  fluticasone (FLONASE) 50 MCG/ACT nasal spray Place 2 sprays into both nostrils daily.     [provider]  furosemide (LASIX) 40 MG tablet TAKE 1 TABLET BY MOUTH TWICE DAILY 08/16/17   McGowen, Adrian Blackwater, MD  losartan (COZAAR) 50 MG tablet take 1 tablet by mouth once daily 07/07/16   McGowen, Adrian Blackwater, MD  metoprolol succinate (TOPROL-XL) 50 MG 24 hr tablet Take 75 mg by mouth daily. Patient has 25 mg tablets- takes three daily    [provider]  Misc Natural Products (OSTEO BI-FLEX TRIPLE STRENGTH PO) Take 1 tablet by mouth daily.    [provider]  Misc Natural Products (TART CHERRY ADVANCED PO) Take 1 capsule by mouth daily.    [provider]  Multiple Vitamins-Minerals (AIRBORNE PO) Take 1 tablet by mouth daily as needed (for vacation).     [provider]  Multiple Vitamins-Minerals (CENTRUM SILVER ULTRA WOMENS) TABS Take 1 tablet by mouth every evening.     [provider]  Omega-3 Fatty Acids (FISH OIL) 1200 MG CAPS Take 1,200 mg by mouth every evening.     [provider]  rivaroxaban Alveda Reasons)  20 MG TABS tablet Take 1 tablet (20 mg total) by mouth daily with supper. Patient taking differently: Take 20 mg by mouth at bedtime.  12/16/16   [name], MD  sodium chloride (OCEAN) 0.65 % nasal spray Place 1 spray into the nose daily. After shower    [provider]  trolamine salicylate (ASPERCREME) 10 % cream Apply 1 application topically daily.    [provider]  TURMERIC PO Take 1 capsule by mouth daily.    [provider]    Physical Exam: Vitals:   08/29/17 1315 08/29/17 1330 08/29/17 1345 08/29/17 1400  BP: 102/72 112/65 107/69 108/69  Pulse: 95 100 (!) 106 (!) 102  Resp: (!) 35 (!) 37 (!) 27   Temp:      TempSrc:      SpO2: 94% 94% 92% 94%  Weight:      Height:         General:  Appears calm, somewhat lethargic slightly pale in no acute distress Eyes:  PERRL, EOMI, normal lids, iris ENT:  grossly normal hearing, lips & tongue, mucous membranes of her mouth are pink but dry Neck:  no LAD, masses or thyromegaly Cardiovascular:  Irregularly irregular, heart sounds are distant, no m/r/g. No LE edema.  Respiratory:  Mild increased work of breathing with conversation frequent wet sounding nonproductive cough during exam breath sounds are quite diminished particularly in bilateral bases Abdomen:  soft, ntnd, positive bowel sounds throughout no guarding or rebounding Skin:  no rash or induration seen on limited exam Musculoskeletal:  grossly normal tone BUE/BLE, good ROM, no bony abnormality Psychiatric:  grossly normal mood and affect, speech fluent and appropriate, AOx3 Neurologic:  CN 2-12 grossly intact, moves all extremities in coordinated fashion, sensation intact  Labs on Admission: I have personally reviewed following labs and imaging studies  CBC: Recent Labs  Lab 08/29/17 1145  WBC 8.2  HGB 14.6  HCT 46.8*  MCV 92.9  PLT 628*   Basic Metabolic Panel: Recent Labs  Lab 08/29/17 1145   NA 142  K 3.6  CL 106  CO2 24  GLUCOSE 110*  BUN 27*  CREATININE 1.57*  CALCIUM 8.4*   GFR: Estimated Creatinine Clearance: 27.3 mL/min (A) (by C-G formula based on SCr of 1.57 mg/dL (H)). Liver Function Tests: No results for input(s): AST, ALT, ALKPHOS, BILITOT, PROT, ALBUMIN in the last 168 hours. No results for input(s): LIPASE, AMYLASE in the last 168 hours. No results for input(s): AMMONIA in the last 168 hours. Coagulation Profile: Recent Labs  Lab 08/29/17 1145  INR 1.65   Cardiac Enzymes: No results for input(s): CKTOTAL, CKMB, CKMBINDEX, TROPONINI in the last 168 hours. BNP (last 3 results) Recent Labs    08/01/17 1017 08/04/17 1447  PROBNP 631.0* 574.0*   HbA1C: No results for input(s): HGBA1C in the last 72 hours. CBG: No results for input(s): GLUCAP in the last 168 hours. Lipid Profile: No results for input(s): CHOL, HDL, LDLCALC, TRIG, CHOLHDL, LDLDIRECT in the last 72 hours. Thyroid Function Tests: No results for input(s): TSH, T4TOTAL, FREET4, T3FREE, THYROIDAB in the last 72 hours. Anemia Panel: No results for input(s): VITAMINB12, FOLATE, FERRITIN, TIBC, IRON, RETICCTPCT in the last 72 hours. Urine analysis:    Component Value Date/Time   COLORURINE YELLOW 02/19/2016 1408   APPEARANCEUR HAZY (A) 02/19/2016 1408   LABSPEC 1.014 02/19/2016 1408   PHURINE 5.0 02/19/2016 1408   GLUCOSEU NEGATIVE 02/19/2016 1408   HGBUR NEGATIVE 02/19/2016 1408  HGBUR negative 12/10/2008 0000   BILIRUBINUR NEGATIVE 02/19/2016 1408   BILIRUBINUR negative 08/21/2014 1204   KETONESUR NEGATIVE 02/19/2016 1408   PROTEINUR NEGATIVE 02/19/2016 1408   UROBILINOGEN 0.2 08/21/2014 1204   UROBILINOGEN 0.2 12/10/2008 0000   NITRITE NEGATIVE 02/19/2016 1408   LEUKOCYTESUR MODERATE (A) 02/19/2016 1408    Creatinine Clearance: Estimated Creatinine Clearance: 27.3 mL/min (A) (by C-G formula based on SCr of 1.57 mg/dL (H)).  Sepsis  Labs: @LABRCNTIP (procalcitonin:4,lacticidven:4) )No results found for this or any previous visit (from the past 240 hour(s)).   Radiological Exams on Admission: Dg Chest 2 View  Result Date: 08/29/2017 CLINICAL DATA:  Chest pain and shortness of breath atrial fibrillation. EXAM: CHEST - 2 VIEW COMPARISON:  08/04/2017 and 08/01/2017 and 02/19/2016 FINDINGS: Chronic cardiomegaly.  Aortic atherosclerosis. Pulmonary vascularity is normal. No infiltrates or effusions. Slight chronic accentuation of the thoracic kyphosis. IMPRESSION: Chronic cardiomegaly.  No acute abnormalities. Aortic Atherosclerosis (ICD10-I70.0). Electronically Signed   By: Lorriane Shire M.D.   On: 08/29/2017 12:08    EKG: Independently reviewed. Atrial flutter Inferior infarct, old Consider anterior infarct Lateral leads are also involved  Assessment/Plan Principal Problem:   Chest pain Active Problems:   PAF (paroxysmal atrial fibrillation) (HCC)   DOE (dyspnea on exertion)   Hyperlipidemia   Essential hypertension   Chronic renal insufficiency, stage II (mild)   Chronic anticoagulation   1. Chest pain/paroxysmal atrial fibrillation with poor rate control/DOE. Atypical and reproducable chest pain. Etiology remains unclear. Doubt ACS as EKG as noted above and initial troponin neg. Some concern for infectious process vs acute diastolic HF in setting of poorly controlled hr in atrial fib.  Chest x-ray with chronic cardiomegaly no acute abnormalities. She was febrile on presentation and BP somewhat soft. No leukocytosis,  Lactic acid within normal limits. HR range 109-120 in afib.  Recent echo with EF 50% with mild LVH. She has tachypnea but not hypoxic. She was provided with iv fluids antibiotics and low dose metoprolol at [facility]. Pain free at admission. Of note recent visit with cardiology and cardioversion planned that she converted on her own. Prior to this metoprolol dose increased to 75 mg and Lasix dose increased as  well -admit to telemetry -cycle troponin -Serial EKGs -respiratory viral panel -continue antibiotics -continue xarelto -continue amiodarone -repeat chest x-ray in the a.m. -nebulizer -Continue home metoprolol with parameters -holding lasix due to soft BP -obtain daily weight -intak and output -if no improvement consider cards consult  2. hypertension. Blood pressure somewhat soft upon resuscitation  in the emergency department. Home medications include metoprolol,Lasix, losartan -hold Lasix and losartan for now -Continue metoprolol with parameters -Monitor  #3. Chronic renal insufficiency. Creatinine 1.57 on admission. Chart review indicates this is above her baseline. -Hold nephrotoxins -Urine output -Recheck in the morning  #4. Hyperlipidemia. -Continue statin   DVT prophylaxis: xaerlto  Code Status: full  Family Communication: none present  Disposition Plan: home  Consults called: none  Admission status: obs    Delita Chiquito M NP [facility]  If 7PM-7AM, please contact night-coverage [email or web address] Password [facility]  08/29/2017, 3:04 PM

## 2017-08-29 NOTE — ED Triage Notes (Signed)
Pt arrives to ED from home with complaints of chest pain thqat starts in her throat and radiates down into her chest as well as shortness of breath with exertion since this morning. EMS reports pt was in afib upon arrival, pt has hx of same and is on xarelto. Pt placed in position of comfort with bed locked and lowered, call bell in reach.

## 2017-08-29 NOTE — ED Notes (Signed)
ED Provider at bedside. 

## 2017-08-29 NOTE — Progress Notes (Signed)
CRITICAL VALUE ALERT  Critical Value:  BP of 80/38 and 78/45 manually  Date & Time Notied:  08/29/17 at [place]  Provider Notified: Provider on call for [facility]  Orders Received/Actions taken: Received  Order for NS 500 ml bolus

## 2017-08-29 NOTE — ED Notes (Signed)
Patient transported to X-ray 

## 2017-08-29 NOTE — Progress Notes (Signed)
Shift event: Pt hypotensive and received 500cc bolus on days. BP still in the 70s. Pt was euvolemic on admission, so another 500cc bolus ordered. BP refractory to IVFs. NP to bedside.  S: pt feels better than earlier today. Still feels weak, but much improved. Denies dizziness, lightheadedness, syncope, presyncope. + chest pain (which she came in with) to left sternum, rated a 6/10, gone after Tylenol.  No n/v, bleeding, pain anywhere else. States had the hives a week ago, no pain or with it. Doesn't get often. Denies tick bite.  O: Well appearing elderly WF in NAD. BP 70s. Slightly bradycardic to the 70s on rate. EKG without acute changes-rate 79, sinus rhythm. RR 16 and breathing without increased WOB. Alert and oriented. Card: S1S2, NSR. Lungs-crackles at right base, otherwise clear and good air exchange. Minor JVD and 1+ edema to tib/fib area, none to feet. No focal neuro deficits.  A/P: 1. Hypotension-? Etiology. Procalc and LA on admission were neg. No leukocytosis. Afebrile now. On Metoprolol and Amio for hx of Afib and ? Whether she was dosed too close together today or whether she took meds prior to coming to hospital and then received them here. We are holding her Lasix and Losartan. CMP, LA, and CBC ordered. Neo started through peripheral IV in the room and BP up to 90s with MAP of 67 on 60 of Neo. PCCM called and came to see pt. Pt transferred to ICU and care assumed by PCCM. 2. Rash-seen upon turning in ICU.Denies tick bite. Said she had the hives last week. Rash does not itch or hurt. Appears on lower abdomen and thighs. PCCM to workup. Pt on Doxy.  3. CHF-no exacerbation at this time. Pro BNP ordered.  KJKG, NP [facility]  Total critical care time: 60 minutes Critical care time was exclusive of separately billable procedures and treating other patients. Critical care was necessary to treat or prevent imminent or life-threatening deterioration. Critical care was time spent personally by me on  the following activities: development of treatment plan with patient and/or surrogate as well as nursing, discussions with consultants, evaluation of patient's response to treatment, examination of patient, obtaining history from patient or surrogate, ordering and performing treatments and interventions, ordering and review of laboratory studies, ordering and review of radiographic studies, pulse oximetry and re-evaluation of patient's condition.

## 2017-08-29 NOTE — Progress Notes (Signed)
Bladder scan shows 0 retaining  Resp panel sent  UA unable to sent, pt has not void so far  current BP 90/60 (Bolus in progress)  Admission assessment and history completed  Palma Holter, RN

## 2017-08-29 NOTE — Significant Event (Addendum)
Rapid Response Event Note  Overview: Cardiac -- Hypotension and Chest Pain  Initial Focused Assessment: Called by RN about SBP in the 60-70s, patient had already received 500cc NS x 1 on day shift and is currently finishing a 2nd 500cc NS bolus. SBP in the 70s, MAPs in the 50s, skin very cool to touch, clammy, patient endorses that chest pressure 5/10, got APAP, and is chest pressure is now a 3/10. Lung sounds were clear in all lung fields, not acute distress, + 1 edema in BLE, and JVD noted bilaterally. HR in the 70s, SR. Neuro intact, follow commands and is oriented, patient feels very weak and overall just doesn't feel well.   Interventions: - EKG STAT - 1L NS bolus given - 2L oxygen placed via [place] for comfort.  - PIV x 1 placed 20G RFA - Neo-Synephrine drip started at 75mg/kg/min - STAT LABS ordered - Bladder scan: ~ 425cc in bladder - will place foley and send UA  [facility]: [facility], awaiting page back, [facility] NP came to bedside, overall patient states she feels better but is still hypotensive. Patient was started on Neo-Syn at 256m/kg/min at 2207 and will transfer to ICU - [facility] - 400 NS bolus given and then transferred to 2M14.   Event Summary:    at    Call Time 2037 Arrival Time 2040 End Time 2350  Brenda Matthews R

## 2017-08-29 NOTE — Consult Note (Signed)
PULMONARY / CRITICAL CARE MEDICINE   Name: Brenda Matthews MRN: [id number] DOB: 17-Jun-1935    ADMISSION DATE:  08/29/2017 CONSULTATION DATE:  08/29/2017  REFERRING MD:  Dr. Lorin Mercy  CHIEF COMPLAINT:  Hypotension  HISTORY OF PRESENT ILLNESS:   82 year old female with PMH significant for but not limited to never smoker, Afib on Xarelto and amiodarone, HTN, HLD, autoimmune hepattis, CKD, NASH, hypothyroidism who presented to hospital after not feeling well, with onset on of chills today, nonproductive coughing episodes with shortness of breath chest chest wall pain.  Chest wall pain would change with position and reproducible.    States she was in her normal state of health prior to today but recalls having an allergic reaction last week.  Broke out "hives" on her arms, back, abdomen, and legs, however wasn't pruritic, painful, or fluid filled.  She did not seek treatment for this and said her rash has been slow to resolve.  Denies any recent tick exposure, skin allergies, or tongue/ facial swelling. She does states she's had this similar rash before about 6 years ago in which she was seen in ER (08/16/2012), unclear what it was, but was treated with doxycycline and it cleared.     She reports similar episode of dry coughing and shortness of breath episode around a month ago, exertional that improved with rest and some orthopnea and episodes of gasping at night.  She was seen and treated with doxycyline in which she completed.  Additionally, they increased her lasix after her CXR showed mild HF and small pleural effusions and she felt much better.  On chart review, her weights in July 178, 176, to now today at 172.  She had an TTE on 08/10/17 which showed normal systolic function.   On admit she was found to have temp of 100.6, mild tachypnea, SR HR, and intermittent soft blood pressures which have improved after three NS 500 ml boluses.  On chart review, she did receive lopressor 62m at 1238 and amiodarone  200 mg, both with subsequent bouts of hypotension. Labs noted for sCr 1.57 -> 1.91, negative troponin x 2 thus far, no changes on EKG, BNP noted at 1028, Lactic 0.66 -> 1.2, negative RVP, PCT 0.21, normal coags, normal WBC, no elevated eosinophils Platelets 121.  She did have bibasilar crackles but no significant hypoxia.  CXR noted no acute abnormalities or effusions.  Empirically given ceftriaxone and doxycycline.  [facility] A[facility] which resolved with improvement in blood pressure. Additionally she complained of 5/10 chest pressure that resolved with tylenol.  Given additional fluid bolus (total of 1.5L), a foley was placed, and required neosynephrine support and transferred to ICU.  Therefore, PCCM called for further medical management.  PAST MEDICAL HISTORY :  She  has a past medical history of Abnormal EKG (approx 2008), Anxiety, Arthritis, Autoimmune hepatitis ([place], Cataract, Chronic renal insufficiency, stage II (mild) (12/04/2012), Cirrhosis ([place], Diverticulosis (2009), Fracture of radial shaft, left, closed (11/16/10), History of kidney stones, adenomatous colonic polyps (2002), Hyperlipidemia, Hypertension, Low TSH level (02/18/2016), NASH (nonalcoholic steatohepatitis), Osteopenia, PAF (paroxysmal atrial fibrillation) ([place] (02/2016), Peripheral edema, Pneumonia (2015), and Rheumatic fever.  PAST SURGICAL HISTORY: She  has a past surgical history that includes Tonsillectomy; ORIF radial fracture (11/18/10); Cataract extraction w/ intraocular lens implant (2013); Colonoscopy w/ polypectomy (05/2013); DEXA (02/2015); Eye surgery; Appendectomy (1966); Open reduction internal fixation (orif) tibia/fibula fracture (Left, 02/18/2016); and transthoracic echocardiogram (02/18/2016; 08/10/17).  Allergies  Allergen  Reactions  . Augmentin [Amoxicillin-Pot Clavulanate] Nausea And Vomiting and Other (See Comments)    "projectile vomiting" Has patient had a PCN  reaction causing immediate rash, facial/tongue/throat swelling, SOB or lightheadedness with hypotension:No Has patient had a PCN reaction causing severe rash involving mucus membranes or skin necrosis:No Has patient had a PCN reaction that required hospitalization:No Has patient had a PCN reaction occurring within the last 10 years:Yes If all of the above answers are "NO", then may proceed with Cephalosporin use.     No current facility-administered medications on file prior to encounter.    Current Outpatient Medications on File Prior to Encounter  Medication Sig  . acetaminophen (TYLENOL) 325 MG tablet Take 650 mg by mouth every 6 (six) hours as needed for moderate pain or headache.   . alendronate (FOSAMAX) 70 MG tablet Take 1 tablet (70 mg total) by mouth every 7 (seven) days. Take with a full glass of water on an empty stomach. (Patient taking differently: Take 70 mg by mouth every Monday. Take with a full glass of water on an empty stomach.)  . ALPRAZolam (XANAX) 0.5 MG tablet TAKE 1 TABLET BY MOUTH THREE TIMES DAILY IF NEEDED FOR STRESS (Patient taking differently: Take 0.5 mg by mouth 3 (three) times daily as needed for anxiety. )  . Apoaequorin (PREVAGEN) 10 MG CAPS Take 10 mg by mouth daily.   Marland Kitchen atorvastatin (LIPITOR) 40 MG tablet TAKE 1 TABLET BY MOUTH ONCE DAILY  . calcium-vitamin D (OSCAL WITH D) 500-200 MG-UNIT per tablet Take 1 tablet by mouth every evening.   . carboxymethylcellulose (REFRESH PLUS) 0.5 % SOLN Place 2 drops into both eyes daily.  . Coenzyme Q10 200 MG capsule Take 200 mg by mouth every evening.   . fluticasone (FLONASE) 50 MCG/ACT nasal spray Place 2 sprays into both nostrils daily.   . furosemide (LASIX) 40 MG tablet TAKE 1 TABLET BY MOUTH TWICE DAILY  . losartan (COZAAR) 50 MG tablet take 1 tablet by mouth once daily  . metoprolol succinate (TOPROL-XL) 50 MG 24 hr tablet Take 75 mg by mouth daily. Patient has 25 mg tablets- takes three daily  . Misc Natural  Products (OSTEO BI-FLEX TRIPLE STRENGTH PO) Take 1 tablet by mouth daily.  . Misc Natural Products (TART CHERRY ADVANCED PO) Take 1 capsule by mouth daily.  . Multiple Vitamins-Minerals (AIRBORNE PO) Take 1 tablet by mouth daily as needed (for vacation).   . Multiple Vitamins-Minerals (CENTRUM SILVER ULTRA WOMENS) TABS Take 1 tablet by mouth every evening.   . Omega-3 Fatty Acids (FISH OIL PO) Take 2 capsules by mouth every evening.   . rivaroxaban (XARELTO) 20 MG TABS tablet Take 1 tablet (20 mg total) by mouth daily with supper. (Patient taking differently: Take 20 mg by mouth at bedtime. )  . sodium chloride (OCEAN) 0.65 % nasal spray Place 2 sprays into the nose daily. After shower  . trolamine salicylate (ASPERCREME) 10 % cream Apply 1 application topically daily.  . TURMERIC PO Take 1 capsule by mouth daily.  Marland Kitchen albuterol (PROAIR HFA) 108 (90 Base) MCG/ACT inhaler INHALE 2 PUFFS INTO THE LUNGS EVERY 4 HOURS IF NEEDED FOR WHEEZING OR SHORTNESS OF BREATH(COUGH SHORTNESS OF BREATH AND WHEEZING) (Patient taking differently: Inhale 2 puffs into the lungs every 4 (four) hours as needed for wheezing or shortness of breath. )  . amiodarone (PACERONE) 200 MG tablet Take 1 tablet (200 mg total) by mouth daily. (Patient not taking: Reported on 08/29/2017)  FAMILY HISTORY:  Her family history includes Diabetes in her sister; Heart disease in her father and mother; Hypertension in her brother. There is no history of Colon cancer, Pancreatic cancer, Rectal cancer, or Stomach cancer.  SOCIAL HISTORY: She  reports that she has never smoked. She has never used smokeless tobacco. She reports that she drinks alcohol. She reports that she does not use drugs.  REVIEW OF SYSTEMS:  POSITIVES IN BOLD  Gen: Denies fever, chills, weight change, fatigue, night sweats HEENT: Denies vision changes, sinus congestion, sore throat PULM: Denies shortness of breath, dry cough, sputum production, wheezing CV: Denies  chest pain, edema, orthopnea, paroxysmal nocturnal dyspnea, palpitations GI: Denies abdominal pain, nausea, vomiting, diarrhea, change in bowel habits GU: Denies dysuria, hematuria, polyuria, oliguria, urethral discharge Endocrine: Denies hot or cold intolerance, polyuria, polyphagia or appetite change Derm: Denies rash, dry skin, or peeling skin change Heme: Denies easy bruising, bleeding Neuro: Denies headache, numbness, generalized weakness, slurred speech, loss of memory or consciousness  SUBJECTIVE:  No current complaints.  States she feels better now than she did before. Getting additional NS 500 bolus, but stopped after BNP elevated, received total 400 ml On neo at 60 mcg/min.  VITAL SIGNS: BP 94/69   Pulse 70   Temp 97.6 F (36.4 C) (Axillary)   Resp 18   Ht 5' 2"  (1.575 m)   Wt 78.1 kg   LMP  (LMP Unknown)   SpO2 99%   BMI 31.50 kg/m   HEMODYNAMICS:    VENTILATOR SETTINGS:    INTAKE / OUTPUT: I/O last 3 completed shifts: In: 992.6 [P.O.:377; IV Piggyback:615.6] Out: 0   PHYSICAL EXAMINATION: General:  Well nourished elderly female lying in bed in NAD HEENT: MM pink/dry, pupils 4/ reactive, anicteric, no JVD Neuro: Awake, oriented x 3, MAE, non focal  CV: SR, rr, no m/r/g PULM: even/non-labored, normal rr, lungs bilaterally clear anteriorly, bibasilar rales posterior R>L GI: soft, non-tender, bs active  Extremities: cool/dry, trace pedal edema  Skin: raised red rash, non pruritic, blanching to lower abd, upper thighs and arms- not in folds.  See picture below.  Additional area of petechiae noted her feet, R > L  R foot      LABS:  BMET Recent Labs  Lab 08/29/17 1145 08/29/17 2210  NA 142 136  K 3.6 3.4*  CL 106 106  CO2 24 21*  BUN 27* 28*  CREATININE 1.57* 1.91*  GLUCOSE 110* 155*    Electrolytes Recent Labs  Lab 08/29/17 1145 08/29/17 2210  CALCIUM 8.4* 7.4*    CBC Recent Labs  Lab 08/29/17 1145 08/29/17 2210  WBC 8.2 10.4    HGB 14.6 12.3  HCT 46.8* 39.2  PLT 144* 121*    Coag's Recent Labs  Lab 08/29/17 1145  INR 1.65    Sepsis Markers Recent Labs  Lab 08/29/17 1436 08/29/17 1458 08/29/17 2210  LATICACIDVEN 0.66  --  1.2  PROCALCITON  --  0.21  --     ABG No results for input(s): PHART, PCO2ART, PO2ART in the last 168 hours.  Liver Enzymes Recent Labs  Lab 08/29/17 2210  AST 20  ALT 26  ALKPHOS 53  BILITOT 1.0  ALBUMIN 2.6*    Cardiac Enzymes Recent Labs  Lab 08/29/17 1458 08/29/17 2217  TROPONINI <0.03 <0.03    Glucose Recent Labs  Lab 08/29/17 2330  GLUCAP 124*    Imaging Dg Chest 2 View  Result Date: 08/29/2017 CLINICAL DATA:  Chest pain and shortness  of breath atrial fibrillation. EXAM: CHEST - 2 VIEW COMPARISON:  08/04/2017 and 08/01/2017 and 02/19/2016 FINDINGS: Chronic cardiomegaly.  Aortic atherosclerosis. Pulmonary vascularity is normal. No infiltrates or effusions. Slight chronic accentuation of the thoracic kyphosis. IMPRESSION: Chronic cardiomegaly.  No acute abnormalities. Aortic Atherosclerosis (ICD10-I70.0). Electronically Signed   By: Lorriane Shire M.D.   On: 08/29/2017 12:08   STUDIES:   CULTURES: 8/13 RVP >> neg 8/14 MRSA PCR >> neg 8/14 BCx 2>>  ANTIBIOTICS: 8/13 ceftriaxone >> 8/14 doxycycline >>  SIGNIFICANT EVENTS: 8/13 Admit  LINES/TUBES: PIV x 2 8/13 Foley   DISCUSSION: 68 yof presenting with one day episode of fatigue, dry cough with SOB, chest pain, and non-puritic rash that started a week ago.  Initially tmax 100.6, CXR negative, but noted to have bibasilar crackles, normal WBC, PCT, and lactic but with ongoing hypotension throughout the evening and increasing AKI.  Troponin neg, BNP elevated.  Had normal EF on echo 08/10/17.  She did receive her home lopressor and amiodarone yesterday.  Now has hypotension requiring neosynephrine for BP support.   ASSESSMENT / PLAN:  Hypotension - unclear etiology- septic (although normal WBC  and PCT) vs cardiogenic; less likely a reaction with normal eosinophils.  More pronounced after getting her lopressor and amiodarone Hx of HTN, hld - 08/10/17 TTE >- Normal LV systolic function; mild LVH; trace AI; mildly dilated   aortic root; mild MR; biatrial enlargement; mild to moderate TR. P:  ICU monitoring Continue neo for MAP goal > 65 Send cortisol  Hold on further IVF  Hold on further abx, monitor clinically  Continue statin   Shortness of breath Cough- dry - CXR neg- no frank edema or effusions, PCT reassuring  - no significant hypoxia - RVP negative  P:  Supplemental O2 prn CXR in am  Could be related heart failure given elevated BNP, but will hold on diuresis as she remains on pressors Assess sed rate    Chest pain  - neg troponin x 2, non acute EKG - positional, resolved with tyelnol P:  Trend troponin  AKI (baseline 1.04-1.17) - normal lactic - s/p ~1.5 L - rising from admit sCr P:  Hold further IVF  S/p foley placement Send for UA Trend urinary output/ renal panel, daily wt/ Io's  Thrombocytopenia - previously 223 in July, now 121 - no anemia P: Trend CBC On Xarelto   Afib on Xarelto - currently rate controlled P:  Tele monitoring  Hold further lopressor / amio with hypotension   Probable protein calorie malnutrition with low albumin and protein P:  Maximize nutrition as able     FAMILY  - Updates: She states she lives alone and independently.  Her son, Waunita Schooner lives about 6 hours away.  States her neighbor is her emergency contact, Rosanna Aims.   - Inter-disciplinary family meet or Palliative Care meeting due by:  8/20  DVT prophylaxis: xarelto SUP: not indicated  Diet: thin liquids Activity: bedrest Disposition : ICU   Kennieth Rad, AGACNP-BC [place] Pgr: [phone] or if no answer [phone] 08/30/2017, 12:45 AM

## 2017-08-29 NOTE — Progress Notes (Signed)
Pt's BP value was low, the on-call provider was notified at [place]. Order was received for NS of 500 ml bolus. After the bolus was given, the BP was getting lower. Rapid Response RN was notified. Pt is alert and oriented. Tylenol and Tessalon Perles  Were given for the C/O chest pain that comes with coughing. After the on-call provider consulted with the cardiologist, who came to the bedside with the on-call provider, and rapid response RN, the patient was started on a Vasopressor, and currently has been transferred to 63M 14

## 2017-08-30 ENCOUNTER — Observation Stay (HOSPITAL_COMMUNITY): Payer: Medicare Other

## 2017-08-30 DIAGNOSIS — Z7901 Long term (current) use of anticoagulants: Secondary | ICD-10-CM | POA: Diagnosis not present

## 2017-08-30 DIAGNOSIS — J9601 Acute respiratory failure with hypoxia: Secondary | ICD-10-CM | POA: Diagnosis not present

## 2017-08-30 DIAGNOSIS — E785 Hyperlipidemia, unspecified: Secondary | ICD-10-CM | POA: Diagnosis present

## 2017-08-30 DIAGNOSIS — E46 Unspecified protein-calorie malnutrition: Secondary | ICD-10-CM | POA: Diagnosis present

## 2017-08-30 DIAGNOSIS — N179 Acute kidney failure, unspecified: Secondary | ICD-10-CM | POA: Diagnosis present

## 2017-08-30 DIAGNOSIS — D696 Thrombocytopenia, unspecified: Secondary | ICD-10-CM | POA: Diagnosis present

## 2017-08-30 DIAGNOSIS — I952 Hypotension due to drugs: Secondary | ICD-10-CM

## 2017-08-30 DIAGNOSIS — I4892 Unspecified atrial flutter: Secondary | ICD-10-CM | POA: Diagnosis present

## 2017-08-30 DIAGNOSIS — J180 Bronchopneumonia, unspecified organism: Secondary | ICD-10-CM | POA: Diagnosis present

## 2017-08-30 DIAGNOSIS — A419 Sepsis, unspecified organism: Secondary | ICD-10-CM | POA: Diagnosis present

## 2017-08-30 DIAGNOSIS — I313 Pericardial effusion (noninflammatory): Secondary | ICD-10-CM | POA: Diagnosis not present

## 2017-08-30 DIAGNOSIS — R21 Rash and other nonspecific skin eruption: Secondary | ICD-10-CM | POA: Diagnosis present

## 2017-08-30 DIAGNOSIS — J9811 Atelectasis: Secondary | ICD-10-CM | POA: Diagnosis present

## 2017-08-30 DIAGNOSIS — R6521 Severe sepsis with septic shock: Secondary | ICD-10-CM | POA: Diagnosis present

## 2017-08-30 DIAGNOSIS — J9621 Acute and chronic respiratory failure with hypoxia: Secondary | ICD-10-CM | POA: Diagnosis not present

## 2017-08-30 DIAGNOSIS — K7460 Unspecified cirrhosis of liver: Secondary | ICD-10-CM | POA: Diagnosis present

## 2017-08-30 DIAGNOSIS — J918 Pleural effusion in other conditions classified elsewhere: Secondary | ICD-10-CM | POA: Diagnosis not present

## 2017-08-30 DIAGNOSIS — Z9889 Other specified postprocedural states: Secondary | ICD-10-CM | POA: Diagnosis not present

## 2017-08-30 DIAGNOSIS — R531 Weakness: Secondary | ICD-10-CM | POA: Diagnosis present

## 2017-08-30 DIAGNOSIS — I481 Persistent atrial fibrillation: Secondary | ICD-10-CM | POA: Diagnosis present

## 2017-08-30 DIAGNOSIS — K7581 Nonalcoholic steatohepatitis (NASH): Secondary | ICD-10-CM | POA: Diagnosis present

## 2017-08-30 DIAGNOSIS — I4891 Unspecified atrial fibrillation: Secondary | ICD-10-CM | POA: Diagnosis not present

## 2017-08-30 DIAGNOSIS — J9600 Acute respiratory failure, unspecified whether with hypoxia or hypercapnia: Secondary | ICD-10-CM | POA: Diagnosis not present

## 2017-08-30 DIAGNOSIS — I10 Essential (primary) hypertension: Secondary | ICD-10-CM | POA: Diagnosis not present

## 2017-08-30 DIAGNOSIS — J189 Pneumonia, unspecified organism: Secondary | ICD-10-CM | POA: Diagnosis not present

## 2017-08-30 DIAGNOSIS — I130 Hypertensive heart and chronic kidney disease with heart failure and stage 1 through stage 4 chronic kidney disease, or unspecified chronic kidney disease: Secondary | ICD-10-CM | POA: Diagnosis present

## 2017-08-30 DIAGNOSIS — E669 Obesity, unspecified: Secondary | ICD-10-CM | POA: Diagnosis present

## 2017-08-30 DIAGNOSIS — N189 Chronic kidney disease, unspecified: Secondary | ICD-10-CM | POA: Diagnosis not present

## 2017-08-30 DIAGNOSIS — I959 Hypotension, unspecified: Secondary | ICD-10-CM | POA: Diagnosis not present

## 2017-08-30 DIAGNOSIS — D631 Anemia in chronic kidney disease: Secondary | ICD-10-CM | POA: Diagnosis present

## 2017-08-30 DIAGNOSIS — I214 Non-ST elevation (NSTEMI) myocardial infarction: Secondary | ICD-10-CM | POA: Diagnosis not present

## 2017-08-30 DIAGNOSIS — J90 Pleural effusion, not elsewhere classified: Secondary | ICD-10-CM | POA: Diagnosis not present

## 2017-08-30 DIAGNOSIS — N182 Chronic kidney disease, stage 2 (mild): Secondary | ICD-10-CM | POA: Diagnosis present

## 2017-08-30 DIAGNOSIS — E782 Mixed hyperlipidemia: Secondary | ICD-10-CM | POA: Diagnosis not present

## 2017-08-30 DIAGNOSIS — K754 Autoimmune hepatitis: Secondary | ICD-10-CM | POA: Diagnosis present

## 2017-08-30 DIAGNOSIS — I5033 Acute on chronic diastolic (congestive) heart failure: Secondary | ICD-10-CM | POA: Diagnosis not present

## 2017-08-30 LAB — RENAL FUNCTION PANEL
ALBUMIN: 2.6 g/dL — AB (ref 3.5–5.0)
ANION GAP: 11 (ref 5–15)
BUN: 33 mg/dL — ABNORMAL HIGH (ref 8–23)
CALCIUM: 7.6 mg/dL — AB (ref 8.9–10.3)
CO2: 22 mmol/L (ref 22–32)
CREATININE: 2.24 mg/dL — AB (ref 0.44–1.00)
Chloride: 106 mmol/L (ref 98–111)
GFR calc Af Amer: 23 mL/min — ABNORMAL LOW (ref >60)
GFR calc non Af Amer: 20 mL/min — ABNORMAL LOW (ref >60)
Glucose, Bld: 130 mg/dL — ABNORMAL HIGH (ref 70–99)
PHOSPHORUS: 3.1 mg/dL (ref 2.5–4.6)
Potassium: 3.8 mmol/L (ref 3.5–5.1)
SODIUM: 139 mmol/L (ref 135–145)

## 2017-08-30 LAB — PROCALCITONIN: PROCALCITONIN: 1.46 ng/mL

## 2017-08-30 LAB — BASIC METABOLIC PANEL
Anion gap: 11 (ref 5–15)
BUN: 33 mg/dL — AB (ref 8–23)
CALCIUM: 7.9 mg/dL — AB (ref 8.9–10.3)
CO2: 22 mmol/L (ref 22–32)
CREATININE: 2.07 mg/dL — AB (ref 0.44–1.00)
Chloride: 103 mmol/L (ref 98–111)
GFR calc Af Amer: 25 mL/min — ABNORMAL LOW (ref >60)
GFR, EST NON AFRICAN AMERICAN: 21 mL/min — AB (ref >60)
GLUCOSE: 136 mg/dL — AB (ref 70–99)
Potassium: 3.6 mmol/L (ref 3.5–5.1)
SODIUM: 136 mmol/L (ref 135–145)

## 2017-08-30 LAB — CBC
HCT: 39.6 % (ref 36.0–46.0)
HEMOGLOBIN: 12.2 g/dL (ref 12.0–15.0)
MCH: 28.6 pg (ref 26.0–34.0)
MCHC: 30.8 g/dL (ref 30.0–36.0)
MCV: 93 fL (ref 78.0–100.0)
Platelets: 131 10*3/uL — ABNORMAL LOW (ref 150–400)
RBC: 4.26 MIL/uL (ref 3.87–5.11)
RDW: 15.1 % (ref 11.5–15.5)
WBC: 10.1 10*3/uL (ref 4.0–10.5)

## 2017-08-30 LAB — TROPONIN I
Troponin I: 0.04 ng/mL (ref <0.03)
Troponin I: 0.04 ng/mL (ref <0.03)

## 2017-08-30 LAB — LACTIC ACID, PLASMA: LACTIC ACID, VENOUS: 1.7 mmol/L (ref 0.5–1.9)

## 2017-08-30 LAB — CORTISOL: CORTISOL PLASMA: 21.3 ug/dL

## 2017-08-30 LAB — STREP PNEUMONIAE URINARY ANTIGEN: STREP PNEUMO URINARY ANTIGEN: NEGATIVE

## 2017-08-30 LAB — MRSA PCR SCREENING: MRSA BY PCR: NEGATIVE

## 2017-08-30 LAB — MAGNESIUM: Magnesium: 2 mg/dL (ref 1.7–2.4)

## 2017-08-30 LAB — SEDIMENTATION RATE: Sed Rate: 12 mm/hr (ref 0–22)

## 2017-08-30 MED ORDER — AMIODARONE HCL 200 MG PO TABS
200.0000 mg | ORAL_TABLET | Freq: Two times a day (BID) | ORAL | Status: DC
  Administered 2017-08-30 – 2017-09-11 (×24): 200 mg via ORAL
  Filled 2017-08-30 (×25): qty 1

## 2017-08-30 MED ORDER — LEVOFLOXACIN IN D5W 750 MG/150ML IV SOLN: 750.0000 mg | INTRAVENOUS | Status: DC

## 2017-08-30 MED ORDER — CEFTRIAXONE SODIUM 1 G IJ SOLR
1.0000 g | Freq: Every day | INTRAMUSCULAR | Status: DC
  Administered 2017-08-30 – 2017-09-07 (×9): 1 g via INTRAVENOUS
  Filled 2017-08-30 (×9): qty 10

## 2017-08-30 MED ORDER — ORAL CARE MOUTH RINSE
15.0000 mL | Freq: Two times a day (BID) | OROMUCOSAL | Status: DC
  Administered 2017-08-31 – 2017-09-10 (×16): 15 mL via OROMUCOSAL

## 2017-08-30 MED ORDER — LACTATED RINGERS IV BOLUS
500.0000 mL | Freq: Once | INTRAVENOUS | Status: AC
  Administered 2017-08-30: 500 mL via INTRAVENOUS

## 2017-08-30 NOTE — Assessment & Plan Note (Signed)
Given fever and tachy (SIRS ) +  LLL density will have to consider this as sepsis syndrome despite normal WBC . PCT slightly high and can be c/w localized bacterial infection  Plan - restart ceftriaxone - recheck PCT - no vanc or levaquin - dc these orders - check urine strep and legionella

## 2017-08-30 NOTE — Progress Notes (Addendum)
PULMONARY / CRITICAL CARE MEDICINE   Name: Brenda Matthews MRN: [id number] DOB: Apr 06, 1935    ADMISSION DATE:  08/29/2017 CONSULTATION DATE:  08/29/2017  REFERRING MD:  Dr. Lorin Mercy  CHIEF COMPLAINT:  Hypotension  brief 82 year old female with PMH significant for but not limited to never smoker, Afib on Xarelto and amiodarone, HTN, HLD, autoimmune hepattis, CKD, NASH, hypothyroidism who presented to hospital after not feeling well, with onset on of chills today, nonproductive coughing episodes with shortness of breath chest chest wall pain.  Chest wall pain would change with position and reproducible.    States she was in her normal state of health prior to today but recalls having an allergic reaction last week.  Broke out "hives" on her arms, back, abdomen, and legs, however wasn't pruritic, painful, or fluid filled.  She did not seek treatment for this and said her rash has been slow to resolve.  Denies any recent tick exposure, skin allergies, or tongue/ facial swelling. She does states she's had this similar rash before about 6 years ago in which she was seen in ER (08/16/2012), unclear what it was, but was treated with doxycycline and it cleared.   Reports rash in covered areas and using new detergent x few weeks  She reports similar episode of dry coughing and shortness of breath episode around a month ago, exertional that improved with rest and some orthopnea and episodes of gasping at night.  She was seen and treated with doxycyline in which she completed.  Additionally, they increased her lasix after her CXR showed mild HF and small pleural effusions and she felt much better.  On chart review, her weights in July 178, 176, to now today at 172.  She had an TTE on 08/10/17 which showed normal systolic function.   On admit she was found to have temp of 100.6, mild tachypnea, SR HR, and intermittent soft blood pressures which have improved after three NS 500 ml boluses.  On chart review, she did  receive lopressor 51m at 1238 and amiodarone 200 mg, both with subsequent bouts of hypotension. Labs noted for sCr 1.57 -> 1.91, negative troponin x 2 thus far, no changes on EKG, BNP noted at 1028, Lactic 0.66 -> 1.2, negative RVP, PCT 0.21, normal coags, normal WBC, no elevated eosinophils Platelets 121.  She did have bibasilar crackles but no significant hypoxia.  CXR noted no acute abnormalities or effusions.  Empirically given ceftriaxone and doxycycline.  [facility] mild AMS which resolved with improvement in blood pressure. Additionally she complained of 5/10 chest pressure that resolved with tylenol.  Given additional fluid bolus (total of 1.5L), a foley was placed, and required neosynephrine support and transferred to ICU.  Therefore, PCCM called for further medical management.    CULTURES: 8/13 RVP >> neg 8/14 MRSA PCR >> neg 8/14 BCx 2>>  ANTIBIOTICS: 8/13 ceftriaxone >> 8/14 doxycycline >> 8/13   SIGNIFICANT EVENTS: 8/13 Admit  LINES/TUBES: PIV x 2 8/13 Foley     Events 8/13 - No current complaints.  States she feels better now than she did before. Getting additional NS 500 bolus, but stopped after BNP elevated, received total 400 ml On neo at 60 mcg/min.    SUBJECTIVE/OVERNIGHT/INTERVAL HX 8/14 - fever improved/esolved. WBC normal. PCT 0.21 at admit. . Pressor need resolving. Using 2L [place] for comfort but on RA she is 93. CXR with worsening LLL consolidation/collapse. AKI worse. Sitting, talking, Overall feeling better. Rash  continues in covered parts. Having cough.   VITAL SIGNS: BP (!) 89/49   Pulse 72   Temp 98.1 F (36.7 C) (Oral)   Resp (!) 28   Ht 5' 2"  (1.575 m)   Wt 77.4 kg   LMP  (LMP Unknown)   SpO2 97%   BMI 31.21 kg/m   HEMODYNAMICS:    VENTILATOR SETTINGS:    INTAKE / OUTPUT: I/O last 3 completed shifts: In: 1987.8 [P.O.:377; I.V.:564; IV Piggyback:1046.8] Out: 475 [Urine:475]  PHYSICAL  EXAMINATION:  General Appearance:    Looks well. Sitting and chatting. Ate breakfast  Head:    Normocephalic, without obvious abnormality, atraumatic  Eyes:    PERRL - yes, conjunctiva/corneas - clear      Ears:    Normal external ear canals, both ears  Nose:   NG tube - no but has [place]  Throat:  ETT TUBE - no , OG tube - no  Neck:   Supple,  No enlargement/tenderness/nodules     Lungs:     Clear to auscultation bilaterally but in bases h as crackles an dLeft Base air entry is diinished  Chest wall:    No deformity  Heart:    S1 and S2 normal, no murmur, CVP - no.  Pressors - no  Abdomen:     Soft, no masses, no organomegaly  Genitalia:    Not done  Rectal:   not done  Extremities:   Extremities- intact     Skin:   Intact in exposed areas . Rash of 08/29/17 in pictures below is same     Neurologic:   Sedation - none -> RASS - +1 . Moves all 4s - yes. CAM-ICU - neg . Orientation - x3+    R foot 8/13-       LABS: PULMONARY No results for input(s): PHART, PCO2ART, PO2ART, HCO3, TCO2, O2SAT in the last 168 hours.  Invalid input(s): PCO2, PO2  CBC Recent Labs  Lab 08/29/17 1145 08/29/17 2210 08/30/17 0521  HGB 14.6 12.3 12.2  HCT 46.8* 39.2 39.6  WBC 8.2 10.4 10.1  PLT 144* 121* 131*    COAGULATION Recent Labs  Lab 08/29/17 1145  INR 1.65    CARDIAC   Recent Labs  Lab 08/29/17 1458 08/29/17 2217 08/30/17 0521  TROPONINI <0.03 <0.03 0.04*   No results for input(s): PROBNP in the last 168 hours.   CHEMISTRY Recent Labs  Lab 08/29/17 1145 08/29/17 2210 08/30/17 0034 08/30/17 0521  NA 142 136  --  139  K 3.6 3.4*  --  3.8  CL 106 106  --  106  CO2 24 21*  --  22  GLUCOSE 110* 155*  --  130*  BUN 27* 28*  --  33*  CREATININE 1.57* 1.91*  --  2.24*  CALCIUM 8.4* 7.4*  --  7.6*  MG  --   --  2.0  --   PHOS  --   --   --  3.1   Estimated Creatinine Clearance: 19.3 mL/min (A) (by C-G formula based on SCr of 2.24 mg/dL (H)).   LIVER Recent Labs    Lab 08/29/17 1145 08/29/17 2210 08/30/17 0521  AST  --  20  --   ALT  --  26  --   ALKPHOS  --  53  --   BILITOT  --  1.0  --   PROT  --  5.3*  --   ALBUMIN  --  2.6* 2.6*  INR  1.65  --   --      INFECTIOUS Recent Labs  Lab 08/29/17 1436 08/29/17 1458 08/29/17 2210 08/30/17 0034  LATICACIDVEN 0.66  --  1.2 1.7  PROCALCITON  --  0.21  --   --      ENDOCRINE CBG (last 3)  Recent Labs    08/29/17 2330  GLUCAP 124*         IMAGING x48h  - image(s) personally visualized  -   highlighted in bold Dg Chest 2 View  Result Date: 08/29/2017 CLINICAL DATA:  Chest pain and shortness of breath atrial fibrillation. EXAM: CHEST - 2 VIEW COMPARISON:  08/04/2017 and 08/01/2017 and 02/19/2016 FINDINGS: Chronic cardiomegaly.  Aortic atherosclerosis. Pulmonary vascularity is normal. No infiltrates or effusions. Slight chronic accentuation of the thoracic kyphosis. IMPRESSION: Chronic cardiomegaly.  No acute abnormalities. Aortic Atherosclerosis (ICD10-I70.0). Electronically Signed   By: Lorriane Shire M.D.   On: 08/29/2017 12:08   Dg Chest Port 1 View  Result Date: 08/30/2017 CLINICAL DATA:  Shortness of breath beginning today. EXAM: PORTABLE CHEST 1 VIEW COMPARISON:  08/29/2017.  08/04/2017. FINDINGS: Chronic cardiomegaly and aortic atherosclerosis. Mild chronic volume loss at the right lung base. Worsened appearance of the left lung base with increased volume loss and/or pneumonia. Probable associated effusion. Upper lungs are clear. IMPRESSION: Chronic cardiomegaly and aortic atherosclerosis. Worsening of abnormal density at the left lung base consistent with atelectasis and or pneumonia, probably with an associated effusion. Mild chronic volume loss at the right base. Electronically Signed   By: Nelson Chimes M.D.   On: 08/30/2017 08:31   DISCUSSION: 77 yof presenting with one day episode of fatigue, dry cough with SOB, chest pain, and non-puritic rash that started a week ago.   Initially tmax 100.6, CXR negative, but noted to have bibasilar crackles, normal WBC, PCT, and lactic but with ongoing hypotension throughout the evening and increasing AKI.  Troponin neg, BNP elevated.  Had normal EF on echo 08/10/17.  She did receive her home lopressor and amiodarone yesterday.  Now has hypotension requiring neosynephrine for BP support.   ASSESSMENT / PLAN:   Sepsis ([place]) Given fever and tachy (SIRS ) +  LLL density will have to consider this as sepsis syndrome despite normal WBC . PCT slightly high and can be c/w localized bacterial infection  Plan - restart ceftriaxone - recheck PCT - no vanc or levaquin - dc these orders - check urine strep and legionella  Bronchopneumonia LLL  Plan Per sepsis section Incentive spirometry q1h  Arterial hypotension Resolved after fluid and neo  Plan Dc neo Map goal > 65  Acute-on-chronic kidney injury ([place]) Recent Labs  Lab 08/29/17 1145 08/29/17 2210 08/30/17 0521  CREATININE 1.57* 1.91* 2.24*     Baseline creat 1.2-1.57 as of July 2019 Currently getting worse and low UOP reported  Plan 500cc LR bolus and recheck bmet at 7pm    Rash in adult New rash x 1 week  foloowing new detrgent  Plan Skin bx - ccs called - given fact patient on xarelot wants to hold off and judget base on clinical course Check autoimmune and vasculitis profile  PAF (paroxysmal atrial fibrillation) ([place]) Per triad Monitor trop     CCM will sign off - not confused, not on pressors, not on vent. And doing well with room air; if continues to be stable through 08/31/17 \  Mgmt per triad    Dr. Brand Males, M.D., [facility].C.P Pulmonary and Critical Care Medicine Staff Physician,  [place] Director - Interstitial Lung Disease  Program  Pulmonary [place] at [place], [place], [address]  Pager: [phone], If no answer or between  15:00h - 7:00h: call [phone]   [phone] Telephone: [phone]  \

## 2017-08-30 NOTE — Progress Notes (Signed)
CRITICAL VALUE ALERT  Critical Value:  Troponin 0.04  Date & Time Notied:  08/30/2017 0630  Provider Notified: Warren Lacy  Orders Received/Actions taken: awaiting orders

## 2017-08-30 NOTE — Assessment & Plan Note (Signed)
New rash x 1 week  foloowing new detrgent  Plan Skin bx - [facility] called Check autoimmune and vasculitis profile

## 2017-08-30 NOTE — Progress Notes (Signed)
EKG CRITICAL VALUE     12 lead EKG performed.  Critical value noted.  April Lewis, RN notified.   Judianne Seiple, CCT 08/30/2017 8:09 AM

## 2017-08-30 NOTE — Assessment & Plan Note (Addendum)
LLL  Plan Per sepsis section Incentive spirometry [phone]

## 2017-08-30 NOTE — Assessment & Plan Note (Signed)
Recent Labs  Lab 08/29/17 1145 08/29/17 2210 08/30/17 0521  CREATININE 1.57* 1.91* 2.24*     Baseline creat 1.2-1.57 as of July 2019 Currently getting worse and low UOP reported  Plan 500cc LR bolus and recheck bmet at 7pm

## 2017-08-30 NOTE — Assessment & Plan Note (Signed)
Per [facility] Monitor trop

## 2017-08-30 NOTE — Progress Notes (Addendum)
PROGRESS NOTE    Brenda Matthews  DGL:[id number] DOB: 11/16/1935 DOA: 08/29/2017 PCP: [name], MD    Brief Narrative: Brenda Matthews is a very pleasant 82 y.o. female with medical history significant for paroxysmal A. Fib on anticoagulation, hypertension, hyperlipidemia, chronic renal insufficiency stage II, autoimmune hepatitis presents to the emergency department with the chief complaint of chest pain shortness of breath generalized weakness. Initial evaluation reveals max temp of 100.6, EKG with atrial fibrillation and heart rate of 106, acute kidney injury. [facility] are asked to admit  Information is obtained from the patient and the chart. She states she was in her usual state of health until this morning she awakened with chest discomfort. She describes this discomfort as a "pressure" and is located across the chest just below clavicles on left and right. She says it's worse when she talked and when she touches that area of her chest. Associated symptoms include generalized weakness shortness of breath moist sounding but nonproductive cough. She states over the last couple of weeks her heart rhythm has been going "in and out of A. Fib". She has had her metoprolol adjusted and her Lasix adjusted. She states she had an appointment for cardioversion but her rhythm converted on its own so that was canceled. He denies any feeling of palpitations headache dizziness syncope or near-syncope. She denies nausea vomiting diarrhea constipation melena bright red blood per rectum. She denies dysuria hematuria frequency or urgency. She denies lower extremity edema or arthropathy at.  Patient was transfer to ICU overnight due to hypotension. She received IV bolus, started on neo/   Assessment & Plan:   Principal Problem:   Chest pain Active Problems:   Hyperlipidemia   Essential hypertension   Chronic renal insufficiency, stage II (mild)   PAF (paroxysmal atrial fibrillation) (HCC)  Chronic anticoagulation   DOE (dyspnea on exertion)   Arterial hypotension   1-Hypotension; Diferrential cardiogenic vs sepsis.  Received IV bolus. Was on neo overnight.  Follow Blood cultures.  Pro-calcitonin and lactic acid normal.  CCM was consulted and following.  Chest x ray with PNA, start IV antibiotics.   2-Dyspnea; EKG chahes, mild elevation troponin.  Cardiology consulted.  Currently complaints of dyspnea.  Will get chest xray stat.  Cardiology consulted.  CCM informed.  Will hold lasix due to hypotension.   3-Pneumonia;  Started on IV ceftriaxone.  Sputum culture,.   -Rash; Dr Brenda Matthews consulted surgery. Vasculitis panel.   4-Paroxysmal A fib;  Continue with amiodarone and xarelto.   5-HTN; hold lasix, metoprolol, losartan.   6-Hyperlipidemia; continue with statins.   7-Acute on chronic renal failure stage II; Avoid hypotension. Now on pressors which hopefully will help with renal perfusion.  Holding IV fluids due to concern for pulmonary edema/  Bladder scan.  Strict I and O.  Will order renal US.   8-History of NASH;     DVT prophylaxis:on xarelto.  Code Status: Full code.  Family Communication: care discussed with patient.  Disposition Plan: remain in the ICU   Consultants:   Cardiology  CCM   Procedures:   Antimicrobials: Ceftriaxone 8-14  Subjective: She is not feeling well, report SOB. Report cough. Mild chest pain , pressure.  She relates her neighbors notice she was SOB while talking over phone.  Report she is feeling less weak.     Objective: Vitals:   08/30/17 0400 08/30/17 0441 08/30/17 0500 08/30/17 0600  BP: (!) 93/59  (!) 93/56 (!) 89/60  Pulse: 66  67 70  Resp: (!) 25  (!) 27 (!) 29  Temp:  97.9 F (36.6 C)    TempSrc:  Oral    SpO2: 97%  99% 97%  Weight:      Height:        Intake/Output Summary (Last 24 hours) at 08/30/2017 0759 Last data filed at 08/30/2017 0600 Gross per 24 hour  Intake 1981.6 ml    Output 475 ml  Net 1506.6 ml   Filed Weights   08/29/17 1132 08/29/17 1600 08/29/17 2330  Weight: 75.8 kg 78.1 kg 77.4 kg    Examination:  General exam: Appears calm and comfortable  Respiratory system: no increase work of breathing, bilateral crackles.  Cardiovascular system: S1 & S2 heard, RRR. No JVD, murmurs, rubs, gallops or clicks. No pedal edema. Gastrointestinal system: Abdomen is nondistended, soft and nontender. No organomegaly or masses felt. Normal bowel sounds heard. Central nervous system: Alert and oriented. No focal neurological deficits. Extremities: Symmetric 5 x 5 power. Skin: No rashes, lesions or ulcers   Data Reviewed: I have personally reviewed following labs and imaging studies  CBC: Recent Labs  Lab 08/29/17 1145 08/29/17 2210 08/30/17 0521  WBC 8.2 10.4 10.1  NEUTROABS  --  8.6*  --   HGB 14.6 12.3 12.2  HCT 46.8* 39.2 39.6  MCV 92.9 91.8 93.0  PLT 144* 121* 127*   Basic Metabolic Panel: Recent Labs  Lab 08/29/17 1145 08/29/17 2210 08/30/17 0034 08/30/17 0521  NA 142 136  --  139  K 3.6 3.4*  --  3.8  CL 106 106  --  106  CO2 24 21*  --  22  GLUCOSE 110* 155*  --  130*  BUN 27* 28*  --  33*  CREATININE 1.57* 1.91*  --  2.24*  CALCIUM 8.4* 7.4*  --  7.6*  MG  --   --  2.0  --   PHOS  --   --   --  3.1   GFR: Estimated Creatinine Clearance: 19.3 mL/min (A) (by C-G formula based on SCr of 2.24 mg/dL (H)). Liver Function Tests: Recent Labs  Lab 08/29/17 2210 08/30/17 0521  AST 20  --   ALT 26  --   ALKPHOS 53  --   BILITOT 1.0  --   PROT 5.3*  --   ALBUMIN 2.6* 2.6*   No results for input(s): LIPASE, AMYLASE in the last 168 hours. No results for input(s): AMMONIA in the last 168 hours. Coagulation Profile: Recent Labs  Lab 08/29/17 1145  INR 1.65   Cardiac Enzymes: Recent Labs  Lab 08/29/17 1458 08/29/17 2217 08/30/17 0521  TROPONINI <0.03 <0.03 0.04*   BNP (last 3 results) Recent Labs    08/01/17 1017  08/04/17 1447  PROBNP 631.0* 574.0*   HbA1C: No results for input(s): HGBA1C in the last 72 hours. CBG: Recent Labs  Lab 08/29/17 2330  GLUCAP 124*   Lipid Profile: No results for input(s): CHOL, HDL, LDLCALC, TRIG, CHOLHDL, LDLDIRECT in the last 72 hours. Thyroid Function Tests: No results for input(s): TSH, T4TOTAL, FREET4, T3FREE, THYROIDAB in the last 72 hours. Anemia Panel: No results for input(s): VITAMINB12, FOLATE, FERRITIN, TIBC, IRON, RETICCTPCT in the last 72 hours. Sepsis Labs: Recent Labs  Lab 08/29/17 1436 08/29/17 1458 08/29/17 2210 08/30/17 0034  PROCALCITON  --  0.21  --   --   LATICACIDVEN 0.66  --  1.2 1.7    Recent Results (from the past 240 hour(s))  Respiratory  Panel by PCR     Status: None   Collection Time: 08/29/17  4:30 PM  Result Value Ref Range Status   Adenovirus NOT DETECTED NOT DETECTED Final   Coronavirus 229E NOT DETECTED NOT DETECTED Final   Coronavirus HKU1 NOT DETECTED NOT DETECTED Final   Coronavirus NL63 NOT DETECTED NOT DETECTED Final   Coronavirus OC43 NOT DETECTED NOT DETECTED Final   Metapneumovirus NOT DETECTED NOT DETECTED Final   Rhinovirus / Enterovirus NOT DETECTED NOT DETECTED Final   Influenza A NOT DETECTED NOT DETECTED Final   Influenza B NOT DETECTED NOT DETECTED Final   Parainfluenza Virus 1 NOT DETECTED NOT DETECTED Final   Parainfluenza Virus 2 NOT DETECTED NOT DETECTED Final   Parainfluenza Virus 3 NOT DETECTED NOT DETECTED Final   Parainfluenza Virus 4 NOT DETECTED NOT DETECTED Final   Respiratory Syncytial Virus NOT DETECTED NOT DETECTED Final   Bordetella pertussis NOT DETECTED NOT DETECTED Final   Chlamydophila pneumoniae NOT DETECTED NOT DETECTED Final   Mycoplasma pneumoniae NOT DETECTED NOT DETECTED Final    Comment: Performed at [facility], [place] [address] [place], [place] [address]  MRSA PCR Screening     Status: None   Collection Time: 08/29/17 11:29 PM  Result Value Ref Range Status   MRSA  by PCR NEGATIVE NEGATIVE Final    Comment:        The GeneXpert MRSA Assay (FDA approved for NASAL specimens only), is one component of a comprehensive MRSA colonization surveillance program. It is not intended to diagnose MRSA infection nor to guide or monitor treatment for MRSA infections. Performed at [facility], [place] [address] [place], [place] [address]          Radiology Studies: Dg Chest 2 View  Result Date: 08/29/2017 CLINICAL DATA:  Chest pain and shortness of breath atrial fibrillation. EXAM: CHEST - 2 VIEW COMPARISON:  08/04/2017 and 08/01/2017 and 02/19/2016 FINDINGS: Chronic cardiomegaly.  Aortic atherosclerosis. Pulmonary vascularity is normal. No infiltrates or effusions. Slight chronic accentuation of the thoracic kyphosis. IMPRESSION: Chronic cardiomegaly.  No acute abnormalities. Aortic Atherosclerosis (ICD10-I70.0). Electronically Signed   By: Lorriane Shire M.D.   On: 08/29/2017 12:08        Scheduled Meds: . amiodarone  200 mg Oral Daily  . atorvastatin  40 mg Oral q1800  . fluticasone  2 spray Each Nare Daily  . ipratropium-albuterol  3 mL Nebulization BID  . [START ON 08/31/2017] mouth rinse  15 mL Mouth Rinse BID  . rivaroxaban  15 mg Oral Q supper   Continuous Infusions: . phenylephrine (NEO-SYNEPHRINE) Adult infusion 10 mcg/min (08/30/17 0600)     LOS: 0 days    Time spent: 35 minutes.     Elmarie Shiley, MD [facility] Pager [phone]  If 7PM-7AM, please contact night-coverage [email or web address] Password [facility] 08/30/2017, 7:59 AM

## 2017-08-30 NOTE — Progress Notes (Signed)
CRITICAL VALUE ALERT  Critical Value: EKG STEMI (Does not look like it)  Date & Time Notied:  08/30/17 0747  Provider Notified: Yes  Orders Received/Actions taken: None at this time

## 2017-08-30 NOTE — Assessment & Plan Note (Signed)
Resolved after fluid and neo  Plan Dc neo Map goal > 65

## 2017-08-30 NOTE — Progress Notes (Signed)
CRITICAL VALUE ALERT  Critical Value:  Troponin 0.04  Date & Time Notied:  08/30/17 1339  Provider Notified: Yes  Orders Received/Actions taken: None at this time

## 2017-08-30 NOTE — Consult Note (Signed)
Cardiology Consultation:   Patient ID: [name]; [id number]; 03-Feb-1935   Admit date: 08/29/2017 Date of Consult: 08/30/2017  Primary Care Provider: [name], MD Primary Cardiologist: [name], MD   Patient Profile:   Brenda Matthews is a 82 y.o. female with a hx that includes PAF on Xarelto, chronic diastolic CHF, diverticulosis, NASH, HTN, HLD, hypothyroidism who is being seen today for the evaluation of chest pain at the request of Dr. Debara Pickett.  History of Present Illness:   Brenda Matthews was recently seen in our office on 08/10/2017 with volume overload, atrial fibrillation with elevated heart rate and she was started on amiodarone with plan for early follow-up and possible cardioversion.  Echocardiogram on 08/10/2017 showed mild LVH with EF 50-55%, severely dilated left atrium. Her lasix was increased to BID.  PA peak pressure 32 mmHg.  She was seen back on 08/15/2017 with improved dyspnea on exertion but still mildly present.  She continued and persistent atrial fibrillation on amiodarone loading.  Her TSH was normal.  Her blood pressure was limiting up titration of her beta-blocker.  She was scheduled for an elective cardioversion on 08/17/2017 however this was canceled because she was in sinus bradycardia.  Brenda Matthews presented to the hospital on 08/29/2017 due to not feeling well with onset of chills, nonproductive cough, shortness of breath and chest wall pain. She reportedly had an allergic reaction last week having broken out in hives on her arms, back, abdomen and legs.  This was not pruritic or painful.  She did not seek treatment in the rash has been slow to resolve.  She apparently had no recent tick exposure, skin allergies or tongue/facial swelling.  She reportedly had a similar rash about 6 years ago with unclear etiology, but was treated with doxycycline and cleared. She now tells me that the rash was in the areas covered by clothes and she recently started using a new laundry  detergent.   She is very pleasant and alert currently. She states that yesterday she was just not feeling well. She had no specific symptoms, although she felt like she was getting fluid in her lungs again. She had mild chest tightness and DOE. No palpitations, lightheadedness or orthopnea. She weighs at home and has lost weight since being on increased lasix and watching her diet. Currently she has no chest discomfort but still has mild dyspnea with talking.   She had been taking all of her ordered meds at home including lasix 40 mg bid, losartan, Toprol-XL 75 mg, amiodarone 400 mg bid and Xarelto.  She was febrile on admission, 100.6, with mild tachypnea.  She was in sinus rhythm with intermittent soft blood pressures which improved with 3 normal saline boluses.  Primary team suspected low blood pressures related to close dosing of metoprolol and amiodarone.  Troponins were negative x2, BNP was 1028.  Chest x-ray noted no acute abnormalities or effusions.  Was empirically started on antibiotics.  She had subsequent hypotension with mild altered mental status which improved with improvement in her blood pressure.  She was given a total of 1.5 L of IV fluids and required Neo-Synephrine support.  She is currently in the ICU.  Follow up chest x-ray today showed worsening of abnormal density at the left lung base consistent with atelectasis and/or pneumonia, probably with an associated effusion.  Serum creatinine has risen from 1.5 on admission to 2.24 today.  Past Medical History:  Diagnosis Date  . Abnormal EKG approx 2008  Nuclear stress test neg;   . Anxiety    with panic  . Arthritis   . Autoimmune hepatitis ([place])   . Cataract    s/p surgery--lens implants  . Chronic renal insufficiency, stage II (mild) 12/04/2012   GFR 60s  . Cirrhosis ([place])   . Diverticulosis 2009  . Fracture of radial shaft, left, closed 11/16/10   fell down flight of stairs  . History of kidney stones   . Hx of  adenomatous colonic polyps 2002   surveillance colonoscopy 2009, +polypectomy done-tubular adenoma w/out high grade.  05/2013 tubular adenomas--recall 3 yrs  . Hyperlipidemia   . Hypertension   . Low TSH level 02/18/2016   T3 norm, T4 mildly elevated--suspected sick euthyroid syndrome.  Repeat labs 06/2016: normal.  . NASH (nonalcoholic steatohepatitis)   . Osteopenia    DEXA 08/2010; repeat DEXA 02/2015 worse: fosamax started  . PAF (paroxysmal atrial fibrillation) ([place]) 02/2016   when in post-op for ankle surgery; spontaneously converted in hosp, seen by Dr. Debara Pickett in consultation--metoprolol rate control + xarelto recommended.  Metop d/c due to hypot.  Plan to cont xarelto 20 mg qd indef due to CHAD-VASc score of 3.  A-fib w/RVR and CHF 07/2017; pt placed on amiodarone and plan for CV, but pt was in sinus rhythm when she went in for her DC CV, so she was sent home.  . Peripheral edema   . Pneumonia 2015   hx  . Rheumatic fever     Past Surgical History:  Procedure Laterality Date  . APPENDECTOMY  1966   done during surgery for tubal pregnancy  . CATARACT EXTRACTION W/ INTRAOCULAR LENS IMPLANT  2013   bilat  . COLONOSCOPY W/ POLYPECTOMY  05/2013   +diverticulosis; recall 3 yrs (Dr. Henrene Pastor)  . DEXA  02/2015   T score -2.1 in both femoral necks; FRAX 10 yr risk of major osteoporotic fracture was 21%---fosamax started  . EYE SURGERY    . OPEN REDUCTION INTERNAL FIXATION (ORIF) TIBIA/FIBULA FRACTURE Left 02/18/2016   Procedure: OPEN REDUCTION INTERNAL FIXATION (ORIF) Right ankle trimalleolar fracture;  Surgeon: Wylene Simmer, MD;  Location: [facility];  Service: Orthopedics;  Laterality: Left;  requests 13mns  . ORIF RADIAL FRACTURE  11/18/10   left; s/p slip on slippery floor and fell  . TONSILLECTOMY    . TRANSTHORACIC ECHOCARDIOGRAM  02/18/2016; 08/10/17   LVEF of 55-60%, mild AI and mild MR and normal biatrial size.  07/2017--normal LV function, mild enlarge aortic root, mild/mod TR, bilat atrial  enlargement.     Home Medications:  Prior to Admission medications   Medication Sig Start Date End Date Taking? Authorizing Provider  acetaminophen (TYLENOL) 325 MG tablet Take 650 mg by mouth every 6 (six) hours as needed for moderate pain or headache.    Yes [provider]  alendronate (FOSAMAX) 70 MG tablet Take 1 tablet (70 mg total) by mouth every 7 (seven) days. Take with a full glass of water on an empty stomach. Patient taking differently: Take 70 mg by mouth every Monday. Take with a full glass of water on an empty stomach. 03/06/15  Yes McGowen, PAdrian Blackwater MD  ALPRAZolam (Duanne Moron 0.5 MG tablet TAKE 1 TABLET BY MOUTH THREE TIMES DAILY IF NEEDED FOR STRESS Patient taking differently: Take 0.5 mg by mouth 3 (three) times daily as needed for anxiety.  05/08/17  Yes McGowen, PAdrian Blackwater MD  Apoaequorin (PREVAGEN) 10 MG CAPS Take 10 mg by mouth daily.    Yes  [provider]  atorvastatin (LIPITOR) 40 MG tablet TAKE 1 TABLET BY MOUTH ONCE DAILY 07/28/17  Yes McGowen, Adrian Blackwater, MD  calcium-vitamin D (OSCAL WITH D) 500-200 MG-UNIT per tablet Take 1 tablet by mouth every evening.    Yes [provider]  carboxymethylcellulose (REFRESH PLUS) 0.5 % SOLN Place 2 drops into both eyes daily.   Yes [provider]  Coenzyme Q10 200 MG capsule Take 200 mg by mouth every evening.    Yes [provider]  fluticasone (FLONASE) 50 MCG/ACT nasal spray Place 2 sprays into both nostrils daily.    Yes [provider]  furosemide (LASIX) 40 MG tablet TAKE 1 TABLET BY MOUTH TWICE DAILY 08/16/17  Yes McGowen, Adrian Blackwater, MD  losartan (COZAAR) 50 MG tablet take 1 tablet by mouth once daily 07/07/16  Yes McGowen, Adrian Blackwater, MD  metoprolol succinate (TOPROL-XL) 50 MG 24 hr tablet Take 75 mg by mouth daily. Patient has 25 mg tablets- takes three daily   Yes [provider]  Misc Natural Products (OSTEO BI-FLEX TRIPLE STRENGTH PO) Take 1 tablet by mouth daily.   Yes  [provider]  Misc Natural Products (TART CHERRY ADVANCED PO) Take 1 capsule by mouth daily.   Yes [provider]  Multiple Vitamins-Minerals (AIRBORNE PO) Take 1 tablet by mouth daily as needed (for vacation).    Yes [provider]  Multiple Vitamins-Minerals (CENTRUM SILVER ULTRA WOMENS) TABS Take 1 tablet by mouth every evening.    Yes [provider]  Omega-3 Fatty Acids (FISH OIL PO) Take 2 capsules by mouth every evening.    Yes [provider]  rivaroxaban (XARELTO) 20 MG TABS tablet Take 1 tablet (20 mg total) by mouth daily with supper. Patient taking differently: Take 20 mg by mouth at bedtime.  12/16/16  Yes Hilty, Nadean Corwin, MD  sodium chloride (OCEAN) 0.65 % nasal spray Place 2 sprays into the nose daily. After shower   Yes [provider]  trolamine salicylate (ASPERCREME) 10 % cream Apply 1 application topically daily.   Yes [provider]  TURMERIC PO Take 1 capsule by mouth daily.   Yes [provider]  albuterol (PROAIR HFA) 108 (90 Base) MCG/ACT inhaler INHALE 2 PUFFS INTO THE LUNGS EVERY 4 HOURS IF NEEDED FOR WHEEZING OR SHORTNESS OF BREATH(COUGH SHORTNESS OF BREATH AND WHEEZING) Patient taking differently: Inhale 2 puffs into the lungs every 4 (four) hours as needed for wheezing or shortness of breath.  07/27/17   McGowen, Adrian Blackwater, MD  amiodarone (PACERONE) 200 MG tablet Take 1 tablet (200 mg total) by mouth daily. Patient not taking: Reported on 08/29/2017 08/18/17   Barrett, Evelene Croon, PA-C    Inpatient Medications: Scheduled Meds: . amiodarone  200 mg Oral Daily  . atorvastatin  40 mg Oral q1800  . fluticasone  2 spray Each Nare Daily  . ipratropium-albuterol  3 mL Nebulization BID  . [START ON 08/31/2017] mouth rinse  15 mL Mouth Rinse BID  . rivaroxaban  15 mg Oral Q supper   Continuous Infusions: . cefTRIAXone (ROCEPHIN)  IV    . lactated ringers 500 mL (08/30/17 1046)   PRN  Meds: acetaminophen, albuterol, benzonatate, gi cocktail, ondansetron (ZOFRAN) IV  Allergies:    Allergies  Allergen Reactions  . Augmentin [Amoxicillin-Pot Clavulanate] Nausea And Vomiting and Other (See Comments)    "projectile vomiting" Has patient had a PCN reaction causing immediate rash, facial/tongue/throat swelling, SOB or lightheadedness with hypotension:No Has  patient had a PCN reaction causing severe rash involving mucus membranes or skin necrosis:No Has patient had a PCN reaction that required hospitalization:No Has patient had a PCN reaction occurring within the last 10 years:Yes If all of the above answers are "NO", then may proceed with Cephalosporin use.     Social History:   Social History   Socioeconomic History  . Marital status: Widowed    Spouse name: Not on file  . Number of children: Not on file  . Years of education: Not on file  . Highest education level: Not on file  Occupational History  . Not on file  Social Needs  . Financial resource strain: Not on file  . Food insecurity:    Worry: Not on file    Inability: Not on file  . Transportation needs:    Medical: Not on file    Non-medical: Not on file  Tobacco Use  . Smoking status: Never Smoker  . Smokeless tobacco: Never Used  Substance and Sexual Activity  . Alcohol use: Yes    Comment: rarely  . Drug use: No  . Sexual activity: Not on file  Lifestyle  . Physical activity:    Days per week: Not on file    Minutes per session: Not on file  . Stress: Not on file  Relationships  . Social connections:    Talks on phone: Not on file    Gets together: Not on file    Attends religious service: Not on file    Active member of club or organization: Not on file    Attends meetings of clubs or organizations: Not on file    Relationship status: Not on file  . Intimate partner violence:    Fear of current or ex partner: Not on file    Emotionally abused: Not on file    Physically abused: Not on  file    Forced sexual activity: Not on file  Other Topics Concern  . Not on file  Social History Narrative   Widow, 2 sons.   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   No tobacco.  Rare alcohol.   No drugs.  Exercise: 4 times per week, about 53m.    Family History:    Family History  Problem Relation Age of Onset  . Heart disease Mother   . Heart disease Father   . Hypertension Brother   . Diabetes Sister   . Colon cancer Neg Hx   . Pancreatic cancer Neg Hx   . Rectal cancer Neg Hx   . Stomach cancer Neg Hx      ROS:  Please see the history of present illness.   All other ROS reviewed and negative.     Physical Exam/Data:   Vitals:   08/30/17 0915 08/30/17 0930 08/30/17 0945 08/30/17 1000  BP: (!) 105/58 (!) 94/57 101/64 93/60  Pulse: 82 (!) 113 98 92  Resp: (!) 24 (!) 33 (!) 22 (!) 23  Temp:      TempSrc:      SpO2: 99% 95% 95% 93%  Weight:      Height:        Intake/Output Summary (Last 24 hours) at 08/30/2017 1052 Last data filed at 08/30/2017 0957 Gross per 24 hour  Intake 2167.75 ml  Output 475 ml  Net 1692.75 ml   Filed Weights   08/29/17 1132 08/29/17 1600 08/29/17 2330  Weight: 75.8 kg 78.1 kg 77.4 kg   Body mass index is 31.21 kg/m.  General:  Well nourished, well developed, obese female, in no acute distress HEENT: normal Lymph: no adenopathy Neck: no JVD Endocrine:  No thryomegaly Vascular: No carotid bruits; FA pulses 2+ bilaterally without bruits  Cardiac:  normal S1, S2; irregularly irregular rhythm; no murmur  Lungs:  clear to auscultation bilaterally, no wheezing, rhonchi or rales  Abd: soft, nontender, no hepatomegaly  Ext: no edema. Petechial rash noted on trunk, upper legs and tops of the feet.  Musculoskeletal:  No deformities, BUE and BLE strength normal and equal Skin: warm and dry  Neuro:  CNs 2-12 intact, no focal abnormalities noted Psych:  Normal affect   EKG:  The EKG was personally reviewed and demonstrates: Sinus rhythm in the  70s Telemetry:  Telemetry was personally reviewed and demonstrates:  afib in 70's-90's  Relevant CV Studies:  Echocardiogram 08/10/2017 Study Conclusions - Left ventricle: The cavity size was normal. Wall thickness was   increased in a pattern of mild LVH. Systolic function was normal.   The estimated ejection fraction was in the range of 50% to 55%.   Wall motion was normal; there were no regional wall motion   abnormalities. - Aortic valve: There was trivial regurgitation. - Aortic root: The aortic root was mildly dilated. - Mitral valve: There was mild regurgitation. - Left atrium: The atrium was severely dilated. - Right atrium: The atrium was mildly dilated. - Tricuspid valve: There was mild-moderate regurgitation. - Pulmonary arteries: PA peak pressure: 32 mm Hg (S). - Pericardium, extracardiac: A trivial pericardial effusion was   identified.  Impressions: - Normal LV systolic function; mild LVH; trace AI; mildly dilated   aortic root; mild MR; biatrial enlargement; mild to moderate TR.  Laboratory Data:  Chemistry Recent Labs  Lab 08/29/17 1145 08/29/17 2210 08/30/17 0521  NA 142 136 139  K 3.6 3.4* 3.8  CL 106 106 106  CO2 24 21* 22  GLUCOSE 110* 155* 130*  BUN 27* 28* 33*  CREATININE 1.57* 1.91* 2.24*  CALCIUM 8.4* 7.4* 7.6*  GFRNONAA 30* 24* 20*  GFRAA 35* 27* 23*  ANIONGAP 12 9 11     Recent Labs  Lab 08/29/17 2210 08/30/17 0521  PROT 5.3*  --   ALBUMIN 2.6* 2.6*  AST 20  --   ALT 26  --   ALKPHOS 53  --   BILITOT 1.0  --    Hematology Recent Labs  Lab 08/29/17 1145 08/29/17 2210 08/30/17 0521  WBC 8.2 10.4 10.1  RBC 5.04 4.27 4.26  HGB 14.6 12.3 12.2  HCT 46.8* 39.2 39.6  MCV 92.9 91.8 93.0  MCH 29.0 28.8 28.6  MCHC 31.2 31.4 30.8  RDW 14.7 15.0 15.1  PLT 144* 121* 131*   Cardiac Enzymes Recent Labs  Lab 08/29/17 1458 08/29/17 2217 08/30/17 0521  TROPONINI <0.03 <0.03 0.04*    Recent Labs  Lab 08/29/17 1151 08/29/17 1433   TROPIPOC 0.00 0.01    BNP Recent Labs  Lab 08/29/17 2210  BNP 1,028.9*    DDimer No results for input(s): DDIMER in the last 168 hours.  Radiology/Studies:  Dg Chest 2 View  Result Date: 08/29/2017 CLINICAL DATA:  Chest pain and shortness of breath atrial fibrillation. EXAM: CHEST - 2 VIEW COMPARISON:  08/04/2017 and 08/01/2017 and 02/19/2016 FINDINGS: Chronic cardiomegaly.  Aortic atherosclerosis. Pulmonary vascularity is normal. No infiltrates or effusions. Slight chronic accentuation of the thoracic kyphosis. IMPRESSION: Chronic cardiomegaly.  No acute abnormalities. Aortic Atherosclerosis (ICD10-I70.0). Electronically Signed   By: Lorriane Shire  M.D.   On: 08/29/2017 12:08   Dg Chest Port 1 View  Result Date: 08/30/2017 CLINICAL DATA:  Shortness of breath beginning today. EXAM: PORTABLE CHEST 1 VIEW COMPARISON:  08/29/2017.  08/04/2017. FINDINGS: Chronic cardiomegaly and aortic atherosclerosis. Mild chronic volume loss at the right lung base. Worsened appearance of the left lung base with increased volume loss and/or pneumonia. Probable associated effusion. Upper lungs are clear. IMPRESSION: Chronic cardiomegaly and aortic atherosclerosis. Worsening of abnormal density at the left lung base consistent with atelectasis and or pneumonia, probably with an associated effusion. Mild chronic volume loss at the right base. Electronically Signed   By: Nelson Chimes M.D.   On: 08/30/2017 08:31    Assessment and Plan:   Chest pain -Mild chest tightness "like fluid coming back" and mild DOE yesterday. No chest pain today.  -EKG without acute ischemic changes -Troponins negative x3 and then 0.04 this morning which is very mildly elevated -Normal LV function by recent echocardiogram 08/10/2017 -Admitted with fever 101. Possible PNA/sepsis. She has been started on IV antibiotics.  -CP may be a component of hypotension with decreased perfusion in setting of infection process, possibly PNA. -No  prior ischemic testing in Epic.  -Continue to treat hypotension, holding BP lowering meds.  -Will discuss any ischemic testing with Dr. Debara Pickett.   Persistent atrial fibrillation -Started on amiodarone load on 08/10/2017.  She was continued on Toprol-XL 75 mg daily, unable to uptitrate due to soft blood pressure.  Plan for cardioversion on 08/17/2017 however she was in sinus bradycardia at the time so the cardioversion was canceled. -She continues on Xarelto 20 mg daily for stroke risk reduction -Prior to admission she was on amio load with 400 mg BID and had converted to SR. Currently on amiodarone 200 mg daily.  Metoprolol is being held due to hypotension requiring saline boluses and Neo-Synephrine (now off) -Currently in afib, rates controlled in the 70's-90's. -Will increase amiodarone to 200 mg bid for rate control.  -Continue to monitor.   Hypotension -Per chart BP 101/57 on presentation. BP dropped reportedly after receiving amiodarone and metoprolol (although lower dose than her home dose).  -She was also febrile yesterday and this may be related to infection, possibly PNA and also in setting of multiple BP lowering cardiac meds.  -continue to hold BP meds and support with IV fluids.   Acute kidney injury -Serum creatinine has risen from 1.5 on admission to 2.24 today.  Likely related to hypoperfusion with low blood pressures.  -BP support -Continue to monitor renal function  Chronic diastolic heart failure -Outpatient management includes Lasix 40 mg twice daily, losartan 50 mg daily, Toprol-XL 75 mg daily.  All meds are currently on hold due to hypotension requiring IV fluid boluses and Neo-Synephrine -BNP last night was 1028.9 -Chest x-ray did not show acute pulmonary edema but today's x-ray with worsening of abnormal density at the left lung base consistent with atelectasis and/or pneumonia, probably with an associated effusion.  -currently she is stable with only mild DOE. She may  develop worse volume overload with administration of IV fluids to support BP. -No diuretics at this time due to hypotension. May need diuretic if she develops respiratory difficulty.   Hyperlipidemia -On Atorvastatin 40 mg and omega-3 fatty acids at home. Continue statin.   History of NASH -LFTs normal   For questions or updates, please contact [facility] Please consult [email or web address] for contact info under Cardiology/STEMI.   Signed, Daune Perch, NP  08/30/2017 10:52 AM

## 2017-08-30 NOTE — Progress Notes (Signed)
AWV reviewed and agree. Signed:  Crissie Sickles, MD           08/30/2017

## 2017-08-31 DIAGNOSIS — J180 Bronchopneumonia, unspecified organism: Secondary | ICD-10-CM

## 2017-08-31 DIAGNOSIS — I480 Paroxysmal atrial fibrillation: Secondary | ICD-10-CM

## 2017-08-31 DIAGNOSIS — I214 Non-ST elevation (NSTEMI) myocardial infarction: Secondary | ICD-10-CM

## 2017-08-31 LAB — BASIC METABOLIC PANEL
Anion gap: 15 (ref 5–15)
BUN: 33 mg/dL — ABNORMAL HIGH (ref 8–23)
CHLORIDE: 103 mmol/L (ref 98–111)
CO2: 19 mmol/L — AB (ref 22–32)
Calcium: 8 mg/dL — ABNORMAL LOW (ref 8.9–10.3)
Creatinine, Ser: 1.92 mg/dL — ABNORMAL HIGH (ref 0.44–1.00)
GFR calc Af Amer: 27 mL/min — ABNORMAL LOW (ref >60)
GFR calc non Af Amer: 24 mL/min — ABNORMAL LOW (ref >60)
GLUCOSE: 119 mg/dL — AB (ref 70–99)
Potassium: 3.5 mmol/L (ref 3.5–5.1)
Sodium: 137 mmol/L (ref 135–145)

## 2017-08-31 LAB — CBC WITH DIFFERENTIAL/PLATELET
Abs Immature Granulocytes: 0.1 10*3/uL (ref 0.0–0.1)
BASOS PCT: 0 %
Basophils Absolute: 0 10*3/uL (ref 0.0–0.1)
EOS PCT: 1 %
Eosinophils Absolute: 0.1 10*3/uL (ref 0.0–0.7)
HEMATOCRIT: 41 % (ref 36.0–46.0)
Hemoglobin: 12.8 g/dL (ref 12.0–15.0)
Immature Granulocytes: 1 %
LYMPHS ABS: 1.2 10*3/uL (ref 0.7–4.0)
Lymphocytes Relative: 12 %
MCH: 28.7 pg (ref 26.0–34.0)
MCHC: 31.2 g/dL (ref 30.0–36.0)
MCV: 91.9 fL (ref 78.0–100.0)
MONO ABS: 0.7 10*3/uL (ref 0.1–1.0)
Monocytes Relative: 8 %
Neutro Abs: 7.4 10*3/uL (ref 1.7–7.7)
Neutrophils Relative %: 78 %
PLATELETS: 113 10*3/uL — AB (ref 150–400)
RBC: 4.46 MIL/uL (ref 3.87–5.11)
RDW: 15 % (ref 11.5–15.5)
WBC: 9.5 10*3/uL (ref 4.0–10.5)

## 2017-08-31 LAB — TROPONIN I
Troponin I: 0.4 ng/mL (ref <0.03)
Troponin I: 0.11 ng/mL (ref <0.03)

## 2017-08-31 LAB — APTT: aPTT: 53 seconds — ABNORMAL HIGH (ref 24–36)

## 2017-08-31 LAB — HEPARIN LEVEL (UNFRACTIONATED): Heparin Unfractionated: >2.2 IU/mL — ABNORMAL HIGH (ref 0.30–0.70)

## 2017-08-31 LAB — PHOSPHORUS: Phosphorus: 2.9 mg/dL (ref 2.5–4.6)

## 2017-08-31 LAB — MAGNESIUM: MAGNESIUM: 2 mg/dL (ref 1.7–2.4)

## 2017-08-31 LAB — LEGIONELLA PNEUMOPHILA SEROGP 1 UR AG: L. pneumophila Serogp 1 Ur Ag: NEGATIVE

## 2017-08-31 LAB — PROCALCITONIN: Procalcitonin: 1.18 ng/mL

## 2017-08-31 MED ORDER — SODIUM CHLORIDE 0.9 % WEIGHT BASED INFUSION: 1.0000 mL/kg/h | INTRAVENOUS | Status: DC

## 2017-08-31 MED ORDER — METOPROLOL SUCCINATE ER 50 MG PO TB24: 50.0000 mg | ORAL_TABLET | Freq: Every day | ORAL | Status: DC

## 2017-08-31 MED ORDER — HEPARIN (PORCINE) IN NACL 100-0.45 UNIT/ML-% IJ SOLN
900.0000 [IU]/h | INTRAMUSCULAR | Status: DC
  Administered 2017-08-31: 900 [IU]/h via INTRAVENOUS
  Filled 2017-08-31: qty 250

## 2017-08-31 MED ORDER — SODIUM CHLORIDE 0.9 % IV SOLN: 250.0000 mL | INTRAVENOUS | Status: DC | PRN

## 2017-08-31 MED ORDER — SODIUM CHLORIDE 0.9 % WEIGHT BASED INFUSION
3.0000 mL/kg/h | INTRAVENOUS | Status: AC
  Administered 2017-09-01: 3 mL/kg/h via INTRAVENOUS

## 2017-08-31 MED ORDER — SODIUM CHLORIDE 0.9% FLUSH: 3.0000 mL | INTRAVENOUS | Status: DC | PRN

## 2017-08-31 MED ORDER — ASPIRIN 81 MG PO CHEW
81.0000 mg | CHEWABLE_TABLET | Freq: Every day | ORAL | Status: DC
  Administered 2017-08-31 – 2017-09-01 (×2): 81 mg via ORAL
  Filled 2017-08-31 (×2): qty 1

## 2017-08-31 MED ORDER — SODIUM CHLORIDE 0.9% FLUSH
3.0000 mL | Freq: Two times a day (BID) | INTRAVENOUS | Status: DC
  Administered 2017-08-31: 10 mL via INTRAVENOUS

## 2017-08-31 MED ORDER — METOPROLOL TARTRATE 25 MG PO TABS
25.0000 mg | ORAL_TABLET | Freq: Two times a day (BID) | ORAL | Status: DC
  Administered 2017-08-31 – 2017-09-03 (×7): 25 mg via ORAL
  Filled 2017-08-31 (×2): qty 2
  Filled 2017-08-31: qty 1
  Filled 2017-08-31 (×2): qty 2
  Filled 2017-08-31: qty 1
  Filled 2017-08-31: qty 2

## 2017-08-31 NOTE — Progress Notes (Signed)
PROGRESS NOTE    Brenda Matthews  NAT:[id number] DOB: 1935/04/17 DOA: 08/29/2017 PCP: [name], MD    Brief Narrative: Brenda Matthews is a very pleasant 82 y.o. female with medical history significant for paroxysmal A. Fib on anticoagulation, hypertension, hyperlipidemia, chronic renal insufficiency stage II, autoimmune hepatitis presents to the emergency department with the chief complaint of chest pain shortness of breath generalized weakness. Initial evaluation reveals max temp of 100.6, EKG with atrial fibrillation and heart rate of 106, acute kidney injury. [facility] are asked to admit  Information is obtained from the patient and the chart. She states she was in her usual state of health until this morning she awakened with chest discomfort. She describes this discomfort as a "pressure" and is located across the chest just below clavicles on left and right. She says it's worse when she talked and when she touches that area of her chest. Associated symptoms include generalized weakness shortness of breath moist sounding but nonproductive cough. She states over the last couple of weeks her heart rhythm has been going "in and out of A. Fib". She has had her metoprolol adjusted and her Lasix adjusted. She states she had an appointment for cardioversion but her rhythm converted on its own so that was canceled. He denies any feeling of palpitations headache dizziness syncope or near-syncope. She denies nausea vomiting diarrhea constipation melena bright red blood per rectum. She denies dysuria hematuria frequency or urgency. She denies lower extremity edema or arthropathy at.  Patient was transfer to ICU overnight due to hypotension. She received IV bolus, started on neo/. Now off pressors. Diagnosed with PNA. Started on ceftriaxone. Renal function improving. HR elevated, resume metoprolol.  Transfer to step down unit.     Assessment & Plan:   Principal Problem:   Sepsis  ([place]) Active Problems:   Hyperlipidemia   Essential hypertension   Chronic renal insufficiency, stage II (mild)   Bronchopneumonia   PAF (paroxysmal atrial fibrillation) (HCC)   Chronic anticoagulation   Chest pain   DOE (dyspnea on exertion)   Arterial hypotension   Acute-on-chronic kidney injury ([place])   Rash in adult   Atrial fibrillation and flutter ([place])   1-Hypotension; Diferrential cardiogenic vs sepsis.  Received IV bolus. Was on neo overnight.  Blood cultures. No growth.  Pro-calcitonin and lactic acid normal.  CCM was consulted and following.  Chest x ray with PNA, started  IV antibiotics. Ceftriaxone  BO stable today.   2-Dyspnea; EKG changes, mild elevation troponin.  Cardiology consulted. Planning cath tomorrow, depending on renal function.  Will hold lasix due to hypotension.  Feeling better.   3-Pneumonia;  Continue with  IV ceftriaxone.  Sputum culture,.  Strept Pneumonia negative.   -Rash; Dr Chase Caller consulted surgery. Vasculitis panel pending.  Rash improving, hold on biopsy.  Suspect related to thrombocytopenia.   4-Paroxysmal A fib;  Continue with amiodarone and xarelto.  Resume metoprolol, now that BP has improved.   5-HTN; hold lasix,  losartan.   6-Hyperlipidemia; continue with statins.   7-Acute on chronic renal failure stage II; Avoid hypotension.  Renal US negative for hydronephrosis.   8-History of NASH;     DVT prophylaxis:on xarelto.  Code Status: Full code.  Family Communication: care discussed with patient.  Disposition Plan: remain in the ICU   Consultants:   Cardiology  CCM   Procedures:   Antimicrobials: Ceftriaxone 8-14  Subjective: Feeling better, dyspnea improved.  Denies chest pain  Looks better  Objective: Vitals:   08/31/17 0725 08/31/17 0730 08/31/17 0735 08/31/17 0740  BP: 120/77 126/79    Pulse: (!) 119     Resp: (!) 33 (!) 32    Temp:    98.1 F (36.7 C)  TempSrc:    Oral  SpO2: 95%   93%   Weight:      Height:        Intake/Output Summary (Last 24 hours) at 08/31/2017 0829 Last data filed at 08/31/2017 0750 Gross per 24 hour  Intake 689.3 ml  Output 1480 ml  Net -790.7 ml   Filed Weights   08/29/17 1132 08/29/17 1600 08/29/17 2330  Weight: 75.8 kg 78.1 kg 77.4 kg    Examination:  General exam: NAD Respiratory system: CTA Cardiovascular system: S 1, S 2 IRR Gastrointestinal system: BS present, soft,nt Central nervous system: non focal.  Extremities: symmetric power.  Skin: no rashes.   Data Reviewed: I have personally reviewed following labs and imaging studies  CBC: Recent Labs  Lab 08/29/17 1145 08/29/17 2210 08/30/17 0521 08/31/17 0018  WBC 8.2 10.4 10.1 9.5  NEUTROABS  --  8.6*  --  7.4  HGB 14.6 12.3 12.2 12.8  HCT 46.8* 39.2 39.6 41.0  MCV 92.9 91.8 93.0 91.9  PLT 144* 121* 131* 591*   Basic Metabolic Panel: Recent Labs  Lab 08/29/17 1145 08/29/17 2210 08/30/17 0034 08/30/17 0521 08/30/17 1831 08/31/17 0018  NA 142 136  --  139 136 137  K 3.6 3.4*  --  3.8 3.6 3.5  CL 106 106  --  106 103 103  CO2 24 21*  --  22 22 19*  GLUCOSE 110* 155*  --  130* 136* 119*  BUN 27* 28*  --  33* 33* 33*  CREATININE 1.57* 1.91*  --  2.24* 2.07* 1.92*  CALCIUM 8.4* 7.4*  --  7.6* 7.9* 8.0*  MG  --   --  2.0  --   --  2.0  PHOS  --   --   --  3.1  --  2.9   GFR: Estimated Creatinine Clearance: 22.5 mL/min (A) (by C-G formula based on SCr of 1.92 mg/dL (H)). Liver Function Tests: Recent Labs  Lab 08/29/17 2210 08/30/17 0521  AST 20  --   ALT 26  --   ALKPHOS 53  --   BILITOT 1.0  --   PROT 5.3*  --   ALBUMIN 2.6* 2.6*   No results for input(s): LIPASE, AMYLASE in the last 168 hours. No results for input(s): AMMONIA in the last 168 hours. Coagulation Profile: Recent Labs  Lab 08/29/17 1145  INR 1.65   Cardiac Enzymes: Recent Labs  Lab 08/29/17 1458 08/29/17 2217 08/30/17 0521 08/30/17 1109 08/31/17 0018  TROPONINI <0.03  <0.03 0.04* 0.04* 0.40*   BNP (last 3 results) Recent Labs    08/01/17 1017 08/04/17 1447  PROBNP 631.0* 574.0*   HbA1C: No results for input(s): HGBA1C in the last 72 hours. CBG: Recent Labs  Lab 08/29/17 2330  GLUCAP 124*   Lipid Profile: No results for input(s): CHOL, HDL, LDLCALC, TRIG, CHOLHDL, LDLDIRECT in the last 72 hours. Thyroid Function Tests: No results for input(s): TSH, T4TOTAL, FREET4, T3FREE, THYROIDAB in the last 72 hours. Anemia Panel: No results for input(s): VITAMINB12, FOLATE, FERRITIN, TIBC, IRON, RETICCTPCT in the last 72 hours. Sepsis Labs: Recent Labs  Lab 08/29/17 1436 08/29/17 1458 08/29/17 2210 08/30/17 0034 08/30/17 1109 08/31/17 0018  PROCALCITON  --  0.21  --   --  1.46 1.18  LATICACIDVEN 0.66  --  1.2 1.7  --   --     Recent Results (from the past 240 hour(s))  Respiratory Panel by PCR     Status: None   Collection Time: 08/29/17  4:30 PM  Result Value Ref Range Status   Adenovirus NOT DETECTED NOT DETECTED Final   Coronavirus 229E NOT DETECTED NOT DETECTED Final   Coronavirus HKU1 NOT DETECTED NOT DETECTED Final   Coronavirus NL63 NOT DETECTED NOT DETECTED Final   Coronavirus OC43 NOT DETECTED NOT DETECTED Final   Metapneumovirus NOT DETECTED NOT DETECTED Final   Rhinovirus / Enterovirus NOT DETECTED NOT DETECTED Final   Influenza A NOT DETECTED NOT DETECTED Final   Influenza B NOT DETECTED NOT DETECTED Final   Parainfluenza Virus 1 NOT DETECTED NOT DETECTED Final   Parainfluenza Virus 2 NOT DETECTED NOT DETECTED Final   Parainfluenza Virus 3 NOT DETECTED NOT DETECTED Final   Parainfluenza Virus 4 NOT DETECTED NOT DETECTED Final   Respiratory Syncytial Virus NOT DETECTED NOT DETECTED Final   Bordetella pertussis NOT DETECTED NOT DETECTED Final   Chlamydophila pneumoniae NOT DETECTED NOT DETECTED Final   Mycoplasma pneumoniae NOT DETECTED NOT DETECTED Final    Comment: Performed at [facility], [address],  [place], [place] [address]  MRSA PCR Screening     Status: None   Collection Time: 08/29/17 11:29 PM  Result Value Ref Range Status   MRSA by PCR NEGATIVE NEGATIVE Final    Comment:        The GeneXpert MRSA Assay (FDA approved for NASAL specimens only), is one component of a comprehensive MRSA colonization surveillance program. It is not intended to diagnose MRSA infection nor to guide or monitor treatment for MRSA infections. Performed at [facility], [place] [address], [place], [place] [address]          Radiology Studies: Dg Chest 2 View  Result Date: 08/29/2017 CLINICAL DATA:  Chest pain and shortness of breath atrial fibrillation. EXAM: CHEST - 2 VIEW COMPARISON:  08/04/2017 and 08/01/2017 and 02/19/2016 FINDINGS: Chronic cardiomegaly.  Aortic atherosclerosis. Pulmonary vascularity is normal. No infiltrates or effusions. Slight chronic accentuation of the thoracic kyphosis. IMPRESSION: Chronic cardiomegaly.  No acute abnormalities. Aortic Atherosclerosis (ICD10-I70.0). Electronically Signed   By: Lorriane Shire M.D.   On: 08/29/2017 12:08   US Renal  Result Date: 08/30/2017 CLINICAL DATA:  Initial evaluation for acute renal injury. EXAM: RENAL / URINARY TRACT ULTRASOUND COMPLETE COMPARISON:  None. FINDINGS: Right Kidney: Length: 11.4 cm. Echogenicity within normal limits. No hydronephrosis. 1.3 x 1.0 x 1.2 cm simple cyst present at the lower pole. Left Kidney: Length: 11.6 cm. Echogenicity within normal limits. No mass or hydronephrosis visualized. Bladder: Decompressed with a Foley catheter in place. IMPRESSION: 1. No hydronephrosis. 2. 1.3 cm simple right renal cyst. 3. Foley catheter in place. Electronically Signed   By: Jeannine Boga M.D.   On: 08/30/2017 14:25   Dg Chest Port 1 View  Result Date: 08/30/2017 CLINICAL DATA:  Shortness of breath beginning today. EXAM: PORTABLE CHEST 1 VIEW COMPARISON:  08/29/2017.  08/04/2017. FINDINGS: Chronic cardiomegaly and aortic  atherosclerosis. Mild chronic volume loss at the right lung base. Worsened appearance of the left lung base with increased volume loss and/or pneumonia. Probable associated effusion. Upper lungs are clear. IMPRESSION: Chronic cardiomegaly and aortic atherosclerosis. Worsening of abnormal density at the left lung base consistent with atelectasis and or pneumonia, probably with an associated effusion.  Mild chronic volume loss at the right base. Electronically Signed   By: Nelson Chimes M.D.   On: 08/30/2017 08:31        Scheduled Meds: . amiodarone  200 mg Oral BID  . atorvastatin  40 mg Oral q1800  . fluticasone  2 spray Each Nare Daily  . ipratropium-albuterol  3 mL Nebulization BID  . mouth rinse  15 mL Mouth Rinse BID  . metoprolol tartrate  25 mg Oral BID  . rivaroxaban  15 mg Oral Q supper   Continuous Infusions: . cefTRIAXone (ROCEPHIN)  IV 1 g (08/30/17 1138)     LOS: 1 day    Time spent: 35 minutes.     Elmarie Shiley, MD [facility] Pager [phone]  If 7PM-7AM, please contact night-coverage [email or web address] Password [facility] 08/31/2017, 8:29 AM

## 2017-08-31 NOTE — Progress Notes (Signed)
CRITICAL VALUE ALERT  Critical Value:  Troponin 0.40  Date & Time Notied:  08/31/2017 0150  Provider Notified: Warren Lacy  Orders Received/Actions taken: awaiting orders

## 2017-08-31 NOTE — Progress Notes (Signed)
ANTICOAGULATION CONSULT NOTE - Initial Consult  Pharmacy Consult for Heparin Indication: atrial fibrillation and NSTEMI  Allergies  Allergen Reactions  . Augmentin [Amoxicillin-Pot Clavulanate] Nausea And Vomiting and Other (See Comments)    "projectile vomiting" Has patient had a PCN reaction causing immediate rash, facial/tongue/throat swelling, SOB or lightheadedness with hypotension:No Has patient had a PCN reaction causing severe rash involving mucus membranes or skin necrosis:No Has patient had a PCN reaction that required hospitalization:No Has patient had a PCN reaction occurring within the last 10 years:Yes If all of the above answers are "NO", then may proceed with Cephalosporin use.     Patient Measurements: Height: 5' 2"  (157.5 cm) Weight: 170 lb 10.2 oz (77.4 kg) IBW/kg (Calculated) : 50.1 Heparin Dosing Weight: 67.1 kg  Vital Signs: Temp: 98.4 F (36.9 C) (08/15 1140) Temp Source: Oral (08/15 1140) BP: 108/68 (08/15 1000) Pulse Rate: 101 (08/15 1200)  Labs: Recent Labs    08/29/17 1145  08/29/17 2210  08/30/17 0521 08/30/17 1109 08/30/17 1831 08/31/17 0018  HGB 14.6  --  12.3  --  12.2  --   --  12.8  HCT 46.8*  --  39.2  --  39.6  --   --  41.0  PLT 144*  --  121*  --  131*  --   --  113*  LABPROT 19.3*  --   --   --   --   --   --   --   INR 1.65  --   --   --   --   --   --   --   CREATININE 1.57*  --  1.91*  --  2.24*  --  2.07* 1.92*  TROPONINI  --    < >  --    < > 0.04* 0.04*  --  0.40*   < > = values in this interval not displayed.    Estimated Creatinine Clearance: 22.5 mL/min (A) (by C-G formula based on SCr of 1.92 mg/dL (H)).   Medical History: Past Medical History:  Diagnosis Date  . Abnormal EKG approx 2008   Nuclear stress test neg;   . Anxiety    with panic  . Arthritis   . Autoimmune hepatitis ([place])   . Cataract    s/p surgery--lens implants  . Chronic renal insufficiency, stage II (mild) 12/04/2012   GFR 60s  . Cirrhosis  ([place])   . Diverticulosis 2009  . Fracture of radial shaft, left, closed 11/16/10   fell down flight of stairs  . History of kidney stones   . Hx of adenomatous colonic polyps 2002   surveillance colonoscopy 2009, +polypectomy done-tubular adenoma w/out high grade.  05/2013 tubular adenomas--recall 3 yrs  . Hyperlipidemia   . Hypertension   . Low TSH level 02/18/2016   T3 norm, T4 mildly elevated--suspected sick euthyroid syndrome.  Repeat labs 06/2016: normal.  . NASH (nonalcoholic steatohepatitis)   . Osteopenia    DEXA 08/2010; repeat DEXA 02/2015 worse: fosamax started  . PAF (paroxysmal atrial fibrillation) ([place]) 02/2016   when in post-op for ankle surgery; spontaneously converted in hosp, seen by Dr. Debara Pickett in consultation--metoprolol rate control + xarelto recommended.  Metop d/c due to hypot.  Plan to cont xarelto 20 mg qd indef due to CHAD-VASc score of 3.  A-fib w/RVR and CHF 07/2017; pt placed on amiodarone and plan for CV, but pt was in sinus rhythm when she went in for her DC CV, so she was sent  home.  . Peripheral edema   . Pneumonia 2015   hx  . Rheumatic fever      Assessment: 82 year old female on Xarelto prior to admission for atrial fibrillation admitted with chest pain and SOB. Cardiology wants to perform left cardiac cath, so Xarelto was held today and switching to IV Heparin.   Last dose of Xarelto was at 17:09PM on 8/14.  Baseline HL >2.20 reflecting recent Xarelto dose.  Baseline aPTT 53 - elevated some.  Utilizing aPTT for now until aPTT and Heparin level are correlating.  Platelets are trending down at 113 (130 yesterday). H/H is stable. No bleeding noted.   Goal of Therapy:  Heparin level 0.3-0.7 units/ml aPTT 66-102 seconds Monitor platelets by anticoagulation protocol: Yes   Plan:  Start Heparin at 1700 PM (24hrs after last Xarelto dose) at rate of 900 units/hr (no bolus).  Check aPTT and HL in 8 hours after initiate.  Daily aPTT and HL  Sloan Leiter,  PharmD, BCPS, BCCCP Clinical Pharmacist Clinical phone 08/31/2017 until 3:30pm - [phone] After hours, please call [phone] 08/31/2017,12:36 PM

## 2017-08-31 NOTE — Progress Notes (Signed)
DAILY PROGRESS NOTE   Patient Name: Brenda Matthews Date of Encounter: 08/31/2017  Chief Complaint   No further chest pain  Patient Profile   Brenda Matthews is a 82 y.o. female with a hx that includes PAF on Xarelto, chronic diastolic CHF, diverticulosis, NASH, HTN, HLD, hypothyroidism who is being seen today for the evaluation of chest pain at the request of Dr. Debara Pickett.  Subjective   Blood pressure has now normalized. Remains in a-fib with RVR. Labs however, have shown a new troponin elevation to 0.4 (from 0.04) overnight. Creatinine is improving (1.92 today, from 2.24). Platelets lower today at 113 (from 131).   Objective   Vitals:   08/31/17 0800 08/31/17 0900 08/31/17 0908 08/31/17 1000  BP: 120/64 115/71 115/71 108/68  Pulse: (!) 113 (!) 121 (!) 130 (!) 103  Resp: (!) 33 (!) 31  (!) 28  Temp:      TempSrc:      SpO2: 92% 94%  92%  Weight:      Height:        Intake/Output Summary (Last 24 hours) at 08/31/2017 1111 Last data filed at 08/31/2017 1000 Gross per 24 hour  Intake 609.3 ml  Output 1480 ml  Net -870.7 ml   Filed Weights   08/29/17 1132 08/29/17 1600 08/29/17 2330  Weight: 75.8 kg 78.1 kg 77.4 kg    Physical Exam   General appearance: alert and no distress Lungs: clear to auscultation bilaterally Heart: irregularly irregular rhythm Extremities: extremities normal, atraumatic, no cyanosis or edema Neurologic: Grossly normal  Inpatient Medications    Scheduled Meds: . amiodarone  200 mg Oral BID  . atorvastatin  40 mg Oral q1800  . fluticasone  2 spray Each Nare Daily  . ipratropium-albuterol  3 mL Nebulization BID  . mouth rinse  15 mL Mouth Rinse BID  . metoprolol tartrate  25 mg Oral BID  . rivaroxaban  15 mg Oral Q supper    Continuous Infusions: . cefTRIAXone (ROCEPHIN)  IV 1 g (08/31/17 0917)    PRN Meds: acetaminophen, albuterol, benzonatate, gi cocktail, ondansetron (ZOFRAN) IV   Labs   Results for orders placed or performed during  the hospital encounter of 08/29/17 (from the past 48 hour(s))  Basic metabolic panel     Status: Abnormal   Collection Time: 08/29/17 11:45 AM  Result Value Ref Range   Sodium 142 135 - 145 mmol/L   Potassium 3.6 3.5 - 5.1 mmol/L   Chloride 106 98 - 111 mmol/L   CO2 24 22 - 32 mmol/L   Glucose, Bld 110 (H) 70 - 99 mg/dL   BUN 27 (H) 8 - 23 mg/dL   Creatinine, Ser 1.57 (H) 0.44 - 1.00 mg/dL   Calcium 8.4 (L) 8.9 - 10.3 mg/dL   GFR calc non Af Amer 30 (L) >60 mL/min   GFR calc Af Amer 35 (L) >60 mL/min    Comment: (NOTE) The eGFR has been calculated using the CKD EPI equation. This calculation has not been validated in all clinical situations. eGFR's persistently <60 mL/min signify possible Chronic Kidney Disease.    Anion gap 12 5 - 15    Comment: Performed at [facility] [address], [place] [address]  CBC     Status: Abnormal   Collection Time: 08/29/17 11:45 AM  Result Value Ref Range   WBC 8.2 4.0 - 10.5 K/uL   RBC 5.04 3.87 - 5.11 MIL/uL   Hemoglobin 14.6 12.0 -  15.0 g/dL   HCT 46.8 (H) 36.0 - 46.0 %   MCV 92.9 78.0 - 100.0 fL   MCH 29.0 26.0 - 34.0 pg   MCHC 31.2 30.0 - 36.0 g/dL   RDW 14.7 11.5 - 15.5 %   Platelets 144 (L) 150 - 400 K/uL    Comment: Performed at [facility] [address] [place], [place] [address]  Protime-INR (order if Patient is taking Coumadin / Warfarin)     Status: Abnormal   Collection Time: 08/29/17 11:45 AM  Result Value Ref Range   Prothrombin Time 19.3 (H) 11.4 - 15.2 seconds   INR 1.65     Comment: Performed at [facility] [address] [place], [place] [address]  I-stat troponin, ED     Status: None   Collection Time: 08/29/17 11:51 AM  Result Value Ref Range   Troponin i, poc 0.00 0.00 - 0.08 ng/mL   Comment 3            Comment: Due to the release kinetics of cTnI, a negative result within the first hours of the onset of symptoms does not rule out myocardial infarction with certainty. If  myocardial infarction is still suspected, repeat the test at appropriate intervals.   I-stat troponin, ED     Status: None   Collection Time: 08/29/17  2:33 PM  Result Value Ref Range   Troponin i, poc 0.01 0.00 - 0.08 ng/mL   Comment 3            Comment: Due to the release kinetics of cTnI, a negative result within the first hours of the onset of symptoms does not rule out myocardial infarction with certainty. If myocardial infarction is still suspected, repeat the test at appropriate intervals.   I-Stat CG4 Lactic Acid, ED     Status: None   Collection Time: 08/29/17  2:36 PM  Result Value Ref Range   Lactic Acid, Venous 0.66 0.5 - 1.9 mmol/L  Troponin I-serum (one time only)     Status: None   Collection Time: 08/29/17  2:58 PM  Result Value Ref Range   Troponin I <0.03 <0.03 ng/mL    Comment: Performed at [facility], [place] [address], [place], [place] [address]  Procalcitonin - Baseline     Status: None   Collection Time: 08/29/17  2:58 PM  Result Value Ref Range   Procalcitonin 0.21 ng/mL    Comment:        Interpretation: PCT (Procalcitonin) <= 0.5 ng/mL: Systemic infection (sepsis) is not likely. Local bacterial infection is possible. (NOTE)       Sepsis PCT Algorithm           Lower Respiratory Tract                                      Infection PCT Algorithm    ----------------------------     ----------------------------         PCT < 0.25 ng/mL                PCT < 0.10 ng/mL         Strongly encourage             Strongly discourage   discontinuation of antibiotics    initiation of antibiotics    ----------------------------     -----------------------------  PCT 0.25 - 0.50 ng/mL            PCT 0.10 - 0.25 ng/mL               OR       >80% decrease in PCT            Discourage initiation of                                            antibiotics      Encourage discontinuation           of antibiotics    ----------------------------      -----------------------------         PCT >= 0.50 ng/mL              PCT 0.26 - 0.50 ng/mL               AND        <80% decrease in PCT             Encourage initiation of                                             antibiotics       Encourage continuation           of antibiotics    ----------------------------     -----------------------------        PCT >= 0.50 ng/mL                  PCT > 0.50 ng/mL               AND         increase in PCT                  Strongly encourage                                      initiation of antibiotics    Strongly encourage escalation           of antibiotics                                     -----------------------------                                           PCT <= 0.25 ng/mL                                                 OR                                        > 80% decrease in PCT  Discontinue / Do not initiate                                             antibiotics Performed at [facility], [place] [address], [place], [place] [address]   Respiratory Panel by PCR     Status: None   Collection Time: 08/29/17  4:30 PM  Result Value Ref Range   Adenovirus NOT DETECTED NOT DETECTED   Coronavirus 229E NOT DETECTED NOT DETECTED   Coronavirus HKU1 NOT DETECTED NOT DETECTED   Coronavirus NL63 NOT DETECTED NOT DETECTED   Coronavirus OC43 NOT DETECTED NOT DETECTED   Metapneumovirus NOT DETECTED NOT DETECTED   Rhinovirus / Enterovirus NOT DETECTED NOT DETECTED   Influenza A NOT DETECTED NOT DETECTED   Influenza B NOT DETECTED NOT DETECTED   Parainfluenza Virus 1 NOT DETECTED NOT DETECTED   Parainfluenza Virus 2 NOT DETECTED NOT DETECTED   Parainfluenza Virus 3 NOT DETECTED NOT DETECTED   Parainfluenza Virus 4 NOT DETECTED NOT DETECTED   Respiratory Syncytial Virus NOT DETECTED NOT DETECTED   Bordetella pertussis NOT DETECTED NOT DETECTED   Chlamydophila pneumoniae NOT DETECTED NOT DETECTED    Mycoplasma pneumoniae NOT DETECTED NOT DETECTED    Comment: Performed at [facility] [address] [place], [place] [address]  Comprehensive metabolic panel     Status: Abnormal   Collection Time: 08/29/17 10:10 PM  Result Value Ref Range   Sodium 136 135 - 145 mmol/L   Potassium 3.4 (L) 3.5 - 5.1 mmol/L   Chloride 106 98 - 111 mmol/L   CO2 21 (L) 22 - 32 mmol/L   Glucose, Bld 155 (H) 70 - 99 mg/dL   BUN 28 (H) 8 - 23 mg/dL   Creatinine, Ser 1.91 (H) 0.44 - 1.00 mg/dL   Calcium 7.4 (L) 8.9 - 10.3 mg/dL   Total Protein 5.3 (L) 6.5 - 8.1 g/dL   Albumin 2.6 (L) 3.5 - 5.0 g/dL   AST 20 15 - 41 U/L   ALT 26 0 - 44 U/L   Alkaline Phosphatase 53 38 - 126 U/L   Total Bilirubin 1.0 0.3 - 1.2 mg/dL   GFR calc non Af Amer 24 (L) >60 mL/min   GFR calc Af Amer 27 (L) >60 mL/min    Comment: (NOTE) The eGFR has been calculated using the CKD EPI equation. This calculation has not been validated in all clinical situations. eGFR's persistently <60 mL/min signify possible Chronic Kidney Disease.    Anion gap 9 5 - 15    Comment: Performed at [facility] [address], [place], [place] [address]  CBC with Differential/Platelet     Status: Abnormal   Collection Time: 08/29/17 10:10 PM  Result Value Ref Range   WBC 10.4 4.0 - 10.5 K/uL   RBC 4.27 3.87 - 5.11 MIL/uL   Hemoglobin 12.3 12.0 - 15.0 g/dL   HCT 39.2 36.0 - 46.0 %   MCV 91.8 78.0 - 100.0 fL   MCH 28.8 26.0 - 34.0 pg   MCHC 31.4 30.0 - 36.0 g/dL   RDW 15.0 11.5 - 15.5 %   Platelets 121 (L) 150 - 400 K/uL   Neutrophils Relative % 82 %   Neutro Abs 8.6 (H) 1.7 - 7.7 K/uL   Lymphocytes Relative 8 %   Lymphs Abs 0.8 0.7 - 4.0  K/uL   Monocytes Relative 9 %   Monocytes Absolute 0.9 0.1 - 1.0 K/uL   Eosinophils Relative 0 %   Eosinophils Absolute 0.0 0.0 - 0.7 K/uL   Basophils Relative 0 %   Basophils Absolute 0.0 0.0 - 0.1 K/uL   Immature Granulocytes 1 %   Abs Immature Granulocytes 0.1 0.0 - 0.1 K/uL    Comment:  Performed at [facility] [address], [place], [place] [address]  Brain natriuretic peptide     Status: Abnormal   Collection Time: 08/29/17 10:10 PM  Result Value Ref Range   B Natriuretic Peptide 1,028.9 (H) 0.0 - 100.0 pg/mL    Comment: Performed at [facility] [address], [place], [place] [address]  Lactic acid, plasma     Status: None   Collection Time: 08/29/17 10:10 PM  Result Value Ref Range   Lactic Acid, Venous 1.2 0.5 - 1.9 mmol/L    Comment: Performed at [facility] [address] [place], [place] [address]  Troponin I (q 6hr x 3)     Status: None   Collection Time: 08/29/17 10:17 PM  Result Value Ref Range   Troponin I <0.03 <0.03 ng/mL    Comment: Performed at [facility] [address], [place], [place] [address]  Urinalysis, Routine w reflex microscopic     Status: Abnormal   Collection Time: 08/29/17 11:21 PM  Result Value Ref Range   Color, Urine AMBER (A) YELLOW    Comment: BIOCHEMICALS MAY BE AFFECTED BY COLOR   APPearance HAZY (A) CLEAR   Specific Gravity, Urine 1.014 1.005 - 1.030   pH 5.0 5.0 - 8.0   Glucose, UA NEGATIVE NEGATIVE mg/dL   Hgb urine dipstick NEGATIVE NEGATIVE   Bilirubin Urine NEGATIVE NEGATIVE   Ketones, ur NEGATIVE NEGATIVE mg/dL   Protein, ur NEGATIVE NEGATIVE mg/dL   Nitrite POSITIVE (A) NEGATIVE   Leukocytes, UA NEGATIVE NEGATIVE   RBC / HPF 0-5 0 - 5 RBC/hpf   WBC, UA 0-5 0 - 5 WBC/hpf   Bacteria, UA RARE (A) NONE SEEN   Mucus PRESENT     Comment: Performed at [facility], [address] [place], [place] [address]  MRSA PCR Screening     Status: None   Collection Time: 08/29/17 11:29 PM  Result Value Ref Range   MRSA by PCR NEGATIVE NEGATIVE    Comment:        The GeneXpert MRSA Assay (FDA approved for NASAL specimens only), is one component of a comprehensive MRSA colonization surveillance program. It is not intended to diagnose MRSA infection nor to guide or monitor treatment  for MRSA infections. Performed at [facility], [place] [address] [place] [address], [place], [place] [address]   Glucose, capillary     Status: Abnormal   Collection Time: 08/29/17 11:30 PM  Result Value Ref Range   Glucose-Capillary 124 (H) 70 - 99 mg/dL   Comment 1 Notify RN   Cortisol     Status: None   Collection Time: 08/30/17 12:34 AM  Result Value Ref Range   Cortisol, Plasma 21.3 ug/dL    Comment: (NOTE) AM    6.7 - 22.6 ug/dL PM   <10.0       ug/dL Performed at [facility] [address], [place], [place] [address]   Sedimentation rate     Status: None   Collection Time: 08/30/17 12:34 AM  Result Value Ref Range  Sed Rate 12 0 - 22 mm/hr    Comment: Performed at [facility], [place] [address] [place], [place] [address]  Magnesium     Status: None   Collection Time: 08/30/17 12:34 AM  Result Value Ref Range   Magnesium 2.0 1.7 - 2.4 mg/dL    Comment: Performed at [facility] [address] [place], [place] [address]  Lactic acid, plasma     Status: None   Collection Time: 08/30/17 12:34 AM  Result Value Ref Range   Lactic Acid, Venous 1.7 0.5 - 1.9 mmol/L    Comment: Performed at [facility] [address], [place], [place] [address]  Troponin I (q 6hr x 3)     Status: Abnormal   Collection Time: 08/30/17  5:21 AM  Result Value Ref Range   Troponin I 0.04 (HH) <0.03 ng/mL    Comment: CRITICAL RESULT CALLED TO, READ BACK BY AND VERIFIED WITH: [facility] RN 08/30/2017 7893 JORDANS Performed at [facility], [place] [address] [place] [address], [place], [place] [address]   CBC     Status: Abnormal   Collection Time: 08/30/17  5:21 AM  Result Value Ref Range   WBC 10.1 4.0 - 10.5 K/uL   RBC 4.26 3.87 - 5.11 MIL/uL   Hemoglobin 12.2 12.0 - 15.0 g/dL   HCT 39.6 36.0 - 46.0 %   MCV 93.0 78.0 - 100.0 fL   MCH 28.6 26.0 - 34.0 pg   MCHC 30.8 30.0 - 36.0 g/dL   RDW 15.1 11.5 - 15.5 %   Platelets 131 (L) 150 - 400 K/uL    Comment: Performed at [facility], [place] [address] [place], [place] [address]  Renal function panel     Status: Abnormal   Collection Time: 08/30/17  5:21 AM  Result Value Ref Range   Sodium 139 135 - 145 mmol/L   Potassium 3.8 3.5 - 5.1 mmol/L   Chloride 106 98 - 111 mmol/L   CO2 22 22 - 32 mmol/L   Glucose, Bld 130 (H) 70 - 99 mg/dL   BUN 33 (H) 8 - 23 mg/dL   Creatinine, Ser 2.24 (H) 0.44 - 1.00 mg/dL   Calcium 7.6 (L) 8.9 - 10.3 mg/dL   Phosphorus 3.1 2.5 - 4.6 mg/dL   Albumin 2.6 (L) 3.5 - 5.0 g/dL   GFR calc non Af Amer 20 (L) >60 mL/min   GFR calc Af Amer 23 (L) >60 mL/min    Comment: (NOTE) The eGFR has been calculated using the CKD EPI equation. This calculation has not been validated in all clinical situations. eGFR's persistently <60 mL/min signify possible Chronic Kidney Disease.    Anion gap 11 5 - 15    Comment: Performed at [facility] [address], [place], [place] [address]  Troponin I (q 6hr x 3)     Status: Abnormal   Collection Time: 08/30/17 11:09 AM  Result Value Ref Range   Troponin I 0.04 (HH) <0.03 ng/mL    Comment: CRITICAL VALUE NOTED.  VALUE IS CONSISTENT WITH PREVIOUSLY REPORTED AND CALLED VALUE. Performed at [facility], [place] [address] [place], [place] [address]   Procalcitonin - Baseline     Status: None   Collection Time: 08/30/17 11:09 AM  Result Value Ref Range   Procalcitonin 1.46 ng/mL    Comment:        Interpretation: PCT > 0.5 ng/mL and <= 2 ng/mL: Systemic  infection (sepsis) is possible, but other conditions are known to elevate PCT as well. (NOTE)       Sepsis PCT Algorithm           Lower Respiratory Tract                                      Infection PCT Algorithm    ----------------------------     ----------------------------         PCT < 0.25 ng/mL                PCT < 0.10 ng/mL         Strongly encourage             Strongly discourage   discontinuation of antibiotics    initiation of antibiotics    ----------------------------      -----------------------------       PCT 0.25 - 0.50 ng/mL            PCT 0.10 - 0.25 ng/mL               OR       >80% decrease in PCT            Discourage initiation of                                            antibiotics      Encourage discontinuation           of antibiotics    ----------------------------     -----------------------------         PCT >= 0.50 ng/mL              PCT 0.26 - 0.50 ng/mL                AND       <80% decrease in PCT             Encourage initiation of                                             antibiotics       Encourage continuation           of antibiotics    ----------------------------     -----------------------------        PCT >= 0.50 ng/mL                  PCT > 0.50 ng/mL               AND         increase in PCT                  Strongly encourage                                      initiation of antibiotics    Strongly encourage escalation           of antibiotics                                     -----------------------------  PCT <= 0.25 ng/mL                                                 OR                                        > 80% decrease in PCT                                     Discontinue / Do not initiate                                             antibiotics Performed at [facility], [place] [address], [place], [place] [address]   Strep pneumoniae urinary antigen     Status: None   Collection Time: 08/30/17  4:28 PM  Result Value Ref Range   Strep Pneumo Urinary Antigen NEGATIVE NEGATIVE    Comment:        Infection due to S. pneumoniae cannot be absolutely ruled out since the antigen present may be below the detection limit of the test. Performed at [facility], [place] [address], [place], [place] [address]   Basic metabolic panel     Status: Abnormal   Collection Time: 08/30/17  6:31 PM  Result Value Ref Range   Sodium 136 135 - 145 mmol/L   Potassium  3.6 3.5 - 5.1 mmol/L   Chloride 103 98 - 111 mmol/L   CO2 22 22 - 32 mmol/L   Glucose, Bld 136 (H) 70 - 99 mg/dL   BUN 33 (H) 8 - 23 mg/dL   Creatinine, Ser 2.07 (H) 0.44 - 1.00 mg/dL   Calcium 7.9 (L) 8.9 - 10.3 mg/dL   GFR calc non Af Amer 21 (L) >60 mL/min   GFR calc Af Amer 25 (L) >60 mL/min    Comment: (NOTE) The eGFR has been calculated using the CKD EPI equation. This calculation has not been validated in all clinical situations. eGFR's persistently <60 mL/min signify possible Chronic Kidney Disease.    Anion gap 11 5 - 15    Comment: Performed at [facility] [address], [place], [place] [address]  Troponin I     Status: Abnormal   Collection Time: 08/31/17 12:18 AM  Result Value Ref Range   Troponin I 0.40 (HH) <0.03 ng/mL    Comment: CRITICAL RESULT CALLED TO, READ BACK BY AND VERIFIED WITH: WOODARD C,RN 08/31/17 0148 WAYK Performed at [facility], [place] [address], [place], [place] [address]   Magnesium     Status: None   Collection Time: 08/31/17 12:18 AM  Result Value Ref Range   Magnesium 2.0 1.7 - 2.4 mg/dL    Comment: Performed at [facility] [address], [place], [place] [address]  Phosphorus     Status: None   Collection Time: 08/31/17 12:18 AM  Result Value Ref Range   Phosphorus 2.9 2.5 - 4.6 mg/dL    Comment: Performed at [facility]  [address], [place], [place] [address]  CBC with Differential/Platelet     Status: Abnormal   Collection Time: 08/31/17 12:18 AM  Result Value Ref Range   WBC 9.5 4.0 - 10.5 K/uL   RBC 4.46 3.87 - 5.11 MIL/uL   Hemoglobin 12.8 12.0 - 15.0 g/dL   HCT 41.0 36.0 - 46.0 %   MCV 91.9 78.0 - 100.0 fL   MCH 28.7 26.0 - 34.0 pg   MCHC 31.2 30.0 - 36.0 g/dL   RDW 15.0 11.5 - 15.5 %   Platelets 113 (L) 150 - 400 K/uL    Comment: REPEATED TO VERIFY SPECIMEN CHECKED FOR CLOTS PLATELET COUNT CONFIRMED BY SMEAR    Neutrophils Relative % 78 %   Neutro Abs 7.4 1.7 - 7.7 K/uL   Lymphocytes  Relative 12 %   Lymphs Abs 1.2 0.7 - 4.0 K/uL   Monocytes Relative 8 %   Monocytes Absolute 0.7 0.1 - 1.0 K/uL   Eosinophils Relative 1 %   Eosinophils Absolute 0.1 0.0 - 0.7 K/uL   Basophils Relative 0 %   Basophils Absolute 0.0 0.0 - 0.1 K/uL   Immature Granulocytes 1 %   Abs Immature Granulocytes 0.1 0.0 - 0.1 K/uL    Comment: Performed at [facility] [address] [place], [place] [address]  Procalcitonin     Status: None   Collection Time: 08/31/17 12:18 AM  Result Value Ref Range   Procalcitonin 1.18 ng/mL    Comment:        Interpretation: PCT > 0.5 ng/mL and <= 2 ng/mL: Systemic infection (sepsis) is possible, but other conditions are known to elevate PCT as well. (NOTE)       Sepsis PCT Algorithm           Lower Respiratory Tract                                      Infection PCT Algorithm    ----------------------------     ----------------------------         PCT < 0.25 ng/mL                PCT < 0.10 ng/mL         Strongly encourage             Strongly discourage   discontinuation of antibiotics    initiation of antibiotics    ----------------------------     -----------------------------       PCT 0.25 - 0.50 ng/mL            PCT 0.10 - 0.25 ng/mL               OR       >80% decrease in PCT            Discourage initiation of                                            antibiotics      Encourage discontinuation           of antibiotics    ----------------------------     -----------------------------         PCT >= 0.50 ng/mL              PCT 0.26 - 0.50  ng/mL                AND       <80% decrease in PCT             Encourage initiation of                                             antibiotics       Encourage continuation           of antibiotics    ----------------------------     -----------------------------        PCT >= 0.50 ng/mL                  PCT > 0.50 ng/mL               AND         increase in PCT                  Strongly  encourage                                      initiation of antibiotics    Strongly encourage escalation           of antibiotics                                     -----------------------------                                           PCT <= 0.25 ng/mL                                                 OR                                        > 80% decrease in PCT                                     Discontinue / Do not initiate                                             antibiotics Performed at [facility], [address], [place], [place] [address]   Basic metabolic panel     Status: Abnormal   Collection Time: 08/31/17 12:18 AM  Result Value Ref Range   Sodium 137 135 - 145 mmol/L   Potassium 3.5 3.5 - 5.1 mmol/L   Chloride 103 98 - 111 mmol/L   CO2 19 (L) 22 - 32 mmol/L   Glucose, Bld 119 (H) 70 - 99 mg/dL   BUN 33 (H) 8 -  23 mg/dL   Creatinine, Ser 1.92 (H) 0.44 - 1.00 mg/dL   Calcium 8.0 (L) 8.9 - 10.3 mg/dL   GFR calc non Af Amer 24 (L) >60 mL/min   GFR calc Af Amer 27 (L) >60 mL/min    Comment: (NOTE) The eGFR has been calculated using the CKD EPI equation. This calculation has not been validated in all clinical situations. eGFR's persistently <60 mL/min signify possible Chronic Kidney Disease.    Anion gap 15 5 - 15    Comment: Performed at [facility] [address] [place], [place] [address]    ECG   N/A  Telemetry   Afib with CVR - Personally Reviewed  Radiology    Dg Chest 2 View  Result Date: 08/29/2017 CLINICAL DATA:  Chest pain and shortness of breath atrial fibrillation. EXAM: CHEST - 2 VIEW COMPARISON:  08/04/2017 and 08/01/2017 and 02/19/2016 FINDINGS: Chronic cardiomegaly.  Aortic atherosclerosis. Pulmonary vascularity is normal. No infiltrates or effusions. Slight chronic accentuation of the thoracic kyphosis. IMPRESSION: Chronic cardiomegaly.  No acute abnormalities. Aortic Atherosclerosis (ICD10-I70.0). Electronically Signed    By: Lorriane Shire M.D.   On: 08/29/2017 12:08   US Renal  Result Date: 08/30/2017 CLINICAL DATA:  Initial evaluation for acute renal injury. EXAM: RENAL / URINARY TRACT ULTRASOUND COMPLETE COMPARISON:  None. FINDINGS: Right Kidney: Length: 11.4 cm. Echogenicity within normal limits. No hydronephrosis. 1.3 x 1.0 x 1.2 cm simple cyst present at the lower pole. Left Kidney: Length: 11.6 cm. Echogenicity within normal limits. No mass or hydronephrosis visualized. Bladder: Decompressed with a Foley catheter in place. IMPRESSION: 1. No hydronephrosis. 2. 1.3 cm simple right renal cyst. 3. Foley catheter in place. Electronically Signed   By: Jeannine Boga M.D.   On: 08/30/2017 14:25   Dg Chest Port 1 View  Result Date: 08/30/2017 CLINICAL DATA:  Shortness of breath beginning today. EXAM: PORTABLE CHEST 1 VIEW COMPARISON:  08/29/2017.  08/04/2017. FINDINGS: Chronic cardiomegaly and aortic atherosclerosis. Mild chronic volume loss at the right lung base. Worsened appearance of the left lung base with increased volume loss and/or pneumonia. Probable associated effusion. Upper lungs are clear. IMPRESSION: Chronic cardiomegaly and aortic atherosclerosis. Worsening of abnormal density at the left lung base consistent with atelectasis and or pneumonia, probably with an associated effusion. Mild chronic volume loss at the right base. Electronically Signed   By: Nelson Chimes M.D.   On: 08/30/2017 08:31    Cardiac Studies   N/A  Assessment   1. Principal Problem: 2.   Sepsis ([place]) 3. Active Problems: 4.   Hyperlipidemia 5.   Essential hypertension 6.   Chronic renal insufficiency, stage II (mild) 7.   Bronchopneumonia 8.   PAF (paroxysmal atrial fibrillation) ([place]) 9.   Chronic anticoagulation 10.   Chest pain 11.   DOE (dyspnea on exertion) 12.   Arterial hypotension 13.   Acute-on-chronic kidney injury ([place]) 14.   Rash in adult 15.   Atrial fibrillation and flutter ([place]) 16.   Plan    1. Hemodynamics are improved - noted to have jump in troponin overnight to 0.4 (was 0.04) - this is concerning in the setting of hypotension and chest pain for NSTEMI or possible global subendocardial ischemia. Will repeat troponin today. Hold Xarelto (last dose ~5pm on 8/14) - would recommend pursuing LHC tomorrow afternoon to allow Xarelto washout. Switch to IV heparin. Monitor platelets, which are lower today, however, hemoglobin stable and no S/S of bleeding. Repeat CXR tomorrow - ?if  there was a pneumonia leading to infective symptoms and fever. Start aspirin 81 mg daily. Keep NPO p MN tonight.  Time Spent Directly with Patient:  I have spent a total of 25 minutes with the patient reviewing hospital notes, telemetry, EKGs, labs and examining the patient as well as establishing an assessment and plan that was discussed personally with the patient.  > 50% of time was spent in direct patient care.  Length of Stay:  LOS: 1 day   [name], MD, [facility], [place] Director of the [facility]  [facility] Diplomate of the American Board of Clinical Lipidology Attending Cardiologist  Direct Dial: [phone]  Fax: [phone]  Website:  [email or web address] 08/31/2017, 11:11 AM

## 2017-08-31 NOTE — H&P (View-Only) (Signed)
DAILY PROGRESS NOTE   Patient Name: Brenda Matthews Date of Encounter: 08/31/2017  Chief Complaint   No further chest pain  Patient Profile   Brenda Matthews is a 82 y.o. female with a hx that includes PAF on Xarelto, chronic diastolic CHF, diverticulosis, NASH, HTN, HLD, hypothyroidism who is being seen today for the evaluation of chest pain at the request of Dr. Debara Pickett.  Subjective   Blood pressure has now normalized. Remains in a-fib with RVR. Labs however, have shown a new troponin elevation to 0.4 (from 0.04) overnight. Creatinine is improving (1.92 today, from 2.24). Platelets lower today at 113 (from 131).   Objective   Vitals:   08/31/17 0800 08/31/17 0900 08/31/17 0908 08/31/17 1000  BP: 120/64 115/71 115/71 108/68  Pulse: (!) 113 (!) 121 (!) 130 (!) 103  Resp: (!) 33 (!) 31  (!) 28  Temp:      TempSrc:      SpO2: 92% 94%  92%  Weight:      Height:        Intake/Output Summary (Last 24 hours) at 08/31/2017 1111 Last data filed at 08/31/2017 1000 Gross per 24 hour  Intake 609.3 ml  Output 1480 ml  Net -870.7 ml   Filed Weights   08/29/17 1132 08/29/17 1600 08/29/17 2330  Weight: 75.8 kg 78.1 kg 77.4 kg    Physical Exam   General appearance: alert and no distress Lungs: clear to auscultation bilaterally Heart: irregularly irregular rhythm Extremities: extremities normal, atraumatic, no cyanosis or edema Neurologic: Grossly normal  Inpatient Medications    Scheduled Meds: . amiodarone  200 mg Oral BID  . atorvastatin  40 mg Oral q1800  . fluticasone  2 spray Each Nare Daily  . ipratropium-albuterol  3 mL Nebulization BID  . mouth rinse  15 mL Mouth Rinse BID  . metoprolol tartrate  25 mg Oral BID  . rivaroxaban  15 mg Oral Q supper    Continuous Infusions: . cefTRIAXone (ROCEPHIN)  IV 1 g (08/31/17 0917)    PRN Meds: acetaminophen, albuterol, benzonatate, gi cocktail, ondansetron (ZOFRAN) IV   Labs   Results for orders placed or performed during  the hospital encounter of 08/29/17 (from the past 48 hour(s))  Basic metabolic panel     Status: Abnormal   Collection Time: 08/29/17 11:45 AM  Result Value Ref Range   Sodium 142 135 - 145 mmol/L   Potassium 3.6 3.5 - 5.1 mmol/L   Chloride 106 98 - 111 mmol/L   CO2 24 22 - 32 mmol/L   Glucose, Bld 110 (H) 70 - 99 mg/dL   BUN 27 (H) 8 - 23 mg/dL   Creatinine, Ser 1.57 (H) 0.44 - 1.00 mg/dL   Calcium 8.4 (L) 8.9 - 10.3 mg/dL   GFR calc non Af Amer 30 (L) >60 mL/min   GFR calc Af Amer 35 (L) >60 mL/min    Comment: (NOTE) The eGFR has been calculated using the CKD EPI equation. This calculation has not been validated in all clinical situations. eGFR's persistently <60 mL/min signify possible Chronic Kidney Disease.    Anion gap 12 5 - 15    Comment: Performed at [facility] [address] [place] [address]  CBC     Status: Abnormal   Collection Time: 08/29/17 11:45 AM  Result Value Ref Range   WBC 8.2 4.0 - 10.5 K/uL   RBC 5.04 3.87 - 5.11 MIL/uL   Hemoglobin 14.6 12.0 -  15.0 g/dL   HCT 46.8 (H) 36.0 - 46.0 %   MCV 92.9 78.0 - 100.0 fL   MCH 29.0 26.0 - 34.0 pg   MCHC 31.2 30.0 - 36.0 g/dL   RDW 14.7 11.5 - 15.5 %   Platelets 144 (L) 150 - 400 K/uL    Comment: Performed at [facility] [address], [place], [place] [address]  Protime-INR (order if Patient is taking Coumadin / Warfarin)     Status: Abnormal   Collection Time: 08/29/17 11:45 AM  Result Value Ref Range   Prothrombin Time 19.3 (H) 11.4 - 15.2 seconds   INR 1.65     Comment: Performed at [facility] [address], [place], [place] [address]  I-stat troponin, ED     Status: None   Collection Time: 08/29/17 11:51 AM  Result Value Ref Range   Troponin i, poc 0.00 0.00 - 0.08 ng/mL   Comment 3            Comment: Due to the release kinetics of cTnI, a negative result within the first hours of the onset of symptoms does not rule out myocardial infarction with certainty. If  myocardial infarction is still suspected, repeat the test at appropriate intervals.   I-stat troponin, ED     Status: None   Collection Time: 08/29/17  2:33 PM  Result Value Ref Range   Troponin i, poc 0.01 0.00 - 0.08 ng/mL   Comment 3            Comment: Due to the release kinetics of cTnI, a negative result within the first hours of the onset of symptoms does not rule out myocardial infarction with certainty. If myocardial infarction is still suspected, repeat the test at appropriate intervals.   I-Stat CG4 Lactic Acid, ED     Status: None   Collection Time: 08/29/17  2:36 PM  Result Value Ref Range   Lactic Acid, Venous 0.66 0.5 - 1.9 mmol/L  Troponin I-serum (one time only)     Status: None   Collection Time: 08/29/17  2:58 PM  Result Value Ref Range   Troponin I <0.03 <0.03 ng/mL    Comment: Performed at [facility], [place] [address] [place], [place] [address]  Procalcitonin - Baseline     Status: None   Collection Time: 08/29/17  2:58 PM  Result Value Ref Range   Procalcitonin 0.21 ng/mL    Comment:        Interpretation: PCT (Procalcitonin) <= 0.5 ng/mL: Systemic infection (sepsis) is not likely. Local bacterial infection is possible. (NOTE)       Sepsis PCT Algorithm           Lower Respiratory Tract                                      Infection PCT Algorithm    ----------------------------     ----------------------------         PCT < 0.25 ng/mL                PCT < 0.10 ng/mL         Strongly encourage             Strongly discourage   discontinuation of antibiotics    initiation of antibiotics    ----------------------------     -----------------------------  PCT 0.25 - 0.50 ng/mL            PCT 0.10 - 0.25 ng/mL               OR       >80% decrease in PCT            Discourage initiation of                                            antibiotics      Encourage discontinuation           of antibiotics    ----------------------------      -----------------------------         PCT >= 0.50 ng/mL              PCT 0.26 - 0.50 ng/mL               AND        <80% decrease in PCT             Encourage initiation of                                             antibiotics       Encourage continuation           of antibiotics    ----------------------------     -----------------------------        PCT >= 0.50 ng/mL                  PCT > 0.50 ng/mL               AND         increase in PCT                  Strongly encourage                                      initiation of antibiotics    Strongly encourage escalation           of antibiotics                                     -----------------------------                                           PCT <= 0.25 ng/mL                                                 OR                                        > 80% decrease in PCT  Discontinue / Do not initiate                                             antibiotics Performed at [facility], [place] [address] [place], [place] [address]   Respiratory Panel by PCR     Status: None   Collection Time: 08/29/17  4:30 PM  Result Value Ref Range   Adenovirus NOT DETECTED NOT DETECTED   Coronavirus 229E NOT DETECTED NOT DETECTED   Coronavirus HKU1 NOT DETECTED NOT DETECTED   Coronavirus NL63 NOT DETECTED NOT DETECTED   Coronavirus OC43 NOT DETECTED NOT DETECTED   Metapneumovirus NOT DETECTED NOT DETECTED   Rhinovirus / Enterovirus NOT DETECTED NOT DETECTED   Influenza A NOT DETECTED NOT DETECTED   Influenza B NOT DETECTED NOT DETECTED   Parainfluenza Virus 1 NOT DETECTED NOT DETECTED   Parainfluenza Virus 2 NOT DETECTED NOT DETECTED   Parainfluenza Virus 3 NOT DETECTED NOT DETECTED   Parainfluenza Virus 4 NOT DETECTED NOT DETECTED   Respiratory Syncytial Virus NOT DETECTED NOT DETECTED   Bordetella pertussis NOT DETECTED NOT DETECTED   Chlamydophila pneumoniae NOT DETECTED NOT DETECTED    Mycoplasma pneumoniae NOT DETECTED NOT DETECTED    Comment: Performed at [facility] [address], [place], [place] [address]  Comprehensive metabolic panel     Status: Abnormal   Collection Time: 08/29/17 10:10 PM  Result Value Ref Range   Sodium 136 135 - 145 mmol/L   Potassium 3.4 (L) 3.5 - 5.1 mmol/L   Chloride 106 98 - 111 mmol/L   CO2 21 (L) 22 - 32 mmol/L   Glucose, Bld 155 (H) 70 - 99 mg/dL   BUN 28 (H) 8 - 23 mg/dL   Creatinine, Ser 1.91 (H) 0.44 - 1.00 mg/dL   Calcium 7.4 (L) 8.9 - 10.3 mg/dL   Total Protein 5.3 (L) 6.5 - 8.1 g/dL   Albumin 2.6 (L) 3.5 - 5.0 g/dL   AST 20 15 - 41 U/L   ALT 26 0 - 44 U/L   Alkaline Phosphatase 53 38 - 126 U/L   Total Bilirubin 1.0 0.3 - 1.2 mg/dL   GFR calc non Af Amer 24 (L) >60 mL/min   GFR calc Af Amer 27 (L) >60 mL/min    Comment: (NOTE) The eGFR has been calculated using the CKD EPI equation. This calculation has not been validated in all clinical situations. eGFR's persistently <60 mL/min signify possible Chronic Kidney Disease.    Anion gap 9 5 - 15    Comment: Performed at [facility] [address] [place], [place] [address]  CBC with Differential/Platelet     Status: Abnormal   Collection Time: 08/29/17 10:10 PM  Result Value Ref Range   WBC 10.4 4.0 - 10.5 K/uL   RBC 4.27 3.87 - 5.11 MIL/uL   Hemoglobin 12.3 12.0 - 15.0 g/dL   HCT 39.2 36.0 - 46.0 %   MCV 91.8 78.0 - 100.0 fL   MCH 28.8 26.0 - 34.0 pg   MCHC 31.4 30.0 - 36.0 g/dL   RDW 15.0 11.5 - 15.5 %   Platelets 121 (L) 150 - 400 K/uL   Neutrophils Relative % 82 %   Neutro Abs 8.6 (H) 1.7 - 7.7 K/uL   Lymphocytes Relative 8 %   Lymphs Abs 0.8 0.7 - 4.0  K/uL   Monocytes Relative 9 %   Monocytes Absolute 0.9 0.1 - 1.0 K/uL   Eosinophils Relative 0 %   Eosinophils Absolute 0.0 0.0 - 0.7 K/uL   Basophils Relative 0 %   Basophils Absolute 0.0 0.0 - 0.1 K/uL   Immature Granulocytes 1 %   Abs Immature Granulocytes 0.1 0.0 - 0.1 K/uL    Comment:  Performed at [facility] [address] [place], [place] [address]  Brain natriuretic peptide     Status: Abnormal   Collection Time: 08/29/17 10:10 PM  Result Value Ref Range   B Natriuretic Peptide 1,028.9 (H) 0.0 - 100.0 pg/mL    Comment: Performed at [facility] [address], [place], [place] [address]  Lactic acid, plasma     Status: None   Collection Time: 08/29/17 10:10 PM  Result Value Ref Range   Lactic Acid, Venous 1.2 0.5 - 1.9 mmol/L    Comment: Performed at [facility] [address], [place], [place] [address]  Troponin I (q 6hr x 3)     Status: None   Collection Time: 08/29/17 10:17 PM  Result Value Ref Range   Troponin I <0.03 <0.03 ng/mL    Comment: Performed at [facility] [address], [place], [place] [address]  Urinalysis, Routine w reflex microscopic     Status: Abnormal   Collection Time: 08/29/17 11:21 PM  Result Value Ref Range   Color, Urine AMBER (A) YELLOW    Comment: BIOCHEMICALS MAY BE AFFECTED BY COLOR   APPearance HAZY (A) CLEAR   Specific Gravity, Urine 1.014 1.005 - 1.030   pH 5.0 5.0 - 8.0   Glucose, UA NEGATIVE NEGATIVE mg/dL   Hgb urine dipstick NEGATIVE NEGATIVE   Bilirubin Urine NEGATIVE NEGATIVE   Ketones, ur NEGATIVE NEGATIVE mg/dL   Protein, ur NEGATIVE NEGATIVE mg/dL   Nitrite POSITIVE (A) NEGATIVE   Leukocytes, UA NEGATIVE NEGATIVE   RBC / HPF 0-5 0 - 5 RBC/hpf   WBC, UA 0-5 0 - 5 WBC/hpf   Bacteria, UA RARE (A) NONE SEEN   Mucus PRESENT     Comment: Performed at [facility], [address], [place], [place] [address]  MRSA PCR Screening     Status: None   Collection Time: 08/29/17 11:29 PM  Result Value Ref Range   MRSA by PCR NEGATIVE NEGATIVE    Comment:        The GeneXpert MRSA Assay (FDA approved for NASAL specimens only), is one component of a comprehensive MRSA colonization surveillance program. It is not intended to diagnose MRSA infection nor to guide or monitor treatment  for MRSA infections. Performed at [facility], [place] [address], [place], [place] [address]   Glucose, capillary     Status: Abnormal   Collection Time: 08/29/17 11:30 PM  Result Value Ref Range   Glucose-Capillary 124 (H) 70 - 99 mg/dL   Comment 1 Notify RN   Cortisol     Status: None   Collection Time: 08/30/17 12:34 AM  Result Value Ref Range   Cortisol, Plasma 21.3 ug/dL    Comment: (NOTE) AM    6.7 - 22.6 ug/dL PM   <10.0       ug/dL Performed at [facility] [address], [place], [place] [address]   Sedimentation rate     Status: None   Collection Time: 08/30/17 12:34 AM  Result Value Ref Range  Sed Rate 12 0 - 22 mm/hr    Comment: Performed at [facility], [place] [address], [place], [place] [address]  Magnesium     Status: None   Collection Time: 08/30/17 12:34 AM  Result Value Ref Range   Magnesium 2.0 1.7 - 2.4 mg/dL    Comment: Performed at [facility] [address] [place], [place] [address]  Lactic acid, plasma     Status: None   Collection Time: 08/30/17 12:34 AM  Result Value Ref Range   Lactic Acid, Venous 1.7 0.5 - 1.9 mmol/L    Comment: Performed at [facility] [address] [place], [place] [address]  Troponin I (q 6hr x 3)     Status: Abnormal   Collection Time: 08/30/17  5:21 AM  Result Value Ref Range   Troponin I 0.04 (HH) <0.03 ng/mL    Comment: CRITICAL RESULT CALLED TO, READ BACK BY AND VERIFIED WITH: [facility] RN 08/30/2017 0932 JORDANS Performed at [facility], [place] [address], [place], [place] [address]   CBC     Status: Abnormal   Collection Time: 08/30/17  5:21 AM  Result Value Ref Range   WBC 10.1 4.0 - 10.5 K/uL   RBC 4.26 3.87 - 5.11 MIL/uL   Hemoglobin 12.2 12.0 - 15.0 g/dL   HCT 39.6 36.0 - 46.0 %   MCV 93.0 78.0 - 100.0 fL   MCH 28.6 26.0 - 34.0 pg   MCHC 30.8 30.0 - 36.0 g/dL   RDW 15.1 11.5 - 15.5 %   Platelets 131 (L) 150 - 400 K/uL    Comment: Performed at [facility], [place] [address], [place], [place] [address]  Renal function panel     Status: Abnormal   Collection Time: 08/30/17  5:21 AM  Result Value Ref Range   Sodium 139 135 - 145 mmol/L   Potassium 3.8 3.5 - 5.1 mmol/L   Chloride 106 98 - 111 mmol/L   CO2 22 22 - 32 mmol/L   Glucose, Bld 130 (H) 70 - 99 mg/dL   BUN 33 (H) 8 - 23 mg/dL   Creatinine, Ser 2.24 (H) 0.44 - 1.00 mg/dL   Calcium 7.6 (L) 8.9 - 10.3 mg/dL   Phosphorus 3.1 2.5 - 4.6 mg/dL   Albumin 2.6 (L) 3.5 - 5.0 g/dL   GFR calc non Af Amer 20 (L) >60 mL/min   GFR calc Af Amer 23 (L) >60 mL/min    Comment: (NOTE) The eGFR has been calculated using the CKD EPI equation. This calculation has not been validated in all clinical situations. eGFR's persistently <60 mL/min signify possible Chronic Kidney Disease.    Anion gap 11 5 - 15    Comment: Performed at [facility] [address] [place], [place] [address]  Troponin I (q 6hr x 3)     Status: Abnormal   Collection Time: 08/30/17 11:09 AM  Result Value Ref Range   Troponin I 0.04 (HH) <0.03 ng/mL    Comment: CRITICAL VALUE NOTED.  VALUE IS CONSISTENT WITH PREVIOUSLY REPORTED AND CALLED VALUE. Performed at [facility], [place] [address], [place], [place] [address]   Procalcitonin - Baseline     Status: None   Collection Time: 08/30/17 11:09 AM  Result Value Ref Range   Procalcitonin 1.46 ng/mL    Comment:        Interpretation: PCT > 0.5 ng/mL and <= 2 ng/mL: Systemic  infection (sepsis) is possible, but other conditions are known to elevate PCT as well. (NOTE)       Sepsis PCT Algorithm           Lower Respiratory Tract                                      Infection PCT Algorithm    ----------------------------     ----------------------------         PCT < 0.25 ng/mL                PCT < 0.10 ng/mL         Strongly encourage             Strongly discourage   discontinuation of antibiotics    initiation of antibiotics    ----------------------------      -----------------------------       PCT 0.25 - 0.50 ng/mL            PCT 0.10 - 0.25 ng/mL               OR       >80% decrease in PCT            Discourage initiation of                                            antibiotics      Encourage discontinuation           of antibiotics    ----------------------------     -----------------------------         PCT >= 0.50 ng/mL              PCT 0.26 - 0.50 ng/mL                AND       <80% decrease in PCT             Encourage initiation of                                             antibiotics       Encourage continuation           of antibiotics    ----------------------------     -----------------------------        PCT >= 0.50 ng/mL                  PCT > 0.50 ng/mL               AND         increase in PCT                  Strongly encourage                                      initiation of antibiotics    Strongly encourage escalation           of antibiotics                                     -----------------------------  PCT <= 0.25 ng/mL                                                 OR                                        > 80% decrease in PCT                                     Discontinue / Do not initiate                                             antibiotics Performed at [facility], [place] [address] [place], [place] [address]   Strep pneumoniae urinary antigen     Status: None   Collection Time: 08/30/17  4:28 PM  Result Value Ref Range   Strep Pneumo Urinary Antigen NEGATIVE NEGATIVE    Comment:        Infection due to S. pneumoniae cannot be absolutely ruled out since the antigen present may be below the detection limit of the test. Performed at [facility], [place] [address] [place], [place] [address]   Basic metabolic panel     Status: Abnormal   Collection Time: 08/30/17  6:31 PM  Result Value Ref Range   Sodium 136 135 - 145 mmol/L   Potassium  3.6 3.5 - 5.1 mmol/L   Chloride 103 98 - 111 mmol/L   CO2 22 22 - 32 mmol/L   Glucose, Bld 136 (H) 70 - 99 mg/dL   BUN 33 (H) 8 - 23 mg/dL   Creatinine, Ser 2.07 (H) 0.44 - 1.00 mg/dL   Calcium 7.9 (L) 8.9 - 10.3 mg/dL   GFR calc non Af Amer 21 (L) >60 mL/min   GFR calc Af Amer 25 (L) >60 mL/min    Comment: (NOTE) The eGFR has been calculated using the CKD EPI equation. This calculation has not been validated in all clinical situations. eGFR's persistently <60 mL/min signify possible Chronic Kidney Disease.    Anion gap 11 5 - 15    Comment: Performed at [facility] [address], [place], [place] [address]  Troponin I     Status: Abnormal   Collection Time: 08/31/17 12:18 AM  Result Value Ref Range   Troponin I 0.40 (HH) <0.03 ng/mL    Comment: CRITICAL RESULT CALLED TO, READ BACK BY AND VERIFIED WITH: WOODARD C,RN 08/31/17 0148 WAYK Performed at [facility], [place] [address], [place], [place] [address]   Magnesium     Status: None   Collection Time: 08/31/17 12:18 AM  Result Value Ref Range   Magnesium 2.0 1.7 - 2.4 mg/dL    Comment: Performed at [facility] [address], [place], [place] [address]  Phosphorus     Status: None   Collection Time: 08/31/17 12:18 AM  Result Value Ref Range   Phosphorus 2.9 2.5 - 4.6 mg/dL    Comment: Performed at [facility]  [address], [place], [place] [address]  CBC with Differential/Platelet     Status: Abnormal   Collection Time: 08/31/17 12:18 AM  Result Value Ref Range   WBC 9.5 4.0 - 10.5 K/uL   RBC 4.46 3.87 - 5.11 MIL/uL   Hemoglobin 12.8 12.0 - 15.0 g/dL   HCT 41.0 36.0 - 46.0 %   MCV 91.9 78.0 - 100.0 fL   MCH 28.7 26.0 - 34.0 pg   MCHC 31.2 30.0 - 36.0 g/dL   RDW 15.0 11.5 - 15.5 %   Platelets 113 (L) 150 - 400 K/uL    Comment: REPEATED TO VERIFY SPECIMEN CHECKED FOR CLOTS PLATELET COUNT CONFIRMED BY SMEAR    Neutrophils Relative % 78 %   Neutro Abs 7.4 1.7 - 7.7 K/uL   Lymphocytes  Relative 12 %   Lymphs Abs 1.2 0.7 - 4.0 K/uL   Monocytes Relative 8 %   Monocytes Absolute 0.7 0.1 - 1.0 K/uL   Eosinophils Relative 1 %   Eosinophils Absolute 0.1 0.0 - 0.7 K/uL   Basophils Relative 0 %   Basophils Absolute 0.0 0.0 - 0.1 K/uL   Immature Granulocytes 1 %   Abs Immature Granulocytes 0.1 0.0 - 0.1 K/uL    Comment: Performed at [facility] [address], [place], [place] [address]  Procalcitonin     Status: None   Collection Time: 08/31/17 12:18 AM  Result Value Ref Range   Procalcitonin 1.18 ng/mL    Comment:        Interpretation: PCT > 0.5 ng/mL and <= 2 ng/mL: Systemic infection (sepsis) is possible, but other conditions are known to elevate PCT as well. (NOTE)       Sepsis PCT Algorithm           Lower Respiratory Tract                                      Infection PCT Algorithm    ----------------------------     ----------------------------         PCT < 0.25 ng/mL                PCT < 0.10 ng/mL         Strongly encourage             Strongly discourage   discontinuation of antibiotics    initiation of antibiotics    ----------------------------     -----------------------------       PCT 0.25 - 0.50 ng/mL            PCT 0.10 - 0.25 ng/mL               OR       >80% decrease in PCT            Discourage initiation of                                            antibiotics      Encourage discontinuation           of antibiotics    ----------------------------     -----------------------------         PCT >= 0.50 ng/mL              PCT 0.26 - 0.50  ng/mL                AND       <80% decrease in PCT             Encourage initiation of                                             antibiotics       Encourage continuation           of antibiotics    ----------------------------     -----------------------------        PCT >= 0.50 ng/mL                  PCT > 0.50 ng/mL               AND         increase in PCT                  Strongly  encourage                                      initiation of antibiotics    Strongly encourage escalation           of antibiotics                                     -----------------------------                                           PCT <= 0.25 ng/mL                                                 OR                                        > 80% decrease in PCT                                     Discontinue / Do not initiate                                             antibiotics Performed at [facility], [address], [place], [place] [address]   Basic metabolic panel     Status: Abnormal   Collection Time: 08/31/17 12:18 AM  Result Value Ref Range   Sodium 137 135 - 145 mmol/L   Potassium 3.5 3.5 - 5.1 mmol/L   Chloride 103 98 - 111 mmol/L   CO2 19 (L) 22 - 32 mmol/L   Glucose, Bld 119 (H) 70 - 99 mg/dL   BUN 33 (H) 8 -  23 mg/dL   Creatinine, Ser 1.92 (H) 0.44 - 1.00 mg/dL   Calcium 8.0 (L) 8.9 - 10.3 mg/dL   GFR calc non Af Amer 24 (L) >60 mL/min   GFR calc Af Amer 27 (L) >60 mL/min    Comment: (NOTE) The eGFR has been calculated using the CKD EPI equation. This calculation has not been validated in all clinical situations. eGFR's persistently <60 mL/min signify possible Chronic Kidney Disease.    Anion gap 15 5 - 15    Comment: Performed at [facility] [address], [place], [place] [address]    ECG   N/A  Telemetry   Afib with CVR - Personally Reviewed  Radiology    Dg Chest 2 View  Result Date: 08/29/2017 CLINICAL DATA:  Chest pain and shortness of breath atrial fibrillation. EXAM: CHEST - 2 VIEW COMPARISON:  08/04/2017 and 08/01/2017 and 02/19/2016 FINDINGS: Chronic cardiomegaly.  Aortic atherosclerosis. Pulmonary vascularity is normal. No infiltrates or effusions. Slight chronic accentuation of the thoracic kyphosis. IMPRESSION: Chronic cardiomegaly.  No acute abnormalities. Aortic Atherosclerosis (ICD10-I70.0). Electronically Signed    By: Lorriane Shire M.D.   On: 08/29/2017 12:08   US Renal  Result Date: 08/30/2017 CLINICAL DATA:  Initial evaluation for acute renal injury. EXAM: RENAL / URINARY TRACT ULTRASOUND COMPLETE COMPARISON:  None. FINDINGS: Right Kidney: Length: 11.4 cm. Echogenicity within normal limits. No hydronephrosis. 1.3 x 1.0 x 1.2 cm simple cyst present at the lower pole. Left Kidney: Length: 11.6 cm. Echogenicity within normal limits. No mass or hydronephrosis visualized. Bladder: Decompressed with a Foley catheter in place. IMPRESSION: 1. No hydronephrosis. 2. 1.3 cm simple right renal cyst. 3. Foley catheter in place. Electronically Signed   By: Jeannine Boga M.D.   On: 08/30/2017 14:25   Dg Chest Port 1 View  Result Date: 08/30/2017 CLINICAL DATA:  Shortness of breath beginning today. EXAM: PORTABLE CHEST 1 VIEW COMPARISON:  08/29/2017.  08/04/2017. FINDINGS: Chronic cardiomegaly and aortic atherosclerosis. Mild chronic volume loss at the right lung base. Worsened appearance of the left lung base with increased volume loss and/or pneumonia. Probable associated effusion. Upper lungs are clear. IMPRESSION: Chronic cardiomegaly and aortic atherosclerosis. Worsening of abnormal density at the left lung base consistent with atelectasis and or pneumonia, probably with an associated effusion. Mild chronic volume loss at the right base. Electronically Signed   By: Nelson Chimes M.D.   On: 08/30/2017 08:31    Cardiac Studies   N/A  Assessment   1. Principal Problem: 2.   Sepsis ([place]) 3. Active Problems: 4.   Hyperlipidemia 5.   Essential hypertension 6.   Chronic renal insufficiency, stage II (mild) 7.   Bronchopneumonia 8.   PAF (paroxysmal atrial fibrillation) ([place]) 9.   Chronic anticoagulation 10.   Chest pain 11.   DOE (dyspnea on exertion) 12.   Arterial hypotension 13.   Acute-on-chronic kidney injury ([place]) 14.   Rash in adult 15.   Atrial fibrillation and flutter ([place]) 16.   Plan    1. Hemodynamics are improved - noted to have jump in troponin overnight to 0.4 (was 0.04) - this is concerning in the setting of hypotension and chest pain for NSTEMI or possible global subendocardial ischemia. Will repeat troponin today. Hold Xarelto (last dose ~5pm on 8/14) - would recommend pursuing LHC tomorrow afternoon to allow Xarelto washout. Switch to IV heparin. Monitor platelets, which are lower today, however, hemoglobin stable and no S/S of bleeding. Repeat CXR tomorrow - ?if  there was a pneumonia leading to infective symptoms and fever. Start aspirin 81 mg daily. Keep NPO p MN tonight.  Time Spent Directly with Patient:  I have spent a total of 25 minutes with the patient reviewing hospital notes, telemetry, EKGs, labs and examining the patient as well as establishing an assessment and plan that was discussed personally with the patient.  > 50% of time was spent in direct patient care.  Length of Stay:  LOS: 1 day   [name], MD, [facility], [place] Director of the Advanced Lipid Disorders &  Cardiovascular Risk Reduction Clinic Diplomate of the American Board of Clinical Lipidology Attending Cardiologist  Direct Dial: [phone]  Fax: [phone]  Website:  [email or web address] 08/31/2017, 11:11 AM

## 2017-09-01 ENCOUNTER — Inpatient Hospital Stay (HOSPITAL_COMMUNITY): Payer: Medicare Other

## 2017-09-01 ENCOUNTER — Inpatient Hospital Stay (HOSPITAL_COMMUNITY)
Admission: EM | Disposition: A | Discharge status: Skilled Nursing Facility | Payer: Self-pay | Source: Home / Self Care | Attending: Internal Medicine

## 2017-09-01 ENCOUNTER — Encounter (HOSPITAL_COMMUNITY): Payer: Self-pay | Admitting: Internal Medicine

## 2017-09-01 DIAGNOSIS — I214 Non-ST elevation (NSTEMI) myocardial infarction: Secondary | ICD-10-CM

## 2017-09-01 HISTORY — PX: LEFT HEART CATH AND CORONARY ANGIOGRAPHY: CATH118249

## 2017-09-01 LAB — CBC WITH DIFFERENTIAL/PLATELET
Abs Immature Granulocytes: 0 10*3/uL (ref 0.0–0.1)
Basophils Absolute: 0 10*3/uL (ref 0.0–0.1)
Basophils Relative: 0 %
EOS ABS: 0.2 10*3/uL (ref 0.0–0.7)
Eosinophils Relative: 2 %
HCT: 38.7 % (ref 36.0–46.0)
Hemoglobin: 12.3 g/dL (ref 12.0–15.0)
IMMATURE GRANULOCYTES: 0 %
LYMPHS PCT: 13 %
Lymphs Abs: 1 10*3/uL (ref 0.7–4.0)
MCH: 29.2 pg (ref 26.0–34.0)
MCHC: 31.8 g/dL (ref 30.0–36.0)
MCV: 91.9 fL (ref 78.0–100.0)
Monocytes Absolute: 0.6 10*3/uL (ref 0.1–1.0)
Monocytes Relative: 8 %
NEUTROS PCT: 77 %
Neutro Abs: 5.8 10*3/uL (ref 1.7–7.7)
Platelets: 154 10*3/uL (ref 150–400)
RBC: 4.21 MIL/uL (ref 3.87–5.11)
RDW: 15 % (ref 11.5–15.5)
WBC: 7.6 10*3/uL (ref 4.0–10.5)

## 2017-09-01 LAB — GLOMERULAR BASEMENT MEMBRANE ANTIBODIES: GBM Ab: 2 units (ref 0–20)

## 2017-09-01 LAB — BASIC METABOLIC PANEL
Anion gap: 8 (ref 5–15)
BUN: 29 mg/dL — ABNORMAL HIGH (ref 8–23)
CALCIUM: 7.9 mg/dL — AB (ref 8.9–10.3)
CHLORIDE: 105 mmol/L (ref 98–111)
CO2: 20 mmol/L — ABNORMAL LOW (ref 22–32)
Creatinine, Ser: 1.47 mg/dL — ABNORMAL HIGH (ref 0.44–1.00)
GFR, EST AFRICAN AMERICAN: 38 mL/min — AB (ref >60)
GFR, EST NON AFRICAN AMERICAN: 32 mL/min — AB (ref >60)
Glucose, Bld: 168 mg/dL — ABNORMAL HIGH (ref 70–99)
Potassium: 3.7 mmol/L (ref 3.5–5.1)
SODIUM: 133 mmol/L — AB (ref 135–145)

## 2017-09-01 LAB — CYCLIC CITRUL PEPTIDE ANTIBODY, IGG/IGA: CCP Antibodies IgG/IgA: 3 units (ref 0–19)

## 2017-09-01 LAB — ANTINUCLEAR ANTIBODIES, IFA: ANA Ab, IFA: NEGATIVE

## 2017-09-01 LAB — HEPARIN LEVEL (UNFRACTIONATED): Heparin Unfractionated: >2.2 IU/mL — ABNORMAL HIGH (ref 0.30–0.70)

## 2017-09-01 LAB — MPO/PR-3 (ANCA) ANTIBODIES: Myeloperoxidase Abs: <9 U/mL (ref 0.0–9.0)

## 2017-09-01 LAB — APTT: aPTT: 101 seconds — ABNORMAL HIGH (ref 24–36)

## 2017-09-01 LAB — PROCALCITONIN: PROCALCITONIN: 0.61 ng/mL

## 2017-09-01 LAB — RHEUMATOID FACTOR: RHEUMATOID FACTOR: 22.1 [IU]/mL — AB (ref 0.0–13.9)

## 2017-09-01 SURGERY — LEFT HEART CATH AND CORONARY ANGIOGRAPHY
Anesthesia: LOCAL

## 2017-09-01 MED ORDER — LIDOCAINE HCL (PF) 1 % IJ SOLN
INTRAMUSCULAR | Status: DC | PRN
  Administered 2017-09-01: 2 mL

## 2017-09-01 MED ORDER — LIDOCAINE HCL (PF) 1 % IJ SOLN
INTRAMUSCULAR | Status: AC
  Filled 2017-09-01: qty 30

## 2017-09-01 MED ORDER — VERAPAMIL HCL 2.5 MG/ML IV SOLN
INTRAVENOUS | Status: AC
  Filled 2017-09-01: qty 2

## 2017-09-01 MED ORDER — SODIUM CHLORIDE 0.9 % IV SOLN
INTRAVENOUS | Status: DC
  Administered 2017-09-01 – 2017-09-02 (×2): via INTRAVENOUS

## 2017-09-01 MED ORDER — FENTANYL CITRATE (PF) 100 MCG/2ML IJ SOLN
INTRAMUSCULAR | Status: AC
  Filled 2017-09-01: qty 2

## 2017-09-01 MED ORDER — HEPARIN (PORCINE) IN NACL 1000-0.9 UT/500ML-% IV SOLN
INTRAVENOUS | Status: DC | PRN
  Administered 2017-09-01 (×2): 500 mL

## 2017-09-01 MED ORDER — SODIUM CHLORIDE 0.9 % IV SOLN: 250.0000 mL | INTRAVENOUS | Status: DC | PRN

## 2017-09-01 MED ORDER — HEPARIN SODIUM (PORCINE) 1000 UNIT/ML IJ SOLN
INTRAMUSCULAR | Status: AC
  Filled 2017-09-01: qty 1

## 2017-09-01 MED ORDER — SODIUM CHLORIDE 0.9% FLUSH
3.0000 mL | Freq: Two times a day (BID) | INTRAVENOUS | Status: DC
  Administered 2017-09-02 – 2017-09-11 (×19): 3 mL via INTRAVENOUS

## 2017-09-01 MED ORDER — HEPARIN (PORCINE) IN NACL 100-0.45 UNIT/ML-% IJ SOLN
950.0000 [IU]/h | INTRAMUSCULAR | Status: DC
  Administered 2017-09-01: 900 [IU]/h via INTRAVENOUS
  Filled 2017-09-01: qty 250

## 2017-09-01 MED ORDER — VERAPAMIL HCL 2.5 MG/ML IV SOLN
INTRAVENOUS | Status: DC | PRN
  Administered 2017-09-01: 10 mL via INTRA_ARTERIAL

## 2017-09-01 MED ORDER — IOHEXOL 350 MG/ML SOLN
INTRAVENOUS | Status: DC | PRN
  Administered 2017-09-01: 25 mL via INTRAVENOUS

## 2017-09-01 MED ORDER — SODIUM CHLORIDE 0.9% FLUSH: 3.0000 mL | INTRAVENOUS | Status: DC | PRN

## 2017-09-01 MED ORDER — MIDAZOLAM HCL 2 MG/2ML IJ SOLN
INTRAMUSCULAR | Status: AC
  Filled 2017-09-01: qty 2

## 2017-09-01 MED ORDER — HEPARIN SODIUM (PORCINE) 1000 UNIT/ML IJ SOLN
INTRAMUSCULAR | Status: DC | PRN
  Administered 2017-09-01: 4000 [IU] via INTRAVENOUS

## 2017-09-01 MED ORDER — HEPARIN (PORCINE) IN NACL 1000-0.9 UT/500ML-% IV SOLN
INTRAVENOUS | Status: AC
  Filled 2017-09-01: qty 1000

## 2017-09-01 SURGICAL SUPPLY — 10 items
CATH 5FR JL3.5 JR4 ANG PIG MP (CATHETERS) ×1 IMPLANT
CATH INFINITI 5FR JL4 (CATHETERS) ×1 IMPLANT
DEVICE RAD COMP TR BAND LRG (VASCULAR PRODUCTS) ×2 IMPLANT
GLIDESHEATH SLEND SS 6F .021 (SHEATH) ×1 IMPLANT
GUIDEWIRE INQWIRE 1.5J.035X260 (WIRE) IMPLANT
INQWIRE 1.5J .035X260CM (WIRE) ×2
KIT HEART LEFT (KITS) ×2 IMPLANT
PACK CARDIAC CATHETERIZATION (CUSTOM PROCEDURE TRAY) ×2 IMPLANT
TRANSDUCER W/STOPCOCK (MISCELLANEOUS) ×2 IMPLANT
TUBING CIL FLEX 10 FLL-RA (TUBING) ×2 IMPLANT

## 2017-09-01 NOTE — Progress Notes (Signed)
PROGRESS NOTE    QUINCY PRISCO  OVF:[id number] DOB: Jan 30, 1935 DOA: 08/29/2017 PCP: [name], MD    Brief Narrative: Brenda Matthews is a very pleasant 82 y.o. female with medical history significant for paroxysmal A. Fib on anticoagulation, hypertension, hyperlipidemia, chronic renal insufficiency stage II, autoimmune hepatitis presents to the emergency department with the chief complaint of chest pain shortness of breath generalized weakness. Initial evaluation reveals max temp of 100.6, EKG with atrial fibrillation and heart rate of 106, acute kidney injury. [facility] are asked to admit  Information is obtained from the patient and the chart. She states she was in her usual state of health until this morning she awakened with chest discomfort. She describes this discomfort as a "pressure" and is located across the chest just below clavicles on left and right. She says it's worse when she talked and when she touches that area of her chest. Associated symptoms include generalized weakness shortness of breath moist sounding but nonproductive cough. She states over the last couple of weeks her heart rhythm has been going "in and out of A. Fib". She has had her metoprolol adjusted and her Lasix adjusted. She states she had an appointment for cardioversion but her rhythm converted on its own so that was canceled. He denies any feeling of palpitations headache dizziness syncope or near-syncope. She denies nausea vomiting diarrhea constipation melena bright red blood per rectum. She denies dysuria hematuria frequency or urgency. She denies lower extremity edema or arthropathy at.  Patient was transfer to ICU overnight due to hypotension. She received IV bolus, started on neo/. Now off pressors. Diagnosed with PNA. Started on ceftriaxone. Renal function improving. HR elevated, resume metoprolol.  Transfer to step down unit.     Assessment & Plan:   Principal Problem:   Sepsis  ([place]) Active Problems:   Hyperlipidemia   Essential hypertension   Chronic renal insufficiency, stage II (mild)   Bronchopneumonia   PAF (paroxysmal atrial fibrillation) (H[facility])   Chronic anticoagulation   Chest pain   DOE (dyspnea on exertion)   Arterial hypotension   Acute-on-chronic kidney injury ([place])   Rash in adult   Atrial fibrillation and flutter ([place])   1-Hypotension; Diferrential cardiogenic vs sepsis.  Received IV bolus. Was on neo overnight.  Blood cultures. No growth.  Pro-calcitonin and lactic acid normal.  CCM was consulted and following.  Chest x ray with PNA, started  IV antibiotics. Ceftriaxone  BP stable today.   2-Dyspnea; EKG changes, mild elevation troponin.  Cardiology consulted. Planning cath today.  Will hold lasix due to hypotension.  Feeling better.   3-Pneumonia;  Continue with  IV ceftriaxone.  Sputum culture,.  Strept Pneumonia negative.  Repeated chest x ray worse infiltrates at bases. Continue with IV antibiotics, incentive spirometry   -Rash; Dr Chase Caller consulted surgery. Vasculitis panel RF 22, rest pending  Rash improving, hold on biopsy.  Suspect related to thrombocytopenia.   4-Paroxysmal A fib;  Continue with amiodarone and xarelto.  Resume metoprolol, now that BP has improved.   5-HTN; hold lasix,  losartan.   6-Hyperlipidemia; continue with statins.   7-Acute on chronic renal failure stage II; Avoid hypotension.  Renal US negative for hydronephrosis.  Improved today.   8-History of NASH;     DVT prophylaxis:on xarelto.  Code Status: Full code.  Family Communication: care discussed with patient.  Disposition Plan: remain in the ICU   Consultants:   Cardiology  CCM   Procedures:   Antimicrobials:  Ceftriaxone 8-14  Subjective: She is breathing better, cough improved.      Objective: Vitals:   09/01/17 0345 09/01/17 0400 09/01/17 0500 09/01/17 0600  BP:  128/84 128/89 108/65  Pulse:  (!) 131 (!)  105 (!) 118  Resp:  (!) 30 (!) 24 (!) 28  Temp:  98.7 F (37.1 C)    TempSrc:  Oral    SpO2: 94% 91% 96% 97%  Weight:      Height:        Intake/Output Summary (Last 24 hours) at 09/01/2017 0710 Last data filed at 09/01/2017 0600 Gross per 24 hour  Intake 1284.38 ml  Output 600 ml  Net 684.38 ml   Filed Weights   08/29/17 1600 08/29/17 2330 08/31/17 1423  Weight: 78.1 kg 77.4 kg 80 kg    Examination:  General exam: NAD Respiratory system: Crackles bases.  Cardiovascular system: S 1, S 2 RRR Gastrointestinal system: BS present, soft, nt Central nervous system: Non focal.  Extremities: Symmetric power.  Skin; rash improved  Data Reviewed: I have personally reviewed following labs and imaging studies  CBC: Recent Labs  Lab 08/29/17 1145 08/29/17 2210 08/30/17 0521 08/31/17 0018 09/01/17 0054  WBC 8.2 10.4 10.1 9.5 7.6  NEUTROABS  --  8.6*  --  7.4 5.8  HGB 14.6 12.3 12.2 12.8 12.3  HCT 46.8* 39.2 39.6 41.0 38.7  MCV 92.9 91.8 93.0 91.9 91.9  PLT 144* 121* 131* 113* 539   Basic Metabolic Panel: Recent Labs  Lab 08/29/17 2210 08/30/17 0034 08/30/17 0521 08/30/17 1831 08/31/17 0018 09/01/17 0054  NA 136  --  139 136 137 133*  K 3.4*  --  3.8 3.6 3.5 3.7  CL 106  --  106 103 103 105  CO2 21*  --  22 22 19* 20*  GLUCOSE 155*  --  130* 136* 119* 168*  BUN 28*  --  33* 33* 33* 29*  CREATININE 1.91*  --  2.24* 2.07* 1.92* 1.47*  CALCIUM 7.4*  --  7.6* 7.9* 8.0* 7.9*  MG  --  2.0  --   --  2.0  --   PHOS  --   --  3.1  --  2.9  --    GFR: Estimated Creatinine Clearance: 29.9 mL/min (A) (by C-G formula based on SCr of 1.47 mg/dL (H)). Liver Function Tests: Recent Labs  Lab 08/29/17 2210 08/30/17 0521  AST 20  --   ALT 26  --   ALKPHOS 53  --   BILITOT 1.0  --   PROT 5.3*  --   ALBUMIN 2.6* 2.6*   No results for input(s): LIPASE, AMYLASE in the last 168 hours. No results for input(s): AMMONIA in the last 168 hours. Coagulation Profile: Recent Labs   Lab 08/29/17 1145  INR 1.65   Cardiac Enzymes: Recent Labs  Lab 08/29/17 2217 08/30/17 0521 08/30/17 1109 08/31/17 0018 08/31/17 1239  TROPONINI <0.03 0.04* 0.04* 0.40* 0.11*   BNP (last 3 results) Recent Labs    08/01/17 1017 08/04/17 1447  PROBNP 631.0* 574.0*   HbA1C: No results for input(s): HGBA1C in the last 72 hours. CBG: Recent Labs  Lab 08/29/17 2330  GLUCAP 124*   Lipid Profile: No results for input(s): CHOL, HDL, LDLCALC, TRIG, CHOLHDL, LDLDIRECT in the last 72 hours. Thyroid Function Tests: No results for input(s): TSH, T4TOTAL, FREET4, T3FREE, THYROIDAB in the last 72 hours. Anemia Panel: No results for input(s): VITAMINB12, FOLATE, FERRITIN, TIBC, IRON, RETICCTPCT in the  last 72 hours. Sepsis Labs: Recent Labs  Lab 08/29/17 1436 08/29/17 1458 08/29/17 2210 08/30/17 0034 08/30/17 1109 08/31/17 0018 09/01/17 0054  PROCALCITON  --  0.21  --   --  1.46 1.18 0.61  LATICACIDVEN 0.66  --  1.2 1.7  --   --   --     Recent Results (from the past 240 hour(s))  Respiratory Panel by PCR     Status: None   Collection Time: 08/29/17  4:30 PM  Result Value Ref Range Status   Adenovirus NOT DETECTED NOT DETECTED Final   Coronavirus 229E NOT DETECTED NOT DETECTED Final   Coronavirus HKU1 NOT DETECTED NOT DETECTED Final   Coronavirus NL63 NOT DETECTED NOT DETECTED Final   Coronavirus OC43 NOT DETECTED NOT DETECTED Final   Metapneumovirus NOT DETECTED NOT DETECTED Final   Rhinovirus / Enterovirus NOT DETECTED NOT DETECTED Final   Influenza A NOT DETECTED NOT DETECTED Final   Influenza B NOT DETECTED NOT DETECTED Final   Parainfluenza Virus 1 NOT DETECTED NOT DETECTED Final   Parainfluenza Virus 2 NOT DETECTED NOT DETECTED Final   Parainfluenza Virus 3 NOT DETECTED NOT DETECTED Final   Parainfluenza Virus 4 NOT DETECTED NOT DETECTED Final   Respiratory Syncytial Virus NOT DETECTED NOT DETECTED Final   Bordetella pertussis NOT DETECTED NOT DETECTED Final    Chlamydophila pneumoniae NOT DETECTED NOT DETECTED Final   Mycoplasma pneumoniae NOT DETECTED NOT DETECTED Final    Comment: Performed at [facility], [place] [address] [place], [place] [address]  MRSA PCR Screening     Status: None   Collection Time: 08/29/17 11:29 PM  Result Value Ref Range Status   MRSA by PCR NEGATIVE NEGATIVE Final    Comment:        The GeneXpert MRSA Assay (FDA approved for NASAL specimens only), is one component of a comprehensive MRSA colonization surveillance program. It is not intended to diagnose MRSA infection nor to guide or monitor treatment for MRSA infections. Performed at [facility], [place] [address] [place], [place] [address]   Culture, blood (routine x 2)     Status: None (Preliminary result)   Collection Time: 08/30/17 12:25 AM  Result Value Ref Range Status   Specimen Description BLOOD LEFT HAND  Final   Special Requests   Final    BOTTLES DRAWN AEROBIC AND ANAEROBIC Blood Culture adequate volume   Culture   Final    NO GROWTH 1 DAY Performed at [facility], [place] [address] [place], [place] [address]    Report Status PENDING  Incomplete  Culture, blood (routine x 2)     Status: None (Preliminary result)   Collection Time: 08/30/17 12:30 AM  Result Value Ref Range Status   Specimen Description BLOOD RIGHT ANTECUBITAL  Final   Special Requests   Final    BOTTLES DRAWN AEROBIC ONLY Blood Culture adequate volume   Culture   Final    NO GROWTH 1 DAY Performed at [facility], [place] [address] [place], [place] [address]    Report Status PENDING  Incomplete         Radiology Studies: US Renal  Result Date: 08/30/2017 CLINICAL DATA:  Initial evaluation for acute renal injury. EXAM: RENAL / URINARY TRACT ULTRASOUND COMPLETE COMPARISON:  None. FINDINGS: Right Kidney: Length: 11.4 cm. Echogenicity within normal limits. No hydronephrosis. 1.3 x 1.0 x 1.2 cm simple cyst present at the lower pole. Left Kidney:  Length: 11.6  cm. Echogenicity within normal limits. No mass or hydronephrosis visualized. Bladder: Decompressed with a Foley catheter in place. IMPRESSION: 1. No hydronephrosis. 2. 1.3 cm simple right renal cyst. 3. Foley catheter in place. Electronically Signed   By: Jeannine Boga M.D.   On: 08/30/2017 14:25   Dg Chest Port 1 View  Result Date: 08/30/2017 CLINICAL DATA:  Shortness of breath beginning today. EXAM: PORTABLE CHEST 1 VIEW COMPARISON:  08/29/2017.  08/04/2017. FINDINGS: Chronic cardiomegaly and aortic atherosclerosis. Mild chronic volume loss at the right lung base. Worsened appearance of the left lung base with increased volume loss and/or pneumonia. Probable associated effusion. Upper lungs are clear. IMPRESSION: Chronic cardiomegaly and aortic atherosclerosis. Worsening of abnormal density at the left lung base consistent with atelectasis and or pneumonia, probably with an associated effusion. Mild chronic volume loss at the right base. Electronically Signed   By: Nelson Chimes M.D.   On: 08/30/2017 08:31        Scheduled Meds: . amiodarone  200 mg Oral BID  . aspirin  81 mg Oral Daily  . atorvastatin  40 mg Oral q1800  . fluticasone  2 spray Each Nare Daily  . ipratropium-albuterol  3 mL Nebulization BID  . mouth rinse  15 mL Mouth Rinse BID  . metoprolol tartrate  25 mg Oral BID  . sodium chloride flush  3 mL Intravenous Q12H   Continuous Infusions: . sodium chloride    . sodium chloride 1 mL/kg/hr (09/01/17 0527)  . cefTRIAXone (ROCEPHIN)  IV Stopped (08/31/17 0948)  . heparin 900 Units/hr (08/31/17 1707)     LOS: 2 days    Time spent: 35 minutes.     Elmarie Shiley, MD [facility] Pager [phone]  If 7PM-7AM, please contact night-coverage [email or web address] Password [email or web address] 09/01/2017, 7:10 AM

## 2017-09-01 NOTE — Progress Notes (Signed)
Recvd back from Cath lab, report at bs from cath lab staff. Pt off heparin now, to be restart 2 hr after t-band d/c (confirmed w/ Rx).

## 2017-09-01 NOTE — Progress Notes (Signed)
Cath reviewed - normal coronaries. Ok to d/c aspirin and restart Xarelto today. No further cardiac work-up necessary.  [facility] will sign off.   Medication Recommendations:  Continue Xarelto, atorvastatin, amiodarone, low dose metoprolol Other recommendations (labs, testing, etc):  none Follow up as an outpatient:  Follow-up with cardiology - will consider repeat DCCV as outpatient.  [name], MD, [facility], [place] Director of the Advanced Lipid Disorders &  Cardiovascular Risk Reduction Clinic Diplomate of the American Board of Clinical Lipidology Attending Cardiologist  Direct Dial: [phone]  Fax: [phone]  Website:  [email or web address]

## 2017-09-01 NOTE — Interval H&P Note (Signed)
History and Physical Interval Note:  09/01/2017 10:16 AM  Brenda Matthews  has presented today for cardiac catheterization, with the diagnosis of NSTEMI. The various methods of treatment have been discussed with the patient and family. After consideration of risks, benefits and other options for treatment, the patient has consented to  Procedure(s): LEFT HEART CATH AND CORONARY ANGIOGRAPHY (N/A) as a surgical intervention .  The patient's history has been reviewed, patient examined, no change in status, stable for surgery.  I have reviewed the patient's chart and labs.  Questions were answered to the patient's satisfaction.    Cath Lab Visit (complete for each Cath Lab visit)  Clinical Evaluation Leading to the Procedure:   ACS: Yes.    Non-ACS:  N/A  Angila Wombles

## 2017-09-01 NOTE — Progress Notes (Signed)
[place] for Heparin (Xarelto on hold) Indication: atrial fibrillation and NSTEMI  Allergies  Allergen Reactions  . Augmentin [Amoxicillin-Pot Clavulanate] Nausea And Vomiting and Other (See Comments)    "projectile vomiting" Has patient had a PCN reaction causing immediate rash, facial/tongue/throat swelling, SOB or lightheadedness with hypotension:No Has patient had a PCN reaction causing severe rash involving mucus membranes or skin necrosis:No Has patient had a PCN reaction that required hospitalization:No Has patient had a PCN reaction occurring within the last 10 years:Yes If all of the above answers are "NO", then may proceed with Cephalosporin use.     Patient Measurements: Height: 5' 2"  (157.5 cm) Weight: 176 lb 5.9 oz (80 kg) IBW/kg (Calculated) : 50.1 Heparin Dosing Weight: 67.1 kg  Vital Signs: Temp: 98.8 F (37.1 C) (08/16 0000) Temp Source: Oral (08/16 0000) BP: 98/82 (08/16 0200) Pulse Rate: 125 (08/16 0200)  Labs: Recent Labs    08/29/17 1145  08/30/17 0521 08/30/17 1109 08/30/17 1831 08/31/17 0018 08/31/17 1239 08/31/17 1400 09/01/17 0054  HGB 14.6   < > 12.2  --   --  12.8  --   --  12.3  HCT 46.8*   < > 39.6  --   --  41.0  --   --  38.7  PLT 144*   < > 131*  --   --  113*  --   --  154  APTT  --   --   --   --   --   --   --  53* 101*  LABPROT 19.3*  --   --   --   --   --   --   --   --   INR 1.65  --   --   --   --   --   --   --   --   HEPARINUNFRC  --   --   --   --   --   --   --  >2.20* >2.20*  CREATININE 1.57*   < > 2.24*  --  2.07* 1.92*  --   --  1.47*  TROPONINI  --    < > 0.04* 0.04*  --  0.40* 0.11*  --   --    < > = values in this interval not displayed.    Estimated Creatinine Clearance: 29.9 mL/min (A) (by C-G formula based on SCr of 1.47 mg/dL (H)).   Medical History: Past Medical History:  Diagnosis Date  . Abnormal EKG approx 2008   Nuclear stress test neg;   . Anxiety    with  panic  . Arthritis   . Autoimmune hepatitis ([place])   . Cataract    s/p surgery--lens implants  . Chronic renal insufficiency, stage II (mild) 12/04/2012   GFR 60s  . Cirrhosis ([place])   . Diverticulosis 2009  . Fracture of radial shaft, left, closed 11/16/10   fell down flight of stairs  . History of kidney stones   . Hx of adenomatous colonic polyps 2002   surveillance colonoscopy 2009, +polypectomy done-tubular adenoma w/out high grade.  05/2013 tubular adenomas--recall 3 yrs  . Hyperlipidemia   . Hypertension   . Low TSH level 02/18/2016   T3 norm, T4 mildly elevated--suspected sick euthyroid syndrome.  Repeat labs 06/2016: normal.  . NASH (nonalcoholic steatohepatitis)   . Osteopenia    DEXA 08/2010; repeat DEXA 02/2015 worse: fosamax started  . PAF (paroxysmal atrial fibrillation) ([place]) 02/2016  when in post-op for ankle surgery; spontaneously converted in hosp, seen by Dr. Debara Pickett in consultation--metoprolol rate control + xarelto recommended.  Metop d/c due to hypot.  Plan to cont xarelto 20 mg qd indef due to CHAD-VASc score of 3.  A-fib w/RVR and CHF 07/2017; pt placed on amiodarone and plan for CV, but pt was in sinus rhythm when she went in for her DC CV, so she was sent home.  . Peripheral edema   . Pneumonia 2015   hx  . Rheumatic fever      Assessment: 82 year old female on Xarelto prior to admission for atrial fibrillation admitted with chest pain and SOB. Cardiology wants to perform left cardiac cath, so Xarelto was held today and switching to IV Heparin.   Last dose of Xarelto was at 17:09PM on 8/14.  Baseline HL >2.20 reflecting recent Xarelto dose.  Baseline aPTT 53 - elevated some.  Utilizing aPTT for now until aPTT and Heparin level are correlating.  Platelets are trending down at 113 (130 yesterday). H/H is stable. No bleeding noted.   8/16 AM update: heparin level remains elevated due to Xarelto use, aPTT is within therapeutic range, CBC stable  Goal of Therapy:   Heparin level 0.3-0.7 units/ml aPTT 66-102 seconds Monitor platelets by anticoagulation protocol: Yes   Plan:  Cont heparin at 900 units/hr 1100 confirmatory aPTT  Narda Bonds, PharmD, BCPS Clinical Pharmacist Phone: [phone]

## 2017-09-01 NOTE — Progress Notes (Signed)
[place] for Heparin (Xarelto on hold) Indication: atrial fibrillation and s/p PCI  Allergies  Allergen Reactions  . Augmentin [Amoxicillin-Pot Clavulanate] Nausea And Vomiting and Other (See Comments)    "projectile vomiting" Has patient had a PCN reaction causing immediate rash, facial/tongue/throat swelling, SOB or lightheadedness with hypotension:No Has patient had a PCN reaction causing severe rash involving mucus membranes or skin necrosis:No Has patient had a PCN reaction that required hospitalization:No Has patient had a PCN reaction occurring within the last 10 years:Yes If all of the above answers are "NO", then may proceed with Cephalosporin use.     Patient Measurements: Height: 5' 2"  (157.5 cm) Weight: 176 lb 5.9 oz (80 kg) IBW/kg (Calculated) : 50.1 Heparin Dosing Weight: 67.1 kg  Vital Signs: Temp: 98.5 F (36.9 C) (08/16 1125) Temp Source: Oral (08/16 1125) BP: 128/90 (08/16 1315) Pulse Rate: 98 (08/16 1315)  Labs: Recent Labs    08/30/17 0521 08/30/17 1109 08/30/17 1831 08/31/17 0018 08/31/17 1239 08/31/17 1400 09/01/17 0054  HGB 12.2  --   --  12.8  --   --  12.3  HCT 39.6  --   --  41.0  --   --  38.7  PLT 131*  --   --  113*  --   --  154  APTT  --   --   --   --   --  53* 101*  HEPARINUNFRC  --   --   --   --   --  >2.20* >2.20*  CREATININE 2.24*  --  2.07* 1.92*  --   --  1.47*  TROPONINI 0.04* 0.04*  --  0.40* 0.11*  --   --     Estimated Creatinine Clearance: 29.9 mL/min (A) (by C-G formula based on SCr of 1.47 mg/dL (H)).   Medical History: Past Medical History:  Diagnosis Date  . Abnormal EKG approx 2008   Nuclear stress test neg;   . Anxiety    with panic  . Arthritis   . Autoimmune hepatitis ([place])   . Cataract    s/p surgery--lens implants  . Chronic renal insufficiency, stage II (mild) 12/04/2012   GFR 60s  . Cirrhosis ([place])   . Diverticulosis 2009  . Fracture of radial shaft, left,  closed 11/16/10   fell down flight of stairs  . History of kidney stones   . Hx of adenomatous colonic polyps 2002   surveillance colonoscopy 2009, +polypectomy done-tubular adenoma w/out high grade.  05/2013 tubular adenomas--recall 3 yrs  . Hyperlipidemia   . Hypertension   . Low TSH level 02/18/2016   T3 norm, T4 mildly elevated--suspected sick euthyroid syndrome.  Repeat labs 06/2016: normal.  . NASH (nonalcoholic steatohepatitis)   . Osteopenia    DEXA 08/2010; repeat DEXA 02/2015 worse: fosamax started  . PAF (paroxysmal atrial fibrillation) ([place]) 02/2016   when in post-op for ankle surgery; spontaneously converted in hosp, seen by Dr. Debara Pickett in consultation--metoprolol rate control + xarelto recommended.  Metop d/c due to hypot.  Plan to cont xarelto 20 mg qd indef due to CHAD-VASc score of 3.  A-fib w/RVR and CHF 07/2017; pt placed on amiodarone and plan for CV, but pt was in sinus rhythm when she went in for her DC CV, so she was sent home.  . Peripheral edema   . Pneumonia 2015   hx  . Rheumatic fever      Assessment: 82 year old female on Xarelto prior  to admission for atrial fibrillation admitted with chest pain and SOB. Now s/p LHC. Pharmacy consulted to resume IV heparin 2 hours after TR band removal. TR band removed at ~ 1330.    Goal of Therapy:  Heparin level 0.3-0.7 units/ml aPTT 66-102 seconds Monitor platelets by anticoagulation protocol: Yes   Plan:  Resume IV heparin at 900 units/hr at 1530 today APTT at midnight  Monitor for bleeding   Brenda Matthews, PharmD., BCPS Clinical Pharmacist Clinical phone for 09/01/17 until 3:30pm: [phone] If after 3:30pm, please refer to [facility] for unit-specific pharmacist

## 2017-09-01 NOTE — Progress Notes (Signed)
F/u scheduled 9/26 with Dr. Debara Pickett (see AVS).

## 2017-09-02 ENCOUNTER — Inpatient Hospital Stay (HOSPITAL_COMMUNITY): Payer: Medicare Other

## 2017-09-02 LAB — CBC WITH DIFFERENTIAL/PLATELET
ABS IMMATURE GRANULOCYTES: 0 10*3/uL (ref 0.0–0.1)
Basophils Absolute: 0 10*3/uL (ref 0.0–0.1)
Basophils Relative: 1 %
EOS ABS: 0.2 10*3/uL (ref 0.0–0.7)
Eosinophils Relative: 4 %
HEMATOCRIT: 37.4 % (ref 36.0–46.0)
Hemoglobin: 11.6 g/dL — ABNORMAL LOW (ref 12.0–15.0)
IMMATURE GRANULOCYTES: 1 %
LYMPHS ABS: 0.8 10*3/uL (ref 0.7–4.0)
Lymphocytes Relative: 13 %
MCH: 29 pg (ref 26.0–34.0)
MCHC: 31 g/dL (ref 30.0–36.0)
MCV: 93.5 fL (ref 78.0–100.0)
Monocytes Absolute: 0.5 10*3/uL (ref 0.1–1.0)
Monocytes Relative: 7 %
NEUTROS ABS: 4.7 10*3/uL (ref 1.7–7.7)
NEUTROS PCT: 74 %
PLATELETS: 152 10*3/uL (ref 150–400)
RBC: 4 MIL/uL (ref 3.87–5.11)
RDW: 14.8 % (ref 11.5–15.5)
WBC: 6.3 10*3/uL (ref 4.0–10.5)

## 2017-09-02 LAB — BASIC METABOLIC PANEL
ANION GAP: 10 (ref 5–15)
BUN: 27 mg/dL — ABNORMAL HIGH (ref 8–23)
CALCIUM: 8.1 mg/dL — AB (ref 8.9–10.3)
CO2: 19 mmol/L — AB (ref 22–32)
Chloride: 110 mmol/L (ref 98–111)
Creatinine, Ser: 1.28 mg/dL — ABNORMAL HIGH (ref 0.44–1.00)
GFR, EST AFRICAN AMERICAN: 45 mL/min — AB (ref >60)
GFR, EST NON AFRICAN AMERICAN: 38 mL/min — AB (ref >60)
Glucose, Bld: 140 mg/dL — ABNORMAL HIGH (ref 70–99)
Potassium: 3.6 mmol/L (ref 3.5–5.1)
Sodium: 139 mmol/L (ref 135–145)

## 2017-09-02 LAB — ANGIOTENSIN CONVERTING ENZYME: Angiotensin-Converting Enzyme: 24 U/L (ref 14–82)

## 2017-09-02 LAB — ANTI-SCLERODERMA ANTIBODY: Scleroderma (Scl-70) (ENA) Antibody, IgG: <0.2 AI (ref 0.0–0.9)

## 2017-09-02 LAB — SJOGRENS SYNDROME-A EXTRACTABLE NUCLEAR ANTIBODY

## 2017-09-02 LAB — APTT: aPTT: 57 seconds — ABNORMAL HIGH (ref 24–36)

## 2017-09-02 LAB — ANTI-DNA ANTIBODY, DOUBLE-STRANDED: ds DNA Ab: <1 IU/mL (ref 0–9)

## 2017-09-02 LAB — SJOGRENS SYNDROME-B EXTRACTABLE NUCLEAR ANTIBODY

## 2017-09-02 LAB — BRAIN NATRIURETIC PEPTIDE: B Natriuretic Peptide: 1314.7 pg/mL — ABNORMAL HIGH (ref 0.0–100.0)

## 2017-09-02 MED ORDER — ALPRAZOLAM 0.5 MG PO TABS
0.5000 mg | ORAL_TABLET | Freq: Three times a day (TID) | ORAL | Status: DC | PRN
Start: 2017-09-02 — End: 2017-09-11
  Administered 2017-09-02 – 2017-09-10 (×9): 0.5 mg via ORAL
  Filled 2017-09-02 (×11): qty 1

## 2017-09-02 MED ORDER — FUROSEMIDE 10 MG/ML IJ SOLN
40.0000 mg | Freq: Once | INTRAMUSCULAR | Status: AC
  Administered 2017-09-02: 40 mg via INTRAVENOUS
  Filled 2017-09-02: qty 4

## 2017-09-02 MED ORDER — GUAIFENESIN-DM 100-10 MG/5ML PO SYRP
5.0000 mL | ORAL_SOLUTION | ORAL | Status: DC | PRN
Start: 2017-09-02 — End: 2017-09-11
  Administered 2017-09-02 – 2017-09-09 (×19): 5 mL via ORAL
  Filled 2017-09-02 (×20): qty 5

## 2017-09-02 MED ORDER — POTASSIUM CHLORIDE CRYS ER 20 MEQ PO TBCR
40.0000 meq | EXTENDED_RELEASE_TABLET | Freq: Once | ORAL | Status: AC
  Administered 2017-09-02: 40 meq via ORAL
  Filled 2017-09-02: qty 2

## 2017-09-02 MED ORDER — DM-GUAIFENESIN ER 30-600 MG PO TB12
1.0000 | ORAL_TABLET | Freq: Two times a day (BID) | ORAL | Status: DC
  Administered 2017-09-02 – 2017-09-05 (×7): 1 via ORAL
  Filled 2017-09-02 (×8): qty 1

## 2017-09-02 MED ORDER — RIVAROXABAN 15 MG PO TABS
15.0000 mg | ORAL_TABLET | Freq: Every day | ORAL | Status: DC
  Administered 2017-09-02 – 2017-09-04 (×3): 15 mg via ORAL
  Filled 2017-09-02 (×3): qty 1

## 2017-09-02 NOTE — Progress Notes (Signed)
PROGRESS NOTE    Brenda Matthews  XBJ:[id number] DOB: [birth year] DOA: 08/29/2017 PCP: [name], MD    Brief Narrative: Brenda Matthews is a very pleasant 82 y.o. female with medical history significant for paroxysmal A. Fib on anticoagulation, hypertension, hyperlipidemia, chronic renal insufficiency stage II, autoimmune hepatitis presents to the emergency department with the chief complaint of chest pain shortness of breath generalized weakness. Initial evaluation reveals max temp of 100.6, EKG with atrial fibrillation and heart rate of 106, acute kidney injury. [facility] are asked to admit  Information is obtained from the patient and the chart. She states she was in her usual state of health until this morning she awakened with chest discomfort. She describes this discomfort as a "pressure" and is located across the chest just below clavicles on left and right. She says it's worse when she talked and when she touches that area of her chest. Associated symptoms include generalized weakness shortness of breath moist sounding but nonproductive cough. She states over the last couple of weeks her heart rhythm has been going "in and out of A. Fib". She has had her metoprolol adjusted and her Lasix adjusted. She states she had an appointment for cardioversion but her rhythm converted on its own so that was canceled. He denies any feeling of palpitations headache dizziness syncope or near-syncope. She denies nausea vomiting diarrhea constipation melena bright red blood per rectum. She denies dysuria hematuria frequency or urgency. She denies lower extremity edema or arthropathy at.  Patient was transfer to ICU overnight due to hypotension. She received IV bolus, started on neo/. Now off pressors. Diagnosed with PNA. Started on ceftriaxone. Renal function improving. HR elevated, resume metoprolol.  Transfer to step down unit.     Assessment & Plan:   Principal Problem:   Sepsis  ([place]) Active Problems:   Hyperlipidemia   Essential hypertension   Chronic renal insufficiency, stage II (mild)   Bronchopneumonia   PAF (paroxysmal atrial fibrillation) (HCC)   Chronic anticoagulation   Chest pain   DOE (dyspnea on exertion)   Arterial hypotension   Acute-on-chronic kidney injury (HCC)   Rash in adult   Atrial fibrillation and flutter (HCC)   Non-ST elevation (NSTEMI) myocardial infarction ([place])   1-Hypotension; Diferrential cardiogenic vs sepsis.  Received IV bolus. Was on neo for one overnight.  Blood cultures. No growth.  Pro-calcitonin and lactic acid normal.  CCM was consulted and sign off.   Chest x ray with PNA, started  IV antibiotics. Ceftriaxone    2-Dyspnea; EKG changes, mild elevation troponin.  Cardiology consulted. Underwent cath; no obstructive  Worsening hypoxemia overnight. Worsening dyspnea. Repeated chest x ray stable. Persistent atelectasis infiltrates. Discussed x ray with Dr Chase Caller, will get CT chest to better evaluate. IV lasix ordered.    3-Pneumonia;  Continue with  IV ceftriaxone.  Sputum culture,.  Strept Pneumonia negative.    -Rash; Dr Chase Caller consulted surgery. Vasculitis panel RF 22, rest pending  Rash improving, hold on biopsy.  Suspect related to thrombocytopenia.   4-Paroxysmal A fib;  Continue with amiodarone and xarelto.  Resume metoprolol, now that BP has improved.   5-HTN; hold lasix,  losartan.   6-Hyperlipidemia; continue with statins.   7-Acute on chronic renal failure stage II; Avoid hypotension.  Renal US negative for hydronephrosis.  Improved today.   8-History of NASH;     DVT prophylaxis:on xarelto.  Code Status: Full code.  Family Communication: care discussed with patient.  Disposition Plan: remain  in the ICU   Consultants:   Cardiology  CCM   Procedures:   Antimicrobials: Ceftriaxone 8-14  Subjective: She was very anxious this morning, crying, having worsening dyspnea,  cough.      Objective: Vitals:   09/02/17 0327 09/02/17 0400 09/02/17 0500 09/02/17 0600  BP:  110/67 124/86 (!) 132/97  Pulse:  (!) 142 (!) 119 95  Resp:  (!) 32 (!) 26 (!) 26  Temp: 98.1 F (36.7 C)     TempSrc: Oral     SpO2:  98% 94% 99%  Weight:      Height:        Intake/Output Summary (Last 24 hours) at 09/02/2017 0746 Last data filed at 09/02/2017 0600 Gross per 24 hour  Intake 2569.49 ml  Output 600 ml  Net 1969.49 ml   Filed Weights   08/29/17 1600 08/29/17 2330 08/31/17 1423  Weight: 78.1 kg 77.4 kg 80 kg    Examination:  General exam: Mild distress Respiratory system: decrease breath sound , bilateral crackles.  Cardiovascular system: S 1, S 2 RRR Gastrointestinal system: BS present, soft, nt Central nervous system: non focal.  Extremities: Symmetric power.  Skin; rash improved  Data Reviewed: I have personally reviewed following labs and imaging studies  CBC: Recent Labs  Lab 08/29/17 2210 08/30/17 0521 08/31/17 0018 09/01/17 0054 09/02/17 0336  WBC 10.4 10.1 9.5 7.6 6.3  NEUTROABS 8.6*  --  7.4 5.8 4.7  HGB 12.3 12.2 12.8 12.3 11.6*  HCT 39.2 39.6 41.0 38.7 37.4  MCV 91.8 93.0 91.9 91.9 93.5  PLT 121* 131* 113* 154 016   Basic Metabolic Panel: Recent Labs  Lab 08/30/17 0034 08/30/17 0521 08/30/17 1831 08/31/17 0018 09/01/17 0054 09/02/17 0336  NA  --  139 136 137 133* 139  K  --  3.8 3.6 3.5 3.7 3.6  CL  --  106 103 103 105 110  CO2  --  22 22 19* 20* 19*  GLUCOSE  --  130* 136* 119* 168* 140*  BUN  --  33* 33* 33* 29* 27*  CREATININE  --  2.24* 2.07* 1.92* 1.47* 1.28*  CALCIUM  --  7.6* 7.9* 8.0* 7.9* 8.1*  MG 2.0  --   --  2.0  --   --   PHOS  --  3.1  --  2.9  --   --    GFR: Estimated Creatinine Clearance: 34.4 mL/min (A) (by C-G formula based on SCr of 1.28 mg/dL (H)). Liver Function Tests: Recent Labs  Lab 08/29/17 2210 08/30/17 0521  AST 20  --   ALT 26  --   ALKPHOS 53  --   BILITOT 1.0  --   PROT 5.3*  --    ALBUMIN 2.6* 2.6*   No results for input(s): LIPASE, AMYLASE in the last 168 hours. No results for input(s): AMMONIA in the last 168 hours. Coagulation Profile: Recent Labs  Lab 08/29/17 1145  INR 1.65   Cardiac Enzymes: Recent Labs  Lab 08/29/17 2217 08/30/17 0521 08/30/17 1109 08/31/17 0018 08/31/17 1239  TROPONINI <0.03 0.04* 0.04* 0.40* 0.11*   BNP (last 3 results) Recent Labs    08/01/17 1017 08/04/17 1447  PROBNP 631.0* 574.0*   HbA1C: No results for input(s): HGBA1C in the last 72 hours. CBG: Recent Labs  Lab 08/29/17 2330  GLUCAP 124*   Lipid Profile: No results for input(s): CHOL, HDL, LDLCALC, TRIG, CHOLHDL, LDLDIRECT in the last 72 hours. Thyroid Function Tests: No results for  input(s): TSH, T4TOTAL, FREET4, T3FREE, THYROIDAB in the last 72 hours. Anemia Panel: No results for input(s): VITAMINB12, FOLATE, FERRITIN, TIBC, IRON, RETICCTPCT in the last 72 hours. Sepsis Labs: Recent Labs  Lab 08/29/17 1436 08/29/17 1458 08/29/17 2210 08/30/17 0034 08/30/17 1109 08/31/17 0018 09/01/17 0054  PROCALCITON  --  0.21  --   --  1.46 1.18 0.61  LATICACIDVEN 0.66  --  1.2 1.7  --   --   --     Recent Results (from the past 240 hour(s))  Respiratory Panel by PCR     Status: None   Collection Time: 08/29/17  4:30 PM  Result Value Ref Range Status   Adenovirus NOT DETECTED NOT DETECTED Final   Coronavirus 229E NOT DETECTED NOT DETECTED Final   Coronavirus HKU1 NOT DETECTED NOT DETECTED Final   Coronavirus NL63 NOT DETECTED NOT DETECTED Final   Coronavirus OC43 NOT DETECTED NOT DETECTED Final   Metapneumovirus NOT DETECTED NOT DETECTED Final   Rhinovirus / Enterovirus NOT DETECTED NOT DETECTED Final   Influenza A NOT DETECTED NOT DETECTED Final   Influenza B NOT DETECTED NOT DETECTED Final   Parainfluenza Virus 1 NOT DETECTED NOT DETECTED Final   Parainfluenza Virus 2 NOT DETECTED NOT DETECTED Final   Parainfluenza Virus 3 NOT DETECTED NOT DETECTED  Final   Parainfluenza Virus 4 NOT DETECTED NOT DETECTED Final   Respiratory Syncytial Virus NOT DETECTED NOT DETECTED Final   Bordetella pertussis NOT DETECTED NOT DETECTED Final   Chlamydophila pneumoniae NOT DETECTED NOT DETECTED Final   Mycoplasma pneumoniae NOT DETECTED NOT DETECTED Final    Comment: Performed at [facility], [place] [address], [place], [place] [address]  MRSA PCR Screening     Status: None   Collection Time: 08/29/17 11:29 PM  Result Value Ref Range Status   MRSA by PCR NEGATIVE NEGATIVE Final    Comment:        The GeneXpert MRSA Assay (FDA approved for NASAL specimens only), is one component of a comprehensive MRSA colonization surveillance program. It is not intended to diagnose MRSA infection nor to guide or monitor treatment for MRSA infections. Performed at [facility], [place] [address] [place], [place] [address]   Culture, blood (routine x 2)     Status: None (Preliminary result)   Collection Time: 08/30/17 12:25 AM  Result Value Ref Range Status   Specimen Description BLOOD LEFT HAND  Final   Special Requests   Final    BOTTLES DRAWN AEROBIC AND ANAEROBIC Blood Culture adequate volume   Culture   Final    NO GROWTH 2 DAYS Performed at [facility], [place] [address], [place], [place] [address]    Report Status PENDING  Incomplete  Culture, blood (routine x 2)     Status: None (Preliminary result)   Collection Time: 08/30/17 12:30 AM  Result Value Ref Range Status   Specimen Description BLOOD RIGHT ANTECUBITAL  Final   Special Requests   Final    BOTTLES DRAWN AEROBIC ONLY Blood Culture adequate volume   Culture   Final    NO GROWTH 2 DAYS Performed at [facility], [place] [address] [place], [place] [address]    Report Status PENDING  Incomplete         Radiology Studies: Dg Chest 2 View  Result Date: 09/01/2017 CLINICAL DATA:  Pneumonia EXAM: CHEST - 2 VIEW COMPARISON:  08/30/2017 chest radiograph. FINDINGS: Stable  cardiomediastinal silhouette with mild cardiomegaly.  No pneumothorax. Small bilateral pleural effusions, increased bilaterally. No overt pulmonary edema. Patchy bibasilar lung opacities, slightly increased bilaterally. IMPRESSION: 1. Patchy bibasilar lung opacities, slightly increased bilaterally, which could represent aspiration, pneumonia and/or atelectasis. 2. Small bilateral pleural effusions, increased bilaterally. 3. Stable cardiomegaly without overt pulmonary edema. Electronically Signed   By: Ilona Sorrel M.D.   On: 09/01/2017 09:30        Scheduled Meds: . amiodarone  200 mg Oral BID  . aspirin  81 mg Oral Daily  . atorvastatin  40 mg Oral q1800  . dextromethorphan-guaiFENesin  1 tablet Oral BID  . fluticasone  2 spray Each Nare Daily  . furosemide  40 mg Intravenous Once  . ipratropium-albuterol  3 mL Nebulization BID  . mouth rinse  15 mL Mouth Rinse BID  . metoprolol tartrate  25 mg Oral BID  . rivaroxaban  15 mg Oral Q supper  . sodium chloride flush  3 mL Intravenous Q12H   Continuous Infusions: . sodium chloride    . cefTRIAXone (ROCEPHIN)  IV Stopped (09/01/17 1201)     LOS: 3 days    Time spent: 35 minutes.     Elmarie Shiley, MD [facility] Pager [phone]  If 7PM-7AM, please contact night-coverage [email or web address] Password [facility] 09/02/2017, 7:46 AM

## 2017-09-02 NOTE — Evaluation (Signed)
Clinical/Bedside Swallow Evaluation Patient Details  Name: Brenda Matthews MRN: [id number] Date of Birth: Sep 01, 1935  Today's Date: 09/02/2017 Time: SLP Start Time (ACUTE ONLY): 1145 SLP Stop Time (ACUTE ONLY): 1200 SLP Time Calculation (min) (ACUTE ONLY): 15 min  Past Medical History:  Past Medical History:  Diagnosis Date  . Abnormal EKG approx 2008   Nuclear stress test neg;   . Anxiety    with panic  . Arthritis   . Autoimmune hepatitis ([place])   . Cataract    s/p surgery--lens implants  . Chronic renal insufficiency, stage II (mild) 12/04/2012   GFR 60s  . Cirrhosis ([place])   . Diverticulosis 2009  . Fracture of radial shaft, left, closed 11/16/10   fell down flight of stairs  . History of kidney stones   . Hx of adenomatous colonic polyps 2002   surveillance colonoscopy 2009, +polypectomy done-tubular adenoma w/out high grade.  05/2013 tubular adenomas--recall 3 yrs  . Hyperlipidemia   . Hypertension   . Low TSH level 02/18/2016   T3 norm, T4 mildly elevated--suspected sick euthyroid syndrome.  Repeat labs 06/2016: normal.  . NASH (nonalcoholic steatohepatitis)   . Osteopenia    DEXA 08/2010; repeat DEXA 02/2015 worse: fosamax started  . PAF (paroxysmal atrial fibrillation) ([place]) 02/2016   when in post-op for ankle surgery; spontaneously converted in hosp, seen by Dr. Debara Pickett in consultation--metoprolol rate control + xarelto recommended.  Metop d/c due to hypot.  Plan to cont xarelto 20 mg qd indef due to CHAD-VASc score of 3.  A-fib w/RVR and CHF 07/2017; pt placed on amiodarone and plan for CV, but pt was in sinus rhythm when she went in for her DC CV, so she was sent home.  . Peripheral edema   . Pneumonia 2015   hx  . Rheumatic fever    Past Surgical History:  Past Surgical History:  Procedure Laterality Date  . APPENDECTOMY  1966   done during surgery for tubal pregnancy  . CATARACT EXTRACTION W/ INTRAOCULAR LENS IMPLANT  2013   bilat  . COLONOSCOPY W/ POLYPECTOMY   05/2013   +diverticulosis; recall 3 yrs (Dr. Henrene Pastor)  . DEXA  02/2015   T score -2.1 in both femoral necks; FRAX 10 yr risk of major osteoporotic fracture was 21%---fosamax started  . EYE SURGERY    . LEFT HEART CATH AND CORONARY ANGIOGRAPHY N/A 09/01/2017   Procedure: LEFT HEART CATH AND CORONARY ANGIOGRAPHY;  Surgeon: Nelva Bush, MD;  Location: [facility];  Service: Cardiovascular;  Laterality: N/A;  . OPEN REDUCTION INTERNAL FIXATION (ORIF) TIBIA/FIBULA FRACTURE Left 02/18/2016   Procedure: OPEN REDUCTION INTERNAL FIXATION (ORIF) Right ankle trimalleolar fracture;  Surgeon: Wylene Simmer, MD;  Location: [facility];  Service: Orthopedics;  Laterality: Left;  requests 64mns  . ORIF RADIAL FRACTURE  11/18/10   left; s/p slip on slippery floor and fell  . TONSILLECTOMY    . TRANSTHORACIC ECHOCARDIOGRAM  02/18/2016; 08/10/17   LVEF of 55-60%, mild AI and mild MR and normal biatrial size.  07/2017--normal LV function, mild enlarge aortic root, mild/mod TR, bilat atrial enlargement.   HPI:  82y.o. female with medical history significant for paroxysmal A. Fib on anticoagulation, hypertension, hyperlipidemia, chronic renal insufficiency stage II, autoimmune hepatitis presents to the emergency department with the chief complaint of chest pain shortness of breath generalized weakness. Initial evaluation reveals max temp of 100.6, EKG with atrial fibrillation and heart rate of 106, acute kidney injury. Transfered to ICU due to hypotension.  Dx with PNA. CXR with small bilateral pleural effusions and patchy bibasilar airspace disease.   Assessment / Plan / Recommendation Clinical Impression  Patient presents with what appears to be normal oropharyngeal swallowing function. Swift oral transit noted and no overt indication of aspiration observed, even when challenged with multiple consecutive sips of thin liquids across greater than 4 ounces. No f/u indicated at this time. Note plans for chest CT. MD, if feel  necessary based on results to r/o silent aspiration (patient without significant risk factors), please order MBS.  SLP Visit Diagnosis: Dysphagia, unspecified (R13.10)    Aspiration Risk  No limitations    Diet Recommendation Regular;Thin liquid   Liquid Administration via: Cup;Straw Medication Administration: Whole meds with liquid Supervision: Patient able to self feed Compensations: Small sips/bites Postural Changes: Seated upright at 90 degrees    Other  Recommendations Oral Care Recommendations: Oral care BID   Follow up Recommendations None        Swallow Study   General HPI: 82 y.o. female with medical history significant for paroxysmal A. Fib on anticoagulation, hypertension, hyperlipidemia, chronic renal insufficiency stage II, autoimmune hepatitis presents to the emergency department with the chief complaint of chest pain shortness of breath generalized weakness. Initial evaluation reveals max temp of 100.6, EKG with atrial fibrillation and heart rate of 106, acute kidney injury. Transfered to ICU due to hypotension. Dx with PNA. CXR with small bilateral pleural effusions and patchy bibasilar airspace disease. Type of Study: Bedside Swallow Evaluation Previous Swallow Assessment: none Diet Prior to this Study: Regular;Thin liquids Temperature Spikes Noted: No Respiratory Status: Nasal cannula History of Recent Intubation: No Behavior/Cognition: Alert;Cooperative;Pleasant mood Oral Cavity Assessment: Within Functional Limits Oral Care Completed by SLP: No Oral Cavity - Dentition: Dentures, top;Dentures, bottom Vision: Functional for self-feeding Self-Feeding Abilities: Able to feed self Patient Positioning: Upright in chair Baseline Vocal Quality: Normal Volitional Cough: Strong Volitional Swallow: Able to elicit    Oral/Motor/Sensory Function Overall Oral Motor/Sensory Function: Within functional limits   Ice Chips Ice chips: Not tested   Thin Liquid Thin Liquid:  Within functional limits Presentation: Cup;Self Fed;Straw    Nectar Thick Nectar Thick Liquid: Not tested   Honey Thick Honey Thick Liquid: Not tested   Puree Puree: Within functional limits Presentation: Spoon;Self Fed   Solid     Solid: Within functional limits Presentation: Self Fed     [employer] MA, CCC-SLP   Latriece Anstine Meryl 09/02/2017,12:43 PM

## 2017-09-02 NOTE — Progress Notes (Signed)
[facility] for Heparin (Xarelto on hold) Indication: atrial fibrillation and NSTEMI, s/p cath  Allergies  Allergen Reactions  . Augmentin [Amoxicillin-Pot Clavulanate] Nausea And Vomiting and Other (See Comments)    "projectile vomiting" Has patient had a PCN reaction causing immediate rash, facial/tongue/throat swelling, SOB or lightheadedness with hypotension:No Has patient had a PCN reaction causing severe rash involving mucus membranes or skin necrosis:No Has patient had a PCN reaction that required hospitalization:No Has patient had a PCN reaction occurring within the last 10 years:Yes If all of the above answers are "NO", then may proceed with Cephalosporin use.     Patient Measurements: Height: 5' 2"  (157.5 cm) Weight: 176 lb 5.9 oz (80 kg) IBW/kg (Calculated) : 50.1 Heparin Dosing Weight: 67.1 kg  Vital Signs: Temp: 97.2 F (36.2 C) (08/16 2349) Temp Source: Axillary (08/16 2349) BP: 122/83 (08/17 0000) Pulse Rate: 103 (08/17 0000)  Labs: Recent Labs    08/30/17 0521 08/30/17 1109 08/30/17 1831 08/31/17 0018 08/31/17 1239 08/31/17 1400 09/01/17 0054 09/01/17 2354  HGB 12.2  --   --  12.8  --   --  12.3  --   HCT 39.6  --   --  41.0  --   --  38.7  --   PLT 131*  --   --  113*  --   --  154  --   APTT  --   --   --   --   --  53* 101* 57*  HEPARINUNFRC  --   --   --   --   --  >2.20* >2.20*  --   CREATININE 2.24*  --  2.07* 1.92*  --   --  1.47*  --   TROPONINI 0.04* 0.04*  --  0.40* 0.11*  --   --   --     Estimated Creatinine Clearance: 29.9 mL/min (A) (by C-G formula based on SCr of 1.47 mg/dL (H)).   Medical History: Past Medical History:  Diagnosis Date  . Abnormal EKG approx 2008   Nuclear stress test neg;   . Anxiety    with panic  . Arthritis   . Autoimmune hepatitis ([place])   . Cataract    s/p surgery--lens implants  . Chronic renal insufficiency, stage II (mild) 12/04/2012   GFR 60s  . Cirrhosis  ([place])   . Diverticulosis 2009  . Fracture of radial shaft, left, closed 11/16/10   fell down flight of stairs  . History of kidney stones   . Hx of adenomatous colonic polyps 2002   surveillance colonoscopy 2009, +polypectomy done-tubular adenoma w/out high grade.  05/2013 tubular adenomas--recall 3 yrs  . Hyperlipidemia   . Hypertension   . Low TSH level 02/18/2016   T3 norm, T4 mildly elevated--suspected sick euthyroid syndrome.  Repeat labs 06/2016: normal.  . NASH (nonalcoholic steatohepatitis)   . Osteopenia    DEXA 08/2010; repeat DEXA 02/2015 worse: fosamax started  . PAF (paroxysmal atrial fibrillation) ([place]) 02/2016   when in post-op for ankle surgery; spontaneously converted in hosp, seen by Dr. Debara Pickett in consultation--metoprolol rate control + xarelto recommended.  Metop d/c due to hypot.  Plan to cont xarelto 20 mg qd indef due to CHAD-VASc score of 3.  A-fib w/RVR and CHF 07/2017; pt placed on amiodarone and plan for CV, but pt was in sinus rhythm when she went in for her DC CV, so she was sent home.  . Peripheral edema   .  Pneumonia 2015   hx  . Rheumatic fever      Assessment: 82 year old female on Xarelto prior to admission for atrial fibrillation admitted with chest pain and SOB. Cardiology wants to perform left cardiac cath, so Xarelto was held today and switching to IV Heparin.   Last dose of Xarelto was at 17:09PM on 8/14.  Baseline HL >2.20 reflecting recent Xarelto dose.  Baseline aPTT 53 - elevated some.  Utilizing aPTT for now until aPTT and Heparin level are correlating.  Platelets are trending down at 113 (130 yesterday). H/H is stable. No bleeding noted.   8/16 AM update: heparin level remains elevated due to Xarelto use, aPTT is just below goal after re-starting heparin s/p cath  Goal of Therapy:  Heparin level 0.3-0.7 units/ml aPTT 66-102 seconds Monitor platelets by anticoagulation protocol: Yes   Plan:  Inc heparin slightly to 950 units/hr 0900  aPTT/heparin level  Narda Bonds, PharmD, BCPS Clinical Pharmacist Phone: [phone]

## 2017-09-02 NOTE — Progress Notes (Signed)
[facility] for Xarelto  Indication: atrial fibrillation  Allergies  Allergen Reactions  . Augmentin [Amoxicillin-Pot Clavulanate] Nausea And Vomiting and Other (See Comments)    "projectile vomiting" Has patient had a PCN reaction causing immediate rash, facial/tongue/throat swelling, SOB or lightheadedness with hypotension:No Has patient had a PCN reaction causing severe rash involving mucus membranes or skin necrosis:No Has patient had a PCN reaction that required hospitalization:No Has patient had a PCN reaction occurring within the last 10 years:Yes If all of the above answers are "NO", then may proceed with Cephalosporin use.     Patient Measurements: Height: 5' 2"  (157.5 cm) Weight: 176 lb 5.9 oz (80 kg) IBW/kg (Calculated) : 50.1 Heparin Dosing Weight: 67.1 kg  Vital Signs: Temp: 98.1 F (36.7 C) (08/17 0327) Temp Source: Oral (08/17 0327) BP: 132/97 (08/17 0600) Pulse Rate: 95 (08/17 0600)  Labs: Recent Labs    08/30/17 1109  08/31/17 0018 08/31/17 1239 08/31/17 1400 09/01/17 0054 09/01/17 2354 09/02/17 0336  HGB  --    < > 12.8  --   --  12.3  --  11.6*  HCT  --   --  41.0  --   --  38.7  --  37.4  PLT  --   --  113*  --   --  154  --  152  APTT  --   --   --   --  53* 101* 57*  --   HEPARINUNFRC  --   --   --   --  >2.20* >2.20*  --   --   CREATININE  --    < > 1.92*  --   --  1.47*  --  1.28*  TROPONINI 0.04*  --  0.40* 0.11*  --   --   --   --    < > = values in this interval not displayed.    Estimated Creatinine Clearance: 34.4 mL/min (A) (by C-G formula based on SCr of 1.28 mg/dL (H)).  Assessment: 82 year old female with Afib s/p cath to resume Xarelto   Plan:  Xarelto 15 mg daily   Phillis Knack, PharmD, BCPS

## 2017-09-02 NOTE — Progress Notes (Signed)
Pt received from 50M to 4E10. Pt A&Ox4;VSS. Pt experincing dyspnea with exertion but improved with rest. Pt assessed.CCMD notified. Pt resting in bed with call bell within reach. Will continue to monitor.  Lilla Shook, BSN

## 2017-09-03 ENCOUNTER — Inpatient Hospital Stay (HOSPITAL_COMMUNITY): Payer: Medicare Other

## 2017-09-03 DIAGNOSIS — J189 Pneumonia, unspecified organism: Secondary | ICD-10-CM

## 2017-09-03 DIAGNOSIS — I5033 Acute on chronic diastolic (congestive) heart failure: Secondary | ICD-10-CM

## 2017-09-03 DIAGNOSIS — I10 Essential (primary) hypertension: Secondary | ICD-10-CM

## 2017-09-03 DIAGNOSIS — R0902 Hypoxemia: Secondary | ICD-10-CM

## 2017-09-03 DIAGNOSIS — R0609 Other forms of dyspnea: Secondary | ICD-10-CM

## 2017-09-03 DIAGNOSIS — Z7901 Long term (current) use of anticoagulants: Secondary | ICD-10-CM

## 2017-09-03 DIAGNOSIS — I959 Hypotension, unspecified: Secondary | ICD-10-CM

## 2017-09-03 LAB — CBC WITH DIFFERENTIAL/PLATELET
Abs Immature Granulocytes: 0 10*3/uL (ref 0.0–0.1)
Basophils Absolute: 0 10*3/uL (ref 0.0–0.1)
Basophils Relative: 1 %
EOS PCT: 5 %
Eosinophils Absolute: 0.3 10*3/uL (ref 0.0–0.7)
HEMATOCRIT: 39.5 % (ref 36.0–46.0)
HEMOGLOBIN: 12.1 g/dL (ref 12.0–15.0)
Immature Granulocytes: 1 %
LYMPHS ABS: 0.7 10*3/uL (ref 0.7–4.0)
LYMPHS PCT: 11 %
MCH: 29 pg (ref 26.0–34.0)
MCHC: 30.6 g/dL (ref 30.0–36.0)
MCV: 94.7 fL (ref 78.0–100.0)
MONOS PCT: 7 %
Monocytes Absolute: 0.5 10*3/uL (ref 0.1–1.0)
Neutro Abs: 4.9 10*3/uL (ref 1.7–7.7)
Neutrophils Relative %: 75 %
Platelets: 194 10*3/uL (ref 150–400)
RBC: 4.17 MIL/uL (ref 3.87–5.11)
RDW: 15 % (ref 11.5–15.5)
WBC: 6.4 10*3/uL (ref 4.0–10.5)

## 2017-09-03 LAB — BASIC METABOLIC PANEL
Anion gap: 10 (ref 5–15)
BUN: 25 mg/dL — ABNORMAL HIGH (ref 8–23)
CHLORIDE: 111 mmol/L (ref 98–111)
CO2: 20 mmol/L — AB (ref 22–32)
CREATININE: 1.35 mg/dL — AB (ref 0.44–1.00)
Calcium: 8.7 mg/dL — ABNORMAL LOW (ref 8.9–10.3)
GFR calc non Af Amer: 36 mL/min — ABNORMAL LOW (ref >60)
GFR, EST AFRICAN AMERICAN: 42 mL/min — AB (ref >60)
Glucose, Bld: 149 mg/dL — ABNORMAL HIGH (ref 70–99)
Potassium: 4.3 mmol/L (ref 3.5–5.1)
Sodium: 141 mmol/L (ref 135–145)

## 2017-09-03 MED ORDER — METOPROLOL TARTRATE 50 MG PO TABS
50.0000 mg | ORAL_TABLET | Freq: Two times a day (BID) | ORAL | Status: DC
  Administered 2017-09-03 – 2017-09-09 (×12): 50 mg via ORAL
  Filled 2017-09-03 (×12): qty 1

## 2017-09-03 MED ORDER — METOPROLOL TARTRATE 25 MG PO TABS
25.0000 mg | ORAL_TABLET | Freq: Once | ORAL | Status: AC
  Administered 2017-09-03: 25 mg via ORAL
  Filled 2017-09-03: qty 1

## 2017-09-03 MED ORDER — DILTIAZEM HCL 25 MG/5ML IV SOLN
5.0000 mg | Freq: Once | INTRAVENOUS | Status: AC
  Administered 2017-09-03: 5 mg via INTRAVENOUS
  Filled 2017-09-03: qty 5

## 2017-09-03 MED ORDER — FUROSEMIDE 10 MG/ML IJ SOLN
40.0000 mg | Freq: Every day | INTRAMUSCULAR | Status: DC
  Administered 2017-09-03: 40 mg via INTRAVENOUS
  Filled 2017-09-03: qty 4

## 2017-09-03 NOTE — Progress Notes (Signed)
PROGRESS NOTE    Brenda Matthews  WCB:[id number] DOB: [birth year] DOA: 08/29/2017 PCP: [name], MD    Brief Narrative: Brenda Matthews is a very pleasant 82 y.o. female with medical history significant for paroxysmal A. Fib on anticoagulation, hypertension, hyperlipidemia, chronic renal insufficiency stage II, autoimmune hepatitis presents to the emergency department with the chief complaint of chest pain shortness of breath generalized weakness. Initial evaluation reveals max temp of 100.6, EKG with atrial fibrillation and heart rate of 106, acute kidney injury. [facility] are asked to admit  Information is obtained from the patient and the chart. She states she was in her usual state of health until this morning she awakened with chest discomfort. She describes this discomfort as a "pressure" and is located across the chest just below clavicles on left and right. She says it's worse when she talked and when she touches that area of her chest. Associated symptoms include generalized weakness shortness of breath moist sounding but nonproductive cough. She states over the last couple of weeks her heart rhythm has been going "in and out of A. Fib". She has had her metoprolol adjusted and her Lasix adjusted. She states she had an appointment for cardioversion but her rhythm converted on its own so that was canceled. He denies any feeling of palpitations headache dizziness syncope or near-syncope. She denies nausea vomiting diarrhea constipation melena bright red blood per rectum. She denies dysuria hematuria frequency or urgency. She denies lower extremity edema or arthropathy at.  Patient was transfer to ICU overnight due to hypotension. She received IV bolus, started on neo/. Now off pressors. Diagnosed with PNA. Started on ceftriaxone. Renal function improving. HR elevated, resume metoprolol.  Transfer to step down unit.     Assessment & Plan:   Principal Problem:   Sepsis  ([place]) Active Problems:   Hyperlipidemia   Essential hypertension   Chronic renal insufficiency, stage II (mild)   Bronchopneumonia   PAF (paroxysmal atrial fibrillation) (HCC)   Chronic anticoagulation   Chest pain   DOE (dyspnea on exertion)   Arterial hypotension   Acute-on-chronic kidney injury (HCC)   Rash in adult   Atrial fibrillation and flutter (HCC)   Non-ST elevation (NSTEMI) myocardial infarction ([place])   1-Hypotension; Diferrential cardiogenic vs sepsis.  Received IV bolus. Was on neo for one overnight.  Blood cultures. No growth.  Pro-calcitonin and lactic acid normal.  CCM was consulted and sign off.   Chest x ray with PNA, started  IV antibiotics. Ceftriaxone    2-Dyspnea; EKG changes, mild elevation troponin.  Cardiology consulted. Underwent cath; no obstructive  Worsening hypoxemia overnight. Worsening dyspnea. Repeated chest x ray stable. Persistent atelectasis infiltrates. CT chest with small pleural effusion, left LE consolidation. Reviewed with Dr Chase Caller. He recommend IV lasix, incentive spirometry and complete course of antibiotics,  Needs repeat CT chest in 3-to 6 months. And x ray in one month.  Improved, now on 3 L.  Continue with IV lasix. Monitor renal function   3-Pneumonia;  Continue with  IV ceftriaxone.  Sputum culture,.  Strept Pneumonia negative.    -Rash; Dr Chase Caller consulted surgery. Vasculitis panel RF 22, vasculitis panel negative Rash improving, hold on biopsy.  Suspect related to thrombocytopenia.   4-Paroxysmal A fib;  Continue with amiodarone and xarelto.  Resume metoprolol, now that BP has improved.  Cardiology re-consulted  5-HTN; hold losartan.   6-Hyperlipidemia; continue with statins.   7-Acute on chronic renal failure stage II; Avoid hypotension.  Renal US negative for hydronephrosis.  Stable.   8-History of NASH;     DVT prophylaxis:on xarelto.  Code Status: Full code.  Family Communication: care  discussed with patient.  Disposition Plan: remain in the ICU   Consultants:   Cardiology  CCM   Procedures:   Antimicrobials: Ceftriaxone 8-14  Subjective: She is feeling better than yesterday. Report 2 loose BM Breathing better     Objective: Vitals:   09/03/17 0328 09/03/17 0744 09/03/17 0835 09/03/17 0837  BP: (!) 144/94 116/79    Pulse:  (!) 118    Resp: (!) 36 (!) 25    Temp: 97.7 F (36.5 C) 98.1 F (36.7 C)    TempSrc: Oral Oral    SpO2: 97% 98%  96%  Weight:   84.3 kg   Height:        Intake/Output Summary (Last 24 hours) at 09/03/2017 0838 Last data filed at 09/02/2017 2154 Gross per 24 hour  Intake 850 ml  Output 900 ml  Net -50 ml   Filed Weights   08/31/17 1423 09/02/17 1756 09/03/17 0835  Weight: 80 kg 84.6 kg 84.3 kg    Examination:  General exam: NAD Respiratory system: Crackles bases Cardiovascular system: S 1, S 2 RRR Gastrointestinal system: BS present, soft, nt Central nervous system; non focal.  Extremities: symmetric power. .  Skin; rash improved  Data Reviewed: I have personally reviewed following labs and imaging studies  CBC: Recent Labs  Lab 08/29/17 2210 08/30/17 0521 08/31/17 0018 09/01/17 0054 09/02/17 0336 09/03/17 0253  WBC 10.4 10.1 9.5 7.6 6.3 6.4  NEUTROABS 8.6*  --  7.4 5.8 4.7 4.9  HGB 12.3 12.2 12.8 12.3 11.6* 12.1  HCT 39.2 39.6 41.0 38.7 37.4 39.5  MCV 91.8 93.0 91.9 91.9 93.5 94.7  PLT 121* 131* 113* 154 152 644   Basic Metabolic Panel: Recent Labs  Lab 08/30/17 0034 08/30/17 0521 08/30/17 1831 08/31/17 0018 09/01/17 0054 09/02/17 0336 09/03/17 0253  NA  --  139 136 137 133* 139 141  K  --  3.8 3.6 3.5 3.7 3.6 4.3  CL  --  106 103 103 105 110 111  CO2  --  22 22 19* 20* 19* 20*  GLUCOSE  --  130* 136* 119* 168* 140* 149*  BUN  --  33* 33* 33* 29* 27* 25*  CREATININE  --  2.24* 2.07* 1.92* 1.47* 1.28* 1.35*  CALCIUM  --  7.6* 7.9* 8.0* 7.9* 8.1* 8.7*  MG 2.0  --   --  2.0  --   --   --    PHOS  --  3.1  --  2.9  --   --   --    GFR: Estimated Creatinine Clearance: 33.5 mL/min (A) (by C-G formula based on SCr of 1.35 mg/dL (H)). Liver Function Tests: Recent Labs  Lab 08/29/17 2210 08/30/17 0521  AST 20  --   ALT 26  --   ALKPHOS 53  --   BILITOT 1.0  --   PROT 5.3*  --   ALBUMIN 2.6* 2.6*   No results for input(s): LIPASE, AMYLASE in the last 168 hours. No results for input(s): AMMONIA in the last 168 hours. Coagulation Profile: Recent Labs  Lab 08/29/17 1145  INR 1.65   Cardiac Enzymes: Recent Labs  Lab 08/29/17 2217 08/30/17 0521 08/30/17 1109 08/31/17 0018 08/31/17 1239  TROPONINI <0.03 0.04* 0.04* 0.40* 0.11*   BNP (last 3 results) Recent Labs  08/01/17 1017 08/04/17 1447  PROBNP 631.0* 574.0*   HbA1C: No results for input(s): HGBA1C in the last 72 hours. CBG: Recent Labs  Lab 08/29/17 2330  GLUCAP 124*   Lipid Profile: No results for input(s): CHOL, HDL, LDLCALC, TRIG, CHOLHDL, LDLDIRECT in the last 72 hours. Thyroid Function Tests: No results for input(s): TSH, T4TOTAL, FREET4, T3FREE, THYROIDAB in the last 72 hours. Anemia Panel: No results for input(s): VITAMINB12, FOLATE, FERRITIN, TIBC, IRON, RETICCTPCT in the last 72 hours. Sepsis Labs: Recent Labs  Lab 08/29/17 1436 08/29/17 1458 08/29/17 2210 08/30/17 0034 08/30/17 1109 08/31/17 0018 09/01/17 0054  PROCALCITON  --  0.21  --   --  1.46 1.18 0.61  LATICACIDVEN 0.66  --  1.2 1.7  --   --   --     Recent Results (from the past 240 hour(s))  Respiratory Panel by PCR     Status: None   Collection Time: 08/29/17  4:30 PM  Result Value Ref Range Status   Adenovirus NOT DETECTED NOT DETECTED Final   Coronavirus 229E NOT DETECTED NOT DETECTED Final   Coronavirus HKU1 NOT DETECTED NOT DETECTED Final   Coronavirus NL63 NOT DETECTED NOT DETECTED Final   Coronavirus OC43 NOT DETECTED NOT DETECTED Final   Metapneumovirus NOT DETECTED NOT DETECTED Final   Rhinovirus /  Enterovirus NOT DETECTED NOT DETECTED Final   Influenza A NOT DETECTED NOT DETECTED Final   Influenza B NOT DETECTED NOT DETECTED Final   Parainfluenza Virus 1 NOT DETECTED NOT DETECTED Final   Parainfluenza Virus 2 NOT DETECTED NOT DETECTED Final   Parainfluenza Virus 3 NOT DETECTED NOT DETECTED Final   Parainfluenza Virus 4 NOT DETECTED NOT DETECTED Final   Respiratory Syncytial Virus NOT DETECTED NOT DETECTED Final   Bordetella pertussis NOT DETECTED NOT DETECTED Final   Chlamydophila pneumoniae NOT DETECTED NOT DETECTED Final   Mycoplasma pneumoniae NOT DETECTED NOT DETECTED Final    Comment: Performed at [facility], [place] [address], [place], [place] [address]  MRSA PCR Screening     Status: None   Collection Time: 08/29/17 11:29 PM  Result Value Ref Range Status   MRSA by PCR NEGATIVE NEGATIVE Final    Comment:        The GeneXpert MRSA Assay (FDA approved for NASAL specimens only), is one component of a comprehensive MRSA colonization surveillance program. It is not intended to diagnose MRSA infection nor to guide or monitor treatment for MRSA infections. Performed at [facility], [place] [address] [place], [place] [address]   Culture, blood (routine x 2)     Status: None (Preliminary result)   Collection Time: 08/30/17 12:25 AM  Result Value Ref Range Status   Specimen Description BLOOD LEFT HAND  Final   Special Requests   Final    BOTTLES DRAWN AEROBIC AND ANAEROBIC Blood Culture adequate volume   Culture   Final    NO GROWTH 3 DAYS Performed at [facility], [place] [address] [place], [place] [address]    Report Status PENDING  Incomplete  Culture, blood (routine x 2)     Status: None (Preliminary result)   Collection Time: 08/30/17 12:30 AM  Result Value Ref Range Status   Specimen Description BLOOD RIGHT ANTECUBITAL  Final   Special Requests   Final    BOTTLES DRAWN AEROBIC ONLY Blood Culture adequate volume   Culture   Final    NO GROWTH 3  DAYS Performed at [facility], [place] [address], [place], [place] [address]    Report Status PENDING  Incomplete         Radiology Studies: Ct Chest Wo Contrast  Result Date: 09/03/2017 CLINICAL DATA:  Post therapeutic collapse of lung status. EXAM: CT CHEST WITHOUT CONTRAST TECHNIQUE: Multidetector CT imaging of the chest was performed following the standard protocol without IV contrast. COMPARISON:  Chest radiograph 09/02/2017 FINDINGS: Cardiovascular: Cardiac enlargement with small pericardial effusion. Coronary artery calcifications. Normal caliber thoracic aorta with aortic calcification. Mediastinum/Nodes: Esophagus is decompressed. Mediastinal lymph nodes are prominent without pathologic enlargement, measuring up to about 9 mm short axis dimension. Probably reactive. Lungs/Pleura: Evaluation is limited due to motion artifact. Bilateral pleural effusions with basilar atelectasis. No consolidation otherwise demonstrated in the lungs. No pneumothorax. Airways are patent. Upper Abdomen: Large low-attenuation lesion in the right lobe of the liver as not well characterized on noncontrast imaging but probably represents a cyst. Consider ultrasound for correlation if clinically indicated. Musculoskeletal: Degenerative changes in the spine. No destructive bone lesions. IMPRESSION: Cardiac enlargement with small pericardial effusion. Bilateral pleural effusions with basilar atelectasis. Large low-attenuation lesion in the right lobe of the liver probably a cyst. This could be confirmed at ultrasound. Aortic atherosclerosis. Electronically Signed   By: Lucienne Capers M.D.   On: 09/03/2017 04:28   Dg Chest Port 1 View  Result Date: 09/02/2017 CLINICAL DATA:  Hypoxia. EXAM: PORTABLE CHEST 1 VIEW COMPARISON:  Chest x-ray from yesterday. FINDINGS: Stable cardiomegaly. Normal pulmonary vascularity. Small bilateral pleural effusions and patchy bibasilar airspace disease are similar to prior study. No  pneumothorax. No acute osseous abnormality. IMPRESSION: 1. Unchanged small bilateral pleural effusions and patchy bibasilar airspace disease. Electronically Signed   By: Titus Dubin M.D.   On: 09/02/2017 08:22        Scheduled Meds: . amiodarone  200 mg Oral BID  . atorvastatin  40 mg Oral q1800  . dextromethorphan-guaiFENesin  1 tablet Oral BID  . fluticasone  2 spray Each Nare Daily  . furosemide  40 mg Intravenous Daily  . ipratropium-albuterol  3 mL Nebulization BID  . mouth rinse  15 mL Mouth Rinse BID  . metoprolol tartrate  25 mg Oral BID  . rivaroxaban  15 mg Oral Q supper  . sodium chloride flush  3 mL Intravenous Q12H   Continuous Infusions: . sodium chloride    . cefTRIAXone (ROCEPHIN)  IV 1 g (09/02/17 0946)     LOS: 4 days    Time spent: 35 minutes.     Elmarie Shiley, MD [facility] Pager [phone]  If 7PM-7AM, please contact night-coverage [email or web address] Password [facility] 09/03/2017, 8:38 AM

## 2017-09-03 NOTE — Progress Notes (Signed)
Progress Note  Patient Name: Brenda Matthews Date of Encounter: 09/03/2017  Primary Cardiologist: [name], MD   Subjective   Asked by Dr. Dennison Nancy to reevaluate this patient who has had some rapid atrial fibrillation and hypoxia.  She had a chest CT performed earlier this morning.  She has some exertional dyspnea.  She has several questions about medications and studies.  2 neighbors are present in the room.  Inpatient Medications    Scheduled Meds: . amiodarone  200 mg Oral BID  . atorvastatin  40 mg Oral q1800  . dextromethorphan-guaiFENesin  1 tablet Oral BID  . fluticasone  2 spray Each Nare Daily  . furosemide  40 mg Intravenous Daily  . ipratropium-albuterol  3 mL Nebulization BID  . mouth rinse  15 mL Mouth Rinse BID  . metoprolol tartrate  25 mg Oral BID  . rivaroxaban  15 mg Oral Q supper  . sodium chloride flush  3 mL Intravenous Q12H   Continuous Infusions: . sodium chloride    . cefTRIAXone (ROCEPHIN)  IV 1 g (09/03/17 0951)   PRN Meds: sodium chloride, acetaminophen, albuterol, ALPRAZolam, benzonatate, gi cocktail, guaiFENesin-dextromethorphan, ondansetron (ZOFRAN) IV, sodium chloride flush   Vital Signs    Vitals:   09/03/17 0328 09/03/17 0744 09/03/17 0835 09/03/17 0837  BP: (!) 144/94 116/79    Pulse:  (!) 118    Resp: (!) 36 (!) 25    Temp: 97.7 F (36.5 C) 98.1 F (36.7 C)    TempSrc: Oral Oral    SpO2: 97% 98%  96%  Weight:   84.3 kg   Height:        Intake/Output Summary (Last 24 hours) at 09/03/2017 1243 Last data filed at 09/03/2017 0957 Gross per 24 hour  Intake 728 ml  Output 250 ml  Net 478 ml   Filed Weights   08/31/17 1423 09/02/17 1756 09/03/17 0835  Weight: 80 kg 84.6 kg 84.3 kg    Telemetry    Atrial fibrillation 100-110 bpm range- Personally Reviewed  ECG    No new tracings- Personally Reviewed  Physical Exam   GEN: No acute distress.   Neck: No JVD Cardiac:  Irregular rhythm, no murmurs, rubs, or gallops.    Respiratory:  Diminished sounds at bases with bilateral crackles. GI: Soft, nontender, non-distended  MS: No edema; No deformity. Neuro:  Nonfocal  Psych: Normal affect   Labs    Chemistry Recent Labs  Lab 08/29/17 2210 08/30/17 0521  09/01/17 0054 09/02/17 0336 09/03/17 0253  NA 136 139   < > 133* 139 141  K 3.4* 3.8   < > 3.7 3.6 4.3  CL 106 106   < > 105 110 111  CO2 21* 22   < > 20* 19* 20*  GLUCOSE 155* 130*   < > 168* 140* 149*  BUN 28* 33*   < > 29* 27* 25*  CREATININE 1.91* 2.24*   < > 1.47* 1.28* 1.35*  CALCIUM 7.4* 7.6*   < > 7.9* 8.1* 8.7*  PROT 5.3*  --   --   --   --   --   ALBUMIN 2.6* 2.6*  --   --   --   --   AST 20  --   --   --   --   --   ALT 26  --   --   --   --   --   ALKPHOS 53  --   --   --   --   --  BILITOT 1.0  --   --   --   --   --   GFRNONAA 24* 20*   < > 32* 38* 36*  GFRAA 27* 23*   < > 38* 45* 42*  ANIONGAP 9 11   < > 8 10 10    < > = values in this interval not displayed.     Hematology Recent Labs  Lab 09/01/17 0054 09/02/17 0336 09/03/17 0253  WBC 7.6 6.3 6.4  RBC 4.21 4.00 4.17  HGB 12.3 11.6* 12.1  HCT 38.7 37.4 39.5  MCV 91.9 93.5 94.7  MCH 29.2 29.0 29.0  MCHC 31.8 31.0 30.6  RDW 15.0 14.8 15.0  PLT 154 152 194    Cardiac Enzymes Recent Labs  Lab 08/30/17 0521 08/30/17 1109 08/31/17 0018 08/31/17 1239  TROPONINI 0.04* 0.04* 0.40* 0.11*    Recent Labs  Lab 08/29/17 1151 08/29/17 1433  TROPIPOC 0.00 0.01     BNP Recent Labs  Lab 08/29/17 2210 09/02/17 0336  BNP 1,028.9* 1,314.7*     DDimer No results for input(s): DDIMER in the last 168 hours.   Radiology    Ct Chest Wo Contrast  Result Date: 09/03/2017 CLINICAL DATA:  Post therapeutic collapse of lung status. EXAM: CT CHEST WITHOUT CONTRAST TECHNIQUE: Multidetector CT imaging of the chest was performed following the standard protocol without IV contrast. COMPARISON:  Chest radiograph 09/02/2017 FINDINGS: Cardiovascular: Cardiac enlargement with  small pericardial effusion. Coronary artery calcifications. Normal caliber thoracic aorta with aortic calcification. Mediastinum/Nodes: Esophagus is decompressed. Mediastinal lymph nodes are prominent without pathologic enlargement, measuring up to about 9 mm short axis dimension. Probably reactive. Lungs/Pleura: Evaluation is limited due to motion artifact. Bilateral pleural effusions with basilar atelectasis. No consolidation otherwise demonstrated in the lungs. No pneumothorax. Airways are patent. Upper Abdomen: Large low-attenuation lesion in the right lobe of the liver as not well characterized on noncontrast imaging but probably represents a cyst. Consider ultrasound for correlation if clinically indicated. Musculoskeletal: Degenerative changes in the spine. No destructive bone lesions. IMPRESSION: Cardiac enlargement with small pericardial effusion. Bilateral pleural effusions with basilar atelectasis. Large low-attenuation lesion in the right lobe of the liver probably a cyst. This could be confirmed at ultrasound. Aortic atherosclerosis. Electronically Signed   By: Lucienne Capers M.D.   On: 09/03/2017 04:28   Dg Chest Port 1 View  Result Date: 09/02/2017 CLINICAL DATA:  Hypoxia. EXAM: PORTABLE CHEST 1 VIEW COMPARISON:  Chest x-ray from yesterday. FINDINGS: Stable cardiomegaly. Normal pulmonary vascularity. Small bilateral pleural effusions and patchy bibasilar airspace disease are similar to prior study. No pneumothorax. No acute osseous abnormality. IMPRESSION: 1. Unchanged small bilateral pleural effusions and patchy bibasilar airspace disease. Electronically Signed   By: Titus Dubin M.D.   On: 09/02/2017 08:22    Cardiac Studies   Echocardiogram 08/10/2017:  Study Conclusions  - Left ventricle: The cavity size was normal. Wall thickness was   increased in a pattern of mild LVH. Systolic function was normal.   The estimated ejection fraction was in the range of 50% to 55%.   Wall  motion was normal; there were no regional wall motion   abnormalities. - Aortic valve: There was trivial regurgitation. - Aortic root: The aortic root was mildly dilated. - Mitral valve: There was mild regurgitation. - Left atrium: The atrium was severely dilated. - Right atrium: The atrium was mildly dilated. - Tricuspid valve: There was mild-moderate regurgitation. - Pulmonary arteries: PA peak pressure: 32 mm Hg (S). - Pericardium,  extracardiac: A trivial pericardial effusion was   identified.  Impressions:  - Normal LV systolic function; mild LVH; trace AI; mildly dilated   aortic root; mild MR; biatrial enlargement; mild to moderate TR.  Coronary angiography 09/01/2017:  Conclusions: 1. No angiographically significant coronary artery disease. 2. Upper normal left ventricular filling pressure.     Patient Profile     82 y.o. female   [place]    1.  Rapid atrial fibrillation: She has been recently restarted on metoprolol tartrate 25 mg twice daily.  She has some exertional dyspnea which is multifactorial including pleural effusions, atelectasis, and rapid atrial fibrillation.  I will increase metoprolol to 50 mg twice daily.  It appears she had been taking Toprol-XL 75 mg daily prior to admission.  As per note review, there are plans for outpatient direct-current cardioversion by Dr. Debara Pickett.  She is also on amiodarone 200 mg twice daily.  She is being anticoagulated with renally dosed Xarelto.  Will reevaluate tomorrow.  Could consider inpatient cardioversion if deemed necessary.  2.  Exertional dyspnea with hypoxia/acute on chronic diastolic heart failure.: Chest CT showed bilateral pleural effusions with basilar atelectasis.  She has been started on IV Lasix 40 mg daily by internal medicine.  Lasix was initially held prior to cardiac catheterization due to hypotension.  BNP 1314 yesterday.  Weight was recorded at 75.8 kg on 8/13 and is up to 84.3 kg today.  Continue IV  Lasix 40 mg daily with close monitoring of renal function and blood pressure as she had been hypotensive on admission.  IV Lasix was just administered at 950 this morning and thus will await total output recording.  3.  Pneumonia: Currently on IV ceftriaxone.  4.  Hypertension: Blood pressure is presently normal.    For questions or updates, please contact [place] Please consult [email or web address] for contact info under Cardiology/STEMI.      Signed, Kate Sable, MD  09/03/2017, 12:43 PM

## 2017-09-04 ENCOUNTER — Inpatient Hospital Stay (HOSPITAL_COMMUNITY): Payer: Medicare Other

## 2017-09-04 LAB — BASIC METABOLIC PANEL
Anion gap: 9 (ref 5–15)
BUN: 26 mg/dL — AB (ref 8–23)
CALCIUM: 8.7 mg/dL — AB (ref 8.9–10.3)
CO2: 21 mmol/L — AB (ref 22–32)
CREATININE: 1.21 mg/dL — AB (ref 0.44–1.00)
Chloride: 111 mmol/L (ref 98–111)
GFR calc non Af Amer: 41 mL/min — ABNORMAL LOW (ref >60)
GFR, EST AFRICAN AMERICAN: 48 mL/min — AB (ref >60)
GLUCOSE: 105 mg/dL — AB (ref 70–99)
Potassium: 4.2 mmol/L (ref 3.5–5.1)
Sodium: 141 mmol/L (ref 135–145)

## 2017-09-04 LAB — CBC
HCT: 36.8 % (ref 36.0–46.0)
Hemoglobin: 11.4 g/dL — ABNORMAL LOW (ref 12.0–15.0)
MCH: 29.1 pg (ref 26.0–34.0)
MCHC: 31 g/dL (ref 30.0–36.0)
MCV: 93.9 fL (ref 78.0–100.0)
PLATELETS: 227 10*3/uL (ref 150–400)
RBC: 3.92 MIL/uL (ref 3.87–5.11)
RDW: 15.2 % (ref 11.5–15.5)
WBC: 6.6 10*3/uL (ref 4.0–10.5)

## 2017-09-04 LAB — CULTURE, BLOOD (ROUTINE X 2)
CULTURE: NO GROWTH
Culture: NO GROWTH
SPECIAL REQUESTS: ADEQUATE
Special Requests: ADEQUATE

## 2017-09-04 MED ORDER — FUROSEMIDE 10 MG/ML IJ SOLN
40.0000 mg | Freq: Three times a day (TID) | INTRAMUSCULAR | Status: DC
  Administered 2017-09-04 – 2017-09-06 (×7): 40 mg via INTRAVENOUS
  Filled 2017-09-04 (×6): qty 4

## 2017-09-04 MED ORDER — AZITHROMYCIN 500 MG PO TABS
500.0000 mg | ORAL_TABLET | Freq: Every day | ORAL | Status: DC
  Administered 2017-09-04 – 2017-09-07 (×4): 500 mg via ORAL
  Filled 2017-09-04 (×4): qty 1

## 2017-09-04 MED ORDER — FUROSEMIDE 10 MG/ML IJ SOLN
40.0000 mg | Freq: Two times a day (BID) | INTRAMUSCULAR | Status: DC
  Filled 2017-09-04: qty 4

## 2017-09-04 MED FILL — Midazolam HCl Inj 2 MG/2ML (Base Equivalent): INTRAMUSCULAR | Qty: 2 | Status: AC

## 2017-09-04 MED FILL — Verapamil HCl IV Soln 2.5 MG/ML: INTRAVENOUS | Qty: 2 | Status: AC

## 2017-09-04 MED FILL — Fentanyl Citrate Preservative Free (PF) Inj 100 MCG/2ML: INTRAMUSCULAR | Qty: 2 | Status: AC

## 2017-09-04 NOTE — Progress Notes (Signed)
Notified practitioner of CXR results.

## 2017-09-04 NOTE — Progress Notes (Signed)
PROGRESS NOTE    SALA TAGUE  PYK:[id number] DOB: Jul 26, 1935 DOA: 08/29/2017 PCP: [name], MD    Brief Narrative: Brenda Matthews is a very pleasant 82 y.o. female with medical history significant for paroxysmal A. Fib on anticoagulation, hypertension, hyperlipidemia, chronic renal insufficiency stage II, autoimmune hepatitis presents to the emergency department with the chief complaint of chest pain shortness of breath generalized weakness. Initial evaluation reveals max temp of 100.6, EKG with atrial fibrillation and heart rate of 106, acute kidney injury. [facility] are asked to admit  Information is obtained from the patient and the chart. She states she was in her usual state of health until this morning she awakened with chest discomfort. She describes this discomfort as a "pressure" and is located across the chest just below clavicles on left and right. She says it's worse when she talked and when she touches that area of her chest. Associated symptoms include generalized weakness shortness of breath moist sounding but nonproductive cough. She states over the last couple of weeks her heart rhythm has been going "in and out of A. Fib". She has had her metoprolol adjusted and her Lasix adjusted. She states she had an appointment for cardioversion but her rhythm converted on its own so that was canceled. He denies any feeling of palpitations headache dizziness syncope or near-syncope. She denies nausea vomiting diarrhea constipation melena bright red blood per rectum. She denies dysuria hematuria frequency or urgency. She denies lower extremity edema or arthropathy at.  Patient was transfer to ICU overnight due to hypotension. She received IV bolus, started on neo/. Now off pressors. Diagnosed with PNA. Started on ceftriaxone. Renal function improving. HR elevated, resume metoprolol.  Transfer to step down unit.     Assessment & Plan:   Principal Problem:   Sepsis  ([place]) Active Problems:   Hyperlipidemia   Essential hypertension   Chronic renal insufficiency, stage II (mild)   Bronchopneumonia   PAF (paroxysmal atrial fibrillation) (HCC)   Chronic anticoagulation   Chest pain   DOE (dyspnea on exertion)   Arterial hypotension   Acute-on-chronic kidney injury (HCC)   Rash in adult   Atrial fibrillation and flutter (HCC)   Non-ST elevation (NSTEMI) myocardial infarction ([place])   1-Hypotension; Diferrential cardiogenic vs sepsis.  Received IV bolus. Was on neo for one overnight.  Blood cultures. No growth.  Pro-calcitonin and lactic acid normal.  CCM was consulted and sign off.   Chest x ray with PNA, started  IV antibiotics. Ceftriaxone    2-Acute Hypoxic Respiratory failure Dyspnea; EKG changes, mild elevation troponin.  -Cardiology consulted. Underwent cath; no obstructive  -8-17;Worsening hypoxemia and dyspnea. Repeated chest x ray stable. Persistent atelectasis infiltrates. CT chest with small pleural effusion, left LE consolidation. Reviewed with Dr Chase Caller. He recommend IV lasix, incentive spirometry and complete course of antibiotics,  Needs repeat CT chest in 3-to 6 months. And x ray in one month.  -8-18 improved dyspnea.  -8-19; increase work of breathing, worsening dyspnea. Repeated chest x ray worsening bilateral pleural effusion and atelectasis.  -Will change lasix to 40 mg IV every 8 hour. Repeat renal function in am.  -could be related to fluids post resuscitation, vs A fib causing HF. Cardiology following.  -will ask pulmonary evaluation.   3-Pneumonia;  Continue with  IV ceftriaxone. Day 5. Will add azithromycin. No improvement.  Strept Pneumonia negative.    -Rash; Dr Chase Caller consulted surgery. Vasculitis panel RF 22, vasculitis panel negative Rash improving, hold  on biopsy.  Suspect related to thrombocytopenia.   4-Paroxysmal A fib;  Continue with amiodarone and xarelto.  Resume metoprolol, now that BP has  improved.  Cardiology re-consulted  5-HTN; hold losartan.   6-Hyperlipidemia; continue with statins.   7-Acute on chronic renal failure stage II; Avoid hypotension.  Renal US negative for hydronephrosis.  Stable.   8-History of NASH;     DVT prophylaxis:on xarelto.  Code Status: Full code.  Family Communication: care discussed with patient.  Disposition Plan: remain in the ICU   Consultants:   Cardiology  CCM   Procedures:   Antimicrobials: Ceftriaxone 8-14  Subjective: Worsening dyspnea and work of breathing overnight.  She is now sitting in recliner, appears tired. She is not on any distress. Still on 3 L oxygen.     Objective: Vitals:   09/03/17 2013 09/04/17 0428 09/04/17 0441 09/04/17 0450  BP:      Pulse:      Resp:   (!) 36   Temp:      TempSrc:      SpO2: 97%  95% 97%  Weight:  82.9 kg    Height:        Intake/Output Summary (Last 24 hours) at 09/04/2017 0728 Last data filed at 09/04/2017 0428 Gross per 24 hour  Intake 118 ml  Output 1550 ml  Net -1432 ml   Filed Weights   09/02/17 1756 09/03/17 0835 09/04/17 0428  Weight: 84.6 kg 84.3 kg 82.9 kg    Examination:  General exam:NAD Respiratory system: Bilateral crackles.  Cardiovascular system: S 1, S 2 RRR Gastrointestinal system: BS p, present, soft, nt Central nervous system; Non focal.  Extremities: Symmetric power.  Skin; rash improved  Data Reviewed: I have personally reviewed following labs and imaging studies  CBC: Recent Labs  Lab 08/29/17 2210 08/30/17 0521 08/31/17 0018 09/01/17 0054 09/02/17 0336 09/03/17 0253  WBC 10.4 10.1 9.5 7.6 6.3 6.4  NEUTROABS 8.6*  --  7.4 5.8 4.7 4.9  HGB 12.3 12.2 12.8 12.3 11.6* 12.1  HCT 39.2 39.6 41.0 38.7 37.4 39.5  MCV 91.8 93.0 91.9 91.9 93.5 94.7  PLT 121* 131* 113* 154 152 509   Basic Metabolic Panel: Recent Labs  Lab 08/30/17 0034 08/30/17 0521  08/31/17 0018 09/01/17 0054 09/02/17 0336 09/03/17 0253 09/04/17 0422   NA  --  139   < > 137 133* 139 141 141  K  --  3.8   < > 3.5 3.7 3.6 4.3 4.2  CL  --  106   < > 103 105 110 111 111  CO2  --  22   < > 19* 20* 19* 20* 21*  GLUCOSE  --  130*   < > 119* 168* 140* 149* 105*  BUN  --  33*   < > 33* 29* 27* 25* 26*  CREATININE  --  2.24*   < > 1.92* 1.47* 1.28* 1.35* 1.21*  CALCIUM  --  7.6*   < > 8.0* 7.9* 8.1* 8.7* 8.7*  MG 2.0  --   --  2.0  --   --   --   --   PHOS  --  3.1  --  2.9  --   --   --   --    < > = values in this interval not displayed.   GFR: Estimated Creatinine Clearance: 37 mL/min (A) (by C-G formula based on SCr of 1.21 mg/dL (H)). Liver Function Tests: Recent Labs  Lab 08/29/17  2210 08/30/17 0521  AST 20  --   ALT 26  --   ALKPHOS 53  --   BILITOT 1.0  --   PROT 5.3*  --   ALBUMIN 2.6* 2.6*   No results for input(s): LIPASE, AMYLASE in the last 168 hours. No results for input(s): AMMONIA in the last 168 hours. Coagulation Profile: Recent Labs  Lab 08/29/17 1145  INR 1.65   Cardiac Enzymes: Recent Labs  Lab 08/29/17 2217 08/30/17 0521 08/30/17 1109 08/31/17 0018 08/31/17 1239  TROPONINI <0.03 0.04* 0.04* 0.40* 0.11*   BNP (last 3 results) Recent Labs    08/01/17 1017 08/04/17 1447  PROBNP 631.0* 574.0*   HbA1C: No results for input(s): HGBA1C in the last 72 hours. CBG: Recent Labs  Lab 08/29/17 2330  GLUCAP 124*   Lipid Profile: No results for input(s): CHOL, HDL, LDLCALC, TRIG, CHOLHDL, LDLDIRECT in the last 72 hours. Thyroid Function Tests: No results for input(s): TSH, T4TOTAL, FREET4, T3FREE, THYROIDAB in the last 72 hours. Anemia Panel: No results for input(s): VITAMINB12, FOLATE, FERRITIN, TIBC, IRON, RETICCTPCT in the last 72 hours. Sepsis Labs: Recent Labs  Lab 08/29/17 1436 08/29/17 1458 08/29/17 2210 08/30/17 0034 08/30/17 1109 08/31/17 0018 09/01/17 0054  PROCALCITON  --  0.21  --   --  1.46 1.18 0.61  LATICACIDVEN 0.66  --  1.2 1.7  --   --   --     Recent Results (from the  past 240 hour(s))  Respiratory Panel by PCR     Status: None   Collection Time: 08/29/17  4:30 PM  Result Value Ref Range Status   Adenovirus NOT DETECTED NOT DETECTED Final   Coronavirus 229E NOT DETECTED NOT DETECTED Final   Coronavirus HKU1 NOT DETECTED NOT DETECTED Final   Coronavirus NL63 NOT DETECTED NOT DETECTED Final   Coronavirus OC43 NOT DETECTED NOT DETECTED Final   Metapneumovirus NOT DETECTED NOT DETECTED Final   Rhinovirus / Enterovirus NOT DETECTED NOT DETECTED Final   Influenza A NOT DETECTED NOT DETECTED Final   Influenza B NOT DETECTED NOT DETECTED Final   Parainfluenza Virus 1 NOT DETECTED NOT DETECTED Final   Parainfluenza Virus 2 NOT DETECTED NOT DETECTED Final   Parainfluenza Virus 3 NOT DETECTED NOT DETECTED Final   Parainfluenza Virus 4 NOT DETECTED NOT DETECTED Final   Respiratory Syncytial Virus NOT DETECTED NOT DETECTED Final   Bordetella pertussis NOT DETECTED NOT DETECTED Final   Chlamydophila pneumoniae NOT DETECTED NOT DETECTED Final   Mycoplasma pneumoniae NOT DETECTED NOT DETECTED Final    Comment: Performed at [facility], [place] [address], [place], [place] [address]  MRSA PCR Screening     Status: None   Collection Time: 08/29/17 11:29 PM  Result Value Ref Range Status   MRSA by PCR NEGATIVE NEGATIVE Final    Comment:        The GeneXpert MRSA Assay (FDA approved for NASAL specimens only), is one component of a comprehensive MRSA colonization surveillance program. It is not intended to diagnose MRSA infection nor to guide or monitor treatment for MRSA infections. Performed at [facility], [place] [address], [place], [place] [address]   Culture, blood (routine x 2)     Status: None (Preliminary result)   Collection Time: 08/30/17 12:25 AM  Result Value Ref Range Status   Specimen Description BLOOD LEFT HAND  Final   Special Requests   Final    BOTTLES DRAWN AEROBIC AND ANAEROBIC Blood Culture adequate  volume   Culture   Final     NO GROWTH 4 DAYS Performed at [facility], [place] [address] [place], [place] [address]    Report Status PENDING  Incomplete  Culture, blood (routine x 2)     Status: None (Preliminary result)   Collection Time: 08/30/17 12:30 AM  Result Value Ref Range Status   Specimen Description BLOOD RIGHT ANTECUBITAL  Final   Special Requests   Final    BOTTLES DRAWN AEROBIC ONLY Blood Culture adequate volume   Culture   Final    NO GROWTH 4 DAYS Performed at [facility], [place] [address], [place], [place] [address]    Report Status PENDING  Incomplete         Radiology Studies: Ct Chest Wo Contrast  Result Date: 09/03/2017 CLINICAL DATA:  Post therapeutic collapse of lung status. EXAM: CT CHEST WITHOUT CONTRAST TECHNIQUE: Multidetector CT imaging of the chest was performed following the standard protocol without IV contrast. COMPARISON:  Chest radiograph 09/02/2017 FINDINGS: Cardiovascular: Cardiac enlargement with small pericardial effusion. Coronary artery calcifications. Normal caliber thoracic aorta with aortic calcification. Mediastinum/Nodes: Esophagus is decompressed. Mediastinal lymph nodes are prominent without pathologic enlargement, measuring up to about 9 mm short axis dimension. Probably reactive. Lungs/Pleura: Evaluation is limited due to motion artifact. Bilateral pleural effusions with basilar atelectasis. No consolidation otherwise demonstrated in the lungs. No pneumothorax. Airways are patent. Upper Abdomen: Large low-attenuation lesion in the right lobe of the liver as not well characterized on noncontrast imaging but probably represents a cyst. Consider ultrasound for correlation if clinically indicated. Musculoskeletal: Degenerative changes in the spine. No destructive bone lesions. IMPRESSION: Cardiac enlargement with small pericardial effusion. Bilateral pleural effusions with basilar atelectasis. Large low-attenuation lesion in the right lobe of the liver probably a  cyst. This could be confirmed at ultrasound. Aortic atherosclerosis. Electronically Signed   By: Lucienne Capers M.D.   On: 09/03/2017 04:28   Dg Chest Port 1 View  Result Date: 09/04/2017 CLINICAL DATA:  Shortness of breath EXAM: PORTABLE CHEST 1 VIEW COMPARISON:  09/02/2017 FINDINGS: Cardiac enlargement without pulmonary vascular congestion. Bilateral pleural effusions with basilar atelectasis or infiltration, increasing since prior study. No pneumothorax. Calcification of aorta. IMPRESSION: Increasing bilateral pleural effusions and basilar atelectasis/ infiltration. Cardiac enlargement. Electronically Signed   By: Lucienne Capers M.D.   On: 09/04/2017 05:54   Dg Chest Port 1 View  Result Date: 09/02/2017 CLINICAL DATA:  Hypoxia. EXAM: PORTABLE CHEST 1 VIEW COMPARISON:  Chest x-ray from yesterday. FINDINGS: Stable cardiomegaly. Normal pulmonary vascularity. Small bilateral pleural effusions and patchy bibasilar airspace disease are similar to prior study. No pneumothorax. No acute osseous abnormality. IMPRESSION: 1. Unchanged small bilateral pleural effusions and patchy bibasilar airspace disease. Electronically Signed   By: Titus Dubin M.D.   On: 09/02/2017 08:22        Scheduled Meds: . amiodarone  200 mg Oral BID  . atorvastatin  40 mg Oral q1800  . azithromycin  500 mg Oral Daily  . dextromethorphan-guaiFENesin  1 tablet Oral BID  . fluticasone  2 spray Each Nare Daily  . furosemide  40 mg Intravenous Q8H  . ipratropium-albuterol  3 mL Nebulization BID  . mouth rinse  15 mL Mouth Rinse BID  . metoprolol tartrate  50 mg Oral BID  . rivaroxaban  15 mg Oral Q supper  . sodium chloride flush  3 mL Intravenous Q12H   Continuous Infusions: . sodium chloride    .  cefTRIAXone (ROCEPHIN)  IV 1 g (09/03/17 0951)     LOS: 5 days    Time spent: 35 minutes.     Elmarie Shiley, MD [facility] Pager [phone]  If 7PM-7AM, please contact  night-coverage [email or web address] Password [facility] 09/04/2017, 7:28 AM

## 2017-09-04 NOTE — Progress Notes (Signed)
PULMONARY / CRITICAL CARE MEDICINE   Name: Brenda Matthews MRN: [id number] DOB: 14-Sep-1935    ADMISSION DATE:  08/29/2017   CONSULTATION DATE: 09/04/17  REFERRING MD:   Rudolpho Sevin MD  CHIEF COMPLAINT: Bilateral pleural effusion  HISTORY OF PRESENT ILLNESS:   82 year old with history of atrial fibrillation, CHF, hypertension, hyperlipidemia, chronic kidney disease, autoimmune hepatitis Admitted on 8/13 with chest pain, dyspnea, rapid atrial fibrillation, AKI, sepsis.  She was in the ICU for 1 day for hypotension requiring pressors briefly.  Treated for pneumonia with antibiotics Also seen by cardiology for atrial fibrillation.  Cardiac catheterization on 8/16 showed no significant coronary artery disease and upper normal left ventricular filling pressure. PCCM was consulted on 8/19 for dyspnea, hypoxia and chest x-ray showing bilateral effusions.  PAST MEDICAL HISTORY :  She  has a past medical history of Abnormal EKG (approx 2008), Anxiety, Arthritis, Autoimmune hepatitis ([place]), Cataract, Chronic renal insufficiency, stage II (mild) (12/04/2012), Cirrhosis ([place]), Diverticulosis (2009), Fracture of radial shaft, left, closed (11/16/10), History of kidney stones, adenomatous colonic polyps (2002), Hyperlipidemia, Hypertension, Low TSH level (02/18/2016), NASH (nonalcoholic steatohepatitis), Osteopenia, PAF (paroxysmal atrial fibrillation) ([place]) (02/2016), Peripheral edema, Pneumonia (2015), and Rheumatic fever.  PAST SURGICAL HISTORY: She  has a past surgical history that includes Tonsillectomy; ORIF radial fracture (11/18/10); Cataract extraction w/ intraocular lens implant (2013); Colonoscopy w/ polypectomy (05/2013); DEXA (02/2015); Eye surgery; Appendectomy (1966); Open reduction internal fixation (orif) tibia/fibula fracture (Left, 02/18/2016); transthoracic echocardiogram (02/18/2016; 08/10/17); and LEFT HEART CATH AND CORONARY ANGIOGRAPHY (N/A, 09/01/2017).  Allergies  Allergen Reactions  .  Augmentin [Amoxicillin-Pot Clavulanate] Nausea And Vomiting and Other (See Comments)    "projectile vomiting" Has patient had a PCN reaction causing immediate rash, facial/tongue/throat swelling, SOB or lightheadedness with hypotension:No Has patient had a PCN reaction causing severe rash involving mucus membranes or skin necrosis:No Has patient had a PCN reaction that required hospitalization:No Has patient had a PCN reaction occurring within the last 10 years:Yes If all of the above answers are "NO", then may proceed with Cephalosporin use.     No current facility-administered medications on file prior to encounter.    Current Outpatient Medications on File Prior to Encounter  Medication Sig  . acetaminophen (TYLENOL) 325 MG tablet Take 650 mg by mouth every 6 (six) hours as needed for moderate pain or headache.   . alendronate (FOSAMAX) 70 MG tablet Take 1 tablet (70 mg total) by mouth every 7 (seven) days. Take with a full glass of water on an empty stomach. (Patient taking differently: Take 70 mg by mouth every Monday. Take with a full glass of water on an empty stomach.)  . ALPRAZolam (XANAX) 0.5 MG tablet TAKE 1 TABLET BY MOUTH THREE TIMES DAILY IF NEEDED FOR STRESS (Patient taking differently: Take 0.5 mg by mouth 3 (three) times daily as needed for anxiety. )  . Apoaequorin (PREVAGEN) 10 MG CAPS Take 10 mg by mouth daily.   Marland Kitchen atorvastatin (LIPITOR) 40 MG tablet TAKE 1 TABLET BY MOUTH ONCE DAILY  . calcium-vitamin D (OSCAL WITH D) 500-200 MG-UNIT per tablet Take 1 tablet by mouth every evening.   . carboxymethylcellulose (REFRESH PLUS) 0.5 % SOLN Place 2 drops into both eyes daily.  . Coenzyme Q10 200 MG capsule Take 200 mg by mouth every evening.   . fluticasone (FLONASE) 50 MCG/ACT nasal spray Place 2 sprays into both nostrils daily.   . furosemide (LASIX) 40 MG tablet TAKE 1 TABLET BY MOUTH TWICE DAILY  . losartan (  COZAAR) 50 MG tablet take 1 tablet by mouth once daily  .  metoprolol succinate (TOPROL-XL) 50 MG 24 hr tablet Take 75 mg by mouth daily. Patient has 25 mg tablets- takes three daily  . Misc Natural Products (OSTEO BI-FLEX TRIPLE STRENGTH PO) Take 1 tablet by mouth daily.  . Misc Natural Products (TART CHERRY ADVANCED PO) Take 1 capsule by mouth daily.  . Multiple Vitamins-Minerals (AIRBORNE PO) Take 1 tablet by mouth daily as needed (for vacation).   . Multiple Vitamins-Minerals (CENTRUM SILVER ULTRA WOMENS) TABS Take 1 tablet by mouth every evening.   . Omega-3 Fatty Acids (FISH OIL PO) Take 2 capsules by mouth every evening.   . rivaroxaban (XARELTO) 20 MG TABS tablet Take 1 tablet (20 mg total) by mouth daily with supper. (Patient taking differently: Take 20 mg by mouth at bedtime. )  . sodium chloride (OCEAN) 0.65 % nasal spray Place 2 sprays into the nose daily. After shower  . trolamine salicylate (ASPERCREME) 10 % cream Apply 1 application topically daily.  . TURMERIC PO Take 1 capsule by mouth daily.  Marland Kitchen albuterol (PROAIR HFA) 108 (90 Base) MCG/ACT inhaler INHALE 2 PUFFS INTO THE LUNGS EVERY 4 HOURS IF NEEDED FOR WHEEZING OR SHORTNESS OF BREATH(COUGH SHORTNESS OF BREATH AND WHEEZING) (Patient taking differently: Inhale 2 puffs into the lungs every 4 (four) hours as needed for wheezing or shortness of breath. )  . amiodarone (PACERONE) 200 MG tablet Take 1 tablet (200 mg total) by mouth daily. (Patient not taking: Reported on 08/29/2017)    FAMILY HISTORY:  Her family history includes Diabetes in her sister; Heart disease in her father and mother; Hypertension in her brother. There is no history of Colon cancer, Pancreatic cancer, Rectal cancer, or Stomach cancer.  SOCIAL HISTORY: She  reports that she has never smoked. She has never used smokeless tobacco. She reports that she drinks alcohol. She reports that she does not use drugs.   REVIEW OF SYSTEMS:   All negative; except for those that are bolded, which indicate positives.  Constitutional:  weight loss, weight gain, night sweats, fevers, chills, fatigue, weakness.  HEENT: headaches, sore throat, sneezing, nasal congestion, post nasal drip, difficulty swallowing, tooth/dental problems, visual complaints, visual changes, ear aches. Neuro: difficulty with speech, weakness, numbness, ataxia. CV:  chest pain, orthopnea, PND, swelling in lower extremities, dizziness, palpitations, syncope.  Resp: cough, hemoptysis, dyspnea, wheezing. GI: heartburn, indigestion, abdominal pain, nausea, vomiting, diarrhea, constipation, change in bowel habits, loss of appetite, hematemesis, melena, hematochezia.  GU: dysuria, change in color of urine, urgency or frequency, flank pain, hematuria. MSK: joint pain or swelling, decreased range of motion. Psych: change in mood or affect, depression, anxiety, suicidal ideations, homicidal ideations. Skin: rash, itching, bruising.  SUBJECTIVE:    VITAL SIGNS: BP 121/87   Pulse 95   Temp (!) 97.5 F (36.4 C) (Oral)   Resp (!) 28   Ht 5' 2"  (1.575 m)   Wt 82.9 kg Comment: Scale A  LMP  (LMP Unknown)   SpO2 97%   BMI 33.43 kg/m   HEMODYNAMICS:    VENTILATOR SETTINGS:    INTAKE / OUTPUT: I/O last 3 completed shifts: In: 41 [P.O.:368] Out: 1550 [Urine:1550]  PHYSICAL EXAMINATION: Gen:      No acute distress, pleasant, elderly HEENT:  EOMI, sclera anicteric Neck:     No masses; no thyromegaly Lungs:    Bilateral rhonchi CV:         Regular rate and rhythm; no murmurs  Abd:      + bowel sounds; soft, non-tender; no palpable masses, no distension Ext:    1-2+ edema; adequate peripheral perfusion Skin:      Warm and dry; no rash Neuro: alert and oriented x 3 Psych: normal mood and affect  LABS:  BMET Recent Labs  Lab 09/02/17 0336 09/03/17 0253 09/04/17 0422  NA 139 141 141  K 3.6 4.3 4.2  CL 110 111 111  CO2 19* 20* 21*  BUN 27* 25* 26*  CREATININE 1.28* 1.35* 1.21*  GLUCOSE 140* 149* 105*    Electrolytes Recent Labs  Lab  08/30/17 0034 08/30/17 0521  08/31/17 0018  09/02/17 0336 09/03/17 0253 09/04/17 0422  CALCIUM  --  7.6*   < > 8.0*   < > 8.1* 8.7* 8.7*  MG 2.0  --   --  2.0  --   --   --   --   PHOS  --  3.1  --  2.9  --   --   --   --    < > = values in this interval not displayed.    CBC Recent Labs  Lab 09/02/17 0336 09/03/17 0253 09/04/17 0423  WBC 6.3 6.4 6.6  HGB 11.6* 12.1 11.4*  HCT 37.4 39.5 36.8  PLT 152 194 227    Coag's Recent Labs  Lab 08/29/17 1145 08/31/17 1400 09/01/17 0054 09/01/17 2354  APTT  --  53* 101* 57*  INR 1.65  --   --   --     Sepsis Markers Recent Labs  Lab 08/29/17 1436  08/29/17 2210 08/30/17 0034 08/30/17 1109 08/31/17 0018 09/01/17 0054  LATICACIDVEN 0.66  --  1.2 1.7  --   --   --   PROCALCITON  --    < >  --   --  1.46 1.18 0.61   < > = values in this interval not displayed.    ABG No results for input(s): PHART, PCO2ART, PO2ART in the last 168 hours.  Liver Enzymes Recent Labs  Lab 08/29/17 2210 08/30/17 0521  AST 20  --   ALT 26  --   ALKPHOS 53  --   BILITOT 1.0  --   ALBUMIN 2.6* 2.6*    Cardiac Enzymes Recent Labs  Lab 08/30/17 1109 08/31/17 0018 08/31/17 1239  TROPONINI 0.04* 0.40* 0.11*    Glucose Recent Labs  Lab 08/29/17 2330  GLUCAP 124*    Imaging Dg Chest Port 1 View  Result Date: 09/04/2017 CLINICAL DATA:  Shortness of breath EXAM: PORTABLE CHEST 1 VIEW COMPARISON:  09/02/2017 FINDINGS: Cardiac enlargement without pulmonary vascular congestion. Bilateral pleural effusions with basilar atelectasis or infiltration, increasing since prior study. No pneumothorax. Calcification of aorta. IMPRESSION: Increasing bilateral pleural effusions and basilar atelectasis/ infiltration. Cardiac enlargement. Electronically Signed   By: Lucienne Capers M.D.   On: 09/04/2017 05:54     STUDIES:  CT chest 09/03/2017-bilateral effusion with atelectasis.  No obvious consolidation.  Cardiac enlargement with small  pericardial effusion.  I have reviewed the images personally.  CULTURES: 8/13 RVP >> neg 8/14 MRSA PCR >> neg 8/14 BCx 2>> 8/14 Urine Streptococcus, Legionella >>  ANTIBIOTICS:  ceftriaxone 8/13 >> 8/14 doxycycline >> 8/13 Azithromycin 8/19 >>  SIGNIFICANT EVENTS:   LINES/TUBES:   DISCUSSION: 82 year old with respiratory failure, pleural effusions in the setting of pneumonia, atrial fibrillation, CHF PCCM call back for intermittent periods of hypoxia, bilateral effusion.  I have reviewed her last CT scan with small-moderate  effusion.  They do not appear to be big enough to be do thoracentesis. No obvious consolidation to suggest parapneumonic effusion Suspect this is secondary to volume overload, CHF  Respiratory failure, bilateral effusion Follow chest x-ray Continue Lasix as allowed by renal function Continue ceftriaxone for now. Azithromycin added by primary team Check procalcitonin  Marshell Garfinkel MD [facility]

## 2017-09-04 NOTE — Progress Notes (Signed)
Notified practitioner of increased WOB, SOB.

## 2017-09-04 NOTE — Progress Notes (Signed)
Notified RT of SOB and expiratory wheeze.

## 2017-09-04 NOTE — Progress Notes (Addendum)
Progress Note  Patient Name: Brenda Matthews Date of Encounter: 09/04/2017  Primary Cardiologist: Dr. Lyman Bishop, MD  Subjective   Pt feels poorly today. C/o being very fatigued and sleepy. Denies CP or palpitations.   Inpatient Medications    Scheduled Meds: . amiodarone  200 mg Oral BID  . atorvastatin  40 mg Oral q1800  . azithromycin  500 mg Oral Daily  . dextromethorphan-guaiFENesin  1 tablet Oral BID  . fluticasone  2 spray Each Nare Daily  . furosemide  40 mg Intravenous Q8H  . ipratropium-albuterol  3 mL Nebulization BID  . mouth rinse  15 mL Mouth Rinse BID  . metoprolol tartrate  50 mg Oral BID  . rivaroxaban  15 mg Oral Q supper  . sodium chloride flush  3 mL Intravenous Q12H   Continuous Infusions: . sodium chloride    . cefTRIAXone (ROCEPHIN)  IV 1 g (09/04/17 0958)   PRN Meds: sodium chloride, acetaminophen, albuterol, ALPRAZolam, benzonatate, gi cocktail, guaiFENesin-dextromethorphan, ondansetron (ZOFRAN) IV, sodium chloride flush   Vital Signs    Vitals:   09/04/17 0441 09/04/17 0450 09/04/17 0737 09/04/17 0744  BP:   121/87   Pulse:      Resp: (!) 36  (!) 28   Temp:   (!) 97.5 F (36.4 C)   TempSrc:   Oral   SpO2: 95% 97% 97% 97%  Weight:      Height:        Intake/Output Summary (Last 24 hours) at 09/04/2017 1052 Last data filed at 09/04/2017 0957 Gross per 24 hour  Intake 3 ml  Output 1750 ml  Net -1747 ml   Filed Weights   09/02/17 1756 09/03/17 0835 09/04/17 0428  Weight: 84.6 kg 84.3 kg 82.9 kg    Physical Exam   General: Ill appearing, NAD Skin: Warm, dry, intact  Head: Normocephalic, atraumatic, clear, moist mucus membranes. Neck: Negative for carotid bruits. No JVD Lungs: Diminished bilaterally - especially in the left base.   + E to A changes in left base  Cardiovascular: Irregularly irregular with S1 S2. No murmurs, rubs, gallops, or LV heave appreciated. Abdomen: Soft, non-tender, non-distended with normoactive bowel  sounds. No obvious abdominal masses. MSK: Strength and tone appear normal for age. 5/5 in all extremities Extremities: No edema. No clubbing or cyanosis. DP/PT pulses 2+ bilaterally Neuro: Alert and oriented. No focal deficits. No facial asymmetry. MAE spontaneously. Psych: Responds to questions appropriately with normal affect.    Labs    Chemistry Recent Labs  Lab 08/29/17 2210 08/30/17 0521  09/02/17 0336 09/03/17 0253 09/04/17 0422  NA 136 139   < > 139 141 141  K 3.4* 3.8   < > 3.6 4.3 4.2  CL 106 106   < > 110 111 111  CO2 21* 22   < > 19* 20* 21*  GLUCOSE 155* 130*   < > 140* 149* 105*  BUN 28* 33*   < > 27* 25* 26*  CREATININE 1.91* 2.24*   < > 1.28* 1.35* 1.21*  CALCIUM 7.4* 7.6*   < > 8.1* 8.7* 8.7*  PROT 5.3*  --   --   --   --   --   ALBUMIN 2.6* 2.6*  --   --   --   --   AST 20  --   --   --   --   --   ALT 26  --   --   --   --   --  ALKPHOS 53  --   --   --   --   --   BILITOT 1.0  --   --   --   --   --   GFRNONAA 24* 20*   < > 38* 36* 41*  GFRAA 27* 23*   < > 45* 42* 48*  ANIONGAP 9 11   < > 10 10 9    < > = values in this interval not displayed.     Hematology Recent Labs  Lab 09/01/17 0054 09/02/17 0336 09/03/17 0253  WBC 7.6 6.3 6.4  RBC 4.21 4.00 4.17  HGB 12.3 11.6* 12.1  HCT 38.7 37.4 39.5  MCV 91.9 93.5 94.7  MCH 29.2 29.0 29.0  MCHC 31.8 31.0 30.6  RDW 15.0 14.8 15.0  PLT 154 152 194    Cardiac Enzymes Recent Labs  Lab 08/30/17 0521 08/30/17 1109 08/31/17 0018 08/31/17 1239  TROPONINI 0.04* 0.04* 0.40* 0.11*    Recent Labs  Lab 08/29/17 1151 08/29/17 1433  TROPIPOC 0.00 0.01     BNP Recent Labs  Lab 08/29/17 2210 09/02/17 0336  BNP 1,028.9* 1,314.7*    DDimer No results for input(s): DDIMER in the last 168 hours.   Radiology    Ct Chest Wo Contrast  Result Date: 09/03/2017 CLINICAL DATA:  Post therapeutic collapse of lung status. EXAM: CT CHEST WITHOUT CONTRAST TECHNIQUE: Multidetector CT imaging of the chest  was performed following the standard protocol without IV contrast. COMPARISON:  Chest radiograph 09/02/2017 FINDINGS: Cardiovascular: Cardiac enlargement with small pericardial effusion. Coronary artery calcifications. Normal caliber thoracic aorta with aortic calcification. Mediastinum/Nodes: Esophagus is decompressed. Mediastinal lymph nodes are prominent without pathologic enlargement, measuring up to about 9 mm short axis dimension. Probably reactive. Lungs/Pleura: Evaluation is limited due to motion artifact. Bilateral pleural effusions with basilar atelectasis. No consolidation otherwise demonstrated in the lungs. No pneumothorax. Airways are patent. Upper Abdomen: Large low-attenuation lesion in the right lobe of the liver as not well characterized on noncontrast imaging but probably represents a cyst. Consider ultrasound for correlation if clinically indicated. Musculoskeletal: Degenerative changes in the spine. No destructive bone lesions. IMPRESSION: Cardiac enlargement with small pericardial effusion. Bilateral pleural effusions with basilar atelectasis. Large low-attenuation lesion in the right lobe of the liver probably a cyst. This could be confirmed at ultrasound. Aortic atherosclerosis. Electronically Signed   By: Lucienne Capers M.D.   On: 09/03/2017 04:28   Dg Chest Port 1 View  Result Date: 09/04/2017 CLINICAL DATA:  Shortness of breath EXAM: PORTABLE CHEST 1 VIEW COMPARISON:  09/02/2017 FINDINGS: Cardiac enlargement without pulmonary vascular congestion. Bilateral pleural effusions with basilar atelectasis or infiltration, increasing since prior study. No pneumothorax. Calcification of aorta. IMPRESSION: Increasing bilateral pleural effusions and basilar atelectasis/ infiltration. Cardiac enlargement. Electronically Signed   By: Lucienne Capers M.D.   On: 09/04/2017 05:54   Telemetry    Atrial fibrillation HR 120- Personally Reviewed  ECG    08/29/17 AF with HR 104- Personally  Reviewed  Cardiac Studies   Echocardiogram 08/10/2017:  Study Conclusions  - Left ventricle: The cavity size was normal. Wall thickness was increased in a pattern of mild LVH. Systolic function was normal. The estimated ejection fraction was in the range of 50% to 55%. Wall motion was normal; there were no regional wall motion abnormalities. - Aortic valve: There was trivial regurgitation. - Aortic root: The aortic root was mildly dilated. - Mitral valve: There was mild regurgitation. - Left atrium: The atrium was severely dilated. -  Right atrium: The atrium was mildly dilated. - Tricuspid valve: There was mild-moderate regurgitation. - Pulmonary arteries: PA peak pressure: 32 mm Hg (S). - Pericardium, extracardiac: A trivial pericardial effusion was identified.  Impressions:  - Normal LV systolic function; mild LVH; trace AI; mildly dilated aortic root; mild MR; biatrial enlargement; mild to moderate TR.  Coronary angiography 09/01/2017:  Conclusions: 1. No angiographically significant coronary artery disease. 2. Upper normal left ventricular filling pressure.   Patient Profile     82 y.o. female with medical history significantfor paroxysmal A. Fib on anticoagulation, hypertension, hyperlipidemia, chronic renal insufficiency stage II, autoimmune hepatitis presents to the emergency department with the chief complaint of chest pain shortness of breath generalized weakness.  Assessment & Plan    1.  Paroxysmal atrial fibrillation with RVR: -HR overnight in the 100's>>currenly in the 120's -Continue with amiodarone 200, Xarelto and metoprolol 50 mg twice daily for now -Per note review, there were inital plans for outpatient DCCV per Dr. Debara Pickett -Consider inpatient cardioversion to determine if her symptoms are related to AF, PNA or a culmination of both. She has been compliant with Xarelto since dx last year  -If no DCCV at this time, consider increasing her  metoprolol -CHA2DS2VASc =4 (age, female, HTN)  2.  Hypotension secondary to sepsis/PNA: -Stabilized, patient received IV fluid hydration for hypotension and was started on Neo-Synephrine x1 night -Blood cultures drawn -CXR with pneumonia, IV antibiotics started  3.  Acute hypoxic respiratory failure: -Chest CT with bilateral pleural effusions with bibasilar atelectasis. -Started on IV Lasix 40 mg daily per internal medicine -BNP on admission 1314 -Weight, 182lb today, 167lb on admission -I&O, +2 L secondary to IV good hydration -Continue IV Lasix 58m Q8H given improvement in renal function and net positive fluid status  -Creatinine, 1.21 today improved from 1.35 yesterday -Continue DuoNeb, Mucinex  4.  Hypertension: -Stable, 121/87, 125/80, 123/89 -Continue metoprolol 50 mg twice daily for BP and rate control, could increase if no DCCV planned    Signed, JKathyrn DrownNP-C [facility] Pager: [phone]/19/2019, 10:52 AM     For questions or updates, please contact   Please consult [email or web address] for contact info under Cardiology/STEMI.  Attending Note:   The patient was seen and examined.  Agree with assessment and plan as noted above.  Changes made to the above note as needed.  Patient seen and independently examined with JKathyrn Drown NP .   We discussed all aspects of the encounter. I agree with the assessment and plan as stated above.  1.   Atrial fib:   HR is currently well controlled.  I do not think that her symptoms of shortness of breath are related to the atrial fibrillation.  She has significant consolidation of her left lower lobe with E to A changes.  I suspect this is the cause of her shortness of breath. Continue Xarelto. I think she would benefit from being more stable from an pneumonia / pleural effusion standpoint before we attempt cardioversion   2.  Pneumonia: The patient has significant E to A changes on exam. Further plans per Int. Med.     I  have spent a total of 40 minutes with patient reviewing hospital  notes , telemetry, EKGs, labs and examining patient as well as establishing an assessment and plan that was discussed with the patient. > 50% of time was spent in direct patient care.    PThayer Headings JBrooke Bonito, MD, FOakbend Medical Center8/19/2019, 2:13 PM [address]  [place] Pager [phone]

## 2017-09-05 ENCOUNTER — Ambulatory Visit: Payer: Medicare Other | Admitting: Internal Medicine

## 2017-09-05 DIAGNOSIS — I481 Persistent atrial fibrillation: Secondary | ICD-10-CM

## 2017-09-05 LAB — BASIC METABOLIC PANEL
ANION GAP: 11 (ref 5–15)
BUN: 26 mg/dL — AB (ref 8–23)
CHLORIDE: 107 mmol/L (ref 98–111)
CO2: 23 mmol/L (ref 22–32)
Calcium: 8.8 mg/dL — ABNORMAL LOW (ref 8.9–10.3)
Creatinine, Ser: 1.25 mg/dL — ABNORMAL HIGH (ref 0.44–1.00)
GFR calc Af Amer: 46 mL/min — ABNORMAL LOW (ref >60)
GFR calc non Af Amer: 40 mL/min — ABNORMAL LOW (ref >60)
GLUCOSE: 128 mg/dL — AB (ref 70–99)
POTASSIUM: 4.2 mmol/L (ref 3.5–5.1)
Sodium: 141 mmol/L (ref 135–145)

## 2017-09-05 LAB — PROCALCITONIN
Procalcitonin: 0.13 ng/mL
Procalcitonin: <0.1 ng/mL

## 2017-09-05 LAB — CBC
HCT: 38.3 % (ref 36.0–46.0)
HEMOGLOBIN: 11.9 g/dL — AB (ref 12.0–15.0)
MCH: 29.1 pg (ref 26.0–34.0)
MCHC: 31.1 g/dL (ref 30.0–36.0)
MCV: 93.6 fL (ref 78.0–100.0)
Platelets: 244 10*3/uL (ref 150–400)
RBC: 4.09 MIL/uL (ref 3.87–5.11)
RDW: 15 % (ref 11.5–15.5)
WBC: 7.3 10*3/uL (ref 4.0–10.5)

## 2017-09-05 MED ORDER — RIVAROXABAN 15 MG PO TABS
15.0000 mg | ORAL_TABLET | Freq: Every day | ORAL | Status: DC
  Administered 2017-09-05 – 2017-09-06 (×2): 15 mg via ORAL
  Filled 2017-09-05 (×2): qty 1

## 2017-09-05 MED ORDER — RIVAROXABAN 20 MG PO TABS: 20.0000 mg | ORAL_TABLET | Freq: Every day | ORAL | Status: DC

## 2017-09-05 MED ORDER — GUAIFENESIN ER 600 MG PO TB12
1200.0000 mg | ORAL_TABLET | Freq: Two times a day (BID) | ORAL | Status: DC
  Administered 2017-09-05 – 2017-09-11 (×12): 1200 mg via ORAL
  Filled 2017-09-05 (×13): qty 2

## 2017-09-05 NOTE — Progress Notes (Signed)
[place] for Xarelto  Indication: atrial fibrillation  Allergies  Allergen Reactions  . Augmentin [Amoxicillin-Pot Clavulanate] Nausea And Vomiting and Other (See Comments)    "projectile vomiting" Has patient had a PCN reaction causing immediate rash, facial/tongue/throat swelling, SOB or lightheadedness with hypotension:No Has patient had a PCN reaction causing severe rash involving mucus membranes or skin necrosis:No Has patient had a PCN reaction that required hospitalization:No Has patient had a PCN reaction occurring within the last 10 years:Yes If all of the above answers are "NO", then may proceed with Cephalosporin use.     Patient Measurements: Height: 5' 2"  (157.5 cm) Weight: 190 lb 11.2 oz (86.5 kg) IBW/kg (Calculated) : 50.1 Heparin Dosing Weight: 67.1 kg  Vital Signs: Temp: 98.2 F (36.8 C) (08/20 0420) Temp Source: Oral (08/20 0420) BP: 130/77 (08/20 0934) Pulse Rate: 113 (08/20 0934)  Labs: Recent Labs    09/03/17 0253 09/04/17 0422 09/04/17 0423 09/05/17 0338  HGB 12.1  --  11.4* 11.9*  HCT 39.5  --  36.8 38.3  PLT 194  --  227 244  CREATININE 1.35* 1.21*  --  1.25*    Estimated Creatinine Clearance: 36.7 mL/min (A) (by C-G formula based on SCr of 1.25 mg/dL (H)).  Assessment: 82 year old female with Afib s/p LHC and resumed on Xarelto. Patient takes Xarelto 20 mg daily at home but was dose reduced given worsened renal fx and age. CrCl ~ 45-50 mL/min. Hgb stable, Plt wnl.   Plan:  Continue Xarelto 15 mg daily for now Will monitor renal fx   Albertina Parr, PharmD., BCPS Clinical Pharmacist Clinical phone for 09/05/17 until 3:30pm: [phone] If after 3:30pm, please refer to [facility] for unit-specific pharmacist

## 2017-09-05 NOTE — Progress Notes (Signed)
PROGRESS NOTE    [name]  BHA:[id number] DOB: 1935-07-14 DOA: 08/29/2017 PCP: [name], MD    Brief Narrative: Brenda Matthews is a very pleasant 82 y.o. female with medical history significant for paroxysmal A. Fib on anticoagulation, hypertension, hyperlipidemia, chronic renal insufficiency stage II, autoimmune hepatitis presents to the emergency department with the chief complaint of chest pain shortness of breath generalized weakness. Initial evaluation reveals max temp of 100.6, EKG with atrial fibrillation and heart rate of 106, acute kidney injury. [facility] are asked to admit  Information is obtained from the patient and the chart. She states she was in her usual state of health until this morning she awakened with chest discomfort. She describes this discomfort as a "pressure" and is located across the chest just below clavicles on left and right. She says it's worse when she talked and when she touches that area of her chest. Associated symptoms include generalized weakness shortness of breath moist sounding but nonproductive cough. She states over the last couple of weeks her heart rhythm has been going "in and out of A. Fib". She has had her metoprolol adjusted and her Lasix adjusted. She states she had an appointment for cardioversion but her rhythm converted on its own so that was canceled. He denies any feeling of palpitations headache dizziness syncope or near-syncope. She denies nausea vomiting diarrhea constipation melena bright red blood per rectum. She denies dysuria hematuria frequency or urgency. She denies lower extremity edema or arthropathy at.  Patient was transfer to ICU overnight due to hypotension. She received IV bolus, started on neo/. Now off pressors. Diagnosed with PNA. Started on ceftriaxone. Renal function improving. HR elevated, resume metoprolol.  Transfer to step down unit.   Patient continue to have SOB, hypoxemia, suspect is related to PNA,  pleural effusion. Lasix change to 40 mg IV every 8 hours. Pulmonology consulted, patient was not improving. Plan is for follow up chest x ray.     Assessment & Plan:   Principal Problem:   Sepsis ([place]) Active Problems:   Hyperlipidemia   Essential hypertension   Chronic renal insufficiency, stage II (mild)   Bronchopneumonia   PAF (paroxysmal atrial fibrillation) (HCC)   Chronic anticoagulation   Chest pain   DOE (dyspnea on exertion)   Arterial hypotension   Acute-on-chronic kidney injury (HCC)   Rash in adult   Atrial fibrillation and flutter (HCC)   Non-ST elevation (NSTEMI) myocardial infarction ([place])   1-Hypotension; Diferrential cardiogenic vs sepsis.  Received IV bolus. Was on neo for one overnight.  Blood cultures. No growth.  Pro-calcitonin and lactic acid normal.  CCM was consulted and sign off.   Chest x ray with PNA, started  IV antibiotics. Ceftriaxone  Resolved.    2-Acute Hypoxic Respiratory failure Multifactorial related to PNA, pleural effusion, pulmonary edema post fluid resuscitation.  Dyspnea; EKG changes, mild elevation troponin.  -Cardiology consulted. Underwent cath; no obstructive  -8-17;Worsening hypoxemia and dyspnea. Repeated chest x ray stable. Persistent atelectasis infiltrates. CT chest with small pleural effusion, left LE consolidation. Reviewed with Dr Chase Caller. He recommend IV lasix, incentive spirometry and complete course of antibiotics,  Needs repeat CT chest in 3-to 6 months. And x ray in one month.  -8-18 improved dyspnea.  -8-19; increase work of breathing, worsening dyspnea. Repeated chest x ray worsening bilateral pleural effusion and atelectasis.  - lasix to 40 mg IV every 8 hour on 8-19/  pulmonary consulted again. recommended IV lasix, follow chest x  ray.   3-Pneumonia;  Continue with  IV ceftriaxone. Day 6. Added  Azithromycin 8-19. No improvement.  Strept Pneumonia negative.    -Rash; Dr Chase Caller consulted surgery.  Vasculitis panel RF 22, vasculitis panel negative Rash improving, hold on biopsy.  Suspect related to thrombocytopenia.   4-Paroxysmal A fib;  Continue with amiodarone and xarelto.  Resume metoprolol, now that BP has improved.  Cardiology re-consulted  5-HTN; hold losartan.   6-Hyperlipidemia; continue with statins.   7-Acute on chronic renal failure stage II; Avoid hypotension.  Renal US negative for hydronephrosis.  Stable.   8-History of NASH;     DVT prophylaxis:on xarelto.  Code Status: Full code.  Family Communication: care discussed with patient.  Disposition Plan: remain in the ICU   Consultants:   Cardiology  CCM   Procedures:   Antimicrobials: Ceftriaxone 8-14  Subjective: She was alert, getting to eat breakfast. Feels tired, but she is breathing better today   Objective: Vitals:   09/04/17 2350 09/05/17 0155 09/05/17 0420 09/05/17 0500  BP: 139/88  121/88   Pulse:   (!) 116   Resp: (!) 39  (!) 27   Temp: (!) 97.5 F (36.4 C)  98.2 F (36.8 C)   TempSrc: Oral  Oral   SpO2: 98% 98% 97%   Weight:    86.5 kg  Height:        Intake/Output Summary (Last 24 hours) at 09/05/2017 0838 Last data filed at 09/05/2017 0700 Gross per 24 hour  Intake 3 ml  Output 2300 ml  Net -2297 ml   Filed Weights   09/03/17 0835 09/04/17 0428 09/05/17 0500  Weight: 84.3 kg 82.9 kg 86.5 kg    Examination:  General exam:NAD Respiratory system: Bilateral crackles.  Cardiovascular system: S 1, S 2 RRR Gastrointestinal system: BS present, soft, nt Central nervous system; Non focal.  Extremities: Symmetric power.  Skin; rash improved  Data Reviewed: I have personally reviewed following labs and imaging studies  CBC: Recent Labs  Lab 08/29/17 2210  08/31/17 0018 09/01/17 0054 09/02/17 0336 09/03/17 0253 09/04/17 0423 09/05/17 0338  WBC 10.4   < > 9.5 7.6 6.3 6.4 6.6 7.3  NEUTROABS 8.6*  --  7.4 5.8 4.7 4.9  --   --   HGB 12.3   < > 12.8 12.3 11.6*  12.1 11.4* 11.9*  HCT 39.2   < > 41.0 38.7 37.4 39.5 36.8 38.3  MCV 91.8   < > 91.9 91.9 93.5 94.7 93.9 93.6  PLT 121*   < > 113* 154 152 194 227 244   < > = values in this interval not displayed.   Basic Metabolic Panel: Recent Labs  Lab 08/30/17 0034 08/30/17 4782  08/31/17 0018 09/01/17 0054 09/02/17 9562 09/03/17 0253 09/04/17 0422 09/05/17 0338  NA  --  139   < > 137 133* 139 141 141 141  K  --  3.8   < > 3.5 3.7 3.6 4.3 4.2 4.2  CL  --  106   < > 103 105 110 111 111 107  CO2  --  22   < > 19* 20* 19* 20* 21* 23  GLUCOSE  --  130*   < > 119* 168* 140* 149* 105* 128*  BUN  --  33*   < > 33* 29* 27* 25* 26* 26*  CREATININE  --  2.24*   < > 1.92* 1.47* 1.28* 1.35* 1.21* 1.25*  CALCIUM  --  7.6*   < >  8.0* 7.9* 8.1* 8.7* 8.7* 8.8*  MG 2.0  --   --  2.0  --   --   --   --   --   PHOS  --  3.1  --  2.9  --   --   --   --   --    < > = values in this interval not displayed.   GFR: Estimated Creatinine Clearance: 36.7 mL/min (A) (by C-G formula based on SCr of 1.25 mg/dL (H)). Liver Function Tests: Recent Labs  Lab 08/29/17 2210 08/30/17 0521  AST 20  --   ALT 26  --   ALKPHOS 53  --   BILITOT 1.0  --   PROT 5.3*  --   ALBUMIN 2.6* 2.6*   No results for input(s): LIPASE, AMYLASE in the last 168 hours. No results for input(s): AMMONIA in the last 168 hours. Coagulation Profile: Recent Labs  Lab 08/29/17 1145  INR 1.65   Cardiac Enzymes: Recent Labs  Lab 08/29/17 2217 08/30/17 0521 08/30/17 1109 08/31/17 0018 08/31/17 1239  TROPONINI <0.03 0.04* 0.04* 0.40* 0.11*   BNP (last 3 results) Recent Labs    08/01/17 1017 08/04/17 1447  PROBNP 631.0* 574.0*   HbA1C: No results for input(s): HGBA1C in the last 72 hours. CBG: Recent Labs  Lab 08/29/17 2330  GLUCAP 124*   Lipid Profile: No results for input(s): CHOL, HDL, LDLCALC, TRIG, CHOLHDL, LDLDIRECT in the last 72 hours. Thyroid Function Tests: No results for input(s): TSH, T4TOTAL, FREET4, T3FREE,  THYROIDAB in the last 72 hours. Anemia Panel: No results for input(s): VITAMINB12, FOLATE, FERRITIN, TIBC, IRON, RETICCTPCT in the last 72 hours. Sepsis Labs: Recent Labs  Lab 08/29/17 1436  08/29/17 2210 08/30/17 0034 08/30/17 1109 08/31/17 0018 09/01/17 0054 09/04/17 1859  PROCALCITON  --    < >  --   --  1.46 1.18 0.61 0.13  LATICACIDVEN 0.66  --  1.2 1.7  --   --   --   --    < > = values in this interval not displayed.    Recent Results (from the past 240 hour(s))  Respiratory Panel by PCR     Status: None   Collection Time: 08/29/17  4:30 PM  Result Value Ref Range Status   Adenovirus NOT DETECTED NOT DETECTED Final   Coronavirus 229E NOT DETECTED NOT DETECTED Final   Coronavirus HKU1 NOT DETECTED NOT DETECTED Final   Coronavirus NL63 NOT DETECTED NOT DETECTED Final   Coronavirus OC43 NOT DETECTED NOT DETECTED Final   Metapneumovirus NOT DETECTED NOT DETECTED Final   Rhinovirus / Enterovirus NOT DETECTED NOT DETECTED Final   Influenza A NOT DETECTED NOT DETECTED Final   Influenza B NOT DETECTED NOT DETECTED Final   Parainfluenza Virus 1 NOT DETECTED NOT DETECTED Final   Parainfluenza Virus 2 NOT DETECTED NOT DETECTED Final   Parainfluenza Virus 3 NOT DETECTED NOT DETECTED Final   Parainfluenza Virus 4 NOT DETECTED NOT DETECTED Final   Respiratory Syncytial Virus NOT DETECTED NOT DETECTED Final   Bordetella pertussis NOT DETECTED NOT DETECTED Final   Chlamydophila pneumoniae NOT DETECTED NOT DETECTED Final   Mycoplasma pneumoniae NOT DETECTED NOT DETECTED Final    Comment: Performed at [facility], [address] [place], [place] [address]  MRSA PCR Screening     Status: None   Collection Time: 08/29/17 11:29 PM  Result Value Ref Range Status   MRSA by PCR NEGATIVE NEGATIVE Final  Comment:        The GeneXpert MRSA Assay (FDA approved for NASAL specimens only), is one component of a comprehensive MRSA colonization surveillance program. It is  not intended to diagnose MRSA infection nor to guide or monitor treatment for MRSA infections. Performed at [facility], [place] [address], [place], [place] [address]   Culture, blood (routine x 2)     Status: None   Collection Time: 08/30/17 12:25 AM  Result Value Ref Range Status   Specimen Description BLOOD LEFT HAND  Final   Special Requests   Final    BOTTLES DRAWN AEROBIC AND ANAEROBIC Blood Culture adequate volume   Culture   Final    NO GROWTH 5 DAYS Performed at [facility], [place] [address] [place], [place] [address]    Report Status 09/04/2017 FINAL  Final  Culture, blood (routine x 2)     Status: None   Collection Time: 08/30/17 12:30 AM  Result Value Ref Range Status   Specimen Description BLOOD RIGHT ANTECUBITAL  Final   Special Requests   Final    BOTTLES DRAWN AEROBIC ONLY Blood Culture adequate volume   Culture   Final    NO GROWTH 5 DAYS Performed at [facility], [place] [address], [place], [place] [address]    Report Status 09/04/2017 FINAL  Final         Radiology Studies: Dg Chest Port 1 View  Result Date: 09/04/2017 CLINICAL DATA:  Shortness of breath EXAM: PORTABLE CHEST 1 VIEW COMPARISON:  09/02/2017 FINDINGS: Cardiac enlargement without pulmonary vascular congestion. Bilateral pleural effusions with basilar atelectasis or infiltration, increasing since prior study. No pneumothorax. Calcification of aorta. IMPRESSION: Increasing bilateral pleural effusions and basilar atelectasis/ infiltration. Cardiac enlargement. Electronically Signed   By: Lucienne Capers M.D.   On: 09/04/2017 05:54        Scheduled Meds: . amiodarone  200 mg Oral BID  . atorvastatin  40 mg Oral q1800  . azithromycin  500 mg Oral Daily  . dextromethorphan-guaiFENesin  1 tablet Oral BID  . fluticasone  2 spray Each Nare Daily  . furosemide  40 mg Intravenous Q8H  . ipratropium-albuterol  3 mL Nebulization BID  . mouth rinse  15 mL Mouth Rinse BID  .  metoprolol tartrate  50 mg Oral BID  . rivaroxaban  15 mg Oral Q supper  . sodium chloride flush  3 mL Intravenous Q12H   Continuous Infusions: . sodium chloride    . cefTRIAXone (ROCEPHIN)  IV 1 g (09/04/17 0958)     LOS: 6 days    Time spent: 35 minutes.     Elmarie Shiley, MD [facility] Pager [phone]  If 7PM-7AM, please contact night-coverage [email or web address] Password [facility] 09/05/2017, 8:38 AM

## 2017-09-05 NOTE — Progress Notes (Addendum)
Progress Note  Patient Name: Brenda Matthews Date of Encounter: 09/05/2017  Primary Cardiologist: [name], MD   Subjective   Patient reports no active CP. She remains SOB with current PNA. She c/o very little sleep and reduced appetite but otherwise no cardiac symptoms reported at this time.     Inpatient Medications    Scheduled Meds: . amiodarone  200 mg Oral BID  . atorvastatin  40 mg Oral q1800  . azithromycin  500 mg Oral Daily  . dextromethorphan-guaiFENesin  1 tablet Oral BID  . fluticasone  2 spray Each Nare Daily  . furosemide  40 mg Intravenous Q8H  . ipratropium-albuterol  3 mL Nebulization BID  . mouth rinse  15 mL Mouth Rinse BID  . metoprolol tartrate  50 mg Oral BID  . rivaroxaban  15 mg Oral Q supper  . sodium chloride flush  3 mL Intravenous Q12H   Continuous Infusions: . sodium chloride    . cefTRIAXone (ROCEPHIN)  IV 1 g (09/04/17 0958)   PRN Meds: sodium chloride, acetaminophen, albuterol, ALPRAZolam, benzonatate, gi cocktail, guaiFENesin-dextromethorphan, ondansetron (ZOFRAN) IV, sodium chloride flush   Vital Signs    Vitals:   09/04/17 2350 09/05/17 0155 09/05/17 0420 09/05/17 0500  BP: 139/88  121/88   Pulse:   (!) 116   Resp: (!) 39  (!) 27   Temp: (!) 97.5 F (36.4 C)  98.2 F (36.8 C)   TempSrc: Oral  Oral   SpO2: 98% 98% 97%   Weight:    86.5 kg  Height:        Intake/Output Summary (Last 24 hours) at 09/05/2017 0854 Last data filed at 09/05/2017 0700 Gross per 24 hour  Intake 3 ml  Output 2300 ml  Net -2297 ml   Filed Weights   09/03/17 0835 09/04/17 0428 09/05/17 0500  Weight: 84.3 kg 82.9 kg 86.5 kg    Telemetry    Afib, HR 106-116 - Personally Reviewed  ECG    N/A - Personally Reviewed  Physical Exam   Physical Exam  Constitutional: She is oriented to person, place, and time. She appears distressed.  Frail elderly woman  HENT:  Head: Normocephalic and atraumatic.  Neck: Normal range of motion. Neck  supple.  Cardiovascular: Intact distal pulses. Exam reveals no gallop and no friction rub.  No murmur heard. IRIR  Pulmonary/Chest: She has wheezes.  diminished breath sounds with E to A egophony in the left base.   Abdominal: Bowel sounds are normal. She exhibits distension. She exhibits no mass. There is no tenderness. There is no rebound and no guarding.  Firm abdomen, not TTP  Musculoskeletal: Normal range of motion. She exhibits edema. She exhibits no tenderness or deformity.  2+ edema with compression stockings  Neurological: She is alert and oriented to person, place, and time.  At times confused, which she attributes to medications. Otherwise AAOx3  Skin: Skin is warm and dry. Rash noted. She is not diaphoretic. No pallor.  Rash per IM  Psychiatric: She has a normal mood and affect. Her behavior is normal. Judgment and thought content normal.     Labs    Chemistry Recent Labs  Lab 08/29/17 2210 08/30/17 0521  09/03/17 0253 09/04/17 0422 09/05/17 0338  NA 136 139   < > 141 141 141  K 3.4* 3.8   < > 4.3 4.2 4.2  CL 106 106   < > 111 111 107  CO2 21* 22   < >  20* 21* 23  GLUCOSE 155* 130*   < > 149* 105* 128*  BUN 28* 33*   < > 25* 26* 26*  CREATININE 1.91* 2.24*   < > 1.35* 1.21* 1.25*  CALCIUM 7.4* 7.6*   < > 8.7* 8.7* 8.8*  PROT 5.3*  --   --   --   --   --   ALBUMIN 2.6* 2.6*  --   --   --   --   AST 20  --   --   --   --   --   ALT 26  --   --   --   --   --   ALKPHOS 53  --   --   --   --   --   BILITOT 1.0  --   --   --   --   --   GFRNONAA 24* 20*   < > 36* 41* 40*  GFRAA 27* 23*   < > 42* 48* 46*  ANIONGAP 9 11   < > 10 9 11    < > = values in this interval not displayed.     Hematology Recent Labs  Lab 09/03/17 0253 09/04/17 0423 09/05/17 0338  WBC 6.4 6.6 7.3  RBC 4.17 3.92 4.09  HGB 12.1 11.4* 11.9*  HCT 39.5 36.8 38.3  MCV 94.7 93.9 93.6  MCH 29.0 29.1 29.1  MCHC 30.6 31.0 31.1  RDW 15.0 15.2 15.0  PLT 194 227 244    Cardiac  Enzymes Recent Labs  Lab 08/30/17 0521 08/30/17 1109 08/31/17 0018 08/31/17 1239  TROPONINI 0.04* 0.04* 0.40* 0.11*    Recent Labs  Lab 08/29/17 1151 08/29/17 1433  TROPIPOC 0.00 0.01     BNP Recent Labs  Lab 08/29/17 2210 09/02/17 0336  BNP 1,028.9* 1,314.7*     DDimer No results for input(s): DDIMER in the last 168 hours.   Radiology    Dg Chest Port 1 View  Result Date: 09/04/2017 CLINICAL DATA:  Shortness of breath EXAM: PORTABLE CHEST 1 VIEW COMPARISON:  09/02/2017 FINDINGS: Cardiac enlargement without pulmonary vascular congestion. Bilateral pleural effusions with basilar atelectasis or infiltration, increasing since prior study. No pneumothorax. Calcification of aorta. IMPRESSION: Increasing bilateral pleural effusions and basilar atelectasis/ infiltration. Cardiac enlargement. Electronically Signed   By: Lucienne Capers M.D.   On: 09/04/2017 05:54    Cardiac Studies   Echocardiogram 08/10/2017: Study Conclusions - Left ventricle: The cavity size was normal. Wall thickness was increased in a pattern of mild LVH. Systolic function was normal. The estimated ejection fraction was in the range of 50% to 55%. Wall motion was normal; there were no regional wall motion abnormalities. - Aortic valve: There was trivial regurgitation. - Aortic root: The aortic root was mildly dilated. - Mitral valve: There was mild regurgitation. - Left atrium: The atrium was severely dilated. - Right atrium: The atrium was mildly dilated. - Tricuspid valve: There was mild-moderate regurgitation. - Pulmonary arteries: PA peak pressure: 32 mm Hg (S). - Pericardium, extracardiac: A trivial pericardial effusion was identified. Impressions: - Normal LV systolic function; mild LVH; trace AI; mildly dilated aortic root; mild MR; biatrial enlargement; mild to moderate TR.  09/01/2017 LHC and coronary angiography Conclusions: 1. No angiographically significant coronary  artery disease. 2. Upper normal left ventricular filling pressure  Recommendations: 1. Continue medical therapy of diastolic heart failure and atrial fibrillation. 2. Gentle post-catheterization hydration given resolved acute kidney injury. 3. Restart heparin infusion 2 hours after  TR band removal; rivaroxaban could be restarted as soon as tomorrow. Recommend to resume Rivaroxaban, at currently prescribed dose and frequency, on 09/01/17.  Concurrent antiplatelet therapy not recommended.    Patient Profile     82 y.o. female with medical history significantfor paroxysmal A. Fib on anticoagulation (Xarelto), hypertension, hyperlipidemia, chronic renal insufficiency stage II, autoimmune hepatitis presents to the emergency department with the chief complaint of chest pain shortness of breath generalized weakness.  Assessment & Plan    1. Chest pain in setting of PNA & paroxysmal Afib with RVR on anticoagulation - No current CP reported. - HR 84-116; Telemetry HR 106-116 bpm. - Continue Amiodarone 200 mg bid po, Xarelto 5m po qd and metoprolol 538mbid. - Cardiac imaging as above: 07/2017 echo EF 50-55% with trivial pericardial effusion. LHC with recommendation for medical.  - Current plan to hold off on DCCV until stable from PNA as below.  - Echo (07/2017) as above suggests biatrial enlargement - will DCCV hold? Patient reports she has not yet attempted DCCV in the past. - Consider increasing metoprolol as BP allows given HR in 100s.  - CHA2DS2VASc at least 4 (agex2, female, HTN). Continue anticoagulation.   2. Hypotension 2/2 sepsis / PNA - Stable following ICU overnight 2/2 hypotension.  - Received IVF, pressors. IV abx started. Blood cx drawn - no growth. - CXR as above shows PNA, likely etiology of SOB v. Afib. - Per IM.  3. Acute hypoxic respiratory failure  - Chest CT as above - b/l pleural effusions with bibasilar atelectasis. - Continue IV Lasix 4064m8h.  - Continue to monitor  renal function in setting of ongoing diuresis. Cr 1.25 (8/20). - BNP 1314 at admission. - Weight increasing 167 lb  182 lb  190.69lb today (8/20). - Strict I/O. Daily weights. - Continue nebs, Mucinex PRN.  4. Essential HTN  - BP Stable 121/87  125/80  123/89  121/88. - Continue Metoprolol 81m42md.   - HR as above. Consider increase of  blocker if needed and HR allows. - Holding Losartan in setting of illness / renal function. - Cr 1.35  1.21  1.25. Continue to monitor renal function as improves & as above.  5. HLD - Continue atorvastatin 40mg45mq1800. - Monitor lipid and liver function as outpatient.  6. Acute on chronic CKD, III - Renal function - Cr improving. 8/20 Cr 1.25. - K+ 4.2.  - Daily BMET.  - Continue to monitor renal function and electrolytes.  7. Anemia, in setting of chronic disease - Hgb 11.4  11.9; RBC 3.92  4.09. - Daily CBC. - Continue to monitor.  8. Rash - Per IM  For questions or updates, please contact [facility] consult [email or web address] for contact info under Cardiology/STEMI.      SigneDorthula NettlesC  Pager [phone]/2019, 8:54 AM    Attending Note:   The patient was seen and examined.  Agree with assessment and plan as noted above.  Changes made to the above note as needed.  Patient seen and independently examined with JacquMarrianne Mood.   We discussed all aspects of the encounter. I agree with the assessment and plan as stated above.  1.   Atrial for ablation with rapid ventricular response.  Her atrial fibrillation was fairly well controlled yesterday.  The rate is a little fast today.  We could consider increasing her metoprolol but I really think that the driving force with her atrial fibrillation with  RVR  is the pneumonia and her pulmonary issues. Agree with the pulmonary consultant.  She may need thoracentesis.  Continue Lasix to see if that helps.   I have spent a total of 40 minutes with patient  reviewing hospital  notes , telemetry, EKGs, labs and examining patient as well as establishing an assessment and plan that was discussed with the patient. > 50% of time was spent in direct patient care.    Thayer Headings, Brooke Bonito., MD, [facility] 09/05/2017, 1:09 PM [address]  [place] Pager [phone]

## 2017-09-05 NOTE — Progress Notes (Signed)
PULMONARY / CRITICAL CARE MEDICINE   Name: OUIDA Matthews MRN: [id number] DOB: 08/14/1935    ADMISSION DATE:  08/29/2017   CONSULTATION DATE: 09/04/17  REFERRING MD:   Rudolpho Sevin MD  CHIEF COMPLAINT: Bilateral pleural effusion  HISTORY OF PRESENT ILLNESS:   82 year old with history of atrial fibrillation, CHF, hypertension, hyperlipidemia, chronic kidney disease, autoimmune hepatitis Admitted on 8/13 with chest pain, dyspnea, rapid atrial fibrillation, AKI, sepsis.  She was in the ICU for 1 day for hypotension requiring pressors briefly.  Treated for pneumonia with antibiotics Also seen by cardiology for atrial fibrillation.  Cardiac catheterization on 8/16 showed no significant coronary artery disease and upper normal left ventricular filling pressure. PCCM was consulted on 8/19 for dyspnea, hypoxia and chest x-ray showing bilateral effusions.  SUBJECTIVE:  States she feel awful Very tired from meds she is receiving States her breathing is a little better after diuresis 8/19  VITAL SIGNS: BP 130/77   Pulse (!) 113   Temp 98.2 F (36.8 C) (Oral)   Resp (!) 27   Ht 5' 2"  (1.575 m)   Wt 86.5 kg   LMP  (LMP Unknown)   SpO2 100%   BMI 34.88 kg/m   HEMODYNAMICS:    VENTILATOR SETTINGS:    INTAKE / OUTPUT: I/O last 3 completed shifts: In: 3 [I.V.:3] Out: 3050 [Urine:3050]  PHYSICAL EXAMINATION: Gen:      No acute distress, pleasant, elderly, OOB in chair, very sleepy HEENT:  EOMI, sclera anicteric, MM Pink and moist Neck:     No masses; no thyromegaly, NCAT Lungs:    Bilateral chest excursion, BS clear and diminished CV:         S1, S2, RRR, NO RMG, A fib per tele Abd:      + bowel sounds; soft, non-tender; no palpable masses, no distension, obese Ext:   2+ bilateral LE edema; adequate peripheral perfusion Skin:      Warm and dry; no rash, no lesions noted Neuro: alert and oriented x 3, MAE x 4 and appropriate Psych: normal mood and affect  LABS:  BMET Recent Labs   Lab 09/03/17 0253 09/04/17 0422 09/05/17 0338  NA 141 141 141  K 4.3 4.2 4.2  CL 111 111 107  CO2 20* 21* 23  BUN 25* 26* 26*  CREATININE 1.35* 1.21* 1.25*  GLUCOSE 149* 105* 128*    Electrolytes Recent Labs  Lab 08/30/17 0034 08/30/17 0521  08/31/17 0018  09/03/17 0253 09/04/17 0422 09/05/17 0338  CALCIUM  --  7.6*   < > 8.0*   < > 8.7* 8.7* 8.8*  MG 2.0  --   --  2.0  --   --   --   --   PHOS  --  3.1  --  2.9  --   --   --   --    < > = values in this interval not displayed.    CBC Recent Labs  Lab 09/03/17 0253 09/04/17 0423 09/05/17 0338  WBC 6.4 6.6 7.3  HGB 12.1 11.4* 11.9*  HCT 39.5 36.8 38.3  PLT 194 227 244    Coag's Recent Labs  Lab 08/29/17 1145 08/31/17 1400 09/01/17 0054 09/01/17 2354  APTT  --  53* 101* 57*  INR 1.65  --   --   --     Sepsis Markers Recent Labs  Lab 08/29/17 1436  08/29/17 2210 08/30/17 0034  08/31/17 0018 09/01/17 0054 09/04/17 1859  LATICACIDVEN 0.66  --  1.2 1.7  --   --   --   --   PROCALCITON  --    < >  --   --    < > 1.18 0.61 0.13   < > = values in this interval not displayed.    ABG No results for input(s): PHART, PCO2ART, PO2ART in the last 168 hours.  Liver Enzymes Recent Labs  Lab 08/29/17 2210 08/30/17 0521  AST 20  --   ALT 26  --   ALKPHOS 53  --   BILITOT 1.0  --   ALBUMIN 2.6* 2.6*    Cardiac Enzymes Recent Labs  Lab 08/30/17 1109 08/31/17 0018 08/31/17 1239  TROPONINI 0.04* 0.40* 0.11*    Glucose Recent Labs  Lab 08/29/17 2330  GLUCAP 124*    Imaging No results found.   STUDIES:  CT chest 09/03/2017-bilateral effusion with atelectasis.  No obvious consolidation.  Cardiac enlargement with small pericardial effusion.  I have reviewed the images personally.  CULTURES: 8/13 RVP >> neg 8/14 MRSA PCR >> neg 8/14 BCx 2>> No Growth 8/14 Urine Streptococcus, Legionella >>  ANTIBIOTICS: ceftriaxone 8/13 >> 8/14 doxycycline >> 8/13 Azithromycin 8/19 >>  SIGNIFICANT  EVENTS: 09/04/2017 PCCM recalled for worsening respiratory status  LINES/TUBES:   DISCUSSION: 82 year old with respiratory failure, pleural effusions in the setting of pneumonia, atrial fibrillation, CHF PCCM called back for intermittent periods of hypoxia, bilateral effusion.  Last CT scan was reviewed per Dr. Vaughan Browner  with notation of small-moderate effusion.  They did not appear to be big enough to  do thoracentesis. No obvious consolidation to suggest parapneumonic effusion Suspect this is secondary to volume overload, CHF Last documented BNP was 1,314.7 on 8/17 PCT is actually down trending  Respiratory failure, bilateral effusion Saturations are 98% on 3 L [place] Trend  chest x-ray Continue Lasix as allowed by renal function per primary team Trend BMET and urine output Continue ceftriaxone for now.  Azithromycin added by primary team Procalcitonin  8/19 >> down trending  Now 0.13 Aggressive pulmonary toilet OOB to chair IS Q 1 while awake Trend BNP to guide diuresis  Pt is very sleepy this morning which is preventing her from being fully engaged in pulmonary toilet. She state she just wants to sleep. Review of her meds shows she received Mucinex DM, Tessalon perles, and Robitussin DM all at 10 am.  I have Changed the Mucinex DM to plain Mucinex for now to see if this helps as she is being given the Robitussin DM  like it is a scheduled medication. She states she has trouble sleeping at night so perhaps some of these medications  will be better given as a prn at bedtime. She did not cough while I was in the room.  Brenda Matthews, AGACNP-BC [place] Pager # [phone] 09/05/2017 10:30 AM

## 2017-09-06 ENCOUNTER — Inpatient Hospital Stay (HOSPITAL_COMMUNITY): Payer: Medicare Other

## 2017-09-06 DIAGNOSIS — J9601 Acute respiratory failure with hypoxia: Secondary | ICD-10-CM

## 2017-09-06 DIAGNOSIS — J9600 Acute respiratory failure, unspecified whether with hypoxia or hypercapnia: Secondary | ICD-10-CM

## 2017-09-06 LAB — CBC
HEMATOCRIT: 38.5 % (ref 36.0–46.0)
Hemoglobin: 12.1 g/dL (ref 12.0–15.0)
MCH: 29.2 pg (ref 26.0–34.0)
MCHC: 31.4 g/dL (ref 30.0–36.0)
MCV: 93 fL (ref 78.0–100.0)
Platelets: 307 10*3/uL (ref 150–400)
RBC: 4.14 MIL/uL (ref 3.87–5.11)
RDW: 14.7 % (ref 11.5–15.5)
WBC: 7.5 10*3/uL (ref 4.0–10.5)

## 2017-09-06 LAB — BASIC METABOLIC PANEL
Anion gap: 11 (ref 5–15)
BUN: 29 mg/dL — ABNORMAL HIGH (ref 8–23)
CO2: 20 mmol/L — ABNORMAL LOW (ref 22–32)
Calcium: 8.5 mg/dL — ABNORMAL LOW (ref 8.9–10.3)
Chloride: 108 mmol/L (ref 98–111)
Creatinine, Ser: 1.35 mg/dL — ABNORMAL HIGH (ref 0.44–1.00)
GFR calc Af Amer: 42 mL/min — ABNORMAL LOW (ref >60)
GFR calc non Af Amer: 36 mL/min — ABNORMAL LOW (ref >60)
Glucose, Bld: 99 mg/dL (ref 70–99)
Potassium: 4.5 mmol/L (ref 3.5–5.1)
Sodium: 139 mmol/L (ref 135–145)

## 2017-09-06 LAB — PROCALCITONIN: Procalcitonin: <0.1 ng/mL

## 2017-09-06 LAB — BRAIN NATRIURETIC PEPTIDE: B Natriuretic Peptide: 654.9 pg/mL — ABNORMAL HIGH (ref 0.0–100.0)

## 2017-09-06 MED ORDER — LORAZEPAM 2 MG/ML IJ SOLN
0.5000 mg | Freq: Once | INTRAMUSCULAR | Status: AC
  Administered 2017-09-06: 0.5 mg via INTRAVENOUS
  Filled 2017-09-06: qty 1

## 2017-09-06 MED ORDER — IPRATROPIUM-ALBUTEROL 0.5-2.5 (3) MG/3ML IN SOLN: 3.0000 mL | RESPIRATORY_TRACT | Status: DC | PRN

## 2017-09-06 MED ORDER — FUROSEMIDE 10 MG/ML IJ SOLN
40.0000 mg | Freq: Two times a day (BID) | INTRAMUSCULAR | Status: DC
  Administered 2017-09-06 – 2017-09-11 (×11): 40 mg via INTRAVENOUS
  Filled 2017-09-06 (×11): qty 4

## 2017-09-06 MED ORDER — LORAZEPAM 2 MG/ML IJ SOLN
1.0000 mg | Freq: Once | INTRAMUSCULAR | Status: AC
  Administered 2017-09-06: 1 mg via INTRAVENOUS
  Filled 2017-09-06: qty 1

## 2017-09-06 NOTE — Progress Notes (Addendum)
Progress Note  Patient Name: Brenda Matthews Date of Encounter: 09/06/2017  Primary Cardiologist: [name], MD   Subjective   Patient reports she did not sleep well again d/t frequent urination 2/2 ongoing lasix diuresis last night. She reports she feels weak. Earlier today she attempted to "tidy up around the room" and felt very SOB and weak while ambulated so had to get back into bed.   Patient is visited today by her two friends / neighbors. She reports she is in better spirits than yesterday, despite the low amount of sleep.  Telemetry 90-low 100s earlier this morning - now mid 80s bpm s/p most recent dose of metoprolol. She continues to deny chest pain.  Inpatient Medications    Scheduled Meds: . amiodarone  200 mg Oral BID  . atorvastatin  40 mg Oral q1800  . azithromycin  500 mg Oral Daily  . fluticasone  2 spray Each Nare Daily  . furosemide  40 mg Intravenous Q8H  . guaiFENesin  1,200 mg Oral BID  . mouth rinse  15 mL Mouth Rinse BID  . metoprolol tartrate  50 mg Oral BID  . rivaroxaban  15 mg Oral Q supper  . sodium chloride flush  3 mL Intravenous Q12H   Continuous Infusions: . sodium chloride    . cefTRIAXone (ROCEPHIN)  IV 1 g (09/05/17 0940)   PRN Meds: sodium chloride, acetaminophen, albuterol, ALPRAZolam, benzonatate, gi cocktail, guaiFENesin-dextromethorphan, ipratropium-albuterol, ondansetron (ZOFRAN) IV, sodium chloride flush   Vital Signs    Vitals:   09/06/17 0032 09/06/17 0230 09/06/17 0342 09/06/17 0811  BP: 98/67   (!) 113/92  Pulse:   97 (!) 107  Resp: (!) 28   (!) 27  Temp: 99.1 F (37.3 C)  98.6 F (37 C) 97.9 F (36.6 C)  TempSrc: Oral  Oral Oral  SpO2: 97%  98% 98%  Weight:  83.9 kg    Height:        Intake/Output Summary (Last 24 hours) at 09/06/2017 1121 Last data filed at 09/06/2017 0500 Gross per 24 hour  Intake 480 ml  Output 1200 ml  Net -720 ml   Filed Weights   09/04/17 0428 09/05/17 0500 09/06/17 0230  Weight:  82.9 kg 86.5 kg 83.9 kg    Telemetry    IRIR with HR 80s to low 100s- Personally Reviewed  ECG    N/A- Personally Reviewed  Physical Exam   Physical Exam  Constitutional: She is oriented to person, place, and time.  Elderly frail woman  HENT:  Head: Normocephalic and atraumatic.  Cardiovascular: Intact distal pulses. Exam reveals no gallop and no friction rub.  No murmur heard. IRIR  Pulmonary/Chest: She exhibits no tenderness.  diminished breath sounds with E to A egophony in the left base.    Abdominal: She exhibits distension. She exhibits no mass. There is no tenderness. There is no rebound and no guarding.  Musculoskeletal: Normal range of motion. She exhibits edema. She exhibits no tenderness or deformity.  2+ edema with compression stockings  Neurological: She is oriented to person, place, and time.  Oriented to person, time, place. Disorientation reported with medication.  Skin: Skin is warm and dry. Rash noted. No erythema. No pallor.  Rash noted, per IM  Psychiatric: She has a normal mood and affect. Her behavior is normal. Judgment and thought content normal.     Labs    Chemistry Recent Labs  Lab 09/04/17 0422 09/05/17 0338 09/06/17 0322  NA 141  141 139  K 4.2 4.2 4.5  CL 111 107 108  CO2 21* 23 20*  GLUCOSE 105* 128* 99  BUN 26* 26* 29*  CREATININE 1.21* 1.25* 1.35*  CALCIUM 8.7* 8.8* 8.5*  GFRNONAA 41* 40* 36*  GFRAA 48* 46* 42*  ANIONGAP 9 11 11      Hematology Recent Labs  Lab 09/04/17 0423 09/05/17 0338 09/06/17 0322  WBC 6.6 7.3 7.5  RBC 3.92 4.09 4.14  HGB 11.4* 11.9* 12.1  HCT 36.8 38.3 38.5  MCV 93.9 93.6 93.0  MCH 29.1 29.1 29.2  MCHC 31.0 31.1 31.4  RDW 15.2 15.0 14.7  PLT 227 244 307    Cardiac Enzymes Recent Labs  Lab 08/31/17 0018 08/31/17 1239  TROPONINI 0.40* 0.11*   No results for input(s): TROPIPOC in the last 168 hours.   BNP Recent Labs  Lab 09/02/17 0336 09/06/17 0322  BNP 1,314.7* 654.9*      DDimer No results for input(s): DDIMER in the last 168 hours.   Radiology    Dg Chest 2 View  Result Date: 09/06/2017 CLINICAL DATA:  Acute respiratory failure. Shortness of breath and weakness. EXAM: CHEST - 2 VIEW COMPARISON:  09/04/2017 FINDINGS: Bilateral effusions persist with atelectasis in both lower lungs. No gross increase or decrease. Upper lungs remain well aerated. Chronically enlarged cardiac silhouette and aortic atherosclerosis. IMPRESSION: Bilateral effusions and dependent pulmonary atelectasis, similar to the appearance of 2 days ago. Electronically Signed   By: Nelson Chimes M.D.   On: 09/06/2017 08:04    Cardiac Studies   Echocardiogram 08/10/2017: Study Conclusions - Left ventricle: The cavity size was normal. Wall thickness was increased in a pattern of mild LVH. Systolic function was normal. The estimated ejection fraction was in the range of 50% to 55%. Wall motion was normal; there were no regional wall motion abnormalities. - Aortic valve: There was trivial regurgitation. - Aortic root: The aortic root was mildly dilated. - Mitral valve: There was mild regurgitation. - Left atrium: The atrium was severely dilated. - Right atrium: The atrium was mildly dilated. - Tricuspid valve: There was mild-moderate regurgitation. - Pulmonary arteries: PA peak pressure: 32 mm Hg (S). - Pericardium, extracardiac: A trivial pericardial effusion was identified. Impressions: - Normal LV systolic function; mild LVH; trace AI; mildly dilated aortic root; mild MR; biatrial enlargement; mild to moderate TR.  09/01/2017 LHC and coronary angiography Conclusions: 1. No angiographically significant coronary artery disease. 2. Upper normal left ventricular filling pressure  Recommendations: 1. Continue medical therapy of diastolic heart failure and atrial fibrillation. 2. Gentle post-catheterization hydration given resolved acute kidney injury. 3. Restart heparin  infusion 2 hours after TR band removal; rivaroxaban could be restarted as soon as tomorrow. Recommend to resume Rivaroxaban, at currently prescribed dose and frequency, on 09/01/17. Concurrent antiplatelet therapy not recommended.  Patient Profile     82 y.o. female with medical history significantfor paroxysmal A. Fib on anticoagulation (Xarelto), hypertension, hyperlipidemia, chronic renal insufficiency stage II, autoimmune hepatitis presents to the emergency department with the chief complaint of chest pain shortness of breath generalized weakness.  Assessment & Plan    1. Chest pain in setting of PNA & paroxysmal Afib with RVR on anticoagulation - No current CP reported. - HR 80s - low 100s, IRIR. - Cardiac imaging as above: 07/2017 echo EF 50-55% with trivial pericardial effusion. LHC with recommendation for medical.  - Current plan to hold off on DCCV until stable from PNA as below.  - Echo (07/2017) as  above suggests biatrial enlargement - will DCCV hold? Patient reports she has not yet attempted DCCV in the past. - Continue Amiodarone 200 mg bid po, Xarelto 17m po qd and metoprolol 524mbid.  - Tachycardic rates likely due to pulmonary issues and may improve with resolution of illness / PNA; will hold off increasing  blocker for now. - CHA2DS2VASc at least 4 (agex2, female, HTN). Continue anticoagulation.   2. Hypotension 2/2 sepsis / PNA - S/p ICU 2/2 hypotension.  - Initial CXR as above shows PNA, likely driving force behind SOB v. Afib. - Most recent BP 113/92 with elevated diastolic, otherwise stable. - Per IM.  3. Acute hypoxic respiratory failure  - Chest CT as above - b/l pleural effusions with bibasilar atelectasis.  - Repeat 8/21 CXR as above and noted as similar to the appearance of 2 days ago. - 1314 BNP at admission  8/21 BNP 654.9 - improving. - Weight down 5lb from yesterday--- still above baseline.  - Wt 167 lb  182 lb  190.69 lb  185 lb (09/06/17) - Strict  I/O. Daily weights. - Continue IV Lasix 4061m8h.  - No known plans for thoracentesis at this time - per pulmonology / IM.  4. Essential HTN  - BP Stable 121/87  125/80  123/89  121/88  113/92. - Continue Metoprolol 18m61md in setting of illness. As above, considered increase of ? blocker. Current plan to hold off any adjustment in dosage d/t overnight hypotension and in setting of PNA /illness. - Holding Losartan in setting of illness / renal function. - Cr 1.35  1.21  1.25  1.35.  - Continue to monitor renal function as improves & as above.  5. HLD - Continue atorvastatin 40mg42mq1800. - Monitor lipid and liver function as outpatient.  6. Acute on chronic CKD, III - SCr bump from yesterday: 8/20 Cr 1.25  1.35 on 8/21. - K+ 4.2.  4.5 today. Continue to monitor. - Daily BMET.  - Continue to monitor renal function and electrolytes.  7. Anemia, in setting of chronic disease - Hgb, improving: 11.4  11.9  12.1; RBC 3.92  4.09  4.14. - Daily CBC. - Continue to monitor.  8. Rash - Per IM  For questions or updates, please contact [facility] consult [email or web address] for contact info under Cardiology/STEMI.      SigneDorthula NettlesC  Pager [phone]/2019, 11:21 AM    Attending Note:   The patient was seen and examined.  Agree with assessment and plan as noted above.  Changes made to the above note as needed.  Patient seen and independently examined with JacquMarrianne Mood.   We discussed all aspects of the encounter. I agree with the assessment and plan as stated above.  1.  1.  Respiratory failure: The patient has had pneumonia and now has bilateral pleural effusions.  She has consolidation in her left lung field on exam.  Urged her to use the incentive spirometer.  Her breath sounds may have improved a little bit from yesterday but she still needs lots of work to open up her lungs.  2.  Atrial fibrillation: Her A. fib rate is better  controlled.  To new amiodarone. We can consider cardioversion but I do not think that her mildly tachycardic rate is causing her respiratory distress or her heart failure symptoms.  3.  Acute on chronic presumed diastolic congestive heart failure: Echocardiogram reveals normal left ventricular systolic function.  She  has no significant coronary artery disease by cath.  Continue Lasix.  4.  Acute renal insufficiency: Her creatinine is starting to increase slightly.  We will decrease the Lasix to twice a day from 3 times a day.    I have spent a total of 40 minutes with patient reviewing hospital  notes , telemetry, EKGs, labs and examining patient as well as establishing an assessment and plan that was discussed with the patient. > 50% of time was spent in direct patient care.    Thayer Headings, Brooke Bonito., MD, [facility] 09/06/2017, 6:19 PM [address]  [place] Pager [phone]

## 2017-09-06 NOTE — Progress Notes (Signed)
Notified practitioner of patient being tearful from being so tired, RR 40, Sats 98% on 3L.  Applied purewik and orders received, meds administered.

## 2017-09-06 NOTE — Care Management Note (Signed)
Case Management Note  Patient Details  Name: Brenda Matthews MRN: [id number] Date of Birth: 02-12-1935  Subjective/Objective:                 Admitted w sepsis and PNA   Action/Plan:  82 year old female admitted from home alone. Patient verbalizes that she wants to go to SNF, [facility], prior to returning home. Notified Dr Broadus John and placed PT OT evals as required for potential SNF placement. Placed CSW consult as well.  Expected Discharge Date:                  Expected Discharge Plan:  [place]  In-House Referral:     Discharge planning Services  CM Consult  Post Acute Care Choice:    Choice offered to:     DME Arranged:    DME Agency:     HH Arranged:    [place] Agency:     Status of Service:  In process, will continue to follow  If discussed at Long Length of Stay Meetings, dates discussed:    Additional Comments:  Carles Collet, RN 09/06/2017, 11:46 AM

## 2017-09-06 NOTE — Progress Notes (Signed)
PULMONARY / CRITICAL CARE MEDICINE   Name: Brenda Matthews MRN: [id number] DOB: [birth year]    ADMISSION DATE:  08/29/2017   CONSULTATION DATE: 09/04/17  REFERRING MD:   Rudolpho Sevin MD  CHIEF COMPLAINT: Bilateral pleural effusion  HISTORY OF PRESENT ILLNESS:   82 year old with history of atrial fibrillation, CHF, hypertension, hyperlipidemia, chronic kidney disease, autoimmune hepatitis Admitted on 8/13 with chest pain, dyspnea, rapid atrial fibrillation, AKI, sepsis.  She was in the ICU for 1 day for hypotension requiring pressors briefly.  Treated for pneumonia with antibiotics Also seen by cardiology for atrial fibrillation.  Cardiac catheterization on 8/16 showed no significant coronary artery disease and upper normal left ventricular filling pressure. PCCM was consulted on 8/19 for dyspnea, hypoxia and chest x-ray showing bilateral effusions.  SUBJECTIVE:  Reports feeling better with Lasix  VITAL SIGNS: BP (!) 113/92 (BP Location: Right Arm)   Pulse (!) 107   Temp 97.9 F (36.6 C) (Oral)   Resp (!) 27   Ht 5' 2"  (1.575 m)   Wt 83.9 kg   LMP  (LMP Unknown)   SpO2 98%   BMI 33.84 kg/m   HEMODYNAMICS:    VENTILATOR SETTINGS:    INTAKE / OUTPUT: I/O last 3 completed shifts: In: 51 [P.O.:480; I.V.:3] Out: 2600 [Urine:2600]  PHYSICAL EXAMINATION: General: Awake alert somewhat of a dull effect no verbal complaints.  Reports being better today than yesterday HEENT: No JVD or lymphadenopathy is appreciated Neuro: Alert and dull effect awake alert no acute distress CV: Heart sounds are regular ventricular rate of 92 PULM: Creased breath sounds throughout FF:MBWG, non-tender, bsx4 active  Extremities: warm/dry, 1+ edema  Skin: no rashes or lesions   LABS:  BMET Recent Labs  Lab 09/04/17 0422 09/05/17 0338 09/06/17 0322  NA 141 141 139  K 4.2 4.2 4.5  CL 111 107 108  CO2 21* 23 20*  BUN 26* 26* 29*  CREATININE 1.21* 1.25* 1.35*  GLUCOSE 105* 128* 99     Electrolytes Recent Labs  Lab 08/31/17 0018  09/04/17 0422 09/05/17 0338 09/06/17 0322  CALCIUM 8.0*   < > 8.7* 8.8* 8.5*  MG 2.0  --   --   --   --   PHOS 2.9  --   --   --   --    < > = values in this interval not displayed.    CBC Recent Labs  Lab 09/04/17 0423 09/05/17 0338 09/06/17 0322  WBC 6.6 7.3 7.5  HGB 11.4* 11.9* 12.1  HCT 36.8 38.3 38.5  PLT 227 244 307    Coag's Recent Labs  Lab 08/31/17 1400 09/01/17 0054 09/01/17 2354  APTT 53* 101* 57*    Sepsis Markers Recent Labs  Lab 09/01/17 0054 09/04/17 1859 09/05/17 0338  PROCALCITON 0.61 0.13 <0.10    ABG No results for input(s): PHART, PCO2ART, PO2ART in the last 168 hours.  Liver Enzymes No results for input(s): AST, ALT, ALKPHOS, BILITOT, ALBUMIN in the last 168 hours.  Cardiac Enzymes Recent Labs  Lab 08/31/17 0018 08/31/17 1239  TROPONINI 0.40* 0.11*    Glucose No results for input(s): GLUCAP in the last 168 hours.  Imaging Dg Chest 2 View  Result Date: 09/06/2017 CLINICAL DATA:  Acute respiratory failure. Shortness of breath and weakness. EXAM: CHEST - 2 VIEW COMPARISON:  09/04/2017 FINDINGS: Bilateral effusions persist with atelectasis in both lower lungs. No gross increase or decrease. Upper lungs remain well aerated. Chronically enlarged cardiac silhouette and aortic atherosclerosis. IMPRESSION:  Bilateral effusions and dependent pulmonary atelectasis, similar to the appearance of 2 days ago. Electronically Signed   By: Nelson Chimes M.D.   On: 09/06/2017 08:04  Chest x-ray 8/21 reveals bilateral effusions and edema, reviewed by S. Brodie Correll   STUDIES:  CT chest 09/03/2017-bilateral effusion with atelectasis.  No obvious consolidation.  Cardiac enlargement with small pericardial effusion.  I have reviewed the images personally.  CULTURES: 8/13 RVP >> neg 8/14 MRSA PCR >> neg 8/14 BCx 2>> No Growth 8/14 Urine Streptococcus, Legionella >>  ANTIBIOTICS: ceftriaxone 8/13  >> 8/14 doxycycline >> 8/13 Azithromycin 8/19 >>  SIGNIFICANT EVENTS: 09/04/2017 PCCM recalled for worsening respiratory status  LINES/TUBES:   DISCUSSION: 82 year old with respiratory failure, pleural effusions in the setting of pneumonia, atrial fibrillation, CHF PCCM called back for intermittent periods of hypoxia, bilateral effusion.  Last CT scan was reviewed per Dr. Vaughan Browner  with notation of small-moderate effusion.  They did not appear to be big enough to  do thoracentesis. No obvious consolidation to suggest parapneumonic effusion Suspect this is secondary to volume overload, CHF Last documented BNP was 1,314.7 on 8/17 PCT is actually down trending  Respiratory failure, bilateral effusion O2 saturations are 96% on 3 L nasal cannula  Intake/Output Summary (Last 24 hours) at 09/06/2017 1113 Last data filed at 09/06/2017 0500 Gross per 24 hour  Intake 480 ml  Output 1200 ml  Net -720 ml    Maintain negative I&O Diuresis as tolerated Continue antimicrobial therapy Pulmonary toilet O2 as needed Control ventricular rate in the setting of atrial fibrillation    Richardson Landry Shandria Clinch ACNP Maryanna Shape PCCM Pager [phone] till 1 pm If no answer page [phone] 09/06/2017, 11:12 AM  09/06/2017 11:12 AM

## 2017-09-06 NOTE — Progress Notes (Signed)
Pt tearful at not getting any sleep because of having to get up to the bathroom over night. Pt states that she "doesn't feel its working". Held pt morning dose of lasix per patient request. Will pass on to day shift.

## 2017-09-06 NOTE — Progress Notes (Signed)
PROGRESS NOTE    Brenda Matthews  VVO:[id number] DOB: [birth year] DOA: 08/29/2017 PCP: [name], MD    Brief Narrative: Brenda Matthews is a very pleasant 82 y.o. female with medical history significant for paroxysmal A. Fib on anticoagulation, hypertension, hyperlipidemia, chronic renal insufficiency stage II, autoimmune hepatitis presents to the emergency department with the chief complaint of chest pain shortness of breath generalized weakness. Initial evaluation reveals max temp of 100.6, EKG with atrial fibrillation and heart rate of 106, acute kidney injury.  Patient was transfered to ICU due to hypotension. She received IV bolus, started on neo/. Now off pressors. Diagnosed with PNA. Started on ceftriaxone. Renal function improving. HR elevated, resumed metoprolol.  Transfered to step down unit.   Patient continue to have SOB, hypoxemia, suspect is related to PNA, pleural effusion. Lasix change to 40 mg IV every 8 hours. Pulmonology consulted, patient was not improving. Plan is for follow up chest x ray.     Assessment & Plan:   1-Hypotension;  cardiogenic vs sepsis.  Received IV bolus. Was on neo for one overnight.  -Chest x ray with PNA, started  IV antibiotics. Ceftriaxone day 7 now -Blood cultures. No growth.  -Pro-calcitonin and lactic acid normal.  -Chest x ray with PNA, started  IV antibiotics. Ceftriaxone day 7 now -improved from this standpoint -stop ceftriaxone after today's dose  2-Acute Hypoxic Respiratory failure Multifactorial related to PNA, pleural effusion, pulmonary edema post fluid resuscitation.  Dyspnea; EKG changes, mild elevation troponin.  -Cardiology consulted. Underwent cath; non obstructive  -8-17;Worsening hypoxemia and dyspnea. Repeated chest x ray stable. Persistent atelectasis infiltrates. CT chest with small pleural effusion, left LE consolidation. Seen by pulmonary, recommended IV lasix, incentive spirometry and complete course of antibiotics,   Needs repeat CT chest in 3-to 6 months. -8-18 improved dyspnea.  -8-19; increase work of breathing, worsening dyspnea. Repeated chest x ray worsening bilateral pleural effusion and atelectasis.  -Lasix dose increased to 40 mg every 8 -ambulate, out of bed, PT OT  3-Pneumonia;  -see above  4-Rash; Dr Chase Caller consulted surgery. Vasculitis panel RF 22, vasculitis panel negative Rash improving, hold on biopsy.   4-Paroxysmal A fib;  Continue with amiodarone and xarelto.  Resumed metoprolol, now that BP has improved.  Cardiology following  5-HTN; hold losartan.   6-Hyperlipidemia; continue with statins.   7-Acute on chronic renal failure stage II; Avoid hypotension.  Renal US negative for hydronephrosis.  Stable.   8-History of NASH;   DVT prophylaxis:on xarelto.  Code Status: Full code.  Family Communication: care discussed with patient.  Disposition Plan: remains in stepdown   Consultants:   Cardiology  CCM   Procedures:   Antimicrobials: Ceftriaxone 8-14  Subjective: -tired today, didn't get any sleep or last night due to overnight Lasix dose Objective: Vitals:   09/06/17 0032 09/06/17 0230 09/06/17 0342 09/06/17 0811  BP: 98/67   (!) 113/92  Pulse:   97 (!) 107  Resp: (!) 28   (!) 27  Temp: 99.1 F (37.3 C)  98.6 F (37 C) 97.9 F (36.6 C)  TempSrc: Oral  Oral Oral  SpO2: 97%  98% 98%  Weight:  83.9 kg    Height:        Intake/Output Summary (Last 24 hours) at 09/06/2017 1219 Last data filed at 09/06/2017 0500 Gross per 24 hour  Intake 240 ml  Output 1200 ml  Net -960 ml   Filed Weights   09/04/17 0428 09/05/17 0500 09/06/17 0230  Weight: 82.9 kg 86.5 kg 83.9 kg    Examination:  Gen: Awake, Alert, Oriented X 3, week/ill-appearing HEENT: PERRLA, Neck supple Lungs: few scattered rales, decreased breath sounds at the bases CVS: RRR,No Gallops,Rubs or new Murmurs Abd: soft, Non tender, non distended, BS present Extremities: trace  edema Skin: mild maculopapular rash improving  Data Reviewed: I have personally reviewed following labs and imaging studies  CBC: Recent Labs  Lab 08/31/17 0018 09/01/17 0054 09/02/17 0336 09/03/17 0253 09/04/17 0423 09/05/17 0338 09/06/17 0322  WBC 9.5 7.6 6.3 6.4 6.6 7.3 7.5  NEUTROABS 7.4 5.8 4.7 4.9  --   --   --   HGB 12.8 12.3 11.6* 12.1 11.4* 11.9* 12.1  HCT 41.0 38.7 37.4 39.5 36.8 38.3 38.5  MCV 91.9 91.9 93.5 94.7 93.9 93.6 93.0  PLT 113* 154 152 194 227 244 144   Basic Metabolic Panel: Recent Labs  Lab 08/31/17 0018  09/02/17 0336 09/03/17 0253 09/04/17 0422 09/05/17 0338 09/06/17 0322  NA 137   < > 139 141 141 141 139  K 3.5   < > 3.6 4.3 4.2 4.2 4.5  CL 103   < > 110 111 111 107 108  CO2 19*   < > 19* 20* 21* 23 20*  GLUCOSE 119*   < > 140* 149* 105* 128* 99  BUN 33*   < > 27* 25* 26* 26* 29*  CREATININE 1.92*   < > 1.28* 1.35* 1.21* 1.25* 1.35*  CALCIUM 8.0*   < > 8.1* 8.7* 8.7* 8.8* 8.5*  MG 2.0  --   --   --   --   --   --   PHOS 2.9  --   --   --   --   --   --    < > = values in this interval not displayed.   GFR: Estimated Creatinine Clearance: 33.4 mL/min (A) (by C-G formula based on SCr of 1.35 mg/dL (H)). Liver Function Tests: No results for input(s): AST, ALT, ALKPHOS, BILITOT, PROT, ALBUMIN in the last 168 hours. No results for input(s): LIPASE, AMYLASE in the last 168 hours. No results for input(s): AMMONIA in the last 168 hours. Coagulation Profile: No results for input(s): INR, PROTIME in the last 168 hours. Cardiac Enzymes: Recent Labs  Lab 08/31/17 0018 08/31/17 1239  TROPONINI 0.40* 0.11*   BNP (last 3 results) Recent Labs    08/01/17 1017 08/04/17 1447  PROBNP 631.0* 574.0*   HbA1C: No results for input(s): HGBA1C in the last 72 hours. CBG: No results for input(s): GLUCAP in the last 168 hours. Lipid Profile: No results for input(s): CHOL, HDL, LDLCALC, TRIG, CHOLHDL, LDLDIRECT in the last 72 hours. Thyroid Function  Tests: No results for input(s): TSH, T4TOTAL, FREET4, T3FREE, THYROIDAB in the last 72 hours. Anemia Panel: No results for input(s): VITAMINB12, FOLATE, FERRITIN, TIBC, IRON, RETICCTPCT in the last 72 hours. Sepsis Labs: Recent Labs  Lab 09/01/17 0054 09/04/17 1859 09/05/17 0338 09/06/17 0933  PROCALCITON 0.61 0.13 <0.10 <0.10    Recent Results (from the past 240 hour(s))  Respiratory Panel by PCR     Status: None   Collection Time: 08/29/17  4:30 PM  Result Value Ref Range Status   Adenovirus NOT DETECTED NOT DETECTED Final   Coronavirus 229E NOT DETECTED NOT DETECTED Final   Coronavirus HKU1 NOT DETECTED NOT DETECTED Final   Coronavirus NL63 NOT DETECTED NOT DETECTED Final   Coronavirus OC43 NOT DETECTED NOT DETECTED Final  Metapneumovirus NOT DETECTED NOT DETECTED Final   Rhinovirus / Enterovirus NOT DETECTED NOT DETECTED Final   Influenza A NOT DETECTED NOT DETECTED Final   Influenza B NOT DETECTED NOT DETECTED Final   Parainfluenza Virus 1 NOT DETECTED NOT DETECTED Final   Parainfluenza Virus 2 NOT DETECTED NOT DETECTED Final   Parainfluenza Virus 3 NOT DETECTED NOT DETECTED Final   Parainfluenza Virus 4 NOT DETECTED NOT DETECTED Final   Respiratory Syncytial Virus NOT DETECTED NOT DETECTED Final   Bordetella pertussis NOT DETECTED NOT DETECTED Final   Chlamydophila pneumoniae NOT DETECTED NOT DETECTED Final   Mycoplasma pneumoniae NOT DETECTED NOT DETECTED Final    Comment: Performed at [facility], [place] [address], [address] [place] [address], [place] [address]  MRSA PCR Screening     Status: None   Collection Time: 08/29/17 11:29 PM  Result Value Ref Range Status   MRSA by PCR NEGATIVE NEGATIVE Final    Comment:        The GeneXpert MRSA Assay (FDA approved for NASAL specimens only), is one component of a comprehensive MRSA colonization surveillance program. It is not intended to diagnose MRSA infection nor to guide or monitor treatment for MRSA infections. Performed at  [facility], [place] [address], [place], [place] [address]   Culture, blood (routine x 2)     Status: None   Collection Time: 08/30/17 12:25 AM  Result Value Ref Range Status   Specimen Description BLOOD LEFT HAND  Final   Special Requests   Final    BOTTLES DRAWN AEROBIC AND ANAEROBIC Blood Culture adequate volume   Culture   Final    NO GROWTH 5 DAYS Performed at [facility], [place] [address], [place], [place] [address]    Report Status 09/04/2017 FINAL  Final  Culture, blood (routine x 2)     Status: None   Collection Time: 08/30/17 12:30 AM  Result Value Ref Range Status   Specimen Description BLOOD RIGHT ANTECUBITAL  Final   Special Requests   Final    BOTTLES DRAWN AEROBIC ONLY Blood Culture adequate volume   Culture   Final    NO GROWTH 5 DAYS Performed at [facility], [place] [address], [place],  [address]    Report Status 09/04/2017 FINAL  Final         Radiology Studies: Dg Chest 2 View  Result Date: 09/06/2017 CLINICAL DATA:  Acute respiratory failure. Shortness of breath and weakness. EXAM: CHEST - 2 VIEW COMPARISON:  09/04/2017 FINDINGS: Bilateral effusions persist with atelectasis in both lower lungs. No gross increase or decrease. Upper lungs remain well aerated. Chronically enlarged cardiac silhouette and aortic atherosclerosis. IMPRESSION: Bilateral effusions and dependent pulmonary atelectasis, similar to the appearance of 2 days ago. Electronically Signed   By: Nelson Chimes M.D.   On: 09/06/2017 08:04        Scheduled Meds: . amiodarone  200 mg Oral BID  . atorvastatin  40 mg Oral q1800  . azithromycin  500 mg Oral Daily  . fluticasone  2 spray Each Nare Daily  . furosemide  40 mg Intravenous Q8H  . guaiFENesin  1,200 mg Oral BID  . mouth rinse  15 mL Mouth Rinse BID  . metoprolol tartrate  50 mg Oral BID  . rivaroxaban  15 mg Oral Q supper  . sodium chloride flush  3 mL Intravenous Q12H   Continuous Infusions: . sodium  chloride    . cefTRIAXone (ROCEPHIN)  IV 1 g (09/06/17 1141)     LOS: 7 days    Time spent: 35 minutes.     Domenic Polite, MD [facility]  Page via [email or web address] If 7PM-7AM, please contact night-coverage [email or web address] Password [facility] 09/06/2017, 12:19 PM

## 2017-09-07 LAB — CBC
HEMATOCRIT: 39 % (ref 36.0–46.0)
Hemoglobin: 11.7 g/dL — ABNORMAL LOW (ref 12.0–15.0)
MCH: 28.5 pg (ref 26.0–34.0)
MCHC: 30 g/dL (ref 30.0–36.0)
MCV: 95.1 fL (ref 78.0–100.0)
Platelets: 300 10*3/uL (ref 150–400)
RBC: 4.1 MIL/uL (ref 3.87–5.11)
RDW: 14.6 % (ref 11.5–15.5)
WBC: 8.4 10*3/uL (ref 4.0–10.5)

## 2017-09-07 LAB — BASIC METABOLIC PANEL
Anion gap: 9 (ref 5–15)
BUN: 30 mg/dL — AB (ref 8–23)
CALCIUM: 8.7 mg/dL — AB (ref 8.9–10.3)
CO2: 26 mmol/L (ref 22–32)
CREATININE: 1.3 mg/dL — AB (ref 0.44–1.00)
Chloride: 108 mmol/L (ref 98–111)
GFR calc Af Amer: 44 mL/min — ABNORMAL LOW (ref >60)
GFR calc non Af Amer: 38 mL/min — ABNORMAL LOW (ref >60)
GLUCOSE: 123 mg/dL — AB (ref 70–99)
Potassium: 4 mmol/L (ref 3.5–5.1)
Sodium: 143 mmol/L (ref 135–145)

## 2017-09-07 MED ORDER — RIVAROXABAN 15 MG PO TABS
15.0000 mg | ORAL_TABLET | Freq: Every day | ORAL | Status: DC
Start: 2017-09-08 — End: 2017-09-07

## 2017-09-07 NOTE — Clinical Social Work Note (Signed)
Clinical Social Work Assessment  Patient Details  Name: Brenda Matthews MRN: [id number] Date of Birth: [birth year]  Date of referral:  09/07/17               Reason for consult:  Facility Placement, Discharge Planning                Permission sought to share information with:  Facility Sport and exercise psychologist, Family Supports Permission granted to share information::  Yes, Verbal Permission Granted  Name::     Linn Clavin  Agency::  [facility]NFs  Relationship::  son  Contact Information:  [phone]  Housing/Transportation Living arrangements for the past 2 months:  [place] of Information:  Patient Patient Interpreter Needed:  None Criminal Activity/Legal Involvement Pertinent to Current Situation/Hospitalization:  No - Comment as needed Significant Relationships:  Adult Children Lives with:  Self Do you feel safe going back to the place where you live?  Yes Need for family participation in patient care:  No (Coment)  Care giving concerns: Patient from home independently. PT recommending SNF.   Social Worker assessment / plan: CSW met with patient at bedside. Patient alert and oriented, sitting up in bedside chair. CSW discussed disposition planning - PT recommendation for SNF. Patient agreeable to SNF and has been to [facility] before, but is open to any SNF. CSW sent out initial SNF referrals, awaiting bed offers. Will provide bed offers when available. Patient will require [facility] authorization before admitting to the facility. CSW to follow and support with discharge planning.   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  Patient/Family's Understanding of and Emotional Response to Diagnosis, Current Treatment, and Prognosis: Patient with understanding of her  condition and agreeable to SNF.  Emotional Assessment Appearance:  Appears stated age Attitude/Demeanor/Rapport:  Engaged Affect (typically observed):  Accepting, Calm, Appropriate, Pleasant Orientation:  Oriented to Self, Oriented to Place, Oriented to  Time, Oriented to Situation Alcohol / Substance use:  Not Applicable Psych involvement (Current and /or in the community):  No (Comment)  Discharge Needs  Concerns to be addressed:  Discharge Planning Concerns, Care Coordination Readmission within the last 30 days:  No Current discharge risk:  Physical Impairment Barriers to Discharge:  Continued Medical Work up   Estanislado Emms, LCSW 09/07/2017, 3:33 PM

## 2017-09-07 NOTE — Progress Notes (Addendum)
Progress Note  Patient Name: Brenda Matthews Date of Encounter: 09/07/2017  Primary Cardiologist: Dr. Lyman Bishop, MD   Subjective   Pt lethargic this AM. Denies chest pain. HR improved in the 70-80's with rest.   Inpatient Medications    Scheduled Meds: . amiodarone  200 mg Oral BID  . atorvastatin  40 mg Oral q1800  . azithromycin  500 mg Oral Daily  . fluticasone  2 spray Each Nare Daily  . furosemide  40 mg Intravenous Q12H  . guaiFENesin  1,200 mg Oral BID  . mouth rinse  15 mL Mouth Rinse BID  . metoprolol tartrate  50 mg Oral BID  . [START ON 09/08/2017] rivaroxaban  15 mg Oral Q supper  . sodium chloride flush  3 mL Intravenous Q12H   Continuous Infusions: . sodium chloride    . cefTRIAXone (ROCEPHIN)  IV 1 g (09/07/17 0932)   PRN Meds: sodium chloride, acetaminophen, albuterol, ALPRAZolam, benzonatate, gi cocktail, guaiFENesin-dextromethorphan, ipratropium-albuterol, ondansetron (ZOFRAN) IV, sodium chloride flush   Vital Signs    Vitals:   09/06/17 2342 09/07/17 0025 09/07/17 0424 09/07/17 0819  BP: (!) 130/91  (!) 146/85 133/88  Pulse: 90  (!) 104 (!) 118  Resp: (!) 32 (!) 25 (!) 35 (!) 38  Temp: (!) 97.4 F (36.3 C)  (!) 97.5 F (36.4 C) (!) 96.8 F (36 C)  TempSrc: Oral  Oral Axillary  SpO2: 96% 96% 95% 98%  Weight:   85.1 kg   Height:        Intake/Output Summary (Last 24 hours) at 09/07/2017 1040 Last data filed at 09/07/2017 0819 Gross per 24 hour  Intake -  Output 1150 ml  Net -1150 ml   Filed Weights   09/05/17 0500 09/06/17 0230 09/07/17 0424  Weight: 86.5 kg 83.9 kg 85.1 kg    Physical Exam   General: Elderly, NAD Skin: Warm, dry, intact  Head: Normocephalic, atraumatic, clear, moist mucus membranes. Neck: Negative for carotid bruits. No JVD Lungs:Clear to ausculation bilaterally. No wheezes, rales, or rhonchi. Breathing is unlabored. Cardiovascular: Irregularly irregular with S1 S2. No murmurs, rubs, gallops, or LV heave  appreciated. Abdomen: Soft, non-tender, non-distended with normoactive bowel sounds.No obvious abdominal masses. MSK: Strength and tone appear normal for age. 5/5 in all extremities Extremities: 1-2+ BLE edema. No clubbing or cyanosis. DP/PT pulses 2+ bilaterally Neuro: Alert and oriented, lethargic. No focal deficits. No facial asymmetry. MAE spontaneously. Psych: Responds to questions appropriately with normal affect.    Labs    Chemistry Recent Labs  Lab 09/05/17 0338 09/06/17 0322 09/07/17 0322  NA 141 139 143  K 4.2 4.5 4.0  CL 107 108 108  CO2 23 20* 26  GLUCOSE 128* 99 123*  BUN 26* 29* 30*  CREATININE 1.25* 1.35* 1.30*  CALCIUM 8.8* 8.5* 8.7*  GFRNONAA 40* 36* 38*  GFRAA 46* 42* 44*  ANIONGAP 11 11 9      Hematology Recent Labs  Lab 09/05/17 0338 09/06/17 0322 09/07/17 0322  WBC 7.3 7.5 8.4  RBC 4.09 4.14 4.10  HGB 11.9* 12.1 11.7*  HCT 38.3 38.5 39.0  MCV 93.6 93.0 95.1  MCH 29.1 29.2 28.5  MCHC 31.1 31.4 30.0  RDW 15.0 14.7 14.6  PLT 244 307 300    Cardiac Enzymes Recent Labs  Lab 08/31/17 1239  TROPONINI 0.11*   No results for input(s): TROPIPOC in the last 168 hours.   BNP Recent Labs  Lab 09/02/17 0336 09/06/17 2836  BNP 1,314.7* 654.9*     DDimer No results for input(s): DDIMER in the last 168 hours.   Radiology    Dg Chest 2 View  Result Date: 09/06/2017 CLINICAL DATA:  Acute respiratory failure. Shortness of breath and weakness. EXAM: CHEST - 2 VIEW COMPARISON:  09/04/2017 FINDINGS: Bilateral effusions persist with atelectasis in both lower lungs. No gross increase or decrease. Upper lungs remain well aerated. Chronically enlarged cardiac silhouette and aortic atherosclerosis. IMPRESSION: Bilateral effusions and dependent pulmonary atelectasis, similar to the appearance of 2 days ago. Electronically Signed   By: Nelson Chimes M.D.   On: 09/06/2017 08:04    Telemetry    09/07/17 AF with HR in the 70-90's- Personally Reviewed  ECG      No new tracing as of 09/07/17- Personally Reviewed  Cardiac Studies   Echocardiogram 08/10/2017: Study Conclusions - Left ventricle: The cavity size was normal. Wall thickness was increased in a pattern of mild LVH. Systolic function was normal. The estimated ejection fraction was in the range of 50% to 55%. Wall motion was normal; there were no regional wall motion abnormalities. - Aortic valve: There was trivial regurgitation. - Aortic root: The aortic root was mildly dilated. - Mitral valve: There was mild regurgitation. - Left atrium: The atrium was severely dilated. - Right atrium: The atrium was mildly dilated. - Tricuspid valve: There was mild-moderate regurgitation. - Pulmonary arteries: PA peak pressure: 32 mm Hg (S). - Pericardium, extracardiac: A trivial pericardial effusion was identified. Impressions: - Normal LV systolic function; mild LVH; trace AI; mildly dilated aortic root; mild MR; biatrial enlargement; mild to moderate TR.  09/01/2017 LHC and coronary angiography Conclusions: 1. No angiographically significant coronary artery disease. 2. Upper normal left ventricular filling pressure  Recommendations: 1. Continue medical therapy of diastolic heart failure and atrial fibrillation. 2. Gentle post-catheterization hydration given resolved acute kidney injury. 3. Restart heparin infusion 2 hours after TR band removal; rivaroxaban could be restarted as soon as tomorrow. Recommend to resume Rivaroxaban, at currently prescribed dose and frequency, on 09/01/17. Concurrent antiplatelet therapy not recommended.  Patient Profile     82 y.o. female with medical history significantfor paroxysmal A. Fib on anticoagulation(Xarelto), hypertension, hyperlipidemia, chronic renal insufficiency stage II, autoimmune hepatitis presents to the emergency department with the chief complaint of chest pain shortness of breath generalized weakness.  Assessment & Plan     1.  Respiratory failure: -In the setting of PNA with bilateral pleural effusions and consolidation per CXR>>>hold off on DCCV until respiratory status more stable  -Continue IV ABX, IS, UOOB as tolerated -Will continue with diuresis  -Per primary team/pulmonary  2.  Atrial fibrillation: -HR in the 80-100 range -Continue amiodarone 200 mg twice daily, metoprolol 50 mg twice daily -Continue Xarelto 15 mg daily -CHA2DS2VASc at least 4  3.  Acute on chronic presumed diastolic congestive heart failure: -Echocardiogram 08/10/2017 with LVEF of 50-55% and no significant CAD per cath on 09/01/2017 -BNP, 654>>down from 1314 on admission  -Weight, 85.1kg today>>>75.8kg on admission  -I&O, net negative 2.1L today  -Continue current regimen  4.  Acute renal insufficiency: -Creatinine, 1.30 today, improved from 1.35 yesterday -Lasix decreased to twice a day on 09/06/2017 -Continue current dose 40 mg IV twice daily -BMET in AM    Signed, Kathyrn Drown NP-C [place] Pager: [phone] 09/07/2017, 10:40 AM     For questions or updates, please contact   Please consult [email or web address] for contact info under Cardiology/STEMI.  Attending note:  See my note  from same day .    1.  1.  Respiratory failure: Discussed with pulmonary.  They plan on attempting a thoracentesis tomorrow.  Xarelto is currently on hold.  2.  Atrial fibrillation: Her A. fib rate is better controlled.    3.  Acute on chronic presumed diastolic congestive heart failure: Echocardiogram reveals normal left ventricular systolic function.  Significant CAD by cath.  Continue Lasix.  4.  Acute renal insufficiency: Creatinine seems to be stable.    Mertie Moores, MD  09/07/2017 5:59 PM    [address],  [place], [place]  [address] Pager [phone] Phone: [phone]; Fax: [phone]

## 2017-09-07 NOTE — Evaluation (Signed)
Physical Therapy Evaluation Patient Details Name: Brenda Matthews MRN: [id number] DOB: December 27, 1935 Today's Date: 09/07/2017   History of Present Illness  82 year old with history of atrial fibrillation, CHF, hypertension, hyperlipidemia, chronic kidney disease, autoimmune hepatitisAdmitted on 8/13 with chest pain, dyspnea, rapid atrial fibrillation, AKI, sepsis.  She was in the ICU for 1 day for hypotension requiring pressors briefly.  Treated for pneumonia with antibioticsAlso seen by cardiology for atrial fibrillation.  Cardiac catheterization on 8/16 showed no significant coronary artery disease and upper normal left ventricular filling pressure.PCCM was consulted on 8/19 for dyspnea, hypoxia and chest x-ray showing bilateral effusions.  Plan is for bil thoracentesis on 09/08/17.    Clinical Impression  Pt admitted with above diagnosis. Pt currently with functional limitations due to the deficits listed below (see PT Problem List). Pt was very fatigued and only able to stand for up to 20 seconds.  Pt with poor balance and need for min assist with posterior lean.  DOE 2/4 with standing on 3LO2.  Will follow acutely and progress pt as able and suspect that pt will need SNF at d/c with therapy prior to d/c home.   Pt will benefit from skilled PT to increase their independence and safety with mobility to allow discharge to the venue listed below.     Follow Up Recommendations SNF;Supervision/Assistance - 24 hour    Equipment Recommendations  Other (comment)(TBA at next venue)    Recommendations for Other Services       Precautions / Restrictions Precautions Precautions: Fall Restrictions Weight Bearing Restrictions: No      Mobility  Bed Mobility               General bed mobility comments: Pt in chair on arrival.   Transfers Overall transfer level: Needs assistance Equipment used: Rolling walker (2 wheeled) Transfers: Sit to/from Stand Sit to Stand: Min assist         General  transfer comment: Pt able to stand but needed to keep her LEs against chair for stability.  Pt did march in place but posterior LOB needing min assist.  Fatigued quickly and had to sit down in chair with assist to control descent.   Ambulation/Gait                Stairs            Wheelchair Mobility    Modified Rankin (Stroke Patients Only)       Balance Overall balance assessment: Needs assistance Sitting-balance support: No upper extremity supported;Feet supported Sitting balance-Leahy Scale: Fair     Standing balance support: Bilateral upper extremity supported;During functional activity Standing balance-Leahy Scale: Poor Standing balance comment: relies on UE support for balance.                              Pertinent Vitals/Pain Pain Assessment: No/denies pain   HR 100 -114 bpm,  94% on [place] expects to be discharged to:: Private residence Living Arrangements: Alone Available Help at Discharge: Friend(s);Available PRN/intermittently(3 neighbors) Type of Home: House Home Access: Stairs to enter Entrance Stairs-Rails: None Entrance Stairs-Number of Steps: 1 Home Layout: Two level;Able to live on main level with bedroom/bathroom Home Equipment: Gilford Rile - 2 wheels;Cane - single point(Apple watch) Additional Comments: Son lives in [place]    Prior Function Level of Independence: Needs assistance   Gait / Transfers Assistance Needed: Walked in home without device and is safe per  pt  ADL's / Homemaking Assistance Needed: showers independently, dressed independently        Hand Dominance   Dominant Hand: Right    Extremity/Trunk Assessment   Upper Extremity Assessment Upper Extremity Assessment: Defer to OT evaluation    Lower Extremity Assessment Lower Extremity Assessment: Generalized weakness    Cervical / Trunk Assessment Cervical / Trunk Assessment: Kyphotic  Communication   Communication: No  difficulties  Cognition Arousal/Alertness: Lethargic Behavior During Therapy: Flat affect Overall Cognitive Status: Within Functional Limits for tasks assessed                                        General Comments      Exercises General Exercises - Lower Extremity Ankle Circles/Pumps: AROM;Both;10 reps;Seated Long Arc Quad: AROM;Both;10 reps;Seated   Assessment/Plan    PT Assessment Patient needs continued PT services  PT Problem List Decreased mobility;Decreased activity tolerance;Decreased balance;Decreased knowledge of use of DME;Decreased safety awareness;Decreased knowledge of precautions;Cardiopulmonary status limiting activity       PT Treatment Interventions Gait training;DME instruction;Stair training;Functional mobility training;Therapeutic activities;Therapeutic exercise;Balance training;Patient/family education    PT Goals (Current goals can be found in the Care Plan section)  Acute Rehab PT Goals Patient Stated Goal: to go home PT Goal Formulation: With patient Time For Goal Achievement: 09/21/17 Potential to Achieve Goals: Good    Frequency Min 3X/week   Barriers to discharge Decreased caregiver support      Co-evaluation               AM-PAC PT "6 Clicks" Daily Activity  Outcome Measure Difficulty turning over in bed (including adjusting bedclothes, sheets and blankets)?: Unable Difficulty moving from lying on back to sitting on the side of the bed? : Unable Difficulty sitting down on and standing up from a chair with arms (e.g., wheelchair, bedside commode, etc,.)?: A Lot Help needed moving to and from a bed to chair (including a wheelchair)?: A Lot Help needed walking in hospital room?: A Lot Help needed climbing 3-5 steps with a railing? : A Lot 6 Click Score: 10    End of Session Equipment Utilized During Treatment: Gait belt Activity Tolerance: Patient limited by fatigue Patient left: in chair;with call bell/phone within  reach Nurse Communication: Mobility status PT Visit Diagnosis: Unsteadiness on feet (R26.81);History of falling (Z91.81);Muscle weakness (generalized) (M62.81)    Time: 2233-6122 PT Time Calculation (min) (ACUTE ONLY): 12 min   Charges:   PT Evaluation $PT Eval Moderate Complexity: [place] Taseen Marasigan,PT Acute Rehabilitation [phone] (pager)   Denice Paradise 09/07/2017, 11:25 AM

## 2017-09-07 NOTE — Progress Notes (Signed)
PROGRESS NOTE    Brenda Matthews  PXT:[id number] DOB: March 17, 1935 DOA: 08/29/2017 PCP: [name], MD    Brief Narrative: Brenda Matthews is a very pleasant 82 y.o. female with medical history significant for paroxysmal A. Fib on anticoagulation, hypertension, hyperlipidemia, chronic renal insufficiency stage II, autoimmune hepatitis presents to the emergency department with the chief complaint of chest pain shortness of breath generalized weakness. Initial evaluation reveals max temp of 100.6, EKG with atrial fibrillation and heart rate of 106, acute kidney injury.  Patient was transfered to ICU due to hypotension. She received IV bolus, started on neo/. Now off pressors. Diagnosed with PNA. Started on ceftriaxone. -underwent cardiac catheterization which showed nonobstructive disease - hospitalization complicated by recurrent respiratory distress distress due to pleural effusions, despite diuresis and antibiotics    Assessment & Plan:   1-Hypotension;  cardiogenic vs sepsis.  -status post fluid resuscitation, was on neo for one overnight.  -Chest x ray with PNA, started  IV antibiotics. Ceftriaxone day 8 now -Blood cultures. No growth.  -Pro-calcitonin and lactic acid normal.  -stop ceftriaxone and azithromycin  2-Acute Hypoxic Respiratory failure Multifactorial related to PNA, pleural effusion, pulmonary edema post fluid resuscitation.  -Cardiology consulted. Underwent cath; non obstructive  -8-17;Worsening hypoxemia and dyspnea. Repeated chest x ray stable.  CT chest with small pleural effusion, left LE consolidation. Seen by pulmonary, recommended IV lasix, incentive spirometry and complete course of antibiotics,  Needs repeat CT chest in 3-to 6 months. -8-18 improved dyspnea.  -8-19; increase work of breathing, worsening dyspnea. Repeated chest x ray worsening bilateral pleural effusion and atelectasis.  -after slight improvement yesterday early this morning continued to have  respiratory distress with minimal activity from getting out of bed, seen by pulmonary-plan for thoracentesis tomorrow after holding xarelto -ambulate, out of bed, PT OT  3-Pneumonia;  -see above  4-Rash; Dr Chase Caller consulted surgery. Vasculitis panel RF 22, vasculitis panel negative Rash improving, hold on biopsy.   4-Paroxysmal A fib;  Continue with amiodarone and xarelto.  Resumed metoprolol, now that BP has improved.  Cardiology following  5-HTN; hold losartan.   6-Hyperlipidemia; continue with statins.   7-Acute on chronic renal failure stage II; Avoid hypotension.  Renal US negative for hydronephrosis.  Stable.   8-History of NASH;   DVT prophylaxis:on xarelto-held for thoracentesis tomorrow  Code Status: Full code.  Family Communication: care discussed with patient.  Disposition Plan: remains in stepdown   Consultants:   Cardiology  CCM   Procedures:   Antimicrobials: Ceftriaxone 8-14  Subjective:  -still continues to feel tired and short of breath with exertion Objective: Vitals:   09/06/17 2342 09/07/17 0025 09/07/17 0424 09/07/17 0819  BP: (!) 130/91  (!) 146/85 133/88  Pulse: 90  (!) 104 (!) 118  Resp: (!) 32 (!) 25 (!) 35 (!) 38  Temp: (!) 97.4 F (36.3 C)  (!) 97.5 F (36.4 C) (!) 96.8 F (36 C)  TempSrc: Oral  Oral Axillary  SpO2: 96% 96% 95% 98%  Weight:   85.1 kg   Height:        Intake/Output Summary (Last 24 hours) at 09/07/2017 1315 Last data filed at 09/07/2017 0819 Gross per 24 hour  Intake -  Output 1150 ml  Net -1150 ml   Filed Weights   09/05/17 0500 09/06/17 0230 09/07/17 0424  Weight: 86.5 kg 83.9 kg 85.1 kg    Examination:  Gen: Awake, Alert, Oriented X 3, week/ill-appearing HEENT: PERRLA, Neck supple Lungs: few scattered  rales, decreased breath sounds at the bases CVS: RRR,No Gallops,Rubs or new Murmurs Abd: soft, Non tender, non distended, BS present Extremities: trace edema Skin: mild maculopapular rash  improving  Data Reviewed: I have personally reviewed following labs and imaging studies  CBC: Recent Labs  Lab 09/01/17 0054 09/02/17 0336 09/03/17 0253 09/04/17 0423 09/05/17 0338 09/06/17 0322 09/07/17 0322  WBC 7.6 6.3 6.4 6.6 7.3 7.5 8.4  NEUTROABS 5.8 4.7 4.9  --   --   --   --   HGB 12.3 11.6* 12.1 11.4* 11.9* 12.1 11.7*  HCT 38.7 37.4 39.5 36.8 38.3 38.5 39.0  MCV 91.9 93.5 94.7 93.9 93.6 93.0 95.1  PLT 154 152 194 227 244 307 010   Basic Metabolic Panel: Recent Labs  Lab 09/03/17 0253 09/04/17 0422 09/05/17 0338 09/06/17 0322 09/07/17 0322  NA 141 141 141 139 143  K 4.3 4.2 4.2 4.5 4.0  CL 111 111 107 108 108  CO2 20* 21* 23 20* 26  GLUCOSE 149* 105* 128* 99 123*  BUN 25* 26* 26* 29* 30*  CREATININE 1.35* 1.21* 1.25* 1.35* 1.30*  CALCIUM 8.7* 8.7* 8.8* 8.5* 8.7*   GFR: Estimated Creatinine Clearance: 34.9 mL/min (A) (by C-G formula based on SCr of 1.3 mg/dL (H)). Liver Function Tests: No results for input(s): AST, ALT, ALKPHOS, BILITOT, PROT, ALBUMIN in the last 168 hours. No results for input(s): LIPASE, AMYLASE in the last 168 hours. No results for input(s): AMMONIA in the last 168 hours. Coagulation Profile: No results for input(s): INR, PROTIME in the last 168 hours. Cardiac Enzymes: No results for input(s): CKTOTAL, CKMB, CKMBINDEX, TROPONINI in the last 168 hours. BNP (last 3 results) Recent Labs    08/01/17 1017 08/04/17 1447  PROBNP 631.0* 574.0*   HbA1C: No results for input(s): HGBA1C in the last 72 hours. CBG: No results for input(s): GLUCAP in the last 168 hours. Lipid Profile: No results for input(s): CHOL, HDL, LDLCALC, TRIG, CHOLHDL, LDLDIRECT in the last 72 hours. Thyroid Function Tests: No results for input(s): TSH, T4TOTAL, FREET4, T3FREE, THYROIDAB in the last 72 hours. Anemia Panel: No results for input(s): VITAMINB12, FOLATE, FERRITIN, TIBC, IRON, RETICCTPCT in the last 72 hours. Sepsis Labs: Recent Labs  Lab  09/01/17 0054 09/04/17 1859 09/05/17 0338 09/06/17 0933  PROCALCITON 0.61 0.13 <0.10 <0.10    Recent Results (from the past 240 hour(s))  Respiratory Panel by PCR     Status: None   Collection Time: 08/29/17  4:30 PM  Result Value Ref Range Status   Adenovirus NOT DETECTED NOT DETECTED Final   Coronavirus 229E NOT DETECTED NOT DETECTED Final   Coronavirus HKU1 NOT DETECTED NOT DETECTED Final   Coronavirus NL63 NOT DETECTED NOT DETECTED Final   Coronavirus OC43 NOT DETECTED NOT DETECTED Final   Metapneumovirus NOT DETECTED NOT DETECTED Final   Rhinovirus / Enterovirus NOT DETECTED NOT DETECTED Final   Influenza A NOT DETECTED NOT DETECTED Final   Influenza B NOT DETECTED NOT DETECTED Final   Parainfluenza Virus 1 NOT DETECTED NOT DETECTED Final   Parainfluenza Virus 2 NOT DETECTED NOT DETECTED Final   Parainfluenza Virus 3 NOT DETECTED NOT DETECTED Final   Parainfluenza Virus 4 NOT DETECTED NOT DETECTED Final   Respiratory Syncytial Virus NOT DETECTED NOT DETECTED Final   Bordetella pertussis NOT DETECTED NOT DETECTED Final   Chlamydophila pneumoniae NOT DETECTED NOT DETECTED Final   Mycoplasma pneumoniae NOT DETECTED NOT DETECTED Final    Comment: Performed at [facility],  [address], [place], [place] [address]  MRSA PCR Screening     Status: None   Collection Time: 08/29/17 11:29 PM  Result Value Ref Range Status   MRSA by PCR NEGATIVE NEGATIVE Final    Comment:        The GeneXpert MRSA Assay (FDA approved for NASAL specimens only), is one component of a comprehensive MRSA colonization surveillance program. It is not intended to diagnose MRSA infection nor to guide or monitor treatment for MRSA infections. Performed at [facility], [place] [address], [place], [place] [address]   Culture, blood (routine x 2)     Status: None   Collection Time: 08/30/17 12:25 AM  Result Value Ref Range Status   Specimen Description BLOOD LEFT HAND  Final   Special  Requests   Final    BOTTLES DRAWN AEROBIC AND ANAEROBIC Blood Culture adequate volume   Culture   Final    NO GROWTH 5 DAYS Performed at [facility], [place] [address] [place], [place] [address]    Report Status 09/04/2017 FINAL  Final  Culture, blood (routine x 2)     Status: None   Collection Time: 08/30/17 12:30 AM  Result Value Ref Range Status   Specimen Description BLOOD RIGHT ANTECUBITAL  Final   Special Requests   Final    BOTTLES DRAWN AEROBIC ONLY Blood Culture adequate volume   Culture   Final    NO GROWTH 5 DAYS Performed at [facility], [place] [address], [place], [place] [address]    Report Status 09/04/2017 FINAL  Final         Radiology Studies: Dg Chest 2 View  Result Date: 09/06/2017 CLINICAL DATA:  Acute respiratory failure. Shortness of breath and weakness. EXAM: CHEST - 2 VIEW COMPARISON:  09/04/2017 FINDINGS: Bilateral effusions persist with atelectasis in both lower lungs. No gross increase or decrease. Upper lungs remain well aerated. Chronically enlarged cardiac silhouette and aortic atherosclerosis. IMPRESSION: Bilateral effusions and dependent pulmonary atelectasis, similar to the appearance of 2 days ago. Electronically Signed   By: Nelson Chimes M.D.   On: 09/06/2017 08:04        Scheduled Meds: . amiodarone  200 mg Oral BID  . atorvastatin  40 mg Oral q1800  . azithromycin  500 mg Oral Daily  . fluticasone  2 spray Each Nare Daily  . furosemide  40 mg Intravenous Q12H  . guaiFENesin  1,200 mg Oral BID  . mouth rinse  15 mL Mouth Rinse BID  . metoprolol tartrate  50 mg Oral BID  . [START ON 09/08/2017] rivaroxaban  15 mg Oral Q supper  . sodium chloride flush  3 mL Intravenous Q12H   Continuous Infusions: . sodium chloride    . cefTRIAXone (ROCEPHIN)  IV 1 g (09/07/17 0932)     LOS: 8 days    Time spent: 35 minutes.     Domenic Polite, MD [facility]  Page via [email or web address] If 7PM-7AM, please contact  night-coverage [email or web address] Password [email or web address] 09/07/2017, 1:15 PM

## 2017-09-07 NOTE — Evaluation (Signed)
Occupational Therapy Evaluation Patient Details Name: Brenda Matthews MRN: [id number] DOB: [birth year] Today's Date: 09/07/2017    History of Present Illness 82 year old with history of atrial fibrillation, CHF, hypertension, hyperlipidemia, chronic kidney disease, autoimmune hepatitisAdmitted on 8/13 with chest pain, dyspnea, rapid atrial fibrillation, AKI, sepsis.  She was in the ICU for 1 day for hypotension requiring pressors briefly.  Treated for pneumonia with antibioticsAlso seen by cardiology for atrial fibrillation.  Cardiac catheterization on 8/16 showed no significant coronary artery disease and upper normal left ventricular filling pressure.PCCM was consulted on 8/19 for dyspnea, hypoxia and chest x-ray showing bilateral effusions.  Plan is for bil thoracentesis on 09/08/17.     Clinical Impression   This 82 yo female admitted with above presents to acute OT at a setup/S level to Mod A level for all basic ADLs and is normally Mod I to independent with all basic and IADLs. She is limited by her increased work of breathing with any activity and thus decreased endurance and safety with basic ADLs. She will benefit from acute OT with follow up OT at SNF to get back to PLOF.    Follow Up Recommendations  SNF;Supervision/Assistance - 24 hour    Equipment Recommendations  None recommended by OT       Precautions / Restrictions Precautions Precautions: Fall Restrictions Weight Bearing Restrictions: No      Mobility Bed Mobility Overal bed mobility: Needs Assistance Bed Mobility: Supine to Sit     Supine to sit: Min guard;HOB elevated     General bed mobility comments: use of rail  Transfers Overall transfer level: Needs assistance Equipment used: Rolling walker (2 wheeled) Transfers: Sit to/from Stand Sit to Stand: Mod assist         General transfer comment: VCs for hand placement    Balance Overall balance assessment: Needs assistance Sitting-balance support: No  upper extremity supported;Feet supported Sitting balance-Leahy Scale: Fair     Standing balance support: Bilateral upper extremity supported;During functional activity Standing balance-Leahy Scale: Poor Standing balance comment: relies on UE support for balance.                            ADL either performed or assessed with clinical judgement   ADL Overall ADL's : Needs assistance/impaired Eating/Feeding: Independent;Sitting   Grooming: Set up;Supervision/safety;Sitting;Oral care   Upper Body Bathing: Set up;Supervision/ safety;Sitting   Lower Body Bathing: Moderate assistance Lower Body Bathing Details (indicate cue type and reason): mod A sit<>stand from bed Upper Body Dressing : Set up;Supervision/safety;Sitting   Lower Body Dressing: Moderate assistance Lower Body Dressing Details (indicate cue type and reason): mod A sit<>stand from bed Toilet Transfer: Minimal assistance;RW Toilet Transfer Details (indicate cue type and reason): mod A sit<>stand from bed Toileting- Clothing Manipulation and Hygiene: Moderate assistance Toileting - Clothing Manipulation Details (indicate cue type and reason): mod A sit<>stand from bed             Vision Patient Visual Report: No change from baseline              Pertinent Vitals/Pain Pain Assessment: No/denies pain     Hand Dominance Right   Extremity/Trunk Assessment Upper Extremity Assessment Upper Extremity Assessment: Overall WFL for tasks assessed     Communication Communication Communication: No difficulties   Cognition Arousal/Alertness: Awake/alert Behavior During Therapy: (labile about situation (feeling worse today)) Overall Cognitive Status: Within Functional Limits for tasks assessed  Home Living Family/patient expects to be discharged to:: Private residence Living Arrangements: Alone Available Help at Discharge:  Friend(s);Available PRN/intermittently Type of Home: House Home Access: Stairs to enter [employer] of Steps: 1 Entrance Stairs-Rails: None Home Layout: Two level;Able to live on main level with bedroom/bathroom     Bathroom Shower/Tub: Walk-in shower   Bathroom Toilet: Handicapped height     Home Equipment: Environmental consultant - 2 wheels;Cane - single point(Apple watch)   Additional Comments: Son lives in [place] Functioning/Environment Level of Independence: Needs assistance  Gait / Transfers Assistance Needed: Walked in home without device and is safe per pt ADL's / Homemaking Assistance Needed: showers independently, dressed independently, cooked her own meals            OT Problem List: Decreased strength;Impaired balance (sitting and/or standing);Decreased activity tolerance;Cardiopulmonary status limiting activity      OT Treatment/Interventions: Self-care/ADL training;Balance training;DME and/or AE instruction;Patient/family education;Therapeutic activities;Energy conservation    OT Goals(Current goals can be found in the care plan section) Acute Rehab OT Goals Patient Stated Goal: to go home OT Goal Formulation: With patient Time For Goal Achievement: 09/21/17 Potential to Achieve Goals: Good  OT Frequency: Min 2X/week   Barriers to D/C: Decreased caregiver support             AM-PAC PT "6 Clicks" Daily Activity     Outcome Measure Help from another person eating meals?: None Help from another person taking care of personal grooming?: A Little Help from another person toileting, which includes using toliet, bedpan, or urinal?: A Lot Help from another person bathing (including washing, rinsing, drying)?: A Little Help from another person to put on and taking off regular upper body clothing?: A Lot Help from another person to put on and taking off regular lower body clothing?: A Lot 6 Click Score: 16   End of Session Equipment Utilized  During Treatment: Rolling walker;Gait belt Nurse Communication: Mobility status  Activity Tolerance: Patient limited by fatigue Patient left: in chair;with call bell/phone within reach;with chair alarm set  OT Visit Diagnosis: Unsteadiness on feet (R26.81);Muscle weakness (generalized) (M62.81)                Time: 1411-1440 OT Time Calculation (min): 29 min Charges:  OT General Charges $OT Visit: 1 Visit OT Evaluation $OT Eval Moderate Complexity: 1 Mod OT Treatments $Self Care/Home Management : 8-22 mins [place], OTR/L 014-1030 09/07/2017

## 2017-09-07 NOTE — Progress Notes (Signed)
PULMONARY / CRITICAL CARE MEDICINE   Name: Brenda Matthews MRN: [id number] DOB: 1935-03-19    ADMISSION DATE:  08/29/2017   CONSULTATION DATE: 09/04/17  REFERRING MD:   Rudolpho Sevin MD  CHIEF COMPLAINT: Bilateral pleural effusion  HISTORY OF PRESENT ILLNESS:   82 year old with history of atrial fibrillation, CHF, hypertension, hyperlipidemia, chronic kidney disease, autoimmune hepatitis Admitted on 8/13 with chest pain, dyspnea, rapid atrial fibrillation, AKI, sepsis.  She was in the ICU for 1 day for hypotension requiring pressors briefly.  Treated for pneumonia with antibiotics Also seen by cardiology for atrial fibrillation.  Cardiac catheterization on 8/16 showed no significant coronary artery disease and upper normal left ventricular filling pressure. PCCM was consulted on 8/19 for dyspnea, hypoxia and chest x-ray showing bilateral effusions.  SUBJECTIVE:  Continues to have difficulty breathing Short of breath getting out of bed today.  VITAL SIGNS: BP 133/88 (BP Location: Right Arm)   Pulse (!) 118   Temp (!) 96.8 F (36 C) (Axillary)   Resp (!) 38   Ht 5' 2"  (1.575 m)   Wt 85.1 kg   LMP  (LMP Unknown)   SpO2 98%   BMI 34.31 kg/m   HEMODYNAMICS:    VENTILATOR SETTINGS:    INTAKE / OUTPUT: I/O last 3 completed shifts: In: 240 [P.O.:240] Out: 1950 [Urine:1950]  PHYSICAL EXAMINATION: Gen:      No acute distress HEENT:  EOMI, sclera anicteric Neck:     No masses; no thyromegaly Lungs:   Diminished breath sounds at the base, rhonchi.  No wheeze CV:         Regular rate and rhythm; no murmurs Abd:      + bowel sounds; soft, non-tender; no palpable masses, no distension Ext:    1+ edema; adequate peripheral perfusion Skin:      Warm and dry; no rash Neuro: alert and oriented x 3 Psych: normal mood and affect  LABS:  BMET Recent Labs  Lab 09/05/17 0338 09/06/17 0322 09/07/17 0322  NA 141 139 143  K 4.2 4.5 4.0  CL 107 108 108  CO2 23 20* 26  BUN 26* 29*  30*  CREATININE 1.25* 1.35* 1.30*  GLUCOSE 128* 99 123*    Electrolytes Recent Labs  Lab 09/05/17 0338 09/06/17 0322 09/07/17 0322  CALCIUM 8.8* 8.5* 8.7*    CBC Recent Labs  Lab 09/05/17 0338 09/06/17 0322 09/07/17 0322  WBC 7.3 7.5 8.4  HGB 11.9* 12.1 11.7*  HCT 38.3 38.5 39.0  PLT 244 307 300    Coag's Recent Labs  Lab 08/31/17 1400 09/01/17 0054 09/01/17 2354  APTT 53* 101* 57*    Sepsis Markers Recent Labs  Lab 09/04/17 1859 09/05/17 0338 09/06/17 0933  PROCALCITON 0.13 <0.10 <0.10    ABG No results for input(s): PHART, PCO2ART, PO2ART in the last 168 hours.  Liver Enzymes No results for input(s): AST, ALT, ALKPHOS, BILITOT, ALBUMIN in the last 168 hours.  Cardiac Enzymes Recent Labs  Lab 08/31/17 1239  TROPONINI 0.11*    Glucose No results for input(s): GLUCAP in the last 168 hours.  Imaging No results found.Chest x-ray 8/21 reveals bilateral effusions and edema, reviewed by S. Minor   STUDIES:  CT chest 09/03/2017-bilateral effusion with atelectasis.  No obvious consolidation.  Cardiac enlargement with small pericardial effusion.  I have reviewed the images personally.  CULTURES: 8/13 RVP >> neg 8/14 MRSA PCR >> neg 8/14 BCx 2>> No Growth 8/14 Urine Streptococcus, Legionella >>  ANTIBIOTICS: ceftriaxone 8/13 >>  8/14 doxycycline >> 8/13 Azithromycin 8/19 >>  SIGNIFICANT EVENTS: 09/04/2017 PCCM recalled for worsening respiratory status  LINES/TUBES:   DISCUSSION: 83 year old with respiratory failure, pleural effusions in the setting of pneumonia, atrial fibrillation, CHF PCCM called back for intermittent periods of hypoxia, bilateral effusion.  Respiratory failure, bilateral effusion She has received adequate treatment for pneumonia.  Procalcitonin is negative Can stop antibiotics  Continues to have some difficulty breathing We are limited in diuresing her aggressively given her elevated creatinine Plan on thoracentesis  tomorrow after holding Xarelto. She has bilateral effusions, we can try the left side first since it appears to be bigger.  If she tolerates that then we can try the right side as well  Discussed with Dr. Cathie Olden, Dr. Broadus John.  Marshell Garfinkel MD [facility] 09/07/2017, 10:20 AM

## 2017-09-07 NOTE — Progress Notes (Signed)
PT Cancellation Note  Patient Details Name: Brenda Matthews MRN: [id number] DOB: [birth year]   Cancelled Treatment:    Reason Eval/Treat Not Completed: Fatigue/lethargy limiting ability to participate(Chart reviewed, attempted PT visit, pt inititally arouses to voice, but then immediate falls back into sleep while mumbling. Pt not awaking to additional verbal engagement. Will attempt again at later date/time when patient is better able to participate.) 10:18 AM, 09/07/17 Etta Grandchild, PT, DPT Physical Therapist - [place] [phone] (Pager)  [phone] (Office)      Buccola,Allan C 09/07/2017, 10:18 AM

## 2017-09-07 NOTE — NC FL2 (Signed)
[place] LEVEL OF CARE SCREENING TOOL     IDENTIFICATION  Patient Name: Brenda Matthews Birthdate: 11-Feb-1935 Sex: female Admission Date (Current Location): 08/29/2017  [facility] and [place] Number:  Herbalist and Address:  [facility], [place] [address] [place], [place] [address]      Provider Number: [id number]  Attending Physician Name and Address:  Domenic Polite, MD  Relative Name and Phone Number:  Tabita Corbo, son, [phone]    Current Level of Care: Hospital Recommended Level of Care: [place] Prior Approval Number:    Date Approved/Denied:   PASRR Number: [id number]  Discharge Plan: SNF    Current Diagnoses: Patient Active Problem List   Diagnosis Date Noted  . Acute respiratory failure ([place])   . Non-ST elevation (NSTEMI) myocardial infarction ([place])   . Sepsis ([place]) 08/30/2017  . Acute-on-chronic kidney injury ([place]) 08/30/2017  . Rash in adult 08/30/2017  . Atrial fibrillation and flutter ([place])   . Chest pain 08/29/2017  . DOE (dyspnea on exertion) 08/29/2017  . Arterial hypotension   . Chronic anticoagulation 12/19/2016  . AKI (acute kidney injury) ([place])   . Low TSH level 02/19/2016  . PAF (paroxysmal atrial fibrillation) ([place]) 02/18/2016  . Ankle fracture, left-s/p surgery 02/18/16 02/18/2016  . Closed displaced trimalleolar fracture of left ankle 02/18/2016  . Acute bronchitis 02/10/2015  . Bronchopneumonia 12/02/2013  . Chronic renal insufficiency, stage II (mild) 12/04/2012  . Hx of adenomatous colonic polyps   . Routine general medical examination at a health care facility 09/05/2011  . Cervical cancer screening 02/24/2011  . SCIATICA, BILATERAL 09/01/2009  . SACROILIAC STRAIN, ACUTE 09/01/2009  . ANEMIA 11/07/2007  . Osteopenia of the elderly 11/07/2007  . INSOMNIA 11/07/2007  . EDEMA 08/14/2007  . Hyperlipidemia 04/04/2007  . PANIC DISORDER 04/04/2007  . Essential hypertension  04/04/2007  . Anxiety state 12/27/2006  . ARTHRITIS 12/27/2006    Orientation RESPIRATION BLADDER Height & Weight     Self, Time, Place, Situation  O2(nasal cannula 3L) Continent, Indwelling catheter Weight: 187 lb 9.8 oz (85.1 kg) Height:  5' 2"  (157.5 cm)  BEHAVIORAL SYMPTOMS/MOOD NEUROLOGICAL BOWEL NUTRITION STATUS      Continent Diet(please see DC summary)  AMBULATORY STATUS COMMUNICATION OF NEEDS Skin   Extensive Assist Verbally Normal                       Personal Care Assistance Level of Assistance  Bathing, Feeding, Dressing Bathing Assistance: Limited assistance Feeding assistance: Independent Dressing Assistance: Limited assistance     Functional Limitations Info  Sight, Hearing, Speech Sight Info: Adequate Hearing Info: Adequate Speech Info: Adequate    SPECIAL CARE FACTORS FREQUENCY  PT (By licensed PT), OT (By licensed OT)     PT Frequency: 5x/week OT Frequency: 5x/week            Contractures Contractures Info: Not present    Additional Factors Info  Code Status, Allergies Code Status Info: Full Allergies Info: Augmentin Amoxicillin-pot Clavulanate           Current Medications (09/07/2017):  This is the current hospital active medication list Current Facility-Administered Medications  Medication Dose Route Frequency Provider Last Rate Last Dose  . 0.9 %  sodium chloride infusion  250 mL Intravenous PRN End, Harrell Gave, MD      . acetaminophen (TYLENOL) tablet 650 mg  650 mg Oral Q4H PRN End, Harrell Gave, MD   650 mg at 09/07/17  0930  . albuterol (PROVENTIL) (2.5 MG/3ML) 0.083% nebulizer solution 2.5 mg  2.5 mg Nebulization Q4H PRN End, Harrell Gave, MD   2.5 mg at 09/05/17 0155  . ALPRAZolam Duanne Moron) tablet 0.5 mg  0.5 mg Oral TID PRN Omar Person, NP   0.5 mg at 09/07/17 0327  . amiodarone (PACERONE) tablet 200 mg  200 mg Oral BID End, Harrell Gave, MD   200 mg at 09/07/17 0925  . atorvastatin (LIPITOR) tablet 40 mg  40 mg Oral  q1800 End, Christopher, MD   40 mg at 09/06/17 1747  . benzonatate (TESSALON) capsule 100 mg  100 mg Oral TID PRN End, Harrell Gave, MD   100 mg at 09/05/17 2142  . fluticasone (FLONASE) 50 MCG/ACT nasal spray 2 spray  2 spray Each Nare Daily End, Christopher, MD   2 spray at 09/07/17 0927  . furosemide (LASIX) injection 40 mg  40 mg Intravenous Q12H Nahser, Wonda Cheng, MD   40 mg at 09/07/17 0558  . gi cocktail (Maalox,Lidocaine,Donnatal)  30 mL Oral QID PRN End, Harrell Gave, MD   30 mL at 08/29/17 1554  . guaiFENesin (MUCINEX) 12 hr tablet 1,200 mg  1,200 mg Oral BID Magdalen Spatz, NP   1,200 mg at 09/07/17 9924  . guaiFENesin-dextromethorphan (ROBITUSSIN DM) 100-10 MG/5ML syrup 5 mL  5 mL Oral Q4H PRN Regalado, Belkys A, MD   5 mL at 09/06/17 1956  . ipratropium-albuterol (DUONEB) 0.5-2.5 (3) MG/3ML nebulizer solution 3 mL  3 mL Nebulization Q4H PRN Domenic Polite, MD      . MEDLINE mouth rinse  15 mL Mouth Rinse BID End, Harrell Gave, MD   15 mL at 09/07/17 0931  . metoprolol tartrate (LOPRESSOR) tablet 50 mg  50 mg Oral BID Herminio Commons, MD   50 mg at 09/07/17 0925  . ondansetron (ZOFRAN) injection 4 mg  4 mg Intravenous Q6H PRN End, Christopher, MD      . sodium chloride flush (NS) 0.9 % injection 3 mL  3 mL Intravenous Q12H End, Christopher, MD   3 mL at 09/07/17 0927  . sodium chloride flush (NS) 0.9 % injection 3 mL  3 mL Intravenous PRN End, Harrell Gave, MD         Discharge Medications: Please see discharge summary for a list of discharge medications.  Relevant Imaging Results:  Relevant Lab Results:   Additional Information SSN: [id number]  Estanislado Emms, LCSW

## 2017-09-07 NOTE — Progress Notes (Signed)
Progress Note  Patient Name: Brenda Matthews Date of Encounter: 09/07/2017  Primary Cardiologist: [name], MD   Subjective   Patient reports she did not sleep well again d/t frequent urination 2/2 ongoing lasix diuresis last night. She reports she feels weak. Earlier today she attempted to "tidy up around the room" and felt very SOB and weak while ambulated so had to get back into bed.   I/O are net - 2.1 liters so far this admission    Inpatient Medications    Scheduled Meds: . amiodarone  200 mg Oral BID  . atorvastatin  40 mg Oral q1800  . fluticasone  2 spray Each Nare Daily  . furosemide  40 mg Intravenous Q12H  . guaiFENesin  1,200 mg Oral BID  . mouth rinse  15 mL Mouth Rinse BID  . metoprolol tartrate  50 mg Oral BID  . sodium chloride flush  3 mL Intravenous Q12H   Continuous Infusions: . sodium chloride     PRN Meds: sodium chloride, acetaminophen, albuterol, ALPRAZolam, benzonatate, gi cocktail, guaiFENesin-dextromethorphan, ipratropium-albuterol, ondansetron (ZOFRAN) IV, sodium chloride flush   Vital Signs    Vitals:   09/06/17 2342 09/07/17 0025 09/07/17 0424 09/07/17 0819  BP: (!) 130/91  (!) 146/85 133/88  Pulse: 90  (!) 104 (!) 118  Resp: (!) 32 (!) 25 (!) 35 (!) 38  Temp: (!) 97.4 F (36.3 C)  (!) 97.5 F (36.4 C) (!) 96.8 F (36 C)  TempSrc: Oral  Oral Axillary  SpO2: 96% 96% 95% 98%  Weight:   85.1 kg   Height:        Intake/Output Summary (Last 24 hours) at 09/07/2017 1338 Last data filed at 09/07/2017 0819 Gross per 24 hour  Intake -  Output 1150 ml  Net -1150 ml   Filed Weights   09/05/17 0500 09/06/17 0230 09/07/17 0424  Weight: 86.5 kg 83.9 kg 85.1 kg    Telemetry    IRIR with HR 80s to low 100s- Personally Reviewed  ECG    N/A- Personally Reviewed  Physical Exam   Physical Exam: Blood pressure 133/88, pulse (!) 118, temperature (!) 96.8 F (36 C), temperature source Axillary, resp. rate (!) 38, height 5' 2"   (1.575 m), weight 85.1 kg, SpO2 98 %.  GEN:   Chronically ill-appearing elderly female, mild to moderate respiratory distress.  She seems to be worn out. HEENT: Normal NECK: No JVD; No carotid bruits LYMPHATICS: No lymphadenopathy CARDIAC: Irregularly irregular.  Heart rate is fairly well-controlled. RESPIRATORY: Reduced breath sounds bilaterally.  She has consolidation in her left lower lung field. ABDOMEN: Soft, non-tender, non-distended MUSCULOSKELETAL:  No edema; No deformity  SKIN: Warm and dry NEUROLOGIC:  Alert and oriented x 3   Labs    Chemistry Recent Labs  Lab 09/05/17 0338 09/06/17 0322 09/07/17 0322  NA 141 139 143  K 4.2 4.5 4.0  CL 107 108 108  CO2 23 20* 26  GLUCOSE 128* 99 123*  BUN 26* 29* 30*  CREATININE 1.25* 1.35* 1.30*  CALCIUM 8.8* 8.5* 8.7*  GFRNONAA 40* 36* 38*  GFRAA 46* 42* 44*  ANIONGAP 11 11 9      Hematology Recent Labs  Lab 09/05/17 0338 09/06/17 0322 09/07/17 0322  WBC 7.3 7.5 8.4  RBC 4.09 4.14 4.10  HGB 11.9* 12.1 11.7*  HCT 38.3 38.5 39.0  MCV 93.6 93.0 95.1  MCH 29.1 29.2 28.5  MCHC 31.1 31.4 30.0  RDW 15.0 14.7 14.6  PLT 244  307 300    Cardiac Enzymes No results for input(s): TROPONINI in the last 168 hours. No results for input(s): TROPIPOC in the last 168 hours.   BNP Recent Labs  Lab 09/02/17 0336 09/06/17 0322  BNP 1,314.7* 654.9*     DDimer No results for input(s): DDIMER in the last 168 hours.   Radiology    Dg Chest 2 View  Result Date: 09/06/2017 CLINICAL DATA:  Acute respiratory failure. Shortness of breath and weakness. EXAM: CHEST - 2 VIEW COMPARISON:  09/04/2017 FINDINGS: Bilateral effusions persist with atelectasis in both lower lungs. No gross increase or decrease. Upper lungs remain well aerated. Chronically enlarged cardiac silhouette and aortic atherosclerosis. IMPRESSION: Bilateral effusions and dependent pulmonary atelectasis, similar to the appearance of 2 days ago. Electronically Signed   By:  Nelson Chimes M.D.   On: 09/06/2017 08:04    Cardiac Studies   Echocardiogram 08/10/2017: Study Conclusions - Left ventricle: The cavity size was normal. Wall thickness was increased in a pattern of mild LVH. Systolic function was normal. The estimated ejection fraction was in the range of 50% to 55%. Wall motion was normal; there were no regional wall motion abnormalities. - Aortic valve: There was trivial regurgitation. - Aortic root: The aortic root was mildly dilated. - Mitral valve: There was mild regurgitation. - Left atrium: The atrium was severely dilated. - Right atrium: The atrium was mildly dilated. - Tricuspid valve: There was mild-moderate regurgitation. - Pulmonary arteries: PA peak pressure: 32 mm Hg (S). - Pericardium, extracardiac: A trivial pericardial effusion was identified. Impressions: - Normal LV systolic function; mild LVH; trace AI; mildly dilated aortic root; mild MR; biatrial enlargement; mild to moderate TR.  09/01/2017 LHC and coronary angiography Conclusions: 1. No angiographically significant coronary artery disease. 2. Upper normal left ventricular filling pressure  Recommendations: 1. Continue medical therapy of diastolic heart failure and atrial fibrillation. 2. Gentle post-catheterization hydration given resolved acute kidney injury. 3. Restart heparin infusion 2 hours after TR band removal; rivaroxaban could be restarted as soon as tomorrow. Recommend to resume Rivaroxaban, at currently prescribed dose and frequency, on 09/01/17. Concurrent antiplatelet therapy not recommended.  Patient Profile     82 y.o. female with medical history significantfor paroxysmal A. Fib on anticoagulation (Xarelto), hypertension, hyperlipidemia, chronic renal insufficiency stage II, autoimmune hepatitis presents to the emergency department with the chief complaint of chest pain shortness of breath generalized weakness.  Assessment & Plan      1.   1.  Respiratory failure: Discussed with pulmonary.  They plan on attempting a thoracentesis tomorrow.  Xarelto is currently on hold.  2.  Atrial fibrillation: Her A. fib rate is better controlled.    3.  Acute on chronic presumed diastolic congestive heart failure: Echocardiogram reveals normal left ventricular systolic function.  Significant CAD by cath.  Continue Lasix.  4.  Acute renal insufficiency: Creatinine seems to be stable.    I have spent a total of 40 minutes with patient reviewing hospital  notes , telemetry, EKGs, labs and examining patient as well as establishing an assessment and plan that was discussed with the patient. > 50% of time was spent in direct patient care.    Thayer Headings, Brooke Bonito., MD, [facility] 09/07/2017, 1:38 PM [address],  [place] Pager [phone]

## 2017-09-07 NOTE — Progress Notes (Addendum)
Notified practitioner of restlessness and post void residual of 685m. Orders received for foley.

## 2017-09-08 ENCOUNTER — Inpatient Hospital Stay (HOSPITAL_COMMUNITY): Payer: Medicare Other

## 2017-09-08 DIAGNOSIS — Z9889 Other specified postprocedural states: Secondary | ICD-10-CM

## 2017-09-08 DIAGNOSIS — J90 Pleural effusion, not elsewhere classified: Secondary | ICD-10-CM

## 2017-09-08 LAB — CBC
HCT: 38.8 % (ref 36.0–46.0)
Hemoglobin: 11.7 g/dL — ABNORMAL LOW (ref 12.0–15.0)
MCH: 28.7 pg (ref 26.0–34.0)
MCHC: 30.2 g/dL (ref 30.0–36.0)
MCV: 95.3 fL (ref 78.0–100.0)
PLATELETS: 285 10*3/uL (ref 150–400)
RBC: 4.07 MIL/uL (ref 3.87–5.11)
RDW: 14.5 % (ref 11.5–15.5)
WBC: 7.9 10*3/uL (ref 4.0–10.5)

## 2017-09-08 LAB — BASIC METABOLIC PANEL
Anion gap: 6 (ref 5–15)
BUN: 29 mg/dL — AB (ref 8–23)
CO2: 30 mmol/L (ref 22–32)
CREATININE: 1.21 mg/dL — AB (ref 0.44–1.00)
Calcium: 9 mg/dL (ref 8.9–10.3)
Chloride: 105 mmol/L (ref 98–111)
GFR calc Af Amer: 48 mL/min — ABNORMAL LOW (ref >60)
GFR, EST NON AFRICAN AMERICAN: 41 mL/min — AB (ref >60)
Glucose, Bld: 124 mg/dL — ABNORMAL HIGH (ref 70–99)
Potassium: 3.9 mmol/L (ref 3.5–5.1)
SODIUM: 141 mmol/L (ref 135–145)

## 2017-09-08 LAB — BODY FLUID CELL COUNT WITH DIFFERENTIAL
EOS FL: 0 %
LYMPHS FL: 52 %
Lymphs, Fluid: 46 %
MONOCYTE-MACROPHAGE-SEROUS FLUID: 11 % — AB (ref 50–90)
MONOCYTE-MACROPHAGE-SEROUS FLUID: 5 % — AB (ref 50–90)
NEUTROPHIL FLUID: 43 % — AB (ref 0–25)
Neutrophil Count, Fluid: 43 % — ABNORMAL HIGH (ref 0–25)
OTHER CELLS FL: 0 %
Total Nucleated Cell Count, Fluid: 403 cu mm (ref 0–1000)
WBC FLUID: 113 uL (ref 0–1000)

## 2017-09-08 LAB — LACTATE DEHYDROGENASE: LDH: 256 U/L — ABNORMAL HIGH (ref 98–192)

## 2017-09-08 LAB — GRAM STAIN: Gram Stain: NONE SEEN

## 2017-09-08 LAB — PROTEIN, PLEURAL OR PERITONEAL FLUID: Total protein, fluid: <3 g/dL

## 2017-09-08 LAB — LACTATE DEHYDROGENASE, PLEURAL OR PERITONEAL FLUID
LD FL: 202 U/L — AB (ref 3–23)
LD, Fluid: 173 U/L — ABNORMAL HIGH (ref 3–23)

## 2017-09-08 LAB — PROTEIN, TOTAL: Total Protein: 7 g/dL (ref 6.5–8.1)

## 2017-09-08 LAB — CHOLESTEROL, TOTAL: Cholesterol: 100 mg/dL (ref 0–200)

## 2017-09-08 NOTE — Progress Notes (Addendum)
PULMONARY / CRITICAL CARE MEDICINE   Name: Brenda Matthews MRN: [id number] DOB: 16-Jun-1935    ADMISSION DATE:  08/29/2017   CONSULTATION DATE: 09/04/17  REFERRING MD:   Rudolpho Sevin MD  CHIEF COMPLAINT: Bilateral pleural effusion  HISTORY OF PRESENT ILLNESS:   82 year old with history of atrial fibrillation, CHF, hypertension, hyperlipidemia, chronic kidney disease, autoimmune hepatitis Admitted on 8/13 with chest pain, dyspnea, rapid atrial fibrillation, AKI, sepsis.  She was in the ICU for 1 day for hypotension requiring pressors briefly.  Treated for pneumonia with antibiotics Also seen by cardiology for atrial fibrillation.  Cardiac catheterization on 8/16 showed no significant coronary artery disease and upper normal left ventricular filling pressure. PCCM was consulted on 8/19 for dyspnea, hypoxia and chest x-ray showing bilateral effusions.  SUBJECTIVE:  S/P B/L thoracentesis today.   VITAL SIGNS: BP (!) 142/99 (BP Location: Right Arm)   Pulse 97   Temp 98 F (36.7 C) (Oral)   Resp (!) 28   Ht 5' 2"  (1.575 m)   Wt 82.4 kg   LMP  (LMP Unknown)   SpO2 95%   BMI 33.23 kg/m   HEMODYNAMICS:    VENTILATOR SETTINGS:    INTAKE / OUTPUT: I/O last 3 completed shifts: In: -  Out: 2150 [Urine:2150]  PHYSICAL EXAMINATION: Gen:      No acute distress HEENT:  EOMI, sclera anicteric Neck:     No masses; no thyromegaly Lungs:    Bilateral rhonchi, no wheeze. CV:         Regular rate and rhythm; no murmurs Abd:      + bowel sounds; soft, non-tender; no palpable masses, no distension Ext:    2+ edema; adequate peripheral perfusion Skin:      Warm and dry; no rash Neuro: alert and oriented x 3 Psych: normal mood and affect  LABS:  BMET Recent Labs  Lab 09/06/17 0322 09/07/17 0322 09/08/17 0328  NA 139 143 141  K 4.5 4.0 3.9  CL 108 108 105  CO2 20* 26 30  BUN 29* 30* 29*  CREATININE 1.35* 1.30* 1.21*  GLUCOSE 99 123* 124*    Electrolytes Recent Labs  Lab  09/06/17 0322 09/07/17 0322 09/08/17 0328  CALCIUM 8.5* 8.7* 9.0    CBC Recent Labs  Lab 09/06/17 0322 09/07/17 0322 09/08/17 0328  WBC 7.5 8.4 7.9  HGB 12.1 11.7* 11.7*  HCT 38.5 39.0 38.8  PLT 307 300 285    Coag's Recent Labs  Lab 09/01/17 2354  APTT 57*    Sepsis Markers Recent Labs  Lab 09/04/17 1859 09/05/17 0338 09/06/17 0933  PROCALCITON 0.13 <0.10 <0.10    ABG No results for input(s): PHART, PCO2ART, PO2ART in the last 168 hours.  Liver Enzymes No results for input(s): AST, ALT, ALKPHOS, BILITOT, ALBUMIN in the last 168 hours.  Cardiac Enzymes No results for input(s): TROPONINI, PROBNP in the last 168 hours.  Glucose No results for input(s): GLUCAP in the last 168 hours.  Imaging Dg Chest Port 1 View  Result Date: 09/08/2017 CLINICAL DATA:  Status post left thoracentesis EXAM: PORTABLE CHEST 1 VIEW COMPARISON:  09/08/2017 FINDINGS: Cardiac shadow remains enlarged. Aortic calcifications are again seen. Significant reduction in left-sided pleural effusion is noted following thoracentesis. No pneumothorax is noted. Right lung remains well aerated following previous right thoracentesis. No bony abnormality is noted. IMPRESSION: No pneumothorax following left-sided thoracentesis. Electronically Signed   By: Inez Catalina M.D.   On: 09/08/2017 14:16   Dg Chest [facility]  1 View  Result Date: 09/08/2017 CLINICAL DATA:  Shortness of breath.  Status post thoracentesis EXAM: PORTABLE CHEST 1 VIEW COMPARISON:  September 06, 2017 FINDINGS: No evident pneumothorax. There is left lower lobe consolidation with left pleural effusion. There has been resolution of pleural effusion on the right. The right lung is clear. There is stable cardiomegaly with pulmonary vascularity normal. No adenopathy. There is aortic atherosclerosis. No bone lesions. IMPRESSION: Resolution of right pleural effusion. No pneumothorax. There is a left pleural effusion with left lower lobe consolidation,  increased from 2 days prior. Stable cardiomegaly. There is aortic atherosclerosis. No evident adenopathy. Aortic Atherosclerosis (ICD10-I70.0). Electronically Signed   By: Lowella Grip III M.D.   On: 09/08/2017 13:02  Chest x-ray 8/21 reveals bilateral effusions and edema, reviewed by S. Minor   STUDIES:  CT chest 09/03/2017-bilateral effusion with atelectasis.  No obvious consolidation.  Cardiac enlargement with small pericardial effusion.  I have reviewed the images personally.  CULTURES: 8/13 RVP >> neg 8/14 MRSA PCR >> neg 8/14 BCx 2>> No Growth 8/14 Urine Streptococcus, Legionella >>  ANTIBIOTICS: ceftriaxone 8/13 >> 8/22 8/14 doxycycline >> 8/13 Azithromycin 8/19 >> 8/22  SIGNIFICANT EVENTS: 09/04/2017 PCCM recalled for worsening respiratory status 8/23 > B/L Thoracentesis  LINES/TUBES:  DISCUSSION: 82 year old with respiratory failure, pleural effusions in the setting of pneumonia, atrial fibrillation, CHF PCCM called back for intermittent periods of hypoxia, bilateral effusion.  Respiratory failure, bilateral effusion She has received adequate treatment for pneumonia.  Procalcitonin is negative Continue diuresis as allowed by renal function S/p B/L thoracentesis. Follow pleural studies.   We will check back on 03/20/2022. Please call with any questions over the weekend.   Marshell Garfinkel MD [place] [facility] 09/08/2017, 3:36 PM

## 2017-09-08 NOTE — Procedures (Addendum)
Thoracentesis Procedure Note  Pre-operative Diagnosis: Pleural Effusions secondary to CHF  Post-operative Diagnosis: same  Indications: pleural effusion   Procedure Details  Consent: Informed consent was obtained. Risks of the procedure were discussed including: infection, bleeding, pain, pneumothorax.  Under sterile conditions the patient was positioned. Betadine solution and sterile drapes were utilized.  1% plain lidocaine was used to anesthetize the space identified by real time [place]. 600cc fluid was obtained without any difficulties and minimal blood loss.  A dressing was applied to the wound and wound care instructions were provided.       Findings 600 ml of clear pleural fluid was obtained. A sample was sent to Pathology for cytogenetics, flow, and cell counts, as well as for infection analysis.  Complications:  None; patient tolerated the procedure well.          Condition: stable  Seawell, Jaimie A, DO 09/08/2017, 11:50 AM Pager: [phone]   I was present and scrubbed in for entire procedure  Erick Colace ACNP-BC [place] Pager # [phone] OR # [phone] if no answer

## 2017-09-08 NOTE — Progress Notes (Addendum)
Progress Note  Patient Name: Brenda Matthews Date of Encounter: 09/08/2017  Primary Cardiologist: [name], MD   Subjective   Pt is frustrated with not feeling well. Awaiting upcoming thoracentesis planned for today. Denies chest pain or palpitations. Remains in AF with elevated rates.    Inpatient Medications    Scheduled Meds: . amiodarone  200 mg Oral BID  . atorvastatin  40 mg Oral q1800  . fluticasone  2 spray Each Nare Daily  . furosemide  40 mg Intravenous Q12H  . guaiFENesin  1,200 mg Oral BID  . mouth rinse  15 mL Mouth Rinse BID  . metoprolol tartrate  50 mg Oral BID  . sodium chloride flush  3 mL Intravenous Q12H   Continuous Infusions: . sodium chloride     PRN Meds: sodium chloride, acetaminophen, albuterol, ALPRAZolam, benzonatate, gi cocktail, guaiFENesin-dextromethorphan, ipratropium-albuterol, ondansetron (ZOFRAN) IV, sodium chloride flush   Vital Signs    Vitals:   09/08/17 0331 09/08/17 0431 09/08/17 0525 09/08/17 0756  BP: (!) 128/102 120/88  107/76  Pulse: 88   (!) 115  Resp: (!) 24  (!) 33 (!) 28  Temp: 98 F (36.7 C)   98.5 F (36.9 C)  TempSrc: Oral   Oral  SpO2: 98%   100%  Weight:   82.4 kg   Height:        Intake/Output Summary (Last 24 hours) at 09/08/2017 0952 Last data filed at 09/08/2017 0534 Gross per 24 hour  Intake -  Output 1000 ml  Net -1000 ml   Filed Weights   09/06/17 0230 09/07/17 0424 09/08/17 0525  Weight: 83.9 kg 85.1 kg 82.4 kg   Physical Exam   General: Obese, NAD Skin: Warm, dry, intact  Head: Normocephalic, atraumatic,clear, moist mucus membranes. Neck: Negative for carotid bruits. No JVD Lungs: Diminished bilaterally.  No wheezes, rales, or rhonchi. Breathing is unlabored. Cardiovascular: Irregularly irregular with S1 S2. No murmurs, rubs, gallops, or LV heave appreciated. Abdomen: Soft, non-tender, non-distended with normoactive bowel sounds. No obvious abdominal masses. MSK: Strength and tone  appear normal for age. 5/5 in all extremities Extremities: 2+ BLE edema. No clubbing or cyanosis. DP/PT pulses 1+ bilaterally Neuro: Alert and oriented. No focal deficits. No facial asymmetry. MAE spontaneously. Psych: Responds to questions appropriately with normal affect.    Labs    Chemistry Recent Labs  Lab 09/06/17 0322 09/07/17 0322 09/08/17 0328  NA 139 143 141  K 4.5 4.0 3.9  CL 108 108 105  CO2 20* 26 30  GLUCOSE 99 123* 124*  BUN 29* 30* 29*  CREATININE 1.35* 1.30* 1.21*  CALCIUM 8.5* 8.7* 9.0  GFRNONAA 36* 38* 41*  GFRAA 42* 44* 48*  ANIONGAP 11 9 6      Hematology Recent Labs  Lab 09/06/17 0322 09/07/17 0322 09/08/17 0328  WBC 7.5 8.4 7.9  RBC 4.14 4.10 4.07  HGB 12.1 11.7* 11.7*  HCT 38.5 39.0 38.8  MCV 93.0 95.1 95.3  MCH 29.2 28.5 28.7  MCHC 31.4 30.0 30.2  RDW 14.7 14.6 14.5  PLT 307 300 285   Cardiac EnzymesNo results for input(s): TROPONINI in the last 168 hours. No results for input(s): TROPIPOC in the last 168 hours.   BNP Recent Labs  Lab 09/02/17 0336 09/06/17 0322  BNP 1,314.7* 654.9*     DDimer No results for input(s): DDIMER in the last 168 hours.   Radiology    No results found.  Telemetry    AF  with HR 80-100's - Personally Reviewed  ECG    No new tracing as of 09/08/17 - Personally Reviewed  Cardiac Studies   Echocardiogram 08/10/2017: Study Conclusions - Left ventricle: The cavity size was normal. Wall thickness was increased in a pattern of mild LVH. Systolic function was normal. The estimated ejection fraction was in the range of 50% to 55%. Wall motion was normal; there were no regional wall motion abnormalities. - Aortic valve: There was trivial regurgitation. - Aortic root: The aortic root was mildly dilated. - Mitral valve: There was mild regurgitation. - Left atrium: The atrium was severely dilated. - Right atrium: The atrium was mildly dilated. - Tricuspid valve: There was mild-moderate  regurgitation. - Pulmonary arteries: PA peak pressure: 32 mm Hg (S). - Pericardium, extracardiac: A trivial pericardial effusion was identified. Impressions: - Normal LV systolic function; mild LVH; trace AI; mildly dilated aortic root; mild MR; biatrial enlargement; mild to moderate TR.  09/01/2017 LHC and coronary angiography Conclusions: 1. No angiographically significant coronary artery disease. 2. Upper normal left ventricular filling pressure  Recommendations: 1. Continue medical therapy of diastolic heart failure and atrial fibrillation. 2. Gentle post-catheterization hydration given resolved acute kidney injury. 3. Restart heparin infusion 2 hours after TR band removal; rivaroxaban could be restarted as soon as tomorrow. Recommend to resume Rivaroxaban, at currently prescribed dose and frequency, on 09/01/17. Concurrent antiplatelet therapy not recommended.  Patient Profile     82 y.o. female with medical history significantfor paroxysmal A. Fib on anticoagulation(Xarelto), hypertension, hyperlipidemia, chronic renal insufficiency stage II, autoimmune hepatitis presents to the emergency department with the chief complaint of chest pain shortness of breath generalized weakness.  Assessment & Plan    1.  Respiratory failure: -In the setting of PNA with bilateral pleural effusions and consolidation per CXR>>>hold off on DCCV until respiratory status more stable  -Continue IV ABX, IS, UOOB as tolerated -Will continue with diuresis  -Per primary team/pulmonary>>>plan for thoracentesis today 09/08/17 -Xarelto on hold>>will resume once ok with pulmonary post proceudure   2.  Atrial fibrillation: -HR in the 80-100 range -Continue amiodarone 200 mg twice daily, metoprolol 50 mg twice daily -Continue Xarelto 15 mg daily>>>currently on hold secondary to procedure today, see plan above  -CHA2DS2VASc at least 4  3.  Acute on chronic presumed diastolic congestive heart  failure: -Echocardiogram 08/10/2017 with LVEF of 50-55% and no significant CAD per cath on 09/01/2017 -BNP, 654>>down from 1314 on admission  -Weight, 82.4kg today>>>75.8kg on admission  -I&O, net negative 3.1L today  -Continue current regimen  4.  Acute renal insufficiency: -Creatinine, 1.21 today, improved from 1.30 yesterday -Lasix decreased to twice a day on 09/06/2017 -Continue current dose 40 mg IV twice daily -BMET in AM   Signed, Kathyrn Drown NP-C [place] Pager: [phone] 09/08/2017, 9:52 AM     For questions or updates, please contact   Please consult [email or web address] for contact info under Cardiology/STEMI.  Attending Note:   The patient was seen and examined.  Agree with assessment and plan as noted above.  Changes made to the above note as needed.  Patient seen and independently examined with  Kathyrn Drown. NP .   We discussed all aspects of the encounter. I agree with the assessment and plan as stated above.  1.   Respiratory failure: Patient is status post thoracentesis of 600 cc. Very shallow breathing.  Chest x-ray has been ordered.  Her oxygenation is normal.  2.  Atrial fibrillation: Patient continues to have atrial for ablation.  Her heart rate is better with a rate of between 90 and 100.   I have spent a total of 40 minutes with patient reviewing hospital  notes , telemetry, EKGs, labs and examining patient as well as establishing an assessment and plan that was discussed with the patient. > 50% of time was spent in direct patient care.    [place] Headings, Brooke Bonito., MD, [facility] 09/08/2017, 12:05 PM [address]  [place] Pager [phone]

## 2017-09-08 NOTE — Care Management Important Message (Signed)
Important Message  Patient Details  Name: Brenda Matthews MRN: [id number] Date of Birth: 05/25/1935   Medicare Important Message Given:  Yes    Shelvy Perazzo P Murry Diaz 09/08/2017, 2:05 PM

## 2017-09-08 NOTE — Progress Notes (Signed)
PROGRESS NOTE    Brenda Matthews  WUJ:[id number] DOB: [birth year] DOA: 08/29/2017 PCP: [name], MD    Brief Narrative: Brenda Matthews is a very pleasant 82 y.o. female with medical history significant for paroxysmal A. Fib on anticoagulation, hypertension, hyperlipidemia, chronic renal insufficiency stage II, autoimmune hepatitis presents to the emergency department with the chief complaint of chest pain shortness of breath generalized weakness. Initial evaluation reveals max temp of 100.6, EKG with atrial fibrillation and heart rate of 106, acute kidney injury.  Patient was transfered to ICU due to hypotension. She received IV bolus, started on neo/. Now off pressors. Diagnosed with PNA. Started on ceftriaxone. -underwent cardiac catheterization which showed nonobstructive disease - hospitalization complicated by recurrent respiratory distress distress due to pleural effusions, despite diuresis and antibiotics    Assessment & Plan:   1-Hypotension;  cardiogenic vs sepsis.  -status post fluid resuscitation, was on neo for one overnight.  -Chest x ray with PNA,  Treated with IV ceftriaxone and azithromycin for 8 days -Blood cultures. No growth.  -Pro-calcitonin and lactic acid normal.  -antibiotics discontinued 8/22  2-Acute Hypoxic Respiratory failure Multifactorial related to PNA, pleural effusion, pulmonary edema post fluid resuscitation.  -Cardiology consulted. Underwent cath; non obstructive  -8-17;Worsening hypoxemia and dyspnea CT chest with small pleural effusion, left LE consolidation. Seen by pulmonary, recommended IV lasix, incentive spirometry and complete course of antibiotics,  Needs repeat CT chest in 3-to 6 months. -8-19; increase work of breathing, worsening dyspnea. Repeated chest x ray worsening bilateral pleural effusion and atelectasis, continued diuretics -8/22 AM was noted to have increased distress again, pulmonary plans thoracentesis 8/23, xarelto  held -ambulate, out of bed, PT OT  3-Pneumonia;  -see above  4-Rash; Dr Chase Caller consulted surgery. Vasculitis panel RF 22, vasculitis panel negative Rash improving, no plan for biopsy.   4-Paroxysmal A fib;  Continue with amiodarone and xarelto.  -continue metoprolol Cardiology following -Heart rate remains suboptimally controlled, I wonder if this is causing recurrent fluid overload and distress  5-HTN; held losartan.   6-Hyperlipidemia; continue with statins.   7-Acute on chronic renal failure stage II; Avoid hypotension.  Renal US negative for hydronephrosis.  Stable.   8-History of NASH;   DVT prophylaxis:on xarelto-held for thoracentesis today, resume post [place]  Code Status: Full code.  Family Communication: care discussed with patient.  Disposition Plan: remains in stepdown, SNF when stable   Consultants:   Cardiology  CCM   Procedures:   Antimicrobials: Ceftriaxone 8-14  Subjective: -still continues to feel tired and short of breath with exertion -feels poorly, remains frustrated  Objective: Vitals:   09/08/17 0331 09/08/17 0431 09/08/17 0525 09/08/17 0756  BP: (!) 128/102 120/88  107/76  Pulse: 88   (!) 115  Resp: (!) 24  (!) 33 (!) 28  Temp: 98 F (36.7 C)   98.5 F (36.9 C)  TempSrc: Oral   Oral  SpO2: 98%   100%  Weight:   82.4 kg   Height:        Intake/Output Summary (Last 24 hours) at 09/08/2017 1107 Last data filed at 09/08/2017 0534 Gross per 24 hour  Intake -  Output 1000 ml  Net -1000 ml   Filed Weights   09/06/17 0230 09/07/17 0424 09/08/17 0525  Weight: 83.9 kg 85.1 kg 82.4 kg    Examination: Gen: Awake, Alert, Oriented X 3,  HEENT: PERRLA, Neck supple, no JVD Lungs: decreased breath sounds at both bases CVS: RRR,No Gallops,Rubs or new  Murmurs Abd: soft, Non tender, non distended, BS present Extremities: 1+ edema Skin: mild maculopapular rash improving  Data Reviewed: I have personally reviewed following labs and  imaging studies  CBC: Recent Labs  Lab 09/02/17 0336 09/03/17 0253 09/04/17 0423 09/05/17 0338 09/06/17 0322 09/07/17 0322 09/08/17 0328  WBC 6.3 6.4 6.6 7.3 7.5 8.4 7.9  NEUTROABS 4.7 4.9  --   --   --   --   --   HGB 11.6* 12.1 11.4* 11.9* 12.1 11.7* 11.7*  HCT 37.4 39.5 36.8 38.3 38.5 39.0 38.8  MCV 93.5 94.7 93.9 93.6 93.0 95.1 95.3  PLT 152 194 227 244 307 300 408   Basic Metabolic Panel: Recent Labs  Lab 09/04/17 0422 09/05/17 0338 09/06/17 0322 09/07/17 0322 09/08/17 0328  NA 141 141 139 143 141  K 4.2 4.2 4.5 4.0 3.9  CL 111 107 108 108 105  CO2 21* 23 20* 26 30  GLUCOSE 105* 128* 99 123* 124*  BUN 26* 26* 29* 30* 29*  CREATININE 1.21* 1.25* 1.35* 1.30* 1.21*  CALCIUM 8.7* 8.8* 8.5* 8.7* 9.0   GFR: Estimated Creatinine Clearance: 36.9 mL/min (A) (by C-G formula based on SCr of 1.21 mg/dL (H)). Liver Function Tests: No results for input(s): AST, ALT, ALKPHOS, BILITOT, PROT, ALBUMIN in the last 168 hours. No results for input(s): LIPASE, AMYLASE in the last 168 hours. No results for input(s): AMMONIA in the last 168 hours. Coagulation Profile: No results for input(s): INR, PROTIME in the last 168 hours. Cardiac Enzymes: No results for input(s): CKTOTAL, CKMB, CKMBINDEX, TROPONINI in the last 168 hours. BNP (last 3 results) Recent Labs    08/01/17 1017 08/04/17 1447  PROBNP 631.0* 574.0*   HbA1C: No results for input(s): HGBA1C in the last 72 hours. CBG: No results for input(s): GLUCAP in the last 168 hours. Lipid Profile: No results for input(s): CHOL, HDL, LDLCALC, TRIG, CHOLHDL, LDLDIRECT in the last 72 hours. Thyroid Function Tests: No results for input(s): TSH, T4TOTAL, FREET4, T3FREE, THYROIDAB in the last 72 hours. Anemia Panel: No results for input(s): VITAMINB12, FOLATE, FERRITIN, TIBC, IRON, RETICCTPCT in the last 72 hours. Sepsis Labs: Recent Labs  Lab 09/04/17 1859 09/05/17 0338 09/06/17 0933  PROCALCITON 0.13 <0.10 <0.10     Recent Results (from the past 240 hour(s))  Respiratory Panel by PCR     Status: None   Collection Time: 08/29/17  4:30 PM  Result Value Ref Range Status   Adenovirus NOT DETECTED NOT DETECTED Final   Coronavirus 229E NOT DETECTED NOT DETECTED Final   Coronavirus HKU1 NOT DETECTED NOT DETECTED Final   Coronavirus NL63 NOT DETECTED NOT DETECTED Final   Coronavirus OC43 NOT DETECTED NOT DETECTED Final   Metapneumovirus NOT DETECTED NOT DETECTED Final   Rhinovirus / Enterovirus NOT DETECTED NOT DETECTED Final   Influenza A NOT DETECTED NOT DETECTED Final   Influenza B NOT DETECTED NOT DETECTED Final   Parainfluenza Virus 1 NOT DETECTED NOT DETECTED Final   Parainfluenza Virus 2 NOT DETECTED NOT DETECTED Final   Parainfluenza Virus 3 NOT DETECTED NOT DETECTED Final   Parainfluenza Virus 4 NOT DETECTED NOT DETECTED Final   Respiratory Syncytial Virus NOT DETECTED NOT DETECTED Final   Bordetella pertussis NOT DETECTED NOT DETECTED Final   Chlamydophila pneumoniae NOT DETECTED NOT DETECTED Final   Mycoplasma pneumoniae NOT DETECTED NOT DETECTED Final    Comment: Performed at [facility], [place] [address] [place],  [address]  MRSA PCR Screening  Status: None   Collection Time: 08/29/17 11:29 PM  Result Value Ref Range Status   MRSA by PCR NEGATIVE NEGATIVE Final    Comment:        The GeneXpert MRSA Assay (FDA approved for NASAL specimens only), is one component of a comprehensive MRSA colonization surveillance program. It is not intended to diagnose MRSA infection nor to guide or monitor treatment for MRSA infections. Performed at [facility], [place] [address] [place], [place] [address]   Culture, blood (routine x 2)     Status: None   Collection Time: 08/30/17 12:25 AM  Result Value Ref Range Status   Specimen Description BLOOD LEFT HAND  Final   Special Requests   Final    BOTTLES DRAWN AEROBIC AND ANAEROBIC Blood Culture adequate volume   Culture    Final    NO GROWTH 5 DAYS Performed at [facility], [place] [address] [place], [place] [address]    Report Status 09/04/2017 FINAL  Final  Culture, blood (routine x 2)     Status: None   Collection Time: 08/30/17 12:30 AM  Result Value Ref Range Status   Specimen Description BLOOD RIGHT ANTECUBITAL  Final   Special Requests   Final    BOTTLES DRAWN AEROBIC ONLY Blood Culture adequate volume   Culture   Final    NO GROWTH 5 DAYS Performed at [facility], [place] [address] [place], [place] [address]    Report Status 09/04/2017 FINAL  Final         Radiology Studies: No results found.      Scheduled Meds: . amiodarone  200 mg Oral BID  . atorvastatin  40 mg Oral q1800  . fluticasone  2 spray Each Nare Daily  . furosemide  40 mg Intravenous Q12H  . guaiFENesin  1,200 mg Oral BID  . mouth rinse  15 mL Mouth Rinse BID  . metoprolol tartrate  50 mg Oral BID  . sodium chloride flush  3 mL Intravenous Q12H   Continuous Infusions: . sodium chloride       LOS: 9 days    Time spent: 35 minutes.     Domenic Polite, MD [facility]  Page via [email or web address] If 7PM-7AM, please contact night-coverage [email or web address] Password [facility] 09/08/2017, 11:07 AM

## 2017-09-08 NOTE — Progress Notes (Addendum)
4:10 pm Patient's son called CSW and indicated their choice is [facility]. CSW confirmed bed offer with [facility] and the facility will start patient's [facility] authorization this afternoon. Patient requires auth before admitting to the facility. Patient's son will complete paperwork at the facility.  3:27 pm CSW reviewed SNF bed offers with patient at bedside. Patient indicated her son is coming this evening and she will discuss with him and make a choice. Patient will require [facility] authorization before admitting to a facility. CSW to follow and support.  Estanislado Emms, [place]

## 2017-09-08 NOTE — Procedures (Addendum)
Thoracentesis Procedure Note  Pre-operative Diagnosis: Pleural Effusions secondary to CHF  Post-operative Diagnosis: same  Indications: Pleural effusions   Procedure Details  Consent: Informed consent was obtained. Risks of the procedure were discussed including: infection, bleeding, pain, pneumothorax.  Under sterile conditions the patient was positioned. Betadine solution and sterile drapes were utilized.  1% plain lidocaine was used to anesthetize the space identified by real time [place]. Fluid was obtained without any difficulties and minimal blood loss.  A dressing was applied to the wound and wound care instructions were provided.       Findings 650 ml of clear pleural fluid was obtained. A sample was sent to Pathology for cytogenetics, flow, and cell counts, as well as for infection analysis.  Complications:  None; patient tolerated the procedure well.          Condition: stable  Seawell, Jaimie A, DO 09/08/2017, 1:56 PM Pager: [phone]  I was present and scrubbed in for entire procedure   Erick Colace ACNP-BC [place] Pager # [phone] OR # [phone] if no answer

## 2017-09-09 LAB — CBC
HEMATOCRIT: 39.4 % (ref 36.0–46.0)
HEMOGLOBIN: 11.9 g/dL — AB (ref 12.0–15.0)
MCH: 28.5 pg (ref 26.0–34.0)
MCHC: 30.2 g/dL (ref 30.0–36.0)
MCV: 94.3 fL (ref 78.0–100.0)
Platelets: 315 10*3/uL (ref 150–400)
RBC: 4.18 MIL/uL (ref 3.87–5.11)
RDW: 14.3 % (ref 11.5–15.5)
WBC: 8.3 10*3/uL (ref 4.0–10.5)

## 2017-09-09 LAB — BASIC METABOLIC PANEL
ANION GAP: 8 (ref 5–15)
BUN: 28 mg/dL — ABNORMAL HIGH (ref 8–23)
CALCIUM: 9 mg/dL (ref 8.9–10.3)
CO2: 31 mmol/L (ref 22–32)
Chloride: 101 mmol/L (ref 98–111)
Creatinine, Ser: 1.16 mg/dL — ABNORMAL HIGH (ref 0.44–1.00)
GFR calc non Af Amer: 43 mL/min — ABNORMAL LOW (ref >60)
GFR, EST AFRICAN AMERICAN: 50 mL/min — AB (ref >60)
Glucose, Bld: 107 mg/dL — ABNORMAL HIGH (ref 70–99)
Potassium: 3.9 mmol/L (ref 3.5–5.1)
Sodium: 140 mmol/L (ref 135–145)

## 2017-09-09 MED ORDER — RIVAROXABAN 15 MG PO TABS
15.0000 mg | ORAL_TABLET | Freq: Every day | ORAL | Status: DC
  Administered 2017-09-09 – 2017-09-11 (×3): 15 mg via ORAL
  Filled 2017-09-09 (×3): qty 1

## 2017-09-09 MED ORDER — SENNOSIDES-DOCUSATE SODIUM 8.6-50 MG PO TABS
1.0000 | ORAL_TABLET | Freq: Two times a day (BID) | ORAL | Status: DC
  Administered 2017-09-09: 1 via ORAL
  Filled 2017-09-09 (×4): qty 1

## 2017-09-09 MED ORDER — METOPROLOL TARTRATE 25 MG PO TABS
75.0000 mg | ORAL_TABLET | Freq: Two times a day (BID) | ORAL | Status: DC
  Administered 2017-09-09: 75 mg via ORAL
  Filled 2017-09-09: qty 1

## 2017-09-09 MED ORDER — METOPROLOL TARTRATE 25 MG PO TABS
25.0000 mg | ORAL_TABLET | Freq: Once | ORAL | Status: AC
  Administered 2017-09-09: 25 mg via ORAL
  Filled 2017-09-09: qty 1

## 2017-09-09 NOTE — Progress Notes (Signed)
PROGRESS NOTE    [name]  QDI:[id number] DOB: December 20, 1935 DOA: 08/29/2017 PCP: [name], MD    Brief Narrative: Brenda Matthews is a very pleasant 82 y.o. female with medical history significant for paroxysmal A. Fib on anticoagulation, hypertension, hyperlipidemia, chronic renal insufficiency stage II, autoimmune hepatitis presents to the emergency department with the chief complaint of chest pain shortness of breath generalized weakness. Initial evaluation reveals max temp of 100.6, EKG with atrial fibrillation and heart rate of 106, acute kidney injury.  Patient was transfered to ICU due to hypotension. She received IV bolus, started on neo/. Now off pressors. Diagnosed with PNA. Started on ceftriaxone. -underwent cardiac catheterization which showed nonobstructive disease - hospitalization complicated by recurrent respiratory distress distress due to pleural effusions, despite diuresis and antibiotics    Assessment & Plan:   1-Hypotension;  cardiogenic vs sepsis.  -status post fluid resuscitation, was on neo for one overnight.  -Chest x ray with PNA, and pleural effusions -Treated with IV ceftriaxone and azithromycin for 8 days, blood cultures negative -Pro calcitonin has normalized -antibiotics discontinued 8/22  2-Acute Hypoxic Respiratory failure Multifactorial related to PNA, pleural effusion, pulmonary edema post fluid resuscitation.  -Cardiology consulted. Underwent cath; non obstructive  -8-17;Worsening hypoxemia and dyspnea CT chest with small pleural effusion, left LE consolidation. Seen by pulmonary, recommended IV lasix, incentive spirometry and complete course of antibiotics,  Needs repeat CT chest in 3-to 6 months. -8-19; increase work of breathing, worsening dyspnea. Repeated chest x ray worsening bilateral pleural effusion and atelectasis, continued diuretics -8/22 AM was noted to have increased distress again -8/23-underwent bilateral thoracentesis, a  total of 1.3 L strained -Respiratory status improving, continue IV Lasix today  3-Pneumonia;  -see above  4-Rash; Dr Chase Caller consulted surgery. Vasculitis panel RF 22, vasculitis panel negative Rash improving, no plan for biopsy.   4-Paroxysmal A fib;  Continue with amiodarone and xarelto.  -heart rate remains suboptimal, continue metoprolol, dose increased to 75 mg twice a day Cardiology following  5-HTN; held losartan.   6-Hyperlipidemia; continue with statins.   7-Acute on chronic renal failure stage II; Avoid hypotension.  Renal US negative for hydronephrosis.  Stable.   8-History of NASH;   DVT prophylaxis: xarelto resumed Code Status: Full code.  Family Communication: care discussed with patient.  Disposition Plan: remains in stepdown, SNF when stable   Consultants:   Cardiology  CCM   Procedures:   Antimicrobials: Ceftriaxone 8-14  Subjective: -breathing finally improving  Objective: Vitals:   09/09/17 0500 09/09/17 0749 09/09/17 0950 09/09/17 1246  BP:  115/83  131/82  Pulse:  (!) 117    Resp:  (!) 22 (!) 22 (!) 35  Temp:  97.6 F (36.4 C)  98.1 F (36.7 C)  TempSrc:  Oral  Oral  SpO2:  97%  96%  Weight: 83 kg     Height:        Intake/Output Summary (Last 24 hours) at 09/09/2017 1307 Last data filed at 09/09/2017 1119 Gross per 24 hour  Intake -  Output 850 ml  Net -850 ml   Filed Weights   09/07/17 0424 09/08/17 0525 09/09/17 0500  Weight: 85.1 kg 82.4 kg 83 kg    Examination:  Gen: Awake, Alert, Oriented X 3, no distress HEENT: PERRLA, Neck supple, no JVD Lungs: decreased breath sounds at both bases CVS: S1-S2/irregularly irregular Abd: soft, Non tender, non distended, BS present Extremities: 1-2+ edema Skin: mild maculopapular rash improving  Data Reviewed: I have  personally reviewed following labs and imaging studies  CBC: Recent Labs  Lab 09/03/17 0253  09/05/17 0338 09/06/17 0322 09/07/17 0322 09/08/17 0328  09/09/17 0757  WBC 6.4   < > 7.3 7.5 8.4 7.9 8.3  NEUTROABS 4.9  --   --   --   --   --   --   HGB 12.1   < > 11.9* 12.1 11.7* 11.7* 11.9*  HCT 39.5   < > 38.3 38.5 39.0 38.8 39.4  MCV 94.7   < > 93.6 93.0 95.1 95.3 94.3  PLT 194   < > 244 307 300 285 315   < > = values in this interval not displayed.   Basic Metabolic Panel: Recent Labs  Lab 09/05/17 0338 09/06/17 0322 09/07/17 0322 09/08/17 0328 09/09/17 0757  NA 141 139 143 141 140  K 4.2 4.5 4.0 3.9 3.9  CL 107 108 108 105 101  CO2 23 20* 26 30 31   GLUCOSE 128* 99 123* 124* 107*  BUN 26* 29* 30* 29* 28*  CREATININE 1.25* 1.35* 1.30* 1.21* 1.16*  CALCIUM 8.8* 8.5* 8.7* 9.0 9.0   GFR: Estimated Creatinine Clearance: 38.7 mL/min (A) (by C-G formula based on SCr of 1.16 mg/dL (H)). Liver Function Tests: Recent Labs  Lab 09/08/17 1209  PROT 7.0   No results for input(s): LIPASE, AMYLASE in the last 168 hours. No results for input(s): AMMONIA in the last 168 hours. Coagulation Profile: No results for input(s): INR, PROTIME in the last 168 hours. Cardiac Enzymes: No results for input(s): CKTOTAL, CKMB, CKMBINDEX, TROPONINI in the last 168 hours. BNP (last 3 results) Recent Labs    08/01/17 1017 08/04/17 1447  PROBNP 631.0* 574.0*   HbA1C: No results for input(s): HGBA1C in the last 72 hours. CBG: No results for input(s): GLUCAP in the last 168 hours. Lipid Profile: Recent Labs    09/08/17 1209  CHOL 100   Thyroid Function Tests: No results for input(s): TSH, T4TOTAL, FREET4, T3FREE, THYROIDAB in the last 72 hours. Anemia Panel: No results for input(s): VITAMINB12, FOLATE, FERRITIN, TIBC, IRON, RETICCTPCT in the last 72 hours. Sepsis Labs: Recent Labs  Lab 09/04/17 1859 09/05/17 0338 09/06/17 0933  PROCALCITON 0.13 <0.10 <0.10    Recent Results (from the past 240 hour(s))  Gram stain     Status: None   Collection Time: 09/08/17  1:48 PM  Result Value Ref Range Status   Specimen Description  THORACENTESIS PLEURAL  Final   Special Requests NONE  Final   Gram Stain   Final    NO WBC SEEN NO ORGANISMS SEEN Performed at [facility], [address] [place], [place] [address]    Report Status 09/08/2017 FINAL  Final  Culture, body fluid-bottle     Status: None (Preliminary result)   Collection Time: 09/08/17  1:48 PM  Result Value Ref Range Status   Specimen Description PLEURAL RIGHT  Final   Special Requests NONE  Final   Culture   Final    NO GROWTH < 24 HOURS Performed at [facility], [place] [address] [place], [place] [address]    Report Status PENDING  Incomplete  Body fluid culture     Status: None (Preliminary result)   Collection Time: 09/08/17  1:53 PM  Result Value Ref Range Status   Specimen Description THORACENTESIS  Final   Special Requests Normal  Final   Gram Stain   Final    CYTOSPIN SMEAR WBC PRESENT,BOTH PMN  AND MONONUCLEAR NO ORGANISMS SEEN    Culture   Final    NO GROWTH < 24 HOURS Performed at [facility], [place] [address], [place], [place] [address]    Report Status PENDING  Incomplete         Radiology Studies: Dg Chest Port 1 View  Result Date: 09/08/2017 CLINICAL DATA:  Status post left thoracentesis EXAM: PORTABLE CHEST 1 VIEW COMPARISON:  09/08/2017 FINDINGS: Cardiac shadow remains enlarged. Aortic calcifications are again seen. Significant reduction in left-sided pleural effusion is noted following thoracentesis. No pneumothorax is noted. Right lung remains well aerated following previous right thoracentesis. No bony abnormality is noted. IMPRESSION: No pneumothorax following left-sided thoracentesis. Electronically Signed   By: Inez Catalina M.D.   On: 09/08/2017 14:16   Dg Chest Port 1 View  Result Date: 09/08/2017 CLINICAL DATA:  Shortness of breath.  Status post thoracentesis EXAM: PORTABLE CHEST 1 VIEW COMPARISON:  September 06, 2017 FINDINGS: No evident pneumothorax. There is left lower lobe consolidation with left  pleural effusion. There has been resolution of pleural effusion on the right. The right lung is clear. There is stable cardiomegaly with pulmonary vascularity normal. No adenopathy. There is aortic atherosclerosis. No bone lesions. IMPRESSION: Resolution of right pleural effusion. No pneumothorax. There is a left pleural effusion with left lower lobe consolidation, increased from 2 days prior. Stable cardiomegaly. There is aortic atherosclerosis. No evident adenopathy. Aortic Atherosclerosis (ICD10-I70.0). Electronically Signed   By: Lowella Grip III M.D.   On: 09/08/2017 13:02        Scheduled Meds: . amiodarone  200 mg Oral BID  . atorvastatin  40 mg Oral q1800  . fluticasone  2 spray Each Nare Daily  . furosemide  40 mg Intravenous Q12H  . guaiFENesin  1,200 mg Oral BID  . mouth rinse  15 mL Mouth Rinse BID  . metoprolol tartrate  75 mg Oral BID  . rivaroxaban  15 mg Oral Q supper  . senna-docusate  1 tablet Oral BID  . sodium chloride flush  3 mL Intravenous Q12H   Continuous Infusions: . sodium chloride       LOS: 10 days    Time spent: 35 minutes.     Domenic Polite, MD [facility]  Page via [email or web address] If 7PM-7AM, please contact night-coverage [email or web address] Password [email or web address] 09/09/2017, 1:07 PM

## 2017-09-09 NOTE — Progress Notes (Signed)
[place] for Xarelto  Indication: atrial fibrillation  Allergies  Allergen Reactions  . Augmentin [Amoxicillin-Pot Clavulanate] Nausea And Vomiting and Other (See Comments)    "projectile vomiting" Has patient had a PCN reaction causing immediate rash, facial/tongue/throat swelling, SOB or lightheadedness with hypotension:No Has patient had a PCN reaction causing severe rash involving mucus membranes or skin necrosis:No Has patient had a PCN reaction that required hospitalization:No Has patient had a PCN reaction occurring within the last 10 years:Yes If all of the above answers are "NO", then may proceed with Cephalosporin use.     Patient Measurements: Height: 5' 2"  (157.5 cm) Weight: 182 lb 15.7 oz (83 kg) IBW/kg (Calculated) : 50.1 Heparin Dosing Weight: 67.1 kg  Vital Signs: Temp: 97.6 F (36.4 C) (08/24 0749) Temp Source: Oral (08/24 0749) BP: 115/83 (08/24 0749) Pulse Rate: 117 (08/24 0749)  Labs: Recent Labs    09/07/17 0322 09/08/17 0328 09/09/17 0757  HGB 11.7* 11.7* 11.9*  HCT 39.0 38.8 39.4  PLT 300 285 315  CREATININE 1.30* 1.21* 1.16*    Estimated Creatinine Clearance: 38.7 mL/min (A) (by C-G formula based on SCr of 1.16 mg/dL (H)).  Assessment: 82 year old female with Afib s/p LHC and resumed on Xarelto. Patient takes Xarelto 20 mg daily at home but was dose reduced given worsened renal fx and age. Xarelto was held for a thoracentesis but restarting today. CrCl ~ 40 mL/min. Hgb stable, Plt wnl.   Plan:  Restart Xarelto 15 mg daily for now Will monitor renal fx  Harrietta Guardian, PharmD PGY1 Pharmacy Resident  09/09/2017    9:16 AM

## 2017-09-09 NOTE — Progress Notes (Signed)
Progress Note  Patient Name: Brenda Matthews Date of Encounter: 09/09/2017  Primary Cardiologist: [name], MD   Subjective   SOB much improved after thoracentesis  Inpatient Medications    Scheduled Meds: . amiodarone  200 mg Oral BID  . atorvastatin  40 mg Oral q1800  . fluticasone  2 spray Each Nare Daily  . furosemide  40 mg Intravenous Q12H  . guaiFENesin  1,200 mg Oral BID  . mouth rinse  15 mL Mouth Rinse BID  . metoprolol tartrate  50 mg Oral BID  . rivaroxaban  15 mg Oral Q supper  . senna-docusate  1 tablet Oral BID  . sodium chloride flush  3 mL Intravenous Q12H   Continuous Infusions: . sodium chloride     PRN Meds: sodium chloride, acetaminophen, albuterol, ALPRAZolam, benzonatate, gi cocktail, guaiFENesin-dextromethorphan, ipratropium-albuterol, ondansetron (ZOFRAN) IV, sodium chloride flush   Vital Signs    Vitals:   09/09/17 0403 09/09/17 0500 09/09/17 0749 09/09/17 0950  BP: (!) 115/93  115/83   Pulse:   (!) 117   Resp: (!) 36  (!) 22 (!) 22  Temp: 97.8 F (36.6 C)  97.6 F (36.4 C)   TempSrc: Oral  Oral   SpO2: 95%  97%   Weight:  83 kg    Height:        Intake/Output Summary (Last 24 hours) at 09/09/2017 1059 Last data filed at 09/09/2017 6734 Gross per 24 hour  Intake -  Output 900 ml  Net -900 ml   Filed Weights   09/07/17 0424 09/08/17 0525 09/09/17 0500  Weight: 85.1 kg 82.4 kg 83 kg    Telemetry    afib variable rates - Personally Reviewed  ECG    na  Physical Exam   GEN: No acute distress.   Neck: No JVD Cardiac: irreg, no murmurs, rubs, or gallops.  Respiratory: Clear to auscultation bilaterally. GI: Soft, nontender, non-distended  MS:1+ bilateral LE edema; No deformity. Neuro:  Nonfocal  Psych: Normal affect   Labs    Chemistry Recent Labs  Lab 09/07/17 0322 09/08/17 0328 09/08/17 1209 09/09/17 0757  NA 143 141  --  140  K 4.0 3.9  --  3.9  CL 108 105  --  101  CO2 26 30  --  31  GLUCOSE 123*  124*  --  107*  BUN 30* 29*  --  28*  CREATININE 1.30* 1.21*  --  1.16*  CALCIUM 8.7* 9.0  --  9.0  PROT  --   --  7.0  --   GFRNONAA 38* 41*  --  43*  GFRAA 44* 48*  --  50*  ANIONGAP 9 6  --  8     Hematology Recent Labs  Lab 09/07/17 0322 09/08/17 0328 09/09/17 0757  WBC 8.4 7.9 8.3  RBC 4.10 4.07 4.18  HGB 11.7* 11.7* 11.9*  HCT 39.0 38.8 39.4  MCV 95.1 95.3 94.3  MCH 28.5 28.7 28.5  MCHC 30.0 30.2 30.2  RDW 14.6 14.5 14.3  PLT 300 285 315    Cardiac EnzymesNo results for input(s): TROPONINI in the last 168 hours. No results for input(s): TROPIPOC in the last 168 hours.   BNP Recent Labs  Lab 09/06/17 0322  BNP 654.9*     DDimer No results for input(s): DDIMER in the last 168 hours.   Radiology    Dg Chest Port 1 View  Result Date: 09/08/2017 CLINICAL DATA:  Status post left thoracentesis  EXAM: PORTABLE CHEST 1 VIEW COMPARISON:  09/08/2017 FINDINGS: Cardiac shadow remains enlarged. Aortic calcifications are again seen. Significant reduction in left-sided pleural effusion is noted following thoracentesis. No pneumothorax is noted. Right lung remains well aerated following previous right thoracentesis. No bony abnormality is noted. IMPRESSION: No pneumothorax following left-sided thoracentesis. Electronically Signed   By: Inez Catalina M.D.   On: 09/08/2017 14:16   Dg Chest Port 1 View  Result Date: 09/08/2017 CLINICAL DATA:  Shortness of breath.  Status post thoracentesis EXAM: PORTABLE CHEST 1 VIEW COMPARISON:  September 06, 2017 FINDINGS: No evident pneumothorax. There is left lower lobe consolidation with left pleural effusion. There has been resolution of pleural effusion on the right. The right lung is clear. There is stable cardiomegaly with pulmonary vascularity normal. No adenopathy. There is aortic atherosclerosis. No bone lesions. IMPRESSION: Resolution of right pleural effusion. No pneumothorax. There is a left pleural effusion with left lower lobe  consolidation, increased from 2 days prior. Stable cardiomegaly. There is aortic atherosclerosis. No evident adenopathy. Aortic Atherosclerosis (ICD10-I70.0). Electronically Signed   By: Lowella Grip III M.D.   On: 09/08/2017 13:02    Cardiac Studies     Patient Profile     82 y.o. female with medical history significantfor paroxysmal A. Fib on anticoagulation(Xarelto), hypertension, hyperlipidemia, chronic renal insufficiency stage II, autoimmune hepatitis presents to the emergency department with the chief complaint of chest pain shortness of breath generalized weakness.  Assessment & Plan    1. Pneumonia - admitted with pneumonia, bilateral pleural effusions. S/p thoracentesis   2. PAF - history of PAF - currently on amio 230m bid, lopressor 576mbid, xarelto - rates 80s currently, episodes of higher rates at times.  - increase lopressor to 7519mid.   3. Acute on chronic diastolic HF - 7/20/1779ho LVEF 50-55%, no WMAs, mild MR, mild to mod TR. Severe LAE. Cannot eval diastolic function due to afib, sever LAE would suggets abnormal - on lasix 82m95m bid, negative 900mL82mterday, negative 4 L since admission. Downtrending Cr/BUN with diuresis consistent with venous congestion and CHF - LVEDP by cath 09/01/17 was only 14.   - continue IV diuresis today.   4. Chest pain - trop elevation this admission with chest pai - 09/01/17 cath without significant CAD. LVEDP 14.      For questions or updates, please contact [facility] consult [email or web address] for contact info under Cardiology/STEMI.      SigneMerrily Pew 09/09/2017, 10:59 AM

## 2017-09-10 LAB — BASIC METABOLIC PANEL
Anion gap: 13 (ref 5–15)
BUN: 28 mg/dL — ABNORMAL HIGH (ref 8–23)
CALCIUM: 8.7 mg/dL — AB (ref 8.9–10.3)
CO2: 28 mmol/L (ref 22–32)
Chloride: 100 mmol/L (ref 98–111)
Creatinine, Ser: 1.14 mg/dL — ABNORMAL HIGH (ref 0.44–1.00)
GFR calc Af Amer: 51 mL/min — ABNORMAL LOW (ref >60)
GFR, EST NON AFRICAN AMERICAN: 44 mL/min — AB (ref >60)
GLUCOSE: 101 mg/dL — AB (ref 70–99)
POTASSIUM: 3.6 mmol/L (ref 3.5–5.1)
Sodium: 141 mmol/L (ref 135–145)

## 2017-09-10 LAB — CBC
HCT: 38.9 % (ref 36.0–46.0)
Hemoglobin: 11.9 g/dL — ABNORMAL LOW (ref 12.0–15.0)
MCH: 28.5 pg (ref 26.0–34.0)
MCHC: 30.6 g/dL (ref 30.0–36.0)
MCV: 93.3 fL (ref 78.0–100.0)
PLATELETS: 299 10*3/uL (ref 150–400)
RBC: 4.17 MIL/uL (ref 3.87–5.11)
RDW: 14.2 % (ref 11.5–15.5)
WBC: 7.7 10*3/uL (ref 4.0–10.5)

## 2017-09-10 MED ORDER — METOPROLOL TARTRATE 100 MG PO TABS
100.0000 mg | ORAL_TABLET | Freq: Two times a day (BID) | ORAL | Status: DC
  Administered 2017-09-10 – 2017-09-11 (×3): 100 mg via ORAL
  Filled 2017-09-10 (×3): qty 1

## 2017-09-10 NOTE — Progress Notes (Signed)
Progress Note  Patient Name: Brenda Matthews Date of Encounter: 09/10/2017  Primary Cardiologist: [name], MD   Subjective   Breathing is improving  Inpatient Medications    Scheduled Meds: . amiodarone  200 mg Oral BID  . atorvastatin  40 mg Oral q1800  . fluticasone  2 spray Each Nare Daily  . furosemide  40 mg Intravenous Q12H  . guaiFENesin  1,200 mg Oral BID  . mouth rinse  15 mL Mouth Rinse BID  . metoprolol tartrate  75 mg Oral BID  . rivaroxaban  15 mg Oral Q supper  . senna-docusate  1 tablet Oral BID  . sodium chloride flush  3 mL Intravenous Q12H   Continuous Infusions: . sodium chloride     PRN Meds: sodium chloride, acetaminophen, albuterol, ALPRAZolam, benzonatate, gi cocktail, guaiFENesin-dextromethorphan, ipratropium-albuterol, ondansetron (ZOFRAN) IV, sodium chloride flush   Vital Signs    Vitals:   09/09/17 1727 09/09/17 2016 09/09/17 2348 09/10/17 0502  BP: 128/73 128/73 115/81 115/70  Pulse:  (!) 111 99 (!) 111  Resp: (!) 21 20 20 20   Temp:  98.3 F (36.8 C) 98.6 F (37 C) 97.7 F (36.5 C)  TempSrc:  Oral Oral Oral  SpO2: 95% 96% 97% 96%  Weight:    80.6 kg  Height:        Intake/Output Summary (Last 24 hours) at 09/10/2017 0732 Last data filed at 09/10/2017 0515 Gross per 24 hour  Intake 530 ml  Output 1300 ml  Net -770 ml   Filed Weights   09/08/17 0525 09/09/17 0500 09/10/17 0502  Weight: 82.4 kg 83 kg 80.6 kg    Telemetry    afib rates 80s-120s - Personally Reviewed  ECG    na  Physical Exam   GEN: No acute distress.   Neck: mildly elevated JVD Cardiac: irreg, no murmurs, rubs, or gallops.  Respiratory: Clear to auscultation bilaterally. GI: Soft, nontender, non-distended  MS: 1+ bilateral LE edema; No deformity. Neuro:  Nonfocal  Psych: Normal affect   Labs    Chemistry Recent Labs  Lab 09/08/17 0328 09/08/17 1209 09/09/17 0757 09/10/17 0453  NA 141  --  140 141  K 3.9  --  3.9 3.6  CL 105  --   101 100  CO2 30  --  31 28  GLUCOSE 124*  --  107* 101*  BUN 29*  --  28* 28*  CREATININE 1.21*  --  1.16* 1.14*  CALCIUM 9.0  --  9.0 8.7*  PROT  --  7.0  --   --   GFRNONAA 41*  --  43* 44*  GFRAA 48*  --  50* 51*  ANIONGAP 6  --  8 13     Hematology Recent Labs  Lab 09/08/17 0328 09/09/17 0757 09/10/17 0453  WBC 7.9 8.3 7.7  RBC 4.07 4.18 4.17  HGB 11.7* 11.9* 11.9*  HCT 38.8 39.4 38.9  MCV 95.3 94.3 93.3  MCH 28.7 28.5 28.5  MCHC 30.2 30.2 30.6  RDW 14.5 14.3 14.2  PLT 285 315 299    Cardiac EnzymesNo results for input(s): TROPONINI in the last 168 hours. No results for input(s): TROPIPOC in the last 168 hours.   BNP Recent Labs  Lab 09/06/17 0322  BNP 654.9*     DDimer No results for input(s): DDIMER in the last 168 hours.   Radiology    Dg Chest Port 1 View  Result Date: 09/08/2017 CLINICAL DATA:  Status post  left thoracentesis EXAM: PORTABLE CHEST 1 VIEW COMPARISON:  09/08/2017 FINDINGS: Cardiac shadow remains enlarged. Aortic calcifications are again seen. Significant reduction in left-sided pleural effusion is noted following thoracentesis. No pneumothorax is noted. Right lung remains well aerated following previous right thoracentesis. No bony abnormality is noted. IMPRESSION: No pneumothorax following left-sided thoracentesis. Electronically Signed   By: Inez Catalina M.D.   On: 09/08/2017 14:16   Dg Chest Port 1 View  Result Date: 09/08/2017 CLINICAL DATA:  Shortness of breath.  Status post thoracentesis EXAM: PORTABLE CHEST 1 VIEW COMPARISON:  September 06, 2017 FINDINGS: No evident pneumothorax. There is left lower lobe consolidation with left pleural effusion. There has been resolution of pleural effusion on the right. The right lung is clear. There is stable cardiomegaly with pulmonary vascularity normal. No adenopathy. There is aortic atherosclerosis. No bone lesions. IMPRESSION: Resolution of right pleural effusion. No pneumothorax. There is a left  pleural effusion with left lower lobe consolidation, increased from 2 days prior. Stable cardiomegaly. There is aortic atherosclerosis. No evident adenopathy. Aortic Atherosclerosis (ICD10-I70.0). Electronically Signed   By: Lowella Grip III M.D.   On: 09/08/2017 13:02    Cardiac Studies     Patient Profile     82 y.o.femalewith medical history significantfor paroxysmal A. Fib on anticoagulation(Xarelto), hypertension, hyperlipidemia, chronic renal insufficiency stage II, autoimmune hepatitis presents to the emergency department with the chief complaint of chest pain shortness of breath generalized weakness.  Assessment & Plan    1. Pneumonia - admitted with pneumonia, bilateral pleural effusions. S/p thoracentesis   2. PAF - history of PAF - currently on amio 278m bid, lopressor 734mbid, xarelto - rates on average low 100s, yesterday we increased her lopressor to 7570mid. As infection resolves drive for tachycardia should decrease. BP's stable with lopressor increase. Increase lopressor to 100m61md today.   3. Acute on chronic diastolic HF - 7/209/7588o LVEF 50-55%, no WMAs, mild MR, mild to mod TR. Severe LAE. Cannot eval diastolic function due to afib, sever LAE would suggets abnormal - on lasix 40mg19mbid, negative 770mL 75merday, negative 4.8 L since admission. Downtrending Cr/BUN with diuresis consistent with venous congestion and CHF - LVEDP by cath 09/01/17 was only 14.  - breathing much improved after bilateral thoracentesis  - continue IV diuresis today. Remains volume up by exam.   4. Chest pain - trop elevation this admission with chest pain - 09/01/17 cath without significant CAD.   For questions or updates, please contact [facility] consult [email or web address] for contact info under Cardiology/STEMI.      SignedMerrily Pew8/25/2019, 7:32 AM

## 2017-09-10 NOTE — Progress Notes (Signed)
PROGRESS NOTE    Brenda Matthews  YQI:[id number] DOB: 1935/11/17 DOA: 08/29/2017 PCP: [name], MD    Brief Narrative: Brenda Matthews is a very pleasant 82 y.o. female with medical history significant for paroxysmal A. Fib on anticoagulation, hypertension, hyperlipidemia, chronic renal insufficiency stage II, autoimmune hepatitis presents to the emergency department with the chief complaint of chest pain shortness of breath generalized weakness. Initial evaluation reveals max temp of 100.6, EKG with atrial fibrillation and heart rate of 106, acute kidney injury.  Patient was transfered to ICU due to hypotension. She received IV bolus, started on neo/. Now off pressors. Diagnosed with PNA. Started on ceftriaxone. -underwent cardiac catheterization which showed nonobstructive disease - hospitalization complicated by recurrent respiratory distress distress due to pleural effusions, despite diuresis and antibiotics -improving  Assessment & Plan:   1-Pneumonia/Sepsis w/ shock -status post fluid resuscitation, was on neo for one overnight.  -Chest x ray with PNA, and pleural effusions -Treated with IV ceftriaxone and azithromycin for 8 days, blood cultures negative -Pro calcitonin has normalized -antibiotics discontinued 8/22  2-Acute Hypoxic Respiratory failure Multifactorial related to PNA, pleural effusion, pulmonary edema post fluid resuscitation.  -Cardiology consulted. Underwent cath; non obstructive  -8-17;Worsening hypoxemia and dyspnea CT chest with small pleural effusion, left LE consolidation. Seen by pulmonary, recommended IV lasix, incentive spirometry and complete course of antibiotics,  Needs repeat CT chest in 3-to 6 months. -8-19; increase work of breathing, worsening dyspnea. Repeated chest x ray worsening bilateral pleural effusion and atelectasis, continued diuretics -8/22 AM was noted to have increased distress again -8/23-underwent bilateral thoracentesis, a total of  1.3 L strained -Respiratory status improving, continue IV Lasix -she is 4.8L  3-Pneumonia;  -see above  4-Rash; Dr Chase Caller consulted surgery. Vasculitis panel RF 22, vasculitis panel negative Rash improving, no plan for biopsy.   4-Paroxysmal A fib;  Continue with amiodarone and xarelto.  -heart rate remains suboptimal, continue metoprolol, dose increased to 75 mg twice a day Cardiology following  5-HTN; held losartan.   6-Hyperlipidemia; continue with statins.   7-Acute on chronic renal failure stage II; -Avoid hypotension.  -Renal US negative for hydronephrosis.  -Stable.   8-History of NASH;   DVT prophylaxis: xarelto resumed Code Status: Full code.  Family Communication: care discussed with patient.  Disposition Plan: SNF in 1-2days if stable   Consultants:   Cardiology  CCM   Procedures:   Antimicrobials: Ceftriaxone 8-14  Subjective: -breathing finally improving  Objective: Vitals:   09/10/17 0800 09/10/17 0900 09/10/17 0933 09/10/17 1235  BP:   106/80 112/85  Pulse:    82  Resp: (!) 31 (!) 36 (!) 34 (!) 36  Temp:   97.6 F (36.4 C)   TempSrc:   Oral   SpO2: 99% 97% 98% 97%  Weight:      Height:        Intake/Output Summary (Last 24 hours) at 09/10/2017 1244 Last data filed at 09/10/2017 0515 Gross per 24 hour  Intake 480 ml  Output 900 ml  Net -420 ml   Filed Weights   09/08/17 0525 09/09/17 0500 09/10/17 0502  Weight: 82.4 kg 83 kg 80.6 kg    Examination:  Gen: Awake, Alert, Oriented X 3,  HEENT: PERRLA, Neck supple, no JVD Lungs: decreased BS at both bases CVS: S1S2/Irregular irregular Abd: soft, Non tender, non distended, BS present Extremities: 1-2+ edema Skin: mild maculopapular rash improving  Data Reviewed: I have personally reviewed following labs and imaging studies  CBC: Recent Labs  Lab 09/06/17 0322 09/07/17 0322 09/08/17 0328 09/09/17 0757 09/10/17 0453  WBC 7.5 8.4 7.9 8.3 7.7  HGB 12.1 11.7* 11.7*  11.9* 11.9*  HCT 38.5 39.0 38.8 39.4 38.9  MCV 93.0 95.1 95.3 94.3 93.3  PLT 307 300 285 315 263   Basic Metabolic Panel: Recent Labs  Lab 09/06/17 0322 09/07/17 0322 09/08/17 0328 09/09/17 0757 09/10/17 0453  NA 139 143 141 140 141  K 4.5 4.0 3.9 3.9 3.6  CL 108 108 105 101 100  CO2 20* 26 30 31 28   GLUCOSE 99 123* 124* 107* 101*  BUN 29* 30* 29* 28* 28*  CREATININE 1.35* 1.30* 1.21* 1.16* 1.14*  CALCIUM 8.5* 8.7* 9.0 9.0 8.7*   GFR: Estimated Creatinine Clearance: 38.7 mL/min (A) (by C-G formula based on SCr of 1.14 mg/dL (H)). Liver Function Tests: Recent Labs  Lab 09/08/17 1209  PROT 7.0   No results for input(s): LIPASE, AMYLASE in the last 168 hours. No results for input(s): AMMONIA in the last 168 hours. Coagulation Profile: No results for input(s): INR, PROTIME in the last 168 hours. Cardiac Enzymes: No results for input(s): CKTOTAL, CKMB, CKMBINDEX, TROPONINI in the last 168 hours. BNP (last 3 results) Recent Labs    08/01/17 1017 08/04/17 1447  PROBNP 631.0* 574.0*   HbA1C: No results for input(s): HGBA1C in the last 72 hours. CBG: No results for input(s): GLUCAP in the last 168 hours. Lipid Profile: Recent Labs    09/08/17 1209  CHOL 100   Thyroid Function Tests: No results for input(s): TSH, T4TOTAL, FREET4, T3FREE, THYROIDAB in the last 72 hours. Anemia Panel: No results for input(s): VITAMINB12, FOLATE, FERRITIN, TIBC, IRON, RETICCTPCT in the last 72 hours. Sepsis Labs: Recent Labs  Lab 09/04/17 1859 09/05/17 0338 09/06/17 0933  PROCALCITON 0.13 <0.10 <0.10    Recent Results (from the past 240 hour(s))  Gram stain     Status: None   Collection Time: 09/08/17  1:48 PM  Result Value Ref Range Status   Specimen Description THORACENTESIS PLEURAL  Final   Special Requests NONE  Final   Gram Stain   Final    NO WBC SEEN NO ORGANISMS SEEN Performed at [facility], [address], [place], [place] [address]    Report Status  09/08/2017 FINAL  Final  Culture, body fluid-bottle     Status: None (Preliminary result)   Collection Time: 09/08/17  1:48 PM  Result Value Ref Range Status   Specimen Description PLEURAL RIGHT  Final   Special Requests NONE  Final   Culture   Final    NO GROWTH < 24 HOURS Performed at [facility], [place] [address] [place], [place] [address]    Report Status PENDING  Incomplete  Body fluid culture     Status: None (Preliminary result)   Collection Time: 09/08/17  1:53 PM  Result Value Ref Range Status   Specimen Description THORACENTESIS  Final   Special Requests Normal  Final   Gram Stain   Final    CYTOSPIN SMEAR WBC PRESENT,BOTH PMN AND MONONUCLEAR NO ORGANISMS SEEN    Culture   Final    NO GROWTH 2 DAYS Performed at [facility], [place] [address], [place], [place] [address]    Report Status PENDING  Incomplete         Radiology Studies: Dg Chest Port 1 View  Result Date: 09/08/2017 CLINICAL DATA:  Status post left thoracentesis EXAM: PORTABLE  CHEST 1 VIEW COMPARISON:  09/08/2017 FINDINGS: Cardiac shadow remains enlarged. Aortic calcifications are again seen. Significant reduction in left-sided pleural effusion is noted following thoracentesis. No pneumothorax is noted. Right lung remains well aerated following previous right thoracentesis. No bony abnormality is noted. IMPRESSION: No pneumothorax following left-sided thoracentesis. Electronically Signed   By: Inez Catalina M.D.   On: 09/08/2017 14:16   Dg Chest Port 1 View  Result Date: 09/08/2017 CLINICAL DATA:  Shortness of breath.  Status post thoracentesis EXAM: PORTABLE CHEST 1 VIEW COMPARISON:  September 06, 2017 FINDINGS: No evident pneumothorax. There is left lower lobe consolidation with left pleural effusion. There has been resolution of pleural effusion on the right. The right lung is clear. There is stable cardiomegaly with pulmonary vascularity normal. No adenopathy. There is aortic atherosclerosis. No  bone lesions. IMPRESSION: Resolution of right pleural effusion. No pneumothorax. There is a left pleural effusion with left lower lobe consolidation, increased from 2 days prior. Stable cardiomegaly. There is aortic atherosclerosis. No evident adenopathy. Aortic Atherosclerosis (ICD10-I70.0). Electronically Signed   By: Lowella Grip III M.D.   On: 09/08/2017 13:02        Scheduled Meds: . amiodarone  200 mg Oral BID  . atorvastatin  40 mg Oral q1800  . fluticasone  2 spray Each Nare Daily  . furosemide  40 mg Intravenous Q12H  . guaiFENesin  1,200 mg Oral BID  . mouth rinse  15 mL Mouth Rinse BID  . metoprolol tartrate  100 mg Oral BID  . rivaroxaban  15 mg Oral Q supper  . senna-docusate  1 tablet Oral BID  . sodium chloride flush  3 mL Intravenous Q12H   Continuous Infusions: . sodium chloride       LOS: 11 days    Time spent: 25 minutes.     Domenic Polite, MD [facility]  Page via [email or web address] If 7PM-7AM, please contact night-coverage [email or web address] Password [facility] 09/10/2017, 12:44 PM

## 2017-09-11 DIAGNOSIS — I5032 Chronic diastolic (congestive) heart failure: Secondary | ICD-10-CM

## 2017-09-11 DIAGNOSIS — I5033 Acute on chronic diastolic (congestive) heart failure: Secondary | ICD-10-CM

## 2017-09-11 DIAGNOSIS — E782 Mixed hyperlipidemia: Secondary | ICD-10-CM

## 2017-09-11 LAB — BODY FLUID CULTURE
CULTURE: NO GROWTH
SPECIAL REQUESTS: NORMAL

## 2017-09-11 MED ORDER — METOPROLOL TARTRATE 100 MG PO TABS: 100.0000 mg | ORAL_TABLET | Freq: Two times a day (BID) | ORAL | 0 refills | Status: DC

## 2017-09-11 MED ORDER — POTASSIUM CHLORIDE ER 20 MEQ PO TBCR: 20.0000 meq | EXTENDED_RELEASE_TABLET | Freq: Two times a day (BID) | ORAL | Status: DC

## 2017-09-11 MED ORDER — AMIODARONE HCL 200 MG PO TABS: 200.0000 mg | ORAL_TABLET | Freq: Two times a day (BID) | ORAL | Status: DC

## 2017-09-11 MED ORDER — RIVAROXABAN 15 MG PO TABS: 15.0000 mg | ORAL_TABLET | Freq: Every day | ORAL | Status: DC

## 2017-09-11 NOTE — Progress Notes (Addendum)
Atrial fibrillation, persistent with only moderate rate control currently.  She has significant dyspnea on exertion.  Rate control is slowly improving with combination amiodarone and metoprolol.  If she does not have spontaneous conversion will ultimately need electrical cardioversion.  We will need to see cardiology in 10 to 14 days to consider medication dose adjustment based on heart rate.  Progress Note  Patient Name: Brenda Matthews Date of Encounter: 09/11/2017  Primary Cardiologist: [name], MD   Subjective   Feeling well. No chest pain, sob or palpitations.   Inpatient Medications    Scheduled Meds: . amiodarone  200 mg Oral BID  . atorvastatin  40 mg Oral q1800  . fluticasone  2 spray Each Nare Daily  . furosemide  40 mg Intravenous Q12H  . guaiFENesin  1,200 mg Oral BID  . mouth rinse  15 mL Mouth Rinse BID  . metoprolol tartrate  100 mg Oral BID  . rivaroxaban  15 mg Oral Q supper  . senna-docusate  1 tablet Oral BID  . sodium chloride flush  3 mL Intravenous Q12H   Continuous Infusions: . sodium chloride     PRN Meds: sodium chloride, acetaminophen, albuterol, ALPRAZolam, benzonatate, gi cocktail, guaiFENesin-dextromethorphan, ipratropium-albuterol, ondansetron (ZOFRAN) IV, sodium chloride flush   Vital Signs    Vitals:   09/11/17 0423 09/11/17 0503 09/11/17 0816 09/11/17 0900  BP: 123/79  111/80 114/81  Pulse: 88  93   Resp: (!) 30 (!) 34 (!) 22 (!) 23  Temp: (!) 97.5 F (36.4 C)  98 F (36.7 C)   TempSrc: Oral  Oral   SpO2: 100% 93% 94%   Weight:  80.2 kg    Height:        Intake/Output Summary (Last 24 hours) at 09/11/2017 1001 Last data filed at 09/11/2017 0800 Gross per 24 hour  Intake 630 ml  Output 1350 ml  Net -720 ml   Filed Weights   09/09/17 0500 09/10/17 0502 09/11/17 0503  Weight: 83 kg 80.6 kg 80.2 kg    Telemetry    Atrial fibrillation at rate of 80-90s- Personally Reviewed  ECG    N/A  Physical Exam    GEN: No acute distress.   Neck: No JVD Cardiac: RRR, no murmurs, rubs, or gallops.  Respiratory: Clear to auscultation bilaterally. GI: Soft, nontender, non-distended  MS: 1+ BL LE edema; No deformity. Neuro:  Nonfocal  Psych: Normal affect   Labs    Chemistry Recent Labs  Lab 09/08/17 0328 09/08/17 1209 09/09/17 0757 09/10/17 0453  NA 141  --  140 141  K 3.9  --  3.9 3.6  CL 105  --  101 100  CO2 30  --  31 28  GLUCOSE 124*  --  107* 101*  BUN 29*  --  28* 28*  CREATININE 1.21*  --  1.16* 1.14*  CALCIUM 9.0  --  9.0 8.7*  PROT  --  7.0  --   --   GFRNONAA 41*  --  43* 44*  GFRAA 48*  --  50* 51*  ANIONGAP 6  --  8 13     Hematology Recent Labs  Lab 09/08/17 0328 09/09/17 0757 09/10/17 0453  WBC 7.9 8.3 7.7  RBC 4.07 4.18 4.17  HGB 11.7* 11.9* 11.9*  HCT 38.8 39.4 38.9  MCV 95.3 94.3 93.3  MCH 28.7 28.5 28.5  MCHC 30.2 30.2 30.6  RDW 14.5 14.3 14.2  PLT 285 315 299  Cardiac EnzymesNo results for input(s): TROPONINI in the last 168 hours. No results for input(s): TROPIPOC in the last 168 hours.   BNP Recent Labs  Lab 09/06/17 0322  BNP 654.9*     DDimer No results for input(s): DDIMER in the last 168 hours.   Radiology    No results found.  Cardiac Studies   Echocardiogram 08/10/2017: Study Conclusions - Left ventricle: The cavity size was normal. Wall thickness was increased in a pattern of mild LVH. Systolic function was normal. The estimated ejection fraction was in the range of 50% to 55%. Wall motion was normal; there were no regional wall motion abnormalities. - Aortic valve: There was trivial regurgitation. - Aortic root: The aortic root was mildly dilated. - Mitral valve: There was mild regurgitation. - Left atrium: The atrium was severely dilated. - Right atrium: The atrium was mildly dilated. - Tricuspid valve: There was mild-moderate regurgitation. - Pulmonary arteries: PA peak pressure: 32 mm Hg (S). -  Pericardium, extracardiac: A trivial pericardial effusion was identified. Impressions: - Normal LV systolic function; mild LVH; trace AI; mildly dilated aortic root; mild MR; biatrial enlargement; mild to moderate TR.  09/01/2017 LHC and coronary angiography Conclusions: 1. No angiographically significant coronary artery disease. 2. Upper normal left ventricular filling pressure  Recommendations: 1. Continue medical therapy of diastolic heart failure and atrial fibrillation. 2. Gentle post-catheterization hydration given resolved acute kidney injury. 3. Restart heparin infusion 2 hours after TR band removal; rivaroxaban could be restarted as soon as tomorrow. Recommend to resume Rivaroxaban, at currently prescribed dose and frequency, on 09/01/17. Concurrent antiplatelet therapy not recommended.   Patient Profile     82 y.o.femalewith medical history significantfor paroxysmal A. Fib on anticoagulation(Xarelto), hypertension, hyperlipidemia, chronic renal insufficiency stage II, autoimmune hepatitis presents to the emergency department with the chief complaint of chest pain, shortness of breath and generalized weakness. Found to have pneumonia. Cardiology is asked for chest pain evaluation for NSTEMI or possible global subendocardial ischemia. Dr. Debara Pickett recommended cath which showed No angiographically significant coronary artery disease.   Assessment & Plan    1. Respiratory failure: -In the setting of PNA with bilateral pleural effusions S/p thoracentesis. Now breathing back to normal.   2. Persistent Atrial fibrillation (prior paroxysmal) -HR in the 80-100 range. Likely this episode is due to respiratory illness.  -Continue amiodarone 200 mg twice daily, metoprolol 100 mg twice daily -ContinueXarelto 15 mg daily  -CHA2DS2VASc at least 4  3. Acute on chronic presumed diastolic congestive heart failure: -Echocardiogram 08/10/2017 with LVEF of 50-55% and no significant  CAD per cath on 09/01/2017 -BNP, 654>>down from 1314 on admission  -Net I & O negative 5.4L. Weight now 176lb. Still has LE edema but resolved dyspnea. Per patient she is going to rehab facility today.  - Continue lasix 71m BID and consider addition of Kdur 154m BID. Recheck BEMT in 1 week with PCP.   4. Acute renal insufficiency: -Trending down   [facility] will sign off.   Medication Recommendations:  As recommended above Other recommendations (labs, testing, etc):  BMET in one week  Follow up as an outpatient:  With Dr. HiDebara Pickett/26  For questions or updates, please contact [facility] Please consult [email or web address] for contact info under Cardiology/STEMI.      Signed, BhLeanor KailPA  09/11/2017, 10:01 AM

## 2017-09-11 NOTE — Progress Notes (Signed)
Physical Therapy Treatment Patient Details Name: Brenda Matthews MRN: [id number] DOB: [birth year] Today's Date: 09/11/2017    History of Present Illness 82 year old with history of atrial fibrillation, CHF, hypertension, hyperlipidemia, chronic kidney disease, autoimmune hepatitisAdmitted on 8/13 with chest pain, dyspnea, rapid atrial fibrillation, AKI, sepsis.  She was in the ICU for 1 day for hypotension requiring pressors briefly.  Treated for pneumonia with antibioticsAlso seen by cardiology for atrial fibrillation.  Cardiac catheterization on 8/16 showed no significant coronary artery disease and upper normal left ventricular filling pressure.PCCM was consulted on 8/19 for dyspnea, hypoxia and chest x-ray showing bilateral effusions.  Plan is for bil thoracentesis on 09/08/17.      PT Comments    Pt admitted with above diagnosis. Pt currently with functional limitations due to balance and endurance deficits. Pt was able to ambulate with RW without physical assist. No LOB although not challenged.  DOE 2/4 after walk.  Had to have 1 seated rest break and ambualated x 2.   Pt will benefit from skilled PT to increase their independence and safety with mobility to allow discharge to the venue listed below.     Follow Up Recommendations  SNF;Supervision/Assistance - 24 hour     Equipment Recommendations  Other (comment)(TBA at next venue)    Recommendations for Other Services       Precautions / Restrictions Precautions Precautions: Fall Restrictions Weight Bearing Restrictions: No    Mobility  Bed Mobility               General bed mobility comments: up in chair on arrival  Transfers Overall transfer level: Needs assistance Equipment used: Rolling walker (2 wheeled) Transfers: Sit to/from Stand Sit to Stand: Min guard         General transfer comment: VCs for hand placement  Ambulation/Gait Ambulation/Gait assistance: Min guard Gait Distance (Feet): 60 Feet(30 feet x  2) Assistive device: Rolling walker (2 wheeled) Gait Pattern/deviations: Trunk flexed;Wide base of support;Shuffle;Step-through pattern;Decreased stride length   Gait velocity interpretation: <1.31 ft/sec, indicative of household ambulator General Gait Details: Pt was able to ambulate with RW without physical assist.  cues to stay close to RW.  Trunk flexed at baseline.  Plan for SNF to work on endurance and balance.    Stairs             Wheelchair Mobility    Modified Rankin (Stroke Patients Only)       Balance Overall balance assessment: Needs assistance Sitting-balance support: No upper extremity supported;Feet supported Sitting balance-Leahy Scale: Fair     Standing balance support: Bilateral upper extremity supported;During functional activity Standing balance-Leahy Scale: Poor Standing balance comment: relies on UE support for balance.                             Cognition Arousal/Alertness: Awake/alert Behavior During Therapy: Flat affect Overall Cognitive Status: Within Functional Limits for tasks assessed                                        Exercises General Exercises - Lower Extremity Ankle Circles/Pumps: AROM;Both;10 reps;Seated Long Arc Quad: AROM;Both;10 reps;Seated    General Comments        Pertinent Vitals/Pain Pain Assessment: No/denies pain   VSS on O2.  Home Living  Prior Function            PT Goals (current goals can now be found in the care plan section) Acute Rehab PT Goals Patient Stated Goal: to go home Progress towards PT goals: Progressing toward goals    Frequency    Min 3X/week      PT Plan Current plan remains appropriate    Co-evaluation              AM-PAC PT "6 Clicks" Daily Activity  Outcome Measure  Difficulty turning over in bed (including adjusting bedclothes, sheets and blankets)?: Unable Difficulty moving from lying on back to sitting  on the side of the bed? : Unable Difficulty sitting down on and standing up from a chair with arms (e.g., wheelchair, bedside commode, etc,.)?: A Little Help needed moving to and from a bed to chair (including a wheelchair)?: A Little Help needed walking in hospital room?: A Little Help needed climbing 3-5 steps with a railing? : A Lot 6 Click Score: 13    End of Session Equipment Utilized During Treatment: Gait belt;Oxygen Activity Tolerance: Patient limited by fatigue Patient left: in chair;with call bell/phone within reach;with chair alarm set Nurse Communication: Mobility status PT Visit Diagnosis: Unsteadiness on feet (R26.81);History of falling (Z91.81);Muscle weakness (generalized) (M62.81)     Time: 3912-2583 PT Time Calculation (min) (ACUTE ONLY): 12 min  Charges:  $Gait Training: 8-22 mins                     [facility] Acute Rehabilitation [phone]   Denice Paradise 09/11/2017, 1:05 PM

## 2017-09-11 NOTE — Care Management Note (Signed)
Case Management Note Previous CM note completed by Carles Collet, RN 09/06/2017, 11:46 AM   Patient Details  Name: Brenda Matthews MRN: [id number] Date of Birth: [birth year]  Subjective/Objective:                 Admitted w sepsis and PNA   Action/Plan:  82 year old female admitted from home alone. Patient verbalizes that she wants to go to SNF, [facility], prior to returning home. Notified Dr Broadus John and placed PT OT evals as required for potential SNF placement. Placed CSW consult as well.  Expected Discharge Date:  09/11/17               Expected Discharge Plan:  [place]  In-House Referral:  Clinical Social Work  Discharge planning Services  CM Consult  Post Acute Care Choice:  NA Choice offered to:  NA  DME Arranged:    DME Agency:     HH Arranged:    [place] Agency:     Status of Service:  Completed, signed off  If discussed at [employer], dates discussed:    Discharge Disposition: skilled facility   Additional Comments:  09/11/17- 1115- Marvetta Gibbons RN, CM- Pt stable for discharge today, CSW following for transition to SNF.  Marvetta Gibbons [place], RN 09/11/2017, 11:14 AM [phone] 4E Transition Care Coordinator

## 2017-09-11 NOTE — Progress Notes (Signed)
Telemetry and IV discontinued at this time. Patient tolerated well. [facility] here at this time to transport patient to [facility].   Emelda Fear, RN

## 2017-09-11 NOTE — Discharge Summary (Signed)
Physician Discharge Summary  Brenda Matthews JQG:[id number] DOB: [birth year] DOA: 08/29/2017  PCP: [name], MD  Admit date: 08/29/2017 Discharge date: 09/11/2017  Time spent: 45 minutes  Recommendations for Outpatient Follow-up:  1. Cardiology Dr. Debara Pickett on 9/26 2. PCP Dr. Anitra Lauth in one week, please check B met at follow-up   Discharge Diagnoses:    Acute hypoxic respiratory failure   Acute on chronic diastolic CHF   Recurrent pleural effusions   Community acquired pneumonia   Sepsis   Transient hypotension   Hyperlipidemia   Essential hypertension   Chronic renal insufficiency, stage II (mild)   PAF (paroxysmal atrial fibrillation) (HCC)   Chronic anticoagulation   Acute-on-chronic kidney injury ([place])   Rash in adult   Atrial fibrillation and flutter (HCC)   Non-ST elevation (NSTEMI) myocardial infarction [facility])   Acute respiratory failure ([place])   S/P thoracentesis   Discharge Condition: improving  Diet recommendation: low sodium heart healthy  Filed Weights   09/09/17 0500 09/10/17 0502 09/11/17 0503  Weight: 83 kg 80.6 kg 80.2 kg    History of present illness:  Brenda Matthews a very pleasant82 y.o.femalewith medical history significantfor paroxysmal A. Fib on anticoagulation, hypertension, hyperlipidemia, chronic renal insufficiency stage II, autoimmune hepatitis presents to the emergency department with the chief complaint of chest pain shortness of breath generalized weakness. Initial evaluation reveals max temp of 100.6, EKG with atrial fibrillation and heart rate of 106, acute kidney injury.   Hospital Course:   1. Acute on chronic hypoxic respiratory failure -Due to pleural effusions, which were due to diastolic CHF and pneumonia -After a long protracted course with antibiotics and diuresis, with marginal improvement -Finally underwent thoracentesis 3 days ago- bilaterally, fluid was transudative, cultures negative -Now continues to improve,  completed antibiotic course -Still volume overloaded however lungs without significant pleural effusion or edema at this point -Transitioned to oral Lasix 40 mg by mouth twice a day with potassium  2. Community acquired pneumonia -? Sepsis, had transient hypotension in the ED following admission required Neo-Synephrine for 1 night -Subsequently improved, completed antibiotic course, leukocytosis has resolved Pro calcitonin has normalized  3. Atrial fibrillation with rapid ventricular response -Atrial fibrillation was poorly controlled throughout this hospitalization -Followed by cardiology, continued on amiodarone dose was increased to 20 mg twice a day, continue xarelto -Also gradually increase metoprolol dose to 100 mg twice a day at this time -Heart rate improved now  4. Acute on chronic diastolic CHF -Echo with preserved EF -Diuretics as noted above, still has peripheral edema but lungs are clear  5. AKi on CKD2 -Improved, creatinine stable at 1.1 now   Procedures:  Bilateral thoracentesis  Consultations:  Cardiology  Pulmonary  Discharge Exam: Vitals:   09/11/17 0816 09/11/17 0900  BP: 111/80 114/81  Pulse: 93   Resp: (!) 22 (!) 23  Temp: 98 F (36.7 C)   SpO2: 94%     General: AAOx3 Cardiovascular: S1S2/irregularly irregular Respiratory: CTAB  Discharge Instructions   Discharge Instructions    Diet - low sodium heart healthy   Complete by:  As directed    Increase activity slowly   Complete by:  As directed      Allergies as of 09/11/2017      Reactions   Augmentin [amoxicillin-pot Clavulanate] Nausea And Vomiting, Other (See Comments)   "projectile vomiting" Has patient had a PCN reaction causing immediate rash, facial/tongue/throat swelling, SOB or lightheadedness with hypotension:No Has patient had a PCN reaction causing severe rash  involving mucus membranes or skin necrosis:No Has patient had a PCN reaction that required hospitalization:No Has  patient had a PCN reaction occurring within the last 10 years:Yes If all of the above answers are "NO", then may proceed with Cephalosporin use.      Medication List    STOP taking these medications   losartan 50 MG tablet Commonly known as:  COZAAR   metoprolol succinate 50 MG 24 hr tablet Commonly known as:  TOPROL-XL     TAKE these medications   acetaminophen 325 MG tablet Commonly known as:  TYLENOL Take 650 mg by mouth every 6 (six) hours as needed for moderate pain or headache.   albuterol 108 (90 Base) MCG/ACT inhaler Commonly known as:  PROVENTIL HFA;VENTOLIN HFA INHALE 2 PUFFS INTO THE LUNGS EVERY 4 HOURS IF NEEDED FOR WHEEZING OR SHORTNESS OF BREATH(COUGH SHORTNESS OF BREATH AND WHEEZING) What changed:    how much to take  how to take this  when to take this  reasons to take this  additional instructions   alendronate 70 MG tablet Commonly known as:  FOSAMAX Take 1 tablet (70 mg total) by mouth every 7 (seven) days. Take with a full glass of water on an empty stomach. What changed:  when to take this   ALPRAZolam 0.5 MG tablet Commonly known as:  XANAX TAKE 1 TABLET BY MOUTH THREE TIMES DAILY IF NEEDED FOR STRESS What changed:    how much to take  how to take this  when to take this  reasons to take this  additional instructions   amiodarone 200 MG tablet Commonly known as:  PACERONE Take 1 tablet (200 mg total) by mouth 2 (two) times daily. What changed:  when to take this   atorvastatin 40 MG tablet Commonly known as:  LIPITOR TAKE 1 TABLET BY MOUTH ONCE DAILY   calcium-vitamin D 500-200 MG-UNIT tablet Commonly known as:  OSCAL WITH D Take 1 tablet by mouth every evening.   carboxymethylcellulose 0.5 % Soln Commonly known as:  REFRESH PLUS Place 2 drops into both eyes daily.   CENTRUM SILVER ULTRA WOMENS Tabs Take 1 tablet by mouth every evening.   AIRBORNE PO Take 1 tablet by mouth daily as needed (for vacation).   Coenzyme Q10  200 MG capsule Take 200 mg by mouth every evening.   FISH OIL PO Take 2 capsules by mouth every evening.   fluticasone 50 MCG/ACT nasal spray Commonly known as:  FLONASE Place 2 sprays into both nostrils daily.   furosemide 40 MG tablet Commonly known as:  LASIX TAKE 1 TABLET BY MOUTH TWICE DAILY   metoprolol tartrate 100 MG tablet Commonly known as:  LOPRESSOR Take 1 tablet (100 mg total) by mouth 2 (two) times daily.   OSTEO BI-FLEX TRIPLE STRENGTH PO Take 1 tablet by mouth daily.   TART CHERRY ADVANCED PO Take 1 capsule by mouth daily.   Potassium Chloride ER 20 MEQ Tbcr Take 20 mEq by mouth 2 (two) times daily.   PREVAGEN 10 MG Caps Generic drug:  Apoaequorin Take 10 mg by mouth daily.   Rivaroxaban 15 MG Tabs tablet Commonly known as:  XARELTO Take 1 tablet (15 mg total) by mouth daily with supper. What changed:    medication strength  how much to take   sodium chloride 0.65 % nasal spray Commonly known as:  OCEAN Place 2 sprays into the nose daily. After shower   trolamine salicylate 10 % cream Commonly known as:  ASPERCREME Apply 1 application topically daily.   TURMERIC PO Take 1 capsule by mouth daily.      Allergies  Allergen Reactions  . Augmentin [Amoxicillin-Pot Clavulanate] Nausea And Vomiting and Other (See Comments)    "projectile vomiting" Has patient had a PCN reaction causing immediate rash, facial/tongue/throat swelling, SOB or lightheadedness with hypotension:No Has patient had a PCN reaction causing severe rash involving mucus membranes or skin necrosis:No Has patient had a PCN reaction that required hospitalization:No Has patient had a PCN reaction occurring within the last 10 years:Yes If all of the above answers are "NO", then may proceed with Cephalosporin use.     Contact information for follow-up providers    Hilty, Nadean Corwin, MD Follow up.   Specialty:  Cardiology Why:  As scheduled below 9/26 at 2:45pm Contact  information: [place] [address] [phone]        [name], MD. Schedule an appointment as soon as possible for a visit in 1 week(s).   Specialty:  Family Medicine Why:  please check Bmet at [facility] information: [address] [place] [address] [phone]            Contact information for after-discharge care    Destination    [facility] Preferred SNF .   Service:  Skilled Nursing Contact information: [place] [phone]                   The results of significant diagnostics from this hospitalization (including imaging, microbiology, ancillary and laboratory) are listed below for reference.    Significant Diagnostic Studies: Dg Chest 2 View  Result Date: 09/06/2017 CLINICAL DATA:  Acute respiratory failure. Shortness of breath and weakness. EXAM: CHEST - 2 VIEW COMPARISON:  09/04/2017 FINDINGS: Bilateral effusions persist with atelectasis in both lower lungs. No gross increase or decrease. Upper lungs remain well aerated. Chronically enlarged cardiac silhouette and aortic atherosclerosis. IMPRESSION: Bilateral effusions and dependent pulmonary atelectasis, similar to the appearance of 2 days ago. Electronically Signed   By: Nelson Chimes M.D.   On: 09/06/2017 08:04   Dg Chest 2 View  Result Date: 09/01/2017 CLINICAL DATA:  Pneumonia EXAM: CHEST - 2 VIEW COMPARISON:  08/30/2017 chest radiograph. FINDINGS: Stable cardiomediastinal silhouette with mild cardiomegaly. No pneumothorax. Small bilateral pleural effusions, increased bilaterally. No overt pulmonary edema. Patchy bibasilar lung opacities, slightly increased bilaterally. IMPRESSION: 1. Patchy bibasilar lung opacities, slightly increased bilaterally, which could represent aspiration, pneumonia and/or atelectasis. 2. Small bilateral pleural effusions, increased bilaterally. 3. Stable  cardiomegaly without overt pulmonary edema. Electronically Signed   By: Ilona Sorrel M.D.   On: 09/01/2017 09:30   Dg Chest 2 View  Result Date: 08/29/2017 CLINICAL DATA:  Chest pain and shortness of breath atrial fibrillation. EXAM: CHEST - 2 VIEW COMPARISON:  08/04/2017 and 08/01/2017 and 02/19/2016 FINDINGS: Chronic cardiomegaly.  Aortic atherosclerosis. Pulmonary vascularity is normal. No infiltrates or effusions. Slight chronic accentuation of the thoracic kyphosis. IMPRESSION: Chronic cardiomegaly.  No acute abnormalities. Aortic Atherosclerosis (ICD10-I70.0). Electronically Signed   By: Lorriane Shire M.D.   On: 08/29/2017 12:08   Ct Chest Wo Contrast  Result Date: 09/03/2017 CLINICAL DATA:  Post therapeutic collapse of lung status. EXAM: CT CHEST WITHOUT CONTRAST TECHNIQUE: Multidetector CT imaging of the chest was performed following the standard protocol without IV contrast. COMPARISON:  Chest radiograph 09/02/2017 FINDINGS: Cardiovascular: Cardiac enlargement with small pericardial effusion. Coronary artery calcifications.  Normal caliber thoracic aorta with aortic calcification. Mediastinum/Nodes: Esophagus is decompressed. Mediastinal lymph nodes are prominent without pathologic enlargement, measuring up to about 9 mm short axis dimension. Probably reactive. Lungs/Pleura: Evaluation is limited due to motion artifact. Bilateral pleural effusions with basilar atelectasis. No consolidation otherwise demonstrated in the lungs. No pneumothorax. Airways are patent. Upper Abdomen: Large low-attenuation lesion in the right lobe of the liver as not well characterized on noncontrast imaging but probably represents a cyst. Consider ultrasound for correlation if clinically indicated. Musculoskeletal: Degenerative changes in the spine. No destructive bone lesions. IMPRESSION: Cardiac enlargement with small pericardial effusion. Bilateral pleural effusions with basilar atelectasis. Large low-attenuation lesion  in the right lobe of the liver probably a cyst. This could be confirmed at ultrasound. Aortic atherosclerosis. Electronically Signed   By: Lucienne Capers M.D.   On: 09/03/2017 04:28   US Renal  Result Date: 08/30/2017 CLINICAL DATA:  Initial evaluation for acute renal injury. EXAM: RENAL / URINARY TRACT ULTRASOUND COMPLETE COMPARISON:  None. FINDINGS: Right Kidney: Length: 11.4 cm. Echogenicity within normal limits. No hydronephrosis. 1.3 x 1.0 x 1.2 cm simple cyst present at the lower pole. Left Kidney: Length: 11.6 cm. Echogenicity within normal limits. No mass or hydronephrosis visualized. Bladder: Decompressed with a Foley catheter in place. IMPRESSION: 1. No hydronephrosis. 2. 1.3 cm simple right renal cyst. 3. Foley catheter in place. Electronically Signed   By: Jeannine Boga M.D.   On: 08/30/2017 14:25   Dg Chest Port 1 View  Result Date: 09/08/2017 CLINICAL DATA:  Status post left thoracentesis EXAM: PORTABLE CHEST 1 VIEW COMPARISON:  09/08/2017 FINDINGS: Cardiac shadow remains enlarged. Aortic calcifications are again seen. Significant reduction in left-sided pleural effusion is noted following thoracentesis. No pneumothorax is noted. Right lung remains well aerated following previous right thoracentesis. No bony abnormality is noted. IMPRESSION: No pneumothorax following left-sided thoracentesis. Electronically Signed   By: Inez Catalina M.D.   On: 09/08/2017 14:16   Dg Chest Port 1 View  Result Date: 09/08/2017 CLINICAL DATA:  Shortness of breath.  Status post thoracentesis EXAM: PORTABLE CHEST 1 VIEW COMPARISON:  September 06, 2017 FINDINGS: No evident pneumothorax. There is left lower lobe consolidation with left pleural effusion. There has been resolution of pleural effusion on the right. The right lung is clear. There is stable cardiomegaly with pulmonary vascularity normal. No adenopathy. There is aortic atherosclerosis. No bone lesions. IMPRESSION: Resolution of right pleural  effusion. No pneumothorax. There is a left pleural effusion with left lower lobe consolidation, increased from 2 days prior. Stable cardiomegaly. There is aortic atherosclerosis. No evident adenopathy. Aortic Atherosclerosis (ICD10-I70.0). Electronically Signed   By: Lowella Grip III M.D.   On: 09/08/2017 13:02   Dg Chest Port 1 View  Result Date: 09/04/2017 CLINICAL DATA:  Shortness of breath EXAM: PORTABLE CHEST 1 VIEW COMPARISON:  09/02/2017 FINDINGS: Cardiac enlargement without pulmonary vascular congestion. Bilateral pleural effusions with basilar atelectasis or infiltration, increasing since prior study. No pneumothorax. Calcification of aorta. IMPRESSION: Increasing bilateral pleural effusions and basilar atelectasis/ infiltration. Cardiac enlargement. Electronically Signed   By: Lucienne Capers M.D.   On: 09/04/2017 05:54   Dg Chest Port 1 View  Result Date: 09/02/2017 CLINICAL DATA:  Hypoxia. EXAM: PORTABLE CHEST 1 VIEW COMPARISON:  Chest x-ray from yesterday. FINDINGS: Stable cardiomegaly. Normal pulmonary vascularity. Small bilateral pleural effusions and patchy bibasilar airspace disease are similar to prior study. No pneumothorax. No acute osseous abnormality. IMPRESSION: 1. Unchanged small bilateral pleural effusions and patchy bibasilar airspace  disease. Electronically Signed   By: Titus Dubin M.D.   On: 09/02/2017 08:22   Dg Chest Port 1 View  Result Date: 08/30/2017 CLINICAL DATA:  Shortness of breath beginning today. EXAM: PORTABLE CHEST 1 VIEW COMPARISON:  08/29/2017.  08/04/2017. FINDINGS: Chronic cardiomegaly and aortic atherosclerosis. Mild chronic volume loss at the right lung base. Worsened appearance of the left lung base with increased volume loss and/or pneumonia. Probable associated effusion. Upper lungs are clear. IMPRESSION: Chronic cardiomegaly and aortic atherosclerosis. Worsening of abnormal density at the left lung base consistent with atelectasis and or  pneumonia, probably with an associated effusion. Mild chronic volume loss at the right base. Electronically Signed   By: Nelson Chimes M.D.   On: 08/30/2017 08:31    Microbiology: Recent Results (from the past 240 hour(s))  Gram stain     Status: None   Collection Time: 09/08/17  1:48 PM  Result Value Ref Range Status   Specimen Description THORACENTESIS PLEURAL  Final   Special Requests NONE  Final   Gram Stain   Final    NO WBC SEEN NO ORGANISMS SEEN Performed at [facility], [address] [place], [place] [address]    Report Status 09/08/2017 FINAL  Final  Culture, body fluid-bottle     Status: None (Preliminary result)   Collection Time: 09/08/17  1:48 PM  Result Value Ref Range Status   Specimen Description PLEURAL RIGHT  Final   Special Requests NONE  Final   Culture   Final    NO GROWTH 3 DAYS Performed at [facility] [address] [place], [place] [address]    Report Status PENDING  Incomplete  Body fluid culture     Status: None (Preliminary result)   Collection Time: 09/08/17  1:53 PM  Result Value Ref Range Status   Specimen Description THORACENTESIS  Final   Special Requests Normal  Final   Gram Stain   Final    CYTOSPIN SMEAR WBC PRESENT,BOTH PMN AND MONONUCLEAR NO ORGANISMS SEEN    Culture   Final    NO GROWTH 3 DAYS Performed at [facility], [address] [place], [place] [address]    Report Status PENDING  Incomplete     Labs: Basic Metabolic Panel: Recent Labs  Lab 09/06/17 0322 09/07/17 0322 09/08/17 0328 09/09/17 0757 09/10/17 0453  NA 139 143 141 140 141  K 4.5 4.0 3.9 3.9 3.6  CL 108 108 105 101 100  CO2 20* _0 GLUCOSE 99 123* 124* 107* 101*  BUN 29* 30* 29* 28* 28*  CREATININE 1.35* 1.30* 1.21* 1.16* 1.14*  CALCIUM 8.5* 8.7* 9.0 9.0 8.7*   Liver Function Tests: Recent Labs  Lab 09/08/17 1209  PROT 7.0   No results for input(s): LIPASE, AMYLASE in the last 168 hours. No results for input(s):  AMMONIA in the last 168 hours. CBC: Recent Labs  Lab 09/06/17 0322 09/07/17 0322 09/08/17 0328 09/09/17 0757 09/10/17 0453  WBC 7.5 8.4 7.9 8.3 7.7  HGB 12.1 11.7* 11.7* 11.9* 11.9*  HCT 38.5 39.0 38.8 39.4 38.9  MCV 93.0 95.1 95.3 94.3 93.3  PLT 307 300 285 315 299   Cardiac Enzymes: No results for input(s): CKTOTAL, CKMB, CKMBINDEX, TROPONINI in the last 168 hours. BNP: BNP (last 3 results) Recent Labs    08/29/17 2210 09/02/17 0336 09/06/17 0322  BNP 1,028.9* 1,314.7* 654.9*    ProBNP (last 3 results) Recent Labs  08/01/17 1017 08/04/17 1447  PROBNP 631.0* 574.0*    CBG: No results for input(s): GLUCAP in the last 168 hours.     Signed:  Domenic Polite MD.  [facility] 09/11/2017, 11:11 AM

## 2017-09-11 NOTE — Progress Notes (Addendum)
Attempted to call report to [facility]. Nurse was unavailable to take report. Left this nurses name and number for return phone call.   Emelda Fear, RN

## 2017-09-11 NOTE — Progress Notes (Addendum)
Report given to [place], [place] at [facility].

## 2017-09-11 NOTE — Progress Notes (Addendum)
PULMONARY / CRITICAL CARE MEDICINE   Name: Brenda Matthews MRN: [id number] DOB: 1935/03/15    ADMISSION DATE:  08/29/2017   CONSULTATION DATE: 09/04/17  REFERRING MD:   Rudolpho Sevin MD  CHIEF COMPLAINT: Bilateral pleural effusion  HISTORY OF PRESENT ILLNESS:   82 year old with history of atrial fibrillation, CHF, hypertension, hyperlipidemia, chronic kidney disease, autoimmune hepatitis Admitted on 8/13 with chest pain, dyspnea, rapid atrial fibrillation, AKI, sepsis.  She was in the ICU for 1 day for hypotension requiring pressors briefly.  Treated for pneumonia with antibiotics Also seen by cardiology for atrial fibrillation.  Cardiac catheterization on 8/16 showed no significant coronary artery disease and upper normal left ventricular filling pressure. PCCM was consulted on 8/19 for dyspnea, hypoxia and chest x-ray showing bilateral effusions.  SUBJECTIVE:  S/P B/L thoracentesis on Friday States that breathing is improved.  No acute new complaints today.  VITAL SIGNS: BP 111/80 (BP Location: Right Arm)   Pulse 93   Temp 98 F (36.7 C) (Oral)   Resp (!) 22   Ht 5' 2"  (1.575 m)   Wt 80.2 kg   LMP  (LMP Unknown)   SpO2 94%   BMI 32.34 kg/m   HEMODYNAMICS:    VENTILATOR SETTINGS:    INTAKE / OUTPUT: I/O last 3 completed shifts: In: 750 [P.O.:750] Out: 2250 [Urine:2250]  PHYSICAL EXAMINATION: Gen:      No acute distress HEENT:  EOMI, sclera anicteric Neck:     No masses; no thyromegaly Lungs:    Clear to auscultation bilaterally; normal respiratory effort CV:         Regular rate and rhythm; no murmurs Abd:      + bowel sounds; soft, non-tender; no palpable masses, no distension Ext:    No edema; adequate peripheral perfusion Skin:      Warm and dry; no rash Neuro: alert and oriented x 3 Psych: normal mood and affect  LABS:  BMET Recent Labs  Lab 09/08/17 0328 09/09/17 0757 09/10/17 0453  NA 141 140 141  K 3.9 3.9 3.6  CL 105 101 100  CO2 30 31 28   BUN  29* 28* 28*  CREATININE 1.21* 1.16* 1.14*  GLUCOSE 124* 107* 101*    Electrolytes Recent Labs  Lab 09/08/17 0328 09/09/17 0757 09/10/17 0453  CALCIUM 9.0 9.0 8.7*    CBC Recent Labs  Lab 09/08/17 0328 09/09/17 0757 09/10/17 0453  WBC 7.9 8.3 7.7  HGB 11.7* 11.9* 11.9*  HCT 38.8 39.4 38.9  PLT 285 315 299    Coag's No results for input(s): APTT, INR in the last 168 hours.  Sepsis Markers Recent Labs  Lab 09/04/17 1859 09/05/17 0338 09/06/17 0933  PROCALCITON 0.13 <0.10 <0.10    ABG No results for input(s): PHART, PCO2ART, PO2ART in the last 168 hours.  Liver Enzymes No results for input(s): AST, ALT, ALKPHOS, BILITOT, ALBUMIN in the last 168 hours.  Cardiac Enzymes No results for input(s): TROPONINI, PROBNP in the last 168 hours.  Glucose No results for input(s): GLUCAP in the last 168 hours.  Imaging No results found.Chest x-ray 8/21 reveals bilateral effusions and edema, reviewed by S. Minor   STUDIES:  CT chest 09/03/2017-bilateral effusion with atelectasis.  No obvious consolidation.  Cardiac enlargement with small pericardial effusion.  I have reviewed the images personally.  Chest x-ray 8/23- no pneumothorax post thoracentesis.  I have reviewed the images personally.  CULTURES: 8/13 RVP >> neg 8/14 MRSA PCR >> neg 8/14 BCx 2>> No Growth 8/14  Urine Streptococcus, Legionella >> negative  ANTIBIOTICS: ceftriaxone 8/13 >> 8/22 8/14 doxycycline >> 8/13 Azithromycin 8/19 >> 8/22  SIGNIFICANT EVENTS: 09/04/2017 PCCM recalled for worsening respiratory status 8/23 > B/L Thoracentesis  LINES/TUBES:  DISCUSSION: 82 year old with respiratory failure, pleural effusions in the setting of pneumonia, atrial fibrillation, CHF PCCM called back for intermittent periods of hypoxia, bilateral effusion.  Respiratory failure, bilateral effusion She has received adequate treatment for pneumonia.  Procalcitonin is negative Continue diuresis as allowed by  renal function Breathing is improved post thoracentesis.  Follow final pleural fluid cultures and cytology  PCCM will sign off.  Please call back if there are any questions.  Marshell Garfinkel MD [facility] 09/11/2017, 9:30 AM

## 2017-09-11 NOTE — Clinical Social Work Placement (Signed)
Nurse to call report to [phone], Room 3209     CLINICAL SOCIAL WORK PLACEMENT  NOTE  Date:  09/11/2017  Patient Details  Name: Brenda Matthews MRN: [id number] Date of Birth: January 08, 1936  Clinical Social Work is seeking post-discharge placement for this patient at the [place] level of care (*CSW will initial, date and re-position this form in  chart as items are completed):  Yes   Patient/family provided with [place] Work Department's list of facilities offering this level of care within the geographic area requested by the patient (or if unable, by the patient's family).  Yes   Patient/family informed of their freedom to choose among providers that offer the needed level of care, that participate in Medicare, Medicaid or managed care program needed by the patient, have an available bed and are willing to accept the patient.  Yes   Patient/family informed of [facility]'s ownership interest in [facility] and [facility], as well as of the fact that they are under no obligation to receive care at these facilities.  PASRR submitted to EDS on 09/07/17     PASRR number received on       Existing PASRR number confirmed on 09/07/17     FL2 transmitted to all facilities in geographic area requested by pt/family on 09/07/17     FL2 transmitted to all facilities within larger geographic area on       Patient informed that his/her managed care company has contracts with or will negotiate with certain facilities, including the following:        Yes   Patient/family informed of bed offers received.  Patient chooses bed at [facility]     Physician recommends and patient chooses bed at      Patient to be transferred to [facility] on 09/11/17.  Patient to be transferred to facility by Family car     Patient family notified on 09/11/17 of transfer.  Name of family member notified:        PHYSICIAN        Additional Comment:    _______________________________________________ Geralynn Ochs, LCSW 09/11/2017, 2:19 PM

## 2017-09-11 NOTE — Discharge Instructions (Signed)

## 2017-09-12 ENCOUNTER — Encounter: Payer: Self-pay | Admitting: Family Medicine

## 2017-09-12 ENCOUNTER — Telehealth: Payer: Self-pay

## 2017-09-12 LAB — PATHOLOGIST SMEAR REVIEW

## 2017-09-12 NOTE — Telephone Encounter (Signed)
LM requesting call back to complete TCM and schedule hospital follow up.   

## 2017-09-13 LAB — CULTURE, BODY FLUID W GRAM STAIN -BOTTLE: Culture: NO GROWTH

## 2017-09-13 LAB — CHOLESTEROL, BODY FLUID
CHOL FL: 31 mg/dL
CHOL FL: 41 mg/dL

## 2017-09-13 LAB — CULTURE, BODY FLUID-BOTTLE

## 2017-09-13 NOTE — Telephone Encounter (Signed)
LM requesting call back to complete TCM and schedule hospital follow up.   

## 2017-09-14 NOTE — Telephone Encounter (Signed)
Spoke with patient regarding hospital visit/discharge. Patient currently staying in SNF for rehab x 20 days. Patient states she is feeling much better and plans to have the providers at the facility complete recommended discharge labs. Pt will call for f/u appt with PCP once discharged from SNF.

## 2017-09-14 NOTE — Telephone Encounter (Signed)
Noted  

## 2017-10-03 ENCOUNTER — Other Ambulatory Visit: Payer: Self-pay | Admitting: Family Medicine

## 2017-10-04 ENCOUNTER — Ambulatory Visit: Payer: Medicare Other | Admitting: Family Medicine

## 2017-10-04 ENCOUNTER — Encounter: Payer: Self-pay | Admitting: Family Medicine

## 2017-10-04 VITALS — BP 137/77 | HR 62 | Temp 98.7°F | Resp 16 | Ht 62.0 in | Wt 169.2 lb

## 2017-10-04 DIAGNOSIS — N179 Acute kidney failure, unspecified: Secondary | ICD-10-CM

## 2017-10-04 DIAGNOSIS — Z23 Encounter for immunization: Secondary | ICD-10-CM | POA: Diagnosis not present

## 2017-10-04 DIAGNOSIS — I5031 Acute diastolic (congestive) heart failure: Secondary | ICD-10-CM

## 2017-10-04 DIAGNOSIS — N2889 Other specified disorders of kidney and ureter: Secondary | ICD-10-CM

## 2017-10-04 DIAGNOSIS — N182 Chronic kidney disease, stage 2 (mild): Secondary | ICD-10-CM

## 2017-10-04 DIAGNOSIS — J9601 Acute respiratory failure with hypoxia: Secondary | ICD-10-CM | POA: Diagnosis not present

## 2017-10-04 DIAGNOSIS — I4891 Unspecified atrial fibrillation: Secondary | ICD-10-CM | POA: Diagnosis not present

## 2017-10-04 LAB — BASIC METABOLIC PANEL
BUN: 37 mg/dL — ABNORMAL HIGH (ref 6–23)
CALCIUM: 10 mg/dL (ref 8.4–10.5)
CO2: 32 meq/L (ref 19–32)
Chloride: 101 mEq/L (ref 96–112)
Creatinine, Ser: 1.75 mg/dL — ABNORMAL HIGH (ref 0.40–1.20)
GFR: 29.68 mL/min — ABNORMAL LOW (ref >60.00)
Glucose, Bld: 91 mg/dL (ref 70–99)
Potassium: 4.4 mEq/L (ref 3.5–5.1)
Sodium: 141 mEq/L (ref 135–145)

## 2017-10-04 NOTE — Progress Notes (Signed)
10/04/2017  CC:  Chief Complaint  Patient presents with  . Hospitalization Follow-up    Patient is a 82 y.o. Caucasian female who presents accompanied by her niece for  hospital follow up. Dates hospitalized: 8/13-8/26, 2019. Days since d/c from hospital: 23 days Patient was discharged from hospital to [facility] rehab x 14d, then went home. Reason for admission to hospital: resp distress.  Discharge Diagnoses:    Acute hypoxic respiratory failure   Acute on chronic diastolic CHF   Recurrent pleural effusions   Community acquired pneumonia   Sepsis   Transient hypotension   Hyperlipidemia   Essential hypertension   Chronic renal insufficiency, stage II (mild)   PAF (paroxysmal atrial fibrillation) (HCC)   Chronic anticoagulation   Acute-on-chronic kidney injury ([place])   Rash in adult   Atrial fibrillation and flutter (HCC)   Non-ST elevation (NSTEMI) myocardial infarction ([place])   Acute respiratory failure (HCC)   S/P thoracentesis   I have reviewed patient's discharge summary plus pertinent specific notes, labs, and imaging from the hospitalization.   Acute hypox resp failure-->pneumonia and diastolic CFH + pleural effusions.  Abx-->diuresis-->thoracentesis (transudate)-->transitioned to lasix.  Weened off oxygen.   Initially with AKI but normalized in hosp.  NSTEMI-->cath showed no angiographically signif CAD, ECHO normal wall motion and EF. Her rapid A fib was a problem in hosp: her amiodarone was increased to 200 mg bid.  She was kept on xarelto.  This calmed down appropriately.  She is now back home.  Preparing to move to [place].  Niece staying with her 4 hours per day. She states she feels much better except for fatigue that is slow to improve.  No unusual SOB, no CP, no dizziness, says LE swelling is much better.  Wearing compression stockings regularly and adhering to low Na diet. Drinking fluids fine, eating fine.    ROS: no CP, no SOB, no wheezing, no  cough, no dizziness, no HAs, no rashes, no melena/hematochezia.  No polyuria or polydipsia.  No myalgias or arthralgias.     Medication reconciliation was done today and patient is taking meds as recommended by discharging hospitalist/specialist.    PMH:  Past Medical History:  Diagnosis Date  . Abnormal EKG approx 2008   Nuclear stress test neg;   . Anxiety    with panic  . Arthritis   . Autoimmune hepatitis ([place])   . CAP (community acquired pneumonia) 08/2017   Hospitalization for CAP/acute diast HF/rapid a-fib  . Cataract    s/p surgery--lens implants  . Chronic diastolic heart failure ([place]) 2019  . Chronic renal insufficiency, stage II (mild) 12/04/2012   GFR 60s.  Renal u/s when in hosp 08/2017 for CAP/CHF showed symmetric kidneys, echogenicity normal, w/out hydronephrosis.  . Cirrhosis ([place])   . Diverticulosis 2009  . Fracture of radial shaft, left, closed 11/16/10   fell down flight of stairs  . History of kidney stones   . Hx of adenomatous colonic polyps 2002   surveillance colonoscopy 2009, +polypectomy done-tubular adenoma w/out high grade.  05/2013 tubular adenomas--recall 3 yrs  . Hyperlipidemia   . Hypertension   . Low TSH level 02/18/2016   T3 norm, T4 mildly elevated--suspected sick euthyroid syndrome.  Repeat labs 06/2016: normal.  . NASH (nonalcoholic steatohepatitis)   . Osteopenia    DEXA 08/2010; repeat DEXA 02/2015 worse: fosamax started  . PAF (paroxysmal atrial fibrillation) ([place]) 02/2016   when in post-op for ankle surgery; spontaneously converted in hosp, seen by Dr. Debara Pickett  in consultation--metoprolol rate control + xarelto recommended.  Metop d/c due to hypot.  Plan to cont xarelto 20 mg qd indef due to CHAD-VASc score of 3.  A-fib w/RVR and CHF 07/2017; pt placed on amiodarone and plan for CV, but pt was in sinus rhythm when she went in for her DC CV, so she was sent home.  . Peripheral edema   . Pneumonia 2015   hx  . Rheumatic fever     PSH:  Past  Surgical History:  Procedure Laterality Date  . APPENDECTOMY  1966   done during surgery for tubal pregnancy  . CATARACT EXTRACTION W/ INTRAOCULAR LENS IMPLANT  2013   bilat  . COLONOSCOPY W/ POLYPECTOMY  05/2013   +diverticulosis; recall 3 yrs (Dr. Henrene Pastor)  . DEXA  02/2015   T score -2.1 in both femoral necks; FRAX 10 yr risk of major osteoporotic fracture was 21%---fosamax started  . EYE SURGERY    . LEFT HEART CATH AND CORONARY ANGIOGRAPHY N/A 09/01/2017   No angiographically significant CAD.  Upper normal left ventricular filling pressure.  Procedure: LEFT HEART CATH AND CORONARY ANGIOGRAPHY;  Surgeon: Nelva Bush, MD;  Location: [facility];  Service: Cardiovascular;  Laterality: N/A;  . OPEN REDUCTION INTERNAL FIXATION (ORIF) TIBIA/FIBULA FRACTURE Left 02/18/2016   Procedure: OPEN REDUCTION INTERNAL FIXATION (ORIF) Right ankle trimalleolar fracture;  Surgeon: Wylene Simmer, MD;  Location: [facility];  Service: Orthopedics;  Laterality: Left;  requests 41mns  . ORIF RADIAL FRACTURE  11/18/10   left; s/p slip on slippery floor and fell  . THORACENTESIS  08/2017   diagnostic and therapeutic.  Transudative.  Clx neg.  (+pulm edema/diastolic HF)  . TONSILLECTOMY    . TRANSTHORACIC ECHOCARDIOGRAM  02/18/2016; 08/10/17   LVEF of 55-60%, mild AI and mild MR and normal biatrial size.  07/2017--normal LV function, mild enlarge aortic root, mild/mod TR, bilat atrial enlargement.    MEDS:  Outpatient Medications Prior to Visit  Medication Sig Dispense Refill  . acetaminophen (TYLENOL) 325 MG tablet Take 650 mg by mouth every 6 (six) hours as needed for moderate pain or headache.     . albuterol (PROAIR HFA) 108 (90 Base) MCG/ACT inhaler INHALE 2 PUFFS INTO THE LUNGS EVERY 4 HOURS IF NEEDED FOR WHEEZING OR SHORTNESS OF BREATH(COUGH SHORTNESS OF BREATH AND WHEEZING) (Patient taking differently: Inhale 2 puffs into the lungs every 4 (four) hours as needed for wheezing or shortness of breath. ) 8.5 g  1  . alendronate (FOSAMAX) 70 MG tablet Take 1 tablet (70 mg total) by mouth every 7 (seven) days. Take with a full glass of water on an empty stomach. (Patient taking differently: Take 70 mg by mouth every Monday. Take with a full glass of water on an empty stomach.) 4 tablet 11  . ALPRAZolam (XANAX) 0.5 MG tablet TAKE 1 TABLET BY MOUTH THREE TIMES DAILY IF NEEDED FOR STRESS (Patient taking differently: Take 0.5 mg by mouth 3 (three) times daily as needed for anxiety. ) 90 tablet 5  . amiodarone (PACERONE) 200 MG tablet Take 1 tablet (200 mg total) by mouth 2 (two) times daily.    .Marland Kitchenatorvastatin (LIPITOR) 40 MG tablet TAKE 1 TABLET BY MOUTH ONCE DAILY 90 tablet 1  . calcium-vitamin D (OSCAL WITH D) 500-200 MG-UNIT per tablet Take 1 tablet by mouth every evening.     . carboxymethylcellulose (REFRESH PLUS) 0.5 % SOLN Place 2 drops into both eyes daily.    . Coenzyme Q10 200  MG capsule Take 200 mg by mouth every evening.     . fluticasone (FLONASE) 50 MCG/ACT nasal spray Place 2 sprays into both nostrils daily.     . furosemide (LASIX) 40 MG tablet TAKE 1 TABLET BY MOUTH TWICE DAILY 180 tablet 1  . metoprolol tartrate (LOPRESSOR) 100 MG tablet Take 1 tablet (100 mg total) by mouth 2 (two) times daily.  0  . Misc Natural Products (OSTEO BI-FLEX TRIPLE STRENGTH PO) Take 1 tablet by mouth daily.    . Misc Natural Products (TART CHERRY ADVANCED PO) Take 1 capsule by mouth daily.    . Multiple Vitamins-Minerals (AIRBORNE PO) Take 1 tablet by mouth daily as needed (for vacation).     . Multiple Vitamins-Minerals (CENTRUM SILVER ULTRA WOMENS) TABS Take 1 tablet by mouth every evening.     . Omega-3 Fatty Acids (FISH OIL PO) Take 2 capsules by mouth every evening.     . potassium chloride 20 MEQ TBCR Take 20 mEq by mouth 2 (two) times daily.    . rivaroxaban (XARELTO) 15 MG TABS tablet Take 1 tablet (15 mg total) by mouth daily with supper.    . sodium chloride (OCEAN) 0.65 % nasal spray Place 2 sprays into  the nose daily. After shower    . trolamine salicylate (ASPERCREME) 10 % cream Apply 1 application topically daily.    . TURMERIC PO Take 1 capsule by mouth daily.    Marland Kitchen Apoaequorin (PREVAGEN) 10 MG CAPS Take 10 mg by mouth daily.      No facility-administered medications prior to visit.   EXAM: BP 137/77 (BP Location: Left Arm, Patient Position: Sitting, Cuff Size: Normal)   Pulse 62   Temp 98.7 F (37.1 C) (Oral)   Resp 16   Ht 5' 2"  (1.575 m)   Wt 169 lb 4 oz (76.8 kg)   LMP  (LMP Unknown)   SpO2 96%   BMI 30.96 kg/m   Gen: Alert, well appearing.  Patient is oriented to person, place, time, and situation. AFFECT: pleasant, lucid thought and speech. ALP:FXTK: no injection, icteris, swelling, or exudate.  EOMI, PERRLA. Mouth: lips without lesion/swelling.  Oral mucosa pink and moist. Oropharynx without erythema, exudate, or swelling.  CV: irreg irreg, no rub. Chest is clear, no wheezing or rales. Normal symmetric air entry throughout both lung fields. No chest wall deformities or tenderness.  No e to a changes. EXT: no clubbing or cyanosis.  2+ edema.     Pertinent labs/imaging  Lab Results  Component Value Date   TROPONINI 0.11 ([place]) 08/31/2017    Lab Results  Component Value Date   TSH 0.650 08/10/2017   Lab Results  Component Value Date   WBC 7.7 09/10/2017   HGB 11.9 (L) 09/10/2017   HCT 38.9 09/10/2017   MCV 93.3 09/10/2017   PLT 299 09/10/2017   Lab Results  Component Value Date   CREATININE 1.14 (H) 09/10/2017   BUN 28 (H) 09/10/2017   NA 141 09/10/2017   K 3.6 09/10/2017   CL 100 09/10/2017   CO2 28 09/10/2017   Lab Results  Component Value Date   ALT 26 08/29/2017   AST 20 08/29/2017   ALKPHOS 53 08/29/2017   BILITOT 1.0 08/29/2017   Lab Results  Component Value Date   CHOL 100 09/08/2017   Lab Results  Component Value Date   HDL 47.30 07/27/2017   Lab Results  Component Value Date   LDLCALC 50 07/27/2017   Lab Results  Component  Value Date   TRIG 160.0 (H) 07/27/2017   Lab Results  Component Value Date   CHOLHDL 3 07/27/2017    ASSESSMENT/PLAN:  Hosp f/u:  1) Acute hypoxic resp failure, multifactorial: CAP, acute diastolic CHF. She is s/p approp abx, taking lasix 24m bid and controlling HR and bp with lopressor at max dose and she is also on supplemental potassium now.  2) Diastolic CHF: echo with some increased diast filling pressures, pulm edema and LE edema on exam in hosp. Her peripheral edema is pretty minimal here today. Thoracentesis was ultimately needed and helped significantly, now on 429mbid lasix and metoprolol 100 mg bid. Low Na diet.  Compression stockings.  Has f/u with cardiologist on 10/12/17.  3) A-fib w/RVR: they got this under control in hosp:  Amiodarone increased to 200 mg bid. Lopressor as stated above. Xarelto 1555md, no sign of bleeding. She is essentially asymptomatic regarding this problem at this time. Check lytes/cr today.  4) CRI II: avoid NSAIDs, hydrate adequately.  Acute kidney injury noted while in hosp-->back to baseline prior to d/c. Lytes/cr recheck today.  Flu vaccine given in office today.  Spent 40 min with pt today, with >50% of this time spent in counseling and care coordination regarding the above problems.  An After Visit Summary was printed and given to the patient.  FOLLOW UP:  3 mo  Signed:  PhiCrissie SicklesD           10/04/2017

## 2017-10-06 ENCOUNTER — Telehealth: Payer: Self-pay | Admitting: Family Medicine

## 2017-10-06 NOTE — Telephone Encounter (Signed)
Please advise. Thanks.  

## 2017-10-06 NOTE — Telephone Encounter (Signed)
Copied from [place] [id number]. Topic: Quick Communication - See Telephone Encounter >> Oct 06, 2017 12:31 PM [place], Valda Favia wrote: Lenna Sciara from [facility] is calling in requesting verbal orders for a start of care date to be 10/10/2017  Cb# [id number]

## 2017-10-06 NOTE — Telephone Encounter (Signed)
Yes, this is ok.

## 2017-10-06 NOTE — Telephone Encounter (Signed)
Melissa was advised and voiced understanding.

## 2017-10-11 ENCOUNTER — Telehealth: Payer: Self-pay | Admitting: Family Medicine

## 2017-10-11 ENCOUNTER — Telehealth: Payer: Self-pay | Admitting: *Deleted

## 2017-10-11 NOTE — Telephone Encounter (Signed)
Yes, this is fine.

## 2017-10-11 NOTE — Telephone Encounter (Signed)
Please advise. Thanks.  

## 2017-10-11 NOTE — Telephone Encounter (Signed)
Copied from [place] [id number]. Topic: Quick Communication - See Telephone Encounter >> Oct 10, 2017  2:48 PM Hewitt Shorts wrote: April with [facility] is calling to get a verbal order skilled nursing 2times a week a t 3 weeks 1 time a week for two   Nest number [phone]

## 2017-10-11 NOTE — Telephone Encounter (Unsigned)
Copied from [place] [id number]. Topic: Quick Communication - See Telephone Encounter >> Oct 11, 2017  4:44 PM Percell Belt A wrote: CRM for notification. See Telephone encounter for: 10/11/17.  Clair Gulling with [facility] [phone] Need verbal for  OT  1 week 1 2 week 1 1 week 1

## 2017-10-11 NOTE — Telephone Encounter (Signed)
April advised and voiced understanding.

## 2017-10-12 ENCOUNTER — Ambulatory Visit: Payer: Medicare Other | Admitting: Internal Medicine

## 2017-10-12 ENCOUNTER — Encounter: Payer: Self-pay | Admitting: Internal Medicine

## 2017-10-12 VITALS — BP 157/80 | HR 58 | Ht 60.0 in | Wt 169.0 lb

## 2017-10-12 DIAGNOSIS — I480 Paroxysmal atrial fibrillation: Secondary | ICD-10-CM

## 2017-10-12 DIAGNOSIS — Z79899 Other long term (current) drug therapy: Secondary | ICD-10-CM

## 2017-10-12 DIAGNOSIS — I10 Essential (primary) hypertension: Secondary | ICD-10-CM

## 2017-10-12 DIAGNOSIS — I4819 Other persistent atrial fibrillation: Secondary | ICD-10-CM | POA: Insufficient documentation

## 2017-10-12 NOTE — Progress Notes (Signed)
OFFICE NOTE  Chief Complaint:  Routine follow-up  Primary Care Physician: [name], MD  HPI:  Brenda Matthews is a 82 y.o. 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 After induction with anesthesia today, she was noted to go into a-fib with RVR which persists. She has no awareness of this. Risk factors include age, HTN and dyslipidemia. Mom had several strokes, but no known a-fib. Exam benign except irregularly irregular rhythm, post-operative ankle. Apparently had a stress test in 2008 which was negative. She was placed on Xarelto and a low-dose beta blocker. Unfortunately blood pressure was low and she was taken off of her medication. She spontaneously converted while in the hospital and I encouraged her to stay on the [place]. She returns today for follow-up. She reports that she is very anxious to get out of her cast. Hopefully soon she will be in a walking boot. She denies any bleeding problems on the Xarelto. She does not report any recurrent atrial fibrillation.  12/16/2016  Brenda Matthews returns today for follow-up.  She denies any recurrent atrial fibrillation.  Since I last saw her she is done fairly well.  Blood pressure is normal today 124/66.  EKG shows sinus rhythm at 67, personally reviewed.  She remains anticoagulated for paroxysmal atrial fibrillation and chads vas score of 3.  She was started on anticoagulation while hospitalized by the orthopedist and was switched from the orthopedic dose of Xarelto to 15 mg daily.  I reviewed lab work, specifically, her renal function which is normal, and advised her that she should be on the full 20 mg dose of Xarelto.  10/12/2017  Brenda Matthews is seen today in follow-up.  Overall she is doing well.  She denies any recurrent A. fib.  Recently she saw her PCP was noted to have an elevated creatinine up to 1.7 from 1.1.  He decreased her Lasix  from 80 to 40 mg daily.  She had previously been on 20 mg of Xarelto however given her acute kidney injury and GFR less than 35 the dose was decreased to 15 mg.  She is on amiodarone 400 mg daily.  PMHx:  Past Medical History:  Diagnosis Date  . Abnormal EKG approx 2008   Nuclear stress test neg;   . Anxiety    with panic  . Arthritis   . Autoimmune hepatitis ([place])   . CAP (community acquired pneumonia) 08/2017   Hospitalization for CAP/acute diast HF/rapid a-fib  . Cataract    s/p surgery--lens implants  . Chronic diastolic heart failure ([place]) 2019  . Chronic renal insufficiency, stage II (mild) 12/04/2012   GFR 60s.  Renal u/s when in hosp 08/2017 for CAP/CHF showed symmetric kidneys, echogenicity normal, w/out hydronephrosis.  . Cirrhosis ([place])   . Diverticulosis 2009  . Fracture of radial shaft, left, closed 11/16/10   fell down flight of stairs  . History of kidney stones   . Hx of adenomatous colonic polyps 2002   surveillance colonoscopy 2009, +polypectomy done-tubular adenoma w/out high grade.  05/2013 tubular adenomas--recall 3 yrs  . Hyperlipidemia   . Hypertension   . Low TSH level 02/18/2016   T3 norm, T4 mildly elevated--suspected sick euthyroid syndrome.  Repeat labs 06/2016: normal.  . NASH (nonalcoholic steatohepatitis)   . Osteopenia    DEXA 08/2010; repeat DEXA 02/2015 worse: fosamax started  . PAF (paroxysmal atrial fibrillation) ([place]) 02/2016  when in post-op for ankle surgery; spontaneously converted in hosp, seen by Dr. Debara Pickett in consultation--metoprolol rate control + xarelto recommended.  Metop d/c due to hypot.  Plan to cont xarelto 20 mg qd indef due to CHAD-VASc score of 3.  A-fib w/RVR and CHF 07/2017; pt placed on amiodarone and plan for CV, but pt was in sinus rhythm when she went in for her DC CV, so she was sent home.  . Peripheral edema   . Pneumonia 2015   hx  . Rheumatic fever     Past Surgical History:  Procedure Laterality Date  . APPENDECTOMY   1966   done during surgery for tubal pregnancy  . CATARACT EXTRACTION W/ INTRAOCULAR LENS IMPLANT  2013   bilat  . COLONOSCOPY W/ POLYPECTOMY  05/2013   +diverticulosis; recall 3 yrs (Dr. Henrene Pastor)  . DEXA  02/2015   T score -2.1 in both femoral necks; FRAX 10 yr risk of major osteoporotic fracture was 21%---fosamax started  . EYE SURGERY    . LEFT HEART CATH AND CORONARY ANGIOGRAPHY N/A 09/01/2017   No angiographically significant CAD.  Upper normal left ventricular filling pressure.  Procedure: LEFT HEART CATH AND CORONARY ANGIOGRAPHY;  Surgeon: Nelva Bush, MD;  Location: [place] [facility];  Service: Cardiovascular;  Laterality: N/A;  . OPEN REDUCTION INTERNAL FIXATION (ORIF) TIBIA/FIBULA FRACTURE Left 02/18/2016   Procedure: OPEN REDUCTION INTERNAL FIXATION (ORIF) Right ankle trimalleolar fracture;  Surgeon: Wylene Simmer, MD;  Location: [place];  Service: Orthopedics;  Laterality: Left;  requests 91mns  . ORIF RADIAL FRACTURE  11/18/10   left; s/p slip on slippery floor and fell  . THORACENTESIS  08/2017   diagnostic and therapeutic.  Transudative.  Clx neg.  (+pulm edema/diastolic HF)  . TONSILLECTOMY    . TRANSTHORACIC ECHOCARDIOGRAM  02/18/2016; 08/10/17   LVEF of 55-60%, mild AI and mild MR and normal biatrial size.  07/2017--normal LV function, mild enlarge aortic root, mild/mod TR, bilat atrial enlargement.    FAMHx:  Family History  Problem Relation Age of Onset  . Heart disease Mother   . Heart disease Father   . Hypertension Brother   . Diabetes Sister   . Colon cancer Neg Hx   . Pancreatic cancer Neg Hx   . Rectal cancer Neg Hx   . Stomach cancer Neg Hx     SOCHx:   reports that she has never smoked. She has never used smokeless tobacco. She reports that she drinks alcohol. She reports that she does not use drugs.  ALLERGIES:  Allergies  Allergen Reactions  . Augmentin [Amoxicillin-Pot Clavulanate] Nausea And Vomiting and Other (See Comments)    "projectile  vomiting" Has patient had a PCN reaction causing immediate rash, facial/tongue/throat swelling, SOB or lightheadedness with hypotension:No Has patient had a PCN reaction causing severe rash involving mucus membranes or skin necrosis:No Has patient had a PCN reaction that required hospitalization:No Has patient had a PCN reaction occurring within the last 10 years:Yes If all of the above answers are "NO", then may proceed with Cephalosporin use.     ROS: Pertinent items noted in HPI and remainder of comprehensive ROS otherwise negative.  HOME MEDS: Current Outpatient Medications on File Prior to Visit  Medication Sig Dispense Refill  . acetaminophen (TYLENOL) 325 MG tablet Take 650 mg by mouth every 6 (six) hours as needed for moderate pain or headache.     . albuterol (PROAIR HFA) 108 (90 Base) MCG/ACT inhaler INHALE 2 PUFFS INTO THE LUNGS  EVERY 4 HOURS IF NEEDED FOR WHEEZING OR SHORTNESS OF BREATH(COUGH SHORTNESS OF BREATH AND WHEEZING) (Patient taking differently: Inhale 2 puffs into the lungs every 4 (four) hours as needed for wheezing or shortness of breath. ) 8.5 g 1  . alendronate (FOSAMAX) 70 MG tablet Take 1 tablet (70 mg total) by mouth every 7 (seven) days. Take with a full glass of water on an empty stomach. (Patient taking differently: Take 70 mg by mouth every Monday. Take with a full glass of water on an empty stomach.) 4 tablet 11  . ALPRAZolam (XANAX) 0.5 MG tablet TAKE 1 TABLET BY MOUTH THREE TIMES DAILY IF NEEDED FOR STRESS (Patient taking differently: Take 0.5 mg by mouth 3 (three) times daily as needed for anxiety. ) 90 tablet 5  . amiodarone (PACERONE) 200 MG tablet Take 1 tablet (200 mg total) by mouth 2 (two) times daily.    Marland Kitchen Apoaequorin (PREVAGEN) 10 MG CAPS Take 10 mg by mouth daily.     Marland Kitchen atorvastatin (LIPITOR) 40 MG tablet TAKE 1 TABLET BY MOUTH ONCE DAILY 90 tablet 1  . calcium-vitamin D (OSCAL WITH D) 500-200 MG-UNIT per tablet Take 1 tablet by mouth every evening.      . carboxymethylcellulose (REFRESH PLUS) 0.5 % SOLN Place 2 drops into both eyes daily.    . Coenzyme Q10 200 MG capsule Take 200 mg by mouth every evening.     . fluticasone (FLONASE) 50 MCG/ACT nasal spray Place 2 sprays into both nostrils daily.     . furosemide (LASIX) 40 MG tablet TAKE 1 TABLET BY MOUTH TWICE DAILY 180 tablet 1  . metoprolol tartrate (LOPRESSOR) 100 MG tablet Take 1 tablet (100 mg total) by mouth 2 (two) times daily.  0  . Misc Natural Products (OSTEO BI-FLEX TRIPLE STRENGTH PO) Take 1 tablet by mouth daily.    . Misc Natural Products (TART CHERRY ADVANCED PO) Take 1 capsule by mouth daily.    . Multiple Vitamins-Minerals (AIRBORNE PO) Take 1 tablet by mouth daily as needed (for vacation).     . Multiple Vitamins-Minerals (CENTRUM SILVER ULTRA WOMENS) TABS Take 1 tablet by mouth every evening.     . Omega-3 Fatty Acids (FISH OIL PO) Take 2 capsules by mouth every evening.     . potassium chloride 20 MEQ TBCR Take 20 mEq by mouth 2 (two) times daily.    . rivaroxaban (XARELTO) 15 MG TABS tablet Take 1 tablet (15 mg total) by mouth daily with supper.    . sodium chloride (OCEAN) 0.65 % nasal spray Place 2 sprays into the nose daily. After shower    . trolamine salicylate (ASPERCREME) 10 % cream Apply 1 application topically daily.    . TURMERIC PO Take 1 capsule by mouth daily.     No current facility-administered medications on file prior to visit.     LABS/IMAGING: No results found for this or any previous visit (from the past 48 hour(s)). No results found.  WEIGHTS: Wt Readings from Last 3 Encounters:  10/12/17 169 lb (76.7 kg)  10/04/17 169 lb 4 oz (76.8 kg)  09/11/17 176 lb 12.8 oz (80.2 kg)    VITALS: BP (!) 157/80   Pulse (!) 58   Ht 5' (1.524 m)   Wt 169 lb (76.7 kg)   LMP  (LMP Unknown)   BMI 33.01 kg/m   EXAM: General appearance: alert and no distress Neck: no carotid bruit and no JVD Lungs: clear to auscultation bilaterally Heart: regular  rate and rhythm Abdomen: soft, non-tender; bowel sounds normal; no masses,  no organomegaly Extremities: Left lower leg in a cast Pulses: 2+ and symmetric Skin: Skin color, texture, turgor normal. No rashes or lesions Neurologic: Grossly normal Psych: Pleasant  EKG: Sinus bradycardia first-degree AV block at 58-personally reviewed  ASSESSMENT: 1. Paroxysmal atrial fibrillation-CHADSVASC score of 3 2. History of NSTEMI with mild, non-obstructive CAD by cath (08/2017) 3. Hypertension-controlled 4. Family history of stroke  PLAN: 1.   Brenda Matthews has not had recurrent atrial fibrillation.  At this point I believe we can decrease her amiodarone dose from 400 to 200 mg daily.  We will need to check liver enzymes and thyroid function.  She had no angiographically significant coronary disease based on her cath in August 2019.  We will still need to treat her aggressively.  She should remain on statin therapy.  Blood pressure has been controlled.  She is on lower dose Xarelto due to acute kidney injury and recently had a decrease in her Lasix.  Plan per PCP to check her creatinine in 4 days.  Follow-up with me in 6 months.  [name], MD, [facility], [place] Director of the [facility] Lipid [facility] &  Cardi[facility] Attending Cardiologist  Direct Dial: [phone]  Fax: [phone]  Website:  [email or web address] 10/12/2017, 3:00 PM

## 2017-10-12 NOTE — Telephone Encounter (Signed)
Left detailed message for patient's OT Therapist - okay for OT.

## 2017-10-12 NOTE — Patient Instructions (Signed)
Medication Instructions:   DECREASE amiodarone to 232m daily Continue all other current medications  Labwork:  TSH, Free T4, Liver Function Test - blood work - today  Testing/Procedures:  None  Follow-Up:  Your physician wants you to follow-up in: 6 months with Dr. HDebara Pickett You will receive a reminder letter in the mail two months in advance. If you don't receive a letter, please call our office to schedule the follow-up appointment.  If you need a refill on your cardiac medications before your next appointment, please call your pharmacy.  Any Other Special Instructions Will Be Listed Below (If Applicable).

## 2017-10-13 ENCOUNTER — Ambulatory Visit: Payer: Self-pay

## 2017-10-13 ENCOUNTER — Emergency Department (HOSPITAL_COMMUNITY)
Admission: EM | Admit: 2017-10-13 | Discharge: 2017-10-13 | Disposition: A | Discharge status: Home / Self Care | Payer: Medicare Other | Attending: Emergency Medicine | Admitting: Emergency Medicine

## 2017-10-13 ENCOUNTER — Other Ambulatory Visit: Payer: Self-pay

## 2017-10-13 ENCOUNTER — Encounter (HOSPITAL_COMMUNITY): Payer: Self-pay | Admitting: Emergency Medicine

## 2017-10-13 ENCOUNTER — Emergency Department (HOSPITAL_COMMUNITY): Payer: Medicare Other

## 2017-10-13 DIAGNOSIS — I5032 Chronic diastolic (congestive) heart failure: Secondary | ICD-10-CM | POA: Insufficient documentation

## 2017-10-13 DIAGNOSIS — I10 Essential (primary) hypertension: Secondary | ICD-10-CM | POA: Insufficient documentation

## 2017-10-13 DIAGNOSIS — Z79899 Other long term (current) drug therapy: Secondary | ICD-10-CM | POA: Insufficient documentation

## 2017-10-13 DIAGNOSIS — R519 Headache, unspecified: Secondary | ICD-10-CM

## 2017-10-13 DIAGNOSIS — I110 Hypertensive heart disease with heart failure: Secondary | ICD-10-CM | POA: Diagnosis not present

## 2017-10-13 DIAGNOSIS — R51 Headache: Secondary | ICD-10-CM | POA: Insufficient documentation

## 2017-10-13 LAB — CBC WITH DIFFERENTIAL/PLATELET
Abs Immature Granulocytes: 0 10*3/uL (ref 0.0–0.1)
BASOS ABS: 0.1 10*3/uL (ref 0.0–0.1)
Basophils Relative: 1 %
EOS ABS: 0.2 10*3/uL (ref 0.0–0.7)
Eosinophils Relative: 3 %
HCT: 41 % (ref 36.0–46.0)
Hemoglobin: 12.4 g/dL (ref 12.0–15.0)
Immature Granulocytes: 0 %
LYMPHS ABS: 2 10*3/uL (ref 0.7–4.0)
Lymphocytes Relative: 27 %
MCH: 28.8 pg (ref 26.0–34.0)
MCHC: 30.2 g/dL (ref 30.0–36.0)
MCV: 95.3 fL (ref 78.0–100.0)
Monocytes Absolute: 0.5 10*3/uL (ref 0.1–1.0)
Monocytes Relative: 7 %
NEUTROS PCT: 62 %
Neutro Abs: 4.7 10*3/uL (ref 1.7–7.7)
Platelets: 242 10*3/uL (ref 150–400)
RBC: 4.3 MIL/uL (ref 3.87–5.11)
RDW: 15.4 % (ref 11.5–15.5)
WBC: 7.5 10*3/uL (ref 4.0–10.5)

## 2017-10-13 LAB — BASIC METABOLIC PANEL
Anion gap: 8 (ref 5–15)
BUN: 24 mg/dL — AB (ref 8–23)
CO2: 26 mmol/L (ref 22–32)
CREATININE: 1.18 mg/dL — AB (ref 0.44–1.00)
Calcium: 9.1 mg/dL (ref 8.9–10.3)
Chloride: 105 mmol/L (ref 98–111)
GFR calc non Af Amer: 42 mL/min — ABNORMAL LOW (ref >60)
GFR, EST AFRICAN AMERICAN: 49 mL/min — AB (ref >60)
Glucose, Bld: 91 mg/dL (ref 70–99)
Potassium: 4.4 mmol/L (ref 3.5–5.1)
SODIUM: 139 mmol/L (ref 135–145)

## 2017-10-13 LAB — TSH: TSH: 1.57 u[IU]/mL (ref 0.450–4.500)

## 2017-10-13 LAB — HEPATIC FUNCTION PANEL
ALBUMIN: 3.9 g/dL (ref 3.5–4.7)
ALT: 21 IU/L (ref 0–32)
AST: 16 IU/L (ref 0–40)
Alkaline Phosphatase: 96 IU/L (ref 39–117)
Bilirubin Total: 0.4 mg/dL (ref 0.0–1.2)
Bilirubin, Direct: 0.14 mg/dL (ref 0.00–0.40)
TOTAL PROTEIN: 6.9 g/dL (ref 6.0–8.5)

## 2017-10-13 LAB — T4, FREE: FREE T4: 2.01 ng/dL — AB (ref 0.82–1.77)

## 2017-10-13 NOTE — Telephone Encounter (Signed)
Called pt. to discuss her BP.  Stated she woke up at 3:00 AM in pain "like there was a band around my head"; rated it at 8-9/10. Voiced that she was concerned that she could have a stroke, and took an Alprazolam at that time.  Stated her niece came this morning and checked her BP.  BP at 10:20 AM 184/70.  Stated she placed the cuff on very quickly, and the reading may not have been accurate.  Rechecked BP at 10:34 AM 158/87.  Stated she continues to feel like she has a band around her head; rated her discomfort at 3/10 at present time.  Took another dose  Alprazolam at 11:00 AM.  Reported seeing the Cardiologist yesterday, and her BP was 157/80, and she was told that it was too high, but that no changes were made.  Denied any vision changes, speech difficulty, confusion, unilateral weakness/ numbness of extremities, or balance issues at present time.  Stated she wants to see Dr. Anitra Lauth.  Voiced concern again about the possibility of having a stroke.  Advised no avail. appt. with Dr. Anitra Lauth today.  Offered to sched. an appt. on Monday, and advised this nurse will call Dr. Idelle Leech office today, to make him aware of her complaints and elevated BP.  The pt. was very adamant that she lives alone, and could have a stroke over the weekend.  Was not willing to wait until Monday, for an appt.  Called office and spoke with Lattie Haw.  Advised of pt's symptoms, and concerns about stroke.  Was advised she will address with Dr. Ernestine Conrad, when he returns from lunch, and will call pt. With further recommendations.    Informed pt. of the above.  Pt. And her niece verb. Understanding and agrees with plan.           Reason for Disposition . [4] Systolic BP  >= 709 OR Diastolic >= 80 AND [2] taking BP medications  Answer Assessment - Initial Assessment Questions 1. BLOOD PRESSURE: "What is the blood pressure?" "Did you take at least two measurements 5 minutes apart?"     184/70 at 10:20 AM, and 158/87 at 10:30 AM 2. ONSET:  "When did you take your blood pressure?"     See above 3. HOW: "How did you obtain the blood pressure?" (e.g., visiting nurse, automatic home BP monitor)     Digital cuff 4. HISTORY: "Do you have a history of high blood pressure?"     Yes 5. MEDICATIONS: "Are you taking any medications for blood pressure?" "Have you missed any doses recently?"     Denied missing any of her medications 6. OTHER SYMPTOMS: "Do you have any symptoms?" (e.g., headache, chest pain, blurred vision, difficulty breathing, weakness)     Woke up last night about 3:00 AM feeling like she had a band around her head, denied vision problems, denied speech problems, confusion, weakness, dizziness or balance.   7. PREGNANCY: "Is there any chance you are pregnant?" "When was your last menstrual period?"     N/a  Protocols used: HIGH BLOOD PRESSURE-A-AH Message from Keene Breath sent at 10/13/2017 10:51 AM EDT   Summary: Possible BP issues   Caregiver, Manuela Schwartz, called to give BP reading of 184/70. Manuela Schwartz is not on the  [facility] but she is with patient.

## 2017-10-13 NOTE — ED Notes (Signed)
Labs delayed because patient is a difficult stick, Attempted 2x. Phlebotomist now at bedside.

## 2017-10-13 NOTE — ED Triage Notes (Signed)
Pt reports waking up with a 10/10 headache and checked her BP with systolic of 252. Pt took her BP medication and her systolic became as high of 712 systolic today. Pt took 2 Tylenol and now pain is 3/10. Pt also reports sob with exertion also endorses some lightheadedness. Respirations unlabored and regular.

## 2017-10-13 NOTE — ED Provider Notes (Signed)
[facility] DEPART[facility] Provider Note   CSN: [id number] Arrival date & time: 10/13/17  1528     History   Chief Complaint Chief Complaint  Patient presents with  . Hypertension  . Headache  . Shortness of Breath    HPI Brenda Matthews is a 82 y.o. female.  HPI Patient presented with headache and hypertension.  States she checked her blood pressure and it was 361 systolic and later went up to 443 systolic  No fevers.  Had slight shortness of breath without chest pain.  Has had some recent adjustments of her medication.  Has had some renal insufficiency and is due to have blood work checked on Monday with today being Friday.  Patient states her blood pressure is come down her headaches improved.  She is however on Eliquis for atrial fibrillation.  Recently had a dose decrease due to renal insufficiency.  Saw Dr. Debara Pickett yesterday from cardiology. Past Medical History:  Diagnosis Date  . Abnormal EKG approx 2008   Nuclear stress test neg;   . Anxiety    with panic  . Arthritis   . Autoimmune hepatitis ([place])   . CAP (community acquired pneumonia) 08/2017   Hospitalization for CAP/acute diast HF/rapid a-fib  . Cataract    s/p surgery--lens implants  . Chronic diastolic heart failure ([place]) 2019  . Chronic renal insufficiency, stage II (mild) 12/04/2012   GFR 60s.  Renal u/s when in hosp 08/2017 for CAP/CHF showed symmetric kidneys, echogenicity normal, w/out hydronephrosis.  . Cirrhosis ([place])   . Diverticulosis 2009  . Fracture of radial shaft, left, closed 11/16/10   fell down flight of stairs  . History of kidney stones   . Hx of adenomatous colonic polyps 2002   surveillance colonoscopy 2009, +polypectomy done-tubular adenoma w/out high grade.  05/2013 tubular adenomas--recall 3 yrs  . Hyperlipidemia   . Hypertension   . Low TSH level 02/18/2016   T3 norm, T4 mildly elevated--suspected sick euthyroid syndrome.  Repeat labs 06/2016: normal.  . NASH  (nonalcoholic steatohepatitis)   . Osteopenia    DEXA 08/2010; repeat DEXA 02/2015 worse: fosamax started  . PAF (paroxysmal atrial fibrillation) ([place]) 02/2016   when in post-op for ankle surgery; spontaneously converted in hosp, seen by Dr. Debara Pickett in consultation--metoprolol rate control + xarelto recommended.  Metop d/c due to hypot.  Plan to cont xarelto 20 mg qd indef due to CHAD-VASc score of 3.  A-fib w/RVR and CHF 07/2017; pt placed on amiodarone and plan for CV, but pt was in sinus rhythm when she went in for her DC CV, so she was sent home.  . Peripheral edema   . Pneumonia 2015   hx  . Rheumatic fever     Patient Active Problem List   Diagnosis Date Noted  . Persistent atrial fibrillation 10/12/2017  . Acute on chronic diastolic heart failure ([place])   . S/P thoracentesis   . Acute respiratory failure ([place])   . Non-ST elevation (NSTEMI) myocardial infarction ([place])   . Sepsis ([place]) 08/30/2017  . Acute-on-chronic kidney injury ([place]) 08/30/2017  . Rash in adult 08/30/2017  . Atrial fibrillation and flutter ([place])   . Chest pain 08/29/2017  . DOE (dyspnea on exertion) 08/29/2017  . Arterial hypotension   . Chronic anticoagulation 12/19/2016  . AKI (acute kidney injury) ([place])   . Low TSH level 02/19/2016  . PAF (paroxysmal atrial fibrillation) ([place]) 02/18/2016  . Ankle fracture, left-s/p surgery 02/18/16 02/18/2016  . Closed  displaced trimalleolar fracture of left ankle 02/18/2016  . Acute bronchitis 02/10/2015  . Bronchopneumonia 12/02/2013  . Chronic renal insufficiency, stage II (mild) 12/04/2012  . Hx of adenomatous colonic polyps   . Routine general medical examination at a health care facility 09/05/2011  . Cervical cancer screening 02/24/2011  . SCIATICA, BILATERAL 09/01/2009  . SACROILIAC STRAIN, ACUTE 09/01/2009  . ANEMIA 11/07/2007  . Osteopenia of the elderly 11/07/2007  . INSOMNIA 11/07/2007  . EDEMA 08/14/2007  . Hyperlipidemia 04/04/2007  . PANIC DISORDER  04/04/2007  . Essential hypertension 04/04/2007  . Anxiety state 12/27/2006  . ARTHRITIS 12/27/2006    Past Surgical History:  Procedure Laterality Date  . APPENDECTOMY  1966   done during surgery for tubal pregnancy  . CATARACT EXTRACTION W/ INTRAOCULAR LENS IMPLANT  2013   bilat  . COLONOSCOPY W/ POLYPECTOMY  05/2013   +diverticulosis; recall 3 yrs (Dr. Henrene Pastor)  . DEXA  02/2015   T score -2.1 in both femoral necks; FRAX 10 yr risk of major osteoporotic fracture was 21%---fosamax started  . EYE SURGERY    . LEFT HEART CATH AND CORONARY ANGIOGRAPHY N/A 09/01/2017   No angiographically significant CAD.  Upper normal left ventricular filling pressure.  Procedure: LEFT HEART CATH AND CORONARY ANGIOGRAPHY;  Surgeon: Nelva Bush, MD;  Location: [facility];  Service: Cardiovascular;  Laterality: N/A;  . OPEN REDUCTION INTERNAL FIXATION (ORIF) TIBIA/FIBULA FRACTURE Left 02/18/2016   Procedure: OPEN REDUCTION INTERNAL FIXATION (ORIF) Right ankle trimalleolar fracture;  Surgeon: Wylene Simmer, MD;  Location: [facility];  Service: Orthopedics;  Laterality: Left;  requests 30mns  . ORIF RADIAL FRACTURE  11/18/10   left; s/p slip on slippery floor and fell  . THORACENTESIS  08/2017   diagnostic and therapeutic.  Transudative.  Clx neg.  (+pulm edema/diastolic HF)  . TONSILLECTOMY    . TRANSTHORACIC ECHOCARDIOGRAM  02/18/2016; 08/10/17   LVEF of 55-60%, mild AI and mild MR and normal biatrial size.  07/2017--normal LV function, mild enlarge aortic root, mild/mod TR, bilat atrial enlargement.     OB History   None      Home Medications    Prior to Admission medications   Medication Sig Start Date End Date Taking? Authorizing Provider  acetaminophen (TYLENOL) 325 MG tablet Take 650 mg by mouth every 6 (six) hours as needed for moderate pain or headache.    Yes [provider]  albuterol (PROAIR HFA) 108 (90 Base) MCG/ACT inhaler INHALE 2 PUFFS INTO THE LUNGS EVERY 4 HOURS IF NEEDED  FOR WHEEZING OR SHORTNESS OF BREATH(COUGH SHORTNESS OF BREATH AND WHEEZING) Patient taking differently: Inhale 2 puffs into the lungs every 4 (four) hours as needed for wheezing or shortness of breath.  07/27/17  Yes McGowen, PAdrian Blackwater MD  alendronate (FOSAMAX) 70 MG tablet Take 1 tablet (70 mg total) by mouth every 7 (seven) days. Take with a full glass of water on an empty stomach. Patient taking differently: Take 70 mg by mouth every Monday. Take with a full glass of water on an empty stomach. 03/06/15  Yes McGowen, PAdrian Blackwater MD  ALPRAZolam (Duanne Moron 0.5 MG tablet TAKE 1 TABLET BY MOUTH THREE TIMES DAILY IF NEEDED FOR STRESS Patient taking differently: Take 0.5 mg by mouth 3 (three) times daily as needed for anxiety.  05/08/17  Yes McGowen, PAdrian Blackwater MD  amiodarone (PACERONE) 200 MG tablet Take 200 mg by mouth every morning.    Yes [provider]  Apoaequorin (PREVAGEN) 10 MG CAPS  Take 10 mg by mouth daily.    Yes [provider]  atorvastatin (LIPITOR) 40 MG tablet TAKE 1 TABLET BY MOUTH ONCE DAILY 07/28/17  Yes McGowen, Adrian Blackwater, MD  calcium-vitamin D (OSCAL WITH D) 500-200 MG-UNIT per tablet Take 1 tablet by mouth every evening.    Yes [provider]  carboxymethylcellulose (REFRESH PLUS) 0.5 % SOLN Place 2 drops into both eyes daily.   Yes [provider]  fluticasone (FLONASE) 50 MCG/ACT nasal spray Place 2 sprays into both nostrils daily.    Yes [provider]  furosemide (LASIX) 40 MG tablet TAKE 1 TABLET BY MOUTH TWICE DAILY Patient taking differently: Take 40 mg by mouth every morning.  08/16/17  Yes McGowen, Adrian Blackwater, MD  metoprolol tartrate (LOPRESSOR) 100 MG tablet Take 1 tablet (100 mg total) by mouth 2 (two) times daily. 09/11/17  Yes Domenic Polite, MD  Multiple Vitamins-Minerals (AIRBORNE PO) Take 1 tablet by mouth daily as needed (for vacation).    Yes [provider]  Multiple Vitamins-Minerals (CENTRUM SILVER ULTRA WOMENS) TABS  Take 1 tablet by mouth every evening.    Yes [provider]  potassium chloride 20 MEQ TBCR Take 20 mEq by mouth 2 (two) times daily. 09/11/17  Yes Domenic Polite, MD  rivaroxaban (XARELTO) 15 MG TABS tablet Take 1 tablet (15 mg total) by mouth daily with supper. 09/11/17  Yes Domenic Polite, MD  sodium chloride (OCEAN) 0.65 % nasal spray Place 2 sprays into the nose daily. After shower   Yes [provider]  trolamine salicylate (ASPERCREME) 10 % cream Apply 1 application topically daily.   Yes [provider]    Family History Family History  Problem Relation Age of Onset  . Heart disease Mother   . Heart disease Father   . Hypertension Brother   . Diabetes Sister   . Colon cancer Neg Hx   . Pancreatic cancer Neg Hx   . Rectal cancer Neg Hx   . Stomach cancer Neg Hx     Social History Social History   Tobacco Use  . Smoking status: Never Smoker  . Smokeless tobacco: Never Used  Substance Use Topics  . Alcohol use: Yes    Comment: rarely  . Drug use: No     Allergies   Augmentin [amoxicillin-pot clavulanate]   Review of Systems Review of Systems  Constitutional: Negative for appetite change.  HENT: Negative for congestion.   Respiratory: Positive for shortness of breath.   Gastrointestinal: Negative for abdominal pain.  Genitourinary: Negative for dyspareunia and genital sores.  Musculoskeletal: Negative for back pain.  Neurological: Positive for headaches.  Psychiatric/Behavioral: Negative for decreased concentration.     Physical Exam Updated Vital Signs BP (!) 160/94   Pulse 85   Temp (!) 97.4 F (36.3 C) (Oral)   Resp 20   Ht 5' 2"  (1.575 m)   Wt 75.3 kg   LMP  (LMP Unknown)   SpO2 98%   BMI 30.36 kg/m   Physical Exam  Constitutional: She appears well-developed.  HENT:  Head: Normocephalic.  Eyes: EOM are normal.  Neck: Neck supple.  Cardiovascular: Normal rate.  Pulmonary/Chest: She has no wheezes. She has no  rales.  Abdominal: Soft.  Musculoskeletal: She exhibits no edema.  Skin: Skin is warm.  Psychiatric: She has a normal mood and affect.     ED Treatments / Results  Labs (all labs ordered are listed, but only abnormal results are displayed) Labs Reviewed  BASIC METABOLIC PANEL - Abnormal; Notable for the following components:      Result Value   BUN 24 (*)    Creatinine, Ser 1.18 (*)    GFR calc non Af Amer 42 (*)    GFR calc Af Amer 49 (*)    All other components within normal limits  CBC WITH DIFFERENTIAL/PLATELET    EKG EKG Interpretation  Date/Time:  Friday October 13 2017 15:41:00 EDT Ventricular Rate:  59 PR Interval:  214 QRS Duration: 86 QT Interval:  440 QTC Calculation: 435 R Axis:   46 Text Interpretation:  Sinus bradycardia with sinus arrhythmia with 1st degree A-V block Low voltage QRS Cannot rule out Anterior infarct , age undetermined Abnormal ECG Confirmed by Davonna Belling [phone]) on 10/13/2017 8:15:15 PM   Radiology Dg Chest 2 View  Result Date: 10/13/2017 CLINICAL DATA:  Hypertension and headache. EXAM: CHEST - 2 VIEW COMPARISON:  None. FINDINGS: Stable cardiomegaly. Moderate aortic atherosclerosis without aneurysm. No overt pulmonary edema. No pulmonary consolidations. Small bilateral pleural effusions with adjacent atelectasis. Degenerative changes are present along the dorsal spine. IMPRESSION: Cardiomegaly with aortic atherosclerosis. New small bilateral pleural effusions with adjacent atelectasis. Electronically Signed   By: Ashley Royalty M.D.   On: 10/13/2017 16:41   Ct Head Wo Contrast  Result Date: 10/13/2017 CLINICAL DATA:  82 year old female with headache and lightheadedness EXAM: CT HEAD WITHOUT CONTRAST TECHNIQUE: Contiguous axial images were obtained from the base of the skull through the vertex without intravenous contrast. COMPARISON:  Head CT dated 05/30/2006 FINDINGS: Brain: The ventricles and sulci are appropriate size for patient's age.  Mild periventricular and deep white matter chronic microvascular ischemic changes noted. There is no acute intracranial hemorrhage. No mass effect or midline shift. No extra-axial fluid collection. Vascular: No hyperdense vessel or unexpected calcification. Skull: Normal. Negative for fracture or focal lesion. Sinuses/Orbits: No acute finding. Other: None IMPRESSION: 1. No acute intracranial pathology. 2. Mild age-related atrophy and chronic microvascular ischemic changes. Electronically Signed   By: Anner Crete M.D.   On: 10/13/2017 21:21    Procedures Procedures (including critical care time)  Medications Ordered in ED Medications - No data to display   Initial Impression / Assessment and Plan / ED Course  I have reviewed the triage vital signs and the nursing notes.  Pertinent labs & imaging results that were available during my care of the patient were reviewed by me and considered in my medical decision making (see chart for details).     Patient presented with headache.  On Eliquis but had CT that showed no bleed.  Blood pressure improved. Has some renal insufficiency.  Lab work drawn that can be followed by PCP tomorrow.  Patient has had a long weight and I think it is reasonable for her to go home to follow this.  Discharge home.  Creatinine has actually improved.  Final Clinical Impressions(s) / ED Diagnoses   Final diagnoses:  Hypertension, unspecified type  Nonintractable headache, unspecified chronicity pattern, unspecified headache type    ED Discharge Orders    None       Davonna Belling, MD 10/14/17 0010

## 2017-10-13 NOTE — ED Provider Notes (Signed)
Patient placed in Quick Look pathway, seen and evaluated   Chief Complaint: headache  HPI:  Brenda Matthews is a 82 y.o. female who presents to the ED with headache and elevated BP. Patient reports she took her BP medication and her BP went even higher. Patient took Tylenol for her headache and her pain went from 10/10 to 3/10. Patient also reports shortness of brath with exertion and lightheadedness.   ROS: Resp: shortness of breath  Neuro: headache  Physical Exam:  BP (!) 143/72   Pulse 62   Temp (!) 97.5 F (36.4 C) (Oral)   Resp 20   Ht 5' 2"  (1.575 m)   Wt 75.3 kg   LMP  (LMP Unknown)   SpO2 98%   BMI 30.36 kg/m    Gen: No distress  Neuro: Awake and Alert  Skin: Warm and dry  Heart regular rate and rhythm  Lungs: no rales or wheezing heard    Initiation of care has begun. The patient has been counseled on the process, plan, and necessity for staying for the completion/evaluation, and the remainder of the medical screening examination    Ashley Murrain, NP 10/13/17 [place], Ankit, MD 10/13/17 [phone]

## 2017-10-13 NOTE — ED Notes (Signed)
Patient discharge home but could not wait for d/c paper work. RN went to the room and she was gone.

## 2017-10-13 NOTE — Telephone Encounter (Signed)
Based on her severe headache, significantly elevated blood pressure, and the fact that she takes blood thinner, she needs to go to the ED ASAP for evaluation.  Reassure her that being seen in my office for this is NOT the appropriate environment/location.-thx

## 2017-10-13 NOTE — Telephone Encounter (Signed)
Patient advised.  Patient was unhappy with advise to go to ER due to long wait times and did not want to be admitted into hospital.  She states that she will do as she is told and will go to [facility] ER.

## 2017-10-13 NOTE — Discharge Instructions (Addendum)
Follow-up on Monday as planned.  A basic metabolic was drawn today and can be followed by Dr. Anitra Lauth.

## 2017-10-16 ENCOUNTER — Other Ambulatory Visit: Payer: Medicare Other

## 2017-10-16 ENCOUNTER — Ambulatory Visit: Payer: Medicare Other | Admitting: Family Medicine

## 2017-10-16 ENCOUNTER — Encounter: Payer: Self-pay | Admitting: Family Medicine

## 2017-10-16 ENCOUNTER — Telehealth: Payer: Self-pay | Admitting: Family Medicine

## 2017-10-16 VITALS — BP 155/72 | HR 59 | Temp 97.8°F | Resp 16 | Ht 60.0 in | Wt 168.5 lb

## 2017-10-16 DIAGNOSIS — N183 Chronic kidney disease, stage 3 unspecified: Secondary | ICD-10-CM

## 2017-10-16 DIAGNOSIS — I480 Paroxysmal atrial fibrillation: Secondary | ICD-10-CM

## 2017-10-16 DIAGNOSIS — F411 Generalized anxiety disorder: Secondary | ICD-10-CM

## 2017-10-16 DIAGNOSIS — I10 Essential (primary) hypertension: Secondary | ICD-10-CM

## 2017-10-16 DIAGNOSIS — N2889 Other specified disorders of kidney and ureter: Secondary | ICD-10-CM

## 2017-10-16 MED ORDER — RIVAROXABAN 20 MG PO TABS: 20.0000 mg | ORAL_TABLET | Freq: Every day | ORAL | 0 refills | Status: DC

## 2017-10-16 MED ORDER — HYDRALAZINE HCL 10 MG PO TABS: 10.0000 mg | ORAL_TABLET | Freq: Three times a day (TID) | ORAL | 0 refills | Status: DC

## 2017-10-16 NOTE — Patient Instructions (Signed)
Take your new blood pressure medication called hydralazine---three times per day.  Take your alprazolam (anxiety medication) three times per day.  Take 88m xarelto once a day.  Continue to check your blood pressure and heart rate once every morning and once every night and write them down to review with me in 1 week.

## 2017-10-16 NOTE — Progress Notes (Signed)
OFFICE VISIT  10/16/2017   CC:  Chief Complaint  Patient presents with  . Follow-up    ER visit     HPI:    Patient is a 82 y.o. Caucasian female who presents for emergency dept follow up--we triaged her there on 10/13/17 (3 d/a) for HA and SOB in the setting of elevated blood pressure and being on anticoagulant. She had woken up with a "band like" HA and checked her bp and it was 509-326 systolic.  She got very anxious.   I reviewed ED data today. BP was 712W systolic. CT head neg acute.  CXR with stable CM and small bilat pleural effusions.  EKG w/out ischemic changes or ectopy. CBC normal.  Cr was back down to 1.18 (her baseline) after having recently been up to 1.75. Alprazolam: takes one at night only.  She does note that when she takes an extra alprazolam in daytime when anxiety about bp is high, her bp calms down some.    Denies SOB, CP, or cough.  No fevers.  HA is gone. BP's still 580D-983J avg systolic, with normal diastolics, HR vary from 58 to 80s.  Past Medical History:  Diagnosis Date  . Abnormal EKG approx 2008   Nuclear stress test neg;   . Anxiety    with panic  . Arthritis   . Autoimmune hepatitis ([place])   . CAP (community acquired pneumonia) 08/2017   Hospitalization for CAP/acute diast HF/rapid a-fib  . Cataract    s/p surgery--lens implants  . Chronic diastolic heart failure ([place]) 2019  . Chronic renal insufficiency, stage II (mild) 12/04/2012   GFR 60s.  Renal u/s when in hosp 08/2017 for CAP/CHF showed symmetric kidneys, echogenicity normal, w/out hydronephrosis.  . Cirrhosis ([place])   . Diverticulosis 2009  . Fracture of radial shaft, left, closed 11/16/10   fell down flight of stairs  . History of kidney stones   . Hx of adenomatous colonic polyps 2002   surveillance colonoscopy 2009, +polypectomy done-tubular adenoma w/out high grade.  05/2013 tubular adenomas--recall 3 yrs  . Hyperlipidemia   . Hypertension   . Low TSH level 02/18/2016   T3  norm, T4 mildly elevated--suspected sick euthyroid syndrome.  Repeat labs 06/2016: normal.  . NASH (nonalcoholic steatohepatitis)   . Osteopenia    DEXA 08/2010; repeat DEXA 02/2015 worse: fosamax started  . PAF (paroxysmal atrial fibrillation) ([place]) 02/2016   when in post-op for ankle surgery; spontaneously converted in hosp, seen by Dr. Debara Pickett in consultation--metoprolol rate control + xarelto recommended.  Metop d/c due to hypot.  Plan to cont xarelto 20 mg qd indef due to CHAD-VASc score of 3.  A-fib w/RVR and CHF 07/2017; pt placed on amiodarone and plan for CV, but pt was in sinus rhythm when she went in for her DC CV, so she was sent home.  . Peripheral edema   . Pneumonia 2015   hx  . Rheumatic fever     Past Surgical History:  Procedure Laterality Date  . APPENDECTOMY  1966   done during surgery for tubal pregnancy  . CATARACT EXTRACTION W/ INTRAOCULAR LENS IMPLANT  2013   bilat  . COLONOSCOPY W/ POLYPECTOMY  05/2013   +diverticulosis; recall 3 yrs (Dr. Henrene Pastor)  . DEXA  02/2015   T score -2.1 in both femoral necks; FRAX 10 yr risk of major osteoporotic fracture was 21%---fosamax started  . EYE SURGERY    . LEFT HEART CATH AND CORONARY ANGIOGRAPHY N/A 09/01/2017  No angiographically significant CAD.  Upper normal left ventricular filling pressure.  Procedure: LEFT HEART CATH AND CORONARY ANGIOGRAPHY;  Surgeon: Nelva Bush, MD;  Location: [facility];  Service: Cardiovascular;  Laterality: N/A;  . OPEN REDUCTION INTERNAL FIXATION (ORIF) TIBIA/FIBULA FRACTURE Left 02/18/2016   Procedure: OPEN REDUCTION INTERNAL FIXATION (ORIF) Right ankle trimalleolar fracture;  Surgeon: Wylene Simmer, MD;  Location: [facility];  Service: Orthopedics;  Laterality: Left;  requests 18mns  . ORIF RADIAL FRACTURE  11/18/10   left; s/p slip on slippery floor and fell  . THORACENTESIS  08/2017   diagnostic and therapeutic.  Transudative.  Clx neg.  (+pulm edema/diastolic HF)  . TONSILLECTOMY    .  TRANSTHORACIC ECHOCARDIOGRAM  02/18/2016; 08/10/17   LVEF of 55-60%, mild AI and mild MR and normal biatrial size.  07/2017--normal LV function, mild enlarge aortic root, mild/mod TR, bilat atrial enlargement.    Outpatient Medications Prior to Visit  Medication Sig Dispense Refill  . acetaminophen (TYLENOL) 325 MG tablet Take 650 mg by mouth every 6 (six) hours as needed for moderate pain or headache.     . albuterol (PROAIR HFA) 108 (90 Base) MCG/ACT inhaler INHALE 2 PUFFS INTO THE LUNGS EVERY 4 HOURS IF NEEDED FOR WHEEZING OR SHORTNESS OF BREATH(COUGH SHORTNESS OF BREATH AND WHEEZING) (Patient taking differently: Inhale 2 puffs into the lungs every 4 (four) hours as needed for wheezing or shortness of breath. ) 8.5 g 1  . alendronate (FOSAMAX) 70 MG tablet Take 1 tablet (70 mg total) by mouth every 7 (seven) days. Take with a full glass of water on an empty stomach. (Patient taking differently: Take 70 mg by mouth every Monday. Take with a full glass of water on an empty stomach.) 4 tablet 11  . ALPRAZolam (XANAX) 0.5 MG tablet TAKE 1 TABLET BY MOUTH THREE TIMES DAILY IF NEEDED FOR STRESS (Patient taking differently: Take 0.5 mg by mouth 3 (three) times daily as needed for anxiety. ) 90 tablet 5  . amiodarone (PACERONE) 200 MG tablet Take 200 mg by mouth every morning.     .Marland Kitchenatorvastatin (LIPITOR) 40 MG tablet TAKE 1 TABLET BY MOUTH ONCE DAILY 90 tablet 1  . calcium-vitamin D (OSCAL WITH D) 500-200 MG-UNIT per tablet Take 1 tablet by mouth every evening.     . carboxymethylcellulose (REFRESH PLUS) 0.5 % SOLN Place 2 drops into both eyes daily.    . fluticasone (FLONASE) 50 MCG/ACT nasal spray Place 2 sprays into both nostrils daily.     . furosemide (LASIX) 40 MG tablet TAKE 1 TABLET BY MOUTH TWICE DAILY (Patient taking differently: Take 40 mg by mouth every morning. ) 180 tablet 1  . metoprolol tartrate (LOPRESSOR) 100 MG tablet Take 1 tablet (100 mg total) by mouth 2 (two) times daily.  0  .  Multiple Vitamins-Minerals (AIRBORNE PO) Take 1 tablet by mouth daily as needed (for vacation).     . Multiple Vitamins-Minerals (CENTRUM SILVER ULTRA WOMENS) TABS Take 1 tablet by mouth every evening.     . potassium chloride 20 MEQ TBCR Take 20 mEq by mouth 2 (two) times daily.    . sodium chloride (OCEAN) 0.65 % nasal spray Place 2 sprays into the nose daily. After shower    . trolamine salicylate (ASPERCREME) 10 % cream Apply 1 application topically daily.    .Marland KitchenApoaequorin (PREVAGEN) 10 MG CAPS Take 10 mg by mouth daily.     . rivaroxaban (XARELTO) 15 MG TABS tablet Take 1  tablet (15 mg total) by mouth daily with supper.     No facility-administered medications prior to visit.     Allergies  Allergen Reactions  . Augmentin [Amoxicillin-Pot Clavulanate] Nausea And Vomiting and Other (See Comments)    "projectile vomiting" Has patient had a PCN reaction causing immediate rash, facial/tongue/throat swelling, SOB or lightheadedness with hypotension:No Has patient had a PCN reaction causing severe rash involving mucus membranes or skin necrosis:No Has patient had a PCN reaction that required hospitalization:No Has patient had a PCN reaction occurring within the last 10 years:Yes If all of the above answers are "NO", then may proceed with Cephalosporin use.     ROS As per HPI  PE: Blood pressure (!) 155/72, pulse (!) 59, temperature 97.8 F (36.6 C), temperature source Oral, resp. rate 16, height 5' (1.524 m), weight 168 lb 8 oz (76.4 kg), SpO2 94 %. Gen: Alert, well appearing.  Patient is oriented to person, place, time, and situation. AFFECT: pleasant, lucid thought and speech.  She is nervous. WPY:KDXI: no injection, icteris, swelling, or exudate.  EOMI, PERRLA. Mouth: lips without lesion/swelling.  Oral mucosa pink and moist. Oropharynx without erythema, exudate, or swelling.  CV: RRR, very soft systolic murmur at cardiac base, no r/g.   LUNGS: CTA bilat, nonlabored resps, good  aeration in all lung fields.  No egophony. EXT: no clubbing or cyanosis.  no edema.     LABS:    Chemistry      Component Value Date/Time   NA 139 10/13/2017 2220   K 4.4 10/13/2017 2220   CL 105 10/13/2017 2220   CO2 26 10/13/2017 2220   BUN 24 (H) 10/13/2017 2220   CREATININE 1.18 (H) 10/13/2017 2220   CREATININE 0.92 12/07/2012 1122      Component Value Date/Time   CALCIUM 9.1 10/13/2017 2220   ALKPHOS 96 10/12/2017 1538   AST 16 10/12/2017 1538   ALT 21 10/12/2017 1538   BILITOT 0.4 10/12/2017 1538     Lab Results  Component Value Date   TROPONINI 0.11 ([facility]) 08/31/2017    IMPRESSION AND PLAN:  1) HTN: not well controlled.  She is VERY anxious about her bp. Encouraged her to use her xanax 0.49m tid scheduled. Will add hydralazine 124mtid to her lopressor 100 mg bid. Shooting for goal of 130/80 or better given her dx of diastolic dysfunction.  2) CRI III: her Cr is back to her baseline (GFR about 50 ml/min): as long as she remains at 45-50 ml/min GFR I think it is ok for her to be on 20102md dosing of her xarelto.  Below this GFR, she needs to be on renal dosing. Will recheck BMET 1 wk when I see her back in the office.  3) PAF: asymptomatic.  Rate control good.  No signs of bleeding on xarelto. Go back to 68m61m xarelto dosing as per #2 above. I ran this by her cardiologist, Dr. HiltDebara Pickettd he was fine with this.  Spent 40 min with pt today, with >50% of this time spent in counseling and care coordination regarding the above problems.  An After Visit Summary was printed and given to the patient.  FOLLOW UP: Return in about 1 week (around 10/23/2017) for f/u HTN and recheck BMET.  Signed:  PhilCrissie Sickles           10/16/2017

## 2017-10-16 NOTE — Telephone Encounter (Signed)
Left detailed message on patient's phone, okay per DPR.

## 2017-10-16 NOTE — Telephone Encounter (Signed)
Pls call pt and notify her that I updated Dr. Debara Pickett (her cardiologist) about our visit today and he agrees with the xarelto dose of 20 mg per day.-thx

## 2017-10-19 ENCOUNTER — Ambulatory Visit: Payer: Medicare Other | Admitting: Family Medicine

## 2017-10-20 ENCOUNTER — Ambulatory Visit: Payer: Self-pay

## 2017-10-20 NOTE — Telephone Encounter (Signed)
OK. Make sure she is taking her alprazolam three times per day like we talked about. Also, have her increase the latest blood pressure medication I started (hydralazine) to TWO of the 2m tabs three times a day.-thx

## 2017-10-20 NOTE — Telephone Encounter (Signed)
Otis Brace, PT with [facility] called to report patient's BP reading. He says "her BP about 40 minutes ago was 180/100, then 15 minutes ago 180/98, both checked manually. She has no dizziness, no weakness, but c/o head being full. She has been taking her medication without missing doses. Her last BP medication was taken around 6 am and she will be taking another dose soon. She also has some ankle swelling, which she is wearing her ted hose, but not all the time. Can she remove them at all?" I advised she can remove them, but should wear them as much as she can. She has an appointment already scheduled on Monday, 10/23/17 with Dr. Anitra Lauth. I advised I will send this to him and if there are recommendations, someone from the office will call her. I asked for her documented BP readings, they are:  10/17/17-155/72 10/18/17-120/62 and 144/70 10/19/17-155/something.    Reason for Disposition . Systolic BP  >= 923 OR Diastolic >= 300  Answer Assessment - Initial Assessment Questions 1. BLOOD PRESSURE: "What is the blood pressure?" "Did you take at least two measurements 5 minutes apart?"     180/100; 180/98 2. ONSET: "When did you take your blood pressure?"     40 minutes ago for the first, then the last 10 minutes for the last reading 3. HOW: "How did you obtain the blood pressure?" (e.g., visiting nurse, automatic home BP monitor)    Manual by visiting PT 4. HISTORY: "Do you have a history of high blood pressure?"     Yes 5. MEDICATIONS: "Are you taking any medications for blood pressure?" "Have you missed any doses recently?"     Yes and yes 6. OTHER SYMPTOMS: "Do you have any symptoms?" (e.g., headache, chest pain, blurred vision, difficulty breathing, weakness)     Light pressure in head, ankle swelling 7. PREGNANCY: "Is there any chance you are pregnant?" "When was your last menstrual period?"     No  Protocols used: HIGH BLOOD PRESSURE-A-AH

## 2017-10-20 NOTE — Telephone Encounter (Signed)
Left message for pt to call back.   Okay for PEC to advise pt.

## 2017-10-22 ENCOUNTER — Other Ambulatory Visit: Payer: Self-pay | Admitting: Family Medicine

## 2017-10-23 ENCOUNTER — Ambulatory Visit: Payer: Medicare Other | Admitting: Family Medicine

## 2017-10-23 ENCOUNTER — Encounter: Payer: Self-pay | Admitting: Family Medicine

## 2017-10-23 VITALS — BP 166/75 | HR 58 | Temp 97.8°F | Resp 16 | Ht 60.0 in | Wt 168.5 lb

## 2017-10-23 DIAGNOSIS — I10 Essential (primary) hypertension: Secondary | ICD-10-CM

## 2017-10-23 MED ORDER — HYDRALAZINE HCL 10 MG PO TABS: ORAL_TABLET | ORAL | 0 refills | Status: DC

## 2017-10-23 NOTE — Telephone Encounter (Signed)
Pt was seen today for ov for HTN.

## 2017-10-23 NOTE — Addendum Note (Signed)
Addended by: Ralph Dowdy on: 10/23/2017 10:34 AM   Modules accepted: Orders

## 2017-10-23 NOTE — Progress Notes (Signed)
OFFICE VISIT  10/23/2017   CC:  Chief Complaint  Patient presents with  . Follow-up    HTN    HPI:    Patient is a 82 y.o. Caucasian female who presents accompanied by her caregiver for 1 week f/u HTN. 150/75 avg bp, hr avg low 60s. No side effects from hydralazine at 34m tid dose.  She is taking alprz qAM and qhs now, not mid day dose yet though. She's very anxious about her bp as usual. No new complaints, just still very anxious about her bp. She has HEleelenursing, PT, OT.  Past Medical History:  Diagnosis Date  . Abnormal EKG approx 2008   Nuclear stress test neg;   . Anxiety    with panic  . Arthritis   . [place]immune [place]   . CAP (community acquired pneumonia) 08/2017   Hospitalization for CAP/acute diast HF/rapid a-fib  . Cataract    s/p surgery--lens implants  . [place] heart [place]  . Chronic renal insufficiency, stage II (mild) 12/04/2012   GFR 60s.  Renal u/s when in hosp 08/2017 for CAP/CHF showed symmetric kidneys, echogenicity normal, w/out hydronephrosis.  . [place]   . Diverticulosis 2009  . Fracture of radial shaft, left, closed 11/16/10   fell down flight of stairs  . History of kidney stones   . Hx of adenomatous colonic polyps 2002   surveillance colonoscopy 2009, +polypectomy done-tubular adenoma w/out high grade.  05/2013 tubular adenomas--recall 3 yrs  . Hyperlipidemia   . Hypertension   . Low TSH level 02/18/2016   T3 norm, T4 mildly elevated--suspected sick euthyroid syndrome.  Repeat labs 06/2016: normal.  . NASH (nonalcoholic steatohepatitis)   . Osteopenia    DEXA 08/2010; repeat DEXA 02/2015 worse: fosamax started  . [place]   when in post-op for ankle surgery; spontaneously converted in hosp, seen by Dr. HDebara Pickettin consultation--metoprolol rate control + xarelto recommended.  Metop d/c due to hypot.  Plan to cont xarelto 20 mg qd indef due to CHAD-VASc score of 3.   A-fib w/RVR and CHF 07/2017; pt placed on amiodarone and plan for CV, but pt was in sinus rhythm when she went in for her DC CV, so she was sent home.  . Peripheral edema   . Pneumonia 2015   hx  . Rheumatic fever     Past Surgical History:  Procedure Laterality Date  . APPENDECTOMY  1966   done during surgery for tubal pregnancy  . CATARACT EXTRACTION W/ INTRAOCULAR LENS IMPLANT  2013   bilat  . COLONOSCOPY W/ POLYPECTOMY  05/2013   +diverticulosis; recall 3 yrs (Dr. PHenrene Pastor  . DEXA  02/2015   T score -2.1 in both femoral necks; FRAX 10 yr risk of major osteoporotic fracture was 21%---fosamax started  . EYE SURGERY    . LEFT HEART CATH AND CORONARY ANGIOGRAPHY N/A 09/01/2017   No angiographically significant CAD.  Upper normal left ventricular filling pressure.  Procedure: LEFT HEART CATH AND CORONARY ANGIOGRAPHY;  Surgeon: ENelva Bush MD;  [facility];  Service: Cardiovascular;  Laterality: N/A;  . OPEN REDUCTION INTERNAL FIXATION (ORIF) TIBIA/FIBULA FRACTURE Left 02/18/2016   Procedure: OPEN REDUCTION INTERNAL FIXATION (ORIF) Right ankle trimalleolar fracture;  Surgeon: JWylene Simmer MD;  [place]  Service: Orthopedics;  Laterality: Left;  requests 933ms  . ORIF RADIAL FRACTURE  11/18/10   left; s/p slip on slippery floor and fell  . THORACENTESIS  08/2017   diagnostic and therapeutic.  Transudative.  Clx neg.  (+pulm edema/diastolic HF)  . TONSILLECTOMY    . TRANSTHORACIC ECHOCARDIOGRAM  02/18/2016; 08/10/17   LVEF of 55-60%, mild AI and mild MR and normal biatrial size.  07/2017--normal LV function, mild enlarge aortic root, mild/mod TR, bilat atrial enlargement.    Outpatient Medications Prior to Visit  Medication Sig Dispense Refill  . acetaminophen (TYLENOL) 325 MG tablet Take 650 mg by mouth every 6 (six) hours as needed for moderate pain or headache.     . albuterol (PROAIR HFA) 108 (90 Base) MCG/ACT inhaler INHALE 2 PUFFS INTO THE LUNGS EVERY 4 HOURS  IF NEEDED FOR WHEEZING OR SHORTNESS OF BREATH(COUGH SHORTNESS OF BREATH AND WHEEZING) (Patient taking differently: Inhale 2 puffs into the lungs every 4 (four) hours as needed for wheezing or shortness of breath. ) 8.5 g 1  . alendronate (FOSAMAX) 70 MG tablet Take 1 tablet (70 mg total) by mouth every 7 (seven) days. Take with a full glass of water on an empty stomach. (Patient taking differently: Take 70 mg by mouth every Monday. Take with a full glass of water on an empty stomach.) 4 tablet 11  . ALPRAZolam (XANAX) 0.5 MG tablet TAKE 1 TABLET BY MOUTH THREE TIMES DAILY IF NEEDED FOR STRESS (Patient taking differently: Take 0.5 mg by mouth 3 (three) times daily as needed for anxiety. ) 90 tablet 5  . amiodarone (PACERONE) 200 MG tablet Take 200 mg by mouth every morning.     Marland Kitchen atorvastatin (LIPITOR) 40 MG tablet TAKE 1 TABLET BY MOUTH ONCE DAILY 90 tablet 1  . calcium-vitamin D (OSCAL WITH D) 500-200 MG-UNIT per tablet Take 1 tablet by mouth every evening.     . carboxymethylcellulose (REFRESH PLUS) 0.5 % SOLN Place 2 drops into both eyes daily.    . fluticasone (FLONASE) 50 MCG/ACT nasal spray Place 2 sprays into both nostrils daily.     . furosemide (LASIX) 40 MG tablet TAKE 1 TABLET BY MOUTH TWICE DAILY (Patient taking differently: Take 40 mg by mouth every morning. ) 180 tablet 1  . metoprolol tartrate (LOPRESSOR) 100 MG tablet Take 1 tablet (100 mg total) by mouth 2 (two) times daily.  0  . Multiple Vitamins-Minerals (AIRBORNE PO) Take 1 tablet by mouth daily as needed (for vacation).     . Multiple Vitamins-Minerals (CENTRUM SILVER ULTRA WOMENS) TABS Take 1 tablet by mouth every evening.     . potassium chloride 20 MEQ TBCR Take 20 mEq by mouth 2 (two) times daily.    . rivaroxaban (XARELTO) 20 MG TABS tablet Take 1 tablet (20 mg total) by mouth daily with supper. 30 tablet 0  . sodium chloride (OCEAN) 0.65 % nasal spray Place 2 sprays into the nose daily. After shower    . trolamine  salicylate (ASPERCREME) 10 % cream Apply 1 application topically daily.    . hydrALAZINE (APRESOLINE) 10 MG tablet Take 1 tablet (10 mg total) by mouth 3 (three) times daily. 90 tablet 0   No facility-administered medications prior to visit.     Allergies  Allergen Reactions  . Augmentin [Amoxicillin-Pot Clavulanate] Nausea And Vomiting and Other (See Comments)    "projectile vomiting" Has patient had a PCN reaction causing immediate rash, facial/tongue/throat swelling, SOB or lightheadedness with hypotension:No Has patient had a PCN reaction causing severe rash involving mucus membranes or skin necrosis:No Has patient had a PCN reaction that required hospitalization:No Has patient had a PCN reaction occurring  within the last 10 years:Yes If all of the above answers are "NO", then may proceed with Cephalosporin use.     ROS As per HPI  PE: Blood pressure (!) 166/75, pulse (!) 58, temperature 97.8 F (36.6 C), temperature source Oral, resp. rate 16, height 5' (1.524 m), weight 168 lb 8 oz (76.4 kg), SpO2 96 %. Body mass index is 32.91 kg/m.  Gen: Alert, well appearing.  Patient is oriented to person, place, time, and situation. AFFECT: pleasant, lucid thought and speech. CV: RRR, no m/r/g.   LUNGS: CTA bilat, nonlabored resps, good aeration in all lung fields. EXT: no clubbing or cyanosis.  no edema.    LABS:    Chemistry      Component Value Date/Time   NA 139 10/13/2017 2220   K 4.4 10/13/2017 2220   CL 105 10/13/2017 2220   CO2 26 10/13/2017 2220   BUN 24 (H) 10/13/2017 2220   CREATININE 1.18 (H) 10/13/2017 2220   CREATININE 0.92 12/07/2012 1122      Component Value Date/Time   CALCIUM 9.1 10/13/2017 2220   ALKPHOS 96 10/12/2017 1538   AST 16 10/12/2017 1538   ALT 21 10/12/2017 1538   BILITOT 0.4 10/12/2017 1538      IMPRESSION AND PLAN:  Uncontrolled HTN: Increase hydralazine to TWO of the 17m tabs tid. Continue to take alpraz bid, gradually work towards  getting that mid day dose in. Goal bp 140/90 or better. BMET today--watch her CRI. An After Visit Summary was printed and given to the patient.  FOLLOW UP: Return in about 2 weeks (around 11/06/2017) for f/u HTN.  Signed:  PCrissie Sickles MD           10/23/2017

## 2017-10-24 ENCOUNTER — Ambulatory Visit: Payer: Medicare Other | Admitting: Internal Medicine

## 2017-10-24 LAB — BASIC METABOLIC PANEL
BUN/Creatinine Ratio: 18 (calc) (ref 6–22)
BUN: 22 mg/dL (ref 7–25)
CALCIUM: 9.8 mg/dL (ref 8.6–10.4)
CO2: 25 mmol/L (ref 20–32)
Chloride: 102 mmol/L (ref 98–110)
Creat: 1.23 mg/dL — ABNORMAL HIGH (ref 0.60–0.88)
GLUCOSE: 94 mg/dL (ref 65–99)
POTASSIUM: 4.6 mmol/L (ref 3.5–5.3)
Sodium: 138 mmol/L (ref 135–146)

## 2017-10-25 ENCOUNTER — Telehealth: Payer: Self-pay | Admitting: Family Medicine

## 2017-10-25 NOTE — Telephone Encounter (Addendum)
Message from [facility]: Pt stated her physician that treated her at [facility] discharged her and prescribed Metoprolol 100 mg twice daily. Pt is asking if it is ok for her to take 2- of the 50 mg tablets at home or does she need a new prescription.  Please advise. Thanks.

## 2017-10-25 NOTE — Telephone Encounter (Signed)
Two of the 38m tabs TWICE per day is fine.-thx

## 2017-10-25 NOTE — Telephone Encounter (Signed)
Patient's caregiver dropped off trip cancellation insurance form today requesting form to be completed by pcp.  Please call patient at [phone] or [phone] when form is ready to be picked up.  Charge sheet generated, attached to form and placed in md folder at front desk.

## 2017-10-25 NOTE — Telephone Encounter (Signed)
Form is in blue folder, placed on Dr. Isla Pence desk for review/completion.

## 2017-10-25 NOTE — Telephone Encounter (Signed)
Left message for pt to call back  °

## 2017-10-25 NOTE — Telephone Encounter (Signed)
Copied from [place] [id number]. Topic: Quick Communication - Office Called Patient >> Oct 20, 2017  4:52 PM Onalee Hua, CMA wrote: Left message for pt to call back in regards to her BP.  Okay for PEC to advise pt of message from provider: Make sure she is taking her alprazolam three times per day like we talked about. Also, have her increase the latest blood pressure medication I started (hydralazine) to TWO of the 15m tabs three times a day.  Please let me know if pt calls back and was advised. Thanks.   Pt called back. Pt given the message.   Pt stated her physician that treated her at [facility] discharged her and prescribed Metoprolol 100 mg twice daily. Pt is asking if it is ok for her to take 2- of the 50 mg tablets at home or does she need a new prescription.

## 2017-10-26 ENCOUNTER — Telehealth: Payer: Self-pay | Admitting: Internal Medicine

## 2017-10-26 MED ORDER — METOPROLOL TARTRATE 100 MG PO TABS: 100.0000 mg | ORAL_TABLET | Freq: Two times a day (BID) | ORAL | 3 refills | Status: DC

## 2017-10-26 MED ORDER — POTASSIUM CHLORIDE ER 20 MEQ PO TBCR: 20.0000 meq | EXTENDED_RELEASE_TABLET | Freq: Two times a day (BID) | ORAL | 3 refills | Status: DC

## 2017-10-26 NOTE — Telephone Encounter (Signed)
 *  STAT* If patient is at the pharmacy, call can be transferred to refill team.   1. Which medications need to be refilled? (please list name of each medication and dose if known)  potassium chloride 20 MEQ TBCR  2. Which pharmacy/location (including street and city if local pharmacy) is medication to be sent to? [facility] [phone]  3. Do they need a 30 day or 90 day supply? 30   Pt c/o medication issue: 1. Name of Medication: metoprolol tartrate (LOPRESSOR) 100 MG tablet  2. How are you currently taking this medication (dosage and times per day)?Take 1 tablet (100 mg total) by mouth 2 (two) times daily.  3. Are you having a reaction (difficulty breathing--STAT)?  NA 4. What is your medication issue? Pharmacy filled med with a 25 mg tablet and she wants to know should she be taking 4 pills twice a day?

## 2017-10-26 NOTE — Telephone Encounter (Signed)
Spoke with patient. She states that she needs medication refilled.  MEDICATION GIVEN WHILE AT  NURSING FACILITY.   OKAY TO  THTAKE MEDICATION THAT EQUALS THE CORRECT QUANTITY 100 MG  TWICE A DAY - METOPROLOL.  POTASSIUM 20 MEQ  TWICE A DAY.   BOTH MEDICATION 90 DAY SUPPLY WITH 3 REFILLS.  PATIENT VERBALIZED UNDERSTANDING.

## 2017-10-27 ENCOUNTER — Telehealth: Payer: Self-pay

## 2017-10-27 NOTE — Telephone Encounter (Signed)
Walk in form brought to triage. Pt dropped out travel form to be completed in Dr.Hilty. Placed in Dr.Hilty box.

## 2017-10-30 NOTE — Telephone Encounter (Signed)
Patient's daughter picked up form.

## 2017-10-30 NOTE — Telephone Encounter (Signed)
Form picked up from md basket in nurse station.  Called patient to inform her form is ready for pick up. Copy of form made to be scanned in to chart.

## 2017-10-31 NOTE — Telephone Encounter (Signed)
Pt advised and voiced understanding.   

## 2017-11-01 NOTE — Telephone Encounter (Signed)
Patient notified that her trip insurance form has been completed and is ready for pick up. Her niece Alan Ripper will pick up for her. Advised that she will need to tell front desk her name/DOB.

## 2017-11-02 ENCOUNTER — Other Ambulatory Visit: Payer: Self-pay

## 2017-11-02 MED ORDER — HYDRALAZINE HCL 10 MG PO TABS: ORAL_TABLET | ORAL | 0 refills | Status: DC

## 2017-11-05 ENCOUNTER — Other Ambulatory Visit: Payer: Self-pay | Admitting: Family Medicine

## 2017-11-07 ENCOUNTER — Encounter: Payer: Self-pay | Admitting: Family Medicine

## 2017-11-07 ENCOUNTER — Ambulatory Visit: Payer: Medicare Other | Admitting: Family Medicine

## 2017-11-07 VITALS — BP 156/69 | HR 57 | Temp 97.9°F | Resp 16 | Ht 60.0 in | Wt 166.2 lb

## 2017-11-07 DIAGNOSIS — I10 Essential (primary) hypertension: Secondary | ICD-10-CM

## 2017-11-07 DIAGNOSIS — N183 Chronic kidney disease, stage 3 unspecified: Secondary | ICD-10-CM

## 2017-11-07 NOTE — Progress Notes (Signed)
OFFICE VISIT  11/07/2017   CC:  Chief Complaint  Patient presents with  . Follow-up    HTN, pt is not fasting.      HPI:    Patient is a 82 y.o. Caucasian female who presents for 2 wk f/u HTN in the setting of CRI stage 3 (baseline Cr approx 1.2.-1.3). Last visit I increased her hydralazine to TWO of the 31m tabs tid. Home bp's [id number], diastolics 637G-90S   She feels no HA, CP, SOB, or vision changes.  No focal weakness.  PT and OT have discharged her.  She takes amiodarone 2041monce a day--as per cardiologist's recommendations. Also, takes full dose xarelto and toprol XL 10043mid.   Past Medical History:  Diagnosis Date  . Abnormal EKG approx 2008   Nuclear stress test neg;   . Anxiety    with panic  . Arthritis   . [place]immune [place] ([facility] . CAP (community acquired pneumonia) 08/2017   Hospitalization for CAP/acute diast HF/rapid a-fib  . Cataract    s/p surgery--lens implants  . Chronic diastolic heart failure ([place]  . Chronic renal insufficiency, stage II (mild) 12/04/2012   GFR 60s.  Renal u/s when in hosp 08/2017 for CAP/CHF showed symmetric kidneys, echogenicity normal, w/out hydronephrosis.  . Cir[place]osis ([place] . Diverticulosis 2009  . Fracture of radial shaft, left, closed 11/16/10   fell down flight of stairs  . History of kidney stones   . Hx of adenomatous colonic polyps 2002   surveillance colonoscopy 2009, +polypectomy done-tubular adenoma w/out high grade.  05/2013 tubular adenomas--recall 3 yrs  . Hyperlipidemia   . Hypertension   . Low TSH level 02/18/2016   T3 norm, T4 mildly elevated--suspected sick euthyroid syndrome.  Repeat labs 06/2016: normal.  . NASH (nonalcoholic steatohepatitis)   . Osteopenia    DEXA 08/2010; repeat DEXA 02/2015 worse: fosamax started  . PAF (paroxysmal atrial fibrillation) ([place]   when in post-op for ankle surgery; spontaneously converted in hosp, seen by Dr. HilDebara Pickett  consultation--metoprolol rate control + xarelto recommended.  Metop d/c due to hypot.  Plan to cont xarelto 20 mg qd indef due to CHAD-VASc score of 3.  A-fib w/RVR and CHF 07/2017; pt placed on amiodarone and plan for CV, but pt was in sinus rhythm when she went in for her DC CV, so she was sent home.  . Peripheral edema   . Pneumonia 2015   hx  . Rheumatic fever     Past Surgical History:  Procedure Laterality Date  . APPENDECTOMY  1966   done during surgery for tubal pregnancy  . CATARACT EXTRACTION W/ INTRAOCULAR LENS IMPLANT  2013   bilat  . COLONOSCOPY W/ POLYPECTOMY  05/2013   +diverticulosis; recall 3 yrs (Dr. PerHenrene Pastor. DEXA  02/2015   T score -2.1 in both femoral necks; FRAX 10 yr risk of major osteoporotic fracture was 21%---fosamax started  . EYE SURGERY    . LEFT HEART CATH AND CORONARY ANGIOGRAPHY N/A 09/01/2017   No angiographically significant CAD.  Upper normal left ventricular filling pressure.  Procedure: LEFT HEART CATH AND CORONARY ANGIOGRAPHY;  Surgeon: EndNelva BushD;  Location: [facility];  Service: Cardiovascular;  Laterality: N/A;  . OPEN REDUCTION INTERNAL FIXATION (ORIF) TIBIA/FIBULA FRACTURE Left 02/18/2016   Procedure: OPEN REDUCTION INTERNAL FIXATION (ORIF) Right ankle trimalleolar fracture;  Surgeon: JohWylene SimmerD;  [facility];  Laterality: Left;  requests  46mns  . ORIF RADIAL FRACTURE  11/18/10   left; s/p slip on slippery floor and fell  . THORACENTESIS  08/2017   diagnostic and therapeutic.  Transudative.  Clx neg.  (+pulm edema/diastolic HF)  . TONSILLECTOMY    . TRANSTHORACIC ECHOCARDIOGRAM  02/18/2016; 08/10/17   LVEF of 55-60%, mild AI and mild MR and normal biatrial size.  07/2017--normal LV function, mild enlarge aortic root, mild/mod TR, bilat atrial enlargement.    Outpatient Medications Prior to Visit  Medication Sig Dispense Refill  . acetaminophen (TYLENOL) 325 MG tablet Take 650 mg by mouth every 6  (six) hours as needed for moderate pain or headache.     . albuterol (PROAIR HFA) 108 (90 Base) MCG/ACT inhaler INHALE 2 PUFFS INTO THE LUNGS EVERY 4 HOURS IF NEEDED FOR WHEEZING OR SHORTNESS OF BREATH(COUGH SHORTNESS OF BREATH AND WHEEZING) (Patient taking differently: Inhale 2 puffs into the lungs every 4 (four) hours as needed for wheezing or shortness of breath. ) 8.5 g 1  . alendronate (FOSAMAX) 70 MG tablet Take 1 tablet (70 mg total) by mouth every 7 (seven) days. Take with a full glass of water on an empty stomach. (Patient taking differently: Take 70 mg by mouth every Monday. Take with a full glass of water on an empty stomach.) 4 tablet 11  . ALPRAZolam (XANAX) 0.5 MG tablet TAKE 1 TABLET BY MOUTH THREE TIMES DAILY IF NEEDED FOR STRESS (Patient taking differently: Take 0.5 mg by mouth 3 (three) times daily as needed for anxiety. ) 90 tablet 5  . amiodarone (PACERONE) 200 MG tablet Take 200 mg by mouth every morning.     .Marland Kitchenatorvastatin (LIPITOR) 40 MG tablet TAKE 1 TABLET BY MOUTH ONCE DAILY 90 tablet 1  . Calcium Carbonate (CALCIUM 600 PO) Take 600 mg by mouth daily.    . carboxymethylcellulose (REFRESH PLUS) 0.5 % SOLN Place 2 drops into both eyes daily.    . fluticasone (FLONASE) 50 MCG/ACT nasal spray Place 2 sprays into both nostrils daily.     . furosemide (LASIX) 40 MG tablet TAKE 1 TABLET BY MOUTH TWICE DAILY (Patient taking differently: Take 40 mg by mouth every morning. ) 180 tablet 1  . hydrALAZINE (APRESOLINE) 10 MG tablet Take 2 tablets (20 mg total) by mouth 3 (three) times daily. 180 tablet 0  . metoprolol tartrate (LOPRESSOR) 100 MG tablet Take 1 tablet (100 mg total) by mouth 2 (two) times daily. 180 tablet 3  . Multiple Vitamins-Minerals (AIRBORNE PO) Take 1 tablet by mouth daily as needed (for vacation).     . Multiple Vitamins-Minerals (CENTRUM SILVER ULTRA WOMENS) TABS Take 1 tablet by mouth every evening.     . Potassium Chloride ER 20 MEQ TBCR Take 20 mEq by mouth 2  (two) times daily. 180 tablet 3  . rivaroxaban (XARELTO) 20 MG TABS tablet Take 1 tablet (20 mg total) by mouth daily with supper. 30 tablet 0  . sodium chloride (OCEAN) 0.65 % nasal spray Place 2 sprays into the nose daily. After shower    . trolamine salicylate (ASPERCREME) 10 % cream Apply 1 application topically daily.    . calcium-vitamin D (OSCAL WITH D) 500-200 MG-UNIT per tablet Take 1 tablet by mouth every evening.     . hydrALAZINE (APRESOLINE) 10 MG tablet 2 tabs po tid (Patient not taking: Reported on 11/07/2017) 180 tablet 0   No facility-administered medications prior to visit.     Allergies  Allergen Reactions  . Augmentin [Amoxicillin-Pot  Clavulanate] Nausea And Vomiting and Other (See Comments)    "projectile vomiting" Has patient had a PCN reaction causing immediate rash, facial/tongue/throat swelling, SOB or lightheadedness with hypotension:No Has patient had a PCN reaction causing severe rash involving mucus membranes or skin necrosis:No Has patient had a PCN reaction that required hospitalization:No Has patient had a PCN reaction occurring within the last 10 years:Yes If all of the above answers are "NO", then may proceed with Cephalosporin use.     ROS As per HPI  PE: Blood pressure (!) 156/69, pulse (!) 57, temperature 97.9 F (36.6 C), temperature source Oral, resp. rate 16, height 5' (1.524 m), weight 166 lb 4 oz (75.4 kg), SpO2 93 %. Gen: Alert, well appearing.  Patient is oriented to person, place, time, and situation. AFFECT: pleasant, lucid thought and speech. CV: RRR, no m/r/g.   LUNGS: CTA bilat, nonlabored resps, good aeration in all lung fields. EXT: no clubbing or cyanosis.  no edema.  Pt is wearing compression stockings.    LABS:    Chemistry      Component Value Date/Time   NA 138 10/23/2017 0938   K 4.6 10/23/2017 0938   CL 102 10/23/2017 0938   CO2 25 10/23/2017 0938   BUN 22 10/23/2017 0938   CREATININE 1.23 (H) 10/23/2017 0938       Component Value Date/Time   CALCIUM 9.8 10/23/2017 0938   ALKPHOS 96 10/12/2017 1538   AST 16 10/12/2017 1538   ALT 21 10/12/2017 1538   BILITOT 0.4 10/12/2017 1538     Lab Results  Component Value Date   CHOL 100 09/08/2017   HDL 47.30 07/27/2017   LDLCALC 50 07/27/2017   TRIG 160.0 (H) 07/27/2017   CHOLHDL 3 07/27/2017     IMPRESSION AND PLAN:  1) HTN: good control.  The current medical regimen is effective;  continue present plan and medications.  2) CRI III: Cr stable/lytes stable on recent BMET 10/23/17.  An After Visit Summary was printed and given to the patient.  FOLLOW UP: Return for keep f/u appt arranged for 01/03/18.  Signed:  Crissie Sickles, MD           11/07/2017

## 2017-11-21 ENCOUNTER — Other Ambulatory Visit: Payer: Self-pay | Admitting: Physician Assistant

## 2017-11-21 DIAGNOSIS — M1711 Unilateral primary osteoarthritis, right knee: Secondary | ICD-10-CM | POA: Insufficient documentation

## 2017-11-21 NOTE — Telephone Encounter (Signed)
Rx request sent to pharmacy.  

## 2017-11-22 DIAGNOSIS — Z87442 Personal history of urinary calculi: Secondary | ICD-10-CM

## 2017-11-22 DIAGNOSIS — Z955 Presence of coronary angioplasty implant and graft: Secondary | ICD-10-CM

## 2017-11-22 DIAGNOSIS — K5790 Diverticulosis of intestine, part unspecified, without perforation or abscess without bleeding: Secondary | ICD-10-CM

## 2017-11-22 DIAGNOSIS — Z86010 Personal history of colonic polyps: Secondary | ICD-10-CM

## 2017-11-22 DIAGNOSIS — M1990 Unspecified osteoarthritis, unspecified site: Secondary | ICD-10-CM

## 2017-11-22 DIAGNOSIS — F419 Anxiety disorder, unspecified: Secondary | ICD-10-CM

## 2017-11-22 DIAGNOSIS — I5033 Acute on chronic diastolic (congestive) heart failure: Secondary | ICD-10-CM

## 2017-11-22 DIAGNOSIS — K7460 Unspecified cirrhosis of liver: Secondary | ICD-10-CM

## 2017-11-22 DIAGNOSIS — I252 Old myocardial infarction: Secondary | ICD-10-CM

## 2017-11-22 DIAGNOSIS — Z951 Presence of aortocoronary bypass graft: Secondary | ICD-10-CM

## 2017-11-22 DIAGNOSIS — N182 Chronic kidney disease, stage 2 (mild): Secondary | ICD-10-CM

## 2017-11-22 DIAGNOSIS — Z9181 History of falling: Secondary | ICD-10-CM

## 2017-11-22 DIAGNOSIS — I130 Hypertensive heart and chronic kidney disease with heart failure and stage 1 through stage 4 chronic kidney disease, or unspecified chronic kidney disease: Secondary | ICD-10-CM

## 2017-11-22 DIAGNOSIS — Z8701 Personal history of pneumonia (recurrent): Secondary | ICD-10-CM

## 2017-11-22 DIAGNOSIS — I480 Paroxysmal atrial fibrillation: Secondary | ICD-10-CM | POA: Diagnosis not present

## 2017-11-22 DIAGNOSIS — E785 Hyperlipidemia, unspecified: Secondary | ICD-10-CM

## 2017-11-22 DIAGNOSIS — K7581 Nonalcoholic steatohepatitis (NASH): Secondary | ICD-10-CM

## 2017-11-22 DIAGNOSIS — I4892 Unspecified atrial flutter: Secondary | ICD-10-CM

## 2017-11-22 DIAGNOSIS — Z7901 Long term (current) use of anticoagulants: Secondary | ICD-10-CM

## 2017-11-22 DIAGNOSIS — H539 Unspecified visual disturbance: Secondary | ICD-10-CM

## 2017-11-27 ENCOUNTER — Telehealth: Payer: Self-pay | Admitting: Family Medicine

## 2017-11-27 MED ORDER — FUROSEMIDE 40 MG PO TABS: 40.0000 mg | ORAL_TABLET | Freq: Every day | ORAL | 6 refills | Status: DC

## 2017-11-27 NOTE — Telephone Encounter (Signed)
Copied from [place] [id number]. Topic: Quick Communication - Rx Refill/Question >> Nov 27, 2017 12:32 PM Scherrie Gerlach wrote: Medication: furosemide (LASIX) 40 MG tablet  Pharmacy calling to clarify this Rx has been changed to 1 a day.  Pt switching to [facility] and the transfer of this Rx came over as BID.  Pt is telling pharmacy this was changed when she was in [facility] rehab. Please call back to clarify Fred[facility] Dine, [facility] - [place], [place] - [address] [phone] (Phone) [phone] (Fax)

## 2017-11-27 NOTE — Telephone Encounter (Signed)
This rx should be: furosemide 40 mg, 1 tab po qd, #30, RF x 6..  Thx.

## 2017-11-27 NOTE — Telephone Encounter (Signed)
Please advise. Thanks.  

## 2017-11-27 NOTE — Telephone Encounter (Signed)
Rx sent as directed.

## 2017-12-11 ENCOUNTER — Other Ambulatory Visit: Payer: Self-pay | Admitting: *Deleted

## 2017-12-11 MED ORDER — ALPRAZOLAM 0.5 MG PO TABS: ORAL_TABLET | ORAL | 5 refills | Status: DC

## 2017-12-11 NOTE — Telephone Encounter (Signed)
RF request for alprazolam LOV: 11/07/17 Next ov: 01/03/18 Last written: 05/08/17 #90 w/ 5RF  Please advise. Thanks.

## 2017-12-25 ENCOUNTER — Telehealth: Payer: Self-pay | Admitting: Internal Medicine

## 2017-12-25 NOTE — Telephone Encounter (Signed)
Spoke to patient  Informed her  To take as prescribed 100 mg metoprolol  Twice a day . Continue taking 40 mg furosemide daily. Patient states  Blood pressure arrange is bwtn 130-140 , she has not paid attention to heart rate  RN  Informed watch Heart rate,Blood pressure -   Rate should be between 60- 100 beats per min. Notify office if any changes.  patient  Verbalized understanding.

## 2017-12-25 NOTE — Telephone Encounter (Signed)
LEFT MESSAGE TO CALL BACK -  UNABLE TO LEAVE MESSAGE ON CELL PHONE.--  PER LAST  TELEPHONE MESSAGE  PATIENT S METOPROLOL 100 MG  TWICE A DAY .  IF  QUESTION MAY NEED AN APPOINTMENT

## 2017-12-25 NOTE — Telephone Encounter (Signed)
° ° °  Pt c/o medication issue:  1. Name of Medication: furosemide (LASIX) 40 MG tablet, metoprolol tartrate (LOPRESSOR) 100 MG tablet  2. How are you currently taking this medication (dosage and times per day)? N/A  3. Are you having a reaction (difficulty breathing--STAT)? NO  4. What is your medication issue? Patient needs clarification on medication instructions. Patient states she has only been taking metoprolol 1 time a day

## 2018-01-03 ENCOUNTER — Encounter: Payer: Self-pay | Admitting: Family Medicine

## 2018-01-03 ENCOUNTER — Ambulatory Visit: Payer: Medicare Other | Admitting: Family Medicine

## 2018-01-03 ENCOUNTER — Encounter: Payer: Self-pay | Admitting: *Deleted

## 2018-01-03 VITALS — BP 159/71 | HR 55 | Temp 97.8°F | Resp 16 | Ht 60.0 in | Wt 167.5 lb

## 2018-01-03 DIAGNOSIS — E7800 Pure hypercholesterolemia, unspecified: Secondary | ICD-10-CM

## 2018-01-03 DIAGNOSIS — N183 Chronic kidney disease, stage 3 unspecified: Secondary | ICD-10-CM

## 2018-01-03 DIAGNOSIS — I480 Paroxysmal atrial fibrillation: Secondary | ICD-10-CM

## 2018-01-03 DIAGNOSIS — I10 Essential (primary) hypertension: Secondary | ICD-10-CM

## 2018-01-03 DIAGNOSIS — I5032 Chronic diastolic (congestive) heart failure: Secondary | ICD-10-CM

## 2018-01-03 LAB — COMPREHENSIVE METABOLIC PANEL
ALT: 24 U/L (ref 0–35)
AST: 22 U/L (ref 0–37)
Albumin: 4.2 g/dL (ref 3.5–5.2)
Alkaline Phosphatase: 68 U/L (ref 39–117)
BUN: 24 mg/dL — AB (ref 6–23)
CHLORIDE: 105 meq/L (ref 96–112)
CO2: 28 meq/L (ref 19–32)
Calcium: 9.3 mg/dL (ref 8.4–10.5)
Creatinine, Ser: 1.38 mg/dL — ABNORMAL HIGH (ref 0.40–1.20)
GFR: 39.01 mL/min — ABNORMAL LOW (ref >60.00)
GLUCOSE: 131 mg/dL — AB (ref 70–99)
POTASSIUM: 4.1 meq/L (ref 3.5–5.1)
Sodium: 143 mEq/L (ref 135–145)
Total Bilirubin: 0.6 mg/dL (ref 0.2–1.2)
Total Protein: 6.9 g/dL (ref 6.0–8.3)

## 2018-01-03 NOTE — Progress Notes (Signed)
OFFICE VISIT  01/03/2018   CC:  Chief Complaint  Patient presents with  . Follow-up    RCI, pt is not fasting.    HPI:    Patient is a 82 y.o. Caucasian female who presents for 2 mo f/u HTN, HLD, GAD, and CRI III.   She has a-fib and chronic diastolic HF and is followed by Dr. Debara Pickett. In review of meds she seems to be taking too much amiodarone (400 mg bid !)  We discussed Dr. Lysbeth Penner last note/recommendations regarding dosing of this med (decrease to 200 mg qd).    In general , all is going ok. She's moving to [facility] pretty soon and seems optimistic about this. Anxiety level pretty stable, taking alpraz prn, no panic attacks lately.  Home bp monitoring consistently <130/80. Taking statin daily w/out problem. Trying to drink plenty of fluids.  She avoids NSAIDs.  ROS: no CP, no SOB, no wheezing, no cough, no dizziness, no HAs, no rashes, no melena/hematochezia.  No polyuria or polydipsia.  No myalgias or arthralgias.   Past Medical History:  Diagnosis Date  . Abnormal EKG approx 2008   Nuclear stress test neg;   . Anxiety    with panic  . Arthritis   . Autoimmune hepatitis ([place])   . CAP (community acquired pneumonia) 08/2017   Hospitalization for CAP/acute diast HF/rapid a-fib  . Cataract    s/p surgery--lens implants  . Chronic diastolic heart failure ([place]) 2019  . Chronic renal insufficiency, stage II (mild) 12/04/2012   GFR 60s.  Renal u/s when in hosp 08/2017 for CAP/CHF showed symmetric kidneys, echogenicity normal, w/out hydronephrosis.  . Cirrhosis ([place])   . Diverticulosis 2009  . Fracture of radial shaft, left, closed 11/16/10   fell down flight of stairs  . History of kidney stones   . Hx of adenomatous colonic polyps 2002   surveillance colonoscopy 2009, +polypectomy done-tubular adenoma w/out high grade.  05/2013 tubular adenomas--recall 3 yrs  . Hyperlipidemia   . Hypertension   . Low TSH level 02/18/2016   T3 norm, T4 mildly  elevated--suspected sick euthyroid syndrome.  Repeat labs 06/2016: normal.  . NASH (nonalcoholic steatohepatitis)   . Osteopenia    DEXA 08/2010; repeat DEXA 02/2015 worse: fosamax started  . PAF (paroxysmal atrial fibrillation) ([place]) 02/2016   when in post-op for ankle surgery; spontaneously converted in hosp, seen by Dr. Debara Pickett in consultation--metoprolol rate control + xarelto recommended.  Metop d/c due to hypot.  Plan to cont xarelto 20 mg qd indef due to CHAD-VASc score of 3.  A-fib w/RVR and CHF 07/2017; pt placed on amiodarone and plan for CV, but pt was in sinus rhythm when she went in for her DC CV, so she was sent home.  . Peripheral edema   . Pneumonia 2015   hx  . Rheumatic fever     Past Surgical History:  Procedure Laterality Date  . APPENDECTOMY  1966   done during surgery for tubal pregnancy  . CATARACT EXTRACTION W/ INTRAOCULAR LENS IMPLANT  2013   bilat  . COLONOSCOPY W/ POLYPECTOMY  05/2013   +diverticulosis; recall 3 yrs (Dr. Henrene Pastor)  . DEXA  02/2015   T score -2.1 in both femoral necks; FRAX 10 yr risk of major osteoporotic fracture was 21%---fosamax started  . EYE SURGERY    . LEFT HEART CATH AND CORONARY ANGIOGRAPHY N/A 09/01/2017   No angiographically significant CAD.  Upper normal left ventricular filling pressure.  Procedure: LEFT HEART  CATH AND CORONARY ANGIOGRAPHY;  Surgeon: Nelva Bush, MD;  Location: [facility];  Service: Cardiovascular;  Laterality: N/A;  . OPEN REDUCTION INTERNAL FIXATION (ORIF) TIBIA/FIBULA FRACTURE Left 02/18/2016   Procedure: OPEN REDUCTION INTERNAL FIXATION (ORIF) Right ankle trimalleolar fracture;  Surgeon: Wylene Simmer, MD;  Location: [facility];  Service: Orthopedics;  Laterality: Left;  requests 66mns  . ORIF RADIAL FRACTURE  11/18/10   left; s/p slip on slippery floor and fell  . THORACENTESIS  08/2017   diagnostic and therapeutic.  Transudative.  Clx neg.  (+pulm edema/diastolic HF)  . TONSILLECTOMY    . TRANSTHORACIC  ECHOCARDIOGRAM  02/18/2016; 08/10/17   LVEF of 55-60%, mild AI and mild MR and normal biatrial size.  07/2017--normal LV function, mild enlarge aortic root, mild/mod TR, bilat atrial enlargement.    Outpatient Medications Prior to Visit  Medication Sig Dispense Refill  . acetaminophen (TYLENOL) 325 MG tablet Take 650 mg by mouth every 6 (six) hours as needed for moderate pain or headache.     . albuterol (PROAIR HFA) 108 (90 Base) MCG/ACT inhaler INHALE 2 PUFFS INTO THE LUNGS EVERY 4 HOURS IF NEEDED FOR WHEEZING OR SHORTNESS OF BREATH(COUGH SHORTNESS OF BREATH AND WHEEZING) (Patient taking differently: Inhale 2 puffs into the lungs every 4 (four) hours as needed for wheezing or shortness of breath. ) 8.5 g 1  . alendronate (FOSAMAX) 70 MG tablet Take 1 tablet (70 mg total) by mouth every 7 (seven) days. Take with a full glass of water on an empty stomach. (Patient taking differently: Take 70 mg by mouth every Monday. Take with a full glass of water on an empty stomach.) 4 tablet 11  . ALPRAZolam (XANAX) 0.5 MG tablet TAKE 1 TABLET BY MOUTH THREE TIMES DAILY IF NEEDED FOR STRESS 90 tablet 5  . amiodarone (PACERONE) 200 MG tablet TAKE 2 TABLETS(400 MG) BY MOUTH TWICE DAILY 120 tablet 0  . atorvastatin (LIPITOR) 40 MG tablet TAKE 1 TABLET BY MOUTH ONCE DAILY 90 tablet 1  . Calcium Carbonate (CALCIUM 600 PO) Take 600 mg by mouth daily.    . carboxymethylcellulose (REFRESH PLUS) 0.5 % SOLN Place 2 drops into both eyes daily.    . fluticasone (FLONASE) 50 MCG/ACT nasal spray Place 2 sprays into both nostrils daily.     . furosemide (LASIX) 40 MG tablet Take 1 tablet (40 mg total) by mouth daily. 30 tablet 6  . hydrALAZINE (APRESOLINE) 10 MG tablet Take 2 tablets (20 mg total) by mouth 3 (three) times daily. 180 tablet 0  . metoprolol tartrate (LOPRESSOR) 100 MG tablet Take 1 tablet (100 mg total) by mouth 2 (two) times daily. 180 tablet 3  . Multiple Vitamins-Minerals (AIRBORNE PO) Take 1 tablet by mouth  daily as needed (for vacation).     . Multiple Vitamins-Minerals (CENTRUM SILVER ULTRA WOMENS) TABS Take 1 tablet by mouth every evening.     . Potassium Chloride ER 20 MEQ TBCR Take 20 mEq by mouth 2 (two) times daily. 180 tablet 3  . rivaroxaban (XARELTO) 20 MG TABS tablet Take 1 tablet (20 mg total) by mouth daily with supper. 30 tablet 0  . sodium chloride (OCEAN) 0.65 % nasal spray Place 2 sprays into the nose daily. After shower    . trolamine salicylate (ASPERCREME) 10 % cream Apply 1 application topically daily.    .Marland Kitchenamiodarone (PACERONE) 200 MG tablet Take 200 mg by mouth every morning.      No facility-administered medications prior to visit.  Allergies  Allergen Reactions  . Augmentin [Amoxicillin-Pot Clavulanate] Nausea And Vomiting and Other (See Comments)    "projectile vomiting" Has patient had a PCN reaction causing immediate rash, facial/tongue/throat swelling, SOB or lightheadedness with hypotension:No Has patient had a PCN reaction causing severe rash involving mucus membranes or skin necrosis:No Has patient had a PCN reaction that required hospitalization:No Has patient had a PCN reaction occurring within the last 10 years:Yes If all of the above answers are "NO", then may proceed with Cephalosporin use.     ROS As per HPI  PE: Blood pressure (!) 159/71, pulse (!) 55, temperature 97.8 F (36.6 C), temperature source Oral, resp. rate 16, height 5' (1.524 m), weight 167 lb 8 oz (76 kg), SpO2 93 %. Gen: Alert, well appearing.  Patient is oriented to person, place, time, and situation. AFFECT: pleasant, lucid thought and speech. CBS:WHQP: no injection, icteris, swelling, or exudate.  EOMI, PERRLA. Mouth: lips without lesion/swelling.  Oral mucosa pink and moist. Oropharynx without erythema, exudate, or swelling.  CV: RRR, no m/r/g.   LUNGS: CTA bilat, nonlabored resps, good aeration in all lung fields. EXT: no clubbing or cyanosis.  no edema.    LABS:     Chemistry      Component Value Date/Time   NA 138 10/23/2017 0938   K 4.6 10/23/2017 0938   CL 102 10/23/2017 0938   CO2 25 10/23/2017 0938   BUN 22 10/23/2017 0938   CREATININE 1.23 (H) 10/23/2017 0938      Component Value Date/Time   CALCIUM 9.8 10/23/2017 0938   ALKPHOS 96 10/12/2017 1538   AST 16 10/12/2017 1538   ALT 21 10/12/2017 1538   BILITOT 0.4 10/12/2017 1538     Lab Results  Component Value Date   CHOL 100 09/08/2017   HDL 47.30 07/27/2017   LDLCALC 50 07/27/2017   TRIG 160.0 (H) 07/27/2017   CHOLHDL 3 07/27/2017   Lab Results  Component Value Date   TSH 1.570 10/12/2017   Lab Results  Component Value Date   WBC 7.5 10/13/2017   HGB 12.4 10/13/2017   HCT 41.0 10/13/2017   MCV 95.3 10/13/2017   PLT 242 10/13/2017    IMPRESSION AND PLAN:  1) HTN: The current medical regimen is effective;  continue present plan and medications. Lytes/cr today.  2) HLD: tolerating statin.  Excellent lipid panel 5 mo ago.  Hepatic panel today.  3) GAD: stable.  Continue alprazolam.  4) CRI stage III: avoiding NSAIDs.  Hydrating adequately. Lytes normal and sCr stable about 2 mo ago. BMET today.  5) A-fib and chronic diastolic HF: Reviewed amiodarone dosing--as per Dr. Lysbeth Penner last f/u note she should be taking 1/2 of a 400 mg amiodarone tab once a day.  We made this as clear to her today as possible.   Continue lasix 40 mg.  Continue metoprolol and xarelto. If GFR not > 50 ml/min today we'll decrease her xarelto dose to 15 mg qd.  An After Visit Summary was printed and given to the patient.  FOLLOW UP: 4 mo  Signed:  Crissie Sickles, MD           01/03/2018

## 2018-01-09 ENCOUNTER — Other Ambulatory Visit: Payer: Self-pay

## 2018-01-09 MED ORDER — RIVAROXABAN 15 MG PO TABS: 15.0000 mg | ORAL_TABLET | Freq: Two times a day (BID) | ORAL | 5 refills | Status: DC

## 2018-01-09 NOTE — Telephone Encounter (Signed)
Brenda Matthews called to let pt know that we will refill but that there will be a change in mg

## 2018-01-15 ENCOUNTER — Other Ambulatory Visit: Payer: Self-pay | Admitting: *Deleted

## 2018-01-15 MED ORDER — HYDRALAZINE HCL 10 MG PO TABS: 20.0000 mg | ORAL_TABLET | Freq: Three times a day (TID) | ORAL | 3 refills | Status: DC

## 2018-01-20 ENCOUNTER — Other Ambulatory Visit: Payer: Self-pay | Admitting: Family Medicine

## 2018-01-24 ENCOUNTER — Other Ambulatory Visit: Payer: Self-pay | Admitting: Internal Medicine

## 2018-01-24 ENCOUNTER — Telehealth: Payer: Self-pay | Admitting: Pharmacist

## 2018-01-24 MED ORDER — RIVAROXABAN 15 MG PO TABS: 15.0000 mg | ORAL_TABLET | Freq: Every day | ORAL | 1 refills | Status: DC

## 2018-01-24 NOTE — Telephone Encounter (Signed)
Refill for Xarelto 94m daily sent today.  CrCl = 322mmin (Xarelto 2050maily NOT appropriate.

## 2018-02-20 ENCOUNTER — Telehealth: Payer: Self-pay | Admitting: Family Medicine

## 2018-02-20 NOTE — Telephone Encounter (Signed)
Patient will be bringing in a form from [employer] for refund of airline ticket. This will need to be completed by doctor.

## 2018-02-26 NOTE — Telephone Encounter (Signed)
Noted. Waiting on form.

## 2018-03-14 ENCOUNTER — Telehealth: Payer: Self-pay | Admitting: Family Medicine

## 2018-03-14 NOTE — Telephone Encounter (Signed)
Patient dropped off a form from her insurance company to get a refund for her airfare for a trip that was scheduled August 09, 2017 to [place]. Patient okay with paying a form charge.

## 2018-03-15 NOTE — Telephone Encounter (Signed)
Per note from [place] pts niece will be dropping off the rest of the paperwork. I will hold onto what was dropped of today, waiting on the rest before sending to Dr. Anitra Lauth.

## 2018-03-15 NOTE — Telephone Encounter (Signed)
Routing note back to you.

## 2018-03-15 NOTE — Telephone Encounter (Signed)
Form placed on Dr. Idelle Leech desk for review.

## 2018-03-16 NOTE — Telephone Encounter (Signed)
Received the rest of the paperwork.   All paperwork placed on Dr. Idelle Leech desk for review.

## 2018-03-22 NOTE — Telephone Encounter (Signed)
Form completed and put in box to go up front.

## 2018-03-26 NOTE — Telephone Encounter (Signed)
Patient has picked up the form

## 2018-03-27 ENCOUNTER — Other Ambulatory Visit: Payer: Self-pay

## 2018-03-27 MED ORDER — HYDRALAZINE HCL 10 MG PO TABS: 20.0000 mg | ORAL_TABLET | Freq: Three times a day (TID) | ORAL | 1 refills | Status: DC

## 2018-03-27 MED ORDER — FUROSEMIDE 40 MG PO TABS: 40.0000 mg | ORAL_TABLET | Freq: Every day | ORAL | 1 refills | Status: DC

## 2018-03-28 ENCOUNTER — Other Ambulatory Visit: Payer: Self-pay | Admitting: Family Medicine

## 2018-03-29 ENCOUNTER — Ambulatory Visit: Payer: Medicare Other | Admitting: Family Medicine

## 2018-03-30 ENCOUNTER — Other Ambulatory Visit: Payer: Self-pay | Admitting: Family Medicine

## 2018-03-30 MED ORDER — ALBUTEROL SULFATE HFA 108 (90 BASE) MCG/ACT IN AERS: 2.0000 | INHALATION_SPRAY | RESPIRATORY_TRACT | 1 refills | Status: DC | PRN

## 2018-03-30 MED ORDER — HYDRALAZINE HCL 10 MG PO TABS: 20.0000 mg | ORAL_TABLET | Freq: Three times a day (TID) | ORAL | 3 refills | Status: DC

## 2018-03-30 NOTE — Telephone Encounter (Signed)
Yes.  I went ahead and did this.-thx

## 2018-03-30 NOTE — Telephone Encounter (Signed)
Patient requesting 90 day supply of hydralyzine to be sent to [facility] due to [place] 19.  Okay to send in # [phone] and please advise refills?

## 2018-04-02 ENCOUNTER — Other Ambulatory Visit: Payer: Self-pay | Admitting: Internal Medicine

## 2018-04-04 ENCOUNTER — Other Ambulatory Visit: Payer: Self-pay | Admitting: *Deleted

## 2018-04-04 MED ORDER — ATORVASTATIN CALCIUM 40 MG PO TABS: 40.0000 mg | ORAL_TABLET | Freq: Every day | ORAL | 1 refills | Status: DC

## 2018-04-09 ENCOUNTER — Telehealth: Payer: Self-pay

## 2018-04-10 ENCOUNTER — Ambulatory Visit: Payer: Medicare Other | Admitting: Family Medicine

## 2018-04-10 NOTE — Telephone Encounter (Signed)
COVID-19 Pre-Screening Questions:  Provider: HILTY   Needs f/u  N-3-3/25  COVID-19 Pre-Screening Questions:  . Do you currently have a fever? NO (yes = cancel and refer to pcp for e-visit) .  Marland Kitchen Have you recently travelled on a cruise, internationally, or to [place], [place], [place], [place], [place], or [place], [place] [employer]) ? NO (yes = cancel, stay home, monitor symptoms, and contact pcp or initiate e-visit if symptoms develop) .  Marland Kitchen Have you been in contact with someone that is currently pending confirmation of Covid19 testing or has been confirmed to have the [place] virus?  NO (yes = cancel, stay home, away from tested individual, monitor symptoms, and contact pcp or initiate e-visit if symptoms develop) .  Marland Kitchen Are you currently experiencing fatigue or cough? NO (yes = pt should be prepared to have a mask placed at the time of their visit).  Cardiac Questionnaire:    Since your last visit or hospitalization:    1. Have you been having new or worsening chest pain? NO   2. Have you been having new or worsening shortness of breath? NO 3. Have you been having new or worsening leg swelling, wt gain, or increase in abdominal girth (pants fitting more tightly)? NO   4. Have you had any passing out spells? NO    *A YES to any of these questions would result in the appointment being kept. *If all the answers to these questions are NO, we should indicate that given the current situation regarding the worldwide coronarvirus pandemic, at the recommendation of the CDC, we are looking to limit gatherings in our waiting area, and thus will reschedule their appointment beyond four weeks from today.   _____________

## 2018-04-11 ENCOUNTER — Ambulatory Visit: Payer: Medicare Other | Admitting: Internal Medicine

## 2018-04-16 ENCOUNTER — Telehealth: Payer: Self-pay

## 2018-04-16 ENCOUNTER — Telehealth: Payer: Self-pay | Admitting: Internal Medicine

## 2018-04-16 NOTE — Telephone Encounter (Signed)
I did some paperwork for her regarding her attempts to get reimbursed for an airplane ticket from 2019. I don't know if this was the "insurance thing" she is talking about or not.    I have not seen any paperwork about a living facility yet.-thx

## 2018-04-16 NOTE — Telephone Encounter (Signed)
I believe I placed something on your desk for this patient.  Do you remember signing anything? Please advise.  Copied from [place] [id number]. Topic: General - Inquiry >> Apr 13, 2018  3:20 PM Reyne Dumas L wrote: Reason for CRM:   Pt called and left message on [place] stating that she had 2 concerns: 1.  She is sending a form from [facility] where she will be moving that must be completed by PCP 30 days prior to move in. 2.  She wants to know if the paper for her insurance company is done yet. Pt can be reached at [phone]

## 2018-04-16 NOTE — Telephone Encounter (Signed)
New message   Patient states that she wants to receive the form back where she is expecting a refund for some airline tickets. Patient states that Dr. Debara Pickett filled this information out. Please send this form to the address on file.

## 2018-04-17 NOTE — Telephone Encounter (Signed)
Follow up   Pt is returning call and would like for the paperwork to be mailed to her  [address] [place] [address]

## 2018-04-17 NOTE — Telephone Encounter (Addendum)
Attempted to contact pt to inform paperwork is at the front desk for her to pick. Unable to leave message as mailbox is full.

## 2018-04-17 NOTE — Telephone Encounter (Signed)
Looked through patient's chart and did not see any documentation of receiving any forms from [employer]. Tried to reach patient, unable to LM due to VM being full.

## 2018-04-17 NOTE — Telephone Encounter (Signed)
Pt called and left message on [place].  She would like to know how to pick up form for travel reimbursement.

## 2018-04-18 ENCOUNTER — Other Ambulatory Visit: Payer: Self-pay | Admitting: Family Medicine

## 2018-04-18 NOTE — Telephone Encounter (Signed)
SW patient, denied picking up form. Pt advised forms will be mailed to her instead of pick up by her request.

## 2018-04-18 NOTE — Telephone Encounter (Signed)
SW pt this morning and she has not mailed paperwork for living facility yet but will soon.

## 2018-04-19 NOTE — Telephone Encounter (Signed)
Informed pt that paperwork was placed in the mail yesterday by medical records.

## 2018-04-25 ENCOUNTER — Other Ambulatory Visit: Payer: Self-pay | Admitting: Family Medicine

## 2018-04-25 ENCOUNTER — Telehealth: Payer: Self-pay | Admitting: Family Medicine

## 2018-04-25 NOTE — Telephone Encounter (Signed)
Patient is moving to [facility] 05/30/18. She is going to mail the paperwork. The assisting living facility is going to do her TB skin test so that she doesn't have to come in to our office. Patient requested that if we have any questions to call her cell. She is having trouble with her landline.

## 2018-04-26 NOTE — Telephone Encounter (Signed)
FYI: Will let you know when paperwork is received to review and sign.

## 2018-05-02 ENCOUNTER — Other Ambulatory Visit: Payer: Self-pay | Admitting: Internal Medicine

## 2018-05-03 ENCOUNTER — Telehealth: Payer: Self-pay

## 2018-05-03 NOTE — Telephone Encounter (Signed)
Paperwork has been received in the mail

## 2018-05-03 NOTE — Telephone Encounter (Signed)
Received admission paperwork from pt via mail. Placed on providers desk to sign and review

## 2018-05-04 ENCOUNTER — Ambulatory Visit: Payer: Medicare Other | Admitting: Family Medicine

## 2018-05-04 ENCOUNTER — Encounter: Payer: Self-pay | Admitting: Family Medicine

## 2018-05-04 NOTE — Telephone Encounter (Signed)
Paperwork completed/envelope put on your desk (needs photocopied still).-thx

## 2018-05-04 NOTE — Telephone Encounter (Signed)
Duplicate message--paperwork completed today and put on Brittanae Staton, CMA's desk. Signed:  Crissie Sickles, MD           05/04/2018

## 2018-05-07 NOTE — Telephone Encounter (Signed)
Copy was made of pt's forms for chart and placed in the bin to go up front.

## 2018-05-15 ENCOUNTER — Other Ambulatory Visit: Payer: Self-pay | Admitting: Family Medicine

## 2018-05-16 NOTE — Telephone Encounter (Signed)
RF request for xanax LOV: 01/03/2018  Next ov: 06/14/2018 Last written: 12/11/17 #90 x5 refills   Please advise

## 2018-05-18 ENCOUNTER — Telehealth: Payer: Self-pay | Admitting: Internal Medicine

## 2018-05-18 NOTE — Telephone Encounter (Signed)
Returned call to patient. She states she is aware our office is doing virtual visits. She would like to change her 5/14 visit to a virtual video visit with Dr. Debara Pickett. She has a smart phone and will have her med list and VS info available on this day.

## 2018-05-18 NOTE — Telephone Encounter (Signed)
New Message:    Patient calling concering her appt. Please call patient back.

## 2018-05-29 ENCOUNTER — Telehealth: Payer: Self-pay | Admitting: Internal Medicine

## 2018-05-29 NOTE — Telephone Encounter (Signed)
Mychart sent via email, smartphone, consent, pre reg complete 05/29/18 AF

## 2018-05-31 ENCOUNTER — Telehealth: Payer: Self-pay | Admitting: Internal Medicine

## 2018-05-31 ENCOUNTER — Encounter: Payer: Self-pay | Admitting: Internal Medicine

## 2018-05-31 ENCOUNTER — Telehealth (INDEPENDENT_AMBULATORY_CARE_PROVIDER_SITE_OTHER): Payer: Medicare Other | Admitting: Internal Medicine

## 2018-05-31 VITALS — Ht 62.0 in | Wt 162.0 lb

## 2018-05-31 DIAGNOSIS — I10 Essential (primary) hypertension: Secondary | ICD-10-CM | POA: Diagnosis not present

## 2018-05-31 DIAGNOSIS — I5032 Chronic diastolic (congestive) heart failure: Secondary | ICD-10-CM | POA: Diagnosis not present

## 2018-05-31 DIAGNOSIS — I480 Paroxysmal atrial fibrillation: Secondary | ICD-10-CM | POA: Diagnosis not present

## 2018-05-31 DIAGNOSIS — N182 Chronic kidney disease, stage 2 (mild): Secondary | ICD-10-CM

## 2018-05-31 DIAGNOSIS — E782 Mixed hyperlipidemia: Secondary | ICD-10-CM

## 2018-05-31 DIAGNOSIS — Z79899 Other long term (current) drug therapy: Secondary | ICD-10-CM

## 2018-05-31 NOTE — Patient Instructions (Addendum)
Medication Instructions:  Your physician has recommended you make the following change in your medication: DECREASE amiodarone to 188m daily  If you need a refill on your cardiac medications before your next appointment, please call your pharmacy.   Lab work: CBC, BMET, Thyroid testing (TSH, free T4) - non-fasting labs These can be done at any [facility] convenient for you Dr. HLysbeth Penneroffice has a lab that is open Monday-Friday from 8-4:30pm If your new place of [place], [facility] can do the lab work that is OK as well Please fax results to [phone] If you have labs (blood work) drawn today and your tests are completely normal, you will receive your results only by: .Marland KitchenMyChart Message (if you have MyChart) OR . A paper copy in the mail If you have any lab test that is abnormal or we need to change your treatment, we will call you to review the results.  Testing/Procedures: NONE  Follow-Up: [facility] you and your health needs are our priority.  As part of our continuing mission to provide you with exceptional heart care, we have created designated Provider Care Teams.  These Care Teams include your primary Cardiologist (physician) and Advanced Practice Providers (APPs -  Physician Assistants and Nurse Practitioners) who all work together to provide you with the care you need, when you need it. You will need a follow up appointment in 6 months.  Please call our office 2 months in advance to schedule this appointment.  You may see KPixie Casino MD or one of the following Advanced Practice Providers on your designated Care Team: [place]. AFabian Sharp PA-C  Any Other Special Instructions Will Be Listed Below (If Applicable).

## 2018-05-31 NOTE — Progress Notes (Signed)
Virtual Visit via Telephone Note   This visit type was conducted due to national recommendations for restrictions regarding the COVID-19 Pandemic (e.g. social distancing) in an effort to limit this patient's exposure and mitigate transmission in our community.  Due to her co-morbid illnesses, this patient is at least at moderate risk for complications without adequate follow up.  This format is felt to be most appropriate for this patient at this time.  The patient did not have access to video technology/had technical difficulties with video requiring transitioning to audio format only (telephone).  All issues noted in this document were discussed and addressed.  No physical exam could be performed with this format.  Please refer to the patient's chart for her  consent to telehealth for [facility].   Evaluation Performed:  Telephone visit  Date:  05/31/2018   ID:  Brenda Matthews, DOB 1935-02-01, MRN [id number]  Patient Location:  [place] [address]  Provider location:   [address], [address] [place], [place] [address]  PCP:  Brenda Sou, MD  Cardiologist:  Brenda Casino, MD Electrophysiologist:  None   Chief Complaint:  No complaints  History of Present Illness:    Brenda Matthews is a 83 y.o. female who presents via audio/video conferencing for a telehealth visit today.  Brenda Matthews was seen today in telephone follow-up.  Overall she seems to be doing well.  She denies any chest pain or shortness of breath.  She has had no symptomatic A. fib.  She does get tired from time to time.  She is under a lot of stress as they are currently moving to [facility].  She notes that she will have to be quarantined when she moves there for up to 2weeks due to the fact that it is a closed environment with an older population in the face of the COVID-19 pandemic.  She denies any bleeding issues of note but does occasionally get some scant blood when she has  straining for bowel movement.  I suspect this is likely hemorrhoidal bleeding.  The patient does not have symptoms concerning for COVID-19 infection (fever, chills, cough, or new SHORTNESS OF BREATH).    Prior CV studies:   The following studies were reviewed today:  Chart review  PMHx:  Past Medical History:  Diagnosis Date  . Abnormal EKG approx 2008   Nuclear stress test neg;   . Anxiety    with panic  . Arthritis   . Autoimmune hepatitis ([place])   . CAP (community acquired pneumonia) 08/2017   Hospitalization for CAP/acute diast HF/rapid a-fib  . Cataract    s/p surgery--lens implants  . Chronic diastolic heart failure ([place]) 2019  . Chronic renal insufficiency, stage II (mild) 12/04/2012   GFR 60s.  Renal u/s when in hosp 08/2017 for CAP/CHF showed symmetric kidneys, echogenicity normal, w/out hydronephrosis.  . Cirrhosis ([place])   . Diverticulosis 2009  . Fracture of radial shaft, left, closed 11/16/10   fell down flight of stairs  . History of kidney stones   . Hx of adenomatous colonic polyps 2002   surveillance colonoscopy 2009, +polypectomy done-tubular adenoma w/out high grade.  05/2013 tubular adenomas--recall 3 yrs  . Hyperlipidemia   . Hypertension   . Low TSH level 02/18/2016   T3 norm, T4 mildly elevated--suspected sick euthyroid syndrome.  Repeat labs 06/2016: normal.  . NASH (nonalcoholic steatohepatitis)   . Osteopenia    DEXA 08/2010; repeat DEXA 02/2015 worse:  fosamax started  . PAF (paroxysmal atrial fibrillation) ([place]) 02/2016   when in post-op for ankle surgery; spontaneously converted in hosp, seen by Dr. Debara Pickett in consultation--metoprolol rate control + xarelto recommended.  Metop d/c due to hypot.  Plan to cont xarelto 20 mg qd indef due to CHAD-VASc score of 3.  A-fib w/RVR and CHF 07/2017; pt placed on amiodarone and plan for CV, but pt was in sinus rhythm when she went in for her DC CV, so she was sent home.  . Peripheral edema   . Pneumonia 2015   hx  .  Rheumatic fever     Past Surgical History:  Procedure Laterality Date  . APPENDECTOMY  1966   done during surgery for tubal pregnancy  . CATARACT EXTRACTION W/ INTRAOCULAR LENS IMPLANT  2013   bilat  . COLONOSCOPY W/ POLYPECTOMY  05/2013   +diverticulosis; recall 3 yrs (Dr. Henrene Pastor)  . DEXA  02/2015   T score -2.1 in both femoral necks; FRAX 10 yr risk of major osteoporotic fracture was 21%---fosamax started  . EYE SURGERY    . LEFT HEART CATH AND CORONARY ANGIOGRAPHY N/A 09/01/2017   No angiographically significant CAD.  Upper normal left ventricular filling pressure.  Procedure: LEFT HEART CATH AND CORONARY ANGIOGRAPHY;  Surgeon: Nelva Bush, MD;  Location: [facility];  Service: Cardiovascular;  Laterality: N/A;  . OPEN REDUCTION INTERNAL FIXATION (ORIF) TIBIA/FIBULA FRACTURE Left 02/18/2016   Procedure: OPEN REDUCTION INTERNAL FIXATION (ORIF) Right ankle trimalleolar fracture;  Surgeon: Wylene Simmer, MD;  Location: [facility];  Service: Orthopedics;  Laterality: Left;  requests 47mns  . ORIF RADIAL FRACTURE  11/18/10   left; s/p slip on slippery floor and fell  . THORACENTESIS  08/2017   diagnostic and therapeutic.  Transudative.  Clx neg.  (+pulm edema/diastolic HF)  . TONSILLECTOMY    . TRANSTHORACIC ECHOCARDIOGRAM  02/18/2016; 08/10/17   LVEF of 55-60%, mild AI and mild MR and normal biatrial size.  07/2017--normal LV function, mild enlarge aortic root, mild/mod TR, bilat atrial enlargement.    FAMHx:  Family History  Problem Relation Age of Onset  . Heart disease Mother   . Heart disease Father   . Hypertension Brother   . Diabetes Sister   . Colon cancer Neg Hx   . Pancreatic cancer Neg Hx   . Rectal cancer Neg Hx   . Stomach cancer Neg Hx     SOCHx:   reports that she has never smoked. She has never used smokeless tobacco. She reports current alcohol use. She reports that she does not use drugs.  ALLERGIES:  Allergies  Allergen Reactions  . Augmentin  [Amoxicillin-Pot Clavulanate] Nausea And Vomiting and Other (See Comments)    "projectile vomiting" Has patient had a PCN reaction causing immediate rash, facial/tongue/throat swelling, SOB or lightheadedness with hypotension:No Has patient had a PCN reaction causing severe rash involving mucus membranes or skin necrosis:No Has patient had a PCN reaction that required hospitalization:No Has patient had a PCN reaction occurring within the last 10 years:Yes If all of the above answers are "NO", then may proceed with Cephalosporin use.     MEDS:  Current Meds  Medication Sig  . acetaminophen (TYLENOL) 325 MG tablet Take 650 mg by mouth every 6 (six) hours as needed for moderate pain or headache.   . albuterol (PROVENTIL HFA;VENTOLIN HFA) 108 (90 Base) MCG/ACT inhaler Inhale 2 puffs into the lungs every 4 (four) hours as needed for wheezing or shortness of breath.  .Marland Kitchen  ALPRAZolam (XANAX) 0.5 MG tablet TAKE 1 TABLET BY MOUTH THREE TIMES DAILY IF NEEDED FOR STRESS  . amiodarone (PACERONE) 200 MG tablet Take 100 mg by mouth daily.   Marland Kitchen atorvastatin (LIPITOR) 40 MG tablet Take 1 tablet (40 mg total) by mouth daily.  . Calcium Carbonate (CALCIUM 600 PO) Take 600 mg by mouth daily.  . carboxymethylcellulose (REFRESH PLUS) 0.5 % SOLN Place 2 drops into both eyes daily.  Marland Kitchen docusate sodium (COLACE) 100 MG capsule Take 100 mg by mouth 2 (two) times daily.  . fluticasone (FLONASE) 50 MCG/ACT nasal spray Place 2 sprays into both nostrils daily.   . furosemide (LASIX) 40 MG tablet Take 1 tablet (40 mg total) by mouth daily.  . hydrALAZINE (APRESOLINE) 10 MG tablet Take 2 tablets (20 mg total) by mouth 3 (three) times daily.  . metoprolol tartrate (LOPRESSOR) 100 MG tablet Take 1 tablet (100 mg total) by mouth 2 (two) times daily.  . Multiple Vitamins-Minerals (AIRBORNE PO) Take 1 tablet by mouth daily as needed (for vacation).   . Multiple Vitamins-Minerals (CENTRUM SILVER ULTRA WOMENS) TABS Take 1 tablet by  mouth every evening.   . Potassium Chloride ER 20 MEQ TBCR Take 20 mEq by mouth 2 (two) times daily.  . potassium chloride SA (K-DUR,KLOR-CON) 20 MEQ tablet TAKE 1 TABLET BY MOUTH 2 TIMES DAILY  . XARELTO 15 MG TABS tablet TAKE 1 TABLET BY MOUTH EVERY DAY WITH SUPPER     ROS: Pertinent items noted in HPI and remainder of comprehensive ROS otherwise negative.  Labs/Other Tests and Data Reviewed:    Recent Labs: 08/04/2017: Pro B Natriuretic peptide (BNP) 574.0 08/31/2017: Magnesium 2.0 09/06/2017: B Natriuretic Peptide 654.9 10/12/2017: TSH 1.570 10/13/2017: Hemoglobin 12.4; Platelets 242 01/03/2018: ALT 24; BUN 24; Creatinine, Ser 1.38; Potassium 4.1; Sodium 143   Recent Lipid Panel Lab Results  Component Value Date/Time   CHOL 100 09/08/2017 12:09 PM   TRIG 160.0 (H) 07/27/2017 08:54 AM   HDL 47.30 07/27/2017 08:54 AM   CHOLHDL 3 07/27/2017 08:54 AM   LDLCALC 50 07/27/2017 08:54 AM    Wt Readings from Last 3 Encounters:  05/31/18 162 lb (73.5 kg)  01/03/18 167 lb 8 oz (76 kg)  11/07/17 166 lb 4 oz (75.4 kg)     Exam:    Vital Signs:  Ht 5' 2"  (1.575 m)   Wt 162 lb (73.5 kg)   LMP  (LMP Unknown)   BMI 29.63 kg/m    General appearance: alert and no distress Lungs: No visual respiratory difficulty Extremities: extremities normal, atraumatic, no cyanosis or edema Skin: Skin color, texture, turgor normal. No rashes or lesions Neurologic: Mental status: Alert, oriented, thought content appropriate Psych: Pleasant  ASSESSMENT & PLAN:    1. Paroxysmal atrial fibrillation-CHADSVASC score of 3 2. History of NSTEMI with mild, non-obstructive CAD by cath (08/2017) 3. Hypertension-controlled 4. Family history of stroke  Brenda Matthews is doing well without any recurrent symptomatic A. fib that she is aware of.  She denies any chest pain.  Her blood pressure recently had been controlled however currently she does not have a blood pressure cuff since she is in the process of moving.   She has been on declining doses of amiodarone due to some elevation in her TSH.  We will need to repeat labs but I would recommend decreasing her amiodarone further to 100 mg daily.  She remains on this as she does have severe atrial enlargement.  She is anticoagulated on Xarelto  at 15 mg daily, reduced dose due to decreased GFR.  We will reassess her GFR to see if this needs to be readjusted.  Plan follow-up with me in 6 months or sooner as necessary.  COVID-19 Education: The signs and symptoms of COVID-19 were discussed with the patient and how to seek care for testing (follow up with PCP or arrange E-visit).  The importance of social distancing was discussed today.  Patient Risk:   After full review of this patients clinical status, I feel that they are at least moderate risk at this time.  Time:   Today, I have spent 25 minutes with the patient with telehealth technology discussing A. fib, anticoagulation, scant blood with bowel movements, medication management.     Medication Adjustments/Labs and Tests Ordered: Current medicines are reviewed at length with the patient today.  Concerns regarding medicines are outlined above.   Tests Ordered: Orders Placed This Encounter  Procedures  . CBC  . Basic metabolic panel  . TSH  . T4, free    Medication Changes: No orders of the defined types were placed in this encounter.   Disposition:  in 6 month(s)  Brenda Casino, MD, [facility] [place] Director of the Advanced Lipid Disorders &  Cardiovascular Risk Reduction Clinic Diplomate of the American Board of Clinical Lipidology Attending Cardiologist  Direct Dial: [phone]  Fax: [phone]  Website:  [email or web address]  Brenda Casino, MD  05/31/2018 2:41 PM

## 2018-05-31 NOTE — Telephone Encounter (Signed)
LM for patient to call back if she had questions about her e-visit/instructions per MD. Notified her via VM that lab order and AVS will be mailed.

## 2018-05-31 NOTE — Telephone Encounter (Signed)
New Message    Pt is wondering if the virtual visit is going to be her paid medicare visit or if she will still need to schedule an office visit    Please call

## 2018-05-31 NOTE — Telephone Encounter (Signed)
Called patient and informed her that the virtual visits are being billed just like the normal office visits. Patient verbalized understanding.

## 2018-06-14 ENCOUNTER — Ambulatory Visit: Payer: Medicare Other | Admitting: Family Medicine

## 2018-06-15 ENCOUNTER — Telehealth: Payer: Self-pay | Admitting: Family Medicine

## 2018-06-15 NOTE — Telephone Encounter (Signed)
Yes this is fine.-thx

## 2018-06-15 NOTE — Telephone Encounter (Signed)
Patient aware, voiced understanding.

## 2018-06-15 NOTE — Telephone Encounter (Signed)
Copied from [place] [phone]. Topic: General - Other >> Jun 15, 2018  1:13 PM Lennox Solders wrote: Reason for CRM: pt is having pain in  both legs and knees unable to see  ortho doctor due to she is under  quarantine at [place] not due to covid 19. . Pt would like to know if its ok for her to use OTC voltaren gel on her leg and knees

## 2018-06-18 DIAGNOSIS — D039 Melanoma in situ, unspecified: Secondary | ICD-10-CM

## 2018-06-18 HISTORY — DX: Melanoma in situ, unspecified: D03.9

## 2018-06-25 ENCOUNTER — Encounter: Payer: Self-pay | Admitting: Family Medicine

## 2018-06-27 ENCOUNTER — Telehealth: Payer: Self-pay | Admitting: Family Medicine

## 2018-06-27 ENCOUNTER — Telehealth: Payer: Self-pay

## 2018-06-27 NOTE — Telephone Encounter (Signed)
SW pt and she stated she wakes up in pain. She uses icy hot and using voltaren gel OTC. The pain varies and radiates from her back of legs to her hip. Pain level is 8 or 9/10 in the mornings. She is unsure what else to do at this point.  Please advise of any other suggestions.

## 2018-06-27 NOTE — Telephone Encounter (Signed)
I suspect she is having pain from her hips and from the lowest part of her spine called the sacral spine. I would like her to get x-rays of both hips.. Please order bilateral DG hips with pelvis AND lumbar spine xrays 2-3 view: dx are acute bilateral hip pain AND acute midline low back pain with bilateral sciatica.  No new meds at this time, but she will need PT.  Please refer to PT of her convenience, same diagnoses. Thx

## 2018-06-27 NOTE — Telephone Encounter (Signed)
Moved 2 weeks ago and started with terrible back pain. She states pain goes down both legs.  Please contact.   Thank you

## 2018-06-27 NOTE — Telephone Encounter (Signed)
Spoke with [facility] to verify order was received and nothing further needed. Pt will be fine for appointment this afternoon.

## 2018-06-28 ENCOUNTER — Other Ambulatory Visit: Payer: Self-pay

## 2018-06-28 DIAGNOSIS — M25551 Pain in right hip: Secondary | ICD-10-CM

## 2018-06-28 DIAGNOSIS — M5442 Lumbago with sciatica, left side: Secondary | ICD-10-CM

## 2018-06-28 DIAGNOSIS — M5441 Lumbago with sciatica, right side: Secondary | ICD-10-CM

## 2018-06-28 NOTE — Telephone Encounter (Signed)
Patient would like to go to [facility] for her xrays. Please enter the order. Patient would like to wait to pursue PT until after Dr. Anitra Lauth reviews the results of the xray.

## 2018-06-28 NOTE — Telephone Encounter (Signed)
Orders are in for XR and lumbar spine.

## 2018-06-28 NOTE — Telephone Encounter (Signed)
LMTCB regarding recommendations.

## 2018-06-29 ENCOUNTER — Ambulatory Visit (HOSPITAL_COMMUNITY)
Admission: RE | Admit: 2018-06-29 | Discharge: 2018-06-29 | Disposition: A | Discharge status: Home / Self Care | Payer: Medicare Other | Source: Ambulatory Visit | Attending: Family Medicine | Admitting: Family Medicine

## 2018-06-29 ENCOUNTER — Other Ambulatory Visit: Payer: Self-pay

## 2018-06-29 DIAGNOSIS — M25551 Pain in right hip: Secondary | ICD-10-CM | POA: Insufficient documentation

## 2018-06-29 DIAGNOSIS — M5442 Lumbago with sciatica, left side: Secondary | ICD-10-CM | POA: Insufficient documentation

## 2018-06-29 DIAGNOSIS — M25552 Pain in left hip: Secondary | ICD-10-CM | POA: Diagnosis present

## 2018-06-29 DIAGNOSIS — M5441 Lumbago with sciatica, right side: Secondary | ICD-10-CM | POA: Diagnosis present

## 2018-06-29 LAB — HM MAMMOGRAPHY

## 2018-06-29 LAB — HM DEXA SCAN

## 2018-07-02 ENCOUNTER — Encounter: Payer: Self-pay | Admitting: Family Medicine

## 2018-07-02 ENCOUNTER — Telehealth: Payer: Self-pay | Admitting: Internal Medicine

## 2018-07-02 NOTE — Telephone Encounter (Signed)
Lab order mailed back to patient.

## 2018-07-02 NOTE — Telephone Encounter (Signed)
Received via mail patient's lab order (CBC, BMET, TSH, Free T4) that was signed by patient. Per her last visit, she is supposed to have these labs completed and an AVS was mailed with the lab order and VM left explaining details of AVS instructions.   Attempted to call patient to discuss - VM box full

## 2018-07-04 ENCOUNTER — Telehealth: Payer: Self-pay | Admitting: Family Medicine

## 2018-07-04 ENCOUNTER — Encounter: Payer: Self-pay | Admitting: Family Medicine

## 2018-07-04 NOTE — Telephone Encounter (Signed)
Pt would like bone density result from [facility]. Pt had test on this past Friday 06-29-2018

## 2018-07-04 NOTE — Telephone Encounter (Signed)
Received hard copy of results from bone density and mammogram. Abstracted into pt's chart. Given to PCP for review  Please advise, thanks.

## 2018-07-05 ENCOUNTER — Telehealth: Payer: Self-pay | Admitting: Family Medicine

## 2018-07-05 ENCOUNTER — Encounter: Payer: Self-pay | Admitting: Family Medicine

## 2018-07-05 NOTE — Telephone Encounter (Signed)
Ok for pt to have copy of these.

## 2018-07-05 NOTE — Telephone Encounter (Signed)
Pt was advised and would like copy of results. Will place up front to be mailed today.

## 2018-07-05 NOTE — Telephone Encounter (Signed)
SW pt and verified she is still taking vit D, calcium and fosamax. PCP verbally made aware.

## 2018-07-05 NOTE — Telephone Encounter (Signed)
Pls make sure she is still taking her vit D and calcium AND make sure she is taking her fosamax (alendronate)-->the prescription medication for osteoporosis.-thx

## 2018-07-09 ENCOUNTER — Telehealth: Payer: Self-pay

## 2018-07-09 DIAGNOSIS — M533 Sacrococcygeal disorders, not elsewhere classified: Secondary | ICD-10-CM

## 2018-07-09 DIAGNOSIS — M25551 Pain in right hip: Secondary | ICD-10-CM

## 2018-07-09 NOTE — Addendum Note (Signed)
Addended by: Caroll Rancher L on: 07/09/2018 01:52 PM   Modules accepted: Orders

## 2018-07-09 NOTE — Telephone Encounter (Signed)
Spoke with pt and she would like to go to [facility]. Referral has been placed.   Dx: sacroiliac pain, bilateral hip pain

## 2018-07-09 NOTE — Telephone Encounter (Signed)
Notes recorded by Ralph Dowdy, CMA on 07/02/2018 at 9:53 AM EDT  Patient notified of x-ray results, expressed understanding.    **However, Patient still is in extreme pain. Patient requesting next steps?   Please advise

## 2018-07-09 NOTE — Telephone Encounter (Signed)
Pls order ambulatory referral to physical therapy at pt's destination of choice [facility], [facility], [facility], etc).  Dx is sacroiliac pain, bilateral hip pain.-thx

## 2018-07-09 NOTE — Telephone Encounter (Signed)
-----   Message from [name], MD sent at 07/02/2018  1:51 AM EDT ----- Her hips, pelvis, and low back x-rays were all normal.  Reassure.-thx

## 2018-07-13 ENCOUNTER — Ambulatory Visit: Payer: Medicare Other | Admitting: Family Medicine

## 2018-07-17 ENCOUNTER — Encounter: Payer: Self-pay | Admitting: Family Medicine

## 2018-07-18 ENCOUNTER — Encounter: Payer: Self-pay | Admitting: Family Medicine

## 2018-07-18 ENCOUNTER — Ambulatory Visit (INDEPENDENT_AMBULATORY_CARE_PROVIDER_SITE_OTHER): Payer: Medicare Other | Admitting: Family Medicine

## 2018-07-18 ENCOUNTER — Other Ambulatory Visit: Payer: Self-pay

## 2018-07-18 VITALS — BP 135/78

## 2018-07-18 DIAGNOSIS — N183 Chronic kidney disease, stage 3 unspecified: Secondary | ICD-10-CM

## 2018-07-18 DIAGNOSIS — E7800 Pure hypercholesterolemia, unspecified: Secondary | ICD-10-CM | POA: Diagnosis not present

## 2018-07-18 DIAGNOSIS — Z7901 Long term (current) use of anticoagulants: Secondary | ICD-10-CM | POA: Diagnosis not present

## 2018-07-18 DIAGNOSIS — M8589 Other specified disorders of bone density and structure, multiple sites: Secondary | ICD-10-CM

## 2018-07-18 DIAGNOSIS — M545 Low back pain, unspecified: Secondary | ICD-10-CM

## 2018-07-18 DIAGNOSIS — I10 Essential (primary) hypertension: Secondary | ICD-10-CM | POA: Diagnosis not present

## 2018-07-18 DIAGNOSIS — F411 Generalized anxiety disorder: Secondary | ICD-10-CM

## 2018-07-18 MED ORDER — ALENDRONATE SODIUM 70 MG PO TABS: 70.0000 mg | ORAL_TABLET | ORAL | 3 refills | Status: DC

## 2018-07-18 NOTE — Progress Notes (Signed)
Virtual Visit via Video Note  I connected with pt on 07/18/18 at  4:00 PM EDT by a video enabled telemedicine application and verified that I am speaking with the correct person using two identifiers.  Location patient: home Location provider:work or home office Persons participating in the virtual visit: patient, provider  I discussed the limitations of evaluation and management by telemedicine and the availability of in person appointments. The patient expressed understanding and agreed to proceed.  Telemedicine visit is a necessity given the COVID-19 restrictions in place at the current time.  HPI: 83 y/o WF being seen today for /u HTN, HLD, GAD, and CRI III.   She has a-fib and chronic diastolic HF and is followed by Dr. Debara Pickett. She's living at [facility] now.  I last saw her 6 mo ago.  She has recently been complaining of pain in both hips and in low back. On 06/29/18 I got xrays of both hips and of L spine and  L spine results showed: IMPRESSION: Diffuse degenerative disc and facet disease. Grade 1 anterolisthesis at L4-5. No acute bony abnormality. Hips: xrays all normal, and the degenerative changes were again noted in visualized lower lumbar spine. She has been set up with PT.  She went yesterday and has already begun to feel better. She'll be going 2 times per week for a while.   HTN: BP 130/70s, she doesn't recall any HRs.  HLD: taking statin w/out problem.  GAD: has not had to take alpraz much at all, and when she does take it it is hs to relax and be able to sleep.  CRI III: avoids NSAIDs.  Hydrates well.   ROS: no CP, no SOB, no wheezing, no cough, no dizziness, no HAs, no rashes, no melena/hematochezia.  No polyuria or polydipsia.  No myalgias or arthralgias.   Past Medical History:  Diagnosis Date  . Abnormal EKG approx 2008   Nuclear stress test neg;   . Anxiety    with panic  . Arthritis   . Autoimmune hepatitis ([place])   . CAP (community acquired  pneumonia) 08/2017   Hospitalization for CAP/acute diast HF/rapid a-fib  . Cataract    s/p surgery--lens implants  . Chronic diastolic heart failure ([place]) 2019  . Chronic renal insufficiency, stage II (mild) 12/04/2012   GFR 60s.  Renal u/s when in hosp 08/2017 for CAP/CHF showed symmetric kidneys, echogenicity normal, w/out hydronephrosis.  . Cirrhosis ([place])   . DDD (degenerative disc disease), lumbar   . Diverticulosis 2009  . Fracture of radial shaft, left, closed 11/16/10   fell down flight of stairs  . History of kidney stones   . Hx of adenomatous colonic polyps 2002   surveillance colonoscopy 2009, +polypectomy done-tubular adenoma w/out high grade.  05/2013 tubular adenomas--recall 3 yrs  . Hyperlipidemia   . Hypertension   . Low TSH level 02/18/2016   T3 norm, T4 mildly elevated--suspected sick euthyroid syndrome.  Repeat labs 06/2016: normal.  . Melanoma in situ ([place]) 06/2018   L LL  . NASH (nonalcoholic steatohepatitis)   . Osteopenia    DEXA 08/2010; repeat DEXA 02/2015 worse: fosamax started.  06/2018 Dexa T score -2.4.  2020 maj osteop fx risk = 24%, Hip fx risk 7.3%. Rpt 2 yrs.  Marland Kitchen PAF (paroxysmal atrial fibrillation) ([place]) 02/2016   when in post-op for ankle surgery; spontaneously converted in hosp, seen by Dr. Debara Pickett in consultation--metoprolol rate control + xarelto recommended.  Metop d/c due to hypot.  Plan to  cont xarelto 20 mg qd indef due to CHAD-VASc score of 3.  A-fib w/RVR and CHF 07/2017; pt placed on amiodarone and plan for CV, but pt was in sinus rhythm when she went in for her DC CV, so she was sent home.  . Peripheral edema   . Pneumonia 2015   hx  . Rheumatic fever     Past Surgical History:  Procedure Laterality Date  . APPENDECTOMY  1966   done during surgery for tubal pregnancy  . CATARACT EXTRACTION W/ INTRAOCULAR LENS IMPLANT  2013   bilat  . COLONOSCOPY W/ POLYPECTOMY  05/2013   +diverticulosis; recall 3 yrs (Dr. Henrene Pastor)  . DEXA  02/2015; 06/2018   T  score -2.1 in both femoral necks; FRAX 10 yr risk of major osteoporotic fracture was 21%---fosamax started. 06/2018 T score -2.4.  repeat 1 yr.  Marland Kitchen EYE SURGERY    . LEFT HEART CATH AND CORONARY ANGIOGRAPHY N/A 09/01/2017   No angiographically significant CAD.  Upper normal left ventricular filling pressure.  Procedure: LEFT HEART CATH AND CORONARY ANGIOGRAPHY;  Surgeon: Nelva Bush, MD;  Location: [facility];  Service: Cardiovascular;  Laterality: N/A;  . OPEN REDUCTION INTERNAL FIXATION (ORIF) TIBIA/FIBULA FRACTURE Left 02/18/2016   Procedure: OPEN REDUCTION INTERNAL FIXATION (ORIF) Right ankle trimalleolar fracture;  Surgeon: Wylene Simmer, MD;  Location: [facility];  Service: Orthopedics;  Laterality: Left;  requests 65mns  . ORIF RADIAL FRACTURE  11/18/10   left; s/p slip on slippery floor and fell  . THORACENTESIS  08/2017   diagnostic and therapeutic.  Transudative.  Clx neg.  (+pulm edema/diastolic HF)  . TONSILLECTOMY    . TRANSTHORACIC ECHOCARDIOGRAM  02/18/2016; 08/10/17   LVEF of 55-60%, mild AI and mild MR and normal biatrial size.  07/2017--normal LV function, mild enlarge aortic root, mild/mod TR, bilat atrial enlargement.    Family History  Problem Relation Age of Onset  . Heart disease Mother   . Heart disease Father   . Hypertension Brother   . Diabetes Sister   . Colon cancer Neg Hx   . Pancreatic cancer Neg Hx   . Rectal cancer Neg Hx   . Stomach cancer Neg Hx      Current Outpatient Medications:  .  acetaminophen (TYLENOL) 325 MG tablet, Take 650 mg by mouth every 6 (six) hours as needed for moderate pain or headache. , Disp: , Rfl:  .  albuterol (PROVENTIL HFA;VENTOLIN HFA) 108 (90 Base) MCG/ACT inhaler, Inhale 2 puffs into the lungs every 4 (four) hours as needed for wheezing or shortness of breath., Disp: 8.5 g, Rfl: 1 .  ALPRAZolam (XANAX) 0.5 MG tablet, TAKE 1 TABLET BY MOUTH THREE TIMES DAILY IF NEEDED FOR STRESS, Disp: 90 tablet, Rfl: 5 .  amiodarone  (PACERONE) 200 MG tablet, Take 200 mg by mouth daily. , Disp: , Rfl:  .  amLODipine (NORVASC) 10 MG tablet, Take 10 mg by mouth daily., Disp: , Rfl:  .  atorvastatin (LIPITOR) 40 MG tablet, Take 1 tablet (40 mg total) by mouth daily., Disp: 90 tablet, Rfl: 1 .  Calcium Carbonate (CALCIUM 600 PO), Take 600 mg by mouth daily., Disp: , Rfl:  .  carboxymethylcellulose (REFRESH PLUS) 0.5 % SOLN, Place 2 drops into both eyes daily., Disp: , Rfl:  .  fluticasone (FLONASE) 50 MCG/ACT nasal spray, Place 2 sprays into both nostrils daily. , Disp: , Rfl:  .  furosemide (LASIX) 40 MG tablet, Take 1 tablet (40 mg total) by  mouth daily., Disp: 90 tablet, Rfl: 1 .  hydrALAZINE (APRESOLINE) 10 MG tablet, Take 2 tablets (20 mg total) by mouth 3 (three) times daily., Disp: 540 tablet, Rfl: 3 .  metoprolol tartrate (LOPRESSOR) 100 MG tablet, Take 1 tablet (100 mg total) by mouth 2 (two) times daily., Disp: 180 tablet, Rfl: 3 .  Multiple Vitamins-Minerals (CENTRUM SILVER ULTRA WOMENS) TABS, Take 1 tablet by mouth every evening. , Disp: , Rfl:  .  Potassium Chloride ER 20 MEQ TBCR, Take 20 mEq by mouth 2 (two) times daily., Disp: 180 tablet, Rfl: 3 .  potassium chloride SA (K-DUR,KLOR-CON) 20 MEQ tablet, TAKE 1 TABLET BY MOUTH 2 TIMES DAILY, Disp: 180 tablet, Rfl: 2 .  XARELTO 15 MG TABS tablet, TAKE 1 TABLET BY MOUTH EVERY DAY WITH SUPPER, Disp: 90 tablet, Rfl: 1 .  alendronate (FOSAMAX) 70 MG tablet, Take 1 tablet (70 mg total) by mouth every 7 (seven) days. Take with a full glass of water on an empty stomach. (Patient not taking: Reported on 05/31/2018), Disp: 4 tablet, Rfl: 11 .  docusate sodium (COLACE) 100 MG capsule, Take 100 mg by mouth 2 (two) times daily., Disp: , Rfl:  .  Multiple Vitamins-Minerals (AIRBORNE PO), Take 1 tablet by mouth daily as needed (for vacation). , Disp: , Rfl:  .  sodium chloride (OCEAN) 0.65 % nasal spray, Place 2 sprays into the nose daily. After shower, Disp: , Rfl:  .  trolamine  salicylate (ASPERCREME) 10 % cream, Apply 1 application topically daily., Disp: , Rfl:   EXAM:  VITALS per patient if applicable:  GENERAL: alert, oriented, appears well and in no acute distress  HEENT: atraumatic, conjunttiva clear, no obvious abnormalities on inspection of external nose and ears  NECK: normal movements of the head and neck  LUNGS: on inspection no signs of respiratory distress, breathing rate appears normal, no obvious gross SOB, gasping or wheezing  CV: no obvious cyanosis  MS: moves all visible extremities without noticeable abnormality  PSYCH/NEURO: pleasant and cooperative, no obvious depression or anxiety, speech and thought processing grossly intact  LABS: none today    Chemistry      Component Value Date/Time   NA 143 01/03/2018 1019   K 4.1 01/03/2018 1019   CL 105 01/03/2018 1019   CO2 28 01/03/2018 1019   BUN 24 (H) 01/03/2018 1019   CREATININE 1.38 (H) 01/03/2018 1019   CREATININE 1.23 (H) 10/23/2017 0938      Component Value Date/Time   CALCIUM 9.3 01/03/2018 1019   ALKPHOS 68 01/03/2018 1019   AST 22 01/03/2018 1019   ALT 24 01/03/2018 1019   BILITOT 0.6 01/03/2018 1019   BILITOT 0.4 10/12/2017 1538     Lab Results  Component Value Date   WBC 7.5 10/13/2017   HGB 12.4 10/13/2017   HCT 41.0 10/13/2017   MCV 95.3 10/13/2017   PLT 242 10/13/2017   Lab Results  Component Value Date   CHOL 100 09/08/2017   HDL 47.30 07/27/2017   LDLCALC 50 07/27/2017   TRIG 160.0 (H) 07/27/2017   CHOLHDL 3 07/27/2017   Lab Results  Component Value Date   TSH 1.570 10/12/2017   ASSESSMENT AND PLAN:  Discussed the following assessment and plan:  1) HTN: The current medical regimen is effective;  continue present plan and medications. She will leave off amlodipine, as her bp has been normal off of it.  2) HLD: tolerating statin. FLP and hepatic panel ordered--future.  3) CRI III:  avoids NSAIDs.  Hydrates well.   Lytes/cr today.  4)  GAD: this has not been that bad lately. Using alprazolam infrequently.  CSC UTD.  5) acute low back pain; spondylosis/DJD. She is already feeling better with PT.  6) Chronic anticoagulation for A-fib: doing well. I reiterated to her today that Dr. Lysbeth Penner latest instructions regarding amiodarone dosing was 100 mg qd. CBC monitoring today.  7) Osteopenia, with elevated fx risk. She has tolerated fosamax well.  Will RF this med and repeat DEXA around 06/2019.  I discussed the assessment and treatment plan with the patient. The patient was provided an opportunity to ask questions and all were answered. The patient agreed with the plan and demonstrated an understanding of the instructions.   The patient was advised to call back or seek an in-person evaluation if the symptoms worsen or if the condition fails to improve as anticipated.  F/u: 4 mo RCI  Signed:  Crissie Sickles, MD           07/18/2018

## 2018-07-23 ENCOUNTER — Encounter: Payer: Self-pay | Admitting: Family Medicine

## 2018-08-03 ENCOUNTER — Other Ambulatory Visit: Payer: Self-pay | Admitting: Internal Medicine

## 2018-08-21 ENCOUNTER — Telehealth: Payer: Self-pay

## 2018-08-21 NOTE — Telephone Encounter (Signed)
Signed and put in box to go up front.

## 2018-08-21 NOTE — Telephone Encounter (Signed)
Received PT initial evaluation form with plan of care/ recommendations.  PCP will review and sign, if appropriate.

## 2018-08-27 ENCOUNTER — Ambulatory Visit: Payer: Medicare Other

## 2018-09-03 ENCOUNTER — Telehealth: Payer: Self-pay | Admitting: Nurse Practitioner

## 2018-09-03 ENCOUNTER — Other Ambulatory Visit: Payer: Self-pay | Admitting: Orthopedic Surgery

## 2018-09-03 DIAGNOSIS — M545 Low back pain, unspecified: Secondary | ICD-10-CM

## 2018-09-03 DIAGNOSIS — G8929 Other chronic pain: Secondary | ICD-10-CM

## 2018-09-03 NOTE — Telephone Encounter (Signed)
Phone call to patient to verify medication list and allergies for myelogram procedure. Pt aware she will need to hold her xarelto at least 24hrs prior to myelogram appointment time, pending approval from cardiologist, Dr. Lyman Bishop. Pt verbalized understanding. Pre and post procedure instructions reviewed with pt. [place] hold request faxed to Dr. Debara Pickett, awaiting reply and further instruction.

## 2018-09-04 ENCOUNTER — Telehealth: Payer: Self-pay | Admitting: *Deleted

## 2018-09-04 NOTE — Telephone Encounter (Signed)
   Primary Cardiologist: [name], MD  Chart reviewed as part of pre-operative protocol coverage. Patient was contacted 09/04/2018 in reference to pre-operative risk assessment for pending surgery as outlined below.  Brenda Matthews was last seen on 05/31/2018 by Dr. Debara Pickett via a telemedicine visit.  Since that day, Brenda Matthews has done well from a cardiac standpoint. She is somewhat limited in mobility by back and knee pain, though this has been improved since starting prednisone. She can easily complete 4 METs without anginal complaints. No complaints of palpitations.  Therefore, based on ACC/AHA guidelines, the patient would be at acceptable risk for the planned procedure without further cardiovascular testing.   Per pharmacy recommendations, patient can hold xarelto 1 day prior to her upcoming CT myelogram. She should restart this medication as soon as she is cleared to do so by her surgeon.   I will route this recommendation to the requesting party via Epic fax function and remove from pre-op pool.  Please call with questions.  Abigail Butts, PA-C 09/04/2018, 10:18 AM

## 2018-09-04 NOTE — Telephone Encounter (Signed)
Patient with diagnosis of afib on Xarelto for anticoagulation.    Procedure: Ct myelogram Date of procedure: TBD  CHADS2-VASc score of  6 (CHF, HTN, AGE, CAD, AGE, female)  CrCl 52m/min  Per office protocol, patient can hold Xarelto for 3 days prior to procedure.

## 2018-09-04 NOTE — Telephone Encounter (Addendum)
   [facility] Pre-operative Risk Assessment    Request for surgical clearance:  1. What type of surgery is being performed? Ct myelogram  2. When is this surgery scheduled? TBD  3. What type of clearance is required (medical clearance vs. Pharmacy clearance to hold med vs. Both)? both  4. Are there any medications that need to be held prior to surgery and how long?xarelto-1 day  5. Practice name and name of physician performing surgery? [facility]   6. What is your office phone number [phone] ext [phone]   7.   What is your office fax number  [phone]  8.   Anesthesia type (None, local, MAC, general) ? None listed   Brenda Matthews 09/04/2018, 7:12 AM  _________________________________________________________________   (provider comments below)

## 2018-09-05 ENCOUNTER — Ambulatory Visit: Payer: Medicare Other

## 2018-09-07 ENCOUNTER — Ambulatory Visit: Payer: Medicare Other | Admitting: Gastroenterology

## 2018-09-10 ENCOUNTER — Other Ambulatory Visit: Payer: Self-pay

## 2018-09-10 ENCOUNTER — Ambulatory Visit (INDEPENDENT_AMBULATORY_CARE_PROVIDER_SITE_OTHER): Payer: Medicare Other | Admitting: Family Medicine

## 2018-09-10 DIAGNOSIS — N183 Chronic kidney disease, stage 3 unspecified: Secondary | ICD-10-CM

## 2018-09-10 DIAGNOSIS — Z7901 Long term (current) use of anticoagulants: Secondary | ICD-10-CM | POA: Diagnosis not present

## 2018-09-10 DIAGNOSIS — I10 Essential (primary) hypertension: Secondary | ICD-10-CM

## 2018-09-10 DIAGNOSIS — E7800 Pure hypercholesterolemia, unspecified: Secondary | ICD-10-CM

## 2018-09-10 LAB — COMPREHENSIVE METABOLIC PANEL
ALT: 27 U/L (ref 0–35)
AST: 21 U/L (ref 0–37)
Albumin: 4.5 g/dL (ref 3.5–5.2)
Alkaline Phosphatase: 64 U/L (ref 39–117)
BUN: 34 mg/dL — ABNORMAL HIGH (ref 6–23)
CO2: 25 mEq/L (ref 19–32)
Calcium: 9.4 mg/dL (ref 8.4–10.5)
Chloride: 105 mEq/L (ref 96–112)
Creatinine, Ser: 1.3 mg/dL — ABNORMAL HIGH (ref 0.40–1.20)
GFR: 39.25 mL/min — ABNORMAL LOW (ref >60.00)
Glucose, Bld: 101 mg/dL — ABNORMAL HIGH (ref 70–99)
Potassium: 4.6 mEq/L (ref 3.5–5.1)
Sodium: 142 mEq/L (ref 135–145)
Total Bilirubin: 0.8 mg/dL (ref 0.2–1.2)
Total Protein: 6.7 g/dL (ref 6.0–8.3)

## 2018-09-10 LAB — CBC
HCT: 43.3 % (ref 36.0–46.0)
Hemoglobin: 13.9 g/dL (ref 12.0–15.0)
MCHC: 32 g/dL (ref 30.0–36.0)
MCV: 93.6 fl (ref 78.0–100.0)
Platelets: 228 10*3/uL (ref 150.0–400.0)
RBC: 4.63 Mil/uL (ref 3.87–5.11)
RDW: 15 % (ref 11.5–15.5)
WBC: 13.6 10*3/uL — ABNORMAL HIGH (ref 4.0–10.5)

## 2018-09-10 LAB — LIPID PANEL
Cholesterol: 192 mg/dL (ref 0–200)
HDL: 73.1 mg/dL (ref >39.00)
NonHDL: 118.81
Total CHOL/HDL Ratio: 3
Triglycerides: 220 mg/dL — ABNORMAL HIGH (ref 0.0–149.0)
VLDL: 44 mg/dL — ABNORMAL HIGH (ref 0.0–40.0)

## 2018-09-10 LAB — LDL CHOLESTEROL, DIRECT: Direct LDL: 66 mg/dL

## 2018-09-12 ENCOUNTER — Ambulatory Visit
Admission: RE | Admit: 2018-09-12 | Discharge: 2018-09-12 | Disposition: A | Discharge status: Home / Self Care | Payer: Medicare Other | Source: Ambulatory Visit | Attending: Orthopedic Surgery | Admitting: Orthopedic Surgery

## 2018-09-12 ENCOUNTER — Other Ambulatory Visit: Payer: Self-pay

## 2018-09-12 DIAGNOSIS — G8929 Other chronic pain: Secondary | ICD-10-CM

## 2018-09-12 DIAGNOSIS — M545 Low back pain, unspecified: Secondary | ICD-10-CM

## 2018-09-12 MED ORDER — IOPAMIDOL (ISOVUE-M 200) INJECTION 41%
15.0000 mL | Freq: Once | INTRAMUSCULAR | Status: AC
  Administered 2018-09-12: 15 mL via INTRATHECAL

## 2018-09-12 MED ORDER — DIAZEPAM 5 MG PO TABS
5.0000 mg | ORAL_TABLET | Freq: Once | ORAL | Status: AC
  Administered 2018-09-12: 5 mg via ORAL

## 2018-09-12 NOTE — Discharge Instructions (Signed)

## 2018-09-12 NOTE — Progress Notes (Signed)
Pt reports she has been off of her Xarelto for 24 hours.

## 2018-09-14 ENCOUNTER — Telehealth: Payer: Self-pay | Admitting: Family Medicine

## 2018-09-14 MED ORDER — HYDROCODONE-ACETAMINOPHEN 5-325 MG PO TABS: 1.0000 | ORAL_TABLET | Freq: Four times a day (QID) | ORAL | 0 refills | Status: DC | PRN

## 2018-09-14 NOTE — Telephone Encounter (Signed)
Surgical clearance form filled out and stamped per provider instructions and faxed to Glendale Chard at [facility] [phone].   Phone if needed is [phone].   I have called patient twice without success, VM full.  Will continue to try to call patient.

## 2018-09-14 NOTE — Telephone Encounter (Signed)
Her low back scans look horrible. I have eRx'd a vicodin rx. If she needs a surgical clearance letter, pls type one up that says "all of patient's medical problems are optimally managed and stable.  She is medically cleared for surgery."  OK to use my signature stamp and just send my last office note with the letter.  If they have a formal clearance form then forget the letter and just sign the form and state that she is medically cleared with no specific recommendations from my standpoint. She will need to have her cardiologist do a clearance and he'll also give recommendations regarding when to stop her xarelto.  Hope this helps.-thx

## 2018-09-14 NOTE — Telephone Encounter (Signed)
Patient called office back and I advised her that the RX is ready for pick up.  I also advised her that the surgery clearance form has already been completed and faxed to Glendale Chard, surgical coordinator at [facility].  She is very thankful.

## 2018-09-14 NOTE — Telephone Encounter (Signed)
Patient came into office today with a surgery clearance form from [facility] for herniated disc.  Patient is in so much pain she states she won't be around much longer if someone can't give her something for pain.  She is currently only taking Tylenol 635m.  She states that [facility] has not supplied her with any pain relief.  I am worried about her state of mind due to pain.  Please advise.

## 2018-09-17 ENCOUNTER — Other Ambulatory Visit: Payer: Self-pay | Admitting: Surgical

## 2018-09-18 ENCOUNTER — Ambulatory Visit: Payer: Medicare Other | Admitting: Gastroenterology

## 2018-09-18 ENCOUNTER — Other Ambulatory Visit: Payer: Self-pay | Admitting: Family Medicine

## 2018-09-18 ENCOUNTER — Other Ambulatory Visit: Payer: Self-pay | Admitting: Internal Medicine

## 2018-09-18 NOTE — Telephone Encounter (Signed)
Please advise if OK to refill. Listed under Historical Provider. Thank you!

## 2018-09-20 NOTE — Patient Instructions (Addendum)
DUE TO COVID-19 ONLY ONE VISITOR IS ALLOWED TO COME WITH YOU AND STAY IN THE WAITING ROOM ONLY DURING PRE OP AND PROCEDURE DAY OF SURGERY.  THE 1 VISITOR MAY VISIT WITH YOU AFTER SURGERY IN YOUR PRIVATE ROOM DURING VISITING HOURS ONLY!    YOU NEED TO HAVE A COVID 19 TEST ON__Sat.9/5_____ @__11 :10_____, THIS TEST MUST BE DONE BEFORE SURGERY, COME  Brenda Matthews , [id number].  ([place]) ONCE YOUR COVID TEST IS COMPLETED, PLEASE BEGIN THE QUARANTINE INSTRUCTIONS AS OUTLINED IN YOUR HANDOUT.                  Brenda Matthews    Your procedure is scheduled on: Wednesday 09/26/18   Report to [facility] Main  Entrance   Report to admitting at   6:30 AM     Call this number if you have problems the morning of surgery [place], NO CHEWING GUM [place].    Do not eat food After Midnight.  YOU MAY HAVE CLEAR LIQUIDS FROM MIDNIGHT UNTIL 5:30 AM.   At 5:30 AM Please finish the prescribed Pre-Surgery drink  . Nothing by mouth after you finish the  drink !     Take these medicines the morning of surgery with A SIP OF WATER: Amiodorone, Hyralaizine, Metoprolo,               Use your inhaler and bring it with you to the hospital                                You may not have any metal on your body including hair pins and              piercings             Do not wear jewelry, make-up, lotions, powders or perfumes, deodorant             Do not wear nail polish.  Do not shave  48 hours prior to surgery.              Do not bring valuables to the hospital. [place].  Contacts, dentures or bridgework may not be worn into surgery.       Name and phone number of your driver:  Special Instructions: N/A              Please read over the following fact sheets you were  given: _____________________________________________________________________             [facility] - Preparing for Surgery  Before surgery, you can play an important role.   Because skin is not sterile, your skin needs to be as free of germs as possible.   You can reduce the number of germs on your skin by washing with CHG (chlorahexidine gluconate) soap before surgery.   CHG is an antiseptic cleaner which kills germs and bonds with the skin to continue killing germs even after washing. Please DO NOT use if you have an allergy to CHG or antibacterial soaps.   If your skin becomes reddened/irritated stop using the CHG and inform your nurse when you arrive at Short Stay. Do not shave (including legs and underarms)  for at least 48 hours prior to the first CHG shower.   Please follow these instructions carefully:  1.  Shower with CHG Soap the night before surgery and the  morning of Surgery.  2.  If you choose to wash your hair, wash your hair first as usual with your  normal  shampoo.  3.  After you shampoo, rinse your hair and body thoroughly to remove the  shampoo.                                        4.  Use CHG as you would any other liquid soap.  You can apply chg directly  to the skin and wash                       Gently with a scrungie or clean washcloth.  5.  Apply the CHG Soap to your body ONLY FROM THE NECK DOWN.   Do not use on face/ open                           Wound or open sores. Avoid contact with eyes, ears mouth and genitals (private parts).                       Wash face,  Genitals (private parts) with your normal soap.             6.  Wash thoroughly, paying special attention to the area where your surgery  will be performed.  7.  Thoroughly rinse your body with warm water from the neck down.  8.  DO NOT shower/wash with your normal soap after using and rinsing off  the CHG Soap.             9.  Pat yourself dry with a clean towel.            10.  Wear clean  pajamas.            11.  Place clean sheets on your bed the night of your first shower and do not  sleep with pets. Day of Surgery : Do not apply any lotions/deodorants the morning of surgery.  Please wear clean clothes to the hospital/surgery center.  FAILURE TO FOLLOW THESE INSTRUCTIONS MAY RESULT IN THE CANCELLATION OF YOUR SURGERY PATIENT SIGNATURE_________________________________  NURSE SIGNATURE__________________________________  ________________________________________________________________________   Adam Phenix  An incentive spirometer is a tool that can help keep your lungs clear and active. This tool measures how well you are filling your lungs with each breath. Taking long deep breaths may help reverse or decrease the chance of developing breathing (pulmonary) problems (especially infection) following:  A long period of time when you are unable to move or be active. BEFORE THE PROCEDURE   If the spirometer includes an indicator to show your best effort, your nurse or respiratory therapist will set it to a desired goal.  If possible, sit up straight or lean slightly forward. Try not to slouch.  Hold the incentive spirometer in an upright position. INSTRUCTIONS FOR USE  1. Sit on the edge of your bed if possible, or sit up as far as you can in bed or on a chair. 2. Hold the incentive spirometer in an upright position. 3. Breathe out normally. 4. Place the mouthpiece in your  mouth and seal your lips tightly around it. 5. Breathe in slowly and as deeply as possible, raising the piston or the ball toward the top of the column. 6. Hold your breath for 3-5 seconds or for as long as possible. Allow the piston or ball to fall to the bottom of the column. 7. Remove the mouthpiece from your mouth and breathe out normally. 8. Rest for a few seconds and repeat Steps 1 through 7 at least 10 times every 1-2 hours when you are awake. Take your time and take a few normal breaths  between deep breaths. 9. The spirometer may include an indicator to show your best effort. Use the indicator as a goal to work toward during each repetition. 10. After each set of 10 deep breaths, practice coughing to be sure your lungs are clear. If you have an incision (the cut made at the time of surgery), support your incision when coughing by placing a pillow or rolled up towels firmly against it. Once you are able to get out of bed, walk around indoors and cough well. You may stop using the incentive spirometer when instructed by your caregiver.  RISKS AND COMPLICATIONS  Take your time so you do not get dizzy or light-headed.  If you are in pain, you may need to take or ask for pain medication before doing incentive spirometry. It is harder to take a deep breath if you are having pain. AFTER USE  Rest and breathe slowly and easily.  It can be helpful to keep track of a log of your progress. Your caregiver can provide you with a simple table to help with this. If you are using the spirometer at home, follow these instructions: [place] IF:   You are having difficultly using the spirometer.  You have trouble using the spirometer as often as instructed.  Your pain medication is not giving enough relief while using the spirometer.  You develop fever of 100.5 F (38.1 C) or higher. SEEK IMMEDIATE MEDICAL CARE IF:   You cough up bloody sputum that had not been present before.  You develop fever of 102 F (38.9 C) or greater.  You develop worsening pain at or near the incision site. MAKE SURE YOU:   Understand these instructions.  Will watch your condition.  Will get help right away if you are not doing well or get worse. Document Released: 05/16/2006 Document Revised: 03/28/2011 Document Reviewed: 07/17/2006 [facility] Patient Information 2014 [place], [place].   ________________________________________________________________________

## 2018-09-21 ENCOUNTER — Encounter (HOSPITAL_COMMUNITY): Payer: Self-pay

## 2018-09-21 ENCOUNTER — Telehealth: Payer: Self-pay | Admitting: Family Medicine

## 2018-09-21 ENCOUNTER — Telehealth: Payer: Self-pay | Admitting: Internal Medicine

## 2018-09-21 ENCOUNTER — Other Ambulatory Visit: Payer: Self-pay

## 2018-09-21 ENCOUNTER — Encounter (HOSPITAL_COMMUNITY)
Admission: RE | Admit: 2018-09-21 | Discharge: 2018-09-21 | Disposition: A | Discharge status: Home / Self Care | Payer: Medicare Other | Source: Ambulatory Visit | Attending: Orthopedic Surgery | Admitting: Orthopedic Surgery

## 2018-09-21 ENCOUNTER — Ambulatory Visit (HOSPITAL_COMMUNITY)
Admission: RE | Admit: 2018-09-21 | Discharge: 2018-09-21 | Disposition: A | Discharge status: Home / Self Care | Payer: Medicare Other | Source: Ambulatory Visit | Attending: Surgical | Admitting: Surgical

## 2018-09-21 DIAGNOSIS — I480 Paroxysmal atrial fibrillation: Secondary | ICD-10-CM | POA: Diagnosis not present

## 2018-09-21 DIAGNOSIS — Z9842 Cataract extraction status, left eye: Secondary | ICD-10-CM | POA: Diagnosis not present

## 2018-09-21 DIAGNOSIS — I5032 Chronic diastolic (congestive) heart failure: Secondary | ICD-10-CM | POA: Insufficient documentation

## 2018-09-21 DIAGNOSIS — Z7983 Long term (current) use of bisphosphonates: Secondary | ICD-10-CM | POA: Diagnosis not present

## 2018-09-21 DIAGNOSIS — Z961 Presence of intraocular lens: Secondary | ICD-10-CM | POA: Diagnosis not present

## 2018-09-21 DIAGNOSIS — E785 Hyperlipidemia, unspecified: Secondary | ICD-10-CM | POA: Diagnosis not present

## 2018-09-21 DIAGNOSIS — Z9841 Cataract extraction status, right eye: Secondary | ICD-10-CM | POA: Insufficient documentation

## 2018-09-21 DIAGNOSIS — M545 Low back pain, unspecified: Secondary | ICD-10-CM

## 2018-09-21 DIAGNOSIS — Z7901 Long term (current) use of anticoagulants: Secondary | ICD-10-CM | POA: Diagnosis not present

## 2018-09-21 DIAGNOSIS — I130 Hypertensive heart and chronic kidney disease with heart failure and stage 1 through stage 4 chronic kidney disease, or unspecified chronic kidney disease: Secondary | ICD-10-CM | POA: Diagnosis not present

## 2018-09-21 DIAGNOSIS — Z01818 Encounter for other preprocedural examination: Secondary | ICD-10-CM | POA: Diagnosis not present

## 2018-09-21 DIAGNOSIS — Z79899 Other long term (current) drug therapy: Secondary | ICD-10-CM | POA: Insufficient documentation

## 2018-09-21 DIAGNOSIS — N189 Chronic kidney disease, unspecified: Secondary | ICD-10-CM | POA: Diagnosis not present

## 2018-09-21 DIAGNOSIS — M5126 Other intervertebral disc displacement, lumbar region: Secondary | ICD-10-CM | POA: Diagnosis not present

## 2018-09-21 DIAGNOSIS — K7581 Nonalcoholic steatohepatitis (NASH): Secondary | ICD-10-CM | POA: Diagnosis not present

## 2018-09-21 LAB — CBC WITH DIFFERENTIAL/PLATELET
Abs Immature Granulocytes: 0.02 10*3/uL (ref 0.00–0.07)
Basophils Absolute: 0 10*3/uL (ref 0.0–0.1)
Basophils Relative: 0 %
Eosinophils Absolute: 0.2 10*3/uL (ref 0.0–0.5)
Eosinophils Relative: 3 %
HCT: 44.6 % (ref 36.0–46.0)
Hemoglobin: 13.6 g/dL (ref 12.0–15.0)
Immature Granulocytes: 0 %
Lymphocytes Relative: 16 %
Lymphs Abs: 1 10*3/uL (ref 0.7–4.0)
MCH: 30 pg (ref 26.0–34.0)
MCHC: 30.5 g/dL (ref 30.0–36.0)
MCV: 98.5 fL (ref 80.0–100.0)
Monocytes Absolute: 0.5 10*3/uL (ref 0.1–1.0)
Monocytes Relative: 7 %
Neutro Abs: 4.6 10*3/uL (ref 1.7–7.7)
Neutrophils Relative %: 74 %
Platelets: 175 10*3/uL (ref 150–400)
RBC: 4.53 MIL/uL (ref 3.87–5.11)
RDW: 14 % (ref 11.5–15.5)
WBC: 6.4 10*3/uL (ref 4.0–10.5)
nRBC: 0 % (ref 0.0–0.2)

## 2018-09-21 LAB — COMPREHENSIVE METABOLIC PANEL
ALT: 39 U/L (ref 0–44)
AST: 32 U/L (ref 15–41)
Albumin: 4 g/dL (ref 3.5–5.0)
Alkaline Phosphatase: 83 U/L (ref 38–126)
Anion gap: 8 (ref 5–15)
BUN: 34 mg/dL — ABNORMAL HIGH (ref 8–23)
CO2: 28 mmol/L (ref 22–32)
Calcium: 9.4 mg/dL (ref 8.9–10.3)
Chloride: 105 mmol/L (ref 98–111)
Creatinine, Ser: 1.23 mg/dL — ABNORMAL HIGH (ref 0.44–1.00)
GFR calc Af Amer: 48 mL/min — ABNORMAL LOW (ref >60)
GFR calc non Af Amer: 41 mL/min — ABNORMAL LOW (ref >60)
Glucose, Bld: 99 mg/dL (ref 70–99)
Potassium: 4.6 mmol/L (ref 3.5–5.1)
Sodium: 141 mmol/L (ref 135–145)
Total Bilirubin: 0.9 mg/dL (ref 0.3–1.2)
Total Protein: 7.6 g/dL (ref 6.5–8.1)

## 2018-09-21 LAB — APTT: aPTT: 32 seconds (ref 24–36)

## 2018-09-21 LAB — PROTIME-INR
INR: 1.4 — ABNORMAL HIGH (ref 0.8–1.2)
Prothrombin Time: 16.9 seconds — ABNORMAL HIGH (ref 11.4–15.2)

## 2018-09-21 LAB — SURGICAL PCR SCREEN
MRSA, PCR: NEGATIVE
Staphylococcus aureus: NEGATIVE

## 2018-09-21 LAB — ABO/RH: ABO/RH(D): A POS

## 2018-09-21 MED ORDER — AMIODARONE HCL 100 MG PO TABS: 100.0000 mg | ORAL_TABLET | Freq: Every day | ORAL | Status: DC

## 2018-09-21 NOTE — Telephone Encounter (Signed)
PER telemedicine visit 05-31-2018: She has been on declining doses of amiodarone due to some elevation in her TSH.  We will need to repeat labs but I would recommend decreasing her amiodarone further to 100 mg daily.  She remains on this as she does have severe atrial enlargement. Medication Instructions(per AVS):  Your physician has recommended you make the following change in your medication: DECREASE amiodarone to 165m daily  Returned call to pt she states that she picked up her amiodarone at the pharmacy and the bottle states that she is to take 2-2098mtab BID. This made her upset and her HR went up to 118. She states that this "scared" her because she is only taking 10011maily. reviewed LOV note and pt is only supposed to be taking 100m31mily like she is taking. Reassured pt that she is taking medication and the medication was refilled incorrectly she will cut the 200mg63m in 1/2 so that she can take the 100mg 78my as directed. She will take her BP/HR daily 1 hour after taking her amiodarone and make sure that her HR is not >100, she will call if it is.  Called [facility] s/w Lori tCecille Rubintify of the incorrect rx sent. She took a verbal order and will change in thei sx. Ok'd refills for one year of the 100mg33m

## 2018-09-21 NOTE — Telephone Encounter (Signed)
New message   Pt c/o medication issue:  1. Name of Medication: amiodarone (PACERONE) 200 MG tablet  2. How are you currently taking this medication (dosage and times per day)? 1 time daily   3. Are you having a reaction (difficulty breathing--STAT)? No   4. What is your medication issue? Patient has about what dosage she should be taking. Please call to discuss.

## 2018-09-21 NOTE — Telephone Encounter (Signed)
This is just fine the way she is doing it.  Reassure her.-thx

## 2018-09-21 NOTE — Telephone Encounter (Signed)
Patient had pre-op appointment at [facility] today. When going over medications patient mentioned always takes at least 4 hydrALAZINE (APRESOLINE) 10 MG tablet a day. She sometimes forgets to take the mid day dose. Is that okay? Please call patient.

## 2018-09-21 NOTE — Telephone Encounter (Signed)
Rx directions for Hydralazine are to take 2 tabs po TID. Patient is forgetting to take mid day dose.  Patient would like clarification if this is okay or will effect her if not taken as prescribed. Please advise, thanks.

## 2018-09-21 NOTE — Telephone Encounter (Signed)
Patient advised and voiced understanding.  

## 2018-09-21 NOTE — Progress Notes (Signed)
PCP - Dr. Anitra Lauth Cardiologist - Dr. Debara Pickett  Chest x-ray - 10/13/17 EKG - 09/21/18 Stress Test - no ECHO - 08/10/17 Cardiac Cath - 09/01/17  Sleep Study - no CPAP - no  Fasting Blood Sugar - NA Checks Blood Sugar _____ times a day  Blood Thinner Instructions: Dr. Debara Pickett  Aspirin Instructions:Stop Xarelto 7 days before surgery Last Dose:09/19/18  Anesthesia review:   Patient denies shortness of breath, fever, cough and chest pain at PAT appointment yes  Patient verbalized understanding of instructions that were given to them at the PAT appointment. Patient was also instructed that they will need to review over the PAT instructions again at home before surgery. Yes  Patient reports that she has been taking her Amiodarone wrong and didn't realize it. I told her to call her PCP and cardiologist to make sure both agree and to clarify he dosages with her. An EKG was done 09/21/18 to R/O Afib at this time

## 2018-09-22 ENCOUNTER — Other Ambulatory Visit (HOSPITAL_COMMUNITY)
Admission: RE | Admit: 2018-09-22 | Discharge: 2018-09-22 | Disposition: A | Discharge status: Home / Self Care | Payer: Medicare Other | Source: Ambulatory Visit | Attending: Orthopedic Surgery | Admitting: Orthopedic Surgery

## 2018-09-22 DIAGNOSIS — Z01812 Encounter for preprocedural laboratory examination: Secondary | ICD-10-CM | POA: Insufficient documentation

## 2018-09-22 DIAGNOSIS — Z20828 Contact with and (suspected) exposure to other viral communicable diseases: Secondary | ICD-10-CM | POA: Diagnosis not present

## 2018-09-23 LAB — NOVEL CORONAVIRUS, NAA (HOSP ORDER, SEND-OUT TO REF LAB; TAT 18-24 HRS): SARS-CoV-2, NAA: NOT DETECTED

## 2018-09-25 MED ORDER — BUPIVACAINE LIPOSOME 1.3 % IJ SUSP
20.0000 mL | INTRAMUSCULAR | Status: AC
  Filled 2018-09-25: qty 20

## 2018-09-25 NOTE — Anesthesia Preprocedure Evaluation (Addendum)
Anesthesia Evaluation  Patient identified by MRN, date of birth, ID band Patient awake    Reviewed: Allergy & Precautions, NPO status , Patient's Chart, lab work & pertinent test results  Airway Mallampati: II  TM Distance: >3 FB Neck ROM: Full    Dental no notable dental hx.    Pulmonary neg pulmonary ROS,    Pulmonary exam normal breath sounds clear to auscultation       Cardiovascular hypertension, Pt. on medications +CHF  (-) Past MI Normal cardiovascular exam+ dysrhythmias Atrial Fibrillation  Rhythm:Regular Rate:Normal     Neuro/Psych Anxiety negative neurological ROS     GI/Hepatic negative GI ROS, (+) Cirrhosis       , Hepatitis - ([facility])  Endo/Other  negative endocrine ROS  Renal/GU negative Renal ROS  negative genitourinary   Musculoskeletal negative musculoskeletal ROS (+)   Abdominal   Peds negative pediatric ROS (+)  Hematology negative hematology ROS (+)   Anesthesia Other Findings   Reproductive/Obstetrics negative OB ROS                            Anesthesia Physical Anesthesia Plan  ASA: III  Anesthesia Plan: General   Post-op Pain Management:    Induction: Intravenous  PONV Risk Score and Plan: 3 and Ondansetron, Dexamethasone and Treatment may vary due to age or medical condition  Airway Management Planned: Oral ETT  Additional Equipment:   Intra-op Plan:   Post-operative Plan: Extubation in OR  Informed Consent: I have reviewed the patients History and Physical, chart, labs and discussed the procedure including the risks, benefits and alternatives for the proposed anesthesia with the patient or authorized representative who has indicated his/her understanding and acceptance.     Dental advisory given  Plan Discussed with: CRNA  Anesthesia Plan Comments: (See PAT note 09/21/2018, Konrad Felix, PA-C)       Anesthesia Quick Evaluation

## 2018-09-25 NOTE — Progress Notes (Signed)
Anesthesia Chart Review   Case: [id number] Date/Time: 09/26/18 0815   Procedure: Decompressive Lumbar Laminectomy and Microdiscectomy L5-S1 Left (N/A ) - 156mn   Anesthesia type: General   Pre-op diagnosis: Herniated lumbar disc L5-S1 Left   Location: [facility] ROOM 05 / [facility]   Surgeon: GLatanya Maudlin MD      DISCUSSION:83 y.o. never smoker with h/o HLD, HTN, chronic renal insufficiency, PAF, NASH, chronic diastolic heart failure, herniated lumbar disc L5-S1 scheduled for above procedure 09/26/2018 with Dr. RLatanya Maudlin   Per PCP, Dr. PShawnie Dapper 09/14/2018, "All of patient's medical problems are optimally managed and stable.  She is medically cleared for surgery."  Cardiac clearance received 09/04/2018.  Per KLajean Silvius PA-C, "Brenda CORDOBAwas last seen on 05/31/2018 by Dr. HDebara Pickettvia a telemedicine visit.  Since that day, Brenda STITZERhas done well from a cardiac standpoint. She is somewhat limited in mobility by back and knee pain, though this has been improved since starting prednisone. She can easily complete 4 METs without anginal complaints. No complaints of palpitations. Therefore, based on ACC/AHA guidelines, the patient would be at acceptable risk for the planned procedure without further cardiovascular testing." This clearance was given prior to CT myelogram, no changes since then.  She reports last dose of Xarelto 09/19/2018 as advised by Dr. HDebara Pickett   Anticipate pt can proceed with planned procedure barring acute status change.   VS: BP (!) 172/78   Pulse 61   Temp 36.7 C (Oral)   Resp 20   Ht 5' 1"  (1.549 m)   Wt 77.6 kg   LMP  (LMP Unknown)   SpO2 96%   BMI 32.31 kg/m   PROVIDERS: [place], PAdrian Blackwater MD is PCP   HLyman Bishop MD is Cardiologist  LABS: Labs reviewed: Acceptable for surgery. (all labs ordered are listed, but only abnormal results are displayed)  Labs Reviewed  COMPREHENSIVE METABOLIC PANEL - Abnormal; Notable for the following components:       Result Value   BUN 34 (*)    Creatinine, Ser 1.23 (*)    GFR calc non Af Amer 41 (*)    GFR calc Af Amer 48 (*)    All other components within normal limits  PROTIME-INR - Abnormal; Notable for the following components:   Prothrombin Time 16.9 (*)    INR 1.4 (*)    All other components within normal limits  SURGICAL PCR SCREEN  APTT  CBC WITH DIFFERENTIAL/PLATELET  TYPE AND SCREEN  ABO/RH     IMAGES:   EKG: 09/24/2018 Rate 54 bpm Sinus bradycardia Left axis deviation Moderate voltage criteria for LVH, may be normal variant Abnormal ECG No significant change since last tracing  CV: Cardiac Cath 09/01/17 Conclusions: 1. No angiographically significant coronary artery disease. 2. Upper normal left ventricular filling pressure.  Recommendations: 1. Continue medical therapy of diastolic heart failure and atrial fibrillation. 2. Gentle post-catheterization hydration given resolved acute kidney injury. 3. Restart heparin infusion 2 hours after TR band removal; rivaroxaban could be restarted as soon as tomorrow.  Echo 08/10/2017 Study Conclusions  - Left ventricle: The cavity size was normal. Wall thickness was   increased in a pattern of mild LVH. Systolic function was normal.   The estimated ejection fraction was in the range of 50% to 55%.   Wall motion was normal; there were no regional wall motion   abnormalities. - Aortic valve: There was trivial regurgitation. - Aortic root: The aortic root was mildly dilated. -  Mitral valve: There was mild regurgitation. - Left atrium: The atrium was severely dilated. - Right atrium: The atrium was mildly dilated. - Tricuspid valve: There was mild-moderate regurgitation. - Pulmonary arteries: PA peak pressure: 32 mm Hg (S). - Pericardium, extracardiac: A trivial pericardial effusion was   identified.  Impressions:  - Normal LV systolic function; mild LVH; trace AI; mildly dilated   aortic root; mild MR; biatrial  enlargement; mild to moderate TR. Past Medical History:  Diagnosis Date  . Abnormal EKG approx 2008   Nuclear stress test neg;   . Anxiety    with panic  . Arthritis   . Autoimmune hepatitis ([place])   . CAP (community acquired pneumonia) 08/2017   Hospitalization for CAP/acute diast HF/rapid a-fib  . Cataract    s/p surgery--lens implants  . Chronic diastolic heart failure ([place]) 2019  . Chronic renal insufficiency, stage II (mild) 12/04/2012   GFR 60s.  Renal u/s when in hosp 08/2017 for CAP/CHF showed symmetric kidneys, echogenicity normal, w/out hydronephrosis.  . Cirrhosis ([place])   . DDD (degenerative disc disease), lumbar   . Diverticulosis 2009  . Dysrhythmia    Afib  . Fracture of radial shaft, left, closed 11/16/10   fell down flight of stairs  . History of kidney stones   . Hx of adenomatous colonic polyps 2002   surveillance colonoscopy 2009, +polypectomy done-tubular adenoma w/out high grade.  05/2013 tubular adenomas--recall 3 yrs  . Hyperlipidemia   . Hypertension   . Low TSH level 02/18/2016   T3 norm, T4 mildly elevated--suspected sick euthyroid syndrome.  Repeat labs 06/2016: normal.  . Melanoma in situ ([place]) 06/2018   L LL  . NASH (nonalcoholic steatohepatitis)   . Osteopenia    DEXA 08/2010; repeat DEXA 02/2015 worse: fosamax started.  06/2018 Dexa T score -2.4.  2020 maj osteop fx risk = 24%, Hip fx risk 7.3%. Rpt 2 yrs.  Marland Kitchen PAF (paroxysmal atrial fibrillation) ([place]) 02/2016   when in post-op for ankle surgery; spontaneously converted in hosp, seen by Dr. Debara Pickett in consultation--metoprolol rate control + xarelto recommended.  Metop d/c due to hypot.  Plan to cont xarelto 20 mg qd indef due to CHAD-VASc score of 3.  A-fib w/RVR and CHF 07/2017; pt placed on amiodarone and plan for CV, but pt was in sinus rhythm when she went in for her DC CV, so she was sent home.  . Peripheral edema   . Pneumonia 2015   hx with sepsis  . Rheumatic fever     Past Surgical History:   Procedure Laterality Date  . APPENDECTOMY  1966   done during surgery for tubal pregnancy  . CATARACT EXTRACTION W/ INTRAOCULAR LENS IMPLANT  2013   bilat  . COLONOSCOPY W/ POLYPECTOMY  05/2013   +diverticulosis; recall 3 yrs (Dr. Henrene Pastor)  . DEXA  02/2015; 06/2018   T score -2.1 in both femoral necks; FRAX 10 yr risk of major osteoporotic fracture was 21%---fosamax started. 06/2018 T score -2.4.  repeat 1 yr.  Marland Kitchen EYE SURGERY    . LEFT HEART CATH AND CORONARY ANGIOGRAPHY N/A 09/01/2017   No angiographically significant CAD.  Upper normal left ventricular filling pressure.  Procedure: LEFT HEART CATH AND CORONARY ANGIOGRAPHY;  Surgeon: Nelva Bush, MD;  Location: [place] [facility];  Service: Cardiovascular;  Laterality: N/A;  . OPEN REDUCTION INTERNAL FIXATION (ORIF) TIBIA/FIBULA FRACTURE Left 02/18/2016   Procedure: OPEN REDUCTION INTERNAL FIXATION (ORIF) Right ankle trimalleolar fracture;  Surgeon: Wylene Simmer, MD;  Location: [facility];  Service: Orthopedics;  Laterality: Left;  requests 37mns  . ORIF RADIAL FRACTURE  11/18/10   left; s/p slip on slippery floor and fell  . THORACENTESIS  08/2017   diagnostic and therapeutic.  Transudative.  Clx neg.  (+pulm edema/diastolic HF)  . TONSILLECTOMY    . TRANSTHORACIC ECHOCARDIOGRAM  02/18/2016; 08/10/17   LVEF of 55-60%, mild AI and mild MR and normal biatrial size.  07/2017--normal LV function, mild enlarge aortic root, mild/mod TR, bilat atrial enlargement.    MEDICATIONS: . acetaminophen (TYLENOL) 650 MG CR tablet  . albuterol (PROVENTIL HFA;VENTOLIN HFA) 108 (90 Base) MCG/ACT inhaler  . alendronate (FOSAMAX) 70 MG tablet  . ALPRAZolam (XANAX) 0.5 MG tablet  . amiodarone (PACERONE) 100 MG tablet  . Apoaequorin (PREVAGEN PO)  . atorvastatin (LIPITOR) 40 MG tablet  . Biotin 5000 MCG TABS  . Calcium Carbonate (CALCIUM 600 PO)  . carboxymethylcellulose (REFRESH PLUS) 0.5 % SOLN  . Coenzyme Q10 ([employer] COQ-10) 200 MG capsule  . CRANBERRY PO  .  docusate sodium (COLACE) 100 MG capsule  . fluticasone (FLONASE) 50 MCG/ACT nasal spray  . fluticasone (FLONASE) 50 MCG/ACT nasal spray  . furosemide (LASIX) 40 MG tablet  . hydrALAZINE (APRESOLINE) 10 MG tablet  . HYDROcodone-acetaminophen (NORCO/VICODIN) 5-325 MG tablet  . Menthol, Topical Analgesic, (ICY HOT EX)  . metoprolol tartrate (LOPRESSOR) 100 MG tablet  . Multiple Vitamins-Minerals (AIRBORNE PO)  . Multiple Vitamins-Minerals (CENTRUM SILVER ULTRA WOMENS) TABS  . oxyCODONE-acetaminophen (PERCOCET/ROXICET) 5-325 MG tablet  . potassium chloride SA (K-DUR) 20 MEQ tablet  . sodium chloride (OCEAN) 0.65 % nasal spray  . Turmeric 500 MG CAPS  . XARELTO 15 MG TABS tablet   No current facility-administered medications for this encounter.    .Derrill MemoON 09/26/2018] bupivacaine liposome (EXPAREL) 1.3 % injection 266 mg   JMaia PlanWDanbury Surgical Center LPPre-Surgical Testing ([id number]/08/20  10:58 AM

## 2018-09-25 NOTE — H&P (Signed)
Brenda Matthews is an 83 y.o. female.   Chief Complaint: low back pain HPI: The patient presented to the office after completing a series of Gels[place] in both knees. She has pain in her lower legs more than in her bilateral knees. Her pain is mainly when she gets up out of bed. No known injury but she did fracture her left ankle years ago. She was seen by her pcp and had x-rays taken. Her pcp told her to wear tennis shoes, use icey hot and ordered some physical therapy for her. Physical therapy has been helping but it is making her left ankle worse. She takes tylenol arthritis for pain. She had ORIF of her left ankle 2 years ago with Dr. Doran Durand. She reports low back pain as well. CT myelogram showed a disc herniation and severe spinal stenosis at L5-S1.   Past Medical History:  Diagnosis Date  . Abnormal EKG approx 2008   Nuclear stress test neg;   . Anxiety    with panic  . Arthritis   . Autoimmune hepatitis ([place])   . CAP (community acquired pneumonia) 08/2017   Hospitalization for CAP/acute diast HF/rapid a-fib  . Cataract    s/p surgery--lens implants  . Chronic diastolic heart failure ([place]) 2019  . Chronic renal insufficiency, stage II (mild) 12/04/2012   GFR 60s.  Renal u/s when in hosp 08/2017 for CAP/CHF showed symmetric kidneys, echogenicity normal, w/out hydronephrosis.  . Cirrhosis ([place]   . DDD (degenerative disc disease), lumbar   . Diverticulosis 2009  . Dysrhythmia    Afib  . Fracture of radial shaft, left, closed 11/16/10   fell down flight of stairs  . History of kidney stones   . Hx of adenomatous colonic polyps 2002   surveillance colonoscopy 2009, +polypectomy done-tubular adenoma w/out high grade.  05/2013 tubular adenomas--recall 3 yrs  . Hyperlipidemia   . Hypertension   . Low TSH level 02/18/2016   T3 norm, T4 mildly elevated--suspected sick euthyroid syndrome.  Repeat labs 06/2016: normal.  . Melanoma in situ ([place]) 06/2018   L LL  . NASH (nonalcoholic  steatohepatitis)   . Osteopenia    DEXA 08/2010; repeat DEXA 02/2015 worse: fosamax started.  06/2018 Dexa T score -2.4.  2020 maj osteop fx risk = 24%, Hip fx risk 7.3%. Rpt 2 yrs.  Marland Kitchen PAF (paroxysmal atrial fibrillation) ([place]) 02/2016   when in post-op for ankle surgery; spontaneously converted in hosp, seen by Dr. Debara Pickett in consultation--metoprolol rate control + xarelto recommended.  Metop d/c due to hypot.  Plan to cont xarelto 20 mg qd indef due to CHAD-VASc score of 3.  A-fib w/RVR and CHF 07/2017; pt placed on amiodarone and plan for CV, but pt was in sinus rhythm when she went in for her DC CV, so she was sent home.  . Peripheral edema   . Pneumonia 2015   hx with sepsis  . Rheumatic fever     Past Surgical History:  Procedure Laterality Date  . APPENDECTOMY  1966   done during surgery for tubal pregnancy  . CATARACT EXTRACTION W/ INTRAOCULAR LENS IMPLANT  2013   bilat  . COLONOSCOPY W/ POLYPECTOMY  05/2013   +diverticulosis; recall 3 yrs (Dr. Henrene Pastor)  . DEXA  02/2015; 06/2018   T score -2.1 in both femoral necks; FRAX 10 yr risk of major osteoporotic fracture was 21%---fosamax started. 06/2018 T score -2.4.  repeat 1 yr.  Marland Kitchen EYE SURGERY    . LEFT HEART CATH  AND CORONARY ANGIOGRAPHY N/A 09/01/2017   No angiographically significant CAD.  Upper normal left ventricular filling pressure.  Procedure: LEFT HEART CATH AND CORONARY ANGIOGRAPHY;  Surgeon: Nelva Bush, MD;  Location: [facility];  Service: Cardiovascular;  Laterality: N/A;  . OPEN REDUCTION INTERNAL FIXATION (ORIF) TIBIA/FIBULA FRACTURE Left 02/18/2016   Procedure: OPEN REDUCTION INTERNAL FIXATION (ORIF) Right ankle trimalleolar fracture;  Surgeon: Wylene Simmer, MD;  Location: [facility];  Service: Orthopedics;  Laterality: Left;  requests 68mns  . ORIF RADIAL FRACTURE  11/18/10   left; s/p slip on slippery floor and fell  . THORACENTESIS  08/2017   diagnostic and therapeutic.  Transudative.  Clx neg.  (+pulm edema/diastolic HF)   . TONSILLECTOMY    . TRANSTHORACIC ECHOCARDIOGRAM  02/18/2016; 08/10/17   LVEF of 55-60%, mild AI and mild MR and normal biatrial size.  07/2017--normal LV function, mild enlarge aortic root, mild/mod TR, bilat atrial enlargement.    Family History  Problem Relation Age of Onset  . Heart disease Mother   . Heart disease Father   . Hypertension Brother   . Diabetes Sister   . Colon cancer Neg Hx   . Pancreatic cancer Neg Hx   . Rectal cancer Neg Hx   . Stomach cancer Neg Hx    Social History:  reports that she has never smoked. She has never used smokeless tobacco. She reports current alcohol use. She reports that she does not use drugs.  Allergies:  Allergies  Allergen Reactions  . Augmentin [Amoxicillin-Pot Clavulanate] Nausea And Vomiting and Other (See Comments)    "projectile vomiting" Has patient had a PCN reaction causing immediate rash, facial/tongue/throat swelling, SOB or lightheadedness with hypotension:No Has patient had a PCN reaction causing severe rash involving mucus membranes or skin necrosis:No Has patient had a PCN reaction that required hospitalization:No Has patient had a PCN reaction occurring within the last 10 years:Yes If all of the above answers are "NO", then may proceed with Cephalosporin use.     Current Outpatient Medications:  .  acetaminophen (TYLENOL) 650 MG CR tablet, Take 650 mg by mouth every 8 (eight) hours as needed for pain., Disp: , Rfl:  .  albuterol (PROVENTIL HFA;VENTOLIN HFA) 108 (90 Base) MCG/ACT inhaler, Inhale 2 puffs into the lungs every 4 (four) hours as needed for wheezing or shortness of breath., Disp: 8.5 g, Rfl: 1 .  ALPRAZolam (XANAX) 0.5 MG tablet, TAKE 1 TABLET BY MOUTH THREE TIMES DAILY IF NEEDED FOR STRESS (Patient taking differently: Take 0.5 mg by mouth 3 (three) times daily as needed (stress). ), Disp: 90 tablet, Rfl: 5 .  atorvastatin (LIPITOR) 40 MG tablet, TAKE 1 TABLET BY MOUTH EVERY DAY (Patient taking differently:  Take 40 mg by mouth every evening. ), Disp: 90 tablet, Rfl: 1 .  carboxymethylcellulose (REFRESH PLUS) 0.5 % SOLN, Place 2 drops into both eyes daily as needed (dry/irritated eyes.). , Disp: , Rfl:  .  docusate sodium (COLACE) 100 MG capsule, Take 100 mg by mouth daily as needed (constipation.). , Disp: , Rfl:  .  fluticasone (FLONASE) 50 MCG/ACT nasal spray, Place 2 sprays into both nostrils daily. , Disp: , Rfl:  .  fluticasone (FLONASE) 50 MCG/ACT nasal spray, Place into both nostrils daily., Disp: , Rfl:  .  furosemide (LASIX) 40 MG tablet, Take 1 tablet (40 mg total) by mouth daily., Disp: 90 tablet, Rfl: 1 .  hydrALAZINE (APRESOLINE) 10 MG tablet, Take 2 tablets (20 mg total) by mouth 3 (three)  times daily., Disp: 540 tablet, Rfl: 3 .  Menthol, Topical Analgesic, (ICY HOT EX), Apply 1 application topically every 4 (four) hours as needed (calf pain)., Disp: , Rfl:  .  metoprolol tartrate (LOPRESSOR) 100 MG tablet, TAKE 1 TABLET BY MOUTH 2 TIMES DAILY (Patient taking differently: Take 100 mg by mouth 2 (two) times daily. ), Disp: 180 tablet, Rfl: 2 .  Multiple Vitamins-Minerals (AIRBORNE PO), Take 1 tablet by mouth daily as needed (immune support). , Disp: , Rfl:  .  oxyCODONE-acetaminophen (PERCOCET/ROXICET) 5-325 MG tablet, Take 0.5-1 tablets by mouth 2 (two) times daily as needed (severe pain.). , Disp: , Rfl:  .  potassium chloride SA (K-DUR) 20 MEQ tablet, Take 20 mEq by mouth 2 (two) times daily., Disp: , Rfl:  .  XARELTO 15 MG TABS tablet, TAKE 1 TABLET BY MOUTH EVERY DAY WITH SUPPER (Patient taking differently: Take 15 mg by mouth daily with supper. ), Disp: 90 tablet, Rfl: 1 .  alendronate (FOSAMAX) 70 MG tablet, Take 1 tablet (70 mg total) by mouth every 7 (seven) days. Take with a full glass of water on an empty stomach. (Patient not taking: Reported on 09/18/2018), Disp: 12 tablet, Rfl: 3 .  amiodarone (PACERONE) 100 MG tablet, Take 1 tablet (100 mg total) by mouth daily., Disp: , Rfl:   .  Apoaequorin (PREVAGEN PO), Take 1 tablet by mouth daily., Disp: , Rfl:  .  Biotin 5000 MCG TABS, Take 5,000 mcg by mouth daily., Disp: , Rfl:  .  Calcium Carbonate (CALCIUM 600 PO), Take 1,200 mg by mouth daily. , Disp: , Rfl:  .  Coenzyme Q10 ([employer] COQ-10) 200 MG capsule, Take 200 mg by mouth daily., Disp: , Rfl:  .  CRANBERRY PO, Take 2 tablets by mouth daily., Disp: , Rfl:  .  HYDROcodone-acetaminophen (NORCO/VICODIN) 5-325 MG tablet, Take 1-2 tablets by mouth every 6 (six) hours as needed for moderate pain. (Patient not taking: Reported on 09/18/2018), Disp: 42 tablet, Rfl: 0 .  Multiple Vitamins-Minerals (CENTRUM SILVER ULTRA WOMENS) TABS, Take 1 tablet by mouth every evening. , Disp: , Rfl:  .  sodium chloride (OCEAN) 0.65 % nasal spray, Place 2 sprays into the nose daily. After shower, Disp: , Rfl:  .  Turmeric 500 MG CAPS, Take 500 mg by mouth daily., Disp: , Rfl:    Review of Systems  Constitutional: Negative.   HENT: Negative.   Eyes: Negative.   Respiratory: Negative.   Cardiovascular: Negative.   Genitourinary: Negative.   Musculoskeletal: Positive for back pain, joint pain and myalgias. Negative for falls and neck pain.  Skin: Negative.   Neurological: Negative.   Endo/Heme/Allergies: Negative.   Psychiatric/Behavioral: Negative.     Vitals Ht 5 ft 2 in Wt 162 lbs BMI 29.6 BP 142 / 78   Physical Exam  Constitutional: She appears well-developed. No distress.  Obese  HENT:  Head: Normocephalic and atraumatic.  Right Ear: External ear normal.  Left Ear: External ear normal.  Nose: Nose normal.  Mouth/Throat: Oropharynx is clear and moist.  Eyes: EOM are normal.  Neck: Normal range of motion. Neck supple.  Cardiovascular: Normal rate, regular rhythm, normal heart sounds and intact distal pulses.  No murmur heard. Respiratory: Effort normal and breath sounds normal. No respiratory distress. She has no wheezes.  GI: Soft. Bowel sounds are normal. She exhibits no  distension. There is no abdominal tenderness.  Musculoskeletal:     Comments: Pain with motion of the lumbar spine with radiation  of pain down the legs, left greater than right. No bony tenderness over the lumbar spine. Normal painless motion of the hips. No effusion or instability in the knees. Tender along the medial joint lines of both knees. Mild patellofemoral crepitus.  No swelling noted over the ankles. No bony tenderness over the right ankle. No pain with dorsiflexion and plantar flexion of the ankles. Mild tenderness over the medial and lateral malleolus of the left ankle. Incisions healed with no signs of infection.  Neurological: She is alert. No sensory deficit.  Sensation intact in LE. 3/5 weakness in EHL of both feet.  Skin: No rash noted. She is not diaphoretic. No erythema.  Psychiatric: She has a normal mood and affect. Her behavior is normal.     Assessment/Plan She needs a decompressive lumbar laminectomy and microdiscectomy L5-S1 left. Risks and benefits discussed with the patient by Dr. Gladstone Lighter. Plan for stay overnight for observation.   H&P performed by Dr, Latanya Maudlin Documented by Ardeen Jourdain, PA-C  [place], PA-C 09/25/2018, 12:25 PM

## 2018-09-26 ENCOUNTER — Other Ambulatory Visit: Payer: Self-pay

## 2018-09-26 ENCOUNTER — Ambulatory Visit (HOSPITAL_COMMUNITY): Payer: Medicare Other

## 2018-09-26 ENCOUNTER — Ambulatory Visit (HOSPITAL_COMMUNITY): Payer: Medicare Other | Admitting: Registered Nurse

## 2018-09-26 ENCOUNTER — Other Ambulatory Visit: Payer: Self-pay | Admitting: Internal Medicine

## 2018-09-26 ENCOUNTER — Observation Stay (HOSPITAL_COMMUNITY)
Admission: RE | Admit: 2018-09-26 | Discharge: 2018-09-27 | Disposition: A | Discharge status: Home / Self Care | Payer: Medicare Other | Source: Ambulatory Visit | Attending: Orthopedic Surgery | Admitting: Orthopedic Surgery

## 2018-09-26 ENCOUNTER — Ambulatory Visit: Payer: Medicare Other | Admitting: Physician Assistant

## 2018-09-26 ENCOUNTER — Encounter (HOSPITAL_COMMUNITY)
Admission: RE | Disposition: A | Discharge status: Home / Self Care | Payer: Self-pay | Source: Ambulatory Visit | Attending: Orthopedic Surgery

## 2018-09-26 ENCOUNTER — Ambulatory Visit (HOSPITAL_COMMUNITY): Payer: Medicare Other | Admitting: Physician Assistant

## 2018-09-26 ENCOUNTER — Encounter (HOSPITAL_COMMUNITY): Payer: Self-pay | Admitting: *Deleted

## 2018-09-26 DIAGNOSIS — Z23 Encounter for immunization: Secondary | ICD-10-CM | POA: Diagnosis not present

## 2018-09-26 DIAGNOSIS — M1990 Unspecified osteoarthritis, unspecified site: Secondary | ICD-10-CM | POA: Insufficient documentation

## 2018-09-26 DIAGNOSIS — I130 Hypertensive heart and chronic kidney disease with heart failure and stage 1 through stage 4 chronic kidney disease, or unspecified chronic kidney disease: Secondary | ICD-10-CM | POA: Insufficient documentation

## 2018-09-26 DIAGNOSIS — M6281 Muscle weakness (generalized): Secondary | ICD-10-CM | POA: Diagnosis not present

## 2018-09-26 DIAGNOSIS — I5032 Chronic diastolic (congestive) heart failure: Secondary | ICD-10-CM | POA: Insufficient documentation

## 2018-09-26 DIAGNOSIS — Z419 Encounter for procedure for purposes other than remedying health state, unspecified: Secondary | ICD-10-CM

## 2018-09-26 DIAGNOSIS — E785 Hyperlipidemia, unspecified: Secondary | ICD-10-CM | POA: Diagnosis not present

## 2018-09-26 DIAGNOSIS — Z79899 Other long term (current) drug therapy: Secondary | ICD-10-CM | POA: Diagnosis not present

## 2018-09-26 DIAGNOSIS — I480 Paroxysmal atrial fibrillation: Secondary | ICD-10-CM | POA: Diagnosis not present

## 2018-09-26 DIAGNOSIS — N182 Chronic kidney disease, stage 2 (mild): Secondary | ICD-10-CM | POA: Insufficient documentation

## 2018-09-26 DIAGNOSIS — M8580 Other specified disorders of bone density and structure, unspecified site: Secondary | ICD-10-CM | POA: Insufficient documentation

## 2018-09-26 DIAGNOSIS — Z7901 Long term (current) use of anticoagulants: Secondary | ICD-10-CM | POA: Diagnosis not present

## 2018-09-26 DIAGNOSIS — M4807 Spinal stenosis, lumbosacral region: Secondary | ICD-10-CM | POA: Diagnosis not present

## 2018-09-26 DIAGNOSIS — M48062 Spinal stenosis, lumbar region with neurogenic claudication: Secondary | ICD-10-CM | POA: Diagnosis present

## 2018-09-26 DIAGNOSIS — F419 Anxiety disorder, unspecified: Secondary | ICD-10-CM | POA: Diagnosis not present

## 2018-09-26 HISTORY — PX: LUMBAR LAMINECTOMY/DECOMPRESSION MICRODISCECTOMY: SHX5026

## 2018-09-26 LAB — TYPE AND SCREEN
ABO/RH(D): A POS
Antibody Screen: NEGATIVE

## 2018-09-26 SURGERY — LUMBAR LAMINECTOMY/DECOMPRESSION MICRODISCECTOMY 1 LEVEL
Anesthesia: General

## 2018-09-26 MED ORDER — DEXAMETHASONE SODIUM PHOSPHATE 10 MG/ML IJ SOLN
INTRAMUSCULAR | Status: DC | PRN
  Administered 2018-09-26: 5 mg via INTRAVENOUS

## 2018-09-26 MED ORDER — PROPOFOL 10 MG/ML IV BOLUS
INTRAVENOUS | Status: AC
  Filled 2018-09-26: qty 20

## 2018-09-26 MED ORDER — ACETAMINOPHEN 650 MG RE SUPP: 650.0000 mg | RECTAL | Status: DC | PRN

## 2018-09-26 MED ORDER — BUPIVACAINE-EPINEPHRINE 0.5% -1:200000 IJ SOLN
INTRAMUSCULAR | Status: DC | PRN
  Administered 2018-09-26: 20 mL

## 2018-09-26 MED ORDER — ONDANSETRON HCL 4 MG/2ML IJ SOLN
4.0000 mg | Freq: Four times a day (QID) | INTRAMUSCULAR | Status: DC | PRN
  Administered 2018-09-26: 16:00:00 4 mg via INTRAVENOUS
  Filled 2018-09-26: qty 2

## 2018-09-26 MED ORDER — INFLUENZA VAC A&B SA ADJ QUAD 0.5 ML IM PRSY
0.5000 mL | PREFILLED_SYRINGE | INTRAMUSCULAR | Status: AC
  Administered 2018-09-27: 0.5 mL via INTRAMUSCULAR
  Filled 2018-09-26: qty 0.5

## 2018-09-26 MED ORDER — ONDANSETRON HCL 4 MG/2ML IJ SOLN
INTRAMUSCULAR | Status: AC
  Filled 2018-09-26: qty 2

## 2018-09-26 MED ORDER — METHOCARBAMOL 500 MG PO TABS: 500.0000 mg | ORAL_TABLET | Freq: Four times a day (QID) | ORAL | Status: DC | PRN

## 2018-09-26 MED ORDER — ONDANSETRON HCL 4 MG/2ML IJ SOLN
INTRAMUSCULAR | Status: DC | PRN
  Administered 2018-09-26: 4 mg via INTRAVENOUS

## 2018-09-26 MED ORDER — BUPIVACAINE LIPOSOME 1.3 % IJ SUSP
INTRAMUSCULAR | Status: DC | PRN
  Administered 2018-09-26: 20 mL

## 2018-09-26 MED ORDER — CEFAZOLIN SODIUM-DEXTROSE 1-4 GM/50ML-% IV SOLN
1.0000 g | Freq: Three times a day (TID) | INTRAVENOUS | Status: AC
  Administered 2018-09-26 – 2018-09-27 (×3): 1 g via INTRAVENOUS
  Filled 2018-09-26 (×3): qty 50

## 2018-09-26 MED ORDER — CEFAZOLIN SODIUM-DEXTROSE 2-4 GM/100ML-% IV SOLN
2.0000 g | INTRAVENOUS | Status: AC
  Administered 2018-09-26: 09:00:00 2 g via INTRAVENOUS
  Filled 2018-09-26: qty 100

## 2018-09-26 MED ORDER — PROPOFOL 10 MG/ML IV BOLUS
INTRAVENOUS | Status: DC | PRN
  Administered 2018-09-26: 120 mg via INTRAVENOUS

## 2018-09-26 MED ORDER — METHOCARBAMOL 500 MG IVPB - SIMPLE MED
500.0000 mg | Freq: Four times a day (QID) | INTRAVENOUS | Status: DC | PRN
  Filled 2018-09-26: qty 50

## 2018-09-26 MED ORDER — HYDROMORPHONE HCL 1 MG/ML IJ SOLN: 0.5000 mg | INTRAMUSCULAR | Status: DC | PRN

## 2018-09-26 MED ORDER — PHENOL 1.4 % MT LIQD
1.0000 | OROMUCOSAL | Status: DC | PRN
  Filled 2018-09-26: qty 177

## 2018-09-26 MED ORDER — FLEET ENEMA 7-19 GM/118ML RE ENEM: 1.0000 | ENEMA | Freq: Once | RECTAL | Status: DC | PRN

## 2018-09-26 MED ORDER — THROMBIN (RECOMBINANT) 5000 UNITS EX SOLR
CUTANEOUS | Status: AC
  Filled 2018-09-26: qty 5000

## 2018-09-26 MED ORDER — SODIUM CHLORIDE 0.9 % IV SOLN
INTRAVENOUS | Status: DC | PRN
  Administered 2018-09-26: 500 mL

## 2018-09-26 MED ORDER — ACETAMINOPHEN 325 MG PO TABS: 650.0000 mg | ORAL_TABLET | ORAL | Status: DC | PRN

## 2018-09-26 MED ORDER — LIDOCAINE 2% (20 MG/ML) 5 ML SYRINGE
INTRAMUSCULAR | Status: DC | PRN
  Administered 2018-09-26: 60 mg via INTRAVENOUS

## 2018-09-26 MED ORDER — CHLORHEXIDINE GLUCONATE 4 % EX LIQD: 60.0000 mL | Freq: Once | CUTANEOUS | Status: DC

## 2018-09-26 MED ORDER — BISACODYL 5 MG PO TBEC: 5.0000 mg | DELAYED_RELEASE_TABLET | Freq: Every day | ORAL | Status: DC | PRN

## 2018-09-26 MED ORDER — LACTATED RINGERS IV SOLN
INTRAVENOUS | Status: DC
  Administered 2018-09-26: 14:00:00 via INTRAVENOUS

## 2018-09-26 MED ORDER — SODIUM CHLORIDE 0.9 % IV SOLN
INTRAVENOUS | Status: AC
  Filled 2018-09-26: qty 500000

## 2018-09-26 MED ORDER — LACTATED RINGERS IV SOLN: INTRAVENOUS | Status: DC

## 2018-09-26 MED ORDER — POLYETHYLENE GLYCOL 3350 17 G PO PACK: 17.0000 g | PACK | Freq: Every day | ORAL | Status: DC | PRN

## 2018-09-26 MED ORDER — FENTANYL CITRATE (PF) 100 MCG/2ML IJ SOLN
INTRAMUSCULAR | Status: DC | PRN
  Administered 2018-09-26 (×4): 25 ug via INTRAVENOUS

## 2018-09-26 MED ORDER — THROMBIN (RECOMBINANT) 5000 UNITS EX SOLR
CUTANEOUS | Status: DC | PRN
  Administered 2018-09-26: 10000 [IU] via TOPICAL

## 2018-09-26 MED ORDER — ROCURONIUM BROMIDE 50 MG/5ML IV SOSY
PREFILLED_SYRINGE | INTRAVENOUS | Status: DC | PRN
  Administered 2018-09-26: 50 mg via INTRAVENOUS

## 2018-09-26 MED ORDER — HYDROCODONE-ACETAMINOPHEN 5-325 MG PO TABS
1.0000 | ORAL_TABLET | ORAL | Status: DC | PRN
  Administered 2018-09-26 – 2018-09-27 (×4): 1 via ORAL
  Filled 2018-09-26 (×4): qty 1

## 2018-09-26 MED ORDER — SUGAMMADEX SODIUM 200 MG/2ML IV SOLN
INTRAVENOUS | Status: DC | PRN
  Administered 2018-09-26: 200 mg via INTRAVENOUS

## 2018-09-26 MED ORDER — LIDOCAINE 2% (20 MG/ML) 5 ML SYRINGE
INTRAMUSCULAR | Status: AC
  Filled 2018-09-26: qty 5

## 2018-09-26 MED ORDER — BACITRACIN-NEOMYCIN-POLYMYXIN 400-5-5000 EX OINT
TOPICAL_OINTMENT | CUTANEOUS | Status: AC
  Filled 2018-09-26: qty 1

## 2018-09-26 MED ORDER — MENTHOL 3 MG MT LOZG
1.0000 | LOZENGE | OROMUCOSAL | Status: DC | PRN
  Administered 2018-09-26: 3 mg via ORAL
  Filled 2018-09-26: qty 9

## 2018-09-26 MED ORDER — MEPERIDINE HCL 50 MG/ML IJ SOLN: 6.2500 mg | INTRAMUSCULAR | Status: DC | PRN

## 2018-09-26 MED ORDER — FENTANYL CITRATE (PF) 100 MCG/2ML IJ SOLN: 25.0000 ug | INTRAMUSCULAR | Status: DC | PRN

## 2018-09-26 MED ORDER — ALBUTEROL SULFATE HFA 108 (90 BASE) MCG/ACT IN AERS
INHALATION_SPRAY | RESPIRATORY_TRACT | Status: AC
  Filled 2018-09-26: qty 6.7

## 2018-09-26 MED ORDER — HYDROCODONE-ACETAMINOPHEN 10-325 MG PO TABS: 2.0000 | ORAL_TABLET | ORAL | Status: DC | PRN

## 2018-09-26 MED ORDER — OXYCODONE HCL 5 MG PO TABS: 5.0000 mg | ORAL_TABLET | ORAL | Status: DC | PRN

## 2018-09-26 MED ORDER — FENTANYL CITRATE (PF) 100 MCG/2ML IJ SOLN
INTRAMUSCULAR | Status: AC
  Filled 2018-09-26: qty 2

## 2018-09-26 MED ORDER — LACTATED RINGERS IV SOLN
INTRAVENOUS | Status: DC
  Administered 2018-09-26 (×2): via INTRAVENOUS

## 2018-09-26 MED ORDER — EPHEDRINE SULFATE-NACL 50-0.9 MG/10ML-% IV SOSY
PREFILLED_SYRINGE | INTRAVENOUS | Status: DC | PRN
  Administered 2018-09-26 (×2): 10 mg via INTRAVENOUS
  Administered 2018-09-26: 5 mg via INTRAVENOUS

## 2018-09-26 MED ORDER — BUPIVACAINE-EPINEPHRINE 0.5% -1:200000 IJ SOLN
INTRAMUSCULAR | Status: AC
  Filled 2018-09-26: qty 1

## 2018-09-26 MED ORDER — ONDANSETRON HCL 4 MG PO TABS: 4.0000 mg | ORAL_TABLET | Freq: Four times a day (QID) | ORAL | Status: DC | PRN

## 2018-09-26 MED ORDER — SODIUM CHLORIDE 0.9 % IV SOLN
INTRAVENOUS | Status: DC | PRN
  Administered 2018-09-26: 09:00:00 25 ug/min via INTRAVENOUS

## 2018-09-26 MED ORDER — METOCLOPRAMIDE HCL 5 MG/ML IJ SOLN: 10.0000 mg | Freq: Once | INTRAMUSCULAR | Status: DC | PRN

## 2018-09-26 MED ORDER — DEXAMETHASONE SODIUM PHOSPHATE 10 MG/ML IJ SOLN
INTRAMUSCULAR | Status: AC
  Filled 2018-09-26: qty 1

## 2018-09-26 SURGICAL SUPPLY — 47 items
AGENT HMST SPONGE THK3/8 (HEMOSTASIS) ×1
BAG DECANTER FOR FLEXI CONT (MISCELLANEOUS) ×3 IMPLANT
BAG SPEC THK2 15X12 ZIP CLS (MISCELLANEOUS)
BAG ZIPLOCK 12X15 (MISCELLANEOUS) IMPLANT
CLEANER TIP ELECTROSURG 2X2 (MISCELLANEOUS) ×3 IMPLANT
CLOSURE WOUND 1/2 X4 (GAUZE/BANDAGES/DRESSINGS) ×1
CONT SPEC 4OZ CLIKSEAL STRL BL (MISCELLANEOUS) ×2 IMPLANT
COVER SURGICAL LIGHT HANDLE (MISCELLANEOUS) ×3 IMPLANT
COVER WAND RF STERILE (DRAPES) ×2 IMPLANT
DRAPE MICROSCOPE LEICA (MISCELLANEOUS) ×3 IMPLANT
DRAPE POUCH INSTRU U-SHP 10X18 (DRAPES) ×3 IMPLANT
DRAPE SHEET LG 3/4 BI-LAMINATE (DRAPES) ×3 IMPLANT
DRAPE SURG 17X11 SM STRL (DRAPES) ×3 IMPLANT
DRSG ADAPTIC 3X8 NADH LF (GAUZE/BANDAGES/DRESSINGS) ×3 IMPLANT
DRSG PAD ABDOMINAL 8X10 ST (GAUZE/BANDAGES/DRESSINGS) ×6 IMPLANT
DURAPREP 26ML APPLICATOR (WOUND CARE) ×3 IMPLANT
ELECT BLADE TIP CTD 4 INCH (ELECTRODE) ×3 IMPLANT
ELECT REM PT RETURN 15FT ADLT (MISCELLANEOUS) ×3 IMPLANT
GAUZE SPONGE 4X4 12PLY STRL (GAUZE/BANDAGES/DRESSINGS) ×3 IMPLANT
GLOVE BIOGEL PI IND STRL 8.5 (GLOVE) ×1 IMPLANT
GLOVE BIOGEL PI INDICATOR 8.5 (GLOVE) ×2
GLOVE ECLIPSE 8.0 STRL XLNG CF (GLOVE) ×3 IMPLANT
GOWN STRL REUS W/TWL XL LVL3 (GOWN DISPOSABLE) ×3 IMPLANT
HEMOSTAT SPONGE AVITENE ULTRA (HEMOSTASIS) ×3 IMPLANT
KIT BASIN OR (CUSTOM PROCEDURE TRAY) ×3 IMPLANT
KIT TURNOVER KIT A (KITS) IMPLANT
MANIFOLD NEPTUNE II (INSTRUMENTS) ×3 IMPLANT
NDL SAFETY ECLIPSE 18X1.5 (NEEDLE) IMPLANT
NDL SPNL 18GX3.5 QUINCKE PK (NEEDLE) ×2 IMPLANT
NEEDLE HYPO 18GX1.5 SHARP (NEEDLE) ×6
NEEDLE HYPO 22GX1.5 SAFETY (NEEDLE) ×3 IMPLANT
NEEDLE SPNL 18GX3.5 QUINCKE PK (NEEDLE) ×6 IMPLANT
PACK LAMINECTOMY ORTHO (CUSTOM PROCEDURE TRAY) ×3 IMPLANT
PATTIES SURGICAL .5 X.5 (GAUZE/BANDAGES/DRESSINGS) IMPLANT
PATTIES SURGICAL .75X.75 (GAUZE/BANDAGES/DRESSINGS) ×3 IMPLANT
PATTIES SURGICAL 1X1 (DISPOSABLE) ×3 IMPLANT
SPONGE LAP 4X18 RFD (DISPOSABLE) IMPLANT
STAPLER VISISTAT 35W (STAPLE) ×3 IMPLANT
STRIP CLOSURE SKIN 1/2X4 (GAUZE/BANDAGES/DRESSINGS) ×2 IMPLANT
SUT VIC AB 1 CT1 27 (SUTURE) ×6
SUT VIC AB 1 CT1 27XBRD ANTBC (SUTURE) ×2 IMPLANT
SUT VIC AB 2-0 CT1 27 (SUTURE) ×6
SUT VIC AB 2-0 CT1 TAPERPNT 27 (SUTURE) ×2 IMPLANT
SYR 20ML LL LF (SYRINGE) ×6 IMPLANT
TAPE CLOTH SURG 4X10 WHT LF (GAUZE/BANDAGES/DRESSINGS) ×2 IMPLANT
TOWEL OR 17X26 10 PK STRL BLUE (TOWEL DISPOSABLE) ×3 IMPLANT
TOWEL OR NON WOVEN STRL DISP B (DISPOSABLE) ×3 IMPLANT

## 2018-09-26 NOTE — Evaluation (Signed)
Physical Therapy Evaluation Patient Details Name: Brenda Matthews MRN: [id number] DOB: 1935-04-18 Today's Date: 09/26/2018   History of Present Illness  83 yo female s/p L5-S1 decompression, laminectomy, microdiscectomy on 09/26/18. PMH includes anxiety, autoimmune hepatitis, CAP, diastolic HF, HLD, HTN, osteopenia, ORIF tib/fib  Clinical Impression   Pt presents with decreased knowledge of back precautions, difficulty performing log roll technique, increased time and effort to perform mobility tasks post-operatively. Pt to benefit from acute PT to address deficits. Pt ambulated hallway distance with RW with min guard assist, required verbal cuing for safety with RW and back precautions during all mobility. Pt educated on ankle pumps (20/hour) to perform this afternoon/evening to increase circulation, to pt's tolerance and limited by pain. PT to progress mobility as tolerated, and will continue to follow acutely.        Follow Up Recommendations Follow surgeon's recommendation for DC plan and follow-up therapies;Supervision for mobility/OOB    Equipment Recommendations  Rolling walker with 5" wheels    Recommendations for Other Services       Precautions / Restrictions Precautions Precautions: Fall;Back Precaution Booklet Issued: Yes (comment) Precaution Comments: handout administered, reviewed, and demonstrated; no bending back, no twisting, no arching, and no lifting >5 lbs reviewed with pt, and pt able to repeat back to PT with verbal cuing at end of session Restrictions Weight Bearing Restrictions: No      Mobility  Bed Mobility Overal bed mobility: Needs Assistance Bed Mobility: Rolling;Sidelying to Sit Rolling: Min guard Sidelying to sit: Min assist       General bed mobility comments: Pt instructed in log roll technique; min guard for safety for rolling, min assist for trunk elevation and LE management.  Transfers Overall transfer level: Needs assistance Equipment used:  Rolling walker (2 wheeled) Transfers: Sit to/from Stand Sit to Stand: Min guard;From elevated surface         General transfer comment: Min guard for safety, verbal cuing for hand placement when rising.  Ambulation/Gait Ambulation/Gait assistance: Min guard;+2 safety/equipment Gait Distance (Feet): 200 Feet Assistive device: Rolling walker (2 wheeled) Gait Pattern/deviations: Step-through pattern;Decreased stride length;Trunk flexed Gait velocity: decr   General Gait Details: Min guard for safety. Verbal cuing for upright posture, not twisting spine when looking in different direction, placement in RW.  Stairs            Wheelchair Mobility    Modified Rankin (Stroke Patients Only)       Balance Overall balance assessment: Mild deficits observed, not formally tested                                           Pertinent Vitals/Pain Pain Assessment: Faces Faces Pain Scale: Hurts a little bit Pain Location: back Pain Descriptors / Indicators: Sore;Operative site guarding Pain Intervention(s): Limited activity within patient's tolerance;Monitored during session;Premedicated before session;Repositioned    Home Living Family/patient expects to be discharged to:: Private residence Living Arrangements: Alone Available Help at Discharge: Family;Available 24 hours/day(Son to stay with pt until sunday) Type of Home: Independent living facility Home Access: Level entry;Elevator     Home Layout: One level Home Equipment: Cane - single point      Prior Function Level of Independence: Independent         Comments: pt reports independence with ADLs, does not [employer] as [employer].     Hand Dominance  Dominant Hand: Right    Extremity/Trunk Assessment   Upper Extremity Assessment Upper Extremity Assessment: Overall WFL for tasks assessed    Lower Extremity Assessment Lower Extremity Assessment: Overall WFL for tasks  assessed;LLE deficits/detail;RLE deficits/detail RLE Deficits / Details: at least 3/5 hip flexion, knee extension/flexion; 4/5 DF RLE Sensation: WNL LLE Deficits / Details: at least 3/5 hip flexion, knee extension/flexion; 3+/5 DF. Pt reports foot drop L foot prior to surgery, but pt with full AROM anti-gravity LLE Sensation: WNL    Cervical / Trunk Assessment Cervical / Trunk Assessment: Kyphotic  Communication   Communication: No difficulties  Cognition Arousal/Alertness: Awake/alert Behavior During Therapy: WFL for tasks assessed/performed Overall Cognitive Status: Within Functional Limits for tasks assessed                                        General Comments      Exercises     Assessment/Plan    PT Assessment Patient needs continued PT services  PT Problem List Decreased strength;Decreased mobility;Decreased range of motion;Decreased activity tolerance;Decreased balance;Decreased knowledge of precautions;Decreased knowledge of use of DME;Pain       PT Treatment Interventions DME instruction;Therapeutic activities;Gait training;Therapeutic exercise;Patient/family education;Balance training;Functional mobility training    PT Goals (Current goals can be found in the Care Plan section)  Acute Rehab PT Goals Patient Stated Goal: return to independence, decrease back pain with mobility PT Goal Formulation: With patient Time For Goal Achievement: 10/03/18 Potential to Achieve Goals: Good    Frequency 7X/week   Barriers to discharge        Co-evaluation               AM-PAC PT "6 Clicks" Mobility  Outcome Measure Help needed turning from your back to your side while in a flat bed without using bedrails?: A Little Help needed moving from lying on your back to sitting on the side of a flat bed without using bedrails?: A Little Help needed moving to and from a bed to a chair (including a wheelchair)?: A Little Help needed standing up from a chair  using your arms (e.g., wheelchair or bedside chair)?: A Little Help needed to walk in hospital room?: A Little Help needed climbing 3-5 steps with a railing? : A Little 6 Click Score: 18    End of Session Equipment Utilized During Treatment: Gait belt Activity Tolerance: Patient tolerated treatment well Patient left: in chair;with chair alarm set;with call bell/phone within reach;with SCD's reapplied Nurse Communication: Mobility status PT Visit Diagnosis: Other abnormalities of gait and mobility (R26.89);Difficulty in walking, not elsewhere classified (R26.2)    Time: 8032-1224 PT Time Calculation (min) (ACUTE ONLY): 27 min   Charges:   PT Evaluation $PT Eval Low Complexity: 1 Low PT Treatments $Gait Training: 8-22 mins        Julien Girt, PT Acute Rehabilitation Services Pager [phone]  Office [place] 09/26/2018, 5:10 PM

## 2018-09-26 NOTE — Discharge Instructions (Signed)
For the first three days, remove your dressing, and tape a piece of saran wrap over your incision. Take your shower, then remove the saran wrap and put a clean dressing on. After three days you can shower without the saran wrap.  No lifting or excessive bending  No driving while taking pain medications.  Call Dr. Gladstone Lighter if any wound complications or temperature of 101 degrees F or over.  Call the office for an appointment to see Dr. Gladstone Lighter in two weeks: [phone] and ask for Dr. Charlestine Night nurse, Brunilda Payor.

## 2018-09-26 NOTE — Interval H&P Note (Signed)
History and Physical Interval Note:  09/26/2018 8:04 AM  Brenda Matthews  has presented today for surgery, with the diagnosis of Herniated lumbar disc L5-S1 Left.  The various methods of treatment have been discussed with the patient and family. After consideration of risks, benefits and other options for treatment, the patient has consented to  Procedure(s) with comments: Decompressive Lumbar Laminectomy and Microdiscectomy L5-S1 Left (N/A) - 140mn as a surgical intervention.  The patient's history has been reviewed, patient examined, no change in status, stable for surgery.  I have reviewed the patient's chart and labs.  Questions were answered to the patient's satisfaction.     RLatanya Maudlin

## 2018-09-26 NOTE — Plan of Care (Signed)

## 2018-09-26 NOTE — Anesthesia Postprocedure Evaluation (Signed)
Anesthesia Post Note  Patient: Brenda Matthews  Procedure(s) Performed: Decompressive Lumbar Laminectomy L5 S1 FORAMINOTOMY L5 S1  NERVE ROOT BILATERALLY and Microdiscectomy L5-S1 Left (N/A )     Patient location during evaluation: PACU Anesthesia Type: General Level of consciousness: awake and alert Pain management: pain level controlled Vital Signs Assessment: post-procedure vital signs reviewed and stable Respiratory status: spontaneous breathing, nonlabored ventilation, respiratory function stable and patient connected to nasal cannula oxygen Cardiovascular status: blood pressure returned to baseline and stable Postop Assessment: no apparent nausea or vomiting Anesthetic complications: no    Last Vitals:  Vitals:   09/26/18 1100 09/26/18 1115  BP: (!) 153/60 (!) 150/61  Pulse: (!) 47 (!) 49  Resp: 16 19  Temp:  (!) 36.3 C  SpO2: 96% 98%    Last Pain:  Vitals:   09/26/18 1115  TempSrc:   PainSc: 0-No pain    LLE Motor Response: Purposeful movement (09/26/18 1115) LLE Sensation: Full sensation;No numbness;No tingling (09/26/18 1115) RLE Motor Response: Purposeful movement (09/26/18 1115) RLE Sensation: Full sensation;No numbness;No tingling (09/26/18 1115)      Montez Hageman

## 2018-09-26 NOTE — Telephone Encounter (Signed)
Refill request

## 2018-09-26 NOTE — Addendum Note (Signed)
Addendum  created 09/26/18 1135 by Maxwell Caul, CRNA   Charge Capture section accepted

## 2018-09-26 NOTE — Brief Op Note (Signed)
09/26/2018  9:59 AM  PATIENT:  Brenda Matthews  83 y.o. female  PRE-OPERATIVE DIAGNOSIS:  Herniated lumbar disc L5-S1 Left.Spinal Stenosis L-5-S-1.Foraminal Stenosis involving the L-5 and S-1 Nerve Roots bilaterally  POST-OPERATIVE DIAGNOSIS:  Severe Spinal Stenosis at L-5-S-1 and Foraminal Stenosis involving the L-5 and S-1 Nerve Roots Bilaterally.  PROCEDURE:  Decompressive Lumbar Laminectomy at L-5-S-1 for  Severe Spinal Stenosis and exploration of the  L-5-S-1 Disc Space. Foraminotomies for the L-5 and S-1 Nerve Roots Bilaterally.  SURGEON:  Surgeon(s) and Role:    * Latanya Maudlin, MD - Primary  PHYSICIAN ASSISTANT:Amber Madison PA:   ASSISTANTS: Ardeen Jourdain PA  ANESTHESIA:   general  EBL:  50 mL   BLOOD ADMINISTERED:none  DRAINS: none   LOCAL MEDICATIONS USED:  MARCAINE20cc of 0.50% with Epinephrine  at the start of the case and 20cc of Exparel at the end of the Case     SPECIMEN:  No Specimen  DISPOSITION OF SPECIMEN:  N/A  COUNTS:  YES  TOURNIQUET:  * No tourniquets in log *  DICTATION: .Other Dictation: Dictation Number [id number]  PLAN OF CARE: Admit for overnight observation  PATIENT DISPOSITION:  PACU - hemodynamically stable.   Delay start of Pharmacological VTE agent (>24hrs) due to surgical blood loss or risk of bleeding: yes

## 2018-09-26 NOTE — Op Note (Signed)
NAME: Brenda Matthews, SCHEPER MEDICAL RECORD [id number] ACCOUNT [id number] DATE OF BIRTH:1935/05/25 FACILITY: [facility] LOCATION: [facility] PHYSICIAN:Swanson Farnell Fransico Setters, MD  OPERATIVE REPORT  DATE OF PROCEDURE:  09/26/2018  SURGEON:  Latanya Maudlin, MD  ASSISTANT:  Ardeen Jourdain, PA-C  PREOPERATIVE DIAGNOSES: 1.  Severe spinal stenosis with a complete block at L5-S1. 2.  Partial foot drop on the left. 3.  Rule out the possibility of a herniated disk versus bony overgrowth at L5-S1 on the left. 4.  Foraminal stenosis involving the L5 root on the left. 5.  Foraminal stenosis involving the S1 root on the left. 6.  Foraminal stenosis involving the L5 root on the right. 7.  Foraminal stenosis involving the S1 root on the right.  POSTOPERATIVE DIAGNOSES: 1.  Severe spinal stenosis with a complete block at S1. 2.  Partial foot drop on the left. 3.  There was no herniated disk.  This was simply severe recess stenosis at L5-S1 on the left. 4.  Foraminal stenosis involving the L5 root on the left. 5.  Foraminal stenosis involving the S1 root on the left. 6.  Foraminal stenosis involving the L5 root on the right.  7.  Foraminal stenosis involving the S1 root on the right.    DESCRIPTION OF PROCEDURE:  Under general anesthesia, the patient on the [place] frame, a routine orthopedic prep and draping on the lower back was carried out.  Appropriate timeout was carried out.  I did not need to mark the back because we were going  central and bilaterally.  At this time, 2 needles were placed in the back for localization purposes.  X-ray was taken.  An incision then was made over the L5-S1 interspace.  Bleeders identified and cauterized.  The incision was extended proximally and  distally.  At this time, the self-retaining retractors were inserted.  I then separated the muscle from the lamina and spinous processes bilaterally.  The first x-ray taken with a clamp that showed that we were on L4.  We wanted to be  1 level down.  We  then continued the dissection distally.  Another x-ray was taken to verify the exact position of the L5-S1.  Following that, we then inserted our Oolitic retractors.  Good hemostasis was maintained.  We then removed the spinous process of L5 at this  time and part of L4.  We then did a complete decompressive lumbar laminectomy at L5-S1.  We extended it somewhat proximally as well.  Following that, we identified the ligamentum flavum.  The microscope was brought in.  We protected the dura and then  removed the ligamentum flavum.  Note, we had a significant constriction at this area.  I then protected the dura with cottonoids.  I utilized the hockey stick and made sure we protected the dura at all times, and I went out far laterally and decompressed  the recesses at L5-S1.  At this time, we explored the disk with the [place] 4.  There was no herniated disk.  It was simply overgrowth of bone constricting the sac at that time.  I extended the dissection proximally and distally to make sure that the  L5 and the S1 roots were both free.  We were able to easily pass a hockey stick out the foramina of those roots.  There was no further decompression necessary.  I then extended the hockey stick proximally to make sure we did not need to go any further  proximally and also distally as well.  Thoroughly irrigated  out the area, loosely applied some thrombin-soaked Gelfoam, and I closed the wound in layers in the usual fashion except I left a small distal and proximal part of the deep part of the wound  open for drainage purposes.  Subcu was closed in the usual fashion.  Skin was closed with metal staples.  A sterile Neosporin dressing was applied.  Prior to surgery, she had 2 g of IV Ancef.  The plan is she will be kept overnight for observation.  LN/NUANCE  D:09/26/2018 T:09/26/2018 JOB:[id number]

## 2018-09-26 NOTE — Anesthesia Procedure Notes (Signed)
Procedure Name: Intubation Date/Time: 09/26/2018 8:30 AM Performed by: Maxwell Caul, CRNA Pre-anesthesia Checklist: Patient identified, Emergency Drugs available, Suction available and Patient being monitored Patient Re-evaluated:Patient Re-evaluated prior to induction Oxygen Delivery Method: Circle system utilized Preoxygenation: Pre-oxygenation with 100% oxygen Induction Type: IV induction Ventilation: Mask ventilation without difficulty Laryngoscope Size: Mac and 3 Grade View: Grade I Tube type: Oral Tube size: 7.0 mm Number of attempts: 1 Airway Equipment and Method: Stylet Placement Confirmation: ETT inserted through vocal cords under direct vision,  positive ETCO2 and breath sounds checked- equal and bilateral Secured at: 19 cm Tube secured with: Tape Dental Injury: Teeth and Oropharynx as per pre-operative assessment

## 2018-09-26 NOTE — Transfer of Care (Signed)
Immediate Anesthesia Transfer of Care Note  Patient: Brenda Matthews  Procedure(s) Performed: Decompressive Lumbar Laminectomy L5 S1 FORAMINOTOMY L5 S1  NERVE ROOT BILATERALLY and Microdiscectomy L5-S1 Left (N/A )  Patient Location: PACU  Anesthesia Type:General  Level of Consciousness: awake, alert  and oriented  Airway & Oxygen Therapy: Patient Spontanous Breathing and Patient connected to face mask oxygen  Post-op Assessment: Report given to RN and Post -op Vital signs reviewed and stable  Post vital signs: Reviewed and stable  Last Vitals:  Vitals Value Taken Time  BP 179/72 09/26/18 1016  Temp    Pulse 53 09/26/18 1019  Resp 16 09/26/18 1019  SpO2 100 % 09/26/18 1019  Vitals shown include unvalidated device data.  Last Pain:  Vitals:   09/26/18 0646  TempSrc: Oral         Complications: No apparent anesthesia complications

## 2018-09-27 ENCOUNTER — Encounter (HOSPITAL_COMMUNITY): Payer: Self-pay | Admitting: Orthopedic Surgery

## 2018-09-27 DIAGNOSIS — M4807 Spinal stenosis, lumbosacral region: Secondary | ICD-10-CM | POA: Diagnosis not present

## 2018-09-27 MED ORDER — METHOCARBAMOL 500 MG PO TABS: 500.0000 mg | ORAL_TABLET | Freq: Four times a day (QID) | ORAL | 0 refills | Status: DC | PRN

## 2018-09-27 MED ORDER — HYDROCODONE-ACETAMINOPHEN 5-325 MG PO TABS: 1.0000 | ORAL_TABLET | Freq: Four times a day (QID) | ORAL | 0 refills | Status: DC | PRN

## 2018-09-27 MED ORDER — HYDROCODONE-ACETAMINOPHEN 5-325 MG PO TABS: 1.0000 | ORAL_TABLET | ORAL | 0 refills | Status: DC | PRN

## 2018-09-27 NOTE — Discharge Summary (Signed)
Physician Discharge Summary   Patient ID: Brenda Matthews MRN: [id number] DOB/AGE: [birth year] 83 y.o.  Admit date: 09/26/2018 Discharge date: 09/27/2018  Primary Diagnosis:   Herniated lumbar disc L5-S1 Left  Admission Diagnoses:  Past Medical History:  Diagnosis Date   Abnormal EKG approx 2008   Nuclear stress test neg;    Anxiety    with panic   Arthritis    Autoimmune hepatitis ([place])    CAP (community acquired pneumonia) 08/2017   Hospitalization for CAP/acute diast HF/rapid a-fib   Cataract    s/p surgery--lens implants   Chronic diastolic heart failure ([place]) 2019   Chronic renal insufficiency, stage II (mild) 12/04/2012   GFR 60s.  Renal u/s when in hosp 08/2017 for CAP/CHF showed symmetric kidneys, echogenicity normal, w/out hydronephrosis.   Cirrhosis ([place])    DDD (degenerative disc disease), lumbar    Diverticulosis 2009   Dysrhythmia    Afib   Fracture of radial shaft, left, closed 11/16/10   fell down flight of stairs   History of kidney stones    Hx of adenomatous colonic polyps 2002   surveillance colonoscopy 2009, +polypectomy done-tubular adenoma w/out high grade.  05/2013 tubular adenomas--recall 3 yrs   Hyperlipidemia    Hypertension    Low TSH level 02/18/2016   T3 norm, T4 mildly elevated--suspected sick euthyroid syndrome.  Repeat labs 06/2016: normal.   Melanoma in situ ([place]) 06/2018   L LL   NASH (nonalcoholic steatohepatitis)    Osteopenia    DEXA 08/2010; repeat DEXA 02/2015 worse: fosamax started.  06/2018 Dexa T score -2.4.  2020 maj osteop fx risk = 24%, Hip fx risk 7.3%. Rpt 2 yrs.   PAF (paroxysmal atrial fibrillation) ([place]) 02/2016   when in post-op for ankle surgery; spontaneously converted in hosp, seen by Dr. Debara Pickett in consultation--metoprolol rate control + xarelto recommended.  Metop d/c due to hypot.  Plan to cont xarelto 20 mg qd indef due to CHAD-VASc score of 3.  A-fib w/RVR and CHF 07/2017; pt placed on amiodarone and plan  for CV, but pt was in sinus rhythm when she went in for her DC CV, so she was sent home.   Peripheral edema    Pneumonia 2015   hx with sepsis   Rheumatic fever    Discharge Diagnoses:   Active Problems:   Spinal stenosis, lumbar region with neurogenic claudication  Procedure:  Procedure(s) (LRB): Decompressive Lumbar Laminectomy L5 S1 FORAMINOTOMY L5 S1  NERVE ROOT BILATERALLY and Microdiscectomy L5-S1 Left (N/A)   Consults: None  HPI: The patient presented to the office after completing a series of [place] in both knees. She has pain in her lower legs more than in her bilateral knees. Her pain is mainly when she gets up out of bed. No known injury but she did fracture her left ankle years ago. She was seen by her pcp and had x-rays taken. Her pcp told her to wear tennis shoes, use icey hot and ordered some physical therapy for her. Physical therapy has been helping but it is making her left ankle worse. She takes tylenol arthritis for pain. She had ORIF of her left ankle 2 years ago with Dr. Doran Durand. She reports low back pain as well. CT myelogram showed a disc herniation and severe spinal stenosis at L5-S1.     Laboratory Data: Hospital Outpatient Visit on 09/22/2018  Component Date Value Ref Range Status   SARS-CoV-2, NAA 09/22/2018 NOT DETECTED  NOT DETECTED Final   Comment: (  NOTE) This nucleic acid amplification test was developed and its performance characteristics determined by [employer]. Nucleic acid amplification tests include PCR and TMA. This test has not been FDA cleared or approved. This test has been authorized by FDA under an Emergency Use Authorization (EUA). This test is only authorized for the duration of time the declaration that circumstances exist justifying the authorization of the emergency use of in vitro diagnostic tests for detection of SARS-CoV-2 virus and/or diagnosis of COVID-19 infection under section 564(b)(1) of the Act, 21 U.S.C.  237SEG-3(T) (1), unless the authorization is terminated or revoked sooner. When diagnostic testing is negative, the possibility of a false negative result should be considered in the context of a patient's recent exposures and the presence of clinical signs and symptoms consistent with COVID-19. An individual without symptoms of COVID- 19 and who is not shedding SARS-CoV-2 vi                          rus would expect to have a negative (not detected) result in this assay. Performed At: [facility] [place], [place] [id number] Rush Farmer MD [id number]    Coronavirus Source 09/22/2018 NASOPHARYNGEAL   Final   Performed at [facility], [place] [address], [place], [place] [address]    X-Rays:Dg Lumbar Spine 2-3 Views  Result Date: 09/21/2018 CLINICAL DATA:  Preop for lumbar spine surgery. EXAM: LUMBAR SPINE - 2-3 VIEW COMPARISON:  CT scan of September 12, 2018. FINDINGS: Stable minimal grade 1 anterolisthesis of L4-5 is noted. Severe degenerative disc disease is noted at L2-3, L3-4 and L4-5. Mild degenerative disc disease is noted at L1-2. Atherosclerosis of abdominal aorta is noted. IMPRESSION: Severe multilevel degenerative disc disease is noted. Minimal grade 1 anterolisthesis is noted at L4-5. Aortic Atherosclerosis (ICD10-I70.0). Electronically Signed   By: Marijo Conception M.D.   On: 09/21/2018 15:54   Ct Lumbar Spine W Contrast  Result Date: 09/12/2018 CLINICAL DATA:  Low back pain extending into the lower extremities bilaterally. Bilateral L5 radiculopathy. EXAM: LUMBAR MYELOGRAM FLUOROSCOPY TIME:  Radiation Exposure Index (as provided by the fluoroscopic device): 422.15 uGy*m2 PROCEDURE: After thorough discussion of risks and benefits of the procedure including bleeding, infection, injury to nerves, blood vessels, adjacent structures as well as headache and CSF leak, written and oral informed consent was obtained. Consent was obtained by Dr. San Morelle. Time out form was completed. Patient was positioned prone on the fluoroscopy table. Local anesthesia was provided with 1% lidocaine without epinephrine after prepped and draped in the usual sterile fashion. Puncture was performed at L1-2 using a 3 1/2 inch 22-gauge spinal needle via left paramedian approach. Using a single pass through the dura, the needle was placed within the thecal sac, with return of clear CSF. 15 mL of Isovue M-200 was injected into the thecal sac, with normal opacification of the nerve roots and cauda equina consistent with free flow within the subarachnoid space. I personally performed the lumbar puncture and administered the intrathecal contrast. I also personally supervised acquisition of the myelogram images. TECHNIQUE: Contiguous axial images were obtained through the Lumbar spine after the intrathecal infusion of infusion. Coronal and sagittal reconstructions were obtained of the axial image sets. COMPARISON:  Lumbar spine radiographs 06/29/2018. CT of the chest without contrast 09/03/2017. FINDINGS: LUMBAR MYELOGRAM FINDINGS: Is a relative block of contrast at the L5-S1 level on the initial prone images. L5-S1 and sacrum fills on the upright  standing images. Moderate central canal stenosis is present at L4-5 with left greater than right subarticular narrowing. Moderate central canal stenosis also present at L3-4 with right greater than left subarticular narrowing Slight retrolisthesis at L3-4 is stable with standing anterolisthesis at L4-5 and L5-S1 does not change significantly with standing. There is slight exaggerated anterolisthesis at both of these levels with flexion. Moderate subarticular narrowing at L2-3 is worse on the right. Mild subarticular narrowing at L1-2 is worse on left. CT LUMBAR MYELOGRAM FINDINGS: 5 non rib-bearing lumbar type vertebral bodies are present. The lowest fully formed vertebral body is L5. Slight retrolisthesis at L2-3 and L3-4 is stable from  the prone images. Anterolisthesis at L4-5 measures 5 mm. There is slight anterolisthesis at L5-S1. Atherosclerotic calcifications are present in the aorta without aneurysm. There is mild aneurysmal dilation proximal iliac arteries bilaterally. No significant adenopathy is present. Diverticular changes are present within the colon. A 5 cm hypodense lesion is present within the right lobe of the liver this has increased in size since the prior study. A second hepatic lesion may be present as well. The spinal cord terminates at L1-2. Vertebral body heights are maintained. Chronic endplate sclerotic changes are present diffusely at L2-3 and left greater than right at L3-4. There chronic endplate sclerotic changes diffusely at L4-5 and to a greater extent at L5-S1. Broad-based disc protrusion at T11-12 partially effaces the ventral CSF. Mild foraminal narrowing is worse on the left. T12-L1: Broad-based disc protrusion is present. No significant stenosis is present. L1-2: A broad-based disc protrusion is asymmetric to the left. Central canal is patent. Mild foraminal narrowing is worse left than right. L2-3: There is chronic loss of disc height. A broad-based disc protrusion and endplate osteophyte formation is present. Mild subarticular narrowing is worse left than right. Moderate right and mild left foraminal stenosis is present. L3-4: A broad-based disc protrusion is present. Mild left subarticular narrowing is present. Moderate foraminal narrowing bilaterally is worse on the right. L4-5: There is uncovering of a broad-based disc protrusion. Advanced facet hypertrophy and ligamentum flavum thickening is present. This results in mild subarticular narrowing bilaterally. Moderate foraminal stenosis is evident bilaterally. L5-S1: A superior disc extrusion is present. There is a vacuum disc with gas extending into the extruded segment posterior to the L5 vertebral body. This contributes to mild subarticular narrowing  bilaterally. Moderate to severe left and moderate right foraminal stenosis is present. IMPRESSION: 1. Large disc protrusion with superiorly extended extruded disc material at L5-S1 leads to moderate central with severe left and moderate right foraminal stenosis. 2. Grade 1 anterolisthesis with some dynamic exaggeration with flexion at L4-5 results in mild subarticular and moderate bilateral foraminal stenosis. The foraminal narrowing may be worse with flexion. 3. Mild left subarticular and moderate bilateral foraminal stenosis at L3-4. 4. Mild subarticular narrowing is worse on the left at L2-3. 5. Moderate right and mild left foraminal stenosis at L2-3. 6. Mild foraminal narrowing at L1-2 is worse on the left. Electronically Signed   By: San Morelle M.D.   On: 09/12/2018 12:36   Dg Spine Portable 1 View  Result Date: 09/26/2018 CLINICAL DATA:  Intraoperative localization EXAM: PORTABLE SPINE - 1 VIEW COMPARISON:  Film from earlier in the same day. FINDINGS: Surgical retractors and instruments are now seen posterior to the L5 and S1 vertebral bodies. The surgical instrument courses posterior to the S1 vertebral body. IMPRESSION: Intraoperative localization as described. Electronically Signed   By: Inez Catalina M.D.   On:  09/26/2018 09:46   Dg Spine Portable 1 View  Result Date: 09/26/2018 CLINICAL DATA:  Elective lumbar surgery. EXAM: PORTABLE SPINE - 1 VIEW COMPARISON:  Radiograph of same day.  CT scan of September 12, 2018. FINDINGS: Single intraoperative cross-table lateral projection of the lumbar spine demonstrates surgical probe directed toward posterior portion of L4-5. IMPRESSION: Surgical localization as described above. Electronically Signed   By: Marijo Conception M.D.   On: 09/26/2018 09:35   Dg Spine Portable 1 View  Result Date: 09/26/2018 CLINICAL DATA:  Intraoperative localization EXAM: PORTABLE SPINE - 1 VIEW COMPARISON:  09/21/2018 FINDINGS: Needles are noted adjacent to the spinous  processes just below the L3-4 interspace and in the midportion of the L5 vertebral body. The numbering nomenclature is similar to that utilized on the prior exam. Multilevel disc space narrowing from L2-S1 is seen. Persistent anterolisthesis of L4 on L5 is noted. IMPRESSION: Intraoperative localization as described. Electronically Signed   By: Inez Catalina M.D.   On: 09/26/2018 09:14   Dg Myelography Lumbar Inj Lumbosacral  Result Date: 09/12/2018 CLINICAL DATA:  Low back pain extending into the lower extremities bilaterally. Bilateral L5 radiculopathy. EXAM: LUMBAR MYELOGRAM FLUOROSCOPY TIME:  Radiation Exposure Index (as provided by the fluoroscopic device): 422.15 uGy*m2 PROCEDURE: After thorough discussion of risks and benefits of the procedure including bleeding, infection, injury to nerves, blood vessels, adjacent structures as well as headache and CSF leak, written and oral informed consent was obtained. Consent was obtained by Dr. San Morelle. Time out form was completed. Patient was positioned prone on the fluoroscopy table. Local anesthesia was provided with 1% lidocaine without epinephrine after prepped and draped in the usual sterile fashion. Puncture was performed at L1-2 using a 3 1/2 inch 22-gauge spinal needle via left paramedian approach. Using a single pass through the dura, the needle was placed within the thecal sac, with return of clear CSF. 15 mL of Isovue M-200 was injected into the thecal sac, with normal opacification of the nerve roots and cauda equina consistent with free flow within the subarachnoid space. I personally performed the lumbar puncture and administered the intrathecal contrast. I also personally supervised acquisition of the myelogram images. TECHNIQUE: Contiguous axial images were obtained through the Lumbar spine after the intrathecal infusion of infusion. Coronal and sagittal reconstructions were obtained of the axial image sets. COMPARISON:  Lumbar spine  radiographs 06/29/2018. CT of the chest without contrast 09/03/2017. FINDINGS: LUMBAR MYELOGRAM FINDINGS: Is a relative block of contrast at the L5-S1 level on the initial prone images. L5-S1 and sacrum fills on the upright standing images. Moderate central canal stenosis is present at L4-5 with left greater than right subarticular narrowing. Moderate central canal stenosis also present at L3-4 with right greater than left subarticular narrowing Slight retrolisthesis at L3-4 is stable with standing anterolisthesis at L4-5 and L5-S1 does not change significantly with standing. There is slight exaggerated anterolisthesis at both of these levels with flexion. Moderate subarticular narrowing at L2-3 is worse on the right. Mild subarticular narrowing at L1-2 is worse on left. CT LUMBAR MYELOGRAM FINDINGS: 5 non rib-bearing lumbar type vertebral bodies are present. The lowest fully formed vertebral body is L5. Slight retrolisthesis at L2-3 and L3-4 is stable from the prone images. Anterolisthesis at L4-5 measures 5 mm. There is slight anterolisthesis at L5-S1. Atherosclerotic calcifications are present in the aorta without aneurysm. There is mild aneurysmal dilation proximal iliac arteries bilaterally. No significant adenopathy is present. Diverticular changes are present within the colon.  A 5 cm hypodense lesion is present within the right lobe of the liver this has increased in size since the prior study. A second hepatic lesion may be present as well. The spinal cord terminates at L1-2. Vertebral body heights are maintained. Chronic endplate sclerotic changes are present diffusely at L2-3 and left greater than right at L3-4. There chronic endplate sclerotic changes diffusely at L4-5 and to a greater extent at L5-S1. Broad-based disc protrusion at T11-12 partially effaces the ventral CSF. Mild foraminal narrowing is worse on the left. T12-L1: Broad-based disc protrusion is present. No significant stenosis is present.  L1-2: A broad-based disc protrusion is asymmetric to the left. Central canal is patent. Mild foraminal narrowing is worse left than right. L2-3: There is chronic loss of disc height. A broad-based disc protrusion and endplate osteophyte formation is present. Mild subarticular narrowing is worse left than right. Moderate right and mild left foraminal stenosis is present. L3-4: A broad-based disc protrusion is present. Mild left subarticular narrowing is present. Moderate foraminal narrowing bilaterally is worse on the right. L4-5: There is uncovering of a broad-based disc protrusion. Advanced facet hypertrophy and ligamentum flavum thickening is present. This results in mild subarticular narrowing bilaterally. Moderate foraminal stenosis is evident bilaterally. L5-S1: A superior disc extrusion is present. There is a vacuum disc with gas extending into the extruded segment posterior to the L5 vertebral body. This contributes to mild subarticular narrowing bilaterally. Moderate to severe left and moderate right foraminal stenosis is present. IMPRESSION: 1. Large disc protrusion with superiorly extended extruded disc material at L5-S1 leads to moderate central with severe left and moderate right foraminal stenosis. 2. Grade 1 anterolisthesis with some dynamic exaggeration with flexion at L4-5 results in mild subarticular and moderate bilateral foraminal stenosis. The foraminal narrowing may be worse with flexion. 3. Mild left subarticular and moderate bilateral foraminal stenosis at L3-4. 4. Mild subarticular narrowing is worse on the left at L2-3. 5. Moderate right and mild left foraminal stenosis at L2-3. 6. Mild foraminal narrowing at L1-2 is worse on the left. Electronically Signed   By: San Morelle M.D.   On: 09/12/2018 12:36    EKG: Orders placed or performed during the hospital encounter of 09/21/18   EKG   EKG     Hospital Course: Patient was admitted to [facility] and taken to the  OR and underwent the above state procedure without complications.  Patient tolerated the procedure well and was later transferred to the recovery room and then to the orthopaedic floor for postoperative care.  They were given PO and IV analgesics for pain control following their surgery.  They were given 24 hours of postoperative antibiotics.   PT was consulted postop to assist with mobility and transfers.  The patient was allowed to be WBAT with therapy and was taught back precautions. Discharge planning was consulted to help with postop disposition and equipment needs.  Patient had a good night on the evening of surgery and started to get up OOB with therapy on day one. Patient was seen in rounds and was ready to go home on day one.  They were given discharge instructions and dressing directions.  They were instructed on when to follow up in the office with Dr. Gladstone Lighter in two weeks.   Diet: Cardiac diet Activity:WBAT Follow-up:in 2 weeks Disposition - Home Discharged Condition: stable   Discharge Instructions    Call MD / Call 911   Complete by: As directed    If  you experience chest pain or shortness of breath, CALL 911 and be transported to the hospital emergency room.  If you develope a fever above 101 F, pus (white drainage) or increased drainage or redness at the wound, or calf pain, call your surgeon's office.   Constipation Prevention   Complete by: As directed    Drink plenty of fluids.  Prune juice may be helpful.  You may use a stool softener, such as Colace (over the counter) 100 mg twice a day.  Use MiraLax (over the counter) for constipation as needed.   Diet - low sodium heart healthy   Complete by: As directed    Discharge instructions   Complete by: As directed    For the first three days, remove your dressing, and tape a piece of saran wrap over your incision. Take your shower, then remove the saran wrap and put a clean dressing on. After three days you can shower without  the saran wrap.  No lifting or excessive bending  No driving while taking pain medications.  Call Dr. Gladstone Lighter if any wound complications or temperature of 101 degrees F or over.  Call the office for an appointment to see Dr. Gladstone Lighter in two weeks: [phone] and ask for Dr. Charlestine Night nurse, Brunilda Payor.   Incentive spirometry RT   Complete by: As directed    Increase activity slowly as tolerated   Complete by: As directed      Allergies as of 09/27/2018      Reactions   Augmentin [amoxicillin-pot Clavulanate] Nausea And Vomiting, Other (See Comments)   "projectile vomiting" Has patient had a PCN reaction causing immediate rash, facial/tongue/throat swelling, SOB or lightheadedness with hypotension:No Has patient had a PCN reaction causing severe rash involving mucus membranes or skin necrosis:No Has patient had a PCN reaction that required hospitalization:No Has patient had a PCN reaction occurring within the last 10 years:Yes If all of the above answers are "NO", then may proceed with Cephalosporin use.      Medication List    STOP taking these medications   acetaminophen 650 MG CR tablet Commonly known as: TYLENOL   alendronate 70 MG tablet Commonly known as: FOSAMAX   oxyCODONE-acetaminophen 5-325 MG tablet Commonly known as: PERCOCET/ROXICET     TAKE these medications   albuterol 108 (90 Base) MCG/ACT inhaler Commonly known as: VENTOLIN HFA Inhale 2 puffs into the lungs every 4 (four) hours as needed for wheezing or shortness of breath.   ALPRAZolam 0.5 MG tablet Commonly known as: XANAX TAKE 1 TABLET BY MOUTH THREE TIMES DAILY IF NEEDED FOR STRESS What changed: See the new instructions.   amiodarone 100 MG tablet Commonly known as: PACERONE Take 1 tablet (100 mg total) by mouth daily.   atorvastatin 40 MG tablet Commonly known as: LIPITOR TAKE 1 TABLET BY MOUTH EVERY DAY What changed: when to take this   Biotin 5000 MCG Tabs Take 5,000 mcg by mouth  daily.   CALCIUM 600 PO Take 1,200 mg by mouth daily.   carboxymethylcellulose 0.5 % Soln Commonly known as: REFRESH PLUS Place 2 drops into both eyes daily as needed (dry/irritated eyes.).   Centrum Silver Ultra Womens Tabs Take 1 tablet by mouth every evening.   AIRBORNE PO Take 1 tablet by mouth daily as needed (immune support).   CRANBERRY PO Take 2 tablets by mouth daily.   [employer] CoQ-10 200 MG capsule Generic drug: Coenzyme Q10 Take 200 mg by mouth daily.   docusate sodium 100 MG  capsule Commonly known as: COLACE Take 100 mg by mouth daily as needed (constipation.).   fluticasone 50 MCG/ACT nasal spray Commonly known as: FLONASE Place 2 sprays into both nostrils daily.   fluticasone 50 MCG/ACT nasal spray Commonly known as: FLONASE Place into both nostrils daily.   furosemide 40 MG tablet Commonly known as: LASIX Take 1 tablet (40 mg total) by mouth daily.   hydrALAZINE 10 MG tablet Commonly known as: APRESOLINE Take 2 tablets (20 mg total) by mouth 3 (three) times daily.   HYDROcodone-acetaminophen 5-325 MG tablet Commonly known as: NORCO/VICODIN Take 1 tablet by mouth every 4 (four) hours as needed for moderate pain ((score 4 to 6)). What changed:   how much to take  when to take this  reasons to take this   Cumby 1 application topically every 4 (four) hours as needed (calf pain).   methocarbamol 500 MG tablet Commonly known as: ROBAXIN Take 1 tablet (500 mg total) by mouth every 6 (six) hours as needed for muscle spasms.   metoprolol tartrate 100 MG tablet Commonly known as: LOPRESSOR TAKE 1 TABLET BY MOUTH 2 TIMES DAILY   potassium chloride SA 20 MEQ tablet Commonly known as: K-DUR Take 20 mEq by mouth 2 (two) times daily.   PREVAGEN PO Take 1 tablet by mouth daily.   sodium chloride 0.65 % nasal spray Commonly known as: OCEAN Place 2 sprays into the nose daily. After shower   Turmeric 500 MG Caps Take 500 mg by mouth  daily.   Xarelto 15 MG Tabs tablet Generic drug: Rivaroxaban TAKE 1 TABLET BY MOUTH EVERY DAY WITH SUPPER What changed: See the new instructions.      Follow-up Information    Latanya Maudlin, MD. Schedule an appointment as soon as possible for a visit in 2 week(s).   Specialty: Orthopedic Surgery Contact information: [address] [place] [address] [phone]           Signed: Ardeen Jourdain, PA-C Orthopaedic Surgery 09/27/2018, 7:08 AM

## 2018-09-27 NOTE — Progress Notes (Signed)
Physical Therapy Treatment Patient Details Name: Brenda Matthews MRN: [id number] DOB: 1935-06-28 Today's Date: 09/27/2018    History of Present Illness 83 yo female s/p L5-S1 decompression, laminectomy, microdiscectomy on 09/26/18. PMH includes anxiety, autoimmune hepatitis, CAP, diastolic HF, HLD, HTN, osteopenia, ORIF tib/fib    PT Comments    Pt progressing well with mobility but requiring continued cued for adherence to back precautions.  Pt eager for return home.   Follow Up Recommendations  Follow surgeon's recommendation for DC plan and follow-up therapies;Supervision for mobility/OOB     Equipment Recommendations  Rolling walker with 5" wheels(youth RW please)    Recommendations for Other Services       Precautions / Restrictions Precautions Precautions: Fall;Back Precaution Booklet Issued: Yes (comment) Precaution Comments: reviewed precautions; pt verbalizes Restrictions Weight Bearing Restrictions: No    Mobility  Bed Mobility Overal bed mobility: Needs Assistance Bed Mobility: Rolling;Sidelying to Sit Rolling: Supervision Sidelying to sit: Supervision     Sit to sidelying: Supervision General bed mobility comments: cues for technique and adherence to back precautions  Transfers Overall transfer level: Needs assistance Equipment used: Rolling walker (2 wheeled) Transfers: Sit to/from Stand Sit to Stand: Supervision         General transfer comment: cues for LE management, use of UEs to self assist and adherence to THP  Ambulation/Gait Ambulation/Gait assistance: Min guard;Supervision Gait Distance (Feet): 400 Feet Assistive device: Rolling walker (2 wheeled) Gait Pattern/deviations: Step-through pattern;Decreased stride length;Trunk flexed Gait velocity: decr   General Gait Details: cues for posture and position from RW; pt ambulated short distance without RW but with noted decreased stability.  Pt agrees she is safer with RW initially.   Stairs              Wheelchair Mobility    Modified Rankin (Stroke Patients Only)       Balance Overall balance assessment: Mild deficits observed, not formally tested                                          Cognition Arousal/Alertness: Awake/alert Behavior During Therapy: WFL for tasks assessed/performed Overall Cognitive Status: Within Functional Limits for tasks assessed                                 General Comments: mostly WFLs; asked OT same questions a few times, may be due to anxiety about leaving compression hose off vs getting help with R. Also focused on getting reacher at home      Exercises      General Comments        Pertinent Vitals/Pain Pain Assessment: 0-10 Pain Score: 3  Faces Pain Scale: Hurts a little bit Pain Location: back Pain Descriptors / Indicators: Sore Pain Intervention(s): Limited activity within patient's tolerance;Monitored during session;Premedicated before session    Home Living Family/patient expects to be discharged to:: Private residence Living Arrangements: Alone             Additional Comments: pt from [place]; son here til Sunday    Prior Function Level of Independence: Independent          PT Goals (current goals can now be found in the care plan section) Acute Rehab PT Goals Patient Stated Goal: return to independence, decrease back pain with mobility PT Goal Formulation: With patient Time  For Goal Achievement: 10/03/18 Potential to Achieve Goals: Good Progress towards PT goals: Progressing toward goals    Frequency    7X/week      PT Plan Current plan remains appropriate    Co-evaluation              AM-PAC PT "6 Clicks" Mobility   Outcome Measure  Help needed turning from your back to your side while in a flat bed without using bedrails?: A Little Help needed moving from lying on your back to sitting on the side of a flat bed without using bedrails?: A  Little Help needed moving to and from a bed to a chair (including a wheelchair)?: A Little Help needed standing up from a chair using your arms (e.g., wheelchair or bedside chair)?: A Little Help needed to walk in hospital room?: A Little Help needed climbing 3-5 steps with a railing? : A Little 6 Click Score: 18    End of Session Equipment Utilized During Treatment: Gait belt Activity Tolerance: Patient tolerated treatment well Patient left: Other (comment)(bathroom with OT) Nurse Communication: Mobility status PT Visit Diagnosis: Other abnormalities of gait and mobility (R26.89);Difficulty in walking, not elsewhere classified (R26.2)     Time: 3734-2876 PT Time Calculation (min) (ACUTE ONLY): 20 min  Charges:  $Gait Training: 8-22 mins                     [place] Pager [phone] Office [phone]    Dudley Cooley 09/27/2018, 12:28 PM

## 2018-09-27 NOTE — Progress Notes (Signed)
Very minimal Pain.Subjective: 1 Day Post-Op Procedure(s) (LRB): Decompressive Lumbar Laminectomy L5 S1 FORAMINOTOMY L5 S1  NERVE ROOT BILATERALLY and Microdiscectomy L5-S1 Left (N/A) Patient reports pain as 1 on 0-10 scaleVery minimal Pain She is taking various herbal and vitamin products, Doing well. Dorsiflexors are now normal. She has no leg pain when she walks. Will DC.    Objective: Vital signs in last 24 hours: Temp:  [97.4 F (36.3 C)-98 F (36.7 C)] 98 F (36.7 C) (09/10 0552) Pulse Rate:  [47-57] 57 (09/10 0552) Resp:  [16-21] 18 (09/10 0552) BP: (124-179)/(56-72) 137/63 (09/10 0552) SpO2:  [93 %-100 %] 95 % (09/10 0552) Weight:  [77.5 kg] 77.5 kg (09/09 1404)  Intake/Output from previous day: 09/09 0701 - 09/10 0700 In: 3500.3 [P.O.:780; I.V.:2570.3; IV Piggyback:150] Out: 1600 [Urine:1550; Blood:50] Intake/Output this shift: No intake/output data recorded.  No results for input(s): HGB in the last 72 hours. No results for input(s): WBC, RBC, HCT, PLT in the last 72 hours. No results for input(s): NA, K, CL, CO2, BUN, CREATININE, GLUCOSE, CALCIUM in the last 72 hours. No results for input(s): LABPT, INR in the last 72 hours.  Neurologically intact Dorsiflexion/Plantar flexion intact   Assessment/Plan: 1 Day Post-Op Procedure(s) (LRB): Decompressive Lumbar Laminectomy L5 S1 FORAMINOTOMY L5 S1  NERVE ROOT BILATERALLY and Microdiscectomy L5-S1 Left (N/A) Up with therapy.Will DC. Office in two weeks.      Tharon Kitch L Hilton Saephan 09/27/2018, 7:19 AM

## 2018-09-27 NOTE — Evaluation (Signed)
Occupational Therapy Evaluation Patient Details Name: Brenda Matthews MRN: [id number] DOB: May 01, 1935 Today's Date: 09/27/2018    History of Present Illness 83 yo female s/p L5-S1 decompression, laminectomy, microdiscectomy on 09/26/18. PMH includes anxiety, autoimmune hepatitis, CAP, diastolic HF, HLD, HTN, osteopenia, ORIF tib/fib   Clinical Impression   Pt was admitted for the above. At baseline, she lives at [facility] and is independent with adls. She needs mostly set up with occasional min A at this time. Pt's son will stay x 3 days to assist her. A neighbor will help with dressing/showering. Pt's concern is ted hose on R.  She plans to get a reacher.    Follow Up Recommendations  Supervision/Assistance - 24 hour(initially, then intermittent; A for ted on RLE)    Equipment Recommendations  None recommended by OT    Recommendations for Other Services       Precautions / Restrictions Precautions Precautions: Fall;Back Precaution Comments: reviewd precautions; pt verbalizes Restrictions Weight Bearing Restrictions: No      Mobility Bed Mobility             Sit to sidelying: Supervision General bed mobility comments: cues for technique. Pt started to bring shoulder back  Transfers   Equipment used: Rolling walker (2 wheeled)   Sit to Stand: Supervision         General transfer comment: cues one time for UE placement. Stood at least 6 times during adl/ot session    Balance                                           ADL either performed or assessed with clinical judgement   ADL Overall ADL's : Needs assistance/impaired Eating/Feeding: Independent   Grooming: Wash/dry hands;Standing;Supervision/safety   Upper Body Bathing: Set up   Lower Body Bathing: Supervison/ safety;Sit to/from stand   Upper Body Dressing : Set up   Lower Body Dressing: Minimal assistance   Toilet Transfer: Min guard;Supervision/safety;Ambulation;Comfort  height toilet;Grab bars   Toileting- Clothing Manipulation and Hygiene: Minimal assistance;Sit to/from stand         General ADL Comments: performed ADL.  Able to cross legs for LB adls but R leg is more difficult and she cannot don compression hose on this leg. Practiced with reacher to decrease stress. Pt states her friend willcome over when she showers and apply sara wrap to dressing. Practiced pants 3x's; reacher, supine (hard) and crossing legs.  R takes more effort, but she was able to do without pain.     Vision         Perception     Praxis      Pertinent Vitals/Pain Pain Assessment: Faces Faces Pain Scale: Hurts a little bit Pain Location: back Pain Descriptors / Indicators: Sore Pain Intervention(s): Limited activity within patient's tolerance;Monitored during session;Repositioned     Hand Dominance     Extremity/Trunk Assessment Upper Extremity Assessment Upper Extremity Assessment: Overall WFL for tasks assessed           Communication Communication Communication: No difficulties   Cognition Arousal/Alertness: Awake/alert Behavior During Therapy: WFL for tasks assessed/performed                                   General Comments: mostly WFLs; asked OT same questions a few times, may be due to  anxiety about leaving compression hose off vs getting help with R. Also focused on getting reacher at home   General Comments       Exercises     Shoulder Instructions      Home Living Family/patient expects to be discharged to:: Private residence Living Arrangements: Alone                               Additional Comments: pt from [place]; son here til Sunday      Prior Functioning/Environment Level of Independence: Independent                 OT Problem List:        OT Treatment/Interventions:      OT Goals(Current goals can be found in the care plan section) Acute Rehab OT Goals Patient Stated Goal:  return to independence, decrease back pain with mobility OT Goal Formulation: All assessment and education complete, DC therapy  OT Frequency:     Barriers to D/C:            Co-evaluation              AM-PAC OT "6 Clicks" Daily Activity     Outcome Measure Help from another person eating meals?: None Help from another person taking care of personal grooming?: None Help from another person toileting, which includes using toliet, bedpan, or urinal?: A Little Help from another person bathing (including washing, rinsing, drying)?: A Little Help from another person to put on and taking off regular upper body clothing?: A Little Help from another person to put on and taking off regular lower body clothing?: A Little 6 Click Score: 20   End of Session    Activity Tolerance: Patient tolerated treatment well Patient left: in chair;with call bell/phone within reach;with chair alarm set  OT Visit Diagnosis: Muscle weakness (generalized) (M62.81)                Time: 2549-8264 OT Time Calculation (min): 44 min Charges:  OT General Charges $OT Visit: 1 Visit OT Evaluation $OT Eval Low Complexity: 1 Low OT Treatments $Self Care/Home Management : 23-37 mins  Lesle Chris, OTR/L Acute Rehabilitation Services [phone] [facility] pager [phone] office 09/27/2018  [place] 09/27/2018, 9:26 AM

## 2018-09-27 NOTE — TOC Transition Note (Signed)
Transition of Care [facility]) - CM/SW Discharge Note   Patient Details  Name: Brenda Matthews MRN: [id number] Date of Birth: 02-16-1935  [facility]) CM/SW Contact:  Lia Hopping, [place] Phone Number: 09/27/2018, 9:58 AM   Clinical Narrative:    Youth RW ordered through [employer], walker will be delivered to the patient room      Barriers to Discharge: No Barriers Identified   Patient Goals and CMS Choice        Discharge Placement  Home                      Discharge Plan and Services                DME Arranged: [facility] youth DME Agency: [employer] Date DME Agency Contacted: 09/27/18 Time DME Agency Contacted: [phone] Representative spoke with at DME Agency: [place] Determinants of Health ([place]) Interventions     Readmission Risk Interventions No flowsheet data found.

## 2018-09-27 NOTE — Plan of Care (Signed)

## 2018-09-28 ENCOUNTER — Other Ambulatory Visit: Payer: Self-pay | Admitting: Family Medicine

## 2018-09-28 DIAGNOSIS — I130 Hypertensive heart and chronic kidney disease with heart failure and stage 1 through stage 4 chronic kidney disease, or unspecified chronic kidney disease: Secondary | ICD-10-CM | POA: Insufficient documentation

## 2018-10-03 ENCOUNTER — Ambulatory Visit: Payer: Medicare Other | Admitting: Physician Assistant

## 2018-10-08 ENCOUNTER — Telehealth: Payer: Self-pay | Admitting: Family Medicine

## 2018-10-08 ENCOUNTER — Ambulatory Visit: Payer: Medicare Other | Admitting: Family Medicine

## 2018-10-08 ENCOUNTER — Ambulatory Visit: Payer: Medicare Other | Admitting: Family

## 2018-10-08 NOTE — Telephone Encounter (Signed)
Patient would like to have an in person visit to discuss past kidney function test results. Patient would like to have a visit prior to 10/17/18. Is there anywhere we can work her in?

## 2018-10-08 NOTE — Telephone Encounter (Signed)
PCP scheduled checked, no availability unless worked in.   Please advise, thanks.

## 2018-10-08 NOTE — Telephone Encounter (Signed)
No, sorry--this is not a work-in appt.

## 2018-10-09 NOTE — Telephone Encounter (Signed)
Pt was contacted and scheduled for in office appt, 10/5

## 2018-10-17 ENCOUNTER — Ambulatory Visit: Payer: Medicare Other | Admitting: Family Medicine

## 2018-10-17 ENCOUNTER — Ambulatory Visit (INDEPENDENT_AMBULATORY_CARE_PROVIDER_SITE_OTHER): Payer: Medicare Other | Admitting: Physician Assistant

## 2018-10-17 ENCOUNTER — Telehealth: Payer: Self-pay

## 2018-10-17 ENCOUNTER — Other Ambulatory Visit: Payer: Self-pay

## 2018-10-17 ENCOUNTER — Encounter: Payer: Self-pay | Admitting: Physician Assistant

## 2018-10-17 VITALS — BP 158/80 | HR 68 | Temp 98.7°F | Ht 61.0 in | Wt 164.0 lb

## 2018-10-17 DIAGNOSIS — Z86010 Personal history of colonic polyps: Secondary | ICD-10-CM | POA: Diagnosis not present

## 2018-10-17 DIAGNOSIS — Z7901 Long term (current) use of anticoagulants: Secondary | ICD-10-CM

## 2018-10-17 NOTE — Telephone Encounter (Signed)
[facility] Pre-operative Risk Assessment     Request for surgical clearance:     Endoscopy Procedure  What type of surgery is being performed?     colonoscopy  When is this surgery scheduled?     12/18/2018  What type of clearance is required ?   Pharmacy  Are there any medications that need to be held prior to surgery and how long? Xarelto, for 2 days  Practice name and name of physician performing surgery?      [facility]  What is your office phone and fax number?      Phone- [phone]  [phone]  Anesthesia type (None, local, MAC, general) ?       MAC

## 2018-10-17 NOTE — Telephone Encounter (Signed)
Please comment on xarelto. 

## 2018-10-17 NOTE — Patient Instructions (Signed)
You have been scheduled for a colonoscopy. Please follow written instructions given to you at your visit today.  Please pick up your prep supplies at the pharmacy within the next 1-3 days. If you use inhalers (even only as needed), please bring them with you on the day of your procedure.   You will be contacted by our office prior to your procedure for directions on holding your xarelto.  If you do not hear from our office 1 week prior to your scheduled procedure, please call [phone] to discuss.    I appreciate the opportunity to care for you. Ellouise Newer, PA-C

## 2018-10-17 NOTE — Progress Notes (Signed)
Chief Complaint:  Screening for colon cancer  HPI:    [name] is an 83 year old female with a past medical history as listed below, including A. fib and CHF (echo 08/10/2017 with LVEF 50-55%) known to Dr. Henrene Pastor, who was referred to me by McGowen, Adrian Blackwater, MD for discussion of her screening colonoscopy..      05/30/2013 colonoscopy for personal history of adenomatous polyps in 2002 in 2009.  Finding of 3 polyps in the ascending and transverse colon and severe diverticulosis in left colon.  Repeat recommended in 3 years if medically fit.    Today, the patient explains that she is doing fairly well.  She was diagnosed with A. fib since being seen last in our clinic but this has been controlled and she has had no problems.  Was started on Xarelto.  Tells me she had to stop this recently for back surgery which she had for spinal stenosis of the lumbar spine.  This was done 09/26/2018.  She did well and went through rehab and is now able to walk again without a walker.  She lives on her own and feels good and "I do not want to die of cancer", so she would like to have her colonoscopy.    Denies fever, chills, weight loss, change in bowel habits, blood in her stool or abdominal pain.  Past Medical History:  Diagnosis Date  . Abnormal EKG approx 2008   Nuclear stress test neg;   . Anxiety    with panic  . Arthritis   . Autoimmune hepatitis ([place])   . CAP (community acquired pneumonia) 08/2017   Hospitalization for CAP/acute diast HF/rapid a-fib  . Cataract    s/p surgery--lens implants  . Chronic diastolic heart failure ([place]) 2019  . Chronic renal insufficiency, stage II (mild) 12/04/2012   GFR 60s.  Renal u/s when in hosp 08/2017 for CAP/CHF showed symmetric kidneys, echogenicity normal, w/out hydronephrosis.  . Cirrhosis ([place])   . DDD (degenerative disc disease), lumbar   . Diverticulosis 2009  . Dysrhythmia    Afib  . Fracture of radial shaft, left, closed 11/16/10   fell down flight of  stairs  . History of kidney stones   . Hx of adenomatous colonic polyps 2002   surveillance colonoscopy 2009, +polypectomy done-tubular adenoma w/out high grade.  05/2013 tubular adenomas--recall 3 yrs  . Hyperlipidemia   . Hypertension   . Low TSH level 02/18/2016   T3 norm, T4 mildly elevated--suspected sick euthyroid syndrome.  Repeat labs 06/2016: normal.  . Melanoma in situ ([place]) 06/2018   L LL  . NASH (nonalcoholic steatohepatitis)   . Osteopenia    DEXA 08/2010; repeat DEXA 02/2015 worse: fosamax started.  06/2018 Dexa T score -2.4.  2020 maj osteop fx risk = 24%, Hip fx risk 7.3%. Rpt 2 yrs.  Marland Kitchen PAF (paroxysmal atrial fibrillation) ([place]) 02/2016   when in post-op for ankle surgery; spontaneously converted in hosp, seen by Dr. Debara Pickett in consultation--metoprolol rate control + xarelto recommended.  Metop d/c due to hypot.  Plan to cont xarelto 20 mg qd indef due to CHAD-VASc score of 3.  A-fib w/RVR and CHF 07/2017; pt placed on amiodarone and plan for CV, but pt was in sinus rhythm when she went in for her DC CV, so she was sent home.  . Peripheral edema   . Pneumonia 2015   hx with sepsis  . Rheumatic fever     Past Surgical History:  Procedure Laterality Date  .  APPENDECTOMY  1966   done during surgery for tubal pregnancy  . CATARACT EXTRACTION W/ INTRAOCULAR LENS IMPLANT  2013   bilat  . COLONOSCOPY W/ POLYPECTOMY  05/2013   +diverticulosis; recall 3 yrs (Dr. Henrene Pastor)  . DEXA  02/2015; 06/2018   T score -2.1 in both femoral necks; FRAX 10 yr risk of major osteoporotic fracture was 21%---fosamax started. 06/2018 T score -2.4.  repeat 1 yr.  Marland Kitchen EYE SURGERY    . LEFT HEART CATH AND CORONARY ANGIOGRAPHY N/A 09/01/2017   No angiographically significant CAD.  Upper normal left ventricular filling pressure.  Procedure: LEFT HEART CATH AND CORONARY ANGIOGRAPHY;  Surgeon: Nelva Bush, MD;  Location: [facility];  Service: Cardiovascular;  Laterality: N/A;  . LUMBAR  LAMINECTOMY/DECOMPRESSION MICRODISCECTOMY N/A 09/26/2018   Procedure: Decompressive Lumbar Laminectomy L5 S1 FORAMINOTOMY L5 S1  NERVE ROOT BILATERALLY and Microdiscectomy L5-S1 Left;  Surgeon: Latanya Maudlin, MD;  Location: [facility];  Service: Orthopedics;  Laterality: N/A;  178mn  . OPEN REDUCTION INTERNAL FIXATION (ORIF) TIBIA/FIBULA FRACTURE Left 02/18/2016   Procedure: OPEN REDUCTION INTERNAL FIXATION (ORIF) Right ankle trimalleolar fracture;  Surgeon: JWylene Simmer MD;  Location: [facility]  Service: Orthopedics;  Laterality: Left;  requests 968ms  . ORIF RADIAL FRACTURE  11/18/10   left; s/p slip on slippery floor and fell  . THORACENTESIS  08/2017   diagnostic and therapeutic.  Transudative.  Clx neg.  (+pulm edema/diastolic HF)  . TONSILLECTOMY    . TRANSTHORACIC ECHOCARDIOGRAM  02/18/2016; 08/10/17   LVEF of 55-60%, mild AI and mild MR and normal biatrial size.  07/2017--normal LV function, mild enlarge aortic root, mild/mod TR, bilat atrial enlargement.    Current Outpatient Medications  Medication Sig Dispense Refill  . albuterol (PROVENTIL HFA;VENTOLIN HFA) 108 (90 Base) MCG/ACT inhaler Inhale 2 puffs into the lungs every 4 (four) hours as needed for wheezing or shortness of breath. 8.5 g 1  . ALPRAZolam (XANAX) 0.5 MG tablet TAKE 1 TABLET BY MOUTH THREE TIMES DAILY IF NEEDED FOR STRESS (Patient taking differently: Take 0.5 mg by mouth 3 (three) times daily as needed (stress). ) 90 tablet 5  . amiodarone (PACERONE) 100 MG tablet Take 1 tablet (100 mg total) by mouth daily.    . Marland Kitchenpoaequorin (PREVAGEN PO) Take 1 tablet by mouth daily.    . [place] Kitchentorvastatin (LIPITOR) 40 MG tablet TAKE 1 TABLET BY MOUTH EVERY DAY (Patient taking differently: Take 40 mg by mouth every evening. ) 90 tablet 1  . Biotin 5000 MCG TABS Take 5,000 mcg by mouth daily.    . Calcium Carbonate (CALCIUM 600 PO) Take 1,200 mg by mouth daily.     . carboxymethylcellulose (REFRESH PLUS) 0.5 % SOLN Place 2 drops into both eyes daily  as needed (dry/irritated eyes.).     . [place] Kitchenoenzyme Q10 ([employer] COQ-10) 200 MG capsule Take 200 mg by mouth daily.    . [place] KitchenRANBERRY PO Take 2 tablets by mouth daily.    . Mar[place] Kitchenocusate sodium (COLACE) 100 MG capsule Take 100 mg by mouth daily as needed (constipation.).     . Marland Kitchenluticasone (FLONASE) 50 MCG/ACT nasal spray Place 2 sprays into both nostrils daily.     . fluticasone (FLONASE) 50 MCG/ACT nasal spray Place into both nostrils daily.    . furosemide (LASIX) 40 MG tablet TAKE 1 TABLET BY MOUTH EVERY DAY 90 tablet 1  . hydrALAZINE (APRESOLINE) 10 MG tablet Take 2 tablets (20 mg total) by mouth 3 (three) times daily. 540 tablet  3  . HYDROcodone-acetaminophen (NORCO/VICODIN) 5-325 MG tablet Take 1 tablet by mouth every 6 (six) hours as needed for moderate pain ((score 4 to 6)). 30 tablet 0  . Menthol, Topical Analgesic, (ICY HOT EX) Apply 1 application topically every 4 (four) hours as needed (calf pain).    . metoprolol tartrate (LOPRESSOR) 100 MG tablet TAKE 1 TABLET BY MOUTH 2 TIMES DAILY (Patient taking differently: Take 100 mg by mouth 2 (two) times daily. ) 180 tablet 2  . Multiple Vitamins-Minerals (AIRBORNE PO) Take 1 tablet by mouth daily as needed (immune support).     . Multiple Vitamins-Minerals (CENTRUM SILVER ULTRA WOMENS) TABS Take 1 tablet by mouth every evening.     . potassium chloride SA (K-DUR) 20 MEQ tablet Take 20 mEq by mouth 2 (two) times daily.    . sodium chloride (OCEAN) 0.65 % nasal spray Place 2 sprays into the nose daily. After shower    . Turmeric 500 MG CAPS Take 500 mg by mouth daily.    Alveda Reasons 15 MG TABS tablet TAKE 1 TABLET BY MOUTH EVERY DAY WITH SUPPER 90 tablet 1   No current facility-administered medications for this visit.     Allergies as of 10/17/2018 - Review Complete 10/17/2018  Allergen Reaction Noted  . Augmentin [amoxicillin-pot clavulanate] Nausea And Vomiting and Other (See Comments) 10/01/2014    Family History  Problem Relation Age of Onset  .  Heart disease Mother   . Heart disease Father   . Hypertension Brother   . Diabetes Sister   . Colon cancer Neg Hx   . Pancreatic cancer Neg Hx   . Rectal cancer Neg Hx   . Stomach cancer Neg Hx     Social History   Socioeconomic History  . Marital status: Widowed    Spouse name: Not on file  . Number of children: Not on file  . Years of education: Not on file  . Highest education level: Not on file  Occupational History  . Not on file  Social Needs  . Financial resource strain: Not on file  . Food insecurity    Worry: Not on file    Inability: Not on file  . Transportation needs    Medical: Not on file    Non-medical: Not on file  Tobacco Use  . Smoking status: Never Smoker  . Smokeless tobacco: Never Used  Substance and Sexual Activity  . Alcohol use: Yes    Comment: rarely  . Drug use: No  . Sexual activity: Not on file  Lifestyle  . Physical activity    Days per week: Not on file    Minutes per session: Not on file  . Stress: Not on file  Relationships  . Social Herbalist on phone: Not on file    Gets together: Not on file    Attends religious service: Not on file    Active member of club or organization: Not on file    Attends meetings of clubs or organizations: Not on file    Relationship status: Not on file  . Intimate partner violence    Fear of current or ex partner: Not on file    Emotionally abused: Not on file    Physically abused: Not on file    Forced sexual activity: Not on file  Other Topics Concern  . Not on file  Social History Narrative   Widow, 2 sons.   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   No  tobacco.  Rare alcohol.   No drugs.  Exercise: 4 times per week, about 25m.    Review of Systems:    Constitutional: No weight loss, fever or chills Skin: No rash  Cardiovascular: No chest pain Respiratory: No SOB  Gastrointestinal: See HPI and otherwise negative Genitourinary: No dysuria  Neurological: No headache, dizziness or  syncope Musculoskeletal: No new muscle or joint pain Hematologic: No bleeding  Psychiatric: No history of depression or anxiety   Physical Exam:  Vital signs: BP (!) 158/80   Pulse 68   Temp 98.7 F (37.1 C)   Ht 5' 1"  (1.549 m)   Wt 164 lb (74.4 kg)   LMP  (LMP Unknown)   BMI 30.99 kg/m   Constitutional:   [place] Elderly Caucasian female appears to be in NAD, Well developed, Well nourished, alert and cooperative Head:  Normocephalic and atraumatic. Eyes:   PEERL, EOMI. No icterus. Conjunctiva pink. Ears:  Normal auditory acuity. Neck:  Supple Throat: Oral cavity and pharynx without inflammation, swelling or lesion.  Respiratory: Respirations even and unlabored. Lungs clear to auscultation bilaterally.   No wheezes, crackles, or rhonchi.  Cardiovascular: Normal S1, S2. No MRG. Regular rate and rhythm. No peripheral edema, cyanosis or pallor.  Gastrointestinal:  Soft, nondistended, nontender. No rebound or guarding. Normal bowel sounds. No appreciable masses or hepatomegaly. Rectal:  Not performed.  Msk:  Symmetrical without gross deformities. Without edema, no deformity or joint abnormality.  Neurologic:  Alert and  oriented x4;  grossly normal neurologically.  Skin:   Dry and intact without significant lesions or rashes. Psychiatric: Demonstrates good judgement and reason without abnormal affect or behaviors.  RELEVANT LABS AND IMAGING: CBC    Component Value Date/Time   WBC 6.4 09/21/2018 0930   RBC 4.53 09/21/2018 0930   HGB 13.6 09/21/2018 0930   HCT 44.6 09/21/2018 0930   PLT 175 09/21/2018 0930   MCV 98.5 09/21/2018 0930   MCV 89.3 06/11/2014 1313   MCH 30.0 09/21/2018 0930   MCHC 30.5 09/21/2018 0930   RDW 14.0 09/21/2018 0930   LYMPHSABS 1.0 09/21/2018 0930   MONOABS 0.5 09/21/2018 0930   EOSABS 0.2 09/21/2018 0930   BASOSABS 0.0 09/21/2018 0930    CMP     Component Value Date/Time   NA 141 09/21/2018 0930   K 4.6 09/21/2018 0930   CL 105 09/21/2018  0930   CO2 28 09/21/2018 0930   GLUCOSE 99 09/21/2018 0930   BUN 34 (H) 09/21/2018 0930   CREATININE 1.23 (H) 09/21/2018 0930   CREATININE 1.23 (H) 10/23/2017 0938   CALCIUM 9.4 09/21/2018 0930   PROT 7.6 09/21/2018 0930   PROT 6.9 10/12/2017 1538   ALBUMIN 4.0 09/21/2018 0930   ALBUMIN 3.9 10/12/2017 1538   AST 32 09/21/2018 0930   ALT 39 09/21/2018 0930   ALKPHOS 83 09/21/2018 0930   BILITOT 0.9 09/21/2018 0930   BILITOT 0.4 10/12/2017 1538   GFRNONAA 41 (L) 09/21/2018 0930   GFRAA 48 (L) 09/21/2018 0930   Assessment: 1.  History of adenomatous polyps: Last colonoscopy 2015 with recommendations to repeat in 3 years if medically fit 2.  Chronic anticoagulation: With Xarelto for A. fib  Plan: 1.  Discussed pros and cons of having colonoscopy with the patient.  She believes that she is doing well for her age" I do not want to die of colon cancer".  She would like to proceed with colonoscopy. 2.  [place]  Did discuss risks, benefits, limitations and alternatives the patient agrees to proceed.  This was arranged for late November to give the patient a couple of months since time of her back surgery. 3.  Patient was advised to hold her Xarelto for 2 days prior time procedure.  We will contact Dr. Debara Pickett to ensure this is acceptable for her. 4.  Patient tells me she has a follow-up appointment with Dr. Debara Pickett in November and will also talk to him about colonoscopy to make sure he has no reservations.  Told her to call and let us know if she changes her mind for any reason. 5.  Patient to follow in clinic with Dr. Henrene Pastor per his recommendations after procedure.  Ellouise Newer, PA-C [facility] 10/17/2018, 1:57 PM  Cc: McGowen, Adrian Blackwater, MD

## 2018-10-18 ENCOUNTER — Other Ambulatory Visit: Payer: Self-pay | Admitting: Physician Assistant

## 2018-10-18 ENCOUNTER — Telehealth: Payer: Self-pay | Admitting: Internal Medicine

## 2018-10-18 ENCOUNTER — Telehealth: Payer: Self-pay

## 2018-10-18 MED ORDER — NA SULFATE-K SULFATE-MG SULF 17.5-3.13-1.6 GM/177ML PO SOLN: 1.0000 | Freq: Once | ORAL | 0 refills | Status: AC

## 2018-10-18 NOTE — Telephone Encounter (Signed)
LMTCB for recommendations concerning clearance. No DPR on file for [facility].

## 2018-10-18 NOTE — Telephone Encounter (Signed)
Follow Up:      Returning your call from today. 

## 2018-10-18 NOTE — Progress Notes (Signed)
Assessment and tentative plans reviewed

## 2018-10-18 NOTE — Telephone Encounter (Signed)
Pt returned call. Recommendations given to pt to hold Xarelto for 2 days prior to Colonoscopy scheduled on 12/18/18.

## 2018-10-18 NOTE — Telephone Encounter (Signed)
Dr [facility] office has answered back and approved patient to hold her xarelto for 2 days prior to her procedure. I have left her a message to call me back.

## 2018-10-18 NOTE — Telephone Encounter (Signed)
   Primary Cardiologist: [name], MD  Chart reviewed as part of pre-operative protocol coverage. Request received for guidance on holding anticoagulation, not medical clearance.   Per our clinical pharmacist: Patient with diagnosis of ATRIAL FIBRILLATION on [place] for anticoagulation.    Procedure: COLOSOSCOPY Date of procedure: 12-18-2018  CHADS2-VASc score of  5 (CHF, HTN, AGE X2 ,female) *NO HX OF VTE/STROKE/TIA NOTED*  CrCl = 42ML/MIN Platelet count = 175  Per office protocol, patient can hold  for 2 days prior to procedure.     I will route this recommendation to the requesting party via Epic fax function and remove from pre-op pool.  Please call with questions.  [place], PA 10/18/2018, 2:35 PM

## 2018-10-18 NOTE — Telephone Encounter (Signed)
Patient with diagnosis of ATRIAL FIBRILLATION on [place] for anticoagulation.    Procedure: COLOSOSCOPY Date of procedure: 12-18-2018  CHADS2-VASc score of  5 (CHF, HTN, AGE X2 ,female) *NO HX OF VTE/STROKE/TIA NOTED*  CrCl = 42ML/MIN Platelet count = 175  Per office protocol, patient can hold  for 2 days prior to procedure.

## 2018-10-22 ENCOUNTER — Other Ambulatory Visit: Payer: Self-pay

## 2018-10-22 ENCOUNTER — Encounter: Payer: Self-pay | Admitting: Family Medicine

## 2018-10-22 ENCOUNTER — Ambulatory Visit (INDEPENDENT_AMBULATORY_CARE_PROVIDER_SITE_OTHER): Payer: Medicare Other | Admitting: Family Medicine

## 2018-10-22 VITALS — BP 162/79 | HR 62 | Temp 97.2°F | Resp 16 | Ht 61.0 in | Wt 174.2 lb

## 2018-10-22 DIAGNOSIS — Z79899 Other long term (current) drug therapy: Secondary | ICD-10-CM

## 2018-10-22 DIAGNOSIS — I10 Essential (primary) hypertension: Secondary | ICD-10-CM

## 2018-10-22 DIAGNOSIS — E782 Mixed hyperlipidemia: Secondary | ICD-10-CM | POA: Diagnosis not present

## 2018-10-22 DIAGNOSIS — I480 Paroxysmal atrial fibrillation: Secondary | ICD-10-CM | POA: Diagnosis not present

## 2018-10-22 DIAGNOSIS — R635 Abnormal weight gain: Secondary | ICD-10-CM

## 2018-10-22 DIAGNOSIS — N183 Chronic kidney disease, stage 3 unspecified: Secondary | ICD-10-CM

## 2018-10-22 MED ORDER — HYDRALAZINE HCL 10 MG PO TABS: 30.0000 mg | ORAL_TABLET | Freq: Three times a day (TID) | ORAL | 1 refills | Status: DC

## 2018-10-22 NOTE — Patient Instructions (Addendum)
Increase your hydralazine to THREE of the 56m tabs three times per day.  Take TWO of your 426mfurosemide tabs daily for the next 2 days.  Monitor blood pressure and heart rate once a day and write these numbers down and bring them in with you to review with me at next office visit in 2 weeks.

## 2018-10-22 NOTE — Progress Notes (Signed)
OFFICE VISIT  10/22/2018   CC:  Chief Complaint  Patient presents with  . Discuss lab results   HPI:    Patient is a 83 y.o. Caucasian female who presents for discussion of labs, she has lots of questions/anxieties about them. Her primary medical problems are CRI III (baseline GFR around 40 ml/min), GAD, chronic diastolic HF, PAF, HTN, and HLD.  She had lumbar surgery 09/21/18 and feels much improved.  She ran out of bp med last night, has not taken it today. Home bp monitoring: usually in the 140s, she pays no attention to diastolics but thinks it has been <80. She doesn't recall HR's. She has gone up 10 lbs in the last 5 d (different scales) and thinks she may have a bit more LE swelling but not much.  Admits she has been eating liberally.   ROS: no fever, no CP, no SOB, no wheezing, no cough, no dizziness, no HAs, no rashes, no melena/hematochezia.  No polyuria or polydipsia.  No myalgias or arthralgias.   Past Medical History:  Diagnosis Date  . Abnormal EKG approx 2008   Nuclear stress test neg;   . Anxiety    with panic  . Arthritis   . CAP (community acquired pneumonia) 08/2017   Hospitalization for CAP/acute diast HF/rapid a-fib  . Cataract    s/p surgery--lens implants  . Chronic diastolic heart failure ([place]) 2019  . Chronic renal insufficiency, stage 3 (moderate) 12/04/2012   Renal u/s when in hosp 08/2017 for CAP/CHF showed symmetric kidneys, echogenicity normal, w/out hydronephrosis. Baseline GFR around 40 ml/min as of 10/2018.  . DDD (degenerative disc disease), lumbar   . Diverticulosis 2009  . Fracture of radial shaft, left, closed 11/16/10   fell down flight of stairs  . History of kidney stones   . Hx of adenomatous colonic polyps 2002   surveillance colonoscopy 2009, +polypectomy done-tubular adenoma w/out high grade.  05/2013 tubular adenomas--recall 3 yrs  . Hyperlipidemia   . Hypertension   . Low TSH level 02/18/2016   T3 norm, T4 mildly  elevated--suspected sick euthyroid syndrome.  Repeat labs 06/2016: normal.  . Melanoma in situ ([place]) 06/2018   L LL  . Osteopenia    DEXA 08/2010; repeat DEXA 02/2015 worse: fosamax started.  06/2018 Dexa T score -2.4.  2020 maj osteop fx risk = 24%, Hip fx risk 7.3%. Rpt 2 yrs.  Marland Kitchen PAF (paroxysmal atrial fibrillation) ([place]) 02/2016   when in post-op for ankle surgery; spontaneously converted in hosp, seen by Dr. Debara Pickett in consultation--metoprolol rate control + xarelto recommended.  Metop d/c due to hypot.  Plan to cont xarelto 20 mg qd indef due to CHAD-VASc score of 3.  A-fib w/RVR and CHF 07/2017; pt placed on amiodarone and plan for CV, but pt was in sinus rhythm when she went in for her DC CV, so she was sent home.  . Peripheral edema   . Pneumonia 2015   hx with sepsis  . Rheumatic fever     Past Surgical History:  Procedure Laterality Date  . APPENDECTOMY  1966   done during surgery for tubal pregnancy  . CATARACT EXTRACTION W/ INTRAOCULAR LENS IMPLANT  2013   bilat  . COLONOSCOPY W/ POLYPECTOMY  05/2013   +diverticulosis; recall 3 yrs (Dr. Henrene Pastor)  . DEXA  02/2015; 06/2018   T score -2.1 in both femoral necks; FRAX 10 yr risk of major osteoporotic fracture was 21%---fosamax started. 06/2018 T score -2.4.  repeat 1 yr.  Marland Kitchen  EYE SURGERY    . LEFT HEART CATH AND CORONARY ANGIOGRAPHY N/A 09/01/2017   No angiographically significant CAD.  Upper normal left ventricular filling pressure.  Procedure: LEFT HEART CATH AND CORONARY ANGIOGRAPHY;  Surgeon: Nelva Bush, MD;  Location: [facility];  Service: Cardiovascular;  Laterality: N/A;  . LUMBAR LAMINECTOMY/DECOMPRESSION MICRODISCECTOMY N/A 09/26/2018   Procedure: Decompressive Lumbar Laminectomy L5 S1 FORAMINOTOMY L5 S1  NERVE ROOT BILATERALLY and Microdiscectomy L5-S1 Left;  Surgeon: Latanya Maudlin, MD;  Location: [facility];  Service: Orthopedics;  Laterality: N/A;  146mn  . OPEN REDUCTION INTERNAL FIXATION (ORIF) TIBIA/FIBULA FRACTURE Left  02/18/2016   Procedure: OPEN REDUCTION INTERNAL FIXATION (ORIF) Right ankle trimalleolar fracture;  Surgeon: JWylene Simmer MD;  [place]  Service: Orthopedics;  Laterality: Left;  requests 926ms  . ORIF RADIAL FRACTURE  11/18/10   left; s/p slip on slippery floor and fell  . THORACENTESIS  08/2017   diagnostic and therapeutic.  Transudative.  Clx neg.  (+pulm edema/diastolic HF)  . TONSILLECTOMY    . TRANSTHORACIC ECHOCARDIOGRAM  02/18/2016; 08/10/17   LVEF of 55-60%, mild AI and mild MR and normal biatrial size.  07/2017--normal LV function, mild enlarge aortic root, mild/mod TR, bilat atrial enlargement.    Outpatient Medications Prior to Visit  Medication Sig Dispense Refill  . albuterol (PROVENTIL HFA;VENTOLIN HFA) 108 (90 Base) MCG/ACT inhaler Inhale 2 puffs into the lungs every 4 (four) hours as needed for wheezing or shortness of breath. 8.5 g 1  . ALPRAZolam (XANAX) 0.5 MG tablet TAKE 1 TABLET BY MOUTH THREE TIMES DAILY IF NEEDED FOR STRESS (Patient taking differently: Take 0.5 mg by mouth 3 (three) times daily as needed (stress). ) 90 tablet 5  . amiodarone (PACERONE) 100 MG tablet Take 1 tablet (100 mg total) by mouth daily.    . Marland Kitchenpoaequorin (PREVAGEN PO) Take 1 tablet by mouth daily.    . Marland Kitchentorvastatin (LIPITOR) 40 MG tablet TAKE 1 TABLET BY MOUTH EVERY DAY (Patient taking differently: Take 40 mg by mouth every evening. ) 90 tablet 1  . Biotin 5000 MCG TABS Take 5,000 mcg by mouth daily.    . Calcium Carbonate (CALCIUM 600 PO) Take 1,200 mg by mouth daily.     . Coenzyme Q10 ([employer] COQ-10) 200 MG capsule Take 200 mg by mouth daily.    . Marland KitchenRANBERRY PO Take 2 tablets by mouth daily.    . Marland Kitchenocusate sodium (COLACE) 100 MG capsule Take 100 mg by mouth daily as needed (constipation.).     . Marland Kitchenluticasone (FLONASE) 50 MCG/ACT nasal spray Place 2 sprays into both nostrils daily.     . furosemide (LASIX) 40 MG tablet TAKE 1 TABLET BY MOUTH EVERY DAY 90 tablet 1  . metoprolol tartrate  (LOPRESSOR) 100 MG tablet TAKE 1 TABLET BY MOUTH 2 TIMES DAILY (Patient taking differently: Take 100 mg by mouth 2 (two) times daily. ) 180 tablet 2  . Multiple Vitamins-Minerals (AIRBORNE PO) Take 1 tablet by mouth daily as needed (immune support).     . Multiple Vitamins-Minerals (CENTRUM SILVER ULTRA WOMENS) TABS Take 1 tablet by mouth every evening.     . potassium chloride SA (K-DUR) 20 MEQ tablet Take 20 mEq by mouth 2 (two) times daily.    . sodium chloride (OCEAN) 0.65 % nasal spray Place 2 sprays into the nose daily. After shower    . Turmeric 500 MG CAPS Take 500 mg by mouth daily.    . Alveda Reasons5 MG TABS tablet  TAKE 1 TABLET BY MOUTH EVERY DAY WITH SUPPER 90 tablet 1  . fluticasone (FLONASE) 50 MCG/ACT nasal spray Place into both nostrils daily.    . hydrALAZINE (APRESOLINE) 10 MG tablet Take 2 tablets (20 mg total) by mouth 3 (three) times daily. 540 tablet 3  . carboxymethylcellulose (REFRESH PLUS) 0.5 % SOLN Place 2 drops into both eyes daily as needed (dry/irritated eyes.).     Marland Kitchen HYDROcodone-acetaminophen (NORCO/VICODIN) 5-325 MG tablet Take 1 tablet by mouth every 6 (six) hours as needed for moderate pain ((score 4 to 6)). (Patient not taking: Reported on 10/22/2018) 30 tablet 0  . Menthol, Topical Analgesic, (ICY HOT EX) Apply 1 application topically every 4 (four) hours as needed (calf pain).     No facility-administered medications prior to visit.     Allergies  Allergen Reactions  . Augmentin [Amoxicillin-Pot Clavulanate] Nausea And Vomiting and Other (See Comments)    "projectile vomiting" Has patient had a PCN reaction causing immediate rash, facial/tongue/throat swelling, SOB or lightheadedness with hypotension:No Has patient had a PCN reaction causing severe rash involving mucus membranes or skin necrosis:No Has patient had a PCN reaction that required hospitalization:No Has patient had a PCN reaction occurring within the last 10 years:Yes If all of the above answers  are "NO", then may proceed with Cephalosporin use.     ROS As per HPI  PE: Blood pressure (!) 162/79, pulse 62, temperature (!) 97.2 F (36.2 C), temperature source Temporal, resp. rate 16, height 5' 1"  (1.549 m), weight 174 lb 3.2 oz (79 kg), SpO2 94 %. Body mass index is 32.91 kg/m.  Exam chaperoned by CMA Marlene Bast Gen: Alert, well appearing.  Patient is oriented to person, place, time, and situation. AFFECT: pleasant, lucid thought and speech. CV: RRR, no m/r/g.   LUNGS: CTA bilat, nonlabored resps, good aeration in all lung fields. ABD: soft, NT/ND EXT: has compression hose on, can only get a bit of pitting to show up with pressure.   LABS:  Lab Results  Component Value Date   TSH 1.570 10/12/2017   Lab Results  Component Value Date   WBC 6.4 09/21/2018   HGB 13.6 09/21/2018   HCT 44.6 09/21/2018   MCV 98.5 09/21/2018   PLT 175 09/21/2018   Lab Results  Component Value Date   CREATININE 1.23 (H) 09/21/2018   BUN 34 (H) 09/21/2018   NA 141 09/21/2018   K 4.6 09/21/2018   CL 105 09/21/2018   CO2 28 09/21/2018   Lab Results  Component Value Date   ALT 39 09/21/2018   AST 32 09/21/2018   ALKPHOS 83 09/21/2018   BILITOT 0.9 09/21/2018   Lab Results  Component Value Date   CHOL 192 09/10/2018   Lab Results  Component Value Date   HDL 73.10 09/10/2018   Lab Results  Component Value Date   LDLCALC 50 07/27/2017   Lab Results  Component Value Date   TRIG 220.0 (H) 09/10/2018   Lab Results  Component Value Date   CHOLHDL 3 09/10/2018   No results found for: HGBA1C  IMPRESSION AND PLAN:  1) HTN, control not ideal.  Goal <130/80 consistently.  Instructions: Increase your hydralazine to THREE of the 38m tabs three times per day.  She will resume her lopressor today (ran out last night). Monitor blood pressure and heart rate once a day and write these numbers down and bring them in with you to review with me at next office visit in 2  weeks.  2) Mild volume overload/wt gain: likely got IVF in hosp for her surgery. Part of her wt gain is caloric, she admits. Take TWO of your 39m furosemide tabs daily for the next 2 days.  3) CRI III: stable.  Reassured pt.  Avoids NSAIDs.    4) PAF: doing well on amiodarone, lopressor, and xarelto 173mqd. Since she is on amiodarone I'll monitor TSH today.  5) Mixed hyperlipidemia: LDL at goal 2 mo ago.  Trigs a little up.  Encouraged tighter diet and increased exercise.  Tolerating statin.  Spent 30 min with pt today, with >50% of this time spent in counseling and care coordination regarding the above problems.  An After Visit Summary was printed and given to the patient.  FOLLOW UP: Return in about 2 weeks (around 11/05/2018) for f/u HTN and wt.  Signed:  PhCrissie SicklesMD           10/22/2018

## 2018-10-22 NOTE — Telephone Encounter (Signed)
Patient informed about holding the xarelto for 2 days and she verbalized understanding.

## 2018-10-23 LAB — TSH: TSH: 0.68 u[IU]/mL (ref 0.35–4.50)

## 2018-11-06 ENCOUNTER — Ambulatory Visit: Payer: Medicare Other | Admitting: Sports Medicine

## 2018-11-07 ENCOUNTER — Encounter: Payer: Self-pay | Admitting: Family Medicine

## 2018-11-07 ENCOUNTER — Ambulatory Visit (INDEPENDENT_AMBULATORY_CARE_PROVIDER_SITE_OTHER): Payer: Medicare Other | Admitting: Family Medicine

## 2018-11-07 ENCOUNTER — Other Ambulatory Visit: Payer: Self-pay

## 2018-11-07 VITALS — BP 133/73 | HR 56 | Temp 97.9°F | Resp 16 | Ht 61.0 in | Wt 170.2 lb

## 2018-11-07 DIAGNOSIS — I10 Essential (primary) hypertension: Secondary | ICD-10-CM

## 2018-11-07 NOTE — Progress Notes (Signed)
OFFICE VISIT  11/07/2018   CC:  Chief Complaint  Patient presents with  . Follow-up    hypertension, weight   HPI:    Patient is a 83 y.o. Caucasian female who presents for 2 wk f/u HTN. A/P as of last visit: "HTN, control not ideal.  Goal <130/80 consistently.  Instructions: Increase your hydralazine to THREE of the 20m tabs three times per day.  She will resume her lopressor today (ran out last night). Monitor blood pressure and heart rate once a day and write these numbers down and bring them in with you to review with me at next office visit in 2 weeks."  HTN: has been taking meds as rx'd.  She is making efforts at wt loss, has come down 4 lbs since last visit[facility] some in 140s, a couple in 150 range.   Diastolics 695A-21H HR "very good" but no numbers per pt recall. No change in the way she feels overall.  As per her usual she sometimes feels a bit unsure regarding her balance--has back pain, has f/u with Dr. GGladstone Lighternext week.  ROS: no CP, no SOB, no wheezing, no cough, no dizziness, no HAs, no rashes, no melena/hematochezia.  No polyuria or polydipsia.  No myalgias or arthralgias.   Past Medical History:  Diagnosis Date  . Abnormal EKG approx 2008   Nuclear stress test neg;   . Anxiety    with panic  . Arthritis   . CAP (community acquired pneumonia) 08/2017   Hospitalization for CAP/acute diast HF/rapid a-fib  . Cataract    s/p surgery--lens implants  . [place]  . Chronic renal insufficiency, stage 3 (moderate) 12/04/2012   Renal u/s when in hosp 08/2017 for CAP/CHF showed symmetric kidneys, echogenicity normal, w/out hydronephrosis. Baseline GFR around 40 ml/min as of 10/2018.  . DDD (degenerative disc disease), lumbar   . Diverticulosis 2009  . Fracture of radial shaft, left, closed 11/16/10   fell down flight of stairs  . History of kidney stones   . Hx of adenomatous colonic polyps 2002   surveillance colonoscopy 2009, +polypectomy done-tubular adenoma w/out high grade.  05/2013 tubular adenomas--recall 3 yrs  . Hyperlipidemia   . Hypertension   . Low TSH level 02/18/2016   T3 norm, T4 mildly elevated--suspected sick euthyroid syndrome.  Repeat labs 06/2016: normal.  . [place]   L LL  . Osteopenia    DEXA 08/2010; repeat DEXA 02/2015 worse: fosamax started.  06/2018 Dexa T score -2.4.  2020 maj osteop fx risk = 24%, Hip fx risk 7.3%. Rpt 2 yrs.  .[place]   when in post-op for ankle surgery; spontaneously converted in hosp, seen by Dr. HDebara Pickettin consultation--metoprolol rate control + xarelto recommended.  Metop d/c due to hypot.  Plan to cont xarelto 20 mg qd indef due to CHAD-VASc score of 3.  A-fib w/RVR and CHF 07/2017; pt placed on amiodarone and plan for CV, but pt was in sinus rhythm when she went in for her DC CV, so she was sent home.  . Peripheral edema   . Pneumonia 2015   hx with sepsis  . Rheumatic fever     Past Surgical History:  Procedure Laterality Date  . APPENDECTOMY  1966   done during surgery for tubal pregnancy  . CATARACT EXTRACTION W/ INTRAOCULAR LENS IMPLANT  2013   bilat  . COLONOSCOPY  W/ POLYPECTOMY  05/2013   +diverticulosis; recall 3 yrs (Dr. Henrene Pastor)  . DEXA  02/2015; 06/2018   T score -2.1 in both femoral necks; FRAX 10 yr risk of major osteoporotic fracture was 21%---fosamax started. 06/2018 T score -2.4.  repeat 1 yr.  Marland Kitchen EYE SURGERY    . LEFT HEART CATH AND CORONARY ANGIOGRAPHY N/A 09/01/2017   No angiographically significant CAD.  Upper normal left ventricular filling pressure.  Procedure: LEFT HEART CATH AND CORONARY ANGIOGRAPHY;  Surgeon: Nelva Bush, MD;  Location: [facility];  Service: Cardiovascular;  Laterality: N/A;  . LUMBAR LAMINECTOMY/DECOMPRESSION MICRODISCECTOMY N/A 09/26/2018   Procedure: Decompressive Lumbar Laminectomy L5 S1 FORAMINOTOMY L5 S1  NERVE ROOT  BILATERALLY and Microdiscectomy L5-S1 Left;  Surgeon: Latanya Maudlin, MD;  Location: [facility];  Service: Orthopedics;  Laterality: N/A;  119mn  . OPEN REDUCTION INTERNAL FIXATION (ORIF) TIBIA/FIBULA FRACTURE Left 02/18/2016   Procedure: OPEN REDUCTION INTERNAL FIXATION (ORIF) Right ankle trimalleolar fracture;  Surgeon: JWylene Simmer MD;  [place] [facility]  Service: Orthopedics;  Laterality: Left;  requests 963ms  . ORIF RADIAL FRACTURE  11/18/10   left; s/p slip on slippery floor and fell  . THORACENTESIS  08/2017   diagnostic and therapeutic.  Transudative.  Clx neg.  (+pulm edema/diastolic HF)  . TONSILLECTOMY    . TRANSTHORACIC ECHOCARDIOGRAM  02/18/2016; 08/10/17   LVEF of 55-60%, mild AI and mild MR and normal biatrial size.  07/2017--normal LV function, mild enlarge aortic root, mild/mod TR, bilat atrial enlargement.    Outpatient Medications Prior to Visit  Medication Sig Dispense Refill  . albuterol (PROVENTIL HFA;VENTOLIN HFA) 108 (90 Base) MCG/ACT inhaler Inhale 2 puffs into the lungs every 4 (four) hours as needed for wheezing or shortness of breath. 8.5 g 1  . ALPRAZolam (XANAX) 0.5 MG tablet TAKE 1 TABLET BY MOUTH THREE TIMES DAILY IF NEEDED FOR STRESS (Patient taking differently: Take 0.5 mg by mouth 3 (three) times daily as needed (stress). ) 90 tablet 5  . amiodarone (PACERONE) 100 MG tablet Take 1 tablet (100 mg total) by mouth daily. (Patient taking differently: Take 100 mg by mouth daily. Pt takes 20058m   . Apoaequorin (PREVAGEN PO) Take 1 tablet by mouth daily.    . aMarland Kitchenorvastatin (LIPITOR) 40 MG tablet TAKE 1 TABLET BY MOUTH EVERY DAY (Patient taking differently: Take 40 mg by mouth every evening. ) 90 tablet 1  . Biotin 5000 MCG TABS Take 5,000 mcg by mouth daily.    . Calcium Carbonate (CALCIUM 600 PO) Take 1,200 mg by mouth daily.     . carboxymethylcellulose (REFRESH PLUS) 0.5 % SOLN Place 2 drops into both eyes daily as needed (dry/irritated eyes.).     . CMarland Kitchenenzyme Q10 ([employer]S  COQ-10) 200 MG capsule Take 200 mg by mouth daily.    . CMarland KitchenANBERRY PO Take 2 tablets by mouth daily.    . dMarland Kitchencusate sodium (COLACE) 100 MG capsule Take 100 mg by mouth daily as needed (constipation.).     . fMarland Kitchenuticasone (FLONASE) 50 MCG/ACT nasal spray Place 2 sprays into both nostrils daily.     . furosemide (LASIX) 40 MG tablet TAKE 1 TABLET BY MOUTH EVERY DAY 90 tablet 1  . hydrALAZINE (APRESOLINE) 10 MG tablet Take 3 tablets (30 mg total) by mouth 3 (three) times daily. 90 tablet 1  . metoprolol tartrate (LOPRESSOR) 100 MG tablet TAKE 1 TABLET BY MOUTH 2 TIMES DAILY (Patient taking differently: Take 100 mg by mouth 2 (two) times  daily. ) 180 tablet 2  . Multiple Vitamins-Minerals (CENTRUM SILVER ULTRA WOMENS) TABS Take 1 tablet by mouth every evening.     . potassium chloride SA (K-DUR) 20 MEQ tablet Take 20 mEq by mouth 2 (two) times daily.    . sodium chloride (OCEAN) 0.65 % nasal spray Place 2 sprays into the nose daily. After shower    . Turmeric 500 MG CAPS Take 500 mg by mouth daily.    Alveda Reasons 15 MG TABS tablet TAKE 1 TABLET BY MOUTH EVERY DAY WITH SUPPER 90 tablet 1  . Multiple Vitamins-Minerals (AIRBORNE PO) Take 1 tablet by mouth daily as needed (immune support).     Marland Kitchen HYDROcodone-acetaminophen (NORCO/VICODIN) 5-325 MG tablet Take 1 tablet by mouth every 6 (six) hours as needed for moderate pain ((score 4 to 6)). (Patient not taking: Reported on 10/22/2018) 30 tablet 0   No facility-administered medications prior to visit.     Allergies  Allergen Reactions  . Augmentin [Amoxicillin-Pot Clavulanate] Nausea And Vomiting and Other (See Comments)    "projectile vomiting" Has patient had a PCN reaction causing immediate rash, facial/tongue/throat swelling, SOB or lightheadedness with hypotension:No Has patient had a PCN reaction causing severe rash involving mucus membranes or skin necrosis:No Has patient had a PCN reaction that required hospitalization:No Has patient had a PCN  reaction occurring within the last 10 years:Yes If all of the above answers are "NO", then may proceed with Cephalosporin use.     ROS As per HPI  PE: Blood pressure 133/73, pulse (!) 56, temperature 97.9 F (36.6 C), temperature source Temporal, resp. rate 16, height 5' 1"  (1.549 m), weight 170 lb 3.2 oz (77.2 kg), SpO2 94 %. Gen: Alert, well appearing.  Patient is oriented to person, place, time, and situation. AFFECT: pleasant, lucid thought and speech. CV: RRR, no m/r/g.   LUNGS: CTA bilat, nonlabored resps, good aeration in all lung fields. EXT: no clubbing or cyanosis.  no edema.    LABS:    Chemistry      Component Value Date/Time   NA 141 09/21/2018 0930   K 4.6 09/21/2018 0930   CL 105 09/21/2018 0930   CO2 28 09/21/2018 0930   BUN 34 (H) 09/21/2018 0930   CREATININE 1.23 (H) 09/21/2018 0930   CREATININE 1.23 (H) 10/23/2017 0938      Component Value Date/Time   CALCIUM 9.4 09/21/2018 0930   ALKPHOS 83 09/21/2018 0930   AST 32 09/21/2018 0930   ALT 39 09/21/2018 0930   BILITOT 0.9 09/21/2018 0930   BILITOT 0.4 10/12/2017 1538      IMPRESSION AND PLAN:  1) HTN: control is fine, goal for her is 135-140/80s.  I feel like going for tighter control with her possibly bring on adverse symptoms for her.  Discussed today and pt in agreement. No labs today. Reviewed meds with pt today, answered questions.  Spent 15 min with pt today, with >50% of this time spent in counseling and care coordination regarding the above problems.  An After Visit Summary was printed and given to the patient.  FOLLOW UP: Return in about 3 months (around 02/07/2019) for annual CPE (fasting).  Signed:  Crissie Sickles, MD           11/07/2018

## 2018-11-07 NOTE — Patient Instructions (Signed)
Call or make appt for return visit IF your blood pressure is consistently >145 on top or greater than 90 on bottom.

## 2018-11-19 ENCOUNTER — Encounter: Payer: Self-pay | Admitting: Family Medicine

## 2018-11-27 ENCOUNTER — Other Ambulatory Visit: Payer: Self-pay | Admitting: Family Medicine

## 2018-11-27 NOTE — Telephone Encounter (Signed)
Requesting: alprazolam Contract: 01/03/18 UDS:n/a  Last Visit: 11/07/18 Next Visit: advised to f/u 3 mo. Last Refill: 05/16/18 (90,5)  Please Advise. Medication pending

## 2018-11-30 ENCOUNTER — Ambulatory Visit: Payer: Medicare Other | Admitting: Internal Medicine

## 2018-11-30 ENCOUNTER — Encounter: Payer: Self-pay | Admitting: Internal Medicine

## 2018-11-30 ENCOUNTER — Other Ambulatory Visit: Payer: Self-pay

## 2018-11-30 VITALS — BP 171/76 | HR 56 | Temp 97.2°F | Ht 61.0 in | Wt 171.0 lb

## 2018-11-30 DIAGNOSIS — I10 Essential (primary) hypertension: Secondary | ICD-10-CM

## 2018-11-30 DIAGNOSIS — I480 Paroxysmal atrial fibrillation: Secondary | ICD-10-CM | POA: Diagnosis not present

## 2018-11-30 DIAGNOSIS — E782 Mixed hyperlipidemia: Secondary | ICD-10-CM | POA: Diagnosis not present

## 2018-11-30 DIAGNOSIS — Z79899 Other long term (current) drug therapy: Secondary | ICD-10-CM | POA: Diagnosis not present

## 2018-11-30 MED ORDER — AMIODARONE HCL 200 MG PO TABS: 100.0000 mg | ORAL_TABLET | Freq: Every day | ORAL | 3 refills | Status: DC

## 2018-11-30 NOTE — Progress Notes (Signed)
OFFICE NOTE  Chief Complaint:  Routine follow-up  Primary Care Physician: [name], MD  HPI:  Brenda Matthews is a 83 y.o. 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 After induction with anesthesia today, she was noted to go into a-fib with RVR which persists. She has no awareness of this. Risk factors include age, HTN and dyslipidemia. Mom had several strokes, but no known a-fib. Exam benign except irregularly irregular rhythm, post-operative ankle. Apparently had a stress test in 2008 which was negative. She was placed on Xarelto and a low-dose beta blocker. Unfortunately blood pressure was low and she was taken off of her medication. She spontaneously converted while in the hospital and I encouraged her to stay on the [place]. She returns today for follow-up. She reports that she is very anxious to get out of her cast. Hopefully soon she will be in a walking boot. She denies any bleeding problems on the Xarelto. She does not report any recurrent atrial fibrillation.  12/16/2016  Brenda Matthews returns today for follow-up.  She denies any recurrent atrial fibrillation.  Since I last saw her she is done fairly well.  Blood pressure is normal today 124/66.  EKG shows sinus rhythm at 67, personally reviewed.  She remains anticoagulated for paroxysmal atrial fibrillation and chads vas score of 3.  She was started on anticoagulation while hospitalized by the orthopedist and was switched from the orthopedic dose of Xarelto to 15 mg daily.  I reviewed lab work, specifically, her renal function which is normal, and advised her that she should be on the full 20 mg dose of Xarelto.  10/12/2017  Brenda Matthews is seen today in follow-up.  Overall she is doing well.  She denies any recurrent A. fib.  Recently she saw her PCP was noted to have an elevated creatinine up to 1.7 from 1.1.  He decreased her Lasix  from 80 to 40 mg daily.  She had previously been on 20 mg of Xarelto however given her acute kidney injury and GFR less than 35 the dose was decreased to 15 mg.  She is on amiodarone 400 mg daily.  11/30/2018  Brenda Matthews returns today for follow-up and preoperative cardiovascular clearance.  She is scheduled to have a colonoscopy on December 1 and will need to hold her Xarelto 3 days prior to that.  I do not have any problem with it.  Her blood pressure was elevated today.  She had some confusion about amiodarone.  Per the pharmacy the bottle says she is supposed to take 400 mg twice daily however we had cut the dose back to 160m daily on her last office visit.  She says she is actually only taking 1 tablet daily which would be 200 mg daily.  Her EKG shows she is maintaining sinus bradycardia 56 with first-degree AV block.  PMHx:  Past Medical History:  Diagnosis Date  . Abnormal EKG approx 2008   Nuclear stress test neg;   . Anxiety    with panic  . Arthritis   . CAP (community acquired pneumonia) 08/2017   Hospitalization for CAP/acute diast HF/rapid a-fib  . Cataract    s/p surgery--lens implants  . [place] heart [place]  . Chronic renal insufficiency, stage 3 (moderate) 12/04/2012   Renal u/s when in hosp 08/2017 for CAP/CHF showed symmetric kidneys, echogenicity normal, w/out hydronephrosis. Baseline GFR around 40 ml/min  as of 10/2018.  . DDD (degenerative disc disease), lumbar   . Diverticulosis 2009  . Fracture of radial shaft, left, closed 11/16/10   fell down flight of stairs  . History of kidney stones   . Hx of adenomatous colonic polyps 2002;2009;2015   surveillance colonoscopy 2009, +polypectomy done-tubular adenoma w/out high grade.  05/2013 tubular adenomas--recall 3 yrs  . Hyperlipidemia   . Hypertension   . Low TSH level 02/18/2016   T3 norm, T4 mildly elevated--suspected sick euthyroid syndrome.  Repeat labs 06/2016: normal.  . Melanoma in situ ([place])  06/2018   L LL  . Osteopenia    DEXA 08/2010; repeat DEXA 02/2015 worse: fosamax started.  06/2018 Dexa T score -2.4.  2020 maj osteop fx risk = 24%, Hip fx risk 7.3%. Rpt 2 yrs.  Brenda Matthews PAF (paroxysmal atrial fibrillation) ([place]) 02/2016   when in post-op for ankle surgery; spontaneously converted in hosp, seen by Dr. Debara Pickett in consultation--metoprolol rate control + xarelto recommended.  Metop d/c due to hypot.  Plan to cont xarelto 20 mg qd indef due to CHAD-VASc score of 3.  A-fib w/RVR and CHF 07/2017; pt placed on amiodarone and plan for CV, but pt was in sinus rhythm when she went in for her DC CV, so she was sent home.  . Peripheral edema   . Pneumonia 2015   hx with sepsis  . Rheumatic fever     Past Surgical History:  Procedure Laterality Date  . APPENDECTOMY  1966   done during surgery for tubal pregnancy  . CATARACT EXTRACTION W/ INTRAOCULAR LENS IMPLANT  2013   bilat  . COLONOSCOPY W/ POLYPECTOMY  05/2013   +diverticulosis; recall 3 yrs (Dr. Henrene Pastor)  . DEXA  02/2015; 06/2018   T score -2.1 in both femoral necks; FRAX 10 yr risk of major osteoporotic fracture was 21%---fosamax started. 06/2018 T score -2.4.  repeat 1 yr.  Brenda Matthews EYE SURGERY    . LEFT HEART CATH AND CORONARY ANGIOGRAPHY N/A 09/01/2017   No angiographically significant CAD.  Upper normal left ventricular filling pressure.  Procedure: LEFT HEART CATH AND CORONARY ANGIOGRAPHY;  Surgeon: Nelva Bush, MD;  Location: [facility];  Service: Cardiovascular;  Laterality: N/A;  . LUMBAR LAMINECTOMY/DECOMPRESSION MICRODISCECTOMY N/A 09/26/2018   Procedure: Decompressive Lumbar Laminectomy L5 S1 FORAMINOTOMY L5 S1  NERVE ROOT BILATERALLY and Microdiscectomy L5-S1 Left;  Surgeon: Latanya Maudlin, MD;  Location: [facility];  Service: Orthopedics;  Laterality: N/A;  161mn  . OPEN REDUCTION INTERNAL FIXATION (ORIF) TIBIA/FIBULA FRACTURE Left 02/18/2016   Procedure: OPEN REDUCTION INTERNAL FIXATION (ORIF) Right ankle trimalleolar fracture;   Surgeon: JWylene Simmer MD;  [facility] [facility]  Service: Orthopedics;  Laterality: Left;  requests [id number]  . ORIF RADIAL FRACTURE  11/18/10   left; s/p slip on slippery floor and fell  . THORACENTESIS  08/2017   diagnostic and therapeutic.  Transudative.  Clx neg.  (+pulm edema/diastolic HF)  . TONSILLECTOMY    . TRANSTHORACIC ECHOCARDIOGRAM  02/18/2016; 08/10/17   LVEF of 55-60%, mild AI and mild MR and normal biatrial size.  07/2017--normal LV function, mild enlarge aortic root, mild/mod TR, bilat atrial enlargement.    FAMHx:  Family History  Problem Relation Age of Onset  . Heart disease Mother   . Heart disease Father   . Hypertension Brother   . Diabetes Sister   . Colon cancer Neg Hx   . Pancreatic cancer Neg Hx   . Rectal cancer Neg Hx   .  Stomach cancer Neg Hx     SOCHx:   reports that she has never smoked. She has never used smokeless tobacco. She reports current alcohol use. She reports that she does not use drugs.  ALLERGIES:  Allergies  Allergen Reactions  . Augmentin [Amoxicillin-Pot Clavulanate] Nausea And Vomiting and Other (See Comments)    "projectile vomiting" Has patient had a PCN reaction causing immediate rash, facial/tongue/throat swelling, SOB or lightheadedness with hypotension:No Has patient had a PCN reaction causing severe rash involving mucus membranes or skin necrosis:No Has patient had a PCN reaction that required hospitalization:No Has patient had a PCN reaction occurring within the last 10 years:Yes If all of the above answers are "NO", then may proceed with Cephalosporin use.     ROS: Pertinent items noted in HPI and remainder of comprehensive ROS otherwise negative.  HOME MEDS: Current Outpatient Medications on File Prior to Visit  Medication Sig Dispense Refill  . albuterol (PROVENTIL HFA;VENTOLIN HFA) 108 (90 Base) MCG/ACT inhaler Inhale 2 puffs into the lungs every 4 (four) hours as needed for wheezing or shortness of breath. 8.5 g 1  .  ALPRAZolam (XANAX) 0.5 MG tablet TAKE 1 TABLET BY MOUTH 3 TIMES DAILY AS NEEDED FOR STRESS 90 tablet 5  . Apoaequorin (PREVAGEN PO) Take 1 tablet by mouth daily.    Brenda Matthews atorvastatin (LIPITOR) 40 MG tablet TAKE 1 TABLET BY MOUTH EVERY DAY (Patient taking differently: Take 40 mg by mouth every evening. ) 90 tablet 1  . Biotin 5000 MCG TABS Take 5,000 mcg by mouth daily.    . Calcium Carbonate (CALCIUM 600 PO) Take 1,200 mg by mouth daily.     . carboxymethylcellulose (REFRESH PLUS) 0.5 % SOLN Place 2 drops into both eyes daily as needed (dry/irritated eyes.).     Brenda Matthews Coenzyme Q10 ([employer] COQ-10) 200 MG capsule Take 200 mg by mouth daily.    Brenda Matthews CRANBERRY PO Take 2 tablets by mouth daily.    Brenda Matthews docusate sodium (COLACE) 100 MG capsule Take 100 mg by mouth daily as needed (constipation.).     Brenda Matthews fluticasone (FLONASE) 50 MCG/ACT nasal spray Place 2 sprays into both nostrils daily.     . furosemide (LASIX) 40 MG tablet TAKE 1 TABLET BY MOUTH EVERY DAY 90 tablet 1  . hydrALAZINE (APRESOLINE) 10 MG tablet Take 3 tablets (30 mg total) by mouth 3 (three) times daily. 90 tablet 1  . metoprolol tartrate (LOPRESSOR) 100 MG tablet TAKE 1 TABLET BY MOUTH 2 TIMES DAILY (Patient taking differently: Take 100 mg by mouth 2 (two) times daily. ) 180 tablet 2  . Multiple Vitamins-Minerals (AIRBORNE PO) Take 1 tablet by mouth daily as needed (immune support).     . Multiple Vitamins-Minerals (CENTRUM SILVER ULTRA WOMENS) TABS Take 1 tablet by mouth every evening.     . potassium chloride SA (K-DUR) 20 MEQ tablet Take 20 mEq by mouth 2 (two) times daily.    . sodium chloride (OCEAN) 0.65 % nasal spray Place 2 sprays into the nose daily. After shower    . Turmeric 500 MG CAPS Take 500 mg by mouth daily.    Alveda Reasons 15 MG TABS tablet TAKE 1 TABLET BY MOUTH EVERY DAY WITH SUPPER 90 tablet 1   No current facility-administered medications on file prior to visit.     LABS/IMAGING: No results found for this or any previous visit  (from the past 48 hour(s)). No results found.  WEIGHTS: Wt Readings from Last 3 Encounters:  11/30/18  171 lb (77.6 kg)  11/07/18 170 lb 3.2 oz (77.2 kg)  10/22/18 174 lb 3.2 oz (79 kg)    VITALS: BP (!) 171/76   Pulse (!) 56   Temp (!) 97.2 F (36.2 C)   Ht 5' 1"  (1.549 m)   Wt 171 lb (77.6 kg)   LMP  (LMP Unknown)   SpO2 95%   BMI 32.31 kg/m   EXAM: General appearance: alert and no distress Neck: no carotid bruit and no JVD Lungs: clear to auscultation bilaterally Heart: regular rate and rhythm Abdomen: soft, non-tender; bowel sounds normal; no masses,  no organomegaly Extremities: Left lower leg in a cast Pulses: 2+ and symmetric Skin: Skin color, texture, turgor normal. No rashes or lesions Neurologic: Grossly normal Psych: Pleasant  EKG: Sinus bradycardia first-degree AV block at 56-personally reviewed  ASSESSMENT: 1. Paroxysmal atrial fibrillation-CHADSVASC score of 3 2. History of NSTEMI with mild, non-obstructive CAD by cath (08/2017) 3. Hypertension-uncontrolled 4. Family history of stroke  PLAN: 1.   Ms. Delehanty continues to maintain sinus rhythm.  I would further reduce her amiodarone to 100 mg daily but will need to make sure that is changed in the system as her bottle does not read that.  Currently she could cut her tablets in half and take 1/2 tablet once a day.  She denies any recurrent chest pain.  Blood pressure is elevated today we will need to monitor that closely.  Follow-up with me in 6 months.  [name], MD, [facility], [place] Director of the Advanced Lipid Disorders &  Cardiovascular Risk Reduction Clinic Attending Cardiologist  Direct Dial: [phone]  Fax: [phone]  Website:  [email or web address] Bhat 11/30/2018, 5:06 PM

## 2018-11-30 NOTE — Patient Instructions (Signed)
Medication Instructions:  TAKE amiodarone 118m daily (half of a 2022mtablet) *If you need a refill on your cardiac medications before your next appointment, please call your pharmacy*  Follow-Up: [facility] are our [facility].  As part of our continuing mission to provide you with exceptional heart care, we have created designated Provider Care Teams.  These Care Teams include your primary Cardiologist (physician) and Advanced Practice Providers (APPs -  Physician Assistants and Nurse Practitioners) who all work together to provide you with the care you need, when you need it.  Your next appointment:   6 months  The format for your next appointment:   In Person  Provider:   You may see KePixie CasinoMD or one of the following Advanced Practice Providers on your designated Care Team:    HaAlmyra DeforestPA-C  AnFabian SharpPA-C or   KrRoby LoftsPAVermont Other Instructions HOLD xarelto 3 days prior to procedure

## 2018-12-05 ENCOUNTER — Encounter: Payer: Self-pay | Admitting: Internal Medicine

## 2018-12-10 ENCOUNTER — Other Ambulatory Visit: Payer: Self-pay | Admitting: Family Medicine

## 2018-12-12 ENCOUNTER — Other Ambulatory Visit: Payer: Self-pay

## 2018-12-12 ENCOUNTER — Emergency Department (HOSPITAL_BASED_OUTPATIENT_CLINIC_OR_DEPARTMENT_OTHER)
Admission: EM | Admit: 2018-12-12 | Discharge: 2018-12-12 | Disposition: A | Discharge status: Home / Self Care | Payer: Medicare Other | Attending: Emergency Medicine | Admitting: Emergency Medicine

## 2018-12-12 ENCOUNTER — Encounter (HOSPITAL_BASED_OUTPATIENT_CLINIC_OR_DEPARTMENT_OTHER): Payer: Self-pay

## 2018-12-12 ENCOUNTER — Other Ambulatory Visit: Payer: Self-pay | Admitting: Family Medicine

## 2018-12-12 DIAGNOSIS — Z7901 Long term (current) use of anticoagulants: Secondary | ICD-10-CM | POA: Diagnosis not present

## 2018-12-12 DIAGNOSIS — W260XXA Contact with knife, initial encounter: Secondary | ICD-10-CM | POA: Diagnosis not present

## 2018-12-12 DIAGNOSIS — Y9389 Activity, other specified: Secondary | ICD-10-CM | POA: Diagnosis not present

## 2018-12-12 DIAGNOSIS — I480 Paroxysmal atrial fibrillation: Secondary | ICD-10-CM | POA: Diagnosis not present

## 2018-12-12 DIAGNOSIS — N182 Chronic kidney disease, stage 2 (mild): Secondary | ICD-10-CM | POA: Diagnosis not present

## 2018-12-12 DIAGNOSIS — S61211A Laceration without foreign body of left index finger without damage to nail, initial encounter: Secondary | ICD-10-CM | POA: Diagnosis not present

## 2018-12-12 DIAGNOSIS — Y92010 Kitchen of single-family (private) house as the place of occurrence of the external cause: Secondary | ICD-10-CM | POA: Insufficient documentation

## 2018-12-12 DIAGNOSIS — Z23 Encounter for immunization: Secondary | ICD-10-CM | POA: Diagnosis not present

## 2018-12-12 DIAGNOSIS — Y999 Unspecified external cause status: Secondary | ICD-10-CM | POA: Insufficient documentation

## 2018-12-12 DIAGNOSIS — Z79899 Other long term (current) drug therapy: Secondary | ICD-10-CM | POA: Diagnosis not present

## 2018-12-12 DIAGNOSIS — I5032 Chronic diastolic (congestive) heart failure: Secondary | ICD-10-CM | POA: Diagnosis not present

## 2018-12-12 DIAGNOSIS — I130 Hypertensive heart and chronic kidney disease with heart failure and stage 1 through stage 4 chronic kidney disease, or unspecified chronic kidney disease: Secondary | ICD-10-CM | POA: Diagnosis not present

## 2018-12-12 LAB — CBC WITH DIFFERENTIAL/PLATELET
Abs Immature Granulocytes: 0.01 10*3/uL (ref 0.00–0.07)
Basophils Absolute: 0 10*3/uL (ref 0.0–0.1)
Basophils Relative: 0 %
Eosinophils Absolute: 0.1 10*3/uL (ref 0.0–0.5)
Eosinophils Relative: 1 %
HCT: 42.8 % (ref 36.0–46.0)
Hemoglobin: 13.2 g/dL (ref 12.0–15.0)
Immature Granulocytes: 0 %
Lymphocytes Relative: 20 %
Lymphs Abs: 1.5 10*3/uL (ref 0.7–4.0)
MCH: 29.4 pg (ref 26.0–34.0)
MCHC: 30.8 g/dL (ref 30.0–36.0)
MCV: 95.3 fL (ref 80.0–100.0)
Monocytes Absolute: 0.6 10*3/uL (ref 0.1–1.0)
Monocytes Relative: 8 %
Neutro Abs: 5.1 10*3/uL (ref 1.7–7.7)
Neutrophils Relative %: 71 %
Platelets: 192 10*3/uL (ref 150–400)
RBC: 4.49 MIL/uL (ref 3.87–5.11)
RDW: 14.2 % (ref 11.5–15.5)
WBC: 7.2 10*3/uL (ref 4.0–10.5)
nRBC: 0 % (ref 0.0–0.2)

## 2018-12-12 MED ORDER — TETANUS-DIPHTH-ACELL PERTUSSIS 5-2.5-18.5 LF-MCG/0.5 IM SUSP
0.5000 mL | Freq: Once | INTRAMUSCULAR | Status: AC
  Administered 2018-12-12: 0.5 mL via INTRAMUSCULAR
  Filled 2018-12-12: qty 0.5

## 2018-12-12 MED ORDER — LIDOCAINE HCL 1 % IJ SOLN
INTRAMUSCULAR | Status: AC
  Administered 2018-12-12: 20 mL
  Filled 2018-12-12: qty 20

## 2018-12-12 MED ORDER — CEPHALEXIN 500 MG PO CAPS: 500.0000 mg | ORAL_CAPSULE | Freq: Four times a day (QID) | ORAL | 0 refills | Status: AC

## 2018-12-12 NOTE — ED Notes (Signed)
Pt states she takes xarelto- has been bleeding since 1700. Suture set up at bedside

## 2018-12-12 NOTE — ED Triage Notes (Addendum)
Pt cut left index finger ~5pm with kitchen knife-pt bleeding through bulky gauze dsg placed PTA- horizontal lac noted to mid finger that continues to bleed bulky dsg applied-NAD-steady gait

## 2018-12-12 NOTE — Discharge Instructions (Addendum)
You were given a prescription for antibiotics. Please take the antibiotic prescription fully.   Please follow-up for suture removal at either urgent care, the emergency department, or your primary care doctor in 10-14 days.  Please return to the emergency room immediately if you experience any new or worsening symptoms or any symptoms that indicate worsening infection such as fevers, increased redness/swelling/pain, warmth, or drainage from the affected area.

## 2018-12-12 NOTE — ED Provider Notes (Signed)
[facility] EMERGENCY DEPARTMENT Provider Note   CSN: [id number] Arrival date & time: 12/12/18  1925     History   Chief Complaint Chief Complaint  Patient presents with  . Finger Injury    HPI Brenda Matthews is a 83 y.o. female.     HPI  Patient is an 83 year old female with a history of CHF, renal insufficiency, diverticulosis, hyperlipidemia, hypertension, osteopenia, A. fib who presents the emergency department today for evaluation of a laceration.  Patient states that she was using a knife to try to open a jar when the knife slipped and cut her on the left index finger.  This occurred around 5 PM and she states she has been bleeding ever since.  She denies any sensory changes or problems with moving her finger.  She is on Xarelto.  Past Medical History:  Diagnosis Date  . Abnormal EKG approx 2008   Nuclear stress test neg;   . Anxiety    with panic  . Arthritis   . CAP (community acquired pneumonia) 08/2017   Hospitalization for CAP/acute diast HF/rapid a-fib  . Cataract    s/p surgery--lens implants  . Chronic diastolic heart failure ([place]) 2019  . Chronic renal insufficiency, stage 3 (moderate) 12/04/2012   Renal u/s when in hosp 08/2017 for CAP/CHF showed symmetric kidneys, echogenicity normal, w/out hydronephrosis. Baseline GFR around 40 ml/min as of 10/2018.  . DDD (degenerative disc disease), lumbar   . Diverticulosis 2009  . Fracture of radial shaft, left, closed 11/16/10   fell down flight of stairs  . History of kidney stones   . Hx of adenomatous colonic polyps 2002;2009;2015   surveillance colonoscopy 2009, +polypectomy done-tubular adenoma w/out high grade.  05/2013 tubular adenomas--recall 3 yrs  . Hyperlipidemia   . Hypertension   . Low TSH level 02/18/2016   T3 norm, T4 mildly elevated--suspected sick euthyroid syndrome.  Repeat labs 06/2016: normal.  . Melanoma in situ ([place]) 06/2018   L LL  . Osteopenia    DEXA 08/2010; repeat DEXA 02/2015  worse: fosamax started.  06/2018 Dexa T score -2.4.  2020 maj osteop fx risk = 24%, Hip fx risk 7.3%. Rpt 2 yrs.  Marland Kitchen PAF (paroxysmal atrial fibrillation) ([place]) 02/2016   when in post-op for ankle surgery; spontaneously converted in hosp, seen by Dr. Debara Pickett in consultation--metoprolol rate control + xarelto recommended.  Metop d/c due to hypot.  Plan to cont xarelto 20 mg qd indef due to CHAD-VASc score of 3.  A-fib w/RVR and CHF 07/2017; pt placed on amiodarone and plan for CV, but pt was in sinus rhythm when she went in for her DC CV, so she was sent home.  . Peripheral edema   . Pneumonia 2015   hx with sepsis  . Rheumatic fever     Patient Active Problem List   Diagnosis Date Noted  . Spinal stenosis, lumbar region with neurogenic claudication 09/26/2018  . Persistent atrial fibrillation 10/12/2017  . Acute on chronic diastolic heart failure ([place])   . S/P thoracentesis   . Acute respiratory failure ([place])   . Non-ST elevation (NSTEMI) myocardial infarction ([place])   . Sepsis ([place]) 08/30/2017  . Acute-on-chronic kidney injury ([place]) 08/30/2017  . Rash in adult 08/30/2017  . Atrial fibrillation and flutter ([place])   . Chest pain 08/29/2017  . DOE (dyspnea on exertion) 08/29/2017  . Arterial hypotension   . Chronic anticoagulation 12/19/2016  . AKI (acute kidney injury) ([place])   . Low TSH  level 02/19/2016  . PAF (paroxysmal atrial fibrillation) ([place]) 02/18/2016  . Ankle fracture, left-s/p surgery 02/18/16 02/18/2016  . Closed displaced trimalleolar fracture of left ankle 02/18/2016  . Acute bronchitis 02/10/2015  . Bronchopneumonia 12/02/2013  . Chronic renal insufficiency, stage II (mild) 12/04/2012  . Hx of adenomatous colonic polyps   . Routine general medical examination at a health care facility 09/05/2011  . Cervical cancer screening 02/24/2011  . SCIATICA, BILATERAL 09/01/2009  . SACROILIAC STRAIN, ACUTE 09/01/2009  . ANEMIA 11/07/2007  . Osteopenia of the elderly 11/07/2007  .  INSOMNIA 11/07/2007  . EDEMA 08/14/2007  . Hyperlipidemia 04/04/2007  . PANIC DISORDER 04/04/2007  . Essential hypertension 04/04/2007  . Anxiety state 12/27/2006  . ARTHRITIS 12/27/2006    Past Surgical History:  Procedure Laterality Date  . APPENDECTOMY  1966   done during surgery for tubal pregnancy  . CATARACT EXTRACTION W/ INTRAOCULAR LENS IMPLANT  2013   bilat  . COLONOSCOPY W/ POLYPECTOMY  05/2013   +diverticulosis; recall 3 yrs (Dr. Henrene Pastor)  . DEXA  02/2015; 06/2018   T score -2.1 in both femoral necks; FRAX 10 yr risk of major osteoporotic fracture was 21%---fosamax started. 06/2018 T score -2.4.  repeat 1 yr.  Marland Kitchen EYE SURGERY    . LEFT HEART CATH AND CORONARY ANGIOGRAPHY N/A 09/01/2017   No angiographically significant CAD.  Upper normal left ventricular filling pressure.  Procedure: LEFT HEART CATH AND CORONARY ANGIOGRAPHY;  Surgeon: Nelva Bush, MD;  Location: [facility];  Service: Cardiovascular;  Laterality: N/A;  . LUMBAR LAMINECTOMY/DECOMPRESSION MICRODISCECTOMY N/A 09/26/2018   Procedure: Decompressive Lumbar Laminectomy L5 S1 FORAMINOTOMY L5 S1  NERVE ROOT BILATERALLY and Microdiscectomy L5-S1 Left;  Surgeon: Latanya Maudlin, MD;  Location: [facility];  Service: Orthopedics;  Laterality: N/A;  172mn  . OPEN REDUCTION INTERNAL FIXATION (ORIF) TIBIA/FIBULA FRACTURE Left 02/18/2016   Procedure: OPEN REDUCTION INTERNAL FIXATION (ORIF) Right ankle trimalleolar fracture;  Surgeon: JWylene Simmer MD;  [facility]  Service: Orthopedics;  Laterality: Left;  requests 973ms  . ORIF RADIAL FRACTURE  11/18/10   left; s/p slip on slippery floor and fell  . THORACENTESIS  08/2017   diagnostic and therapeutic.  Transudative.  Clx neg.  (+pulm edema/diastolic HF)  . TONSILLECTOMY    . TRANSTHORACIC ECHOCARDIOGRAM  02/18/2016; 08/10/17   LVEF of 55-60%, mild AI and mild MR and normal biatrial size.  07/2017--normal LV function, mild enlarge aortic root, mild/mod TR, bilat atrial  enlargement.     OB History   No obstetric history on file.      Home Medications    Prior to Admission medications   Medication Sig Start Date End Date Taking? Authorizing Provider  albuterol (VENTOLIN HFA) 108 (90 Base) MCG/ACT inhaler INHALE 2 PUFFS INTO THE LUNGS EVERY 4 HOURS AS NEEDED FOR WHEEZING OR SHORTNESS OF BREATH 12/12/18   McGowen, PhAdrian BlackwaterMD  ALPRAZolam (XDuanne Moron0.5 MG tablet TAKE 1 TABLET BY MOUTH 3 TIMES DAILY AS NEEDED FOR STRESS 11/27/18   McGowen, PhAdrian BlackwaterMD  amiodarone (PACERONE) 200 MG tablet Take 0.5 tablets (100 mg total) by mouth daily. 11/30/18   Hilty, KeNadean CorwinMD  Apoaequorin (PREVAGEN PO) Take 1 tablet by mouth daily.    [provider]  atorvastatin (LIPITOR) 40 MG tablet TAKE 1 TABLET BY MOUTH EVERY DAY Patient taking differently: Take 40 mg by mouth every evening.  09/18/18   McGowen, PhAdrian BlackwaterMD  Biotin 5000 MCG TABS Take 5,000 mcg by mouth  daily.    [provider]  Calcium Carbonate (CALCIUM 600 PO) Take 1,200 mg by mouth daily.     [provider]  carboxymethylcellulose (REFRESH PLUS) 0.5 % SOLN Place 2 drops into both eyes daily as needed (dry/irritated eyes.).     [provider]  cephALEXin (KEFLEX) 500 MG capsule Take 1 capsule (500 mg total) by mouth 4 (four) times daily for 7 days. 12/12/18 12/19/18  Luie Laneve S, PA-C  Coenzyme Q10 ([employer] COQ-10) 200 MG capsule Take 200 mg by mouth daily.    [provider]  CRANBERRY PO Take 2 tablets by mouth daily.    [provider]  docusate sodium (COLACE) 100 MG capsule Take 100 mg by mouth daily as needed (constipation.).     [provider]  fluticasone (FLONASE) 50 MCG/ACT nasal spray Place 2 sprays into both nostrils daily.     [provider]  furosemide (LASIX) 40 MG tablet TAKE 1 TABLET BY MOUTH EVERY DAY 09/28/18   McGowen, Adrian Blackwater, MD  hydrALAZINE (APRESOLINE) 10 MG tablet Take 3 tablets (30 mg total) by mouth 3 (three)  times daily. 10/22/18   McGowen, Adrian Blackwater, MD  metoprolol tartrate (LOPRESSOR) 100 MG tablet TAKE 1 TABLET BY MOUTH 2 TIMES DAILY Patient taking differently: Take 100 mg by mouth 2 (two) times daily.  08/03/18   Hilty, Nadean Corwin, MD  Multiple Vitamins-Minerals (AIRBORNE PO) Take 1 tablet by mouth daily as needed (immune support).     [provider]  Multiple Vitamins-Minerals (CENTRUM SILVER ULTRA WOMENS) TABS Take 1 tablet by mouth every evening.     [provider]  potassium chloride SA (K-DUR) 20 MEQ tablet Take 20 mEq by mouth 2 (two) times daily. 08/08/18   [provider]  sodium chloride (OCEAN) 0.65 % nasal spray Place 2 sprays into the nose daily. After shower    [provider]  Turmeric 500 MG CAPS Take 500 mg by mouth daily.    [provider]  XARELTO 15 MG TABS tablet TAKE 1 TABLET BY MOUTH EVERY DAY WITH SUPPER 09/26/18   Hilty, Nadean Corwin, MD    Family History Family History  Problem Relation Age of Onset  . Heart disease Mother   . Heart disease Father   . Hypertension Brother   . Diabetes Sister   . Colon cancer Neg Hx   . Pancreatic cancer Neg Hx   . Rectal cancer Neg Hx   . Stomach cancer Neg Hx     Social History Social History   Tobacco Use  . Smoking status: Never Smoker  . Smokeless tobacco: Never Used  Substance Use Topics  . Alcohol use: Yes    Comment: rarely  . Drug use: No     Allergies   Augmentin [amoxicillin-pot clavulanate]   Review of Systems Review of Systems  Skin: Positive for wound.  Neurological: Negative for numbness.     Physical Exam Updated Vital Signs BP (!) 167/77   Pulse 64   Temp 98.9 F (37.2 C) (Oral)   Resp 20   Ht 5' 1"  (1.549 m)   Wt 74.8 kg   LMP  (LMP Unknown)   SpO2 97%   BMI 31.18 kg/m   Physical Exam Vitals signs and nursing note reviewed.  Constitutional:      General: She is not in acute distress.    Appearance: She is well-developed.  HENT:     Head:  Normocephalic and atraumatic.  Eyes:  Conjunctiva/sclera: Conjunctivae normal.  Neck:     Musculoskeletal: Neck supple.  Cardiovascular:     Rate and Rhythm: Normal rate.  Pulmonary:     Effort: Pulmonary effort is normal.  Musculoskeletal: Normal range of motion.  Skin:    General: Skin is warm and dry.     Comments: 1 cm horizontal laceration to the palmar surface of the left index finger that is actively bleeding. Brisk cap refill distally. Normal sensation and ROM.   Neurological:     Mental Status: She is alert.      ED Treatments / Results  Labs (all labs ordered are listed, but only abnormal results are displayed) Labs Reviewed  CBC WITH DIFFERENTIAL/PLATELET    EKG None  Radiology No results found.  Procedures .Marland KitchenLaceration Repair  Date/Time: 12/12/2018 11:31 PM Performed by: Rodney Booze, PA-C Authorized by: Rodney Booze, PA-C   Consent:    Consent obtained:  Verbal   Consent given by:  Patient   Risks discussed:  Infection and pain   Alternatives discussed:  No treatment Anesthesia (see MAR for exact dosages):    Anesthesia method:  Nerve block   Block anesthetic:  Lidocaine 1% w/o epi   Block injection procedure:  Anatomic landmarks identified, introduced needle, incremental injection, negative aspiration for blood and anatomic landmarks palpated   Block outcome:  Anesthesia achieved Laceration details:    Location:  Finger   Finger location:  L index finger   Length (cm):  1 Repair type:    Repair type:  Simple Pre-procedure details:    Preparation:  Patient was prepped and draped in usual sterile fashion Exploration:    Hemostasis achieved with:  Direct pressure   Wound exploration: wound explored through full range of motion and entire depth of wound probed and visualized     Wound extent: vascular damage     Wound extent: no nerve damage noted and no tendon damage noted     Contaminated: no   Treatment:    Area cleansed with:   Saline   Amount of cleaning:  Standard   Irrigation solution:  Sterile saline   Irrigation method:  Pressure wash   Visualized foreign bodies/material removed: no   Skin repair:    Repair method:  Sutures   Suture size:  5-0   Suture material:  Prolene   Suture technique:  Simple interrupted   Number of sutures:  3 Approximation:    Approximation:  Close Post-procedure details:    Dressing:  Splint for protection and bulky dressing   Patient tolerance of procedure:  Tolerated well, no immediate complications   (including critical care time)  Medications Ordered in ED Medications  lidocaine (XYLOCAINE) 1 % (with pres) injection (20 mLs  Given by Other 12/12/18 2055)  Tdap (BOOSTRIX) injection 0.5 mL (0.5 mLs Intramuscular Given 12/12/18 2055)     Initial Impression / Assessment and Plan / ED Course  I have reviewed the triage vital signs and the nursing notes.  Pertinent labs & imaging results that were available during my care of the patient were reviewed by me and considered in my medical decision making (see chart for details).     Final Clinical Impressions(s) / ED Diagnoses   Final diagnoses:  Laceration of left index finger without foreign body without damage to nail, initial encounter   Pressure irrigation performed. Wound explored and base of wound visualized in a bloodless field without evidence of foreign body.  Laceration occurred < 8 hours  prior to repair which was well tolerated.  Tdap updated.  Pt has no comorbidities to effect normal wound healing. Pt discharged with antibiotics. Discussed suture home care with patient and answered questions. Pt to follow-up for wound check and suture removal in 10-14 days; they are to return to the ED sooner for signs of infection. Pt is hemodynamically stable with no complaints prior to dc.    ED Discharge Orders         Ordered    cephALEXin (KEFLEX) 500 MG capsule  4 times daily     12/12/18 2048           Bishop Dublin 12/12/18 2332    Virgel Manifold, MD 12/14/18 2032

## 2018-12-18 ENCOUNTER — Encounter: Payer: Medicare Other | Admitting: Internal Medicine

## 2018-12-31 ENCOUNTER — Other Ambulatory Visit: Payer: Self-pay | Admitting: Family Medicine

## 2019-01-02 ENCOUNTER — Other Ambulatory Visit: Payer: Self-pay | Admitting: Internal Medicine

## 2019-01-17 ENCOUNTER — Other Ambulatory Visit: Payer: Self-pay | Admitting: Internal Medicine

## 2019-01-25 IMAGING — DX DG CHEST 2V
2 series · 2 of 2 positions shown · non-contrast
Comparison: 08/04/2017 and 08/01/2017 and 02/19/2016

CLINICAL DATA: Chest pain and shortness of breath atrial
fibrillation.

EXAM:
CHEST - 2 VIEW

[x chest ap]
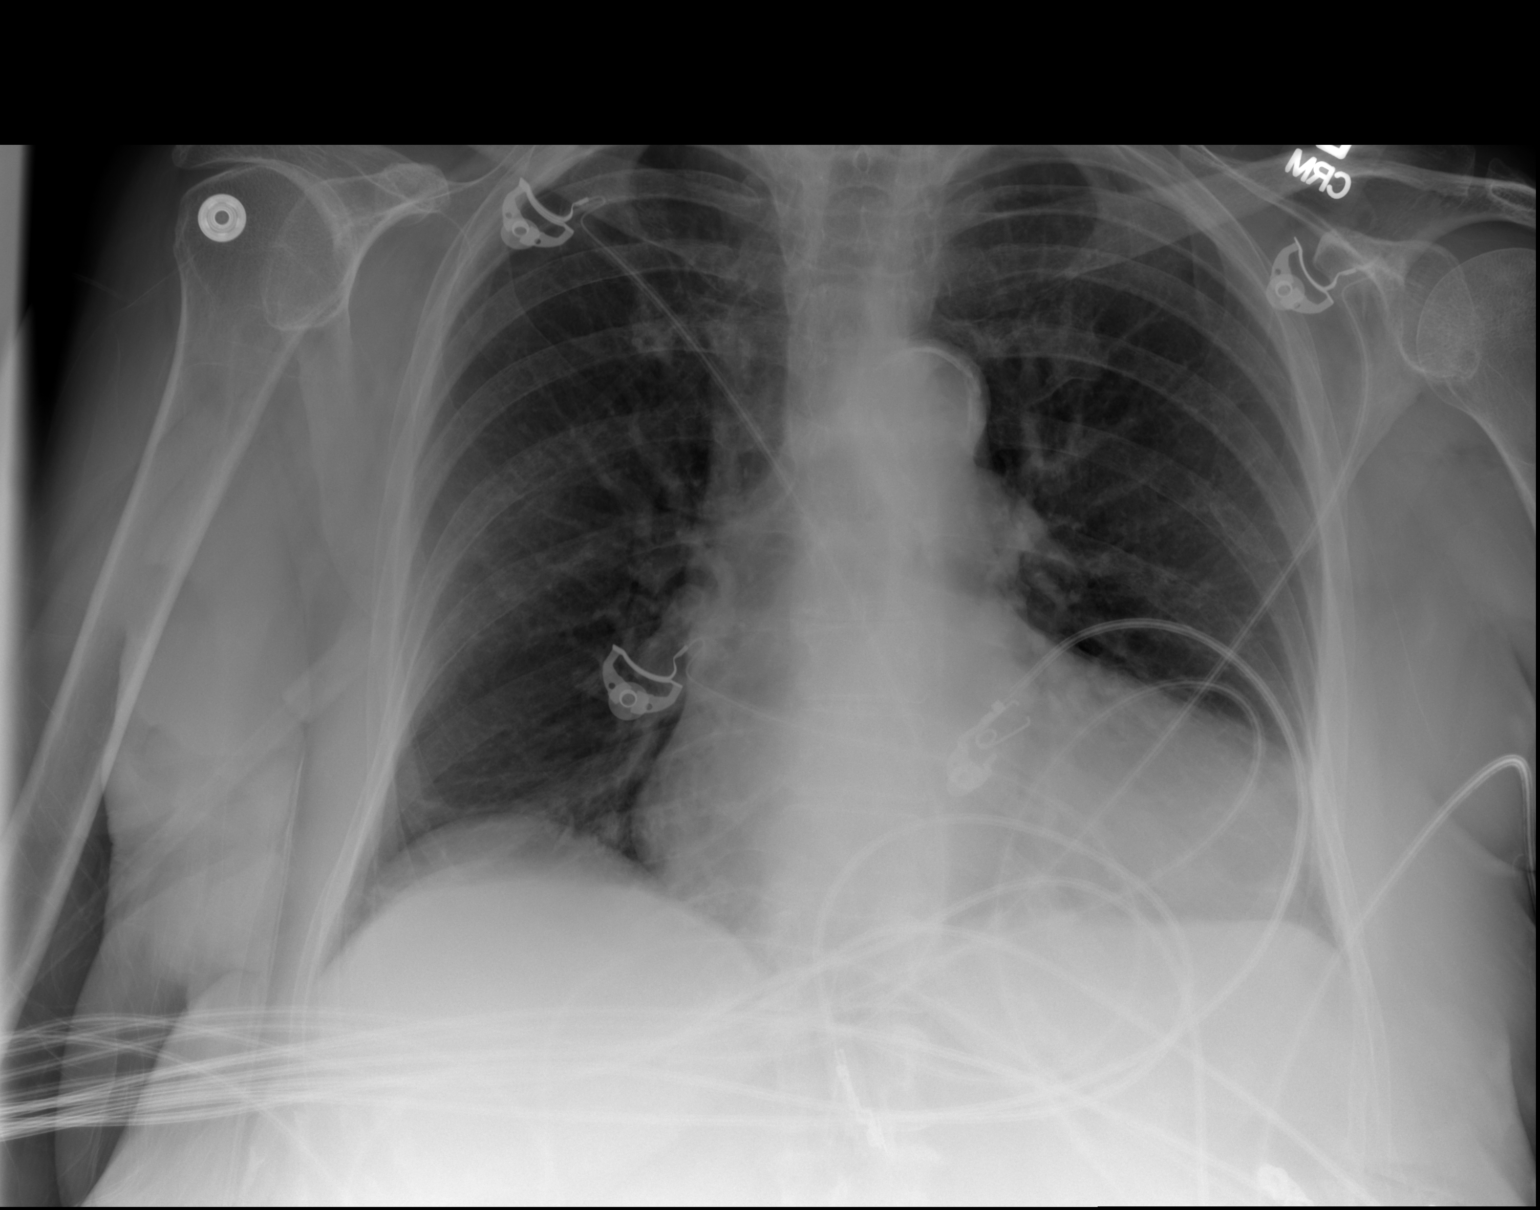

[w chest lat]
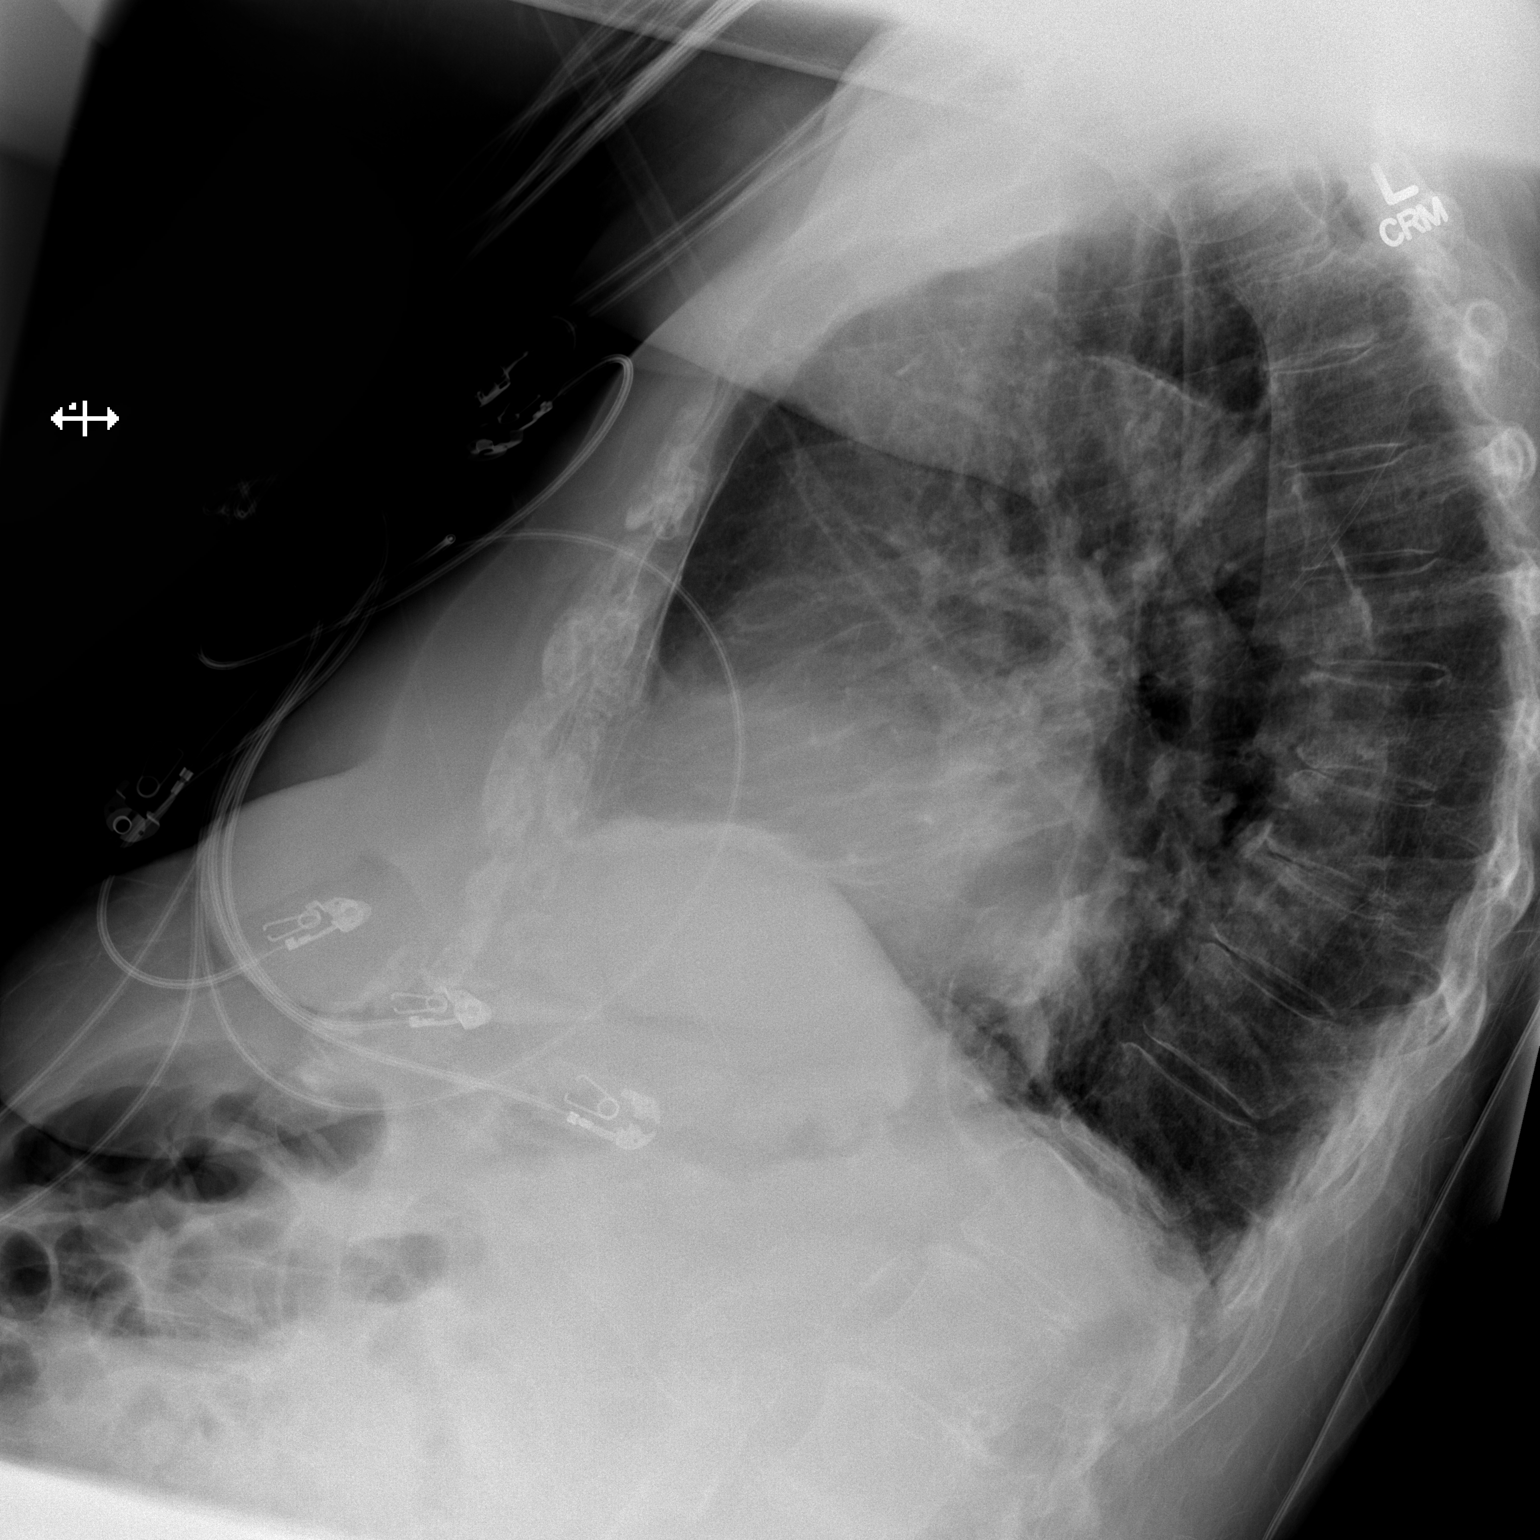

[2 of 2 positions shown; findings below may reference images not displayed]

FINDINGS: Chronic cardiomegaly.  Aortic atherosclerosis.

Pulmonary vascularity is normal. No infiltrates or effusions. Slight
chronic accentuation of the thoracic kyphosis.
IMPRESSION: Chronic cardiomegaly.  No acute abnormalities.

Aortic Atherosclerosis ([id number]).

## 2019-01-28 DIAGNOSIS — M170 Bilateral primary osteoarthritis of knee: Secondary | ICD-10-CM | POA: Diagnosis not present

## 2019-01-28 DIAGNOSIS — M1711 Unilateral primary osteoarthritis, right knee: Secondary | ICD-10-CM | POA: Diagnosis not present

## 2019-01-28 DIAGNOSIS — M1712 Unilateral primary osteoarthritis, left knee: Secondary | ICD-10-CM | POA: Diagnosis not present

## 2019-01-28 IMAGING — CR DG CHEST 2V
2 series · 2 of 2 positions shown · non-contrast
Comparison: 08/30/2017 chest radiograph.

CLINICAL DATA: Pneumonia

EXAM:
CHEST - 2 VIEW

[chest pa]
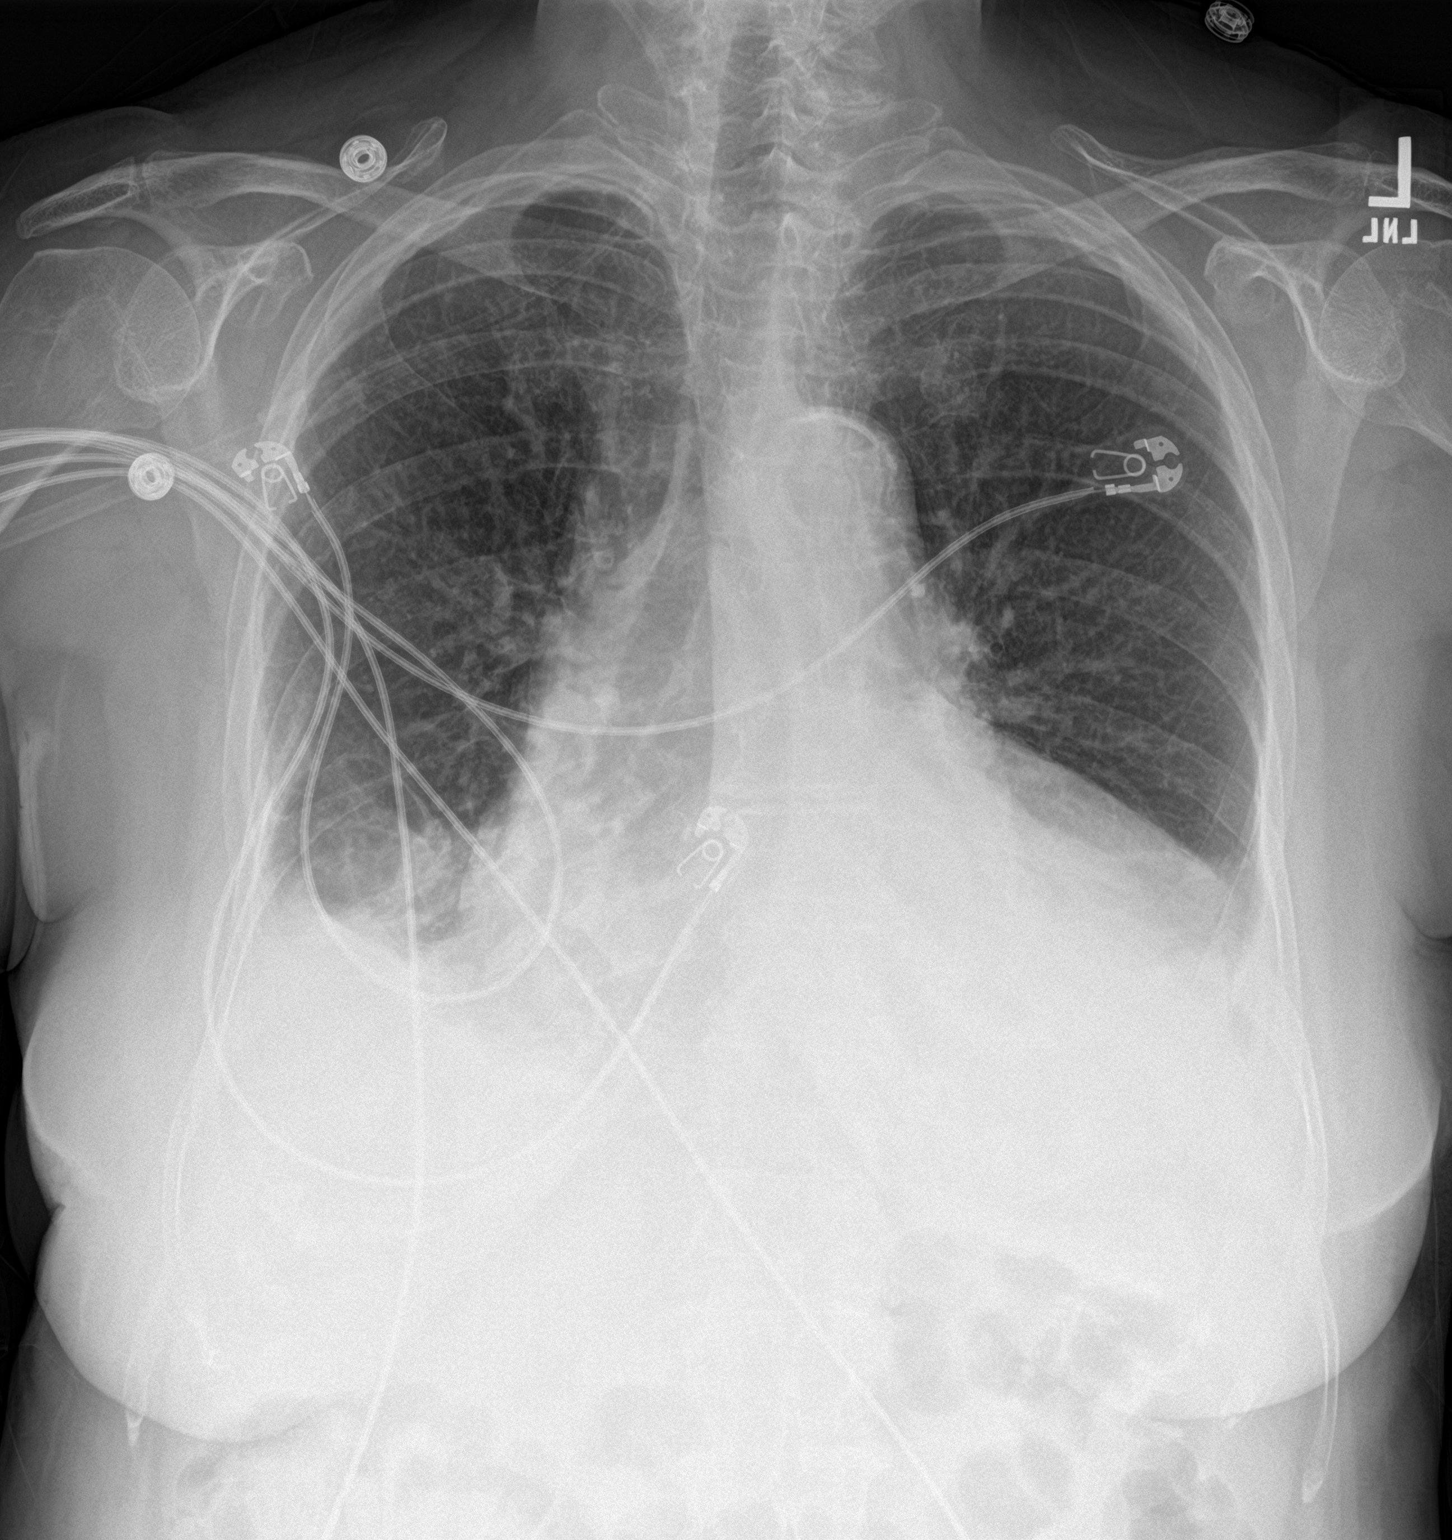

[chest lat]
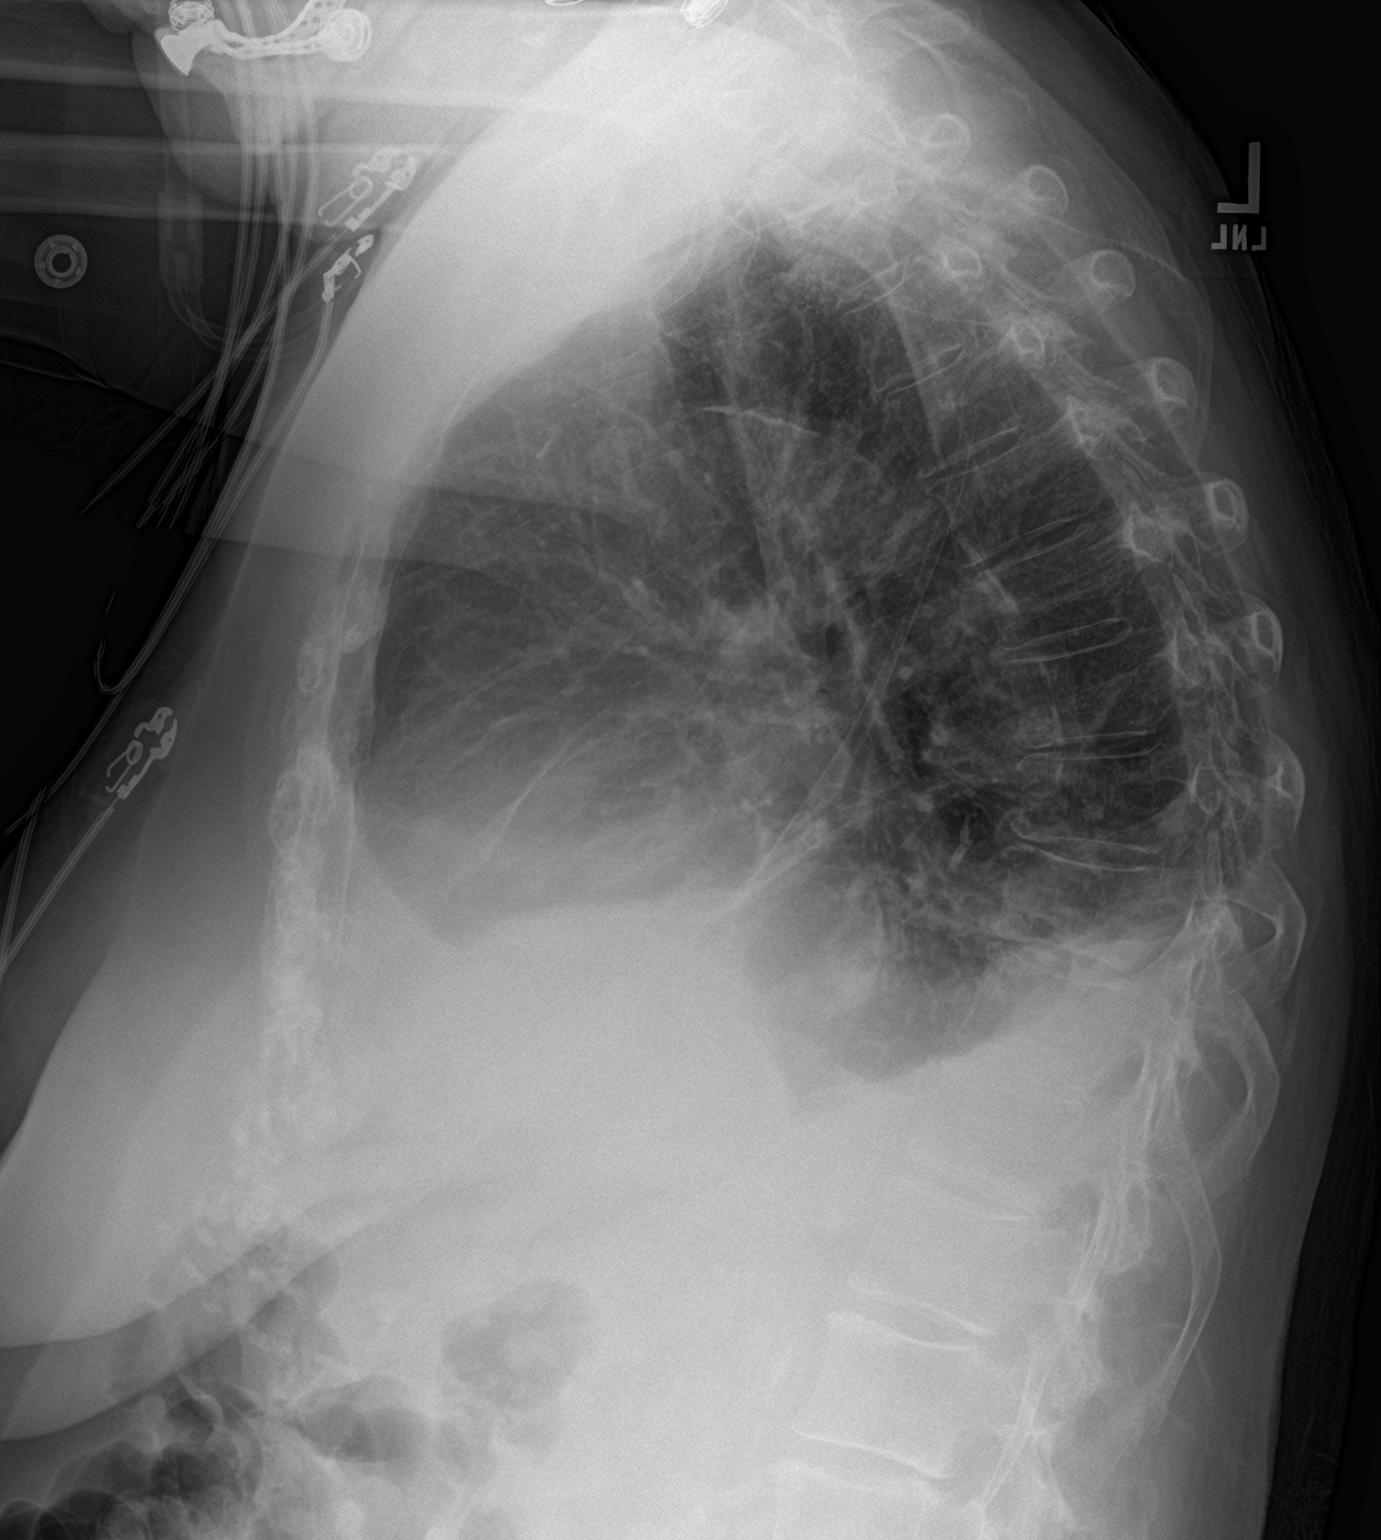

[2 of 2 positions shown; findings below may reference images not displayed]

FINDINGS: Stable cardiomediastinal silhouette with mild cardiomegaly. No
pneumothorax. Small bilateral pleural effusions, increased
bilaterally. No overt pulmonary edema. Patchy bibasilar lung
opacities, slightly increased bilaterally.
IMPRESSION: 1. Patchy bibasilar lung opacities, slightly increased bilaterally,
which could represent aspiration, pneumonia and/or atelectasis.
2. Small bilateral pleural effusions, increased bilaterally.
3. Stable cardiomegaly without overt pulmonary edema.

## 2019-02-04 DIAGNOSIS — M1711 Unilateral primary osteoarthritis, right knee: Secondary | ICD-10-CM | POA: Diagnosis not present

## 2019-02-04 DIAGNOSIS — M1712 Unilateral primary osteoarthritis, left knee: Secondary | ICD-10-CM | POA: Diagnosis not present

## 2019-02-04 DIAGNOSIS — M170 Bilateral primary osteoarthritis of knee: Secondary | ICD-10-CM | POA: Diagnosis not present

## 2019-02-12 DIAGNOSIS — M170 Bilateral primary osteoarthritis of knee: Secondary | ICD-10-CM | POA: Diagnosis not present

## 2019-02-12 DIAGNOSIS — M1712 Unilateral primary osteoarthritis, left knee: Secondary | ICD-10-CM | POA: Diagnosis not present

## 2019-02-12 DIAGNOSIS — M1711 Unilateral primary osteoarthritis, right knee: Secondary | ICD-10-CM | POA: Diagnosis not present

## 2019-02-18 ENCOUNTER — Other Ambulatory Visit: Payer: Self-pay | Admitting: *Deleted

## 2019-02-18 MED ORDER — HYDRALAZINE HCL 10 MG PO TABS: 30.0000 mg | ORAL_TABLET | Freq: Three times a day (TID) | ORAL | 5 refills | Status: DC

## 2019-02-20 DIAGNOSIS — L821 Other seborrheic keratosis: Secondary | ICD-10-CM | POA: Diagnosis not present

## 2019-02-20 DIAGNOSIS — Z85820 Personal history of malignant melanoma of skin: Secondary | ICD-10-CM | POA: Diagnosis not present

## 2019-02-20 DIAGNOSIS — D485 Neoplasm of uncertain behavior of skin: Secondary | ICD-10-CM | POA: Diagnosis not present

## 2019-02-20 DIAGNOSIS — L720 Epidermal cyst: Secondary | ICD-10-CM | POA: Diagnosis not present

## 2019-03-14 DIAGNOSIS — Z5189 Encounter for other specified aftercare: Secondary | ICD-10-CM | POA: Diagnosis not present

## 2019-03-15 ENCOUNTER — Telehealth: Payer: Self-pay

## 2019-03-15 NOTE — Telephone Encounter (Signed)
Patient has had back surgery.  She now living at [facility] in [place]. Patient was told to contact her PCP to get approval sent over to [facility] [phone]) so she can begin her PT.  Patient can be reached at [phone].

## 2019-03-15 NOTE — Telephone Encounter (Signed)
Pt was called and told she would need to contact surgeon to sign PT orders. The Surgeon is the MD that would need to set parameters for PT and know what can be done and not done. Pt verbalized understanding and will call them

## 2019-03-25 ENCOUNTER — Other Ambulatory Visit: Payer: Self-pay | Admitting: Family Medicine

## 2019-04-03 DIAGNOSIS — M1712 Unilateral primary osteoarthritis, left knee: Secondary | ICD-10-CM | POA: Diagnosis not present

## 2019-04-05 ENCOUNTER — Other Ambulatory Visit: Payer: Self-pay | Admitting: Internal Medicine

## 2019-04-05 ENCOUNTER — Other Ambulatory Visit: Payer: Self-pay | Admitting: Family Medicine

## 2019-04-08 DIAGNOSIS — H524 Presbyopia: Secondary | ICD-10-CM | POA: Diagnosis not present

## 2019-04-08 DIAGNOSIS — M48061 Spinal stenosis, lumbar region without neurogenic claudication: Secondary | ICD-10-CM | POA: Diagnosis not present

## 2019-04-08 DIAGNOSIS — M545 Low back pain: Secondary | ICD-10-CM | POA: Diagnosis not present

## 2019-04-08 DIAGNOSIS — Z961 Presence of intraocular lens: Secondary | ICD-10-CM | POA: Diagnosis not present

## 2019-04-08 DIAGNOSIS — M170 Bilateral primary osteoarthritis of knee: Secondary | ICD-10-CM | POA: Diagnosis not present

## 2019-04-08 DIAGNOSIS — R2681 Unsteadiness on feet: Secondary | ICD-10-CM | POA: Diagnosis not present

## 2019-04-09 ENCOUNTER — Other Ambulatory Visit: Payer: Self-pay | Admitting: Family Medicine

## 2019-04-16 DIAGNOSIS — M545 Low back pain: Secondary | ICD-10-CM | POA: Diagnosis not present

## 2019-04-16 DIAGNOSIS — R2681 Unsteadiness on feet: Secondary | ICD-10-CM | POA: Diagnosis not present

## 2019-04-16 DIAGNOSIS — M48061 Spinal stenosis, lumbar region without neurogenic claudication: Secondary | ICD-10-CM | POA: Diagnosis not present

## 2019-04-16 DIAGNOSIS — M170 Bilateral primary osteoarthritis of knee: Secondary | ICD-10-CM | POA: Diagnosis not present

## 2019-04-22 ENCOUNTER — Encounter: Payer: Self-pay | Admitting: Family Medicine

## 2019-05-13 ENCOUNTER — Other Ambulatory Visit: Payer: Self-pay | Admitting: Internal Medicine

## 2019-06-03 ENCOUNTER — Ambulatory Visit: Payer: Medicare Other | Admitting: Internal Medicine

## 2019-06-10 ENCOUNTER — Ambulatory Visit: Payer: Medicare PPO | Admitting: Internal Medicine

## 2019-06-10 ENCOUNTER — Other Ambulatory Visit: Payer: Self-pay

## 2019-06-10 ENCOUNTER — Encounter: Payer: Self-pay | Admitting: Internal Medicine

## 2019-06-10 VITALS — BP 150/80 | HR 58 | Temp 96.6°F | Ht 61.0 in | Wt 176.4 lb

## 2019-06-10 DIAGNOSIS — I10 Essential (primary) hypertension: Secondary | ICD-10-CM

## 2019-06-10 DIAGNOSIS — N182 Chronic kidney disease, stage 2 (mild): Secondary | ICD-10-CM | POA: Diagnosis not present

## 2019-06-10 DIAGNOSIS — I480 Paroxysmal atrial fibrillation: Secondary | ICD-10-CM

## 2019-06-10 NOTE — Patient Instructions (Signed)
Medication Instructions:  STOP AMIODARONE   *If you need a refill on your cardiac medications before your next appointment, please call your pharmacy*  Lab Work: NONE  Testing/Procedures: NONE   Follow-Up: At [facility], you and your health needs are our priority.  As part of our continuing mission to provide you with exceptional heart care, we have created designated Provider Care Teams.  These Care Teams include your primary Cardiologist (physician) and Advanced Practice Providers (APPs -  Physician Assistants and Nurse Practitioners) who all work together to provide you with the care you need, when you need it.  We recommend signing up for the patient portal called "MyChart".  Sign up information is provided on this After Visit Summary.  MyChart is used to connect with patients for Virtual Visits (Telemedicine).  Patients are able to view lab/test results, encounter notes, upcoming appointments, etc.  Non-urgent messages can be sent to your provider as well.   To learn more about what you can do with MyChart, go to [email or web address].    Your next appointment:   12 month(s)  The format for your next appointment:   In Person  Provider:   You may see [name], MD or one of the following Advanced Practice Providers on your designated Care Team:    Almyra Deforest, PA-C  Fabian Sharp, PA-C or   Roby Lofts, [place]

## 2019-06-10 NOTE — Progress Notes (Signed)
OFFICE NOTE  Chief Complaint:  Routine follow-up  Primary Care Physician: Brenda Sou, MD  HPI:  Brenda Matthews is a 84 y.o. 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 After induction with anesthesia today, she was noted to go into a-fib with RVR which persists. She has no awareness of this. Risk factors include age, HTN and dyslipidemia. Mom had several strokes, but no known a-fib. Exam benign except irregularly irregular rhythm, post-operative ankle. Apparently had a stress test in 2008 which was negative. She was placed on Xarelto and a low-dose beta blocker. Unfortunately blood pressure was low and she was taken off of her medication. She spontaneously converted while in the hospital and I encouraged her to stay on the [place]. She returns today for follow-up. She reports that she is very anxious to get out of her cast. Hopefully soon she will be in a walking boot. She denies any bleeding problems on the Xarelto. She does not report any recurrent atrial fibrillation.  12/16/2016  Brenda Matthews returns today for follow-up.  She denies any recurrent atrial fibrillation.  Since I last saw her she is done fairly well.  Blood pressure is normal today 124/66.  EKG shows sinus rhythm at 67, personally reviewed.  She remains anticoagulated for paroxysmal atrial fibrillation and chads vas score of 3.  She was started on anticoagulation while hospitalized by the orthopedist and was switched from the orthopedic dose of Xarelto to 15 mg daily.  I reviewed lab work, specifically, her renal function which is normal, and advised her that she should be on the full 20 mg dose of Xarelto.  10/12/2017  Brenda Matthews is seen today in follow-up.  Overall she is doing well.  She denies any recurrent A. fib.  Recently she saw her PCP was noted to have an elevated creatinine up to 1.7 from 1.1.  He decreased her Lasix  from 80 to 40 mg daily.  She had previously been on 20 mg of Xarelto however given her acute kidney injury and GFR less than 35 the dose was decreased to 15 mg.  She is on amiodarone 400 mg daily.  11/30/2018  Brenda Matthews returns today for follow-up and preoperative cardiovascular clearance.  She is scheduled to have a colonoscopy on December 1 and will need to hold her Xarelto 3 days prior to that.  I do not have any problem with it.  Her blood pressure was elevated today.  She had some confusion about amiodarone.  Per the pharmacy the bottle says she is supposed to take 400 mg twice daily however we had cut the dose back to 19m daily on her last office visit.  She says she is actually only taking 1 tablet daily which would be 200 mg daily.  Her EKG shows she is maintaining sinus bradycardia 56 with first-degree AV block.  06/10/2019  [name] seen today in follow-up.  Overall she continues to do well.  She is maintaining sinus rhythm.  I have been progressively decreasing her amiodarone.  At this point my concern is the long-term side effects of the medication.  Since she had A. fib that was associated with pneumonia and has had no recurrence, I think we could consider discontinuing that medication.  She is compliant with Xarelto, which is reduced dose due to her chronic kidney disease.  PMHx:  Past Medical History:  Diagnosis Date  . Abnormal EKG  approx 2008   Nuclear stress test neg;   . Anxiety    with panic  . CAP (community acquired pneumonia) 08/2017   Hospitalization for CAP/acute diast HF/rapid a-fib  . Cataract    s/p surgery--lens implants  . Chronic diastolic heart failure ([place]) 2019  . Chronic renal insufficiency, stage 3 (moderate) 12/04/2012   Renal u/s when in hosp 08/2017 for CAP/CHF showed symmetric kidneys, echogenicity normal, w/out hydronephrosis. Baseline GFR around 40 ml/min as of 10/2018.  . DDD (degenerative disc disease), lumbar   . Diverticulosis 2009  . Fracture of  radial shaft, left, closed 11/16/10   fell down flight of stairs  . History of kidney stones   . Hx of adenomatous colonic polyps 2002;2009;2015   surveillance colonoscopy 2009, +polypectomy done-tubular adenoma w/out high grade.  05/2013 tubular adenomas--recall 3 yrs  . Hyperlipidemia   . Hypertension   . Low TSH level 02/18/2016   T3 norm, T4 mildly elevated--suspected sick euthyroid syndrome.  Repeat labs 06/2016: normal.  . Melanoma in situ ([place]) 06/2018   L LL  . Osteoarthritis of left knee    viscosupplementation injections helpful 2020/21  . Osteopenia    DEXA 08/2010; repeat DEXA 02/2015 worse: fosamax started.  06/2018 Dexa T score -2.4.  2020 maj osteop fx risk = 24%, Hip fx risk 7.3%. Rpt 2 yrs.  Marland Kitchen PAF (paroxysmal atrial fibrillation) ([place]) 02/2016   when in post-op for ankle surgery; spontaneously converted in hosp, seen by Dr. Debara Pickett in consultation--metoprolol rate control + xarelto recommended.  Metop d/c due to hypot.  Plan to cont xarelto 20 mg qd indef due to CHAD-VASc score of 3.  A-fib w/RVR and CHF 07/2017; pt placed on amiodarone and plan for CV, but pt was in sinus rhythm when she went in for her DC CV, so she was sent home.  . Peripheral edema   . Pneumonia 2015   hx with sepsis  . Rheumatic fever     Past Surgical History:  Procedure Laterality Date  . APPENDECTOMY  1966   done during surgery for tubal pregnancy  . CATARACT EXTRACTION W/ INTRAOCULAR LENS IMPLANT  2013   bilat  . COLONOSCOPY W/ POLYPECTOMY  05/2013   +diverticulosis; recall 3 yrs (Dr. Henrene Pastor)  . DEXA  02/2015; 06/2018   T score -2.1 in both femoral necks; FRAX 10 yr risk of major osteoporotic fracture was 21%---fosamax started. 06/2018 T score -2.4.  repeat 1 yr.  Marland Kitchen EYE SURGERY    . LEFT HEART CATH AND CORONARY ANGIOGRAPHY N/A 09/01/2017   No angiographically significant CAD.  Upper normal left ventricular filling pressure.  Procedure: LEFT HEART CATH AND CORONARY ANGIOGRAPHY;  Surgeon: Nelva Bush, MD;  Location: [place] [facility];  Service: Cardiovascular;  Laterality: N/A;  . LUMBAR LAMINECTOMY/DECOMPRESSION MICRODISCECTOMY N/A 09/26/2018   Procedure: Decompressive Lumbar Laminectomy L5 S1 FORAMINOTOMY L5 S1  NERVE ROOT BILATERALLY and Microdiscectomy L5-S1 Left;  Surgeon: Latanya Maudlin, MD;  Location: [facility];  Service: Orthopedics;  Laterality: N/A;  13mn  . OPEN REDUCTION INTERNAL FIXATION (ORIF) TIBIA/FIBULA FRACTURE Left 02/18/2016   Procedure: OPEN REDUCTION INTERNAL FIXATION (ORIF) Right ankle trimalleolar fracture;  Surgeon: JWylene Simmer MD;  Location: [facility]  Service: Orthopedics;  Laterality: Left;  requests 952ms  . ORIF RADIAL FRACTURE  11/18/10   left; s/p slip on slippery floor and fell  . THORACENTESIS  08/2017   diagnostic and therapeutic.  Transudative.  Clx neg.  (+pulm edema/diastolic HF)  . TONSILLECTOMY    .  TRANSTHORACIC ECHOCARDIOGRAM  02/18/2016; 08/10/17   LVEF of 55-60%, mild AI and mild MR and normal biatrial size.  07/2017--normal LV function, mild enlarge aortic root, mild/mod TR, bilat atrial enlargement.    FAMHx:  Family History  Problem Relation Age of Onset  . Heart disease Mother   . Heart disease Father   . Hypertension Brother   . Diabetes Sister   . Colon cancer Neg Hx   . Pancreatic cancer Neg Hx   . Rectal cancer Neg Hx   . Stomach cancer Neg Hx     SOCHx:   reports that she has never smoked. She has never used smokeless tobacco. She reports current alcohol use. She reports that she does not use drugs.  ALLERGIES:  Allergies  Allergen Reactions  . Augmentin [Amoxicillin-Pot Clavulanate] Nausea And Vomiting and Other (See Comments)    "projectile vomiting" Has patient had a PCN reaction causing immediate rash, facial/tongue/throat swelling, SOB or lightheadedness with hypotension:No Has patient had a PCN reaction causing severe rash involving mucus membranes or skin necrosis:No Has patient had a PCN reaction that required  hospitalization:No Has patient had a PCN reaction occurring within the last 10 years:Yes If all of the above answers are "NO", then may proceed with Cephalosporin use.     ROS: Pertinent items noted in HPI and remainder of comprehensive ROS otherwise negative.  HOME MEDS: Current Outpatient Medications on File Prior to Visit  Medication Sig Dispense Refill  . albuterol (VENTOLIN HFA) 108 (90 Base) MCG/ACT inhaler INHALE 2 PUFFS INTO THE LUNGS EVERY 4 HOURS AS NEEDED FOR WHEEZING OR SHORTNESS OF BREATH 8.5 g 1  . ALPRAZolam (XANAX) 0.5 MG tablet TAKE 1 TABLET BY MOUTH 3 TIMES DAILY AS NEEDED FOR STRESS 90 tablet 5  . amiodarone (PACERONE) 200 MG tablet Take 0.5 tablets (100 mg total) by mouth daily. 45 tablet 3  . Apoaequorin (PREVAGEN PO) Take 1 tablet by mouth daily.    Marland Kitchen atorvastatin (LIPITOR) 40 MG tablet TAKE 1 TABLET BY MOUTH EVERY DAY 90 tablet 1  . Biotin 5000 MCG TABS Take 5,000 mcg by mouth daily.    . Calcium Carbonate (CALCIUM 600 PO) Take 1,200 mg by mouth daily.     . carboxymethylcellulose (REFRESH PLUS) 0.5 % SOLN Place 2 drops into both eyes daily as needed (dry/irritated eyes.).     Marland Kitchen Coenzyme Q10 ([employer]S COQ-10) 200 MG capsule Take 200 mg by mouth daily.    Marland Kitchen CRANBERRY PO Take 2 tablets by mouth daily.    Marland Kitchen docusate sodium (COLACE) 100 MG capsule Take 100 mg by mouth daily as needed (constipation.).     Marland Kitchen fluticasone (FLONASE) 50 MCG/ACT nasal spray Place 2 sprays into both nostrils daily.     . furosemide (LASIX) 40 MG tablet TAKE 1 TABLET BY MOUTH EVERY DAY 90 tablet 1  . hydrALAZINE (APRESOLINE) 10 MG tablet TAKE 3 TABLETS BY MOUTH 3 TIMES DAILY 270 tablet 5  . metoprolol tartrate (LOPRESSOR) 100 MG tablet Take 1 tablet (100 mg total) by mouth 2 (two) times daily. 180 tablet 2  . Multiple Vitamins-Minerals (AIRBORNE PO) Take 1 tablet by mouth daily as needed (immune support).     . Multiple Vitamins-Minerals (CENTRUM SILVER ULTRA WOMENS) TABS Take 1 tablet by mouth every  evening.     . potassium chloride SA (KLOR-CON) 20 MEQ tablet Take 1 tablet (20 mEq total) by mouth 2 (two) times daily. 180 tablet 3  . sodium chloride (OCEAN) 0.65 % nasal spray  Place 2 sprays into the nose daily. After shower    . Turmeric 500 MG CAPS Take 500 mg by mouth daily.    Alveda Reasons 15 MG TABS tablet TAKE 1 TABLET BY MOUTH EVERY DAY WITH SUPPER 90 tablet 1   No current facility-administered medications on file prior to visit.    LABS/IMAGING: No results found for this or any previous visit (from the past 48 hour(s)). No results found.  WEIGHTS: Wt Readings from Last 3 Encounters:  06/10/19 176 lb 6.4 oz (80 kg)  12/12/18 165 lb (74.8 kg)  11/30/18 171 lb (77.6 kg)    VITALS: BP (!) 150/80   Pulse (!) 58   Temp (!) 96.6 F (35.9 C)   Ht 5' 1"  (1.549 m)   Wt 176 lb 6.4 oz (80 kg)   LMP  (LMP Unknown)   SpO2 97%   BMI 33.33 kg/m   EXAM: General appearance: alert and no distress Neck: no carotid bruit and no JVD Lungs: clear to auscultation bilaterally Heart: regular rate and rhythm Abdomen: soft, non-tender; bowel sounds normal; no masses,  no organomegaly Extremities: Left lower leg in a cast Pulses: 2+ and symmetric Skin: Skin color, texture, turgor normal. No rashes or lesions Neurologic: Grossly normal Psych: Pleasant  EKG: Sinus bradycardia at 58 -personally reviewed  ASSESSMENT: 1. Paroxysmal atrial fibrillation-CHADSVASC score of 3 2. History of NSTEMI with mild, non-obstructive CAD by cath (08/2017) 3. Hypertension 4. Family history of stroke  PLAN: 1.   Ms. Kochan really had any recurrent atrial fibrillation since 2019.  I have been progressively decreasing her amiodarone and would advise discontinuing it today.  We will monitor closely for recurrence.  She is on Xarelto.  She may want to consider a cardia mobile device.  Blood pressure was a little elevated today however she says at home with physical therapy it runs between 646 and 803 systolic.   No changes to her medications.  Follow-up with me annually or sooner as necessary.  [name], MD, [facility], [place] Director of the [facility]  [facility] Attending Cardiologist  Direct Dial: [phone]  Fax: [phone]  Website:  [email or web address] 06/10/2019, 1:53 PM

## 2019-06-19 ENCOUNTER — Other Ambulatory Visit: Payer: Self-pay | Admitting: Family Medicine

## 2019-06-19 NOTE — Telephone Encounter (Signed)
Requesting:alprazolam Contract:01/03/18 UDS:n/a Last Visit:11/07/18 Next Visit:advised to f/u 3 mo. Last Refill:11/27/18(90,5)  Please Advise. Medication pending

## 2019-06-19 NOTE — Telephone Encounter (Signed)
I'll do 30d supply but she needs f/u prior to any FURTHER RF's

## 2019-06-20 ENCOUNTER — Other Ambulatory Visit: Payer: Self-pay | Admitting: Family Medicine

## 2019-06-20 NOTE — Telephone Encounter (Signed)
Patient contacted and advised f/u appt needed. Appt scheduled and also notified she is not in need of refill currently.

## 2019-07-01 ENCOUNTER — Ambulatory Visit: Payer: Medicare PPO | Admitting: Family Medicine

## 2019-07-01 ENCOUNTER — Other Ambulatory Visit: Payer: Self-pay | Admitting: Family Medicine

## 2019-07-08 ENCOUNTER — Other Ambulatory Visit: Payer: Self-pay | Admitting: Internal Medicine

## 2019-07-08 ENCOUNTER — Other Ambulatory Visit: Payer: Self-pay | Admitting: Family Medicine

## 2019-07-10 ENCOUNTER — Other Ambulatory Visit: Payer: Self-pay

## 2019-07-10 ENCOUNTER — Encounter: Payer: Self-pay | Admitting: Family Medicine

## 2019-07-10 ENCOUNTER — Ambulatory Visit (INDEPENDENT_AMBULATORY_CARE_PROVIDER_SITE_OTHER): Payer: Medicare PPO | Admitting: Family Medicine

## 2019-07-10 VITALS — BP 160/80 | HR 60 | Temp 97.7°F | Resp 16 | Ht 61.0 in | Wt 177.4 lb

## 2019-07-10 DIAGNOSIS — I10 Essential (primary) hypertension: Secondary | ICD-10-CM | POA: Diagnosis not present

## 2019-07-10 DIAGNOSIS — E7800 Pure hypercholesterolemia, unspecified: Secondary | ICD-10-CM

## 2019-07-10 DIAGNOSIS — N183 Chronic kidney disease, stage 3 unspecified: Secondary | ICD-10-CM | POA: Diagnosis not present

## 2019-07-10 DIAGNOSIS — I5032 Chronic diastolic (congestive) heart failure: Secondary | ICD-10-CM

## 2019-07-10 DIAGNOSIS — I480 Paroxysmal atrial fibrillation: Secondary | ICD-10-CM

## 2019-07-10 DIAGNOSIS — Z7901 Long term (current) use of anticoagulants: Secondary | ICD-10-CM

## 2019-07-10 DIAGNOSIS — F411 Generalized anxiety disorder: Secondary | ICD-10-CM

## 2019-07-10 MED ORDER — HYDRALAZINE HCL 10 MG PO TABS: ORAL_TABLET | ORAL | 0 refills | Status: DC

## 2019-07-10 NOTE — Addendum Note (Signed)
Addended by: Deveron Furlong D on: 07/10/2019 11:24 AM   Modules accepted: Orders

## 2019-07-10 NOTE — Progress Notes (Signed)
OFFICE VISIT  07/10/2019  CC:  Chief Complaint  Patient presents with  . Follow-up    RCI, pt is not fasting   HPI:    Patient is a 84 y.o. Caucasian female who presents for 8 month f/u chronic medical problems. Her primary medical problems are CRI III (baseline GFR around 40 ml/min), GAD, chronic diastolic HF, PAF, HTN, and HLD.  INTERIM HX: She saw Dr. Debara Pickett a month ago for f/u PAF, and since she has been in sinus rhythm since 2019 even with gradual decreasing of amiodarone dose he has decided to completely d/c this med.  She will remain on low dose xarelto.  HTN:  Exercising lately more since finished with rehab s/p back surgery 09/2018. Diet is good.   Has been taking 3 of her 50m hydralazine tid --it appears that from hour historical med list this is CORRECT, so I corrected her med list today. Home bp's consistently 130/80 range.  HLD: taking statin daily.  CRI III: avoids NSAIDs.  Fluid intake appropriate.  HF: she drinks somewhere around 50-60 oz of fluids per day. Takes lasix daily.  She feels like her chronic LE swelling is stable. She wears compression hose daily.  +Low sodium diet. No SOB at rest, no orthopnea, no PND.  On flat surface she is essentially not limited by SOB.  No CP. Going up stairs induces SOB and she rests 3-5 min and feels back to normal.  No wheezing. Going down stairs--no SOB.  GAD: usually takes this hs for anxiety-induced insomnia.  Only occ use in daytime. She has plenty of alpraz and does not need new rx.  PMP AWARE reviewed today: most recent rx for alpraz 0.553mwas filled 06/19/19, # [id number] by me. No red flags.  ROS: recent L hand contusion when doing rehab, bruised a lot and now a small knot is present.  No pain or erythema.  no fevers, no CP, no wheezing, no cough, no dizziness, no HAs, no rashes, no melena/hematochezia.  No polyuria or polydipsia.  No myalgias or arthralgias.  No focal weakness, paresthesias, or tremors.  No acute vision or  hearing abnormalities. No n/v/d or abd pain.  No palpitations.     Past Medical History:  Diagnosis Date  . Abnormal EKG approx 2008   Nuclear stress test neg;   . Anxiety    with panic  . CAP (community acquired pneumonia) 08/2017   Hospitalization for CAP/acute diast HF/rapid a-fib  . Cataract    s/p surgery--lens implants  . Chronic diastolic heart failure ([place]  . Chronic renal insufficiency, stage 3 (moderate) 12/04/2012   Renal u/s when in hosp 08/2017 for CAP/CHF showed symmetric kidneys, echogenicity normal, w/out hydronephrosis. Baseline GFR around 40 ml/min as of 10/2018.  . DDD (degenerative disc disease), lumbar   . Diverticulosis 2009  . Fracture of radial shaft, left, closed 11/16/10   fell down flight of stairs  . History of kidney stones   . Hx of adenomatous colonic polyps 2002;2009;2015   surveillance colonoscopy 2009, +polypectomy done-tubular adenoma w/out high grade.  05/2013 tubular adenomas--recall 3 yrs  . Hyperlipidemia   . Hypertension   . Low TSH level 02/18/2016   T3 norm, T4 mildly elevated--suspected sick euthyroid syndrome.  Repeat labs 06/2016: normal.  . Melanoma in situ ([place]   L LL  . Osteoarthritis of left knee    viscosupplementation injections helpful 2020/21  . Osteopenia    DEXA 08/2010; repeat DEXA 02/2015 worse: fosamax  started.  06/2018 Dexa T score -2.4.  2020 maj osteop fx risk = 24%, Hip fx risk 7.3%. Rpt 2 yrs.  Marland Kitchen PAF (paroxysmal atrial fibrillation) ([place]) 02/2016   when in post-op for ankle surgery; spontaneously converted in hosp, seen by Dr. Debara Pickett in consultation--metoprolol rate control + xarelto recommended.  Metop d/c due to hypot.  Plan to cont xarelto 20 mg qd indef due to CHAD-VASc score of 3.  A-fib w/RVR and CHF 07/2017; pt placed on amiodarone and plan for CV, but pt was in sinus rhythm when she went in for her DC CV, so she was sent home.  . Peripheral edema   . Pneumonia 2015   hx with sepsis  . Rheumatic fever      Past Surgical History:  Procedure Laterality Date  . APPENDECTOMY  1966   done during surgery for tubal pregnancy  . CATARACT EXTRACTION W/ INTRAOCULAR LENS IMPLANT  2013   bilat  . COLONOSCOPY W/ POLYPECTOMY  05/2013   +diverticulosis; recall 3 yrs (Dr. Henrene Pastor)  . DEXA  02/2015; 06/2018   T score -2.1 in both femoral necks; FRAX 10 yr risk of major osteoporotic fracture was 21%---fosamax started. 06/2018 T score -2.4.  repeat 1 yr.  Marland Kitchen EYE SURGERY    . LEFT HEART CATH AND CORONARY ANGIOGRAPHY N/A 09/01/2017   No angiographically significant CAD.  Upper normal left ventricular filling pressure.  Procedure: LEFT HEART CATH AND CORONARY ANGIOGRAPHY;  Surgeon: Nelva Bush, MD;  Location: [place] [facility];  Service: Cardiovascular;  Laterality: N/A;  . LUMBAR LAMINECTOMY/DECOMPRESSION MICRODISCECTOMY N/A 09/26/2018   Procedure: Decompressive Lumbar Laminectomy L5 S1 FORAMINOTOMY L5 S1  NERVE ROOT BILATERALLY and Microdiscectomy L5-S1 Left;  Surgeon: Latanya Maudlin, MD;  Location: [facility];  Service: Orthopedics;  Laterality: N/A;  114mn  . OPEN REDUCTION INTERNAL FIXATION (ORIF) TIBIA/FIBULA FRACTURE Left 02/18/2016   Procedure: OPEN REDUCTION INTERNAL FIXATION (ORIF) Right ankle trimalleolar fracture;  Surgeon: JWylene Simmer MD;  [place] [facility]  Service: Orthopedics;  Laterality: Left;  requests 982ms  . ORIF RADIAL FRACTURE  11/18/10   left; s/p slip on slippery floor and fell  . THORACENTESIS  08/2017   diagnostic and therapeutic.  Transudative.  Clx neg.  (+pulm edema/diastolic HF)  . TONSILLECTOMY    . TRANSTHORACIC ECHOCARDIOGRAM  02/18/2016; 08/10/17   LVEF of 55-60%, mild AI and mild MR and normal biatrial size.  07/2017--normal LV function, mild enlarge aortic root, mild/mod TR, bilat atrial enlargement.    Outpatient Medications Prior to Visit  Medication Sig Dispense Refill  . Acetaminophen (TYLENOL 8 HOUR ARTHRITIS PAIN PO) Take by mouth in the morning and at bedtime.    . Marland Kitchenalbuterol (VENTOLIN HFA) 108 (90 Base) MCG/ACT inhaler INHALE 2 PUFFS INTO THE LUNGS EVERY 4 HOURS AS NEEDED FOR WHEEZING OR SHORTNESS OF BREATH 8.5 g 1  . ALPRAZolam (XANAX) 0.5 MG tablet TAKE 1 TABLET BY MOUTH 3 TIMES DAILY AS NEEDED FOR STRESS 90 tablet 0  . Apoaequorin (PREVAGEN PO) Take 1 tablet by mouth daily.    . Marland Kitchentorvastatin (LIPITOR) 40 MG tablet TAKE 1 TABLET BY MOUTH EVERY DAY 90 tablet 0  . Biotin 5000 MCG TABS Take 5,000 mcg by mouth daily.    . Calcium Carbonate (CALCIUM 600 PO) Take 1,200 mg by mouth daily.     . carboxymethylcellulose (REFRESH PLUS) 0.5 % SOLN Place 2 drops into both eyes daily as needed (dry/irritated eyes.).     . Marland Kitchenoenzyme Q10 (CVS COQ-10) 200  MG capsule Take 200 mg by mouth daily.    . fluticasone (FLONASE) 50 MCG/ACT nasal spray Place 2 sprays into both nostrils daily.     . furosemide (LASIX) 40 MG tablet TAKE 1 TABLET BY MOUTH EVERY DAY 90 tablet 0  . metoprolol tartrate (LOPRESSOR) 100 MG tablet Take 1 tablet (100 mg total) by mouth 2 (two) times daily. 180 tablet 2  . Multiple Vitamins-Minerals (CENTRUM SILVER ULTRA WOMENS) TABS Take 1 tablet by mouth every evening.     . potassium chloride SA (KLOR-CON) 20 MEQ tablet Take 1 tablet (20 mEq total) by mouth 2 (two) times daily. 180 tablet 3  . Turmeric 500 MG CAPS Take 500 mg by mouth daily.    Alveda Reasons 15 MG TABS tablet TAKE 1 TABLET BY MOUTH EVERY DAY WITH SUPPER 90 tablet 1  . hydrALAZINE (APRESOLINE) 10 MG tablet TAKE 2 TABLETS BY MOUTH 3 TIMES DAILY 540 tablet 0  . Multiple Vitamins-Minerals (AIRBORNE PO) Take 1 tablet by mouth daily as needed (immune support).  (Patient not taking: Reported on 07/10/2019)    . sodium chloride (OCEAN) 0.65 % nasal spray Place 2 sprays into the nose daily. After shower (Patient not taking: Reported on 07/10/2019)    . amiodarone (PACERONE) 200 MG tablet     . CRANBERRY PO Take 2 tablets by mouth daily. (Patient not taking: Reported on 07/10/2019)    . docusate sodium  (COLACE) 100 MG capsule Take 100 mg by mouth daily as needed (constipation.).  (Patient not taking: Reported on 07/10/2019)     No facility-administered medications prior to visit.    Allergies  Allergen Reactions  . Augmentin [Amoxicillin-Pot Clavulanate] Nausea And Vomiting and Other (See Comments)    "projectile vomiting" Has patient had a PCN reaction causing immediate rash, facial/tongue/throat swelling, SOB or lightheadedness with hypotension:No Has patient had a PCN reaction causing severe rash involving mucus membranes or skin necrosis:No Has patient had a PCN reaction that required hospitalization:No Has patient had a PCN reaction occurring within the last 10 years:Yes If all of the above answers are "NO", then may proceed with Cephalosporin use.     ROS As per HPI  PE: Vitals with BMI 07/10/2019 06/10/2019 12/12/2018  Height 5' 1"  5' 1"  -  Weight 177 lbs 6 oz 176 lbs 6 oz -  BMI 64.33 29.51 -  Systolic 884 166 063  Diastolic 80 80 77  Pulse 60 58 -  O2 sat on RA today 94%.  Gen: Alert, well appearing.  Patient is oriented to person, place, time, and situation. AFFECT: pleasant, lucid thought and speech. CV: RRR, no m/r/g.   LUNGS: CTA bilat, nonlabored resps, good aeration in all lung fields. EXT: no clubbing or cyanosis.  2+ bilat LL pitting (R a bit more than L) edema--palpated today through her compression stockings. SKIN: no signif bruising.  No pallor or jaundice. L hand/wrist dorsal surface between thumb and index finger there is a marble sized soft and non-tender nodule c/w hematoma.   LABS:  Lab Results  Component Value Date   TSH 0.68 10/22/2018   Lab Results  Component Value Date   WBC 7.2 12/12/2018   HGB 13.2 12/12/2018   HCT 42.8 12/12/2018   MCV 95.3 12/12/2018   PLT 192 12/12/2018   Lab Results  Component Value Date   CREATININE 1.23 (H) 09/21/2018   BUN 34 (H) 09/21/2018   NA 141 09/21/2018   K 4.6 09/21/2018   CL 105 09/21/2018  CO2  28 09/21/2018   Lab Results  Component Value Date   ALT 39 09/21/2018   AST 32 09/21/2018   ALKPHOS 83 09/21/2018   BILITOT 0.9 09/21/2018   Lab Results  Component Value Date   CHOL 192 09/10/2018   Lab Results  Component Value Date   HDL 73.10 09/10/2018   Lab Results  Component Value Date   LDLCALC 50 07/27/2017   Lab Results  Component Value Date   TRIG 220.0 (H) 09/10/2018   Lab Results  Component Value Date   CHOLHDL 3 09/10/2018   IMPRESSION AND PLAN:  1) HTN: The current medical regimen is effective;  continue present plan and medications. Lytes/cr today.  2) HLD: tolerating statin. FLP next visit when fasting.   3) CRI III: hydrates approp (in the setting of coexisting diastolic hf), avoids NSAIDs. Lytes/cr today.  4) GAD: The current medical regimen is effective;  continue present plan and medications. Uses alpraz appropriately. Updated CSC today. No new rx needed today.  5) PAF: in sinus rhythm for quite some time. Dr. Debara Pickett took her off amiodarone 1 mo ago. Continue xarelto 5m qd. Monitoring CBC today.  6) Small L hand hematoma: reassured. Expect gradual spontaneous resolution.  An After Visit Summary was printed and given to the patient.  FOLLOW UP: Return in about 3 months (around 10/10/2019) for annual CPE (fasting).  Signed:  PCrissie Sickles MD           07/10/2019

## 2019-07-17 ENCOUNTER — Ambulatory Visit: Payer: Medicare PPO

## 2019-07-17 ENCOUNTER — Telehealth: Payer: Self-pay

## 2019-07-17 NOTE — Telephone Encounter (Signed)
Please assist patient with rescheduling lab visit scheduled for today.   Client [name] - [name] Physician Crissie Sickles - MD Contact Type Call Who Is Calling Patient / Member / Family / Caregiver Caller Name [place] Phone Number [phone] Patient Name Brenda Matthews Patient DOB 31-Oct-1935 Call Type Message Only Information Provided Reason for Call Request to Reschedule Office Appointment Initial Comment caller has an 9:30 appt for blood work but she has a family emergency and have to reschedule and can it be an earlier appt Disp. Time Disposition Final User 07/17/2019 7:34:03 AM General Information Provided Yes Kenton Kingfisher, [name] Call Closed By: Nelia Shi Transaction Date/Time: 07/17/2019 7:31:29 AM (ET)

## 2019-07-17 NOTE — Telephone Encounter (Signed)
Done. Patient scheduled for 07/26/2019

## 2019-07-26 ENCOUNTER — Ambulatory Visit (INDEPENDENT_AMBULATORY_CARE_PROVIDER_SITE_OTHER): Payer: Medicare PPO | Admitting: Family Medicine

## 2019-07-26 ENCOUNTER — Other Ambulatory Visit: Payer: Self-pay

## 2019-07-26 DIAGNOSIS — N183 Chronic kidney disease, stage 3 unspecified: Secondary | ICD-10-CM

## 2019-07-26 DIAGNOSIS — I10 Essential (primary) hypertension: Secondary | ICD-10-CM | POA: Diagnosis not present

## 2019-07-26 DIAGNOSIS — Z7901 Long term (current) use of anticoagulants: Secondary | ICD-10-CM | POA: Diagnosis not present

## 2019-07-26 DIAGNOSIS — E7800 Pure hypercholesterolemia, unspecified: Secondary | ICD-10-CM | POA: Diagnosis not present

## 2019-07-26 DIAGNOSIS — I480 Paroxysmal atrial fibrillation: Secondary | ICD-10-CM

## 2019-07-26 DIAGNOSIS — I5032 Chronic diastolic (congestive) heart failure: Secondary | ICD-10-CM

## 2019-07-26 LAB — CBC
HCT: 38.6 % (ref 36.0–46.0)
Hemoglobin: 12.6 g/dL (ref 12.0–15.0)
MCHC: 32.6 g/dL (ref 30.0–36.0)
MCV: 89.1 fl (ref 78.0–100.0)
Platelets: 189 10*3/uL (ref 150.0–400.0)
RBC: 4.33 Mil/uL (ref 3.87–5.11)
RDW: 16.6 % — ABNORMAL HIGH (ref 11.5–15.5)
WBC: 5.4 10*3/uL (ref 4.0–10.5)

## 2019-07-26 LAB — HM MAMMOGRAPHY

## 2019-07-26 LAB — COMPREHENSIVE METABOLIC PANEL
ALT: 12 U/L (ref 0–35)
AST: 17 U/L (ref 0–37)
Albumin: 4.1 g/dL (ref 3.5–5.2)
Alkaline Phosphatase: 69 U/L (ref 39–117)
BUN: 27 mg/dL — ABNORMAL HIGH (ref 6–23)
CO2: 29 mEq/L (ref 19–32)
Calcium: 9.5 mg/dL (ref 8.4–10.5)
Chloride: 106 mEq/L (ref 96–112)
Creatinine, Ser: 1.17 mg/dL (ref 0.40–1.20)
GFR: 44.23 mL/min — ABNORMAL LOW (ref >60.00)
Glucose, Bld: 94 mg/dL (ref 70–99)
Potassium: 4.4 mEq/L (ref 3.5–5.1)
Sodium: 142 mEq/L (ref 135–145)
Total Bilirubin: 0.7 mg/dL (ref 0.2–1.2)
Total Protein: 6.4 g/dL (ref 6.0–8.3)

## 2019-07-30 ENCOUNTER — Telehealth: Payer: Self-pay

## 2019-07-30 ENCOUNTER — Encounter: Payer: Self-pay | Admitting: Family Medicine

## 2019-07-30 NOTE — Telephone Encounter (Signed)
Received results for recent mammogram. HM updated and patient was made aware of results already. Placed on PCP desk for review

## 2019-08-02 ENCOUNTER — Other Ambulatory Visit: Payer: Self-pay | Admitting: Family Medicine

## 2019-09-07 ENCOUNTER — Other Ambulatory Visit: Payer: Self-pay | Admitting: Family Medicine

## 2019-10-09 ENCOUNTER — Telehealth: Payer: Self-pay

## 2019-10-09 ENCOUNTER — Other Ambulatory Visit: Payer: Self-pay

## 2019-10-09 NOTE — Telephone Encounter (Signed)
Brenda Matthews from [place] reports patients BP is 197/108 manually. Patient denies CP, SOB, headache, blurred vision. States she is compliant with medications.   Patient scheduled for acute visit with PCP on 10/10/19.

## 2019-10-09 NOTE — Telephone Encounter (Signed)
Noted  

## 2019-10-10 ENCOUNTER — Encounter: Payer: Self-pay | Admitting: Family Medicine

## 2019-10-10 ENCOUNTER — Other Ambulatory Visit: Payer: Self-pay

## 2019-10-10 ENCOUNTER — Ambulatory Visit (INDEPENDENT_AMBULATORY_CARE_PROVIDER_SITE_OTHER): Payer: Medicare PPO | Admitting: Family Medicine

## 2019-10-10 VITALS — BP 174/83 | HR 63 | Temp 97.4°F | Resp 18 | Ht 61.0 in | Wt 173.1 lb

## 2019-10-10 DIAGNOSIS — N183 Chronic kidney disease, stage 3 unspecified: Secondary | ICD-10-CM

## 2019-10-10 DIAGNOSIS — Z23 Encounter for immunization: Secondary | ICD-10-CM | POA: Diagnosis not present

## 2019-10-10 DIAGNOSIS — I10 Essential (primary) hypertension: Secondary | ICD-10-CM

## 2019-10-10 MED ORDER — CHLORTHALIDONE 25 MG PO TABS
25.0000 mg | ORAL_TABLET | Freq: Every day | ORAL | 0 refills | Status: DC
Start: 2019-10-10 — End: 2019-10-18

## 2019-10-10 NOTE — Addendum Note (Signed)
Addended byDamita Dunnings D on: 10/10/2019 03:40 PM   Modules accepted: Orders

## 2019-10-10 NOTE — Progress Notes (Signed)
OFFICE VISIT  10/10/2019  CC:  Chief Complaint  Patient presents with  . Hypertension    checked at [facility], was in [place] yesterday, denies CP, SOB    HPI:    Patient is a 84 y.o. Caucasian female who presents for elevated blood pressures in the context of chronic hypertension, chronic diastolic HF, PAF on chronic anticoag, and CRI III. Hydralazine and lopressor are her chronic bp meds.  HPI: Was having bp cuff problems---no working at all. Got a new one to compare to nurse's manual bp cuff at [place]. The cuffs matched and showed systolic 824 initially.  Patient had no HA, vision c/o, or any other sx's.  She feels well. No sudafed.  No NSAIDs.  BP control prior to her cuff breaking was ok, 140-150 over 80 or less. Unknown HRs. Has been taking her bp meds.    Past Medical History:  Diagnosis Date  . Abnormal EKG approx 2008   Nuclear stress test neg;   . Anxiety    with panic  . CAP (community acquired pneumonia) 08/2017   Hospitalization for CAP/acute diast HF/rapid a-fib  . Cataract    s/p surgery--lens implants  . Chronic diastolic heart failure ([place]) 2019  . Chronic renal insufficiency, stage 3 (moderate) 12/04/2012   Renal u/s when in hosp 08/2017 for CAP/CHF showed symmetric kidneys, echogenicity normal, w/out hydronephrosis. Baseline GFR around 40 ml/min as of 10/2018.  . DDD (degenerative disc disease), lumbar   . Diverticulosis 2009  . Fracture of radial shaft, left, closed 11/16/10   fell down flight of stairs  . History of kidney stones   . Hx of adenomatous colonic polyps 2002;2009;2015   surveillance colonoscopy 2009, +polypectomy done-tubular adenoma w/out high grade.  05/2013 tubular adenomas--recall 3 yrs  . Hyperlipidemia   . Hypertension   . Low TSH level 02/18/2016   T3 norm, T4 mildly elevated--suspected sick euthyroid syndrome.  Repeat labs 06/2016: normal.  . Melanoma in situ ([place]) 06/2018   L LL  . Osteoarthritis of left knee     viscosupplementation injections helpful 2020/21  . Osteopenia    DEXA 08/2010; repeat DEXA 02/2015 worse: fosamax started.  06/2018 Dexa T score -2.4.  2020 maj osteop fx risk = 24%, Hip fx risk 7.3%. Rpt 2 yrs.  Marland Kitchen PAF (paroxysmal atrial fibrillation) ([place]) 02/2016   when in post-op for ankle surgery; spontaneously converted in hosp, seen by Dr. Debara Pickett in consultation--metoprolol rate control + xarelto recommended.  Metop d/c due to hypot.  Plan to cont xarelto 20 mg qd indef due to CHAD-VASc score of 3.  A-fib w/RVR and CHF 07/2017; pt placed on amiodarone and plan for CV, but pt was in sinus rhythm when she went in for her DC CV, so she was sent home.  . Peripheral edema   . Pneumonia 2015   hx with sepsis  . Rheumatic fever     Past Surgical History:  Procedure Laterality Date  . APPENDECTOMY  1966   done during surgery for tubal pregnancy  . CATARACT EXTRACTION W/ INTRAOCULAR LENS IMPLANT  2013   bilat  . COLONOSCOPY W/ POLYPECTOMY  05/2013   +diverticulosis; recall 3 yrs (Dr. Henrene Pastor)  . DEXA  02/2015; 06/2018   T score -2.1 in both femoral necks; FRAX 10 yr risk of major osteoporotic fracture was 21%---fosamax started. 06/2018 T score -2.4.  repeat 1 yr.  Marland Kitchen EYE SURGERY    . LEFT HEART CATH AND CORONARY ANGIOGRAPHY N/A 09/01/2017  No angiographically significant CAD.  Upper normal left ventricular filling pressure.  Procedure: LEFT HEART CATH AND CORONARY ANGIOGRAPHY;  Surgeon: Nelva Bush, MD;  Location: [facility];  Service: Cardiovascular;  Laterality: N/A;  . LUMBAR LAMINECTOMY/DECOMPRESSION MICRODISCECTOMY N/A 09/26/2018   Procedure: Decompressive Lumbar Laminectomy L5 S1 FORAMINOTOMY L5 S1  NERVE ROOT BILATERALLY and Microdiscectomy L5-S1 Left;  Surgeon: Latanya Maudlin, MD;  Location: [facility];  Service: Orthopedics;  Laterality: N/A;  118mn  . OPEN REDUCTION INTERNAL FIXATION (ORIF) TIBIA/FIBULA FRACTURE Left 02/18/2016   Procedure: OPEN REDUCTION INTERNAL FIXATION (ORIF) Right  ankle trimalleolar fracture;  Surgeon: JWylene Simmer MD;  [facility]  Service: Orthopedics;  Laterality: Left;  requests 924ms  . ORIF RADIAL FRACTURE  11/18/10   left; s/p slip on slippery floor and fell  . THORACENTESIS  08/2017   diagnostic and therapeutic.  Transudative.  Clx neg.  (+pulm edema/diastolic HF)  . TONSILLECTOMY    . TRANSTHORACIC ECHOCARDIOGRAM  02/18/2016; 08/10/17   LVEF of 55-60%, mild AI and mild MR and normal biatrial size.  07/2017--normal LV function, mild enlarge aortic root, mild/mod TR, bilat atrial enlargement.    Outpatient Medications Prior to Visit  Medication Sig Dispense Refill  . Acetaminophen (TYLENOL 8 HOUR ARTHRITIS PAIN PO) Take by mouth in the morning and at bedtime.    . Marland Kitchenlbuterol (VENTOLIN HFA) 108 (90 Base) MCG/ACT inhaler INHALE 2 PUFFS INTO THE LUNGS EVERY 4 HOURS AS NEEDED FOR WHEEZING OR SHORTNESS OF BREATH 8.5 g 1  . ALPRAZolam (XANAX) 0.5 MG tablet TAKE 1 TABLET BY MOUTH 3 TIMES DAILY AS NEEDED FOR STRESS 90 tablet 0  . Apoaequorin (PREVAGEN PO) Take 1 tablet by mouth daily.    . Marland Kitchentorvastatin (LIPITOR) 40 MG tablet TAKE 1 TABLET BY MOUTH EVERY DAY 90 tablet 0  . Biotin 5000 MCG TABS Take 5,000 mcg by mouth daily.    . Calcium Carbonate (CALCIUM 600 PO) Take 1,200 mg by mouth daily.     . carboxymethylcellulose (REFRESH PLUS) 0.5 % SOLN Place 2 drops into both eyes daily as needed (dry/irritated eyes.).     . Marland Kitchenoenzyme Q10 ([employer]S COQ-10) 200 MG capsule Take 200 mg by mouth daily.    . fluticasone (FLONASE) 50 MCG/ACT nasal spray Place 2 sprays into both nostrils daily.     . furosemide (LASIX) 40 MG tablet TAKE 1 TABLET BY MOUTH EVERY DAY 90 tablet 0  . hydrALAZINE (APRESOLINE) 10 MG tablet 3 tabs po tid 540 tablet 0  . metoprolol tartrate (LOPRESSOR) 100 MG tablet Take 1 tablet (100 mg total) by mouth 2 (two) times daily. 180 tablet 2  . Multiple Vitamins-Minerals (AIRBORNE PO) Take 1 tablet by mouth daily as needed (immune support).     .  Multiple Vitamins-Minerals (CENTRUM SILVER ULTRA WOMENS) TABS Take 1 tablet by mouth every evening.     . potassium chloride SA (KLOR-CON) 20 MEQ tablet Take 1 tablet (20 mEq total) by mouth 2 (two) times daily. 180 tablet 3  . sodium chloride (OCEAN) 0.65 % nasal spray Place 2 sprays into the nose daily. After shower    . Turmeric 500 MG CAPS Take 500 mg by mouth daily.    . Alveda Reasons5 MG TABS tablet TAKE 1 TABLET BY MOUTH EVERY DAY WITH SUPPER 90 tablet 1   No facility-administered medications prior to visit.    Allergies  Allergen Reactions  . Augmentin [Amoxicillin-Pot Clavulanate] Nausea And Vomiting and Other (See Comments)    "projectile vomiting"  Has patient had a PCN reaction causing immediate rash, facial/tongue/throat swelling, SOB or lightheadedness with hypotension:No Has patient had a PCN reaction causing severe rash involving mucus membranes or skin necrosis:No Has patient had a PCN reaction that required hospitalization:No Has patient had a PCN reaction occurring within the last 10 years:Yes If all of the above answers are "NO", then may proceed with Cephalosporin use.     ROS As per HPI  PE: Vitals with BMI 10/10/2019 07/10/2019 06/10/2019  Height 5' 1"  5' 1"  5' 1"   Weight 173 lbs 2 oz 177 lbs 6 oz 176 lbs 6 oz  BMI 32.73 10.93 23.55  Systolic 732 202 542  Diastolic 83 80 80  Pulse 63 60 58     Gen: Alert, well appearing.  Patient is oriented to person, place, time, and situation. AFFECT: pleasant, lucid thought and speech. CV: RRR, occ ectopy/brief irregularity, then resumes long periods of regularity of rhythm. Rate about 75.  No m/r/g. Chest is clear, no wheezing or rales. Normal symmetric air entry throughout both lung fields. No chest wall deformities or tenderness. EXT: no clubbing or cyanosis.  no edema. She is wearing compression stockings.   LABS:    Chemistry      Component Value Date/Time   NA 142 07/26/2019 0910   K 4.4 07/26/2019 0910   CL  106 07/26/2019 0910   CO2 29 07/26/2019 0910   BUN 27 (H) 07/26/2019 0910   CREATININE 1.17 07/26/2019 0910   CREATININE 1.23 (H) 10/23/2017 0938      Component Value Date/Time   CALCIUM 9.5 07/26/2019 0910   ALKPHOS 69 07/26/2019 0910   AST 17 07/26/2019 0910   ALT 12 07/26/2019 0910   BILITOT 0.7 07/26/2019 0910   BILITOT 0.4 10/12/2017 1538     Lab Results  Component Value Date   WBC 5.4 07/26/2019   HGB 12.6 07/26/2019   HCT 38.6 07/26/2019   MCV 89.1 07/26/2019   PLT 189.0 07/26/2019   Lab Results  Component Value Date   TSH 0.68 10/22/2018    IMPRESSION AND PLAN:  Uncontrolled HTN in the context of CRI III, DD, and PAF with chronic anticoagulation. Will cautiously add chlorthalidone 56m qd to her current regimen of 366mhydralazine tid and lopressor 100 mg bid. BMET today.  Monitor bp and HR once a day and return to review with me and repeat BMET in 1 wk.  An After Visit Summary was printed and given to the patient.  FOLLOW UP: Return in about 1 week (around 10/17/2019) for f/u uncontrolled HTN and recheck BMET.  Signed:  PhCrissie SicklesMD           10/10/2019

## 2019-10-11 LAB — BASIC METABOLIC PANEL
BUN: 33 mg/dL — ABNORMAL HIGH (ref 6–23)
CO2: 31 mEq/L (ref 19–32)
Calcium: 9.8 mg/dL (ref 8.4–10.5)
Chloride: 102 mEq/L (ref 96–112)
Creatinine, Ser: 1.17 mg/dL (ref 0.40–1.20)
GFR: 44.21 mL/min — ABNORMAL LOW (ref >60.00)
Glucose, Bld: 103 mg/dL — ABNORMAL HIGH (ref 70–99)
Potassium: 3.9 mEq/L (ref 3.5–5.1)
Sodium: 141 mEq/L (ref 135–145)

## 2019-10-12 NOTE — Progress Notes (Signed)
LVM for CB (lab results)

## 2019-10-16 ENCOUNTER — Other Ambulatory Visit: Payer: Self-pay

## 2019-10-17 ENCOUNTER — Ambulatory Visit (INDEPENDENT_AMBULATORY_CARE_PROVIDER_SITE_OTHER): Payer: Medicare PPO | Admitting: Family Medicine

## 2019-10-17 ENCOUNTER — Encounter: Payer: Self-pay | Admitting: Family Medicine

## 2019-10-17 VITALS — BP 121/74 | HR 61 | Temp 97.7°F | Resp 16 | Ht 61.0 in | Wt 171.0 lb

## 2019-10-17 DIAGNOSIS — I10 Essential (primary) hypertension: Secondary | ICD-10-CM | POA: Diagnosis not present

## 2019-10-17 DIAGNOSIS — N183 Chronic kidney disease, stage 3 unspecified: Secondary | ICD-10-CM | POA: Diagnosis not present

## 2019-10-17 LAB — BASIC METABOLIC PANEL
BUN: 46 mg/dL — ABNORMAL HIGH (ref 6–23)
CO2: 31 mEq/L (ref 19–32)
Calcium: 9.7 mg/dL (ref 8.4–10.5)
Chloride: 101 mEq/L (ref 96–112)
Creatinine, Ser: 1.61 mg/dL — ABNORMAL HIGH (ref 0.40–1.20)
GFR: 30.59 mL/min — ABNORMAL LOW (ref >60.00)
Glucose, Bld: 95 mg/dL (ref 70–99)
Potassium: 4.3 mEq/L (ref 3.5–5.1)
Sodium: 141 mEq/L (ref 135–145)

## 2019-10-17 NOTE — Progress Notes (Signed)
OFFICE VISIT  10/17/2019  CC:  Chief Complaint  Patient presents with  . Follow-up    hypertension, pt is not fasting   HPI:    Patient is a 84 y.o. Caucasian female who presents for 1 wk f/u uncontrolled HTN. A/P as of last visit: "Uncontrolled HTN in the context of CRI IIIb, DD, and PAF with chronic anticoagulation. Will cautiously add chlorthalidone 47m qd to her current regimen of 377mhydralazine tid and lopressor 100 mg bid. BMET today.  Monitor bp and HR once a day and return to review with me and repeat BMET in 1 wk."  INTERIM HX: Feeling well. BP cuff broke 2 d/a but bp checks the 5-6d prior to that were avg 160 syst and avg diast low 80s. Says yesterday she began to feel like the new bp med was "working". Today's bp here today is perfect.   Past Medical History:  Diagnosis Date  . Abnormal EKG approx 2008   Nuclear stress test neg;   . Anxiety    with panic  . CAP (community acquired pneumonia) 08/2017   Hospitalization for CAP/acute diast HF/rapid a-fib  . Cataract    s/p surgery--lens implants  . Chronic diastolic heart failure ([place]  . Chronic renal insufficiency, stage 3 (moderate) 12/04/2012   Renal u/s when in hosp 08/2017 for CAP/CHF showed symmetric kidneys, echogenicity normal, w/out hydronephrosis. Baseline GFR around 40 ml/min as of 10/2018.  . DDD (degenerative disc disease), lumbar   . Diverticulosis 2009  . Fracture of radial shaft, left, closed 11/16/10   fell down flight of stairs  . History of kidney stones   . Hx of adenomatous colonic polyps 2002;2009;2015   surveillance colonoscopy 2009, +polypectomy done-tubular adenoma w/out high grade.  05/2013 tubular adenomas--recall 3 yrs  . Hyperlipidemia   . Hypertension   . Low TSH level 02/18/2016   T3 norm, T4 mildly elevated--suspected sick euthyroid syndrome.  Repeat labs 06/2016: normal.  . Melanoma in situ ([place]   L LL  . Osteoarthritis of left knee    viscosupplementation  injections helpful 2020/21  . Osteopenia    DEXA 08/2010; repeat DEXA 02/2015 worse: fosamax started.  06/2018 Dexa T score -2.4.  2020 maj osteop fx risk = 24%, Hip fx risk 7.3%. Rpt 2 yrs.  . Marland KitchenAF (paroxysmal atrial fibrillation) [place]   when in post-op for ankle surgery; spontaneously converted in hosp, seen by Dr. HiDebara Pickettn consultation--metoprolol rate control + xarelto recommended.  Metop d/c due to hypot.  Plan to cont xarelto 20 mg qd indef due to CHAD-VASc score of 3.  A-fib w/RVR and CHF 07/2017; pt placed on amiodarone and plan for CV, but pt was in sinus rhythm when she went in for her DC CV, so she was sent home.  . Peripheral edema   . Pneumonia 2015   hx with sepsis  . Rheumatic fever     Past Surgical History:  Procedure Laterality Date  . APPENDECTOMY  1966   done during surgery for tubal pregnancy  . CATARACT EXTRACTION W/ INTRAOCULAR LENS IMPLANT  2013   bilat  . COLONOSCOPY W/ POLYPECTOMY  05/2013   +diverticulosis; recall 3 yrs (Dr. PeHenrene Pastor . DEXA  02/2015; 06/2018   T score -2.1 in both femoral necks; FRAX 10 yr risk of major osteoporotic fracture was 21%---fosamax started. 06/2018 T score -2.4.  repeat 1 yr.  . Marland KitchenYE SURGERY    . LEFT HEART CATH AND CORONARY ANGIOGRAPHY N/A 09/01/2017  No angiographically significant CAD.  Upper normal left ventricular filling pressure.  Procedure: LEFT HEART CATH AND CORONARY ANGIOGRAPHY;  Surgeon: Nelva Bush, MD;  Location: [facility];  Service: Cardiovascular;  Laterality: N/A;  . LUMBAR LAMINECTOMY/DECOMPRESSION MICRODISCECTOMY N/A 09/26/2018   Procedure: Decompressive Lumbar Laminectomy L5 S1 FORAMINOTOMY L5 S1  NERVE ROOT BILATERALLY and Microdiscectomy L5-S1 Left;  Surgeon: Latanya Maudlin, MD;  [facility];  Service: Orthopedics;  Laterality: N/A;  173mn  . OPEN REDUCTION INTERNAL FIXATION (ORIF) TIBIA/FIBULA FRACTURE Left 02/18/2016   Procedure: OPEN REDUCTION INTERNAL FIXATION (ORIF) Right ankle trimalleolar  fracture;  Surgeon: JWylene Simmer MD;  [facility]  Service: Orthopedics;  Laterality: Left;  requests 972ms  . ORIF RADIAL FRACTURE  11/18/10   left; s/p slip on slippery floor and fell  . THORACENTESIS  08/2017   diagnostic and therapeutic.  Transudative.  Clx neg.  (+pulm edema/diastolic HF)  . TONSILLECTOMY    . TRANSTHORACIC ECHOCARDIOGRAM  02/18/2016; 08/10/17   LVEF of 55-60%, mild AI and mild MR and normal biatrial size.  07/2017--normal LV function, mild enlarge aortic root, mild/mod TR, bilat atrial enlargement.    Outpatient Medications Prior to Visit  Medication Sig Dispense Refill  . Acetaminophen (TYLENOL 8 HOUR ARTHRITIS PAIN PO) Take by mouth in the morning and at bedtime.    . Marland Kitchenlbuterol (VENTOLIN HFA) 108 (90 Base) MCG/ACT inhaler INHALE 2 PUFFS INTO THE LUNGS EVERY 4 HOURS AS NEEDED FOR WHEEZING OR SHORTNESS OF BREATH 8.5 g 1  . ALPRAZolam (XANAX) 0.5 MG tablet TAKE 1 TABLET BY MOUTH 3 TIMES DAILY AS NEEDED FOR STRESS 90 tablet 0  . Apoaequorin (PREVAGEN PO) Take 1 tablet by mouth daily.    . Marland Kitchentorvastatin (LIPITOR) 40 MG tablet TAKE 1 TABLET BY MOUTH EVERY DAY 90 tablet 0  . Biotin 5000 MCG TABS Take 5,000 mcg by mouth daily.    . Calcium Carbonate (CALCIUM 600 PO) Take 1,200 mg by mouth daily.     . carboxymethylcellulose (REFRESH PLUS) 0.5 % SOLN Place 2 drops into both eyes daily as needed (dry/irritated eyes.).     . Marland Kitchenhlorthalidone (HYGROTON) 25 MG tablet Take 1 tablet (25 mg total) by mouth daily. 30 tablet 0  . Coenzyme Q10 (CVS COQ-10) 200 MG capsule Take 200 mg by mouth daily.    . furosemide (LASIX) 40 MG tablet TAKE 1 TABLET BY MOUTH EVERY DAY 90 tablet 0  . hydrALAZINE (APRESOLINE) 10 MG tablet 3 tabs po tid 540 tablet 0  . metoprolol tartrate (LOPRESSOR) 100 MG tablet Take 1 tablet (100 mg total) by mouth 2 (two) times daily. 180 tablet 2  . Multiple Vitamins-Minerals (CENTRUM SILVER ULTRA WOMENS) TABS Take 1 tablet by mouth every evening.     . potassium  chloride SA (KLOR-CON) 20 MEQ tablet Take 1 tablet (20 mEq total) by mouth 2 (two) times daily. 180 tablet 3  . Turmeric 500 MG CAPS Take 500 mg by mouth daily.    . Alveda Reasons5 MG TABS tablet TAKE 1 TABLET BY MOUTH EVERY DAY WITH SUPPER 90 tablet 1  . fluticasone (FLONASE) 50 MCG/ACT nasal spray Place 2 sprays into both nostrils daily.  (Patient not taking: Reported on 10/17/2019)    . Multiple Vitamins-Minerals (AIRBORNE PO) Take 1 tablet by mouth daily as needed (immune support).  (Patient not taking: Reported on 10/17/2019)    . sodium chloride (OCEAN) 0.65 % nasal spray Place 2 sprays into the nose daily. After shower (Patient not taking:  Reported on 10/17/2019)     No facility-administered medications prior to visit.    Allergies  Allergen Reactions  . Augmentin [Amoxicillin-Pot Clavulanate] Nausea And Vomiting and Other (See Comments)    "projectile vomiting" Has patient had a PCN reaction causing immediate rash, facial/tongue/throat swelling, SOB or lightheadedness with hypotension:No Has patient had a PCN reaction causing severe rash involving mucus membranes or skin necrosis:No Has patient had a PCN reaction that required hospitalization:No Has patient had a PCN reaction occurring within the last 10 years:Yes If all of the above answers are "NO", then may proceed with Cephalosporin use.     ROS As per HPI  PE: Vitals with BMI 10/17/2019 10/10/2019 07/10/2019  Height 5' 1"  5' 1"  5' 1"   Weight 171 lbs 173 lbs 2 oz 177 lbs 6 oz  BMI 32.33 77.82 42.35  Systolic 361 443 154  Diastolic 74 83 80  Pulse 61 63 60     Gen: Alert, well appearing.  Patient is oriented to person, place, time, and situation. AFFECT: pleasant, lucid thought and speech. CV: RRR, no m/r/g.   LUNGS: CTA bilat, nonlabored resps, good aeration in all lung fields. EXT: no clubbing or cyanosis.  1+ bilat LL pitting edema.    LABS:    Chemistry      Component Value Date/Time   NA 141 10/10/2019 1524   K  3.9 10/10/2019 1524   CL 102 10/10/2019 1524   CO2 31 10/10/2019 1524   BUN 33 (H) 10/10/2019 1524   CREATININE 1.17 10/10/2019 1524   CREATININE 1.23 (H) 10/23/2017 0938      Component Value Date/Time   CALCIUM 9.8 10/10/2019 1524   ALKPHOS 69 07/26/2019 0910   AST 17 07/26/2019 0910   ALT 12 07/26/2019 0910   BILITOT 0.7 07/26/2019 0910   BILITOT 0.4 10/12/2017 1538      IMPRESSION AND PLAN:  Uncontrolled HTN: I think we're making some ground. Lets give it another week on current regimen of chlorthalidone 24m qd, hydralazine 360mtid, and lopressor 100 mg bid. She does take 4051murosemide daily for her diastolic dysfunction and she has CRI III---hopefully renal function and K stayed stable. Pt's baseline sCr is about 1.3 (GFR @40  ml/min). BMET repeat today.  Instructions: Check your blood pressure and heart rate every morning and every evening for the next 1 wk and call our office to report these numbers to my nurse.  An After Visit Summary was printed and given to the patient.  FOLLOW UP: Return in about 4 weeks (around 11/14/2019) for annual CPE (fasting).  Signed:  PhiCrissie SicklesD           10/17/2019

## 2019-10-17 NOTE — Patient Instructions (Signed)
Check your blood pressure and heart rate every morning and every evening for the next 1 wk and call our office to report these numbers to my nurse.

## 2019-10-18 ENCOUNTER — Other Ambulatory Visit: Payer: Self-pay | Admitting: Family Medicine

## 2019-10-18 MED ORDER — HYDRALAZINE HCL 50 MG PO TABS: 50.0000 mg | ORAL_TABLET | Freq: Three times a day (TID) | ORAL | 1 refills | Status: DC

## 2019-10-18 NOTE — Addendum Note (Signed)
Addended by: Octaviano Glow on: 10/18/2019 08:33 AM   Modules accepted: Orders

## 2019-11-07 ENCOUNTER — Other Ambulatory Visit: Payer: Self-pay | Admitting: Family Medicine

## 2019-11-08 ENCOUNTER — Telehealth: Payer: Self-pay | Admitting: Family Medicine

## 2019-11-08 ENCOUNTER — Other Ambulatory Visit: Payer: Self-pay | Admitting: Family Medicine

## 2019-11-08 MED ORDER — AMLODIPINE BESYLATE 2.5 MG PO TABS: 2.5000 mg | ORAL_TABLET | Freq: Every day | ORAL | 0 refills | Status: DC

## 2019-11-08 NOTE — Telephone Encounter (Signed)
Tried to call patient twice regarding lab results from 9/30 and obtain most recent BP reading. Patient was contacted 3 times and letter sent to contact our office to further discuss. She failed to follow up but new Rx sent to pharmacy for hydralazine. In result note PCP advised, "Stop chlorthalidone.I will increase her hydralazine to 70m three times per day. Pls eRX hydralazine 529m 1 tab po tid, #90, RF x 1. Lab visit for bmet 1 wk (non-fasting), dx Chronic renal insufficiency stage III. Still keep plan of bp monitoring, drop numbers off with VaLorriane Shirehen she comes in for her lab in 1 wk"  Please advise on recommendations, thanks.

## 2019-11-08 NOTE — Telephone Encounter (Signed)
Patient called answering service this evening and was very upset. (sorry for long message to follow!)  *because she was not reached with lab results after her 9/30 visit with you, she was not aware she was supposed to stop her chlorthalidone. She also was not aware of the hydralazine increase until she went to the pharmacy to pick up another medication and they told her dose was adjusted. She had asked for refill of chlorthalidone not knowing the above info and when it was denied she didn't understand why.  *she didn't see on letter where it requested her to call the office for results (letter sent 10/7) and thought it was just a copy of her bloodwork.   *when she called answering service her pressure was 182/100 and she was worried she was going to "stroke out".   *She only has one phone (mobile) which is the one we have on file. She states her voicemail is working fine. Her son that is listed is an ok back up in case she cannot be reached.   *We talked through your lab result note, she states that she is taking her other listed medications (we reviewed them with doses), and she rechecked pressure: 169/91. She has no headache, dizziness, chest pain, chest pressure. Her pressure even came down ton 150 on the phone. She states usually it is above 140. The lowest pressure she has seen in last couple of weeks was in 130's.   *on chart review, looks like she was previously on amlodipine. I sent this in at 2.68m dose only if pressures are 160+ through rest of weekend. Otherwise encouraged her to await your recommendation. I told her to call answering service back if further concerns.   *I told her you/staff would call her on Monday to set up her repeat bmp and I told her NOT to fast for this. I told her you would make recommendations for blood pressure.

## 2019-11-08 NOTE — Telephone Encounter (Signed)
Patient's refill of chlorthalidone was denied and pharmacy told her that per our office, she was to stop taking it as of 10/18/19. Patient states she has continued to take it but is now out and is worried her blood pressure will go up. Please call patient to advise.

## 2019-11-09 ENCOUNTER — Other Ambulatory Visit: Payer: Self-pay | Admitting: Family Medicine

## 2019-11-09 ENCOUNTER — Other Ambulatory Visit: Payer: Self-pay | Admitting: Internal Medicine

## 2019-11-09 NOTE — Telephone Encounter (Signed)
No problem! Thanks for the response.

## 2019-11-09 NOTE — Telephone Encounter (Signed)
Pls call pt and see how her bp is. The on call MD this weekend had to call and help her b/c pt was upset and confused about her bp meds. I know you have done everything right with this situation and thank you for documenting everything. She should be taking amlodipine 2.61m daily and hydralazine 50 mg three times a day now.-thx

## 2019-11-09 NOTE — Telephone Encounter (Signed)
Attempted to contact pt in regards to her blood pressure and left message to call back on monday

## 2019-11-09 NOTE — Telephone Encounter (Signed)
Thanks very much, Dr. Ethlyn Gallery. We'll get back with her Monday. -Phil.

## 2019-11-10 ENCOUNTER — Encounter: Payer: Self-pay | Admitting: Family Medicine

## 2019-11-11 ENCOUNTER — Other Ambulatory Visit: Payer: Self-pay

## 2019-11-11 NOTE — Telephone Encounter (Signed)
Attempted to contact patient regarding blood pressure and left message to call back.

## 2019-11-12 NOTE — Telephone Encounter (Signed)
Attempted to contact patient regarding blood pressure and left message to call back. Please see message below from [place] regarding blood pressure recommendations.

## 2019-11-13 ENCOUNTER — Other Ambulatory Visit: Payer: Self-pay | Admitting: Family Medicine

## 2019-11-21 ENCOUNTER — Other Ambulatory Visit: Payer: Self-pay | Admitting: Family Medicine

## 2019-12-04 ENCOUNTER — Encounter: Payer: Self-pay | Admitting: Family Medicine

## 2019-12-04 ENCOUNTER — Ambulatory Visit (INDEPENDENT_AMBULATORY_CARE_PROVIDER_SITE_OTHER): Payer: Medicare PPO | Admitting: Family Medicine

## 2019-12-04 ENCOUNTER — Other Ambulatory Visit: Payer: Self-pay

## 2019-12-04 ENCOUNTER — Other Ambulatory Visit: Payer: Self-pay | Admitting: Family Medicine

## 2019-12-04 VITALS — BP 143/77 | HR 69 | Temp 97.6°F | Resp 16 | Ht 61.0 in | Wt 173.6 lb

## 2019-12-04 DIAGNOSIS — E7800 Pure hypercholesterolemia, unspecified: Secondary | ICD-10-CM | POA: Diagnosis not present

## 2019-12-04 DIAGNOSIS — I480 Paroxysmal atrial fibrillation: Secondary | ICD-10-CM

## 2019-12-04 DIAGNOSIS — I10 Essential (primary) hypertension: Secondary | ICD-10-CM

## 2019-12-04 DIAGNOSIS — I1310 Hypertensive heart and chronic kidney disease without heart failure, with stage 1 through stage 4 chronic kidney disease, or unspecified chronic kidney disease: Secondary | ICD-10-CM

## 2019-12-04 DIAGNOSIS — R519 Headache, unspecified: Secondary | ICD-10-CM

## 2019-12-04 DIAGNOSIS — N2889 Other specified disorders of kidney and ureter: Secondary | ICD-10-CM

## 2019-12-04 DIAGNOSIS — N183 Chronic kidney disease, stage 3 unspecified: Secondary | ICD-10-CM | POA: Diagnosis not present

## 2019-12-04 LAB — BASIC METABOLIC PANEL
BUN: 33 mg/dL — ABNORMAL HIGH (ref 6–23)
CO2: 30 mEq/L (ref 19–32)
Calcium: 9.3 mg/dL (ref 8.4–10.5)
Chloride: 107 mEq/L (ref 96–112)
Creatinine, Ser: 1.28 mg/dL — ABNORMAL HIGH (ref 0.40–1.20)
GFR: 38.93 mL/min — ABNORMAL LOW (ref >60.00)
Glucose, Bld: 124 mg/dL — ABNORMAL HIGH (ref 70–99)
Potassium: 4 mEq/L (ref 3.5–5.1)
Sodium: 143 mEq/L (ref 135–145)

## 2019-12-04 MED ORDER — AMLODIPINE BESYLATE 2.5 MG PO TABS: ORAL_TABLET | ORAL | 0 refills | Status: DC

## 2019-12-04 NOTE — Progress Notes (Signed)
OFFICE VISIT  12/04/2019  CC:  Chief Complaint  Patient presents with  . Headache   HPI:    Patient is a 84 y.o. Caucasian female who presents for f/u--"still having headaches". I last saw her about 6 wks ago. A/P as of that visit: "Uncontrolled HTN: I think we're making some ground. Lets give it another week on current regimen of chlorthalidone 102m qd, hydralazine 322mtid, and lopressor 100 mg bid. She does take 4084murosemide daily for her diastolic dysfunction and she has CRI III---hopefully renal function and K stayed stable. Pt's baseline sCr is about 1.3 (GFR @40  ml/min). BMET repeat today."  INTERIM HX: After last visit 6 wks ago her renal function was down so I d/c'd her chlorthalidone and increased her hydralazine to 38m13md and wanted her to get recheck bmet 1wk and drop off her bp readings at that time.  We were unsuccessful in getting in touch with her, unfortunately, so these changes were not made as planned. Then on 11/08/19 Dr. KobeEthlyn GalleryP -[place]) added amlodipine 2.5mg 27m  About 2 wk hx of R forehead, peri-orbital/temoral area, nagging and would come and go until 2 d/a when it completely resolved.  No nausea or vision abnotrmalities. No signif nasal cong or sinus pressure. Home bp's last couple weeks: sometimes syst 180 to 130.  She does not pay attention to diastolic level.  HR consistently >60.  Today she feels well.  ROS: no fevers, no CP, no SOB, no wheezing, no cough, no dizziness, no rashes, no melena/hematochezia.  No polyuria or polydipsia.  No myalgias or arthralgias.  No focal weakness, paresthesias, or tremors.  No acute vision or hearing abnormalities. No n/v/d or abd pain.  Feels some palpitations at night when lying in bed going to sleep but none in daytime.     Past Medical History:  Diagnosis Date  . Abnormal EKG approx 2008   Nuclear stress test neg;   . Anxiety    with panic  . CAP (community acquired pneumonia) 08/2017    Hospitalization for CAP/acute diast HF/rapid a-fib  . Cataract    s/p surgery--lens implants  . Chronic diastolic heart failure (HCC) [email or web address]  . Chronic renal insufficiency, stage 3 (moderate) (HCC) 12/04/2012   Renal u/s when in hosp 08/2017 for CAP/CHF showed symmetric kidneys, echogenicity normal, w/out hydronephrosis. Baseline GFR around 40 ml/min as of 10/2018.  . DDD (degenerative disc disease), lumbar   . Diverticulosis 2009  . Fracture of radial shaft, left, closed 11/16/10   fell down flight of stairs  . History of kidney stones   . Hx of adenomatous colonic polyps 2002;2009;2015   surveillance colonoscopy 2009, +polypectomy done-tubular adenoma w/out high grade.  05/2013 tubular adenomas--recall 3 yrs  . Hyperlipidemia   . Hypertension   . Low TSH level 02/18/2016   T3 norm, T4 mildly elevated--suspected sick euthyroid syndrome.  Repeat labs 06/2016: normal.  . Melanoma in situ (HCC) [place]2020   L LL  . Osteoarthritis of both knees    viscosupplementation injections helpful 2020/21  . Osteopenia    DEXA 08/2010; repeat DEXA 02/2015 worse: fosamax started.  06/2018 Dexa T score -2.4.  2020 maj osteop fx risk = 24%, Hip fx risk 7.3%. Rpt 2 yrs.  . PAFMarland Kitchen(paroxysmal atrial fibrillation) (HCC) [place]2018   when in post-op for ankle surgery; spontaneously converted in hosp, seen by Dr. HiltyDebara Pickettonsultation--metoprolol rate control + xarelto recommended.  Metop d/c due to hypot.  Plan to cont xarelto 20  mg qd indef due to CHAD-VASc score of 3.  A-fib w/RVR and CHF 07/2017; pt placed on amiodarone and plan for CV, but pt was in sinus rhythm when she went in for her DC CV, so she was sent home.  . Peripheral edema   . Pneumonia 2015   hx with sepsis  . Rheumatic fever     Past Surgical History:  Procedure Laterality Date  . APPENDECTOMY  1966   done during surgery for tubal pregnancy  . CATARACT EXTRACTION W/ INTRAOCULAR LENS IMPLANT  2013   bilat  . COLONOSCOPY W/ POLYPECTOMY  05/2013    +diverticulosis; recall 3 yrs (Dr. Henrene Pastor)  . DEXA  02/2015; 06/2018   T score -2.1 in both femoral necks; FRAX 10 yr risk of major osteoporotic fracture was 21%---fosamax started. 06/2018 T score -2.4.  repeat 1 yr.  Marland Kitchen EYE SURGERY    . LEFT HEART CATH AND CORONARY ANGIOGRAPHY N/A 09/01/2017   No angiographically significant CAD.  Upper normal left ventricular filling pressure.  Procedure: LEFT HEART CATH AND CORONARY ANGIOGRAPHY;  Surgeon: Nelva Bush, MD;  Location: [facility];  Service: Cardiovascular;  Laterality: N/A;  . LUMBAR LAMINECTOMY/DECOMPRESSION MICRODISCECTOMY N/A 09/26/2018   Procedure: Decompressive Lumbar Laminectomy L5 S1 FORAMINOTOMY L5 S1  NERVE ROOT BILATERALLY and Microdiscectomy L5-S1 Left;  Surgeon: Latanya Maudlin, MD;  Location: [facility];  Service: Orthopedics;  Laterality: N/A;  18mn  . OPEN REDUCTION INTERNAL FIXATION (ORIF) TIBIA/FIBULA FRACTURE Left 02/18/2016   Procedure: OPEN REDUCTION INTERNAL FIXATION (ORIF) Right ankle trimalleolar fracture;  Surgeon: JWylene Simmer MD;  [place] [facility]  Service: Orthopedics;  Laterality: Left;  requests 937ms  . ORIF RADIAL FRACTURE  11/18/10   left; s/p slip on slippery floor and fell  . THORACENTESIS  08/2017   diagnostic and therapeutic.  Transudative.  Clx neg.  (+pulm edema/diastolic HF)  . TONSILLECTOMY    . TRANSTHORACIC ECHOCARDIOGRAM  02/18/2016; 08/10/17   LVEF of 55-60%, mild AI and mild MR and normal biatrial size.  07/2017--normal LV function, mild enlarge aortic root, mild/mod TR, bilat atrial enlargement.    Outpatient Medications Prior to Visit  Medication Sig Dispense Refill  . Acetaminophen (TYLENOL 8 HOUR ARTHRITIS PAIN PO) Take by mouth in the morning and at bedtime.    . Marland Kitchenlbuterol (VENTOLIN HFA) 108 (90 Base) MCG/ACT inhaler INHALE 2 PUFFS INTO THE LUNGS EVERY 4 HOURS AS NEEDED FOR WHEEZING OR SHORTNESS OF BREATH 8.5 g 1  . ALPRAZolam (XANAX) 0.5 MG tablet TAKE 1 TABLET BY MOUTH 3 TIMES DAILY AS NEEDED  FOR STRESS 90 tablet 0  . Apoaequorin (PREVAGEN PO) Take 1 tablet by mouth daily.    . Marland Kitchentorvastatin (LIPITOR) 40 MG tablet TAKE 1 TABLET BY MOUTH EVERY DAY 90 tablet 0  . Biotin 5000 MCG TABS Take 5,000 mcg by mouth daily.    . Calcium Carbonate (CALCIUM 600 PO) Take 1,200 mg by mouth daily.     . Coenzyme Q10 ([employer] COQ-10) 200 MG capsule Take 200 mg by mouth daily.    . fluticasone (FLONASE) 50 MCG/ACT nasal spray Place 2 sprays into both nostrils daily.     . furosemide (LASIX) 40 MG tablet TAKE 1 TABLET BY MOUTH EVERY DAY 90 tablet 0  . hydrALAZINE (APRESOLINE) 50 MG tablet Take 1 tablet (50 mg total) by mouth 3 (three) times daily. 90 tablet 1  . metoprolol tartrate (LOPRESSOR) 100 MG tablet Take 1 tablet (100 mg total) by mouth 2 (two) times daily.  180 tablet 2  . Multiple Vitamins-Minerals (CENTRUM SILVER ULTRA WOMENS) TABS Take 1 tablet by mouth every evening.     . potassium chloride SA (KLOR-CON) 20 MEQ tablet Take 1 tablet (20 mEq total) by mouth 2 (two) times daily. 180 tablet 3  . sodium chloride (OCEAN) 0.65 % nasal spray Place 2 sprays into the nose daily. After shower    . Turmeric 500 MG CAPS Take 500 mg by mouth daily.    Alveda Reasons 15 MG TABS tablet TAKE 1 TABLET BY MOUTH EVERY DAY WITH SUPPER 90 tablet 1  . carboxymethylcellulose (REFRESH PLUS) 0.5 % SOLN Place 2 drops into both eyes daily as needed (dry/irritated eyes.).  (Patient not taking: Reported on 12/04/2019)    . Multiple Vitamins-Minerals (AIRBORNE PO) Take 1 tablet by mouth daily as needed (immune support).  (Patient not taking: Reported on 10/17/2019)    . amLODipine (NORVASC) 2.5 MG tablet Take 1 tablet (2.5 mg total) by mouth daily. (Patient not taking: Reported on 12/04/2019) 30 tablet 0   No facility-administered medications prior to visit.    Allergies  Allergen Reactions  . Augmentin [Amoxicillin-Pot Clavulanate] Nausea And Vomiting and Other (See Comments)    "projectile vomiting" Has patient had a PCN  reaction causing immediate rash, facial/tongue/throat swelling, SOB or lightheadedness with hypotension:No Has patient had a PCN reaction causing severe rash involving mucus membranes or skin necrosis:No Has patient had a PCN reaction that required hospitalization:No Has patient had a PCN reaction occurring within the last 10 years:Yes If all of the above answers are "NO", then may proceed with Cephalosporin use.     ROS As per HPI  PE: Vitals with BMI 12/04/2019 10/17/2019 10/10/2019  Height 5' 1"  5' 1"  5' 1"   Weight 173 lbs 10 oz 171 lbs 173 lbs 2 oz  BMI 32.82 10.27 25.36  Systolic 644 034 742  Diastolic 77 74 83  Pulse 69 61 63     Gen: Alert, well appearing.  Patient is oriented to person, place, time, and situation. AFFECT: pleasant, lucid thought and speech. CV: RRR with brief runs of ectopy/irregularity every 20 seconds or so---rate remains <90, no m/r/g.   LUNGS: CTA bilat, nonlabored resps, good aeration in all lung fields. EXT: no clubbing or cyanosis.  1+ bilat LL pitting edema.    LABS:    Chemistry      Component Value Date/Time   NA 141 10/17/2019 1200   K 4.3 10/17/2019 1200   CL 101 10/17/2019 1200   CO2 31 10/17/2019 1200   BUN 46 (H) 10/17/2019 1200   CREATININE 1.61 (H) 10/17/2019 1200   CREATININE 1.23 (H) 10/23/2017 0938      Component Value Date/Time   CALCIUM 9.7 10/17/2019 1200   ALKPHOS 69 07/26/2019 0910   AST 17 07/26/2019 0910   ALT 12 07/26/2019 0910   BILITOT 0.7 07/26/2019 0910   BILITOT 0.4 10/12/2017 1538     Lab Results  Component Value Date   WBC 5.4 07/26/2019   HGB 12.6 07/26/2019   HCT 38.6 07/26/2019   MCV 89.1 07/26/2019   PLT 189.0 07/26/2019   Lab Results  Component Value Date   TSH 0.68 10/22/2018   Lab Results  Component Value Date   CHOL 192 09/10/2018   HDL 73.10 09/10/2018   LDLCALC 50 07/27/2017   LDLDIRECT 66.0 09/10/2018   TRIG 220.0 (H) 09/10/2018   CHOLHDL 3 09/10/2018    IMPRESSION AND  PLAN:  1) Headaches: resolved the  last 2d.  No worrisome features. Reassured->obs. Working on bp control.  2) Uncontrolled HTN (systolic). Chlorthalidone d/c'd due to drop in GFR. She has not started the amlodipine rx'd about 3 wks ago. Will have her start the 2.17m amlod TWO tabs qd, continue lopressor 100 mg bid and hydralazine 578mtid. Cont home bp monitoring and review these in 2 wks in office. BMET today.  3) CRI III: avoids NSAIDs. Hydrates fairly well. Chlorthalidone recently d/c'd due to drop in GFR. BMET today.  4) PAF: essentially asymptomatic. Cont eliquis and lopressor.  An After Visit Summary was printed and given to the patient.  FOLLOW UP: No follow-ups on file.  Signed:  PhCrissie SicklesMD           12/04/2019

## 2019-12-04 NOTE — Patient Instructions (Signed)
Take TWO of the 2.41m amlodipine tabs at the same time every day. Continue your hydralazine and metoprolol as you are currently doing.  Check blood pressure and heart rate 1-2 times per day and we'll review these in the office in 2 wks.  Your goal blood pressure is 140/90.

## 2019-12-07 ENCOUNTER — Other Ambulatory Visit: Payer: Self-pay | Admitting: Family Medicine

## 2019-12-09 ENCOUNTER — Other Ambulatory Visit: Payer: Self-pay | Admitting: Family Medicine

## 2019-12-09 NOTE — Progress Notes (Signed)
LVM for pt to call back and placed letter in the mail.

## 2019-12-10 ENCOUNTER — Other Ambulatory Visit: Payer: Self-pay | Admitting: Family Medicine

## 2019-12-10 NOTE — Telephone Encounter (Signed)
Patient states she is out of this medication and does not know why previous refill requests have been denied. She said at her last appt with Dr. Anitra Lauth, they discussed her taking 2 tabs per day. Please send refill asap or call patient to discuss why refill is denied.

## 2019-12-17 ENCOUNTER — Other Ambulatory Visit: Payer: Self-pay | Admitting: Family Medicine

## 2019-12-20 ENCOUNTER — Telehealth: Payer: Self-pay | Admitting: Family Medicine

## 2019-12-20 NOTE — Telephone Encounter (Signed)
Attempted to schedule AWV. Unable to LVM.  Will try at later time.   Called patient to schedule Annual Wellness Visit.  Please schedule with Nurse Health Advisor Caroleen Hamman, RN at [facility] Left message for patient to schedule Annual Wellness Visit.  Please schedule with Nurse Health Advisor Caroleen Hamman, RN at [facility]

## 2019-12-27 ENCOUNTER — Ambulatory Visit: Payer: Medicare PPO | Admitting: Family Medicine

## 2019-12-27 NOTE — Progress Notes (Deleted)
OFFICE VISIT  12/27/2019  CC: No chief complaint on file.   HPI:    Patient is a 84 y.o. Caucasian female who presents for 3 week f/u uncontrolled HTN and CRI III. A/P as of last visit: "1) Headaches: resolved the last 2d.  No worrisome features. Reassured->obs. Working on bp control.  2) Uncontrolled HTN (systolic). Chlorthalidone d/c'd due to drop in GFR. She has not started the amlodipine rx'd about 3 wks ago. Will have her start the 2.8m amlod TWO tabs qd, continue lopressor 100 mg bid and hydralazine 547mtid. Cont home bp monitoring and review these in 2 wks in office. BMET today.  3) CRI III: avoids NSAIDs. Hydrates fairly well. Chlorthalidone recently d/c'd due to drop in GFR. BMET today.  4) PAF: essentially asymptomatic. Cont eliquis and lopressor."  INTERIM HX: ***   Past Medical History:  Diagnosis Date  . Abnormal EKG approx 2008   Nuclear stress test neg;   . Anxiety    with panic  . CAP (community acquired pneumonia) 08/2017   Hospitalization for CAP/acute diast HF/rapid a-fib  . Cataract    s/p surgery--lens implants  . Chronic diastolic heart failure ([place]  . Chronic renal insufficiency, stage 3 (moderate) (HCC) 12/04/2012   Renal u/s when in hosp 08/2017 for CAP/CHF showed symmetric kidneys, echogenicity normal, w/out hydronephrosis. Baseline GFR around 40 ml/min as of 10/2018.  . DDD (degenerative disc disease), lumbar   . Diverticulosis 2009  . Fracture of radial shaft, left, closed 11/16/10   fell down flight of stairs  . History of kidney stones   . Hx of adenomatous colonic polyps 2002;2009;2015   surveillance colonoscopy 2009, +polypectomy done-tubular adenoma w/out high grade.  05/2013 tubular adenomas--recall 3 yrs  . Hyperlipidemia   . Hypertension   . Low TSH level 02/18/2016   T3 norm, T4 mildly elevated--suspected sick euthyroid syndrome.  Repeat labs 06/2016: normal.  . [place] [place]   L LL  .  Osteoarthritis of both knees    viscosupplementation injections helpful 2020/21  . Osteopenia    DEXA 08/2010; repeat DEXA 02/2015 worse: fosamax started.  06/2018 Dexa T score -2.4.  2020 maj osteop fx risk = 24%, Hip fx risk 7.3%. Rpt 2 yrs.  . Marland KitchenAF (paroxysmal atrial fibrillation) (H[facility]02/2018   when in post-op for ankle surgery; spontaneously converted in hosp, seen by Dr. HiDebara Pickettn consultation--metoprolol rate control + xarelto recommended.  Metop d/c due to hypot.  Plan to cont xarelto 20 mg qd indef due to CHAD-VASc score of 3.  A-fib w/RVR and CHF 07/2017; pt placed on amiodarone and plan for CV, but pt was in sinus rhythm when she went in for her DC CV, so she was sent home.  . Peripheral edema   . Pneumonia 2015   hx with sepsis  . Rheumatic fever     Past Surgical History:  Procedure Laterality Date  . APPENDECTOMY  1966   done during surgery for tubal pregnancy  . CATARACT EXTRACTION W/ INTRAOCULAR LENS IMPLANT  2013   bilat  . COLONOSCOPY W/ POLYPECTOMY  05/2013   +diverticulosis; recall 3 yrs (Dr. PeHenrene Pastor . DEXA  02/2015; 06/2018   T score -2.1 in both femoral necks; FRAX 10 yr risk of major osteoporotic fracture was 21%---fosamax started. 06/2018 T score -2.4.  repeat 1 yr.  . Marland KitchenYE SURGERY    . LEFT HEART CATH AND CORONARY ANGIOGRAPHY N/A 09/01/2017   No angiographically significant CAD.  Upper normal left ventricular filling pressure.  Procedure: LEFT HEART CATH AND CORONARY ANGIOGRAPHY;  Surgeon: Nelva Bush, MD;  Location: [facility];  Service: Cardiovascular;  Laterality: N/A;  . LUMBAR LAMINECTOMY/DECOMPRESSION MICRODISCECTOMY N/A 09/26/2018   Procedure: Decompressive Lumbar Laminectomy L5 S1 FORAMINOTOMY L5 S1  NERVE ROOT BILATERALLY and Microdiscectomy L5-S1 Left;  Surgeon: Latanya Maudlin, MD;  Location: [facility];  Service: Orthopedics;  Laterality: N/A;  160mn  . OPEN REDUCTION INTERNAL FIXATION (ORIF) TIBIA/FIBULA FRACTURE Left 02/18/2016   Procedure: OPEN  REDUCTION INTERNAL FIXATION (ORIF) Right ankle trimalleolar fracture;  Surgeon: JWylene Simmer MD;  [place] [facility]  Service: Orthopedics;  Laterality: Left;  requests 915ms  . ORIF RADIAL FRACTURE  11/18/10   left; s/p slip on slippery floor and fell  . THORACENTESIS  08/2017   diagnostic and therapeutic.  Transudative.  Clx neg.  (+pulm edema/diastolic HF)  . TONSILLECTOMY    . TRANSTHORACIC ECHOCARDIOGRAM  02/18/2016; 08/10/17   LVEF of 55-60%, mild AI and mild MR and normal biatrial size.  07/2017--normal LV function, mild enlarge aortic root, mild/mod TR, bilat atrial enlargement.    Outpatient Medications Prior to Visit  Medication Sig Dispense Refill  . Acetaminophen (TYLENOL 8 HOUR ARTHRITIS PAIN PO) Take by mouth in the morning and at bedtime.    . Marland Kitchenlbuterol (VENTOLIN HFA) 108 (90 Base) MCG/ACT inhaler INHALE 2 PUFFS INTO THE LUNGS EVERY 4 HOURS AS NEEDED FOR WHEEZING OR SHORTNESS OF BREATH 8.5 g 1  . ALPRAZolam (XANAX) 0.5 MG tablet TAKE 1 TABLET BY MOUTH 3 TIMES DAILY AS NEEDED FOR STRESS 90 tablet 0  . amLODipine (NORVASC) 2.5 MG tablet TAKE 1 TABLET BY MOUTH DAILY 60 tablet 0  . Apoaequorin (PREVAGEN PO) Take 1 tablet by mouth daily.    . Marland Kitchentorvastatin (LIPITOR) 40 MG tablet TAKE 1 TABLET BY MOUTH EVERY DAY 90 tablet 0  . Biotin 5000 MCG TABS Take 5,000 mcg by mouth daily.    . Calcium Carbonate (CALCIUM 600 PO) Take 1,200 mg by mouth daily.     . carboxymethylcellulose (REFRESH PLUS) 0.5 % SOLN Place 2 drops into both eyes daily as needed (dry/irritated eyes.).  (Patient not taking: Reported on 12/04/2019)    . Coenzyme Q10 (CVS COQ-10) 200 MG capsule Take 200 mg by mouth daily.    . fluticasone (FLONASE) 50 MCG/ACT nasal spray Place 2 sprays into both nostrils daily.     . furosemide (LASIX) 40 MG tablet TAKE 1 TABLET BY MOUTH EVERY DAY 90 tablet 0  . hydrALAZINE (APRESOLINE) 50 MG tablet Take 1 tablet (50 mg total) by mouth 3 (three) times daily. 90 tablet 1  . metoprolol  tartrate (LOPRESSOR) 100 MG tablet Take 1 tablet (100 mg total) by mouth 2 (two) times daily. 180 tablet 2  . Multiple Vitamins-Minerals (AIRBORNE PO) Take 1 tablet by mouth daily as needed (immune support).  (Patient not taking: Reported on 10/17/2019)    . Multiple Vitamins-Minerals (CENTRUM SILVER ULTRA WOMENS) TABS Take 1 tablet by mouth every evening.     . potassium chloride SA (KLOR-CON) 20 MEQ tablet Take 1 tablet (20 mEq total) by mouth 2 (two) times daily. 180 tablet 3  . sodium chloride (OCEAN) 0.65 % nasal spray Place 2 sprays into the nose daily. After shower    . Turmeric 500 MG CAPS Take 500 mg by mouth daily.    . Alveda Reasons5 MG TABS tablet TAKE 1 TABLET BY MOUTH EVERY DAY WITH SUPPER 90 tablet 1  No facility-administered medications prior to visit.    Allergies  Allergen Reactions  . Augmentin [Amoxicillin-Pot Clavulanate] Nausea And Vomiting and Other (See Comments)    "projectile vomiting" Has patient had a PCN reaction causing immediate rash, facial/tongue/throat swelling, SOB or lightheadedness with hypotension:No Has patient had a PCN reaction causing severe rash involving mucus membranes or skin necrosis:No Has patient had a PCN reaction that required hospitalization:No Has patient had a PCN reaction occurring within the last 10 years:Yes If all of the above answers are "NO", then may proceed with Cephalosporin use.     ROS As per HPI  PE: Vitals with BMI 12/04/2019 10/17/2019 10/10/2019  Height 5' 1"  5' 1"  5' 1"   Weight 173 lbs 10 oz 171 lbs 173 lbs 2 oz  BMI 32.82 89.21 19.41  Systolic 740 814 481  Diastolic 77 74 83  Pulse 69 61 63     ***  LABS:    Chemistry      Component Value Date/Time   NA 143 12/04/2019 0906   K 4.0 12/04/2019 0906   CL 107 12/04/2019 0906   CO2 30 12/04/2019 0906   BUN 33 (H) 12/04/2019 0906   CREATININE 1.28 (H) 12/04/2019 0906   CREATININE 1.23 (H) 10/23/2017 0938      Component Value Date/Time   CALCIUM 9.3  12/04/2019 0906   ALKPHOS 69 07/26/2019 0910   AST 17 07/26/2019 0910   ALT 12 07/26/2019 0910   BILITOT 0.7 07/26/2019 0910   BILITOT 0.4 10/12/2017 1538     Lab Results  Component Value Date   WBC 5.4 07/26/2019   HGB 12.6 07/26/2019   HCT 38.6 07/26/2019   MCV 89.1 07/26/2019   PLT 189.0 07/26/2019   Lab Results  Component Value Date   CHOL 192 09/10/2018   HDL 73.10 09/10/2018   LDLCALC 50 07/27/2017   LDLDIRECT 66.0 09/10/2018   TRIG 220.0 (H) 09/10/2018   CHOLHDL 3 09/10/2018   IMPRESSION AND PLAN:  No problem-specific Assessment & Plan notes found for this encounter.  ?FLP ?BMET  An After Visit Summary was printed and given to the patient.  FOLLOW UP: No follow-ups on file.  Signed:  Crissie Sickles, MD           12/27/2019

## 2020-01-01 ENCOUNTER — Other Ambulatory Visit: Payer: Self-pay | Admitting: Family Medicine

## 2020-01-15 ENCOUNTER — Encounter: Payer: Self-pay | Admitting: Family Medicine

## 2020-01-15 ENCOUNTER — Other Ambulatory Visit: Payer: Self-pay

## 2020-01-15 ENCOUNTER — Ambulatory Visit (INDEPENDENT_AMBULATORY_CARE_PROVIDER_SITE_OTHER): Payer: Medicare PPO | Admitting: Family Medicine

## 2020-01-15 VITALS — BP 155/80 | HR 66 | Temp 97.4°F | Resp 16 | Wt 172.8 lb

## 2020-01-15 DIAGNOSIS — I10 Essential (primary) hypertension: Secondary | ICD-10-CM | POA: Diagnosis not present

## 2020-01-15 MED ORDER — AMLODIPINE BESYLATE 5 MG PO TABS: 5.0000 mg | ORAL_TABLET | Freq: Every day | ORAL | 0 refills | Status: DC

## 2020-01-15 NOTE — Patient Instructions (Signed)
Take TWO of the 2.65m amlodipine tabs at the same time every day. Continue your hydralazine and metoprolol as you are currently doing.  Check blood pressure and heart rate 1-2 times per day and we'll review these in the office in 2 wks.  Your goal blood pressure is 140/90

## 2020-01-15 NOTE — Progress Notes (Signed)
OFFICE VISIT  01/15/2020  CC:  Chief Complaint  Patient presents with  . Hypertension    HPI:    Patient is a 84 y.o. Caucasian female who presents for 6 wk f/u uncontrolled HTN. A/P as of last visit: "1) Headaches: resolved the last 2d.  No worrisome features. Reassured->obs. Working on bp control.  2) Uncontrolled HTN (systolic). Chlorthalidone d/c'd due to drop in GFR. She has not started the amlodipine rx'd about 3 wks ago. Will have her start the 2.40m amlod TWO tabs qd, continue lopressor 100 mg bid and hydralazine 566mtid. Cont home bp monitoring and review these in 2 wks in office. BMET today.  3) CRI III: avoids NSAIDs. Hydrates fairly well. Chlorthalidone recently d/c'd due to drop in GFR. BMET today.  4) PAF: essentially asymptomatic. Cont eliquis and lopressor."  INTERIM HX: Renal function was back at her baseline at last visit. Says she is feeling well. Home bp's: Systolics [id number]--she doesn't pay attention to diastolics. HR usually low 60s. Senses palpitations when she lies down to sleep at night sometimes, lasts a few minutes.  No CP, no dizziness, no SOB, no fevers. No change in mild LE edema.    Past Medical History:  Diagnosis Date  . Abnormal EKG approx 2008   Nuclear stress test neg;   . Anxiety    with panic  . CAP (community acquired pneumonia) 08/2017   Hospitalization for CAP/acute diast HF/rapid a-fib  . Cataract    s/p surgery--lens implants  . Chronic diastolic heart failure ([place]  . Chronic renal insufficiency, stage 3 (moderate) (HCC) 12/04/2012   Renal u/s when in hosp 08/2017 for CAP/CHF showed symmetric kidneys, echogenicity normal, w/out hydronephrosis. Baseline GFR around 40 ml/min as of 10/2018.  . DDD (degenerative disc disease), lumbar   . Diverticulosis 2009  . Fracture of radial shaft, left, closed 11/16/10   fell down flight of stairs  . History of kidney stones   . Hx of adenomatous colonic polyps  2002;2009;2015   surveillance colonoscopy 2009, +polypectomy done-tubular adenoma w/out high grade.  05/2013 tubular adenomas--recall 3 yrs  . Hyperlipidemia   . Hypertension   . Low TSH level 02/18/2016   T3 norm, T4 mildly elevated--suspected sick euthyroid syndrome.  Repeat labs 06/2016: normal.  . Melanoma in situ ([facility]06/2020   L LL  . Osteoarthritis of both knees    viscosupplementation injections helpful 2020/21  . Osteopenia    DEXA 08/2010; repeat DEXA 02/2015 worse: fosamax started.  06/2018 Dexa T score -2.4.  2020 maj osteop fx risk = 24%, Hip fx risk 7.3%. Rpt 2 yrs.  . Marland KitchenAF (paroxysmal atrial fibrillation) ([facility]02/2018   when in post-op for ankle surgery; spontaneously converted in hosp, seen by Dr. HiDebara Pickettn consultation--metoprolol rate control + xarelto recommended.  Metop d/c due to hypot.  Plan to cont xarelto 20 mg qd indef due to CHAD-VASc score of 3.  A-fib w/RVR and CHF 07/2017; pt placed on amiodarone and plan for CV, but pt was in sinus rhythm when she went in for her DC CV, so she was sent home.  . Peripheral edema   . Pneumonia 2015   hx with sepsis  . Rheumatic fever     Past Surgical History:  Procedure Laterality Date  . APPENDECTOMY  1966   done during surgery for tubal pregnancy  . CATARACT EXTRACTION W/ INTRAOCULAR LENS IMPLANT  2013   bilat  . COLONOSCOPY W/ POLYPECTOMY  05/2013   +diverticulosis;  recall 3 yrs (Dr. Henrene Pastor)  . DEXA  02/2015; 06/2018   T score -2.1 in both femoral necks; FRAX 10 yr risk of major osteoporotic fracture was 21%---fosamax started. 06/2018 T score -2.4.  repeat 1 yr.  Marland Kitchen EYE SURGERY    . LEFT HEART CATH AND CORONARY ANGIOGRAPHY N/A 09/01/2017   No angiographically significant CAD.  Upper normal left ventricular filling pressure.  Procedure: LEFT HEART CATH AND CORONARY ANGIOGRAPHY;  Surgeon: Nelva Bush, MD;  Location: [facility];  Service: Cardiovascular;  Laterality: N/A;  . LUMBAR LAMINECTOMY/DECOMPRESSION  MICRODISCECTOMY N/A 09/26/2018   Procedure: Decompressive Lumbar Laminectomy L5 S1 FORAMINOTOMY L5 S1  NERVE ROOT BILATERALLY and Microdiscectomy L5-S1 Left;  Surgeon: Latanya Maudlin, MD;  Location: [facility];  Service: Orthopedics;  Laterality: N/A;  125mn  . OPEN REDUCTION INTERNAL FIXATION (ORIF) TIBIA/FIBULA FRACTURE Left 02/18/2016   Procedure: OPEN REDUCTION INTERNAL FIXATION (ORIF) Right ankle trimalleolar fracture;  Surgeon: JWylene Simmer MD;  [facility] [facility]  Service: Orthopedics;  Laterality: Left;  requests 948ms  . ORIF RADIAL FRACTURE  11/18/10   left; s/p slip on slippery floor and fell  . THORACENTESIS  08/2017   diagnostic and therapeutic.  Transudative.  Clx neg.  (+pulm edema/diastolic HF)  . TONSILLECTOMY    . TRANSTHORACIC ECHOCARDIOGRAM  02/18/2016; 08/10/17   LVEF of 55-60%, mild AI and mild MR and normal biatrial size.  07/2017--normal LV function, mild enlarge aortic root, mild/mod TR, bilat atrial enlargement.    Outpatient Medications Prior to Visit  Medication Sig Dispense Refill  . Acetaminophen (TYLENOL 8 HOUR ARTHRITIS PAIN PO) Take by mouth in the morning and at bedtime.    . Marland Kitchenlbuterol (VENTOLIN HFA) 108 (90 Base) MCG/ACT inhaler INHALE 2 PUFFS INTO THE LUNGS EVERY 4 HOURS AS NEEDED FOR WHEEZING OR SHORTNESS OF BREATH 8.5 g 1  . ALPRAZolam (XANAX) 0.5 MG tablet TAKE 1 TABLET BY MOUTH 3 TIMES DAILY AS NEEDED FOR STRESS 90 tablet 0  . Apoaequorin (PREVAGEN PO) Take 1 tablet by mouth daily.    . Marland Kitchentorvastatin (LIPITOR) 40 MG tablet TAKE 1 TABLET BY MOUTH EVERY DAY 90 tablet 0  . Biotin 5000 MCG TABS Take 5,000 mcg by mouth daily.    . Calcium Carbonate (CALCIUM 600 PO) Take 1,200 mg by mouth daily.     . carboxymethylcellulose (REFRESH PLUS) 0.5 % SOLN Place 2 drops into both eyes daily as needed (dry/irritated eyes.).    . Marland Kitchenoenzyme Q10 200 MG capsule Take 200 mg by mouth daily.    . fluticasone (FLONASE) 50 MCG/ACT nasal spray Place 2 sprays into both nostrils daily.      . furosemide (LASIX) 40 MG tablet TAKE 1 TABLET BY MOUTH EVERY DAY 90 tablet 0  . hydrALAZINE (APRESOLINE) 50 MG tablet Take 1 tablet (50 mg total) by mouth 3 (three) times daily. 90 tablet 1  . metoprolol tartrate (LOPRESSOR) 100 MG tablet Take 1 tablet (100 mg total) by mouth 2 (two) times daily. 180 tablet 2  . Multiple Vitamins-Minerals (AIRBORNE PO) Take 1 tablet by mouth daily as needed (immune support).    . Multiple Vitamins-Minerals (CENTRUM SILVER ULTRA WOMENS) TABS Take 1 tablet by mouth every evening.    . potassium chloride SA (KLOR-CON) 20 MEQ tablet Take 1 tablet (20 mEq total) by mouth 2 (two) times daily. 180 tablet 3  . sodium chloride (OCEAN) 0.65 % nasal spray Place 2 sprays into the nose daily. After shower    . Turmeric 500 MG  CAPS Take 500 mg by mouth daily.    Alveda Reasons 15 MG TABS tablet TAKE 1 TABLET BY MOUTH EVERY DAY WITH SUPPER 90 tablet 1  . amLODipine (NORVASC) 2.5 MG tablet TAKE 1 TABLET BY MOUTH DAILY 60 tablet 0   No facility-administered medications prior to visit.    Allergies  Allergen Reactions  . Augmentin [Amoxicillin-Pot Clavulanate] Nausea And Vomiting and Other (See Comments)    "projectile vomiting" Has patient had a PCN reaction causing immediate rash, facial/tongue/throat swelling, SOB or lightheadedness with hypotension:No Has patient had a PCN reaction causing severe rash involving mucus membranes or skin necrosis:No Has patient had a PCN reaction that required hospitalization:No Has patient had a PCN reaction occurring within the last 10 years:Yes If all of the above answers are "NO", then may proceed with Cephalosporin use.     ROS As per HPI  PE: Vitals with BMI 01/15/2020 12/04/2019 10/17/2019  Height - 5' 1"  5' 1"   Weight 172 lbs 13 oz 173 lbs 10 oz 171 lbs  BMI - 27.51 70.01  Systolic 749 449 675  Diastolic 80 77 74  Pulse 66 69 61     Gen: Alert, well appearing.  Patient is oriented to person, place, time, and  situation. AFFECT: pleasant, lucid thought and speech. CV: RRR, with brief runs of ectopy/irregularity every 20 seconds or so, rate remains <90.  No m/r.  LUNGS: CTA bilat, nonlabored resps, good aeration in all lung fields. EXT: no clubbing or cyanosis.  bilat LL 1+ pitting edema (compression hose are on).     LABS:  Lab Results  Component Value Date   TSH 0.68 10/22/2018   Lab Results  Component Value Date   WBC 5.4 07/26/2019   HGB 12.6 07/26/2019   HCT 38.6 07/26/2019   MCV 89.1 07/26/2019   PLT 189.0 07/26/2019   Lab Results  Component Value Date   CREATININE 1.28 (H) 12/04/2019   BUN 33 (H) 12/04/2019   NA 143 12/04/2019   K 4.0 12/04/2019   CL 107 12/04/2019   CO2 30 12/04/2019   Lab Results  Component Value Date   ALT 12 07/26/2019   AST 17 07/26/2019   ALKPHOS 69 07/26/2019   BILITOT 0.7 07/26/2019   Lab Results  Component Value Date   CHOL 192 09/10/2018   Lab Results  Component Value Date   HDL 73.10 09/10/2018   Lab Results  Component Value Date   LDLCALC 50 07/27/2017   Lab Results  Component Value Date   TRIG 220.0 (H) 09/10/2018   Lab Results  Component Value Date   CHOLHDL 3 09/10/2018   IMPRESSION AND PLAN:  1) Uncontrolled HTN: noncompliant with my recommendations to go up on amlodipine last visit. She followed her instructions on pill bottle rather than what I wrote on her d/c sheet last time. She'll try to make this change now.    Instructions: "Take TWO of the 2.66m amlodipine tabs at the same time every day. Continue your hydralazine and metoprolol as you are currently doing. Check blood pressure and heart rate 1-2 times per day and we'll review these in the office in 2 wks. Your goal blood pressure is 140/90"  2) CRI III-->check of renal function was at her baseline at last visit 12/04/19. No recheck needed today.  3) A-fib: she is going in and out of a-fib but is largely asymptomatic. Cont lopressor and eliquis.  An  After Visit Summary was printed and given to the patient.  FOLLOW UP: Return in about 3 weeks (around 02/05/2020) for f/u HTN.  Signed:  Crissie Sickles, MD           01/15/2020

## 2020-01-17 ENCOUNTER — Other Ambulatory Visit: Payer: Self-pay | Admitting: Family Medicine

## 2020-01-22 ENCOUNTER — Other Ambulatory Visit: Payer: Self-pay | Admitting: Family Medicine

## 2020-01-22 ENCOUNTER — Other Ambulatory Visit: Payer: Self-pay | Admitting: Internal Medicine

## 2020-01-23 NOTE — Telephone Encounter (Signed)
84 F 78.4 kg, SCr 1.28 (11/21), LOV [place]  5/21

## 2020-01-23 NOTE — Telephone Encounter (Signed)
Please review for refill. Thanks!  

## 2020-02-05 ENCOUNTER — Ambulatory Visit: Payer: Medicare PPO | Admitting: Family Medicine

## 2020-02-12 ENCOUNTER — Ambulatory Visit: Payer: Medicare PPO | Admitting: Family Medicine

## 2020-02-13 ENCOUNTER — Other Ambulatory Visit: Payer: Self-pay | Admitting: Family Medicine

## 2020-02-17 ENCOUNTER — Ambulatory Visit: Payer: Medicare PPO | Admitting: Family Medicine

## 2020-02-17 NOTE — Progress Notes (Deleted)
OFFICE VISIT  02/17/2020  CC: No chief complaint on file.   HPI:    Patient is a 85 y.o. Caucasian female who presents for 1 mo f/u uncontrolled HTN. A/P as of last visit: "1) Uncontrolled HTN: noncompliant with my recommendations to go up on amlodipine last visit. She followed her instructions on pill bottle rather than what I wrote on her d/c sheet last time. She'll try to make this change now.    Instructions: "Take TWO of the 2.11m amlodipine tabs at the same time every day. Continue your hydralazine and metoprolol as you are currently doing. Check blood pressure and heart rate 1-2 times per day and we'll review these in the office in 2 wks. Your goal blood pressure is 140/90"  2) CRI III-->check of renal function was at her baseline at last visit 12/04/19. No recheck needed today.  3) A-fib: she is going in and out of a-fib but is largely asymptomatic. Cont lopressor and eliquis."  INTERIM HX: ***    Past Medical History:  Diagnosis Date  . Abnormal EKG approx 2008   Nuclear stress test neg;   . Anxiety    with panic  . CAP (community acquired pneumonia) 08/2017   Hospitalization for CAP/acute diast HF/rapid a-fib  . Cataract    s/p surgery--lens implants  . Chronic diastolic heart failure ([place] 2019  . Chronic renal insufficiency, stage 3 (moderate) (HCC) 12/04/2012   Renal u/s when in hosp 08/2017 for CAP/CHF showed symmetric kidneys, echogenicity normal, w/out hydronephrosis. Baseline GFR around 40 ml/min as of 10/2018.  . DDD (degenerative disc disease), lumbar   . Diverticulosis 2009  . Fracture of radial shaft, left, closed 11/16/10   fell down flight of stairs  . History of kidney stones   . Hx of adenomatous colonic polyps 2002;2009;2015   surveillance colonoscopy 2009, +polypectomy done-tubular adenoma w/out high grade.  05/2013 tubular adenomas--recall 3 yrs  . Hyperlipidemia   . Hypertension   . Low TSH level 02/18/2016   T3 norm, T4 mildly  elevated--suspected sick euthyroid syndrome.  Repeat labs 06/2016: normal.  . Melanoma in situ ([facility] 06/2018   L LL  . Osteoarthritis of both knees    viscosupplementation injections helpful 2020/21  . Osteopenia    DEXA 08/2010; repeat DEXA 02/2015 worse: fosamax started.  06/2018 Dexa T score -2.4.  2020 maj osteop fx risk = 24%, Hip fx risk 7.3%. Rpt 2 yrs.  .Marland KitchenPAF (paroxysmal atrial fibrillation) ([place] 02/2016   when in post-op for ankle surgery; spontaneously converted in hosp, seen by Dr. HDebara Pickettin consultation--metoprolol rate control + xarelto recommended.  Metop d/c due to hypot.  Plan to cont xarelto 20 mg qd indef due to CHAD-VASc score of 3.  A-fib w/RVR and CHF 07/2017; pt placed on amiodarone and plan for CV, but pt was in sinus rhythm when she went in for her DC CV, so she was sent home.  . Peripheral edema   . Pneumonia 2015   hx with sepsis  . Rheumatic fever     Past Surgical History:  Procedure Laterality Date  . APPENDECTOMY  1966   done during surgery for tubal pregnancy  . CATARACT EXTRACTION W/ INTRAOCULAR LENS IMPLANT  2013   bilat  . COLONOSCOPY W/ POLYPECTOMY  05/2013   +diverticulosis; recall 3 yrs (Dr. PHenrene Pastor  . DEXA  02/2015; 06/2018   T score -2.1 in both femoral necks; FRAX 10 yr risk of major osteoporotic fracture was 21%---fosamax started. 06/2018 T  score -2.4.  repeat 1 yr.  Marland Kitchen EYE SURGERY    . LEFT HEART CATH AND CORONARY ANGIOGRAPHY N/A 09/01/2017   No angiographically significant CAD.  Upper normal left ventricular filling pressure.  Procedure: LEFT HEART CATH AND CORONARY ANGIOGRAPHY;  Surgeon: Nelva Bush, MD;  Location: [facility];  Service: Cardiovascular;  Laterality: N/A;  . LUMBAR LAMINECTOMY/DECOMPRESSION MICRODISCECTOMY N/A 09/26/2018   Procedure: Decompressive Lumbar Laminectomy L5 S1 FORAMINOTOMY L5 S1  NERVE ROOT BILATERALLY and Microdiscectomy L5-S1 Left;  Surgeon: Latanya Maudlin, MD;  Location: [facility];  Service: Orthopedics;  Laterality:  N/A;  16mn  . OPEN REDUCTION INTERNAL FIXATION (ORIF) TIBIA/FIBULA FRACTURE Left 02/18/2016   Procedure: OPEN REDUCTION INTERNAL FIXATION (ORIF) Right ankle trimalleolar fracture;  Surgeon: JWylene Simmer MD;  [facility] [facility]  Service: Orthopedics;  Laterality: Left;  requests 932ms  . ORIF RADIAL FRACTURE  11/18/10   left; s/p slip on slippery floor and fell  . THORACENTESIS  08/2017   diagnostic and therapeutic.  Transudative.  Clx neg.  (+pulm edema/diastolic HF)  . TONSILLECTOMY    . TRANSTHORACIC ECHOCARDIOGRAM  02/18/2016; 08/10/17   LVEF of 55-60%, mild AI and mild MR and normal biatrial size.  07/2017--normal LV function, mild enlarge aortic root, mild/mod TR, bilat atrial enlargement.    Outpatient Medications Prior to Visit  Medication Sig Dispense Refill  . Acetaminophen (TYLENOL 8 HOUR ARTHRITIS PAIN PO) Take by mouth in the morning and at bedtime.    . Marland Kitchenlbuterol (VENTOLIN HFA) 108 (90 Base) MCG/ACT inhaler INHALE 2 PUFFS INTO THE LUNGS EVERY 4 HOURS AS NEEDED FOR WHEEZING OR SHORTNESS OF BREATH 8.5 g 1  . ALPRAZolam (XANAX) 0.5 MG tablet TAKE 1 TABLET BY MOUTH 3 TIMES DAILY AS NEEDED FOR STRESS 90 tablet 0  . amLODipine (NORVASC) 5 MG tablet Take 1 tablet (5 mg total) by mouth daily. 30 tablet 0  . Apoaequorin (PREVAGEN PO) Take 1 tablet by mouth daily.    . Marland Kitchentorvastatin (LIPITOR) 40 MG tablet TAKE 1 TABLET BY MOUTH EVERY DAY 90 tablet 1  . Biotin 5000 MCG TABS Take 5,000 mcg by mouth daily.    . Calcium Carbonate (CALCIUM 600 PO) Take 1,200 mg by mouth daily.     . carboxymethylcellulose (REFRESH PLUS) 0.5 % SOLN Place 2 drops into both eyes daily as needed (dry/irritated eyes.).    . Marland Kitchenoenzyme Q10 200 MG capsule Take 200 mg by mouth daily.    . fluticasone (FLONASE) 50 MCG/ACT nasal spray Place 2 sprays into both nostrils daily.     . furosemide (LASIX) 40 MG tablet TAKE 1 TABLET BY MOUTH EVERY DAY 90 tablet 1  . hydrALAZINE (APRESOLINE) 50 MG tablet TAKE 1 TABLET BY MOUTH 3  TIMES DAILY 90 tablet 0  . metoprolol tartrate (LOPRESSOR) 100 MG tablet TAKE 1 TABLET BY MOUTH 2 TIMES DAILY 180 tablet 0  . Multiple Vitamins-Minerals (AIRBORNE PO) Take 1 tablet by mouth daily as needed (immune support).    . Multiple Vitamins-Minerals (CENTRUM SILVER ULTRA WOMENS) TABS Take 1 tablet by mouth every evening.    . potassium chloride SA (KLOR-CON) 20 MEQ tablet Take 1 tablet (20 mEq total) by mouth 2 (two) times daily. 180 tablet 3  . sodium chloride (OCEAN) 0.65 % nasal spray Place 2 sprays into the nose daily. After shower    . Turmeric 500 MG CAPS Take 500 mg by mouth daily.    . Alveda Reasons5 MG TABS tablet TAKE 1 TABLET BY MOUTH EVERY  DAY WITH SUPPER 90 tablet 1   No facility-administered medications prior to visit.    Allergies  Allergen Reactions  . Augmentin [Amoxicillin-Pot Clavulanate] Nausea And Vomiting and Other (See Comments)    "projectile vomiting" Has patient had a PCN reaction causing immediate rash, facial/tongue/throat swelling, SOB or lightheadedness with hypotension:No Has patient had a PCN reaction causing severe rash involving mucus membranes or skin necrosis:No Has patient had a PCN reaction that required hospitalization:No Has patient had a PCN reaction occurring within the last 10 years:Yes If all of the above answers are "NO", then may proceed with Cephalosporin use.     ROS As per HPI  PE: Vitals with BMI 01/15/2020 12/04/2019 10/17/2019  Height - 5' 1"  5' 1"   Weight 172 lbs 13 oz 173 lbs 10 oz 171 lbs  BMI - 45.80 99.83  Systolic 382 505 397  Diastolic 80 77 74  Pulse 66 69 61     ***  LABS:    Chemistry      Component Value Date/Time   NA 143 12/04/2019 0906   K 4.0 12/04/2019 0906   CL 107 12/04/2019 0906   CO2 30 12/04/2019 0906   BUN 33 (H) 12/04/2019 0906   CREATININE 1.28 (H) 12/04/2019 0906   CREATININE 1.23 (H) 10/23/2017 0938      Component Value Date/Time   CALCIUM 9.3 12/04/2019 0906   ALKPHOS 69 07/26/2019  0910   AST 17 07/26/2019 0910   ALT 12 07/26/2019 0910   BILITOT 0.7 07/26/2019 0910   BILITOT 0.4 10/12/2017 1538     Lab Results  Component Value Date   WBC 5.4 07/26/2019   HGB 12.6 07/26/2019   HCT 38.6 07/26/2019   MCV 89.1 07/26/2019   PLT 189.0 07/26/2019   Lab Results  Component Value Date   TSH 0.68 10/22/2018   Lab Results  Component Value Date   CHOL 192 09/10/2018   HDL 73.10 09/10/2018   LDLCALC 50 07/27/2017   LDLDIRECT 66.0 09/10/2018   TRIG 220.0 (H) 09/10/2018   CHOLHDL 3 09/10/2018   IMPRESSION AND PLAN:  No problem-specific Assessment & Plan notes found for this encounter.   An After Visit Summary was printed and given to the patient.  FOLLOW UP: No follow-ups on file.  Signed:  Crissie Sickles, MD           02/17/2020

## 2020-02-20 ENCOUNTER — Other Ambulatory Visit: Payer: Self-pay

## 2020-02-20 ENCOUNTER — Encounter: Payer: Self-pay | Admitting: Family Medicine

## 2020-02-20 ENCOUNTER — Ambulatory Visit (INDEPENDENT_AMBULATORY_CARE_PROVIDER_SITE_OTHER): Payer: Medicare PPO | Admitting: Family Medicine

## 2020-02-20 VITALS — BP 115/58 | HR 68 | Temp 97.5°F | Resp 16 | Ht 61.0 in | Wt 175.8 lb

## 2020-02-20 DIAGNOSIS — I480 Paroxysmal atrial fibrillation: Secondary | ICD-10-CM

## 2020-02-20 DIAGNOSIS — I10 Essential (primary) hypertension: Secondary | ICD-10-CM | POA: Diagnosis not present

## 2020-02-20 DIAGNOSIS — N183 Chronic kidney disease, stage 3 unspecified: Secondary | ICD-10-CM | POA: Diagnosis not present

## 2020-02-20 LAB — BASIC METABOLIC PANEL
BUN: 27 mg/dL — ABNORMAL HIGH (ref 6–23)
CO2: 31 mEq/L (ref 19–32)
Calcium: 10.1 mg/dL (ref 8.4–10.5)
Chloride: 102 mEq/L (ref 96–112)
Creatinine, Ser: 1.28 mg/dL — ABNORMAL HIGH (ref 0.40–1.20)
GFR: 38.87 mL/min — ABNORMAL LOW (ref >60.00)
Glucose, Bld: 117 mg/dL — ABNORMAL HIGH (ref 70–99)
Potassium: 3.8 mEq/L (ref 3.5–5.1)
Sodium: 140 mEq/L (ref 135–145)

## 2020-02-20 MED ORDER — AMLODIPINE BESYLATE 5 MG PO TABS: 5.0000 mg | ORAL_TABLET | Freq: Every day | ORAL | 3 refills | Status: DC

## 2020-02-20 NOTE — Progress Notes (Signed)
CC: 1 month f/u HTN  HPI:  Brenda Matthews is a 85 y.o. female who presents to the clinic today for 1 month follow up for HTN.   At last visit, patient non-compliant with HTN medication (amlodipine) secondary to miscommunication. Patient amlodipine increased from 2.5 mg qd to 5 mg qd for uncontrolled HTN. Patient was only taking 2.5 mg after instructed to increase medication and presented with continued uncontrolled HTN. At that time, patient reminded and instructed to take amlodipine 5 mg qd for HTN.  Today, patient reports she has been taking amlodipine 5 mg qd as prescribed since last visit. Patient brought in home daily BP log that reveals average systolic 893 and diastolic ranging 70 - 80 since increasing amlodipine to 5 mg. Patient without light-headedness, dizziness, confusion. Patient also reporting feeling "better overall" since BP has normalized over last month.  Of note, patient with complaints of frequent a-fib episodes when laying down at night. These episodes were largely asymptomatic. Today, patient reports resolution of a-fib episodes when laying down since increasing amlodipine to 5 mg qd.  ROS: as above, PLUS->no fevers, no CP, no SOB, no wheezing, no cough, no dizziness, no HAs, no rashes, no melena/hematochezia.  No polyuria or polydipsia.  No myalgias or arthralgias.  No focal weakness, paresthesias, or tremors.  No acute vision or hearing abnormalities. No n/v/d or abd pain.     PMH:  Past Medical History:  Diagnosis Date  . Abnormal EKG approx 2008   Nuclear stress test neg;   . Anxiety    with panic  . CAP (community acquired pneumonia) 08/2017   Hospitalization for CAP/acute diast HF/rapid a-fib  . Cataract    s/p surgery--lens implants  . Chronic diastolic heart failure ([place]) 2019  . Chronic renal insufficiency, stage 3 (moderate) (HCC) 12/04/2012   Renal u/s when in hosp 08/2017 for CAP/CHF showed symmetric kidneys, echogenicity normal, w/out hydronephrosis.  Baseline GFR around 40 ml/min as of 10/2018.  . DDD (degenerative disc disease), lumbar   . Diverticulosis 2009  . Fracture of radial shaft, left, closed 11/16/10   fell down flight of stairs  . History of kidney stones   . Hx of adenomatous colonic polyps 2002;2009;2015   surveillance colonoscopy 2009, +polypectomy done-tubular adenoma w/out high grade.  05/2013 tubular adenomas--recall 3 yrs  . Hyperlipidemia   . Hypertension   . Low TSH level 02/18/2016   T3 norm, T4 mildly elevated--suspected sick euthyroid syndrome.  Repeat labs 06/2016: normal.  . Melanoma in situ ([place]) 06/2018   L LL  . Osteoarthritis of both knees    viscosupplementation injections helpful 2020/21  . Osteopenia    DEXA 08/2010; repeat DEXA 02/2015 worse: fosamax started.  06/2018 Dexa T score -2.4.  2020 maj osteop fx risk = 24%, Hip fx risk 7.3%. Rpt 2 yrs.  Marland Kitchen PAF (paroxysmal atrial fibrillation) ([place]) 02/2016   when in post-op for ankle surgery; spontaneously converted in hosp, seen by Dr. Debara Pickett in consultation--metoprolol rate control + xarelto recommended.  Metop d/c due to hypot.  Plan to cont xarelto 20 mg qd indef due to CHAD-VASc score of 3.  A-fib w/RVR and CHF 07/2017; pt placed on amiodarone and plan for CV, but pt was in sinus rhythm when she went in for her DC CV, so she was sent home.  . Peripheral edema   . Pneumonia 2015   hx with sepsis  . Rheumatic fever     M/A: Current Outpatient Medications on File Prior to  Visit  Medication Sig Dispense Refill  . Acetaminophen (TYLENOL 8 HOUR ARTHRITIS PAIN PO) Take by mouth in the morning and at bedtime.    Marland Kitchen albuterol (VENTOLIN HFA) 108 (90 Base) MCG/ACT inhaler INHALE 2 PUFFS INTO THE LUNGS EVERY 4 HOURS AS NEEDED FOR WHEEZING OR SHORTNESS OF BREATH 8.5 g 1  . ALPRAZolam (XANAX) 0.5 MG tablet TAKE 1 TABLET BY MOUTH 3 TIMES DAILY AS NEEDED FOR STRESS 90 tablet 0  . Apoaequorin (PREVAGEN PO) Take 1 tablet by mouth daily.    Marland Kitchen atorvastatin (LIPITOR) 40 MG  tablet TAKE 1 TABLET BY MOUTH EVERY DAY 90 tablet 1  . Biotin 5000 MCG TABS Take 5,000 mcg by mouth daily.    . Calcium Carbonate (CALCIUM 600 PO) Take 1,200 mg by mouth daily.     . carboxymethylcellulose (REFRESH PLUS) 0.5 % SOLN Place 2 drops into both eyes daily as needed (dry/irritated eyes.).    Marland Kitchen Coenzyme Q10 200 MG capsule Take 200 mg by mouth daily.    . fluticasone (FLONASE) 50 MCG/ACT nasal spray Place 2 sprays into both nostrils daily.     . furosemide (LASIX) 40 MG tablet TAKE 1 TABLET BY MOUTH EVERY DAY 90 tablet 1  . hydrALAZINE (APRESOLINE) 50 MG tablet TAKE 1 TABLET BY MOUTH 3 TIMES DAILY 90 tablet 0  . metoprolol tartrate (LOPRESSOR) 100 MG tablet TAKE 1 TABLET BY MOUTH 2 TIMES DAILY 180 tablet 0  . Multiple Vitamins-Minerals (AIRBORNE PO) Take 1 tablet by mouth daily as needed (immune support).    . Multiple Vitamins-Minerals (CENTRUM SILVER ULTRA WOMENS) TABS Take 1 tablet by mouth every evening.    . potassium chloride SA (KLOR-CON) 20 MEQ tablet Take 1 tablet (20 mEq total) by mouth 2 (two) times daily. 180 tablet 3  . sodium chloride (OCEAN) 0.65 % nasal spray Place 2 sprays into the nose daily. After shower    . Turmeric 500 MG CAPS Take 500 mg by mouth daily.    Alveda Reasons 15 MG TABS tablet TAKE 1 TABLET BY MOUTH EVERY DAY WITH SUPPER 90 tablet 1   No current facility-administered medications on file prior to visit.   Allergies  Allergen Reactions  . Augmentin [Amoxicillin-Pot Clavulanate] Nausea And Vomiting and Other (See Comments)    "projectile vomiting" Has patient had a PCN reaction causing immediate rash, facial/tongue/throat swelling, SOB or lightheadedness with hypotension:No Has patient had a PCN reaction causing severe rash involving mucus membranes or skin necrosis:No Has patient had a PCN reaction that required hospitalization:No Has patient had a PCN reaction occurring within the last 10 years:Yes If all of the above answers are "NO", then may proceed  with Cephalosporin use.     FH:  Family History  Problem Relation Age of Onset  . Heart disease Mother   . Heart disease Father   . Hypertension Brother   . Diabetes Sister   . Colon cancer Neg Hx   . Pancreatic cancer Neg Hx   . Rectal cancer Neg Hx   . Stomach cancer Neg Hx     SH: Social History   Socioeconomic History  . Marital status: Widowed    Spouse name: Not on file  . Number of children: Not on file  . Years of education: Not on file  . Highest education level: Not on file  Occupational History  . Not on file  Tobacco Use  . Smoking status: Never Smoker  . Smokeless tobacco: Never Used  Vaping Use  .  Vaping Use: Never used  Substance and Sexual Activity  . Alcohol use: Yes    Comment: rarely  . Drug use: No  . Sexual activity: Not on file  Other Topics Concern  . Not on file  Social History Narrative   Widow, 2 sons.   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   No tobacco.  Rare alcohol.   No drugs.  Exercise: 4 times per week, about 71m.   Social Determinants of Health   Financial Resource Strain: Not on file  Food Insecurity: Not on file  Transportation Needs: Not on file  Physical Activity: Not on file  Stress: Not on file  Social Connections: Not on file    ROS: Review of Systems  Cardiovascular: Positive for leg swelling (limited with compression stockings). Negative for chest pain.  Neurological: Negative for dizziness and headaches.    PE: Vitals with BMI 02/20/2020 01/15/2020 12/04/2019  Height 5' 1"  - 5' 1"   Weight 175 lbs 13 oz 172 lbs 13 oz 173 lbs 10 oz  BMI 356.97- 394.80 Systolic [id number] Diastolic 58 80 77  Pulse 68 66 69    Physical Exam Constitutional:      General: She is not in acute distress.    Appearance: Normal appearance.  HENT:     Head: Normocephalic and atraumatic.  Cardiovascular:     Rate and Rhythm: Normal rate and regular rhythm.     Pulses: Normal pulses.     Heart sounds: Normal heart sounds. No  murmur heard. No friction rub. No gallop.   Pulmonary:     Effort: Pulmonary effort is normal.     Breath sounds: Normal breath sounds.  Musculoskeletal:        General: No swelling.     Right lower leg: No edema.     Left lower leg: No edema.     Comments: No LE edema, patient wearing compression stockings bilaterally.  Neurological:     Mental Status: She is alert.     Labs: Recent Results (from the past 2160 hour(s))  Basic metabolic panel     Status: Abnormal   Collection Time: 12/04/19  9:06 AM  Result Value Ref Range   Sodium 143 135 - 145 mEq/L   Potassium 4.0 3.5 - 5.1 mEq/L   Chloride 107 96 - 112 mEq/L   CO2 30 19 - 32 mEq/L   Glucose, Bld 124 (H) 70 - 99 mg/dL   BUN 33 (H) 6 - 23 mg/dL   Creatinine, Ser 1.28 (H) 0.40 - 1.20 mg/dL   GFR 38.93 (L) >60.00 mL/min    Comment: Calculated using the CKD-EPI Creatinine Equation (2021)   Calcium 9.3 8.4 - 10.5 mg/dL     A/P: In summary, Brenda RHODYis a 85y.o. year old female who presents to the clinic today for 1 month follow up for HTN. Physical exam, unremarkable.  1) HTN: well controlled. At last visit, poor compliance secondary to miscommunication as patient was taking half of dose of amlodipine as instructed to take. Patient now adherent and has been taking medication as prescribed since last visit. Home BP monitoring reveals excellent BP control since taking prescribed dose of amlodipine. Home BPs averaging 1707systolic and 70 - 80 diastolic. Continue current management. - Continue Amlodipine 5 mg qd, lopressor 1056mbid, and hydralazine 5011mid.  2) CRI III--> patient hydrating and avoiding NSAIDs.  She does take 68m3msix qd for LE swelling. - Check lytes/cr today.  3) A-fib: At last visit patient reported going in and out of a-fib but largely asymptomatic. Today, patient reports resolution of a-fib since increasing dose of amlodipine. - Continue lopressor and eliquis.   Lab Orders     Basic metabolic  panel   Follow Up:  3 months routine chronic illness f/u  Signed: Nanetta Batty, MS3  I personally was present during the history, physical exam, and medical decision-making activities of this service and have verified that the service and findings are accurately documented in the student's note. Signed:  Crissie Sickles, MD           02/20/2020

## 2020-02-20 NOTE — Progress Notes (Signed)
See student note from this date. Signed:  Crissie Sickles, MD           02/20/2020

## 2020-02-28 DIAGNOSIS — M170 Bilateral primary osteoarthritis of knee: Secondary | ICD-10-CM | POA: Diagnosis not present

## 2020-03-10 ENCOUNTER — Other Ambulatory Visit: Payer: Self-pay | Admitting: Family Medicine

## 2020-03-23 ENCOUNTER — Other Ambulatory Visit: Payer: Self-pay | Admitting: Family Medicine

## 2020-04-08 ENCOUNTER — Other Ambulatory Visit: Payer: Self-pay | Admitting: Internal Medicine

## 2020-04-25 ENCOUNTER — Other Ambulatory Visit: Payer: Self-pay | Admitting: Family Medicine

## 2020-05-21 ENCOUNTER — Other Ambulatory Visit: Payer: Self-pay

## 2020-05-21 ENCOUNTER — Ambulatory Visit (INDEPENDENT_AMBULATORY_CARE_PROVIDER_SITE_OTHER): Payer: Medicare PPO | Admitting: Family Medicine

## 2020-05-21 ENCOUNTER — Encounter: Payer: Self-pay | Admitting: Family Medicine

## 2020-05-21 VITALS — BP 122/71 | HR 64 | Temp 97.8°F | Resp 16 | Ht 61.0 in | Wt 173.2 lb

## 2020-05-21 DIAGNOSIS — Z79899 Other long term (current) drug therapy: Secondary | ICD-10-CM

## 2020-05-21 DIAGNOSIS — I10 Essential (primary) hypertension: Secondary | ICD-10-CM | POA: Diagnosis not present

## 2020-05-21 DIAGNOSIS — N2889 Other specified disorders of kidney and ureter: Secondary | ICD-10-CM

## 2020-05-21 DIAGNOSIS — E7800 Pure hypercholesterolemia, unspecified: Secondary | ICD-10-CM | POA: Diagnosis not present

## 2020-05-21 DIAGNOSIS — F411 Generalized anxiety disorder: Secondary | ICD-10-CM | POA: Diagnosis not present

## 2020-05-21 DIAGNOSIS — N1831 Chronic kidney disease, stage 3a: Secondary | ICD-10-CM

## 2020-05-21 LAB — BASIC METABOLIC PANEL
BUN: 31 mg/dL — ABNORMAL HIGH (ref 6–23)
CO2: 28 mEq/L (ref 19–32)
Calcium: 10 mg/dL (ref 8.4–10.5)
Chloride: 105 mEq/L (ref 96–112)
Creatinine, Ser: 1.19 mg/dL (ref 0.40–1.20)
GFR: 42.35 mL/min — ABNORMAL LOW (ref >60.00)
Glucose, Bld: 108 mg/dL — ABNORMAL HIGH (ref 70–99)
Potassium: 4.2 mEq/L (ref 3.5–5.1)
Sodium: 141 mEq/L (ref 135–145)

## 2020-05-21 NOTE — Progress Notes (Signed)
OFFICE VISIT  05/21/2020  CC:  Chief Complaint  Patient presents with  . Follow-up    RCI; HTN   HPI:    Patient is a 85 y.o. Caucasian female who presents for 3 mo f/u HTN, HLD, CRI III, anxiety with high risk med use. She is feeling well, no complaints.  HTN: daily home bps consistently 120s/70s, HR 60s. Doing daily stair-stepper exercises.  CRI: avoids NSAIDs. Hydrates decently but admits she can do better.  Hx DD and LE edema: takes lasix 37m qd.  She has been using alprazolam 0.543mtid prn anxiety and anxiety-related insomnia, no hx of adverse effect. Lately has almost exclusively been taking this at night.   PMP AWARE reviewed today: most recent rx for Alprazolam 0.40m89mas filled 06/19/19, # [phone],78x by me. No red flags.   Past Medical History:  Diagnosis Date  . Abnormal EKG approx 2008   Nuclear stress test neg;   . Anxiety    with panic  . CAP (community acquired pneumonia) 08/2017   Hospitalization for CAP/acute diast HF/rapid a-fib  . Cataract    s/p surgery--lens implants  . [place]  . Chronic renal insufficiency, stage 3 (moderate) (HCC) 12/04/2012   Renal u/s when in hosp 08/2017 for CAP/CHF showed symmetric kidneys, echogenicity normal, w/out hydronephrosis. Baseline GFR around 40 ml/min as of 10/2018.  . DDD (degenerative disc disease), lumbar   . Diverticulosis 2009  . Fracture of radial shaft, left, closed 11/16/10   fell down flight of stairs  . History of kidney stones   . Hx of adenomatous colonic polyps 2002;2009;2015   surveillance colonoscopy 2009, +polypectomy done-tubular adenoma w/out high grade.  05/2013 tubular adenomas--recall 3 yrs  . Hyperlipidemia   . Hypertension   . Low TSH level 02/18/2016   T3 norm, T4 mildly elevated--suspected sick euthyroid syndrome.  Repeat labs 06/2016: normal.  . Melanoma in situ [place]   L LL  . Osteoarthritis of both knees    viscosupplementation injections helpful  2020/21  . Osteopenia    DEXA 08/2010; repeat DEXA 02/2015 worse: fosamax started.  06/2018 Dexa T score -2.4.  2020 maj osteop fx risk = 24%, Hip fx risk 7.3%. Rpt 2 yrs.  . PMarland KitchenF (paroxysmal atrial fibrillation) [place]   when in post-op for ankle surgery; spontaneously converted in hosp, seen by Dr. HilDebara Pickett consultation--metoprolol rate control + xarelto recommended.  Metop d/c due to hypot.  Plan to cont xarelto 20 mg qd indef due to CHAD-VASc score of 3.  A-fib w/RVR and CHF 07/2017; pt placed on amiodarone and plan for CV, but pt was in sinus rhythm when she went in for her DC CV, so she was sent home.  . Peripheral edema   . Pneumonia 2015   hx with sepsis  . Rheumatic fever     Past Surgical History:  Procedure Laterality Date  . APPENDECTOMY  1966   done during surgery for tubal pregnancy  . CATARACT EXTRACTION W/ INTRAOCULAR LENS IMPLANT  2013   bilat  . COLONOSCOPY W/ POLYPECTOMY  05/2013   +diverticulosis; recall 3 yrs (Dr. PerHenrene Pastor. DEXA  02/2015; 06/2018   T score -2.1 in both femoral necks; FRAX 10 yr risk of major osteoporotic fracture was 21%---fosamax started. 06/2018 T score -2.4.  repeat 1 yr.  . EMarland KitchenE SURGERY    . LEFT HEART CATH AND CORONARY ANGIOGRAPHY N/A 09/01/2017   No angiographically significant CAD.  Upper normal  left ventricular filling pressure.  Procedure: LEFT HEART CATH AND CORONARY ANGIOGRAPHY;  Surgeon: Nelva Bush, MD;  Location: [facility];  Service: Cardiovascular;  Laterality: N/A;  . LUMBAR LAMINECTOMY/DECOMPRESSION MICRODISCECTOMY N/A 09/26/2018   Procedure: Decompressive Lumbar Laminectomy L5 S1 FORAMINOTOMY L5 S1  NERVE ROOT BILATERALLY and Microdiscectomy L5-S1 Left;  Surgeon: Latanya Maudlin, MD;  Location: [facility];  Service: Orthopedics;  Laterality: N/A;  178mn  . OPEN REDUCTION INTERNAL FIXATION (ORIF) TIBIA/FIBULA FRACTURE Left 02/18/2016   Procedure: OPEN REDUCTION INTERNAL FIXATION (ORIF) Right ankle trimalleolar fracture;  Surgeon:  JWylene Simmer MD;  [place] [facility]  Service: Orthopedics;  Laterality: Left;  requests 942ms  . ORIF RADIAL FRACTURE  11/18/10   left; s/p slip on slippery floor and fell  . THORACENTESIS  08/2017   diagnostic and therapeutic.  Transudative.  Clx neg.  (+pulm edema/diastolic HF)  . TONSILLECTOMY    . TRANSTHORACIC ECHOCARDIOGRAM  02/18/2016; 08/10/17   LVEF of 55-60%, mild AI and mild MR and normal biatrial size.  07/2017--normal LV function, mild enlarge aortic root, mild/mod TR, bilat atrial enlargement.    Outpatient Medications Prior to Visit  Medication Sig Dispense Refill  . Acetaminophen (TYLENOL 8 HOUR ARTHRITIS PAIN PO) Take by mouth in the morning and at bedtime.    . Marland Kitchenlbuterol (VENTOLIN HFA) 108 (90 Base) MCG/ACT inhaler INHALE 2 PUFFS INTO THE LUNGS EVERY 4 HOURS NEEDED FOR FOR WHEEZING OR SHORTNESS OF BREATH] 18 g 1  . ALPRAZolam (XANAX) 0.5 MG tablet TAKE 1 TABLET BY MOUTH 3 TIMES DAILY AS NEEDED FOR STRESS 90 tablet 0  . amLODipine (NORVASC) 5 MG tablet Take 1 tablet (5 mg total) by mouth daily. 90 tablet 3  . Apoaequorin (PREVAGEN PO) Take 1 tablet by mouth daily.    . Marland Kitchentorvastatin (LIPITOR) 40 MG tablet TAKE 1 TABLET BY MOUTH EVERY DAY 90 tablet 1  . Biotin 5000 MCG TABS Take 5,000 mcg by mouth daily.    . Calcium Carbonate (CALCIUM 600 PO) Take 1,200 mg by mouth daily.     . Coenzyme Q10 200 MG capsule Take 200 mg by mouth daily.    . fluticasone (FLONASE) 50 MCG/ACT nasal spray Place 2 sprays into both nostrils daily.     . furosemide (LASIX) 40 MG tablet TAKE 1 TABLET BY MOUTH EVERY DAY 90 tablet 1  . hydrALAZINE (APRESOLINE) 50 MG tablet TAKE 1 TABLET BY MOUTH 3 TIMES DAILY 90 tablet 1  . metoprolol tartrate (LOPRESSOR) 100 MG tablet TAKE 1 TABLET BY MOUTH 2 TIMES DAILY 180 tablet 0  . Multiple Vitamins-Minerals (AIRBORNE PO) Take 1 tablet by mouth daily as needed (immune support).    . Multiple Vitamins-Minerals (CENTRUM SILVER ULTRA WOMENS) TABS Take 1 tablet by mouth  every evening.    . potassium chloride SA (KLOR-CON) 20 MEQ tablet Take 1 tablet (20 mEq total) by mouth 2 (two) times daily. 180 tablet 3  . Turmeric 500 MG CAPS Take 500 mg by mouth daily.    . Alveda Reasons5 MG TABS tablet TAKE 1 TABLET BY MOUTH EVERY DAY WITH SUPPER 90 tablet 1  . carboxymethylcellulose (REFRESH PLUS) 0.5 % SOLN Place 2 drops into both eyes daily as needed (dry/irritated eyes.).    . Marland Kitchenodium chloride (OCEAN) 0.65 % nasal spray Place 2 sprays into the nose daily. After shower (Patient not taking: Reported on 05/21/2020)     No facility-administered medications prior to visit.    Allergies  Allergen Reactions  . Augmentin [Amoxicillin-Pot  Clavulanate] Nausea And Vomiting and Other (See Comments)    "projectile vomiting" Has patient had a PCN reaction causing immediate rash, facial/tongue/throat swelling, SOB or lightheadedness with hypotension:No Has patient had a PCN reaction causing severe rash involving mucus membranes or skin necrosis:No Has patient had a PCN reaction that required hospitalization:No Has patient had a PCN reaction occurring within the last 10 years:Yes If all of the above answers are "NO", then may proceed with Cephalosporin use.     ROS As per HPI  PE: Vitals with BMI 05/21/2020 02/20/2020 01/15/2020  Height 5' 1"  5' 1"  -  Weight 173 lbs 3 oz 175 lbs 13 oz 172 lbs 13 oz  BMI 16.38 45.36 -  Systolic 468 032 122  Diastolic 71 58 80  Pulse 64 68 66     Gen: Alert, well appearing.  Patient is oriented to person, place, time, and situation. AFFECT: pleasant, lucid thought and speech. CV: RRR, no m/r/g.   LUNGS: CTA bilat, nonlabored resps, good aeration in all lung fields. EXT: no clubbing or cyanosis.  She has compression stockings on.  No pitting edema.    LABS:  Lab Results  Component Value Date   TSH 0.68 10/22/2018   Lab Results  Component Value Date   WBC 5.4 07/26/2019   HGB 12.6 07/26/2019   HCT 38.6 07/26/2019   MCV 89.1  07/26/2019   PLT 189.0 07/26/2019   Lab Results  Component Value Date   CREATININE 1.28 (H) 02/20/2020   BUN 27 (H) 02/20/2020   NA 140 02/20/2020   K 3.8 02/20/2020   CL 102 02/20/2020   CO2 31 02/20/2020   Lab Results  Component Value Date   ALT 12 07/26/2019   AST 17 07/26/2019   ALKPHOS 69 07/26/2019   BILITOT 0.7 07/26/2019   Lab Results  Component Value Date   CHOL 192 09/10/2018   Lab Results  Component Value Date   HDL 73.10 09/10/2018   Lab Results  Component Value Date   LDLCALC 50 07/27/2017   Lab Results  Component Value Date   TRIG 220.0 (H) 09/10/2018   Lab Results  Component Value Date   CHOLHDL 3 09/10/2018   IMPRESSION AND PLAN:  1) HTN: well controlled on hydralazine 64m tid, amlodipine 576mqd, and lopressor 10065mid. bmet today.  2) HLD: tolerating atorva 33m55m. LDL was 50 back in 2019.  Trigs 220 about 9 mo ago. She is not fasting today. Plan flp and hepatic panel at next f/u in 3 mo.  3) CRI IIIa: avoids NSAIDs. She'll try to focus more on drinking adequate amounts of clear fluids. Of note, she takes lasix 33mg51mand 20 mEQ potassium supp bid. Lytes/cr today.  4) GAD +anxiety-related insomnia: doing well on alprazolam 0.5mg b39mprn. CSC UTD. She does not need new rx for this med today.  An After Visit Summary was printed and given to the patient.  FOLLOW UP: Return in about 3 months (around 08/21/2020) for annual CPE (fasting). 3 mo fasting cpe  Signed:  Phil MCrissie Sickles         05/21/2020

## 2020-05-27 ENCOUNTER — Other Ambulatory Visit: Payer: Self-pay | Admitting: Family Medicine

## 2020-05-27 IMAGING — US US RENAL
1 series · 14 of 25 positions shown · non-contrast
Comparison: None.

CLINICAL DATA: Initial evaluation for acute renal injury.

EXAM:
RENAL / URINARY TRACT ULTRASOUND COMPLETE

[Series 1: us renal · 0.25mm/px · 14 of 35 slices shown]
[im 1/35]
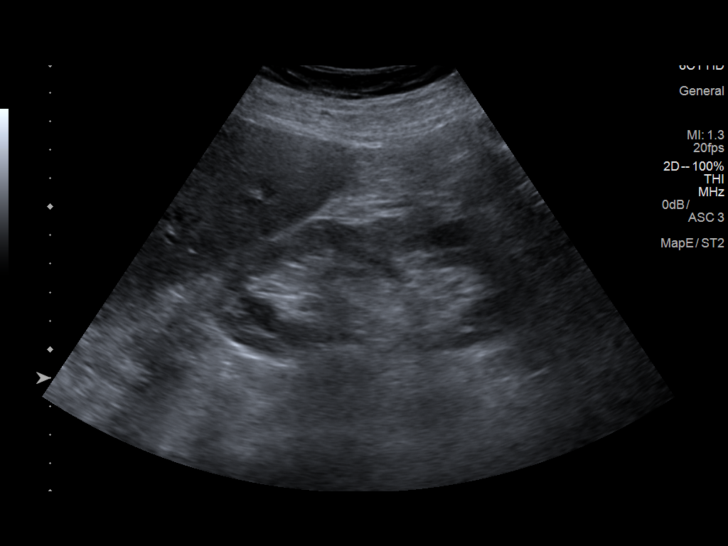
[im 3/35]
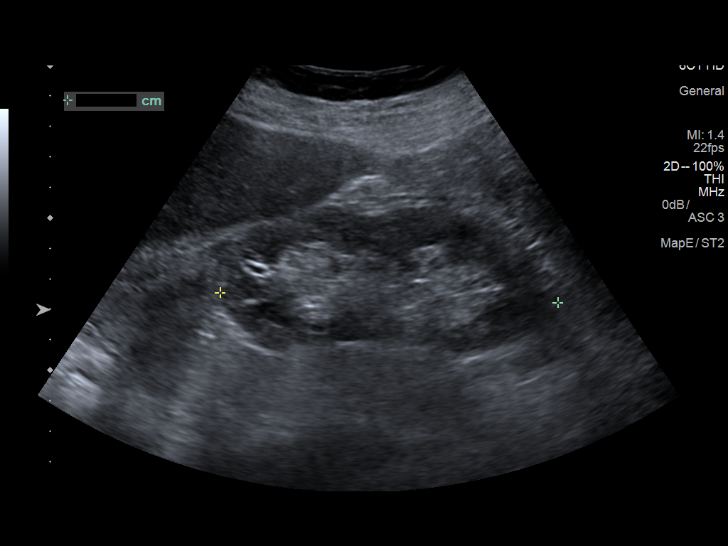
[im 6/35]
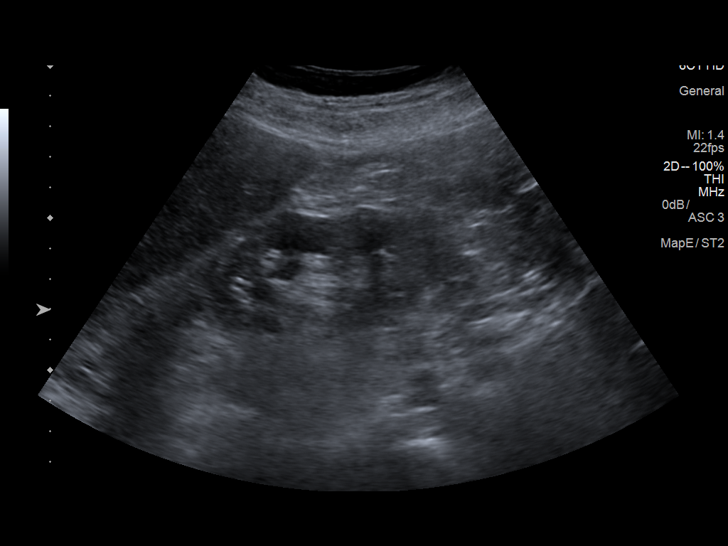
[im 9/35]
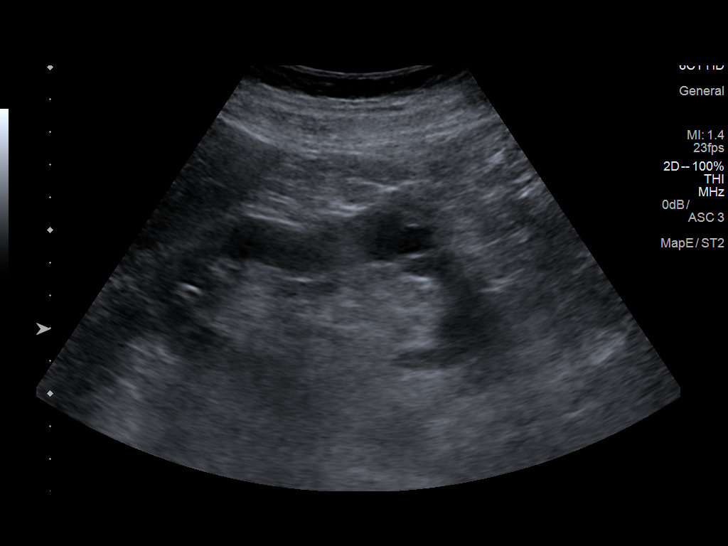
[im 12/35]
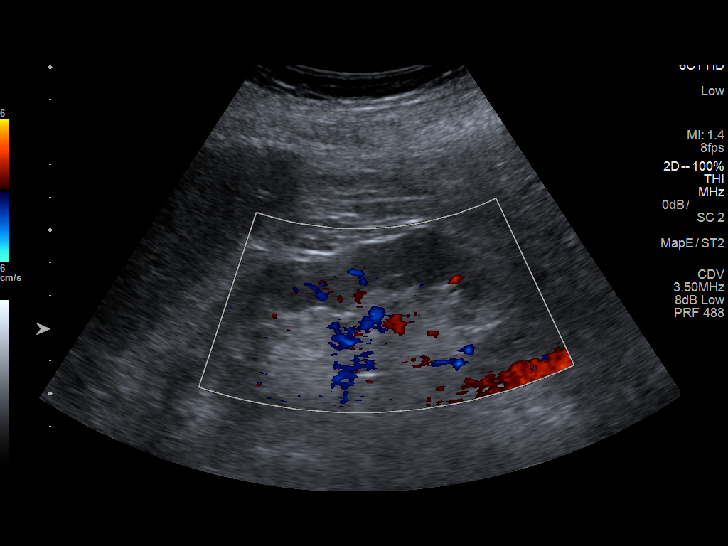
[im 13/35]
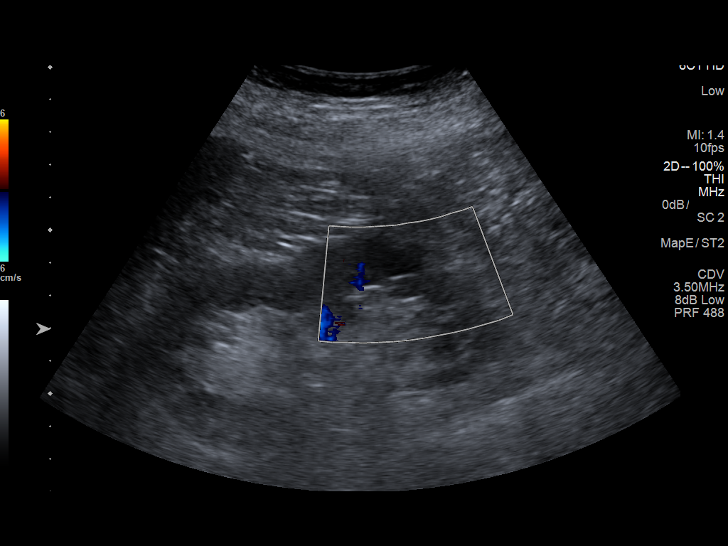
[im 16/35]
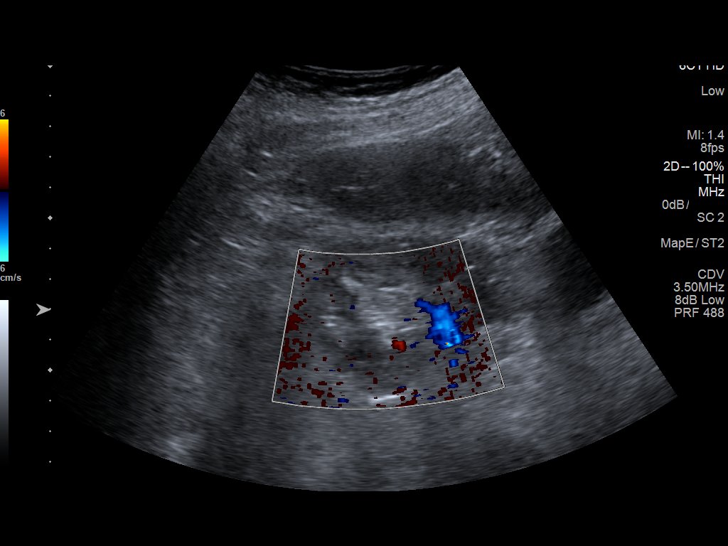
[im 19/35]
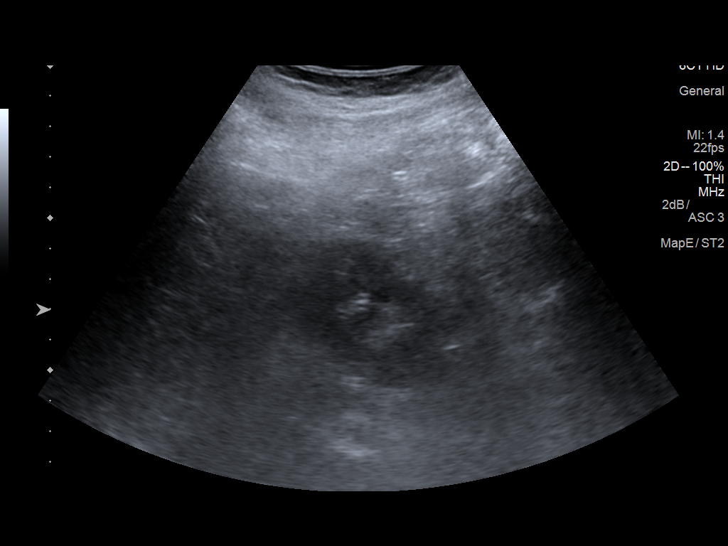
[im 22/35]
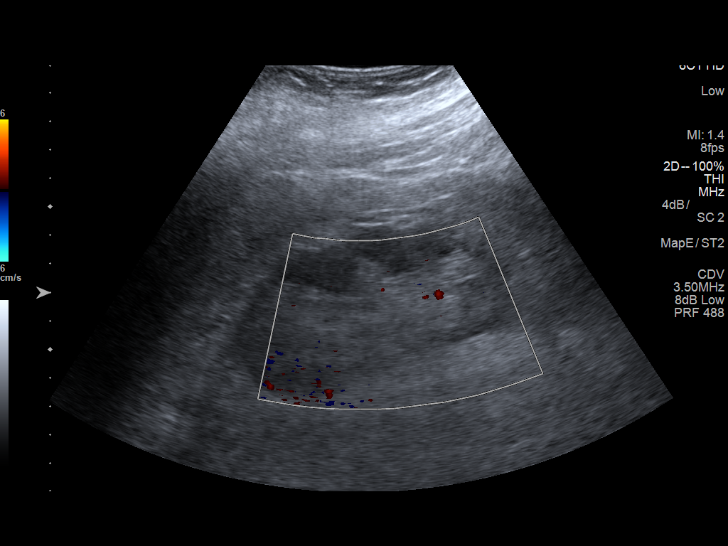
[im 23/35]
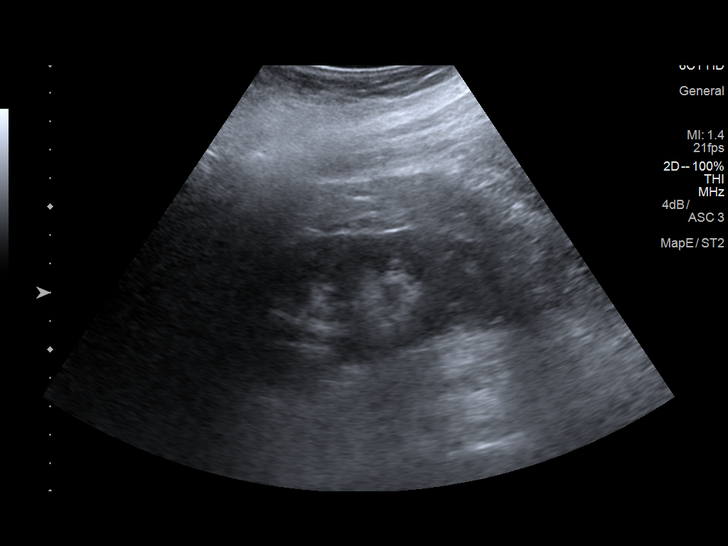
[im 26/35]
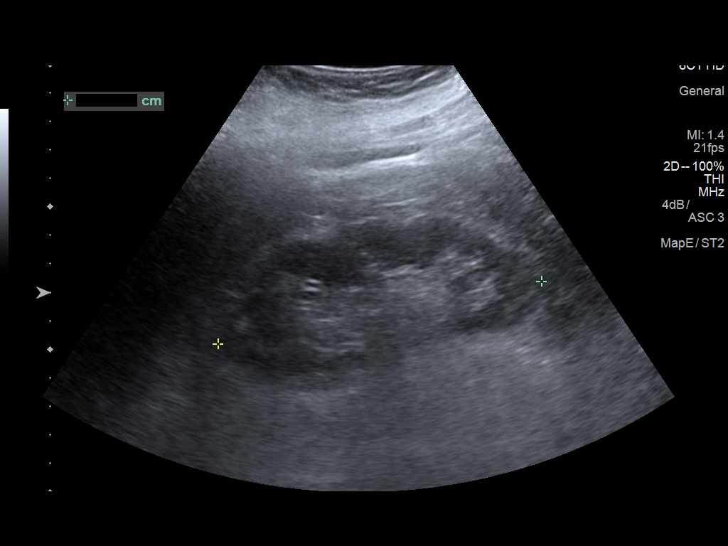
[im 29/35]
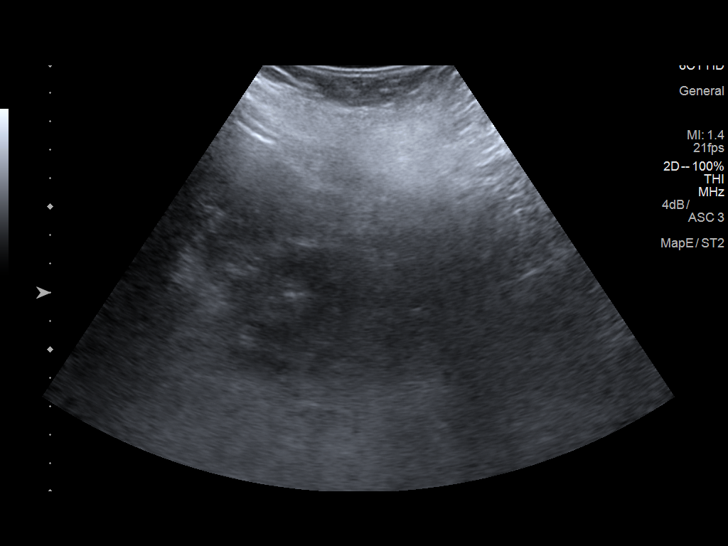
[im 32/35]
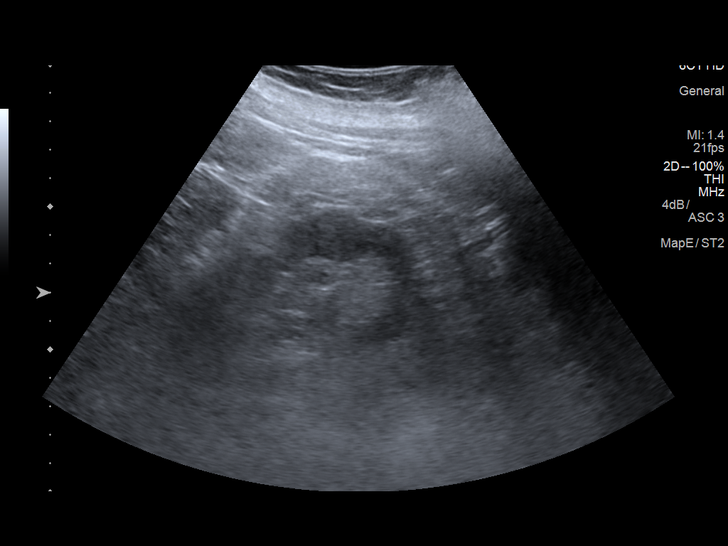
[im 35/35]
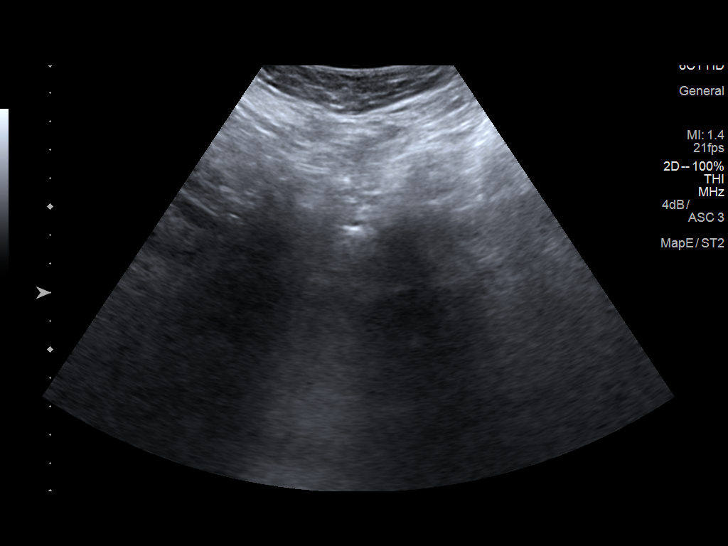

[14 of 25 positions shown; findings below may reference images not displayed]

FINDINGS: Right Kidney:

Length: 11.4 cm. Echogenicity within normal limits. No
hydronephrosis. 1.3 x 1.0 x 1.2 cm simple cyst present at the lower
pole.

Left Kidney:

Length: 11.6 cm. Echogenicity within normal limits. No mass or
hydronephrosis visualized.

Bladder:

Decompressed with a Foley catheter in place.
IMPRESSION: 1. No hydronephrosis.
2. 1.3 cm simple right renal cyst.
3. Foley catheter in place.

## 2020-06-03 DIAGNOSIS — Z85820 Personal history of malignant melanoma of skin: Secondary | ICD-10-CM | POA: Diagnosis not present

## 2020-06-03 DIAGNOSIS — L821 Other seborrheic keratosis: Secondary | ICD-10-CM | POA: Diagnosis not present

## 2020-06-03 DIAGNOSIS — L720 Epidermal cyst: Secondary | ICD-10-CM | POA: Diagnosis not present

## 2020-06-03 DIAGNOSIS — D1801 Hemangioma of skin and subcutaneous tissue: Secondary | ICD-10-CM | POA: Diagnosis not present

## 2020-06-03 DIAGNOSIS — L814 Other melanin hyperpigmentation: Secondary | ICD-10-CM | POA: Diagnosis not present

## 2020-06-03 DIAGNOSIS — D225 Melanocytic nevi of trunk: Secondary | ICD-10-CM | POA: Diagnosis not present

## 2020-06-03 DIAGNOSIS — D224 Melanocytic nevi of scalp and neck: Secondary | ICD-10-CM | POA: Diagnosis not present

## 2020-06-18 ENCOUNTER — Other Ambulatory Visit: Payer: Self-pay | Admitting: Family Medicine

## 2020-07-11 ENCOUNTER — Other Ambulatory Visit: Payer: Self-pay | Admitting: Family Medicine

## 2020-07-13 NOTE — Telephone Encounter (Signed)
Requesting: alprazolam Contract: 07/10/19 UDS: n/a Last Visit:05/21/20 Next Visit: advised to f/u 3 mo. Last Refill: 06/19/19(90,0)  Please Advise. Medication pending

## 2020-07-14 ENCOUNTER — Telehealth: Payer: Self-pay | Admitting: Family Medicine

## 2020-07-14 MED ORDER — MOLNUPIRAVIR EUA 200MG CAPSULE: 4.0000 | ORAL_CAPSULE | Freq: Two times a day (BID) | ORAL | 0 refills | Status: DC

## 2020-07-14 MED ORDER — MOLNUPIRAVIR EUA 200MG CAPSULE: 4.0000 | ORAL_CAPSULE | Freq: Two times a day (BID) | ORAL | 0 refills | Status: AC

## 2020-07-14 NOTE — Telephone Encounter (Signed)
Pt called and said that she took 2 at home covid test and they both came back positive, she wanted to know if you can prescribe her the medicine that reduces the symptoms, she doesn't know the name but said you should know.

## 2020-07-14 NOTE — Telephone Encounter (Signed)
Attempted to contact pt in regards to medication request.   Note: will need to know when sx started.

## 2020-07-14 NOTE — Telephone Encounter (Signed)
Rx resent to [facility] instead, pt was notified

## 2020-07-14 NOTE — Telephone Encounter (Signed)
She they started Friday, she was really tired and she said she started to get cold like symptoms and a scratchy throat. She said the medication she was talking about was paxlovid

## 2020-07-14 NOTE — Telephone Encounter (Signed)
OK, pt is w/in the 5d window. Paxlovid can't be taken with xarelto but I sent in the other oral antiviral for her to dake (molnupiravir).  (Pt's RFs for complications from covid infection are age>65, a-fib, CRI III).

## 2020-07-14 NOTE — Telephone Encounter (Signed)
Sx started 4 days ago please advise.

## 2020-07-14 NOTE — Telephone Encounter (Signed)
please resend RX for molnupiravir to [facility] for Katana Berthold [phone]? They will deliver to her.  Pt called them to verify & gave them CC info so they will bring to her as she now lives at [facility].

## 2020-07-15 ENCOUNTER — Telehealth: Payer: Self-pay | Admitting: Family Medicine

## 2020-07-15 NOTE — Telephone Encounter (Signed)
What is the name of the medication? She is wanting a script for the coughing from her positive covid test.  Have you contacted your pharmacy to request a refill? She needs a script.   Which pharmacy would you like this sent to? [facility]  [phone]   Patient notified that their request is being sent to the clinical staff for review and that they should receive a call once it is complete. If they do not receive a call within 72 hours they can check with their pharmacy or our office.

## 2020-07-15 NOTE — Telephone Encounter (Signed)
Pt did receive oral antiviral med and is taking as prescribed. She is concerned regarding hx of having chronic bronchitis and did not know if she needed something additional for cough.  Please Advise

## 2020-07-15 NOTE — Telephone Encounter (Signed)
Spoke with pt and gave recommendations, she is feeling a little better and will continue with what she has been doing thus far.

## 2020-07-15 NOTE — Telephone Encounter (Signed)
No, just needs to be taking mucinex dm for her cough. If she is wheezing or short of breath she needs to be seen to determine what may be additionally needed.--thx

## 2020-07-22 ENCOUNTER — Ambulatory Visit: Payer: Medicare PPO | Admitting: Internal Medicine

## 2020-08-05 ENCOUNTER — Telehealth: Payer: Self-pay | Admitting: Family Medicine

## 2020-08-05 DIAGNOSIS — M810 Age-related osteoporosis without current pathological fracture: Secondary | ICD-10-CM

## 2020-08-05 DIAGNOSIS — E2839 Other primary ovarian failure: Secondary | ICD-10-CM

## 2020-08-05 NOTE — Telephone Encounter (Signed)
Ok i'll put order in now, pls make sure order gets to [facility]--thx!

## 2020-08-05 NOTE — Telephone Encounter (Signed)
Printed and faxed referral to [facility] at number provided by [facility], [phone].

## 2020-08-05 NOTE — Telephone Encounter (Signed)
Patient is scheduled tomorrow for bone density exam . [facility] called to request referral placed asap.

## 2020-08-05 NOTE — Telephone Encounter (Signed)
Please advise 

## 2020-08-17 DIAGNOSIS — Z961 Presence of intraocular lens: Secondary | ICD-10-CM | POA: Diagnosis not present

## 2020-08-17 DIAGNOSIS — H524 Presbyopia: Secondary | ICD-10-CM | POA: Diagnosis not present

## 2020-08-26 ENCOUNTER — Other Ambulatory Visit: Payer: Self-pay

## 2020-08-27 ENCOUNTER — Encounter: Payer: Self-pay | Admitting: Family Medicine

## 2020-08-27 ENCOUNTER — Ambulatory Visit (INDEPENDENT_AMBULATORY_CARE_PROVIDER_SITE_OTHER): Payer: Medicare PPO | Admitting: Family Medicine

## 2020-08-27 ENCOUNTER — Telehealth: Payer: Self-pay

## 2020-08-27 VITALS — BP 123/69 | HR 68 | Temp 97.5°F | Resp 16 | Ht 61.0 in | Wt 168.8 lb

## 2020-08-27 DIAGNOSIS — I480 Paroxysmal atrial fibrillation: Secondary | ICD-10-CM | POA: Diagnosis not present

## 2020-08-27 DIAGNOSIS — Z1231 Encounter for screening mammogram for malignant neoplasm of breast: Secondary | ICD-10-CM

## 2020-08-27 DIAGNOSIS — L270 Generalized skin eruption due to drugs and medicaments taken internally: Secondary | ICD-10-CM | POA: Diagnosis not present

## 2020-08-27 DIAGNOSIS — Z7901 Long term (current) use of anticoagulants: Secondary | ICD-10-CM | POA: Diagnosis not present

## 2020-08-27 DIAGNOSIS — N183 Chronic kidney disease, stage 3 unspecified: Secondary | ICD-10-CM

## 2020-08-27 DIAGNOSIS — F411 Generalized anxiety disorder: Secondary | ICD-10-CM

## 2020-08-27 DIAGNOSIS — I5032 Chronic diastolic (congestive) heart failure: Secondary | ICD-10-CM | POA: Diagnosis not present

## 2020-08-27 DIAGNOSIS — I10 Essential (primary) hypertension: Secondary | ICD-10-CM | POA: Diagnosis not present

## 2020-08-27 DIAGNOSIS — Z79899 Other long term (current) drug therapy: Secondary | ICD-10-CM

## 2020-08-27 DIAGNOSIS — N2889 Other specified disorders of kidney and ureter: Secondary | ICD-10-CM

## 2020-08-27 DIAGNOSIS — E7800 Pure hypercholesterolemia, unspecified: Secondary | ICD-10-CM

## 2020-08-27 DIAGNOSIS — Z0000 Encounter for general adult medical examination without abnormal findings: Secondary | ICD-10-CM | POA: Diagnosis not present

## 2020-08-27 LAB — CBC WITH DIFFERENTIAL/PLATELET
Basophils Absolute: 0.1 10*3/uL (ref 0.0–0.1)
Basophils Relative: 0.7 % (ref 0.0–3.0)
Eosinophils Absolute: 0.5 10*3/uL (ref 0.0–0.7)
Eosinophils Relative: 3.8 % (ref 0.0–5.0)
HCT: 40.5 % (ref 36.0–46.0)
Hemoglobin: 12.9 g/dL (ref 12.0–15.0)
Lymphocytes Relative: 7.7 % — ABNORMAL LOW (ref 12.0–46.0)
Lymphs Abs: 0.9 10*3/uL (ref 0.7–4.0)
MCHC: 31.9 g/dL (ref 30.0–36.0)
MCV: 91.1 fl (ref 78.0–100.0)
Monocytes Absolute: 0.7 10*3/uL (ref 0.1–1.0)
Monocytes Relative: 5.8 % (ref 3.0–12.0)
Neutro Abs: 10 10*3/uL — ABNORMAL HIGH (ref 1.4–7.7)
Neutrophils Relative %: 82 % — ABNORMAL HIGH (ref 43.0–77.0)
Platelets: 259 10*3/uL (ref 150.0–400.0)
RBC: 4.45 Mil/uL (ref 3.87–5.11)
RDW: 15 % (ref 11.5–15.5)
WBC: 12.2 10*3/uL — ABNORMAL HIGH (ref 4.0–10.5)

## 2020-08-27 LAB — COMPREHENSIVE METABOLIC PANEL
ALT: 13 U/L (ref 0–35)
AST: 14 U/L (ref 0–37)
Albumin: 3.7 g/dL (ref 3.5–5.2)
Alkaline Phosphatase: 80 U/L (ref 39–117)
BUN: 28 mg/dL — ABNORMAL HIGH (ref 6–23)
CO2: 27 mEq/L (ref 19–32)
Calcium: 9 mg/dL (ref 8.4–10.5)
Chloride: 106 mEq/L (ref 96–112)
Creatinine, Ser: 1.34 mg/dL — ABNORMAL HIGH (ref 0.40–1.20)
GFR: 36.66 mL/min — ABNORMAL LOW (ref >60.00)
Glucose, Bld: 100 mg/dL — ABNORMAL HIGH (ref 70–99)
Potassium: 4 mEq/L (ref 3.5–5.1)
Sodium: 142 mEq/L (ref 135–145)
Total Bilirubin: 0.8 mg/dL (ref 0.2–1.2)
Total Protein: 6.3 g/dL (ref 6.0–8.3)

## 2020-08-27 LAB — LIPID PANEL
Cholesterol: 126 mg/dL (ref 0–200)
HDL: 42.6 mg/dL (ref >39.00)
LDL Cholesterol: 53 mg/dL (ref 0–99)
NonHDL: 83.52
Total CHOL/HDL Ratio: 3
Triglycerides: 152 mg/dL — ABNORMAL HIGH (ref 0.0–149.0)
VLDL: 30.4 mg/dL (ref 0.0–40.0)

## 2020-08-27 MED ORDER — PREDNISONE 10 MG PO TABS: ORAL_TABLET | ORAL | 0 refills | Status: DC

## 2020-08-27 MED ORDER — ZOSTER VAC RECOMB ADJUVANTED 50 MCG/0.5ML IM SUSR: 0.5000 mL | Freq: Once | INTRAMUSCULAR | 1 refills | Status: AC

## 2020-08-27 MED ORDER — HYDRALAZINE HCL 50 MG PO TABS: 50.0000 mg | ORAL_TABLET | Freq: Three times a day (TID) | ORAL | 3 refills | Status: DC

## 2020-08-27 MED ORDER — FUROSEMIDE 40 MG PO TABS: 40.0000 mg | ORAL_TABLET | Freq: Every day | ORAL | 3 refills | Status: DC

## 2020-08-27 MED ORDER — ATORVASTATIN CALCIUM 40 MG PO TABS: 40.0000 mg | ORAL_TABLET | Freq: Every day | ORAL | 3 refills | Status: DC

## 2020-08-27 NOTE — Telephone Encounter (Signed)
Spoke with pt and advised of recommendations, voiced understanding. F/u appt scheduled for 8/16  [10:08 AM] McGowen, Doren Custard Pls call Ruthine Dose and tell her it's ok to go ahead and start the prednisone I sent in today.  Dr. Amedeo Gory said this was fine and she SHOULD STILL go to him for root canal today. Follow up with me early next week

## 2020-08-27 NOTE — Addendum Note (Signed)
Addended by: Deveron Furlong D on: 08/27/2020 11:57 AM   Modules accepted: Orders

## 2020-08-27 NOTE — Patient Instructions (Signed)
Take 65m over the counter benadryl (generic name is diphenhydramine) every 6 hours for the next few days to help your rash. Don't take a pain pill at the same time as the benadryl.  Wait until you hear from [place] start the prednisone I sent to your pharmacy.

## 2020-08-27 NOTE — Progress Notes (Signed)
Office Note 08/27/2020  CC:  Chief Complaint  Patient presents with   Annual Exam    Pt is fasting    HPI:  Brenda Matthews is a 85 y.o. White female who is here for annual health maintenance exam and 3 mo f/u HTN, HLD, CRI III, anxiety with high risk med use, PAF, and chronic diastolic HF. A/P as of last visit: "1) HTN: well controlled on hydralazine 18m tid, amlodipine 52mqd, and lopressor 10062mid. bmet today.   2) HLD: tolerating atorva 92m36m. LDL was 50 back in 2019.  Trigs 220 about 9 mo ago. She is not fasting today. Plan flp and hepatic panel at next f/u in 3 mo.   3) CRI IIIa: avoids NSAIDs. She'll try to focus more on drinking adequate amounts of clear fluids. Of note, she takes lasix 92mg76mand 20 mEQ potassium supp bid. Lytes/cr today.   4) GAD +anxiety-related insomnia: doing well on alprazolam 0.5mg b26mprn. CSC UTD. She does not need new rx for this med today."  INTERIM HX: Pt has developed a full body rash. Saw dentist recently and suspected infection, was rx'd amoxil and was set up to get a root canal soon. She says she has a hx of intol to augmentin.  She took the amoxil few doses and had severe nausea so stopped it and was rx'd clindamycin and she 10d or so to start this, took a couple of doses and broke out in rash allover arms, trunk, face-->onset 2 d/a. She was given benadryl 2 tabs yesterday and this put her to sleep, no noted change in rash. No swelling of tongue or lips.  No wheezing or SOB.  No fever.  Other than recent rash she has been doing well, remains active, home bp's normal. No SOB or DOE, no recent LE edema. Hydrates well and avoids NSAIDs. Denies palpitations or racing heart.   PMP AWARE reviewed today: most recent rx for alprazolam 0.5mg wa26milled 06/19/19, # 90, rx 40 me. No red flags.  Past Medical History:  Diagnosis Date   Abnormal EKG approx 2008   Nuclear stress test neg;    Anxiety    with panic   CAP (community  acquired pneumonia) 08/2017   Hospitalization for CAP/acute diast HF/rapid a-fib   Cataract    s/p surgery--lens implants   Chronic diastolic heart failure (HCC) [place]  Chronic renal insufficiency, stage 3 (moderate) (HCC) [place]/2014   Renal u/s when in hosp 08/2017 for CAP/CHF showed symmetric kidneys, echogenicity normal, w/out hydronephrosis. Baseline GFR around 40 ml/min as of 10/2018.   DDD (degenerative disc disease), lumbar    Diverticulosis 2009   Fracture of radial shaft, left, closed 11/16/10   fell down flight of stairs   History of kidney stones    Hx of adenomatous colonic polyps 2002;2009;2015   surveillance colonoscopy 2009, +polypectomy done-tubular adenoma w/out high grade.  05/2013 tubular adenomas--recall 3 yrs   Hyperlipidemia    Hypertension    Low TSH level 02/18/2016   T3 norm, T4 mildly elevated--suspected sick euthyroid syndrome.  Repeat labs 06/2016: normal.   Melanoma in situ (HCC) 06[place]20   L LL   Osteoarthritis of both knees    viscosupplementation injections helpful 2020/21   Osteopenia    DEXA 08/2010; repeat DEXA 02/2015 worse: fosamax started.  06/2018 Dexa T score -2.4.  2020 maj osteop fx risk = 24%, Hip fx risk 7.3%. Rpt 2 yrs.   PAF (paroxysmal atrial fibrillation) (  [place]) 02/2016   when in post-op for ankle surgery; spontaneously converted in hosp, seen by Dr. Debara Pickett in consultation--metoprolol rate control + xarelto recommended.  Metop d/c due to hypot.  Plan to cont xarelto 20 mg qd indef due to CHAD-VASc score of 3.  A-fib w/RVR and CHF 07/2017; pt placed on amiodarone and plan for CV, but pt was in sinus rhythm when she went in for her DC CV, so she was sent home.   Peripheral edema    Pneumonia 2015   hx with sepsis   Rheumatic fever     Past Surgical History:  Procedure Laterality Date   APPENDECTOMY  1966   done during surgery for tubal pregnancy   CATARACT EXTRACTION W/ INTRAOCULAR LENS IMPLANT  2013   bilat   COLONOSCOPY W/ POLYPECTOMY   05/2013   +diverticulosis; recall 3 yrs (Dr. Henrene Pastor)   DEXA  02/2015; 06/2018   T score -2.1 in both femoral necks; FRAX 10 yr risk of major osteoporotic fracture was 21%---fosamax started. 06/2018 T score -2.4.  repeat 1 yr.   EYE SURGERY     LEFT HEART CATH AND CORONARY ANGIOGRAPHY N/A 09/01/2017   No angiographically significant CAD.  Upper normal left ventricular filling pressure.  Procedure: LEFT HEART CATH AND CORONARY ANGIOGRAPHY;  Surgeon: Nelva Bush, MD;  Location: [facility];  Service: Cardiovascular;  Laterality: N/A;   LUMBAR LAMINECTOMY/DECOMPRESSION MICRODISCECTOMY N/A 09/26/2018   Procedure: Decompressive Lumbar Laminectomy L5 S1 FORAMINOTOMY L5 S1  NERVE ROOT BILATERALLY and Microdiscectomy L5-S1 Left;  Surgeon: Latanya Maudlin, MD;  Location: [facility];  Service: Orthopedics;  Laterality: N/A;  110mn   OPEN REDUCTION INTERNAL FIXATION (ORIF) TIBIA/FIBULA FRACTURE Left 02/18/2016   Procedure: OPEN REDUCTION INTERNAL FIXATION (ORIF) Right ankle trimalleolar fracture;  Surgeon: JWylene Simmer MD;  Location: [facility]  Service: Orthopedics;  Laterality: Left;  requests 968ms   ORIF RADIAL FRACTURE  11/18/10   left; s/p slip on slippery floor and fell   THORACENTESIS  08/2017   diagnostic and therapeutic.  Transudative.  Clx neg.  (+pulm edema/diastolic HF)   TONSILLECTOMY     TRANSTHORACIC ECHOCARDIOGRAM  02/18/2016; 08/10/17   LVEF of 55-60%, mild AI and mild MR and normal biatrial size.  07/2017--normal LV function, mild enlarge aortic root, mild/mod TR, bilat atrial enlargement.    Family History  Problem Relation Age of Onset   Heart disease Mother    Heart disease Father    Hypertension Brother    Diabetes Sister    Colon cancer Neg Hx    Pancreatic cancer Neg Hx    Rectal cancer Neg Hx    Stomach cancer Neg Hx     Social History   Socioeconomic History   Marital status: Widowed    Spouse name: Not on file   Number of children: Not on file   Years of education: Not  on file   Highest education level: Not on file  Occupational History   Not on file  Tobacco Use   Smoking status: Never   Smokeless tobacco: Never  Vaping Use   Vaping Use: Never used  Substance and Sexual Activity   Alcohol use: Yes    Comment: rarely   Drug use: No   Sexual activity: Not on file  Other Topics Concern   Not on file  Social History Narrative   Widow, 2 sons.   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  Rare alcohol.   No drugs.  Exercise: 4  times per week, about 30m.   Social Determinants of Health   Financial Resource Strain: Not on file  Food Insecurity: Not on file  Transportation Needs: Not on file  Physical Activity: Not on file  Stress: Not on file  Social Connections: Not on file  Intimate Partner Violence: Not on file    Outpatient Medications Prior to Visit  Medication Sig Dispense Refill   Acetaminophen (TYLENOL 8 HOUR ARTHRITIS PAIN PO) Take by mouth in the morning and at bedtime.     albuterol (VENTOLIN HFA) 108 (90 Base) MCG/ACT inhaler INHALE 2 PUFFS INTO THE LUNGS EVERY 4 HOURS NEEDED FOR FOR WHEEZING OR SHORTNESS OF BREATH] 18 g 1   ALPRAZolam (XANAX) 0.5 MG tablet TAKE 1 TABLET BY MOUTH 3 TIMES DAILY AS NEEDED FOR STRESS 90 tablet 0   amLODipine (NORVASC) 5 MG tablet Take 1 tablet (5 mg total) by mouth daily. 90 tablet 3   Apoaequorin (PREVAGEN PO) Take 1 tablet by mouth daily.     Biotin 5000 MCG TABS Take 5,000 mcg by mouth daily.     Calcium Carbonate (CALCIUM 600 PO) Take 1,200 mg by mouth daily.      carboxymethylcellulose (REFRESH PLUS) 0.5 % SOLN Place 2 drops into both eyes daily as needed (dry/irritated eyes.).     Coenzyme Q10 200 MG capsule Take 200 mg by mouth daily.     fluticasone (FLONASE) 50 MCG/ACT nasal spray Place 2 sprays into both nostrils daily.      metoprolol tartrate (LOPRESSOR) 100 MG tablet TAKE 1 TABLET BY MOUTH 2 TIMES DAILY 180 tablet 0   Multiple Vitamins-Minerals (AIRBORNE PO) Take 1 tablet by mouth  daily as needed (immune support).     Multiple Vitamins-Minerals (CENTRUM SILVER ULTRA WOMENS) TABS Take 1 tablet by mouth every evening.     potassium chloride SA (KLOR-CON) 20 MEQ tablet Take 1 tablet (20 mEq total) by mouth 2 (two) times daily. 180 tablet 3   Turmeric 500 MG CAPS Take 500 mg by mouth daily.     XARELTO 15 MG TABS tablet TAKE 1 TABLET BY MOUTH EVERY DAY WITH SUPPER 90 tablet 1   atorvastatin (LIPITOR) 40 MG tablet TAKE 1 TABLET BY MOUTH EVERY DAY 90 tablet 1   furosemide (LASIX) 40 MG tablet TAKE 1 TABLET BY MOUTH EVERY DAY 90 tablet 1   hydrALAZINE (APRESOLINE) 50 MG tablet TAKE 1 TABLET BY MOUTH 3 TIMES DAILY 90 tablet 1   No facility-administered medications prior to visit.    Allergies  Allergen Reactions   Augmentin [Amoxicillin-Pot Clavulanate] Nausea And Vomiting and Other (See Comments)    "projectile vomiting" Has patient had a PCN reaction causing immediate rash, facial/tongue/throat swelling, SOB or lightheadedness with hypotension:No Has patient had a PCN reaction causing severe rash involving mucus membranes or skin necrosis:No Has patient had a PCN reaction that required hospitalization:No Has patient had a PCN reaction occurring within the last 10 years:Yes If all of the above answers are "NO", then may proceed with Cephalosporin use.     ROS Review of Systems  Constitutional:  Negative for appetite change, chills, fatigue and fever.  HENT:  Negative for congestion, dental problem, ear pain and sore throat.   Eyes:  Negative for discharge, redness and visual disturbance.  Respiratory:  Negative for cough, chest tightness, shortness of breath and wheezing.   Cardiovascular:  Negative for chest pain, palpitations and leg swelling.  Gastrointestinal:  Negative for abdominal pain, blood in stool, diarrhea, nausea and  vomiting.  Genitourinary:  Negative for difficulty urinating, dysuria, flank pain, frequency, hematuria and urgency.  Musculoskeletal:   Negative for arthralgias, back pain, joint swelling, myalgias and neck stiffness.  Skin:  Positive for rash (as per hpi). Negative for pallor.  Neurological:  Negative for dizziness, speech difficulty, weakness and headaches.  Hematological:  Negative for adenopathy. Does not bruise/bleed easily.  Psychiatric/Behavioral:  Negative for confusion and sleep disturbance. The patient is not nervous/anxious.    PE; Vitals with BMI 08/27/2020 05/21/2020 02/20/2020  Height 5' 1"  5' 1"  5' 1"   Weight 168 lbs 13 oz 173 lbs 3 oz 175 lbs 13 oz  BMI 31.91 76.28 31.51  Systolic 761 607 371  Diastolic 69 71 58  Pulse 68 64 68   Exam chaperoned by Deveron Furlong, CMA. Gen: Alert, well appearing.  Patient is oriented to person, place, time, and situation. AFFECT: pleasant, lucid thought and speech. ENT: Ears: EACs clear, normal epithelium.  TMs with good light reflex and landmarks bilaterally.  Eyes: no injection, icteris, swelling, or exudate.  EOMI, PERRLA. Nose: no drainage or turbinate edema/swelling.  No injection or focal lesion.  Mouth: lips without lesion/swelling.  Oral mucosa pink and moist.  Dentition intact and without obvious caries or gingival swelling.  Oropharynx without erythema, exudate, or swelling.  Neck: supple/nontender.  No LAD, mass, or TM.  Carotid pulses 2+ bilaterally, without bruits. CV: RRR, no m/r/g.   LUNGS: CTA bilat, nonlabored resps, good aeration in all lung fields. ABD: soft, NT, ND, BS normal.  No hepatospenomegaly or mass.  No bruits. EXT: no clubbing, cyanosis, or edema.  Musculoskeletal: no joint swelling, erythema, warmth, or tenderness.  ROM of all joints intact. Skin - diffuse spotty macular rash with scattered areas of confluence, blanchable--this covers face, neck, trunk, legs. No hives, no petechiae, no pustules or vesicles.  NO excoriations or areas of maceration.  Pertinent labs:  Lab Results  Component Value Date   TSH 0.68 10/22/2018   Lab Results   Component Value Date   WBC 5.4 07/26/2019   HGB 12.6 07/26/2019   HCT 38.6 07/26/2019   MCV 89.1 07/26/2019   PLT 189.0 07/26/2019   Lab Results  Component Value Date   CREATININE 1.19 05/21/2020   BUN 31 (H) 05/21/2020   NA 141 05/21/2020   K 4.2 05/21/2020   CL 105 05/21/2020   CO2 28 05/21/2020   Lab Results  Component Value Date   ALT 12 07/26/2019   AST 17 07/26/2019   ALKPHOS 69 07/26/2019   BILITOT 0.7 07/26/2019   Lab Results  Component Value Date   CHOL 192 09/10/2018   Lab Results  Component Value Date   HDL 73.10 09/10/2018   Lab Results  Component Value Date   LDLCALC 50 07/27/2017   Lab Results  Component Value Date   TRIG 220.0 (H) 09/10/2018   Lab Results  Component Value Date   CHOLHDL 3 09/10/2018    ASSESSMENT AND PLAN:   1) Allergic drug rash: either amoxil or clindamycin could be the culprit.  Cont benadryl 22m q6h. Will start prednisone taper but will clear with dentist first since she gets root canal today.  2) HTN: well controlled on amlod 5qd, hydralazine 50 tid, and lopressor 100 bid. Lytes/cr today.  3) HLD: tolerating atorva 40 qd long term. FLP and hepatic panel today.  4) CRI III: avoids nsaids and hydrates adequately. Lytes/cr today.  5) PAF: sounds like normal rhythm today. Cont lopressor 100 bid  and xarelto 55m qd. CBC today.  6) Chronic diastolic HF: no sign of fluid overload. Cont lasix 40 qd and potass 20 meq bid. Lytes/cr today.  7) Health maintenance exam: Reviewed age and gender appropriate health maintenance issues (prudent diet, regular exercise, health risks of tobacco and excessive alcohol, use of seatbelts, fire alarms in home, use of sunscreen).  Also reviewed age and gender appropriate health screening as well as vaccine recommendations. Vaccines: Shingrix-->rx sent to pharmacy.  Otherwise ALL UTD. Labs: cbc, cmet, flp. Cervical ca screening: no further screening indicated. Breast ca screening:  mammogram is set up this month. Colon ca screening: pt was set to get rpt colonoscopy in the fall of 2020, never happened but she will discuss with GI MD. Osteoporosis->pt has been on foasamax approx 5 yrs.  DEXA improved 2020.  Rpt DEXA is ordered and if stable then will d/c fosamax.  An After Visit Summary was printed and given to the patient.  FOLLOW UP:  Return in about 3 months (around 11/27/2020) for routine chronic illness f/u.  Signed:  PCrissie Sickles MD           08/27/2020

## 2020-09-01 ENCOUNTER — Ambulatory Visit: Payer: Medicare PPO | Admitting: Family Medicine

## 2020-09-01 ENCOUNTER — Encounter: Payer: Self-pay | Admitting: Family Medicine

## 2020-09-01 ENCOUNTER — Other Ambulatory Visit: Payer: Self-pay

## 2020-09-01 VITALS — BP 122/73 | HR 64 | Temp 97.5°F | Ht 61.0 in | Wt 169.4 lb

## 2020-09-01 DIAGNOSIS — L270 Generalized skin eruption due to drugs and medicaments taken internally: Secondary | ICD-10-CM | POA: Diagnosis not present

## 2020-09-01 NOTE — Progress Notes (Signed)
OFFICE VISIT  09/01/2020  CC:  Chief Complaint  Patient presents with   Follow-up    Rash; getting better. It seems to be traveling down to her legs, some itching but using benadryl.     HPI:    Patient is a 85 y.o. Caucasian female who presents for 5 day f/u allergic drug rash. A/P as of last visit: "1) Allergic drug rash: either amoxil or clindamycin could be the culprit.  Cont benadryl 1m q6h. Will start prednisone taper but will clear with dentist first since she gets root canal today.   2) HTN: well controlled on amlod 5qd, hydralazine 50 tid, and lopressor 100 bid. Lytes/cr today.   3) HLD: tolerating atorva 40 qd long term. FLP and hepatic panel today.   4) CRI III: avoids nsaids and hydrates adequately. Lytes/cr today.   5) PAF: sounds like normal rhythm today. Cont lopressor 100 bid and xarelto 131mqd. CBC today.   6) Chronic diastolic HF: no sign of fluid overload. Cont lasix 40 qd and potass 20 meq bid. Lytes/cr today.   7) Health maintenance exam: Reviewed age and gender appropriate health maintenance issues (prudent diet, regular exercise, health risks of tobacco and excessive alcohol, use of seatbelts, fire alarms in home, use of sunscreen).  Also reviewed age and gender appropriate health screening as well as vaccine recommendations. Vaccines: Shingrix-->rx sent to pharmacy.  Otherwise ALL UTD. Labs: cbc, cmet, flp. Cervical ca screening: no further screening indicated. Breast ca screening: mammogram is set up this month. Colon ca screening: pt was set to get rpt colonoscopy in the fall of 2020, never happened but she will discuss with GI MD. Osteoporosis->pt has been on foasamax approx 5 yrs.  DEXA improved 2020.  Rpt DEXA is ordered and if stable then will d/c fosamax."  INTERIM HX: All lab work stable last visit, mildly elev WBCs. She's feeling better, rash is fading significantly, she's taking prednisone taper w/out problem, continues to take 25-50  mg benadryl q6h and denies side effects. No new symptoms.  Past Medical History:  Diagnosis Date   Abnormal EKG approx 2008   Nuclear stress test neg;    Anxiety    with panic   CAP (community acquired pneumonia) 08/2017   Hospitalization for CAP/acute diast HF/rapid a-fib   Cataract    s/p surgery--lens implants   [facility]ronic diastolic heart failure [facility]   Chronic renal insufficiency, stage 3 (moderate) ([facility]18/2014   Renal u/s when in hosp 08/2017 for CAP/CHF showed symmetric kidneys, echogenicity normal, w/out hydronephrosis. Baseline GFR around 40 ml/min as of 10/2018.   DDD (degenerative disc disease), lumbar    Diverticulosis 2009   Fracture of radial shaft, left, closed 11/16/10   fell down flight of stairs   History of kidney stones    Hx of adenomatous colonic polyps 2002;2009;2015   surveillance colonoscopy 2009, +polypectomy done-tubular adenoma w/out high grade.  05/2013 tubular adenomas--recall 3 yrs   Hyperlipidemia    Hypertension    Low TSH level 02/18/2016   T3 norm, T4 mildly elevated--suspected sick euthyroid syndrome.  Repeat labs 06/2016: normal.   [place] in [facility] [place]   L LL   Osteoarthritis of both knees    viscosupplementation injections helpful 2020/21   Osteopenia    DEXA 08/2010; repeat DEXA 02/2015 worse: fosamax started.  06/2018 Dexa T score -2.4.  2020 maj osteop fx risk = 24%, Hip fx risk 7.3%. Rpt 2 yrs.   PAF ([place]ox[facility]mal [facility] fibrillation) ([facility]  02/2016   when in post-op for ankle surgery; spontaneously converted in hosp, seen by Dr. Debara Pickett in consultation--metoprolol rate control + xarelto recommended.  Metop d/c due to hypot.  Plan to cont xarelto 20 mg qd indef due to CHAD-VASc score of 3.  A-fib w/RVR and CHF 07/2017; pt placed on amiodarone and plan for CV, but pt was in sinus rhythm when she went in for her DC CV, so she was sent home.   Peripheral edema    Pneumonia 2015   hx with sepsis   Rheumatic fever     Past Surgical  History:  Procedure Laterality Date   APPENDECTOMY  1966   done during surgery for tubal pregnancy   CATARACT EXTRACTION W/ INTRAOCULAR LENS IMPLANT  2013   bilat   COLONOSCOPY W/ POLYPECTOMY  05/2013   +diverticulosis; recall 3 yrs (Dr. Henrene Pastor)   DEXA  02/2015; 06/2018   T score -2.1 in both femoral necks; FRAX 10 yr risk of major osteoporotic fracture was 21%---fosamax started. 06/2018 T score -2.4.  repeat 1 yr.   EYE SURGERY     LEFT HEART CATH AND CORONARY ANGIOGRAPHY N/A 09/01/2017   No angiographically significant CAD.  Upper normal left ventricular filling pressure.  Procedure: LEFT HEART CATH AND CORONARY ANGIOGRAPHY;  Surgeon: Nelva Bush, MD;  Location: [facility];  Service: Cardiovascular;  Laterality: N/A;   LUMBAR LAMINECTOMY/DECOMPRESSION MICRODISCECTOMY N/A 09/26/2018   Procedure: Decompressive Lumbar Laminectomy L5 S1 FORAMINOTOMY L5 S1  NERVE ROOT BILATERALLY and Microdiscectomy L5-S1 Left;  Surgeon: Latanya Maudlin, MD;  Location: [facility];  Service: Orthopedics;  Laterality: N/A;  157mn   OPEN REDUCTION INTERNAL FIXATION (ORIF) TIBIA/FIBULA FRACTURE Left 02/18/2016   Procedure: OPEN REDUCTION INTERNAL FIXATION (ORIF) Right ankle trimalleolar fracture;  Surgeon: JWylene Simmer MD;  [facility]  Service: Orthopedics;  Laterality: Left;  requests 936ms   ORIF RADIAL FRACTURE  11/18/10   left; s/p slip on slippery floor and fell   THORACENTESIS  08/2017   diagnostic and therapeutic.  Transudative.  Clx neg.  (+pulm edema/diastolic HF)   TONSILLECTOMY     TRANSTHORACIC ECHOCARDIOGRAM  02/18/2016; 08/10/17   LVEF of 55-60%, mild AI and mild MR and normal biatrial size.  07/2017--normal LV function, mild enlarge aortic root, mild/mod TR, bilat atrial enlargement.    Outpatient Medications Prior to Visit  Medication Sig Dispense Refill   Acetaminophen (TYLENOL 8 HOUR ARTHRITIS PAIN PO) Take by mouth in the morning and at bedtime.     albuterol (VENTOLIN HFA) 108 (90 Base)  MCG/ACT inhaler INHALE 2 PUFFS INTO THE LUNGS EVERY 4 HOURS NEEDED FOR FOR WHEEZING OR SHORTNESS OF BREATH] 18 g 1   ALPRAZolam (XANAX) 0.5 MG tablet TAKE 1 TABLET BY MOUTH 3 TIMES DAILY AS NEEDED FOR STRESS 90 tablet 0   amLODipine (NORVASC) 5 MG tablet Take 1 tablet (5 mg total) by mouth daily. 90 tablet 3   Apoaequorin (PREVAGEN PO) Take 1 tablet by mouth daily.     atorvastatin (LIPITOR) 40 MG tablet Take 1 tablet (40 mg total) by mouth daily. 90 tablet 3   Biotin 5000 MCG TABS Take 5,000 mcg by mouth daily.     Calcium Carbonate (CALCIUM 600 PO) Take 1,200 mg by mouth daily.      carboxymethylcellulose (REFRESH PLUS) 0.5 % SOLN Place 2 drops into both eyes daily as needed (dry/irritated eyes.).     Coenzyme Q10 200 MG capsule Take 200 mg by mouth daily.  fluticasone (FLONASE) 50 MCG/ACT nasal spray Place 2 sprays into both nostrils daily.      furosemide (LASIX) 40 MG tablet Take 1 tablet (40 mg total) by mouth daily. 90 tablet 3   hydrALAZINE (APRESOLINE) 50 MG tablet Take 1 tablet (50 mg total) by mouth 3 (three) times daily. 270 tablet 3   metoprolol tartrate (LOPRESSOR) 100 MG tablet TAKE 1 TABLET BY MOUTH 2 TIMES DAILY 180 tablet 0   Multiple Vitamins-Minerals (AIRBORNE PO) Take 1 tablet by mouth daily as needed (immune support).     Multiple Vitamins-Minerals (CENTRUM SILVER ULTRA WOMENS) TABS Take 1 tablet by mouth every evening.     potassium chloride SA (KLOR-CON) 20 MEQ tablet Take 1 tablet (20 mEq total) by mouth 2 (two) times daily. 180 tablet 3   predniSONE (DELTASONE) 10 MG tablet 3 tabs po qd x 3d, then 2 tabs po qd x 3d, then 1 tab po qd x 3d 18 tablet 0   Turmeric 500 MG CAPS Take 500 mg by mouth daily.     XARELTO 15 MG TABS tablet TAKE 1 TABLET BY MOUTH EVERY DAY WITH SUPPER 90 tablet 1   No facility-administered medications prior to visit.    Allergies  Allergen Reactions   Augmentin [Amoxicillin-Pot Clavulanate] Nausea And Vomiting and Other (See Comments)     "projectile vomiting" Has patient had a PCN reaction causing immediate rash, facial/tongue/throat swelling, SOB or lightheadedness with hypotension:No Has patient had a PCN reaction causing severe rash involving mucus membranes or skin necrosis:No Has patient had a PCN reaction that required hospitalization:No Has patient had a PCN reaction occurring within the last 10 years:Yes If all of the above answers are "NO", then may proceed with Cephalosporin use.    Amoxicillin Rash   Clindamycin/Lincomycin Rash    ROS As per HPI  PE: Vitals with BMI 09/01/2020 08/27/2020 05/21/2020  Height 5' 1"  5' 1"  5' 1"   Weight 169 lbs 6 oz 168 lbs 13 oz 173 lbs 3 oz  BMI 32.02 42.68 34.19  Systolic 622 297 989  Diastolic 73 69 71  Pulse 64 68 64     Gen: Alert, well appearing.  Patient is oriented to person, place, time, and situation. AFFECT: pleasant, lucid thought and speech. Faint blanchable macules scattered over face and arms, abd with same rash but more confluent. LL's anterior surface with deeper pink and nonblanchable fine macular rash.  No vesicles, pustules, or hives.  LABS:    Chemistry      Component Value Date/Time   NA 142 08/27/2020 0934   K 4.0 08/27/2020 0934   CL 106 08/27/2020 0934   CO2 27 08/27/2020 0934   BUN 28 (H) 08/27/2020 0934   CREATININE 1.34 (H) 08/27/2020 0934   CREATININE 1.23 (H) 10/23/2017 0938      Component Value Date/Time   CALCIUM 9.0 08/27/2020 0934   ALKPHOS 80 08/27/2020 0934   AST 14 08/27/2020 0934   ALT 13 08/27/2020 0934   BILITOT 0.8 08/27/2020 0934   BILITOT 0.4 10/12/2017 1538     Lab Results  Component Value Date   WBC 12.2 (H) 08/27/2020   HGB 12.9 08/27/2020   HCT 40.5 08/27/2020   MCV 91.1 08/27/2020   PLT 259.0 08/27/2020   Lab Results  Component Value Date   CHOL 126 08/27/2020   HDL 42.60 08/27/2020   LDLCALC 53 08/27/2020   LDLDIRECT 66.0 09/10/2018   TRIG 152.0 (H) 08/27/2020   CHOLHDL 3 08/27/2020  Lab Results   Component Value Date   TSH 0.68 10/22/2018    IMPRESSION AND PLAN:  Allergic drug rash (either amoxil or clinda): resolving with prednisone taper and benadryl. Finish pred taper and cont benadryl prn.  She got her root canal and feels much better, no oral pain at all now.  An After Visit Summary was printed and given to the patient.  FOLLOW UP: Return for has routine f/u appt 11/27/20.  Signed:  Crissie Sickles, MD           09/01/2020

## 2020-09-08 ENCOUNTER — Other Ambulatory Visit: Payer: Self-pay | Admitting: Internal Medicine

## 2020-09-08 NOTE — Telephone Encounter (Signed)
Prescription refill request for Xarelto received.  Indication:afib Last office visit:hilty 06/10/19 Weight:76.8kg Age:70fScr:1.34 08/27/20 CrCl:17 ml/min

## 2020-09-10 ENCOUNTER — Other Ambulatory Visit: Payer: Self-pay | Admitting: Family Medicine

## 2020-09-12 NOTE — Progress Notes (Addendum)
Cardiology Office Note:    Date:  09/24/2020   ID:  Brenda Matthews, DOB 02/06/1935, MRN [id number]  PCP:  [name], MD  Cardiologist:  [name], MD  Electrophysiologist:  None   Referring MD: [name], MD   Chief Complaint: follow-up of atrial fibrillation and diastolic CHF  History of Present Illness:    Brenda Matthews is a 85 y.o. female with a history of normal coronaries on cardiac catheterization in 08/2017, paroxysmal atrial fibrillation, chronic diastolic CHF, hypertension, hyperlipidemia, hyperthyroidism, and CKD stage III who is followed by  Dr. Debara Pickett  and presents today for routine follow-up.  Patient was first seen by Dr. Debara Pickett in 02/2016 for new onset of atrial fibrillation with RVR after induction with anesthesia during surgery for ankle fracture. Echo showed normal LV function. She was initially started on low dose beta-blocker and Xarelto. She was ultimately taken off beta-blocker due to low BP but thankfully converted back to sinus rhythm on her home. She was advised to continued Xarelto given CHA2DS2-VASc score of 3.   She had recurrent episode of atrial fibrillation with RVR in 07/2017. Echo showed LVEF of 50-55% with normal wall motion, biatrial enlargement, mild MR, and mild to moderate TR.Marland Kitchen She was started on Amiodarone and outpatient DCCV was arranged. However, when she presented for procedure, she was back in sinus rhythm. Patient was admitted in 08/2017 with acute on chronic hypoxic respiratory failure  secondary to pneumonia with sepsis and acute diastolic CHF. She was noted to be back in atrial fibrillation with RVR. Troponin was elevated so she underwent a cardiac catheterization which showed normal coronaries. She was diuresed and treated with antibiotics for her pneumonia. At follow-up visit, she was noted to be back in sinus rhythm. She has had no recurrence of atrial fibrillation since that time. She was last seen by Dr. Debara Pickett in 05/2019 at which time  she was doing well from a cardiac standpoint. Amiodarone was discontinued at this visit.  Since last visit, patient recently admitted from 09/14/2020 to 09/19/2020 for acute pancreatitis and pancreatic pseudocyst.  GI was consulted and she was treated conservatively with aggressive fluid hydration and pain control.  She was also treated for an acute UTI with.  She had no acute cardiac issues during this hospitalization. She was discharged to SNF [facility]).  Patient presents today for routine follow-up.  Here alone.  Patient doing relatively well from a cardiac standpoint.  She states she was pumped with fluid during her recent hospitalization has had significant lower extremity edema since then.  Left leg is chronically larger than the right after previous left ankle injury. They have been elevating her legs and using compression stockings at [facility] which does seem to help.  She notes some occasional shortness of breath with activity but states this is stable and improves with her albuterol inhaler.  No shortness of breath at rest.  No orthopnea or PND.  She will occasionally have some palpitations where she will feel like she is back in atrial fibrillation but this only last for 2 to 4 minutes.  Overall palpitations well controlled.  No lightheadedness, dizziness, syncope.  Past Medical History:  Diagnosis Date   Abnormal EKG approx 2008   Nuclear stress test neg;    Acute pancreatitis 09/2020   idiopathic.  +panc psudocyst   Anxiety    with panic   CAP (community acquired pneumonia) 08/2017   Hospitalization for CAP/acute diast HF/rapid a-fib  Cataract    s/p surgery--lens implants   Chronic diastolic heart failure ([place]) 2019   Chronic renal insufficiency, stage 3 (moderate) ([place]) 12/04/2012   Renal u/s when in hosp 08/2017 for CAP/CHF showed symmetric kidneys, echogenicity normal, w/out hydronephrosis. Baseline GFR around 40 ml/min as of 10/2018.   DDD (degenerative disc disease),  lumbar    Diverticulosis 2009   Fracture of radial shaft, left, closed 11/16/2010   fell down flight of stairs   History of kidney stones    Hx of adenomatous colonic polyps 2002;2009;2015   surveillance colonoscopy 2009, +polypectomy done-tubular adenoma w/out high grade.  05/2013 tubular adenomas--recall 3 yrs   Hyperlipidemia    Hypertension    Low TSH level 02/18/2016   T3 norm, T4 mildly elevated--suspected sick euthyroid syndrome.  Repeat labs 06/2016: normal.   Melanoma in situ ([place]) 06/2018   L LL   Osteoarthritis of both knees    viscosupplementation injections helpful 2020/21   Osteopenia    DEXA 08/2010; repeat DEXA 02/2015 worse: fosamax started.  06/2018 Dexa T score -2.4.  2020 maj osteop fx risk = 24%, Hip fx risk 7.3%. Rpt 2 yrs.   PAF (paroxysmal atrial fibrillation) ([place]) 02/2016   when in post-op for ankle surgery; spontaneously converted in hosp, seen by Dr. Debara Pickett in consultation--metoprolol rate control + xarelto recommended.  Metop d/c due to hypot.  Plan to cont xarelto 20 mg qd indef due to CHAD-VASc score of 3.  A-fib w/RVR and CHF 07/2017; pt placed on amiodarone and plan for CV, but pt was in sinus rhythm when she went in for her DC CV, so she was sent home.   Peripheral edema    Pneumonia 2015   hx with sepsis   Rheumatic fever     Past Surgical History:  Procedure Laterality Date   APPENDECTOMY  1966   done during surgery for tubal pregnancy   CATARACT EXTRACTION W/ INTRAOCULAR LENS IMPLANT  2013   bilat   COLONOSCOPY W/ POLYPECTOMY  05/2013   +diverticulosis; recall 3 yrs (Dr. Henrene Pastor)   DEXA  02/2015; 06/2018   T score -2.1 in both femoral necks; FRAX 10 yr risk of major osteoporotic fracture was 21%---fosamax started. 06/2018 T score -2.4.  repeat 1 yr.   ECTOPIC PREGNANCY SURGERY     EYE SURGERY     LEFT HEART CATH AND CORONARY ANGIOGRAPHY N/A 09/01/2017   No angiographically significant CAD.  Upper normal left ventricular filling pressure.  Procedure:  LEFT HEART CATH AND CORONARY ANGIOGRAPHY;  Surgeon: Nelva Bush, MD;  Location: [facility];  Service: Cardiovascular;  Laterality: N/A;   LUMBAR LAMINECTOMY/DECOMPRESSION MICRODISCECTOMY N/A 09/26/2018   Procedure: Decompressive Lumbar Laminectomy L5 S1 FORAMINOTOMY L5 S1  NERVE ROOT BILATERALLY and Microdiscectomy L5-S1 Left;  Surgeon: Latanya Maudlin, MD;  Location: [facility];  Service: Orthopedics;  Laterality: N/A;  139mn   OPEN REDUCTION INTERNAL FIXATION (ORIF) TIBIA/FIBULA FRACTURE Left 02/18/2016   Procedure: OPEN REDUCTION INTERNAL FIXATION (ORIF) Right ankle trimalleolar fracture;  Surgeon: JWylene Simmer MD;  [place] [facility]  Service: Orthopedics;  Laterality: Left;  requests [id number]   ORIF RADIAL FRACTURE  11/18/2010   left; s/p slip on slippery floor and fell   THORACENTESIS  08/2017   diagnostic and therapeutic.  Transudative.  Clx neg.  (+pulm edema/diastolic HF)   TONSILLECTOMY     TRANSTHORACIC ECHOCARDIOGRAM  02/18/2016; 08/10/17   LVEF of 55-60%, mild AI and mild MR and normal biatrial size.  07/2017--normal LV function,  mild enlarge aortic root, mild/mod TR, bilat atrial enlargement.    Current Medications: Current Meds  Medication Sig   acetaminophen (TYLENOL) 650 MG CR tablet Take 650 mg by mouth in the morning and at bedtime.   albuterol (VENTOLIN HFA) 108 (90 Base) MCG/ACT inhaler INHALE 2 PUFFS INTO LUNGS EVERY 4 HOURS AS NEEDED FOR WHEEZING OR SHORTNESS OF BREATH (Patient taking differently: Inhale 2 puffs into the lungs every 4 (four) hours as needed for wheezing or shortness of breath.)   amLODipine (NORVASC) 5 MG tablet Take 1 tablet (5 mg total) by mouth daily.   Apoaequorin (PREVAGEN PO) Take 1 tablet by mouth daily.   atorvastatin (LIPITOR) 40 MG tablet Take 1 tablet (40 mg total) by mouth daily.   Biotin 5000 MCG TABS Take 5,000 mcg by mouth daily.   Calcium Carbonate (CALCIUM 600 PO) Take 1,200 mg by mouth daily.    carboxymethylcellulose (REFRESH  PLUS) 0.5 % SOLN Place 2 drops into both eyes daily as needed (dry/irritated eyes.).   cephALEXin (KEFLEX) 500 MG capsule Take 1 capsule (500 mg total) by mouth 3 (three) times daily for 5 days.   Coenzyme Q10 200 MG capsule Take 200 mg by mouth daily.   fluticasone (FLONASE) 50 MCG/ACT nasal spray Place 2 sprays into both nostrils daily.    furosemide (LASIX) 40 MG tablet Take 1 tablet (40 mg total) by mouth daily.   hydrALAZINE (APRESOLINE) 50 MG tablet Take 1 tablet (50 mg total) by mouth 3 (three) times daily.   metoprolol tartrate (LOPRESSOR) 100 MG tablet TAKE 1 TABLET BY MOUTH 2 TIMES DAILY (Patient taking differently: Take 100 mg by mouth 2 (two) times daily.)   Multiple Vitamins-Minerals (CENTRUM SILVER ULTRA WOMENS) TABS Take 1 tablet by mouth every evening.   potassium chloride SA (KLOR-CON) 20 MEQ tablet Take 1 tablet (20 mEq total) by mouth 2 (two) times daily.   Turmeric 500 MG CAPS Take 500 mg by mouth daily.   XARELTO 15 MG TABS tablet TAKE 1 TABLET BY MOUTH EVERY DAY WITH SUPPER (Patient taking differently: Take 15 mg by mouth daily.)     Allergies:   Augmentin [amoxicillin-pot clavulanate], Amoxicillin, and Clindamycin/lincomycin   Social History   Socioeconomic History   Marital status: Widowed    Spouse name: Not on file   Number of children: Not on file   Years of education: Not on file   Highest education level: Not on file  Occupational History   Not on file  Tobacco Use   Smoking status: Never   Smokeless tobacco: Never  Vaping Use   Vaping Use: Never used  Substance and Sexual Activity   Alcohol use: Yes    Comment: rarely   Drug use: Never   Sexual activity: Not on file  Other Topics Concern   Not on file  Social History Narrative   Widow, 2 sons.   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   No tobacco.  Rare alcohol.   No drugs.  Exercise: 4 times per week, about 11m.   Social Determinants of Health   Financial Resource Strain: Not on file  Food  Insecurity: Not on file  Transportation Needs: Not on file  Physical Activity: Not on file  Stress: Not on file  Social Connections: Not on file     Family History: The patient's family history includes Diabetes in her sister; Heart disease in her father and mother; Hypertension in her brother. There is no history of Colon cancer, Pancreatic cancer, Rectal  cancer, or Stomach cancer.  ROS:   Please see the history of present illness.     EKGs/Labs/Other Studies Reviewed:    The following studies were reviewed today:  Echocardiogram 08/10/2017: Study Conclusions: - Left ventricle: The cavity size was normal. Wall thickness was increased in a pattern of mild LVH. Systolic function was normal. The estimated ejection fraction was in the range of 50% to 55%.  Wall motion was normal; there were no regional wall motion abnormalities.  - Aortic valve: There was trivial regurgitation.  - Aortic root: The aortic root was mildly dilated.  - Mitral valve: There was mild regurgitation.  - Left atrium: The atrium was severely dilated.  - Right atrium: The atrium was mildly dilated.  - Tricuspid valve: There was mild-moderate regurgitation.  - Pulmonary arteries: PA peak pressure: 32 mm Hg (S).  - Pericardium, extracardiac: A trivial pericardial effusion was identified.   Impressions: - Normal LV systolic function; mild LVH; trace AI; mildly dilated aortic root; mild MR; biatrial enlargement; mild to moderate TR.  _______________  Left Cardiac Catheterization 09/01/2017: Conclusions: No angiographically significant coronary artery disease. Upper normal left ventricular filling pressure.   Recommendations: Continue medical therapy of diastolic heart failure and atrial fibrillation. Gentle post-catheterization hydration given resolved acute kidney injury. Restart heparin infusion 2 hours after TR band removal; rivaroxaban could be restarted as soon as tomorrow.   Recommend to resume  Rivaroxaban, at currently prescribed dose and frequency, on 09/01/17.  Concurrent antiplatelet therapy not recommended.   EKG:  EKG ordered today. EKG personally reviewed and demonstrates normal sinus rhythm, rate 73 bpm, with PAC, LVH, poor R wave progression and possible Q waves in precordial leads, and isolated T wave inversions in lead III. Left axis deviation. QTc 434 ms.   Recent Labs: 09/16/2020: ALT 11; Hemoglobin 10.2; Platelets 170 09/18/2020: BUN 19; Creatinine, Ser 0.91; Magnesium 2.0; Potassium 4.3; Sodium 133  Recent Lipid Panel    Component Value Date/Time   CHOL 126 08/27/2020 0934   TRIG 152.0 (H) 08/27/2020 0934   HDL 42.60 08/27/2020 0934   CHOLHDL 3 08/27/2020 0934   VLDL 30.4 08/27/2020 0934   LDLCALC 53 08/27/2020 0934   LDLDIRECT 66.0 09/10/2018 0913    Physical Exam:    Vital Signs: BP 130/70 (BP Location: Left Arm, Patient Position: Sitting, Cuff Size: Large)   Pulse 73   Ht 5' 1"  (1.549 m)   Wt 173 lb (78.5 kg)   LMP  (LMP Unknown)   SpO2 97%   BMI 32.69 kg/m     Wt Readings from Last 3 Encounters:  09/24/20 173 lb (78.5 kg)  09/14/20 160 lb (72.6 kg)  09/01/20 169 lb 6.4 oz (76.8 kg)     General: 85 y.o. Caucasian female in no acute distress. HEENT: Normocephalic and atraumatic. Sclera clear.  Neck: Supple. No carotid bruits. No JVD. Heart: RRR with occasional ectopy. Distinct S1 and S2. No murmurs, gallops, or rubs. Radial pulses 2+ and equal bilaterally. Lungs: No increased work of breathing. Very faint crackles in bilateral bases; otherwise, clear to ausculation. Abdomen: Soft, non-distended, and non-tender to palpation.  MSK: Ambulating with a walker. Extremities: 2-3+ pitting edema of bilateral lower extremities (left chronically larger than right). Skin: Warm and dry. Neuro: Alert and oriented x3. No focal deficits. Psych: Normal affect. Responds appropriately.  Assessment:    1. Paroxysmal atrial fibrillation (HCC)   2. Chronic  diastolic CHF (congestive heart failure) ([place])   3. Primary hypertension   4.  Hyperlipidemia, unspecified hyperlipidemia type   5. Stage 3 chronic kidney disease, unspecified whether stage 3a or 3b CKD (HCC)     Plan:    Paroxysmal Atrial Fibrillation - Maintaining sinus rhythm.  - Continue Lopressor 186m twice daily.  - Currently on Xarelto 135mdaily. This is the reduced dose. CrCl 58 mL/min based on most recent labs on 09/18/2020 which would qualify patient for full dose of 2038maily. We are getting repeat labs today. If CrCl still above >50, will increase to 65m42mily.  - Will recheck CBC today.  Chronic Diastolic CHF Lower Extremity Edema - Echo in 07/2017 showed LVEF of 50-55% with normal wall motion, biatrial enlargement, mild MR, and mild to moderate TR. - Patient has significant lower extremity edema and very faint crackles.  Suspect this is due to IV fluids during recent hospitalization for pancreatitis. - Will check BNP. - Will increase Lasix to 40mg69mce daily for 3 days and then decrease back to 40mg 29m daily. Continue current dose of potassium chloride 20 mEq twice daily. - Discussed importance of daily weights and sodium/fluid restrictions.  - Will recheck CMET today.  Hypertension - BP well controlled.  - Continue current medications: Amlodipine 5mg da48m, Lopressor 100mg tw90mdaily, Hydralazine 50mg thr9mimes daily. Rehab facility only given patient Lopressor once a day. Will make sure to emphasize in AVS to give Lopressor twice daily.  Hyperlipidemia - Lipid panel on 08/27/2020: Total Cholesterol 126, Triglycerides 152, HDL 42.60, LDL 53. - Continue Lipitor 40mg dail55m CKD Stage III - Baseline creatinine 1.1 to 1.6. Stable at 0.91 on most labs on 09/18/2020.  - Will repeat CMET today.  Normocytic Anemia - Hemoglobin 10.2 during recently admission for pancreatitis. Baseline around 12-13. - Will repeat CBC today.  Disposition: Follow up in 3-4  months.   Medication Adjustments/Labs and Tests Ordered: Current medicines are reviewed at length with the patient today.  Concerns regarding medicines are outlined above.  Orders Placed This Encounter  Procedures   Comprehensive Metabolic Panel (CMET)   Pro b natriuretic peptide (BNP)   CBC   EKG 12-Lead   No orders of the defined types were placed in this encounter.   Patient Instructions  Medication Instructions:   FOR 3 DAYS ONLY TAKE LASIX 40 MG TWICE A DAY THEN RESUME BACK TO 40 MG ONCE  A DAY   *If you need a refill on your cardiac medications before your next appointment, please call your pharmacy*   Lab Work: CMET CBC AND BNP TODAY   If you have labs (blood work) drawn today and your tests are completely normal, you will receive your results only by: MyChart [facility] MyChart) OR A paper copy in the mail If you have any lab test that is abnormal or we need to change your treatment, we will call you to review the results.   Testing/Procedures:NONE ORDERED  TODAY   Follow-Up: [facility].  As part of our continuing mission to provide you with exceptional heart care, we have created designated Provider Care Teams.  These Care Teams include your primary Cardiologist (physician) and Advanced Practice Providers (APPs -  Physician Assistants and Nurse Practitioners) who all work together to provide you with the care you need, when you need it.  We recommend signing up for the patient portal called "MyChart".  Sign up information is provided on this After Visit Summary.  MyChart is used to  connect with patients for Virtual Visits (Telemedicine).  Patients are able to view lab/test results, encounter notes, upcoming appointments, etc.  Non-urgent messages can be sent to your provider as well.   To learn more about what you can do with MyChart, go to [email or web address].    Your next appointment:   4 month(s)  January   The format for your next appointment:   In Person  Provider:   You may see [name], MD or one of the following Advanced Practice Providers on your designated Care Team:   Sande Rives, PA-C   Other Instructions   [place] OF METOPROLOL IN REHAB WHICH IS TWICE A DAY NOT ONCE A DAY   Heart Failure, Self-Care Heart failure is a serious condition. The following information explains things you need to do to take care of yourself at home. To help you stay as healthy as possible, you may be asked to change your diet, take certain medicines, and make other changes in your life. Your doctor may also give you more specific instructions. If you have problems or questions, call your doctor. What are the risks? Having heart failure makes it more likely for you to have some problems. These problems can get worse if you do not take good care of yourself. Problems may include: Damage to the kidneys, liver, or lungs. Malnutrition. Abnormal heart rhythms. Blood clotting problems that could cause a stroke. Supplies needed: Scale for weighing yourself. Blood pressure monitor. Notebook. Medicines. How to care for yourself when you have heart failure Medicines Take over-the-counter and prescription medicines only as told by your doctor. Take your medicines every day. Do not stop taking your medicine unless your doctor tells you to do so. Do not skip any medicines. Get your prescriptions refilled before you run out of medicine. This is important. Talk with your doctor if you cannot afford your medicines. Eating and drinking  Eat heart-healthy foods. Talk with a diet specialist (dietitian) to create an eating plan. Limit salt (sodium) if told by your doctor. Ask your diet specialist to tell you which seasonings are healthy for your heart. [place] in healthy ways instead of frying. Healthy ways of cooking include roasting, grilling, broiling,  baking, poaching, steaming, and stir-frying. Choose foods that: Have no trans fat. Are low in saturated fat and cholesterol. Choose healthy foods, such as: Fresh or frozen fruits and vegetables. Fish. Low-fat (lean) meats. Legumes, such as beans, peas, and lentils. Fat-free or low-fat dairy products. Whole-grain foods. High-fiber foods. Limit how much fluid you drink, if told by your doctor. Alcohol use Do not drink alcohol if: Your doctor tells you not to drink. Your heart was damaged by alcohol, or you have very bad heart failure. You are pregnant, may be pregnant, or are planning to become pregnant. If you drink alcohol: Limit how much you have to: 0-1 drink a day for women. 0-2 drinks a day for men. Know how much alcohol is in your drink. In the [place] one drink equals one 12 oz bottle of beer (355 mL), one 5 oz glass of wine (148 mL), or one 1 oz glass of hard liquor (44 mL). Lifestyle  Do not smoke or use any products that contain nicotine or tobacco. If you need help quitting, ask your doctor. Do not use nicotine gum or patches before talking to your doctor. Do not use illegal drugs. Lose weight if told by your doctor. Do physical activity if  told by your doctor. Talk to your doctor before you begin an exercise if: You are an older adult. You have very bad heart failure. Learn to manage stress. If you need help, ask your doctor. Get physical rehab (rehabilitation) to help you stay independent and to help with your quality of life. Participate in a cardiac rehab program. This program helps you improve your health through exercise, education, and counseling. Plan time to rest when you get tired. Check weight and blood pressure  Weigh yourself every day. This will help you to know if fluid is building up in your body. Weigh yourself every morning after you pee (urinate) and before you eat breakfast. Wear the same amount of clothing each time. Write down your daily weight.  Give your record to your doctor. Check and write down your blood pressure as told by your doctor. Check your pulse as told by your doctor. Dealing with very hot and very cold weather If it is very hot: Avoid activities that take a lot of energy. Use air conditioning or fans, or find a cooler place. Avoid caffeine and alcohol. Wear clothing that is loose-fitting, lightweight, and light-colored. If it is very cold: Avoid activities that take a lot of energy. Layer your clothes. Wear mittens or gloves, a hat, and a face covering when you go outside. Avoid alcohol. Follow these instructions at home: Stay up to date with shots (vaccines). Get pneumococcal and flu (influenza) shots. Keep all follow-up visits. Contact a doctor if: You gain 2-3 lb (1-1.4 kg) in 24 hours or 5 lb (2.3 kg) in a week. You have increasing shortness of breath. You cannot do your normal activities. You get tired easily. You cough a lot. You do not feel like eating or feel like you may vomit (nauseous). You have swelling in your hands, feet, ankles, or belly (abdomen). You cannot sleep well because it is hard to breathe. You feel like your heart is beating fast (palpitations). You get dizzy when you stand up. You feel depressed or sad. Get help right away if: You have trouble breathing. You or someone else notices a change in your behavior, such as having trouble staying awake. You have chest pain or discomfort. You pass out (faint). These symptoms may be an emergency. Get help right away. Call your local emergency services (911 in the [place] Do not wait to see if the symptoms will go away. Do not drive yourself to the hospital. Summary Heart failure is a serious condition. To care for yourself, you may have to change your diet, take medicines, and make other lifestyle changes. Take your medicines every day. Do not stop taking them unless your doctor tells you to do so. Limit salt and eat heart-healthy  foods. Ask your doctor if you can drink alcohol. You may have to stop alcohol use if you have very bad heart failure. Contact your doctor if you gain weight quickly or feel that your heart is beating too fast. Get help right away if you pass out or have chest pain or trouble breathing. This information is not intended to replace advice given to you by your health care provider. Make sure you discuss any questions you have with your health care provider. Document Revised: 07/27/2019 Document Reviewed: 07/27/2019 Elsevier Patient Education  2022 [place], Darreld Mclean, [place]  09/24/2020 10:53 AM    [place]

## 2020-09-14 ENCOUNTER — Inpatient Hospital Stay (HOSPITAL_BASED_OUTPATIENT_CLINIC_OR_DEPARTMENT_OTHER)
Admission: EM | Admit: 2020-09-14 | Discharge: 2020-09-19 | DRG: 439 | Disposition: A | Discharge status: Skilled Nursing Facility | Payer: Medicare PPO | Source: Skilled Nursing Facility | Attending: Internal Medicine | Admitting: Internal Medicine

## 2020-09-14 ENCOUNTER — Inpatient Hospital Stay (HOSPITAL_COMMUNITY): Payer: Medicare PPO

## 2020-09-14 ENCOUNTER — Other Ambulatory Visit: Payer: Self-pay

## 2020-09-14 ENCOUNTER — Emergency Department (HOSPITAL_BASED_OUTPATIENT_CLINIC_OR_DEPARTMENT_OTHER): Payer: Medicare PPO

## 2020-09-14 ENCOUNTER — Encounter (HOSPITAL_BASED_OUTPATIENT_CLINIC_OR_DEPARTMENT_OTHER): Payer: Self-pay | Admitting: *Deleted

## 2020-09-14 DIAGNOSIS — R4182 Altered mental status, unspecified: Secondary | ICD-10-CM | POA: Diagnosis not present

## 2020-09-14 DIAGNOSIS — K8689 Other specified diseases of pancreas: Secondary | ICD-10-CM | POA: Diagnosis not present

## 2020-09-14 DIAGNOSIS — B9620 Unspecified Escherichia coli [E. coli] as the cause of diseases classified elsewhere: Secondary | ICD-10-CM | POA: Diagnosis not present

## 2020-09-14 DIAGNOSIS — K859 Acute pancreatitis without necrosis or infection, unspecified: Secondary | ICD-10-CM | POA: Diagnosis not present

## 2020-09-14 DIAGNOSIS — D631 Anemia in chronic kidney disease: Secondary | ICD-10-CM | POA: Diagnosis not present

## 2020-09-14 DIAGNOSIS — N1831 Chronic kidney disease, stage 3a: Secondary | ICD-10-CM | POA: Diagnosis not present

## 2020-09-14 DIAGNOSIS — D649 Anemia, unspecified: Secondary | ICD-10-CM | POA: Diagnosis not present

## 2020-09-14 DIAGNOSIS — R1013 Epigastric pain: Secondary | ICD-10-CM | POA: Diagnosis not present

## 2020-09-14 DIAGNOSIS — Z881 Allergy status to other antibiotic agents status: Secondary | ICD-10-CM

## 2020-09-14 DIAGNOSIS — Z880 Allergy status to penicillin: Secondary | ICD-10-CM

## 2020-09-14 DIAGNOSIS — M8580 Other specified disorders of bone density and structure, unspecified site: Secondary | ICD-10-CM | POA: Diagnosis present

## 2020-09-14 DIAGNOSIS — K8500 Idiopathic acute pancreatitis without necrosis or infection: Principal | ICD-10-CM | POA: Diagnosis present

## 2020-09-14 DIAGNOSIS — M170 Bilateral primary osteoarthritis of knee: Secondary | ICD-10-CM | POA: Diagnosis present

## 2020-09-14 DIAGNOSIS — Z833 Family history of diabetes mellitus: Secondary | ICD-10-CM | POA: Diagnosis not present

## 2020-09-14 DIAGNOSIS — Z20822 Contact with and (suspected) exposure to covid-19: Secondary | ICD-10-CM | POA: Diagnosis present

## 2020-09-14 DIAGNOSIS — J9811 Atelectasis: Secondary | ICD-10-CM | POA: Diagnosis not present

## 2020-09-14 DIAGNOSIS — Z79899 Other long term (current) drug therapy: Secondary | ICD-10-CM | POA: Diagnosis not present

## 2020-09-14 DIAGNOSIS — I5032 Chronic diastolic (congestive) heart failure: Secondary | ICD-10-CM | POA: Diagnosis not present

## 2020-09-14 DIAGNOSIS — N183 Chronic kidney disease, stage 3 unspecified: Secondary | ICD-10-CM | POA: Diagnosis not present

## 2020-09-14 DIAGNOSIS — K863 Pseudocyst of pancreas: Secondary | ICD-10-CM | POA: Diagnosis present

## 2020-09-14 DIAGNOSIS — N281 Cyst of kidney, acquired: Secondary | ICD-10-CM | POA: Diagnosis not present

## 2020-09-14 DIAGNOSIS — I10 Essential (primary) hypertension: Secondary | ICD-10-CM | POA: Diagnosis not present

## 2020-09-14 DIAGNOSIS — R109 Unspecified abdominal pain: Secondary | ICD-10-CM | POA: Diagnosis not present

## 2020-09-14 DIAGNOSIS — K862 Cyst of pancreas: Secondary | ICD-10-CM | POA: Diagnosis not present

## 2020-09-14 DIAGNOSIS — Z8249 Family history of ischemic heart disease and other diseases of the circulatory system: Secondary | ICD-10-CM

## 2020-09-14 DIAGNOSIS — Z7901 Long term (current) use of anticoagulants: Secondary | ICD-10-CM | POA: Diagnosis not present

## 2020-09-14 DIAGNOSIS — E7800 Pure hypercholesterolemia, unspecified: Secondary | ICD-10-CM | POA: Diagnosis present

## 2020-09-14 DIAGNOSIS — I480 Paroxysmal atrial fibrillation: Secondary | ICD-10-CM | POA: Diagnosis present

## 2020-09-14 DIAGNOSIS — N390 Urinary tract infection, site not specified: Secondary | ICD-10-CM | POA: Diagnosis not present

## 2020-09-14 DIAGNOSIS — I130 Hypertensive heart and chronic kidney disease with heart failure and stage 1 through stage 4 chronic kidney disease, or unspecified chronic kidney disease: Secondary | ICD-10-CM | POA: Diagnosis present

## 2020-09-14 DIAGNOSIS — K567 Ileus, unspecified: Secondary | ICD-10-CM | POA: Diagnosis not present

## 2020-09-14 DIAGNOSIS — R933 Abnormal findings on diagnostic imaging of other parts of digestive tract: Secondary | ICD-10-CM | POA: Diagnosis not present

## 2020-09-14 DIAGNOSIS — I5033 Acute on chronic diastolic (congestive) heart failure: Secondary | ICD-10-CM | POA: Diagnosis present

## 2020-09-14 DIAGNOSIS — M5136 Other intervertebral disc degeneration, lumbar region: Secondary | ICD-10-CM | POA: Diagnosis present

## 2020-09-14 DIAGNOSIS — R1032 Left lower quadrant pain: Secondary | ICD-10-CM | POA: Diagnosis present

## 2020-09-14 DIAGNOSIS — R1084 Generalized abdominal pain: Secondary | ICD-10-CM | POA: Diagnosis not present

## 2020-09-14 DIAGNOSIS — Z7401 Bed confinement status: Secondary | ICD-10-CM | POA: Diagnosis not present

## 2020-09-14 DIAGNOSIS — K5900 Constipation, unspecified: Secondary | ICD-10-CM | POA: Diagnosis not present

## 2020-09-14 DIAGNOSIS — J90 Pleural effusion, not elsewhere classified: Secondary | ICD-10-CM | POA: Diagnosis not present

## 2020-09-14 DIAGNOSIS — R0602 Shortness of breath: Secondary | ICD-10-CM

## 2020-09-14 LAB — CBC WITH DIFFERENTIAL/PLATELET
Abs Immature Granulocytes: 0.07 10*3/uL (ref 0.00–0.07)
Basophils Absolute: 0 10*3/uL (ref 0.0–0.1)
Basophils Relative: 0 %
Eosinophils Absolute: 0 10*3/uL (ref 0.0–0.5)
Eosinophils Relative: 0 %
HCT: 42.6 % (ref 36.0–46.0)
Hemoglobin: 13.6 g/dL (ref 12.0–15.0)
Immature Granulocytes: 1 %
Lymphocytes Relative: 7 %
Lymphs Abs: 1 10*3/uL (ref 0.7–4.0)
MCH: 29.5 pg (ref 26.0–34.0)
MCHC: 31.9 g/dL (ref 30.0–36.0)
MCV: 92.4 fL (ref 80.0–100.0)
Monocytes Absolute: 1.4 10*3/uL — ABNORMAL HIGH (ref 0.1–1.0)
Monocytes Relative: 9 %
Neutro Abs: 12.8 10*3/uL — ABNORMAL HIGH (ref 1.7–7.7)
Neutrophils Relative %: 83 %
Platelets: 207 10*3/uL (ref 150–400)
RBC: 4.61 MIL/uL (ref 3.87–5.11)
RDW: 15 % (ref 11.5–15.5)
WBC: 15.4 10*3/uL — ABNORMAL HIGH (ref 4.0–10.5)
nRBC: 0 % (ref 0.0–0.2)

## 2020-09-14 LAB — COMPREHENSIVE METABOLIC PANEL
ALT: 15 U/L (ref 0–44)
AST: 19 U/L (ref 15–41)
Albumin: 3.5 g/dL (ref 3.5–5.0)
Alkaline Phosphatase: 81 U/L (ref 38–126)
Anion gap: 9 (ref 5–15)
BUN: 15 mg/dL (ref 8–23)
CO2: 26 mmol/L (ref 22–32)
Calcium: 9 mg/dL (ref 8.9–10.3)
Chloride: 102 mmol/L (ref 98–111)
Creatinine, Ser: 0.99 mg/dL (ref 0.44–1.00)
GFR, Estimated: 57 mL/min — ABNORMAL LOW (ref >60)
Glucose, Bld: 114 mg/dL — ABNORMAL HIGH (ref 70–99)
Potassium: 3.9 mmol/L (ref 3.5–5.1)
Sodium: 137 mmol/L (ref 135–145)
Total Bilirubin: 1.3 mg/dL — ABNORMAL HIGH (ref 0.3–1.2)
Total Protein: 6.9 g/dL (ref 6.5–8.1)

## 2020-09-14 LAB — LIPASE, BLOOD: Lipase: 1311 U/L — ABNORMAL HIGH (ref 11–51)

## 2020-09-14 LAB — RESP PANEL BY RT-PCR (FLU A&B, COVID) ARPGX2
Influenza A by PCR: NEGATIVE
Influenza B by PCR: NEGATIVE
SARS Coronavirus 2 by RT PCR: NEGATIVE

## 2020-09-14 MED ORDER — FENTANYL CITRATE (PF) 100 MCG/2ML IJ SOLN
50.0000 ug | Freq: Once | INTRAMUSCULAR | Status: AC | PRN
  Administered 2020-09-14: 50 ug via INTRAVENOUS
  Filled 2020-09-14: qty 2

## 2020-09-14 MED ORDER — HYDROCODONE-ACETAMINOPHEN 5-325 MG PO TABS
1.0000 | ORAL_TABLET | ORAL | Status: DC | PRN
  Administered 2020-09-14 – 2020-09-17 (×8): 2 via ORAL
  Filled 2020-09-14 (×9): qty 2

## 2020-09-14 MED ORDER — ACETAMINOPHEN 325 MG PO TABS
650.0000 mg | ORAL_TABLET | Freq: Four times a day (QID) | ORAL | Status: DC | PRN
  Administered 2020-09-17 – 2020-09-19 (×3): 650 mg via ORAL
  Filled 2020-09-14 (×3): qty 2

## 2020-09-14 MED ORDER — ALBUTEROL SULFATE (2.5 MG/3ML) 0.083% IN NEBU
2.5000 mg | INHALATION_SOLUTION | RESPIRATORY_TRACT | Status: DC | PRN
Start: 2020-09-14 — End: 2020-09-20
  Administered 2020-09-14 – 2020-09-18 (×4): 2.5 mg via RESPIRATORY_TRACT
  Filled 2020-09-14 (×4): qty 3

## 2020-09-14 MED ORDER — ONDANSETRON HCL 4 MG/2ML IJ SOLN
4.0000 mg | Freq: Once | INTRAMUSCULAR | Status: AC
  Administered 2020-09-14: 4 mg via INTRAVENOUS
  Filled 2020-09-14: qty 2

## 2020-09-14 MED ORDER — FENTANYL CITRATE (PF) 100 MCG/2ML IJ SOLN
50.0000 ug | Freq: Once | INTRAMUSCULAR | Status: AC
  Administered 2020-09-14: 50 ug via INTRAVENOUS
  Filled 2020-09-14: qty 2

## 2020-09-14 MED ORDER — FENTANYL CITRATE (PF) 100 MCG/2ML IJ SOLN
50.0000 ug | INTRAMUSCULAR | Status: DC | PRN
  Administered 2020-09-14: 50 ug via INTRAVENOUS
  Filled 2020-09-14: qty 2

## 2020-09-14 MED ORDER — SODIUM CHLORIDE 0.9 % IV SOLN: Freq: Once | INTRAVENOUS | Status: AC

## 2020-09-14 MED ORDER — LACTATED RINGERS IV SOLN: INTRAVENOUS | Status: DC

## 2020-09-14 MED ORDER — MELATONIN 3 MG PO TABS
3.0000 mg | ORAL_TABLET | Freq: Every day | ORAL | Status: DC
  Administered 2020-09-14 – 2020-09-15 (×2): 3 mg via ORAL
  Filled 2020-09-14 (×2): qty 1

## 2020-09-14 MED ORDER — PANTOPRAZOLE SODIUM 40 MG IV SOLR
40.0000 mg | Freq: Two times a day (BID) | INTRAVENOUS | Status: DC
  Administered 2020-09-14 – 2020-09-18 (×9): 40 mg via INTRAVENOUS
  Filled 2020-09-14 (×9): qty 40

## 2020-09-14 MED ORDER — ONDANSETRON HCL 4 MG PO TABS: 4.0000 mg | ORAL_TABLET | Freq: Four times a day (QID) | ORAL | Status: DC | PRN

## 2020-09-14 MED ORDER — HYDROMORPHONE HCL 1 MG/ML IJ SOLN
0.5000 mg | INTRAMUSCULAR | Status: DC | PRN
  Administered 2020-09-14: 0.5 mg via INTRAVENOUS
  Filled 2020-09-14: qty 0.5

## 2020-09-14 MED ORDER — SENNOSIDES-DOCUSATE SODIUM 8.6-50 MG PO TABS: 1.0000 | ORAL_TABLET | Freq: Every evening | ORAL | Status: DC | PRN

## 2020-09-14 MED ORDER — FLUTICASONE PROPIONATE 50 MCG/ACT NA SUSP
2.0000 | Freq: Every day | NASAL | Status: DC
  Administered 2020-09-15 – 2020-09-19 (×5): 2 via NASAL
  Filled 2020-09-14: qty 16

## 2020-09-14 MED ORDER — AMLODIPINE BESYLATE 5 MG PO TABS
5.0000 mg | ORAL_TABLET | Freq: Every day | ORAL | Status: DC
  Administered 2020-09-14 – 2020-09-19 (×3): 5 mg via ORAL
  Filled 2020-09-14 (×5): qty 1

## 2020-09-14 MED ORDER — POLYVINYL ALCOHOL 1.4 % OP SOLN: 2.0000 [drp] | Freq: Every day | OPHTHALMIC | Status: DC | PRN

## 2020-09-14 MED ORDER — BISACODYL 5 MG PO TBEC
5.0000 mg | DELAYED_RELEASE_TABLET | Freq: Every day | ORAL | Status: DC | PRN
  Administered 2020-09-14: 5 mg via ORAL
  Filled 2020-09-14 (×2): qty 1

## 2020-09-14 MED ORDER — METOPROLOL TARTRATE 5 MG/5ML IV SOLN
5.0000 mg | Freq: Four times a day (QID) | INTRAVENOUS | Status: DC | PRN
  Filled 2020-09-14: qty 5

## 2020-09-14 MED ORDER — HYDRALAZINE HCL 50 MG PO TABS
50.0000 mg | ORAL_TABLET | Freq: Three times a day (TID) | ORAL | Status: DC
  Administered 2020-09-14 – 2020-09-19 (×11): 50 mg via ORAL
  Filled 2020-09-14 (×13): qty 1

## 2020-09-14 MED ORDER — IOHEXOL 350 MG/ML SOLN
100.0000 mL | Freq: Once | INTRAVENOUS | Status: AC | PRN
  Administered 2020-09-14: 100 mL via INTRAVENOUS

## 2020-09-14 MED ORDER — GADOBUTROL 1 MMOL/ML IV SOLN
7.0000 mL | Freq: Once | INTRAVENOUS | Status: AC | PRN
  Administered 2020-09-14: 7 mL via INTRAVENOUS

## 2020-09-14 MED ORDER — METOPROLOL TARTRATE 50 MG PO TABS
100.0000 mg | ORAL_TABLET | Freq: Two times a day (BID) | ORAL | Status: DC
  Administered 2020-09-14 – 2020-09-19 (×9): 100 mg via ORAL
  Filled 2020-09-14 (×11): qty 2

## 2020-09-14 MED ORDER — PANTOPRAZOLE SODIUM 40 MG IV SOLR
40.0000 mg | Freq: Once | INTRAVENOUS | Status: AC
  Administered 2020-09-14: 40 mg via INTRAVENOUS
  Filled 2020-09-14: qty 40

## 2020-09-14 MED ORDER — ACETAMINOPHEN 650 MG RE SUPP: 650.0000 mg | Freq: Four times a day (QID) | RECTAL | Status: DC | PRN

## 2020-09-14 MED ORDER — ONDANSETRON HCL 4 MG/2ML IJ SOLN: 4.0000 mg | Freq: Four times a day (QID) | INTRAMUSCULAR | Status: DC | PRN

## 2020-09-14 NOTE — Hospital Course (Signed)
CT abdomen/pelvis: 1. 2.9 x 2.4 cm cystic lesion in the tip of the pancreatic tail with surrounding edema/inflammation the tracts into the splenic hilum. Imaging features are most suggestive of acute pancreatitis with pseudocyst. Cystic pancreatic neoplasm is considered less likely but not excluded. MRI of the abdomen without and with contrast recommended to further evaluate. 2. Apparent dilatation of the main pancreatic duct through the head of the pancreas. This could also be further assessed by MRCP. 3. Hepatic and right renal cysts. 4. Left colonic diverticulosis without diverticulitis. 5. Aortic Atherosclerosis (ICD10-I70.0).

## 2020-09-14 NOTE — ED Notes (Signed)
Patient requesting food and drink. Reinforced causality between ingestion and abdominal pain. Verbalizes understanding.

## 2020-09-14 NOTE — Progress Notes (Signed)
Mobility Specialist - Refusal Note   Cancellation/Refusal: Pt refused mobility at this time due to increased pain levels.   [facility] Phone: [phone] 09/14/20, 3:08 PM

## 2020-09-14 NOTE — Consult Note (Addendum)
Referring Provider:  [facility]         Primary Care Physician:  [name], MD Primary Gastroenterologist:  Scarlette Shorts, MD    Reason for Consultation:  pancreatitis                 ASSESSMENT / PLAN   # 85 yo female with acute pancreatitis ( tail) with possible pseudocyst. Doubt Etoh or biliary source of pancreatitis. Medication related pancreatitis is not excluded ( lipitor, furosemide). Pancreatitis possible secondary to neoplasm . The  2.9 x 2.4 cm cystic lesion in the tip of the pancreatic tail suspected to be a pseudocyst but cystic neoplasm is not excluded.   Triglycerides and total Ca+ normal.  -- Hemodynamically stable. BUN 15 / Cr 0.99. HCT at 3 am was 42.6 %.  --Still having abdominal pain. For now continue NPO. Depending on clinical course we may try clears tomorrow.  --Continue IVF at 125 ml / hr --Continue analgesics --Dr. Loletha Carrow will review CT scan and determine need for more advanced imaging --Patient's son is getting married and she plans to hopefully travel to [facility] Friday morning    # Hx of adenomatous colon polyps. She had an advanced adenoma in 2002, a small adenoma in 2009. In 2015 (last colonoscopy) 3 adenomas removed ranging 5-10 mm in size. She cancelled her surveillance colonoscopy in 2020.  -- Brenda Matthews says she would like to still have her polyp surveillance colonoscopy at some point. She occasionally expediencies very scant rectal bleeding with bowel movements.  --Her candidacy for surveillance colonoscopy can be addressed outpatient when fully recovered from pancreatitis.   # Atrial fibrillation, on Xarelto.     HISTORY OF PRESENT ILLNESS                                                                                                                         Chief Complaint: pancreatitis  Brenda Matthews is a 85 y.o. female with a past medical history significant for adenomatous colon polyps, Afib on Xarelto, chronic diastolic heart failure,  hyperlipidemia, HTN, kidney stones, CKD3. See PMH for additional medical history.    Patient known to Dr. Henrene Pastor for a history of adenomatous colon polyps. She was last seen in the office in 2020. Scheduled at that time for polyp surveillance colonoscopy but later cancelled as her son was in town.   Patient developed generalized lower and mid abdominal pain on Saturday ( two days ago). The pain was intermittent. No associated nausea / vomiting though she tried to make herself vomit to feel better. She continued to have episodes of pain into yesterday. She didn't take anything for the pain. The only thing she had to eat was a croissant which didn't help or exacerbated the pain. She thought the pain was related to constipation. She took two laxatives on Saturday, another yesterday. She hasn't had a BM in a few days though typically doesn't suffer with constipation. Last month she was treated  with two different antibiotics for dental work. She was intolerant of the first antibiotic ( turned me beet red). Other than that she hasn't started any new meds. She drinks only one glass of wine a week. No Clinton of pancreatic diseases .Marland Kitchen    Patient has mild swelling of her right foot where she recently dropped a can of pineapples.    SIGNIFICANT DIAGNOTIC STUDIES    09/13/20 CTAP w/ contrast  IMPRESSION: 1. 2.9 x 2.4 cm cystic lesion in the tip of the pancreatic tail with surrounding edema/inflammation the tracts into the splenic hilum. Imaging features are most suggestive of acute pancreatitis with pseudocyst. Cystic pancreatic neoplasm is considered less likely but not excluded. MRI of the abdomen without and with contrast recommended to further evaluate. 2. Apparent dilatation of the main pancreatic duct through the head of the pancreas. This could also be further assessed by MRCP. 3. Hepatic and right renal cysts. 4. Left colonic diverticulosis without diverticulitis. 5. Aortic Atherosclerosis  (ICD10-I70.0).    PREVIOUS ENDOSCOPIC EVALUATIONS      Past Medical History:  Diagnosis Date   Abnormal EKG approx 2008   Nuclear stress test neg;    Anxiety    with panic   CAP (community acquired pneumonia) 08/2017   Hospitalization for CAP/acute diast HF/rapid a-fib   Cataract    s/p surgery--lens implants   Chronic diastolic heart failure ([place]) 2019   Chronic renal insufficiency, stage 3 (moderate) ([place]) 12/04/2012   Renal u/s when in hosp 08/2017 for CAP/CHF showed symmetric kidneys, echogenicity normal, w/out hydronephrosis. Baseline GFR around 40 ml/min as of 10/2018.   DDD (degenerative disc disease), lumbar    Diverticulosis 2009   Fracture of radial shaft, left, closed 11/16/10   fell down flight of stairs   History of kidney stones    Hx of adenomatous colonic polyps 2002;2009;2015   surveillance colonoscopy 2009, +polypectomy done-tubular adenoma w/out high grade.  05/2013 tubular adenomas--recall 3 yrs   Hyperlipidemia    Hypertension    Low TSH level 02/18/2016   T3 norm, T4 mildly elevated--suspected sick euthyroid syndrome.  Repeat labs 06/2016: normal.   Melanoma in situ ([place]) 06/2018   L LL   Osteoarthritis of both knees    viscosupplementation injections helpful 2020/21   Osteopenia    DEXA 08/2010; repeat DEXA 02/2015 worse: fosamax started.  06/2018 Dexa T score -2.4.  2020 maj osteop fx risk = 24%, Hip fx risk 7.3%. Rpt 2 yrs.   PAF (paroxysmal atrial fibrillation) ([place]) 02/2016   when in post-op for ankle surgery; spontaneously converted in hosp, seen by Dr. Debara Pickett in consultation--metoprolol rate control + xarelto recommended.  Metop d/c due to hypot.  Plan to cont xarelto 20 mg qd indef due to CHAD-VASc score of 3.  A-fib w/RVR and CHF 07/2017; pt placed on amiodarone and plan for CV, but pt was in sinus rhythm when she went in for her DC CV, so she was sent home.   Peripheral edema    Pneumonia 2015   hx with sepsis   Rheumatic fever     Past Surgical  History:  Procedure Laterality Date   APPENDECTOMY  1966   done during surgery for tubal pregnancy   CATARACT EXTRACTION W/ INTRAOCULAR LENS IMPLANT  2013   bilat   COLONOSCOPY W/ POLYPECTOMY  05/2013   +diverticulosis; recall 3 yrs (Dr. Henrene Pastor)   DEXA  02/2015; 06/2018   T score -2.1 in both femoral necks; FRAX 10 yr risk of  major osteoporotic fracture was 21%---fosamax started. 06/2018 T score -2.4.  repeat 1 yr.   ECTOPIC PREGNANCY SURGERY     EYE SURGERY     LEFT HEART CATH AND CORONARY ANGIOGRAPHY N/A 09/01/2017   No angiographically significant CAD.  Upper normal left ventricular filling pressure.  Procedure: LEFT HEART CATH AND CORONARY ANGIOGRAPHY;  Surgeon: Nelva Bush, MD;  Location: [facility];  Service: Cardiovascular;  Laterality: N/A;   LUMBAR LAMINECTOMY/DECOMPRESSION MICRODISCECTOMY N/A 09/26/2018   Procedure: Decompressive Lumbar Laminectomy L5 S1 FORAMINOTOMY L5 S1  NERVE ROOT BILATERALLY and Microdiscectomy L5-S1 Left;  Surgeon: Latanya Maudlin, MD;  Location: [facility];  Service: Orthopedics;  Laterality: N/A;  169mn   OPEN REDUCTION INTERNAL FIXATION (ORIF) TIBIA/FIBULA FRACTURE Left 02/18/2016   Procedure: OPEN REDUCTION INTERNAL FIXATION (ORIF) Right ankle trimalleolar fracture;  Surgeon: JWylene Simmer MD;  [facility]  Service: Orthopedics;  Laterality: Left;  requests 930ms   ORIF RADIAL FRACTURE  11/18/2010   left; s/p slip on slippery floor and fell   THORACENTESIS  08/2017   diagnostic and therapeutic.  Transudative.  Clx neg.  (+pulm edema/diastolic HF)   TONSILLECTOMY     TRANSTHORACIC ECHOCARDIOGRAM  02/18/2016; 08/10/17   LVEF of 55-60%, mild AI and mild MR and normal biatrial size.  07/2017--normal LV function, mild enlarge aortic root, mild/mod TR, bilat atrial enlargement.    Prior to Admission medications   Medication Sig Start Date End Date Taking? Authorizing Provider  acetaminophen (TYLENOL) 650 MG CR tablet Take 650 mg by mouth in the  morning and at bedtime.   Yes [provider]  albuterol (VENTOLIN HFA) 108 (90 Base) MCG/ACT inhaler INHALE 2 PUFFS INTO LUNGS EVERY 4 HOURS AS NEEDED FOR WHEEZING OR SHORTNESS OF BREATH Patient taking differently: Inhale 2 puffs into the lungs every 4 (four) hours as needed for wheezing or shortness of breath. 09/10/20  Yes McGowen, PhAdrian BlackwaterMD  ALPRAZolam (XANAX) 0.5 MG tablet TAKE 1 TABLET BY MOUTH 3 TIMES DAILY AS NEEDED FOR STRESS Patient taking differently: Take 0.5 mg by mouth at bedtime as needed for sleep or anxiety. 07/13/20  Yes McGowen, PhAdrian BlackwaterMD  amLODipine (NORVASC) 5 MG tablet Take 1 tablet (5 mg total) by mouth daily. 02/20/20  Yes McGowen, PhAdrian BlackwaterMD  Apoaequorin (PREVAGEN PO) Take 1 tablet by mouth daily.   Yes [provider]  atorvastatin (LIPITOR) 40 MG tablet Take 1 tablet (40 mg total) by mouth daily. 08/27/20  Yes McGowen, PhAdrian BlackwaterMD  Biotin 5000 MCG TABS Take 5,000 mcg by mouth daily.   Yes [provider]  Calcium Carbonate (CALCIUM 600 PO) Take 1,200 mg by mouth daily.    Yes [provider]  carboxymethylcellulose (REFRESH PLUS) 0.5 % SOLN Place 2 drops into both eyes daily as needed (dry/irritated eyes.).   Yes [provider]  Coenzyme Q10 200 MG capsule Take 200 mg by mouth daily.   Yes [provider]  fluticasone (FLONASE) 50 MCG/ACT nasal spray Place 2 sprays into both nostrils daily.    Yes [provider]  furosemide (LASIX) 40 MG tablet Take 1 tablet (40 mg total) by mouth daily. 08/27/20  Yes McGowen, PhAdrian BlackwaterMD  hydrALAZINE (APRESOLINE) 50 MG tablet Take 1 tablet (50 mg total) by mouth 3 (three) times daily. 08/27/20  Yes McGowen, PhAdrian BlackwaterMD  metoprolol tartrate (LOPRESSOR) 100 MG tablet TAKE 1 TABLET BY MOUTH 2 TIMES DAILY Patient taking differently: Take 100 mg by mouth  2 (two) times daily. 09/08/20  Yes Hilty, Nadean Corwin, MD  Multiple Vitamins-Minerals (AIRBORNE PO) Take 1 tablet by mouth  daily as needed (immune support).   Yes [provider]  Multiple Vitamins-Minerals (CENTRUM SILVER ULTRA WOMENS) TABS Take 1 tablet by mouth every evening.   Yes [provider]  potassium chloride SA (KLOR-CON) 20 MEQ tablet Take 1 tablet (20 mEq total) by mouth 2 (two) times daily. 11/11/19  Yes Hilty, Nadean Corwin, MD  Turmeric 500 MG CAPS Take 500 mg by mouth daily.   Yes [provider]  XARELTO 15 MG TABS tablet TAKE 1 TABLET BY MOUTH EVERY DAY WITH SUPPER Patient taking differently: Take 15 mg by mouth daily. 09/08/20  Yes Hilty, Nadean Corwin, MD  predniSONE (DELTASONE) 10 MG tablet 3 tabs po qd x 3d, then 2 tabs po qd x 3d, then 1 tab po qd x 3d Patient not taking: Reported on 09/14/2020 08/27/20   [name], MD    Current Facility-Administered Medications  Medication Dose Route Frequency Provider Last Rate Last Admin   acetaminophen (TYLENOL) tablet 650 mg  650 mg Oral Q6H PRN Tacey Ruiz, MD       Or   acetaminophen (TYLENOL) suppository 650 mg  650 mg Rectal Q6H PRN Tacey Ruiz, MD       albuterol (PROVENTIL) (2.5 MG/3ML) 0.083% nebulizer solution 2.5 mg  2.5 mg Nebulization Q4H PRN Tacey Ruiz, MD       amLODipine (NORVASC) tablet 5 mg  5 mg Oral Daily Tacey Ruiz, MD       bisacodyl (DULCOLAX) EC tablet 5 mg  5 mg Oral Daily PRN Tacey Ruiz, MD       fluticasone (FLONASE) 50 MCG/ACT nasal spray 2 spray  2 spray Each Nare Daily Tacey Ruiz, MD       hydrALAZINE (APRESOLINE) tablet 50 mg  50 mg Oral TID Tacey Ruiz, MD       HYDROcodone-acetaminophen (NORCO/VICODIN) 5-325 MG per tablet 1-2 tablet  1-2 tablet Oral Q4H PRN Tacey Ruiz, MD       HYDROmorphone (DILAUDID) injection 0.5 mg  0.5 mg Intravenous Q2H PRN Tacey Ruiz, MD   0.5 mg at 09/14/20 1005   lactated ringers infusion   Intravenous Continuous Tacey Ruiz, MD 125 mL/hr at 09/14/20 1010 New Bag at 09/14/20 1010   metoprolol tartrate (LOPRESSOR) injection 5  mg  5 mg Intravenous Q6H PRN Tacey Ruiz, MD       metoprolol tartrate (LOPRESSOR) tablet 100 mg  100 mg Oral BID Tacey Ruiz, MD       ondansetron [facility]) tablet 4 mg  4 mg Oral Q6H PRN Tacey Ruiz, MD       Or   ondansetron (ZOFRAN) injection 4 mg  4 mg Intravenous Q6H PRN Tacey Ruiz, MD       pantoprazole (PROTONIX) injection 40 mg  40 mg Intravenous Q12H Tacey Ruiz, MD       polyvinyl alcohol (LIQUIFILM TEARS) 1.4 % ophthalmic solution 2 drop  2 drop Both Eyes Daily PRN Tacey Ruiz, MD       senna-docusate (Senokot-S) tablet 1 tablet  1 tablet Oral QHS PRN Tacey Ruiz, MD        Allergies as of 09/14/2020 - Review Complete 09/14/2020  Allergen Reaction Noted   Augmentin [amoxicillin-pot clavulanate] Nausea And Vomiting and Other (See Comments) 10/01/2014   Amoxicillin Rash 08/27/2020   Clindamycin/lincomycin Rash 08/27/2020    Family History  Problem Relation Age of Onset  Heart disease Mother    Heart disease Father    Hypertension Brother    Diabetes Sister    Colon cancer Neg Hx    Pancreatic cancer Neg Hx    Rectal cancer Neg Hx    Stomach cancer Neg Hx     Social History   Socioeconomic History   Marital status: Widowed    Spouse name: Not on file   Number of children: Not on file   Years of education: Not on file   Highest education level: Not on file  Occupational History   Not on file  Tobacco Use   Smoking status: Never   Smokeless tobacco: Never  Vaping Use   Vaping Use: Never used  Substance and Sexual Activity   Alcohol use: Yes    Comment: rarely   Drug use: Never   Sexual activity: Not on file  Other Topics Concern   Not on file  Social History Narrative   Widow, 2 sons.   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   No tobacco.  Rare alcohol.   No drugs.  Exercise: 4 times per week, about 101m.   Social Determinants of Health   Financial Resource Strain: Not on file  Food Insecurity: Not on file  Transportation Needs:  Not on file  Physical Activity: Not on file  Stress: Not on file  Social Connections: Not on file  Intimate Partner Violence: Not on file    Review of Systems: All systems reviewed and negative except where noted in HPI.   OBJECTIVE    Physical Exam: Vital signs in last 24 hours: Temp:  [98.4 F (36.9 C)-99.5 F (37.5 C)] 98.4 F (36.9 C) (08/29 0900) Pulse Rate:  [63-76] 74 (08/29 0900) Resp:  [16-33] 16 (08/29 0900) BP: (115-160)/(61-94) 131/61 (08/29 0900) SpO2:  [93 %-100 %] 97 % (08/29 0900) Weight:  [72.6 kg] 72.6 kg (08/29 0158) Last BM Date: 09/11/20 General:   Alert  female in NAD Psych:  Pleasant, cooperative. Normal mood and affect. Eyes:  Pupils equal, sclera clear, no icterus.   Conjunctiva pink. Ears:  Normal auditory acuity. Nose:  No deformity, discharge,  or lesions. Neck:  Supple; no masses Lungs:  Clear throughout to auscultation.   No wheezes, crackles, or rhonchi.  Heart:  Regular rate. Right foot / great toe mildly swollen. Minor brusing to second right toe.  Abdomen:  Soft, non-distended, mild mid abdominal tenderness.  Hypoactive BS active, no palp mass   Rectal:  Deferred  Msk:  Symmetrical without gross deformities. . Neurologic:  Alert and  oriented x4;  grossly normal neurologically. Skin:  Intact without significant lesions or rashes.   Scheduled inpatient medications  amLODipine  5 mg Oral Daily   fluticasone  2 spray Each Nare Daily   hydrALAZINE  50 mg Oral TID   metoprolol tartrate  100 mg Oral BID   pantoprazole (PROTONIX) IV  40 mg Intravenous Q12H      Intake/Output from previous day: No intake/output data recorded. Intake/Output this shift: No intake/output data recorded.   Lab Results: Recent Labs    09/14/20 0316  WBC 15.4*  HGB 13.6  HCT 42.6  PLT 207   BMET Recent Labs    09/14/20 0316  NA 137  K 3.9  CL 102  CO2 26  GLUCOSE 114*  BUN 15  CREATININE 0.99  CALCIUM 9.0   LFT Recent Labs     09/14/20 0316  PROT 6.9  ALBUMIN 3.5  AST 19  ALT 15  ALKPHOS 81  BILITOT 1.3*   PT/INR No results for input(s): LABPROT, INR in the last 72 hours. Hepatitis Panel No results for input(s): HEPBSAG, HCVAB, HEPAIGM, HEPBIGM in the last 72 hours.   . CBC Latest Ref Rng & Units 09/14/2020 08/27/2020 07/26/2019  WBC 4.0 - 10.5 K/uL 15.4(H) 12.2(H) 5.4  Hemoglobin 12.0 - 15.0 g/dL 13.6 12.9 12.6  Hematocrit 36.0 - 46.0 % 42.6 40.5 38.6  Platelets 150 - 400 K/uL 207 259.0 189.0    . CMP Latest Ref Rng & Units 09/14/2020 08/27/2020 05/21/2020  Glucose 70 - 99 mg/dL 114(H) 100(H) 108(H)  BUN 8 - 23 mg/dL 15 28(H) 31(H)  Creatinine 0.44 - 1.00 mg/dL 0.99 1.34(H) 1.19  Sodium 135 - 145 mmol/L 137 142 141  Potassium 3.5 - 5.1 mmol/L 3.9 4.0 4.2  Chloride 98 - 111 mmol/L 102 106 105  CO2 22 - 32 mmol/L 26 27 28   Calcium 8.9 - 10.3 mg/dL 9.0 9.0 10.0  Total Protein 6.5 - 8.1 g/dL 6.9 6.3 -  Total Bilirubin 0.3 - 1.2 mg/dL 1.3(H) 0.8 -  Alkaline Phos 38 - 126 U/L 81 80 -  AST 15 - 41 U/L 19 14 -  ALT 0 - 44 U/L 15 13 -   Studies/Results: CT ABDOMEN PELVIS W CONTRAST  Result Date: 09/14/2020 CLINICAL DATA:  Abdominal pain for 2 days. Bowel obstruction suspected. EXAM: CT ABDOMEN AND PELVIS WITH CONTRAST TECHNIQUE: Multidetector CT imaging of the abdomen and pelvis was performed using the standard protocol following bolus administration of intravenous contrast. CONTRAST:  185m OMNIPAQUE IOHEXOL 350 MG/ML SOLN COMPARISON:  None. FINDINGS: Lower chest: Subsegmental atelectasis noted in the lung bases. Hepatobiliary: 5.7 cm homogeneous water density lesion in the posterior right liver is compatible with a cyst. 2 cm cyst noted in the anterior hepatic dome. Other scattered smaller hypoattenuating liver lesions are too small to characterize but likely cysts. There is no evidence for gallstones, gallbladder wall thickening, or pericholecystic fluid. No intrahepatic or extrahepatic biliary dilation.  Pancreas: Diffuse atrophy of pancreatic parenchyma evident with dilatation of the main pancreatic duct in the head of pancreas. 2.9 x 2.4 cm cystic lesion identified in the tip of the pancreatic tail with surrounding edema/inflammation the tracts into the splenic hilum. Spleen: Trace perisplenic fluid evident. Adrenals/Urinary Tract: No adrenal nodule or mass. 1.9 cm water density lesion in the interpolar right kidney is compatible with a cyst cortical scarring noted in both kidneys. No evidence for hydroureter. The urinary bladder appears normal for the degree of distention. Stomach/Bowel: Stomach is decompressed. Duodenum is normally positioned as is the ligament of Treitz. No small bowel wall thickening. No small bowel dilatation. The terminal ileum is normal. The appendix is not well visualized, but there is no edema or inflammation in the region of the cecum. No gross colonic mass. No colonic wall thickening. Diverticular changes are noted in the left colon without evidence of diverticulitis. Vascular/Lymphatic: There is moderate atherosclerotic calcification of the abdominal aorta without aneurysm. There is no gastrohepatic or hepatoduodenal ligament lymphadenopathy. No retroperitoneal or mesenteric lymphadenopathy. No pelvic sidewall lymphadenopathy. Reproductive: Unremarkable. Other: No free fluid in the pelvis. Musculoskeletal: No worrisome lytic or sclerotic osseous abnormality. Bilateral pars interarticularis defects noted at L5. Diffuse degenerative disc disease noted lumbar spine. IMPRESSION: 1. 2.9 x 2.4 cm cystic lesion in the tip of the pancreatic tail with surrounding edema/inflammation the tracts into the splenic hilum. Imaging features are most suggestive of acute pancreatitis with pseudocyst. Cystic  pancreatic neoplasm is considered less likely but not excluded. MRI of the abdomen without and with contrast recommended to further evaluate. 2. Apparent dilatation of the main pancreatic duct through  the head of the pancreas. This could also be further assessed by MRCP. 3. Hepatic and right renal cysts. 4. Left colonic diverticulosis without diverticulitis. 5. Aortic Atherosclerosis (ICD10-I70.0). Electronically Signed   By: Misty Stanley M.D.   On: 09/14/2020 05:12    Active Problems:   Acute pancreatitis    Tye Savoy, NP-C @  09/14/2020, 12:54 PM  I have reviewed the entire case in detail with the above APP and discussed the plan in detail.  Therefore, I agree with the diagnoses recorded above. In addition,  I have personally interviewed and examined the patient and have personally reviewed any abdominal/pelvic CT scan images.  My additional thoughts are as follows:  85 year old woman with her first episode of pancreatitis, cause unknown.  It is not related to alcohol or biliary cause.  She has a chronic stable list of outpatient medications, so this seems like an unlikely cause.  The cystic lesion of the tail the pancreas causing inflammatory reaction of the splenic hilum appears to be of uniform density, and could be leading to acute pancreatitis as suggested by radiology.  However, the onset of symptoms 2 days ago would make it too soon for a true pseudocyst.  This raises suspicion of underlying cystic neoplasm.  Plan is for bowel rest, pain control and IV fluid support.  We will arrange an MRI of the pancreas and MRCP for further evaluation of the cystic neoplasm and to rule out any obstructing lesion of the proximal pancreas or ampulla.  The diffuse dilatation of the pancreatic duct seems more likely related to diffuse pancreatic atrophy rather than a distal PD obstruction.  Reevaluate patient tomorrow, at that point we will decide about possibly starting liquid diet, and will get repeat lipase tomorrow.  Nelida Meuse III Office:[phone]

## 2020-09-14 NOTE — ED Provider Notes (Signed)
[facility] Provider Note: Georgena Spurling, MD, FACEP  CSN: [id number] MRN: [id number] ARRIVAL: 09/14/20 at [place]: [id number]   CHIEF COMPLAINT  Constipation   HISTORY OF PRESENT ILLNESS  09/14/20 2:43 AM Brenda Matthews is a 85 y.o. female with 2 days of constipation with abdominal pain.  She describes the abdominal pain as diffuse but especially intense in the left lower quadrant.  It is worse with movement.  She rates her pain as a 10 out of 10 and it has both dull and sharp components.  She denies nausea or vomiting.  She has not gotten relief with laxatives.   Past Medical History:  Diagnosis Date   Abnormal EKG approx 2008   Nuclear stress test neg;    Anxiety    with panic   CAP (community acquired pneumonia) 08/2017   Hospitalization for CAP/acute diast HF/rapid a-fib   Cataract    s/p surgery--lens implants   Chronic diastolic heart failure ([place]) 2019   Chronic renal insufficiency, stage 3 (moderate) ([place]) 12/04/2012   Renal u/s when in hosp 08/2017 for CAP/CHF showed symmetric kidneys, echogenicity normal, w/out hydronephrosis. Baseline GFR around 40 ml/min as of 10/2018.   DDD (degenerative disc disease), lumbar    Diverticulosis 2009   Fracture of radial shaft, left, closed 11/16/10   fell down flight of stairs   History of kidney stones    Hx of adenomatous colonic polyps 2002;2009;2015   surveillance colonoscopy 2009, +polypectomy done-tubular adenoma w/out high grade.  05/2013 tubular adenomas--recall 3 yrs   Hyperlipidemia    Hypertension    Low TSH level 02/18/2016   T3 norm, T4 mildly elevated--suspected sick euthyroid syndrome.  Repeat labs 06/2016: normal.   Melanoma in situ ([place]) 06/2018   L LL   Osteoarthritis of both knees    viscosupplementation injections helpful 2020/21   Osteopenia    DEXA 08/2010; repeat DEXA 02/2015 worse: fosamax started.  06/2018 Dexa T score -2.4.  2020 maj osteop fx risk = 24%, Hip fx risk 7.3%. Rpt 2 yrs.   PAF  (paroxysmal atrial fibrillation) ([place]) 02/2016   when in post-op for ankle surgery; spontaneously converted in hosp, seen by Dr. Debara Pickett in consultation--metoprolol rate control + xarelto recommended.  Metop d/c due to hypot.  Plan to cont xarelto 20 mg qd indef due to CHAD-VASc score of 3.  A-fib w/RVR and CHF 07/2017; pt placed on amiodarone and plan for CV, but pt was in sinus rhythm when she went in for her DC CV, so she was sent home.   Peripheral edema    Pneumonia 2015   hx with sepsis   Rheumatic fever     Past Surgical History:  Procedure Laterality Date   APPENDECTOMY  1966   done during surgery for tubal pregnancy   CATARACT EXTRACTION W/ INTRAOCULAR LENS IMPLANT  2013   bilat   COLONOSCOPY W/ POLYPECTOMY  05/2013   +diverticulosis; recall 3 yrs (Dr. Henrene Pastor)   DEXA  02/2015; 06/2018   T score -2.1 in both femoral necks; FRAX 10 yr risk of major osteoporotic fracture was 21%---fosamax started. 06/2018 T score -2.4.  repeat 1 yr.   ECTOPIC PREGNANCY SURGERY     EYE SURGERY     LEFT HEART CATH AND CORONARY ANGIOGRAPHY N/A 09/01/2017   No angiographically significant CAD.  Upper normal left ventricular filling pressure.  Procedure: LEFT HEART CATH AND CORONARY ANGIOGRAPHY;  Surgeon: Nelva Bush, MD;  Location: [facility];  Service:  Cardiovascular;  Laterality: N/A;   LUMBAR LAMINECTOMY/DECOMPRESSION MICRODISCECTOMY N/A 09/26/2018   Procedure: Decompressive Lumbar Laminectomy L5 S1 FORAMINOTOMY L5 S1  NERVE ROOT BILATERALLY and Microdiscectomy L5-S1 Left;  Surgeon: Latanya Maudlin, MD;  Location: [facility];  Service: Orthopedics;  Laterality: N/A;  160mn   OPEN REDUCTION INTERNAL FIXATION (ORIF) TIBIA/FIBULA FRACTURE Left 02/18/2016   Procedure: OPEN REDUCTION INTERNAL FIXATION (ORIF) Right ankle trimalleolar fracture;  Surgeon: JWylene Simmer MD;  [facility]  Service: Orthopedics;  Laterality: Left;  requests 984ms   ORIF RADIAL FRACTURE  11/18/2010   left; s/p slip on  slippery floor and fell   THORACENTESIS  08/2017   diagnostic and therapeutic.  Transudative.  Clx neg.  (+pulm edema/diastolic HF)   TONSILLECTOMY     TRANSTHORACIC ECHOCARDIOGRAM  02/18/2016; 08/10/17   LVEF of 55-60%, mild AI and mild MR and normal biatrial size.  07/2017--normal LV function, mild enlarge aortic root, mild/mod TR, bilat atrial enlargement.    Family History  Problem Relation Age of Onset   Heart disease Mother    Heart disease Father    Hypertension Brother    Diabetes Sister    Colon cancer Neg Hx    Pancreatic cancer Neg Hx    Rectal cancer Neg Hx    Stomach cancer Neg Hx     Social History   Tobacco Use   Smoking status: Never   Smokeless tobacco: Never  Vaping Use   Vaping Use: Never used  Substance Use Topics   Alcohol use: Yes    Comment: rarely   Drug use: No    Prior to Admission medications   Medication Sig Start Date End Date Taking? Authorizing Provider  Acetaminophen (TYLENOL 8 HOUR ARTHRITIS PAIN PO) Take by mouth in the morning and at bedtime.    [provider]  albuterol (VENTOLIN HFA) 108 (90 Base) MCG/ACT inhaler INHALE 2 PUFFS INTO LUNGS EVERY 4 HOURS AS NEEDED FOR WHEEZING OR SHORTNESS OF BREATH 09/10/20   McGowen, PhAdrian BlackwaterMD  ALPRAZolam (XDuanne Moron0.5 MG tablet TAKE 1 TABLET BY MOUTH 3 TIMES DAILY AS NEEDED FOR STRESS 07/13/20   McGowen, PhAdrian BlackwaterMD  amLODipine (NORVASC) 5 MG tablet Take 1 tablet (5 mg total) by mouth daily. 02/20/20   McGowen, PhAdrian BlackwaterMD  Apoaequorin (PREVAGEN PO) Take 1 tablet by mouth daily.    [provider]  atorvastatin (LIPITOR) 40 MG tablet Take 1 tablet (40 mg total) by mouth daily. 08/27/20   McGowen, PhAdrian BlackwaterMD  Biotin 5000 MCG TABS Take 5,000 mcg by mouth daily.    [provider]  Calcium Carbonate (CALCIUM 600 PO) Take 1,200 mg by mouth daily.     [provider]  carboxymethylcellulose (REFRESH PLUS) 0.5 % SOLN Place 2 drops into both eyes daily as needed  (dry/irritated eyes.).    [provider]  Coenzyme Q10 200 MG capsule Take 200 mg by mouth daily.    [provider]  fluticasone (FLONASE) 50 MCG/ACT nasal spray Place 2 sprays into both nostrils daily.     [provider]  furosemide (LASIX) 40 MG tablet Take 1 tablet (40 mg total) by mouth daily. 08/27/20   McGowen, PhAdrian BlackwaterMD  hydrALAZINE (APRESOLINE) 50 MG tablet Take 1 tablet (50 mg total) by mouth 3 (three) times daily. 08/27/20   McGowen, PhAdrian BlackwaterMD  metoprolol tartrate (LOPRESSOR) 100 MG tablet TAKE 1 TABLET BY MOUTH 2 TIMES DAILY 09/08/20   Hilty, KeNadean CorwinMD  Multiple Vitamins-Minerals (AIRBORNE PO) Take 1 tablet by mouth daily as needed (immune support).    [provider]  Multiple Vitamins-Minerals (CENTRUM SILVER ULTRA WOMENS) TABS Take 1 tablet by mouth every evening.    [provider]  potassium chloride SA (KLOR-CON) 20 MEQ tablet Take 1 tablet (20 mEq total) by mouth 2 (two) times daily. 11/11/19   Hilty, Nadean Corwin, MD  predniSONE (DELTASONE) 10 MG tablet 3 tabs po qd x 3d, then 2 tabs po qd x 3d, then 1 tab po qd x 3d 08/27/20   McGowen, Adrian Blackwater, MD  Turmeric 500 MG CAPS Take 500 mg by mouth daily.    [provider]  XARELTO 15 MG TABS tablet TAKE 1 TABLET BY MOUTH EVERY DAY WITH SUPPER 09/08/20   Hilty, Nadean Corwin, MD    Allergies Augmentin [amoxicillin-pot clavulanate], Amoxicillin, and Clindamycin/lincomycin   REVIEW OF SYSTEMS  Negative except as noted here or in the History of Present Illness.   PHYSICAL EXAMINATION  Initial Vital Signs Blood pressure (!) 160/75, pulse 76, temperature 99.5 F (37.5 C), temperature source Oral, resp. rate 18, height 5' 1"  (1.549 m), weight 72.6 kg, SpO2 96 %.  Examination General: Well-developed, well-nourished female in no acute distress; appearance consistent with age of record HENT: normocephalic; atraumatic Eyes: pupils equal, round and reactive to light Neck:  supple Heart: regular rate and rhythm Lungs: clear to auscultation bilaterally Abdomen: soft; distended; diffusely tender; bowel sounds present Rectal: Normal sphincter tone; surface of rectal wall is irregular; no formed stool in vault Extremities: No deformity; full range of motion; pulses normal Neurologic: Awake, alert; motor function intact in all extremities and symmetric; no facial droop Skin: Warm and dry Psychiatric: Grimacing   RESULTS  Summary of this visit's results, reviewed and interpreted by myself:   EKG Interpretation  Date/Time:    Ventricular Rate:    PR Interval:    QRS Duration:   QT Interval:    QTC Calculation:   R Axis:     Text Interpretation:         Laboratory Studies: Results for orders placed or performed during the hospital encounter of 09/14/20 (from the past 24 hour(s))  CBC with Differential/Platelet     Status: Abnormal   Collection Time: 09/14/20  3:16 AM  Result Value Ref Range   WBC 15.4 (H) 4.0 - 10.5 K/uL   RBC 4.61 3.87 - 5.11 MIL/uL   Hemoglobin 13.6 12.0 - 15.0 g/dL   HCT 42.6 36.0 - 46.0 %   MCV 92.4 80.0 - 100.0 fL   MCH 29.5 26.0 - 34.0 pg   MCHC 31.9 30.0 - 36.0 g/dL   RDW 15.0 11.5 - 15.5 %   Platelets 207 150 - 400 K/uL   nRBC 0.0 0.0 - 0.2 %   Neutrophils Relative % 83 %   Neutro Abs 12.8 (H) 1.7 - 7.7 K/uL   Lymphocytes Relative 7 %   Lymphs Abs 1.0 0.7 - 4.0 K/uL   Monocytes Relative 9 %   Monocytes Absolute 1.4 (H) 0.1 - 1.0 K/uL   Eosinophils Relative 0 %   Eosinophils Absolute 0.0 0.0 - 0.5 K/uL   Basophils Relative 0 %   Basophils Absolute 0.0 0.0 - 0.1 K/uL   Immature Granulocytes 1 %   Abs Immature Granulocytes 0.07 0.00 - 0.07 K/uL  Comprehensive metabolic panel     Status: Abnormal   Collection Time: 09/14/20  3:16 AM  Result Value Ref Range  Sodium 137 135 - 145 mmol/L   Potassium 3.9 3.5 - 5.1 mmol/L   Chloride 102 98 - 111 mmol/L   CO2 26 22 - 32 mmol/L   Glucose, Bld 114 (H) 70 - 99 mg/dL    BUN 15 8 - 23 mg/dL   Creatinine, Ser 0.99 0.44 - 1.00 mg/dL   Calcium 9.0 8.9 - 10.3 mg/dL   Total Protein 6.9 6.5 - 8.1 g/dL   Albumin 3.5 3.5 - 5.0 g/dL   AST 19 15 - 41 U/L   ALT 15 0 - 44 U/L   Alkaline Phosphatase 81 38 - 126 U/L   Total Bilirubin 1.3 (H) 0.3 - 1.2 mg/dL   GFR, Estimated 57 (L) >60 mL/min   Anion gap 9 5 - 15  Lipase, blood     Status: Abnormal   Collection Time: 09/14/20  4:46 AM  Result Value Ref Range   Lipase 1,311 (H) 11 - 51 U/L   Imaging Studies: CT ABDOMEN PELVIS W CONTRAST  Result Date: 09/14/2020 CLINICAL DATA:  Abdominal pain for 2 days. Bowel obstruction suspected. EXAM: CT ABDOMEN AND PELVIS WITH CONTRAST TECHNIQUE: Multidetector CT imaging of the abdomen and pelvis was performed using the standard protocol following bolus administration of intravenous contrast. CONTRAST:  187m OMNIPAQUE IOHEXOL 350 MG/ML SOLN COMPARISON:  None. FINDINGS: Lower chest: Subsegmental atelectasis noted in the lung bases. Hepatobiliary: 5.7 cm homogeneous water density lesion in the posterior right liver is compatible with a cyst. 2 cm cyst noted in the anterior hepatic dome. Other scattered smaller hypoattenuating liver lesions are too small to characterize but likely cysts. There is no evidence for gallstones, gallbladder wall thickening, or pericholecystic fluid. No intrahepatic or extrahepatic biliary dilation. Pancreas: Diffuse atrophy of pancreatic parenchyma evident with dilatation of the main pancreatic duct in the head of pancreas. 2.9 x 2.4 cm cystic lesion identified in the tip of the pancreatic tail with surrounding edema/inflammation the tracts into the splenic hilum. Spleen: Trace perisplenic fluid evident. Adrenals/Urinary Tract: No adrenal nodule or mass. 1.9 cm water density lesion in the interpolar right kidney is compatible with a cyst cortical scarring noted in both kidneys. No evidence for hydroureter. The urinary bladder appears normal for the degree of  distention. Stomach/Bowel: Stomach is decompressed. Duodenum is normally positioned as is the ligament of Treitz. No small bowel wall thickening. No small bowel dilatation. The terminal ileum is normal. The appendix is not well visualized, but there is no edema or inflammation in the region of the cecum. No gross colonic mass. No colonic wall thickening. Diverticular changes are noted in the left colon without evidence of diverticulitis. Vascular/Lymphatic: There is moderate atherosclerotic calcification of the abdominal aorta without aneurysm. There is no gastrohepatic or hepatoduodenal ligament lymphadenopathy. No retroperitoneal or mesenteric lymphadenopathy. No pelvic sidewall lymphadenopathy. Reproductive: Unremarkable. Other: No free fluid in the pelvis. Musculoskeletal: No worrisome lytic or sclerotic osseous abnormality. Bilateral pars interarticularis defects noted at L5. Diffuse degenerative disc disease noted lumbar spine. IMPRESSION: 1. 2.9 x 2.4 cm cystic lesion in the tip of the pancreatic tail with surrounding edema/inflammation the tracts into the splenic hilum. Imaging features are most suggestive of acute pancreatitis with pseudocyst. Cystic pancreatic neoplasm is considered less likely but not excluded. MRI of the abdomen without and with contrast recommended to further evaluate. 2. Apparent dilatation of the main pancreatic duct through the head of the pancreas. This could also be further assessed by MRCP. 3. Hepatic and right renal cysts. 4. Left colonic  diverticulosis without diverticulitis. 5. Aortic Atherosclerosis (ICD10-I70.0). Electronically Signed   By: Misty Stanley M.D.   On: 09/14/2020 05:12    ED COURSE and MDM  Nursing notes, initial and subsequent vitals signs, including pulse oximetry, reviewed and interpreted by myself.  Vitals:   09/14/20 0330 09/14/20 0400 09/14/20 0430 09/14/20 0500  BP: (!) 142/94 123/63 136/66 133/71  Pulse:  64 72 70  Resp: (!) 33 19 (!) 29 (!) 25   Temp:      TempSrc:      SpO2: 99% 100% 97% 94%  Weight:      Height:       Medications  ondansetron (ZOFRAN) injection 4 mg (4 mg Intravenous Given 09/14/20 0321)  fentaNYL (SUBLIMAZE) injection 50 mcg (50 mcg Intravenous Given 09/14/20 0323)  fentaNYL (SUBLIMAZE) injection 50 mcg (50 mcg Intravenous Given 09/14/20 0510)  iohexol (OMNIPAQUE) 350 MG/ML injection 100 mL (100 mLs Intravenous Contrast Given 09/14/20 0406)  0.9 %  sodium chloride infusion ( Intravenous New Bag/Given 09/14/20 0457)   5:37 AM Patient's pain well controlled at this time.  She was advised of diagnosis of acute pancreatitis.  She denies any significant alcohol use or history of pancreatitis in the past.  5:59 AM Dr. Myna Hidalgo accepts for admission to the hospitalist service.  PROCEDURES  Procedures   ED DIAGNOSES     ICD-10-CM   1. Idiopathic acute pancreatitis, unspecified complication status  Z61.09     2. Pancreatic pseudocyst  K86.3          Brenda Matthews, Brenda Reichmann, MD 09/14/20 [phone]

## 2020-09-14 NOTE — ED Triage Notes (Signed)
Constipation with abdominal pain x 2 days. No relief with laxatives.

## 2020-09-14 NOTE — H&P (Signed)
History and Physical    Brenda Matthews:[id number] DOB: Nov 15, 1935 DOA: 09/14/2020  PCP: [name], MD  Patient coming from: Home Cardiologist: Dr. Debara Pickett  I have personally briefly reviewed patient's old medical records in [facility].  Chief Complaint: Abdominal pain and constipation  Leaving Friday morning 09/18/20 at 7 AM for [place], [place] to go to her son's wedding.   HPI: Brenda Matthews is a 85 y.o. female with medical history significant for atrial fibrillation on Xarelto, chronic diastolic congestive heart failure (EF 50-55%), hypertension, hyperlipidemia, degenerative disc disease, remote history appendectomy, who presents to the emergency department on 09/14/2020 with abdominal pain and constipation. Onset of patient's symptoms was 09/12/2020 and duration is intermittent; symptoms began several hours after she had a lettuce wrap at [employer] for lunch. She tried drinking sparkling water but it did not help.  Pain is located throughout the entire abdomen and does not radiate; initially the pain was worse in the  LLQ but on admission it is generalized pain that is up to 10/10 at times and characterized as sharp. Symptoms are alleviated by nothing and exacerbated by nothing. Associated symptoms: No nausea, and was unable to vomit. She reports constipation with last BM 09/12/20 and was normal. No blody stool or black tarry stool.  No chest pain or palpitations.  No fever or chills.  Treatments: Patient tried laxatives at home but did not get any relief.  She reports no history of pancreatitis. Root canal June 2022 and has intolerance to the antibiotic she was given that results in severe vomiting; she developed hives and vomiting; that was the only new medication recently but she finished that in June. No history of alcohol abuse. Treatments: Patient reports she takes Xarelto in the mornings and in the early morning around 1 AM on 09/14/2020 she took her Xarelto and other morning  medications early before going to the emergency department because she was not sure when she would be able to take them again. Patient history: She takes Xarelto for atrial fibrillation. She has no history of PE or DVT.  ED Course: Labs include lipase 1311, normal liver function tests except total bilirubin minimally elevated at 1.3, WBCs 15.4.  Patient was given ondansetron, IV fentanyl, and IV normal saline infusion.  CT abdomen and pelvis suggestive of acute pancreatitis with pancreatic pseudocyst, although cystic pancreatic neoplasm is not excluded; dilatation of the main pancreatic duct, among other findings.  History:  Colonoscopy 05/30/2013 by [facility], Dr. Irene Shipper, Jr: ENDOSCOPIC IMPRESSION: 1.   Three polyps were found in the ascending colon and transverse colon; polypectomy was performed with a cold snare 2.   Severe diverticulosis was noted in the left colon 3.   The colon mucosa was otherwise normal RECOMMENDATIONS: 1. Repeat Colonoscopy in 3 years, if medically fit.  Has never had stents placed in heart.   Echocardiogram, 08/10/2017: Impressions:  EF 50-55% - Normal LV systolic function; mild LVH; trace AI; mildly dilated    aortic root; mild MR; biatrial enlargement; mild to moderate TR.    Review of Systems: As per HPI otherwise all other systems reviewed and are unremarable.  No cough, wheezing, or shortness of breath.  No headache or focal weakness.  Has recurrent rashes on her skin with no recent change.  No new lesions.    Past Medical History:  Diagnosis Date   Abnormal EKG approx 2008   Nuclear stress test neg;    Anxiety  with panic   CAP (community acquired pneumonia) 08/2017   Hospitalization for CAP/acute diast HF/rapid a-fib   Cataract    s/p surgery--lens implants   Chronic diastolic heart failure ([place]) 2019   Chronic renal insufficiency, stage 3 (moderate) ([place]) 12/04/2012   Renal u/s when in hosp 08/2017 for CAP/CHF showed symmetric  kidneys, echogenicity normal, w/out hydronephrosis. Baseline GFR around 40 ml/min as of 10/2018.   DDD (degenerative disc disease), lumbar    Diverticulosis 2009   Fracture of radial shaft, left, closed 11/16/10   fell down flight of stairs   History of kidney stones    Hx of adenomatous colonic polyps 2002;2009;2015   surveillance colonoscopy 2009, +polypectomy done-tubular adenoma w/out high grade.  05/2013 tubular adenomas--recall 3 yrs   Hyperlipidemia    Hypertension    Low TSH level 02/18/2016   T3 norm, T4 mildly elevated--suspected sick euthyroid syndrome.  Repeat labs 06/2016: normal.   Melanoma in situ ([place]) 06/2018   L LL   Osteoarthritis of both knees    viscosupplementation injections helpful 2020/21   Osteopenia    DEXA 08/2010; repeat DEXA 02/2015 worse: fosamax started.  06/2018 Dexa T score -2.4.  2020 maj osteop fx risk = 24%, Hip fx risk 7.3%. Rpt 2 yrs.   PAF (paroxysmal atrial fibrillation) ([place]) 02/2016   when in post-op for ankle surgery; spontaneously converted in hosp, seen by Dr. Debara Pickett in consultation--metoprolol rate control + xarelto recommended.  Metop d/c due to hypot.  Plan to cont xarelto 20 mg qd indef due to CHAD-VASc score of 3.  A-fib w/RVR and CHF 07/2017; pt placed on amiodarone and plan for CV, but pt was in sinus rhythm when she went in for her [place] CV, so she was sent home.   Peripheral edema    Pneumonia 2015   hx with sepsis   Rheumatic fever     Past Surgical History:  Procedure Laterality Date   APPENDECTOMY  1966   done during surgery for tubal pregnancy   CATARACT EXTRACTION W/ INTRAOCULAR LENS IMPLANT  2013   bilat   COLONOSCOPY W/ POLYPECTOMY  05/2013   +diverticulosis; recall 3 yrs (Dr. Henrene Pastor)   DEXA  02/2015; 06/2018   T score -2.1 in both femoral necks; FRAX 10 yr risk of major osteoporotic fracture was 21%---fosamax started. 06/2018 T score -2.4.  repeat 1 yr.   ECTOPIC PREGNANCY SURGERY     EYE SURGERY     LEFT HEART CATH AND CORONARY  ANGIOGRAPHY N/A 09/01/2017   No angiographically significant CAD.  Upper normal left ventricular filling pressure.  Procedure: LEFT HEART CATH AND CORONARY ANGIOGRAPHY;  Surgeon: Nelva Bush, MD;  Location: [facility];  Service: Cardiovascular;  Laterality: N/A;   LUMBAR LAMINECTOMY/DECOMPRESSION MICRODISCECTOMY N/A 09/26/2018   Procedure: Decompressive Lumbar Laminectomy L5 S1 FORAMINOTOMY L5 S1  NERVE ROOT BILATERALLY and Microdiscectomy L5-S1 Left;  Surgeon: Latanya Maudlin, MD;  Location: [facility];  Service: Orthopedics;  Laterality: N/A;  114mn   OPEN REDUCTION INTERNAL FIXATION (ORIF) TIBIA/FIBULA FRACTURE Left 02/18/2016   Procedure: OPEN REDUCTION INTERNAL FIXATION (ORIF) Right ankle trimalleolar fracture;  Surgeon: JWylene Simmer MD;  Location: [facility]  Service: Orthopedics;  Laterality: Left;  requests [phone]   ORIF RADIAL FRACTURE  11/18/2010   left; s/p slip on slippery floor and fell   THORACENTESIS  08/2017   diagnostic and therapeutic.  Transudative.  Clx neg.  (+pulm edema/diastolic HF)   TONSILLECTOMY     TRANSTHORACIC ECHOCARDIOGRAM  02/18/2016;  08/10/17   LVEF of 55-60%, mild AI and mild MR and normal biatrial size.  07/2017--normal LV function, mild enlarge aortic root, mild/mod TR, bilat atrial enlargement.    Social History  reports that she has never smoked. She has never used smokeless tobacco. She reports current alcohol use. She reports that she does not use drugs.  1 glass of wine per week.   Allergies  Allergen Reactions   Augmentin [Amoxicillin-Pot Clavulanate] Nausea And Vomiting and Other (See Comments)    "projectile vomiting" Has patient had a PCN reaction causing immediate rash, facial/tongue/throat swelling, SOB or lightheadedness with hypotension:No Has patient had a PCN reaction causing severe rash involving mucus membranes or skin necrosis:No Has patient had a PCN reaction that required hospitalization:No Has patient had a PCN reaction  occurring within the last 10 years:Yes If all of the above answers are "NO", then may proceed with Cephalosporin use.    Amoxicillin Rash   Clindamycin/Lincomycin Rash    Family History  Problem Relation Age of Onset   Heart disease Mother    Heart disease Father    Hypertension Brother    Diabetes Sister    Colon cancer Neg Hx    Pancreatic cancer Neg Hx    Rectal cancer Neg Hx    Stomach cancer Neg Hx      Home Medications  Prior to Admission medications   Medication Sig Start Date End Date Taking? Authorizing Provider  Acetaminophen (TYLENOL 8 HOUR ARTHRITIS PAIN PO) Take by mouth in the morning and at bedtime.    [provider]  albuterol (VENTOLIN HFA) 108 (90 Base) MCG/ACT inhaler INHALE 2 PUFFS INTO LUNGS EVERY 4 HOURS AS NEEDED FOR WHEEZING OR SHORTNESS OF BREATH 09/10/20   McGowen, Adrian Blackwater, MD  ALPRAZolam Duanne Moron) 0.5 MG tablet TAKE 1 TABLET BY MOUTH 3 TIMES DAILY AS NEEDED FOR STRESS 07/13/20   McGowen, Adrian Blackwater, MD  amLODipine (NORVASC) 5 MG tablet Take 1 tablet (5 mg total) by mouth daily. 02/20/20   McGowen, Adrian Blackwater, MD  Apoaequorin (PREVAGEN PO) Take 1 tablet by mouth daily.    [provider]  atorvastatin (LIPITOR) 40 MG tablet Take 1 tablet (40 mg total) by mouth daily. 08/27/20   McGowen, Adrian Blackwater, MD  Biotin 5000 MCG TABS Take 5,000 mcg by mouth daily.    [provider]  Calcium Carbonate (CALCIUM 600 PO) Take 1,200 mg by mouth daily.     [provider]  carboxymethylcellulose (REFRESH PLUS) 0.5 % SOLN Place 2 drops into both eyes daily as needed (dry/irritated eyes.).    [provider]  Coenzyme Q10 200 MG capsule Take 200 mg by mouth daily.    [provider]  fluticasone (FLONASE) 50 MCG/ACT nasal spray Place 2 sprays into both nostrils daily.     [provider]  furosemide (LASIX) 40 MG tablet Take 1 tablet (40 mg total) by mouth daily. 08/27/20   McGowen, Adrian Blackwater, MD  hydrALAZINE (APRESOLINE)  50 MG tablet Take 1 tablet (50 mg total) by mouth 3 (three) times daily. 08/27/20   McGowen, Adrian Blackwater, MD  metoprolol tartrate (LOPRESSOR) 100 MG tablet TAKE 1 TABLET BY MOUTH 2 TIMES DAILY 09/08/20   Hilty, Nadean Corwin, MD  Multiple Vitamins-Minerals (AIRBORNE PO) Take 1 tablet by mouth daily as needed (immune support).    [provider]  Multiple Vitamins-Minerals (CENTRUM SILVER ULTRA WOMENS) TABS Take 1 tablet by mouth every evening.    [provider]  potassium chloride SA (KLOR-CON) 20 MEQ tablet Take 1 tablet (20 mEq total) by mouth 2 (two) times daily. 11/11/19   Hilty, Nadean Corwin, MD  predniSONE (DELTASONE) 10 MG tablet 3 tabs po qd x 3d, then 2 tabs po qd x 3d, then 1 tab po qd x 3d 08/27/20   McGowen, Adrian Blackwater, MD  Turmeric 500 MG CAPS Take 500 mg by mouth daily.    [provider]  XARELTO 15 MG TABS tablet TAKE 1 TABLET BY MOUTH EVERY DAY WITH SUPPER 09/08/20   [name], MD    Physical Exam: Vitals:   09/14/20 0630 09/14/20 0700 09/14/20 0730 09/14/20 0900  BP: 131/64 131/77 123/64 131/61  Pulse: 65 69 65 74  Resp: (!) 23 (!) 23 17 16   Temp:    98.4 F (36.9 C)  TempSrc:    Oral  SpO2: 98% 93% 98% 97%  Weight:      Height:        Constitutional: NAD, calm, comfortable, ill-appearing. Vitals:   09/14/20 0630 09/14/20 0700 09/14/20 0730 09/14/20 0900  BP: 131/64 131/77 123/64 131/61  Pulse: 65 69 65 74  Resp: (!) 23 (!) 23 17 16   Temp:    98.4 F (36.9 C)  TempSrc:    Oral  SpO2: 98% 93% 98% 97%  Weight:      Height:       Eyes: Pupils equal and round, lids and conjunctivae without icterus or erythema. ENMT: Mucous membranes are dry. Posterior pharynx clear of any exudate or lesions. Nares patent without discharge or bleeding.  Normocephalic, atraumatic.  Normal dentition.  Neck: normal, supple, no masses, trachea midline.  Thyroid nontender, no masses appreciated, no thyromegaly. Respiratory: clear to auscultation bilaterally. Chest  wall movements are symmetric. No wheezing, no crackles.  No rhonchi.  Normal respiratory effort. No accessory muscle use.  Cardiovascular: Regular rate and rhythm, no murmurs / rubs / gallops. Pulses: DP pulses 2+ bilaterally. No carotid bruits.  Capillary refill less than 3 seconds. Edema: 1+ bilaterally. GI: soft, protuberant, normal active bowel sounds. No hepatosplenomegaly. No rigidity, rebound, or guarding.  No CVA tenderness bilaterally.  No masses palpated.  Generalized tenderness with area of greatest tenderness in the periumbilical area. Musculoskeletal: no clubbing / cyanosis. No joint deformity upper and lower extremities. Good ROM, no contractures. Normal muscle tone.  No tenderness or deformity in the back bilaterally. Integument: no  lesions, ulcers. No induration. Clean, dry, intact.  Mildly erythematous/pink and scaling rash on posterior aspect of upper extremities and also scattered elsewhere (per patient unchanged). Neurologic: CN 2-12 grossly intact. Sensation grossly intact to light touch. DTR 2+ bilaterally.  Babinski: Toes downgoing bilaterally.  Strength 5/5 in all 4.  Intact rapid alternating movements bilaterally.  No pronator drift. Psychiatric: Normal judgment and insight. Alert and oriented x 3. Normal mood.  Normal and appropriate affect. Lymphatic: No cervical lymphadenopathy. No supraclavicular lymphadenopathy.   Labs on Admission: I have personally reviewed the following labs and imaging studies.  CBC: Recent Labs  Lab 09/14/20 0316  WBC 15.4*  NEUTROABS 12.8*  HGB 13.6  HCT 42.6  MCV 92.4  PLT 528    Basic Metabolic Panel: Recent Labs  Lab 09/14/20 0316  NA 137  K 3.9  CL 102  CO2 26  GLUCOSE 114*  BUN 15  CREATININE 0.99  CALCIUM 9.0    GFR: Estimated Creatinine Clearance: 39.2 mL/min (by C-G formula based on SCr of 0.99 mg/dL).  Liver Function Tests:  Recent Labs  Lab 09/14/20 0316  AST 19  ALT 15  ALKPHOS 81  BILITOT 1.3*  PROT 6.9   ALBUMIN 3.5    Urine analysis: None  Radiological Exams on Admission:  Viewed personally.  CT ABDOMEN PELVIS W CONTRAST  Result Date: 09/14/2020 CLINICAL DATA:  Abdominal pain for 2 days. Bowel obstruction suspected. EXAM: CT ABDOMEN AND PELVIS WITH CONTRAST TECHNIQUE: Multidetector CT imaging of the abdomen and pelvis was performed using the standard protocol following bolus administration of intravenous contrast. CONTRAST:  156m OMNIPAQUE IOHEXOL 350 MG/ML SOLN COMPARISON:  None. FINDINGS: Lower chest: Subsegmental atelectasis noted in the lung bases. Hepatobiliary: 5.7 cm homogeneous water density lesion in the posterior right liver is compatible with a cyst. 2 cm cyst noted in the anterior hepatic dome. Other scattered smaller hypoattenuating liver lesions are too small to characterize but likely cysts. There is no evidence for gallstones, gallbladder wall thickening, or pericholecystic fluid. No intrahepatic or extrahepatic biliary dilation. Pancreas: Diffuse atrophy of pancreatic parenchyma evident with dilatation of the main pancreatic duct in the head of pancreas. 2.9 x 2.4 cm cystic lesion identified in the tip of the pancreatic tail with surrounding edema/inflammation the tracts into the splenic hilum. Spleen: Trace perisplenic fluid evident. Adrenals/Urinary Tract: No adrenal nodule or mass. 1.9 cm water density lesion in the interpolar right kidney is compatible with a cyst cortical scarring noted in both kidneys. No evidence for hydroureter. The urinary bladder appears normal for the degree of distention. Stomach/Bowel: Stomach is decompressed. Duodenum is normally positioned as is the ligament of Treitz. No small bowel wall thickening. No small bowel dilatation. The terminal ileum is normal. The appendix is not well visualized, but there is no edema or inflammation in the region of the cecum. No gross colonic mass. No colonic wall thickening. Diverticular changes are noted in the left  colon without evidence of diverticulitis. Vascular/Lymphatic: There is moderate atherosclerotic calcification of the abdominal aorta without aneurysm. There is no gastrohepatic or hepatoduodenal ligament lymphadenopathy. No retroperitoneal or mesenteric lymphadenopathy. No pelvic sidewall lymphadenopathy. Reproductive: Unremarkable. Other: No free fluid in the pelvis. Musculoskeletal: No worrisome lytic or sclerotic osseous abnormality. Bilateral pars interarticularis defects noted at L5. Diffuse degenerative disc disease noted lumbar spine. IMPRESSION: 1. 2.9 x 2.4 cm cystic lesion in the tip of the pancreatic tail with surrounding edema/inflammation the tracts into the splenic hilum. Imaging features are most suggestive of acute pancreatitis with pseudocyst. Cystic pancreatic neoplasm is considered less likely but not excluded. MRI of the abdomen without and with contrast recommended to further evaluate. 2. Apparent dilatation of the main pancreatic duct through the head of the pancreas. This could also be further assessed by MRCP. 3. Hepatic and right renal cysts. 4. Left colonic diverticulosis without diverticulitis. 5. Aortic Atherosclerosis (ICD10-I70.0). Electronically Signed   By: EMisty StanleyM.D.   On: 09/14/2020 05:12      Assessment/Plan Principal Problem:   Idiopathic acute pancreatitis without necrosis or infection Active Problems:   PAF (paroxysmal atrial fibrillation) (HCC)   Chronic diastolic CHF (congestive heart failure) (HCC)   Pancreatic cyst   Pancreatic duct dilated   ANEMIA   Essential hypertension   Chronic anticoagulation   Pure hypercholesterolemia    Principal Problem:   Idiopathic acute pancreatitis without necrosis or infection Etiology unclear.  Just had lipid panel on 08/27/2020.  No significant history of alcohol use.  Plan: IV fluids: Lactated Ringer's.  IV pain medication as needed.  NPO.  GI consult.  Active  Problems:   PAF (paroxysmal atrial fibrillation)  (HCC) Plan: Continue home medications except hold anticoagulation of Xarelto in case any procedures are needed.  Last dose of Xarelto was in the early morning around 1 AM on 09/14/2020.  Restart Xarelto if no procedures are needed.    Chronic diastolic CHF (congestive heart failure) (HCC) No acute CHF.  Plan: Continue home medications.  Monitor I's/O.  Monitor for any shortness of breath.  Hold diuretic due to acute pancreatitis and n.p.o. status.    Pancreatic cyst CT: "2.9 x 2.4 cm cystic lesion in the tip of the pancreatic tail with surrounding edema/inflammation the tracts into the splenic hilum. Imaging features are most suggestive of acute pancreatitis with pseudocyst. Cystic pancreatic neoplasm is considered less likely but not excluded. MRI of the abdomen without and with contrast recommended to further evaluate." Plan: GI consult.  GI to determine whether further imaging would be beneficial.    Pancreatic duct dilated CT: "Apparent dilatation of the main pancreatic duct through the head of the pancreas. This could also be further assessed by MRCP." Plan: GI consult.  GI to determine whether patient would benefit from further evaluation.    Anemia. Chronic issue.  Plan: Monitor CBC.    Essential hypertension Plan: Home medications as tolerated.  As needed IV antihypertensive.    Chronic anticoagulation Plan: Hold Xarelto in case any procedures are planned.    Pure hypercholesterolemia Plan: Hold initially but restart if patient is tolerating oral medications.  Hepatic and right renal cysts. Noted on CT.  Plan: Outpatient follow-up.     DVT prophylaxis: SCDs.  Restart anticoagulation, which is being taken for atrial fibrillation, if no procedures are planned.  Code Status:   Full Code   Disposition Plan:   Patient is from:  Home  Anticipated DC to:  Home  Anticipated DC date:  09/17/2020  Anticipated DC barriers: Pain  Consults called:  GI, Dr. Loletha Carrow and PA Tye Savoy Admission status:  Inpatient   Severity of Illness: The appropriate patient status for this patient is INPATIENT. Inpatient status is judged to be reasonable and necessary in order to provide the required intensity of service to ensure the patient's safety. The patient's presenting symptoms, physical exam findings, and initial radiographic and laboratory data in the context of their chronic comorbidities is felt to place them at high risk for further clinical deterioration. Furthermore, it is not anticipated that the patient will be medically stable for discharge from the hospital within 2 midnights of admission. The following factors support the patient status of inpatient.   " The patient's presenting symptoms include abdominal pain, severe, with inability to tolerate PO. " The worrisome physical exam findings include abdominal tenderness. " The initial radiographic and laboratory data are worrisome because of acute pancreatitis, pancreatic pseudocyst, and pancreatic duct dilatation. " The chronic co-morbidities include atrial fibrillation on chronic anticoagulation, chronic diastolic congestive heart failure.   * I certify that at the point of admission it is my clinical judgment that the patient will require inpatient hospital care spanning beyond 2 midnights from the point of admission due to high intensity of service, high risk for further deterioration and high frequency of surveillance required.Tacey Ruiz MD [facility]  How to contact the [facility] Attending or Consulting provider [place] or covering provider during after hours [place], for this patient?   Check the care team in [facility] and look for a) attending/consulting [place] provider listed and b)  the [facility] team listed Log into [email or web address] and use [place]'s universal password to access. If you do not have the password, please contact the hospital operator. Locate the [facility] provider you are looking for under [facility] and page to a number that you can be directly reached. If you still have difficulty reaching the provider, please page the [facility] (Director on Call) for the Hospitalists listed on amion for assistance.  09/14/2020, 9:26 AM

## 2020-09-15 DIAGNOSIS — K862 Cyst of pancreas: Secondary | ICD-10-CM

## 2020-09-15 DIAGNOSIS — I5032 Chronic diastolic (congestive) heart failure: Secondary | ICD-10-CM

## 2020-09-15 DIAGNOSIS — I480 Paroxysmal atrial fibrillation: Secondary | ICD-10-CM

## 2020-09-15 DIAGNOSIS — K8500 Idiopathic acute pancreatitis without necrosis or infection: Secondary | ICD-10-CM | POA: Diagnosis not present

## 2020-09-15 DIAGNOSIS — I10 Essential (primary) hypertension: Secondary | ICD-10-CM

## 2020-09-15 LAB — COMPREHENSIVE METABOLIC PANEL WITH GFR
ALT: 11 U/L (ref 0–44)
AST: 12 U/L — ABNORMAL LOW (ref 15–41)
Albumin: 2.7 g/dL — ABNORMAL LOW (ref 3.5–5.0)
Alkaline Phosphatase: 70 U/L (ref 38–126)
Anion gap: 7 (ref 5–15)
BUN: 19 mg/dL (ref 8–23)
CO2: 24 mmol/L (ref 22–32)
Calcium: 8.4 mg/dL — ABNORMAL LOW (ref 8.9–10.3)
Chloride: 109 mmol/L (ref 98–111)
Creatinine, Ser: 1.01 mg/dL — ABNORMAL HIGH (ref 0.44–1.00)
GFR, Estimated: 55 mL/min — ABNORMAL LOW
Glucose, Bld: 80 mg/dL (ref 70–99)
Potassium: 4.1 mmol/L (ref 3.5–5.1)
Sodium: 140 mmol/L (ref 135–145)
Total Bilirubin: 1.3 mg/dL — ABNORMAL HIGH (ref 0.3–1.2)
Total Protein: 5.3 g/dL — ABNORMAL LOW (ref 6.5–8.1)

## 2020-09-15 LAB — CBC
HCT: 32.6 % — ABNORMAL LOW (ref 36.0–46.0)
Hemoglobin: 10.2 g/dL — ABNORMAL LOW (ref 12.0–15.0)
MCH: 29.4 pg (ref 26.0–34.0)
MCHC: 31.3 g/dL (ref 30.0–36.0)
MCV: 93.9 fL (ref 80.0–100.0)
Platelets: 160 10*3/uL (ref 150–400)
RBC: 3.47 MIL/uL — ABNORMAL LOW (ref 3.87–5.11)
RDW: 15.2 % (ref 11.5–15.5)
WBC: 13.5 10*3/uL — ABNORMAL HIGH (ref 4.0–10.5)
nRBC: 0 % (ref 0.0–0.2)

## 2020-09-15 LAB — PROTIME-INR
INR: 1.5 — ABNORMAL HIGH (ref 0.8–1.2)
Prothrombin Time: 17.9 s — ABNORMAL HIGH (ref 11.4–15.2)

## 2020-09-15 LAB — APTT: aPTT: 28 seconds (ref 24–36)

## 2020-09-15 MED ORDER — POLYETHYLENE GLYCOL 3350 17 G PO PACK
17.0000 g | PACK | Freq: Every day | ORAL | Status: DC
  Administered 2020-09-15: 17 g via ORAL
  Filled 2020-09-15: qty 1

## 2020-09-15 NOTE — Progress Notes (Addendum)
Progress Note Hospital Day: 2  Chief Complaint:    pancreatitis     ASSESSMENT AND PLAN  # 85 yo female with acute pancreatitis of unclear etiology but not Etoh or biliary related.  Medication related pancreatitis not excluded but seem unlikely (lipitor, furosemide). Triglycerides and total Ca+ normal. CT scan showed a 2.9 x 2.4 cystic lesion of pancreatic tail felt to be a pseudocyst vrs cystic neoplasm.  --Slight bump in Cr overnight 0.99 >> 1.01 but Hct better at 32%. WBC improved 15.4 >> 13.5. IVF rate decreased to 50 ml/ hr. She may be volume overloaded a bit based on exam but breathing fine.  --MRI / MCRP 8/29 with degraded images but showing mild pancreatitis and a 3.0 cm unilocular cystic lesion in the pancreatic tail, compatible with a pancreatic pseudocyst. This is surprising given the timeline ( early for wall maturation / pseudocyst) --She looks and feels better today. No abdominal pain unless she moves about or belches.  --Start clear liquids --Will need outpatient follow up abdominal imaging in a few weeks.  --Patient asks that we call her son Shanon Brow with an update. --Son is getting married and she plans to hopefully travel to [facility] Friday morning for the wedding. This shouldn't be a issue if she continues to improve.   # Constipation. No BM since Saturday despite several doses of Dulcolax.  --Start daily Miralax      # Hx of adenomatous colon polyps. She had an advanced adenoma in 2002, a small adenoma in 2009. In 2015 (last colonoscopy) 3 adenomas removed ranging 5-10 mm in size. She cancelled her surveillance colonoscopy in 2020.  -- Ms Lerner says she would like to still have her polyp surveillance colonoscopy at some point. She occasionally expediencies very scant rectal bleeding with bowel movements.  --Her candidacy for surveillance colonoscopy can be addressed outpatient when fully recovered from pancreatitis.    # Atrial fibrillation, on Xarelto.        DIAGNOSTIC STUDIES  09/13/20 CTAP w/ contrast   IMPRESSION: 1. 2.9 x 2.4 cm cystic lesion in the tip of the pancreatic tail with surrounding edema/inflammation the tracts into the splenic hilum. Imaging features are most suggestive of acute pancreatitis with pseudocyst. Cystic pancreatic neoplasm is considered less likely but not excluded. MRI of the abdomen without and with contrast recommended to further evaluate. 2. Apparent dilatation of the main pancreatic duct through the head of the pancreas. This could also be further assessed by MRCP. 3. Hepatic and right renal cysts. 4. Left colonic diverticulosis without diverticulitis. 5. Aortic Atherosclerosis (ICD10-I70.0).  09/14/20 MRI / MRCP FINDINGS: Motion degraded images.   Lower chest: Lung bases are clear.   Hepatobiliary: Scattered hepatic cysts measuring up to 5.5 cm in the posterior right hepatic lobe (series 6/image 11).   Gallbladder is unremarkable. No intrahepatic or extrahepatic ductal dilatation. No choledocholithiasis is seen.   Pancreas: Mild peripancreatic inflammatory changes/fluid with associated dilatation of the main pancreatic duct measuring up to 7 mm, likely related to the patient's known acute pancreatitis. No evidence of pancreatic divisum. 3.0 cm unilocular cystic lesion in the pancreatic tail, compatible with a pancreatic pseudocyst, without enhancement following contrast administration.   Spleen:  Within normal limits.   Adrenals/Urinary Tract:  Adrenal glands are within normal limits.   Bilateral hemorrhagic renal cysts (series 17/images 59 and 64), including a 2.1 cm cyst with layering hemorrhage in the lateral right lower kidney, benign (Bosniak II). No hydronephrosis.   Stomach/Bowel: Stomach  is within normal limits.   Visualized bowel is grossly unremarkable.   Vascular/Lymphatic:  No evidence of abdominal aortic aneurysm.   No suspicious abdominal lymphadenopathy.   Other:  No  abdominal ascites.   Musculoskeletal: No focal osseous lesions.   IMPRESSION: Motion degraded images.   Acute pancreatitis. Associated 3 cm pseudocyst in the pancreatic tail.   Bilateral hemorrhagic renal cysts, benign (Bosniak II).    SUBJECTIVE   Feels better today. No significant abdominal pain unless she belches or someone presses on LUQ. Still hasn't had a BM despite several laxatives since Saturday.        OBJECTIVE      Scheduled inpatient medications:   amLODipine  5 mg Oral Daily   fluticasone  2 spray Each Nare Daily   hydrALAZINE  50 mg Oral TID   melatonin  3 mg Oral QHS   metoprolol tartrate  100 mg Oral BID   pantoprazole (PROTONIX) IV  40 mg Intravenous Q12H   Continuous inpatient infusions:   lactated ringers 50 mL/hr at 09/15/20 0600   PRN inpatient medications: acetaminophen **OR** acetaminophen, albuterol, bisacodyl, HYDROcodone-acetaminophen, HYDROmorphone (DILAUDID) injection, metoprolol tartrate, ondansetron **OR** ondansetron (ZOFRAN) IV, polyvinyl alcohol, senna-docusate  Vital signs in last 24 hours: Temp:  [97.5 F (36.4 C)-98.6 F (37 C)] 98.6 F (37 C) (08/30 0446) Pulse Rate:  [63-77] 70 (08/30 0936) Resp:  [16-18] 16 (08/30 0446) BP: (105-135)/(46-58) 112/46 (08/30 0936) SpO2:  [89 %-99 %] 99 % (08/30 0446) Last BM Date: 09/11/20  Intake/Output Summary (Last 24 hours) at 09/15/2020 1015 Last data filed at 09/15/2020 1000 Gross per 24 hour  Intake 1854.63 ml  Output 250 ml  Net 1604.63 ml     Physical Exam:  General: Alert female in NAD Heart:  Regular rate, 2+ bilateral pedal edema.  Pulmonary: Normal respiratory effort but decreased breath sounds bilaterally Abdomen: Soft, nondistended, mild LUQ / epigastric tenderness. Normal bowel sounds.  Neurologic: Alert and oriented Psych: Pleasant. Cooperative.   Filed Weights   09/14/20 0158  Weight: 72.6 kg    Intake/Output from previous day: 08/29 0701 - 08/30 0700 In:  1854.6 [I.V.:1854.6] Out: 250 [Urine:250] Intake/Output this shift: No intake/output data recorded.    Lab Results: Recent Labs    09/14/20 0316 09/15/20 0430  WBC 15.4* 13.5*  HGB 13.6 10.2*  HCT 42.6 32.6*  PLT 207 160   BMET Recent Labs    09/14/20 0316 09/15/20 0430  NA 137 140  K 3.9 4.1  CL 102 109  CO2 26 24  GLUCOSE 114* 80  BUN 15 19  CREATININE 0.99 1.01*  CALCIUM 9.0 8.4*   LFT Recent Labs    09/15/20 0430  PROT 5.3*  ALBUMIN 2.7*  AST 12*  ALT 11  ALKPHOS 70  BILITOT 1.3*   PT/INR Recent Labs    09/15/20 0430  LABPROT 17.9*  INR 1.5*   Hepatitis Panel No results for input(s): HEPBSAG, HCVAB, HEPAIGM, HEPBIGM in the last 72 hours.  CT ABDOMEN PELVIS W CONTRAST  Result Date: 09/14/2020 CLINICAL DATA:  Abdominal pain for 2 days. Bowel obstruction suspected. EXAM: CT ABDOMEN AND PELVIS WITH CONTRAST TECHNIQUE: Multidetector CT imaging of the abdomen and pelvis was performed using the standard protocol following bolus administration of intravenous contrast. CONTRAST:  167m OMNIPAQUE IOHEXOL 350 MG/ML SOLN COMPARISON:  None. FINDINGS: Lower chest: Subsegmental atelectasis noted in the lung bases. Hepatobiliary: 5.7 cm homogeneous water density lesion in the posterior right liver is compatible with a cyst. 2  cm cyst noted in the anterior hepatic dome. Other scattered smaller hypoattenuating liver lesions are too small to characterize but likely cysts. There is no evidence for gallstones, gallbladder wall thickening, or pericholecystic fluid. No intrahepatic or extrahepatic biliary dilation. Pancreas: Diffuse atrophy of pancreatic parenchyma evident with dilatation of the main pancreatic duct in the head of pancreas. 2.9 x 2.4 cm cystic lesion identified in the tip of the pancreatic tail with surrounding edema/inflammation the tracts into the splenic hilum. Spleen: Trace perisplenic fluid evident. Adrenals/Urinary Tract: No adrenal nodule or mass. 1.9 cm  water density lesion in the interpolar right kidney is compatible with a cyst cortical scarring noted in both kidneys. No evidence for hydroureter. The urinary bladder appears normal for the degree of distention. Stomach/Bowel: Stomach is decompressed. Duodenum is normally positioned as is the ligament of Treitz. No small bowel wall thickening. No small bowel dilatation. The terminal ileum is normal. The appendix is not well visualized, but there is no edema or inflammation in the region of the cecum. No gross colonic mass. No colonic wall thickening. Diverticular changes are noted in the left colon without evidence of diverticulitis. Vascular/Lymphatic: There is moderate atherosclerotic calcification of the abdominal aorta without aneurysm. There is no gastrohepatic or hepatoduodenal ligament lymphadenopathy. No retroperitoneal or mesenteric lymphadenopathy. No pelvic sidewall lymphadenopathy. Reproductive: Unremarkable. Other: No free fluid in the pelvis. Musculoskeletal: No worrisome lytic or sclerotic osseous abnormality. Bilateral pars interarticularis defects noted at L5. Diffuse degenerative disc disease noted lumbar spine. IMPRESSION: 1. 2.9 x 2.4 cm cystic lesion in the tip of the pancreatic tail with surrounding edema/inflammation the tracts into the splenic hilum. Imaging features are most suggestive of acute pancreatitis with pseudocyst. Cystic pancreatic neoplasm is considered less likely but not excluded. MRI of the abdomen without and with contrast recommended to further evaluate. 2. Apparent dilatation of the main pancreatic duct through the head of the pancreas. This could also be further assessed by MRCP. 3. Hepatic and right renal cysts. 4. Left colonic diverticulosis without diverticulitis. 5. Aortic Atherosclerosis (ICD10-I70.0). Electronically Signed   By: Misty Stanley M.D.   On: 09/14/2020 05:12   MR ABDOMEN MRCP W WO CONTAST  Result Date: 09/14/2020 CLINICAL DATA:  Pancreatitis,  pseudocyst on CT EXAM: MRI ABDOMEN WITHOUT AND WITH CONTRAST (INCLUDING MRCP) TECHNIQUE: Multiplanar multisequence MR imaging of the abdomen was performed both before and after the administration of intravenous contrast. Heavily T2-weighted images of the biliary and pancreatic ducts were obtained, and three-dimensional MRCP images were rendered by post processing. CONTRAST:  79m GADAVIST GADOBUTROL 1 MMOL/ML IV SOLN COMPARISON:  CT abdomen/pelvis dated 09/14/2020 FINDINGS: Motion degraded images. Lower chest: Lung bases are clear. Hepatobiliary: Scattered hepatic cysts measuring up to 5.5 cm in the posterior right hepatic lobe (series 6/image 11). Gallbladder is unremarkable. No intrahepatic or extrahepatic ductal dilatation. No choledocholithiasis is seen. Pancreas: Mild peripancreatic inflammatory changes/fluid with associated dilatation of the main pancreatic duct measuring up to 7 mm, likely related to the patient's known acute pancreatitis. No evidence of pancreatic divisum. 3.0 cm unilocular cystic lesion in the pancreatic tail, compatible with a pancreatic pseudocyst, without enhancement following contrast administration. Spleen:  Within normal limits. Adrenals/Urinary Tract:  Adrenal glands are within normal limits. Bilateral hemorrhagic renal cysts (series 17/images 59 and 64), including a 2.1 cm cyst with layering hemorrhage in the lateral right lower kidney, benign (Bosniak II). No hydronephrosis. Stomach/Bowel: Stomach is within normal limits. Visualized bowel is grossly unremarkable. Vascular/Lymphatic:  No evidence of abdominal aortic aneurysm.  No suspicious abdominal lymphadenopathy. Other:  No abdominal ascites. Musculoskeletal: No focal osseous lesions. IMPRESSION: Motion degraded images. Acute pancreatitis. Associated 3 cm pseudocyst in the pancreatic tail. Bilateral hemorrhagic renal cysts, benign (Bosniak II). Electronically Signed   By: Julian Hy M.D.   On: 09/14/2020 22:30         Principal Problem:   Idiopathic acute pancreatitis without necrosis or infection Active Problems:   ANEMIA   Essential hypertension   PAF (paroxysmal atrial fibrillation) (HCC)   Chronic anticoagulation   Chronic diastolic CHF (congestive heart failure) ([place])   Pancreatic cyst   Pancreatic duct dilated   Pure hypercholesterolemia     LOS: 1 day   Tye Savoy ,NP 09/15/2020, 10:15 AM  I have discussed the case with the APP, and that is the plan I formulated. I personally interviewed and examined the patient.  Clinically improved today, less pain and nausea, less tender on exam. MRI report of distal pancreatic cystic lesion suggests benign process such as pseudocyst (again, unusual given acute time course of symptoms).  Clear liquid diet started today, IV fluids stopped.  Plan will be for further conservative management, possibly starting a low-fat diet tomorrow if patient is improved. Lipase ordered for tomorrow morning.  If she continues to improve, hopefully ready for discharge today after tomorrow with plans for outpatient follow-up with Dr. Henrene Pastor.  That would most likely include repeat imaging, perhaps a few months out to ensure resolution of cystic lesion.  There is a possibility of cystic neoplasm, though these are not necessarily malignant.  No CA 19-9 ordered because it might be elevated in the setting of acute pancreatitis.  (Also something to be reconsidered in outpatient setting.)    Nelida Meuse III Office: [phone]

## 2020-09-15 NOTE — Progress Notes (Signed)
Mobility Specialist - Cancellation Note  Cancellation: RN informed mobility that pt is no longer appropriate due to needing +2 assistance for movement.   [place] Specialist [facility] Services Phone: [phone] 09/15/20, 12:06 PM

## 2020-09-15 NOTE — Progress Notes (Signed)
Patient ID: Brenda Matthews, female   DOB: [birth year], 85 y.o.   MRN: [id number]  PROGRESS NOTE    Brenda Matthews  FGH:[id number] DOB: Feb 10, 1935 DOA: 09/14/2020 PCP: [name], MD   Brief Narrative:  85 year old female with history of paroxysmal A. fib on Xarelto, chronic diastolic heart failure, hypertension, hyperlipidemia, degenerative disc disease presented with abdominal pain with nausea.  On presentation, lipase was 1311 with WBCs of 15.4.  CT of the abdomen and pelvis was suggestive of acute pancreatitis with pancreatic pseudocyst, although cystic pancreatic neoplasm was not excluded with dilation of main pancreatic duct.  She was started on IV fluids.  GI was consulted.  Assessment & Plan:   Acute pancreatitis with possible pseudocyst -Questionable cause.  Triglycerides normal.  No significant history of alcohol use. -GI following.  MRI of abdomen showed acute pancreatitis with pseudocyst in the pancreatic tail.  Currently n.p.o.; patient requesting to drink some fluid.  On gentle hydration only given history of diastolic CHF.  Monitor for signs of fluid overload.  Follow further GI recommendations. -LFTs almost normal except for only mildly elevated bilirubin.  Monitor  Leukocytosis -Improving.  Monitor  Chronic diastolic CHF -Currently stable and compensated.  Strict input and output.  Daily weights.  Diuretic on hold.  Might have to resume in the next day or so complains of some shortness of breath intermittently  Paroxysmal A. fib -Xarelto on hold.  Will resume only once cleared by GI  Hyperlipidemia -Statin on hold  Bilateral hemorrhagic renal cysts -Outpatient follow-up.  Essential hypertension -Continue amlodipine, hydralazine and metoprolol.  Blood pressure stable  DVT prophylaxis: SCDs Code Status: Full Family Communication: None at bedside Disposition Plan: Status is: Inpatient  Remains inpatient appropriate because:Inpatient level of care appropriate due  to severity of illness  Dispo: The patient is from: Home              Anticipated d/c is to: Home              Patient currently is not medically stable to d/c.   Difficult to place patient No   Consultants: GI  Procedures: None  Antimicrobials: None   Subjective: Patient seen and examined at bedside.  Denies worsening abdominal pain.  Wants to at least drink something.  Has intermittent shortness of breath.  Denies any chest pain, fever or vomiting.  Objective: Vitals:   09/14/20 2227 09/15/20 0020 09/15/20 0446 09/15/20 0936  BP: (!) 119/51 (!) 126/55 (!) 135/54 (!) 112/46  Pulse: 66 70 73 70  Resp: 18 16 16    Temp: (!) 97.5 F (36.4 C) 98 F (36.7 C) 98.6 F (37 C)   TempSrc: Oral Oral Oral   SpO2: 90% 96% 99%   Weight:      Height:        Intake/Output Summary (Last 24 hours) at 09/15/2020 1137 Last data filed at 09/15/2020 1000 Gross per 24 hour  Intake 1854.63 ml  Output 250 ml  Net 1604.63 ml   Filed Weights   09/14/20 0158  Weight: 72.6 kg    Examination:  General exam: Appears calm and comfortable.  Currently on room air Respiratory system: Bilateral decreased breath sounds at bases Cardiovascular system: S1 & S2 heard, Rate controlled Gastrointestinal system: Abdomen is slightly distended, soft and mildly tender in the epigastric/periumbilical region.  Normal bowel sounds heard. Extremities: No cyanosis, clubbing; trace lower extremity edema Central nervous system: Alert and oriented. No focal neurological deficits. Moving extremities Skin: No  rashes, lesions or ulcers Psychiatry: Judgement and insight appear normal. Mood & affect appropriate.     Data Reviewed: I have personally reviewed following labs and imaging studies  CBC: Recent Labs  Lab 09/14/20 0316 09/15/20 0430  WBC 15.4* 13.5*  NEUTROABS 12.8*  --   HGB 13.6 10.2*  HCT 42.6 32.6*  MCV 92.4 93.9  PLT 207 093   Basic Metabolic Panel: Recent Labs  Lab 09/14/20 0316  09/15/20 0430  NA 137 140  K 3.9 4.1  CL 102 109  CO2 26 24  GLUCOSE 114* 80  BUN 15 19  CREATININE 0.99 1.01*  CALCIUM 9.0 8.4*   GFR: Estimated Creatinine Clearance: 38.4 mL/min (A) (by C-G formula based on SCr of 1.01 mg/dL (H)). Liver Function Tests: Recent Labs  Lab 09/14/20 0316 09/15/20 0430  AST 19 12*  ALT 15 11  ALKPHOS 81 70  BILITOT 1.3* 1.3*  PROT 6.9 5.3*  ALBUMIN 3.5 2.7*   Recent Labs  Lab 09/14/20 0446  LIPASE 1,311*   No results for input(s): AMMONIA in the last 168 hours. Coagulation Profile: Recent Labs  Lab 09/15/20 0430  INR 1.5*   Cardiac Enzymes: No results for input(s): CKTOTAL, CKMB, CKMBINDEX, TROPONINI in the last 168 hours. BNP (last 3 results) No results for input(s): PROBNP in the last 8760 hours. HbA1C: No results for input(s): HGBA1C in the last 72 hours. CBG: No results for input(s): GLUCAP in the last 168 hours. Lipid Profile: No results for input(s): CHOL, HDL, LDLCALC, TRIG, CHOLHDL, LDLDIRECT in the last 72 hours. Thyroid Function Tests: No results for input(s): TSH, T4TOTAL, FREET4, T3FREE, THYROIDAB in the last 72 hours. Anemia Panel: No results for input(s): VITAMINB12, FOLATE, FERRITIN, TIBC, IRON, RETICCTPCT in the last 72 hours. Sepsis Labs: No results for input(s): PROCALCITON, LATICACIDVEN in the last 168 hours.  Recent Results (from the past 240 hour(s))  Resp Panel by RT-PCR (Flu A&B, Covid) Nasopharyngeal Swab     Status: None   Collection Time: 09/14/20  6:10 AM   Specimen: Nasopharyngeal Swab; Nasopharyngeal(NP) swabs in vial transport medium  Result Value Ref Range Status   SARS Coronavirus 2 by RT PCR NEGATIVE NEGATIVE Final    Comment: (NOTE) SARS-CoV-2 target nucleic acids are NOT DETECTED.  The SARS-CoV-2 RNA is generally detectable in upper respiratory specimens during the acute phase of infection. The lowest concentration of SARS-CoV-2 viral copies this assay can detect is 138 copies/mL. A  negative result does not preclude SARS-Cov-2 infection and should not be used as the sole basis for treatment or other patient management decisions. A negative result may occur with  improper specimen collection/handling, submission of specimen other than nasopharyngeal swab, presence of viral mutation(s) within the areas targeted by this assay, and inadequate number of viral copies(<138 copies/mL). A negative result must be combined with clinical observations, patient history, and epidemiological information. The expected result is Negative.  Fact Sheet for Patients:  EntrepreneurPulse.com.au  Fact Sheet for Healthcare Providers:  IncredibleEmployment.be  This test is no t yet approved or cleared by the [place] FDA and  has been authorized for detection and/or diagnosis of SARS-CoV-2 by FDA under an Emergency Use Authorization (EUA). This EUA will remain  in effect (meaning this test can be used) for the duration of the COVID-19 declaration under Section 564(b)(1) of the Act, 21 U.S.C.section 360bbb-3(b)(1), unless the authorization is terminated  or revoked sooner.       Influenza A by PCR NEGATIVE NEGATIVE Final  Influenza B by PCR NEGATIVE NEGATIVE Final    Comment: (NOTE) The Xpert Xpress SARS-CoV-2/FLU/RSV plus assay is intended as an aid in the diagnosis of influenza from Nasopharyngeal swab specimens and should not be used as a sole basis for treatment. Nasal washings and aspirates are unacceptable for Xpert Xpress SARS-CoV-2/FLU/RSV testing.  Fact Sheet for Patients: EntrepreneurPulse.com.au  Fact Sheet for Healthcare Providers: IncredibleEmployment.be  This test is not yet approved or cleared by the [place] FDA and has been authorized for detection and/or diagnosis of SARS-CoV-2 by FDA under an Emergency Use Authorization (EUA). This EUA will remain in effect (meaning this test can  be used) for the duration of the COVID-19 declaration under Section 564(b)(1) of the Act, 21 U.S.C. section 360bbb-3(b)(1), unless the authorization is terminated or revoked.  Performed at [facility], [address] [place], [place] [address]          Radiology Studies: CT ABDOMEN PELVIS W CONTRAST  Result Date: 09/14/2020 CLINICAL DATA:  Abdominal pain for 2 days. Bowel obstruction suspected. EXAM: CT ABDOMEN AND PELVIS WITH CONTRAST TECHNIQUE: Multidetector CT imaging of the abdomen and pelvis was performed using the standard protocol following bolus administration of intravenous contrast. CONTRAST:  174m OMNIPAQUE IOHEXOL 350 MG/ML SOLN COMPARISON:  None. FINDINGS: Lower chest: Subsegmental atelectasis noted in the lung bases. Hepatobiliary: 5.7 cm homogeneous water density lesion in the posterior right liver is compatible with a cyst. 2 cm cyst noted in the anterior hepatic dome. Other scattered smaller hypoattenuating liver lesions are too small to characterize but likely cysts. There is no evidence for gallstones, gallbladder wall thickening, or pericholecystic fluid. No intrahepatic or extrahepatic biliary dilation. Pancreas: Diffuse atrophy of pancreatic parenchyma evident with dilatation of the main pancreatic duct in the head of pancreas. 2.9 x 2.4 cm cystic lesion identified in the tip of the pancreatic tail with surrounding edema/inflammation the tracts into the splenic hilum. Spleen: Trace perisplenic fluid evident. Adrenals/Urinary Tract: No adrenal nodule or mass. 1.9 cm water density lesion in the interpolar right kidney is compatible with a cyst cortical scarring noted in both kidneys. No evidence for hydroureter. The urinary bladder appears normal for the degree of distention. Stomach/Bowel: Stomach is decompressed. Duodenum is normally positioned as is the ligament of Treitz. No small bowel wall thickening. No small bowel dilatation. The terminal ileum is normal.  The appendix is not well visualized, but there is no edema or inflammation in the region of the cecum. No gross colonic mass. No colonic wall thickening. Diverticular changes are noted in the left colon without evidence of diverticulitis. Vascular/Lymphatic: There is moderate atherosclerotic calcification of the abdominal aorta without aneurysm. There is no gastrohepatic or hepatoduodenal ligament lymphadenopathy. No retroperitoneal or mesenteric lymphadenopathy. No pelvic sidewall lymphadenopathy. Reproductive: Unremarkable. Other: No free fluid in the pelvis. Musculoskeletal: No worrisome lytic or sclerotic osseous abnormality. Bilateral pars interarticularis defects noted at L5. Diffuse degenerative disc disease noted lumbar spine. IMPRESSION: 1. 2.9 x 2.4 cm cystic lesion in the tip of the pancreatic tail with surrounding edema/inflammation the tracts into the splenic hilum. Imaging features are most suggestive of acute pancreatitis with pseudocyst. Cystic pancreatic neoplasm is considered less likely but not excluded. MRI of the abdomen without and with contrast recommended to further evaluate. 2. Apparent dilatation of the main pancreatic duct through the head of the pancreas. This could also be further assessed by MRCP. 3. Hepatic and right renal cysts. 4. Left colonic diverticulosis without diverticulitis. 5. Aortic Atherosclerosis (ICD10-I70.0).  Electronically Signed   By: Misty Stanley M.D.   On: 09/14/2020 05:12   MR ABDOMEN MRCP W WO CONTAST  Result Date: 09/14/2020 CLINICAL DATA:  Pancreatitis, pseudocyst on CT EXAM: MRI ABDOMEN WITHOUT AND WITH CONTRAST (INCLUDING MRCP) TECHNIQUE: Multiplanar multisequence MR imaging of the abdomen was performed both before and after the administration of intravenous contrast. Heavily T2-weighted images of the biliary and pancreatic ducts were obtained, and three-dimensional MRCP images were rendered by post processing. CONTRAST:  59m GADAVIST GADOBUTROL 1 MMOL/ML  IV SOLN COMPARISON:  CT abdomen/pelvis dated 09/14/2020 FINDINGS: Motion degraded images. Lower chest: Lung bases are clear. Hepatobiliary: Scattered hepatic cysts measuring up to 5.5 cm in the posterior right hepatic lobe (series 6/image 11). Gallbladder is unremarkable. No intrahepatic or extrahepatic ductal dilatation. No choledocholithiasis is seen. Pancreas: Mild peripancreatic inflammatory changes/fluid with associated dilatation of the main pancreatic duct measuring up to 7 mm, likely related to the patient's known acute pancreatitis. No evidence of pancreatic divisum. 3.0 cm unilocular cystic lesion in the pancreatic tail, compatible with a pancreatic pseudocyst, without enhancement following contrast administration. Spleen:  Within normal limits. Adrenals/Urinary Tract:  Adrenal glands are within normal limits. Bilateral hemorrhagic renal cysts (series 17/images 59 and 64), including a 2.1 cm cyst with layering hemorrhage in the lateral right lower kidney, benign (Bosniak II). No hydronephrosis. Stomach/Bowel: Stomach is within normal limits. Visualized bowel is grossly unremarkable. Vascular/Lymphatic:  No evidence of abdominal aortic aneurysm. No suspicious abdominal lymphadenopathy. Other:  No abdominal ascites. Musculoskeletal: No focal osseous lesions. IMPRESSION: Motion degraded images. Acute pancreatitis. Associated 3 cm pseudocyst in the pancreatic tail. Bilateral hemorrhagic renal cysts, benign (Bosniak II). Electronically Signed   By: SJulian HyM.D.   On: 09/14/2020 22:30        Scheduled Meds:  amLODipine  5 mg Oral Daily   fluticasone  2 spray Each Nare Daily   hydrALAZINE  50 mg Oral TID   melatonin  3 mg Oral QHS   metoprolol tartrate  100 mg Oral BID   pantoprazole (PROTONIX) IV  40 mg Intravenous Q12H   polyethylene glycol  17 g Oral Daily   Continuous Infusions:  lactated ringers 50 mL/hr at 09/15/20 0600          KAline August MD [facility] 09/15/2020, 11:37 AM

## 2020-09-16 DIAGNOSIS — K5900 Constipation, unspecified: Secondary | ICD-10-CM | POA: Diagnosis not present

## 2020-09-16 DIAGNOSIS — K8500 Idiopathic acute pancreatitis without necrosis or infection: Secondary | ICD-10-CM | POA: Diagnosis not present

## 2020-09-16 LAB — CBC WITH DIFFERENTIAL/PLATELET
Abs Immature Granulocytes: 0.06 10*3/uL (ref 0.00–0.07)
Basophils Absolute: 0 10*3/uL (ref 0.0–0.1)
Basophils Relative: 0 %
Eosinophils Absolute: 0.1 10*3/uL (ref 0.0–0.5)
Eosinophils Relative: 0 %
HCT: 32.5 % — ABNORMAL LOW (ref 36.0–46.0)
Hemoglobin: 10.2 g/dL — ABNORMAL LOW (ref 12.0–15.0)
Immature Granulocytes: 1 %
Lymphocytes Relative: 6 %
Lymphs Abs: 0.7 10*3/uL (ref 0.7–4.0)
MCH: 29.5 pg (ref 26.0–34.0)
MCHC: 31.4 g/dL (ref 30.0–36.0)
MCV: 93.9 fL (ref 80.0–100.0)
Monocytes Absolute: 0.8 10*3/uL (ref 0.1–1.0)
Monocytes Relative: 7 %
Neutro Abs: 10 10*3/uL — ABNORMAL HIGH (ref 1.7–7.7)
Neutrophils Relative %: 86 %
Platelets: 170 10*3/uL (ref 150–400)
RBC: 3.46 MIL/uL — ABNORMAL LOW (ref 3.87–5.11)
RDW: 14.8 % (ref 11.5–15.5)
WBC: 11.6 10*3/uL — ABNORMAL HIGH (ref 4.0–10.5)
nRBC: 0 % (ref 0.0–0.2)

## 2020-09-16 LAB — COMPREHENSIVE METABOLIC PANEL
ALT: 11 U/L (ref 0–44)
AST: 15 U/L (ref 15–41)
Albumin: 2.6 g/dL — ABNORMAL LOW (ref 3.5–5.0)
Alkaline Phosphatase: 78 U/L (ref 38–126)
Anion gap: 6 (ref 5–15)
BUN: 18 mg/dL (ref 8–23)
CO2: 24 mmol/L (ref 22–32)
Calcium: 8.4 mg/dL — ABNORMAL LOW (ref 8.9–10.3)
Chloride: 106 mmol/L (ref 98–111)
Creatinine, Ser: 0.92 mg/dL (ref 0.44–1.00)
GFR, Estimated: >60 mL/min (ref >60)
Glucose, Bld: 108 mg/dL — ABNORMAL HIGH (ref 70–99)
Potassium: 4.6 mmol/L (ref 3.5–5.1)
Sodium: 136 mmol/L (ref 135–145)
Total Bilirubin: 1 mg/dL (ref 0.3–1.2)
Total Protein: 5.6 g/dL — ABNORMAL LOW (ref 6.5–8.1)

## 2020-09-16 LAB — LIPASE, BLOOD: Lipase: 47 U/L (ref 11–51)

## 2020-09-16 LAB — MAGNESIUM: Magnesium: 2 mg/dL (ref 1.7–2.4)

## 2020-09-16 MED ORDER — BISACODYL 10 MG RE SUPP
10.0000 mg | Freq: Once | RECTAL | Status: AC
  Administered 2020-09-16: 10 mg via RECTAL
  Filled 2020-09-16: qty 1

## 2020-09-16 MED ORDER — ZOLPIDEM TARTRATE 5 MG PO TABS: 5.0000 mg | ORAL_TABLET | Freq: Every evening | ORAL | Status: DC | PRN

## 2020-09-16 MED ORDER — RIVAROXABAN 15 MG PO TABS
15.0000 mg | ORAL_TABLET | Freq: Every day | ORAL | Status: DC
  Administered 2020-09-16: 15 mg via ORAL
  Filled 2020-09-16: qty 1

## 2020-09-16 MED ORDER — BISACODYL 5 MG PO TBEC
10.0000 mg | DELAYED_RELEASE_TABLET | Freq: Once | ORAL | Status: AC
  Administered 2020-09-16: 10 mg via ORAL

## 2020-09-16 MED ORDER — POLYETHYLENE GLYCOL 3350 17 G PO PACK
17.0000 g | PACK | Freq: Two times a day (BID) | ORAL | Status: DC
  Administered 2020-09-16 – 2020-09-19 (×7): 17 g via ORAL
  Filled 2020-09-16 (×7): qty 1

## 2020-09-16 MED ORDER — ALPRAZOLAM 0.5 MG PO TABS
0.5000 mg | ORAL_TABLET | Freq: Every evening | ORAL | Status: DC | PRN
  Administered 2020-09-16 – 2020-09-17 (×2): 0.5 mg via ORAL
  Filled 2020-09-16 (×2): qty 1

## 2020-09-16 NOTE — Progress Notes (Addendum)
Progress Note Hospital Day: 3  Chief Complaint:    pancreatitis     ASSESSMENT AND PLAN   # 85 yo female with acute pancreatitis of unclear etiology but not Etoh or biliary related.  Medication related pancreatitis  (lipitor, furosemide) not excluded but seem unlikely. Triglycerides and total Ca+ normal. CT scan showed a 2.9 x 2.4 cystic lesion of pancreatic tail felt to be a pseudocyst vrs cystic neoplasm.  --MRI / MCRP 8/29 with degraded images but showing mild pancreatitis and a 3.0 cm unilocular cystic lesion in the pancreatic tail, suggestive of a pseudocyst. Seems early for a pseudocyst.  --Creatinine 0.92, HCT 32.5%, WBC down to 11.6.  Lipase 47 --Tolerating clear liquids but difficult to know how much better she actually feels. She has been having LUQ discomfort today but implies it is mainly associated with movement. She is struggling with decision to go to [place] on Friday for Son's wedding. She doesn't want him to be upset if she doesn't make it . She wants to try solids for lunch so will advance to low fat diet and she how she does.  --I tried to call son Waunita Schooner to give an update, got voice mail. Will try again later. --Follow up with Dr. Henrene Pastor on 10/12 at 9:40 am. Will probably need follow imaging and maybe [id number]    # Constipation. No BM since Saturday despite several doses of Dulcolax.  --Several laxatives since Saturday and started daily Miralax yesterday but still no BM.  --Increase Miralax to BID today, given two dulcoax tabs      # Hx of adenomatous colon polyps. She had an advanced adenoma in 2002, a small adenoma in 2009. In 2015 (last colonoscopy) 3 adenomas removed ranging 5-10 mm in size. She cancelled her surveillance colonoscopy in 2020.  -- Ms Mcdevitt says she would like to still have her polyp surveillance colonoscopy at some point. She occasionally expediencies very scant rectal bleeding with bowel movements.  --Her candidacy for surveillance colonoscopy can be  addressed outpatient when fully recovered from pancreatitis.   # Drop in hgb. Hgb was around baseline 12.6 - 13.6 at time of admission, now 10.2 but suspect dilutional related to IVF.  --Can repeat CBC when we see her in the office.    # Atrial fibrillation, on Xarelto.    DIAGNOSTIC STUDIES  09/13/20 CTAP w/ contrast   IMPRESSION: 1. 2.9 x 2.4 cm cystic lesion in the tip of the pancreatic tail with surrounding edema/inflammation the tracts into the splenic hilum. Imaging features are most suggestive of acute pancreatitis with pseudocyst. Cystic pancreatic neoplasm is considered less likely but not excluded. MRI of the abdomen without and with contrast recommended to further evaluate. 2. Apparent dilatation of the main pancreatic duct through the head of the pancreas. This could also be further assessed by MRCP. 3. Hepatic and right renal cysts. 4. Left colonic diverticulosis without diverticulitis. 5. Aortic Atherosclerosis (ICD10-I70.0).   09/14/20 MRI / MRCP FINDINGS: Motion degraded images.   Lower chest: Lung bases are clear.   Hepatobiliary: Scattered hepatic cysts measuring up to 5.5 cm in the posterior right hepatic lobe (series 6/image 11).   Gallbladder is unremarkable. No intrahepatic or extrahepatic ductal dilatation. No choledocholithiasis is seen.   Pancreas: Mild peripancreatic inflammatory changes/fluid with associated dilatation of the main pancreatic duct measuring up to 7 mm, likely related to the patient's known acute pancreatitis. No evidence of pancreatic divisum. 3.0 cm unilocular cystic lesion in the pancreatic tail,  compatible with a pancreatic pseudocyst, without enhancement following contrast administration.   Spleen:  Within normal limits.   Adrenals/Urinary Tract:  Adrenal glands are within normal limits.   Bilateral hemorrhagic renal cysts (series 17/images 59 and 64), including a 2.1 cm cyst with layering hemorrhage in the lateral right  lower kidney, benign (Bosniak II). No hydronephrosis.   Stomach/Bowel: Stomach is within normal limits.   Visualized bowel is grossly unremarkable.   Vascular/Lymphatic:  No evidence of abdominal aortic aneurysm.   No suspicious abdominal lymphadenopathy.   Other:  No abdominal ascites.   Musculoskeletal: No focal osseous lesions.   IMPRESSION: Motion degraded images.   Acute pancreatitis. Associated 3 cm pseudocyst in the pancreatic tail.   Bilateral hemorrhagic renal cysts, benign (Bosniak II).     SUBJECTIVE   Tolerating liquids. Having some LUQ discomfort this am but manly just associated with movement. No nausea. Still hasn't had a BM. She is in a quandary about upcoming trip to [place] son's wedding. She doesn't want to be a burden.        OBJECTIVE      Scheduled inpatient medications:   amLODipine  5 mg Oral Daily   fluticasone  2 spray Each Nare Daily   hydrALAZINE  50 mg Oral TID   melatonin  3 mg Oral QHS   metoprolol tartrate  100 mg Oral BID   pantoprazole (PROTONIX) IV  40 mg Intravenous Q12H   polyethylene glycol  17 g Oral Daily   Continuous inpatient infusions:   lactated ringers 50 mL/hr at 09/16/20 0058   PRN inpatient medications: acetaminophen **OR** acetaminophen, albuterol, bisacodyl, HYDROcodone-acetaminophen, HYDROmorphone (DILAUDID) injection, metoprolol tartrate, ondansetron **OR** ondansetron (ZOFRAN) IV, polyvinyl alcohol, senna-docusate  Vital signs in last 24 hours: Temp:  [97.6 F (36.4 C)-98.7 F (37.1 C)] 98.5 F (36.9 C) (08/31 0538) Pulse Rate:  [63-84] 71 (08/31 0538) Resp:  [16-18] 16 (08/31 0538) BP: (104-142)/(46-61) 122/58 (08/31 0538) SpO2:  [86 %-91 %] 88 % (08/31 0538) Last BM Date: 09/12/20  Intake/Output Summary (Last 24 hours) at 09/16/2020 0901 Last data filed at 09/16/2020 0733 Gross per 24 hour  Intake 1735.99 ml  Output 1550 ml  Net 185.99 ml     Physical Exam:  General: Alert female in NAD in  bedside chair Heart:  Regular rate and rhythm. No lower extremity edema Pulmonary: Normal respiratory effort Abdomen: Soft, nondistended, mild LUQ tenderness.  Normal bowel sounds.  Neurologic: Alert and oriented Psych: Pleasant. Cooperative.   Filed Weights   09/14/20 0158  Weight: 72.6 kg    Intake/Output from previous day: 08/30 0701 - 08/31 0700 In: 6734 [P.O.:600; I.V.:1136] Out: 1550 [Urine:1550] Intake/Output this shift: No intake/output data recorded.    Lab Results: Recent Labs    09/14/20 0316 09/15/20 0430 09/16/20 0434  WBC 15.4* 13.5* 11.6*  HGB 13.6 10.2* 10.2*  HCT 42.6 32.6* 32.5*  PLT 207 160 170   BMET Recent Labs    09/14/20 0316 09/15/20 0430 09/16/20 0434  NA 137 140 136  K 3.9 4.1 4.6  CL 102 109 106  CO2 26 24 24   GLUCOSE 114* 80 108*  BUN 15 19 18   CREATININE 0.99 1.01* 0.92  CALCIUM 9.0 8.4* 8.4*   LFT Recent Labs    09/16/20 0434  PROT 5.6*  ALBUMIN 2.6*  AST 15  ALT 11  ALKPHOS 78  BILITOT 1.0   PT/INR Recent Labs    09/15/20 0430  LABPROT 17.9*  INR 1.5*  Hepatitis Panel No results for input(s): HEPBSAG, HCVAB, HEPAIGM, HEPBIGM in the last 72 hours.  MR ABDOMEN MRCP W WO CONTAST  Result Date: 09/14/2020 CLINICAL DATA:  Pancreatitis, pseudocyst on CT EXAM: MRI ABDOMEN WITHOUT AND WITH CONTRAST (INCLUDING MRCP) TECHNIQUE: Multiplanar multisequence MR imaging of the abdomen was performed both before and after the administration of intravenous contrast. Heavily T2-weighted images of the biliary and pancreatic ducts were obtained, and three-dimensional MRCP images were rendered by post processing. CONTRAST:  29m GADAVIST GADOBUTROL 1 MMOL/ML IV SOLN COMPARISON:  CT abdomen/pelvis dated 09/14/2020 FINDINGS: Motion degraded images. Lower chest: Lung bases are clear. Hepatobiliary: Scattered hepatic cysts measuring up to 5.5 cm in the posterior right hepatic lobe (series 6/image 11). Gallbladder is unremarkable. No  intrahepatic or extrahepatic ductal dilatation. No choledocholithiasis is seen. Pancreas: Mild peripancreatic inflammatory changes/fluid with associated dilatation of the main pancreatic duct measuring up to 7 mm, likely related to the patient's known acute pancreatitis. No evidence of pancreatic divisum. 3.0 cm unilocular cystic lesion in the pancreatic tail, compatible with a pancreatic pseudocyst, without enhancement following contrast administration. Spleen:  Within normal limits. Adrenals/Urinary Tract:  Adrenal glands are within normal limits. Bilateral hemorrhagic renal cysts (series 17/images 59 and 64), including a 2.1 cm cyst with layering hemorrhage in the lateral right lower kidney, benign (Bosniak II). No hydronephrosis. Stomach/Bowel: Stomach is within normal limits. Visualized bowel is grossly unremarkable. Vascular/Lymphatic:  No evidence of abdominal aortic aneurysm. No suspicious abdominal lymphadenopathy. Other:  No abdominal ascites. Musculoskeletal: No focal osseous lesions. IMPRESSION: Motion degraded images. Acute pancreatitis. Associated 3 cm pseudocyst in the pancreatic tail. Bilateral hemorrhagic renal cysts, benign (Bosniak II). Electronically Signed   By: SJulian HyM.D.   On: 09/14/2020 22:30        Principal Problem:   Idiopathic acute pancreatitis without necrosis or infection Active Problems:   ANEMIA   Essential hypertension   PAF (paroxysmal atrial fibrillation) (HCC)   Chronic anticoagulation   Chronic diastolic CHF (congestive heart failure) ([facility]   Pancreatic cyst   Pancreatic duct dilated   Pure hypercholesterolemia     LOS: 2 days   PTye Savoy,NP 09/16/2020, 9:01 AM    I have discussed the case with the APP, and that is the plan I formulated. I personally interviewed and examined the patient.  CC: Acute pancreatitis of unknown cause  Her abdominal pain has been waxing and waning since I last saw her yesterday.  She also is not had a  bowel movement in several days.  On my exam, she is softly diminished with decreased bowel sounds. Abdomen is however less tender today.  She is also sitting up in a chair, but frankly looks exhausted and confirms that she is not sleeping well at night.  Lipase normalized and WBC 11.6.  I believe her pancreatitis is slowly improving, but there is still persistent inflammation in the tail the pancreas/splenic hilum, and this will cause persistent pain even after biochemical improvement.  She has not yet had any solid food, and we are going to cautiously try a low-fat diet today.  She is certainly not ready to be discharged from the hospital, and I suspect she may not even be ready by tomorrow.  Even if she is somehow ready for hospital discharge tomorrow, I have cautioned her against leaving town at the end of this week [place]   She also probably has a mild ileus right now from the pancreatitis and  being laid up in bed for few days and decreased oral intake.  No improvement yet with an oral dose of 10 mg Dulcolax about 3 and half hours ago. I will order a Dulcolax suppository to be given this afternoon, and I will message the hospitalist to see how they may be able to help her get some sleep tonight.   Nelida Meuse III Office: [phone]

## 2020-09-16 NOTE — Progress Notes (Signed)
Patient ID: Brenda Matthews, female   DOB: 09-10-1935, 85 y.o.   MRN: [id number]  PROGRESS NOTE    Brenda Matthews  NOI:[id number] DOB: 21-Jun-1935 DOA: 09/14/2020 PCP: [name], MD   Brief Narrative:  85 year old female with history of paroxysmal A. fib on Xarelto, chronic diastolic heart failure, hypertension, hyperlipidemia, degenerative disc disease presented with abdominal pain with nausea.  On presentation, lipase was 1311 with WBCs of 15.4.  CT of the abdomen and pelvis was suggestive of acute pancreatitis with pancreatic pseudocyst, although cystic pancreatic neoplasm was not excluded with dilation of main pancreatic duct.  She was started on IV fluids.  GI was consulted.  Assessment & Plan:   Acute pancreatitis with possible pseudocyst -Questionable cause.  Triglycerides normal.  No significant history of alcohol use. -GI following.  MRI of abdomen showed acute pancreatitis with pseudocyst in the pancreatic tail.   Some clinical improvement.  Tolerating clears.  Advancing to low-fat diet today.   Lipase normalized.  LFTs normalized.  Leukocytosis -Improving.  Monitor  Chronic diastolic CHF -Currently stable and compensated.  Strict input and output.  Daily weights.  Diuretic on hold.    Paroxysmal A. fib -Xarelto on hold.  Resume today.  No procedures anticipated.  Hyperlipidemia -Statin on hold  Bilateral hemorrhagic renal cysts -Outpatient follow-up.  Essential hypertension -Continue amlodipine, hydralazine and metoprolol.  Blood pressure stable  DVT prophylaxis: SCDs Code Status: Full Family Communication: None. Disposition Plan: Status is: Inpatient  Remains inpatient appropriate because:Inpatient level of care appropriate due to severity of illness  Dispo: The patient is from: Home              Anticipated d/c is to: Home              Patient currently is not medically stable to d/c.   Difficult to place patient No   Consultants: GI  Procedures:  None  Antimicrobials: None   Subjective: Patient seen and examined.  Occasional mild pain in epigastric region with some cramping.  Tolerating liquids.  No bowel movement yet.  Denies any nausea vomiting. She had episode of shortness of breath on attempted mobility in the morning but better now.  Objective: Vitals:   09/15/20 0936 09/15/20 1339 09/15/20 2059 09/16/20 0538  BP: (!) 112/46 (!) 104/49 (!) 142/61 (!) 122/58  Pulse: 70 63 84 71  Resp:  18 16 16   Temp:  97.6 F (36.4 C) 98.7 F (37.1 C) 98.5 F (36.9 C)  TempSrc:  Oral Oral Oral  SpO2:  91% (!) 86% (!) 88%  Weight:      Height:        Intake/Output Summary (Last 24 hours) at 09/16/2020 1325 Last data filed at 09/16/2020 1000 Gross per 24 hour  Intake 1719.45 ml  Output 800 ml  Net 919.45 ml   Filed Weights   09/14/20 0158  Weight: 72.6 kg    Examination:  General exam: Appears calm and comfortable.  Currently on room air.  Sitting in couch. Respiratory system: Bilateral clear. Cardiovascular system: S1 & S2 heard, Rate controlled Gastrointestinal system: Soft.  Mild tenderness along the epigastrium.  Bowel sounds present.   Extremities: No cyanosis, clubbing; trace lower extremity edema Central nervous system: Alert and oriented. No focal neurological deficits. Moving extremities Skin: No rashes, lesions or ulcers Psychiatry: Judgement and insight appear normal. Mood & affect appropriate.     Data Reviewed: I have personally reviewed following labs and imaging studies  CBC: Recent  Labs  Lab 09/14/20 0316 09/15/20 0430 09/16/20 0434  WBC 15.4* 13.5* 11.6*  NEUTROABS 12.8*  --  10.0*  HGB 13.6 10.2* 10.2*  HCT 42.6 32.6* 32.5*  MCV 92.4 93.9 93.9  PLT 207 160 784   Basic Metabolic Panel: Recent Labs  Lab 09/14/20 0316 09/15/20 0430 09/16/20 0434  NA 137 140 136  K 3.9 4.1 4.6  CL 102 109 106  CO2 26 24 24   GLUCOSE 114* 80 108*  BUN 15 19 18   CREATININE 0.99 1.01* 0.92  CALCIUM 9.0  8.4* 8.4*  MG  --   --  2.0   GFR: Estimated Creatinine Clearance: 42.2 mL/min (by C-G formula based on SCr of 0.92 mg/dL). Liver Function Tests: Recent Labs  Lab 09/14/20 0316 09/15/20 0430 09/16/20 0434  AST 19 12* 15  ALT 15 11 11   ALKPHOS 81 70 78  BILITOT 1.3* 1.3* 1.0  PROT 6.9 5.3* 5.6*  ALBUMIN 3.5 2.7* 2.6*   Recent Labs  Lab 09/14/20 0446 09/16/20 0434  LIPASE 1,311* 47   No results for input(s): AMMONIA in the last 168 hours. Coagulation Profile: Recent Labs  Lab 09/15/20 0430  INR 1.5*   Cardiac Enzymes: No results for input(s): CKTOTAL, CKMB, CKMBINDEX, TROPONINI in the last 168 hours. BNP (last 3 results) No results for input(s): PROBNP in the last 8760 hours. HbA1C: No results for input(s): HGBA1C in the last 72 hours. CBG: No results for input(s): GLUCAP in the last 168 hours. Lipid Profile: No results for input(s): CHOL, HDL, LDLCALC, TRIG, CHOLHDL, LDLDIRECT in the last 72 hours. Thyroid Function Tests: No results for input(s): TSH, T4TOTAL, FREET4, T3FREE, THYROIDAB in the last 72 hours. Anemia Panel: No results for input(s): VITAMINB12, FOLATE, FERRITIN, TIBC, IRON, RETICCTPCT in the last 72 hours. Sepsis Labs: No results for input(s): PROCALCITON, LATICACIDVEN in the last 168 hours.  Recent Results (from the past 240 hour(s))  Resp Panel by RT-PCR (Flu A&B, Covid) Nasopharyngeal Swab     Status: None   Collection Time: 09/14/20  6:10 AM   Specimen: Nasopharyngeal Swab; Nasopharyngeal(NP) swabs in vial transport medium  Result Value Ref Range Status   SARS Coronavirus 2 by RT PCR NEGATIVE NEGATIVE Final    Comment: (NOTE) SARS-CoV-2 target nucleic acids are NOT DETECTED.  The SARS-CoV-2 RNA is generally detectable in upper respiratory specimens during the acute phase of infection. The lowest concentration of SARS-CoV-2 viral copies this assay can detect is 138 copies/mL. A negative result does not preclude SARS-Cov-2 infection and  should not be used as the sole basis for treatment or other patient management decisions. A negative result may occur with  improper specimen collection/handling, submission of specimen other than nasopharyngeal swab, presence of viral mutation(s) within the areas targeted by this assay, and inadequate number of viral copies(<138 copies/mL). A negative result must be combined with clinical observations, patient history, and epidemiological information. The expected result is Negative.  Fact Sheet for Patients:  EntrepreneurPulse.com.au  Fact Sheet for Healthcare Providers:  IncredibleEmployment.be  This test is no t yet approved or cleared by the [place] FDA and  has been authorized for detection and/or diagnosis of SARS-CoV-2 by FDA under an Emergency Use Authorization (EUA). This EUA will remain  in effect (meaning this test can be used) for the duration of the COVID-19 declaration under Section 564(b)(1) of the Act, 21 U.S.C.section 360bbb-3(b)(1), unless the authorization is terminated  or revoked sooner.       Influenza A by PCR NEGATIVE  NEGATIVE Final   Influenza B by PCR NEGATIVE NEGATIVE Final    Comment: (NOTE) The Xpert Xpress SARS-CoV-2/FLU/RSV plus assay is intended as an aid in the diagnosis of influenza from Nasopharyngeal swab specimens and should not be used as a sole basis for treatment. Nasal washings and aspirates are unacceptable for Xpert Xpress SARS-CoV-2/FLU/RSV testing.  Fact Sheet for Patients: EntrepreneurPulse.com.au  Fact Sheet for Healthcare Providers: IncredibleEmployment.be  This test is not yet approved or cleared by the [place] FDA and has been authorized for detection and/or diagnosis of SARS-CoV-2 by FDA under an Emergency Use Authorization (EUA). This EUA will remain in effect (meaning this test can be used) for the duration of the COVID-19 declaration  under Section 564(b)(1) of the Act, 21 U.S.C. section 360bbb-3(b)(1), unless the authorization is terminated or revoked.  Performed at [facility], [address] [place], [place] [address]          Radiology Studies: MR ABDOMEN MRCP W WO CONTAST  Result Date: 09/14/2020 CLINICAL DATA:  Pancreatitis, pseudocyst on CT EXAM: MRI ABDOMEN WITHOUT AND WITH CONTRAST (INCLUDING MRCP) TECHNIQUE: Multiplanar multisequence MR imaging of the abdomen was performed both before and after the administration of intravenous contrast. Heavily T2-weighted images of the biliary and pancreatic ducts were obtained, and three-dimensional MRCP images were rendered by post processing. CONTRAST:  47m GADAVIST GADOBUTROL 1 MMOL/ML IV SOLN COMPARISON:  CT abdomen/pelvis dated 09/14/2020 FINDINGS: Motion degraded images. Lower chest: Lung bases are clear. Hepatobiliary: Scattered hepatic cysts measuring up to 5.5 cm in the posterior right hepatic lobe (series 6/image 11). Gallbladder is unremarkable. No intrahepatic or extrahepatic ductal dilatation. No choledocholithiasis is seen. Pancreas: Mild peripancreatic inflammatory changes/fluid with associated dilatation of the main pancreatic duct measuring up to 7 mm, likely related to the patient's known acute pancreatitis. No evidence of pancreatic divisum. 3.0 cm unilocular cystic lesion in the pancreatic tail, compatible with a pancreatic pseudocyst, without enhancement following contrast administration. Spleen:  Within normal limits. Adrenals/Urinary Tract:  Adrenal glands are within normal limits. Bilateral hemorrhagic renal cysts (series 17/images 59 and 64), including a 2.1 cm cyst with layering hemorrhage in the lateral right lower kidney, benign (Bosniak II). No hydronephrosis. Stomach/Bowel: Stomach is within normal limits. Visualized bowel is grossly unremarkable. Vascular/Lymphatic:  No evidence of abdominal aortic aneurysm. No suspicious abdominal  lymphadenopathy. Other:  No abdominal ascites. Musculoskeletal: No focal osseous lesions. IMPRESSION: Motion degraded images. Acute pancreatitis. Associated 3 cm pseudocyst in the pancreatic tail. Bilateral hemorrhagic renal cysts, benign (Bosniak II). Electronically Signed   By: SJulian HyM.D.   On: 09/14/2020 22:30        Scheduled Meds:  amLODipine  5 mg Oral Daily   fluticasone  2 spray Each Nare Daily   hydrALAZINE  50 mg Oral TID   melatonin  3 mg Oral QHS   metoprolol tartrate  100 mg Oral BID   pantoprazole (PROTONIX) IV  40 mg Intravenous Q12H   polyethylene glycol  17 g Oral BID   Rivaroxaban  15 mg Oral Q supper   Continuous Infusions:  lactated ringers 50 mL/hr at 09/16/20 0058       Total time spent: 30 minutes   KBarb Merino MD [facility] 09/16/2020, 1:25 PM

## 2020-09-16 NOTE — Progress Notes (Signed)
Called patient's son Waunita Schooner to give him a update on his mother.  Patient will not be leaving for [facility]. on Friday for the wedding. Son is understanding and feels like patient needs more time to convalesce

## 2020-09-17 DIAGNOSIS — K859 Acute pancreatitis without necrosis or infection, unspecified: Secondary | ICD-10-CM

## 2020-09-17 DIAGNOSIS — K8500 Idiopathic acute pancreatitis without necrosis or infection: Secondary | ICD-10-CM | POA: Diagnosis not present

## 2020-09-17 HISTORY — DX: Acute pancreatitis without necrosis or infection, unspecified: K85.90

## 2020-09-17 LAB — URINALYSIS, ROUTINE W REFLEX MICROSCOPIC
Glucose, UA: NEGATIVE mg/dL
Hgb urine dipstick: NEGATIVE
Ketones, ur: NEGATIVE mg/dL
Nitrite: NEGATIVE
Protein, ur: 30 mg/dL — AB
Specific Gravity, Urine: 1.01 (ref 1.005–1.030)
WBC, UA: >50 WBC/hpf — ABNORMAL HIGH (ref 0–5)
pH: 6 (ref 5.0–8.0)

## 2020-09-17 MED ORDER — ALPRAZOLAM 0.25 MG PO TABS: 0.2500 mg | ORAL_TABLET | Freq: Every evening | ORAL | Status: DC | PRN

## 2020-09-17 MED ORDER — RIVAROXABAN 15 MG PO TABS
15.0000 mg | ORAL_TABLET | Freq: Every day | ORAL | Status: DC
  Administered 2020-09-17 – 2020-09-19 (×3): 15 mg via ORAL
  Filled 2020-09-17 (×3): qty 1

## 2020-09-17 NOTE — Progress Notes (Signed)
PT Cancellation Note  Patient Details Name: Brenda Matthews MRN: [id number] DOB: [birth year]   Cancelled Treatment:     PT order received but eval deferred this am and again in pm at request of RN - pt sedated and confused.  Will follow.   Chantrice Hagg 09/17/2020, 4:30 PM

## 2020-09-17 NOTE — Progress Notes (Signed)
Pt is confused as to time and place today.

## 2020-09-17 NOTE — Progress Notes (Signed)
Patient ID: AIZZA SANTIAGO, female   DOB: 1935/11/11, 85 y.o.   MRN: [id number]  PROGRESS NOTE    ELLAWYN WOGAN  ZSM:[id number] DOB: [birth year] DOA: 09/14/2020 PCP: [name], MD   Brief Narrative:  85 year old female with history of paroxysmal A. fib on Xarelto, chronic diastolic heart failure, hypertension, hyperlipidemia, degenerative disc disease presented with abdominal pain with nausea.  On presentation, lipase was 1311 with WBCs of 15.4.  CT of the abdomen and pelvis was suggestive of acute pancreatitis with pancreatic pseudocyst, although cystic pancreatic neoplasm was not excluded with dilation of main pancreatic duct.  She was started on IV fluids.  GI was consulted.  Assessment & Plan:   Acute pancreatitis with possible pseudocyst, probably idiopathic pancreatitis. - Triglycerides normal.  No significant history of alcohol use. -GI following.  MRI of abdomen showed acute pancreatitis with pseudocyst in the pancreatic tail.   -Biochemically improving. Tolerating soft diet.  He start mobilizing. Excess sedation with narcotics and Xanax at night, will discontinue all benzos and narcotics and let her wake up.  We will continue on low-fat diet.  Chronic diastolic CHF -Currently stable and compensated.  Strict input and output.  Daily weights.  Diuretic on hold.    Paroxysmal A. fib -Xarelto resumed.  No procedures anticipated.  Hyperlipidemia -Statin on hold.  Will resume on discharge.  Bilateral hemorrhagic renal cysts -Outpatient follow-up.  Essential hypertension -Continue amlodipine, hydralazine and metoprolol.  Blood pressure stable  DVT prophylaxis: SCDs Code Status: Full Family Communication: None. Disposition Plan: Status is: Inpatient  Remains inpatient appropriate because:Inpatient level of care appropriate due to severity of illness  Dispo: The patient is from: Home              Anticipated d/c is to: Home              Patient currently is not medically  stable to d/c.   Difficult to place patient No   Consultants: GI  Procedures: None  Antimicrobials: None   Subjective: Patient seen and examined.  Surprisingly she is so sedated and falling asleep on conversation.  She was sitting in the couch and difficult to wake up.  Otherwise remained stable.  She tells me that she is not hurting.  She tells me she does not have any nausea or vomiting.  Apparently eating low-fat diet since yesterday.  Objective: Vitals:   09/16/20 2039 09/16/20 2217 09/17/20 0544 09/17/20 0843  BP: 122/63  94/64 119/70  Pulse: 78  68 (!) 102  Resp: 16  16   Temp: 98.2 F (36.8 C)  98 F (36.7 C)   TempSrc: Oral  Oral   SpO2: 95% 92% 93% 91%  Weight:      Height:        Intake/Output Summary (Last 24 hours) at 09/17/2020 1113 Last data filed at 09/17/2020 1032 Gross per 24 hour  Intake 1414.2 ml  Output 450 ml  Net 964.2 ml   Filed Weights   09/14/20 0158  Weight: 72.6 kg    Examination: General: Sitting in couch and morning rounds.  Looks comfortable but very sleepy. Cardiovascular: S1-S2 normal.  Regular rate rhythm. Respiratory: Bilateral clear. Gastrointestinal: Soft.  Nontender.  Bowel sounds present.  No rigidity or guarding. Ext: No swelling or edema. Neuro: Sleepy.  Follows simple commands.    Data Reviewed: I have personally reviewed following labs and imaging studies  CBC: Recent Labs  Lab 09/14/20 0316 09/15/20 0430 09/16/20 0434  WBC 15.4*  13.5* 11.6*  NEUTROABS 12.8*  --  10.0*  HGB 13.6 10.2* 10.2*  HCT 42.6 32.6* 32.5*  MCV 92.4 93.9 93.9  PLT 207 160 761   Basic Metabolic Panel: Recent Labs  Lab 09/14/20 0316 09/15/20 0430 09/16/20 0434  NA 137 140 136  K 3.9 4.1 4.6  CL 102 109 106  CO2 26 24 24   GLUCOSE 114* 80 108*  BUN 15 19 18   CREATININE 0.99 1.01* 0.92  CALCIUM 9.0 8.4* 8.4*  MG  --   --  2.0   GFR: Estimated Creatinine Clearance: 42.2 mL/min (by C-G formula based on SCr of 0.92 mg/dL). Liver  Function Tests: Recent Labs  Lab 09/14/20 0316 09/15/20 0430 09/16/20 0434  AST 19 12* 15  ALT 15 11 11   ALKPHOS 81 70 78  BILITOT 1.3* 1.3* 1.0  PROT 6.9 5.3* 5.6*  ALBUMIN 3.5 2.7* 2.6*   Recent Labs  Lab 09/14/20 0446 09/16/20 0434  LIPASE 1,311* 47   No results for input(s): AMMONIA in the last 168 hours. Coagulation Profile: Recent Labs  Lab 09/15/20 0430  INR 1.5*   Cardiac Enzymes: No results for input(s): CKTOTAL, CKMB, CKMBINDEX, TROPONINI in the last 168 hours. BNP (last 3 results) No results for input(s): PROBNP in the last 8760 hours. HbA1C: No results for input(s): HGBA1C in the last 72 hours. CBG: No results for input(s): GLUCAP in the last 168 hours. Lipid Profile: No results for input(s): CHOL, HDL, LDLCALC, TRIG, CHOLHDL, LDLDIRECT in the last 72 hours. Thyroid Function Tests: No results for input(s): TSH, T4TOTAL, FREET4, T3FREE, THYROIDAB in the last 72 hours. Anemia Panel: No results for input(s): VITAMINB12, FOLATE, FERRITIN, TIBC, IRON, RETICCTPCT in the last 72 hours. Sepsis Labs: No results for input(s): PROCALCITON, LATICACIDVEN in the last 168 hours.  Recent Results (from the past 240 hour(s))  Resp Panel by RT-PCR (Flu A&B, Covid) Nasopharyngeal Swab     Status: None   Collection Time: 09/14/20  6:10 AM   Specimen: Nasopharyngeal Swab; Nasopharyngeal(NP) swabs in vial transport medium  Result Value Ref Range Status   SARS Coronavirus 2 by RT PCR NEGATIVE NEGATIVE Final    Comment: (NOTE) SARS-CoV-2 target nucleic acids are NOT DETECTED.  The SARS-CoV-2 RNA is generally detectable in upper respiratory specimens during the acute phase of infection. The lowest concentration of SARS-CoV-2 viral copies this assay can detect is 138 copies/mL. A negative result does not preclude SARS-Cov-2 infection and should not be used as the sole basis for treatment or other patient management decisions. A negative result may occur with  improper  specimen collection/handling, submission of specimen other than nasopharyngeal swab, presence of viral mutation(s) within the areas targeted by this assay, and inadequate number of viral copies(<138 copies/mL). A negative result must be combined with clinical observations, patient history, and epidemiological information. The expected result is Negative.  Fact Sheet for Patients:  EntrepreneurPulse.com.au  Fact Sheet for Healthcare Providers:  IncredibleEmployment.be  This test is no t yet approved or cleared by the [place] FDA and  has been authorized for detection and/or diagnosis of SARS-CoV-2 by FDA under an Emergency Use Authorization (EUA). This EUA will remain  in effect (meaning this test can be used) for the duration of the COVID-19 declaration under Section 564(b)(1) of the Act, 21 U.S.C.section 360bbb-3(b)(1), unless the authorization is terminated  or revoked sooner.       Influenza A by PCR NEGATIVE NEGATIVE Final   Influenza B by PCR NEGATIVE NEGATIVE Final  Comment: (NOTE) The Xpert Xpress SARS-CoV-2/FLU/RSV plus assay is intended as an aid in the diagnosis of influenza from Nasopharyngeal swab specimens and should not be used as a sole basis for treatment. Nasal washings and aspirates are unacceptable for Xpert Xpress SARS-CoV-2/FLU/RSV testing.  Fact Sheet for Patients: EntrepreneurPulse.com.au  Fact Sheet for Healthcare Providers: IncredibleEmployment.be  This test is not yet approved or cleared by the [place] FDA and has been authorized for detection and/or diagnosis of SARS-CoV-2 by FDA under an Emergency Use Authorization (EUA). This EUA will remain in effect (meaning this test can be used) for the duration of the COVID-19 declaration under Section 564(b)(1) of the Act, 21 U.S.C. section 360bbb-3(b)(1), unless the authorization is terminated or revoked.  Performed at  [facility], [address], [place], [place] [address]          Radiology Studies: No results found.      Scheduled Meds:  amLODipine  5 mg Oral Daily   fluticasone  2 spray Each Nare Daily   hydrALAZINE  50 mg Oral TID   metoprolol tartrate  100 mg Oral BID   pantoprazole (PROTONIX) IV  40 mg Intravenous Q12H   polyethylene glycol  17 g Oral BID   Continuous Infusions:  lactated ringers 50 mL/hr at 09/16/20 0058       Total time spent: 30 minutes   Barb Merino, MD [facility] 09/17/2020, 11:13 AM

## 2020-09-17 NOTE — Progress Notes (Addendum)
Progress Note Hospital Day: 4  Chief Complaint:   pancreatitis      ASSESSMENT AND PLAN   # 85 yo female with acute pancreatitis of unclear etiology but not Etoh or biliary related.  Medication related pancreatitis possible but unlikely. --MRI / MCRP 8/29 with degraded images but showing mild pancreatitis and a 3.0 cm unilocular cystic lesion in the pancreatic tail, suggestive of a pseudocyst. Seems early for a pseudocyst.  --She is tolerating low fat diet (breakfast tray at bedside) but endorses persistent LUQ pain.  --New confusion this am, see below.  --Follow up with Dr. Henrene Pastor on 10/12 at 9:40 am. Will probably need follow imaging and maybe CA19-9    # Mental status changes with confusion / lethargy. Slumped over sleeping in bedside chair. Confused when I woke her up. Not oriented to place or time. Tells me for breakfast she had the "Limited Edition with iron." Also mildly hypoxic, started on 02 per [place]. Afebrile. HR 102 --She had 2 Hydrocodone tablets and 0.5 mg of Xanax at 1:30 am which is likely cause of mental status changes  Both meds have discontinued.  --Consider utlram for pain or just one Hydrocodone at at time without concurrent use of Benzo. Another regimen?    # Constipation. No BM since Saturday despite several doses of Dulcolax.  --Had a couple of SMALL BMs ( per RN)  with BID miralax and another dose of Dulcolax yesterday.  --Continue BID Miralax.  --Adding daily Dulcolax for now  # Hx of adenomatous colon polyps. She had an advanced adenoma in 2002, a small adenoma in 2009. In 2015 (last colonoscopy) 3 adenomas removed ranging 5-10 mm in size. She cancelled her surveillance colonoscopy in 2020.  -- Ms Rynders says she would like to still have her polyp surveillance colonoscopy at some point. She occasionally expediencies very scant rectal bleeding with bowel movements.  --Her candidacy for surveillance colonoscopy can be addressed outpatient when fully recovered from  pancreatitis.    # Drop in hgb. Hgb was around baseline 12.6 - 13.6 at time of admission, now 10.2 but suspect dilutional related to IVF.  --Can repeat CBC when we see her in the office.    # Atrial fibrillation, on Xarelto.       SUBJECTIVE   confused   OBJECTIVE      Scheduled inpatient medications:   amLODipine  5 mg Oral Daily   fluticasone  2 spray Each Nare Daily   hydrALAZINE  50 mg Oral TID   metoprolol tartrate  100 mg Oral BID   pantoprazole (PROTONIX) IV  40 mg Intravenous Q12H   polyethylene glycol  17 g Oral BID   Continuous inpatient infusions:   lactated ringers 50 mL/hr at 09/16/20 0058   PRN inpatient medications: acetaminophen **OR** acetaminophen, albuterol, ALPRAZolam, bisacodyl, metoprolol tartrate, ondansetron **OR** ondansetron (ZOFRAN) IV, polyvinyl alcohol, senna-docusate  Vital signs in last 24 hours: Temp:  [98 F (36.7 C)-98.8 F (37.1 C)] 98 F (36.7 C) (09/01 0544) Pulse Rate:  [68-102] 102 (09/01 0843) Resp:  [16] 16 (09/01 0544) BP: (94-128)/(62-70) 119/70 (09/01 0843) SpO2:  [90 %-95 %] 91 % (09/01 0843) Last BM Date: 09/16/20  Intake/Output Summary (Last 24 hours) at 09/17/2020 0858 Last data filed at 09/17/2020 0600 Gross per 24 hour  Intake 1717.63 ml  Output 450 ml  Net 1267.63 ml     Physical Exam:  General: drowsy female in bedside chair.  Heart:  Slightly tachycardic, No lower  extremity edema Pulmonary: Normal respiratory effort. Diminished breath sounds bilaterally but limited ability to participate in exam  Abdomen: Soft, nondistended, nontender.at present  Normal bowel sounds.  Neurologic: Confused Psych: Not cooperative right now. Resisting transfer from chair to bed  [facility] Weights   09/14/20 0158  Weight: 72.6 kg    Intake/Output from previous day: 08/31 0701 - 09/01 0700 In: 1717.6 [P.O.:720; I.V.:997.6] Out: 450 [Urine:450] Intake/Output this shift: No intake/output data recorded.    Lab  Results: Recent Labs    09/15/20 0430 09/16/20 0434  WBC 13.5* 11.6*  HGB 10.2* 10.2*  HCT 32.6* 32.5*  PLT 160 170   BMET Recent Labs    09/15/20 0430 09/16/20 0434  NA 140 136  K 4.1 4.6  CL 109 106  CO2 24 24  GLUCOSE 80 108*  BUN 19 18  CREATININE 1.01* 0.92  CALCIUM 8.4* 8.4*   LFT Recent Labs    09/16/20 0434  PROT 5.6*  ALBUMIN 2.6*  AST 15  ALT 11  ALKPHOS 78  BILITOT 1.0   PT/INR Recent Labs    09/15/20 0430  LABPROT 17.9*  INR 1.5*   Hepatitis Panel No results for input(s): HEPBSAG, HCVAB, HEPAIGM, HEPBIGM in the last 72 hours.  No results found.   Principal Problem:   Idiopathic acute pancreatitis without necrosis or infection Active Problems:   ANEMIA   Essential hypertension   PAF (paroxysmal atrial fibrillation) (HCC)   Chronic anticoagulation   Chronic diastolic CHF (congestive heart failure) ([place])   Pancreatic cyst   Pancreatic duct dilated   Pure hypercholesterolemia     LOS: 3 days   Tye Savoy ,NP 09/17/2020, 8:58 AM    I have discussed the case with the APP, and that is the plan I formulated. I personally interviewed and examined the patient.  The bedside sitter was present for the entire visit. It was difficult to determine how Telesa feels compared to yesterday because she has become confused overnight, most likely related to some medication side effect and perhaps sleep deprivation.  She is conversational but somnolent, sitting on edge of bed.  She will not lay back for an exam, claiming "I cannot, I do not want to.  I keep saying that no one will listen to me".  It does not appear to be because of pain or other physical symptoms.  She has a low inspiratory effort, is breathing comfortably on 2-3 liters of nasal cannula oxygen.  Abdominal exam limited with her sitting up, but she does have more active bowel sounds today than yesterday.  Not tympanitic, not appear tender.  No labs done today.  I believe her acute  pancreatitis is slowly improving.  She has been tolerating a low-fat diet since midday yesterday, though her appetite is decreased and she does not seem to have eaten much.  Ongoing efforts to help relieve constipation, which is most likely result of some transient low-grade ileus related to the cryptitis as well as effect of medications and relative immobility in hospital last several days.  She keeps asking why she cannot go home today whether she can go home tomorrow, I done her best to reassure her that she needs further observation and we will make the decision day by day.  She should have some labs tomorrow and also a chest x-ray if she cannot be weaned off supplemental oxygen.   Nelida Meuse III Office: [phone]

## 2020-09-18 ENCOUNTER — Inpatient Hospital Stay (HOSPITAL_COMMUNITY): Payer: Medicare PPO

## 2020-09-18 LAB — BASIC METABOLIC PANEL
Anion gap: 6 (ref 5–15)
BUN: 19 mg/dL (ref 8–23)
CO2: 24 mmol/L (ref 22–32)
Calcium: 8.6 mg/dL — ABNORMAL LOW (ref 8.9–10.3)
Chloride: 103 mmol/L (ref 98–111)
Creatinine, Ser: 0.91 mg/dL (ref 0.44–1.00)
GFR, Estimated: >60 mL/min (ref >60)
Glucose, Bld: 104 mg/dL — ABNORMAL HIGH (ref 70–99)
Potassium: 4.3 mmol/L (ref 3.5–5.1)
Sodium: 133 mmol/L — ABNORMAL LOW (ref 135–145)

## 2020-09-18 LAB — PHOSPHORUS: Phosphorus: 2.3 mg/dL — ABNORMAL LOW (ref 2.5–4.6)

## 2020-09-18 LAB — MAGNESIUM: Magnesium: 2 mg/dL (ref 1.7–2.4)

## 2020-09-18 MED ORDER — MORPHINE SULFATE (PF) 2 MG/ML IV SOLN
1.0000 mg | INTRAVENOUS | Status: DC | PRN
  Administered 2020-09-18 (×4): 1 mg via INTRAVENOUS
  Filled 2020-09-18 (×4): qty 1

## 2020-09-18 MED ORDER — FUROSEMIDE 10 MG/ML IJ SOLN
40.0000 mg | Freq: Once | INTRAMUSCULAR | Status: AC
  Administered 2020-09-18: 40 mg via INTRAVENOUS
  Filled 2020-09-18: qty 4

## 2020-09-18 MED ORDER — TRAMADOL HCL 50 MG PO TABS
50.0000 mg | ORAL_TABLET | Freq: Four times a day (QID) | ORAL | Status: DC | PRN
  Administered 2020-09-18: 50 mg via ORAL
  Filled 2020-09-18: qty 1

## 2020-09-18 MED ORDER — MORPHINE SULFATE (PF) 2 MG/ML IV SOLN
2.0000 mg | Freq: Once | INTRAVENOUS | Status: AC
Start: 2020-09-18 — End: 2020-09-18
  Administered 2020-09-18: 2 mg via INTRAVENOUS
  Filled 2020-09-18: qty 1

## 2020-09-18 MED ORDER — FUROSEMIDE 40 MG PO TABS
40.0000 mg | ORAL_TABLET | Freq: Every day | ORAL | Status: DC
  Administered 2020-09-19: 40 mg via ORAL
  Filled 2020-09-18: qty 1

## 2020-09-18 MED ORDER — BISACODYL 5 MG PO TBEC
10.0000 mg | DELAYED_RELEASE_TABLET | Freq: Once | ORAL | Status: AC
  Administered 2020-09-18: 10 mg via ORAL
  Filled 2020-09-18: qty 2

## 2020-09-18 MED ORDER — BISACODYL 10 MG RE SUPP
10.0000 mg | Freq: Once | RECTAL | Status: AC
  Administered 2020-09-18: 10 mg via RECTAL
  Filled 2020-09-18: qty 1

## 2020-09-18 MED ORDER — SODIUM CHLORIDE 0.9 % IV SOLN
1.0000 g | INTRAVENOUS | Status: DC
  Administered 2020-09-18 – 2020-09-19 (×2): 1 g via INTRAVENOUS
  Filled 2020-09-18 (×2): qty 1
  Filled 2020-09-18: qty 10

## 2020-09-18 NOTE — TOC Initial Note (Signed)
Transition of Care [facility]) - Initial/Assessment Note    Patient Details  Name: Brenda Matthews MRN: [id number] Date of Birth: [birth year]  Transition of Care [facility]) CM/SW Contact:    Lennart Pall, LCSW Phone Number: 09/18/2020, 10:55 AM  Clinical Narrative:                Met with pt to discuss dc plans as she is currently a resident at [facility] in [place] apt (there ~ 2 1/2 yrs).  She is aware that PT recommending SNF for rehab and is in agreement with this.  Have discussed with Gardiner Rhyme, Admissions Coordinator at [facility] who notes SNF bed is available for pt when she is medically cleared for dc.  Unfortunately, if pt is not medically ready today then they cannot admit until Monday.  Per MD, not medically ready.  Will start insurance authorization in anticipation of dc Monday.    Expected Discharge Plan: Skilled Nursing Facility Barriers to Discharge: Continued Medical Work up   Patient Goals and CMS Choice Patient states their goals for this hospitalization and ongoing recovery are:: [place] following SNF rehab      Expected Discharge Plan and Services Expected Discharge Plan: [place] In-house Referral: Clinical Social Work   Post Acute Care Choice: [place] Living arrangements for the past 2 months: [place]                                      Prior Living Arrangements/Services Living arrangements for the past 2 months: [place] Lives with:: Self Patient language and need for interpreter reviewed:: Yes Do you feel safe going back to the place where you live?: Yes      Need for Family Participation in Patient Care: No (Comment) Care giver support system in place?: Yes (comment)   Criminal Activity/Legal Involvement Pertinent to Current Situation/Hospitalization: No - Comment as needed  Activities of Daily Living Home Assistive Devices/Equipment: Cane (specify quad or straight), Dentures  (specify type), Eyeglasses, Grab bars around toilet, Grab bars in shower, Shower chair without back (upper/lower dentures, single point cane) ADL Screening (condition at time of admission) Patient's cognitive ability adequate to safely complete daily activities?: Yes Is the patient deaf or have difficulty hearing?: No Does the patient have difficulty seeing, even when wearing glasses/contacts?: No Does the patient have difficulty concentrating, remembering, or making decisions?: No Patient able to express need for assistance with ADLs?: Yes Does the patient have difficulty dressing or bathing?: Yes Independently performs ADLs?: No Communication: Independent Dressing (OT): Needs assistance Is this a change from baseline?: Change from baseline, expected to last >3 days Grooming: Independent Feeding: Independent Bathing: Needs assistance Is this a change from baseline?: Change from baseline, expected to last >3 days Toileting: Independent with device (comment) In/Out Bed: Independent Walks in Home: Independent with device (comment) Does the patient have difficulty walking or climbing stairs?: Yes (secondary to weakness) Weakness of Legs: Both Weakness of Arms/Hands: None  Permission Sought/Granted Permission sought to share information with : Chartered certified accountant granted to share information with : Yes, Verbal Permission Granted     Permission granted to share info w AGENCY: [place] [facility]        Emotional Assessment Appearance:: Appears stated age Attitude/Demeanor/Rapport: Engaged Affect (typically observed): Irritable Orientation: : Oriented to Self, Oriented to Place, Oriented to  Time, Oriented  to Situation Alcohol / Substance Use: Not Applicable Psych Involvement: No (comment)  Admission diagnosis:  Acute pancreatitis [K85.90] Pancreatic pseudocyst [K86.3] Idiopathic acute pancreatitis, unspecified complication status [O71.21] Patient  Active Problem List   Diagnosis Date Noted   Idiopathic acute pancreatitis without necrosis or infection 09/14/2020   Pancreatic cyst 09/14/2020   Pancreatic duct dilated 09/14/2020   Pure hypercholesterolemia 09/14/2020   Hypertensive heart and chronic kidney disease with heart failure and stage 1 through stage 4 chronic kidney disease, or unspecified chronic kidney disease ([place]) 09/28/2018   Spinal stenosis, lumbar region with neurogenic claudication 09/26/2018   Osteoarthritis of right knee 11/21/2017   Persistent atrial fibrillation 10/12/2017   Chronic diastolic CHF (congestive heart failure) (HCC)    S/P thoracentesis    Acute respiratory failure (HCC)    Non-ST elevation (NSTEMI) myocardial infarction ([place])    Sepsis ([place]) 08/30/2017   Acute-on-chronic kidney injury ([place]) 08/30/2017   Rash in adult 08/30/2017   Atrial fibrillation and flutter ([place])    Chest pain 08/29/2017   DOE (dyspnea on exertion) 08/29/2017   Arterial hypotension    Chronic anticoagulation 12/19/2016   AKI (acute kidney injury) ([place])    Low TSH level 02/19/2016   PAF (paroxysmal atrial fibrillation) ([place]) 02/18/2016   Ankle fracture, left-s/p surgery 02/18/16 02/18/2016   Closed displaced trimalleolar fracture of left ankle 02/18/2016   Acute bronchitis 02/10/2015   Bronchopneumonia 12/02/2013   Chronic renal insufficiency, stage II (mild) 12/04/2012   Hx of adenomatous colonic polyps    Routine general medical examination at a health care facility 09/05/2011   Cervical cancer screening 02/24/2011   SCIATICA, BILATERAL 09/01/2009   SACROILIAC STRAIN, ACUTE 09/01/2009   ANEMIA 11/07/2007   Osteopenia of the elderly 11/07/2007   INSOMNIA 11/07/2007   EDEMA 08/14/2007   Hyperlipidemia 04/04/2007   PANIC DISORDER 04/04/2007   Essential hypertension 04/04/2007   Anxiety state 12/27/2006   ARTHRITIS 12/27/2006   PCP:  [name], MD Pharmacy:   [place], [place] - [address] [place] [address] Phone: [phone] Fax: [phone]  [place], [place] - [address] [place] [address] Phone: [phone] Fax: [phone]     Social Determinants of Health (SDOH) Interventions    Readmission Risk Interventions No flowsheet data found.

## 2020-09-18 NOTE — NC FL2 (Signed)
[place] LEVEL OF CARE SCREENING TOOL     IDENTIFICATION  Patient Name: Brenda Matthews Birthdate: May 25, 1935 Sex: female Admission Date (Current Location): 09/14/2020  [facility] and [place] Number:  Herbalist and Address:  [facility],  [place], [place]      Provider Number: [id number]  Attending Physician Name and Address:  Barb Merino, MD  Relative Name and Phone Number:  son, Asaiah Hunnicutt [phone]    Current Level of Care: Hospital Recommended Level of Care: [place] Prior Approval Number:    Date Approved/Denied:   PASRR Number: [id number]  Discharge Plan: SNF    Current Diagnoses: Patient Active Problem List   Diagnosis Date Noted   Idiopathic acute pancreatitis without necrosis or infection 09/14/2020   Pancreatic cyst 09/14/2020   Pancreatic duct dilated 09/14/2020   Pure hypercholesterolemia 09/14/2020   Hypertensive heart and chronic kidney disease with heart failure and stage 1 through stage 4 chronic kidney disease, or unspecified chronic kidney disease ([place]) 09/28/2018   Spinal stenosis, lumbar region with neurogenic claudication 09/26/2018   Osteoarthritis of right knee 11/21/2017   Persistent atrial fibrillation 10/12/2017   Chronic diastolic CHF (congestive heart failure) ([place])    S/P thoracentesis    Acute respiratory failure ([place])    Non-ST elevation (NSTEMI) myocardial infarction ([place])    Sepsis ([place]) 08/30/2017   Acute-on-chronic kidney injury ([place]) 08/30/2017   Rash in adult 08/30/2017   Atrial fibrillation and flutter ([place])    Chest pain 08/29/2017   DOE (dyspnea on exertion) 08/29/2017   Arterial hypotension    Chronic anticoagulation 12/19/2016   AKI (acute kidney injury) ([place])    Low TSH level 02/19/2016   PAF (paroxysmal atrial fibrillation) ([place]) 02/18/2016   Ankle fracture, left-s/p surgery 02/18/16 02/18/2016   Closed displaced trimalleolar fracture of left ankle  02/18/2016   Acute bronchitis 02/10/2015   Bronchopneumonia 12/02/2013   Chronic renal insufficiency, stage II (mild) 12/04/2012   Hx of adenomatous colonic polyps    Routine general medical examination at a health care facility 09/05/2011   Cervical cancer screening 02/24/2011   SCIATICA, BILATERAL 09/01/2009   SACROILIAC STRAIN, ACUTE 09/01/2009   ANEMIA 11/07/2007   Osteopenia of the elderly 11/07/2007   INSOMNIA 11/07/2007   EDEMA 08/14/2007   Hyperlipidemia 04/04/2007   PANIC DISORDER 04/04/2007   Essential hypertension 04/04/2007   Anxiety state 12/27/2006   ARTHRITIS 12/27/2006    Orientation RESPIRATION BLADDER Height & Weight     Self, Time, Situation, Place  Normal Continent Weight: 160 lb (72.6 kg) Height:  5' 1"  (154.9 cm)  BEHAVIORAL SYMPTOMS/MOOD NEUROLOGICAL BOWEL NUTRITION STATUS      Continent    AMBULATORY STATUS COMMUNICATION OF NEEDS Skin   Limited Assist Verbally Normal                       Personal Care Assistance Level of Assistance  Bathing, Dressing Bathing Assistance: Limited assistance   Dressing Assistance: Limited assistance     Functional Limitations Info             SPECIAL CARE FACTORS FREQUENCY  PT (By licensed PT), OT (By licensed OT)     PT Frequency: 5x/wk OT Frequency: 5x/wk            Contractures Contractures Info: Not present    Additional Factors Info  Current Medications (09/18/2020):  This is the current hospital active medication list Current Facility-Administered Medications  Medication Dose Route Frequency Provider Last Rate Last Admin   acetaminophen (TYLENOL) tablet 650 mg  650 mg Oral Q6H PRN Tacey Ruiz, MD   650 mg at 09/18/20 0719   Or   acetaminophen (TYLENOL) suppository 650 mg  650 mg Rectal Q6H PRN Tacey Ruiz, MD       albuterol (PROVENTIL) (2.5 MG/3ML) 0.083% nebulizer solution 2.5 mg  2.5 mg Nebulization Q4H PRN Tacey Ruiz, MD   2.5 mg at 09/18/20 0543    ALPRAZolam (XANAX) tablet 0.25 mg  0.25 mg Oral QHS PRN Barb Merino, MD       amLODipine (NORVASC) tablet 5 mg  5 mg Oral Daily Tacey Ruiz, MD   5 mg at 09/16/20 1537   bisacodyl (DULCOLAX) EC tablet 5 mg  5 mg Oral Daily PRN Tacey Ruiz, MD   5 mg at 09/14/20 1312   cefTRIAXone (ROCEPHIN) 1 g in sodium chloride 0.9 % 100 mL IVPB  1 g Intravenous Q24H Barb Merino, MD 200 mL/hr at 09/18/20 1038 1 g at 09/18/20 1038   fluticasone (FLONASE) 50 MCG/ACT nasal spray 2 spray  2 spray Each Nare Daily Tacey Ruiz, MD   2 spray at 09/18/20 9432   hydrALAZINE (APRESOLINE) tablet 50 mg  50 mg Oral TID Tacey Ruiz, MD   50 mg at 09/17/20 2211   lactated ringers infusion   Intravenous Continuous Kathryne Eriksson, NP 50 mL/hr at 09/17/20 1353 New Bag at 09/17/20 1353   metoprolol tartrate (LOPRESSOR) injection 5 mg  5 mg Intravenous Q6H PRN Tacey Ruiz, MD       metoprolol tartrate (LOPRESSOR) tablet 100 mg  100 mg Oral BID Tacey Ruiz, MD   100 mg at 09/17/20 2211   morphine 2 MG/ML injection 1 mg  1 mg Intravenous Q2H PRN Barb Merino, MD   1 mg at 09/18/20 1037   ondansetron (ZOFRAN) tablet 4 mg  4 mg Oral Q6H PRN Tacey Ruiz, MD       Or   ondansetron (ZOFRAN) injection 4 mg  4 mg Intravenous Q6H PRN Tacey Ruiz, MD       pantoprazole (PROTONIX) injection 40 mg  40 mg Intravenous Q12H Tacey Ruiz, MD   40 mg at 09/18/20 0818   polyethylene glycol (MIRALAX / GLYCOLAX) packet 17 g  17 g Oral BID Willia Craze, NP   17 g at 09/18/20 7614   polyvinyl alcohol (LIQUIFILM TEARS) 1.4 % ophthalmic solution 2 drop  2 drop Both Eyes Daily PRN Tacey Ruiz, MD       Rivaroxaban (XARELTO) tablet 15 mg  15 mg Oral Q supper Barb Merino, MD   15 mg at 09/17/20 1735   senna-docusate (Senokot-S) tablet 1 tablet  1 tablet Oral QHS PRN Tacey Ruiz, MD         Discharge Medications: Please see discharge summary for a list of discharge medications.  Relevant Imaging  Results:  Relevant Lab Results:   Additional Information SSN: [id number]  Lennart Pall, LCSW

## 2020-09-18 NOTE — Evaluation (Signed)
Physical Therapy Evaluation Patient Details Name: Brenda Matthews MRN: [id number] DOB: 10-26-1935 Today's Date: 09/18/2020   History of Present Illness  85 year old female with history of paroxysmal A. fib on Xarelto, chronic diastolic heart failure, hypertension, hyperlipidemia, degenerative disc disease presented with abdominal pain with nausea.  On presentation, lipase was 1311 with WBCs of 15.4.  CT of the abdomen and pelvis was suggestive of acute pancreatitis with pancreatic pseudocyst, although cystic pancreatic neoplasm was not excluded with dilation of main pancreatic duct.  Clinical Impression  Patient presents with decreased mobility due to pain, limited activity tolerance with HR in the 140's at rest at times and with activity.  She also demonstrates an expiratory wheeze after ambulation with edema in LE's (Reports wearing compression stockings at home.)  SpO2 throughout on RA 93 or above.  Patient frustrated with care and feels she hasn't had her needs met.  Attempt to calm her and suggested decreasing her responses to issues that are in the past.  She hopes for d/c today to skilled rehab at [facility].  PT will follow up if not d/c.     Follow Up Recommendations SNF    Equipment Recommendations  None recommended by PT    Recommendations for Other Services       Precautions / Restrictions Precautions Precautions: Fall      Mobility  Bed Mobility               General bed mobility comments: up in recliner    Transfers Overall transfer level: Needs assistance Equipment used: Rolling walker (2 wheeled) Transfers: Sit to/from Stand Sit to Stand: Min guard         General transfer comment: cues for safety with stand to sit  Ambulation/Gait Ambulation/Gait assistance: Min guard Gait Distance (Feet): 120 Feet Assistive device: Rolling walker (2 wheeled) Gait Pattern/deviations: Step-through pattern;Step-to pattern;Decreased stride length;Shuffle     General  Gait Details: assist for safety and balance, needs cues for turning, patient with HR 117-143 at rest and with ambulation, mild expiratory wheeze noted after ambulation, RN aware  Stairs            Wheelchair Mobility    Modified Rankin (Stroke Patients Only)       Balance Overall balance assessment: Needs assistance   Sitting balance-Leahy Scale: Good     Standing balance support: Bilateral upper extremity supported Standing balance-Leahy Scale: Fair Standing balance comment: UE support needed for pain                             Pertinent Vitals/Pain Pain Assessment: Faces Faces Pain Scale: Hurts even more Pain Location: L side Pain Descriptors / Indicators: Guarding;Discomfort Pain Intervention(s): Monitored during session;Repositioned    Home Living Family/patient expects to be discharged to:: Private residence Living Arrangements: Alone   Type of Home: Independent living facility Home Access: Level entry;Elevator     Home Layout: One level Home Equipment: Cane - single point;Walker - 2 wheels Additional Comments: from [facility]    Prior Function Level of Independence: Independent with assistive device(s)         Comments: as driving for groceries, but eats most meals in the dining facility, uses walker     Hand Dominance        Extremity/Trunk Assessment        Lower Extremity Assessment Lower Extremity Assessment: LLE deficits/detail;RLE deficits/detail RLE Deficits / Details: edema throughout, strength hip flexion 3-/5,  knee extension 4-/5, ankle DF 4/5 LLE Deficits / Details: edema throughout, strength hip flexion 3/5, knee extension 4/5, ankle DF 4/5    Cervical / Trunk Assessment Cervical / Trunk Assessment: Kyphotic  Communication   Communication: No difficulties  Cognition Arousal/Alertness: Awake/alert Behavior During Therapy: Anxious;Agitated Overall Cognitive Status: Impaired/Different from baseline Area of  Impairment: Orientation                 Orientation Level: Disoriented to;Time             General Comments: frustrated at care and adamant to leave today, was thinking it was [place] even though stated has been here 5 days      General Comments General comments (skin integrity, edema, etc.): edema in LE's and pt reported uses compression stockings at home, knee hi TEDS placed and RN aware    Exercises General Exercises - Lower Extremity Ankle Circles/Pumps: AROM;15 reps;Seated   Assessment/Plan    PT Assessment Patient needs continued PT services  PT Problem List Decreased strength;Decreased mobility;Decreased activity tolerance;Decreased balance;Decreased knowledge of use of DME;Pain       PT Treatment Interventions DME instruction;Therapeutic activities;Gait training;Therapeutic exercise;Patient/family education;Balance training;Functional mobility training    PT Goals (Current goals can be found in the Care Plan section)  Acute Rehab PT Goals Patient Stated Goal: to go to rehab today PT Goal Formulation: With patient Time For Goal Achievement: 10/02/20 Potential to Achieve Goals: Good    Frequency Min 2X/week   Barriers to discharge        Co-evaluation               AM-PAC PT "6 Clicks" Mobility  Outcome Measure Help needed turning from your back to your side while in a flat bed without using bedrails?: A Little Help needed moving from lying on your back to sitting on the side of a flat bed without using bedrails?: A Little Help needed moving to and from a bed to a chair (including a wheelchair)?: A Little Help needed standing up from a chair using your arms (e.g., wheelchair or bedside chair)?: A Little Help needed to walk in hospital room?: A Little Help needed climbing 3-5 steps with a railing? : A Lot 6 Click Score: 17    End of Session Equipment Utilized During Treatment: Gait belt Activity Tolerance: Patient tolerated treatment  well Patient left: in chair;with call bell/phone within reach;with chair alarm set   PT Visit Diagnosis: Other abnormalities of gait and mobility (R26.89);Muscle weakness (generalized) (M62.81)    Time: 5956-3875 PT Time Calculation (min) (ACUTE ONLY): 38 min   Charges:   PT Evaluation $PT Eval Moderate Complexity: 1 Mod PT Treatments $Gait Training: 8-22 mins $Therapeutic Activity: 8-22 mins        Magda Kiel, PT Acute Rehabilitation Services IEPPI:[phone] Office:[phone] 09/18/2020   Brenda Matthews 09/18/2020, 10:38 AM

## 2020-09-18 NOTE — Progress Notes (Signed)
ExaminedPatient ID: Brenda Matthews, female   DOB: 1935/12/18, 85 y.o.   MRN: [id number]  PROGRESS NOTE    DRU LAUREL  LYY:[id number] DOB: Feb 23, 1935 DOA: 09/14/2020 PCP: [name], MD   Brief Narrative:  85 year old female with history of paroxysmal A. fib on Xarelto, chronic diastolic heart failure, hypertension, hyperlipidemia, degenerative disc disease presented with abdominal pain with nausea.  On presentation, lipase was 1311 with WBCs of 15.4.  CT of the abdomen and pelvis was suggestive of acute pancreatitis with pancreatic pseudocyst, although cystic pancreatic neoplasm was not excluded with dilation of main pancreatic duct.  She was started on IV fluids.  GI was consulted. After some clinical recovery, patient had period of agitation and anxiousness, became impulsive and confused after use of narcotics and Xanax.  Also developed UTI.  Assessment & Plan:   Acute pancreatitis with pseudocyst, probably idiopathic pancreatitis. - Triglycerides normal.  No significant history of alcohol use. -GI following.  MRI of abdomen showed acute pancreatitis with pseudocyst in the pancreatic tail.   -Biochemically improving. -Tolerating diet. -Good bowel movements after use of aggressive bowel regimen.  Continue. -Excessive sleepiness with combination of narcotics and benzos, improved today.  For her left flank/costal pain will use low-dose morphine 1 mg IV.  Will encouraged to use tramadol to avoid IV narcotics if possible.  Chronic diastolic CHF -Currently stable and compensated.  Strict input and output.  Daily weights.   -Discontinue IV fluids.  She has adequate intake.  1 dose of IV Lasix today.  Will resume home dose of oral Lasix 40 mg daily starting tomorrow.   Paroxysmal A. fib -Xarelto resumed.  No procedures anticipated.  Currently on metoprolol.  Hyperlipidemia -Statin on hold.  Will resume on discharge.  Bilateral hemorrhagic renal cysts -Outpatient follow-up.  Essential  hypertension -Continue amlodipine, hydralazine and metoprolol.  Blood pressure stable.  Acute UTI, not present on admission -Urinalysis with grossly infected urine.  Started on Rocephin.  We will continue until final urine cultures.  DVT prophylaxis: SCDs Code Status: Full Family Communication: Son on the phone. Disposition Plan: Status is: Inpatient  Remains inpatient appropriate because:Inpatient level of care appropriate due to severity of illness  Dispo: The patient is from: Home              Anticipated d/c is to: Skilled nursing facility.  Anticipate tomorrow.              Patient currently is not medically stable to d/c.   Difficult to place patient No   Consultants: GI  Procedures: None  Antimicrobials: None   Subjective: Patient seen and examined.  Today she was very frustrated and angry about not addressing her left flank pain.  She was very sedated yesterday and pain medications were discontinued.  All night, unfortunately she did not receive any pain medicine. Complained of left-sided lower chest wall and flank pain, 10 out of 10.  Denies any shortness of breath or cough.  Tolerating soft diet without any nausea or vomiting.  Also complaining about not having bowel movement for the last 5 days.  Wanting to go home. Detail conversation with patient and her son on the phone. Denies any dysuria.  Afebrile.  A chest x-ray was done that shows left-sided pleural effusion, probably transudate.  No evidence of pneumonia.  Objective: Vitals:   09/17/20 2013 09/17/20 2233 09/18/20 0206 09/18/20 0641  BP: 139/89 121/79 115/72 109/73  Pulse: 67 (!) 110 (!) 108 98  Resp: 18  18 16  Temp: 97.9 F (36.6 C)  (!) 97.5 F (36.4 C) 99 F (37.2 C)  TempSrc:    Oral  SpO2: 99% 95% 93% 98%  Weight:      Height:        Intake/Output Summary (Last 24 hours) at 09/18/2020 1342 Last data filed at 09/18/2020 0700 Gross per 24 hour  Intake 1094.22 ml  Output 875 ml  Net 219.22 ml    Filed Weights   09/14/20 0158  Weight: 72.6 kg    Examination: General: Sitting in chair.  Looks comfortable but frustrated.   Alert oriented x4.  Slightly impulsive and anxious. Cardiovascular: S1-S2 normal.  Regular rate rhythm. Respiratory: Bilateral clear. Gastrointestinal: Soft.  Nontender.  Bowel sounds present.  No rigidity or guarding. Ext: No swelling or edema.     Data Reviewed: I have personally reviewed following labs and imaging studies  CBC: Recent Labs  Lab 09/14/20 0316 09/15/20 0430 09/16/20 0434  WBC 15.4* 13.5* 11.6*  NEUTROABS 12.8*  --  10.0*  HGB 13.6 10.2* 10.2*  HCT 42.6 32.6* 32.5*  MCV 92.4 93.9 93.9  PLT 207 160 102   Basic Metabolic Panel: Recent Labs  Lab 09/14/20 0316 09/15/20 0430 09/16/20 0434 09/18/20 0426  NA 137 140 136 133*  K 3.9 4.1 4.6 4.3  CL 102 109 106 103  CO2 26 24 24 24   GLUCOSE 114* 80 108* 104*  BUN 15 19 18 19   CREATININE 0.99 1.01* 0.92 0.91  CALCIUM 9.0 8.4* 8.4* 8.6*  MG  --   --  2.0 2.0  PHOS  --   --   --  2.3*   GFR: Estimated Creatinine Clearance: 42.7 mL/min (by C-G formula based on SCr of 0.91 mg/dL). Liver Function Tests: Recent Labs  Lab 09/14/20 0316 09/15/20 0430 09/16/20 0434  AST 19 12* 15  ALT 15 11 11   ALKPHOS 81 70 78  BILITOT 1.3* 1.3* 1.0  PROT 6.9 5.3* 5.6*  ALBUMIN 3.5 2.7* 2.6*   Recent Labs  Lab 09/14/20 0446 09/16/20 0434  LIPASE 1,311* 47   No results for input(s): AMMONIA in the last 168 hours. Coagulation Profile: Recent Labs  Lab 09/15/20 0430  INR 1.5*   Cardiac Enzymes: No results for input(s): CKTOTAL, CKMB, CKMBINDEX, TROPONINI in the last 168 hours. BNP (last 3 results) No results for input(s): PROBNP in the last 8760 hours. HbA1C: No results for input(s): HGBA1C in the last 72 hours. CBG: No results for input(s): GLUCAP in the last 168 hours. Lipid Profile: No results for input(s): CHOL, HDL, LDLCALC, TRIG, CHOLHDL, LDLDIRECT in the last 72  hours. Thyroid Function Tests: No results for input(s): TSH, T4TOTAL, FREET4, T3FREE, THYROIDAB in the last 72 hours. Anemia Panel: No results for input(s): VITAMINB12, FOLATE, FERRITIN, TIBC, IRON, RETICCTPCT in the last 72 hours. Sepsis Labs: No results for input(s): PROCALCITON, LATICACIDVEN in the last 168 hours.  Recent Results (from the past 240 hour(s))  Resp Panel by RT-PCR (Flu A&B, Covid) Nasopharyngeal Swab     Status: None   Collection Time: 09/14/20  6:10 AM   Specimen: Nasopharyngeal Swab; Nasopharyngeal(NP) swabs in vial transport medium  Result Value Ref Range Status   SARS Coronavirus 2 by RT PCR NEGATIVE NEGATIVE Final    Comment: (NOTE) SARS-CoV-2 target nucleic acids are NOT DETECTED.  The SARS-CoV-2 RNA is generally detectable in upper respiratory specimens during the acute phase of infection. The lowest concentration of SARS-CoV-2 viral copies this assay can detect  is 138 copies/mL. A negative result does not preclude SARS-Cov-2 infection and should not be used as the sole basis for treatment or other patient management decisions. A negative result may occur with  improper specimen collection/handling, submission of specimen other than nasopharyngeal swab, presence of viral mutation(s) within the areas targeted by this assay, and inadequate number of viral copies(<138 copies/mL). A negative result must be combined with clinical observations, patient history, and epidemiological information. The expected result is Negative.  Fact Sheet for Patients:  EntrepreneurPulse.com.au  Fact Sheet for Healthcare Providers:  IncredibleEmployment.be  This test is no t yet approved or cleared by the [place] FDA and  has been authorized for detection and/or diagnosis of SARS-CoV-2 by FDA under an Emergency Use Authorization (EUA). This EUA will remain  in effect (meaning this test can be used) for the duration of the COVID-19  declaration under Section 564(b)(1) of the Act, 21 U.S.C.section 360bbb-3(b)(1), unless the authorization is terminated  or revoked sooner.       Influenza A by PCR NEGATIVE NEGATIVE Final   Influenza B by PCR NEGATIVE NEGATIVE Final    Comment: (NOTE) The Xpert Xpress SARS-CoV-2/FLU/RSV plus assay is intended as an aid in the diagnosis of influenza from Nasopharyngeal swab specimens and should not be used as a sole basis for treatment. Nasal washings and aspirates are unacceptable for Xpert Xpress SARS-CoV-2/FLU/RSV testing.  Fact Sheet for Patients: EntrepreneurPulse.com.au  Fact Sheet for Healthcare Providers: IncredibleEmployment.be  This test is not yet approved or cleared by the [place] FDA and has been authorized for detection and/or diagnosis of SARS-CoV-2 by FDA under an Emergency Use Authorization (EUA). This EUA will remain in effect (meaning this test can be used) for the duration of the COVID-19 declaration under Section 564(b)(1) of the Act, 21 U.S.C. section 360bbb-3(b)(1), unless the authorization is terminated or revoked.  Performed at [facility], [address], [place], [place] [address]          Radiology Studies: DG CHEST PORT 1 VIEW  Result Date: 09/18/2020 CLINICAL DATA:  85 year old female with history of paroxysmal atrial fibrillation on Xarelto, history of chronic diastolic heart failure and hypertension. EXAM: PORTABLE CHEST 1 VIEW COMPARISON:  October 13, 2017. FINDINGS: Trachea midline. Cardiomediastinal contours with persistent cardiac enlargement. Persistent LEFT-sided effusion and basilar airspace disease. Juxta diaphragmatic atelectasis with diminished pleural fluid in the RIGHT chest compared to the prior study. No new area of consolidative changes. No visible pneumothorax. On limited assessment there is no acute skeletal process. IMPRESSION: Persistent LEFT pleural effusion and basilar  airspace disease. Juxta diaphragmatic atelectasis on the RIGHT with diminished pleural fluid in the RIGHT chest. Electronically Signed   By: Zetta Bills M.D.   On: 09/18/2020 09:21        Scheduled Meds:  amLODipine  5 mg Oral Daily   fluticasone  2 spray Each Nare Daily   [START ON 09/19/2020] furosemide  40 mg Oral Daily   hydrALAZINE  50 mg Oral TID   metoprolol tartrate  100 mg Oral BID   pantoprazole (PROTONIX) IV  40 mg Intravenous Q12H   polyethylene glycol  17 g Oral BID   rivaroxaban  15 mg Oral Q supper   Continuous Infusions:  cefTRIAXone (ROCEPHIN)  IV 1 g (09/18/20 1038)       Total time spent: 30 minutes   Barb Merino, MD [facility] 09/18/2020, 1:42 PM

## 2020-09-18 NOTE — Progress Notes (Addendum)
Progress Note Hospital Day: 5  Chief Complaint:  pancreatitis       ASSESSMENT AND PLAN   # 85 yo female with acute pancreatitis of unclear etiology but not Etoh or biliary related.  Medication related pancreatitis possible but unlikely. --MRI / MCRP 8/29 with degraded images but showing mild pancreatitis and a 3.0 cm unilocular cystic lesion in the pancreatic tail, suggestive of a pseudocyst. Seems early for a pseudocyst.  --Emotional today. Feels like pain not controlled. She is tolerating low fat diet but endorses ongoing sharp LUQ pain not relieved with Tylenol.  She is moderately tender in LUQ.  --Any contraindication to trying Ultram? She had significant mental status changes with hydrocodone ( though took two tablets + Xanax).  --Follow up with Dr. Henrene Pastor on 10/12 at 9:40 am. Will probably need follow imaging and maybe [id number]      # Constipation. No BM since Saturday despite several doses of Dulcolax.  --No significant BM since Saturday ( 6 days ago). Continue BID, give 15 mg of Dulcolax now. Patient says she is able to retain Dulcolax suppositories.     # Hx of adenomatous colon polyps. She had an advanced adenoma in 2002, a small adenoma in 2009. In 2015 (last colonoscopy) 3 adenomas removed ranging 5-10 mm in size. She cancelled her surveillance colonoscopy in 2020.  -- Ms Mclear says she would like to still have her polyp surveillance colonoscopy at some point. She occasionally expediencies very scant rectal bleeding with bowel movements.  --Her candidacy for surveillance colonoscopy can be addressed outpatient when fully recovered from pancreatitis.   # Probable UTI, starting Rocephin   # Drop in hgb. Hgb was around baseline 12.6 - 13.6 at time of admission, now 10.2 but suspect dilutional related to IVF.  --Can repeat CBC when we see her in the office.    # Atrial fibrillation, on Xarelto.     DIAGNOSTIC STUDIES   FINDINGS: Motion degraded images.   Lower chest:  Lung bases are clear.   Hepatobiliary: Scattered hepatic cysts measuring up to 5.5 cm in the posterior right hepatic lobe (series 6/image 11).   Gallbladder is unremarkable. No intrahepatic or extrahepatic ductal dilatation. No choledocholithiasis is seen.   Pancreas: Mild peripancreatic inflammatory changes/fluid with associated dilatation of the main pancreatic duct measuring up to 7 mm, likely related to the patient's known acute pancreatitis. No evidence of pancreatic divisum. 3.0 cm unilocular cystic lesion in the pancreatic tail, compatible with a pancreatic pseudocyst, without enhancement following contrast administration.   Spleen:  Within normal limits.   Adrenals/Urinary Tract:  Adrenal glands are within normal limits.   Bilateral hemorrhagic renal cysts (series 17/images 59 and 64), including a 2.1 cm cyst with layering hemorrhage in the lateral right lower kidney, benign (Bosniak II). No hydronephrosis.   Stomach/Bowel: Stomach is within normal limits.   Visualized bowel is grossly unremarkable.   Vascular/Lymphatic:  No evidence of abdominal aortic aneurysm.   No suspicious abdominal lymphadenopathy.   Other:  No abdominal ascites.   Musculoskeletal: No focal osseous lesions.   IMPRESSION: Motion degraded images. Acute pancreatitis. Associated 3 cm pseudocyst in the pancreatic tail. Bilateral hemorrhagic renal cysts, benign (Bosniak II).  SUBJECTIVE   Still having sharp left upper abdominal pain. She is tolerating low fat diet. Upset about persistent constipation. Patient says she will be starting antibiotics soon ( Rocephin has been ordered)     OBJECTIVE      Scheduled inpatient medications:  amLODipine  5 mg Oral Daily   fluticasone  2 spray Each Nare Daily   hydrALAZINE  50 mg Oral TID   metoprolol tartrate  100 mg Oral BID   pantoprazole (PROTONIX) IV  40 mg Intravenous Q12H   polyethylene glycol  17 g Oral BID   rivaroxaban  15 mg Oral Q supper    Continuous inpatient infusions:   cefTRIAXone (ROCEPHIN)  IV     lactated ringers 50 mL/hr at 09/17/20 1353   PRN inpatient medications: acetaminophen **OR** acetaminophen, albuterol, ALPRAZolam, bisacodyl, metoprolol tartrate, morphine injection, ondansetron **OR** ondansetron (ZOFRAN) IV, polyvinyl alcohol, senna-docusate  Vital signs in last 24 hours: Temp:  [97.5 F (36.4 C)-99 F (37.2 C)] 99 F (37.2 C) (09/02 0641) Pulse Rate:  [67-158] 98 (09/02 0641) Resp:  [16-18] 16 (09/02 0641) BP: (109-139)/(72-89) 109/73 (09/02 0641) SpO2:  [93 %-99 %] 98 % (09/02 0641) Last BM Date: 09/17/20  Intake/Output Summary (Last 24 hours) at 09/18/2020 1010 Last data filed at 09/18/2020 0700 Gross per 24 hour  Intake 1214.22 ml  Output 1025 ml  Net 189.22 ml     Physical Exam:  General: Alert female in NAD Heart:  Regular rate. No lower extremity edema Pulmonary: Normal respiratory effort, 2+ BLE edema.  Abdomen: Soft, moderate LUQ tenderness. Normal bowel sounds.  Neurologic: Alert and oriented Psych: Emotional / slightly agitated but cooperative.   Filed Weights   09/14/20 0158  Weight: 72.6 kg    Intake/Output from previous day: 09/01 0701 - 09/02 0700 In: 1414.2 [P.O.:240; I.V.:1174.2] Out: 1025 [Urine:1025] Intake/Output this shift: No intake/output data recorded.    Lab Results: Recent Labs    09/16/20 0434  WBC 11.6*  HGB 10.2*  HCT 32.5*  PLT 170   BMET Recent Labs    09/16/20 0434 09/18/20 0426  NA 136 133*  K 4.6 4.3  CL 106 103  CO2 24 24  GLUCOSE 108* 104*  BUN 18 19  CREATININE 0.92 0.91  CALCIUM 8.4* 8.6*   LFT Recent Labs    09/16/20 0434  PROT 5.6*  ALBUMIN 2.6*  AST 15  ALT 11  ALKPHOS 78  BILITOT 1.0   PT/INR No results for input(s): LABPROT, INR in the last 72 hours. Hepatitis Panel No results for input(s): HEPBSAG, HCVAB, HEPAIGM, HEPBIGM in the last 72 hours.  No results found.   Principal Problem:   Idiopathic acute  pancreatitis without necrosis or infection Active Problems:   ANEMIA   Essential hypertension   PAF (paroxysmal atrial fibrillation) (HCC)   Chronic anticoagulation   Chronic diastolic CHF (congestive heart failure) (HCC)   Pancreatic cyst   Pancreatic duct dilated   Pure hypercholesterolemia     LOS: 4 days   Tye Savoy ,NP 09/18/2020, 10:10 AM

## 2020-09-19 DIAGNOSIS — D649 Anemia, unspecified: Secondary | ICD-10-CM | POA: Diagnosis not present

## 2020-09-19 DIAGNOSIS — K8689 Other specified diseases of pancreas: Secondary | ICD-10-CM | POA: Diagnosis not present

## 2020-09-19 DIAGNOSIS — E785 Hyperlipidemia, unspecified: Secondary | ICD-10-CM | POA: Diagnosis not present

## 2020-09-19 DIAGNOSIS — R1013 Epigastric pain: Secondary | ICD-10-CM | POA: Diagnosis not present

## 2020-09-19 DIAGNOSIS — K859 Acute pancreatitis without necrosis or infection, unspecified: Secondary | ICD-10-CM | POA: Diagnosis not present

## 2020-09-19 DIAGNOSIS — N1831 Chronic kidney disease, stage 3a: Secondary | ICD-10-CM | POA: Diagnosis not present

## 2020-09-19 DIAGNOSIS — N182 Chronic kidney disease, stage 2 (mild): Secondary | ICD-10-CM | POA: Diagnosis not present

## 2020-09-19 DIAGNOSIS — I4892 Unspecified atrial flutter: Secondary | ICD-10-CM | POA: Diagnosis not present

## 2020-09-19 DIAGNOSIS — K5900 Constipation, unspecified: Secondary | ICD-10-CM | POA: Diagnosis not present

## 2020-09-19 DIAGNOSIS — Z1231 Encounter for screening mammogram for malignant neoplasm of breast: Secondary | ICD-10-CM | POA: Diagnosis not present

## 2020-09-19 DIAGNOSIS — R933 Abnormal findings on diagnostic imaging of other parts of digestive tract: Secondary | ICD-10-CM

## 2020-09-19 DIAGNOSIS — Z780 Asymptomatic menopausal state: Secondary | ICD-10-CM | POA: Diagnosis not present

## 2020-09-19 DIAGNOSIS — I480 Paroxysmal atrial fibrillation: Secondary | ICD-10-CM | POA: Diagnosis not present

## 2020-09-19 DIAGNOSIS — K862 Cyst of pancreas: Secondary | ICD-10-CM | POA: Diagnosis not present

## 2020-09-19 DIAGNOSIS — N281 Cyst of kidney, acquired: Secondary | ICD-10-CM | POA: Diagnosis not present

## 2020-09-19 DIAGNOSIS — N183 Chronic kidney disease, stage 3 unspecified: Secondary | ICD-10-CM | POA: Diagnosis not present

## 2020-09-19 DIAGNOSIS — I10 Essential (primary) hypertension: Secondary | ICD-10-CM | POA: Diagnosis not present

## 2020-09-19 DIAGNOSIS — I129 Hypertensive chronic kidney disease with stage 1 through stage 4 chronic kidney disease, or unspecified chronic kidney disease: Secondary | ICD-10-CM | POA: Diagnosis not present

## 2020-09-19 DIAGNOSIS — I5032 Chronic diastolic (congestive) heart failure: Secondary | ICD-10-CM | POA: Diagnosis not present

## 2020-09-19 DIAGNOSIS — R600 Localized edema: Secondary | ICD-10-CM | POA: Diagnosis not present

## 2020-09-19 DIAGNOSIS — M85852 Other specified disorders of bone density and structure, left thigh: Secondary | ICD-10-CM | POA: Diagnosis not present

## 2020-09-19 DIAGNOSIS — I4891 Unspecified atrial fibrillation: Secondary | ICD-10-CM | POA: Diagnosis not present

## 2020-09-19 DIAGNOSIS — I130 Hypertensive heart and chronic kidney disease with heart failure and stage 1 through stage 4 chronic kidney disease, or unspecified chronic kidney disease: Secondary | ICD-10-CM | POA: Diagnosis not present

## 2020-09-19 DIAGNOSIS — Z7401 Bed confinement status: Secondary | ICD-10-CM | POA: Diagnosis not present

## 2020-09-19 DIAGNOSIS — R4182 Altered mental status, unspecified: Secondary | ICD-10-CM | POA: Diagnosis not present

## 2020-09-19 DIAGNOSIS — K8500 Idiopathic acute pancreatitis without necrosis or infection: Secondary | ICD-10-CM | POA: Diagnosis not present

## 2020-09-19 DIAGNOSIS — M85851 Other specified disorders of bone density and structure, right thigh: Secondary | ICD-10-CM | POA: Diagnosis not present

## 2020-09-19 DIAGNOSIS — F411 Generalized anxiety disorder: Secondary | ICD-10-CM | POA: Diagnosis not present

## 2020-09-19 LAB — RESP PANEL BY RT-PCR (FLU A&B, COVID) ARPGX2
Influenza A by PCR: NEGATIVE
Influenza B by PCR: NEGATIVE
SARS Coronavirus 2 by RT PCR: NEGATIVE

## 2020-09-19 MED ORDER — POLYETHYLENE GLYCOL 3350 17 G PO PACK: 17.0000 g | PACK | Freq: Every day | ORAL | 0 refills | Status: DC

## 2020-09-19 MED ORDER — PANTOPRAZOLE SODIUM 40 MG PO TBEC
40.0000 mg | DELAYED_RELEASE_TABLET | Freq: Two times a day (BID) | ORAL | Status: DC
  Administered 2020-09-19: 40 mg via ORAL
  Filled 2020-09-19: qty 1

## 2020-09-19 MED ORDER — ALPRAZOLAM 0.5 MG PO TABS: 0.5000 mg | ORAL_TABLET | Freq: Every evening | ORAL | 0 refills | Status: AC | PRN

## 2020-09-19 MED ORDER — CEPHALEXIN 500 MG PO CAPS: 500.0000 mg | ORAL_CAPSULE | Freq: Three times a day (TID) | ORAL | 0 refills | Status: AC

## 2020-09-19 MED ORDER — TRAMADOL HCL 50 MG PO TABS: 50.0000 mg | ORAL_TABLET | Freq: Three times a day (TID) | ORAL | 0 refills | Status: AC | PRN

## 2020-09-19 NOTE — Discharge Summary (Signed)
Physician Discharge Summary  Brenda Matthews MIW:[id number] DOB: [birth year] DOA: 09/14/2020  PCP: [name], MD  Admit date: 09/14/2020 Discharge date: 09/19/2020  Admitted From: Home, independent living Disposition: Skilled nursing facility  Recommendations for Outpatient Follow-up:  Follow up with PCP in 1-2 weeks Please obtain BMP/CBC in one week Gastroenterology will schedule follow-up  Home Health: N/A Equipment/Devices: N/A  Discharge Condition: Stable CODE STATUS: Full code Diet recommendation: Low-salt, low-fat diet  Discharge summary: 85 year old female with history of paroxysmal A. fib on Xarelto, chronic diastolic heart failure, hypertension, hyperlipidemia, degenerative disc disease presented with abdominal pain with nausea.  On presentation, lipase was 1311 with WBCs of 15.4.  CT of the abdomen and pelvis was suggestive of acute pancreatitis with pancreatic pseudocyst, although cystic pancreatic neoplasm was not excluded with dilation of main pancreatic duct.  She was started on IV fluids.  GI was consulted. After some clinical recovery, patient had period of agitation and anxiousness, became impulsive and confused after use of narcotics and Xanax.  Also developed UTI. Stabilized now.    Assessment & plan of care:   Acute pancreatitis with pseudocyst, probably idiopathic pancreatitis. - Triglycerides normal.  No significant history of alcohol use. -GI following.  MRI of abdomen showed acute pancreatitis with pseudocyst in the pancreatic tail.   -Biochemically improving. -Tolerating diet with normal bowel function. -Good bowel movements after use of aggressive bowel regimen.  Continue. -We will prescribe tramadol for short-term use for left upper abdominal pain/pleuritic pain.   Chronic diastolic CHF -Currently stable and compensated.  Resume Lasix 40 mg daily.  Paroxysmal A. fib -Xarelto resumed.  Currently on metoprolol.   Hyperlipidemia -Statin resume on  discharge.   Bilateral hemorrhagic renal cysts -Outpatient follow-up.   Essential hypertension -Continue amlodipine, hydralazine and metoprolol.  Blood pressure stable.   Acute UTI, not present on admission.  Growing more than 100,000 colonies of gram-negative rods. -Received Rocephin 9/2 and 9/3.  Patient is willing to be discharged. -We will discharge on oral Keflex for 5 more days, will call with other antibiotics if culture shows anything different.  Patient is medically stabilizing.  She is able to go to a skilled nursing facility today to continue further physical therapies before returning to independent living.  Discharge Diagnoses:  Principal Problem:   Idiopathic acute pancreatitis without necrosis or infection Active Problems:   ANEMIA   Essential hypertension   PAF (paroxysmal atrial fibrillation) (HCC)   Chronic anticoagulation   Chronic diastolic CHF (congestive heart failure) (HCC)   Pancreatic cyst   Pancreatic duct dilated   Pure hypercholesterolemia    Discharge Instructions  Discharge Instructions     Call MD for:  difficulty breathing, headache or visual disturbances   Complete by: As directed    Call MD for:  persistant nausea and vomiting   Complete by: As directed    Call MD for:  severe uncontrolled pain   Complete by: As directed    Call MD for:  temperature >100.4   Complete by: As directed    Diet - low sodium heart healthy   Complete by: As directed    Increase activity slowly   Complete by: As directed       Allergies as of 09/19/2020       Reactions   Augmentin [amoxicillin-pot Clavulanate] Nausea And Vomiting, Other (See Comments)   "projectile vomiting" Has patient had a PCN reaction causing immediate rash, facial/tongue/throat swelling, SOB or lightheadedness with hypotension:No Has patient had a PCN  reaction causing severe rash involving mucus membranes or skin necrosis:No Has patient had a PCN reaction that required  hospitalization:No Has patient had a PCN reaction occurring within the last 10 years:Yes If all of the above answers are "NO", then may proceed with Cephalosporin use.   Amoxicillin Rash   Clindamycin/lincomycin Rash        Medication List     STOP taking these medications    predniSONE 10 MG tablet Commonly known as: DELTASONE       TAKE these medications    acetaminophen 650 MG CR tablet Commonly known as: TYLENOL Take 650 mg by mouth in the morning and at bedtime.   albuterol 108 (90 Base) MCG/ACT inhaler Commonly known as: VENTOLIN HFA INHALE 2 PUFFS INTO LUNGS EVERY 4 HOURS AS NEEDED FOR WHEEZING OR SHORTNESS OF BREATH What changed: See the new instructions.   ALPRAZolam 0.5 MG tablet Commonly known as: XANAX Take 1 tablet (0.5 mg total) by mouth at bedtime as needed for up to 5 days for sleep or anxiety. What changed: See the new instructions.   amLODipine 5 MG tablet Commonly known as: NORVASC Take 1 tablet (5 mg total) by mouth daily.   atorvastatin 40 MG tablet Commonly known as: LIPITOR Take 1 tablet (40 mg total) by mouth daily.   Biotin 5000 MCG Tabs Take 5,000 mcg by mouth daily.   CALCIUM 600 PO Take 1,200 mg by mouth daily.   carboxymethylcellulose 0.5 % Soln Commonly known as: REFRESH PLUS Place 2 drops into both eyes daily as needed (dry/irritated eyes.).   Centrum Silver Ultra Womens Tabs Take 1 tablet by mouth every evening.   AIRBORNE PO Take 1 tablet by mouth daily as needed (immune support).   cephALEXin 500 MG capsule Commonly known as: KEFLEX Take 1 capsule (500 mg total) by mouth 3 (three) times daily for 5 days.   Coenzyme Q10 200 MG capsule Take 200 mg by mouth daily.   fluticasone 50 MCG/ACT nasal spray Commonly known as: FLONASE Place 2 sprays into both nostrils daily.   furosemide 40 MG tablet Commonly known as: LASIX Take 1 tablet (40 mg total) by mouth daily.   hydrALAZINE 50 MG tablet Commonly known as:  APRESOLINE Take 1 tablet (50 mg total) by mouth 3 (three) times daily.   metoprolol tartrate 100 MG tablet Commonly known as: LOPRESSOR TAKE 1 TABLET BY MOUTH 2 TIMES DAILY   polyethylene glycol 17 g packet Commonly known as: MIRALAX / GLYCOLAX Take 17 g by mouth daily.   potassium chloride SA 20 MEQ tablet Commonly known as: KLOR-CON Take 1 tablet (20 mEq total) by mouth 2 (two) times daily.   PREVAGEN PO Take 1 tablet by mouth daily.   traMADol 50 MG tablet Commonly known as: ULTRAM Take 1 tablet (50 mg total) by mouth every 8 (eight) hours as needed for up to 5 days for moderate pain.   Turmeric 500 MG Caps Take 500 mg by mouth daily.   Xarelto 15 MG Tabs tablet Generic drug: Rivaroxaban TAKE 1 TABLET BY MOUTH EVERY DAY WITH SUPPER What changed: See the new instructions.        Contact information for after-discharge care     Destination     [facility] .   Service: Skilled Nursing Contact information: [place] [phone]                    Allergies  Allergen Reactions   Augmentin [Amoxicillin-Pot Clavulanate] Nausea And Vomiting and Other (See Comments)    "projectile vomiting" Has patient had a PCN reaction causing immediate rash, facial/tongue/throat swelling, SOB or lightheadedness with hypotension:No Has patient had a PCN reaction causing severe rash involving mucus membranes or skin necrosis:No Has patient had a PCN reaction that required hospitalization:No Has patient had a PCN reaction occurring within the last 10 years:Yes If all of the above answers are "NO", then may proceed with Cephalosporin use.    Amoxicillin Rash   Clindamycin/Lincomycin Rash    Consultations: Gastroenterology   Procedures/Studies: CT ABDOMEN PELVIS W CONTRAST  Result Date: 09/14/2020 CLINICAL DATA:  Abdominal pain for 2 days. Bowel obstruction suspected. EXAM: CT ABDOMEN AND PELVIS  WITH CONTRAST TECHNIQUE: Multidetector CT imaging of the abdomen and pelvis was performed using the standard protocol following bolus administration of intravenous contrast. CONTRAST:  114m OMNIPAQUE IOHEXOL 350 MG/ML SOLN COMPARISON:  None. FINDINGS: Lower chest: Subsegmental atelectasis noted in the lung bases. Hepatobiliary: 5.7 cm homogeneous water density lesion in the posterior right liver is compatible with a cyst. 2 cm cyst noted in the anterior hepatic dome. Other scattered smaller hypoattenuating liver lesions are too small to characterize but likely cysts. There is no evidence for gallstones, gallbladder wall thickening, or pericholecystic fluid. No intrahepatic or extrahepatic biliary dilation. Pancreas: Diffuse atrophy of pancreatic parenchyma evident with dilatation of the main pancreatic duct in the head of pancreas. 2.9 x 2.4 cm cystic lesion identified in the tip of the pancreatic tail with surrounding edema/inflammation the tracts into the splenic hilum. Spleen: Trace perisplenic fluid evident. Adrenals/Urinary Tract: No adrenal nodule or mass. 1.9 cm water density lesion in the interpolar right kidney is compatible with a cyst cortical scarring noted in both kidneys. No evidence for hydroureter. The urinary bladder appears normal for the degree of distention. Stomach/Bowel: Stomach is decompressed. Duodenum is normally positioned as is the ligament of Treitz. No small bowel wall thickening. No small bowel dilatation. The terminal ileum is normal. The appendix is not well visualized, but there is no edema or inflammation in the region of the cecum. No gross colonic mass. No colonic wall thickening. Diverticular changes are noted in the left colon without evidence of diverticulitis. Vascular/Lymphatic: There is moderate atherosclerotic calcification of the abdominal aorta without aneurysm. There is no gastrohepatic or hepatoduodenal ligament lymphadenopathy. No retroperitoneal or mesenteric  lymphadenopathy. No pelvic sidewall lymphadenopathy. Reproductive: Unremarkable. Other: No free fluid in the pelvis. Musculoskeletal: No worrisome lytic or sclerotic osseous abnormality. Bilateral pars interarticularis defects noted at L5. Diffuse degenerative disc disease noted lumbar spine. IMPRESSION: 1. 2.9 x 2.4 cm cystic lesion in the tip of the pancreatic tail with surrounding edema/inflammation the tracts into the splenic hilum. Imaging features are most suggestive of acute pancreatitis with pseudocyst. Cystic pancreatic neoplasm is considered less likely but not excluded. MRI of the abdomen without and with contrast recommended to further evaluate. 2. Apparent dilatation of the main pancreatic duct through the head of the pancreas. This could also be further assessed by MRCP. 3. Hepatic and right renal cysts. 4. Left colonic diverticulosis without diverticulitis. 5. Aortic Atherosclerosis (ICD10-I70.0). Electronically Signed   By: EMisty StanleyM.D.   On: 09/14/2020 05:12   DG CHEST PORT 1 VIEW  Result Date: 09/18/2020 CLINICAL DATA:  85year old female with history of paroxysmal atrial fibrillation on Xarelto, history of chronic diastolic heart failure and hypertension. EXAM: PORTABLE CHEST 1 VIEW COMPARISON:  October 13, 2017. FINDINGS: Trachea midline.  Cardiomediastinal contours with persistent cardiac enlargement. Persistent LEFT-sided effusion and basilar airspace disease. Juxta diaphragmatic atelectasis with diminished pleural fluid in the RIGHT chest compared to the prior study. No new area of consolidative changes. No visible pneumothorax. On limited assessment there is no acute skeletal process. IMPRESSION: Persistent LEFT pleural effusion and basilar airspace disease. Juxta diaphragmatic atelectasis on the RIGHT with diminished pleural fluid in the RIGHT chest. Electronically Signed   By: Zetta Bills M.D.   On: 09/18/2020 09:21   MR ABDOMEN MRCP W WO CONTAST  Result Date:  09/14/2020 CLINICAL DATA:  Pancreatitis, pseudocyst on CT EXAM: MRI ABDOMEN WITHOUT AND WITH CONTRAST (INCLUDING MRCP) TECHNIQUE: Multiplanar multisequence MR imaging of the abdomen was performed both before and after the administration of intravenous contrast. Heavily T2-weighted images of the biliary and pancreatic ducts were obtained, and three-dimensional MRCP images were rendered by post processing. CONTRAST:  5m GADAVIST GADOBUTROL 1 MMOL/ML IV SOLN COMPARISON:  CT abdomen/pelvis dated 09/14/2020 FINDINGS: Motion degraded images. Lower chest: Lung bases are clear. Hepatobiliary: Scattered hepatic cysts measuring up to 5.5 cm in the posterior right hepatic lobe (series 6/image 11). Gallbladder is unremarkable. No intrahepatic or extrahepatic ductal dilatation. No choledocholithiasis is seen. Pancreas: Mild peripancreatic inflammatory changes/fluid with associated dilatation of the main pancreatic duct measuring up to 7 mm, likely related to the patient's known acute pancreatitis. No evidence of pancreatic divisum. 3.0 cm unilocular cystic lesion in the pancreatic tail, compatible with a pancreatic pseudocyst, without enhancement following contrast administration. Spleen:  Within normal limits. Adrenals/Urinary Tract:  Adrenal glands are within normal limits. Bilateral hemorrhagic renal cysts (series 17/images 59 and 64), including a 2.1 cm cyst with layering hemorrhage in the lateral right lower kidney, benign (Bosniak II). No hydronephrosis. Stomach/Bowel: Stomach is within normal limits. Visualized bowel is grossly unremarkable. Vascular/Lymphatic:  No evidence of abdominal aortic aneurysm. No suspicious abdominal lymphadenopathy. Other:  No abdominal ascites. Musculoskeletal: No focal osseous lesions. IMPRESSION: Motion degraded images. Acute pancreatitis. Associated 3 cm pseudocyst in the pancreatic tail. Bilateral hemorrhagic renal cysts, benign (Bosniak II). Electronically Signed   By: SJulian Hy M.D.   On: 09/14/2020 22:30   (Echo, Carotid, EGD, Colonoscopy, ERCP)    Subjective: Patient seen and examined.  Had some pain on her left flank last night, used 1 dose of tramadol yesterday.  Denies any chest pain or shortness of breath.  Denies any abdominal pain.  Denies any nausea or vomiting. Had some bowel movement after using MiraLAX and was happy. [facility]   Discharge Exam: Vitals:   09/18/20 2036 09/19/20 0605  BP: 132/66 127/90  Pulse: 85 83  Resp: 17 17  Temp: 99.8 F (37.7 C) 98.2 F (36.8 C)  SpO2: 96% 95%   Vitals:   09/18/20 0641 09/18/20 1422 09/18/20 2036 09/19/20 0605  BP: 109/73 125/83 132/66 127/90  Pulse: 98 90 85 83  Resp: 16 20 17 17   Temp: 99 F (37.2 C) 98.1 F (36.7 C) 99.8 F (37.7 C) 98.2 F (36.8 C)  TempSrc: Oral Oral Oral   SpO2: 98% 96% 96% 95%  Weight:      Height:        General: Pt is alert, awake, not in acute distress Sitting in chair.  On room air. Cardiovascular: RRR, S1/S2 +, no rubs, no gallops Respiratory: CTA bilaterally, no wheezing, no rhonchi, no added sounds. Abdominal: Soft, NT, ND, bowel sounds +, no rigidity or guarding. Extremities: no edema, no  cyanosis    The results of significant diagnostics from this hospitalization (including imaging, microbiology, ancillary and laboratory) are listed below for reference.     Microbiology: Recent Results (from the past 240 hour(s))  Resp Panel by RT-PCR (Flu A&B, Covid) Nasopharyngeal Swab     Status: None   Collection Time: 09/14/20  6:10 AM   Specimen: Nasopharyngeal Swab; Nasopharyngeal(NP) swabs in vial transport medium  Result Value Ref Range Status   SARS Coronavirus 2 by RT PCR NEGATIVE NEGATIVE Final    Comment: (NOTE) SARS-CoV-2 target nucleic acids are NOT DETECTED.  The SARS-CoV-2 RNA is generally detectable in upper respiratory specimens during the acute phase of infection. The lowest concentration of SARS-CoV-2 viral copies  this assay can detect is 138 copies/mL. A negative result does not preclude SARS-Cov-2 infection and should not be used as the sole basis for treatment or other patient management decisions. A negative result may occur with  improper specimen collection/handling, submission of specimen other than nasopharyngeal swab, presence of viral mutation(s) within the areas targeted by this assay, and inadequate number of viral copies(<138 copies/mL). A negative result must be combined with clinical observations, patient history, and epidemiological information. The expected result is Negative.  Fact Sheet for Patients:  EntrepreneurPulse.com.au  Fact Sheet for Healthcare Providers:  IncredibleEmployment.be  This test is no t yet approved or cleared by the [place] FDA and  has been authorized for detection and/or diagnosis of SARS-CoV-2 by FDA under an Emergency Use Authorization (EUA). This EUA will remain  in effect (meaning this test can be used) for the duration of the COVID-19 declaration under Section 564(b)(1) of the Act, 21 U.S.C.section 360bbb-3(b)(1), unless the authorization is terminated  or revoked sooner.       Influenza A by PCR NEGATIVE NEGATIVE Final   Influenza B by PCR NEGATIVE NEGATIVE Final    Comment: (NOTE) The Xpert Xpress SARS-CoV-2/FLU/RSV plus assay is intended as an aid in the diagnosis of influenza from Nasopharyngeal swab specimens and should not be used as a sole basis for treatment. Nasal washings and aspirates are unacceptable for Xpert Xpress SARS-CoV-2/FLU/RSV testing.  Fact Sheet for Patients: EntrepreneurPulse.com.au  Fact Sheet for Healthcare Providers: IncredibleEmployment.be  This test is not yet approved or cleared by the [place] FDA and has been authorized for detection and/or diagnosis of SARS-CoV-2 by FDA under an Emergency Use Authorization (EUA). This EUA  will remain in effect (meaning this test can be used) for the duration of the COVID-19 declaration under Section 564(b)(1) of the Act, 21 U.S.C. section 360bbb-3(b)(1), unless the authorization is terminated or revoked.  Performed at [facility], [address] [place], [place] [address]   Urine Culture     Status: Abnormal (Preliminary result)   Collection Time: 09/17/20  6:41 PM   Specimen: Urine, Clean Catch  Result Value Ref Range Status   Specimen Description   Final    URINE, CLEAN CATCH Performed at [facility], [place] [address] [place], [place] [address]    Special Requests   Final    NONE Performed at [facility], [place] [address], [place], [place] [address]    Culture (A)  Final    >=100,000 COLONIES/mL GRAM NEGATIVE RODS CULTURE REINCUBATED FOR BETTER GROWTH SUSCEPTIBILITIES TO FOLLOW Performed at [facility], [place] [address], [place], [place] [address]    Report Status PENDING  Incomplete     Labs: BNP (last 3 results) No results  for input(s): BNP in the last 8760 hours. Basic Metabolic Panel: Recent Labs  Lab 09/14/20 0316 09/15/20 0430 09/16/20 0434 09/18/20 0426  NA 137 140 136 133*  K 3.9 4.1 4.6 4.3  CL 102 109 106 103  CO2 26 24 24 24   GLUCOSE 114* 80 108* 104*  BUN 15 19 18 19   CREATININE 0.99 1.01* 0.92 0.91  CALCIUM 9.0 8.4* 8.4* 8.6*  MG  --   --  2.0 2.0  PHOS  --   --   --  2.3*   Liver Function Tests: Recent Labs  Lab 09/14/20 0316 09/15/20 0430 09/16/20 0434  AST 19 12* 15  ALT 15 11 11   ALKPHOS 81 70 78  BILITOT 1.3* 1.3* 1.0  PROT 6.9 5.3* 5.6*  ALBUMIN 3.5 2.7* 2.6*   Recent Labs  Lab 09/14/20 0446 09/16/20 0434  LIPASE 1,311* 47   No results for input(s): AMMONIA in the last 168 hours. CBC: Recent Labs  Lab 09/14/20 0316 09/15/20 0430 09/16/20 0434  WBC 15.4* 13.5* 11.6*  NEUTROABS 12.8*  --  10.0*  HGB 13.6 10.2* 10.2*  HCT 42.6 32.6* 32.5*  MCV 92.4  93.9 93.9  PLT 207 160 170   Cardiac Enzymes: No results for input(s): CKTOTAL, CKMB, CKMBINDEX, TROPONINI in the last 168 hours. BNP: Invalid input(s): POCBNP CBG: No results for input(s): GLUCAP in the last 168 hours. D-Dimer No results for input(s): DDIMER in the last 72 hours. Hgb A1c No results for input(s): HGBA1C in the last 72 hours. Lipid Profile No results for input(s): CHOL, HDL, LDLCALC, TRIG, CHOLHDL, LDLDIRECT in the last 72 hours. Thyroid function studies No results for input(s): TSH, T4TOTAL, T3FREE, THYROIDAB in the last 72 hours.  Invalid input(s): FREET3 Anemia work up No results for input(s): VITAMINB12, FOLATE, FERRITIN, TIBC, IRON, RETICCTPCT in the last 72 hours. Urinalysis    Component Value Date/Time   COLORURINE AMBER (A) 09/17/2020 2300   APPEARANCEUR CLOUDY (A) 09/17/2020 2300   LABSPEC 1.010 09/17/2020 2300   PHURINE 6.0 09/17/2020 2300   GLUCOSEU NEGATIVE 09/17/2020 2300   HGBUR NEGATIVE 09/17/2020 2300   HGBUR negative 12/10/2008 0000   BILIRUBINUR SMALL (A) 09/17/2020 2300   BILIRUBINUR negative 08/21/2014 1204   KETONESUR NEGATIVE 09/17/2020 2300   PROTEINUR 30 (A) 09/17/2020 2300   UROBILINOGEN 0.2 08/21/2014 1204   UROBILINOGEN 0.2 12/10/2008 0000   NITRITE NEGATIVE 09/17/2020 2300   LEUKOCYTESUR MODERATE (A) 09/17/2020 2300   Sepsis Labs Invalid input(s): PROCALCITONIN,  WBC,  LACTICIDVEN Microbiology Recent Results (from the past 240 hour(s))  Resp Panel by RT-PCR (Flu A&B, Covid) Nasopharyngeal Swab     Status: None   Collection Time: 09/14/20  6:10 AM   Specimen: Nasopharyngeal Swab; Nasopharyngeal(NP) swabs in vial transport medium  Result Value Ref Range Status   SARS Coronavirus 2 by RT PCR NEGATIVE NEGATIVE Final    Comment: (NOTE) SARS-CoV-2 target nucleic acids are NOT DETECTED.  The SARS-CoV-2 RNA is generally detectable in upper respiratory specimens during the acute phase of infection. The lowest concentration of  SARS-CoV-2 viral copies this assay can detect is 138 copies/mL. A negative result does not preclude SARS-Cov-2 infection and should not be used as the sole basis for treatment or other patient management decisions. A negative result may occur with  improper specimen collection/handling, submission of specimen other than nasopharyngeal swab, presence of viral mutation(s) within the areas targeted by this assay, and inadequate number of viral copies(<138 copies/mL). A negative result must be combined  with clinical observations, patient history, and epidemiological information. The expected result is Negative.  Fact Sheet for Patients:  EntrepreneurPulse.com.au  Fact Sheet for Healthcare Providers:  IncredibleEmployment.be  This test is no t yet approved or cleared by the [place] FDA and  has been authorized for detection and/or diagnosis of SARS-CoV-2 by FDA under an Emergency Use Authorization (EUA). This EUA will remain  in effect (meaning this test can be used) for the duration of the COVID-19 declaration under Section 564(b)(1) of the Act, 21 U.S.C.section 360bbb-3(b)(1), unless the authorization is terminated  or revoked sooner.       Influenza A by PCR NEGATIVE NEGATIVE Final   Influenza B by PCR NEGATIVE NEGATIVE Final    Comment: (NOTE) The Xpert Xpress SARS-CoV-2/FLU/RSV plus assay is intended as an aid in the diagnosis of influenza from Nasopharyngeal swab specimens and should not be used as a sole basis for treatment. Nasal washings and aspirates are unacceptable for Xpert Xpress SARS-CoV-2/FLU/RSV testing.  Fact Sheet for Patients: EntrepreneurPulse.com.au  Fact Sheet for Healthcare Providers: IncredibleEmployment.be  This test is not yet approved or cleared by the [place] FDA and has been authorized for detection and/or diagnosis of SARS-CoV-2 by FDA under an Emergency Use  Authorization (EUA). This EUA will remain in effect (meaning this test can be used) for the duration of the COVID-19 declaration under Section 564(b)(1) of the Act, 21 U.S.C. section 360bbb-3(b)(1), unless the authorization is terminated or revoked.  Performed at [facility], [address] [place], [place] [address]   Urine Culture     Status: Abnormal (Preliminary result)   Collection Time: 09/17/20  6:41 PM   Specimen: Urine, Clean Catch  Result Value Ref Range Status   Specimen Description   Final    URINE, CLEAN CATCH Performed at [facility], [place] [address], [place], [place] [address]    Special Requests   Final    NONE Performed at [facility], [place] [address] [place], [place] [address]    Culture (A)  Final    >=100,000 COLONIES/mL GRAM NEGATIVE RODS CULTURE REINCUBATED FOR BETTER GROWTH SUSCEPTIBILITIES TO FOLLOW Performed at [facility], [place] [address], [place], [place] [address]    Report Status PENDING  Incomplete     Time coordinating discharge:  35 minutes  SIGNED:   Barb Merino, MD  [facility] 09/19/2020, 9:36 AM

## 2020-09-19 NOTE — Plan of Care (Signed)

## 2020-09-19 NOTE — Progress Notes (Signed)
Report given to Lia Hopping, nurse, at [facility].

## 2020-09-19 NOTE — Progress Notes (Signed)
[facility] Progress Note  Chief Complaint: Acute pancreatitis  History:  Brenda Matthews feels better today, and was seen along with the hospitalist physician.  Her abdominal pain has decreased, she is tolerating a low-fat diet and now moving her bowels well.  Her mental status has cleared and she is off oxygen.  ROS: Cardiovascular: Denies chest pain Respiratory: Dyspnea is improved, no cough Urinary: Denies dysuria  Objective:   Current Facility-Administered Medications:    acetaminophen (TYLENOL) tablet 650 mg, 650 mg, Oral, Q6H PRN, 650 mg at 09/18/20 0719 **OR** acetaminophen (TYLENOL) suppository 650 mg, 650 mg, Rectal, Q6H PRN, Tacey Ruiz, MD   albuterol (PROVENTIL) (2.5 MG/3ML) 0.083% nebulizer solution 2.5 mg, 2.5 mg, Nebulization, Q4H PRN, Tacey Ruiz, MD, 2.5 mg at 09/18/20 0543   ALPRAZolam (XANAX) tablet 0.25 mg, 0.25 mg, Oral, QHS PRN, Barb Merino, MD   amLODipine (NORVASC) tablet 5 mg, 5 mg, Oral, Daily, Tacey Ruiz, MD, 5 mg at 09/16/20 3419   bisacodyl (DULCOLAX) EC tablet 5 mg, 5 mg, Oral, Daily PRN, Tacey Ruiz, MD, 5 mg at 09/14/20 1312   cefTRIAXone (ROCEPHIN) 1 g in sodium chloride 0.9 % 100 mL IVPB, 1 g, Intravenous, Q24H, Ghimire, Kuber, MD, Last Rate: 200 mL/hr at 09/19/20 0651, 1 g at 09/19/20 0651   fluticasone (FLONASE) 50 MCG/ACT nasal spray 2 spray, 2 spray, Each Nare, Daily, Tacey Ruiz, MD, 2 spray at 09/18/20 0819   furosemide (LASIX) tablet 40 mg, 40 mg, Oral, Daily, Ghimire, Kuber, MD   hydrALAZINE (APRESOLINE) tablet 50 mg, 50 mg, Oral, TID, Tacey Ruiz, MD, 50 mg at 09/18/20 2155   metoprolol tartrate (LOPRESSOR) injection 5 mg, 5 mg, Intravenous, Q6H PRN, Tacey Ruiz, MD   metoprolol tartrate (LOPRESSOR) tablet 100 mg, 100 mg, Oral, BID, Tacey Ruiz, MD, 100 mg at 09/18/20 2155   morphine 2 MG/ML injection 1 mg, 1 mg, Intravenous, Q2H PRN, Barb Merino, MD, 1 mg at 09/18/20 2156   ondansetron (ZOFRAN) tablet 4 mg, 4 mg,  Oral, Q6H PRN **OR** ondansetron (ZOFRAN) injection 4 mg, 4 mg, Intravenous, Q6H PRN, Tacey Ruiz, MD   pantoprazole (PROTONIX) EC tablet 40 mg, 40 mg, Oral, BID, Bell, Michelle T, RPH   polyethylene glycol (MIRALAX / GLYCOLAX) packet 17 g, 17 g, Oral, BID, Willia Craze, NP, 17 g at 09/18/20 2156   polyvinyl alcohol (LIQUIFILM TEARS) 1.4 % ophthalmic solution 2 drop, 2 drop, Both Eyes, Daily PRN, Tacey Ruiz, MD   Rivaroxaban (XARELTO) tablet 15 mg, 15 mg, Oral, Q supper, Barb Merino, MD, 15 mg at 09/18/20 1608   senna-docusate (Senokot-S) tablet 1 tablet, 1 tablet, Oral, QHS PRN, Tacey Ruiz, MD   traMADol (ULTRAM) tablet 50 mg, 50 mg, Oral, Q6H PRN, Barb Merino, MD, 50 mg at 09/18/20 1609   cefTRIAXone (ROCEPHIN)  IV 1 g (09/19/20 0651)     Vital signs in last 24 hrs: Vitals:   09/18/20 2036 09/19/20 0605  BP: 132/66 127/90  Pulse: 85 83  Resp: 17 17  Temp: 99.8 F (37.7 C) 98.2 F (36.8 C)  SpO2: 96% 95%    Intake/Output Summary (Last 24 hours) at 09/19/2020 0841 Last data filed at 09/19/2020 0600 Gross per 24 hour  Intake 220 ml  Output 1251 ml  Net -1031 ml     Physical Exam Sitting comfortably in a chair, breathing comfortably on room air and alert and conversational HEENT: sclera anicteric, oral mucosa without lesions Neck: supple, no thyromegaly, JVD or lymphadenopathy Cardiac: RRR without murmurs, S1S2 heard,  no peripheral edema Pulm: clear to auscultation bilaterally, normal RR and effort noted Abdomen: soft, mild epigastric to left upper quadrant tenderness, with active bowel sounds. No guarding or palpable hepatosplenomegaly Skin; warm and dry, no jaundice  Recent Labs:  CBC Latest Ref Rng & Units 09/16/2020 09/15/2020 09/14/2020  WBC 4.0 - 10.5 K/uL 11.6(H) 13.5(H) 15.4(H)  Hemoglobin 12.0 - 15.0 g/dL 10.2(L) 10.2(L) 13.6  Hematocrit 36.0 - 46.0 % 32.5(L) 32.6(L) 42.6  Platelets 150 - 400 K/uL 170 160 207    Recent Labs  Lab  09/15/20 0430  INR 1.5*   CMP Latest Ref Rng & Units 09/18/2020 09/16/2020 09/15/2020  Glucose 70 - 99 mg/dL 104(H) 108(H) 80  BUN 8 - 23 mg/dL 19 18 19   Creatinine 0.44 - 1.00 mg/dL 0.91 0.92 1.01(H)  Sodium 135 - 145 mmol/L 133(L) 136 140  Potassium 3.5 - 5.1 mmol/L 4.3 4.6 4.1  Chloride 98 - 111 mmol/L 103 106 109  CO2 22 - 32 mmol/L 24 24 24   Calcium 8.9 - 10.3 mg/dL 8.6(L) 8.4(L) 8.4(L)  Total Protein 6.5 - 8.1 g/dL - 5.6(L) 5.3(L)  Total Bilirubin 0.3 - 1.2 mg/dL - 1.0 1.3(H)  Alkaline Phos 38 - 126 U/L - 78 70  AST 15 - 41 U/L - 15 12(L)  ALT 0 - 44 U/L - 11 11   Today's labs not yet result  Radiologic studies:   Assessment & Plan  Assessment:  Acute pancreatitis of unknown cause.  There was initial suggestion of maybe one or more of her medicines, but this seems unlikely with chronic stable nature of her medication regimen.  Epigastric to left upper quadrant pain.  Her pain is more toward the left because the inflammation was in the tail of the pancreas adjacent to the splenic hilum causing a visible inflammatory reaction seen on CT scan.  There was a small cyst felt more consistent with an evolving pseudocyst, less likely neoplasm on MRI.  Acute ileus from a combination of acute pancreatitis, immobility, opioid analgesics, now resolved.   Plan: In my opinion, she is stable for discharge today from a GI standpoint. Low-fat diet indefinitely She does not need any acid suppression medication Judicious use of pain medication. She is going to the rehab side of her independent living facility to improve her strength and mobility after this illness. I will communicate with Dr. Henrene Pastor so he can coordinate clinic follow-up with him at the appropriate interval.  She will need eventual repeat imaging, timing TBD at that follow-up.  Discussed plan with hospitalist at the bedside.  Nelida Meuse III Office: [phone]

## 2020-09-19 NOTE — Progress Notes (Signed)

## 2020-09-19 NOTE — TOC Transition Note (Signed)
Transition of Care [facility]) - CM/SW Discharge Note   Patient Details  Name: AVERILL WINTERS MRN: [id number] Date of Birth: 1935-08-23  Transition of Care [facility]) CM/SW Contact:  Lennart Pall, LCSW Phone Number: 09/19/2020, 2:03 PM   Clinical Narrative:    Alerted by MD that pt is medically clear for SNF dc and pt insistent on dc today.  Had been told by [facility] that they cannot admit over the weekend.  Pt insisted I work to make it happen.  Spoke with Lenna Sciara, Soil scientist at [employer] this morning and she OK'd pt's return today.  All records faxed; received insurance auth; PTAR called and RN to call report to [phone].  No further TOC needs.   Final next level of care: [place] Barriers to Discharge: Barriers Resolved   Patient Goals and CMS Choice Patient states their goals for this hospitalization and ongoing recovery are:: [place] following SNF rehab      Discharge Placement              Patient chooses bed at: [facility] Patient to be transferred to facility by: [facility] In-house Referral: Clinical Social Work   Post Acute Care Choice: [place]                               Social Determinants of Health (SDOH) Interventions     Readmission Risk Interventions No flowsheet data found.

## 2020-09-20 ENCOUNTER — Encounter: Payer: Self-pay | Admitting: Family Medicine

## 2020-09-20 LAB — URINE CULTURE: Culture: >=100000 — AB

## 2020-09-22 ENCOUNTER — Telehealth: Payer: Self-pay

## 2020-09-22 DIAGNOSIS — K859 Acute pancreatitis without necrosis or infection, unspecified: Secondary | ICD-10-CM | POA: Diagnosis not present

## 2020-09-22 DIAGNOSIS — F411 Generalized anxiety disorder: Secondary | ICD-10-CM | POA: Diagnosis not present

## 2020-09-22 DIAGNOSIS — N182 Chronic kidney disease, stage 2 (mild): Secondary | ICD-10-CM | POA: Diagnosis not present

## 2020-09-22 DIAGNOSIS — E785 Hyperlipidemia, unspecified: Secondary | ICD-10-CM | POA: Diagnosis not present

## 2020-09-22 DIAGNOSIS — I4892 Unspecified atrial flutter: Secondary | ICD-10-CM | POA: Diagnosis not present

## 2020-09-22 DIAGNOSIS — I4891 Unspecified atrial fibrillation: Secondary | ICD-10-CM | POA: Diagnosis not present

## 2020-09-22 DIAGNOSIS — I129 Hypertensive chronic kidney disease with stage 1 through stage 4 chronic kidney disease, or unspecified chronic kidney disease: Secondary | ICD-10-CM | POA: Diagnosis not present

## 2020-09-22 NOTE — Telephone Encounter (Signed)
Irene Shipper, MD  Loletha Carrow Kirke Corin, MD; Algernon Huxley, RN; Willia Craze, NP Thanks Mallie Mussel.   Vaughan Basta, arrange follow-up with Nevin Bloodgood, 2-4 weeks, who saw her in the hospital.   Thanks,   JP        The pt has already been scheduled to to see Dr. Henrene Pastor on 10/12

## 2020-09-24 ENCOUNTER — Encounter: Payer: Self-pay | Admitting: Student

## 2020-09-24 ENCOUNTER — Other Ambulatory Visit: Payer: Self-pay

## 2020-09-24 ENCOUNTER — Ambulatory Visit: Payer: Medicare PPO | Admitting: Student

## 2020-09-24 VITALS — BP 130/70 | HR 73 | Ht 61.0 in | Wt 173.0 lb

## 2020-09-24 DIAGNOSIS — E785 Hyperlipidemia, unspecified: Secondary | ICD-10-CM | POA: Diagnosis not present

## 2020-09-24 DIAGNOSIS — D649 Anemia, unspecified: Secondary | ICD-10-CM | POA: Diagnosis not present

## 2020-09-24 DIAGNOSIS — I5032 Chronic diastolic (congestive) heart failure: Secondary | ICD-10-CM

## 2020-09-24 DIAGNOSIS — I480 Paroxysmal atrial fibrillation: Secondary | ICD-10-CM | POA: Diagnosis not present

## 2020-09-24 DIAGNOSIS — I10 Essential (primary) hypertension: Secondary | ICD-10-CM | POA: Diagnosis not present

## 2020-09-24 DIAGNOSIS — R600 Localized edema: Secondary | ICD-10-CM | POA: Diagnosis not present

## 2020-09-24 DIAGNOSIS — N183 Chronic kidney disease, stage 3 unspecified: Secondary | ICD-10-CM | POA: Diagnosis not present

## 2020-09-24 NOTE — Patient Instructions (Addendum)
Medication Instructions:   FOR 3 DAYS ONLY TAKE LASIX 40 MG TWICE A DAY THEN RESUME BACK TO 40 MG ONCE  A DAY   *If you need a refill on your cardiac medications before your next appointment, please call your pharmacy*   Lab Work: CMET CBC AND BNP TODAY   If you have labs (blood work) drawn today and your tests are completely normal, you will receive your results only by: [place] (if you have MyChart) OR A paper copy in the mail If you have any lab test that is abnormal or we need to change your treatment, we will call you to review the results.   Testing/Procedures:NONE ORDERED  TODAY   Follow-Up: At [facility], you and your health needs are our priority.  As part of our continuing mission to provide you with exceptional heart care, we have created designated Provider Care Teams.  These Care Teams include your primary Cardiologist (physician) and Advanced Practice Providers (APPs -  Physician Assistants and Nurse Practitioners) who all work together to provide you with the care you need, when you need it.  We recommend signing up for the patient portal called "MyChart".  Sign up information is provided on this After Visit Summary.  MyChart is used to connect with patients for Virtual Visits (Telemedicine).  Patients are able to view lab/test results, encounter notes, upcoming appointments, etc.  Non-urgent messages can be sent to your provider as well.   To learn more about what you can do with MyChart, go to [email or web address].    Your next appointment:   4 month(s) January   The format for your next appointment:   In Person  Provider:   You may see [name], MD or one of the following Advanced Practice Providers on your designated Care Team:   Sande Rives, PA-C   Other Instructions   [place] OF METOPROLOL IN REHAB WHICH IS TWICE A DAY NOT ONCE A DAY   Heart Failure, Self-Care Heart failure is a serious  condition. The following information explains things you need to do to take care of yourself at home. To help you stay as healthy as possible, you may be asked to change your diet, take certain medicines, and make other changes in your life. Your doctor may also give you more specific instructions. If you have problems or questions, call your doctor. What are the risks? Having heart failure makes it more likely for you to have some problems. These problems can get worse if you do not take good care of yourself. Problems may include: Damage to the kidneys, liver, or lungs. Malnutrition. Abnormal heart rhythms. Blood clotting problems that could cause a stroke. Supplies needed: Scale for weighing yourself. Blood pressure monitor. Notebook. Medicines. How to care for yourself when you have heart failure Medicines Take over-the-counter and prescription medicines only as told by your doctor. Take your medicines every day. Do not stop taking your medicine unless your doctor tells you to do so. Do not skip any medicines. Get your prescriptions refilled before you run out of medicine. This is important. Talk with your doctor if you cannot afford your medicines. Eating and drinking  Eat heart-healthy foods. Talk with a diet specialist (dietitian) to create an eating plan. Limit salt (sodium) if told by your doctor. Ask your diet specialist to tell you which seasonings are healthy for your heart. [place] in healthy ways instead of frying. Healthy ways of  cooking include roasting, grilling, broiling, baking, poaching, steaming, and stir-frying. Choose foods that: Have no trans fat. Are low in saturated fat and cholesterol. Choose healthy foods, such as: Fresh or frozen fruits and vegetables. Fish. Low-fat (lean) meats. Legumes, such as beans, peas, and lentils. Fat-free or low-fat dairy products. Whole-grain foods. High-fiber foods. Limit how much fluid you drink, if told by your  doctor. Alcohol use Do not drink alcohol if: Your doctor tells you not to drink. Your heart was damaged by alcohol, or you have very bad heart failure. You are pregnant, may be pregnant, or are planning to become pregnant. If you drink alcohol: Limit how much you have to: 0-1 drink a day for women. 0-2 drinks a day for men. Know how much alcohol is in your drink. In the [place] one drink equals one 12 oz bottle of beer (355 mL), one 5 oz glass of wine (148 mL), or one 1 oz glass of hard liquor (44 mL). Lifestyle  Do not smoke or use any products that contain nicotine or tobacco. If you need help quitting, ask your doctor. Do not use nicotine gum or patches before talking to your doctor. Do not use illegal drugs. Lose weight if told by your doctor. Do physical activity if told by your doctor. Talk to your doctor before you begin an exercise if: You are an older adult. You have very bad heart failure. Learn to manage stress. If you need help, ask your doctor. Get physical rehab (rehabilitation) to help you stay independent and to help with your quality of life. Participate in a cardiac rehab program. This program helps you improve your health through exercise, education, and counseling. Plan time to rest when you get tired. Check weight and blood pressure  Weigh yourself every day. This will help you to know if fluid is building up in your body. Weigh yourself every morning after you pee (urinate) and before you eat breakfast. Wear the same amount of clothing each time. Write down your daily weight. Give your record to your doctor. Check and write down your blood pressure as told by your doctor. Check your pulse as told by your doctor. Dealing with very hot and very cold weather If it is very hot: Avoid activities that take a lot of energy. Use air conditioning or fans, or find a cooler place. Avoid caffeine and alcohol. Wear clothing that is loose-fitting, lightweight, and  light-colored. If it is very cold: Avoid activities that take a lot of energy. Layer your clothes. Wear mittens or gloves, a hat, and a face covering when you go outside. Avoid alcohol. Follow these instructions at home: Stay up to date with shots (vaccines). Get pneumococcal and flu (influenza) shots. Keep all follow-up visits. Contact a doctor if: You gain 2-3 lb (1-1.4 kg) in 24 hours or 5 lb (2.3 kg) in a week. You have increasing shortness of breath. You cannot do your normal activities. You get tired easily. You cough a lot. You do not feel like eating or feel like you may vomit (nauseous). You have swelling in your hands, feet, ankles, or belly (abdomen). You cannot sleep well because it is hard to breathe. You feel like your heart is beating fast (palpitations). You get dizzy when you stand up. You feel depressed or sad. Get help right away if: You have trouble breathing. You or someone else notices a change in your behavior, such as having trouble staying awake. You have chest pain or discomfort. You pass  out (faint). These symptoms may be an emergency. Get help right away. Call your local emergency services (911 in the [place] Do not wait to see if the symptoms will go away. Do not drive yourself to the hospital. Summary Heart failure is a serious condition. To care for yourself, you may have to change your diet, take medicines, and make other lifestyle changes. Take your medicines every day. Do not stop taking them unless your doctor tells you to do so. Limit salt and eat heart-healthy foods. Ask your doctor if you can drink alcohol. You may have to stop alcohol use if you have very bad heart failure. Contact your doctor if you gain weight quickly or feel that your heart is beating too fast. Get help right away if you pass out or have chest pain or trouble breathing. This information is not intended to replace advice given to you by your health care provider. Make sure  you discuss any questions you have with your health care provider. Document Revised: 07/27/2019 Document Reviewed: 07/27/2019 Elsevier Patient Education  [place].

## 2020-09-25 ENCOUNTER — Other Ambulatory Visit: Payer: Self-pay

## 2020-09-25 LAB — CBC
Hematocrit: 36.4 % (ref 34.0–46.6)
Hemoglobin: 11.7 g/dL (ref 11.1–15.9)
MCH: 29 pg (ref 26.6–33.0)
MCHC: 32.1 g/dL (ref 31.5–35.7)
MCV: 90 fL (ref 79–97)
Platelets: 460 10*3/uL — ABNORMAL HIGH (ref 150–450)
RBC: 4.03 x10E6/uL (ref 3.77–5.28)
RDW: 13.8 % (ref 11.7–15.4)
WBC: 8.2 10*3/uL (ref 3.4–10.8)

## 2020-09-25 LAB — COMPREHENSIVE METABOLIC PANEL
ALT: 23 IU/L (ref 0–32)
AST: 23 IU/L (ref 0–40)
Albumin/Globulin Ratio: 1.5 (ref 1.2–2.2)
Albumin: 3.5 g/dL — ABNORMAL LOW (ref 3.6–4.6)
Alkaline Phosphatase: 134 IU/L — ABNORMAL HIGH (ref 44–121)
BUN/Creatinine Ratio: 15 (ref 12–28)
BUN: 13 mg/dL (ref 8–27)
Bilirubin Total: 0.3 mg/dL (ref 0.0–1.2)
CO2: 23 mmol/L (ref 20–29)
Calcium: 9.3 mg/dL (ref 8.7–10.3)
Chloride: 104 mmol/L (ref 96–106)
Creatinine, Ser: 0.86 mg/dL (ref 0.57–1.00)
Globulin, Total: 2.4 g/dL (ref 1.5–4.5)
Glucose: 113 mg/dL — ABNORMAL HIGH (ref 65–99)
Potassium: 4.6 mmol/L (ref 3.5–5.2)
Sodium: 140 mmol/L (ref 134–144)
Total Protein: 5.9 g/dL — ABNORMAL LOW (ref 6.0–8.5)
eGFR: 67 mL/min/{1.73_m2} (ref >59)

## 2020-09-25 LAB — PRO B NATRIURETIC PEPTIDE: NT-Pro BNP: 2759 pg/mL — ABNORMAL HIGH (ref 0–738)

## 2020-09-25 MED ORDER — RIVAROXABAN 20 MG PO TABS: 20.0000 mg | ORAL_TABLET | Freq: Every day | ORAL | Status: DC

## 2020-10-01 DIAGNOSIS — M85852 Other specified disorders of bone density and structure, left thigh: Secondary | ICD-10-CM | POA: Diagnosis not present

## 2020-10-01 DIAGNOSIS — M85851 Other specified disorders of bone density and structure, right thigh: Secondary | ICD-10-CM | POA: Diagnosis not present

## 2020-10-01 DIAGNOSIS — Z780 Asymptomatic menopausal state: Secondary | ICD-10-CM | POA: Diagnosis not present

## 2020-10-01 DIAGNOSIS — Z1231 Encounter for screening mammogram for malignant neoplasm of breast: Secondary | ICD-10-CM | POA: Diagnosis not present

## 2020-10-01 LAB — HM MAMMOGRAPHY

## 2020-10-01 LAB — HM DEXA SCAN

## 2020-10-02 ENCOUNTER — Telehealth: Payer: Self-pay

## 2020-10-02 DIAGNOSIS — R2681 Unsteadiness on feet: Secondary | ICD-10-CM | POA: Diagnosis not present

## 2020-10-02 DIAGNOSIS — R2689 Other abnormalities of gait and mobility: Secondary | ICD-10-CM | POA: Diagnosis not present

## 2020-10-02 DIAGNOSIS — M6281 Muscle weakness (generalized): Secondary | ICD-10-CM | POA: Diagnosis not present

## 2020-10-02 DIAGNOSIS — R278 Other lack of coordination: Secondary | ICD-10-CM | POA: Diagnosis not present

## 2020-10-02 DIAGNOSIS — K8500 Idiopathic acute pancreatitis without necrosis or infection: Secondary | ICD-10-CM | POA: Diagnosis not present

## 2020-10-02 NOTE — Telephone Encounter (Addendum)
Received mammogram and bone density results from [facility] on 10/02/20. HM updated, will place on PCP desk for review.

## 2020-10-05 DIAGNOSIS — K8500 Idiopathic acute pancreatitis without necrosis or infection: Secondary | ICD-10-CM | POA: Diagnosis not present

## 2020-10-05 DIAGNOSIS — R2681 Unsteadiness on feet: Secondary | ICD-10-CM | POA: Diagnosis not present

## 2020-10-05 DIAGNOSIS — R2689 Other abnormalities of gait and mobility: Secondary | ICD-10-CM | POA: Diagnosis not present

## 2020-10-05 DIAGNOSIS — M6281 Muscle weakness (generalized): Secondary | ICD-10-CM | POA: Diagnosis not present

## 2020-10-05 DIAGNOSIS — R278 Other lack of coordination: Secondary | ICD-10-CM | POA: Diagnosis not present

## 2020-10-07 ENCOUNTER — Telehealth: Payer: Self-pay | Admitting: Family Medicine

## 2020-10-07 ENCOUNTER — Encounter: Payer: Self-pay | Admitting: Family Medicine

## 2020-10-07 DIAGNOSIS — R2689 Other abnormalities of gait and mobility: Secondary | ICD-10-CM | POA: Diagnosis not present

## 2020-10-07 DIAGNOSIS — K8500 Idiopathic acute pancreatitis without necrosis or infection: Secondary | ICD-10-CM | POA: Diagnosis not present

## 2020-10-07 DIAGNOSIS — M6281 Muscle weakness (generalized): Secondary | ICD-10-CM | POA: Diagnosis not present

## 2020-10-07 DIAGNOSIS — R2681 Unsteadiness on feet: Secondary | ICD-10-CM | POA: Diagnosis not present

## 2020-10-07 DIAGNOSIS — R278 Other lack of coordination: Secondary | ICD-10-CM | POA: Diagnosis not present

## 2020-10-07 NOTE — Telephone Encounter (Signed)
Spoke with pt regarding labs and instructions.   

## 2020-10-07 NOTE — Telephone Encounter (Signed)
Pls notify pt that her bone density test showed slight improvement in her bones.  No changes recommended. Plan rpt bone density testing in 2 yrs.

## 2020-10-07 NOTE — Telephone Encounter (Signed)
LVM for pt to CB regarding results.  

## 2020-10-08 DIAGNOSIS — M6281 Muscle weakness (generalized): Secondary | ICD-10-CM | POA: Diagnosis not present

## 2020-10-08 DIAGNOSIS — R278 Other lack of coordination: Secondary | ICD-10-CM | POA: Diagnosis not present

## 2020-10-08 DIAGNOSIS — R2681 Unsteadiness on feet: Secondary | ICD-10-CM | POA: Diagnosis not present

## 2020-10-08 DIAGNOSIS — K8500 Idiopathic acute pancreatitis without necrosis or infection: Secondary | ICD-10-CM | POA: Diagnosis not present

## 2020-10-08 DIAGNOSIS — R2689 Other abnormalities of gait and mobility: Secondary | ICD-10-CM | POA: Diagnosis not present

## 2020-10-09 DIAGNOSIS — M6281 Muscle weakness (generalized): Secondary | ICD-10-CM | POA: Diagnosis not present

## 2020-10-09 DIAGNOSIS — K8500 Idiopathic acute pancreatitis without necrosis or infection: Secondary | ICD-10-CM | POA: Diagnosis not present

## 2020-10-09 DIAGNOSIS — R2681 Unsteadiness on feet: Secondary | ICD-10-CM | POA: Diagnosis not present

## 2020-10-09 DIAGNOSIS — R2689 Other abnormalities of gait and mobility: Secondary | ICD-10-CM | POA: Diagnosis not present

## 2020-10-09 DIAGNOSIS — R278 Other lack of coordination: Secondary | ICD-10-CM | POA: Diagnosis not present

## 2020-10-11 ENCOUNTER — Ambulatory Visit (INDEPENDENT_AMBULATORY_CARE_PROVIDER_SITE_OTHER): Payer: Medicare PPO

## 2020-10-11 DIAGNOSIS — Z0000 Encounter for general adult medical examination without abnormal findings: Secondary | ICD-10-CM | POA: Diagnosis not present

## 2020-10-11 NOTE — Progress Notes (Addendum)
Subjective:   Brenda Matthews is a 85 y.o. female who presents for Medicare Annual (Subsequent) preventive examination.   I connected with  [name] on 10/11/20 by an audio only telemedicine application and verified that I am speaking with the correct person using two identifiers.   I discussed the limitations, risks, security and privacy concerns of performing an evaluation and management service by telephone and the availability of in person appointments. I also discussed with the patient that there may be a patient responsible charge related to this service. The patient expressed understanding and verbally consented to this telephonic visit.  Location of Patient: Home Location of Provider: Office  List any persons and their role that are participating in the visit with the patient.   Review of Systems    Defer to PCP       Objective:    There were no vitals filed for this visit. There is no height or weight on file to calculate BMI.  Advanced Directives 09/14/2020 09/14/2020 12/12/2018 09/26/2018 09/21/2018 10/13/2017 08/29/2017  Does Patient Have a Medical Advance Directive? No No Yes Yes Yes Yes Yes  Type of Advance Directive - - - Catering manager Living will [place];Living will  Does patient want to make changes to medical advance directive? - - - No - Patient declined - - No - Patient declined  Copy of [place] in Chart? - - - No - copy requested - - No - copy requested  Would patient like information on creating a medical advance directive? No - Patient declined - - - - - -  Pre-existing out of facility DNR order (yellow form or pink MOST form) - - - - - - -    Current Medications (verified) Outpatient Encounter Medications as of 10/11/2020  Medication Sig   acetaminophen (TYLENOL) 650 MG CR tablet Take 650 mg by mouth in the morning and at bedtime.   albuterol (VENTOLIN HFA) 108 (90 Base)  MCG/ACT inhaler INHALE 2 PUFFS INTO LUNGS EVERY 4 HOURS AS NEEDED FOR WHEEZING OR SHORTNESS OF BREATH (Patient taking differently: Inhale 2 puffs into the lungs every 4 (four) hours as needed for wheezing or shortness of breath.)   amLODipine (NORVASC) 5 MG tablet Take 1 tablet (5 mg total) by mouth daily.   Apoaequorin (PREVAGEN PO) Take 1 tablet by mouth daily.   atorvastatin (LIPITOR) 40 MG tablet Take 1 tablet (40 mg total) by mouth daily.   Biotin 5000 MCG TABS Take 5,000 mcg by mouth daily.   Calcium Carbonate (CALCIUM 600 PO) Take 1,200 mg by mouth daily.    carboxymethylcellulose (REFRESH PLUS) 0.5 % SOLN Place 2 drops into both eyes daily as needed (dry/irritated eyes.).   Coenzyme Q10 200 MG capsule Take 200 mg by mouth daily.   fluticasone (FLONASE) 50 MCG/ACT nasal spray Place 2 sprays into both nostrils daily.    furosemide (LASIX) 40 MG tablet Take 1 tablet (40 mg total) by mouth daily.   hydrALAZINE (APRESOLINE) 50 MG tablet Take 1 tablet (50 mg total) by mouth 3 (three) times daily.   metoprolol tartrate (LOPRESSOR) 100 MG tablet TAKE 1 TABLET BY MOUTH 2 TIMES DAILY (Patient taking differently: Take 100 mg by mouth 2 (two) times daily.)   Multiple Vitamins-Minerals (AIRBORNE PO) Take 1 tablet by mouth daily as needed (immune support). (Patient not taking: Reported on 09/24/2020)   Multiple Vitamins-Minerals (CENTRUM SILVER ULTRA WOMENS) TABS Take  1 tablet by mouth every evening.   polyethylene glycol (MIRALAX / GLYCOLAX) 17 g packet Take 17 g by mouth daily. (Patient not taking: Reported on 09/24/2020)   potassium chloride SA (KLOR-CON) 20 MEQ tablet Take 1 tablet (20 mEq total) by mouth 2 (two) times daily.   rivaroxaban (XARELTO) 20 MG TABS tablet Take 1 tablet (20 mg total) by mouth daily at 12 noon.   Turmeric 500 MG CAPS Take 500 mg by mouth daily.   No facility-administered encounter medications on file as of 10/11/2020.    Allergies (verified) Augmentin [amoxicillin-pot  clavulanate], Amoxicillin, and Clindamycin/lincomycin   History: Past Medical History:  Diagnosis Date   Abnormal EKG approx 2008   Nuclear stress test neg;    Acute pancreatitis 09/2020   idiopathic.  +panc psudocyst   Anxiety    with panic   CAP (community acquired pneumonia) 08/2017   Hospitalization for CAP/acute diast HF/rapid a-fib   Cataract    s/p surgery--lens implants   Chronic diastolic heart failure ([place]) 2019   Chronic renal insufficiency, stage 3 (moderate) ([place]) 12/04/2012   Renal u/s when in hosp 08/2017 for CAP/CHF showed symmetric kidneys, echogenicity normal, w/out hydronephrosis. Baseline GFR around 40 ml/min as of 10/2018.   DDD (degenerative disc disease), lumbar    Diverticulosis 2009   Fracture of radial shaft, left, closed 11/16/2010   fell down flight of stairs   History of kidney stones    Hx of adenomatous colonic polyps 2002;2009;2015   surveillance colonoscopy 2009, +polypectomy done-tubular adenoma w/out high grade.  05/2013 tubular adenomas--recall 3 yrs   Hyperlipidemia    Hypertension    Low TSH level 02/18/2016   T3 norm, T4 mildly elevated--suspected sick euthyroid syndrome.  Repeat labs 06/2016: normal.   Melanoma in situ ([place]) 06/2018   L LL   Osteoarthritis of both knees    viscosupplementation injections helpful 2020/21   Osteopenia    DEXA 08/2010; repeat DEXA 02/2015 worse: fosamax started.  06/2018 Dexa T score -2.4.  2020 maj osteop fx risk = 24%, Hip fx risk 7.3%. 09/2020 T score -2.1   PAF (paroxysmal atrial fibrillation) ([place]) 02/2016   when in post-op for ankle surgery; spontaneously converted in hosp, seen by Dr. Debara Pickett in consultation--metoprolol rate control + xarelto recommended.  Metop d/c due to hypot.  Plan to cont xarelto 20 mg qd indef due to CHAD-VASc score of 3.  A-fib w/RVR and CHF 07/2017; pt placed on amiodarone and plan for CV, but pt was in sinus rhythm when she went in for her DC CV, so she was sent home.   Peripheral edema     Pneumonia 2015   hx with sepsis   Rheumatic fever    Past Surgical History:  Procedure Laterality Date   APPENDECTOMY  1966   done during surgery for tubal pregnancy   CATARACT EXTRACTION W/ INTRAOCULAR LENS IMPLANT  2013   bilat   COLONOSCOPY W/ POLYPECTOMY  05/2013   +diverticulosis; recall 3 yrs (Dr. Henrene Pastor)   DEXA  02/2015; 06/2018   T score -2.1 in both femoral necks; FRAX 10 yr risk of major osteoporotic fracture was 21%---fosamax started. 06/2018 T score -2.4.  T score 09/2020 -2.1. Rpt 2 yrs.   ECTOPIC PREGNANCY SURGERY     EYE SURGERY     LEFT HEART CATH AND CORONARY ANGIOGRAPHY N/A 09/01/2017   No angiographically significant CAD.  Upper normal left ventricular filling pressure.  Procedure: LEFT HEART CATH AND CORONARY ANGIOGRAPHY;  Surgeon:  End, Harrell Gave, MD;  Location: [facility];  Service: Cardiovascular;  Laterality: N/A;   LUMBAR LAMINECTOMY/DECOMPRESSION MICRODISCECTOMY N/A 09/26/2018   Procedure: Decompressive Lumbar Laminectomy L5 S1 FORAMINOTOMY L5 S1  NERVE ROOT BILATERALLY and Microdiscectomy L5-S1 Left;  Surgeon: Latanya Maudlin, MD;  Location: [facility];  Service: Orthopedics;  Laterality: N/A;  13mn   OPEN REDUCTION INTERNAL FIXATION (ORIF) TIBIA/FIBULA FRACTURE Left 02/18/2016   Procedure: OPEN REDUCTION INTERNAL FIXATION (ORIF) Right ankle trimalleolar fracture;  Surgeon: JWylene Simmer MD;  [place] [facility]  Service: Orthopedics;  Laterality: Left;  requests 935ms   ORIF RADIAL FRACTURE  11/18/2010   left; s/p slip on slippery floor and fell   THORACENTESIS  08/2017   diagnostic and therapeutic.  Transudative.  Clx neg.  (+pulm edema/diastolic HF)   TONSILLECTOMY     TRANSTHORACIC ECHOCARDIOGRAM  02/18/2016; 08/10/17   LVEF of 55-60%, mild AI and mild MR and normal biatrial size.  07/2017--normal LV function, mild enlarge aortic root, mild/mod TR, bilat atrial enlargement.   Family History  Problem Relation Age of Onset   Heart disease Mother    Heart  disease Father    Hypertension Brother    Diabetes Sister    Colon cancer Neg Hx    Pancreatic cancer Neg Hx    Rectal cancer Neg Hx    Stomach cancer Neg Hx    Social History   Socioeconomic History   Marital status: Widowed    Spouse name: Not on file   Number of children: Not on file   Years of education: Not on file   Highest education level: Not on file  Occupational History   Not on file  Tobacco Use   Smoking status: Never   Smokeless tobacco: Never  Vaping Use   Vaping Use: Never used  Substance and Sexual Activity   Alcohol use: Yes    Comment: rarely   Drug use: Never   Sexual activity: Not on file  Other Topics Concern   Not on file  Social History Narrative   Widow, 2 sons.   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  Rare alcohol.   No drugs.  Exercise: 4 times per week, about 22m622m  Social Determinants of Health   Financial Resource Strain: Not on file  Food Insecurity: Not on file  Transportation Needs: Not on file  Physical Activity: Not on file  Stress: Not on file  Social Connections: Not on file    Tobacco Counseling Counseling given: Not Answered   Clinical Intake:                 Diabetic?No         Activities of Daily Living In your present state of health, do you have any difficulty performing the following activities: 09/14/2020  Hearing? N  Vision? N  Difficulty concentrating or making decisions? N  Walking or climbing stairs? Y  Comment secondary to weakness  Dressing or bathing? Y  Doing errands, shopping? N  Comment patient drives  Some recent data might be hidden    Patient Care Team: McGTammi SouD as PCP - General (Family Medicine) HilDebara PickettnNadean CorwinD as PCP - Cardiology (Cardiology) OrtIran PlanasD as Consulting Physician (Orthopedic Surgery) GioLatanya MaudlinD as Consulting Physician (Orthopedic Surgery) JonDanella SensingD as Consulting Physician (Dermatology) PerIrene ShipperD as  Consulting Physician (Gastroenterology) HilDebara PickettnNadean CorwinD as Consulting Physician (Cardiology) HewWylene SimmerD as Consulting Physician (Orthopedic Surgery) GooSarajane Jews  Herbie Baltimore, MD as Consulting Physician (Dermatology)  Indicate any recent Medical Services you may have received from other than [facility] providers in the past year (date may be approximate).     Assessment:   This is a routine wellness examination for [place].  Hearing/Vision screen No results found.  Dietary issues and exercise activities discussed:     Goals Addressed   None   Depression Screen PHQ 2/9 Scores 05/21/2020 07/10/2019 08/28/2017 08/01/2016 05/25/2015 03/06/2015 10/25/2014  PHQ - 2 Score 0 0 0 0 0 0 0    Fall Risk Fall Risk  05/21/2020 07/10/2019 08/28/2017 08/01/2016 05/25/2015  Falls in the past year? 0 0 No Yes No  Comment - - - slip on ice -  Number falls in past yr: 0 0 - 1 -  Injury with Fall? 0 0 - Yes -  Risk for fall due to : No Fall Risks - - - -  Follow up Falls evaluation completed Falls evaluation completed - Falls prevention discussed -    FALL RISK PREVENTION PERTAINING TO THE HOME:  Any stairs in or around the home? No  If so, are there any without handrails? No  Home free of loose throw rugs in walkways, pet beds, electrical cords, etc? Yes  Adequate lighting in your home to reduce risk of falls? Yes   ASSISTIVE DEVICES UTILIZED TO PREVENT FALLS:  Life alert? No  Use of a cane, walker or w/c? Yes  Grab bars in the bathroom? Yes  Shower chair or bench in shower? Yes  Elevated toilet seat or a handicapped toilet? Yes   TIMED UP AND GO:  Was the test performed? No .  Length of time to ambulate 10 feet: n/a sec.   Cognitive Function: MMSE - Mini Mental State Exam 08/28/2017  Orientation to time 5  Orientation to Place 5  Registration 3  Attention/ Calculation 5  Recall 2  Language- name 2 objects 2  Language- repeat 1  Language- follow 3 step command 3  Language- read & follow  direction 1  Write a sentence 1  Copy design 1  Total score 29        Immunizations Immunization History  Administered Date(s) Administered   Fluad Quad(high Dose 65+) 09/27/2018, 10/10/2019   H1N1 01/07/2008   Hepatitis A 08/06/2010   IPV 08/06/2010   Influenza Split 11/01/2010, 11/15/2011   Influenza Whole 12/27/2006, 11/07/2007, 10/31/2008, 12/29/2009   Influenza, High Dose Seasonal PF 11/01/2010, 11/15/2011, 11/20/2013, 10/17/2014, 09/30/2015, 09/26/2016, 10/04/2017, 09/27/2018   Influenza,inj,Quad PF,6+ Mos 11/20/2013   Influenza-Unspecified 09/30/2015   Meningococcal Conjugate 08/06/2010   Moderna Sars-Covid-2 Vaccination 12/18/2018, 01/18/2019, 11/12/2019   Pneumococcal Conjugate-13 07/16/2013   Pneumococcal Polysaccharide-23 11/07/2007   Td 12/10/2008   Tdap 12/12/2018   Typhoid Live 08/06/2010   Yellow Fever 08/06/2010   Zoster, Live 04/05/2007    TDAP status: Up to date  Flu Vaccine status: Up to date  Pneumococcal vaccine status: Up to date  Covid-19 vaccine status: Completed vaccines  Qualifies for Shingles Vaccine? Yes   Zostavax completed Yes   Shingrix Completed?: No.    Education has been provided regarding the importance of this vaccine. Patient has been advised to call insurance company to determine out of pocket expense if they have not yet received this vaccine. Advised may also receive vaccine at local pharmacy or Health Dept. Verbalized acceptance and understanding.  Screening Tests Health Maintenance  Topic Date Due   Zoster Vaccines- Shingrix (1 of 2) Never done  COLONOSCOPY (Pts 45-19yr Insurance coverage will need to be confirmed)  05/30/2016   COVID-19 Vaccine (4 - Booster for Moderna series) 02/04/2020   INFLUENZA VACCINE  08/17/2020   MAMMOGRAM  10/01/2021   TETANUS/TDAP  12/11/2028   DEXA SCAN  Completed   HPV VACCINES  Aged Out    Health Maintenance  Health Maintenance Due  Topic Date Due   Zoster Vaccines- Shingrix (1 of 2)  Never done   COLONOSCOPY (Pts 45-475yrInsurance coverage will need to be confirmed)  05/30/2016   COVID-19 Vaccine (4 - Booster for Moderna series) 02/04/2020   INFLUENZA VACCINE  08/17/2020    Colorectal cancer screening: No longer required.   Mammogram status: Completed 10/01/20. Repeat every year  Bone Density status: Completed 10/01/20. Results reflect: Bone density results: NORMAL. Repeat every 2 years.  Lung Cancer Screening: (Low Dose CT Chest recommended if Age 83-80ears, 30 pack-year currently smoking OR have quit w/in 15years.) does not qualify.   Lung Cancer Screening Referral: n/a  Additional Screening:  Hepatitis C Screening: does not qualify; Completed n/a  Vision Screening: Recommended annual ophthalmology exams for early detection of glaucoma and other disorders of the eye. Is the patient up to date with their annual eye exam?  Yes  Who is the provider or what is the name of the office in which the patient attends annual eye exams? Dr. LyPrudencio Burlyf pt is not established with a provider, would they like to be referred to a provider to establish care?  N/a .   Dental Screening: Recommended annual dental exams for proper oral hygiene  Community Resource Referral / Chronic Care Management: CRR required this visit?  No   CCM required this visit?  No      Plan:     I have personally reviewed and noted the following in the patient's chart:   Medical and social history Use of alcohol, tobacco or illicit drugs  Current medications and supplements including opioid prescriptions.  Functional ability and status Nutritional status Physical activity Advanced directives List of other physicians Hospitalizations, surgeries, and ER visits in previous 12 months Vitals Screenings to include cognitive, depression, and falls Referrals and appointments  In addition, I have reviewed and discussed with patient certain preventive protocols, quality metrics, and best practice  recommendations. A written personalized care plan for preventive services as well as general preventive health recommendations were provided to patient.     [facility] 10/11/2020   Nurse Notes: Non face to face time 16 minutes.  Ms. CoShands Thank you for taking time to come for your Medicare Wellness Visit. I appreciate your ongoing commitment to your health goals. Please review the following plan we discussed and let me know if I can assist you in the future.   These are the goals we discussed:  Goals      Patient Stated     Maintain health by staying active.      Weight (lb) < 140 lb (63.5 kg)     Lose weight by continuing Weight Watchers and increasing activity.          This is a list of the screening recommended for you and due dates:  Health Maintenance  Topic Date Due   Zoster (Shingles) Vaccine (1 of 2) Never done   Colon Cancer Screening  05/30/2016   COVID-19 Vaccine (4 - Booster for Moderna series) 02/04/2020   Flu Shot  04/16/2021*   Mammogram  10/01/2021  Tetanus Vaccine  12/11/2028   DEXA scan (bone density measurement)  Completed   HPV Vaccine  Aged Out  *Topic was postponed. The date shown is not the original due date.     I agree with the above data. Signed:  Crissie Sickles, MD           10/15/2020

## 2020-10-13 DIAGNOSIS — R2681 Unsteadiness on feet: Secondary | ICD-10-CM | POA: Diagnosis not present

## 2020-10-13 DIAGNOSIS — K8500 Idiopathic acute pancreatitis without necrosis or infection: Secondary | ICD-10-CM | POA: Diagnosis not present

## 2020-10-13 DIAGNOSIS — M6281 Muscle weakness (generalized): Secondary | ICD-10-CM | POA: Diagnosis not present

## 2020-10-13 DIAGNOSIS — R278 Other lack of coordination: Secondary | ICD-10-CM | POA: Diagnosis not present

## 2020-10-13 DIAGNOSIS — R2689 Other abnormalities of gait and mobility: Secondary | ICD-10-CM | POA: Diagnosis not present

## 2020-10-14 ENCOUNTER — Other Ambulatory Visit: Payer: Self-pay | Admitting: Internal Medicine

## 2020-10-14 ENCOUNTER — Other Ambulatory Visit: Payer: Self-pay | Admitting: Family Medicine

## 2020-10-14 DIAGNOSIS — R2689 Other abnormalities of gait and mobility: Secondary | ICD-10-CM | POA: Diagnosis not present

## 2020-10-14 DIAGNOSIS — M6281 Muscle weakness (generalized): Secondary | ICD-10-CM | POA: Diagnosis not present

## 2020-10-14 DIAGNOSIS — K8500 Idiopathic acute pancreatitis without necrosis or infection: Secondary | ICD-10-CM | POA: Diagnosis not present

## 2020-10-14 DIAGNOSIS — R2681 Unsteadiness on feet: Secondary | ICD-10-CM | POA: Diagnosis not present

## 2020-10-14 DIAGNOSIS — R278 Other lack of coordination: Secondary | ICD-10-CM | POA: Diagnosis not present

## 2020-10-16 DIAGNOSIS — R278 Other lack of coordination: Secondary | ICD-10-CM | POA: Diagnosis not present

## 2020-10-16 DIAGNOSIS — K8500 Idiopathic acute pancreatitis without necrosis or infection: Secondary | ICD-10-CM | POA: Diagnosis not present

## 2020-10-16 DIAGNOSIS — R2689 Other abnormalities of gait and mobility: Secondary | ICD-10-CM | POA: Diagnosis not present

## 2020-10-16 DIAGNOSIS — M6281 Muscle weakness (generalized): Secondary | ICD-10-CM | POA: Diagnosis not present

## 2020-10-16 DIAGNOSIS — R2681 Unsteadiness on feet: Secondary | ICD-10-CM | POA: Diagnosis not present

## 2020-10-19 ENCOUNTER — Other Ambulatory Visit: Payer: Self-pay | Admitting: Internal Medicine

## 2020-10-26 DIAGNOSIS — R2689 Other abnormalities of gait and mobility: Secondary | ICD-10-CM | POA: Diagnosis not present

## 2020-10-26 DIAGNOSIS — R2681 Unsteadiness on feet: Secondary | ICD-10-CM | POA: Diagnosis not present

## 2020-10-26 DIAGNOSIS — K8500 Idiopathic acute pancreatitis without necrosis or infection: Secondary | ICD-10-CM | POA: Diagnosis not present

## 2020-10-26 DIAGNOSIS — M6281 Muscle weakness (generalized): Secondary | ICD-10-CM | POA: Diagnosis not present

## 2020-10-26 DIAGNOSIS — R278 Other lack of coordination: Secondary | ICD-10-CM | POA: Diagnosis not present

## 2020-10-27 ENCOUNTER — Telehealth: Payer: Self-pay | Admitting: Student

## 2020-10-27 NOTE — Telephone Encounter (Signed)
*  STAT* If patient is at the pharmacy, call can be transferred to refill team.   1. Which medications need to be refilled? (please list name of each medication and dose if known) rivaroxaban (XARELTO) 20 MG TABS tablet  2. Which pharmacy/location (including street and city if local pharmacy) is medication to be sent to? [facility] - [place], [place] - [address]  3. Do they need a 30 day or 90 day supply? 90 day   Patient only has 1 tablet left for tomorrow. Patient states the prescription was increased from 15 mg to 20 mg, but the pharmacy has not gotten the new prescription.

## 2020-10-28 ENCOUNTER — Other Ambulatory Visit: Payer: Self-pay

## 2020-10-28 ENCOUNTER — Telehealth: Payer: Self-pay | Admitting: Internal Medicine

## 2020-10-28 ENCOUNTER — Encounter: Payer: Self-pay | Admitting: Internal Medicine

## 2020-10-28 ENCOUNTER — Ambulatory Visit: Payer: Medicare PPO | Admitting: Internal Medicine

## 2020-10-28 ENCOUNTER — Encounter: Payer: Self-pay | Admitting: Family Medicine

## 2020-10-28 ENCOUNTER — Ambulatory Visit (INDEPENDENT_AMBULATORY_CARE_PROVIDER_SITE_OTHER): Payer: Medicare PPO | Admitting: Family Medicine

## 2020-10-28 VITALS — BP 124/68 | HR 64 | Ht 61.0 in | Wt 167.0 lb

## 2020-10-28 VITALS — BP 119/65 | HR 65 | Temp 98.0°F | Resp 16 | Ht 61.0 in | Wt 166.8 lb

## 2020-10-28 DIAGNOSIS — K862 Cyst of pancreas: Secondary | ICD-10-CM | POA: Diagnosis not present

## 2020-10-28 DIAGNOSIS — I5032 Chronic diastolic (congestive) heart failure: Secondary | ICD-10-CM

## 2020-10-28 DIAGNOSIS — I480 Paroxysmal atrial fibrillation: Secondary | ICD-10-CM

## 2020-10-28 DIAGNOSIS — Z86010 Personal history of colonic polyps: Secondary | ICD-10-CM | POA: Diagnosis not present

## 2020-10-28 DIAGNOSIS — K8500 Idiopathic acute pancreatitis without necrosis or infection: Secondary | ICD-10-CM | POA: Diagnosis not present

## 2020-10-28 DIAGNOSIS — N1831 Chronic kidney disease, stage 3a: Secondary | ICD-10-CM | POA: Diagnosis not present

## 2020-10-28 DIAGNOSIS — R935 Abnormal findings on diagnostic imaging of other abdominal regions, including retroperitoneum: Secondary | ICD-10-CM

## 2020-10-28 DIAGNOSIS — I10 Essential (primary) hypertension: Secondary | ICD-10-CM | POA: Diagnosis not present

## 2020-10-28 DIAGNOSIS — Z8719 Personal history of other diseases of the digestive system: Secondary | ICD-10-CM | POA: Diagnosis not present

## 2020-10-28 DIAGNOSIS — Z23 Encounter for immunization: Secondary | ICD-10-CM | POA: Diagnosis not present

## 2020-10-28 LAB — COMPREHENSIVE METABOLIC PANEL
ALT: 15 U/L (ref 0–35)
AST: 20 U/L (ref 0–37)
Albumin: 3.9 g/dL (ref 3.5–5.2)
Alkaline Phosphatase: 88 U/L (ref 39–117)
BUN: 21 mg/dL (ref 6–23)
CO2: 32 mEq/L (ref 19–32)
Calcium: 9.8 mg/dL (ref 8.4–10.5)
Chloride: 105 mEq/L (ref 96–112)
Creatinine, Ser: 1.14 mg/dL (ref 0.40–1.20)
GFR: 44.45 mL/min — ABNORMAL LOW (ref >60.00)
Glucose, Bld: 132 mg/dL — ABNORMAL HIGH (ref 70–99)
Potassium: 3.9 mEq/L (ref 3.5–5.1)
Sodium: 144 mEq/L (ref 135–145)
Total Bilirubin: 0.5 mg/dL (ref 0.2–1.2)
Total Protein: 6.6 g/dL (ref 6.0–8.3)

## 2020-10-28 MED ORDER — RIVAROXABAN 15 MG PO TABS: 15.0000 mg | ORAL_TABLET | Freq: Every day | ORAL | 1 refills | Status: DC

## 2020-10-28 NOTE — Telephone Encounter (Signed)
Pt is completely out of this medicine

## 2020-10-28 NOTE — Progress Notes (Signed)
10/28/2020  CC:  Chief Complaint  Patient presents with   Hospitalization Follow-up   Patient is a 85 y.o. Caucasian female who presents for  hospital follow up Dates hospitalized: 8/29-9/3, 2022. Days since d/c from hospital: 39 days. Patient was discharged from hospital to SNF. Reason for admission to hospital: acute abd pain and nausea, was found to have acute pancreatitis on CT scan.   I have reviewed patient's discharge summary plus pertinent specific notes, labs, and imaging from the hospitalization.   GI felt like her pancreatitis was likely idiopathic. No necrosis or infection.  She did have a pseudocyst in pancreatic tail. She had improvement with pain mgmt and was able to gradually advance diet.  Was d/c'd on prn tramadol. She did develop a UTI in hosp, G neg rods, rocephin x 2d and then d/c on keflex x 5d. She remained on all of her chronic medications.  CURRENTLY:  She stayed in SNF 10d then went back home.  She is getting [facility] PT for general recuperation purposes. She saw GI for f/u this morning. She has felt fine regarding abd, no nausea.  Eating fine. Eating and drinking well/normal.   PMH:  Past Medical History:  Diagnosis Date   Abnormal EKG approx 2008   Nuclear stress test neg;    Acute pancreatitis 09/2020   idiopathic.  +panc psudocyst   Anxiety    with panic   CAP (community acquired pneumonia) 08/2017   Hospitalization for CAP/acute diast HF/rapid a-fib   Cataract    s/p surgery--lens implants   Chronic diastolic heart failure ([place]) 2019   Chronic renal insufficiency, stage 3 (moderate) ([place]) 12/04/2012   Renal u/s when in hosp 08/2017 for CAP/CHF showed symmetric kidneys, echogenicity normal, w/out hydronephrosis. Baseline GFR around 40 ml/min as of 10/2018.   DDD (degenerative disc disease), lumbar    Diverticulosis 2009   Fracture of radial shaft, left, closed 11/16/2010   fell down flight of stairs   History of kidney stones    Hx of  adenomatous colonic polyps 2002;2009;2015   surveillance colonoscopy 2009, +polypectomy done-tubular adenoma w/out high grade.  05/2013 tubular adenomas--recall 3 yrs   Hyperlipidemia    Hypertension    Low TSH level 02/18/2016   T3 norm, T4 mildly elevated--suspected sick euthyroid syndrome.  Repeat labs 06/2016: normal.   Melanoma in situ ([place]) 06/2018   L LL   Osteoarthritis of both knees    viscosupplementation injections helpful 2020/21   Osteopenia    DEXA 08/2010; repeat DEXA 02/2015 worse: fosamax started.  06/2018 Dexa T score -2.4.  2020 maj osteop fx risk = 24%, Hip fx risk 7.3%. 09/2020 T score -2.1   PAF (paroxysmal atrial fibrillation) ([place]) 02/2016   when in post-op for ankle surgery; spontaneously converted in hosp, seen by Dr. Debara Pickett in consultation--metoprolol rate control + xarelto recommended.  Metop d/c due to hypot.  Plan to cont xarelto 20 mg qd indef due to CHAD-VASc score of 3.  A-fib w/RVR and CHF 07/2017; pt placed on amiodarone and plan for CV, but pt was in sinus rhythm when she went in for her DC CV, so she was sent home.   Peripheral edema    Pneumonia 2015   hx with sepsis   Rheumatic fever     PSH:  Past Surgical History:  Procedure Laterality Date   APPENDECTOMY  1966   done during surgery for tubal pregnancy   CATARACT EXTRACTION W/ INTRAOCULAR LENS IMPLANT  2013   bilat  COLONOSCOPY W/ POLYPECTOMY  05/2013   +diverticulosis; recall 3 yrs (Dr. Henrene Pastor)   DEXA  02/2015; 06/2018   T score -2.1 in both femoral necks; FRAX 10 yr risk of major osteoporotic fracture was 21%---fosamax started. 06/2018 T score -2.4.  T score 09/2020 -2.1. Rpt 2 yrs.   ECTOPIC PREGNANCY SURGERY     EYE SURGERY     LEFT HEART CATH AND CORONARY ANGIOGRAPHY N/A 09/01/2017   No angiographically significant CAD.  Upper normal left ventricular filling pressure.  Procedure: LEFT HEART CATH AND CORONARY ANGIOGRAPHY;  Surgeon: Nelva Bush, MD;  Location: [place] [facility];  Service:  Cardiovascular;  Laterality: N/A;   LUMBAR LAMINECTOMY/DECOMPRESSION MICRODISCECTOMY N/A 09/26/2018   Procedure: Decompressive Lumbar Laminectomy L5 S1 FORAMINOTOMY L5 S1  NERVE ROOT BILATERALLY and Microdiscectomy L5-S1 Left;  Surgeon: Latanya Maudlin, MD;  Location: [facility];  Service: Orthopedics;  Laterality: N/A;  135mn   OPEN REDUCTION INTERNAL FIXATION (ORIF) TIBIA/FIBULA FRACTURE Left 02/18/2016   Procedure: OPEN REDUCTION INTERNAL FIXATION (ORIF) Right ankle trimalleolar fracture;  Surgeon: JWylene Simmer MD;  [facility]: [facility]  Service: Orthopedics;  Laterality: Left;  requests 983ms   ORIF RADIAL FRACTURE  11/18/2010   left; s/p slip on slippery floor and fell   THORACENTESIS  08/2017   diagnostic and therapeutic.  Transudative.  Clx neg.  (+pulm edema/diastolic HF)   TONSILLECTOMY     TRANSTHORACIC ECHOCARDIOGRAM  02/18/2016; 08/10/17   LVEF of 55-60%, mild AI and mild MR and normal biatrial size.  07/2017--normal LV function, mild enlarge aortic root, mild/mod TR, bilat atrial enlargement.    MEDS:  Outpatient Medications Prior to Visit  Medication Sig Dispense Refill   acetaminophen (TYLENOL) 650 MG CR tablet Take 650 mg by mouth in the morning and at bedtime.     albuterol (VENTOLIN HFA) 108 (90 Base) MCG/ACT inhaler INHALE 2 PUFFS INTO LUNGS EVERY 4 HOURS AS NEEDED FOR WHEEZING OR SHORTNESS OF BREATH (Patient taking differently: Inhale 2 puffs into the lungs every 4 (four) hours as needed for wheezing or shortness of breath.) 18 g 1   amLODipine (NORVASC) 5 MG tablet TAKE 1 TABLET BY MOUTH EVERY DAY 90 tablet 0   Apoaequorin (PREVAGEN PO) Take 1 tablet by mouth daily.     atorvastatin (LIPITOR) 40 MG tablet Take 1 tablet (40 mg total) by mouth daily. 90 tablet 3   Biotin 5000 MCG TABS Take 5,000 mcg by mouth daily.     Calcium Carbonate (CALCIUM 600 PO) Take 1,200 mg by mouth daily.      carboxymethylcellulose (REFRESH PLUS) 0.5 % SOLN Place 2 drops into both eyes daily as needed  (dry/irritated eyes.).     Coenzyme Q10 200 MG capsule Take 200 mg by mouth daily.     fluticasone (FLONASE) 50 MCG/ACT nasal spray Place 2 sprays into both nostrils daily.      furosemide (LASIX) 40 MG tablet Take 1 tablet (40 mg total) by mouth daily. 90 tablet 3   hydrALAZINE (APRESOLINE) 50 MG tablet Take 1 tablet (50 mg total) by mouth 3 (three) times daily. 270 tablet 3   metoprolol tartrate (LOPRESSOR) 100 MG tablet TAKE 1 TABLET BY MOUTH 2 TIMES DAILY 180 tablet 0   Multiple Vitamins-Minerals (AIRBORNE PO) Take 1 tablet by mouth daily as needed (immune support).     Multiple Vitamins-Minerals (CENTRUM SILVER ULTRA WOMENS) TABS Take 1 tablet by mouth every evening.     potassium chloride SA (KLOR-CON) 20 MEQ tablet TAKE 1 TABLET  BY MOUTH 2 TIMES DAILY 180 tablet 3   rivaroxaban (XARELTO) 20 MG TABS tablet Take 1 tablet (20 mg total) by mouth daily at 12 noon. 30 tablet    No facility-administered medications prior to visit.   EXAM:  Vitals with BMI 10/28/2020 10/28/2020 09/24/2020  Height 5' 1"  5' 1"  5' 1"   Weight 166 lbs 13 oz 167 lbs 173 lbs  BMI 31.53 35.32 99.2  Systolic 426 834 196  Diastolic 65 68 70  Pulse 65 64 73   Gen: Alert, well appearing.  Patient is oriented to person, place, time, and situation. AFFECT: pleasant, lucid thought and speech. CV: RRR, no m/r/g.   LUNGS: CTA bilat, nonlabored resps, good aeration in all lung fields. 1-2+ bilat LL pitting edema---has compression stockings on.  Pertinent labs/imaging   Chemistry      Component Value Date/Time   NA 140 09/24/2020 1119   K 4.6 09/24/2020 1119   CL 104 09/24/2020 1119   CO2 23 09/24/2020 1119   BUN 13 09/24/2020 1119   CREATININE 0.86 09/24/2020 1119   CREATININE 1.23 (H) 10/23/2017 0938      Component Value Date/Time   CALCIUM 9.3 09/24/2020 1119   ALKPHOS 134 (H) 09/24/2020 1119   AST 23 09/24/2020 1119   ALT 23 09/24/2020 1119   BILITOT 0.3 09/24/2020 1119    BNP    Component Value  Date/Time   BNP 654.9 (H) 09/06/2017 0322   ProBNP    Component Value Date/Time   PROBNP 2,759 (H) 09/24/2020 1119   PROBNP 574.0 (H) 08/04/2017 1447    Lab Results  Component Value Date   WBC 8.2 09/24/2020   HGB 11.7 09/24/2020   HCT 36.4 09/24/2020   MCV 90 09/24/2020   PLT 460 (H) 09/24/2020   Lab Results  Component Value Date   TSH 0.68 10/22/2018   Lab Results  Component Value Date   LIPASE 47 09/16/2020   ASSESSMENT/PLAN:  #1: Acute pancreatitis, idiopathic, resolved appropriately in hospital with bowel rest and pain medication.  She does have a pseudocyst and will be getting follow-up in 6 months with gastroenterology.  ABD/GI symptoms at this time. Checking liver panel today.  #2: Chronic renal insufficiency III: She avoids NSAIDs and hydrates well.  She did get dye for her CT scan in the hospital but had no bump in her creatinine at all.  [place] and creatinine level today.  #3 diastolic dysfunction.  Had a little bit of fluid overload when at the cardiologist weeks ago.  She got a few days of increased Lasix dosing.  Lower extremity edema seems at baseline and she is completely without any shortness of breath or dyspnea on exertion. Continue Lasix 40 mg/day  #4 Paroxysmal atrial fibrillation: Normal rhythm today.  Continue Lopressor 100 twice daily and Xarelto milligrams per day. Checking electrolytes today.  Hemoglobin stable at 11.7 on labs 1 month ago.  #5: Hypertension, well controlled on amlodipine 5 mg daily hydralazine 50 mg 3 times daily and Lopressor 100 mg twice daily  FOLLOW UP:  4 mo CPE  Signed:  Crissie Sickles, MD           10/28/2020

## 2020-10-28 NOTE — Telephone Encounter (Signed)
*  STAT* If patient is at the pharmacy, call can be transferred to refill team.   1. Which medications need to be refilled? (please list name of each medication and dose if known) rivaroxaban (XARELTO) 20 MG TABS tablet  2. Which pharmacy/location (including street and city if local pharmacy) is medication to be sent to? [facility] - [place], [place] - [address]  3. Do they need a 30 day or 90 day supply? 90 day supply  PT IS COMPLETELY OUT OF THIS MEDICINE  PLEASE SEND REFILL TO [facility]

## 2020-10-28 NOTE — Patient Instructions (Addendum)
If you are age 83 or older, your body mass index should be between 23-30. Your Body mass index is 31.55 kg/m. If this is out of the aforementioned range listed, please consider follow up with your Primary Care Provider.  If you are age 51 or younger, your body mass index should be between 19-25. Your Body mass index is 31.55 kg/m. If this is out of the aformentioned range listed, please consider follow up with your Primary Care Provider.   __________________________________________________________  The [place] [facility] providers would like to encourage you to use [facility] to communicate with providers for non-urgent requests or questions.  Due to long hold times on the telephone, sending your provider a message by [facility] may be a faster and more efficient way to get a response.  Please allow 48 business hours for a response.  Please remember that this is for non-urgent requests.    I will call you in about 6 months to set up a CT scan and then follow up with Dr. Henrene Pastor

## 2020-10-28 NOTE — Telephone Encounter (Signed)
Prescription refill request for Xarelto received.  Indication:Afib Last office visit:9/22 Weight:75.7 kg Age:85 Scr:1.1 CrCl:46.31 ml/min  Reduced to Xarelto 15 mg Daily per Karren Cobble.

## 2020-10-28 NOTE — Telephone Encounter (Signed)
Patient is calling back because she took her last pill today and needs the refills

## 2020-10-29 ENCOUNTER — Encounter: Payer: Self-pay | Admitting: Internal Medicine

## 2020-10-29 NOTE — Progress Notes (Signed)
HISTORY OF PRESENT ILLNESS:  Brenda Matthews is a 85 y.o. female with multiple significant medical problems as listed below who presents today for post hospital follow-up after being admitted with acute pancreatitis from September 14, 2020 through September 19, 2020.  She was evaluated by the inpatient GI team.  No etiology for her pancreatitis was ascertained.  This was her first such bout.  CT and MRI imaging both revealed a 3 cm cystic lesion in the pancreatic tail with surrounding edema or inflammation.  No high risk features noted.  Since her discharge she has been feeling well.  She has questions regarding her diagnosis.  She also has a history of adenomatous colon polyps and last underwent surveillance colonoscopy May 2015.  Follow-up in 3 years recommended.  She was evaluated in the office in September 2020 regarding surveillance colonoscopy.  There were plans to arrange for colonoscopy, but this was not performed.  She inquires about colonoscopy.  Her GI review of systems at this time is entirely negative.  Review of blood work from September 24, 2020 shows unremarkable comprehensive metabolic panel.  Elevated BNP 2759.  Hemoglobin 11.7.  She is on chronic anticoagulation.  REVIEW OF SYSTEMS:  All non-GI ROS negative unless otherwise stated in the HPI.  Past Medical History:  Diagnosis Date   Abnormal EKG approx 2008   Nuclear stress test neg;    Acute pancreatitis 09/2020   idiopathic.  +panc psudocyst   Anxiety    with panic   CAP (community acquired pneumonia) 08/2017   Hospitalization for CAP/acute diast HF/rapid a-fib   Cataract    s/p surgery--lens implants   Chronic diastolic heart failure ([place]) 2019   Chronic renal insufficiency, stage 3 (moderate) ([place]) 12/04/2012   Renal u/s when in hosp 08/2017 for CAP/CHF showed symmetric kidneys, echogenicity normal, w/out hydronephrosis. Baseline GFR around 40 ml/min as of 10/2018.   DDD (degenerative disc disease), lumbar    Diverticulosis 2009    Fracture of radial shaft, left, closed 11/16/2010   fell down flight of stairs   History of kidney stones    Hx of adenomatous colonic polyps 2002;2009;2015   surveillance colonoscopy 2009, +polypectomy done-tubular adenoma w/out high grade.  05/2013 tubular adenomas--recall 3 yrs   Hyperlipidemia    Hypertension    Low TSH level 02/18/2016   T3 norm, T4 mildly elevated--suspected sick euthyroid syndrome.  Repeat labs 06/2016: normal.   Melanoma in situ ([place]) 06/2018   L LL   Osteoarthritis of both knees    viscosupplementation injections helpful 2020/21   Osteopenia    DEXA 08/2010; repeat DEXA 02/2015 worse: fosamax started.  06/2018 Dexa T score -2.4.  2020 maj osteop fx risk = 24%, Hip fx risk 7.3%. 09/2020 T score -2.1   PAF (paroxysmal atrial fibrillation) ([place]) 02/2016   when in post-op for ankle surgery; spontaneously converted in hosp, seen by Dr. Debara Pickett in consultation--metoprolol rate control + xarelto recommended.  Metop d/c due to hypot.  Plan to cont xarelto 20 mg qd indef due to CHAD-VASc score of 3.  A-fib w/RVR and CHF 07/2017; pt placed on amiodarone and plan for CV, but pt was in sinus rhythm when she went in for her DC CV, so she was sent home.   Peripheral edema    Pneumonia 2015   hx with sepsis   Rheumatic fever     Past Surgical History:  Procedure Laterality Date   APPENDECTOMY  1966   done during surgery for tubal pregnancy  CATARACT EXTRACTION W/ INTRAOCULAR LENS IMPLANT  2013   bilat   COLONOSCOPY W/ POLYPECTOMY  05/2013   +diverticulosis; recall 3 yrs (Dr. Henrene Pastor)   [place]  02/2015; 06/2018   T score -2.1 in both femoral necks; FRAX 10 yr risk of major osteoporotic fracture was 21%---fosamax started. 06/2018 T score -2.4.  T score 09/2020 -2.1. Rpt 2 yrs.   ECTOPIC PREGNANCY SURGERY     EYE SURGERY     LEFT HEART CATH AND CORONARY ANGIOGRAPHY N/A 09/01/2017   No angiographically significant CAD.  Upper normal left ventricular filling pressure.  Procedure: LEFT  HEART CATH AND CORONARY ANGIOGRAPHY;  Surgeon: Nelva Bush, MD;  Location: [place] [facility];  Service: Cardiovascular;  Laterality: N/A;   LUMBAR LAMINECTOMY/DECOMPRESSION MICRODISCECTOMY N/A 09/26/2018   Procedure: Decompressive Lumbar Laminectomy L5 S1 FORAMINOTOMY L5 S1  NERVE ROOT BILATERALLY and Microdiscectomy L5-S1 Left;  Surgeon: Latanya Maudlin, MD;  [facility];  Service: Orthopedics;  Laterality: N/A;  138mn   OPEN REDUCTION INTERNAL FIXATION (ORIF) TIBIA/FIBULA FRACTURE Left 02/18/2016   Procedure: OPEN REDUCTION INTERNAL FIXATION (ORIF) Right ankle trimalleolar fracture;  Surgeon: JWylene Simmer MD;  [facility]  Service: Orthopedics;  Laterality: Left;  requests 965ms   ORIF RADIAL FRACTURE  11/18/2010   left; s/p slip on slippery floor and fell   THORACENTESIS  08/2017   diagnostic and therapeutic.  Transudative.  Clx neg.  (+pulm edema/diastolic HF)   TONSILLECTOMY     TRANSTHORACIC ECHOCARDIOGRAM  02/18/2016; 08/10/17   LVEF of 55-60%, mild AI and mild MR and normal biatrial size.  07/2017--normal LV function, mild enlarge aortic root, mild/mod TR, bilat atrial enlargement.    Social History HeALETHA ALLEBACHreports that she has never smoked. She has never used smokeless tobacco. She reports current alcohol use. She reports that she does not use drugs.  family history includes Diabetes in her sister; Heart disease in her father and mother; Hypertension in her brother.  Allergies  Allergen Reactions   Augmentin [Amoxicillin-Pot Clavulanate] Nausea And Vomiting and Other (See Comments)    "projectile vomiting" Has patient had a PCN reaction causing immediate rash, facial/tongue/throat swelling, SOB or lightheadedness with hypotension:No Has patient had a PCN reaction causing severe rash involving mucus membranes or skin necrosis:No Has patient had a PCN reaction that required hospitalization:No Has patient had a PCN reaction occurring within the last 10  years:Yes If all of the above answers are "NO", then may proceed with Cephalosporin use.    Amoxicillin Rash   Clindamycin/Lincomycin Rash       PHYSICAL EXAMINATION: Vital signs: BP 124/68   Pulse 64   Ht 5' 1"  (1.549 m)   Wt 167 lb (75.8 kg)   LMP  (LMP Unknown)   BMI 31.55 kg/m   Constitutional: generally well-appearing, no acute distress Psychiatric: alert and oriented x3, cooperative Eyes: extraocular movements intact, anicteric, conjunctiva pink Mouth: oral pharynx moist, no lesions Neck: supple no lymphadenopathy Cardiovascular: heart regular rate and rhythm, no murmur Lungs: clear to auscultation bilaterally Abdomen: soft, nontender, nondistended, no obvious ascites, no peritoneal signs, normal bowel sounds, no organomegaly Rectal: Omitted Extremities: no clubbing, cyanosis, or lower extremity edema bilaterally Skin: no lesions on visible extremities Neuro: No focal deficits.  Cranial nerves intact  ASSESSMENT:  1.  Acute pancreatitis.  No obvious etiology.  Question related to cystic lesion in the tail of the pancreas.  Currently asymptomatic. 2.  Abnormal CT and MRI of the pancreas as described. 3.  History of multiple and advanced adenomatous colon polyps.  Last colonoscopy 2015   PLAN:  1.  Discussion on pancreatitis 2.  Low-fat diet 3.  Repeat CT of the pancreas with and without contrast in 6 months.  This to reassess the tail of the pancreas lesion.  At that time, we can determine if EUS would be of any value. 4.  Office follow-up at the time of her repeat imaging. 5.  Interval follow-up as needed A total time of 45 minutes was spent preparing to see the patient, reviewing multiple laboratories and x-rays.  Reviewing hospitalization records, obtaining comprehensive history, performing comprehensive physical examination, counseling the patient regarding her above listed issues, ordering advanced imaging and clinical follow-up, and documenting clinical  information in the health record

## 2020-11-10 DIAGNOSIS — R2689 Other abnormalities of gait and mobility: Secondary | ICD-10-CM | POA: Diagnosis not present

## 2020-11-10 DIAGNOSIS — R2681 Unsteadiness on feet: Secondary | ICD-10-CM | POA: Diagnosis not present

## 2020-11-10 DIAGNOSIS — R278 Other lack of coordination: Secondary | ICD-10-CM | POA: Diagnosis not present

## 2020-11-10 DIAGNOSIS — K8500 Idiopathic acute pancreatitis without necrosis or infection: Secondary | ICD-10-CM | POA: Diagnosis not present

## 2020-11-10 DIAGNOSIS — M6281 Muscle weakness (generalized): Secondary | ICD-10-CM | POA: Diagnosis not present

## 2020-11-11 DIAGNOSIS — R2681 Unsteadiness on feet: Secondary | ICD-10-CM | POA: Diagnosis not present

## 2020-11-11 DIAGNOSIS — R278 Other lack of coordination: Secondary | ICD-10-CM | POA: Diagnosis not present

## 2020-11-11 DIAGNOSIS — K8500 Idiopathic acute pancreatitis without necrosis or infection: Secondary | ICD-10-CM | POA: Diagnosis not present

## 2020-11-11 DIAGNOSIS — R2689 Other abnormalities of gait and mobility: Secondary | ICD-10-CM | POA: Diagnosis not present

## 2020-11-11 DIAGNOSIS — M6281 Muscle weakness (generalized): Secondary | ICD-10-CM | POA: Diagnosis not present

## 2020-11-12 DIAGNOSIS — R2681 Unsteadiness on feet: Secondary | ICD-10-CM | POA: Diagnosis not present

## 2020-11-12 DIAGNOSIS — R278 Other lack of coordination: Secondary | ICD-10-CM | POA: Diagnosis not present

## 2020-11-12 DIAGNOSIS — M6281 Muscle weakness (generalized): Secondary | ICD-10-CM | POA: Diagnosis not present

## 2020-11-12 DIAGNOSIS — K8500 Idiopathic acute pancreatitis without necrosis or infection: Secondary | ICD-10-CM | POA: Diagnosis not present

## 2020-11-12 DIAGNOSIS — R2689 Other abnormalities of gait and mobility: Secondary | ICD-10-CM | POA: Diagnosis not present

## 2020-11-13 DIAGNOSIS — R2681 Unsteadiness on feet: Secondary | ICD-10-CM | POA: Diagnosis not present

## 2020-11-13 DIAGNOSIS — M6281 Muscle weakness (generalized): Secondary | ICD-10-CM | POA: Diagnosis not present

## 2020-11-13 DIAGNOSIS — R2689 Other abnormalities of gait and mobility: Secondary | ICD-10-CM | POA: Diagnosis not present

## 2020-11-13 DIAGNOSIS — K8500 Idiopathic acute pancreatitis without necrosis or infection: Secondary | ICD-10-CM | POA: Diagnosis not present

## 2020-11-13 DIAGNOSIS — R278 Other lack of coordination: Secondary | ICD-10-CM | POA: Diagnosis not present

## 2020-11-20 DIAGNOSIS — R278 Other lack of coordination: Secondary | ICD-10-CM | POA: Diagnosis not present

## 2020-11-20 DIAGNOSIS — R2681 Unsteadiness on feet: Secondary | ICD-10-CM | POA: Diagnosis not present

## 2020-11-20 DIAGNOSIS — K8500 Idiopathic acute pancreatitis without necrosis or infection: Secondary | ICD-10-CM | POA: Diagnosis not present

## 2020-11-20 DIAGNOSIS — R2689 Other abnormalities of gait and mobility: Secondary | ICD-10-CM | POA: Diagnosis not present

## 2020-11-20 DIAGNOSIS — M6281 Muscle weakness (generalized): Secondary | ICD-10-CM | POA: Diagnosis not present

## 2020-11-26 DIAGNOSIS — K8500 Idiopathic acute pancreatitis without necrosis or infection: Secondary | ICD-10-CM | POA: Diagnosis not present

## 2020-11-26 DIAGNOSIS — R278 Other lack of coordination: Secondary | ICD-10-CM | POA: Diagnosis not present

## 2020-11-26 DIAGNOSIS — R2689 Other abnormalities of gait and mobility: Secondary | ICD-10-CM | POA: Diagnosis not present

## 2020-11-26 DIAGNOSIS — M6281 Muscle weakness (generalized): Secondary | ICD-10-CM | POA: Diagnosis not present

## 2020-11-26 DIAGNOSIS — R2681 Unsteadiness on feet: Secondary | ICD-10-CM | POA: Diagnosis not present

## 2020-11-27 ENCOUNTER — Ambulatory Visit: Payer: Medicare PPO | Admitting: Family Medicine

## 2020-11-28 ENCOUNTER — Other Ambulatory Visit: Payer: Self-pay | Admitting: Family Medicine

## 2020-12-16 ENCOUNTER — Telehealth: Payer: Self-pay

## 2020-12-16 DIAGNOSIS — I10 Essential (primary) hypertension: Secondary | ICD-10-CM | POA: Diagnosis not present

## 2020-12-16 NOTE — Telephone Encounter (Signed)
Spoke with pt and offered appt slot for Friday. Pt agreed and appt scheduled. She did mention she took hydralazine a little sooner than she should since she takes 3 times daily and she had also taken alprazolam pill. Most recent BP was in the 120's. Advised to continue checking BP and HR and bring for review during appt. Pt voiced understanding.

## 2020-12-16 NOTE — Telephone Encounter (Signed)
yes

## 2020-12-16 NOTE — Telephone Encounter (Signed)
Patient stated she rec'd COVID booster vaccine on Friday 12/11/20.  She states that she checks her blood pressure on a regular basis, however, since she rec'd her booster on Friday, her blood pressure readings have been higher than normal. When I asked her to give me some of her readings, she gave me "the highest number was 167/83 and the lowest is 138/80.  I only look at the top number, so I dont look at the bottom number, because that is not the important one."  I told patient she should be writing down both numbers, top and bottom.   She states she is requesting to have an appt with Dr. Anitra Lauth this week because she does not want to have stroke.  Please call patient [phone].

## 2020-12-16 NOTE — Telephone Encounter (Signed)
Okay for 4pm slot Friday?

## 2020-12-18 ENCOUNTER — Other Ambulatory Visit: Payer: Self-pay

## 2020-12-18 ENCOUNTER — Ambulatory Visit: Payer: Medicare PPO | Admitting: Family Medicine

## 2020-12-18 ENCOUNTER — Encounter: Payer: Self-pay | Admitting: Family Medicine

## 2020-12-18 VITALS — BP 140/80 | HR 67 | Temp 97.4°F | Ht 61.0 in | Wt 170.6 lb

## 2020-12-18 DIAGNOSIS — I10 Essential (primary) hypertension: Secondary | ICD-10-CM

## 2020-12-18 MED ORDER — AMLODIPINE BESYLATE 5 MG PO TABS: ORAL_TABLET | ORAL | 1 refills | Status: DC

## 2020-12-18 NOTE — Patient Instructions (Signed)
Increase your amlodipine to 1 and 1/2 of the 40m tabs once a day

## 2020-12-18 NOTE — Progress Notes (Signed)
OFFICE VISIT  12/18/2020  CC:  Chief Complaint  Patient presents with   Follow-up    Blood pressure   HPI:    Patient is a 85 y.o. female who presents for f/u HTN. I last saw her 7 wks ago. A/P as of that visit: "#1: Acute pancreatitis, idiopathic, resolved appropriately in hospital with bowel rest and pain medication.  She does have a pseudocyst and will be getting follow-up in 6 months with gastroenterology.  ABD/GI symptoms at this time. Checking liver panel today.   #2: Chronic renal insufficiency III: She avoids NSAIDs and hydrates well.  She did get dye for her CT scan in the hospital but had no bump in her creatinine at all.  Electrolytes and creatinine level today.   #3 diastolic dysfunction.  Had a little bit of fluid overload when at the cardiologist weeks ago.  She got a few days of increased Lasix dosing.  Lower extremity edema seems at baseline and she is completely without any shortness of breath or dyspnea on exertion. Continue Lasix 40 mg/day  #4 Paroxysmal atrial fibrillation: Normal rhythm today.  Continue Lopressor 100 twice daily and Xarelto. Checking electrolytes today.  Hemoglobin stable at 11.7 on labs 1 month ago.   #5: Hypertension, well controlled on amlodipine 5 mg daily hydralazine 50 mg 3 times daily and Lopressor 100 mg twice daily"  INTERIM HX: A couple of days ago Aryannah noted onset of a bandlike tightening type of headache that she usually gets when her blood pressure is elevated.  She checked her blood pressure at that time it was 315 systolic over 83 diastolic.  She had the nurse practitioner at [facility] check it and it was in the 176H systolic.  She was examined and given some Tylenol and relaxed a little and it went down to the 140s.  Yesterday blood pressures consistently in the 130s over 70s. Leilyn is quite anxious about her blood pressure and feels frequently afraid that she is going to have a stroke.  She denies chest pain, shortness of  breath, dizziness, focal weakness, or palpitations. She admits to eating a lot of high salt foods over the holidays and has had increased lower extremity swelling.  She has had no acute illness, fever, or malaise.   Past Medical History:  Diagnosis Date   Abnormal EKG approx 2008   Nuclear stress test neg;    Acute pancreatitis 09/2020   idiopathic.  +panc psudocyst   Anxiety    with panic   CAP (community acquired pneumonia) 08/2017   Hospitalization for CAP/acute diast HF/rapid a-fib   Cataract    s/p surgery--lens implants   Chronic diastolic heart failure ([place]) 2019   Chronic renal insufficiency, stage 3 (moderate) ([place]) 12/04/2012   Renal u/s when in hosp 08/2017 for CAP/CHF showed symmetric kidneys, echogenicity normal, w/out hydronephrosis. Baseline GFR around 40 ml/min as of 10/2018.   DDD (degenerative disc disease), lumbar    Diverticulosis 2009   Fracture of radial shaft, left, closed 11/16/2010   fell down flight of stairs   History of kidney stones    Hx of adenomatous colonic polyps 2002;2009;2015   surveillance colonoscopy 2009, +polypectomy done-tubular adenoma w/out high grade.  05/2013 tubular adenomas--recall 3 yrs   Hyperlipidemia    Hypertension    Low TSH level 02/18/2016   T3 norm, T4 mildly elevated--suspected sick euthyroid syndrome.  Repeat labs 06/2016: normal.   Melanoma in situ ([place]) 06/2018   L LL   Osteoarthritis  of both knees    viscosupplementation injections helpful 2020/21   Osteopenia    DEXA 08/2010; repeat DEXA 02/2015 worse: fosamax started.  06/2018 Dexa T score -2.4.  2020 maj osteop fx risk = 24%, Hip fx risk 7.3%. 09/2020 T score -2.1   PAF (paroxysmal atrial fibrillation) ([place]) 02/2016   when in post-op for ankle surgery; spontaneously converted in hosp, seen by Dr. Debara Pickett in consultation--metoprolol rate control + xarelto recommended.  Metop d/c due to hypot.  Plan to cont xarelto 20 mg qd indef due to CHAD-VASc score of 3.  A-fib w/RVR and  CHF 07/2017; pt placed on amiodarone and plan for CV, but pt was in sinus rhythm when she went in for her DC CV, so she was sent home.   Peripheral edema    Pneumonia 2015   hx with sepsis   Rheumatic fever     Past Surgical History:  Procedure Laterality Date   APPENDECTOMY  1966   done during surgery for tubal pregnancy   CATARACT EXTRACTION W/ INTRAOCULAR LENS IMPLANT  2013   bilat   COLONOSCOPY W/ POLYPECTOMY  05/2013   +diverticulosis; recall 3 yrs (Dr. Henrene Pastor)   DEXA  02/2015; 06/2018   T score -2.1 in both femoral necks; FRAX 10 yr risk of major osteoporotic fracture was 21%---fosamax started. 06/2018 T score -2.4.  T score 09/2020 -2.1. Rpt 2 yrs.   ECTOPIC PREGNANCY SURGERY     EYE SURGERY     LEFT HEART CATH AND CORONARY ANGIOGRAPHY N/A 09/01/2017   No angiographically significant CAD.  Upper normal left ventricular filling pressure.  Procedure: LEFT HEART CATH AND CORONARY ANGIOGRAPHY;  Surgeon: Nelva Bush, MD;  Location: [facility];  Service: Cardiovascular;  Laterality: N/A;   LUMBAR LAMINECTOMY/DECOMPRESSION MICRODISCECTOMY N/A 09/26/2018   Procedure: Decompressive Lumbar Laminectomy L5 S1 FORAMINOTOMY L5 S1  NERVE ROOT BILATERALLY and Microdiscectomy L5-S1 Left;  Surgeon: Latanya Maudlin, MD;  Location: [facility];  Service: Orthopedics;  Laterality: N/A;  188mn   OPEN REDUCTION INTERNAL FIXATION (ORIF) TIBIA/FIBULA FRACTURE Left 02/18/2016   Procedure: OPEN REDUCTION INTERNAL FIXATION (ORIF) Right ankle trimalleolar fracture;  Surgeon: JWylene Simmer MD;  Location: [facility]  Service: Orthopedics;  Laterality: Left;  requests 935ms   ORIF RADIAL FRACTURE  11/18/2010   left; s/p slip on slippery floor and fell   THORACENTESIS  08/2017   diagnostic and therapeutic.  Transudative.  Clx neg.  (+pulm edema/diastolic HF)   TONSILLECTOMY     TRANSTHORACIC ECHOCARDIOGRAM  02/18/2016; 08/10/17   LVEF of 55-60%, mild AI and mild MR and normal biatrial size.  07/2017--normal LV  function, mild enlarge aortic root, mild/mod TR, bilat atrial enlargement.    Outpatient Medications Prior to Visit  Medication Sig Dispense Refill   acetaminophen (TYLENOL) 650 MG CR tablet Take 650 mg by mouth in the morning and at bedtime.     albuterol (VENTOLIN HFA) 108 (90 Base) MCG/ACT inhaler INHALE TWO PUFFS BY MOUTH into lungs EVERY 4 HOURS AS NEEDED FOR WHEEZING OR SHORTNESS OF BREATH 8.5 g 1   Apoaequorin (PREVAGEN PO) Take 1 tablet by mouth daily.     atorvastatin (LIPITOR) 40 MG tablet Take 1 tablet (40 mg total) by mouth daily. 90 tablet 3   Biotin 5000 MCG TABS Take 5,000 mcg by mouth daily.     Calcium Carbonate (CALCIUM 600 PO) Take 1,200 mg by mouth daily.      carboxymethylcellulose (REFRESH PLUS) 0.5 % SOLN Place 2 drops into  both eyes daily as needed (dry/irritated eyes.).     Coenzyme Q10 200 MG capsule Take 200 mg by mouth daily.     fluticasone (FLONASE) 50 MCG/ACT nasal spray Place 2 sprays into both nostrils daily.      furosemide (LASIX) 40 MG tablet Take 1 tablet (40 mg total) by mouth daily. 90 tablet 3   hydrALAZINE (APRESOLINE) 50 MG tablet Take 1 tablet (50 mg total) by mouth 3 (three) times daily. 270 tablet 3   metoprolol tartrate (LOPRESSOR) 100 MG tablet TAKE 1 TABLET BY MOUTH 2 TIMES DAILY 180 tablet 0   Multiple Vitamins-Minerals (AIRBORNE PO) Take 1 tablet by mouth daily as needed (immune support).     Multiple Vitamins-Minerals (CENTRUM SILVER ULTRA WOMENS) TABS Take 1 tablet by mouth every evening.     potassium chloride SA (KLOR-CON) 20 MEQ tablet TAKE 1 TABLET BY MOUTH 2 TIMES DAILY 180 tablet 3   Rivaroxaban (XARELTO) 15 MG TABS tablet Take 1 tablet (15 mg total) by mouth daily at 12 noon. 90 tablet 1   amLODipine (NORVASC) 5 MG tablet TAKE 1 TABLET BY MOUTH EVERY DAY 90 tablet 0   No facility-administered medications prior to visit.    Allergies  Allergen Reactions   Augmentin [Amoxicillin-Pot Clavulanate] Nausea And Vomiting and Other (See  Comments)    "projectile vomiting" Has patient had a PCN reaction causing immediate rash, facial/tongue/throat swelling, SOB or lightheadedness with hypotension:No Has patient had a PCN reaction causing severe rash involving mucus membranes or skin necrosis:No Has patient had a PCN reaction that required hospitalization:No Has patient had a PCN reaction occurring within the last 10 years:Yes If all of the above answers are "NO", then may proceed with Cephalosporin use.    Amoxicillin Rash   Clindamycin/Lincomycin Rash    ROS As per HPI  PE: Vitals with BMI 12/18/2020 10/28/2020 10/28/2020  Height 5' 1"  5' 1"  5' 1"   Weight 170 lbs 10 oz 166 lbs 13 oz 167 lbs  BMI 32.25 40.81 44.81  Systolic 856 314 970  Diastolic 80 65 68  Pulse 67 65 64     General: Alert and well-appearing. Affect is pleasant, slightly nervous as usual. Cardiovascular: Regular rhythm and rate without murmur rub or gallop. Lungs are clear to auscultation bilaterally, breathing nonlabored. She has 3+ pitting edema on the right lower leg and just slightly less on the left lower leg.  She is wearing compression stockings  LABS:    Chemistry      Component Value Date/Time   NA 144 10/28/2020 1331   NA 140 09/24/2020 1119   K 3.9 10/28/2020 1331   CL 105 10/28/2020 1331   CO2 32 10/28/2020 1331   BUN 21 10/28/2020 1331   BUN 13 09/24/2020 1119   CREATININE 1.14 10/28/2020 1331   CREATININE 1.23 (H) 10/23/2017 0938      Component Value Date/Time   CALCIUM 9.8 10/28/2020 1331   ALKPHOS 88 10/28/2020 1331   AST 20 10/28/2020 1331   ALT 15 10/28/2020 1331   BILITOT 0.5 10/28/2020 1331   BILITOT 0.3 09/24/2020 1119      IMPRESSION AND PLAN:  #1 uncontrolled hypertension.  For the most part just mild elevations.  She is quite anxious about her blood pressure.  Sometimes this adds to her BP elevation.  Additionally, has had significant increase in sodium intake lately.  After discussion we decided to  increase her amlodipine a little bit: Increase to 1-1/2 of the 5 mg tabs  once a day.  Continue hydralazine 50 mg 3 times daily and Lopressor 100 mg twice a day.  Check blood pressure once a day and we will review these back in the office in 10 to 14 days.  An After Visit Summary was printed and given to the patient.  FOLLOW UP: Return for 10-14d f/u HTN. Has appt 03/02/21 for cpe  Signed:  Crissie Sickles, MD           12/18/2020

## 2020-12-30 ENCOUNTER — Ambulatory Visit: Payer: Medicare PPO | Admitting: Family Medicine

## 2020-12-30 ENCOUNTER — Other Ambulatory Visit: Payer: Self-pay

## 2020-12-30 ENCOUNTER — Encounter: Payer: Self-pay | Admitting: Family Medicine

## 2020-12-30 VITALS — BP 140/82 | HR 66 | Temp 97.5°F | Ht 61.0 in | Wt 168.8 lb

## 2020-12-30 DIAGNOSIS — I10 Essential (primary) hypertension: Secondary | ICD-10-CM

## 2020-12-30 MED ORDER — AMLODIPINE BESYLATE 10 MG PO TABS: 10.0000 mg | ORAL_TABLET | Freq: Every day | ORAL | 0 refills | Status: DC

## 2020-12-30 NOTE — Progress Notes (Signed)
OFFICE VISIT  12/30/2020  CC:  Chief Complaint  Patient presents with   Follow-up    RCI; pt is not fasting   HPI:    Patient is a 85 y.o. female who presents for 12 day f/u uncontrolled HTN. A/P as of last visit: "#1 uncontrolled hypertension.  For the most part just mild elevations.  She is quite anxious about her blood pressure.  Sometimes this adds to her BP elevation.  Additionally, has had significant increase in sodium intake lately.  After discussion we decided to increase her amlodipine a little bit: Increase to 1-1/2 of the 5 mg tabs once a day.  Continue hydralazine 50 mg 3 times daily and Lopressor 100 mg twice a day.  Check blood pressure once a day and we will review these back in the office in 10 to 14 days."  INTERIM HX: Shaquille feels well. Home blood pressure measurements reviewed.  Range 924-268 systolic over 34-19 diastolic.  No heart rate data.  She took her blood pressure 12 times today: Range 622-297 systolic and 98-92 diastolic.   Past Medical History:  Diagnosis Date   Abnormal EKG approx 2008   Nuclear stress test neg;    Acute pancreatitis 09/2020   idiopathic.  +panc psudocyst   Anxiety    with panic   CAP (community acquired pneumonia) 08/2017   Hospitalization for CAP/acute diast HF/rapid a-fib   Cataract    s/p surgery--lens implants   Chronic diastolic heart failure ([place]) 2019   Chronic renal insufficiency, stage 3 (moderate) ([place]) 12/04/2012   Renal u/s when in hosp 08/2017 for CAP/CHF showed symmetric kidneys, echogenicity normal, w/out hydronephrosis. Baseline GFR around 40 ml/min as of 10/2018.   DDD (degenerative disc disease), lumbar    Diverticulosis 2009   Fracture of radial shaft, left, closed 11/16/2010   fell down flight of stairs   History of kidney stones    Hx of adenomatous colonic polyps 2002;2009;2015   surveillance colonoscopy 2009, +polypectomy done-tubular adenoma w/out high grade.  05/2013 tubular adenomas--recall 3 yrs    Hyperlipidemia    Hypertension    Low TSH level 02/18/2016   T3 norm, T4 mildly elevated--suspected sick euthyroid syndrome.  Repeat labs 06/2016: normal.   Melanoma in situ ([place]) 06/2018   L LL   Osteoarthritis of both knees    viscosupplementation injections helpful 2020/21   Osteopenia    DEXA 08/2010; repeat DEXA 02/2015 worse: fosamax started.  06/2018 Dexa T score -2.4.  2020 maj osteop fx risk = 24%, Hip fx risk 7.3%. 09/2020 T score -2.1   PAF (paroxysmal atrial fibrillation) ([place]) 02/2016   when in post-op for ankle surgery; spontaneously converted in hosp, seen by Dr. Debara Pickett in consultation--metoprolol rate control + xarelto recommended.  Metop d/c due to hypot.  Plan to cont xarelto 20 mg qd indef due to CHAD-VASc score of 3.  A-fib w/RVR and CHF 07/2017; pt placed on amiodarone and plan for CV, but pt was in sinus rhythm when she went in for her DC CV, so she was sent home.   Peripheral edema    Pneumonia 2015   hx with sepsis   Rheumatic fever     Past Surgical History:  Procedure Laterality Date   APPENDECTOMY  1966   done during surgery for tubal pregnancy   CATARACT EXTRACTION W/ INTRAOCULAR LENS IMPLANT  2013   bilat   COLONOSCOPY W/ POLYPECTOMY  05/2013   +diverticulosis; recall 3 yrs (Dr. Henrene Pastor)   DEXA  02/2015; 06/2018   T score -2.1 in both femoral necks; FRAX 10 yr risk of major osteoporotic fracture was 21%---fosamax started. 06/2018 T score -2.4.  T score 09/2020 -2.1. Rpt 2 yrs.   ECTOPIC PREGNANCY SURGERY     EYE SURGERY     LEFT HEART CATH AND CORONARY ANGIOGRAPHY N/A 09/01/2017   No angiographically significant CAD.  Upper normal left ventricular filling pressure.  Procedure: LEFT HEART CATH AND CORONARY ANGIOGRAPHY;  Surgeon: Nelva Bush, MD;  Location: [facility];  Service: Cardiovascular;  Laterality: N/A;   LUMBAR LAMINECTOMY/DECOMPRESSION MICRODISCECTOMY N/A 09/26/2018   Procedure: Decompressive Lumbar Laminectomy L5 S1 FORAMINOTOMY L5 S1  NERVE  ROOT BILATERALLY and Microdiscectomy L5-S1 Left;  Surgeon: Latanya Maudlin, MD;  Location: [facility];  Service: Orthopedics;  Laterality: N/A;  173mn   OPEN REDUCTION INTERNAL FIXATION (ORIF) TIBIA/FIBULA FRACTURE Left 02/18/2016   Procedure: OPEN REDUCTION INTERNAL FIXATION (ORIF) Right ankle trimalleolar fracture;  Surgeon: JWylene Simmer MD;  [facility]  Service: Orthopedics;  Laterality: Left;  requests 910ms   ORIF RADIAL FRACTURE  11/18/2010   left; s/p slip on slippery floor and fell   THORACENTESIS  08/2017   diagnostic and therapeutic.  Transudative.  Clx neg.  (+pulm edema/diastolic HF)   TONSILLECTOMY     TRANSTHORACIC ECHOCARDIOGRAM  02/18/2016; 08/10/17   LVEF of 55-60%, mild AI and mild MR and normal biatrial size.  07/2017--normal LV function, mild enlarge aortic root, mild/mod TR, bilat atrial enlargement.    Outpatient Medications Prior to Visit  Medication Sig Dispense Refill   acetaminophen (TYLENOL) 650 MG CR tablet Take 650 mg by mouth in the morning and at bedtime.     albuterol (VENTOLIN HFA) 108 (90 Base) MCG/ACT inhaler INHALE TWO PUFFS BY MOUTH into lungs EVERY 4 HOURS AS NEEDED FOR WHEEZING OR SHORTNESS OF BREATH 8.5 g 1   Apoaequorin (PREVAGEN PO) Take 1 tablet by mouth daily.     atorvastatin (LIPITOR) 40 MG tablet Take 1 tablet (40 mg total) by mouth daily. 90 tablet 3   Biotin 5000 MCG TABS Take 5,000 mcg by mouth daily.     Calcium Carbonate (CALCIUM 600 PO) Take 1,200 mg by mouth daily.      carboxymethylcellulose (REFRESH PLUS) 0.5 % SOLN Place 2 drops into both eyes daily as needed (dry/irritated eyes.).     Coenzyme Q10 200 MG capsule Take 200 mg by mouth daily.     fluticasone (FLONASE) 50 MCG/ACT nasal spray Place 2 sprays into both nostrils daily.      furosemide (LASIX) 40 MG tablet Take 1 tablet (40 mg total) by mouth daily. 90 tablet 3   hydrALAZINE (APRESOLINE) 50 MG tablet Take 1 tablet (50 mg total) by mouth 3 (three) times daily. 270 tablet 3    metoprolol tartrate (LOPRESSOR) 100 MG tablet TAKE 1 TABLET BY MOUTH 2 TIMES DAILY 180 tablet 0   Multiple Vitamins-Minerals (AIRBORNE PO) Take 1 tablet by mouth daily as needed (immune support).     Multiple Vitamins-Minerals (CENTRUM SILVER ULTRA WOMENS) TABS Take 1 tablet by mouth every evening.     potassium chloride SA (KLOR-CON) 20 MEQ tablet TAKE 1 TABLET BY MOUTH 2 TIMES DAILY 180 tablet 3   Rivaroxaban (XARELTO) 15 MG TABS tablet Take 1 tablet (15 mg total) by mouth daily at 12 noon. 90 tablet 1   amLODipine (NORVASC) 5 MG tablet 1 and 1/2 tabs po qd 45 tablet 1   No facility-administered medications prior to visit.  Allergies  Allergen Reactions   Augmentin [Amoxicillin-Pot Clavulanate] Nausea And Vomiting and Other (See Comments)    "projectile vomiting" Has patient had a PCN reaction causing immediate rash, facial/tongue/throat swelling, SOB or lightheadedness with hypotension:No Has patient had a PCN reaction causing severe rash involving mucus membranes or skin necrosis:No Has patient had a PCN reaction that required hospitalization:No Has patient had a PCN reaction occurring within the last 10 years:Yes If all of the above answers are "NO", then may proceed with Cephalosporin use.    Amoxicillin Rash   Clindamycin/Lincomycin Rash    ROS As per HPI  PE: Vitals with BMI 12/30/2020 12/18/2020 10/28/2020  Height _0  _1  _2   Weight 168 lbs 13 oz 170 lbs 10 oz 166 lbs 13 oz  BMI 31.91 33.82 50.53  Systolic 976 734 193  Diastolic 82 80 65  Pulse 66 67 65     Physical Exam Vitals with BMI 12/30/2020 12/18/2020 10/28/2020  Height _3  _4  _5   Weight 168 lbs 13 oz 170 lbs 10 oz 166 lbs 13 oz  BMI 31.91 79.02 40.97  Systolic 353 299 242  Diastolic 82 80 65  Pulse 66 67 65    Gen: Alert, well appearing.  Patient is oriented to person, place, time, and situation. AFFECT: pleasant, lucid thought and speech. CV: RRR, no m/r/g.   LUNGS: CTA bilat,  nonlabored resps, good aeration in all lung fields. EXT: no clubbing or cyanosis.  2+ bilat LL pitting edema.    LABS:  Last metabolic panel Lab Results  Component Value Date   GLUCOSE 132 (H) 10/28/2020   NA 144 10/28/2020   K 3.9 10/28/2020   CL 105 10/28/2020   CO2 32 10/28/2020   BUN 21 10/28/2020   CREATININE 1.14 10/28/2020   EGFR 67 09/24/2020   CALCIUM 9.8 10/28/2020   PHOS 2.3 (L) 09/18/2020   PROT 6.6 10/28/2020   ALBUMIN 3.9 10/28/2020   LABGLOB 2.4 09/24/2020   AGRATIO 1.5 09/24/2020   BILITOT 0.5 10/28/2020   ALKPHOS 88 10/28/2020   AST 20 10/28/2020   ALT 15 10/28/2020   ANIONGAP 6 09/18/2020    IMPRESSION AND PLAN:  Uncontrolled hypertension.  Improving some.  Increase amlodipine to 10 mg/day.  Continue home BP monitoring.  An After Visit Summary was printed and given to the patient.  FOLLOW UP: Return in about 3 weeks (around 01/20/2021) for f/u HTN. CPE scheduled 03/02/21  Signed:  Crissie Sickles, MD           12/30/2020

## 2021-01-02 ENCOUNTER — Other Ambulatory Visit: Payer: Self-pay | Admitting: Family Medicine

## 2021-01-05 ENCOUNTER — Other Ambulatory Visit: Payer: Self-pay | Admitting: Family Medicine

## 2021-01-05 NOTE — Telephone Encounter (Signed)
Requesting: alprazolam Contract: 07/10/19 UDS: N/A Last Visit: 12/30/20 Next Visit: 03/02/21 Last Refill: 07/13/20(90,0)   Please Advise. Med pending

## 2021-01-05 NOTE — Telephone Encounter (Signed)
Patient refill request.  ALPRAZolam (XANAX) 0.5 MG tablet

## 2021-01-06 NOTE — Telephone Encounter (Signed)
LM for pt regarding med refill

## 2021-01-12 NOTE — Progress Notes (Deleted)
Cardiology Office Note:    Date:  01/12/2021   ID:  Brenda Matthews, DOB 1935-08-20, MRN [id number]  PCP:  [name], MD  Cardiologist:  [name], MD  Electrophysiologist:  None   Referring MD: [name], MD   Chief Complaint: follow-up of atrial fibrillation and CHF  History of Present Illness:    Brenda Matthews is a 85 y.o. female with a history of normal coronaries on cardiac catheterization in 08/2017, paroxysmal atrial fibrillation, chronic diastolic CHF, hypertension, hyperlipidemia, hyperthyroidism, and CKD stage III who is followed by  Dr. Debara Pickett  and presents today for routine follow-up.   Patient was first seen by Dr. Debara Pickett in 02/2016 for new onset of atrial fibrillation with RVR after induction with anesthesia during surgery for ankle fracture. Echo showed normal LV function. She was initially started on low dose beta-blocker and Xarelto. She was ultimately taken off beta-blocker due to low BP but thankfully converted back to sinus rhythm on her own. She was advised to continue Xarelto given CHA2DS2-VASc score of 3. She had a recurrent episode of atrial fibrillation with RVR in 07/2017. Echo showed LVEF of 50-55% with normal wall motion, biatrial enlargement, mild MR, and mild to moderate TR. She was started on Amiodarone and outpatient DCCV was arranged. However, when she presented for procedure, she was back in sinus rhythm. Patient was admitted in 08/2017 with acute on chronic hypoxic respiratory failure  secondary to pneumonia with sepsis and acute diastolic CHF. She was noted to be back in atrial fibrillation with RVR. Troponin was elevated so she underwent a cardiac catheterization which showed normal coronaries. She was diuresed and treated with antibiotics for her pneumonia. At follow-up visit, she was noted to be back in sinus rhythm. She has had no recurrence of atrial fibrillation since that time so Amiodarone was stopped in 05/2019.   Patient was last seen by me  in 09/2020 at which time she was doing relatively well from a cardiac standpoint. She did report worsening lower extremity edema after receiving a lot of IV fluids during recent hospitalization for pancreatitis. She also noted occasional palpitations lasting for less than 5 minutes at time. She was maintain sinus rhythm at that visit. She was noted to have significant lower extremity edema on exam Pro-BNP was ordered and was elevated at 2,759. Lasix was increased to twice daily for 5 days. Xarelto was also increased to 44m daily given improvement in renal function.  Patient presents today for follow-up. ***  Paroxysmal Atrial Fibrillation Previously on Amiodarone but this was stopped in 05/2019 as patient had no documented recurrence of atrial fibrillation since 2019.  - Maintaining sinus rhythm on exam. - Continue Lopressor 1057mtwice daily.  - Continue Xarelto 2074maily. ***   Chronic Diastolic CHF Lower Extremity Edema *** Echo in 07/2017 showed LVEF of 50-55% with normal wall motion, biatrial enlargement, mild MR, and mild to moderate TR. - *** - Continue Lasix 60m10mily. Also on KCl 20 MEq twice daily with this. - Discussed importance of daily weights and sodium/fluid restrictions.   Mild Mitral Regurgitation Mild to Moderate Tricuspid Regurgitation Noted on Echo in 07/2017. - Will order repeat Echo for routine monitoring of this. ***   Hypertension BP well controlled.  - Continue current medications: Amlodipine 5mg 38mly, Lopressor 100mg 55me daily, Hydralazine 50mg t29m times daily.    Hyperlipidemia Lipid panel on 08/27/2020: Total Cholesterol 126, Triglycerides 152, HDL 42.60, LDL 53. - Continue Lipitor 60mg da85m  CKD Stage III Baseline creatinine 1.1 to 1.6. Stable at 1.14 on most labs in 10/2020.  Past Medical History:  Diagnosis Date   Abnormal EKG approx 2008   Nuclear stress test neg;    Acute pancreatitis 09/2020   idiopathic.  +panc psudocyst   Anxiety     with panic   CAP (community acquired pneumonia) 08/2017   Hospitalization for CAP/acute diast HF/rapid a-fib   Cataract    s/p surgery--lens implants   Chronic diastolic heart failure ([place]) 2019   Chronic renal insufficiency, stage 3 (moderate) ([place]) 12/04/2012   Renal u/s when in hosp 08/2017 for CAP/CHF showed symmetric kidneys, echogenicity normal, w/out hydronephrosis. Baseline GFR around 40 ml/min as of 10/2018.   DDD (degenerative disc disease), lumbar    Diverticulosis 2009   Fracture of radial shaft, left, closed 11/16/2010   fell down flight of stairs   History of kidney stones    Hx of adenomatous colonic polyps 2002;2009;2015   surveillance colonoscopy 2009, +polypectomy done-tubular adenoma w/out high grade.  05/2013 tubular adenomas--recall 3 yrs   Hyperlipidemia    Hypertension    Low TSH level 02/18/2016   T3 norm, T4 mildly elevated--suspected sick euthyroid syndrome.  Repeat labs 06/2016: normal.   Melanoma in situ ([place]) 06/2018   L LL   Osteoarthritis of both knees    viscosupplementation injections helpful 2020/21   Osteopenia    DEXA 08/2010; repeat DEXA 02/2015 worse: fosamax started.  06/2018 Dexa T score -2.4.  2020 maj osteop fx risk = 24%, Hip fx risk 7.3%. 09/2020 T score -2.1   PAF (paroxysmal atrial fibrillation) ([place]) 02/2016   when in post-op for ankle surgery; spontaneously converted in hosp, seen by Dr. Debara Pickett in consultation--metoprolol rate control + xarelto recommended.  Metop d/c due to hypot.  Plan to cont xarelto 20 mg qd indef due to CHAD-VASc score of 3.  A-fib w/RVR and CHF 07/2017; pt placed on amiodarone and plan for CV, but pt was in sinus rhythm when she went in for her DC CV, so she was sent home.   Peripheral edema    Pneumonia 2015   hx with sepsis   Rheumatic fever     Past Surgical History:  Procedure Laterality Date   APPENDECTOMY  1966   done during surgery for tubal pregnancy   CATARACT EXTRACTION W/ INTRAOCULAR LENS IMPLANT  2013    bilat   COLONOSCOPY W/ POLYPECTOMY  05/2013   +diverticulosis; recall 3 yrs (Dr. Henrene Pastor)   DEXA  02/2015; 06/2018   T score -2.1 in both femoral necks; FRAX 10 yr risk of major osteoporotic fracture was 21%---fosamax started. 06/2018 T score -2.4.  T score 09/2020 -2.1. Rpt 2 yrs.   ECTOPIC PREGNANCY SURGERY     EYE SURGERY     LEFT HEART CATH AND CORONARY ANGIOGRAPHY N/A 09/01/2017   No angiographically significant CAD.  Upper normal left ventricular filling pressure.  Procedure: LEFT HEART CATH AND CORONARY ANGIOGRAPHY;  Surgeon: Nelva Bush, MD;  Location: [facility];  Service: Cardiovascular;  Laterality: N/A;   LUMBAR LAMINECTOMY/DECOMPRESSION MICRODISCECTOMY N/A 09/26/2018   Procedure: Decompressive Lumbar Laminectomy L5 S1 FORAMINOTOMY L5 S1  NERVE ROOT BILATERALLY and Microdiscectomy L5-S1 Left;  Surgeon: Latanya Maudlin, MD;  Location: [facility];  Service: Orthopedics;  Laterality: N/A;  155mn   OPEN REDUCTION INTERNAL FIXATION (ORIF) TIBIA/FIBULA FRACTURE Left 02/18/2016   Procedure: OPEN REDUCTION INTERNAL FIXATION (ORIF) Right ankle trimalleolar fracture;  Surgeon: JWylene Simmer MD;  Location:  [facility];  Service: Orthopedics;  Laterality: Left;  requests 3mns   ORIF RADIAL FRACTURE  11/18/2010   left; s/p slip on slippery floor and fell   THORACENTESIS  08/2017   diagnostic and therapeutic.  Transudative.  Clx neg.  (+pulm edema/diastolic HF)   TONSILLECTOMY     TRANSTHORACIC ECHOCARDIOGRAM  02/18/2016; 08/10/17   LVEF of 55-60%, mild AI and mild MR and normal biatrial size.  07/2017--normal LV function, mild enlarge aortic root, mild/mod TR, bilat atrial enlargement.    Current Medications: No outpatient medications have been marked as taking for the 01/20/21 encounter (Appointment) with GDarreld Mclean PA-C.     Allergies:   Augmentin [amoxicillin-pot clavulanate], Amoxicillin, and Clindamycin/lincomycin   Social History   Socioeconomic History   Marital status:  Widowed    Spouse name: Not on file   Number of children: Not on file   Years of education: Not on file   Highest education level: Not on file  Occupational History   Not on file  Tobacco Use   Smoking status: Never   Smokeless tobacco: Never  Vaping Use   Vaping Use: Never used  Substance and Sexual Activity   Alcohol use: Yes    Comment: rarely   Drug use: Never   Sexual activity: Not on file  Other Topics Concern   Not on file  Social History Narrative   Widow, 2 sons.   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  Rare alcohol.   No drugs.  Exercise: 4 times per week, about 423m   Social Determinants of Health   Financial Resource Strain: Low Risk    Difficulty of Paying Living Expenses: Not hard at all  Food Insecurity: No Food Insecurity   Worried About [employer] fundrais[employer] the Last Year: Never true   [place] the Last Year: Never true  Transportation Needs: No Transportation Needs   Lack of Transportation (Medical): No   Lack of Transportation (Non-Medical): No  Physical Activity: Insufficiently Active   Days of Exercise per Week: 3 days   Minutes of Exercise per Session: 30 min  Stress: Not on file  Social Connections: Moderately Integrated   Frequency of Communication with Friends and Family: More than three times a week   Frequency of Social Gatherings with Friends and Family: More than three times a week   Attends Religious Services: More than 4 times per year   Active Member of [employer] Organizations: Yes   Attends [employer]iv[employer]ings: More than 4 times per year   Marital Status: Widowed     Family History: The patient's family history includes Diabetes in her sister; Heart disease in her father and mother; Hypertension in her brother. There is no history of Colon cancer, Pancreatic cancer, Rectal cancer, or Stomach cancer.  ROS:   Please see the history of present illness.     EKGs/Labs/Other Studies Reviewed:    The  following studies were reviewed today:  Echocardiogram 08/10/2017: Study Conclusions: - Left ventricle: The cavity size was normal. Wall thickness was increased in a pattern of mild LVH. Systolic function was normal. The estimated ejection fraction was in the range of 50% to 55%.  Wall motion was normal; there were no regional wall motion abnormalities.  - Aortic valve: There was trivial regurgitation.  - Aortic root: The aortic root was mildly dilated.  - Mitral valve: There was mild regurgitation.  - Left atrium: The atrium was severely  dilated.  - Right atrium: The atrium was mildly dilated.  - Tricuspid valve: There was mild-moderate regurgitation.  - Pulmonary arteries: PA peak pressure: 32 mm Hg (S).  - Pericardium, extracardiac: A trivial pericardial effusion was identified.   Impressions: - Normal LV systolic function; mild LVH; trace AI; mildly dilated aortic root; mild MR; biatrial enlargement; mild to moderate TR.  _______________   Left Cardiac Catheterization 09/01/2017: Conclusions: No angiographically significant coronary artery disease. Upper normal left ventricular filling pressure.   Recommendations: Continue medical therapy of diastolic heart failure and atrial fibrillation. Gentle post-catheterization hydration given resolved acute kidney injury. Restart heparin infusion 2 hours after TR band removal; rivaroxaban could be restarted as soon as tomorrow.   Recommend to resume Rivaroxaban, at currently prescribed dose and frequency, on 09/01/17.  Concurrent antiplatelet therapy not recommended.   EKG:  EKG not ordered today.   Recent Labs: 09/18/2020: Magnesium 2.0 09/24/2020: Hemoglobin 11.7; NT-Pro BNP 2,759; Platelets 460 10/28/2020: ALT 15; BUN 21; Creatinine, Ser 1.14; Potassium 3.9; Sodium 144  Recent Lipid Panel    Component Value Date/Time   CHOL 126 08/27/2020 0934   TRIG 152.0 (H) 08/27/2020 0934   HDL 42.60 08/27/2020 0934   CHOLHDL 3 08/27/2020 0934    VLDL 30.4 08/27/2020 0934   LDLCALC 53 08/27/2020 0934   LDLDIRECT 66.0 09/10/2018 0913    Physical Exam:    Vital Signs: LMP  (LMP Unknown)     Wt Readings from Last 3 Encounters:  12/30/20 168 lb 12.8 oz (76.6 kg)  12/18/20 170 lb 9.6 oz (77.4 kg)  10/28/20 166 lb 12.8 oz (75.7 kg)     General: 85 y.o. female in no acute distress. HEENT: Normocephalic and atraumatic. Sclera clear. EOMs intact. Neck: Supple. No carotid bruits. No JVD. Heart: *** RRR. Distinct S1 and S2. No murmurs, gallops, or rubs. Radial and distal pedal pulses 2+ and equal bilaterally. Lungs: No increased work of breathing. Clear to ausculation bilaterally. No wheezes, rhonchi, or rales.  Abdomen: Soft, non-distended, and non-tender to palpation. Bowel sounds present in all 4 quadrants.  MSK: Normal strength and tone for age. *** Extremities: No lower extremity edema.    Skin: Warm and dry. Neuro: Alert and oriented x3. No focal deficits. Psych: Normal affect. Responds appropriately.   Assessment:    No diagnosis found.  Plan:     Disposition: Follow up in ***   Medication Adjustments/Labs and Tests Ordered: Current medicines are reviewed at length with the patient today.  Concerns regarding medicines are outlined above.  No orders of the defined types were placed in this encounter.  No orders of the defined types were placed in this encounter.   There are no Patient Instructions on file for this visit.   Signed, Darreld Mclean, PA-C  01/12/2021 11:46 AM    [facility]

## 2021-01-19 ENCOUNTER — Other Ambulatory Visit: Payer: Self-pay | Admitting: Family Medicine

## 2021-01-20 ENCOUNTER — Ambulatory Visit: Payer: Medicare PPO | Admitting: Student

## 2021-01-22 ENCOUNTER — Ambulatory Visit: Payer: Medicare PPO | Admitting: Family Medicine

## 2021-01-29 ENCOUNTER — Ambulatory Visit: Payer: Medicare PPO | Admitting: Family Medicine

## 2021-01-29 NOTE — Progress Notes (Deleted)
OFFICE VISIT  01/29/2021  CC: No chief complaint on file.   HPI:    Patient is a 86 y.o. female who presents for 1 mo f/u uncontrolled HTN. A/P as of last visit: "Uncontrolled hypertension.  Improving some.  Increase amlodipine to 10 mg/day.  Continue hydralazine 50 mg 3 times daily and Lopressor 100 twice daily.   Continue home BP monitoring."  INTERIM HX: ***  Past Medical History:  Diagnosis Date   Abnormal EKG approx 2008   Nuclear stress test neg;    Acute pancreatitis 09/2020   idiopathic.  +panc psudocyst   Anxiety    with panic   CAP (community acquired pneumonia) 08/2017   Hospitalization for CAP/acute diast HF/rapid a-fib   Cataract    s/p surgery--lens implants   Chronic diastolic heart failure ([place]) 2019   Chronic renal insufficiency, stage 3 (moderate) ([place]) 12/04/2012   Renal u/s when in hosp 08/2017 for CAP/CHF showed symmetric kidneys, echogenicity normal, w/out hydronephrosis. Baseline GFR around 40 ml/min as of 10/2018.   DDD (degenerative disc disease), lumbar    Diverticulosis 2009   Fracture of radial shaft, left, closed 11/16/2010   fell down flight of stairs   History of kidney stones    Hx of adenomatous colonic polyps 2002;2009;2015   surveillance colonoscopy 2009, +polypectomy done-tubular adenoma w/out high grade.  05/2013 tubular adenomas--recall 3 yrs   Hyperlipidemia    Hypertension    Low TSH level 02/18/2016   T3 norm, T4 mildly elevated--suspected sick euthyroid syndrome.  Repeat labs 06/2016: normal.   Melanoma in situ ([place]) 06/2018   L LL   Osteoarthritis of both knees    viscosupplementation injections helpful 2020/21   Osteopenia    DEXA 08/2010; repeat DEXA 02/2015 worse: fosamax started.  06/2018 Dexa T score -2.4.  2020 maj osteop fx risk = 24%, Hip fx risk 7.3%. 09/2020 T score -2.1   PAF (paroxysmal atrial fibrillation) ([place]) 02/2016   when in post-op for ankle surgery; spontaneously converted in hosp, seen by Dr. Debara Pickett in  consultation--metoprolol rate control + xarelto recommended.  Metop d/c due to hypot.  Plan to cont xarelto 20 mg qd indef due to CHAD-VASc score of 3.  A-fib w/RVR and CHF 07/2017; pt placed on amiodarone and plan for CV, but pt was in sinus rhythm when she went in for her DC CV, so she was sent home.   Peripheral edema    Pneumonia 2015   hx with sepsis   Rheumatic fever     Past Surgical History:  Procedure Laterality Date   APPENDECTOMY  1966   done during surgery for tubal pregnancy   CATARACT EXTRACTION W/ INTRAOCULAR LENS IMPLANT  2013   bilat   COLONOSCOPY W/ POLYPECTOMY  05/2013   +diverticulosis; recall 3 yrs (Dr. Henrene Pastor)   DEXA  02/2015; 06/2018   T score -2.1 in both femoral necks; FRAX 10 yr risk of major osteoporotic fracture was 21%---fosamax started. 06/2018 T score -2.4.  T score 09/2020 -2.1. Rpt 2 yrs.   ECTOPIC PREGNANCY SURGERY     EYE SURGERY     LEFT HEART CATH AND CORONARY ANGIOGRAPHY N/A 09/01/2017   No angiographically significant CAD.  Upper normal left ventricular filling pressure.  Procedure: LEFT HEART CATH AND CORONARY ANGIOGRAPHY;  Surgeon: Nelva Bush, MD;  Location: [facility];  Service: Cardiovascular;  Laterality: N/A;   LUMBAR LAMINECTOMY/DECOMPRESSION MICRODISCECTOMY N/A 09/26/2018   Procedure: Decompressive Lumbar Laminectomy L5 S1 FORAMINOTOMY L5 S1  NERVE  ROOT BILATERALLY and Microdiscectomy L5-S1 Left;  Surgeon: Latanya Maudlin, MD;  Location: [facility];  Service: Orthopedics;  Laterality: N/A;  147mn   OPEN REDUCTION INTERNAL FIXATION (ORIF) TIBIA/FIBULA FRACTURE Left 02/18/2016   Procedure: OPEN REDUCTION INTERNAL FIXATION (ORIF) Right ankle trimalleolar fracture;  Surgeon: JWylene Simmer MD;  Location: [facility]  Service: Orthopedics;  Laterality: Left;  requests 919ms   ORIF RADIAL FRACTURE  11/18/2010   left; s/p slip on slippery floor and fell   THORACENTESIS  08/2017   diagnostic and therapeutic.  Transudative.  Clx neg.  (+pulm  edema/diastolic HF)   TONSILLECTOMY     TRANSTHORACIC ECHOCARDIOGRAM  02/18/2016; 08/10/17   LVEF of 55-60%, mild AI and mild MR and normal biatrial size.  07/2017--normal LV function, mild enlarge aortic root, mild/mod TR, bilat atrial enlargement.    Outpatient Medications Prior to Visit  Medication Sig Dispense Refill   acetaminophen (TYLENOL) 650 MG CR tablet Take 650 mg by mouth in the morning and at bedtime.     albuterol (VENTOLIN HFA) 108 (90 Base) MCG/ACT inhaler INHALE TWO PUFFS BY MOUTH into lungs EVERY 4 HOURS AS NEEDED FOR WHEEZING OR SHORTNESS OF BREATH 8.5 g 1   ALPRAZolam (XANAX) 0.5 MG tablet TAKE 1 TABLET BY MOUTH 3 TIMES DAILY AS NEEDED FOR STRESS 90 tablet 0   amLODipine (NORVASC) 10 MG tablet Take 1 tablet (10 mg total) by mouth daily. 30 tablet 0   Apoaequorin (PREVAGEN PO) Take 1 tablet by mouth daily.     atorvastatin (LIPITOR) 40 MG tablet Take 1 tablet (40 mg total) by mouth daily. 90 tablet 3   Biotin 5000 MCG TABS Take 5,000 mcg by mouth daily.     Calcium Carbonate (CALCIUM 600 PO) Take 1,200 mg by mouth daily.      carboxymethylcellulose (REFRESH PLUS) 0.5 % SOLN Place 2 drops into both eyes daily as needed (dry/irritated eyes.).     Coenzyme Q10 200 MG capsule Take 200 mg by mouth daily.     fluticasone (FLONASE) 50 MCG/ACT nasal spray Place 2 sprays into both nostrils daily.      furosemide (LASIX) 40 MG tablet Take 1 tablet (40 mg total) by mouth daily. 90 tablet 3   hydrALAZINE (APRESOLINE) 50 MG tablet Take 1 tablet (50 mg total) by mouth 3 (three) times daily. 270 tablet 3   metoprolol tartrate (LOPRESSOR) 100 MG tablet TAKE 1 TABLET BY MOUTH 2 TIMES DAILY 180 tablet 0   Multiple Vitamins-Minerals (AIRBORNE PO) Take 1 tablet by mouth daily as needed (immune support).     Multiple Vitamins-Minerals (CENTRUM SILVER ULTRA WOMENS) TABS Take 1 tablet by mouth every evening.     potassium chloride SA (KLOR-CON) 20 MEQ tablet TAKE 1 TABLET BY MOUTH 2 TIMES DAILY 180  tablet 3   Rivaroxaban (XARELTO) 15 MG TABS tablet Take 1 tablet (15 mg total) by mouth daily at 12 noon. 90 tablet 1   No facility-administered medications prior to visit.    Allergies  Allergen Reactions   Augmentin [Amoxicillin-Pot Clavulanate] Nausea And Vomiting and Other (See Comments)    "projectile vomiting" Has patient had a PCN reaction causing immediate rash, facial/tongue/throat swelling, SOB or lightheadedness with hypotension:No Has patient had a PCN reaction causing severe rash involving mucus membranes or skin necrosis:No Has patient had a PCN reaction that required hospitalization:No Has patient had a PCN reaction occurring within the last 10 years:Yes If all of the above answers are "NO", then may proceed with Cephalosporin use.  Amoxicillin Rash   Clindamycin/Lincomycin Rash    ROS As per HPI  PE: Vitals with BMI 12/30/2020 12/18/2020 10/28/2020  Height 5' 1"  5' 1"  5' 1"   Weight 168 lbs 13 oz 170 lbs 10 oz 166 lbs 13 oz  BMI 31.91 43.27 61.47  Systolic 092 957 473  Diastolic 82 80 65  Pulse 66 67 65     Physical Exam  ***  LABS:  Last CBC Lab Results  Component Value Date   WBC 8.2 09/24/2020   HGB 11.7 09/24/2020   HCT 36.4 09/24/2020   MCV 90 09/24/2020   MCH 29.0 09/24/2020   RDW 13.8 09/24/2020   PLT 460 (H) 40/37/0964   Last metabolic panel Lab Results  Component Value Date   GLUCOSE 132 (H) 10/28/2020   NA 144 10/28/2020   K 3.9 10/28/2020   CL 105 10/28/2020   CO2 32 10/28/2020   BUN 21 10/28/2020   CREATININE 1.14 10/28/2020   EGFR 67 09/24/2020   CALCIUM 9.8 10/28/2020   PHOS 2.3 (L) 09/18/2020   PROT 6.6 10/28/2020   ALBUMIN 3.9 10/28/2020   LABGLOB 2.4 09/24/2020   AGRATIO 1.5 09/24/2020   BILITOT 0.5 10/28/2020   ALKPHOS 88 10/28/2020   AST 20 10/28/2020   ALT 15 10/28/2020   ANIONGAP 6 09/18/2020   Last lipids Lab Results  Component Value Date   CHOL 126 08/27/2020   HDL 42.60 08/27/2020   LDLCALC 53  08/27/2020   LDLDIRECT 66.0 09/10/2018   TRIG 152.0 (H) 08/27/2020   CHOLHDL 3 08/27/2020   Last hemoglobin A1c No results found for: HGBA1C Last thyroid functions Lab Results  Component Value Date   TSH 0.68 10/22/2018   T3TOTAL 115.0 07/04/2016   IMPRESSION AND PLAN:  No problem-specific Assessment & Plan notes found for this encounter.  No labs needed today  An After Visit Summary was printed and given to the patient.  FOLLOW UP: No follow-ups on file. Cpe set for 03/02/21  Signed:  Crissie Sickles, MD           01/29/2021

## 2021-02-05 ENCOUNTER — Encounter: Payer: Self-pay | Admitting: Family Medicine

## 2021-02-05 ENCOUNTER — Ambulatory Visit: Payer: Medicare PPO | Admitting: Family Medicine

## 2021-02-05 ENCOUNTER — Other Ambulatory Visit: Payer: Self-pay

## 2021-02-05 VITALS — BP 136/73 | HR 66 | Temp 97.4°F | Ht 61.0 in | Wt 171.2 lb

## 2021-02-05 DIAGNOSIS — I10 Essential (primary) hypertension: Secondary | ICD-10-CM

## 2021-02-05 NOTE — Progress Notes (Signed)
OFFICE VISIT  02/05/2021  CC:  Chief Complaint  Patient presents with   Follow-up    hypertension    HPI:    Patient is a 86 y.o. female who presents for 5-week follow-up uncontrolled hypertension. A/P as of last visit: "Uncontrolled hypertension.  Improving some.  Increase amlodipine to 10 mg/day.  Continue home BP monitoring."  INTERIM HX: Brenda Matthews feels good.  It is her birthday today. She has been busy at her retirement home, 100s of people wishing her happy birthday and this makes her feel good. Home blood pressures--- she left her list at home today but says the highest it has been is 683 systolic, lowest systolic 419.  She does not recall any diastolics. No problems taking 10 mg amlodipine, 50 mg hydralazine 3 times daily, and Lopressor 100 twice daily.   Past Medical History:  Diagnosis Date   Abnormal EKG approx 2008   Nuclear stress test neg;    Acute pancreatitis 09/2020   idiopathic.  +panc psudocyst   Anxiety    with panic   CAP (community acquired pneumonia) 08/2017   Hospitalization for CAP/acute diast HF/rapid a-fib   Cataract    s/p surgery--lens implants   Chronic diastolic heart failure ([place]) 2019   Chronic renal insufficiency, stage 3 (moderate) ([place]) 12/04/2012   Renal u/s when in hosp 08/2017 for CAP/CHF showed symmetric kidneys, echogenicity normal, w/out hydronephrosis. Baseline GFR around 40 ml/min as of 10/2018.   DDD (degenerative disc disease), lumbar    Diverticulosis 2009   Fracture of radial shaft, left, closed 11/16/2010   fell down flight of stairs   History of kidney stones    Hx of adenomatous colonic polyps 2002;2009;2015   surveillance colonoscopy 2009, +polypectomy done-tubular adenoma w/out high grade.  05/2013 tubular adenomas--recall 3 yrs   Hyperlipidemia    Hypertension    Low TSH level 02/18/2016   T3 norm, T4 mildly elevated--suspected sick euthyroid syndrome.  Repeat labs 06/2016: normal.   Melanoma in situ ([place]) 06/2018   L LL    Osteoarthritis of both knees    viscosupplementation injections helpful 2020/21   Osteopenia    DEXA 08/2010; repeat DEXA 02/2015 worse: fosamax started.  06/2018 Dexa T score -2.4.  2020 maj osteop fx risk = 24%, Hip fx risk 7.3%. 09/2020 T score -2.1   PAF (paroxysmal atrial fibrillation) ([place] ) 02/2016   when in post-op for ankle surgery; spontaneously converted in hosp, seen by Dr. Debara Pickett in consultation--metoprolol rate control + xarelto recommended.  Metop d/c due to hypot.  Plan to cont xarelto 20 mg qd indef due to CHAD-VASc score of 3.  A-fib w/RVR and CHF 07/2017; pt placed on amiodarone and plan for CV, but pt was in sinus rhythm when she went in for her DC CV, so she was sent home.   Peripheral edema    Pneumonia 2015   hx with sepsis   Rheumatic fever     Past Surgical History:  Procedure Laterality Date   APPENDECTOMY  1966   done during surgery for tubal pregnancy   CATARACT EXTRACTION W/ INTRAOCULAR LENS IMPLANT  2013   bilat   COLONOSCOPY W/ POLYPECTOMY  05/2013   +diverticulosis; recall 3 yrs (Dr. Henrene Pastor)   DEXA  02/2015; 06/2018   T score -2.1 in both femoral necks; FRAX 10 yr risk of major osteoporotic fracture was 21%---fosamax started. 06/2018 T score -2.4.  T score 09/2020 -2.1. Rpt 2 yrs.   ECTOPIC PREGNANCY SURGERY  EYE SURGERY     LEFT HEART CATH AND CORONARY ANGIOGRAPHY N/A 09/01/2017   No angiographically significant CAD.  Upper normal left ventricular filling pressure.  Procedure: LEFT HEART CATH AND CORONARY ANGIOGRAPHY;  Surgeon: Nelva Bush, MD;  Location: [facility];  Service: Cardiovascular;  Laterality: N/A;   LUMBAR LAMINECTOMY/DECOMPRESSION MICRODISCECTOMY N/A 09/26/2018   Procedure: Decompressive Lumbar Laminectomy L5 S1 FORAMINOTOMY L5 S1  NERVE ROOT BILATERALLY and Microdiscectomy L5-S1 Left;  Surgeon: Latanya Maudlin, MD;  Location: [facility];  Service: Orthopedics;  Laterality: N/A;  132mn   OPEN REDUCTION INTERNAL FIXATION (ORIF)  TIBIA/FIBULA FRACTURE Left 02/18/2016   Procedure: OPEN REDUCTION INTERNAL FIXATION (ORIF) Right ankle trimalleolar fracture;  Surgeon: JWylene Simmer MD;  Location: [facility]  Service: Orthopedics;  Laterality: Left;  requests 956ms   ORIF RADIAL FRACTURE  11/18/2010   left; s/p slip on slippery floor and fell   THORACENTESIS  08/2017   diagnostic and therapeutic.  Transudative.  Clx neg.  (+pulm edema/diastolic HF)   TONSILLECTOMY     TRANSTHORACIC ECHOCARDIOGRAM  02/18/2016; 08/10/17   LVEF of 55-60%, mild AI and mild MR and normal biatrial size.  07/2017--normal LV function, mild enlarge aortic root, mild/mod TR, bilat atrial enlargement.    Outpatient Medications Prior to Visit  Medication Sig Dispense Refill   acetaminophen (TYLENOL) 650 MG CR tablet Take 650 mg by mouth in the morning and at bedtime.     albuterol (VENTOLIN HFA) 108 (90 Base) MCG/ACT inhaler INHALE TWO PUFFS BY MOUTH into lungs EVERY 4 HOURS AS NEEDED FOR WHEEZING OR SHORTNESS OF BREATH 8.5 g 1   ALPRAZolam (XANAX) 0.5 MG tablet TAKE 1 TABLET BY MOUTH 3 TIMES DAILY AS NEEDED FOR STRESS 90 tablet 0   amLODipine (NORVASC) 10 MG tablet Take 1 tablet (10 mg total) by mouth daily. 30 tablet 0   Apoaequorin (PREVAGEN PO) Take 1 tablet by mouth daily.     atorvastatin (LIPITOR) 40 MG tablet Take 1 tablet (40 mg total) by mouth daily. 90 tablet 3   Biotin 5000 MCG TABS Take 5,000 mcg by mouth daily.     Calcium Carbonate (CALCIUM 600 PO) Take 1,200 mg by mouth daily.      carboxymethylcellulose (REFRESH PLUS) 0.5 % SOLN Place 2 drops into both eyes daily as needed (dry/irritated eyes.).     Coenzyme Q10 200 MG capsule Take 200 mg by mouth daily.     fluticasone (FLONASE) 50 MCG/ACT nasal spray Place 2 sprays into both nostrils daily.      furosemide (LASIX) 40 MG tablet Take 1 tablet (40 mg total) by mouth daily. 90 tablet 3   hydrALAZINE (APRESOLINE) 50 MG tablet Take 1 tablet (50 mg total) by mouth 3 (three) times daily. 270  tablet 3   metoprolol tartrate (LOPRESSOR) 100 MG tablet TAKE 1 TABLET BY MOUTH 2 TIMES DAILY 180 tablet 0   Multiple Vitamins-Minerals (AIRBORNE PO) Take 1 tablet by mouth daily as needed (immune support).     Multiple Vitamins-Minerals (CENTRUM SILVER ULTRA WOMENS) TABS Take 1 tablet by mouth every evening.     potassium chloride SA (KLOR-CON) 20 MEQ tablet TAKE 1 TABLET BY MOUTH 2 TIMES DAILY 180 tablet 3   Rivaroxaban (XARELTO) 15 MG TABS tablet Take 1 tablet (15 mg total) by mouth daily at 12 noon. 90 tablet 1   No facility-administered medications prior to visit.    Allergies  Allergen Reactions   Augmentin [Amoxicillin-Pot Clavulanate] Nausea And Vomiting and Other (See  Comments)    "projectile vomiting" Has patient had a PCN reaction causing immediate rash, facial/tongue/throat swelling, SOB or lightheadedness with hypotension:No Has patient had a PCN reaction causing severe rash involving mucus membranes or skin necrosis:No Has patient had a PCN reaction that required hospitalization:No Has patient had a PCN reaction occurring within the last 10 years:Yes If all of the above answers are "NO", then may proceed with Cephalosporin use.    Amoxicillin Rash   Clindamycin/Lincomycin Rash    ROS As per HPI  PE: Vitals with BMI 02/05/2021 12/30/2020 12/18/2020  Height _0  _1  _2   Weight 171 lbs 3 oz 168 lbs 13 oz 170 lbs 10 oz  BMI 32.36 55.20 80.22  Systolic 336 122 449  Diastolic 73 82 80  Pulse 66 66 67     Physical Exam  Gen: Alert, well appearing.  Patient is oriented to person, place, time, and situation. AFFECT: pleasant, lucid thought and speech. CV: RRR, no m/r/g.   LUNGS: CTA bilat, nonlabored resps, good aeration in all lung fields.   LABS:  Last CBC Lab Results  Component Value Date   WBC 8.2 09/24/2020   HGB 11.7 09/24/2020   HCT 36.4 09/24/2020   MCV 90 09/24/2020   MCH 29.0 09/24/2020   RDW 13.8 09/24/2020   PLT 460 (H) 75/30/0511   Last  metabolic panel Lab Results  Component Value Date   GLUCOSE 132 (H) 10/28/2020   NA 144 10/28/2020   K 3.9 10/28/2020   CL 105 10/28/2020   CO2 32 10/28/2020   BUN 21 10/28/2020   CREATININE 1.14 10/28/2020   EGFR 67 09/24/2020   CALCIUM 9.8 10/28/2020   PHOS 2.3 (L) 09/18/2020   PROT 6.6 10/28/2020   ALBUMIN 3.9 10/28/2020   LABGLOB 2.4 09/24/2020   AGRATIO 1.5 09/24/2020   BILITOT 0.5 10/28/2020   ALKPHOS 88 10/28/2020   AST 20 10/28/2020   ALT 15 10/28/2020   ANIONGAP 6 09/18/2020   Lab Results  Component Value Date   CHOL 126 08/27/2020   HDL 42.60 08/27/2020   LDLCALC 53 08/27/2020   LDLDIRECT 66.0 09/10/2018   TRIG 152.0 (H) 08/27/2020   CHOLHDL 3 08/27/2020   IMPRESSION AND PLAN:  1) Hypertension, well controlled.  Continue amlodipine 10 mg a day, hydralazine 50 mg 3 times daily, and Lopressor 100 mg twice daily.  CRI III: Okay to hold off on recheck of kidney function until next scheduled follow-up 03/02/2021.  We will check lipid panel at that time as well.  An After Visit Summary was printed and given to the patient.  FOLLOW UP: Return for Has follow-up already set for about a month from now..  Signed:  Crissie Sickles, MD           02/05/2021

## 2021-02-16 ENCOUNTER — Other Ambulatory Visit: Payer: Self-pay | Admitting: Family Medicine

## 2021-03-02 ENCOUNTER — Encounter: Payer: Medicare PPO | Admitting: Family Medicine

## 2021-03-18 ENCOUNTER — Other Ambulatory Visit: Payer: Self-pay | Admitting: Internal Medicine

## 2021-03-20 ENCOUNTER — Other Ambulatory Visit: Payer: Self-pay | Admitting: Family Medicine

## 2021-03-22 NOTE — Progress Notes (Unsigned)
Cardiology Office Note:    Date:  03/22/2021   ID:  Brenda Matthews, DOB 06/27/1935, MRN [id number]  PCP:  [name], MD  Cardiologist:  [name], MD  Electrophysiologist:  None   Referring MD: [name], MD   Chief Complaint: follow-up of atrial fibrillation and CHF  History of Present Illness:    Brenda Matthews is a 86 y.o. female with a history of normal coronaries on cardiac catheterization in 08/2017, paroxysmal atrial fibrillation, chronic diastolic CHF, hypertension, hyperlipidemia, hyperthyroidism, and CKD stage III who is followed by  Dr. Debara Pickett  and presents today for routine follow-up.   Patient was first seen by Dr. Debara Pickett in 02/2016 for new onset of atrial fibrillation with RVR after induction with anesthesia during surgery for ankle fracture. Echo showed normal LV function. She was initially started on low dose beta-blocker and Xarelto. She was ultimately taken off beta-blocker due to low BP but thankfully converted back to sinus rhythm on her home. She was advised to continued Xarelto given CHA2DS2-VASc score of 3. She had recurrent episode of atrial fibrillation with RVR in 07/2017. Echo showed LVEF of 50-55% with normal wall motion, biatrial enlargement, mild MR, and mild to moderate TR.Marland Kitchen She was started on Amiodarone and outpatient DCCV was arranged. However, when she presented for procedure, she was back in sinus rhythm. Patient was admitted in 08/2017 with acute on chronic hypoxic respiratory failure  secondary to pneumonia with sepsis and acute diastolic CHF. She was noted to be back in atrial fibrillation with RVR. Troponin was elevated so she underwent a cardiac catheterization which showed normal coronaries. She was diuresed and treated with antibiotics for her pneumonia. At follow-up visit, she was noted to be back in sinus rhythm. She has had no recurrence of atrial fibrillation since that time so Amiodarone was stopped in 05/2019.   Patient was last seen by me  in 09/2020 at which time she was doing relatively well from a cardiac standpoint. She did report worsening lower extremity edema after receiving a lot of IV fluids during recent hospitalization for pancreatitis. She also noted occasional palpitations lasting for less than 5 minutes at time. She was maintain sinus rhythm at that visit. She was noted to have significant lower extremity edema on exam Pro-BNP was ordered and was elevated at 2,759. Lasix was increased to twice daily for 5 days. Xarelto was also increased to 86m daily given improvement in renal function.  Patient presents today for follow-up. ***  Paroxysmal Atrial Fibrillation Previously on Amiodarone but this was stopped in 05/2019 as patient had no documented recurrence of atrial fibrillation since 2019.  - Maintaining sinus rhythm on exam. - Continue Lopressor 1070mtwice daily.  - Continue Xarelto 2035maily. ***   Chronic Diastolic CHF Lower Extremity Edema *** Echo in 07/2017 showed LVEF of 50-55% with normal wall motion, biatrial enlargement, mild MR, and mild to moderate TR. - *** - Continue Lasix 103m31mily. Also on KCl 20 MEq twice daily with this. - Discussed importance of daily weights and sodium/fluid restrictions.   Mild Mitral Regurgitation Mild to Moderate Tricuspid Regurgitation Noted on Echo in 07/2017. - Will order repeat Echo for routine monitoring of this. ***   Hypertension BP well controlled.  - Continue current medications: Amlodipine 5mg 33mly, Lopressor 100mg 24me daily, Hydralazine 50mg t12m times daily.    Hyperlipidemia Lipid panel on 08/27/2020: Total Cholesterol 126, Triglycerides 152, HDL 42.60, LDL 53. - Continue Lipitor 103mg da55m  CKD Stage III Baseline creatinine 1.1 to 1.6. Stable at 1.14 on most labs in 10/2020.  Past Medical History:  Diagnosis Date   Abnormal EKG approx 2008   Nuclear stress test neg;    Acute pancreatitis 09/2020   idiopathic.  +panc psudocyst   Anxiety     with panic   CAP (community acquired pneumonia) 08/2017   Hospitalization for CAP/acute diast HF/rapid a-fib   Cataract    s/p surgery--lens implants   Chronic diastolic heart failure ([place]) 2019   Chronic renal insufficiency, stage 3 (moderate) ([place]) 12/04/2012   Renal u/s when in hosp 08/2017 for CAP/CHF showed symmetric kidneys, echogenicity normal, w/out hydronephrosis. Baseline GFR around 40 ml/min as of 10/2018.   DDD (degenerative disc disease), lumbar    Diverticulosis 2009   Fracture of radial shaft, left, closed 11/16/2010   fell down flight of stairs   History of kidney stones    Hx of adenomatous colonic polyps 2002;2009;2015   surveillance colonoscopy 2009, +polypectomy done-tubular adenoma w/out high grade.  05/2013 tubular adenomas--recall 3 yrs   Hyperlipidemia    Hypertension    Low TSH level 02/18/2016   T3 norm, T4 mildly elevated--suspected sick euthyroid syndrome.  Repeat labs 06/2016: normal.   Melanoma in situ ([place]) 06/2018   L LL   Osteoarthritis of both knees    viscosupplementation injections helpful 2020/21   Osteopenia    DEXA 08/2010; repeat DEXA 02/2015 worse: fosamax started.  06/2018 Dexa T score -2.4.  2020 maj osteop fx risk = 24%, Hip fx risk 7.3%. 09/2020 T score -2.1   PAF (paroxysmal atrial fibrillation) ([place]) 02/2016   when in post-op for ankle surgery; spontaneously converted in hosp, seen by Dr. Debara Pickett in consultation--metoprolol rate control + xarelto recommended.  Metop d/c due to hypot.  Plan to cont xarelto 20 mg qd indef due to CHAD-VASc score of 3.  A-fib w/RVR and CHF 07/2017; pt placed on amiodarone and plan for CV, but pt was in sinus rhythm when she went in for her DC CV, so she was sent home.   Peripheral edema    Pneumonia 2015   hx with sepsis   Rheumatic fever     Past Surgical History:  Procedure Laterality Date   APPENDECTOMY  1966   done during surgery for tubal pregnancy   CATARACT EXTRACTION W/ INTRAOCULAR LENS IMPLANT  2013    bilat   COLONOSCOPY W/ POLYPECTOMY  05/2013   +diverticulosis; recall 3 yrs (Dr. Henrene Pastor)   DEXA  02/2015; 06/2018   T score -2.1 in both femoral necks; FRAX 10 yr risk of major osteoporotic fracture was 21%---fosamax started. 06/2018 T score -2.4.  T score 09/2020 -2.1. Rpt 2 yrs.   ECTOPIC PREGNANCY SURGERY     EYE SURGERY     LEFT HEART CATH AND CORONARY ANGIOGRAPHY N/A 09/01/2017   No angiographically significant CAD.  Upper normal left ventricular filling pressure.  Procedure: LEFT HEART CATH AND CORONARY ANGIOGRAPHY;  Surgeon: Nelva Bush, MD;  Location: [facility];  Service: Cardiovascular;  Laterality: N/A;   LUMBAR LAMINECTOMY/DECOMPRESSION MICRODISCECTOMY N/A 09/26/2018   Procedure: Decompressive Lumbar Laminectomy L5 S1 FORAMINOTOMY L5 S1  NERVE ROOT BILATERALLY and Microdiscectomy L5-S1 Left;  Surgeon: Latanya Maudlin, MD;  Location: [facility];  Service: Orthopedics;  Laterality: N/A;  134mn   OPEN REDUCTION INTERNAL FIXATION (ORIF) TIBIA/FIBULA FRACTURE Left 02/18/2016   Procedure: OPEN REDUCTION INTERNAL FIXATION (ORIF) Right ankle trimalleolar fracture;  Surgeon: JWylene Simmer MD;  Location:  [facility];  Service: Orthopedics;  Laterality: Left;  requests 4mns   ORIF RADIAL FRACTURE  11/18/2010   left; s/p slip on slippery floor and fell   THORACENTESIS  08/2017   diagnostic and therapeutic.  Transudative.  Clx neg.  (+pulm edema/diastolic HF)   TONSILLECTOMY     TRANSTHORACIC ECHOCARDIOGRAM  02/18/2016; 08/10/17   LVEF of 55-60%, mild AI and mild MR and normal biatrial size.  07/2017--normal LV function, mild enlarge aortic root, mild/mod TR, bilat atrial enlargement.    Current Medications: No outpatient medications have been marked as taking for the 03/30/21 encounter (Appointment) with GDarreld Mclean PA-C.     Allergies:   Augmentin [amoxicillin-pot clavulanate], Amoxicillin, and Clindamycin/lincomycin   Social History   Socioeconomic History   Marital status:  Widowed    Spouse name: Not on file   Number of children: Not on file   Years of education: Not on file   Highest education level: Not on file  Occupational History   Not on file  Tobacco Use   Smoking status: Never   Smokeless tobacco: Never  Vaping Use   Vaping Use: Never used  Substance and Sexual Activity   Alcohol use: Yes    Comment: rarely   Drug use: Never   Sexual activity: Not on file  Other Topics Concern   Not on file  Social History Narrative   Widow, 2 sons.   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  Rare alcohol.   No drugs.  Exercise: 4 times per week, about 481m   Social Determinants of Health   Financial Resource Strain: Low Risk    Difficulty of Paying Living Expenses: Not hard at all  Food Insecurity: No Food Insecurity   Worried About RuCharity fundraisern the Last Year: Never true   [place] the Last Year: Never true  Transportation Needs: No Transportation Needs   Lack of Transportation (Medical): No   Lack of Transportation (Non-Medical): No  Physical Activity: Insufficiently Active   Days of Exercise per Week: 3 days   Minutes of Exercise per Session: 30 min  Stress: Not on file  Social Connections: Moderately Integrated   Frequency of Communication with Friends and Family: More than three times a week   Frequency of Social Gatherings with Friends and Family: More than three times a week   Attends Religious Services: More than 4 times per year   Active Member of [employer] Organizations: Yes   Attends ClArchivisteetings: More than 4 times per year   Marital Status: Widowed     Family History: The patient's family history includes Diabetes in her sister; Heart disease in her father and mother; Hypertension in her brother. There is no history of Colon cancer, Pancreatic cancer, Rectal cancer, or Stomach cancer.  ROS:   Please see the history of present illness.     EKGs/Labs/Other Studies Reviewed:    The  following studies were reviewed today:  Echocardiogram 08/10/2017: Study Conclusions: - Left ventricle: The cavity size was normal. Wall thickness was increased in a pattern of mild LVH. Systolic function was normal. The estimated ejection fraction was in the range of 50% to 55%.  Wall motion was normal; there were no regional wall motion abnormalities.  - Aortic valve: There was trivial regurgitation.  - Aortic root: The aortic root was mildly dilated.  - Mitral valve: There was mild regurgitation.  - Left atrium: The atrium was severely  dilated.  - Right atrium: The atrium was mildly dilated.  - Tricuspid valve: There was mild-moderate regurgitation.  - Pulmonary arteries: PA peak pressure: 32 mm Hg (S).  - Pericardium, extracardiac: A trivial pericardial effusion was identified.   Impressions: - Normal LV systolic function; mild LVH; trace AI; mildly dilated aortic root; mild MR; biatrial enlargement; mild to moderate TR.  _______________   Left Cardiac Catheterization 09/01/2017: Conclusions: No angiographically significant coronary artery disease. Upper normal left ventricular filling pressure.   Recommendations: Continue medical therapy of diastolic heart failure and atrial fibrillation. Gentle post-catheterization hydration given resolved acute kidney injury. Restart heparin infusion 2 hours after TR band removal; rivaroxaban could be restarted as soon as tomorrow.   Recommend to resume Rivaroxaban, at currently prescribed dose and frequency, on 09/01/17.  Concurrent antiplatelet therapy not recommended.   EKG:  EKG not ordered today.   Recent Labs: 09/18/2020: Magnesium 2.0 09/24/2020: Hemoglobin 11.7; NT-Pro BNP 2,759; Platelets 460 10/28/2020: ALT 15; BUN 21; Creatinine, Ser 1.14; Potassium 3.9; Sodium 144  Recent Lipid Panel    Component Value Date/Time   CHOL 126 08/27/2020 0934   TRIG 152.0 (H) 08/27/2020 0934   HDL 42.60 08/27/2020 0934   CHOLHDL 3 08/27/2020 0934    VLDL 30.4 08/27/2020 0934   LDLCALC 53 08/27/2020 0934   LDLDIRECT 66.0 09/10/2018 0913    Physical Exam:    Vital Signs: LMP  (LMP Unknown)     Wt Readings from Last 3 Encounters:  02/05/21 171 lb 3.2 oz (77.7 kg)  12/30/20 168 lb 12.8 oz (76.6 kg)  12/18/20 170 lb 9.6 oz (77.4 kg)     General: 86 y.o. female in no acute distress. HEENT: Normocephalic and atraumatic. Sclera clear. EOMs intact. Neck: Supple. No carotid bruits. No JVD. Heart: *** RRR. Distinct S1 and S2. No murmurs, gallops, or rubs. Radial and distal pedal pulses 2+ and equal bilaterally. Lungs: No increased work of breathing. Clear to ausculation bilaterally. No wheezes, rhonchi, or rales.  Abdomen: Soft, non-distended, and non-tender to palpation. Bowel sounds present in all 4 quadrants.  MSK: Normal strength and tone for age. *** Extremities: No lower extremity edema.    Skin: Warm and dry. Neuro: Alert and oriented x3. No focal deficits. Psych: Normal affect. Responds appropriately.   Assessment:    No diagnosis found.  Plan:     Disposition: Follow up in ***   Medication Adjustments/Labs and Tests Ordered: Current medicines are reviewed at length with the patient today.  Concerns regarding medicines are outlined above.  No orders of the defined types were placed in this encounter.  No orders of the defined types were placed in this encounter.   There are no Patient Instructions on file for this visit.   Signed, Darreld Mclean, PA-C  03/22/2021 1:35 PM    [facility]

## 2021-03-24 ENCOUNTER — Other Ambulatory Visit: Payer: Self-pay | Admitting: Family Medicine

## 2021-03-30 ENCOUNTER — Ambulatory Visit: Payer: Medicare PPO | Admitting: Student

## 2021-03-31 ENCOUNTER — Encounter: Payer: Medicare PPO | Admitting: Family Medicine

## 2021-04-01 ENCOUNTER — Other Ambulatory Visit: Payer: Self-pay | Admitting: Family Medicine

## 2021-04-01 NOTE — Telephone Encounter (Signed)
Pt has upcoming appt on 3/27 ?

## 2021-04-03 ENCOUNTER — Other Ambulatory Visit: Payer: Self-pay | Admitting: Family Medicine

## 2021-04-05 ENCOUNTER — Telehealth: Payer: Self-pay | Admitting: Family Medicine

## 2021-04-05 MED ORDER — ALPRAZOLAM 0.5 MG PO TABS: ORAL_TABLET | ORAL | 5 refills | Status: DC

## 2021-04-05 NOTE — Telephone Encounter (Signed)
Requesting:xanax ?Contract:07/10/19 ?UDS:n/a ?Last Visit:02/05/21 ?Next Visit:04/12/21 ?Last Refill:01/05/21 (90,0) ? ?Please Advise ? ?

## 2021-04-05 NOTE — Telephone Encounter (Signed)
Alprazolam prescription sent in ?

## 2021-04-05 NOTE — Telephone Encounter (Signed)
FYI: ? ?[facility] calling about pt medication  ? ?ALPRAZolam ?ALPRAZolam (XANAX) 0.5 MG tablet ? ?Informed her pt called already regarding medication. ? ? ? ? ? ?

## 2021-04-05 NOTE — Telephone Encounter (Signed)
noted 

## 2021-04-05 NOTE — Telephone Encounter (Signed)
Pt called about med refill ? ?ALPRAZolam ?ALPRAZolam (XANAX) 0.5 MG tablet ? ?Pt said she went to go get medication on Saturday they informed her that she has to call out office. ? ?Then she was told that it's to soon to fill the prescription. Pt said pharmacy called our office last week about medication. ? ?Pt cell:[phone] ? ?[facility] - [place], [place] - [address] Phone:  [phone]  ?Fax:  [phone]  ?  ? ?

## 2021-04-08 DIAGNOSIS — M170 Bilateral primary osteoarthritis of knee: Secondary | ICD-10-CM | POA: Diagnosis not present

## 2021-04-12 ENCOUNTER — Encounter: Payer: Medicare PPO | Admitting: Family Medicine

## 2021-04-12 ENCOUNTER — Encounter: Payer: Self-pay | Admitting: Family Medicine

## 2021-04-12 ENCOUNTER — Telehealth: Payer: Self-pay

## 2021-04-12 DIAGNOSIS — R7301 Impaired fasting glucose: Secondary | ICD-10-CM

## 2021-04-12 NOTE — Telephone Encounter (Signed)
Please add additional info and route to PCP ? ?[facility] - Client ?Nonclinical Telephone Record  ?[facility]? ?Client [facility] ?[facility] ?Provider Crissie Sickles - MD ?Contact Type Call ?Who Is Calling Patient / Member / Family / Caregiver ?Caller Name [name] ?Caller Phone Number [phone] ?Patient Name Brenda Matthews ?Patient DOB 1935-09-27 ?Call Type Message Only Information Provided ?Reason for Call Request to Reschedule Office Appointment ?Initial Comment Caller has 9 am appt and car wont start. She asks to reschedule and apologizes. ?Disp. Time Disposition Final User ?04/12/2021 8:01:12 AM General Information Provided Yes Gokounous, Erin ?Call Closed By: Candy Sledge ?Transaction Date/Time: 04/12/2021 7:57:25 AM (ET ?

## 2021-04-12 NOTE — Telephone Encounter (Signed)
Patient was transferred from after hours clinic to [place] line at approx time of 7:58AM on 04/12/21. With this occurrence today, patient has total of 8 appts, she has not giving 24 hour notice of cancellation.  Letter printed, signed by provider, and mailed to patient. ?

## 2021-04-12 NOTE — Progress Notes (Deleted)
Office Note ?04/12/2021 ? ?CC: No chief complaint on file. ? ? ?HPI:  ?Patient is a 86 y.o. female who is here for f/u  HTN, HLD, CRI III, anxiety with high risk med use. ?She has chronic diastolic HF and PAF, is on chronic anticoagulation with Xarelto. ?A/P as of last visit on 02/06/20: ?"1) Hypertension, well controlled.  Continue amlodipine 10 mg a day, hydralazine 50 mg 3 times daily, and Lopressor 100 mg twice daily. ?  ?CRI III: Okay to hold off on recheck of kidney function until next scheduled follow-up 03/02/2021.  We will check lipid panel at that time as well." ? ?INTERIM HX: ?*** ? ? ?PMP AWARE reviewed today: most recent rx for alprazolam was filled 04/05/2021, #90, rx by me. ?No red flags. ? ? ?Past Medical History:  ?Diagnosis Date  ? Abnormal EKG approx 2008  ? Nuclear stress test neg;   ? Acute pancreatitis 09/2020  ? idiopathic.  +panc psudocyst  ? Anxiety   ? with panic  ? CAP (community acquired pneumonia) 08/2017  ? Hospitalization for CAP/acute diast HF/rapid a-fib  ? Cataract   ? s/p surgery--lens implants  ? Chronic diastolic heart failure ([place]) 2019  ? Chronic renal insufficiency, stage 3 (moderate) ([place]) 12/04/2012  ? Renal u/s when in hosp 08/2017 for CAP/CHF showed symmetric kidneys, echogenicity normal, w/out hydronephrosis. Baseline GFR around 40 ml/min as of 10/2018.  ? DDD (degenerative disc disease), lumbar   ? Diverticulosis 2009  ? Fracture of radial shaft, left, closed 11/16/2010  ? fell down flight of stairs  ? History of kidney stones   ? Hx of adenomatous colonic polyps 2002;2009;2015  ? surveillance colonoscopy 2009, +polypectomy done-tubular adenoma w/out high grade.  05/2013 tubular adenomas--recall 3 yrs  ? Hyperlipidemia   ? Hypertension   ? Low TSH level 02/18/2016  ? T3 norm, T4 mildly elevated--suspected sick euthyroid syndrome.  Repeat labs 06/2016: normal.  ? Melanoma in situ ([place]) 06/2018  ? L LL  ? Osteoarthritis of both knees   ? viscosupplementation injections  helpful 2020/21  ? Osteopenia   ? DEXA 08/2010; repeat DEXA 02/2015 worse: fosamax started.  06/2018 Dexa T score -2.4.  2020 maj osteop fx risk = 24%, Hip fx risk 7.3%. 09/2020 T score -2.1  ? PAF (paroxysmal atrial fibrillation) ([place]) 02/2016  ? when in post-op for ankle surgery; spontaneously converted in hosp, seen by Dr. Debara Pickett in consultation--metoprolol rate control + xarelto recommended.  Metop d/c due to hypot.  Plan to cont xarelto 20 mg qd indef due to CHAD-VASc score of 3.  A-fib w/RVR and CHF 07/2017; pt placed on amiodarone and plan for CV, but pt was in sinus rhythm when she went in for her DC CV, so she was sent home.  ? Peripheral edema   ? Pneumonia 2015  ? hx with sepsis  ? Rheumatic fever   ? ? ?Past Surgical History:  ?Procedure Laterality Date  ? APPENDECTOMY  1966  ? done during surgery for tubal pregnancy  ? CATARACT EXTRACTION W/ INTRAOCULAR LENS IMPLANT  2013  ? bilat  ? COLONOSCOPY W/ POLYPECTOMY  05/2013  ? +diverticulosis; recall 3 yrs (Dr. Henrene Pastor)  ? DEXA  02/2015; 06/2018  ? T score -2.1 in both femoral necks; FRAX 10 yr risk of major osteoporotic fracture was 21%---fosamax started. 06/2018 T score -2.4.  T score 09/2020 -2.1. Rpt 2 yrs.  ? ECTOPIC PREGNANCY SURGERY    ? EYE SURGERY    ? LEFT HEART  CATH AND CORONARY ANGIOGRAPHY N/A 09/01/2017  ? No angiographically significant CAD.  Upper normal left ventricular filling pressure.  Procedure: LEFT HEART CATH AND CORONARY ANGIOGRAPHY;  Surgeon: Nelva Bush, MD;  Location: [facility];  Service: Cardiovascular;  Laterality: N/A;  ? LUMBAR LAMINECTOMY/DECOMPRESSION MICRODISCECTOMY N/A 09/26/2018  ? Procedure: Decompressive Lumbar Laminectomy L5 S1 FORAMINOTOMY L5 S1  NERVE ROOT BILATERALLY and Microdiscectomy L5-S1 Left;  Surgeon: Latanya Maudlin, MD;  Location: [facility];  Service: Orthopedics;  Laterality: N/A;  172mn  ? OPEN REDUCTION INTERNAL FIXATION (ORIF) TIBIA/FIBULA FRACTURE Left 02/18/2016  ? Procedure: OPEN REDUCTION INTERNAL  FIXATION (ORIF) Right ankle trimalleolar fracture;  Surgeon: JWylene Simmer MD;  [place] [facility]  Service: Orthopedics;  Laterality: Left;  requests 968ms  ? ORIF RADIAL FRACTURE  11/18/2010  ? left; s/p slip on slippery floor and fell  ? THORACENTESIS  08/2017  ? diagnostic and therapeutic.  Transudative.  Clx neg.  (+pulm edema/diastolic HF)  ? TONSILLECTOMY    ? TRANSTHORACIC ECHOCARDIOGRAM  02/18/2016; 08/10/17  ? LVEF of 55-60%, mild AI and mild MR and normal biatrial size.  07/2017--normal LV function, mild enlarge aortic root, mild/mod TR, bilat atrial enlargement.  ? ? ?Family History  ?Problem Relation Age of Onset  ? Heart disease Mother   ? Heart disease Father   ? Hypertension Brother   ? Diabetes Sister   ? Colon cancer Neg Hx   ? Pancreatic cancer Neg Hx   ? Rectal cancer Neg Hx   ? Stomach cancer Neg Hx   ? ? ?Social History  ? ?Socioeconomic History  ? Marital status: Widowed  ?  Spouse name: Not on file  ? Number of children: Not on file  ? Years of education: Not on file  ? Highest education level: Not on file  ?Occupational History  ? Not on file  ?Tobacco Use  ? Smoking status: Never  ? Smokeless tobacco: Never  ?Vaping Use  ? Vaping Use: Never used  ?Substance and Sexual Activity  ? Alcohol use: Yes  ?  Comment: rarely  ? Drug use: Never  ? Sexual activity: Not on file  ?Other Topics Concern  ? Not on file  ?Social History Narrative  ? Widow, 2 sons.  ? 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 No tobacco.  Rare alcohol.  ? No drugs.  Exercise: 4 times per week, about 14m36m ? ?Social Determinants of Health  ? ?Financial Resource Strain: Low Risk   ? Difficulty of Paying Living Expenses: Not hard at all  ?Food Insecurity: No Food Insecurity  ? Worried About RunCharity fundraiser the Last Year: Never true  ? Ran Out of Food in the Last Year: Never true  ?Transportation Needs: No Transportation Needs  ? Lack of Transportation (Medical): No  ? Lack of Transportation (Non-Medical): No  ?Physical Activity:  Insufficiently Active  ? Days of Exercise per Week: 3 days  ? Minutes of Exercise per Session: 30 min  ?Stress: Not on file  ?Social Connections: Moderately Integrated  ? Frequency of Communication with Friends and Family: More than three times a week  ? Frequency of Social Gatherings with Friends and Family: More than three times a week  ? Attends Religious Services: More than 4 times per year  ? Active Member of Clubs or Organizations: Yes  ? Attends CluArchivistetings: More than 4 times per year  ? Marital Status: Widowed  ?Intimate Partner Violence: Not At Risk  ?  Fear of Current or Ex-Partner: No  ? Emotionally Abused: No  ? Physically Abused: No  ? Sexually Abused: No  ? ? ?Outpatient Medications Prior to Visit  ?Medication Sig Dispense Refill  ? acetaminophen (TYLENOL) 650 MG CR tablet Take 650 mg by mouth in the morning and at bedtime.    ? albuterol (VENTOLIN HFA) 108 (90 Base) MCG/ACT inhaler INHALE TWO PUFFS BY MOUTH into lungs EVERY 4 HOURS AS NEEDED FOR WHEEZING OR SHORTNESS OF BREATH 8.5 g 1  ? ALPRAZolam (XANAX) 0.5 MG tablet 1 tab po tid prn anxiety 90 tablet 5  ? amLODipine (NORVASC) 10 MG tablet TAKE 1 TABLET BY MOUTH EVERY DAY 30 tablet 0  ? Apoaequorin (PREVAGEN PO) Take 1 tablet by mouth daily.    ? atorvastatin (LIPITOR) 40 MG tablet Take 1 tablet (40 mg total) by mouth daily. 90 tablet 3  ? Biotin 5000 MCG TABS Take 5,000 mcg by mouth daily.    ? Calcium Carbonate (CALCIUM 600 PO) Take 1,200 mg by mouth daily.     ? carboxymethylcellulose (REFRESH PLUS) 0.5 % SOLN Place 2 drops into both eyes daily as needed (dry/irritated eyes.).    ? Coenzyme Q10 200 MG capsule Take 200 mg by mouth daily.    ? fluticasone (FLONASE) 50 MCG/ACT nasal spray Place 2 sprays into both nostrils daily.     ? furosemide (LASIX) 40 MG tablet Take 1 tablet (40 mg total) by mouth daily. 90 tablet 3  ? hydrALAZINE (APRESOLINE) 50 MG tablet Take 1 tablet (50 mg total) by mouth 3 (three) times daily. 270 tablet  3  ? metoprolol tartrate (LOPRESSOR) 100 MG tablet TAKE 1 TABLET BY MOUTH 2 TIMES DAILY 180 tablet 0  ? Multiple Vitamins-Minerals (AIRBORNE PO) Take 1 tablet by mouth daily as needed (immune support).    ? Mu

## 2021-04-14 ENCOUNTER — Other Ambulatory Visit: Payer: Self-pay | Admitting: Family Medicine

## 2021-04-16 ENCOUNTER — Other Ambulatory Visit: Payer: Self-pay | Admitting: Family Medicine

## 2021-04-20 ENCOUNTER — Other Ambulatory Visit: Payer: Self-pay | Admitting: Family Medicine

## 2021-04-20 ENCOUNTER — Encounter: Payer: Medicare PPO | Admitting: Family Medicine

## 2021-04-27 ENCOUNTER — Other Ambulatory Visit: Payer: Self-pay | Admitting: Internal Medicine

## 2021-04-27 DIAGNOSIS — I480 Paroxysmal atrial fibrillation: Secondary | ICD-10-CM

## 2021-04-27 NOTE — Telephone Encounter (Signed)
Prescription refill request for Xarelto received.  ?Indication: Afib  ?Last office visit:09/24/20 Sarajane Jews)  ?Weight: 77.7kg ?Age:86 ?Scr:1.14 (10/28/20) ?CrCl: 45.49m/min ? ?Appropriate dose and refill sent to requested pharmacy.  ? ? ? ? ? ? ? ?

## 2021-04-28 ENCOUNTER — Other Ambulatory Visit: Payer: Self-pay | Admitting: Internal Medicine

## 2021-04-28 ENCOUNTER — Other Ambulatory Visit: Payer: Self-pay | Admitting: Family Medicine

## 2021-05-03 ENCOUNTER — Other Ambulatory Visit: Payer: Self-pay | Admitting: Family Medicine

## 2021-05-05 ENCOUNTER — Other Ambulatory Visit: Payer: Self-pay | Admitting: Family Medicine

## 2021-05-05 ENCOUNTER — Telehealth: Payer: Self-pay

## 2021-05-05 DIAGNOSIS — K862 Cyst of pancreas: Secondary | ICD-10-CM

## 2021-05-05 DIAGNOSIS — K8500 Idiopathic acute pancreatitis without necrosis or infection: Secondary | ICD-10-CM

## 2021-05-05 NOTE — Telephone Encounter (Signed)
-----   Message from [employer] sent at 10/28/2020 10:19 AM EDT ----- ?Patient needs CT in April- pancreas - f/u pancreatic cyst, then follow up appointment with Dr. Henrene Pastor ? ?

## 2021-05-05 NOTE — Telephone Encounter (Signed)
Lm on vm 

## 2021-05-06 ENCOUNTER — Ambulatory Visit (INDEPENDENT_AMBULATORY_CARE_PROVIDER_SITE_OTHER): Payer: Medicare PPO | Admitting: Family Medicine

## 2021-05-06 ENCOUNTER — Encounter: Payer: Self-pay | Admitting: Family Medicine

## 2021-05-06 VITALS — BP 163/76 | HR 59 | Temp 97.9°F | Ht 61.5 in | Wt 171.8 lb

## 2021-05-06 DIAGNOSIS — F411 Generalized anxiety disorder: Secondary | ICD-10-CM

## 2021-05-06 DIAGNOSIS — Z23 Encounter for immunization: Secondary | ICD-10-CM

## 2021-05-06 DIAGNOSIS — E7800 Pure hypercholesterolemia, unspecified: Secondary | ICD-10-CM

## 2021-05-06 DIAGNOSIS — Z79899 Other long term (current) drug therapy: Secondary | ICD-10-CM | POA: Diagnosis not present

## 2021-05-06 DIAGNOSIS — N1831 Chronic kidney disease, stage 3a: Secondary | ICD-10-CM | POA: Diagnosis not present

## 2021-05-06 DIAGNOSIS — I10 Essential (primary) hypertension: Secondary | ICD-10-CM | POA: Diagnosis not present

## 2021-05-06 LAB — COMPREHENSIVE METABOLIC PANEL
ALT: 24 U/L (ref 0–35)
AST: 21 U/L (ref 0–37)
Albumin: 4.2 g/dL (ref 3.5–5.2)
Alkaline Phosphatase: 78 U/L (ref 39–117)
BUN: 23 mg/dL (ref 6–23)
CO2: 28 mEq/L (ref 19–32)
Calcium: 9.4 mg/dL (ref 8.4–10.5)
Chloride: 105 mEq/L (ref 96–112)
Creatinine, Ser: 1.02 mg/dL (ref 0.40–1.20)
GFR: 50.61 mL/min — ABNORMAL LOW (ref >60.00)
Glucose, Bld: 91 mg/dL (ref 70–99)
Potassium: 4.4 mEq/L (ref 3.5–5.1)
Sodium: 142 mEq/L (ref 135–145)
Total Bilirubin: 0.9 mg/dL (ref 0.2–1.2)
Total Protein: 6.7 g/dL (ref 6.0–8.3)

## 2021-05-06 LAB — LIPID PANEL
Cholesterol: 159 mg/dL (ref 0–200)
HDL: 72.2 mg/dL (ref >39.00)
LDL Cholesterol: 72 mg/dL (ref 0–99)
NonHDL: 86.7
Total CHOL/HDL Ratio: 2
Triglycerides: 75 mg/dL (ref 0.0–149.0)
VLDL: 15 mg/dL (ref 0.0–40.0)

## 2021-05-06 MED ORDER — ALBUTEROL SULFATE HFA 108 (90 BASE) MCG/ACT IN AERS: INHALATION_SPRAY | RESPIRATORY_TRACT | 1 refills | Status: DC

## 2021-05-06 MED ORDER — AMLODIPINE BESYLATE 10 MG PO TABS: 10.0000 mg | ORAL_TABLET | Freq: Every day | ORAL | 1 refills | Status: DC

## 2021-05-06 MED ORDER — ZOSTER VAC RECOMB ADJUVANTED 50 MCG/0.5ML IM SUSR: 0.5000 mL | Freq: Once | INTRAMUSCULAR | 0 refills | Status: AC

## 2021-05-06 NOTE — Progress Notes (Signed)
Office Note ?05/06/2021 ? ?CC:  ?Chief Complaint  ?Patient presents with  ? Annual Exam  ?  Pt is fasting  ? ?Patient is a 86 y.o. female who is here for  f/u HTN, HLD, anxiety (high risk med use), and CRI III. ?A/P as of last visit: ?"1) Hypertension, well controlled.  Continue amlodipine 10 mg a day, hydralazine 50 mg 3 times daily, and Lopressor 100 mg twice daily. ?  ?2) CRI III: Okay to hold off on recheck of kidney function until next scheduled follow-up 03/02/2021.  We will check lipid panel at that time as well." ? ?INTERIM HX: ?Brenda Matthews is feeling well. ?She is anxious today because her blood pressure is up here. ?She has not taken her medication this morning yet.  She does note blood pressures 120s to 130s over 70s at home. ? ?She has diastolic dysfunction and PAF, followed by Dr. Debara Pickett.  She is on Xarelto Lopressor, and a daily dose of Lasix.  She has minimal lower extremity edema as long as she wears her compression stockings. ?Denies chest pain or palpitations or dizziness. ?She does sometimes get winded with being active and seems to resort to using her albuterol inhaler pretty quickly.  She has no wheezing.  No chest pain.  No nausea, diaphoresis, or arm pain. ? ?She takes alprazolam anywhere from 1-3 times a day for chronic anxiety.  Doing well with this but still maintains quite a high level of chronic worry, particularly about her health. ?PMP AWARE reviewed today: most recent rx for alprazolam was filled 05/03/2021, #90, rx by me. ?No red flags. ? ?ROS as above, plus--> no fevers, no cough,no HAs, no rashes, no melena/hematochezia.  No polyuria or polydipsia.  No myalgias or arthralgias.  No focal weakness, paresthesias, or tremors.  No acute vision or hearing abnormalities.  No dysuria or unusual/new urinary urgency or frequency.  No recent changes in lower legs. ?No n/v/d or abd pain.  No palpitations.   ? ?Past Medical History:  ?Diagnosis Date  ? Abnormal EKG approx 2008  ? Nuclear stress test  neg;   ? Acute pancreatitis 09/2020  ? idiopathic.  +panc psudocyst  ? Anxiety   ? with panic  ? CAP (community acquired pneumonia) 08/2017  ? Hospitalization for CAP/acute diast HF/rapid a-fib  ? Cataract   ? s/p surgery--lens implants  ? Chronic diastolic heart failure ([place]) 2019  ? Chronic renal insufficiency, stage 3 (moderate) ([place]) 12/04/2012  ? Renal u/s when in hosp 08/2017 for CAP/CHF showed symmetric kidneys, echogenicity normal, w/out hydronephrosis. Baseline GFR around 40 ml/min as of 10/2018.  ? DDD (degenerative disc disease), lumbar   ? Diverticulosis 2009  ? Fracture of radial shaft, left, closed 11/16/2010  ? fell down flight of stairs  ? History of kidney stones   ? Hx of adenomatous colonic polyps 2002;2009;2015  ? surveillance colonoscopy 2009, +polypectomy done-tubular adenoma w/out high grade.  05/2013 tubular adenomas--recall 3 yrs  ? Hyperlipidemia   ? Hypertension   ? Low TSH level 02/18/2016  ? T3 norm, T4 mildly elevated--suspected sick euthyroid syndrome.  Repeat labs 06/2016: normal.  ? Melanoma in situ ([place]) 06/2018  ? L LL  ? Osteoarthritis of both knees   ? viscosupplementation injections helpful 2020/21  ? Osteopenia   ? DEXA 08/2010; repeat DEXA 02/2015 worse: fosamax started.  06/2018 Dexa T score -2.4.  2020 maj osteop fx risk = 24%, Hip fx risk 7.3%. 09/2020 T score -2.1  ? PAF (paroxysmal atrial  fibrillation) ([place]) 02/2016  ? when in post-op for ankle surgery; spontaneously converted in hosp, seen by Dr. Debara Pickett in consultation--metoprolol rate control + xarelto recommended.  Metop d/c due to hypot.  Plan to cont xarelto 20 mg qd indef due to CHAD-VASc score of 3.  A-fib w/RVR and CHF 07/2017; pt placed on amiodarone and plan for CV, but pt was in sinus rhythm when she went in for her DC CV, so she was sent home.  ? Peripheral edema   ? Pneumonia 2015  ? hx with sepsis  ? Rheumatic fever   ? ? ?Past Surgical History:  ?Procedure Laterality Date  ? APPENDECTOMY  1966  ? done during surgery  for tubal pregnancy  ? CATARACT EXTRACTION W/ INTRAOCULAR LENS IMPLANT  2013  ? bilat  ? COLONOSCOPY W/ POLYPECTOMY  05/2013  ? +diverticulosis; recall 3 yrs (Dr. Henrene Pastor)  ? DEXA  02/2015; 06/2018  ? T score -2.1 in both femoral necks; FRAX 10 yr risk of major osteoporotic fracture was 21%---fosamax started. 06/2018 T score -2.4.  T score 09/2020 -2.1. Rpt 2 yrs.  ? ECTOPIC PREGNANCY SURGERY    ? EYE SURGERY    ? LEFT HEART CATH AND CORONARY ANGIOGRAPHY N/A 09/01/2017  ? No angiographically significant CAD.  Upper normal left ventricular filling pressure.  Procedure: LEFT HEART CATH AND CORONARY ANGIOGRAPHY;  Surgeon: Nelva Bush, MD;  Location: [facility];  Service: Cardiovascular;  Laterality: N/A;  ? LUMBAR LAMINECTOMY/DECOMPRESSION MICRODISCECTOMY N/A 09/26/2018  ? Procedure: Decompressive Lumbar Laminectomy L5 S1 FORAMINOTOMY L5 S1  NERVE ROOT BILATERALLY and Microdiscectomy L5-S1 Left;  Surgeon: Latanya Maudlin, MD;  Location: [facility];  Service: Orthopedics;  Laterality: N/A;  167mn  ? OPEN REDUCTION INTERNAL FIXATION (ORIF) TIBIA/FIBULA FRACTURE Left 02/18/2016  ? Procedure: OPEN REDUCTION INTERNAL FIXATION (ORIF) Right ankle trimalleolar fracture;  Surgeon: JWylene Simmer MD;  [facility]  Service: Orthopedics;  Laterality: Left;  requests 951ms  ? ORIF RADIAL FRACTURE  11/18/2010  ? left; s/p slip on slippery floor and fell  ? THORACENTESIS  08/2017  ? diagnostic and therapeutic.  Transudative.  Clx neg.  (+pulm edema/diastolic HF)  ? TONSILLECTOMY    ? TRANSTHORACIC ECHOCARDIOGRAM  02/18/2016; 08/10/17  ? LVEF of 55-60%, mild AI and mild MR and normal biatrial size.  07/2017--normal LV function, mild enlarge aortic root, mild/mod TR, bilat atrial enlargement.  ? ? ?Family History  ?Problem Relation Age of Onset  ? Heart disease Mother   ? Heart disease Father   ? Hypertension Brother   ? Diabetes Sister   ? Colon cancer Neg Hx   ? Pancreatic cancer Neg Hx   ? Rectal cancer Neg Hx   ? Stomach  cancer Neg Hx   ? ? ?Social History  ? ?Socioeconomic History  ? Marital status: Widowed  ?  Spouse name: Not on file  ? Number of children: Not on file  ? Years of education: Not on file  ? Highest education level: Not on file  ?Occupational History  ? Not on file  ?Tobacco Use  ? Smoking status: Never  ? Smokeless tobacco: Never  ?Vaping Use  ? Vaping Use: Never used  ?Substance and Sexual Activity  ? Alcohol use: Yes  ?  Comment: rarely  ? Drug use: Never  ? Sexual activity: Not on file  ?Other Topics Concern  ? Not on file  ?Social History Narrative  ? Widow, 2 sons.  ? 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  No tobacco.  Rare alcohol.  ? No drugs.  Exercise: 4 times per week, about 27m.  ? ?Social Determinants of Health  ? ?Financial Resource Strain: Low Risk   ? Difficulty of Paying Living Expenses: Not hard at all  ?Food Insecurity: No Food Insecurity  ? Worried About RCharity fundraiserin the Last Year: Never true  ? Ran Out of Food in the Last Year: Never true  ?Transportation Needs: No Transportation Needs  ? Lack of Transportation (Medical): No  ? Lack of Transportation (Non-Medical): No  ?Physical Activity: Insufficiently Active  ? Days of Exercise per Week: 3 days  ? Minutes of Exercise per Session: 30 min  ?Stress: Not on file  ?Social Connections: Moderately Integrated  ? Frequency of Communication with Friends and Family: More than three times a week  ? Frequency of Social Gatherings with Friends and Family: More than three times a week  ? Attends Religious Services: More than 4 times per year  ? Active Member of Clubs or Organizations: Yes  ? Attends CArchivistMeetings: More than 4 times per year  ? Marital Status: Widowed  ?Intimate Partner Violence: Not At Risk  ? Fear of Current or Ex-Partner: No  ? Emotionally Abused: No  ? Physically Abused: No  ? Sexually Abused: No  ? ? ?Outpatient Medications Prior to Visit  ?Medication Sig Dispense Refill  ? ALPRAZolam (XANAX) 0.5 MG tablet 1 tab po  tid prn anxiety 90 tablet 5  ? Apoaequorin (PREVAGEN PO) Take 1 tablet by mouth daily.    ? Ascorbic Acid (VITAMIN C PO) Take by mouth daily.    ? atorvastatin (LIPITOR) 40 MG tablet Take 1 tablet (40 m

## 2021-05-06 NOTE — Telephone Encounter (Signed)
Patient returned your call and asked that you call her back at your convenience.  ?

## 2021-05-06 NOTE — Progress Notes (Deleted)
Cardiology Clinic Note   Patient Name: Brenda Matthews Date of Encounter: 05/06/2021  Primary Care Provider:  [name], MD Primary Cardiologist:  [name], MD  Patient [place] 86 year old female presents to the clinic today for follow-up evaluation of her essential hypertension and paroxysmal atrial fibrillation.  Past Medical History    Past Medical History:  Diagnosis Date   Abnormal EKG approx 2008   Nuclear stress test neg;    Acute pancreatitis 09/2020   idiopathic.  +panc psudocyst   Anxiety    with panic   CAP (community acquired pneumonia) 08/2017   Hospitalization for CAP/acute diast HF/rapid a-fib   Cataract    s/p surgery--lens implants   Chronic diastolic heart failure ([place]) 2019   Chronic renal insufficiency, stage 3 (moderate) ([place]) 12/04/2012   Renal u/s when in hosp 08/2017 for CAP/CHF showed symmetric kidneys, echogenicity normal, w/out hydronephrosis. Baseline GFR around 40 ml/min as of 10/2018.   DDD (degenerative disc disease), lumbar    Diverticulosis 2009   Fracture of radial shaft, left, closed 11/16/2010   fell down flight of stairs   History of kidney stones    Hx of adenomatous colonic polyps 2002;2009;2015   surveillance colonoscopy 2009, +polypectomy done-tubular adenoma w/out high grade.  05/2013 tubular adenomas--recall 3 yrs   Hyperlipidemia    Hypertension    Low TSH level 02/18/2016   T3 norm, T4 mildly elevated--suspected sick euthyroid syndrome.  Repeat labs 06/2016: normal.   Melanoma in situ ([place]) 06/2018   L LL   Osteoarthritis of both knees    viscosupplementation injections helpful 2020/21   Osteopenia    DEXA 08/2010; repeat DEXA 02/2015 worse: fosamax started.  06/2018 Dexa T score -2.4.  2020 maj osteop fx risk = 24%, Hip fx risk 7.3%. 09/2020 T score -2.1   PAF (paroxysmal atrial fibrillation) ([place]) 02/2016   when in post-op for ankle surgery; spontaneously converted in hosp, seen by Dr. Debara Pickett in  consultation--metoprolol rate control + xarelto recommended.  Metop d/c due to hypot.  Plan to cont xarelto 20 mg qd indef due to CHAD-VASc score of 3.  A-fib w/RVR and CHF 07/2017; pt placed on amiodarone and plan for CV, but pt was in sinus rhythm when she went in for her DC CV, so she was sent home.   Peripheral edema    Pneumonia 2015   hx with sepsis   Rheumatic fever    Past Surgical History:  Procedure Laterality Date   APPENDECTOMY  1966   done during surgery for tubal pregnancy   CATARACT EXTRACTION W/ INTRAOCULAR LENS IMPLANT  2013   bilat   COLONOSCOPY W/ POLYPECTOMY  05/2013   +diverticulosis; recall 3 yrs (Dr. Henrene Pastor)   DEXA  02/2015; 06/2018   T score -2.1 in both femoral necks; FRAX 10 yr risk of major osteoporotic fracture was 21%---fosamax started. 06/2018 T score -2.4.  T score 09/2020 -2.1. Rpt 2 yrs.   ECTOPIC PREGNANCY SURGERY     EYE SURGERY     LEFT HEART CATH AND CORONARY ANGIOGRAPHY N/A 09/01/2017   No angiographically significant CAD.  Upper normal left ventricular filling pressure.  Procedure: LEFT HEART CATH AND CORONARY ANGIOGRAPHY;  Surgeon: Nelva Bush, MD;  Location: [facility];  Service: Cardiovascular;  Laterality: N/A;   LUMBAR LAMINECTOMY/DECOMPRESSION MICRODISCECTOMY N/A 09/26/2018   Procedure: Decompressive Lumbar Laminectomy L5 S1 FORAMINOTOMY L5 S1  NERVE ROOT BILATERALLY and Microdiscectomy L5-S1 Left;  Surgeon: Latanya Maudlin, MD;  Location: [facility];  Service: Orthopedics;  Laterality: N/A;  122mn   OPEN REDUCTION INTERNAL FIXATION (ORIF) TIBIA/FIBULA FRACTURE Left 02/18/2016   Procedure: OPEN REDUCTION INTERNAL FIXATION (ORIF) Right ankle trimalleolar fracture;  Surgeon: JWylene Simmer MD;  Location: [facility]  Service: Orthopedics;  Laterality: Left;  requests 965ms   ORIF RADIAL FRACTURE  11/18/2010   left; s/p slip on slippery floor and fell   THORACENTESIS  08/2017   diagnostic and therapeutic.  Transudative.  Clx neg.  (+pulm  edema/diastolic HF)   TONSILLECTOMY     TRANSTHORACIC ECHOCARDIOGRAM  02/18/2016; 08/10/17   LVEF of 55-60%, mild AI and mild MR and normal biatrial size.  07/2017--normal LV function, mild enlarge aortic root, mild/mod TR, bilat atrial enlargement.    Allergies  Allergies  Allergen Reactions   Augmentin [Amoxicillin-Pot Clavulanate] Nausea And Vomiting and Other (See Comments)    "projectile vomiting" Has patient had a PCN reaction causing immediate rash, facial/tongue/throat swelling, SOB or lightheadedness with hypotension:No Has patient had a PCN reaction causing severe rash involving mucus membranes or skin necrosis:No Has patient had a PCN reaction that required hospitalization:No Has patient had a PCN reaction occurring within the last 10 years:Yes If all of the above answers are "NO", then may proceed with Cephalosporin use.    Amoxicillin Rash   Clindamycin/Lincomycin Rash    History of Present Illness    HeSEBA MADOLEas a PMH of essential hypertension, paroxysmal atrial fibrillation, chronic diastolic CHF, acute bronchitis, acute respiratory failure, idiopathic acute pancreatitis, bilateral sciatica chronic renal insufficiency stage II, hyperlipidemia DOE, status postthoracentesis, and spinal stenosis.  She underwent cardiac catheterization 8/19 which showed normal coronary anatomy.  She was seen by Dr. HiDebara Pickett/18 for new onset atrial fibrillation with RVR, after induction with anesthesia during surgery for ankle fracture.  Her echocardiogram showed normal LV function.  Initially she was started on low-dose beta-blocker and Xarelto.  She was noted to have low blood pressure on beta-blocker therapy.  She converted to sinus rhythm on her own.  CHA2DS2-VASc score 3.  Her Xarelto was continued.  She had a recurrent episode of atrial fibrillation with RVR 7/19.  Her echocardiogram at that time showed an LVEF of 50-55% with normal wall motion and biatrial enlargement.  She was noted to  have mild-moderate TR.  She was started on amiodarone and outpatient DCCV was arranged.  When she presented for DCCV she was noted to be back in sinus rhythm.  She was admitted 8/19 with acute on chronic hypoxic respiratory failure secondary to pneumonia with sepsis and acute diastolic CHF.  She was noted to be back in atrial fibrillation with RVR.  Her troponins were elevated.  She underwent cardiac catheterization at that time which showed normal coronary anatomy.  She was seen by Dr. HiDebara Pickett/21 and was doing well from a cardiac standpoint.  Her amiodarone was discontinued.  She was admitted 09/14/2020 - 09/19/2020 for acute pancreatitis and pancreatic pseudocyst.  GI was consulted and she was treated conservatively.  She was hydrated and given pain control.  She was treated for acute UTI.  She had no acute cardiac issues during hospitalization.  [facility]  She was seen in follow-up by CaSande RivesPA-C 09/24/2020.  During that time she presented by herself.  She continued to do well from a cardiac standpoint.  She was noted to have chronically larger left lower leg/ankle.  She reported left  ankle injury.  She previously had used lower extremity compression stockings at [facility] which did not seem to help.  She did note some shortness of breath at rest.  She denied orthopnea and PND.  She did note occasional palpitations.  She did feel like she had occasional episodes where she would be back in atrial fibrillation however, these episodes would only last 2-4 minutes.  She denied lightheadedness dizziness and syncope.  She presents to the clinic today for follow-up evaluation and states***  *** denies chest pain, shortness of breath, lower extremity edema, fatigue, palpitations, melena, hematuria, hemoptysis, diaphoresis, weakness, presyncope, syncope, orthopnea, and PND.   Home Medications    Prior to Admission medications   Medication Sig Start  Date End Date Taking? Authorizing Provider  acetaminophen (TYLENOL) 650 MG CR tablet Take 650 mg by mouth in the morning and at bedtime.    [provider]  albuterol (VENTOLIN HFA) 108 (90 Base) MCG/ACT inhaler inhale 2 PUFFS by MOUTH into THE lungs EVERY 4 HOURS AS NEEDED FOR WHEEZING OR SHORTNESS OF BREATH 05/04/21   McGowen, Adrian Blackwater, MD  ALPRAZolam Duanne Moron) 0.5 MG tablet 1 tab po tid prn anxiety 04/05/21   McGowen, Adrian Blackwater, MD  amLODipine (NORVASC) 10 MG tablet TAKE 1 TABLET BY MOUTH EVERY DAY 04/29/21   McGowen, Adrian Blackwater, MD  Apoaequorin (PREVAGEN PO) Take 1 tablet by mouth daily.    [provider]  atorvastatin (LIPITOR) 40 MG tablet Take 1 tablet (40 mg total) by mouth daily. 08/27/20   McGowen, Adrian Blackwater, MD  Biotin 5000 MCG TABS Take 5,000 mcg by mouth daily.    [provider]  Calcium Carbonate (CALCIUM 600 PO) Take 1,200 mg by mouth daily.     [provider]  carboxymethylcellulose (REFRESH PLUS) 0.5 % SOLN Place 2 drops into both eyes daily as needed (dry/irritated eyes.).    [provider]  Coenzyme Q10 200 MG capsule Take 200 mg by mouth daily.    [provider]  fluticasone (FLONASE) 50 MCG/ACT nasal spray Place 2 sprays into both nostrils daily.     [provider]  furosemide (LASIX) 40 MG tablet Take 1 tablet (40 mg total) by mouth daily. 08/27/20   McGowen, Adrian Blackwater, MD  hydrALAZINE (APRESOLINE) 50 MG tablet Take 1 tablet (50 mg total) by mouth 3 (three) times daily. 08/27/20   McGowen, Adrian Blackwater, MD  metoprolol tartrate (LOPRESSOR) 100 MG tablet TAKE 1 TABLET BY MOUTH 2 TIMES DAILY 04/28/21   Hilty, Nadean Corwin, MD  Multiple Vitamins-Minerals (AIRBORNE PO) Take 1 tablet by mouth daily as needed (immune support).    [provider]  Multiple Vitamins-Minerals (CENTRUM SILVER ULTRA WOMENS) TABS Take 1 tablet by mouth every evening.    [provider]  potassium chloride SA (KLOR-CON) 20 MEQ tablet TAKE 1  TABLET BY MOUTH 2 TIMES DAILY 10/14/20   Hilty, Nadean Corwin, MD  Rivaroxaban (XARELTO) 15 MG TABS tablet TAKE 1 TABLET BY MOUTH DAILY AT [facility] 04/27/21   Hilty, Nadean Corwin, MD    Family History    Family History  Problem Relation Age of Onset   Heart disease Mother    Heart disease Father    Hypertension Brother    Diabetes Sister    Colon cancer Neg Hx    Pancreatic cancer Neg Hx    Rectal cancer Neg Hx    Stomach cancer Neg Hx    She indicated that her mother is deceased. She  indicated that her father is deceased. She indicated that her sister is deceased. She indicated that the status of her brother is unknown. She indicated that the status of her neg hx is unknown.  Social History    Social History   Socioeconomic History   Marital status: Widowed    Spouse name: Not on file   Number of children: Not on file   Years of education: Not on file   Highest education level: Not on file  Occupational History   Not on file  Tobacco Use   Smoking status: Never   Smokeless tobacco: Never  Vaping Use   Vaping Use: Never used  Substance and Sexual Activity   Alcohol use: Yes    Comment: rarely   Drug use: Never   Sexual activity: Not on file  Other Topics Concern   Not on file  Social History Narrative   Widow, 2 sons.   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   No tobacco.  Rare alcohol.   No drugs.  Exercise: 4 times per week, about 32m.   Social Determinants of Health   Financial Resource Strain: Low Risk    Difficulty of Paying Living Expenses: Not hard at all  Food Insecurity: No Food Insecurity   Worried About RCharity fundraiserin the Last Year: Never true   RWillowsin the Last Year: Never true  Transportation Needs: No Transportation Needs   Lack of Transportation (Medical): No   Lack of Transportation (Non-Medical): No  Physical Activity: Insufficiently Active   Days of Exercise per Week: 3 days   Minutes of Exercise per Session: 30 min  Stress: Not on file   Social Connections: Moderately Integrated   Frequency of Communication with Friends and Family: More than three times a week   Frequency of Social Gatherings with Friends and Family: More than three times a week   Attends Religious Services: More than 4 times per year   Active Member of [employer] Organizations: Yes   Attends CArchivistMeetings: More than 4 times per year   Marital Status: Widowed  IHuman resources officerViolence: Not At Risk   Fear of Current or Ex-Partner: No   Emotionally Abused: No   Physically Abused: No   Sexually Abused: No     Review of Systems    General:  No chills, fever, night sweats or weight changes.  Cardiovascular:  No chest pain, dyspnea on exertion, edema, orthopnea, palpitations, paroxysmal nocturnal dyspnea. Dermatological: No rash, lesions/masses Respiratory: No cough, dyspnea Urologic: No hematuria, dysuria Abdominal:   No nausea, vomiting, diarrhea, bright red blood per rectum, melena, or hematemesis Neurologic:  No visual changes, wkns, changes in mental status. All other systems reviewed and are otherwise negative except as noted above.  Physical Exam    VS:  LMP  (LMP Unknown)  , BMI There is no height or weight on file to calculate BMI. GEN: Well nourished, well developed, in no acute distress. HEENT: normal. Neck: Supple, no JVD, carotid bruits, or masses. Cardiac: RRR, no murmurs, rubs, or gallops. No clubbing, cyanosis, edema.  Radials/DP/PT 2+ and equal bilaterally.  Respiratory:  Respirations regular and unlabored, clear to auscultation bilaterally. GI: Soft, nontender, nondistended, BS + x 4. MS: no deformity or atrophy. Skin: warm and dry, no rash. Neuro:  Strength and sensation are intact. Psych: Normal affect.  Accessory Clinical Findings    Recent Labs: 09/18/2020: Magnesium 2.0 09/24/2020: Hemoglobin 11.7; NT-Pro BNP 2,759; Platelets 460 10/28/2020:  ALT 15; BUN 21; Creatinine, Ser 1.14; Potassium 3.9; Sodium 144    Recent Lipid Panel    Component Value Date/Time   CHOL 126 08/27/2020 0934   TRIG 152.0 (H) 08/27/2020 0934   HDL 42.60 08/27/2020 0934   CHOLHDL 3 08/27/2020 0934   VLDL 30.4 08/27/2020 0934   LDLCALC 53 08/27/2020 0934   LDLDIRECT 66.0 09/10/2018 0913    ECG personally reviewed by me today- *** - No acute changes  EKG 09/24/20 Normal sinus rhythm, 73 bpm, PAC, LVH, poor R wave progression and possible Q waves in pericardial leads, isolated T wave inversions in lead III, left axis deviation, QTc 434 ms   Echocardiogram 08/10/2017  Study Conclusions   - Left ventricle: The cavity size was normal. Wall thickness was    increased in a pattern of mild LVH. Systolic function was normal.    The estimated ejection fraction was in the range of 50% to 55%.    Wall motion was normal; there were no regional wall motion    abnormalities.  - Aortic valve: There was trivial regurgitation.  - Aortic root: The aortic root was mildly dilated.  - Mitral valve: There was mild regurgitation.  - Left atrium: The atrium was severely dilated.  - Right atrium: The atrium was mildly dilated.  - Tricuspid valve: There was mild-moderate regurgitation.  - Pulmonary arteries: PA peak pressure: 32 mm Hg (S).  - Pericardium, extracardiac: A trivial pericardial effusion was    identified.   Impressions:   - Normal LV systolic function; mild LVH; trace AI; mildly dilated    aortic root; mild MR; biatrial enlargement; mild to moderate TR.  Cardiac catheterization 09/01/2017 Conclusions: No angiographically significant coronary artery disease. Upper normal left ventricular filling pressure.   Recommendations: Continue medical therapy of diastolic heart failure and atrial fibrillation. Gentle post-catheterization hydration given resolved acute kidney injury. Restart heparin infusion 2 hours after TR band removal; rivaroxaban could be restarted as soon as tomorrow.   Recommend to resume Rivaroxaban,  at currently prescribed dose and frequency, on 09/01/17.  Concurrent antiplatelet therapy not recommended.    Nelva Bush, MD [facility] Pager: [phone]  Diagnostic Dominance: Right Intervention   Assessment & Plan   1.  Chronic diastolic CHF-euvolemic today.  Weight stable.  No increased DOE activity intolerance. Continue furosemide Heart healthy low-sodium diet-salty 6 given Increase physical activity as tolerated Daily weights-contact office with a weight increase of 3 pounds overnight or 5 pounds in 1 week BMP 05/06/2021 stable  Paroxysmal atrial fibrillation-EKG today shows***.  Continues to report occasional episodes of palpitations.  Episodes last minutes and then dissipated on their own without intervention. Continue metoprolol, Xarelto Avoid triggers caffeine, chocolate, EtOH, dehydration etc. Order CBC  Essential hypertension-BP today***.  Well-controlled at home. Continue amlodipine, Toprol, hydralazine metoprolol Heart healthy low-sodium diet-salty 6 given Increase physical activity as tolerated  Hyperlipidemia-05/06/2021: Cholesterol 159; HDL 72.20; LDL Cholesterol 72; Triglycerides 75.0; VLDL 15.0 Continue atorvastatin Heart healthy low-sodium high-fiber diet Increase physical activity as tolerated  CKD stage III-baseline creatinine 0.86-1.14 Follows with PCP   Follow-up with Dr. Debara Pickett or me in 6 months.    Jossie Ng. Aedan Geimer NP-C    05/06/2021, 7:35 AM [place] [address] Office [phone] Fax [phone]  Notice: This dictation was prepared with Dragon dictation along with smaller phrase technology. Any transcriptional errors that result from this process are unintentional and may not be corrected upon review.  I spent***minutes  examining this patient, reviewing medications, and using patient centered shared decision making involving her cardiac care.  Prior to her visit I spent greater than 20  minutes reviewing her past medical history,  medications, and prior cardiac tests.

## 2021-05-10 ENCOUNTER — Ambulatory Visit: Payer: Medicare PPO | Admitting: General Practice

## 2021-05-11 LAB — DM TEMPLATE

## 2021-05-11 LAB — TIQ-MISC

## 2021-05-11 LAB — DRUG MONITORING PANEL 376104, URINE

## 2021-05-12 NOTE — Telephone Encounter (Signed)
Spoke with patient and scheduled Ct of the abdomen to look at pancreas on 05/20/2021 at 1:30pm.  We scheduled a follow up appointment with Dr. Henrene Pastor to discuss on 06/09/2021.  Patient aware of location and time of both appointments ?

## 2021-05-20 ENCOUNTER — Ambulatory Visit (INDEPENDENT_AMBULATORY_CARE_PROVIDER_SITE_OTHER)
Admission: RE | Admit: 2021-05-20 | Discharge: 2021-05-20 | Disposition: A | Discharge status: Home / Self Care | Payer: Medicare PPO | Source: Ambulatory Visit | Attending: Internal Medicine | Admitting: Internal Medicine

## 2021-05-20 DIAGNOSIS — K7689 Other specified diseases of liver: Secondary | ICD-10-CM | POA: Diagnosis not present

## 2021-05-20 DIAGNOSIS — N281 Cyst of kidney, acquired: Secondary | ICD-10-CM | POA: Diagnosis not present

## 2021-05-20 DIAGNOSIS — K862 Cyst of pancreas: Secondary | ICD-10-CM

## 2021-05-20 DIAGNOSIS — K8500 Idiopathic acute pancreatitis without necrosis or infection: Secondary | ICD-10-CM | POA: Diagnosis not present

## 2021-05-20 DIAGNOSIS — M47816 Spondylosis without myelopathy or radiculopathy, lumbar region: Secondary | ICD-10-CM | POA: Diagnosis not present

## 2021-05-20 DIAGNOSIS — K863 Pseudocyst of pancreas: Secondary | ICD-10-CM | POA: Diagnosis not present

## 2021-05-20 MED ORDER — IOHEXOL 300 MG/ML  SOLN
100.0000 mL | Freq: Once | INTRAMUSCULAR | Status: AC | PRN
  Administered 2021-05-20: 100 mL via INTRAVENOUS

## 2021-05-29 NOTE — Progress Notes (Deleted)
Cardiology Clinic Note   Patient Name: Brenda Matthews Date of Encounter: 05/29/2021  Primary Care Provider:  [name], MD Primary Cardiologist:  [name], MD  Patient [place] 86 year old female presents to the clinic today for follow-up evaluation of her essential hypertension and paroxysmal atrial fibrillation.  Past Medical History    Past Medical History:  Diagnosis Date   Abnormal EKG approx 2008   Nuclear stress test neg;    Acute pancreatitis 09/2020   idiopathic.  +panc psudocyst   Anxiety    with panic   CAP (community acquired pneumonia) 08/2017   Hospitalization for CAP/acute diast HF/rapid a-fib   Cataract    s/p surgery--lens implants   Chronic diastolic heart failure ([place]) 2019   Chronic renal insufficiency, stage 3 (moderate) ([place]) 12/04/2012   Renal u/s when in hosp 08/2017 for CAP/CHF showed symmetric kidneys, echogenicity normal, w/out hydronephrosis. Baseline GFR around 40 ml/min as of 10/2018.   DDD (degenerative disc disease), lumbar    Diverticulosis 2009   Fracture of radial shaft, left, closed 11/16/2010   fell down flight of stairs   History of kidney stones    Hx of adenomatous colonic polyps 2002;2009;2015   surveillance colonoscopy 2009, +polypectomy done-tubular adenoma w/out high grade.  05/2013 tubular adenomas--recall 3 yrs   Hyperlipidemia    Hypertension    Low TSH level 02/18/2016   T3 norm, T4 mildly elevated--suspected sick euthyroid syndrome.  Repeat labs 06/2016: normal.   Melanoma in situ ([place]) 06/2018   L LL   Osteoarthritis of both knees    viscosupplementation injections helpful 2020/21   Osteopenia    DEXA 08/2010; repeat DEXA 02/2015 worse: fosamax started.  06/2018 Dexa T score -2.4.  2020 maj osteop fx risk = 24%, Hip fx risk 7.3%. 09/2020 T score -2.1   PAF (paroxysmal atrial fibrillation) ([place]) 02/2016   when in post-op for ankle surgery; spontaneously converted in hosp, seen by Dr. Debara Pickett in  consultation--metoprolol rate control + xarelto recommended.  Metop d/c due to hypot.  Plan to cont xarelto 20 mg qd indef due to CHAD-VASc score of 3.  A-fib w/RVR and CHF 07/2017; pt placed on amiodarone and plan for CV, but pt was in sinus rhythm when she went in for her DC CV, so she was sent home.   Peripheral edema    Pneumonia 2015   hx with sepsis   Rheumatic fever    Past Surgical History:  Procedure Laterality Date   APPENDECTOMY  1966   done during surgery for tubal pregnancy   CATARACT EXTRACTION W/ INTRAOCULAR LENS IMPLANT  2013   bilat   COLONOSCOPY W/ POLYPECTOMY  05/2013   +diverticulosis; recall 3 yrs (Dr. Henrene Pastor)   DEXA  02/2015; 06/2018   T score -2.1 in both femoral necks; FRAX 10 yr risk of major osteoporotic fracture was 21%---fosamax started. 06/2018 T score -2.4.  T score 09/2020 -2.1. Rpt 2 yrs.   ECTOPIC PREGNANCY SURGERY     EYE SURGERY     LEFT HEART CATH AND CORONARY ANGIOGRAPHY N/A 09/01/2017   No angiographically significant CAD.  Upper normal left ventricular filling pressure.  Procedure: LEFT HEART CATH AND CORONARY ANGIOGRAPHY;  Surgeon: Nelva Bush, MD;  Location: [facility];  Service: Cardiovascular;  Laterality: N/A;   LUMBAR LAMINECTOMY/DECOMPRESSION MICRODISCECTOMY N/A 09/26/2018   Procedure: Decompressive Lumbar Laminectomy L5 S1 FORAMINOTOMY L5 S1  NERVE ROOT BILATERALLY and Microdiscectomy L5-S1 Left;  Surgeon: Latanya Maudlin, MD;  Location: [facility];  Service: Orthopedics;  Laterality: N/A;  137mn   OPEN REDUCTION INTERNAL FIXATION (ORIF) TIBIA/FIBULA FRACTURE Left 02/18/2016   Procedure: OPEN REDUCTION INTERNAL FIXATION (ORIF) Right ankle trimalleolar fracture;  Surgeon: JWylene Simmer MD;  Location: [facility]  Service: Orthopedics;  Laterality: Left;  requests 983ms   ORIF RADIAL FRACTURE  11/18/2010   left; s/p slip on slippery floor and fell   THORACENTESIS  08/2017   diagnostic and therapeutic.  Transudative.  Clx neg.  (+pulm  edema/diastolic HF)   TONSILLECTOMY     TRANSTHORACIC ECHOCARDIOGRAM  02/18/2016; 08/10/17   LVEF of 55-60%, mild AI and mild MR and normal biatrial size.  07/2017--normal LV function, mild enlarge aortic root, mild/mod TR, bilat atrial enlargement.    Allergies  Allergies  Allergen Reactions   Augmentin [Amoxicillin-Pot Clavulanate] Nausea And Vomiting and Other (See Comments)    "projectile vomiting" Has patient had a PCN reaction causing immediate rash, facial/tongue/throat swelling, SOB or lightheadedness with hypotension:No Has patient had a PCN reaction causing severe rash involving mucus membranes or skin necrosis:No Has patient had a PCN reaction that required hospitalization:No Has patient had a PCN reaction occurring within the last 10 years:Yes If all of the above answers are "NO", then may proceed with Cephalosporin use.    Amoxicillin Rash   Clindamycin/Lincomycin Rash    History of Present Illness    HeDARIONA POSTMAas a PMH of essential hypertension, paroxysmal atrial fibrillation, chronic diastolic CHF, acute bronchitis, acute respiratory failure, idiopathic acute pancreatitis, bilateral sciatica chronic renal insufficiency stage II, hyperlipidemia DOE, status postthoracentesis, and spinal stenosis.  She underwent cardiac catheterization 8/19 which showed normal coronary anatomy.  She was seen by Dr. HiDebara Pickett/18 for new onset atrial fibrillation with RVR, after induction with anesthesia during surgery for ankle fracture.  Her echocardiogram showed normal LV function.  Initially she was started on low-dose beta-blocker and Xarelto.  She was noted to have low blood pressure on beta-blocker therapy.  She converted to sinus rhythm on her own.  CHA2DS2-VASc score 3.  Her Xarelto was continued.  She had a recurrent episode of atrial fibrillation with RVR 7/19.  Her echocardiogram at that time showed an LVEF of 50-55% with normal wall motion and biatrial enlargement.  She was noted to  have mild-moderate TR.  She was started on amiodarone and outpatient DCCV was arranged.  When she presented for DCCV she was noted to be back in sinus rhythm.  She was admitted 8/19 with acute on chronic hypoxic respiratory failure secondary to pneumonia with sepsis and acute diastolic CHF.  She was noted to be back in atrial fibrillation with RVR.  Her troponins were elevated.  She underwent cardiac catheterization at that time which showed normal coronary anatomy.  She was seen by Dr. HiDebara Pickett/21 and was doing well from a cardiac standpoint.  Her amiodarone was discontinued.  She was admitted 09/14/2020 - 09/19/2020 for acute pancreatitis and pancreatic pseudocyst.  GI was consulted and she was treated conservatively.  She was hydrated and given pain control.  She was treated for acute UTI.  She had no acute cardiac issues during hospitalization.  [facility]  She was seen in follow-up by CaSande RivesPA-C 09/24/2020.  During that time she presented by herself.  She continued to do well from a cardiac standpoint.  She was noted to have chronically larger left lower leg/ankle.  She reported left  ankle injury.  She previously had used lower extremity compression stockings at [facility] which did not seem to help.  She did note some shortness of breath at rest.  She denied orthopnea and PND.  She did note occasional palpitations.  She did feel like she had occasional episodes where she would be back in atrial fibrillation however, these episodes would only last 2-4 minutes.  She denied lightheadedness dizziness and syncope.  She presents to the clinic today for follow-up evaluation and states***  *** denies chest pain, shortness of breath, lower extremity edema, fatigue, palpitations, melena, hematuria, hemoptysis, diaphoresis, weakness, presyncope, syncope, orthopnea, and PND.   Home Medications    Prior to Admission medications   Medication Sig Start  Date End Date Taking? Authorizing Provider  acetaminophen (TYLENOL) 650 MG CR tablet Take 650 mg by mouth in the morning and at bedtime.    [provider]  albuterol (VENTOLIN HFA) 108 (90 Base) MCG/ACT inhaler inhale 2 PUFFS by MOUTH into THE lungs EVERY 4 HOURS AS NEEDED FOR WHEEZING OR SHORTNESS OF BREATH 05/04/21   McGowen, Adrian Blackwater, MD  ALPRAZolam Duanne Moron) 0.5 MG tablet 1 tab po tid prn anxiety 04/05/21   McGowen, Adrian Blackwater, MD  amLODipine (NORVASC) 10 MG tablet TAKE 1 TABLET BY MOUTH EVERY DAY 04/29/21   McGowen, Adrian Blackwater, MD  Apoaequorin (PREVAGEN PO) Take 1 tablet by mouth daily.    [provider]  atorvastatin (LIPITOR) 40 MG tablet Take 1 tablet (40 mg total) by mouth daily. 08/27/20   McGowen, Adrian Blackwater, MD  Biotin 5000 MCG TABS Take 5,000 mcg by mouth daily.    [provider]  Calcium Carbonate (CALCIUM 600 PO) Take 1,200 mg by mouth daily.     [provider]  carboxymethylcellulose (REFRESH PLUS) 0.5 % SOLN Place 2 drops into both eyes daily as needed (dry/irritated eyes.).    [provider]  Coenzyme Q10 200 MG capsule Take 200 mg by mouth daily.    [provider]  fluticasone (FLONASE) 50 MCG/ACT nasal spray Place 2 sprays into both nostrils daily.     [provider]  furosemide (LASIX) 40 MG tablet Take 1 tablet (40 mg total) by mouth daily. 08/27/20   McGowen, Adrian Blackwater, MD  hydrALAZINE (APRESOLINE) 50 MG tablet Take 1 tablet (50 mg total) by mouth 3 (three) times daily. 08/27/20   McGowen, Adrian Blackwater, MD  metoprolol tartrate (LOPRESSOR) 100 MG tablet TAKE 1 TABLET BY MOUTH 2 TIMES DAILY 04/28/21   Hilty, Nadean Corwin, MD  Multiple Vitamins-Minerals (AIRBORNE PO) Take 1 tablet by mouth daily as needed (immune support).    [provider]  Multiple Vitamins-Minerals (CENTRUM SILVER ULTRA WOMENS) TABS Take 1 tablet by mouth every evening.    [provider]  potassium chloride SA (KLOR-CON) 20 MEQ tablet TAKE 1  TABLET BY MOUTH 2 TIMES DAILY 10/14/20   Hilty, Nadean Corwin, MD  Rivaroxaban (XARELTO) 15 MG TABS tablet TAKE 1 TABLET BY MOUTH DAILY AT [facility] 04/27/21   Hilty, Nadean Corwin, MD    Family History    Family History  Problem Relation Age of Onset   Heart disease Mother    Heart disease Father    Hypertension Brother    Diabetes Sister    Colon cancer Neg Hx    Pancreatic cancer Neg Hx    Rectal cancer Neg Hx    Stomach cancer Neg Hx    She indicated that her mother is deceased. She  indicated that her father is deceased. She indicated that her sister is deceased. She indicated that the status of her brother is unknown. She indicated that the status of her neg hx is unknown.  Social History    Social History   Socioeconomic History   Marital status: Widowed    Spouse name: Not on file   Number of children: Not on file   Years of education: Not on file   Highest education level: Not on file  Occupational History   Not on file  Tobacco Use   Smoking status: Never   Smokeless tobacco: Never  Vaping Use   Vaping Use: Never used  Substance and Sexual Activity   Alcohol use: Yes    Comment: rarely   Drug use: Never   Sexual activity: Not on file  Other Topics Concern   Not on file  Social History Narrative   Widow, 2 sons.   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   No tobacco.  Rare alcohol.   No drugs.  Exercise: 4 times per week, about 38m.   Social Determinants of Health   Financial Resource Strain: Low Risk    Difficulty of Paying Living Expenses: Not hard at all  Food Insecurity: No Food Insecurity   Worried About RCharity fundraiserin the Last Year: Never true   R[place]ham[place]in the Last Year: Never true  Transportation Needs: No Transportation Needs   Lack of Transportation (Medical): No   Lack of Transportation (Non-Medical): No  Physical Activity: Insufficiently Active   Days of Exercise per Week: 3 days   Minutes of Exercise per Session: 30 min  Stress: Not on file   Social Connections: Moderately Integrated   Frequency of Communication with Friends and Family: More than three times a week   Frequency of Social Gatherings with Friends and Family: More than three times a week   Attends Religious Services: More than 4 times per year   Active Member of [employer] Organizations: Yes   Attends CArchivistMeetings: More than 4 times per year   Marital Status: Widowed  IHuman resources officerViolence: Not At Risk   Fear of Current or Ex-Partner: No   Emotionally Abused: No   Physically Abused: No   Sexually Abused: No     Review of Systems    General:  No chills, fever, night sweats or weight changes.  Cardiovascular:  No chest pain, dyspnea on exertion, edema, orthopnea, palpitations, paroxysmal nocturnal dyspnea. Dermatological: No rash, lesions/masses Respiratory: No cough, dyspnea Urologic: No hematuria, dysuria Abdominal:   No nausea, vomiting, diarrhea, bright red blood per rectum, melena, or hematemesis Neurologic:  No visual changes, wkns, changes in mental status. All other systems reviewed and are otherwise negative except as noted above.  Physical Exam    VS:  LMP  (LMP Unknown)  , BMI There is no height or weight on file to calculate BMI. GEN: Well nourished, well developed, in no acute distress. HEENT: normal. Neck: Supple, no JVD, carotid bruits, or masses. Cardiac: RRR, no murmurs, rubs, or gallops. No clubbing, cyanosis, edema.  Radials/DP/PT 2+ and equal bilaterally.  Respiratory:  Respirations regular and unlabored, clear to auscultation bilaterally. GI: Soft, nontender, nondistended, BS + x 4. MS: no deformity or atrophy. Skin: warm and dry, no rash. Neuro:  Strength and sensation are intact. Psych: Normal affect.  Accessory Clinical Findings    Recent Labs: 09/18/2020: Magnesium 2.0 09/24/2020: Hemoglobin 11.7; NT-Pro BNP 2,759; Platelets 460 05/06/2021:  ALT 24; BUN 23; Creatinine, Ser 1.02; Potassium 4.4; Sodium 142    Recent Lipid Panel    Component Value Date/Time   CHOL 159 05/06/2021 0936   TRIG 75.0 05/06/2021 0936   HDL 72.20 05/06/2021 0936   CHOLHDL 2 05/06/2021 0936   VLDL 15.0 05/06/2021 0936   LDLCALC 72 05/06/2021 0936   LDLDIRECT 66.0 09/10/2018 0913    ECG personally reviewed by me today- *** - No acute changes  EKG 09/24/20 Normal sinus rhythm, 73 bpm, PAC, LVH, poor R wave progression and possible Q waves in pericardial leads, isolated T wave inversions in lead III, left axis deviation, QTc 434 ms   Echocardiogram 08/10/2017  Study Conclusions   - Left ventricle: The cavity size was normal. Wall thickness was    increased in a pattern of mild LVH. Systolic function was normal.    The estimated ejection fraction was in the range of 50% to 55%.    Wall motion was normal; there were no regional wall motion    abnormalities.  - Aortic valve: There was trivial regurgitation.  - Aortic root: The aortic root was mildly dilated.  - Mitral valve: There was mild regurgitation.  - Left atrium: The atrium was severely dilated.  - Right atrium: The atrium was mildly dilated.  - Tricuspid valve: There was mild-moderate regurgitation.  - Pulmonary arteries: PA peak pressure: 32 mm Hg (S).  - Pericardium, extracardiac: A trivial pericardial effusion was    identified.   Impressions:   - Normal LV systolic function; mild LVH; trace AI; mildly dilated    aortic root; mild MR; biatrial enlargement; mild to moderate TR.  Cardiac catheterization 09/01/2017 Conclusions: No angiographically significant coronary artery disease. Upper normal left ventricular filling pressure.   Recommendations: Continue medical therapy of diastolic heart failure and atrial fibrillation. Gentle post-catheterization hydration given resolved acute kidney injury. Restart heparin infusion 2 hours after TR band removal; rivaroxaban could be restarted as soon as tomorrow.   Recommend to resume Rivaroxaban, at  currently prescribed dose and frequency, on 09/01/17.  Concurrent antiplatelet therapy not recommended.    Nelva Bush, MD [facility] Pager: [phone]  Diagnostic Dominance: Right Intervention   Assessment & Plan   1.  Chronic diastolic CHF-euvolemic today.  Weight stable.  No increased DOE activity intolerance. Continue furosemide Heart healthy low-sodium diet-salty 6 given Increase physical activity as tolerated Daily weights-contact office with a weight increase of 3 pounds overnight or 5 pounds in 1 week BMP 05/06/2021 stable  Paroxysmal atrial fibrillation-EKG today shows***.  Continues to report occasional episodes of palpitations.  Episodes last minutes and then dissipated on their own without intervention. Continue metoprolol, Xarelto Avoid triggers caffeine, chocolate, EtOH, dehydration etc. Order CBC  Essential hypertension-BP today***.  Well-controlled at home. Continue amlodipine, Toprol, hydralazine metoprolol Heart healthy low-sodium diet-salty 6 given Increase physical activity as tolerated  Hyperlipidemia-05/06/2021: Cholesterol 159; HDL 72.20; LDL Cholesterol 72; Triglycerides 75.0; VLDL 15.0 Continue atorvastatin Heart healthy low-sodium high-fiber diet Increase physical activity as tolerated  CKD stage III-baseline creatinine 0.86-1.14 Follows with PCP   Follow-up with Dr. Debara Pickett or me in 6 months.    Jossie Ng. Arieonna Medine NP-C    05/29/2021, 6:45 PM [facility] [address] [address] Office [phone] Fax [phone]  Notice: This dictation was prepared with Dragon dictation along with smaller phrase technology. Any transcriptional errors that result from this process are unintentional and may not be corrected upon review.  I spent***minutes examining  this patient, reviewing medications, and using patient centered shared decision making involving her cardiac care.  Prior to her visit I spent greater than 20  minutes reviewing her past medical history,  medications, and prior cardiac tests.

## 2021-05-31 ENCOUNTER — Ambulatory Visit: Payer: Medicare PPO | Admitting: General Practice

## 2021-06-09 ENCOUNTER — Ambulatory Visit: Payer: Medicare PPO | Admitting: Internal Medicine

## 2021-06-10 ENCOUNTER — Ambulatory Visit: Payer: Medicare PPO | Admitting: Nurse Practitioner

## 2021-06-18 ENCOUNTER — Other Ambulatory Visit: Payer: Self-pay | Admitting: Family Medicine

## 2021-07-02 ENCOUNTER — Ambulatory Visit: Payer: Medicare PPO | Admitting: Nurse Practitioner

## 2021-07-02 NOTE — Progress Notes (Deleted)
Office Visit    Patient Name: Brenda Matthews Date of Encounter: 07/02/2021  Primary Care Provider:  [name], MD Primary Cardiologist:  [name], MD  Chief Complaint    86 year old female with a history of normal coronary arteries on cardiac catheterization in 08/2017, paroxysmal atrial fibrillation, chronic diastolic heart failure, hypertension, hyperlipidemia, hypothyroidism, and CKD stage III who presents for follow-up related to atrial fibrillation and heart failure.  Past Medical History    Past Medical History:  Diagnosis Date   Abnormal EKG approx 2008   Nuclear stress test neg;    Acute pancreatitis 09/2020   idiopathic.  +panc psudocyst   Anxiety    with panic   CAP (community acquired pneumonia) 08/2017   Hospitalization for CAP/acute diast HF/rapid a-fib   Cataract    s/p surgery--lens implants   Chronic diastolic heart failure ([place]) 2019   Chronic renal insufficiency, stage 3 (moderate) ([place]) 12/04/2012   Renal u/s when in hosp 08/2017 for CAP/CHF showed symmetric kidneys, echogenicity normal, w/out hydronephrosis. Baseline GFR around 40 ml/min as of 10/2018.   DDD (degenerative disc disease), lumbar    Diverticulosis 2009   Fracture of radial shaft, left, closed 11/16/2010   fell down flight of stairs   History of kidney stones    Hx of adenomatous colonic polyps 2002;2009;2015   surveillance colonoscopy 2009, +polypectomy done-tubular adenoma w/out high grade.  05/2013 tubular adenomas--recall 3 yrs   Hyperlipidemia    Hypertension    Low TSH level 02/18/2016   T3 norm, T4 mildly elevated--suspected sick euthyroid syndrome.  Repeat labs 06/2016: normal.   Melanoma in situ ([place]) 06/2018   L LL   Osteoarthritis of both knees    viscosupplementation injections helpful 2020/21   Osteopenia    DEXA 08/2010; repeat DEXA 02/2015 worse: fosamax started.  06/2018 Dexa T score -2.4.  2020 maj osteop fx risk = 24%, Hip fx risk 7.3%. 09/2020 T score -2.1    PAF (paroxysmal atrial fibrillation) ([place]) 02/2016   when in post-op for ankle surgery; spontaneously converted in hosp, seen by Dr. Debara Pickett in consultation--metoprolol rate control + xarelto recommended.  Metop d/c due to hypot.  Plan to cont xarelto 20 mg qd indef due to CHAD-VASc score of 3.  A-fib w/RVR and CHF 07/2017; pt placed on amiodarone and plan for CV, but pt was in sinus rhythm when she went in for her DC CV, so she was sent home.   Peripheral edema    Pneumonia 2015   hx with sepsis   Rheumatic fever    Past Surgical History:  Procedure Laterality Date   APPENDECTOMY  1966   done during surgery for tubal pregnancy   CATARACT EXTRACTION W/ INTRAOCULAR LENS IMPLANT  2013   bilat   COLONOSCOPY W/ POLYPECTOMY  05/2013   +diverticulosis; recall 3 yrs (Dr. Henrene Pastor)   DEXA  02/2015; 06/2018   T score -2.1 in both femoral necks; FRAX 10 yr risk of major osteoporotic fracture was 21%---fosamax started. 06/2018 T score -2.4.  T score 09/2020 -2.1. Rpt 2 yrs.   ECTOPIC PREGNANCY SURGERY     EYE SURGERY     LEFT HEART CATH AND CORONARY ANGIOGRAPHY N/A 09/01/2017   No angiographically significant CAD.  Upper normal left ventricular filling pressure.  Procedure: LEFT HEART CATH AND CORONARY ANGIOGRAPHY;  Surgeon: Nelva Bush, MD;  Location: [facility];  Service: Cardiovascular;  Laterality: N/A;   LUMBAR LAMINECTOMY/DECOMPRESSION MICRODISCECTOMY N/A 09/26/2018  Procedure: Decompressive Lumbar Laminectomy L5 S1 FORAMINOTOMY L5 S1  NERVE ROOT BILATERALLY and Microdiscectomy L5-S1 Left;  Surgeon: Latanya Maudlin, MD;  Location: [facility];  Service: Orthopedics;  Laterality: N/A;  143mn   OPEN REDUCTION INTERNAL FIXATION (ORIF) TIBIA/FIBULA FRACTURE Left 02/18/2016   Procedure: OPEN REDUCTION INTERNAL FIXATION (ORIF) Right ankle trimalleolar fracture;  Surgeon: JWylene Simmer MD;  Location: [facility]  Service: Orthopedics;  Laterality: Left;  requests 931ms   ORIF RADIAL FRACTURE  11/18/2010    left; s/p slip on slippery floor and fell   THORACENTESIS  08/2017   diagnostic and therapeutic.  Transudative.  Clx neg.  (+pulm edema/diastolic HF)   TONSILLECTOMY     TRANSTHORACIC ECHOCARDIOGRAM  02/18/2016; 08/10/17   LVEF of 55-60%, mild AI and mild MR and normal biatrial size.  07/2017--normal LV function, mild enlarge aortic root, mild/mod TR, bilat atrial enlargement.    Allergies  Allergies  Allergen Reactions   Augmentin [Amoxicillin-Pot Clavulanate] Nausea And Vomiting and Other (See Comments)    "projectile vomiting" Has patient had a PCN reaction causing immediate rash, facial/tongue/throat swelling, SOB or lightheadedness with hypotension:No Has patient had a PCN reaction causing severe rash involving mucus membranes or skin necrosis:No Has patient had a PCN reaction that required hospitalization:No Has patient had a PCN reaction occurring within the last 10 years:Yes If all of the above answers are "NO", then may proceed with Cephalosporin use.    Amoxicillin Rash   Clindamycin/Lincomycin Rash    History of Present Illness    [age]ear old female with the above past medical history including normal coronary arteries on cardiac catheterization in 08/2017, paroxysmal atrial fibrillation, chronic diastolic heart failure, hypertension, hyperlipidemia, hypothyroidism, and CKD stage III.  She was initially evaluated by Dr. HiDebara Pickettn 2018 for new onset atrial fibrillation with RVR after induction with anesthesia during surgery for ankle fracture.  Echocardiogram at the time showed normal LV function.  She was started on low-dose beta-blocker and Xarelto (CHA2DS2VASc = 3).  Beta-blocker was ultimately discontinued in the setting of hypotension.   She had recurrent atrial fibrillation with RVR in July 2019.  Echocardiogram at the time showed EF 50 to 55%, normal wall motion, BAE, mild MR, and mild to moderate TR.  She was started on amiodarone and outpatient DCCV was arranged.   However, when she presented for the procedure, she was in normal sinus rhythm.  She was hospitalized in August 2019 with acute on chronic hypoxic respiratory failure second to pneumonia with sepsis, acute diastolic heart failure.  She was noted to be back in atrial fibrillation with RVR at the time.  Troponin was elevated.  She underwent cardiac catheterization which showed normal coronary arteries.  She was diuresed and treated with antibiotics.  She has not had any recurrence of atrial fibrillation since that time.  Amiodarone was discontinued in May 2021. She was last seen in the office on 09/24/2020 and was stable from a cardiac standpoint.  He did note significant bilateral lower extremity edema in the setting of IV hydration from recent hospitalization, just weeks before her visit the setting of pancreatitis.  6 was temporarily increased.  She was maintaining sinus rhythm at the time.  She presents today for follow-up.  Since her last visit  Paroxysmal atrial fibrillation: Chronic diastolic heart failure: Hypertension: Hyperlipidemia: CKD stage III: Disposition:    Home Medications    Current Outpatient Medications  Medication Sig Dispense Refill   acetaminophen (TYLENOL) 650 MG CR tablet Take 650 mg by mouth  in the morning and at bedtime. (Patient not taking: Reported on 05/06/2021)     albuterol (VENTOLIN HFA) 108 (90 Base) MCG/ACT inhaler inhale 2 PUFFS by MOUTH into THE lungs EVERY 4 HOURS AS NEEDED FOR FOR WHEEZING OR SHORTNESS OF BREATH 8.5 g 1   ALPRAZolam (XANAX) 0.5 MG tablet 1 tab po tid prn anxiety 90 tablet 5   amLODipine (NORVASC) 10 MG tablet Take 1 tablet (10 mg total) by mouth daily. 90 tablet 1   Apoaequorin (PREVAGEN PO) Take 1 tablet by mouth daily.     Ascorbic Acid (VITAMIN C PO) Take by mouth daily.     atorvastatin (LIPITOR) 40 MG tablet Take 1 tablet (40 mg total) by mouth daily. 90 tablet 3   Biotin 5000 MCG TABS Take 5,000 mcg by mouth daily.     Calcium  Carbonate (CALCIUM 600 PO) Take 1,200 mg by mouth daily.      carboxymethylcellulose (REFRESH PLUS) 0.5 % SOLN Place 2 drops into both eyes daily as needed (dry/irritated eyes.). (Patient not taking: Reported on 05/06/2021)     Coenzyme Q10 200 MG capsule Take 200 mg by mouth daily.     fluticasone (FLONASE) 50 MCG/ACT nasal spray Place 2 sprays into both nostrils daily.      furosemide (LASIX) 40 MG tablet Take 1 tablet (40 mg total) by mouth daily. 90 tablet 3   hydrALAZINE (APRESOLINE) 50 MG tablet Take 1 tablet (50 mg total) by mouth 3 (three) times daily. 270 tablet 3   metoprolol tartrate (LOPRESSOR) 100 MG tablet TAKE 1 TABLET BY MOUTH 2 TIMES DAILY 180 tablet 2   Multiple Vitamins-Minerals (CENTRUM SILVER ULTRA WOMENS) TABS Take 1 tablet by mouth every evening.     potassium chloride SA (KLOR-CON) 20 MEQ tablet TAKE 1 TABLET BY MOUTH 2 TIMES DAILY 180 tablet 3   Rivaroxaban (XARELTO) 15 MG TABS tablet TAKE 1 TABLET BY MOUTH DAILY AT NOON 90 tablet 0   No current facility-administered medications for this visit.     Review of Systems    ***.  All other systems reviewed and are otherwise negative except as noted above.    Physical Exam    VS:  LMP  (LMP Unknown)  , BMI There is no height or weight on file to calculate BMI.     GEN: Well nourished, well developed, in no acute distress. HEENT: normal. Neck: Supple, no JVD, carotid bruits, or masses. Cardiac: RRR, no murmurs, rubs, or gallops. No clubbing, cyanosis, edema.  Radials/DP/PT 2+ and equal bilaterally.  Respiratory:  Respirations regular and unlabored, clear to auscultation bilaterally. GI: Soft, nontender, nondistended, BS + x 4. MS: no deformity or atrophy. Skin: warm and dry, no rash. Neuro:  Strength and sensation are intact. Psych: Normal affect.  Accessory Clinical Findings    ECG personally reviewed by me today - *** - no acute changes.  Lab Results  Component Value Date   WBC 8.2 09/24/2020   HGB 11.7  09/24/2020   HCT 36.4 09/24/2020   MCV 90 09/24/2020   PLT 460 (H) 09/24/2020   Lab Results  Component Value Date   CREATININE 1.02 05/06/2021   BUN 23 05/06/2021   NA 142 05/06/2021   K 4.4 05/06/2021   CL 105 05/06/2021   CO2 28 05/06/2021   Lab Results  Component Value Date   ALT 24 05/06/2021   AST 21 05/06/2021   ALKPHOS 78 05/06/2021   BILITOT 0.9 05/06/2021   Lab Results  Component Value Date   CHOL 159 05/06/2021   HDL 72.20 05/06/2021   LDLCALC 72 05/06/2021   LDLDIRECT 66.0 09/10/2018   TRIG 75.0 05/06/2021   CHOLHDL 2 05/06/2021    No results found for: "HGBA1C"  Assessment & Plan    1.  ***   Lenna Sciara, NP 07/02/2021, 6:36 AM

## 2021-07-27 ENCOUNTER — Other Ambulatory Visit: Payer: Self-pay | Admitting: Family Medicine

## 2021-07-27 ENCOUNTER — Ambulatory Visit: Payer: Medicare PPO | Admitting: Internal Medicine

## 2021-07-28 ENCOUNTER — Ambulatory Visit: Payer: Medicare PPO | Admitting: Nurse Practitioner

## 2021-07-29 ENCOUNTER — Other Ambulatory Visit: Payer: Self-pay | Admitting: Internal Medicine

## 2021-07-29 DIAGNOSIS — I480 Paroxysmal atrial fibrillation: Secondary | ICD-10-CM

## 2021-07-29 NOTE — Telephone Encounter (Addendum)
Prescription refill request for Xarelto received.  Indication: afib  Last office visit: Brenda Matthews 09/24/2020 Weight:77.9 kg Age: 86 yo Scr: 1.02, 05/06/2021 CrCl: 52 ml/min  Pt qualifies for a dose change per dosing criteria. Msg sent to pharm D.

## 2021-07-29 NOTE — Telephone Encounter (Signed)
Discussed with Megan pharm D. Will keep pt on current dose of Xarelto for now.

## 2021-08-03 ENCOUNTER — Other Ambulatory Visit: Payer: Self-pay | Admitting: Internal Medicine

## 2021-08-03 DIAGNOSIS — I480 Paroxysmal atrial fibrillation: Secondary | ICD-10-CM

## 2021-08-03 NOTE — Telephone Encounter (Signed)
Xarelto 69m refill request received. Pt is 86years old, weight-77.9kg, Crea-1.02 on 05/06/21, last seen by CSande Riveson 09/24/2020, Diagnosis-Afib, CrCl-50.456mmin; Dose is appropriate based on dosing criteria. Will send in refill to requested pharmacy.   Discussed with PharmD-Chris to continue current dose at this time sin CrCl 15-5024min.

## 2021-08-20 DIAGNOSIS — M25562 Pain in left knee: Secondary | ICD-10-CM | POA: Diagnosis not present

## 2021-08-20 DIAGNOSIS — M25561 Pain in right knee: Secondary | ICD-10-CM | POA: Diagnosis not present

## 2021-08-25 ENCOUNTER — Other Ambulatory Visit: Payer: Self-pay | Admitting: Family Medicine

## 2021-08-26 ENCOUNTER — Other Ambulatory Visit: Payer: Self-pay

## 2021-08-26 ENCOUNTER — Ambulatory Visit: Payer: Medicare PPO | Admitting: Nurse Practitioner

## 2021-08-26 NOTE — Progress Notes (Deleted)
Office Visit    Patient Name: Brenda Matthews Date of Encounter: 08/26/2021  Primary Care Provider:  [name], MD Primary Cardiologist:  [name], MD  Chief Complaint    ***  Past Medical History    Past Medical History:  Diagnosis Date   Abnormal EKG approx 2008   Nuclear stress test neg;    Acute pancreatitis 09/2020   idiopathic.  +panc psudocyst   Anxiety    with panic   CAP (community acquired pneumonia) 08/2017   Hospitalization for CAP/acute diast HF/rapid a-fib   Cataract    s/p surgery--lens implants   Chronic diastolic heart failure ([place]) 2019   Chronic renal insufficiency, stage 3 (moderate) ([place]) 12/04/2012   Renal u/s when in hosp 08/2017 for CAP/CHF showed symmetric kidneys, echogenicity normal, w/out hydronephrosis. Baseline GFR around 40 ml/min as of 10/2018.   DDD (degenerative disc disease), lumbar    Diverticulosis 2009   Fracture of radial shaft, left, closed 11/16/2010   fell down flight of stairs   History of kidney stones    Hx of adenomatous colonic polyps 2002;2009;2015   surveillance colonoscopy 2009, +polypectomy done-tubular adenoma w/out high grade.  05/2013 tubular adenomas--recall 3 yrs   Hyperlipidemia    Hypertension    Low TSH level 02/18/2016   T3 norm, T4 mildly elevated--suspected sick euthyroid syndrome.  Repeat labs 06/2016: normal.   Melanoma in situ ([place]) 06/2018   L LL   Osteoarthritis of both knees    viscosupplementation injections helpful 2020/21   Osteopenia    DEXA 08/2010; repeat DEXA 02/2015 worse: fosamax started.  06/2018 Dexa T score -2.4.  2020 maj osteop fx risk = 24%, Hip fx risk 7.3%. 09/2020 T score -2.1   PAF (paroxysmal atrial fibrillation) ([place]) 02/2016   when in post-op for ankle surgery; spontaneously converted in hosp, seen by Dr. Debara Pickett in consultation--metoprolol rate control + xarelto recommended.  Metop d/c due to hypot.  Plan to cont xarelto 20 mg qd indef due to CHAD-VASc score of 3.  A-fib  w/RVR and CHF 07/2017; pt placed on amiodarone and plan for CV, but pt was in sinus rhythm when she went in for her DC CV, so she was sent home.   Peripheral edema    Pneumonia 2015   hx with sepsis   Rheumatic fever    Past Surgical History:  Procedure Laterality Date   APPENDECTOMY  1966   done during surgery for tubal pregnancy   CATARACT EXTRACTION W/ INTRAOCULAR LENS IMPLANT  2013   bilat   COLONOSCOPY W/ POLYPECTOMY  05/2013   +diverticulosis; recall 3 yrs (Dr. Henrene Pastor)   DEXA  02/2015; 06/2018   T score -2.1 in both femoral necks; FRAX 10 yr risk of major osteoporotic fracture was 21%---fosamax started. 06/2018 T score -2.4.  T score 09/2020 -2.1. Rpt 2 yrs.   ECTOPIC PREGNANCY SURGERY     EYE SURGERY     LEFT HEART CATH AND CORONARY ANGIOGRAPHY N/A 09/01/2017   No angiographically significant CAD.  Upper normal left ventricular filling pressure.  Procedure: LEFT HEART CATH AND CORONARY ANGIOGRAPHY;  Surgeon: Nelva Bush, MD;  Location: [facility];  Service: Cardiovascular;  Laterality: N/A;   LUMBAR LAMINECTOMY/DECOMPRESSION MICRODISCECTOMY N/A 09/26/2018   Procedure: Decompressive Lumbar Laminectomy L5 S1 FORAMINOTOMY L5 S1  NERVE ROOT BILATERALLY and Microdiscectomy L5-S1 Left;  Surgeon: Latanya Maudlin, MD;  Location: [facility];  Service: Orthopedics;  Laterality: N/A;  140mn   OPEN REDUCTION  INTERNAL FIXATION (ORIF) TIBIA/FIBULA FRACTURE Left 02/18/2016   Procedure: OPEN REDUCTION INTERNAL FIXATION (ORIF) Right ankle trimalleolar fracture;  Surgeon: Wylene Simmer, MD;  Location: ;  Service: Orthopedics;  Laterality: Left;  requests 63mns   ORIF RADIAL FRACTURE  11/18/2010   left; s/p slip on slippery floor and fell   THORACENTESIS  08/2017   diagnostic and therapeutic.  Transudative.  Clx neg.  (+pulm edema/diastolic HF)   TONSILLECTOMY     TRANSTHORACIC ECHOCARDIOGRAM  02/18/2016; 08/10/17   LVEF of 55-60%, mild AI and mild MR and normal biatrial size.   07/2017--normal LV function, mild enlarge aortic root, mild/mod TR, bilat atrial enlargement.    Allergies  Allergies  Allergen Reactions   Augmentin [Amoxicillin-Pot Clavulanate] Nausea And Vomiting and Other (See Comments)    "projectile vomiting" Has patient had a PCN reaction causing immediate rash, facial/tongue/throat swelling, SOB or lightheadedness with hypotension:No Has patient had a PCN reaction causing severe rash involving mucus membranes or skin necrosis:No Has patient had a PCN reaction that required hospitalization:No Has patient had a PCN reaction occurring within the last 10 years:Yes If all of the above answers are "NO", then may proceed with Cephalosporin use.    Amoxicillin Rash   Clindamycin/Lincomycin Rash    History of Present Illness    ***  Home Medications    Current Outpatient Medications  Medication Sig Dispense Refill   acetaminophen (TYLENOL) 650 MG CR tablet Take 650 mg by mouth in the morning and at bedtime. (Patient not taking: Reported on 05/06/2021)     albuterol (VENTOLIN HFA) 108 (90 Base) MCG/ACT inhaler INHALE 2 PUFFS BY MOUTH into THE lungs EVERY 4 HOURS AS NEEDED FOR WHEEZING OR SHORTNESS OF BREATH 8.5 g 1   ALPRAZolam (XANAX) 0.5 MG tablet 1 tab po tid prn anxiety 90 tablet 5   amLODipine (NORVASC) 10 MG tablet TAKE 1 TABLET BY MOUTH EVERY DAY 90 tablet 1   Apoaequorin (PREVAGEN PO) Take 1 tablet by mouth daily.     Ascorbic Acid (VITAMIN C PO) Take by mouth daily.     atorvastatin (LIPITOR) 40 MG tablet TAKE 1 TABLET BY MOUTH EVERY DAY 90 tablet 1   Biotin 5000 MCG TABS Take 5,000 mcg by mouth daily.     Calcium Carbonate (CALCIUM 600 PO) Take 1,200 mg by mouth daily.      carboxymethylcellulose (REFRESH PLUS) 0.5 % SOLN Place 2 drops into both eyes daily as needed (dry/irritated eyes.). (Patient not taking: Reported on 05/06/2021)     Coenzyme Q10 200 MG capsule Take 200 mg by mouth daily.     fluticasone (FLONASE) 50 MCG/ACT nasal spray  Place 2 sprays into both nostrils daily.      furosemide (LASIX) 40 MG tablet Take 1 tablet (40 mg total) by mouth daily. 90 tablet 3   hydrALAZINE (APRESOLINE) 50 MG tablet TAKE 1 TABLET BY MOUTH 3 TIMES DAILY 270 tablet 1   metoprolol tartrate (LOPRESSOR) 100 MG tablet TAKE 1 TABLET BY MOUTH 2 TIMES DAILY 180 tablet 2   Multiple Vitamins-Minerals (CENTRUM SILVER ULTRA WOMENS) TABS Take 1 tablet by mouth every evening.     potassium chloride SA (KLOR-CON) 20 MEQ tablet TAKE 1 TABLET BY MOUTH 2 TIMES DAILY 180 tablet 3   Rivaroxaban (XARELTO) 15 MG TABS tablet TAKE 1 TABLET BY MOUTH EVERY DAY AT NOON 90 tablet 1   No current facility-administered medications for this visit.     Review of Systems    ***.  All other systems reviewed and are otherwise negative except as noted above.    Physical Exam    VS:  LMP  (LMP Unknown)  , BMI There is no height or weight on file to calculate BMI.     GEN: Well nourished, well developed, in no acute distress. HEENT: normal. Neck: Supple, no JVD, carotid bruits, or masses. Cardiac: RRR, no murmurs, rubs, or gallops. No clubbing, cyanosis, edema.  Radials/DP/PT 2+ and equal bilaterally.  Respiratory:  Respirations regular and unlabored, clear to auscultation bilaterally. GI: Soft, nontender, nondistended, BS + x 4. MS: no deformity or atrophy. Skin: warm and dry, no rash. Neuro:  Strength and sensation are intact. Psych: Normal affect.  Accessory Clinical Findings    ECG personally reviewed by me today - *** - no acute changes.  Lab Results  Component Value Date   WBC 8.2 09/24/2020   HGB 11.7 09/24/2020   HCT 36.4 09/24/2020   MCV 90 09/24/2020   PLT 460 (H) 09/24/2020   Lab Results  Component Value Date   CREATININE 1.02 05/06/2021   BUN 23 05/06/2021   NA 142 05/06/2021   K 4.4 05/06/2021   CL 105 05/06/2021   CO2 28 05/06/2021   Lab Results  Component Value Date   ALT 24 05/06/2021   AST 21 05/06/2021   ALKPHOS 78  05/06/2021   BILITOT 0.9 05/06/2021   Lab Results  Component Value Date   CHOL 159 05/06/2021   HDL 72.20 05/06/2021   LDLCALC 72 05/06/2021   LDLDIRECT 66.0 09/10/2018   TRIG 75.0 05/06/2021   CHOLHDL 2 05/06/2021    No results found for: "HGBA1C"  Assessment & Plan    1.  ***   Lenna Sciara, NP 08/26/2021, 10:15 AM

## 2021-08-30 NOTE — Progress Notes (Deleted)
Cardiology Clinic Note   Patient Name: Brenda Matthews Date of Encounter: 08/30/2021  Primary Care Provider:  [name], MD Primary Cardiologist:  [name], MD  Patient Brenda Matthews 86 year old female presents to the clinic today for follow-up evaluation of her essential hypertension and paroxysmal atrial fibrillation.  Past Medical History    Past Medical History:  Diagnosis Date   Abnormal EKG approx 2008   Nuclear stress test neg;    Acute pancreatitis 09/2020   idiopathic.  +panc psudocyst   Anxiety    with panic   CAP (community acquired pneumonia) 08/2017   Hospitalization for CAP/acute diast HF/rapid a-fib   Cataract    s/p surgery--lens implants   Chronic diastolic heart failure ([place]) 2019   Chronic renal insufficiency, stage 3 (moderate) ([place]) 12/04/2012   Renal u/s when in hosp 08/2017 for CAP/CHF showed symmetric kidneys, echogenicity normal, w/out hydronephrosis. Baseline GFR around 40 ml/min as of 10/2018.   DDD (degenerative disc disease), lumbar    Diverticulosis 2009   Fracture of radial shaft, left, closed 11/16/2010   fell down flight of stairs   History of kidney stones    Hx of adenomatous colonic polyps 2002;2009;2015   surveillance colonoscopy 2009, +polypectomy done-tubular adenoma w/out high grade.  05/2013 tubular adenomas--recall 3 yrs   Hyperlipidemia    Hypertension    Low TSH level 02/18/2016   T3 norm, T4 mildly elevated--suspected sick euthyroid syndrome.  Repeat labs 06/2016: normal.   Melanoma in situ ([place]) 06/2018   L LL   Osteoarthritis of both knees    viscosupplementation injections helpful 2020/21   Osteopenia    DEXA 08/2010; repeat DEXA 02/2015 worse: fosamax started.  06/2018 Dexa T score -2.4.  2020 maj osteop fx risk = 24%, Hip fx risk 7.3%. 09/2020 T score -2.1   PAF (paroxysmal atrial fibrillation) ([place]) 02/2016   when in post-op for ankle surgery; spontaneously converted in hosp, seen by Dr. Debara Pickett in  consultation--metoprolol rate control + xarelto recommended.  Metop d/c due to hypot.  Plan to cont xarelto 20 mg qd indef due to CHAD-VASc score of 3.  A-fib w/RVR and CHF 07/2017; pt placed on amiodarone and plan for CV, but pt was in sinus rhythm when she went in for her DC CV, so she was sent home.   Peripheral edema    Pneumonia 2015   hx with sepsis   Rheumatic fever    Past Surgical History:  Procedure Laterality Date   APPENDECTOMY  1966   done during surgery for tubal pregnancy   CATARACT EXTRACTION W/ INTRAOCULAR LENS IMPLANT  2013   bilat   COLONOSCOPY W/ POLYPECTOMY  05/2013   +diverticulosis; recall 3 yrs (Dr. Henrene Pastor)   DEXA  02/2015; 06/2018   T score -2.1 in both femoral necks; FRAX 10 yr risk of major osteoporotic fracture was 21%---fosamax started. 06/2018 T score -2.4.  T score 09/2020 -2.1. Rpt 2 yrs.   ECTOPIC PREGNANCY SURGERY     EYE SURGERY     LEFT HEART CATH AND CORONARY ANGIOGRAPHY N/A 09/01/2017   No angiographically significant CAD.  Upper normal left ventricular filling pressure.  Procedure: LEFT HEART CATH AND CORONARY ANGIOGRAPHY;  Surgeon: Nelva Bush, MD;  Location: [facility];  Service: Cardiovascular;  Laterality: N/A;   LUMBAR LAMINECTOMY/DECOMPRESSION MICRODISCECTOMY N/A 09/26/2018   Procedure: Decompressive Lumbar Laminectomy L5 S1 FORAMINOTOMY L5 S1  NERVE ROOT BILATERALLY and Microdiscectomy L5-S1 Left;  Surgeon: Latanya Maudlin, MD;  Location: [facility];  Service: Orthopedics;  Laterality: N/A;  153mn   OPEN REDUCTION INTERNAL FIXATION (ORIF) TIBIA/FIBULA FRACTURE Left 02/18/2016   Procedure: OPEN REDUCTION INTERNAL FIXATION (ORIF) Right ankle trimalleolar fracture;  Surgeon: JWylene Simmer MD;  [facility]  Service: Orthopedics;  Laterality: Left;  requests 943ms   ORIF RADIAL FRACTURE  11/18/2010   left; s/p slip on slippery floor and fell   THORACENTESIS  08/2017   diagnostic and therapeutic.  Transudative.  Clx neg.  (+pulm  edema/diastolic HF)   TONSILLECTOMY     TRANSTHORACIC ECHOCARDIOGRAM  02/18/2016; 08/10/17   LVEF of 55-60%, mild AI and mild MR and normal biatrial size.  07/2017--normal LV function, mild enlarge aortic root, mild/mod TR, bilat atrial enlargement.    Allergies  Allergies  Allergen Reactions   Augmentin [Amoxicillin-Pot Clavulanate] Nausea And Vomiting and Other (See Comments)    "projectile vomiting" Has patient had a PCN reaction causing immediate rash, facial/tongue/throat swelling, SOB or lightheadedness with hypotension:No Has patient had a PCN reaction causing severe rash involving mucus membranes or skin necrosis:No Has patient had a PCN reaction that required hospitalization:No Has patient had a PCN reaction occurring within the last 10 years:Yes If all of the above answers are "NO", then may proceed with Cephalosporin use.    Amoxicillin Rash   Clindamycin/Lincomycin Rash    History of Present Illness    Brenda ZENTZas a PMH of HTN, PAF, chronic diastolic CHF, hyperlipidemia, anemia, anxiety, insomnia, DOE, status post thoracentesis, and edema.  She underwent cardiac catheterization 8/19 which showed normal coronaries.  She was seen by Dr. HiDebara Pickett/18 for new onset atrial fibrillation with RVR after induction with anesthesia for ankle fracture.  Her echocardiogram showed normal LV function.  She was initially placed on low-dose beta-blocker and Xarelto.  She was taken off beta-blocker due to low blood pressure and converted back to sinus rhythm.  CHA2DS2-VASc score 3.  She was advised to continue Xarelto.  She had a recurrent episode of atrial fibrillation with RVR 7/19.  Her echocardiogram at that time showed an LV function of 50-55%, mild MR, mild-moderate TR.  She was started on amiodarone and outpatient DCCV was scheduled.  On presentation for DCCV she was found to be in sinus rhythm.  She was seen in follow-up by Dr. HiDebara Pickettn 5/21.  During that time she continued to do  well from cardiac standpoint.  Her amiodarone was discontinued.  She was seen in follow-up by CaSande RivesPA-C on 09/24/2020.  At that time she reported that she had been admitted 09/14/2020 until 09/19/2020 with an acute episode of pancreatitis and pancreatic pseudocyst.  GI was consulted and felt conservative management was her best option.  She was also treated for acute UTI.  She had no cardiac issues during her admission.  [facility] On follow-up she continues to do well from a cardiac standpoint.  She reported that during her hospitalization she received IV fluids and had significant lower extremity swelling.  Her left leg was noted to be chronically larger than her right.  She was elevating her legs and using compression stockings.  She noted occasional shortness of breath that would improve with inhaler use.  She denied orthopnea and PND.  She occasionally noted palpitations which would last for 2-3 minutes and resolved.  She denied lightheadedness, dizziness and syncope.  She presents to the clinic today for follow-up evaluation states***  *** denies  chest pain, shortness of breath, lower extremity edema, fatigue, palpitations, melena, hematuria, hemoptysis, diaphoresis, weakness, presyncope, syncope, orthopnea, and PND.   Paroxysmal atrial fibrillation-heart rate today***.  Denies recent episodes of increased or accelerated heartbeat.  Beta-blocker previously stopped due to low blood pressure chest Vascor 3.  Denies bleeding issues. Continue Xarelto, metoprolol Heart healthy low-sodium diet-salty 6 given Increase physical activity as tolerated  Chronic diastolic CHF-euvolemic today.  No increased DOE or activity intolerance.  Left leg chronically larger than right. Continue hydralazine, metoprolol, furosemide, potassium Heart healthy low-sodium diet-salty 6 given Increase physical activity as tolerated  Primary hypertension-BP today***.  Well-controlled at  home. Continue metoprolol, and pain Heart healthy low-sodium diet-salty 6 given Increase physical activity as tolerated  Hyperlipidemia-05/06/2021: Cholesterol 159; HDL 72.20; LDL Cholesterol 72; Triglycerides 75.0; VLDL 15.0 Continue atorvastatin Heart healthy low-sodium diet Increase physical activity as tolerated  CKD stage III-creatinine 1.02 Follows with PCP  Disposition: Follow-up with Dr. Debara Pickett or APP in 6 months.  Home Medications    Prior to Admission medications   Medication Sig Start Date End Date Taking? Authorizing Provider  acetaminophen (TYLENOL) 650 MG CR tablet Take 650 mg by mouth in the morning and at bedtime. Patient not taking: Reported on 05/06/2021    [provider]  albuterol (VENTOLIN HFA) 108 (90 Base) MCG/ACT inhaler INHALE 2 PUFFS BY MOUTH into THE lungs EVERY 4 HOURS AS NEEDED FOR WHEEZING OR SHORTNESS OF BREATH 08/25/21   McGowen, Adrian Blackwater, MD  ALPRAZolam Duanne Moron) 0.5 MG tablet 1 tab po tid prn anxiety 04/05/21   McGowen, Adrian Blackwater, MD  amLODipine (NORVASC) 10 MG tablet TAKE 1 TABLET BY MOUTH EVERY DAY 07/27/21   McGowen, Adrian Blackwater, MD  Apoaequorin (PREVAGEN PO) Take 1 tablet by mouth daily.    [provider]  Ascorbic Acid (VITAMIN C PO) Take by mouth daily.    [provider]  atorvastatin (LIPITOR) 40 MG tablet TAKE 1 TABLET BY MOUTH EVERY DAY 07/27/21   McGowen, Adrian Blackwater, MD  Biotin 5000 MCG TABS Take 5,000 mcg by mouth daily.    [provider]  Calcium Carbonate (CALCIUM 600 PO) Take 1,200 mg by mouth daily.     [provider]  carboxymethylcellulose (REFRESH PLUS) 0.5 % SOLN Place 2 drops into both eyes daily as needed (dry/irritated eyes.). Patient not taking: Reported on 05/06/2021    [provider]  Coenzyme Q10 200 MG capsule Take 200 mg by mouth daily.    [provider]  fluticasone (FLONASE) 50 MCG/ACT nasal spray Place 2 sprays into both nostrils daily.     [provider]   furosemide (LASIX) 40 MG tablet Take 1 tablet (40 mg total) by mouth daily. 08/27/20   McGowen, Adrian Blackwater, MD  hydrALAZINE (APRESOLINE) 50 MG tablet TAKE 1 TABLET BY MOUTH 3 TIMES DAILY 07/27/21   McGowen, Adrian Blackwater, MD  metoprolol tartrate (LOPRESSOR) 100 MG tablet TAKE 1 TABLET BY MOUTH 2 TIMES DAILY 04/28/21   Hilty, Nadean Corwin, MD  Multiple Vitamins-Minerals (CENTRUM SILVER ULTRA WOMENS) TABS Take 1 tablet by mouth every evening.    [provider]  potassium chloride SA (KLOR-CON) 20 MEQ tablet TAKE 1 TABLET BY MOUTH 2 TIMES DAILY 10/14/20   Hilty, Nadean Corwin, MD  Rivaroxaban (XARELTO) 15 MG TABS tablet TAKE 1 TABLET BY MOUTH EVERY DAY AT [facility] 08/03/21   Hilty, Nadean Corwin, MD    Family History    Family History  Problem Relation Age of Onset  Heart disease Mother    Heart disease Father    Hypertension Brother    Diabetes Sister    Colon cancer Neg Hx    Pancreatic cancer Neg Hx    Rectal cancer Neg Hx    Stomach cancer Neg Hx    She indicated that her mother is deceased. She indicated that her father is deceased. She indicated that her sister is deceased. She indicated that the status of her brother is unknown. She indicated that the status of her neg hx is unknown.  Social History    Social History   Socioeconomic History   Marital status: Widowed    Spouse name: Not on file   Number of children: Not on file   Years of education: Not on file   Highest education level: Not on file  Occupational History   Not on file  Tobacco Use   Smoking status: Never   Smokeless tobacco: Never  Vaping Use   Vaping Use: Never used  Substance and Sexual Activity   Alcohol use: Yes    Comment: rarely   Drug use: Never   Sexual activity: Not on file  Other Topics Concern   Not on file  Social History Narrative   Widow, 2 sons.   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   No tobacco.  Rare alcohol.   No drugs.  Exercise: 4 times per week, about 39m.   Social Determinants of Health    Financial Resource Strain: Low Risk  (10/11/2020)   Overall Financial Resource Strain (CARDIA)    Difficulty of Paying Living Expenses: Not hard at all  Food Insecurity: No Food Insecurity (10/11/2020)   Hunger Vital Sign    Worried About Running Out of Food in the Last Year: Never true    Ran Out of Food in the Last Year: Never true  Transportation Needs: No Transportation Needs (10/11/2020)   PRAPARE - THydrologist(Medical): No    Lack of Transportation (Non-Medical): No  Physical Activity: Insufficiently Active (10/11/2020)   Exercise Vital Sign    Days of Exercise per Week: 3 days    Minutes of Exercise per Session: 30 min  Stress: Not on file  Social Connections: Moderately Integrated (10/11/2020)   Social Connection and Isolation Panel [NHANES]    Frequency of Communication with Friends and Family: More than three times a week    Frequency of Social Gatherings with Friends and Family: More than three times a week    Attends Religious Services: More than 4 times per year    Active Member of [employer] Organizations: Yes    Attends CArchivistMeetings: More than 4 times per year    Marital Status: Widowed  Intimate Partner Violence: Not At Risk (10/11/2020)   Humiliation, Afraid, Rape, and Kick questionnaire    Fear of Current or Ex-Partner: No    Emotionally Abused: No    Physically Abused: No    Sexually Abused: No     Review of Systems    General:  No chills, fever, night sweats or weight changes.  Cardiovascular:  No chest pain, dyspnea on exertion, edema, orthopnea, palpitations, paroxysmal nocturnal dyspnea. Dermatological: No rash, lesions/masses Respiratory: No cough, dyspnea Urologic: No hematuria, dysuria Abdominal:   No nausea, vomiting, diarrhea, bright red blood per rectum, melena, or hematemesis Neurologic:  No visual changes, wkns, changes in mental status. All other systems reviewed and are otherwise negative except as  noted above.  Physical Exam  VS:  LMP  (LMP Unknown)  , BMI There is no height or weight on file to calculate BMI. GEN: Well nourished, well developed, in no acute distress. HEENT: normal. Neck: Supple, no JVD, carotid bruits, or masses. Cardiac: RRR, no murmurs, rubs, or gallops. No clubbing, cyanosis, edema.  Radials/DP/PT 2+ and equal bilaterally.  Respiratory:  Respirations regular and unlabored, clear to auscultation bilaterally. GI: Soft, nontender, nondistended, BS + x 4. MS: no deformity or atrophy. Skin: warm and dry, no rash. Neuro:  Strength and sensation are intact. Psych: Normal affect.  Accessory Clinical Findings    Recent Labs: 09/18/2020: Magnesium 2.0 09/24/2020: Hemoglobin 11.7; NT-Pro BNP 2,759; Platelets 460 05/06/2021: ALT 24; BUN 23; Creatinine, Ser 1.02; Potassium 4.4; Sodium 142   Recent Lipid Panel    Component Value Date/Time   CHOL 159 05/06/2021 0936   TRIG 75.0 05/06/2021 0936   HDL 72.20 05/06/2021 0936   CHOLHDL 2 05/06/2021 0936   VLDL 15.0 05/06/2021 0936   LDLCALC 72 05/06/2021 0936   LDLDIRECT 66.0 09/10/2018 0913    ECG personally reviewed by me today- *** - No acute changes  Echocardiogram 08/10/2017  Study Conclusions   - Left ventricle: The cavity size was normal. Wall thickness was    increased in a pattern of mild LVH. Systolic function was normal.    The estimated ejection fraction was in the range of 50% to 55%.    Wall motion was normal; there were no regional wall motion    abnormalities.  - Aortic valve: There was trivial regurgitation.  - Aortic root: The aortic root was mildly dilated.  - Mitral valve: There was mild regurgitation.  - Left atrium: The atrium was severely dilated.  - Right atrium: The atrium was mildly dilated.  - Tricuspid valve: There was mild-moderate regurgitation.  - Pulmonary arteries: PA peak pressure: 32 mm Hg (S).  - Pericardium, extracardiac: A trivial pericardial effusion was    identified.    Impressions:   - Normal LV systolic function; mild LVH; trace AI; mildly dilated    aortic root; mild MR; biatrial enlargement; mild to moderate TR.  Assessment & Plan   1.  ***   Jossie Ng. Shanielle Correll NP-C     08/30/2021, 9:06 AM [address] [place] Office [phone] Fax [phone]  Notice: This dictation was prepared with Dragon dictation along with smaller phrase technology. Any transcriptional errors that result from this process are unintentional and may not be corrected upon review.  I spent***minutes examining this patient, reviewing medications, and using patient centered shared decision making involving her cardiac care.  Prior to her visit I spent greater than 20 minutes reviewing her past medical history,  medications, and prior cardiac tests.

## 2021-08-31 ENCOUNTER — Ambulatory Visit: Payer: Medicare PPO | Admitting: General Practice

## 2021-09-03 NOTE — Progress Notes (Unsigned)
Cardiology Clinic Note   Patient Name: Brenda Matthews Date of Encounter: 09/06/2021  Primary Care Provider:  [name], MD Primary Cardiologist:  [name], MD  Patient Brenda Matthews 86 year old female presents to the clinic today for follow-up evaluation of her essential hypertension and paroxysmal atrial fibrillation.  Past Medical History    Past Medical History:  Diagnosis Date   Abnormal EKG approx 2008   Nuclear stress test neg;    Acute pancreatitis 09/2020   idiopathic.  +panc psudocyst   Anxiety    with panic   CAP (community acquired pneumonia) 08/2017   Hospitalization for CAP/acute diast HF/rapid a-fib   Cataract    s/p surgery--lens implants   Chronic diastolic heart failure ([place]) 2019   Chronic renal insufficiency, stage 3 (moderate) ([place]) 12/04/2012   Renal u/s when in hosp 08/2017 for CAP/CHF showed symmetric kidneys, echogenicity normal, w/out hydronephrosis. Baseline GFR around 40 ml/min as of 10/2018.   DDD (degenerative disc disease), lumbar    Diverticulosis 2009   Fracture of radial shaft, left, closed 11/16/2010   fell down flight of stairs   History of kidney stones    Hx of adenomatous colonic polyps 2002;2009;2015   surveillance colonoscopy 2009, +polypectomy done-tubular adenoma w/out high grade.  05/2013 tubular adenomas--recall 3 yrs   Hyperlipidemia    Hypertension    Low TSH level 02/18/2016   T3 norm, T4 mildly elevated--suspected sick euthyroid syndrome.  Repeat labs 06/2016: normal.   Melanoma in situ ([place]) 06/2018   L LL   Osteoarthritis of both knees    viscosupplementation injections helpful 2020/21   Osteopenia    DEXA 08/2010; repeat DEXA 02/2015 worse: fosamax started.  06/2018 Dexa T score -2.4.  2020 maj osteop fx risk = 24%, Hip fx risk 7.3%. 09/2020 T score -2.1   PAF (paroxysmal atrial fibrillation) ([place]) 02/2016   when in post-op for ankle surgery; spontaneously converted in hosp, seen by Dr. Debara Pickett in  consultation--metoprolol rate control + xarelto recommended.  Metop d/c due to hypot.  Plan to cont xarelto 20 mg qd indef due to CHAD-VASc score of 3.  A-fib w/RVR and CHF 07/2017; pt placed on amiodarone and plan for CV, but pt was in sinus rhythm when she went in for her DC CV, so she was sent home.   Peripheral edema    Pneumonia 2015   hx with sepsis   Rheumatic fever    Past Surgical History:  Procedure Laterality Date   APPENDECTOMY  1966   done during surgery for tubal pregnancy   CATARACT EXTRACTION W/ INTRAOCULAR LENS IMPLANT  2013   bilat   COLONOSCOPY W/ POLYPECTOMY  05/2013   +diverticulosis; recall 3 yrs (Dr. Henrene Pastor)   DEXA  02/2015; 06/2018   T score -2.1 in both femoral necks; FRAX 10 yr risk of major osteoporotic fracture was 21%---fosamax started. 06/2018 T score -2.4.  T score 09/2020 -2.1. Rpt 2 yrs.   ECTOPIC PREGNANCY SURGERY     EYE SURGERY     LEFT HEART CATH AND CORONARY ANGIOGRAPHY N/A 09/01/2017   No angiographically significant CAD.  Upper normal left ventricular filling pressure.  Procedure: LEFT HEART CATH AND CORONARY ANGIOGRAPHY;  Surgeon: Nelva Bush, MD;  Location: [facility];  Service: Cardiovascular;  Laterality: N/A;   LUMBAR LAMINECTOMY/DECOMPRESSION MICRODISCECTOMY N/A 09/26/2018   Procedure: Decompressive Lumbar Laminectomy L5 S1 FORAMINOTOMY L5 S1  NERVE ROOT BILATERALLY and Microdiscectomy L5-S1 Left;  Surgeon: Latanya Maudlin, MD;  Location: [facility];  Service: Orthopedics;  Laterality: N/A;  186mn   OPEN REDUCTION INTERNAL FIXATION (ORIF) TIBIA/FIBULA FRACTURE Left 02/18/2016   Procedure: OPEN REDUCTION INTERNAL FIXATION (ORIF) Right ankle trimalleolar fracture;  Surgeon: JWylene Simmer MD;  [facility]  Service: Orthopedics;  Laterality: Left;  requests [id number]   ORIF RADIAL FRACTURE  11/18/2010   left; s/p slip on slippery floor and fell   THORACENTESIS  08/2017   diagnostic and therapeutic.  Transudative.  Clx neg.  (+pulm  edema/diastolic HF)   TONSILLECTOMY     TRANSTHORACIC ECHOCARDIOGRAM  02/18/2016; 08/10/17   LVEF of 55-60%, mild AI and mild MR and normal biatrial size.  07/2017--normal LV function, mild enlarge aortic root, mild/mod TR, bilat atrial enlargement.    Allergies  Allergies  Allergen Reactions   Augmentin [Amoxicillin-Pot Clavulanate] Nausea And Vomiting and Other (See Comments)    "projectile vomiting" Has patient had a PCN reaction causing immediate rash, facial/tongue/throat swelling, SOB or lightheadedness with hypotension:No Has patient had a PCN reaction causing severe rash involving mucus membranes or skin necrosis:No Has patient had a PCN reaction that required hospitalization:No Has patient had a PCN reaction occurring within the last 10 years:Yes If all of the above answers are "NO", then may proceed with Cephalosporin use.    Amoxicillin Rash   Clindamycin/Lincomycin Rash    History of Present Illness    Brenda Matthews a PMH of HTN, PAF, chronic diastolic CHF, hyperlipidemia, anemia, anxiety, insomnia, DOE, status post thoracentesis, and edema.  She underwent cardiac catheterization 8/19 which showed normal coronaries.  She was seen by Dr. HiDebara Pickett/18 for new onset atrial fibrillation with RVR after induction with anesthesia for ankle fracture.  Her echocardiogram showed normal LV function.  She was initially placed on low-dose beta-blocker and Xarelto.  She was taken off beta-blocker due to low blood pressure and converted back to sinus rhythm.  CHA2DS2-VASc score 3.  She was advised to continue Xarelto.  She had a recurrent episode of atrial fibrillation with RVR 7/19.  Her echocardiogram at that time showed an LV function of 50-55%, mild MR, mild-moderate TR.  She was started on amiodarone and outpatient DCCV was scheduled.  On presentation for DCCV she was found to be in sinus rhythm.  She was seen in follow-up by Dr. HiDebara Pickettn 5/21.  During that time she continued to do  well from cardiac standpoint.  Her amiodarone was discontinued.  She was seen in follow-up by CaSande RivesPA-C on 09/24/2020.  At that time she reported that she had been admitted 09/14/2020 until 09/19/2020 with an acute episode of pancreatitis and pancreatic pseudocyst.  GI was consulted and felt conservative management was her best option.  She was also treated for acute UTI.  She had no cardiac issues during her admission.  [facility] On follow[facility]up she continues to do well from a cardiac standpoint.  She reported that during her hospitalization she received IV fluids and had significant lower extremity swelling.  Her left leg was noted to be chronically larger than her right.  She was elevating her legs and using compression stockings.  She noted occasional shortness of breath that would improve with inhaler use.  She denied orthopnea and PND.  She occasionally noted palpitations which would last for 2-3 minutes and resolved.  She denied lightheadedness, dizziness and syncope.  She presents to the clinic today for follow-up evaluation states she feels well  today.  She occasionally notices episodes of dizziness.  The dissipate without intervention.  She also notices some mild increased work of breathing with walking long distances.  She does not notice increased shortness of breath with normal activities.  She has been going to the gym 3 times per week at [facility].  She has noticed some loose stools and will be following up with GI related to her pancreatitis.  Her EKG today shows normal sinus rhythm 62 bpm.  Her blood pressure is well controlled.  I will have her continue her lower extremity support stockings, low-salt diet, maintain her physical activity and plan follow-up in 6 months.  She has a son that lives in [place].  Today she denies chest pain, shortness of breath, lower extremity edema, fatigue, palpitations, melena, hematuria, hemoptysis, diaphoresis,  weakness, presyncope, syncope, orthopnea, and PND.     Home Medications    Prior to Admission medications   Medication Sig Start Date End Date Taking? Authorizing Provider  acetaminophen (TYLENOL) 650 MG CR tablet Take 650 mg by mouth in the morning and at bedtime. Patient not taking: Reported on 05/06/2021    [provider]  albuterol (VENTOLIN HFA) 108 (90 Base) MCG/ACT inhaler INHALE 2 PUFFS BY MOUTH into THE lungs EVERY 4 HOURS AS NEEDED FOR WHEEZING OR SHORTNESS OF BREATH 08/25/21   McGowen, Adrian Blackwater, MD  ALPRAZolam Duanne Moron) 0.5 MG tablet 1 tab po tid prn anxiety 04/05/21   McGowen, Adrian Blackwater, MD  amLODipine (NORVASC) 10 MG tablet TAKE 1 TABLET BY MOUTH EVERY DAY 07/27/21   McGowen, Adrian Blackwater, MD  Apoaequorin (PREVAGEN PO) Take 1 tablet by mouth daily.    [provider]  Ascorbic Acid (VITAMIN C PO) Take by mouth daily.    [provider]  atorvastatin (LIPITOR) 40 MG tablet TAKE 1 TABLET BY MOUTH EVERY DAY 07/27/21   McGowen, Adrian Blackwater, MD  Biotin 5000 MCG TABS Take 5,000 mcg by mouth daily.    [provider]  Calcium Carbonate (CALCIUM 600 PO) Take 1,200 mg by mouth daily.     [provider]  carboxymethylcellulose (REFRESH PLUS) 0.5 % SOLN Place 2 drops into both eyes daily as needed (dry/irritated eyes.). Patient not taking: Reported on 05/06/2021    [provider]  Coenzyme Q10 200 MG capsule Take 200 mg by mouth daily.    [provider]  fluticasone (FLONASE) 50 MCG/ACT nasal spray Place 2 sprays into both nostrils daily.     [provider]  furosemide (LASIX) 40 MG tablet Take 1 tablet (40 mg total) by mouth daily. 08/27/20   McGowen, Adrian Blackwater, MD  hydrALAZINE (APRESOLINE) 50 MG tablet TAKE 1 TABLET BY MOUTH 3 TIMES DAILY 07/27/21   McGowen, Adrian Blackwater, MD  metoprolol tartrate (LOPRESSOR) 100 MG tablet TAKE 1 TABLET BY MOUTH 2 TIMES DAILY 04/28/21   Hilty, Nadean Corwin, MD  Multiple Vitamins-Minerals (CENTRUM SILVER  ULTRA WOMENS) TABS Take 1 tablet by mouth every evening.    [provider]  potassium chloride SA (KLOR-CON) 20 MEQ tablet TAKE 1 TABLET BY MOUTH 2 TIMES DAILY 10/14/20   Hilty, Nadean Corwin, MD  Rivaroxaban (XARELTO) 15 MG TABS tablet TAKE 1 TABLET BY MOUTH EVERY DAY AT [facility] 08/03/21   Hilty, Nadean Corwin, MD    Family History    Family History  Problem Relation Age of Onset   Heart disease Mother    Heart disease Father    Hypertension Brother  Diabetes Sister    Colon cancer Neg Hx    Pancreatic cancer Neg Hx    Rectal cancer Neg Hx    Stomach cancer Neg Hx    She indicated that her mother is deceased. She indicated that her father is deceased. She indicated that her sister is deceased. She indicated that the status of her brother is unknown. She indicated that the status of her neg hx is unknown.  Social History    Social History   Socioeconomic History   Marital status: Widowed    Spouse name: Not on file   Number of children: Not on file   Years of education: Not on file   Highest education level: Not on file  Occupational History   Not on file  Tobacco Use   Smoking status: Never   Smokeless tobacco: Never  Vaping Use   Vaping Use: Never used  Substance and Sexual Activity   Alcohol use: Yes    Comment: rarely   Drug use: Never   Sexual activity: Not on file  Other Topics Concern   Not on file  Social History Narrative   Widow, 2 sons.   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   No tobacco.  Rare alcohol.   No drugs.  Exercise: 4 times per week, about 36m.   Social Determinants of Health   Financial Resource Strain: Low Risk  (10/11/2020)   Overall Financial Resource Strain (CARDIA)    Difficulty of Paying Living Expenses: Not hard at all  Food Insecurity: No Food Insecurity (10/11/2020)   Hunger Vital Sign    Worried About Running Out of Food in the Last Year: Never true    Ran Out of Food in the Last Year: Never true  Transportation Needs: No  Transportation Needs (10/11/2020)   PRAPARE - THydrologist(Medical): No    Lack of Transportation (Non-Medical): No  Physical Activity: Insufficiently Active (10/11/2020)   Exercise Vital Sign    Days of Exercise per Week: 3 days    Minutes of Exercise per Session: 30 min  Stress: Not on file  Social Connections: Moderately Integrated (10/11/2020)   Social Connection and Isolation Panel [NHANES]    Frequency of Communication with Friends and Family: More than three times a week    Frequency of Social Gatherings with Friends and Family: More than three times a week    Attends Religious Services: More than 4 times per year    Active Member of [employer] Organizations: Yes    Attends CArchivistMeetings: More than 4 times per year    Marital Status: Widowed  Intimate Partner Violence: Not At Risk (10/11/2020)   Humiliation, Afraid, Rape, and Kick questionnaire    Fear of Current or Ex-Partner: No    Emotionally Abused: No    Physically Abused: No    Sexually Abused: No     Review of Systems    General:  No chills, fever, night sweats or weight changes.  Cardiovascular:  No chest pain, dyspnea on exertion, edema, orthopnea, palpitations, paroxysmal nocturnal dyspnea. Dermatological: No rash, lesions/masses Respiratory: No cough, dyspnea Urologic: No hematuria, dysuria Abdominal:   No nausea, vomiting, diarrhea, bright red blood per rectum, melena, or hematemesis Neurologic:  No visual changes, wkns, changes in mental status. All other systems reviewed and are otherwise negative except as noted above.  Physical Exam    VS:  BP 136/68   Pulse 62   Ht 5' 1.5" (1.562 m)  Wt 172 lb 12.8 oz (78.4 kg)   LMP  (LMP Unknown)   SpO2 98%   BMI 32.12 kg/m  , BMI Body mass index is 32.12 kg/m. GEN: Well nourished, well developed, in no acute distress. HEENT: normal. Neck: Supple, no JVD, carotid bruits, or masses. Cardiac: RRR, no murmurs, rubs, or  gallops. No clubbing, cyanosis, edema.  Radials/DP/PT 2+ and equal bilaterally.  Respiratory:  Respirations regular and unlabored, clear to auscultation bilaterally. GI: Soft, nontender, nondistended, BS + x 4. MS: no deformity or atrophy. Skin: warm and dry, no rash. Neuro:  Strength and sensation are intact. Psych: Normal affect.  Accessory Clinical Findings    Recent Labs: 09/18/2020: Magnesium 2.0 09/24/2020: Hemoglobin 11.7; NT-Pro BNP 2,759; Platelets 460 05/06/2021: ALT 24; BUN 23; Creatinine, Ser 1.02; Potassium 4.4; Sodium 142   Recent Lipid Panel    Component Value Date/Time   CHOL 159 05/06/2021 0936   TRIG 75.0 05/06/2021 0936   HDL 72.20 05/06/2021 0936   CHOLHDL 2 05/06/2021 0936   VLDL 15.0 05/06/2021 0936   LDLCALC 72 05/06/2021 0936   LDLDIRECT 66.0 09/10/2018 0913    ECG personally reviewed by me today-normal sinus rhythm left axis deviation anterior infarct undetermined age 48 bpm- No acute changes  Echocardiogram 08/10/2017  Study Conclusions   - Left ventricle: The cavity size was normal. Wall thickness was    increased in a pattern of mild LVH. Systolic function was normal.    The estimated ejection fraction was in the range of 50% to 55%.    Wall motion was normal; there were no regional wall motion    abnormalities.  - Aortic valve: There was trivial regurgitation.  - Aortic root: The aortic root was mildly dilated.  - Mitral valve: There was mild regurgitation.  - Left atrium: The atrium was severely dilated.  - Right atrium: The atrium was mildly dilated.  - Tricuspid valve: There was mild-moderate regurgitation.  - Pulmonary arteries: PA peak pressure: 32 mm Hg (S).  - Pericardium, extracardiac: A trivial pericardial effusion was    identified.   Impressions:   - Normal LV systolic function; mild LVH; trace AI; mildly dilated    aortic root; mild MR; biatrial enlargement; mild to moderate TR.  Assessment & Plan   1.  Paroxysmal atrial  fibrillation-heart rate today 62 .  Denies recent episodes of increased or accelerated heartbeat.  Beta-blocker previously stopped due to low blood pressure chest Vascor 3.  Denies bleeding issues. Continue Xarelto, metoprolol Heart healthy low-sodium diet-salty 6 given Increase physical activity as tolerated  Chronic diastolic CHF-euvolemic today.  No increased DOE or activity intolerance.  Left leg chronically larger than right. Continue hydralazine, metoprolol, furosemide, potassium Heart healthy low-sodium diet-salty 6 given Increase physical activity as tolerated  Primary hypertension-BP today 136/68.  Well-controlled at home. Continue metoprolol, and pain Heart healthy low-sodium diet-salty 6 given Increase physical activity as tolerated  Hyperlipidemia-05/06/2021: Cholesterol 159; HDL 72.20; LDL Cholesterol 72; Triglycerides 75.0; VLDL 15.0 Continue atorvastatin Heart healthy low-sodium diet Increase physical activity as tolerated  CKD stage III-creatinine 1.02 Follows with PCP  Disposition: Follow-up with Dr. Debara Pickett or APP in 6 months.   Jossie Ng. Amel Gianino NP-C     09/06/2021, 8:38 AM [address] Office [phone] Fax [phone]  Notice: This dictation was prepared with Dragon dictation along with smaller phrase technology. Any transcriptional errors that result from this process are unintentional and may not be corrected upon  review.  I spent 13 minutes examining this patient, reviewing medications, and using patient centered shared decision making involving her cardiac care.  Prior to her visit I spent greater than 20 minutes reviewing her past medical history,  medications, and prior cardiac tests.

## 2021-09-06 ENCOUNTER — Encounter: Payer: Self-pay | Admitting: General Practice

## 2021-09-06 ENCOUNTER — Ambulatory Visit: Payer: Medicare PPO | Admitting: General Practice

## 2021-09-06 VITALS — BP 136/68 | HR 62 | Ht 61.5 in | Wt 172.8 lb

## 2021-09-06 DIAGNOSIS — I5032 Chronic diastolic (congestive) heart failure: Secondary | ICD-10-CM

## 2021-09-06 DIAGNOSIS — E785 Hyperlipidemia, unspecified: Secondary | ICD-10-CM | POA: Diagnosis not present

## 2021-09-06 DIAGNOSIS — I10 Essential (primary) hypertension: Secondary | ICD-10-CM

## 2021-09-06 DIAGNOSIS — I480 Paroxysmal atrial fibrillation: Secondary | ICD-10-CM

## 2021-09-06 DIAGNOSIS — N183 Chronic kidney disease, stage 3 unspecified: Secondary | ICD-10-CM

## 2021-09-06 NOTE — Patient Instructions (Signed)
Medication Instructions:  The current medical regimen is effective;  continue present plan and medications as directed. Please refer to the Current Medication list given to you today.  *If you need a refill on your cardiac medications before your next appointment, please call your pharmacy*  Lab Work:   Testing/Procedures:  NONE    NONE  If you have labs (blood work) drawn today and your tests are completely normal, you will receive your results only by:  1-MyChart Message (if you have MyChart) OR 2- A paper copy in the mail.  If you have any lab test that is abnormal or we need to change your treatment, we will call you to review the results.  Special Instructions PLEASE READ AND FOLLOW SALTY 6-ATTACHED-1,845m daily  PLEASE MAINTAINPHYSICAL ACTIVITY AS TOLERATED   Follow-Up: Your next appointment:  6 month(s) In Person with KPixie Casino MD   Please call our office 2 months in advance to schedule this appointment  :1  [facility] you and your health needs are our priority.  As part of our continuing mission to provide you with exceptional heart care, we have created designated Provider Care Teams.  These Care Teams include your primary Cardiologist (physician) and Advanced Practice Providers (APPs -  Physician Assistants and Nurse Practitioners) who all work together to provide you with the care you need, when you need it.  We recommend signing up for the patient portal called "MyChart".  Sign up information is provided on this After Visit Summary.  MyChart is used to connect with patients for Virtual Visits (Telemedicine).  Patients are able to view lab/test results, encounter notes, upcoming appointments, etc.  Non-urgent messages can be sent to your provider as well.   To learn more about what you can do with MyChart, go to [email or web address]    Important Information About Sugar             6 SALTY THINGS TO AVOID     1,800MG DAILY

## 2021-09-10 ENCOUNTER — Ambulatory Visit: Payer: Medicare PPO | Admitting: Internal Medicine

## 2021-09-17 ENCOUNTER — Other Ambulatory Visit: Payer: Self-pay | Admitting: Family Medicine

## 2021-09-17 NOTE — Telephone Encounter (Signed)
Requesting: alprazolam Contract: 07/10/19 UDS: N/A Last Visit: 05/06/2021 Next Visit: 6 mo f/u CPE Last Refill: 04/05/2021(90,5)  Please Advise. Med pending

## 2021-09-22 ENCOUNTER — Other Ambulatory Visit: Payer: Self-pay | Admitting: Family Medicine

## 2021-10-14 ENCOUNTER — Telehealth: Payer: Self-pay

## 2021-10-14 NOTE — Telephone Encounter (Signed)
Called pt to see if she would like to do her AWV earlier than scheduled. If agreed, add to [employer] schedule.

## 2021-10-21 ENCOUNTER — Other Ambulatory Visit: Payer: Self-pay | Admitting: Internal Medicine

## 2021-10-27 ENCOUNTER — Ambulatory Visit (INDEPENDENT_AMBULATORY_CARE_PROVIDER_SITE_OTHER): Payer: Medicare PPO

## 2021-10-27 VITALS — Wt 172.0 lb

## 2021-10-27 DIAGNOSIS — Z0000 Encounter for general adult medical examination without abnormal findings: Secondary | ICD-10-CM | POA: Diagnosis not present

## 2021-10-27 DIAGNOSIS — Z1231 Encounter for screening mammogram for malignant neoplasm of breast: Secondary | ICD-10-CM | POA: Diagnosis not present

## 2021-10-27 NOTE — Patient Instructions (Signed)
Brenda Matthews , Thank you for taking time to come for your Medicare Wellness Visit. I appreciate your ongoing commitment to your health goals. Please review the following plan we discussed and let me know if I can assist you in the future.   These are the goals we discussed:  Goals      continue exercising     Patient Stated     Maintain health by staying active.      Weight (lb) < 140 lb (63.5 kg)     Lose weight by continuing Weight Watchers and increasing activity.          This is a list of the screening recommended for you and due dates:  Health Maintenance  Topic Date Due   Zoster (Shingles) Vaccine (1 of 2) Never done   Colon Cancer Screening  05/30/2016   COVID-19 Vaccine (5 - Moderna risk series) 02/05/2021   Flu Shot  08/17/2021   Mammogram  10/01/2021   Tetanus Vaccine  12/11/2028   Pneumonia Vaccine  Completed   DEXA scan (bone density measurement)  Completed   HPV Vaccine  Aged Out    Advanced directives: Please bring a copy of your health care power of attorney and living will to the office at your convenience.  Conditions/risks identified: keep exercising   Next appointment: Follow up in one year for your annual wellness visit    Preventive Care 65 Years and Older, Female Preventive care refers to lifestyle choices and visits with your health care provider that can promote health and wellness. What does preventive care include? A yearly physical exam. This is also called an annual well check. Dental exams once or twice a year. Routine eye exams. Ask your health care provider how often you should have your eyes checked. Personal lifestyle choices, including: Daily care of your teeth and gums. Regular physical activity. Eating a healthy diet. Avoiding tobacco and drug use. Limiting alcohol use. Practicing safe sex. Taking low-dose aspirin every day. Taking vitamin and mineral supplements as recommended by your health care provider. What happens during an  annual well check? The services and screenings done by your health care provider during your annual well check will depend on your age, overall health, lifestyle risk factors, and family history of disease. Counseling  Your health care provider may ask you questions about your: Alcohol use. Tobacco use. Drug use. Emotional well-being. Home and relationship well-being. Sexual activity. Eating habits. History of falls. Memory and ability to understand (cognition). Work and work Statistician. Reproductive health. Screening  You may have the following tests or measurements: Height, weight, and BMI. Blood pressure. Lipid and cholesterol levels. These may be checked every 5 years, or more frequently if you are over 65 years old. Skin check. Lung cancer screening. You may have this screening every year starting at age 75 if you have a 30-pack-year history of smoking and currently smoke or have quit within the past 15 years. Fecal occult blood test (FOBT) of the stool. You may have this test every year starting at age 58. Flexible sigmoidoscopy or colonoscopy. You may have a sigmoidoscopy every 5 years or a colonoscopy every 10 years starting at age 1. Hepatitis C blood test. Hepatitis B blood test. Sexually transmitted disease (STD) testing. Diabetes screening. This is done by checking your blood sugar (glucose) after you have not eaten for a while (fasting). You may have this done every 1-3 years. Bone density scan. This is done to screen for osteoporosis. You  may have this done starting at age 16. Mammogram. This may be done every 1-2 years. Talk to your health care provider about how often you should have regular mammograms. Talk with your health care provider about your test results, treatment options, and if necessary, the need for more tests. Vaccines  Your health care provider may recommend certain vaccines, such as: Influenza vaccine. This is recommended every year. Tetanus,  diphtheria, and acellular pertussis (Tdap, Td) vaccine. You may need a Td booster every 10 years. Zoster vaccine. You may need this after age 83. Pneumococcal 13-valent conjugate (PCV13) vaccine. One dose is recommended after age 36. Pneumococcal polysaccharide (PPSV23) vaccine. One dose is recommended after age 58. Talk to your health care provider about which screenings and vaccines you need and how often you need them. This information is not intended to replace advice given to you by your health care provider. Make sure you discuss any questions you have with your health care provider. Document Released: 01/30/2015 Document Revised: 09/23/2015 Document Reviewed: 11/04/2014 Elsevier Interactive Patient Education  2017 [employer] Prevention in the Home Falls can cause injuries. They can happen to people of all ages. There are many things you can do to make your home safe and to help prevent falls. What can I do on the outside of my home? Regularly fix the edges of walkways and driveways and fix any cracks. Remove anything that might make you trip as you walk through a door, such as a raised step or threshold. Trim any bushes or trees on the path to your home. Use bright outdoor lighting. Clear any walking paths of anything that might make someone trip, such as rocks or tools. Regularly check to see if handrails are loose or broken. Make sure that both sides of any steps have handrails. Any raised decks and porches should have guardrails on the edges. Have any leaves, snow, or ice cleared regularly. Use sand or salt on walking paths during winter. Clean up any spills in your garage right away. This includes oil or grease spills. What can I do in the bathroom? Use night lights. Install grab bars by the toilet and in the tub and shower. Do not use towel bars as grab bars. Use non-skid mats or decals in the tub or shower. If you need to sit down in the shower, use a plastic,  non-slip stool. Keep the floor dry. Clean up any water that spills on the floor as soon as it happens. Remove soap buildup in the tub or shower regularly. Attach bath mats securely with double-sided non-slip rug tape. Do not have throw rugs and other things on the floor that can make you trip. What can I do in the bedroom? Use night lights. Make sure that you have a light by your bed that is easy to reach. Do not use any sheets or blankets that are too big for your bed. They should not hang down onto the floor. Have a firm chair that has side arms. You can use this for support while you get dressed. Do not have throw rugs and other things on the floor that can make you trip. What can I do in the kitchen? Clean up any spills right away. Avoid walking on wet floors. Keep items that you use a lot in easy-to-reach places. If you need to reach something above you, use a strong step stool that has a grab bar. Keep electrical cords out of the way. Do not use floor  polish or wax that makes floors slippery. If you must use wax, use non-skid floor wax. Do not have throw rugs and other things on the floor that can make you trip. What can I do with my stairs? Do not leave any items on the stairs. Make sure that there are handrails on both sides of the stairs and use them. Fix handrails that are broken or loose. Make sure that handrails are as long as the stairways. Check any carpeting to make sure that it is firmly attached to the stairs. Fix any carpet that is loose or worn. Avoid having throw rugs at the top or bottom of the stairs. If you do have throw rugs, attach them to the floor with carpet tape. Make sure that you have a light switch at the top of the stairs and the bottom of the stairs. If you do not have them, ask someone to add them for you. What else can I do to help prevent falls? Wear shoes that: Do not have high heels. Have rubber bottoms. Are comfortable and fit you well. Are closed  at the toe. Do not wear sandals. If you use a stepladder: Make sure that it is fully opened. Do not climb a closed stepladder. Make sure that both sides of the stepladder are locked into place. Ask someone to hold it for you, if possible. Clearly mark and make sure that you can see: Any grab bars or handrails. First and last steps. Where the edge of each step is. Use tools that help you move around (mobility aids) if they are needed. These include: Canes. Walkers. Scooters. Crutches. Turn on the lights when you go into a dark area. Replace any light bulbs as soon as they burn out. Set up your furniture so you have a clear path. Avoid moving your furniture around. If any of your floors are uneven, fix them. If there are any pets around you, be aware of where they are. Review your medicines with your doctor. Some medicines can make you feel dizzy. This can increase your chance of falling. Ask your doctor what other things that you can do to help prevent falls. This information is not intended to replace advice given to you by your health care provider. Make sure you discuss any questions you have with your health care provider. Document Released: 10/30/2008 Document Revised: 06/11/2015 Document Reviewed: 02/07/2014 Elsevier Interactive Patient Education  2017 [employer].

## 2021-10-27 NOTE — Progress Notes (Signed)
I connected with  [name] on 10/27/21 by a audio enabled telemedicine application and verified that I am speaking with the correct person using two identifiers.  Patient Location: Home  Provider Location: Office/Clinic  I discussed the limitations of evaluation and management by telemedicine. The patient expressed understanding and agreed to proceed.   Subjective:   Brenda Matthews is a 86 y.o. female who presents for Medicare Annual (Subsequent) preventive examination.  Review of Systems     Cardiac Risk Factors include: advanced age (>15mn, >>60women);dyslipidemia;hypertension;obesity (BMI >30kg/m2)     Objective:    Today's Vitals   10/27/21 1334  Weight: 172 lb (78 kg)   Body mass index is 31.97 kg/m.     10/27/2021    1:38 PM 10/11/2020    2:26 PM 09/14/2020    1:37 PM 09/14/2020    1:59 AM 12/12/2018    7:37 PM 09/26/2018    1:10 PM 09/21/2018    9:42 AM  Advanced Directives  Does Patient Have a Medical Advance Directive? Yes Yes No No Yes Yes Yes  Type of AParamedicof ALorimorLiving will Living will;Healthcare Power of [place] Does patient want to make changes to medical advance directive?  No - Patient declined    No - Patient declined   Copy of HMeridianin Chart? No - copy requested No - copy requested    No - copy requested   Would patient like information on creating a medical advance directive?   No - Patient declined        Current Medications (verified) Outpatient Encounter Medications as of 10/27/2021  Medication Sig   acetaminophen (TYLENOL) 650 MG CR tablet Take 650 mg by mouth in the morning and at bedtime.   albuterol (VENTOLIN HFA) 108 (90 Base) MCG/ACT inhaler INHALE 2 PUFFS BY MOUTH into THE lungs EVERY 4 HOURS AS NEEDED FOR WHEEZING OR SHORTNESS OF BREATH   ALPRAZolam (XANAX) 0.5 MG tablet TAKE 1 TABLET BY MOUTH 3 TIMES DAILY AS NEEDED FOR  ANXIETY   amLODipine (NORVASC) 10 MG tablet TAKE 1 TABLET BY MOUTH EVERY DAY   Apoaequorin (PREVAGEN PO) Take 1 tablet by mouth daily.   Ascorbic Acid (VITAMIN C PO) Take by mouth daily.   atorvastatin (LIPITOR) 40 MG tablet TAKE 1 TABLET BY MOUTH EVERY DAY   Biotin 5000 MCG TABS Take 5,000 mcg by mouth daily.   Calcium Carbonate (CALCIUM 600 PO) Take 1,200 mg by mouth daily.    carboxymethylcellulose (REFRESH PLUS) 0.5 % SOLN Place 2 drops into both eyes daily as needed (dry/irritated eyes.).   Coenzyme Q10 200 MG capsule Take 200 mg by mouth daily.   fluticasone (FLONASE) 50 MCG/ACT nasal spray Place 2 sprays into both nostrils daily.    furosemide (LASIX) 40 MG tablet TAKE 1 TABLET BY MOUTH EVERY DAY   hydrALAZINE (APRESOLINE) 50 MG tablet TAKE 1 TABLET BY MOUTH 3 TIMES DAILY   metoprolol tartrate (LOPRESSOR) 100 MG tablet TAKE 1 TABLET BY MOUTH 2 TIMES DAILY   Multiple Vitamins-Minerals (CENTRUM SILVER ULTRA WOMENS) TABS Take 1 tablet by mouth every evening.   potassium chloride SA (KLOR-CON M) 20 MEQ tablet TAKE 1 TABLET BY MOUTH 2 TIMES DAILY   Rivaroxaban (XARELTO) 15 MG TABS tablet TAKE 1 TABLET BY MOUTH EVERY DAY AT NOON   No facility-administered encounter medications on file as of 10/27/2021.  Allergies (verified) Augmentin [amoxicillin-pot clavulanate], Amoxicillin, and Clindamycin/lincomycin   History: Past Medical History:  Diagnosis Date   Abnormal EKG approx 2008   Nuclear stress test neg;    Acute pancreatitis 09/2020   idiopathic.  +panc psudocyst   Anxiety    with panic   CAP (community acquired pneumonia) 08/2017   Hospitalization for CAP/acute diast HF/rapid a-fib   Cataract    s/p surgery--lens implants   Chronic diastolic heart failure ([place]) 2019   Chronic renal insufficiency, stage 3 (moderate) ([place]) 12/04/2012   Renal u/s when in hosp 08/2017 for CAP/CHF showed symmetric kidneys, echogenicity normal, w/out hydronephrosis. Baseline GFR around 40 ml/min  as of 10/2018.   DDD (degenerative disc disease), lumbar    Diverticulosis 2009   Fracture of radial shaft, left, closed 11/16/2010   fell down flight of stairs   History of kidney stones    Hx of adenomatous colonic polyps 2002;2009;2015   surveillance colonoscopy 2009, +polypectomy done-tubular adenoma w/out high grade.  05/2013 tubular adenomas--recall 3 yrs   Hyperlipidemia    Hypertension    Low TSH level 02/18/2016   T3 norm, T4 mildly elevated--suspected sick euthyroid syndrome.  Repeat labs 06/2016: normal.   Melanoma in situ ([place]) 06/2018   L LL   Osteoarthritis of both knees    viscosupplementation injections helpful 2020/21   Osteopenia    DEXA 08/2010; repeat DEXA 02/2015 worse: fosamax started.  06/2018 Dexa T score -2.4.  2020 maj osteop fx risk = 24%, Hip fx risk 7.3%. 09/2020 T score -2.1   PAF (paroxysmal atrial fibrillation) ([place]) 02/2016   when in post-op for ankle surgery; spontaneously converted in hosp, seen by Dr. Debara Pickett in consultation--metoprolol rate control + xarelto recommended.  Metop d/c due to hypot.  Plan to cont xarelto 20 mg qd indef due to CHAD-VASc score of 3.  A-fib w/RVR and CHF 07/2017; pt placed on amiodarone and plan for CV, but pt was in sinus rhythm when she went in for her DC CV, so she was sent home.   Peripheral edema    Pneumonia 2015   hx with sepsis   Rheumatic fever    Past Surgical History:  Procedure Laterality Date   APPENDECTOMY  1966   done during surgery for tubal pregnancy   CATARACT EXTRACTION W/ INTRAOCULAR LENS IMPLANT  2013   bilat   COLONOSCOPY W/ POLYPECTOMY  05/2013   +diverticulosis; recall 3 yrs (Dr. Henrene Pastor)   DEXA  02/2015; 06/2018   T score -2.1 in both femoral necks; FRAX 10 yr risk of major osteoporotic fracture was 21%---fosamax started. 06/2018 T score -2.4.  T score 09/2020 -2.1. Rpt 2 yrs.   ECTOPIC PREGNANCY SURGERY     EYE SURGERY     LEFT HEART CATH AND CORONARY ANGIOGRAPHY N/A 09/01/2017   No angiographically  significant CAD.  Upper normal left ventricular filling pressure.  Procedure: LEFT HEART CATH AND CORONARY ANGIOGRAPHY;  Surgeon: Nelva Bush, MD;  Location: [facility];  Service: Cardiovascular;  Laterality: N/A;   LUMBAR LAMINECTOMY/DECOMPRESSION MICRODISCECTOMY N/A 09/26/2018   Procedure: Decompressive Lumbar Laminectomy L5 S1 FORAMINOTOMY L5 S1  NERVE ROOT BILATERALLY and Microdiscectomy L5-S1 Left;  Surgeon: Latanya Maudlin, MD;  Location: [facility];  Service: Orthopedics;  Laterality: N/A;  135mn   OPEN REDUCTION INTERNAL FIXATION (ORIF) TIBIA/FIBULA FRACTURE Left 02/18/2016   Procedure: OPEN REDUCTION INTERNAL FIXATION (ORIF) Right ankle trimalleolar fracture;  Surgeon: JWylene Simmer MD;  Location: [facility]  Service: Orthopedics;  Laterality:  Left;  requests 19mns   ORIF RADIAL FRACTURE  11/18/2010   left; s/p slip on slippery floor and fell   THORACENTESIS  08/2017   diagnostic and therapeutic.  Transudative.  Clx neg.  (+pulm edema/diastolic HF)   TONSILLECTOMY     TRANSTHORACIC ECHOCARDIOGRAM  02/18/2016; 08/10/17   LVEF of 55-60%, mild AI and mild MR and normal biatrial size.  07/2017--normal LV function, mild enlarge aortic root, mild/mod TR, bilat atrial enlargement.   Family History  Problem Relation Age of Onset   Heart disease Mother    Heart disease Father    Hypertension Brother    Diabetes Sister    Colon cancer Neg Hx    Pancreatic cancer Neg Hx    Rectal cancer Neg Hx    Stomach cancer Neg Hx    Social History   Socioeconomic History   Marital status: Widowed    Spouse name: Not on file   Number of children: Not on file   Years of education: Not on file   Highest education level: Not on file  Occupational History   Not on file  Tobacco Use   Smoking status: Never   Smokeless tobacco: Never  Vaping Use   Vaping Use: Never used  Substance and Sexual Activity   Alcohol use: Yes    Comment: rarely   Drug use: Never   Sexual activity: Not on file   Other Topics Concern   Not on file  Social History Narrative   Widow, 2 sons.   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  Rare alcohol.   No drugs.  Exercise: 4 times per week, about 418m   Social Determinants of Health   Financial Resource Strain: Low Risk  (10/27/2021)   Overall Financial Resource Strain (CARDIA)    Difficulty of Paying Living Expenses: Not hard at all  Food Insecurity: No Food Insecurity (10/27/2021)   Hunger Vital Sign    Worried About Running Out of Food in the Last Year: Never true    Ran Out of Food in the Last Year: Never true  Transportation Needs: No Transportation Needs (10/27/2021)   PRAPARE - TrHydrologistMedical): No    Lack of Transportation (Non-Medical): No  Physical Activity: Sufficiently Active (10/27/2021)   Exercise Vital Sign    Days of Exercise per Week: 5 days    Minutes of Exercise per Session: 30 min  Stress: No Stress Concern Present (10/27/2021)   [place]  Feeling of Stress : Not at all  Social Connections: Moderately Integrated (10/27/2021)   Social Connection and Isolation Panel [NHANES]    Frequency of Communication with Friends and Family: More than three times a week    Frequency of Social Gatherings with Friends and Family: More than three times a week    Attends Religious Services: More than 4 times per year    Active Member of [employer] Organizations: Yes    Attends ClArchivisteetings: More than 4 times per year    Marital Status: Widowed    Tobacco Counseling Counseling given: Not Answered   Clinical Intake:  Pre-visit preparation completed: Yes  Pain : No/denies pain     BMI - recorded: 31.97 Nutritional Status: BMI > 30  Obese Nutritional Risks: None Diabetes: No  How often do you need to have someone help you when you read instructions, pamphlets, or other written materials from your  doctor  or pharmacy?: 1 - Never  Diabetic?no  Interpreter Needed?: No  Information entered by :: Charlott Rakes, LPN   Activities of Daily Living    10/27/2021    1:39 PM  In your present state of health, do you have any difficulty performing the following activities:  Hearing? 0  Vision? 0  Difficulty concentrating or making decisions? 0  Walking or climbing stairs? 0  Dressing or bathing? 0  Doing errands, shopping? 0  Preparing Food and eating ? N  Using the Toilet? N  In the past six months, have you accidently leaked urine? N  Do you have problems with loss of bowel control? N  Managing your Medications? N  Managing your Finances? N  Housekeeping or managing your Housekeeping? N    Patient Care Team: [name], MD as PCP - General (Family Medicine) Debara Pickett Nadean Corwin, MD as PCP - Cardiology (Cardiology) Iran Planas, MD as Consulting Physician (Orthopedic Surgery) Latanya Maudlin, MD as Consulting Physician (Orthopedic Surgery) Danella Sensing, MD as Consulting Physician (Dermatology) Irene Shipper, MD as Consulting Physician (Gastroenterology) Debara Pickett Nadean Corwin, MD as Consulting Physician (Cardiology) Wylene Simmer, MD as Consulting Physician (Orthopedic Surgery) Griselda Miner, MD as Consulting Physician (Dermatology)  Indicate any recent [place] you may have received from other than [facility] providers in the past year (date may be approximate).     Assessment:   This is a routine wellness examination for [place].  Hearing/Vision screen Hearing Screening - Comments:: Pt denies any hearing issues  Vision Screening - Comments:: Pt follows [facility]   Dietary issues and exercise activities discussed: Current Exercise Habits: Home exercise routine, Type of exercise: Other - see comments, Time (Minutes): 60, Frequency (Times/Week): 5, Weekly Exercise (Minutes/Week): 300   Goals Addressed             This Visit's Progress    continue  exercising         Depression Screen    10/27/2021    1:37 PM 12/18/2020    3:48 PM 10/11/2020    2:27 PM 05/21/2020    1:02 PM 07/10/2019   10:37 AM 08/28/2017   11:23 AM 08/01/2016    9:29 AM  PHQ 2/9 Scores  PHQ - 2 Score 0 0 0 0 0 0 0    Fall Risk    10/27/2021    1:39 PM 05/06/2021    9:13 AM 12/18/2020    3:48 PM 10/11/2020    2:27 PM 05/21/2020    1:02 PM  [place] in the past year? 0 0 0 0 0  Number falls in past yr: 0 0 0 0 0  Injury with Fall? 0 0 0 0 0  Risk for fall due to : Impaired balance/gait;Impaired vision Impaired vision  History of fall(s) No Fall Risks  Follow up Falls prevention discussed Falls evaluation completed Falls evaluation completed Falls evaluation completed Falls evaluation completed    [place]:  Any stairs in or around the home? No  If so, are there any without handrails? No  Home free of loose throw rugs in walkways, pet beds, electrical cords, etc? Yes  Adequate lighting in your home to reduce risk of falls? Yes   ASSISTIVE DEVICES UTILIZED TO PREVENT FALLS:  Life alert? Yes  Use of a cane, walker or w/c? Yes  Grab bars in the bathroom? Yes  Shower chair or bench in shower? Yes  Elevated toilet  seat or a handicapped toilet? Yes   TIMED UP AND GO:  Was the test performed? No .   Cognitive Function:    08/28/2017   11:23 AM  MMSE - Mini Mental State Exam  Orientation to time 5  Orientation to Place 5  Registration 3  Attention/ Calculation 5  Recall 2  Language- name 2 objects 2  Language- repeat 1  Language- follow 3 step command 3  Language- read & follow direction 1  Write a sentence 1  Copy design 1  Total score 29        10/27/2021    1:40 PM 10/11/2020    2:29 PM  6CIT Screen  What Year? 0 points 0 points  What month? 0 points 0 points  What time? 0 points 0 points  Count back from 20 0 points 0 points  Months in reverse 0 points 0 points  Repeat phrase 0 points 0  points  Total Score 0 points 0 points    Immunizations Immunization History  Administered Date(s) Administered   Fluad Quad(high Dose 65+) 09/27/2018, 10/10/2019, 10/28/2020   H1N1 01/07/2008   Hepatitis A 08/06/2010   IPV 08/06/2010   Influenza Split 11/01/2010, 11/15/2011   Influenza Whole 12/27/2006, 11/07/2007, 10/31/2008, 12/29/2009   Influenza, High Dose Seasonal PF 11/01/2010, 11/15/2011, 11/20/2013, 10/17/2014, 09/30/2015, 09/26/2016, 10/04/2017, 09/27/2018   Influenza,inj,Quad PF,6+ Mos 11/20/2013   Influenza-Unspecified 09/30/2015   Meningococcal Conjugate 08/06/2010   Moderna Sars-Covid-2 Vaccination 12/18/2018, 01/18/2019, 11/12/2019   Pfizer Covid-19 Vaccine Bivalent Booster 67yr & up 12/11/2020   Pneumococcal Conjugate-13 07/16/2013   Pneumococcal Polysaccharide-23 11/07/2007   Td 12/10/2008   Td (Adult), 2 Lf Tetanus Toxid, Preservative Free 12/10/2008   Tdap 12/12/2018   Typhoid Live 08/06/2010   Yellow Fever 08/06/2010   Zoster, Live 04/05/2007    TDAP status: Up to date  Flu Vaccine status: Due, Education has been provided regarding the importance of this vaccine. Advised may receive this vaccine at local pharmacy or Health Dept. Aware to provide a copy of the vaccination record if obtained from local pharmacy or Health Dept. Verbalized acceptance and understanding.  Pneumococcal vaccine status: Up to date  Covid-19 vaccine status: Completed vaccines  Qualifies for Shingles Vaccine? Yes   Zostavax completed No   Shingrix Completed?: No.    Education has been provided regarding the importance of this vaccine. Patient has been advised to call insurance company to determine out of pocket expense if they have not yet received this vaccine. Advised may also receive vaccine at local pharmacy or Health Dept. Verbalized acceptance and understanding.  Screening Tests Health Maintenance  Topic Date Due   Zoster Vaccines- Shingrix (1 of 2) Never done   COVID-19  Vaccine (5 - Moderna risk series) 02/05/2021   INFLUENZA VACCINE  08/17/2021   MAMMOGRAM  10/01/2021   COLONOSCOPY (Pts 45-469yrInsurance coverage will need to be confirmed)  10/28/2022 (Originally 05/30/2016)   TETANUS/TDAP  12/11/2028   Pneumonia Vaccine 6558Years old  Completed   DEXA SCAN  Completed   HPV VACCINES  Aged Out    Health Maintenance  Health Maintenance Due  Topic Date Due   Zoster Vaccines- Shingrix (1 of 2) Never done   COVID-19 Vaccine (5 - Moderna risk series) 02/05/2021   INFLUENZA VACCINE  08/17/2021   MAMMOGRAM  10/01/2021    Colorectal cancer screening: No longer required.   Mammogram status: Ordered 10/27/21. Pt provided with contact info and advised to call to schedule appt.  Bone Density status: Completed 10/01/20. Results reflect: Bone density results: OSTEOPENIA. Repeat every 2 years.  Additional Screening:   Vision Screening: Recommended annual ophthalmology exams for early detection of glaucoma and other disorders of the eye. Is the patient up to date with their annual eye exam?  Yes  Who is the provider or what is the name of the office in which the patient attends annual eye exams? [facility]   If pt is not established with a provider, would they like to be referred to a provider to establish care? No .   Dental Screening: Recommended annual dental exams for proper oral hygiene  Community Resource Referral / Chronic Care Management: CRR required this visit?  No   CCM required this visit?  No      Plan:     I have personally reviewed and noted the following in the patient's chart:   Medical and social history Use of alcohol, tobacco or illicit drugs  Current medications and supplements including opioid prescriptions. Patient is not currently taking opioid prescriptions. Functional ability and status Nutritional status Physical activity Advanced directives List of other physicians Hospitalizations, surgeries, and ER  visits in previous 12 months Vitals Screenings to include cognitive, depression, and falls Referrals and appointments  In addition, I have reviewed and discussed with patient certain preventive protocols, quality metrics, and best practice recommendations. A written personalized care plan for preventive services as well as general preventive health recommendations were provided to patient.     Willette Brace, LPN   [id number]   Nurse Notes: none

## 2021-11-13 ENCOUNTER — Other Ambulatory Visit: Payer: Self-pay | Admitting: Family Medicine

## 2021-11-18 ENCOUNTER — Ambulatory Visit: Payer: Medicare PPO | Admitting: Internal Medicine

## 2021-11-30 ENCOUNTER — Telehealth: Payer: Self-pay

## 2021-11-30 MED ORDER — ALBUTEROL SULFATE HFA 108 (90 BASE) MCG/ACT IN AERS: INHALATION_SPRAY | RESPIRATORY_TRACT | 0 refills | Status: DC

## 2021-11-30 NOTE — Telephone Encounter (Signed)
Pt advised refill sent. She was offered to schedule follow up appt, appt scheduled. Advised to fast, if possible

## 2021-11-30 NOTE — Telephone Encounter (Signed)
Patient refill request.  [facility]  albuterol (VENTOLIN HFA) 108 (90 Base) MCG/ACT inhaler [[id number]]

## 2021-12-20 ENCOUNTER — Encounter: Payer: Self-pay | Admitting: *Deleted

## 2021-12-20 ENCOUNTER — Telehealth: Payer: Self-pay | Admitting: *Deleted

## 2021-12-20 NOTE — Patient Instructions (Signed)
Visit Information  Thank you for taking time to visit with me today. Please don't hesitate to contact me if I can be of assistance to you.   Following are the goals we discussed today:   Goals Addressed               This Visit's Progress     COMPLETED: No needs (pt-stated)        Care Coordination Interventions: Reviewed medications with patient and discussed adherence with no needed refills Reviewed scheduled/upcoming provider appointments including sufficient transportation source Assessed social determinant of health barriers          Please call the care guide team at [phone] if you need to cancel or reschedule your appointment.   If you are experiencing a Mental Health or [place] or need someone to talk to, please call the Suicide and Crisis Lifeline: 988 call the [place] National Suicide Prevention Lifeline: (216) 264-2195 or TTY: 249-085-6528 TTY 617 016 9096) to talk to a trained counselor call 1-800-273-TALK (toll free, 24 hour hotline)  The patient verbalized understanding of instructions, educational materials, and care plan provided today and DECLINED offer to receive copy of patient instructions, educational materials, and care plan.   No further follow up required: No needs    Raina Mina, RN Care Management Coordinator [place] Office [phone]

## 2021-12-20 NOTE — Patient Outreach (Signed)
  Care Coordination   Initial Visit Note   12/20/2021 Name: Brenda Matthews MRN: [id number] DOB: 1935-11-30  Brenda Matthews is a 86 y.o. year old female who sees McGowen, Adrian Blackwater, MD for primary care. I spoke with  Brenda Matthews by phone today.  What matters to the patients health and wellness today?  No needs    Goals Addressed               This Visit's Progress     COMPLETED: No needs (pt-stated)        Care Coordination Interventions: Reviewed medications with patient and discussed adherence with no needed refills Reviewed scheduled/upcoming provider appointments including sufficient transportation source Assessed social determinant of health barriers          SDOH assessments and interventions completed:  Yes  SDOH Interventions Today    Flowsheet Row Most Recent Value  SDOH Interventions   Food Insecurity Interventions Intervention Not Indicated  Housing Interventions Intervention Not Indicated  Transportation Interventions Intervention Not Indicated  Utilities Interventions Intervention Not Indicated        Care Coordination Interventions:  Yes, provided   Follow up plan: No further intervention required.   Encounter Outcome:  Pt. Visit Completed   Brenda Mina, RN Care Management Coordinator [place] Office [phone]

## 2021-12-21 ENCOUNTER — Ambulatory Visit: Payer: Medicare PPO | Admitting: Family Medicine

## 2021-12-21 NOTE — Progress Notes (Deleted)
OFFICE VISIT  12/21/2021  CC: No chief complaint on file.   Patient is a 86 y.o. female who presents for 37-monthfollow-up hypertension, hyperlipidemia, chronic renal insufficiency, and anxiety (high risk med use). A/P as of last visit: "1 hypertension, well controlled. Reassured patient that elevated BP today likely due to stress of this office visit plus not taking meds yet today. Home BPs reassuring. Continue Lopressor 100 twice daily, hydralazine 50 3 times daily, and amlodipine 10 mg a day. Electrolytes and creatinine today.  2.  Hyperlipidemia, doing well on atorvastatin 40 mg a day. LDL was 53 about 8 months ago. Lipid panel and hepatic panel today.  3.  Chronic renal insufficiency stage III. She avoids NSAIDs and hydrates well. Electrolytes and creatinine today.  4.  Generalized anxiety.  Long-term stability on Xanax 1 mg 3 times daily as needed. Controlled substance contract updated and urine drug screen obtained today.   #5 deconditioning.  I think this is responsible for her feeling of breathlessness with activity. Somewhere along the line I think she was prescribed albuterol for an acute bronchitic illness that she has continued to use this some.  I am not convinced that her breathlessness is due to bronchospasm/asthma. I told her to give her breathlessness 5 to 10 minutes to resolve before trying the albuterol. I have low suspicion that this is anything to do with heart failure, ischemic disease, or dysrhythmia."  INTERIM HX: All labs stable last visit. ***   PMP AWARE reviewed today: most recent rx for alprazolam was filled 09/17/2021, #90, rx by me. No red flags.  Past Medical History:  Diagnosis Date   Abnormal EKG approx 2008   Nuclear stress test neg;    Acute pancreatitis 09/2020   idiopathic.  +panc psudocyst   Anxiety    with panic   CAP (community acquired pneumonia) 08/2017   Hospitalization for CAP/acute diast HF/rapid a-fib   Cataract    s/p  surgery--lens implants   Chronic diastolic heart failure [place]   Chronic renal insufficiency, stage 3 (moderate) ([place] 12/04/2012   Renal u/s when in hosp 08/2017 for CAP/CHF showed symmetric kidneys, echogenicity normal, w/out hydronephrosis. Baseline GFR around 40 ml/min as of 10/2018.   DDD (degenerative disc disease), lumbar    Diverticulosis 2009   Fracture of radial shaft, left, closed 11/16/2010   fell down flight of stairs   History of kidney stones    Hx of adenomatous colonic polyps 2002;2009;2015   surveillance colonoscopy 2009, +polypectomy done-tubular adenoma w/out high grade.  05/2013 tubular adenomas--recall 3 yrs   Hyperlipidemia    Hypertension    Low TSH level 02/18/2016   T3 norm, T4 mildly elevated--suspected sick euthyroid syndrome.  Repeat labs 06/2016: normal.   Melanoma in situ ([place] 06/2018   L LL   Osteoarthritis of both knees    viscosupplementation injections helpful 2020/21   Osteopenia    DEXA 08/2010; repeat DEXA 02/2015 worse: fosamax started.  06/2018 Dexa T score -2.4.  2020 maj osteop fx risk = 24%, Hip fx risk 7.3%. 09/2020 T score -2.1   PAF ([place]oxysmal atrial fibrillation) ([place] 02/2016   when in post-op for ankle surgery; spontaneously converted in hosp, seen by Dr. HDebara Pickettin consultation--metoprolol rate control + xarelto recommended.  Metop d/c due to hypot.  Plan to cont xarelto 20 mg qd indef due to CHAD-VASc score of 3.  A-fib w/RVR and CHF 07/2017; pt placed on amiodarone and plan for CV, but pt was in sinus  rhythm when she went in for her DC CV, so she was sent home.   Peripheral edema    Pneumonia 2015   hx with sepsis   Rheumatic fever     Past Surgical History:  Procedure Laterality Date   APPENDECTOMY  1966   done during surgery for tubal pregnancy   CATARACT EXTRACTION W/ INTRAOCULAR LENS IMPLANT  2013   bilat   COLONOSCOPY W/ POLYPECTOMY  05/2013   +diverticulosis; recall 3 yrs (Dr. Henrene Pastor)   DEXA  02/2015; 06/2018   T score -2.1 in  both femoral necks; FRAX 10 yr risk of major osteoporotic fracture was 21%---fosamax started. 06/2018 T score -2.4.  T score 09/2020 -2.1. Rpt 2 yrs.   ECTOPIC PREGNANCY SURGERY     EYE SURGERY     LEFT HEART CATH AND CORONARY ANGIOGRAPHY N/A 09/01/2017   No angiographically significant CAD.  Upper normal left ventricular filling pressure.  Procedure: LEFT HEART CATH AND CORONARY ANGIOGRAPHY;  Surgeon: Nelva Bush, MD;  Location: [facility];  Service: Cardiovascular;  Laterality: N/A;   LUMBAR LAMINECTOMY/DECOMPRESSION MICRODISCECTOMY N/A 09/26/2018   Procedure: Decompressive Lumbar Laminectomy L5 S1 FORAMINOTOMY L5 S1  NERVE ROOT BILATERALLY and Microdiscectomy L5-S1 Left;  Surgeon: Latanya Maudlin, MD;  Location: [facility];  Service: Orthopedics;  Laterality: N/A;  154mn   OPEN REDUCTION INTERNAL FIXATION (ORIF) TIBIA/FIBULA FRACTURE Left 02/18/2016   Procedure: OPEN REDUCTION INTERNAL FIXATION (ORIF) Right ankle trimalleolar fracture;  Surgeon: JWylene Simmer MD;  [place] [facility]  Service: Orthopedics;  Laterality: Left;  requests 99ms   ORIF RADIAL FRACTURE  11/18/2010   left; s/p slip on slippery floor and fell   THORACENTESIS  08/2017   diagnostic and therapeutic.  Transudative.  Clx neg.  (+pulm edema/diastolic HF)   TONSILLECTOMY     TRANSTHORACIC ECHOCARDIOGRAM  02/18/2016; 08/10/17   LVEF of 55-60%, mild AI and mild MR and normal biatrial size.  07/2017--normal LV function, mild enlarge aortic root, mild/mod TR, bilat atrial enlargement.    Outpatient Medications Prior to Visit  Medication Sig Dispense Refill   acetaminophen (TYLENOL) 650 MG CR tablet Take 650 mg by mouth in the morning and at bedtime.     albuterol (VENTOLIN HFA) 108 (90 Base) MCG/ACT inhaler INHALE 2 PUFFS BY MOUTH into THE lungs EVERY 4 HOURS AS NEEDED FOR WHEEZING OR SHORTNESS OF BREATH 18 g 0   ALPRAZolam (XANAX) 0.5 MG tablet TAKE 1 TABLET BY MOUTH 3 TIMES DAILY AS NEEDED FOR ANXIETY 90 tablet 5    amLODipine (NORVASC) 10 MG tablet TAKE 1 TABLET BY MOUTH EVERY DAY 90 tablet 1   Apoaequorin (PREVAGEN PO) Take 1 tablet by mouth daily.     Ascorbic Acid (VITAMIN C PO) Take by mouth daily.     atorvastatin (LIPITOR) 40 MG tablet TAKE 1 TABLET BY MOUTH EVERY DAY 90 tablet 1   Biotin 5000 MCG TABS Take 5,000 mcg by mouth daily.     Calcium Carbonate (CALCIUM 600 PO) Take 1,200 mg by mouth daily.      carboxymethylcellulose (REFRESH PLUS) 0.5 % SOLN Place 2 drops into both eyes daily as needed (dry/irritated eyes.).     Coenzyme Q10 200 MG capsule Take 200 mg by mouth daily.     fluticasone (FLONASE) 50 MCG/ACT nasal spray Place 2 sprays into both nostrils daily.      furosemide (LASIX) 40 MG tablet TAKE 1 TABLET BY MOUTH EVERY DAY 90 tablet 1   hydrALAZINE (APRESOLINE) 50 MG tablet  TAKE 1 TABLET BY MOUTH 3 TIMES DAILY 270 tablet 1   metoprolol tartrate (LOPRESSOR) 100 MG tablet TAKE 1 TABLET BY MOUTH 2 TIMES DAILY 180 tablet 2   Multiple Vitamins-Minerals (CENTRUM SILVER ULTRA WOMENS) TABS Take 1 tablet by mouth every evening.     potassium chloride SA (KLOR-CON M) 20 MEQ tablet TAKE 1 TABLET BY MOUTH 2 TIMES DAILY 180 tablet 3   Rivaroxaban (XARELTO) 15 MG TABS tablet TAKE 1 TABLET BY MOUTH EVERY DAY AT NOON 90 tablet 1   No facility-administered medications prior to visit.    Allergies  Allergen Reactions   Augmentin [Amoxicillin-Pot Clavulanate] Nausea And Vomiting and Other (See Comments)    "projectile vomiting" Has patient had a PCN reaction causing immediate rash, facial/tongue/throat swelling, SOB or lightheadedness with hypotension:No Has patient had a PCN reaction causing severe rash involving mucus membranes or skin necrosis:No Has patient had a PCN reaction that required hospitalization:No Has patient had a PCN reaction occurring within the last 10 years:Yes If all of the above answers are "NO", then may proceed with Cephalosporin use.    Amoxicillin Rash    Clindamycin/Lincomycin Rash    ROS As per HPI  PE:    10/27/2021    1:34 PM 09/06/2021    8:16 AM 05/06/2021    9:03 AM  Vitals with BMI  Height  5' 1.5" 5' 1.5"  Weight 172 lbs 172 lbs 13 oz 171 lbs 13 oz  BMI  81.44 81.85  Systolic  631 497  Diastolic  68 76  Pulse  62 59   Physical Exam  ***  LABS:  Last CBC Lab Results  Component Value Date   WBC 8.2 09/24/2020   HGB 11.7 09/24/2020   HCT 36.4 09/24/2020   MCV 90 09/24/2020   MCH 29.0 09/24/2020   RDW 13.8 09/24/2020   PLT 460 (H) 02/63/7858   Last metabolic panel Lab Results  Component Value Date   GLUCOSE 91 05/06/2021   NA 142 05/06/2021   K 4.4 05/06/2021   CL 105 05/06/2021   CO2 28 05/06/2021   BUN 23 05/06/2021   CREATININE 1.02 05/06/2021   EGFR 67 09/24/2020   CALCIUM 9.4 05/06/2021   PHOS 2.3 (L) 09/18/2020   PROT 6.7 05/06/2021   ALBUMIN 4.2 05/06/2021   LABGLOB 2.4 09/24/2020   AGRATIO 1.5 09/24/2020   BILITOT 0.9 05/06/2021   ALKPHOS 78 05/06/2021   AST 21 05/06/2021   ALT 24 05/06/2021   ANIONGAP 6 09/18/2020   Last lipids Lab Results  Component Value Date   CHOL 159 05/06/2021   HDL 72.20 05/06/2021   LDLCALC 72 05/06/2021   LDLDIRECT 66.0 09/10/2018   TRIG 75.0 05/06/2021   CHOLHDL 2 05/06/2021   Last thyroid functions Lab Results  Component Value Date   TSH 0.68 10/22/2018   T3TOTAL 115.0 07/04/2016   IMPRESSION AND PLAN:  No problem-specific Assessment & Plan notes found for this encounter.   An After Visit Summary was printed and given to the patient.  FOLLOW UP: No follow-ups on file.  Signed:  Crissie Sickles, MD           12/21/2021

## 2022-01-05 ENCOUNTER — Other Ambulatory Visit: Payer: Self-pay | Admitting: Family Medicine

## 2022-01-05 ENCOUNTER — Ambulatory Visit: Payer: Medicare PPO | Admitting: Family Medicine

## 2022-01-05 ENCOUNTER — Encounter: Payer: Self-pay | Admitting: Family Medicine

## 2022-01-05 VITALS — BP 152/76 | HR 68 | Temp 97.7°F | Ht 61.5 in | Wt 179.2 lb

## 2022-01-05 DIAGNOSIS — F411 Generalized anxiety disorder: Secondary | ICD-10-CM

## 2022-01-05 DIAGNOSIS — I10 Essential (primary) hypertension: Secondary | ICD-10-CM | POA: Diagnosis not present

## 2022-01-05 DIAGNOSIS — N1831 Chronic kidney disease, stage 3a: Secondary | ICD-10-CM | POA: Diagnosis not present

## 2022-01-05 DIAGNOSIS — E7800 Pure hypercholesterolemia, unspecified: Secondary | ICD-10-CM

## 2022-01-05 DIAGNOSIS — Z23 Encounter for immunization: Secondary | ICD-10-CM | POA: Diagnosis not present

## 2022-01-05 DIAGNOSIS — R600 Localized edema: Secondary | ICD-10-CM | POA: Diagnosis not present

## 2022-01-05 LAB — LIPID PANEL
Cholesterol: 144 mg/dL (ref 0–200)
HDL: 60.9 mg/dL (ref >39.00)
LDL Cholesterol: 61 mg/dL (ref 0–99)
NonHDL: 83.41
Total CHOL/HDL Ratio: 2
Triglycerides: 113 mg/dL (ref 0.0–149.0)
VLDL: 22.6 mg/dL (ref 0.0–40.0)

## 2022-01-05 LAB — BASIC METABOLIC PANEL
BUN: 23 mg/dL (ref 6–23)
CO2: 28 mEq/L (ref 19–32)
Calcium: 9.7 mg/dL (ref 8.4–10.5)
Chloride: 106 mEq/L (ref 96–112)
Creatinine, Ser: 1.11 mg/dL (ref 0.40–1.20)
GFR: 45.51 mL/min — ABNORMAL LOW (ref >60.00)
Glucose, Bld: 99 mg/dL (ref 70–99)
Potassium: 4.6 mEq/L (ref 3.5–5.1)
Sodium: 143 mEq/L (ref 135–145)

## 2022-01-05 MED ORDER — ALBUTEROL SULFATE HFA 108 (90 BASE) MCG/ACT IN AERS: INHALATION_SPRAY | RESPIRATORY_TRACT | 0 refills | Status: DC

## 2022-01-05 MED ORDER — ZOSTER VAC RECOMB ADJUVANTED 50 MCG/0.5ML IM SUSR: 0.5000 mL | Freq: Once | INTRAMUSCULAR | 0 refills | Status: AC

## 2022-01-05 NOTE — Telephone Encounter (Signed)
Pt has appt today. Will refill then if needed

## 2022-01-05 NOTE — Progress Notes (Signed)
OFFICE VISIT  01/05/2022  CC:  Chief Complaint  Patient presents with   Medical Management of Chronic Issues    Pt is fasting? (Ate at 3am this morning)     Patient is a 86 y.o. female who presents for 43-monthfollow-up hypertension, hyperlipidemia, chronic renal insufficiency stage III, and anxiet (high risk med use). A/P as of last visit: "#1 hypertension, well controlled. Reassured patient that elevated BP today likely due to stress of this office visit plus not taking meds yet today. Home BPs reassuring. Continue Lopressor 100 twice daily, hydralazine 50 3 times daily, and amlodipine 10 mg a day. Electrolytes and creatinine today.  2.  Hyperlipidemia, doing well on atorvastatin 40 mg a day. LDL was 53 about 8 months ago. Lipid panel and hepatic panel today.  3.  Chronic renal insufficiency stage III. She avoids NSAIDs and hydrates well. Electrolytes and creatinine today.  4.  Generalized anxiety.  Long-term stability on Xanax 1 mg 3 times daily as needed. Controlled substance contract updated and urine drug screen obtained today.   #5 deconditioning.  I think this is responsible for her feeling of breathlessness with activity. Somewhere along the line I think she was prescribed albuterol for an acute bronchitic illness that she has continued to use this some.  I am not convinced that her breathlessness is due to bronchospasm/asthma. I told her to give her breathlessness 5 to 10 minutes to resolve before trying the albuterol. I have low suspicion that this is anything to do with heart failure, ischemic disease, or dysrhythmia."  INTERIM HX: Is feeling well. She is starting to walk for exercise on most days. She is eager to start using the workout facilities at her home. She is happy.  Of note, pt did not take her medication yet today.  Her blood pressure at home is consistently around 130/80. She last ate about 7 hours ago.  PMP AWARE reviewed today: most recent rx for  alprazolam was filled 09/17/2021, # [id number] rx by me. No red flags.  ROS as above, plus--> no fevers, no CP, no SOB, no wheezing, no cough, no dizziness, no HAs, no rashes, no melena/hematochezia.  No polyuria or polydipsia.  No myalgias or arthralgias.  No focal weakness, paresthesias, or tremors.  No acute vision or hearing abnormalities.  No dysuria or unusual/new urinary urgency or frequency.  No recent changes in lower legs. No n/v/d or abd pain.  No palpitations.    Past Medical History:  Diagnosis Date   Abnormal EKG approx 2008   Nuclear stress test neg;    Acute pancreatitis 09/2020   idiopathic.  +panc psudocyst   Anxiety    with panic   CAP (community acquired pneumonia) 08/2017   Hospitalization for CAP/acute diast HF/rapid a-fib   Cataract    s/p surgery--lens implants   Chronic diastolic heart failure ([place] 2019   Chronic renal insufficiency, stage 3 (moderate) ([place] 12/04/2012   Renal u/s when in hosp 08/2017 for CAP/CHF showed symmetric kidneys, echogenicity normal, w/out hydronephrosis. Baseline GFR around 40 ml/min as of 10/2018.   DDD (degenerative disc disease), lumbar    Diverticulosis 2009   Fracture of radial shaft, left, closed 11/16/2010   fell down flight of stairs   History of kidney stones    Hx of adenomatous colonic polyps 2002;2009;2015   surveillance colonoscopy 2009, +polypectomy done-tubular adenoma w/out high grade.  05/2013 tubular adenomas--recall 3 yrs   Hyperlipidemia    Hypertension    Low TSH level  02/18/2016   T3 norm, T4 mildly elevated--suspected sick euthyroid syndrome.  Repeat labs 06/2016: normal.   Melanoma in situ ([place]) 06/2018   L LL   Osteoarthritis of both knees    viscosupplementation injections helpful 2020/21   Osteopenia    DEXA 08/2010; repeat DEXA 02/2015 worse: fosamax started.  06/2018 Dexa T score -2.4.  2020 maj osteop fx risk = 24%, Hip fx risk 7.3%. 09/2020 T score -2.1   PAF (paroxysmal atrial fibrillation) ([place]) 02/2016    when in post-op for ankle surgery; spontaneously converted in hosp, seen by Dr. Debara Pickett in consultation--metoprolol rate control + xarelto recommended.  Metop d/c due to hypot.  Plan to cont xarelto 20 mg qd indef due to CHAD-VASc score of 3.  A-fib w/RVR and CHF 07/2017; pt placed on amiodarone and plan for CV, but pt was in sinus rhythm when she went in for her DC CV, so she was sent home.   Peripheral edema    Pneumonia 2015   hx with sepsis   Rheumatic fever     Past Surgical History:  Procedure Laterality Date   APPENDECTOMY  1966   done during surgery for tubal pregnancy   CATARACT EXTRACTION W/ INTRAOCULAR LENS IMPLANT  2013   bilat   COLONOSCOPY W/ POLYPECTOMY  05/2013   +diverticulosis; recall 3 yrs (Dr. Henrene Pastor)   DEXA  02/2015; 06/2018   T score -2.1 in both femoral necks; FRAX 10 yr risk of major osteoporotic fracture was 21%---fosamax started. 06/2018 T score -2.4.  T score 09/2020 -2.1. Rpt 2 yrs.   ECTOPIC PREGNANCY SURGERY     EYE SURGERY     LEFT HEART CATH AND CORONARY ANGIOGRAPHY N/A 09/01/2017   No angiographically significant CAD.  Upper normal left ventricular filling pressure.  Procedure: LEFT HEART CATH AND CORONARY ANGIOGRAPHY;  Surgeon: Nelva Bush, MD;  Location: [facility];  Service: Cardiovascular;  Laterality: N/A;   LUMBAR LAMINECTOMY/DECOMPRESSION MICRODISCECTOMY N/A 09/26/2018   Procedure: Decompressive Lumbar Laminectomy L5 S1 FORAMINOTOMY L5 S1  NERVE ROOT BILATERALLY and Microdiscectomy L5-S1 Left;  Surgeon: Latanya Maudlin, MD;  Location: [facility];  Service: Orthopedics;  Laterality: N/A;  158mn   OPEN REDUCTION INTERNAL FIXATION (ORIF) TIBIA/FIBULA FRACTURE Left 02/18/2016   Procedure: OPEN REDUCTION INTERNAL FIXATION (ORIF) Right ankle trimalleolar fracture;  Surgeon: JWylene Simmer MD;  [place] [facility]  Service: Orthopedics;  Laterality: Left;  requests 915ms   ORIF RADIAL FRACTURE  11/18/2010   left; s/p slip on slippery floor and fell    THORACENTESIS  08/2017   diagnostic and therapeutic.  Transudative.  Clx neg.  (+pulm edema/diastolic HF)   TONSILLECTOMY     TRANSTHORACIC ECHOCARDIOGRAM  02/18/2016; 08/10/17   LVEF of 55-60%, mild AI and mild MR and normal biatrial size.  07/2017--normal LV function, mild enlarge aortic root, mild/mod TR, bilat atrial enlargement.    Outpatient Medications Prior to Visit  Medication Sig Dispense Refill   acetaminophen (TYLENOL) 650 MG CR tablet Take 650 mg by mouth in the morning and at bedtime.     albuterol (VENTOLIN HFA) 108 (90 Base) MCG/ACT inhaler INHALE 2 PUFFS BY MOUTH into THE lungs EVERY 4 HOURS AS NEEDED FOR WHEEZING OR SHORTNESS OF BREATH 18 g 0   ALPRAZolam (XANAX) 0.5 MG tablet TAKE 1 TABLET BY MOUTH 3 TIMES DAILY AS NEEDED FOR ANXIETY 90 tablet 5   amLODipine (NORVASC) 10 MG tablet TAKE 1 TABLET BY MOUTH EVERY DAY 90 tablet 1   Apoaequorin (PREVAGEN  PO) Take 1 tablet by mouth daily.     Ascorbic Acid (VITAMIN C PO) Take by mouth daily.     atorvastatin (LIPITOR) 40 MG tablet TAKE 1 TABLET BY MOUTH EVERY DAY 90 tablet 1   Biotin 5000 MCG TABS Take 5,000 mcg by mouth daily.     Calcium Carbonate (CALCIUM 600 PO) Take 1,200 mg by mouth daily.      carboxymethylcellulose (REFRESH PLUS) 0.5 % SOLN Place 2 drops into both eyes daily as needed (dry/irritated eyes.).     Coenzyme Q10 200 MG capsule Take 200 mg by mouth daily.     fluticasone (FLONASE) 50 MCG/ACT nasal spray Place 2 sprays into both nostrils daily.      furosemide (LASIX) 40 MG tablet TAKE 1 TABLET BY MOUTH EVERY DAY 90 tablet 1   hydrALAZINE (APRESOLINE) 50 MG tablet TAKE 1 TABLET BY MOUTH 3 TIMES DAILY 270 tablet 1   metoprolol tartrate (LOPRESSOR) 100 MG tablet TAKE 1 TABLET BY MOUTH 2 TIMES DAILY 180 tablet 2   Multiple Vitamins-Minerals (CENTRUM SILVER ULTRA WOMENS) TABS Take 1 tablet by mouth every evening.     potassium chloride SA (KLOR-CON M) 20 MEQ tablet TAKE 1 TABLET BY MOUTH 2 TIMES DAILY 180 tablet 3    Rivaroxaban (XARELTO) 15 MG TABS tablet TAKE 1 TABLET BY MOUTH EVERY DAY AT NOON 90 tablet 1   No facility-administered medications prior to visit.    Allergies  Allergen Reactions   Augmentin [Amoxicillin-Pot Clavulanate] Nausea And Vomiting and Other (See Comments)    "projectile vomiting" Has patient had a PCN reaction causing immediate rash, facial/tongue/throat swelling, SOB or lightheadedness with hypotension:No Has patient had a PCN reaction causing severe rash involving mucus membranes or skin necrosis:No Has patient had a PCN reaction that required hospitalization:No Has patient had a PCN reaction occurring within the last 10 years:Yes If all of the above answers are "NO", then may proceed with Cephalosporin use.    Amoxicillin Rash   Clindamycin/Lincomycin Rash    ROS As per HPI  PE:    01/05/2022    9:33 AM 10/27/2021    1:34 PM 09/06/2021    8:16 AM  Vitals with BMI  Height 5' 1.5"  5' 1.5"  Weight 179 lbs 3 oz 172 lbs 172 lbs 13 oz  BMI 68.03  21.22  Systolic 482  500  Diastolic 76  68  Pulse 68  62    Physical Exam  Gen: Alert, well appearing.  Patient is oriented to person, place, time, and situation. AFFECT: pleasant, lucid thought and speech. CV: RRR, no m/r/g.   LUNGS: CTA bilat, nonlabored resps, good aeration in all lung fields. EXT: no clubbing or cyanosis.  1-2+ bilat LE pitting edema.    LABS:  Last CBC Lab Results  Component Value Date   WBC 8.2 09/24/2020   HGB 11.7 09/24/2020   HCT 36.4 09/24/2020   MCV 90 09/24/2020   MCH 29.0 09/24/2020   RDW 13.8 09/24/2020   PLT 460 (H) 37/04/8887   Last metabolic panel Lab Results  Component Value Date   GLUCOSE 91 05/06/2021   NA 142 05/06/2021   K 4.4 05/06/2021   CL 105 05/06/2021   CO2 28 05/06/2021   BUN 23 05/06/2021   CREATININE 1.02 05/06/2021   EGFR 67 09/24/2020   CALCIUM 9.4 05/06/2021   PHOS 2.3 (L) 09/18/2020   PROT 6.7 05/06/2021   ALBUMIN 4.2 05/06/2021   LABGLOB 2.4  09/24/2020  AGRATIO 1.5 09/24/2020   BILITOT 0.9 05/06/2021   ALKPHOS 78 05/06/2021   AST 21 05/06/2021   ALT 24 05/06/2021   ANIONGAP 6 09/18/2020   Last lipids Lab Results  Component Value Date   CHOL 159 05/06/2021   HDL 72.20 05/06/2021   LDLCALC 72 05/06/2021   LDLDIRECT 66.0 09/10/2018   TRIG 75.0 05/06/2021   CHOLHDL 2 05/06/2021   Last thyroid functions Lab Results  Component Value Date   TSH 0.68 10/22/2018   T3TOTAL 115.0 07/04/2016   Last vitamin D Lab Results  Component Value Date   VD25OH 37 05/25/2010    IMPRESSION AND PLAN:  1 hypertension, well controlled. Home BPs good. Continue Lopressor 100 twice daily, hydralazine 50 3 times daily, and amlodipine 10 mg a day. Electrolytes and creatinine today.  2.  Hyperlipidemia, doing well on atorvastatin 40 mg a day. LDL was 72 about 8 months ago. Lipid panel today.  3.  Chronic renal insufficiency stage III. She avoids NSAIDs and hydrates well. Electrolytes and creatinine today.  4.  Generalized anxiety.  Long-term stability on Xanax 1 mg 3 times daily as needed. Controlled substance contract and urine drug screen up-to-date.  #5 chronic bilateral lower extremity edema. Stable on Lasix 40 mg a day and wearing compression stockings regularly.    #6  Preventative health care: Vaccines: All up-to-date except Shingrix--> prescription sent to pharmacy today. Breast cancer screening: Mammogram has been ordered. Osteoporosis screening: Due for next DEXA September 2024.  An After Visit Summary was printed and given to the patient.  FOLLOW UP: Return in about 3 months (around 04/06/2022) for routine chronic illness f/u.  Signed:  Crissie Sickles, MD           01/05/2022

## 2022-01-07 ENCOUNTER — Other Ambulatory Visit: Payer: Self-pay | Admitting: Internal Medicine

## 2022-01-07 ENCOUNTER — Other Ambulatory Visit: Payer: Self-pay | Admitting: Family Medicine

## 2022-01-07 DIAGNOSIS — I480 Paroxysmal atrial fibrillation: Secondary | ICD-10-CM

## 2022-01-12 ENCOUNTER — Telehealth: Payer: Self-pay

## 2022-01-12 NOTE — Telephone Encounter (Signed)
Patient returning call about lab results.  Please call patient back when available.  Patient has some questions.

## 2022-01-12 NOTE — Telephone Encounter (Signed)
Pt advised of results. Requested paper copy of results

## 2022-01-18 DIAGNOSIS — M170 Bilateral primary osteoarthritis of knee: Secondary | ICD-10-CM | POA: Diagnosis not present

## 2022-01-19 ENCOUNTER — Ambulatory Visit: Payer: Medicare PPO | Admitting: Internal Medicine

## 2022-01-19 ENCOUNTER — Telehealth: Payer: Self-pay | Admitting: Internal Medicine

## 2022-01-19 NOTE — Telephone Encounter (Signed)
Good Morning Dr. Henrene Pastor,   Patient called stating that she would not be able to make it to her appointment with you today at 9:20 due to her car not starting.   Patient was rescheduled for 2/27 at 2:40.

## 2022-02-25 DIAGNOSIS — M170 Bilateral primary osteoarthritis of knee: Secondary | ICD-10-CM | POA: Diagnosis not present

## 2022-02-25 DIAGNOSIS — M545 Low back pain, unspecified: Secondary | ICD-10-CM | POA: Diagnosis not present

## 2022-02-25 DIAGNOSIS — G8929 Other chronic pain: Secondary | ICD-10-CM | POA: Diagnosis not present

## 2022-02-25 DIAGNOSIS — R293 Abnormal posture: Secondary | ICD-10-CM | POA: Diagnosis not present

## 2022-02-25 DIAGNOSIS — R2681 Unsteadiness on feet: Secondary | ICD-10-CM | POA: Diagnosis not present

## 2022-03-10 DIAGNOSIS — R262 Difficulty in walking, not elsewhere classified: Secondary | ICD-10-CM | POA: Diagnosis not present

## 2022-03-10 DIAGNOSIS — R278 Other lack of coordination: Secondary | ICD-10-CM | POA: Diagnosis not present

## 2022-03-14 DIAGNOSIS — R278 Other lack of coordination: Secondary | ICD-10-CM | POA: Diagnosis not present

## 2022-03-14 DIAGNOSIS — R262 Difficulty in walking, not elsewhere classified: Secondary | ICD-10-CM | POA: Diagnosis not present

## 2022-03-15 ENCOUNTER — Ambulatory Visit: Payer: Medicare PPO | Admitting: Internal Medicine

## 2022-03-15 ENCOUNTER — Other Ambulatory Visit (INDEPENDENT_AMBULATORY_CARE_PROVIDER_SITE_OTHER): Payer: Medicare PPO

## 2022-03-15 ENCOUNTER — Encounter: Payer: Self-pay | Admitting: Internal Medicine

## 2022-03-15 VITALS — BP 124/76 | HR 76 | Ht 61.5 in | Wt 184.0 lb

## 2022-03-15 DIAGNOSIS — K862 Cyst of pancreas: Secondary | ICD-10-CM

## 2022-03-15 DIAGNOSIS — K8500 Idiopathic acute pancreatitis without necrosis or infection: Secondary | ICD-10-CM | POA: Diagnosis not present

## 2022-03-15 DIAGNOSIS — R935 Abnormal findings on diagnostic imaging of other abdominal regions, including retroperitoneum: Secondary | ICD-10-CM

## 2022-03-15 DIAGNOSIS — R600 Localized edema: Secondary | ICD-10-CM | POA: Diagnosis not present

## 2022-03-15 LAB — BASIC METABOLIC PANEL
BUN: 27 mg/dL — ABNORMAL HIGH (ref 6–23)
CO2: 27 mEq/L (ref 19–32)
Calcium: 10 mg/dL (ref 8.4–10.5)
Chloride: 105 mEq/L (ref 96–112)
Creatinine, Ser: 1.15 mg/dL (ref 0.40–1.20)
GFR: 43.56 mL/min — ABNORMAL LOW (ref >60.00)
Glucose, Bld: 109 mg/dL — ABNORMAL HIGH (ref 70–99)
Potassium: 4 mEq/L (ref 3.5–5.1)
Sodium: 140 mEq/L (ref 135–145)

## 2022-03-15 NOTE — Patient Instructions (Signed)
_______________________________________________________  If your blood pressure at your visit was 140/90 or greater, please contact your primary care physician to follow up on this.  _______________________________________________________  If you are age 85 or older, your body mass index should be between 23-30. Your Body mass index is 34.2 kg/m. If this is out of the aforementioned range listed, please consider follow up with your Primary Care Provider.  If you are age 84 or younger, your body mass index should be between 19-25. Your Body mass index is 34.2 kg/m. If this is out of the aformentioned range listed, please consider follow up with your Primary Care Provider.   ________________________________________________________  The [facility] [facility] providers would like to encourage you to use [facility] to communicate with providers for non-urgent requests or questions.  Due to long hold times on the telephone, sending your provider a message by [facility] may be a faster and more efficient way to get a response.  Please allow 48 business hours for a response.  Please remember that this is for non-urgent requests.  _______________________________________________________  Brenda Matthews have been scheduled for a CT scan of the abdomen and pelvis at [facility], 1st floor Radiology. You are scheduled on 03/30/2022 at 1:30pm. You should arrive 15 minutes prior to your appointment time for registration.   Please follow the written instructions below on the day of your exam:   1) Do not eat anything after 9:30am (4 hours prior to your test)   You may take any medications as prescribed with a small amount of water, if necessary. If you take any of the following medications: METFORMIN, GLUCOPHAGE, GLUCOVANCE, AVANDAMET, RIOMET, FORTAMET, [place] MET, JANUMET, GLUMETZA or METAGLIP, you MAY be asked to HOLD this medication 48 hours AFTER the exam.   The purpose of you drinking the oral contrast is to aid in the  visualization of your intestinal tract. The contrast solution may cause some diarrhea. Depending on your individual set of symptoms, you may also receive an intravenous injection of x-ray contrast/dye. Plan on being at [facility] for 45 minutes or longer, depending on the type of exam you are having performed.   Please follow up in one year  If you have any questions regarding your exam or if you need to reschedule, you may call [facility] at [phone] between the hours of 8:00 am and 5:00 pm, Monday-Friday.

## 2022-03-15 NOTE — Progress Notes (Signed)
HISTORY OF PRESENT ILLNESS:  Brenda Matthews is a 87 y.o. female with multiple significant medical problems as listed below who presents today for follow-up regarding prior history of idiopathic pancreatitis and abnormal pancreatic imaging with a pancreatic cystic lesion.  She was last seen in this office October 28, 2020 for posthospital follow-up after her first and only bout of acute pancreatitis in late August 2022.  See that dictation for details.  Despite extensive workup, no obvious etiology for her pancreatitis was ascertained.  She was noted to have a cystic lesion of the tail of the pancreas, the clinical significance of which was uncertain.  It was elected to follow this radiographically.  Her last advanced imaging was a CT scan of the abdomen and pelvis with and without contrast May 20, 2021.  The cystic lesion of the tail of the pancreas was noted to be about 50% reduced in volume compared to previous exam with largest dimensions 2.3 cm.  Patient did have multiple appointments with this office that were either canceled or no showed.  She tells me that she has been feeling well.  No recurrence of pain.  Good appetite.  She has gained about 15 pounds since her last office visit.  GI review of systems is negative.  She wonders what she might do to prevent future episodes of pancreatitis.  Review of blood work from December 2023 shows unremarkable basic metabolic panel.  She does have a history of adenomatous colon polyps with multiple prior colonoscopies, most recently 2015.  Aged out of surveillance.  Her only significant non-GI complaint is that of worsening of her chronic bilateral lower extremity edema.  She does take a diuretic.  She does use compression stockings.  I defer this issue to her PCP.  REVIEW OF SYSTEMS:  All non-GI ROS negative unless otherwise stated in the HPI except for arthritis  Past Medical History:  Diagnosis Date   Abnormal EKG approx 2008   Nuclear stress test neg;     Acute pancreatitis 09/2020   idiopathic.  +panc psudocyst   Anxiety    with panic   CAP (community acquired pneumonia) 08/2017   Hospitalization for CAP/acute diast HF/rapid a-fib   Cataract    s/p surgery--lens implants   Chronic diastolic heart failure ([place]) 2019   Chronic renal insufficiency, stage 3 (moderate) ([place]) 12/04/2012   Renal u/s when in hosp 08/2017 for CAP/CHF showed symmetric kidneys, echogenicity normal, w/out hydronephrosis. Baseline GFR around 40 ml/min as of 10/2018.   DDD (degenerative disc disease), lumbar    Diverticulosis 2009   Fracture of radial shaft, left, closed 11/16/2010   fell down flight of stairs   History of kidney stones    Hx of adenomatous colonic polyps 2002;2009;2015   surveillance colonoscopy 2009, +polypectomy done-tubular adenoma w/out high grade.  05/2013 tubular adenomas--recall 3 yrs   Hyperlipidemia    Hypertension    Low TSH level 02/18/2016   T3 norm, T4 mildly elevated--suspected sick euthyroid syndrome.  Repeat labs 06/2016: normal.   Melanoma in situ ([place]) 06/2018   L LL   Osteoarthritis of both knees    viscosupplementation injections helpful 2020/21   Osteopenia    DEXA 08/2010; repeat DEXA 02/2015 worse: fosamax started.  06/2018 Dexa T score -2.4.  2020 maj osteop fx risk = 24%, Hip fx risk 7.3%. 09/2020 T score -2.1   PAF (paroxysmal atrial fibrillation) ([place]) 02/2016   when in post-op for ankle surgery; spontaneously converted in hosp, seen by Dr.  Hilty in consultation--metoprolol rate control + xarelto recommended.  Metop d/c due to hypot.  Plan to cont xarelto 20 mg qd indef due to CHAD-VASc score of 3.  A-fib w/RVR and CHF 07/2017; pt placed on amiodarone and plan for CV, but pt was in sinus rhythm when she went in for her DC CV, so she was sent home.   Peripheral edema    Pneumonia 2015   hx with sepsis   Rheumatic fever     Past Surgical History:  Procedure Laterality Date   APPENDECTOMY  1966   done during surgery for  tubal pregnancy   CATARACT EXTRACTION W/ INTRAOCULAR LENS IMPLANT  2013   bilat   COLONOSCOPY W/ POLYPECTOMY  05/2013   +diverticulosis; recall 3 yrs (Dr. Henrene Pastor)   DEXA  02/2015; 06/2018   T score -2.1 in both femoral necks; FRAX 10 yr risk of major osteoporotic fracture was 21%---fosamax started. 06/2018 T score -2.4.  T score 09/2020 -2.1. Rpt 2 yrs.   ECTOPIC PREGNANCY SURGERY     EYE SURGERY     LEFT HEART CATH AND CORONARY ANGIOGRAPHY N/A 09/01/2017   No angiographically significant CAD.  Upper normal left ventricular filling pressure.  Procedure: LEFT HEART CATH AND CORONARY ANGIOGRAPHY;  Surgeon: Nelva Bush, MD;  Location: [facility];  Service: Cardiovascular;  Laterality: N/A;   LUMBAR LAMINECTOMY/DECOMPRESSION MICRODISCECTOMY N/A 09/26/2018   Procedure: Decompressive Lumbar Laminectomy L5 S1 FORAMINOTOMY L5 S1  NERVE ROOT BILATERALLY and Microdiscectomy L5-S1 Left;  Surgeon: Latanya Maudlin, MD;  Location: [facility];  Service: Orthopedics;  Laterality: N/A;  147mn   OPEN REDUCTION INTERNAL FIXATION (ORIF) TIBIA/FIBULA FRACTURE Left 02/18/2016   Procedure: OPEN REDUCTION INTERNAL FIXATION (ORIF) Right ankle trimalleolar fracture;  Surgeon: JWylene Simmer MD;  Location: [facility]  Service: Orthopedics;  Laterality: Left;  requests [id number]   ORIF RADIAL FRACTURE  11/18/2010   left; s/p slip on slippery floor and fell   THORACENTESIS  08/2017   diagnostic and therapeutic.  Transudative.  Clx neg.  (+pulm edema/diastolic HF)   TONSILLECTOMY     TRANSTHORACIC ECHOCARDIOGRAM  02/18/2016; 08/10/17   LVEF of 55-60%, mild AI and mild MR and normal biatrial size.  07/2017--normal LV function, mild enlarge aortic root, mild/mod TR, bilat atrial enlargement.    Social History Brenda DIXreports that she has never smoked. She has never used smokeless tobacco. She reports current alcohol use. She reports that she does not use drugs.  family history includes Diabetes in her sister; Heart  disease in her father and mother; Hypertension in her brother.  Allergies  Allergen Reactions   Augmentin [Amoxicillin-Pot Clavulanate] Nausea And Vomiting and Other (See Comments)    "projectile vomiting" Has patient had a PCN reaction causing immediate rash, facial/tongue/throat swelling, SOB or lightheadedness with hypotension:No Has patient had a PCN reaction causing severe rash involving mucus membranes or skin necrosis:No Has patient had a PCN reaction that required hospitalization:No Has patient had a PCN reaction occurring within the last 10 years:Yes If all of the above answers are "NO", then may proceed with Cephalosporin use.    Amoxicillin Rash   Clindamycin/Lincomycin Rash       PHYSICAL EXAMINATION: Vital signs: BP 124/76   Pulse 76   Ht 5' 1.5" (1.562 m)   Wt 184 lb (83.5 kg)   LMP  (LMP Unknown)   BMI 34.20 kg/m   Constitutional: Pleasant, generally well-appearing, no acute distress Psychiatric: alert and oriented x3, cooperative Eyes: extraocular  movements intact, anicteric, conjunctiva pink Mouth: oral pharynx moist, no lesions Neck: supple no lymphadenopathy Cardiovascular: heart regular rate and rhythm, no murmur Lungs: clear to auscultation bilaterally Abdomen: soft, obese, nontender, nondistended, no obvious ascites, no peritoneal signs, normal bowel sounds, no organomegaly Rectal: Omitted Extremities: no clubbing, cyanosis, or lower extremity edema bilaterally Skin: no lesions on visible extremities Neuro: No focal deficits.  Cranial nerves intact  ASSESSMENT:  1.  Prior history of acute pancreatitis, idiopathic.  No recurrence. 2.  Cystic lesion of the tail of the pancreas.  Being followed.  Last imaging May 2023 3.  Multiple medical problems 4.  History of adenomatous polyps.  Aged out of surveillance   PLAN:  1.  Discussion on pancreatitis.  Multiple questions answered. 2.  Contrast CT of the abdomen to follow-up on the pancreatic cystic  lesion.  If no significant change, no further imaging would be recommended. 3.  Routine office follow-up 1 year.  Sooner if needed 4.  Ongoing general medical care with PCP and other specialist Total time 30 minutes spent preparing to see the patient, obtaining interval history, performing medically appropriate physical examination, counseling and educating the patient regarding the above listed issues, ordering advanced radiology study, defining follow-up interval, and documenting clinical information in the health record

## 2022-03-16 DIAGNOSIS — R278 Other lack of coordination: Secondary | ICD-10-CM | POA: Diagnosis not present

## 2022-03-16 DIAGNOSIS — R262 Difficulty in walking, not elsewhere classified: Secondary | ICD-10-CM | POA: Diagnosis not present

## 2022-03-18 DIAGNOSIS — R262 Difficulty in walking, not elsewhere classified: Secondary | ICD-10-CM | POA: Diagnosis not present

## 2022-03-18 DIAGNOSIS — R278 Other lack of coordination: Secondary | ICD-10-CM | POA: Diagnosis not present

## 2022-03-21 DIAGNOSIS — R278 Other lack of coordination: Secondary | ICD-10-CM | POA: Diagnosis not present

## 2022-03-21 DIAGNOSIS — R262 Difficulty in walking, not elsewhere classified: Secondary | ICD-10-CM | POA: Diagnosis not present

## 2022-03-25 DIAGNOSIS — R278 Other lack of coordination: Secondary | ICD-10-CM | POA: Diagnosis not present

## 2022-03-25 DIAGNOSIS — R262 Difficulty in walking, not elsewhere classified: Secondary | ICD-10-CM | POA: Diagnosis not present

## 2022-03-29 ENCOUNTER — Other Ambulatory Visit: Payer: Self-pay | Admitting: Family Medicine

## 2022-03-29 NOTE — Telephone Encounter (Signed)
Patient refill request.  She called to say she dropped her inhaler in the toilet and current prescription does not have any refills.  Patient explained she needed in haler today, as she does not have another inhaler.  I told patient I would send message to clinical team, and try to get this approved for her today.  Please call patient at [phone]

## 2022-03-30 ENCOUNTER — Ambulatory Visit (HOSPITAL_COMMUNITY): Payer: Medicare PPO

## 2022-03-30 DIAGNOSIS — R262 Difficulty in walking, not elsewhere classified: Secondary | ICD-10-CM | POA: Diagnosis not present

## 2022-03-30 DIAGNOSIS — R278 Other lack of coordination: Secondary | ICD-10-CM | POA: Diagnosis not present

## 2022-04-01 DIAGNOSIS — R278 Other lack of coordination: Secondary | ICD-10-CM | POA: Diagnosis not present

## 2022-04-01 DIAGNOSIS — R262 Difficulty in walking, not elsewhere classified: Secondary | ICD-10-CM | POA: Diagnosis not present

## 2022-04-07 ENCOUNTER — Ambulatory Visit: Payer: Medicare PPO | Attending: General Practice | Admitting: Cardiology

## 2022-04-07 ENCOUNTER — Encounter: Payer: Self-pay | Admitting: Cardiology

## 2022-04-07 VITALS — BP 132/64 | HR 66 | Ht 61.5 in | Wt 179.8 lb

## 2022-04-07 DIAGNOSIS — N1832 Chronic kidney disease, stage 3b: Secondary | ICD-10-CM | POA: Diagnosis not present

## 2022-04-07 DIAGNOSIS — I480 Paroxysmal atrial fibrillation: Secondary | ICD-10-CM

## 2022-04-07 DIAGNOSIS — R600 Localized edema: Secondary | ICD-10-CM

## 2022-04-07 DIAGNOSIS — I5032 Chronic diastolic (congestive) heart failure: Secondary | ICD-10-CM | POA: Diagnosis not present

## 2022-04-07 DIAGNOSIS — I10 Essential (primary) hypertension: Secondary | ICD-10-CM | POA: Diagnosis not present

## 2022-04-07 DIAGNOSIS — R0609 Other forms of dyspnea: Secondary | ICD-10-CM

## 2022-04-07 MED ORDER — APIXABAN 5 MG PO TABS: 5.0000 mg | ORAL_TABLET | Freq: Two times a day (BID) | ORAL | 3 refills | Status: DC

## 2022-04-07 NOTE — Patient Instructions (Addendum)
Medication Instructions:  STOP XARELTO WHEN FINISHED AND START ELIQUIS 5MG  TWICE DAILY  TAKE EXTRA FUROSEMIDE FOR THE NEXT 2 DAYS *If you need a refill on your cardiac medications before your next appointment, please call your pharmacy*  Lab Work: BNP AND BMET CBC TODAY AND BMET ON MONDAY If you have labs (blood work) drawn today and your tests are completely normal, you will receive your results only by: [place] (if you have MyChart) OR  A paper copy in the mail If you have any lab test that is abnormal or we need to change your treatment, we will call you to review the results.  Testing/Procedures: Echocardiogram - Your physician has requested that you have an echocardiogram. Echocardiography is a painless test that uses sound waves to create images of your heart. It provides your doctor with information about the size and shape of your heart and how well your heart's chambers and valves are working. This procedure takes approximately one hour. There are no restrictions for this procedure.    Follow-Up: At [facility], you and your health needs are our priority.  As part of our continuing mission to provide you with exceptional heart care, we have created designated Provider Care Teams.  These Care Teams include your primary Cardiologist (physician) and Advanced Practice Providers (APPs -  Physician Assistants and Nurse Practitioners) who all work together to provide you with the care you need, when you need it.  We recommend signing up for the patient portal called "MyChart".  Sign up information is provided on this After Visit Summary.  MyChart is used to connect with patients for Virtual Visits (Telemedicine).  Patients are able to view lab/test results, encounter notes, upcoming appointments, etc.  Non-urgent messages can be sent to your provider as well.   To learn more about what you can do with MyChart, go to [email or web address].    Your next appointment:   2  week(s)  Provider:   [name], MD  or Coletta Memos, FNP        Other Instructions

## 2022-04-07 NOTE — Progress Notes (Signed)
Cardiology Office Note:    Date:  04/07/2022   ID:  SHANNE KAVANAGH, DOB [birth year], MRN [id number]  PCP:  [name], MD   [place] [facility] Cardiologist:  [name], MD     Referring MD: [name], MD   CC: SOB   History of Present Illness:    Brenda Matthews is a 87 y.o. female with a hx of hypertension, atrial fibrillation, chronic diastolic heart failure, idiopathic acute pancreatitis, HLD, anxiety, CKD.  Initially seen by Dr. Debara Pickett in February 2018 for new onset of atrial fibrillation with RVR during anesthesia for an ankle fracture.  Echo at that time showed a normal LV function.  She was initially started on a low-dose beta-blocker and Xarelto, she eventually converted to sinus rhythm and her beta-blocker was held due to low blood pressure.  She had a CHA2DS2-VASc of 3 and Xarelto was continued.  She underwent a left heart cath in August 2019 which revealed normal coronary arteries.  In 2019 she had a recurrent episode of atrial fibrillation with RVR.  Echo at that time showed an EF of 50 to 55%, mild MR, mild to moderate TR.  She was started on amiodarone, with plans for DCCV.  Upon presentation for DCCV, she was found to be in sinus rhythm.  In 2021 she was evaluated by Dr. Debara Pickett and was doing well from a cardiac perspective, her amiodarone was discontinued at this visit.  Most recently evaluated by Coletta Memos NP on 09/06/2021, overall she was doing well from a cardiac perspective although she did notice occasional episodes of dizziness that did not seem to be attributed to anything.  She also had some mild DOE with walking long distances.  She was going to the gym 3 times per week at [facility].  She presents today for follow-up of shortness of breath that has been persistent over the last week.  She states on Sunday as she was walking to her normal exercise therapy at [facility] she felt very short of breath and had to stop in the hallway,  which is unusual for her.  She noted that her legs were more swollen than normal and twice this week she had taken an extra half tablet of her Lasix, which helped on the days however the edema returned.  She does not weigh herself daily, she feels she is probably drinking around 64 ounces of total fluid per day, she does try to avoid salty her options at [facility] and does not add salt to any of her foods.  Today, her weight is up 7 pounds from her last visit here in August.  She denies chest pain, palpitations, dyspnea, pnd, orthopnea, n, v, dizziness, syncope, or early satiety.   Past Medical History:  Diagnosis Date   Abnormal EKG approx 2008   Nuclear stress test neg;    Acute pancreatitis 09/2020   idiopathic.  +panc psudocyst   Anxiety    with panic   CAP (community acquired pneumonia) 08/2017   Hospitalization for CAP/acute diast HF/rapid a-fib   Cataract    s/p surgery--lens implants   Chronic diastolic heart failure ([place]) 2019   Chronic renal insufficiency, stage 3 (moderate) ([place]) 12/04/2012   Renal u/s when in hosp 08/2017 for CAP/CHF showed symmetric kidneys, echogenicity normal, w/out hydronephrosis. Baseline GFR around 40 ml/min as of 10/2018.   DDD (degenerative disc disease), lumbar    Diverticulosis 2009   Fracture of radial shaft, left, closed  11/16/2010   fell down flight of stairs   History of kidney stones    Hx of adenomatous colonic polyps 2002;2009;2015   surveillance colonoscopy 2009, +polypectomy done-tubular adenoma w/out high grade.  05/2013 tubular adenomas--recall 3 yrs   Hyperlipidemia    Hypertension    Low TSH level 02/18/2016   T3 norm, T4 mildly elevated--suspected sick euthyroid syndrome.  Repeat labs 06/2016: normal.   Melanoma in situ ([place]) 06/2018   L LL   Osteoarthritis of both knees    viscosupplementation injections helpful 2020/21   Osteopenia    DEXA 08/2010; repeat DEXA 02/2015 worse: fosamax started.  06/2018 Dexa T score -2.4.  2020 maj  osteop fx risk = 24%, Hip fx risk 7.3%. 09/2020 T score -2.1   PAF (paroxysmal atrial fibrillation) ([place]) 02/2016   when in post-op for ankle surgery; spontaneously converted in hosp, seen by Dr. Debara Pickett in consultation--metoprolol rate control + xarelto recommended.  Metop d/c due to hypot.  Plan to cont xarelto 20 mg qd indef due to CHAD-VASc score of 3.  A-fib w/RVR and CHF 07/2017; pt placed on amiodarone and plan for CV, but pt was in sinus rhythm when she went in for her DC CV, so she was sent home.   Peripheral edema    Pneumonia 2015   hx with sepsis   Rheumatic fever     Past Surgical History:  Procedure Laterality Date   APPENDECTOMY  1966   done during surgery for tubal pregnancy   CATARACT EXTRACTION W/ INTRAOCULAR LENS IMPLANT  2013   bilat   COLONOSCOPY W/ POLYPECTOMY  05/2013   +diverticulosis; recall 3 yrs (Dr. Henrene Pastor)   DEXA  02/2015; 06/2018   T score -2.1 in both femoral necks; FRAX 10 yr risk of major osteoporotic fracture was 21%---fosamax started. 06/2018 T score -2.4.  T score 09/2020 -2.1. Rpt 2 yrs.   ECTOPIC PREGNANCY SURGERY     EYE SURGERY     LEFT HEART CATH AND CORONARY ANGIOGRAPHY N/A 09/01/2017   No angiographically significant CAD.  Upper normal left ventricular filling pressure.  Procedure: LEFT HEART CATH AND CORONARY ANGIOGRAPHY;  Surgeon: Nelva Bush, MD;  Location: [facility];  Service: Cardiovascular;  Laterality: N/A;   LUMBAR LAMINECTOMY/DECOMPRESSION MICRODISCECTOMY N/A 09/26/2018   Procedure: Decompressive Lumbar Laminectomy L5 S1 FORAMINOTOMY L5 S1  NERVE ROOT BILATERALLY and Microdiscectomy L5-S1 Left;  Surgeon: Latanya Maudlin, MD;  Location: [facility];  Service: Orthopedics;  Laterality: N/A;  188min   OPEN REDUCTION INTERNAL FIXATION (ORIF) TIBIA/FIBULA FRACTURE Left 02/18/2016   Procedure: OPEN REDUCTION INTERNAL FIXATION (ORIF) Right ankle trimalleolar fracture;  Surgeon: Wylene Simmer, MD;  Location: [facility];  Service: Orthopedics;   Laterality: Left;  requests 32mins   ORIF RADIAL FRACTURE  11/18/2010   left; s/p slip on slippery floor and fell   THORACENTESIS  08/2017   diagnostic and therapeutic.  Transudative.  Clx neg.  (+pulm edema/diastolic HF)   TONSILLECTOMY     TRANSTHORACIC ECHOCARDIOGRAM  02/18/2016; 08/10/17   LVEF of 55-60%, mild AI and mild MR and normal biatrial size.  07/2017--normal LV function, mild enlarge aortic root, mild/mod TR, bilat atrial enlargement.    Current Medications: Current Meds  Medication Sig   acetaminophen (TYLENOL) 650 MG CR tablet Take 650 mg by mouth in the morning and at bedtime.   albuterol (VENTOLIN HFA) 108 (90 Base) MCG/ACT inhaler INHALE 2 PUFFS BY MOUTH into THE lungs EVERY 4 HOURS AS NEEDED FOR WHEEZING OR SHORTNESS  OF BREATH   ALPRAZolam (XANAX) 0.5 MG tablet TAKE 1 TABLET BY MOUTH 3 TIMES DAILY AS NEEDED FOR ANXIETY   amLODipine (NORVASC) 10 MG tablet TAKE 1 TABLET BY MOUTH EVERY DAY   apixaban (ELIQUIS) 5 MG TABS tablet Take 1 tablet (5 mg total) by mouth 2 (two) times daily.   Apoaequorin (PREVAGEN PO) Take 1 tablet by mouth daily.   Ascorbic Acid (VITAMIN C PO) Take by mouth daily.   atorvastatin (LIPITOR) 40 MG tablet TAKE 1 TABLET BY MOUTH EVERY DAY   Biotin 5000 MCG TABS Take 5,000 mcg by mouth daily.   Calcium Carbonate (CALCIUM 600 PO) Take 1,200 mg by mouth daily.    carboxymethylcellulose (REFRESH PLUS) 0.5 % SOLN Place 2 drops into both eyes daily as needed (dry/irritated eyes.).   Coenzyme Q10 200 MG capsule Take 200 mg by mouth daily.   fluticasone (FLONASE) 50 MCG/ACT nasal spray Place 2 sprays into both nostrils daily.    furosemide (LASIX) 40 MG tablet TAKE 1 TABLET BY MOUTH EVERY DAY   hydrALAZINE (APRESOLINE) 50 MG tablet TAKE 1 TABLET BY MOUTH 3 TIMES DAILY   metoprolol tartrate (LOPRESSOR) 100 MG tablet Take 1 tablet (100 mg total) by mouth 2 (two) times daily.   Multiple Vitamins-Minerals (CENTRUM SILVER ULTRA WOMENS) TABS Take 1 tablet by mouth  every evening.   potassium chloride SA (KLOR-CON M) 20 MEQ tablet TAKE 1 TABLET BY MOUTH 2 TIMES DAILY   [DISCONTINUED] XARELTO 15 MG TABS tablet TAKE 1 TABLET BY MOUTH AT NOON     Allergies:   Augmentin [amoxicillin-pot clavulanate], Amoxicillin, and Clindamycin/lincomycin   Social History   Socioeconomic History   Marital status: Widowed    Spouse name: Not on file   Number of children: Not on file   Years of education: Not on file   Highest education level: Not on file  Occupational History   Not on file  Tobacco Use   Smoking status: Never   Smokeless tobacco: Never  Vaping Use   Vaping Use: Never used  Substance and Sexual Activity   Alcohol use: Yes    Comment: rarely   Drug use: Never   Sexual activity: Not on file  Other Topics Concern   Not on file  Social History Narrative   Widow, 2 sons.   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   No tobacco.  Rare alcohol.   No drugs.  Exercise: 4 times per week, about 8mi.   Social Determinants of Health   Financial Resource Strain: Low Risk  (10/27/2021)   Overall Financial Resource Strain (CARDIA)    Difficulty of Paying Living Expenses: Not hard at all  Food Insecurity: No Food Insecurity (12/20/2021)   Hunger Vital Sign    Worried About Running Out of Food in the Last Year: Never true    Ran Out of Food in the Last Year: Never true  Transportation Needs: No Transportation Needs (12/20/2021)   PRAPARE - Hydrologist (Medical): No    Lack of Transportation (Non-Medical): No  Physical Activity: Sufficiently Active (10/27/2021)   Exercise Vital Sign    Days of Exercise per Week: 5 days    Minutes of Exercise per Session: 30 min  Stress: No Stress Concern Present (10/27/2021)   [place]    Feeling of Stress : Not at all  Social Connections: Moderately Integrated (10/27/2021)   Social Connection and Isolation Panel [NHANES]  Frequency of Communication with Friends and Family: More than three times a week    Frequency of Social Gatherings with Friends and Family: More than three times a week    Attends Religious Services: More than 4 times per year    Active Member of [employer] or Organizations: Yes    Attends Archivist Meetings: More than 4 times per year    Marital Status: Widowed     Family History: The patient's family history includes Diabetes in her sister; Heart disease in her father and mother; Hypertension in her brother. There is no history of Colon cancer, Pancreatic cancer, Rectal cancer, or Stomach cancer.  ROS:   Please see the history of present illness.    All other systems reviewed and are negative.  EKGs/Labs/Other Studies Reviewed:    The following studies were reviewed today:  09/02/14 LHC No angiographically significant coronary artery disease. Upper normal left ventricular filling pressure.   Recommendations: Continue medical therapy of diastolic heart failure and atrial fibrillation. Gentle post-catheterization hydration given resolved acute kidney injury. Restart heparin infusion 2 hours after TR band removal; rivaroxaban could be restarted as soon as tomorrow.   Recommend to resume Rivaroxaban, at currently prescribed dose and frequency, on 09/01/17.  Concurrent antiplatelet therapy not recommended.    08/10/2017 echo complete-EF 50 to 55%, mild LVH, trivial MR, mild MR, mild dilatation of the aortic root, biatrial enlargement.   EKG:  EKG is not ordered today.    Recent Labs: 05/06/2021: ALT 24 03/15/2022: BUN 27; Creatinine, Ser 1.15; Potassium 4.0; Sodium 140  Recent Lipid Panel    Component Value Date/Time   CHOL 144 01/05/2022 1001   TRIG 113.0 01/05/2022 1001   HDL 60.90 01/05/2022 1001   CHOLHDL 2 01/05/2022 1001   VLDL 22.6 01/05/2022 1001   LDLCALC 61 01/05/2022 1001   LDLDIRECT 66.0 09/10/2018 0913     Risk Assessment/Calculations:    CHA2DS2-VASc  Score = 5   This indicates a 7.2% annual risk of stroke. The patient's score is based upon: CHF History: 1 HTN History: 1 Diabetes History: 0 Stroke History: 0 Vascular Disease History: 0 Age Score: 2 Gender Score: 1               Physical Exam:    VS:  BP 132/64 (BP Location: Left Arm, Patient Position: Sitting, Cuff Size: Large)   Pulse 66   Ht 5' 1.5" (1.562 m)   Wt 179 lb 12.8 oz (81.6 kg)   LMP  (LMP Unknown)   SpO2 92%   BMI 33.42 kg/m     Wt Readings from Last 3 Encounters:  04/07/22 179 lb 12.8 oz (81.6 kg)  03/15/22 184 lb (83.5 kg)  01/05/22 179 lb 3.2 oz (81.3 kg)     GEN:  Well nourished, well developed in no acute distress HEENT: Normal NECK: No JVD; No carotid bruits LYMPHATICS: No lymphadenopathy CARDIAC: RRR, no murmurs, rubs, gallops RESPIRATORY: Bilateral base rales noted ABDOMEN: Soft, non-tender, non-distended MUSCULOSKELETAL: +2 pitting edema; No deformity  SKIN: Warm and dry NEUROLOGIC:  Alert and oriented x 3 PSYCHIATRIC:  Normal affect   ASSESSMENT:    1. Chronic diastolic CHF (congestive heart failure) ([place])   2. Lower extremity edema   3. Paroxysmal atrial fibrillation ([facility])   4. DOE (dyspnea on exertion)   5. Essential hypertension   6. Stage 3b chronic kidney disease ([place])    PLAN:    In order of problems listed above:  Chronic diastolic heart  failure-echo in 2019 showed an EF of 50 to 55%, mild LVH.  NYHA class II today, she is volume overloaded today.  Will increase her Lasix to 80 mg for the next 2 days.  Will check her BMET, BNP today.  Will repeat her BMET in 4 days.  Repeat echocardiogram.  Continue metoprolol. Consider additional GDMT once echo obtained.   PAF -first occurrence in 2018 surrounding ankle surgery initial plans for DCCV however upon presentation she was noted to be in sinus rhythm.  Upon examination today she is in sinus rhythm.  She has been taking Xarelto, however has been taking early in am without  adequate PO intake. Discussed Eliquis, she would like to switch to Eliquis 5 mg twice a day-no indication for dose reduction. CHA2DS2-VASc Score = 5 [CHF History: 1, HTN History: 1, Diabetes History: 0, Stroke History: 0, Vascular Disease History: 0, Age Score: 2, Gender Score: 1].  Therefore, the patient's annual risk of stroke is 7.2 %.    Continue metoprolol.   Hypertension-blood pressure today is 132/64, marginally elevated however she is volume overloaded.  Continue current antihypertensive medication regimen.  Hyperlipidemia-LDL on 01/12/2022 was well-controlled at 61, continue Lipitor . CKD stage IIIb -most recent GFR was 43, will repeat BMET today as well after diuresing above.  Disposition-BNP, BMET, CBC today, repeat echo, return in 2 week.  Stop Xarelto, start Eliquis 5 mg twice a day.       Medication Adjustments/Labs and Tests Ordered: Current medicines are reviewed at length with the patient today.  Concerns regarding medicines are outlined above.  Orders Placed This Encounter  Procedures   Basic metabolic panel   CBC   Brain natriuretic peptide   Basic metabolic panel   ECHOCARDIOGRAM COMPLETE   Meds ordered this encounter  Medications   apixaban (ELIQUIS) 5 MG TABS tablet    Sig: Take 1 tablet (5 mg total) by mouth 2 (two) times daily.    Dispense:  180 tablet    Refill:  3    Patient Instructions  Medication Instructions:  STOP XARELTO WHEN FINISHED AND START ELIQUIS 5MG  TWICE DAILY  TAKE EXTRA FUROSEMIDE FOR THE NEXT 2 DAYS *If you need a refill on your cardiac medications before your next appointment, please call your pharmacy*  Lab Work: BNP AND BMET CBC TODAY AND BMET ON MONDAY If you have labs (blood work) drawn today and your tests are completely normal, you will receive your results only by: [place] (if you have MyChart) OR  A paper copy in the mail If you have any lab test that is abnormal or we need to change your treatment, we will call  you to review the results.  Testing/Procedures: Echocardiogram - Your physician has requested that you have an echocardiogram. Echocardiography is a painless test that uses sound waves to create images of your heart. It provides your doctor with information about the size and shape of your heart and how well your heart's chambers and valves are working. This procedure takes approximately one hour. There are no restrictions for this procedure.    Follow-Up: At [facility], you and your health needs are our priority.  As part of our continuing mission to provide you with exceptional heart care, we have created designated Provider Care Teams.  These Care Teams include your primary Cardiologist (physician) and Advanced Practice Providers (APPs -  Physician Assistants and Nurse Practitioners) who all work together to provide you with the care you need, when you need  it.  We recommend signing up for the patient portal called "MyChart".  Sign up information is provided on this After Visit Summary.  MyChart is used to connect with patients for Virtual Visits (Telemedicine).  Patients are able to view lab/test results, encounter notes, upcoming appointments, etc.  Non-urgent messages can be sent to your provider as well.   To learn more about what you can do with MyChart, go to [email or web address].    Your next appointment:   2 week(s)  Provider:   [name], MD  or Coletta Memos, FNP        Other Instructions     Signed, Trudi Ida, NP  04/07/2022 12:42 PM    [place]

## 2022-04-08 LAB — BASIC METABOLIC PANEL WITH GFR
BUN/Creatinine Ratio: 22 (ref 12–28)
BUN: 26 mg/dL (ref 8–27)
CO2: 26 mmol/L (ref 20–29)
Calcium: 10.2 mg/dL (ref 8.7–10.3)
Chloride: 102 mmol/L (ref 96–106)
Creatinine, Ser: 1.19 mg/dL — ABNORMAL HIGH (ref 0.57–1.00)
Glucose: 98 mg/dL (ref 70–99)
Potassium: 4.8 mmol/L (ref 3.5–5.2)
Sodium: 144 mmol/L (ref 134–144)
eGFR: 45 mL/min/1.73 — ABNORMAL LOW

## 2022-04-08 LAB — CBC
Hematocrit: 42.4 % (ref 34.0–46.6)
Hemoglobin: 13.9 g/dL (ref 11.1–15.9)
MCH: 28.7 pg (ref 26.6–33.0)
MCHC: 32.8 g/dL (ref 31.5–35.7)
MCV: 88 fL (ref 79–97)
Platelets: 317 x10E3/uL (ref 150–450)
RBC: 4.84 x10E6/uL (ref 3.77–5.28)
RDW: 13.1 % (ref 11.7–15.4)
WBC: 7.8 x10E3/uL (ref 3.4–10.8)

## 2022-04-08 LAB — BRAIN NATRIURETIC PEPTIDE: BNP: 156.2 pg/mL — ABNORMAL HIGH (ref 0.0–100.0)

## 2022-04-11 ENCOUNTER — Encounter: Payer: Self-pay | Admitting: Cardiology

## 2022-04-11 DIAGNOSIS — R278 Other lack of coordination: Secondary | ICD-10-CM | POA: Diagnosis not present

## 2022-04-11 DIAGNOSIS — R262 Difficulty in walking, not elsewhere classified: Secondary | ICD-10-CM | POA: Diagnosis not present

## 2022-04-12 DIAGNOSIS — R0609 Other forms of dyspnea: Secondary | ICD-10-CM | POA: Diagnosis not present

## 2022-04-12 DIAGNOSIS — R600 Localized edema: Secondary | ICD-10-CM | POA: Diagnosis not present

## 2022-04-13 ENCOUNTER — Telehealth: Payer: Self-pay

## 2022-04-13 DIAGNOSIS — R278 Other lack of coordination: Secondary | ICD-10-CM | POA: Diagnosis not present

## 2022-04-13 DIAGNOSIS — R262 Difficulty in walking, not elsewhere classified: Secondary | ICD-10-CM | POA: Diagnosis not present

## 2022-04-13 LAB — BASIC METABOLIC PANEL WITH GFR
BUN/Creatinine Ratio: 33 — ABNORMAL HIGH (ref 12–28)
BUN: 39 mg/dL — ABNORMAL HIGH (ref 8–27)
CO2: 23 mmol/L (ref 20–29)
Calcium: 10.3 mg/dL (ref 8.7–10.3)
Chloride: 103 mmol/L (ref 96–106)
Creatinine, Ser: 1.17 mg/dL — ABNORMAL HIGH (ref 0.57–1.00)
Glucose: 92 mg/dL (ref 70–99)
Potassium: 4.7 mmol/L (ref 3.5–5.2)
Sodium: 141 mmol/L (ref 134–144)
eGFR: 46 mL/min/1.73 — ABNORMAL LOW

## 2022-04-13 NOTE — Telephone Encounter (Signed)
-----   Message from Trudi Ida, NP sent at 04/13/2022  3:19 PM EDT ----- Hi Ms. Acre, you repeat labs looked stable. How are you feeling? Did your swelling go down? How is your breathing?  Best, Anderson Malta

## 2022-04-13 NOTE — Telephone Encounter (Signed)
Patient states she feels fine. Her swelling went down but her ankles will swell if she does not wear her support hose. Her breathing is good. She does not have any questions at this time.

## 2022-04-14 ENCOUNTER — Ambulatory Visit (HOSPITAL_COMMUNITY): Payer: Medicare PPO

## 2022-04-18 ENCOUNTER — Other Ambulatory Visit: Payer: Self-pay | Admitting: Family Medicine

## 2022-04-18 ENCOUNTER — Other Ambulatory Visit: Payer: Medicare PPO

## 2022-04-19 DIAGNOSIS — R278 Other lack of coordination: Secondary | ICD-10-CM | POA: Diagnosis not present

## 2022-04-19 DIAGNOSIS — R262 Difficulty in walking, not elsewhere classified: Secondary | ICD-10-CM | POA: Diagnosis not present

## 2022-04-20 DIAGNOSIS — R278 Other lack of coordination: Secondary | ICD-10-CM | POA: Diagnosis not present

## 2022-04-20 DIAGNOSIS — R262 Difficulty in walking, not elsewhere classified: Secondary | ICD-10-CM | POA: Diagnosis not present

## 2022-04-22 ENCOUNTER — Other Ambulatory Visit: Payer: Medicare PPO

## 2022-04-22 DIAGNOSIS — R278 Other lack of coordination: Secondary | ICD-10-CM | POA: Diagnosis not present

## 2022-04-22 DIAGNOSIS — R262 Difficulty in walking, not elsewhere classified: Secondary | ICD-10-CM | POA: Diagnosis not present

## 2022-04-27 NOTE — Progress Notes (Signed)
Cardiology Clinic Note   Patient Name: Brenda Matthews Date of Encounter: 04/28/2022  Primary Care Provider:  Jeoffrey Massed, MD Primary Cardiologist:  Chrystie Nose, MD  Patient Profile    Brenda Matthews 87 year old female presents to the clinic today for follow-up evaluation of her essential hypertension and paroxysmal atrial fibrillation.  Past Medical History    Past Medical History:  Diagnosis Date   Abnormal EKG approx 2008   Nuclear stress test neg;    Acute pancreatitis 09/2020   idiopathic.  +panc psudocyst   Anxiety    with panic   CAP (community acquired pneumonia) 08/2017   Hospitalization for CAP/acute diast HF/rapid a-fib   Cataract    s/p surgery--lens implants   Chronic diastolic heart failure 2019   Chronic renal insufficiency, stage 3 (moderate) 12/04/2012   Renal u/s when in hosp 08/2017 for CAP/CHF showed symmetric kidneys, echogenicity normal, w/out hydronephrosis. Baseline GFR around 40 ml/min as of 10/2018.   DDD (degenerative disc disease), lumbar    Diverticulosis 2009   Fracture of radial shaft, left, closed 11/16/2010   fell down flight of stairs   History of kidney stones    Hx of adenomatous colonic polyps 2002;2009;2015   surveillance colonoscopy 2009, +polypectomy done-tubular adenoma w/out high grade.  05/2013 tubular adenomas--recall 3 yrs   Hyperlipidemia    Hypertension    Low TSH level 02/18/2016   T3 norm, T4 mildly elevated--suspected sick euthyroid syndrome.  Repeat labs 06/2016: normal.   Melanoma in situ 06/2018   L LL   Osteoarthritis of both knees    viscosupplementation injections helpful 2020/21   Osteopenia    DEXA 08/2010; repeat DEXA 02/2015 worse: fosamax started.  06/2018 Dexa T score -2.4.  2020 maj osteop fx risk = 24%, Hip fx risk 7.3%. 09/2020 T score -2.1   PAF (paroxysmal atrial fibrillation) 02/2016   when in post-op for ankle surgery; spontaneously converted in hosp, seen by Dr. Rennis Golden in consultation--metoprolol  rate control + xarelto recommended.  Metop d/c due to hypot.  Plan to cont xarelto 20 mg qd indef due to CHAD-VASc score of 3.  A-fib w/RVR and CHF 07/2017; pt placed on amiodarone and plan for CV, but pt was in sinus rhythm when she went in for her DC CV, so she was sent home.   Peripheral edema    Pneumonia 2015   hx with sepsis   Rheumatic fever    Past Surgical History:  Procedure Laterality Date   APPENDECTOMY  1966   done during surgery for tubal pregnancy   CATARACT EXTRACTION W/ INTRAOCULAR LENS IMPLANT  2013   bilat   COLONOSCOPY W/ POLYPECTOMY  05/2013   +diverticulosis; recall 3 yrs (Dr. Marina Goodell)   DEXA  02/2015; 06/2018   T score -2.1 in both femoral necks; FRAX 10 yr risk of major osteoporotic fracture was 21%---fosamax started. 06/2018 T score -2.4.  T score 09/2020 -2.1. Rpt 2 yrs.   ECTOPIC PREGNANCY SURGERY     EYE SURGERY     LEFT HEART CATH AND CORONARY ANGIOGRAPHY N/A 09/01/2017   No angiographically significant CAD.  Upper normal left ventricular filling pressure.  Procedure: LEFT HEART CATH AND CORONARY ANGIOGRAPHY;  Surgeon: Yvonne Kendall, MD;  Location: [facility];  Service: Cardiovascular;  Laterality: N/A;   LUMBAR LAMINECTOMY/DECOMPRESSION MICRODISCECTOMY N/A 09/26/2018   Procedure: Decompressive Lumbar Laminectomy L5 S1 FORAMINOTOMY L5 S1  NERVE ROOT BILATERALLY and Microdiscectomy L5-S1 Left;  Surgeon: Ranee Gosselin,  MD;  Location: [facility];  Service: Orthopedics;  Laterality: N/A;    OPEN REDUCTION INTERNAL FIXATION (ORIF) TIBIA/FIBULA FRACTURE Left 02/18/2016   Procedure: OPEN REDUCTION INTERNAL FIXATION (ORIF) Right ankle trimalleolar fracture;  Surgeon: Toni Arthurs, MD;  Location: [facility] OR;  Service: Orthopedics;  Laterality: Left;  requests   ORIF RADIAL FRACTURE  11/18/2010   left; s/p slip on slippery floor and fell   THORACENTESIS  08/2017   diagnostic and therapeutic.  Transudative.  Clx neg.  (+pulm edema/diastolic HF)   TONSILLECTOMY      TRANSTHORACIC ECHOCARDIOGRAM  02/18/2016; 08/10/17   LVEF of 55-60%, mild AI and mild MR and normal biatrial size.  07/2017--normal LV function, mild enlarge aortic root, mild/mod TR, bilat atrial enlargement.    Allergies  Allergies  Allergen Reactions   Augmentin [Amoxicillin-Pot Clavulanate] Nausea And Vomiting and Other (See Comments)    "projectile vomiting" Has patient had a PCN reaction causing immediate rash, facial/tongue/throat swelling, SOB or lightheadedness with hypotension:No Has patient had a PCN reaction causing severe rash involving mucus membranes or skin necrosis:No Has patient had a PCN reaction that required hospitalization:No Has patient had a PCN reaction occurring within the last 10 years:Yes If all of the above answers are "NO", then may proceed with Cephalosporin use.    Amoxicillin Rash   Clindamycin/Lincomycin Rash    History of Present Illness    Brenda Matthews has a PMH of HTN, PAF, chronic diastolic CHF, hyperlipidemia, anemia, anxiety, insomnia, DOE, status post thoracentesis, and edema.  She underwent cardiac catheterization 8/19 which showed normal coronaries.  She was seen by Dr. Rennis Golden 2/18 for new onset atrial fibrillation with RVR after induction with anesthesia for ankle fracture.  Her echocardiogram showed normal LV function.  She was initially placed on low-dose beta-blocker and Xarelto.  She was taken off beta-blocker due to low blood pressure and converted back to sinus rhythm.  CHA2DS2-VASc score 3.  She was advised to continue Xarelto.  She had a recurrent episode of atrial fibrillation with RVR 7/19.  Her echocardiogram at that time showed an LV function of 50-55%, mild MR, mild-moderate TR.  She was started on amiodarone and outpatient DCCV was scheduled.  On presentation for DCCV she was found to be in sinus rhythm.  She was seen in follow-up by Dr. Rennis Golden on 5/21.  During that time she continued to do well from cardiac standpoint.  Her  amiodarone was discontinued.  She was seen in follow-up by Marjie Skiff, PA-C on 09/24/2020.  At that time she reported that she had been admitted 09/14/2020 until 09/19/2020 with an acute episode of pancreatitis and pancreatic pseudocyst.  GI was consulted and felt conservative management was her best option.  She was also treated for acute UTI.  She had no cardiac issues during her admission.  She was discharged to [facility].  On follow-up she continues to do well from a cardiac standpoint.  She reported that during her hospitalization she received IV fluids and had significant lower extremity swelling.  Her left leg was noted to be chronically larger than her right.  She was elevating her legs and using compression stockings.  She noted occasional shortness of breath that would improve with inhaler use.  She denied orthopnea and PND.  She occasionally noted palpitations which would last for 2-3 minutes and resolved.  She denied lightheadedness, dizziness and syncope.  She presented to the clinic 09/06/21 for follow-up evaluation stated she feels well today.  She  occasionally noticed episodes of dizziness.  They dissipated without intervention.  She also noticed some mild increased work of breathing with walking long distances.  She did not notice increased shortness of breath with normal activities.  She had been going to the gym 3 times per week at [facility].  She has noticed some loose stools and will be following up with GI related to her pancreatitis.  Her ekg showed normal sinus rhythm 62 bpm.  I planned follow-up in 6 months.  She has a son that lives in [place].  She was seen in follow-up by Wallis Bamberg, NP-C 04/07/2022.  She was noted to be fluid volume overloaded.  Her furosemide was increased x 2 days.  Her lab work remained stable.  Follow-up was planned for 2 weeks.  She presents to the clinic today for follow-up evaluation and states she is concerned about her weight this  morning.  She has been weighing herself at home and reports that her weight has been stable.  She is doing physical therapy.  She denies shortness of breath at rest.  She does note dyspnea on exertion.  We reviewed her recent lab work and she expressed understanding.  We reviewed the importance of avoiding salt and fluid restriction.  She has an upcoming echocardiogram.  She is noted to have ankle edema and weight increase.  I will double her furosemide x 3 days as well as her potassium for 3 days and then resume normal dosing.  I will order a BMP in 1 week and plan follow-up in 1 month.   Today she denies chest pain,  lower extremity edema, fatigue, palpitations, melena, hematuria, hemoptysis, diaphoresis, weakness, presyncope, syncope, orthopnea, and PND.     Home Medications    Prior to Admission medications   Medication Sig Start Date End Date Taking? Authorizing Provider  acetaminophen (TYLENOL) 650 MG CR tablet Take 650 mg by mouth in the morning and at bedtime. Patient not taking: Reported on 05/06/2021    [provider]  albuterol (VENTOLIN HFA) 108 (90 Base) MCG/ACT inhaler INHALE 2 PUFFS BY MOUTH into THE lungs EVERY 4 HOURS AS NEEDED FOR WHEEZING OR SHORTNESS OF BREATH 08/25/21   McGowen, Maryjean Morn, MD  ALPRAZolam Prudy Feeler) 0.5 MG tablet 1 tab po tid prn anxiety 04/05/21   McGowen, Maryjean Morn, MD  amLODipine (NORVASC) 10 MG tablet TAKE 1 TABLET BY MOUTH EVERY DAY 07/27/21   McGowen, Maryjean Morn, MD  Apoaequorin (PREVAGEN PO) Take 1 tablet by mouth daily.    [provider]  Ascorbic Acid (VITAMIN C PO) Take by mouth daily.    [provider]  atorvastatin (LIPITOR) 40 MG tablet TAKE 1 TABLET BY MOUTH EVERY DAY 07/27/21   McGowen, Maryjean Morn, MD  Biotin 5000 MCG TABS Take 5,000 mcg by mouth daily.    [provider]  Calcium Carbonate (CALCIUM 600 PO) Take 1,200 mg by mouth daily.     [provider]  carboxymethylcellulose (REFRESH PLUS) 0.5 % SOLN Place  2 drops into both eyes daily as needed (dry/irritated eyes.). Patient not taking: Reported on 05/06/2021    [provider]  Coenzyme Q10 200 MG capsule Take 200 mg by mouth daily.    [provider]  fluticasone (FLONASE) 50 MCG/ACT nasal spray Place 2 sprays into both nostrils daily.     [provider]  furosemide (LASIX) 40 MG tablet Take 1 tablet (40 mg total) by mouth daily. 08/27/20   McGowen, Maryjean Morn,  MD  hydrALAZINE (APRESOLINE) 50 MG tablet TAKE 1 TABLET BY MOUTH 3 TIMES DAILY 07/27/21   McGowen, Maryjean Morn, MD  metoprolol tartrate (LOPRESSOR) 100 MG tablet TAKE 1 TABLET BY MOUTH 2 TIMES DAILY 04/28/21   Hilty, Lisette Abu, MD  Multiple Vitamins-Minerals (CENTRUM SILVER ULTRA WOMENS) TABS Take 1 tablet by mouth every evening.    [provider]  potassium chloride SA (KLOR-CON) 20 MEQ tablet TAKE 1 TABLET BY MOUTH 2 TIMES DAILY 10/14/20   Hilty, Lisette Abu, MD  Rivaroxaban (XARELTO) 15 MG TABS tablet TAKE 1 TABLET BY MOUTH EVERY DAY AT [facility] 08/03/21   Hilty, Lisette Abu, MD    Family History    Family History  Problem Relation Age of Onset   Heart disease Mother    Heart disease Father    Hypertension Brother    Diabetes Sister    Colon cancer Neg Hx    Pancreatic cancer Neg Hx    Rectal cancer Neg Hx    Stomach cancer Neg Hx    She indicated that her mother is deceased. She indicated that her father is deceased. She indicated that her sister is deceased. She indicated that the status of her brother is unknown. She indicated that the status of her neg hx is unknown.  Social History    Social History   Socioeconomic History   Marital status: Widowed    Spouse name: Not on file   Number of children: Not on file   Years of education: Not on file   Highest education level: Not on file  Occupational History   Not on file  Tobacco Use   Smoking status: Never   Smokeless tobacco: Never  Vaping Use   Vaping Use: Never used  Substance and Sexual  Activity   Alcohol use: Yes    Comment: rarely   Drug use: Never   Sexual activity: Not on file  Other Topics Concern   Not on file  Social History Narrative   Widow, 2 sons.   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   No tobacco.  Rare alcohol.   No drugs.  Exercise: 4 times per week, about 4mi.   Social Determinants of Health   Financial Resource Strain: Low Risk  (10/27/2021)   Overall Financial Resource Strain (CARDIA)    Difficulty of Paying Living Expenses: Not hard at all  Food Insecurity: No Food Insecurity (12/20/2021)   Hunger Vital Sign    Worried About Running Out of Food in the Last Year: Never true    Ran Out of Food in the Last Year: Never true  Transportation Needs: No Transportation Needs (12/20/2021)   PRAPARE - Administrator, Civil Service (Medical): No    Lack of Transportation (Non-Medical): No  Physical Activity: Sufficiently Active (10/27/2021)   Exercise Vital Sign    Days of Exercise per Week: 5 days    Minutes of Exercise per Session: 30 min  Stress: No Stress Concern Present (10/27/2021)   [employer] of Occupational Health - Occupational Stress Questionnaire    Feeling of Stress : Not at all  Social Connections: Moderately Integrated (10/27/2021)   Social Connection and Isolation Panel [NHANES]    Frequency of Communication with Friends and Family: More than three times a week    Frequency of Social Gatherings with Friends and Family: More than three times a week    Attends Religious Services: More than 4 times per year    Active Member of [employer] or Organizations:  Yes    Attends Club or Organization Meetings: More than 4 times per year    Marital Status: Widowed  Intimate Partner Violence: Not At Risk (10/27/2021)   Humiliation, Afraid, Rape, and Kick questionnaire    Fear of Current or Ex-Partner: No    Emotionally Abused: No    Physically Abused: No    Sexually Abused: No     Review of Systems    General:  No chills, fever, night  sweats or weight changes.  Cardiovascular:  No chest pain, dyspnea on exertion, edema, orthopnea, palpitations, paroxysmal nocturnal dyspnea. Dermatological: No rash, lesions/masses Respiratory: No cough, dyspnea Urologic: No hematuria, dysuria Abdominal:   No nausea, vomiting, diarrhea, bright red blood per rectum, melena, or hematemesis Neurologic:  No visual changes, wkns, changes in mental status. All other systems reviewed and are otherwise negative except as noted above.  Physical Exam    VS:  BP 132/82   Pulse 65   Ht 5' 1.5" (1.562 m)   Wt 185 lb 6.4 oz (84.1 kg)   LMP  (LMP Unknown)   SpO2 94%   BMI 34.46 kg/m  , BMI Body mass index is 34.46 kg/m. GEN: Well nourished, well developed, in no acute distress. HEENT: normal. Neck: Supple, no JVD, carotid bruits, or masses. Cardiac: RRR, no murmurs, rubs, or gallops. No clubbing, cyanosis, bilateral 1+ ankle edema.  Radials/DP/PT 2+ and equal bilaterally.  Respiratory:  Respirations regular and unlabored, clear to auscultation bilaterally. GI: Soft, nontender, nondistended, BS + x 4. MS: no deformity or atrophy. Skin: warm and dry, no rash. Neuro:  Strength and sensation are intact. Psych: Normal affect.  Accessory Clinical Findings    Recent Labs: 05/06/2021: ALT 24 04/07/2022: BNP 156.2; Hemoglobin 13.9; Platelets 317 04/12/2022: BUN 39; Creatinine, Ser 1.17; Potassium 4.7; Sodium 141   Recent Lipid Panel    Component Value Date/Time   CHOL 144 01/05/2022 1001   TRIG 113.0 01/05/2022 1001   HDL 60.90 01/05/2022 1001   CHOLHDL 2 01/05/2022 1001   VLDL 22.6 01/05/2022 1001   LDLCALC 61 01/05/2022 1001   LDLDIRECT 66.0 09/10/2018 0913    ECG personally reviewed by me today-sinus rhythm anterior infarct undetermined age 69 bpm.  EKG 09/06/2021 normal sinus rhythm left axis deviation anterior infarct undetermined age 72 bpm- No acute changes  Echocardiogram 08/10/2017  Study Conclusions   - Left ventricle: The  cavity size was normal. Wall thickness was    increased in a pattern of mild LVH. Systolic function was normal.    The estimated ejection fraction was in the range of 50% to 55%.    Wall motion was normal; there were no regional wall motion    abnormalities.  - Aortic valve: There was trivial regurgitation.  - Aortic root: The aortic root was mildly dilated.  - Mitral valve: There was mild regurgitation.  - Left atrium: The atrium was severely dilated.  - Right atrium: The atrium was mildly dilated.  - Tricuspid valve: There was mild-moderate regurgitation.  - Pulmonary arteries: PA peak pressure: 32 mm Hg (S).  - Pericardium, extracardiac: A trivial pericardial effusion was    identified.   Impressions:   - Normal LV systolic function; mild LVH; trace AI; mildly dilated    aortic root; mild MR; biatrial enlargement; mild to moderate TR.  Assessment & Plan   1.Chronic diastolic CHF-continues with shortness of breath with increased physical activity.  Bilateral 1+ ankle edema.  No increased DOE or activity intolerance.  Left  leg chronically larger than right. Continue hydralazine, metoprolol,  Increase furosemide to 80 mg x 3 days then resume normal dosing, increase potassium to 40 mill equivalents twice daily then resume normal dosing Heart healthy low-sodium diet-salty 6 reviewed Increase physical activity as tolerated-continues to do group exercise at [employer]. Daily weights-weight log given Continue lower extremity support stockings Proceed to echocardiogram Fluid restriction less than 50 ounces daily Repeat BMP in 1 week  Paroxysmal atrial fibrillation-heart rate today 65 .  Denies recent episodes of increased or accelerated heartbeat.  Beta-blocker previously stopped due to low blood pressure chest.  CHA2DS2-VASc score 5.  Denies bleeding issues. Avoid triggers Continue Eliquis, metoprolol Heart healthy low-sodium diet   Primary hypertension-BP today 132/82.    Continue current medication regimen Heart healthy low-sodium diet Maintain blood pressure log  Hyperlipidemia-LDL 61 on 01/12/2022 Continue atorvastatin Heart healthy low-sodium diet Increase physical activity as tolerated  CKD stage III-creatinine stable on last check. Follows with PCP  Disposition: Follow-up with Dr. Rennis Golden or APP in 1 months.   Thomasene Ripple. Eugenie Harewood NP-C     04/28/2022, 9:14 AM [facility] [address] Office [phone] Fax [phone]  Notice: This dictation was prepared with Dragon dictation along with smaller phrase technology. Any transcriptional errors that result from this process are unintentional and may not be corrected upon review.  I spent 14 minutes examining this patient, reviewing medications, and using patient centered shared decision making involving her cardiac care.  Prior to her visit I spent greater than 20 minutes reviewing her past medical history,  medications, and prior cardiac tests.

## 2022-04-28 ENCOUNTER — Encounter: Payer: Self-pay | Admitting: General Practice

## 2022-04-28 ENCOUNTER — Ambulatory Visit: Payer: Medicare PPO | Attending: General Practice | Admitting: General Practice

## 2022-04-28 VITALS — BP 132/82 | HR 65 | Ht 61.5 in | Wt 185.4 lb

## 2022-04-28 DIAGNOSIS — I5032 Chronic diastolic (congestive) heart failure: Secondary | ICD-10-CM | POA: Diagnosis not present

## 2022-04-28 DIAGNOSIS — E785 Hyperlipidemia, unspecified: Secondary | ICD-10-CM

## 2022-04-28 DIAGNOSIS — I480 Paroxysmal atrial fibrillation: Secondary | ICD-10-CM | POA: Diagnosis not present

## 2022-04-28 DIAGNOSIS — I10 Essential (primary) hypertension: Secondary | ICD-10-CM

## 2022-04-28 DIAGNOSIS — N1832 Chronic kidney disease, stage 3b: Secondary | ICD-10-CM

## 2022-04-28 NOTE — Patient Instructions (Addendum)
Medication Instructions:  INCREASE FUROSEMIDE 80MG  X3DAYS THEN BACK TO 40MG   INCREASE POTASSIUM 40MG  TWICE DAILY FOR 3 DAYS THEN BACK TO 20MG  TWICE DAILY  *If you need a refill on your cardiac medications before your next appointment, please call your pharmacy*  Lab Work: BMET IN 1 WEEK If you have labs (blood work) drawn today and your tests are completely normal, you will receive your results only by: MyChart Message (if you have MyChart) OR A paper copy in the mail If you have any lab test that is abnormal or we need to change your treatment, we will call you to review the results.  Testing/Procedures: MAKE SURE TO KEEP ECHO APPOINTMENT  Follow-Up: At [facility], you and your health needs are our priority.  As part of our continuing mission to provide you with exceptional heart care, we have created designated Provider Care Teams.  These Care Teams include your primary Cardiologist (physician) and Advanced Practice Providers (APPs -  Physician Assistants and Nurse Practitioners) who all work together to provide you with the care you need, when you need it.  We recommend signing up for the patient portal called "MyChart".  Sign up information is provided on this After Visit Summary.  MyChart is used to connect with patients for Virtual Visits (Telemedicine).  Patients are able to view lab/test results, encounter notes, upcoming appointments, etc.  Non-urgent messages can be sent to your provider as well.   To learn more about what you can do with MyChart, go to [email or web address].    Your next appointment:   1 month(s)  Provider:   Edd Fabian, FNP-C    Other Instructions FLUID RESTRICTION < 50OZ DAILY  TAKE AND LOG YOUR WEIGHT DAILY

## 2022-04-30 ENCOUNTER — Other Ambulatory Visit: Payer: Self-pay | Admitting: Family Medicine

## 2022-05-02 ENCOUNTER — Other Ambulatory Visit: Payer: Self-pay | Admitting: Family Medicine

## 2022-05-02 DIAGNOSIS — R278 Other lack of coordination: Secondary | ICD-10-CM | POA: Diagnosis not present

## 2022-05-02 DIAGNOSIS — R262 Difficulty in walking, not elsewhere classified: Secondary | ICD-10-CM | POA: Diagnosis not present

## 2022-05-03 DIAGNOSIS — R262 Difficulty in walking, not elsewhere classified: Secondary | ICD-10-CM | POA: Diagnosis not present

## 2022-05-03 DIAGNOSIS — R278 Other lack of coordination: Secondary | ICD-10-CM | POA: Diagnosis not present

## 2022-05-09 DIAGNOSIS — R262 Difficulty in walking, not elsewhere classified: Secondary | ICD-10-CM | POA: Diagnosis not present

## 2022-05-09 DIAGNOSIS — R278 Other lack of coordination: Secondary | ICD-10-CM | POA: Diagnosis not present

## 2022-05-10 DIAGNOSIS — R262 Difficulty in walking, not elsewhere classified: Secondary | ICD-10-CM | POA: Diagnosis not present

## 2022-05-10 DIAGNOSIS — R278 Other lack of coordination: Secondary | ICD-10-CM | POA: Diagnosis not present

## 2022-05-12 ENCOUNTER — Ambulatory Visit (HOSPITAL_COMMUNITY): Payer: Medicare PPO

## 2022-05-12 DIAGNOSIS — R262 Difficulty in walking, not elsewhere classified: Secondary | ICD-10-CM | POA: Diagnosis not present

## 2022-05-12 DIAGNOSIS — R278 Other lack of coordination: Secondary | ICD-10-CM | POA: Diagnosis not present

## 2022-05-13 ENCOUNTER — Ambulatory Visit (HOSPITAL_COMMUNITY): Payer: Medicare PPO

## 2022-05-19 DIAGNOSIS — R2681 Unsteadiness on feet: Secondary | ICD-10-CM | POA: Diagnosis not present

## 2022-05-19 DIAGNOSIS — R278 Other lack of coordination: Secondary | ICD-10-CM | POA: Diagnosis not present

## 2022-05-20 DIAGNOSIS — R278 Other lack of coordination: Secondary | ICD-10-CM | POA: Diagnosis not present

## 2022-05-20 DIAGNOSIS — R2681 Unsteadiness on feet: Secondary | ICD-10-CM | POA: Diagnosis not present

## 2022-05-23 DIAGNOSIS — R278 Other lack of coordination: Secondary | ICD-10-CM | POA: Diagnosis not present

## 2022-05-23 DIAGNOSIS — R2681 Unsteadiness on feet: Secondary | ICD-10-CM | POA: Diagnosis not present

## 2022-05-26 DIAGNOSIS — R278 Other lack of coordination: Secondary | ICD-10-CM | POA: Diagnosis not present

## 2022-05-26 DIAGNOSIS — R2681 Unsteadiness on feet: Secondary | ICD-10-CM | POA: Diagnosis not present

## 2022-05-30 ENCOUNTER — Ambulatory Visit (HOSPITAL_COMMUNITY): Payer: Medicare PPO

## 2022-05-31 DIAGNOSIS — R278 Other lack of coordination: Secondary | ICD-10-CM | POA: Diagnosis not present

## 2022-05-31 DIAGNOSIS — R2681 Unsteadiness on feet: Secondary | ICD-10-CM | POA: Diagnosis not present

## 2022-06-06 DIAGNOSIS — R2681 Unsteadiness on feet: Secondary | ICD-10-CM | POA: Diagnosis not present

## 2022-06-06 DIAGNOSIS — R278 Other lack of coordination: Secondary | ICD-10-CM | POA: Diagnosis not present

## 2022-06-07 DIAGNOSIS — R278 Other lack of coordination: Secondary | ICD-10-CM | POA: Diagnosis not present

## 2022-06-07 DIAGNOSIS — R2681 Unsteadiness on feet: Secondary | ICD-10-CM | POA: Diagnosis not present

## 2022-06-09 ENCOUNTER — Ambulatory Visit (HOSPITAL_BASED_OUTPATIENT_CLINIC_OR_DEPARTMENT_OTHER): Payer: Medicare PPO

## 2022-06-09 DIAGNOSIS — K5790 Diverticulosis of intestine, part unspecified, without perforation or abscess without bleeding: Secondary | ICD-10-CM | POA: Diagnosis not present

## 2022-06-09 DIAGNOSIS — T502X5A Adverse effect of carbonic-anhydrase inhibitors, benzothiadiazides and other diuretics, initial encounter: Secondary | ICD-10-CM | POA: Diagnosis not present

## 2022-06-09 DIAGNOSIS — M8580 Other specified disorders of bone density and structure, unspecified site: Secondary | ICD-10-CM | POA: Diagnosis present

## 2022-06-09 DIAGNOSIS — R600 Localized edema: Secondary | ICD-10-CM | POA: Diagnosis not present

## 2022-06-09 DIAGNOSIS — J90 Pleural effusion, not elsewhere classified: Secondary | ICD-10-CM | POA: Diagnosis not present

## 2022-06-09 DIAGNOSIS — I509 Heart failure, unspecified: Secondary | ICD-10-CM | POA: Diagnosis not present

## 2022-06-09 DIAGNOSIS — R0603 Acute respiratory distress: Secondary | ICD-10-CM | POA: Diagnosis not present

## 2022-06-09 DIAGNOSIS — I480 Paroxysmal atrial fibrillation: Secondary | ICD-10-CM | POA: Diagnosis not present

## 2022-06-09 DIAGNOSIS — Z751 Person awaiting admission to adequate facility elsewhere: Secondary | ICD-10-CM | POA: Diagnosis not present

## 2022-06-09 DIAGNOSIS — M170 Bilateral primary osteoarthritis of knee: Secondary | ICD-10-CM | POA: Diagnosis present

## 2022-06-09 DIAGNOSIS — E876 Hypokalemia: Secondary | ICD-10-CM | POA: Diagnosis not present

## 2022-06-09 DIAGNOSIS — N1832 Chronic kidney disease, stage 3b: Secondary | ICD-10-CM | POA: Diagnosis present

## 2022-06-09 DIAGNOSIS — R0609 Other forms of dyspnea: Secondary | ICD-10-CM

## 2022-06-09 DIAGNOSIS — M5136 Other intervertebral disc degeneration, lumbar region: Secondary | ICD-10-CM | POA: Diagnosis not present

## 2022-06-09 DIAGNOSIS — I959 Hypotension, unspecified: Secondary | ICD-10-CM | POA: Diagnosis not present

## 2022-06-09 DIAGNOSIS — I130 Hypertensive heart and chronic kidney disease with heart failure and stage 1 through stage 4 chronic kidney disease, or unspecified chronic kidney disease: Secondary | ICD-10-CM | POA: Diagnosis present

## 2022-06-09 DIAGNOSIS — J9621 Acute and chronic respiratory failure with hypoxia: Secondary | ICD-10-CM | POA: Diagnosis present

## 2022-06-09 DIAGNOSIS — Z881 Allergy status to other antibiotic agents status: Secondary | ICD-10-CM | POA: Diagnosis not present

## 2022-06-09 DIAGNOSIS — Z8249 Family history of ischemic heart disease and other diseases of the circulatory system: Secondary | ICD-10-CM | POA: Diagnosis not present

## 2022-06-09 DIAGNOSIS — L03115 Cellulitis of right lower limb: Secondary | ICD-10-CM | POA: Diagnosis not present

## 2022-06-09 DIAGNOSIS — I10 Essential (primary) hypertension: Secondary | ICD-10-CM | POA: Diagnosis not present

## 2022-06-09 DIAGNOSIS — F419 Anxiety disorder, unspecified: Secondary | ICD-10-CM | POA: Diagnosis present

## 2022-06-09 DIAGNOSIS — E7800 Pure hypercholesterolemia, unspecified: Secondary | ICD-10-CM | POA: Diagnosis present

## 2022-06-09 DIAGNOSIS — J9811 Atelectasis: Secondary | ICD-10-CM | POA: Diagnosis not present

## 2022-06-09 DIAGNOSIS — Z79899 Other long term (current) drug therapy: Secondary | ICD-10-CM | POA: Diagnosis not present

## 2022-06-09 DIAGNOSIS — R531 Weakness: Secondary | ICD-10-CM | POA: Diagnosis not present

## 2022-06-09 DIAGNOSIS — N179 Acute kidney failure, unspecified: Secondary | ICD-10-CM | POA: Diagnosis not present

## 2022-06-09 DIAGNOSIS — Z880 Allergy status to penicillin: Secondary | ICD-10-CM | POA: Diagnosis not present

## 2022-06-09 DIAGNOSIS — I4819 Other persistent atrial fibrillation: Secondary | ICD-10-CM | POA: Diagnosis present

## 2022-06-09 DIAGNOSIS — R0602 Shortness of breath: Secondary | ICD-10-CM | POA: Diagnosis present

## 2022-06-09 DIAGNOSIS — Z833 Family history of diabetes mellitus: Secondary | ICD-10-CM | POA: Diagnosis not present

## 2022-06-09 DIAGNOSIS — Z7901 Long term (current) use of anticoagulants: Secondary | ICD-10-CM | POA: Diagnosis not present

## 2022-06-09 DIAGNOSIS — Z7401 Bed confinement status: Secondary | ICD-10-CM | POA: Diagnosis not present

## 2022-06-09 DIAGNOSIS — I4891 Unspecified atrial fibrillation: Secondary | ICD-10-CM | POA: Diagnosis not present

## 2022-06-09 DIAGNOSIS — Z86006 Personal history of melanoma in-situ: Secondary | ICD-10-CM | POA: Diagnosis not present

## 2022-06-09 DIAGNOSIS — I5033 Acute on chronic diastolic (congestive) heart failure: Secondary | ICD-10-CM | POA: Diagnosis present

## 2022-06-09 DIAGNOSIS — R5381 Other malaise: Secondary | ICD-10-CM | POA: Diagnosis not present

## 2022-06-09 DIAGNOSIS — I110 Hypertensive heart disease with heart failure: Secondary | ICD-10-CM | POA: Diagnosis not present

## 2022-06-09 LAB — ECHOCARDIOGRAM COMPLETE: S' Lateral: 3.1 cm

## 2022-06-11 ENCOUNTER — Emergency Department (HOSPITAL_COMMUNITY): Payer: Medicare PPO

## 2022-06-11 ENCOUNTER — Other Ambulatory Visit: Payer: Self-pay

## 2022-06-11 ENCOUNTER — Inpatient Hospital Stay (HOSPITAL_COMMUNITY)
Admission: EM | Admit: 2022-06-11 | Discharge: 2022-06-24 | DRG: 291 | Disposition: A | Discharge status: Skilled Nursing Facility | Payer: Medicare PPO | Attending: Internal Medicine | Admitting: Internal Medicine

## 2022-06-11 ENCOUNTER — Encounter (HOSPITAL_COMMUNITY): Payer: Self-pay

## 2022-06-11 DIAGNOSIS — N179 Acute kidney failure, unspecified: Secondary | ICD-10-CM | POA: Diagnosis not present

## 2022-06-11 DIAGNOSIS — T502X5A Adverse effect of carbonic-anhydrase inhibitors, benzothiadiazides and other diuretics, initial encounter: Secondary | ICD-10-CM | POA: Diagnosis not present

## 2022-06-11 DIAGNOSIS — I5033 Acute on chronic diastolic (congestive) heart failure: Secondary | ICD-10-CM | POA: Diagnosis not present

## 2022-06-11 DIAGNOSIS — M8580 Other specified disorders of bone density and structure, unspecified site: Secondary | ICD-10-CM | POA: Diagnosis present

## 2022-06-11 DIAGNOSIS — J9621 Acute and chronic respiratory failure with hypoxia: Secondary | ICD-10-CM | POA: Diagnosis present

## 2022-06-11 DIAGNOSIS — I130 Hypertensive heart and chronic kidney disease with heart failure and stage 1 through stage 4 chronic kidney disease, or unspecified chronic kidney disease: Principal | ICD-10-CM | POA: Diagnosis present

## 2022-06-11 DIAGNOSIS — I509 Heart failure, unspecified: Secondary | ICD-10-CM

## 2022-06-11 DIAGNOSIS — I4819 Other persistent atrial fibrillation: Secondary | ICD-10-CM | POA: Diagnosis present

## 2022-06-11 DIAGNOSIS — Z8249 Family history of ischemic heart disease and other diseases of the circulatory system: Secondary | ICD-10-CM

## 2022-06-11 DIAGNOSIS — Z79899 Other long term (current) drug therapy: Secondary | ICD-10-CM

## 2022-06-11 DIAGNOSIS — Z7901 Long term (current) use of anticoagulants: Secondary | ICD-10-CM

## 2022-06-11 DIAGNOSIS — E876 Hypokalemia: Secondary | ICD-10-CM | POA: Diagnosis not present

## 2022-06-11 DIAGNOSIS — Z881 Allergy status to other antibiotic agents status: Secondary | ICD-10-CM

## 2022-06-11 DIAGNOSIS — N182 Chronic kidney disease, stage 2 (mild): Secondary | ICD-10-CM | POA: Diagnosis present

## 2022-06-11 DIAGNOSIS — I480 Paroxysmal atrial fibrillation: Secondary | ICD-10-CM | POA: Diagnosis present

## 2022-06-11 DIAGNOSIS — R0602 Shortness of breath: Principal | ICD-10-CM

## 2022-06-11 DIAGNOSIS — L03115 Cellulitis of right lower limb: Secondary | ICD-10-CM | POA: Diagnosis not present

## 2022-06-11 DIAGNOSIS — Z833 Family history of diabetes mellitus: Secondary | ICD-10-CM

## 2022-06-11 DIAGNOSIS — Z751 Person awaiting admission to adequate facility elsewhere: Secondary | ICD-10-CM

## 2022-06-11 DIAGNOSIS — R5381 Other malaise: Secondary | ICD-10-CM

## 2022-06-11 DIAGNOSIS — F419 Anxiety disorder, unspecified: Secondary | ICD-10-CM | POA: Diagnosis present

## 2022-06-11 DIAGNOSIS — I10 Essential (primary) hypertension: Secondary | ICD-10-CM | POA: Diagnosis present

## 2022-06-11 DIAGNOSIS — M170 Bilateral primary osteoarthritis of knee: Secondary | ICD-10-CM | POA: Diagnosis present

## 2022-06-11 DIAGNOSIS — E7800 Pure hypercholesterolemia, unspecified: Secondary | ICD-10-CM | POA: Diagnosis present

## 2022-06-11 DIAGNOSIS — I959 Hypotension, unspecified: Secondary | ICD-10-CM | POA: Diagnosis not present

## 2022-06-11 DIAGNOSIS — N1832 Chronic kidney disease, stage 3b: Secondary | ICD-10-CM | POA: Diagnosis present

## 2022-06-11 DIAGNOSIS — Z880 Allergy status to penicillin: Secondary | ICD-10-CM

## 2022-06-11 DIAGNOSIS — Z86006 Personal history of melanoma in-situ: Secondary | ICD-10-CM

## 2022-06-11 LAB — CBC WITH DIFFERENTIAL/PLATELET
Abs Immature Granulocytes: 0.02 10*3/uL (ref 0.00–0.07)
Basophils Absolute: 0 10*3/uL (ref 0.0–0.1)
Basophils Relative: 0 %
Eosinophils Absolute: 0.1 10*3/uL (ref 0.0–0.5)
Eosinophils Relative: 2 %
HCT: 39.1 % (ref 36.0–46.0)
Hemoglobin: 11.9 g/dL — ABNORMAL LOW (ref 12.0–15.0)
Immature Granulocytes: 0 %
Lymphocytes Relative: 14 %
Lymphs Abs: 1.2 10*3/uL (ref 0.7–4.0)
MCH: 27.9 pg (ref 26.0–34.0)
MCHC: 30.4 g/dL (ref 30.0–36.0)
MCV: 91.8 fL (ref 80.0–100.0)
Monocytes Absolute: 0.7 10*3/uL (ref 0.1–1.0)
Monocytes Relative: 9 %
Neutro Abs: 6.1 10*3/uL (ref 1.7–7.7)
Neutrophils Relative %: 75 %
Platelets: 189 10*3/uL (ref 150–400)
RBC: 4.26 MIL/uL (ref 3.87–5.11)
RDW: 14.3 % (ref 11.5–15.5)
WBC: 8.1 10*3/uL (ref 4.0–10.5)
nRBC: 0 % (ref 0.0–0.2)

## 2022-06-11 LAB — COMPREHENSIVE METABOLIC PANEL
ALT: 30 U/L (ref 0–44)
AST: 33 U/L (ref 15–41)
Albumin: 3.5 g/dL (ref 3.5–5.0)
Alkaline Phosphatase: 84 U/L (ref 38–126)
Anion gap: 10 (ref 5–15)
BUN: 27 mg/dL — ABNORMAL HIGH (ref 8–23)
CO2: 21 mmol/L — ABNORMAL LOW (ref 22–32)
Calcium: 8.8 mg/dL — ABNORMAL LOW (ref 8.9–10.3)
Chloride: 110 mmol/L (ref 98–111)
Creatinine, Ser: 0.94 mg/dL (ref 0.44–1.00)
GFR, Estimated: 59 mL/min — ABNORMAL LOW (ref >60)
Glucose, Bld: 120 mg/dL — ABNORMAL HIGH (ref 70–99)
Potassium: 3.9 mmol/L (ref 3.5–5.1)
Sodium: 141 mmol/L (ref 135–145)
Total Bilirubin: 0.8 mg/dL (ref 0.3–1.2)
Total Protein: 6.5 g/dL (ref 6.5–8.1)

## 2022-06-11 LAB — BRAIN NATRIURETIC PEPTIDE: B Natriuretic Peptide: 1132.1 pg/mL — ABNORMAL HIGH (ref 0.0–100.0)

## 2022-06-11 LAB — TROPONIN I (HIGH SENSITIVITY)
Troponin I (High Sensitivity): 8 ng/L (ref <18)
Troponin I (High Sensitivity): 8 ng/L (ref <18)

## 2022-06-11 MED ORDER — ONDANSETRON HCL 4 MG/2ML IJ SOLN: 4.0000 mg | Freq: Four times a day (QID) | INTRAMUSCULAR | Status: DC | PRN

## 2022-06-11 MED ORDER — METHYLPREDNISOLONE SODIUM SUCC 125 MG IJ SOLR
125.0000 mg | Freq: Once | INTRAMUSCULAR | Status: AC
  Administered 2022-06-11: 125 mg via INTRAVENOUS
  Filled 2022-06-11: qty 2

## 2022-06-11 MED ORDER — APIXABAN 5 MG PO TABS
5.0000 mg | ORAL_TABLET | Freq: Two times a day (BID) | ORAL | Status: DC
  Administered 2022-06-11 – 2022-06-24 (×26): 5 mg via ORAL
  Filled 2022-06-11 (×26): qty 1

## 2022-06-11 MED ORDER — POTASSIUM CHLORIDE 20 MEQ PO PACK
20.0000 meq | PACK | Freq: Two times a day (BID) | ORAL | Status: DC
  Administered 2022-06-11 – 2022-06-20 (×17): 20 meq via ORAL
  Filled 2022-06-11 (×17): qty 1

## 2022-06-11 MED ORDER — MUSCLE RUB 10-15 % EX CREA
TOPICAL_CREAM | CUTANEOUS | Status: DC | PRN
  Administered 2022-06-12: 1 via TOPICAL
  Filled 2022-06-11: qty 85

## 2022-06-11 MED ORDER — ALBUTEROL SULFATE (2.5 MG/3ML) 0.083% IN NEBU
2.5000 mg | INHALATION_SOLUTION | RESPIRATORY_TRACT | Status: DC | PRN
  Administered 2022-06-11 – 2022-06-17 (×8): 2.5 mg via RESPIRATORY_TRACT
  Filled 2022-06-11 (×8): qty 3

## 2022-06-11 MED ORDER — ACETAMINOPHEN 650 MG RE SUPP: 650.0000 mg | Freq: Four times a day (QID) | RECTAL | Status: DC | PRN

## 2022-06-11 MED ORDER — ONDANSETRON HCL 4 MG PO TABS: 4.0000 mg | ORAL_TABLET | Freq: Four times a day (QID) | ORAL | Status: DC | PRN

## 2022-06-11 MED ORDER — ATORVASTATIN CALCIUM 40 MG PO TABS
40.0000 mg | ORAL_TABLET | Freq: Every day | ORAL | Status: DC
  Administered 2022-06-12 – 2022-06-24 (×12): 40 mg via ORAL
  Filled 2022-06-11 (×12): qty 1

## 2022-06-11 MED ORDER — FUROSEMIDE 10 MG/ML IJ SOLN
40.0000 mg | Freq: Once | INTRAMUSCULAR | Status: AC
  Administered 2022-06-11: 40 mg via INTRAVENOUS
  Filled 2022-06-11: qty 4

## 2022-06-11 MED ORDER — ACETAMINOPHEN 325 MG PO TABS
650.0000 mg | ORAL_TABLET | Freq: Four times a day (QID) | ORAL | Status: DC | PRN
  Administered 2022-06-11 – 2022-06-23 (×11): 650 mg via ORAL
  Filled 2022-06-11 (×12): qty 2

## 2022-06-11 MED ORDER — METOPROLOL TARTRATE 25 MG PO TABS
100.0000 mg | ORAL_TABLET | Freq: Once | ORAL | Status: AC
  Administered 2022-06-11: 100 mg via ORAL
  Filled 2022-06-11: qty 4

## 2022-06-11 MED ORDER — HYDRALAZINE HCL 20 MG/ML IJ SOLN: 5.0000 mg | Freq: Four times a day (QID) | INTRAMUSCULAR | Status: DC | PRN

## 2022-06-11 MED ORDER — METOPROLOL TARTRATE 50 MG PO TABS
100.0000 mg | ORAL_TABLET | Freq: Two times a day (BID) | ORAL | Status: DC
  Administered 2022-06-11 – 2022-06-17 (×13): 100 mg via ORAL
  Filled 2022-06-11 (×5): qty 4
  Filled 2022-06-11: qty 2
  Filled 2022-06-11 (×7): qty 4

## 2022-06-11 MED ORDER — TRAZODONE HCL 50 MG PO TABS
25.0000 mg | ORAL_TABLET | Freq: Every evening | ORAL | Status: DC | PRN
  Administered 2022-06-11 – 2022-06-23 (×11): 25 mg via ORAL
  Filled 2022-06-11 (×11): qty 1

## 2022-06-11 MED ORDER — FUROSEMIDE 10 MG/ML IJ SOLN
20.0000 mg | Freq: Two times a day (BID) | INTRAMUSCULAR | Status: DC
  Administered 2022-06-11 – 2022-06-14 (×6): 20 mg via INTRAVENOUS
  Filled 2022-06-11 (×6): qty 2

## 2022-06-11 MED ORDER — IPRATROPIUM-ALBUTEROL 0.5-2.5 (3) MG/3ML IN SOLN
3.0000 mL | Freq: Once | RESPIRATORY_TRACT | Status: AC
  Administered 2022-06-11: 3 mL via RESPIRATORY_TRACT
  Filled 2022-06-11: qty 3

## 2022-06-11 NOTE — ED Provider Notes (Signed)
[place] EMERGENCY DEPARTMENT AT [facility] Provider Note   CSN: [id number] Arrival date & time: 06/11/22  7829     History  Chief Complaint  Patient presents with   Shortness of Breath    Brenda Matthews is a 87 y.o. female.  HPI   87 year old female with past medical history of atrial fibrillation, HTN, HLD, CHF, CKD presents to the emergency department shortness of breath.  Patient states that this has been ongoing for the past week, worsening yesterday.  Not relieved with her home albuterol treatments.  Patient does not wear oxygen at home.  She cannot quite characterize the feeling of shortness of breath but states that she cannot take a full deep breath and feels fatigued.  Admits to increased weight gain/swelling of her lower extremities.  States that she has been compliant with her medications including Lasix and anticoagulation.  Denies any fever.  No vomiting/diarrhea.  Home Medications Prior to Admission medications   Medication Sig Start Date End Date Taking? Authorizing Provider  acetaminophen (TYLENOL) 650 MG CR tablet Take 650 mg by mouth in the morning and at bedtime.    [provider]  albuterol (VENTOLIN HFA) 108 (90 Base) MCG/ACT inhaler INHALE 2 PUFFS BY MOUTH into THE lungs EVERY 4 HOURS AS NEEDED FOR WHEEZING OR SHORTNESS OF BREATH 05/02/22   McGowen, Maryjean Morn, MD  ALPRAZolam Prudy Feeler) 0.5 MG tablet TAKE 1 TABLET BY MOUTH 3 TIMES DAILY AS NEEDED FOR ANXIETY 09/21/21   McGowen, Maryjean Morn, MD  amiodarone (PACERONE) 200 MG tablet Take 1 tablet by mouth daily. 09/28/18   [provider]  amLODipine (NORVASC) 10 MG tablet TAKE 1 TABLET BY MOUTH EVERY DAY 01/11/22   McGowen, Maryjean Morn, MD  apixaban (ELIQUIS) 5 MG TABS tablet Take 1 tablet (5 mg total) by mouth 2 (two) times daily. 04/07/22   Flossie Dibble, NP  Apoaequorin (PREVAGEN PO) Take 1 tablet by mouth daily.    [provider]  Ascorbic Acid (VITAMIN C PO) Take by mouth daily.     [provider]  atorvastatin (LIPITOR) 40 MG tablet TAKE 1 TABLET BY MOUTH EVERY DAY 01/11/22   McGowen, Maryjean Morn, MD  Biotin 5000 MCG TABS Take 5,000 mcg by mouth daily.    [provider]  Calcium Carbonate (CALCIUM 600 PO) Take 1,200 mg by mouth daily.     [provider]  carboxymethylcellulose (REFRESH PLUS) 0.5 % SOLN Place 2 drops into both eyes daily as needed (dry/irritated eyes.).    [provider]  Coenzyme Q10 200 MG capsule Take 200 mg by mouth daily.    [provider]  Cranberry POWD Take 2 tablets by mouth daily. 09/28/18   [provider]  fluticasone (FLONASE) 50 MCG/ACT nasal spray Place 2 sprays into both nostrils daily.     [provider]  furosemide (LASIX) 40 MG tablet TAKE 1 TABLET BY MOUTH EVERY DAY 01/11/22   McGowen, Maryjean Morn, MD  hydrALAZINE (APRESOLINE) 50 MG tablet TAKE 1 TABLET BY MOUTH 3 TIMES DAILY 01/11/22   McGowen, Maryjean Morn, MD  metoprolol tartrate (LOPRESSOR) 100 MG tablet Take 1 tablet (100 mg total) by mouth 2 (two) times daily. 01/07/22   Hilty, Lisette Abu, MD  Multiple Vitamins-Minerals (CENTRUM SILVER ULTRA WOMENS) TABS Take 1 tablet by mouth every evening.    [provider]  potassium chloride SA (KLOR-CON M) 20 MEQ tablet TAKE 1 TABLET BY MOUTH 2 TIMES DAILY 10/21/21  Chrystie Nose, MD      Allergies    Augmentin [amoxicillin-pot clavulanate], Amoxicillin, and Clindamycin/lincomycin    Review of Systems   Review of Systems  Constitutional:  Positive for fatigue. Negative for fever.  Respiratory:  Positive for cough, chest tightness and shortness of breath.   Cardiovascular:  Positive for leg swelling. Negative for chest pain and palpitations.  Gastrointestinal:  Negative for abdominal pain, diarrhea and vomiting.  Genitourinary:  Negative for flank pain.  Musculoskeletal:  Negative for back pain.  Skin:  Negative for rash.  Neurological:  Negative for headaches.     Physical Exam Updated Vital Signs BP 127/83   Pulse (!) 112   Temp 97.6 F (36.4 C) (Oral)   Resp (!) 29   Ht 5\' 1"  (1.549 m)   Wt 84.1 kg   LMP  (LMP Unknown)   SpO2 95%   BMI 35.03 kg/m  Physical Exam Vitals and nursing note reviewed.  Constitutional:      General: She is not in acute distress.    Appearance: Normal appearance. She is ill-appearing.  HENT:     Head: Normocephalic.     Mouth/Throat:     Mouth: Mucous membranes are moist.  Cardiovascular:     Rate and Rhythm: Tachycardia present. Rhythm irregular.  Pulmonary:     Effort: Pulmonary effort is normal. Tachypnea present. No respiratory distress.     Breath sounds: Examination of the right-lower field reveals decreased breath sounds. Examination of the left-lower field reveals decreased breath sounds. Decreased breath sounds, rhonchi and rales present.  Abdominal:     Palpations: Abdomen is soft.     Tenderness: There is no abdominal tenderness.  Musculoskeletal:     Right lower leg: Edema present.     Left lower leg: Edema present.  Skin:    General: Skin is warm.  Neurological:     Mental Status: She is alert and oriented to person, place, and time. Mental status is at baseline.  Psychiatric:        Mood and Affect: Mood normal.     ED Results / Procedures / Treatments   Labs (all labs ordered are listed, but only abnormal results are displayed) Labs Reviewed  CBC WITH DIFFERENTIAL/PLATELET  COMPREHENSIVE METABOLIC PANEL  BRAIN NATRIURETIC PEPTIDE  TROPONIN I (HIGH SENSITIVITY)    EKG None  Radiology DG Chest Port 1 View  Result Date: 06/11/2022 CLINICAL DATA:  Shortness of breath. EXAM: PORTABLE CHEST 1 VIEW COMPARISON:  09/18/2020 FINDINGS: The cardio pericardial silhouette is enlarged. Bibasilar collapse/consolidation, left greater than right with probable small left effusion. Interstitial markings are diffusely coarsened with chronic features. Bones are diffusely demineralized.  Telemetry leads overlie the chest. IMPRESSION: Bibasilar collapse/consolidation, left greater than right with probable small left effusion. Electronically Signed   By: Kennith Center M.D.   On: 06/11/2022 09:16   ECHOCARDIOGRAM COMPLETE  Result Date: 06/09/2022    ECHOCARDIOGRAM REPORT   Patient Name:   Brenda Matthews  Date of Exam: 06/09/2022 Medical Rec #:  [id number]     Height:       61.5 in Accession #:    [id number]    Weight:       185.4 lb Date of Birth:  January 07, 1936     BSA:          1.840 m Patient Age:    87 years      BP:           132/64 mmHg  Patient Gender: F             HR:           95 bpm. Exam Location:  [address] Procedure: 2D Echo, Cardiac Doppler and Color Doppler Indications:    R06.02 SOB  History:        Patient has prior history of Echocardiogram examinations, most                 recent 08/10/2017. CHF, Abnormal ECG, Arrythmias:Atrial                 Fibrillation; Risk Factors:Hypertension and Dyslipidemia. Lower                 extremity edema. Dyspnea on exertion. Chronic kidney disease.  Sonographer:    Cathie Beams RCS Referring Phys: [phone] JENNIFER C WOODY IMPRESSIONS  1. Left ventricular ejection fraction, by estimation, is 50 to 55%. The left ventricle has low normal function. Left ventricular endocardial border not optimally defined to evaluate regional wall motion. There is mild concentric left ventricular hypertrophy. Left ventricular diastolic parameters are indeterminate.  2. Right ventricular systolic function is normal. The right ventricular size is normal.  3. Left atrial size was moderately dilated.  4. Right atrial size was mildly dilated.  5. The mitral valve is grossly normal. Mild to moderate mitral valve regurgitation.  6. The aortic valve is tricuspid. There is mild calcification of the aortic valve. There is mild thickening of the aortic valve. Aortic valve regurgitation is mild. Aortic valve sclerosis/calcification is present, without any evidence of aortic  stenosis.  7. Aortic dilatation noted. There is mild dilatation of the ascending aorta, measuring 41 mm. Comparison(s): No significant change from prior study. FINDINGS  Left Ventricle: Left ventricular ejection fraction, by estimation, is 50 to 55%. The left ventricle has low normal function. Left ventricular endocardial border not optimally defined to evaluate regional wall motion. The left ventricular internal cavity  size was normal in size. There is mild concentric left ventricular hypertrophy. Left ventricular diastolic parameters are indeterminate. Right Ventricle: The right ventricular size is normal. No increase in right ventricular wall thickness. Right ventricular systolic function is normal. Left Atrium: Left atrial size was moderately dilated. Right Atrium: Right atrial size was mildly dilated. Pericardium: There is no evidence of pericardial effusion. Mitral Valve: The mitral valve is grossly normal. Mild to moderate mitral valve regurgitation. Tricuspid Valve: The tricuspid valve is normal in structure. Tricuspid valve regurgitation is mild. Aortic Valve: The aortic valve is tricuspid. There is mild calcification of the aortic valve. There is mild thickening of the aortic valve. Aortic valve regurgitation is mild. Aortic valve sclerosis/calcification is present, without any evidence of aortic stenosis. Pulmonic Valve: The pulmonic valve was normal in structure. Pulmonic valve regurgitation is trivial. Aorta: Aortic dilatation noted. There is mild dilatation of the ascending aorta, measuring 41 mm. IAS/Shunts: The atrial septum is grossly normal.  LEFT VENTRICLE PLAX 2D LVIDd:         4.50 cm LVIDs:         3.10 cm LV PW:         1.10 cm LV IVS:        1.30 cm LVOT diam:     2.10 cm LV SV:         55 LV SV Index:   30 LVOT Area:     3.46 cm  RIGHT VENTRICLE RV Basal diam:  3.00 cm RV S prime:  8.05 cm/s TAPSE (M-mode): 1.5 cm RVSP:           32.5 mmHg LEFT ATRIUM             Index        RIGHT  ATRIUM            Index LA diam:        4.00 cm 2.17 cm/m   RA Pressure: 15.00 mmHg LA Vol (A2C):   59.5 ml 32.34 ml/m  RA Area:     19.60 cm LA Vol (A4C):   76.8 ml 41.74 ml/m  RA Volume:   56.80 ml   30.87 ml/m LA Biplane Vol: 68.5 ml 37.23 ml/m  AORTIC VALVE LVOT Vmax:   85.43 cm/s LVOT Vmean:  56.667 cm/s LVOT VTI:    0.158 m  AORTA Ao Root diam: 3.30 cm Ao Asc diam:  4.10 cm TRICUSPID VALVE TR Peak grad:   17.5 mmHg TR Vmax:        209.00 cm/s Estimated RAP:  15.00 mmHg RVSP:           32.5 mmHg  SHUNTS Systemic VTI:  0.16 m Systemic Diam: 2.10 cm Laurance Flatten MD Electronically signed by Laurance Flatten MD Signature Date/Time: 06/09/2022/3:34:48 PM    Final     Procedures .Critical Care  Performed by: Rozelle Logan, DO Authorized by: Rozelle Logan, DO   Critical care provider statement:    Critical care time (minutes):  45   Critical care time was exclusive of:  Separately billable procedures and treating other patients   Critical care was necessary to treat or prevent imminent or life-threatening deterioration of the following conditions:  Respiratory failure   Critical care was time spent personally by me on the following activities:  Development of treatment plan with patient or surrogate, discussions with consultants, evaluation of patient's response to treatment, examination of patient, ordering and review of laboratory studies, ordering and review of radiographic studies, ordering and performing treatments and interventions, pulse oximetry, re-evaluation of patient's condition and review of old charts   I assumed direction of critical care for this patient from another provider in my specialty: no     Care discussed with: admitting provider       Medications Ordered in ED Medications  furosemide (LASIX) injection 40 mg (40 mg Intravenous Given 06/11/22 0914)    ED Course/ Medical Decision Making/ A&P                             Medical Decision Making Amount  and/or Complexity of Data Reviewed Labs: ordered. Radiology: ordered.  Risk Prescription drug management. Decision regarding hospitalization.   87 year old female presents emergency department shortness of breath.  Hypoxic on arrival requiring 2 L of nasal cannula.  She is tachypneic and at times tachycardic.  EKG shows atrial fibrillation.  Lung sounds show scattered wheezing with rales.  Blood work with elevated BNP and chest x-ray with CHF findings. Troponin negative. Patient did not take morning meds, morning dose of metoprolol ordered here with improvement of HR control.  After steroids, breathing treatment and 1 round of diuresis patient feels improved.  Lung auscultation has improved.  Attempted to wean her off the nasal cannula she became hypoxic to the high 80s again at rest.  Patient will require admission for further evaluation and treatment.  Patients evaluation and results requires admission for further treatment and care.  Spoke with hospitalist,  reviewed patient's ED course and they accept admission.  Patient agrees with admission plan, offers no new complaints and is stable/unchanged at time of admit.        Final Clinical Impression(s) / ED Diagnoses Final diagnoses:  None    Rx / DC Orders ED Discharge Orders     None         Rozelle Logan, DO 06/11/22 1252

## 2022-06-11 NOTE — H&P (Signed)
History and Physical  [name] ZOX:[id number] DOB: [birth year] DOA: 06/11/2022  PCP: Brenda Massed, MD   Chief Complaint: SOB  HPI: Brenda Matthews is a 87 y.o. female with medical history significant for CKD stage II, chronic diastolic congestive heart failure with preserved EF, hypertension, hyperlipidemia being admitted to the hospital with shortness of breath as a result of acute on chronic diastolic congestive heart failure.  Patient states that for the last couple of weeks she has been feeling short of breath, she actually went to the cardiology clinic in April and was prescribed 3 days of increased Lasix.  These notes were reviewed by this Clinical research associate.  Patient states that when she went to see cardiology, she also had some peripheral lower extremity edema and orthopnea.  These actually improved after 3 days of increased Lasix.  However over the last 2 to 3 days she noticed that her swelling in her legs as well as her shortness of breath especially with exertion seem to come back.  She lives in independent living, EMS was called due to shortness of breath.  EMS reports initial O2 at 84% on room air, was placed on 2 L.  In the emergency department, she was found to be in A-fib, with heart rate in the 80s to 90s.  She was saturating 99% on room air after she was given some IV Lasix, and breathing treatments.  Patient's breathing is feeling much better, but she is being admitted to the hospital because she still has some volume overload, and intermittently requiring oxygen, desaturating into the high 80s when on room air, even at rest.  Review of Systems: Please see HPI for pertinent positives and negatives. A complete 10 system review of systems are otherwise negative.  She specifically denies chest pain, cough, nausea, orthopnea, abdominal fullness.  Endorses some lower extremity edema.  Past Medical History:  Diagnosis Date   Abnormal EKG approx 2008   Nuclear stress test neg;    Acute  pancreatitis 09/2020   idiopathic.  +panc psudocyst   Anxiety    with panic   CAP (community acquired pneumonia) 08/2017   Hospitalization for CAP/acute diast HF/rapid a-fib   Cataract    s/p surgery--lens implants   Chronic diastolic heart failure (HCC) 2019   Chronic renal insufficiency, stage 3 (moderate) (HCC) 12/04/2012   Renal u/s when in hosp 08/2017 for CAP/CHF showed symmetric kidneys, echogenicity normal, w/out hydronephrosis. Baseline GFR around 40 ml/min as of 10/2018.   DDD (degenerative disc disease), lumbar    Diverticulosis 2009   Fracture of radial shaft, left, closed 11/16/2010   fell down flight of stairs   History of kidney stones    Hx of adenomatous colonic polyps 2002;2009;2015   surveillance colonoscopy 2009, +polypectomy done-tubular adenoma w/out high grade.  05/2013 tubular adenomas--recall 3 yrs   Hyperlipidemia    Hypertension    Low TSH level 02/18/2016   T3 norm, T4 mildly elevated--suspected sick euthyroid syndrome.  Repeat labs 06/2016: normal.   Melanoma in situ (HCC) 06/2018   L LL   Osteoarthritis of both knees    viscosupplementation injections helpful 2020/21   Osteopenia    DEXA 08/2010; repeat DEXA 02/2015 worse: fosamax started.  06/2018 Dexa T score -2.4.  2020 maj osteop fx risk = 24%, Hip fx risk 7.3%. 09/2020 T score -2.1   PAF (paroxysmal atrial fibrillation) (HCC) 02/2016   when in post-op for ankle surgery; spontaneously converted in hosp, seen by Dr. Rennis Matthews in consultation--metoprolol  rate control + xarelto recommended.  Metop d/c due to hypot.  Plan to cont xarelto 20 mg qd indef due to CHAD-VASc score of 3.  A-fib w/RVR and CHF 07/2017; pt placed on amiodarone and plan for CV, but pt was in sinus rhythm when she went in for her DC CV, so she was sent home.   Peripheral edema    Pneumonia 2015   hx with sepsis   Rheumatic fever    Past Surgical History:  Procedure Laterality Date   APPENDECTOMY  1966   done during surgery for tubal  pregnancy   CATARACT EXTRACTION W/ INTRAOCULAR LENS IMPLANT  2013   bilat   COLONOSCOPY W/ POLYPECTOMY  05/2013   +diverticulosis; recall 3 yrs (Dr. Marina Matthews)   DEXA  02/2015; 06/2018   T score -2.1 in both femoral necks; FRAX 10 yr risk of major osteoporotic fracture was 21%---fosamax started. 06/2018 T score -2.4.  T score 09/2020 -2.1. Rpt 2 yrs.   ECTOPIC PREGNANCY SURGERY     EYE SURGERY     LEFT HEART CATH AND CORONARY ANGIOGRAPHY N/A 09/01/2017   No angiographically significant CAD.  Upper normal left ventricular filling pressure.  Procedure: LEFT HEART CATH AND CORONARY ANGIOGRAPHY;  Surgeon: Brenda Kendall, MD;  Location: [facility];  Service: Cardiovascular;  Laterality: N/A;   LUMBAR LAMINECTOMY/DECOMPRESSION MICRODISCECTOMY N/A 09/26/2018   Procedure: Decompressive Lumbar Laminectomy L5 S1 FORAMINOTOMY L5 S1  NERVE ROOT BILATERALLY and Microdiscectomy L5-S1 Left;  Surgeon: Brenda Gosselin, MD;  Location: [facility];  Service: Orthopedics;  Laterality: N/A;    OPEN REDUCTION INTERNAL FIXATION (ORIF) TIBIA/FIBULA FRACTURE Left 02/18/2016   Procedure: OPEN REDUCTION INTERNAL FIXATION (ORIF) Right ankle trimalleolar fracture;  Surgeon: Brenda Arthurs, MD;  Location: [facility] OR;  Service: Orthopedics;  Laterality: Left;  requests   ORIF RADIAL FRACTURE  11/18/2010   left; s/p slip on slippery floor and fell   THORACENTESIS  08/2017   diagnostic and therapeutic.  Transudative.  Clx neg.  (+pulm edema/diastolic HF)   TONSILLECTOMY     TRANSTHORACIC ECHOCARDIOGRAM  02/18/2016; 08/10/17   LVEF of 55-60%, mild AI and mild MR and normal biatrial size.  07/2017--normal LV function, mild enlarge aortic root, mild/mod TR, bilat atrial enlargement.    Social History:  reports that she has never smoked. She has never used smokeless tobacco. She reports current alcohol use. She reports that she does not use drugs.   Allergies  Allergen Reactions   Augmentin [Amoxicillin-Pot Clavulanate]  Nausea And Vomiting and Other (See Comments)    "projectile vomiting" Has patient had a PCN reaction causing immediate rash, facial/tongue/throat swelling, SOB or lightheadedness with hypotension:No Has patient had a PCN reaction causing severe rash involving mucus membranes or skin necrosis:No Has patient had a PCN reaction that required hospitalization:No Has patient had a PCN reaction occurring within the last 10 years:Yes If all of the above answers are "NO", then may proceed with Cephalosporin use.    Amoxicillin Rash   Clindamycin/Lincomycin Rash    Family History  Problem Relation Age of Onset   Heart disease Mother    Heart disease Father    Hypertension Brother    Diabetes Sister    Colon cancer Neg Hx    Pancreatic cancer Neg Hx    Rectal cancer Neg Hx    Stomach cancer Neg Hx      Prior to Admission medications   Medication Sig Start Date End Date Taking? Authorizing Provider  acetaminophen (TYLENOL)  650 MG CR tablet Take 650 mg by mouth in the morning and at bedtime.    [provider]  albuterol (VENTOLIN HFA) 108 (90 Base) MCG/ACT inhaler INHALE 2 PUFFS BY MOUTH into THE lungs EVERY 4 HOURS AS NEEDED FOR WHEEZING OR SHORTNESS OF BREATH 05/02/22   McGowen, Maryjean Morn, MD  ALPRAZolam Prudy Feeler) 0.5 MG tablet TAKE 1 TABLET BY MOUTH 3 TIMES DAILY AS NEEDED FOR ANXIETY 09/21/21   McGowen, Maryjean Morn, MD  amiodarone (PACERONE) 200 MG tablet Take 1 tablet by mouth daily. 09/28/18   [provider]  amLODipine (NORVASC) 10 MG tablet TAKE 1 TABLET BY MOUTH EVERY DAY 01/11/22   McGowen, Maryjean Morn, MD  apixaban (ELIQUIS) 5 MG TABS tablet Take 1 tablet (5 mg total) by mouth 2 (two) times daily. 04/07/22   Flossie Dibble, NP  Apoaequorin (PREVAGEN PO) Take 1 tablet by mouth daily.    [provider]  Ascorbic Acid (VITAMIN C PO) Take by mouth daily.    [provider]  atorvastatin (LIPITOR) 40 MG tablet TAKE 1 TABLET BY MOUTH EVERY DAY 01/11/22   McGowen,  Maryjean Morn, MD  Biotin 5000 MCG TABS Take 5,000 mcg by mouth daily.    [provider]  Calcium Carbonate (CALCIUM 600 PO) Take 1,200 mg by mouth daily.     [provider]  carboxymethylcellulose (REFRESH PLUS) 0.5 % SOLN Place 2 drops into both eyes daily as needed (dry/irritated eyes.).    [provider]  Coenzyme Q10 200 MG capsule Take 200 mg by mouth daily.    [provider]  Cranberry POWD Take 2 tablets by mouth daily. 09/28/18   [provider]  fluticasone (FLONASE) 50 MCG/ACT nasal spray Place 2 sprays into both nostrils daily.     [provider]  furosemide (LASIX) 40 MG tablet TAKE 1 TABLET BY MOUTH EVERY DAY 01/11/22   McGowen, Maryjean Morn, MD  hydrALAZINE (APRESOLINE) 50 MG tablet TAKE 1 TABLET BY MOUTH 3 TIMES DAILY 01/11/22   McGowen, Maryjean Morn, MD  metoprolol tartrate (LOPRESSOR) 100 MG tablet Take 1 tablet (100 mg total) by mouth 2 (two) times daily. 01/07/22   Hilty, Lisette Abu, MD  Multiple Vitamins-Minerals (CENTRUM SILVER ULTRA WOMENS) TABS Take 1 tablet by mouth every evening.    [provider]  potassium chloride SA (KLOR-CON M) 20 MEQ tablet TAKE 1 TABLET BY MOUTH 2 TIMES DAILY 10/21/21   Hilty, Lisette Abu, MD    Physical Exam: BP 110/77   Pulse 90   Temp 97.6 F (36.4 C) (Oral)   Resp (!) 23   Ht 5\' 1"  (1.549 m)   Wt 84.1 kg   LMP  (LMP Unknown)   SpO2 93%   BMI 35.03 kg/m   General: Elderly female appearing her stated age, pleasant and cooperative, mentally sharp.  Speaking in full sentences, no cough, looks very comfortable in no distress. Eyes: EOMI, clear conjuctivae, white sclerea Neck: supple, no masses, trachea mildline  Cardiovascular: Irregularly irregular, heart rate in the 90s, no murmurs or rubs, no peripheral edema  Respiratory: She is wearing 2 L nasal cannula oxygen, no tachypnea, no evidence of respiratory distress.  No wheezing or rhonchi, breath sounds are diminished at the bilateral  bases, but otherwise clear with good bilateral air entry Abdomen: soft, nontender, nondistended, normal bowel tones heard  Skin: dry, no rashes  Musculoskeletal: no joint effusions, normal range of motion  Psychiatric: appropriate affect, normal speech  Neurologic: extraocular  muscles intact, clear speech, moving all extremities with intact sensorium          Labs on Admission:  Basic Metabolic Panel: Recent Labs  Lab 06/11/22 0916  NA 141  K 3.9  CL 110  CO2 21*  GLUCOSE 120*  BUN 27*  CREATININE 0.94  CALCIUM 8.8*   Liver Function Tests: Recent Labs  Lab 06/11/22 0916  AST 33  ALT 30  ALKPHOS 84  BILITOT 0.8  PROT 6.5  ALBUMIN 3.5   No results for input(s): "LIPASE", "AMYLASE" in the last 168 hours. No results for input(s): "AMMONIA" in the last 168 hours. CBC: Recent Labs  Lab 06/11/22 0916  WBC 8.1  NEUTROABS 6.1  HGB 11.9*  HCT 39.1  MCV 91.8  PLT 189   Cardiac Enzymes: No results for input(s): "CKTOTAL", "CKMB", "CKMBINDEX", "TROPONINI" in the last 168 hours.  BNP (last 3 results) Recent Labs    04/07/22 1216 06/11/22 0916  BNP 156.2* 1,132.1*    ProBNP (last 3 results) No results for input(s): "PROBNP" in the last 8760 hours.  CBG: No results for input(s): "GLUCAP" in the last 168 hours.  Radiological Exams on Admission: DG Chest Port 1 View  Result Date: 06/11/2022 CLINICAL DATA:  Shortness of breath. EXAM: PORTABLE CHEST 1 VIEW COMPARISON:  09/18/2020 FINDINGS: The cardio pericardial silhouette is enlarged. Bibasilar collapse/consolidation, left greater than right with probable small left effusion. Interstitial markings are diffusely coarsened with chronic features. Bones are diffusely demineralized. Telemetry leads overlie the chest. IMPRESSION: Bibasilar collapse/consolidation, left greater than right with probable small left effusion. Electronically Signed   By: Kennith Center M.D.   On: 06/11/2022 09:16   ECHOCARDIOGRAM  COMPLETE  Result Date: 06/09/2022    ECHOCARDIOGRAM REPORT   Patient Name:   Brenda Matthews  Date of Exam: 06/09/2022 Medical Rec #:  [id number]     Height:       61.5 in Accession #:    [id number]    Weight:       185.4 lb Date of Birth:  01/28/1935     BSA:          1.840 m Patient Age:    87 years      BP:           132/64 mmHg Patient Gender: F             HR:           95 bpm. Exam Location:  [address] Procedure: 2D Echo, Cardiac Doppler and Color Doppler Indications:    R06.02 SOB  History:        Patient has prior history of Echocardiogram examinations, most                 recent 08/10/2017. CHF, Abnormal ECG, Arrythmias:Atrial                 Fibrillation; Risk Factors:Hypertension and Dyslipidemia. Lower                 extremity edema. Dyspnea on exertion. Chronic kidney disease.  Sonographer:    Cathie Beams RCS Referring Phys: [id number] JENNIFER C WOODY IMPRESSIONS  1. Left ventricular ejection fraction, by estimation, is 50 to 55%. The left ventricle has low normal function. Left ventricular endocardial border not optimally defined to evaluate regional wall motion. There is mild concentric left ventricular hypertrophy. Left ventricular diastolic parameters are indeterminate.  2. Right ventricular systolic function is normal. The right ventricular size is  normal.  3. Left atrial size was moderately dilated.  4. Right atrial size was mildly dilated.  5. The mitral valve is grossly normal. Mild to moderate mitral valve regurgitation.  6. The aortic valve is tricuspid. There is mild calcification of the aortic valve. There is mild thickening of the aortic valve. Aortic valve regurgitation is mild. Aortic valve sclerosis/calcification is present, without any evidence of aortic stenosis.  7. Aortic dilatation noted. There is mild dilatation of the ascending aorta, measuring 41 mm. Comparison(s): No significant change from prior study. FINDINGS  Left Ventricle: Left ventricular ejection fraction, by  estimation, is 50 to 55%. The left ventricle has low normal function. Left ventricular endocardial border not optimally defined to evaluate regional wall motion. The left ventricular internal cavity  size was normal in size. There is mild concentric left ventricular hypertrophy. Left ventricular diastolic parameters are indeterminate. Right Ventricle: The right ventricular size is normal. No increase in right ventricular wall thickness. Right ventricular systolic function is normal. Left Atrium: Left atrial size was moderately dilated. Right Atrium: Right atrial size was mildly dilated. Pericardium: There is no evidence of pericardial effusion. Mitral Valve: The mitral valve is grossly normal. Mild to moderate mitral valve regurgitation. Tricuspid Valve: The tricuspid valve is normal in structure. Tricuspid valve regurgitation is mild. Aortic Valve: The aortic valve is tricuspid. There is mild calcification of the aortic valve. There is mild thickening of the aortic valve. Aortic valve regurgitation is mild. Aortic valve sclerosis/calcification is present, without any evidence of aortic stenosis. Pulmonic Valve: The pulmonic valve was normal in structure. Pulmonic valve regurgitation is trivial. Aorta: Aortic dilatation noted. There is mild dilatation of the ascending aorta, measuring 41 mm. IAS/Shunts: The atrial septum is grossly normal.  LEFT VENTRICLE PLAX 2D LVIDd:         4.50 cm LVIDs:         3.10 cm LV PW:         1.10 cm LV IVS:        1.30 cm LVOT diam:     2.10 cm LV SV:         55 LV SV Index:   30 LVOT Area:     3.46 cm  RIGHT VENTRICLE RV Basal diam:  3.00 cm RV S prime:     8.05 cm/s TAPSE (M-mode): 1.5 cm RVSP:           32.5 mmHg LEFT ATRIUM             Index        RIGHT ATRIUM            Index LA diam:        4.00 cm 2.17 cm/m   RA Pressure: 15.00 mmHg LA Vol (A2C):   59.5 ml 32.34 ml/m  RA Area:     19.60 cm LA Vol (A4C):   76.8 ml 41.74 ml/m  RA Volume:   56.80 ml   30.87 ml/m LA Biplane  Vol: 68.5 ml 37.23 ml/m  AORTIC VALVE LVOT Vmax:   85.43 cm/s LVOT Vmean:  56.667 cm/s LVOT VTI:    0.158 m  AORTA Ao Root diam: 3.30 cm Ao Asc diam:  4.10 cm TRICUSPID VALVE TR Peak grad:   17.5 mmHg TR Vmax:        209.00 cm/s Estimated RAP:  15.00 mmHg RVSP:           32.5 mmHg  SHUNTS Systemic VTI:  0.16 m Systemic Diam: 2.10  cm Laurance Flatten MD Electronically signed by Laurance Flatten MD Signature Date/Time: 06/09/2022/3:34:48 PM    Final     Echo 06/09/22:      1. Left ventricular ejection fraction, by estimation, is 50 to 55%. The left ventricle has low normal function. Left ventricular endocardial border not optimally defined to evaluate regional wall motion. There is mild concentric left ventricular hypertrophy. Left ventricular diastolic parameters are indeterminate.  2. Right ventricular systolic function is normal. The right ventricular size is normal.  3. Left atrial size was moderately dilated.  4. Right atrial size was mildly dilated.  5. The mitral valve is grossly normal. Mild to moderate mitral valve regurgitation.  6. The aortic valve is tricuspid. There is mild calcification of the aortic valve. There is mild thickening of the aortic valve. Aortic valve regurgitation is mild. Aortic valve sclerosis/calcification is present, without any evidence of aortic stenosis.  7. Aortic dilatation noted. There is mild dilatation of the ascending aorta, measuring 41 mm.    Assessment/Plan This is a pleasant 87 year old female with a history of hypertension, hyperlipidemia, diastolic heart failure admitted to the hospital with acute on chronic diastolic congestive heart failure.  She improved significantly in the ER IV steroids, IV Lasix administration.  She has no known history of oxygen requirement or primary pulmonary disease.  Currently no wheezing, but she still appears fluid overloaded on exam.    Acute on chronic diastolic CHF (congestive heart failure)  (HCC) -Observation admission with telemetry -Continue appropriate cardiac medications as listed below -Recent echo as above -Continue diuresis with IV Lasix 20 mg twice daily, and potassium supplementation -Follow electrolytes and renal function daily while diuresing    Essential hypertension -continue home metoprolol, hold PO hydralazine for now in the setting of diuresis, note low normal BP.    Chronic renal insufficiency, stage II (mild) - currently at baseline, follow with daily labs    PAF (paroxysmal atrial fibrillation) (HCC) - continue home metoprolol and Eliquis    Pure hypercholesterolemia - lipitor  DVT prophylaxis: Lovenox     Code Status: Full Code  Consults called: None  Admission status: Observation  Time spent: 56 minutes  Shreya Lacasse Sharlette Dense MD [facility] Pager [phone]  If 7PM-7AM, please contact night-coverage [email or web address] Password [facility]  06/11/2022, 12:26 PM

## 2022-06-11 NOTE — ED Triage Notes (Signed)
Patient brought in by EMS due to shortness of breath. Pt reports being short of breath for past day. EMS reports initial O2 at 84%. Placed on 2L and O2 came up to 95%. No other treatments given in route.

## 2022-06-12 DIAGNOSIS — I480 Paroxysmal atrial fibrillation: Secondary | ICD-10-CM | POA: Diagnosis not present

## 2022-06-12 DIAGNOSIS — E876 Hypokalemia: Secondary | ICD-10-CM | POA: Diagnosis not present

## 2022-06-12 DIAGNOSIS — E7800 Pure hypercholesterolemia, unspecified: Secondary | ICD-10-CM | POA: Diagnosis present

## 2022-06-12 DIAGNOSIS — L03115 Cellulitis of right lower limb: Secondary | ICD-10-CM | POA: Diagnosis not present

## 2022-06-12 DIAGNOSIS — Z880 Allergy status to penicillin: Secondary | ICD-10-CM | POA: Diagnosis not present

## 2022-06-12 DIAGNOSIS — M170 Bilateral primary osteoarthritis of knee: Secondary | ICD-10-CM | POA: Diagnosis present

## 2022-06-12 DIAGNOSIS — R0602 Shortness of breath: Secondary | ICD-10-CM | POA: Diagnosis present

## 2022-06-12 DIAGNOSIS — Z79899 Other long term (current) drug therapy: Secondary | ICD-10-CM | POA: Diagnosis not present

## 2022-06-12 DIAGNOSIS — Z7901 Long term (current) use of anticoagulants: Secondary | ICD-10-CM | POA: Diagnosis not present

## 2022-06-12 DIAGNOSIS — I10 Essential (primary) hypertension: Secondary | ICD-10-CM | POA: Diagnosis not present

## 2022-06-12 DIAGNOSIS — N1832 Chronic kidney disease, stage 3b: Secondary | ICD-10-CM

## 2022-06-12 DIAGNOSIS — T502X5A Adverse effect of carbonic-anhydrase inhibitors, benzothiadiazides and other diuretics, initial encounter: Secondary | ICD-10-CM | POA: Diagnosis not present

## 2022-06-12 DIAGNOSIS — I4819 Other persistent atrial fibrillation: Secondary | ICD-10-CM | POA: Diagnosis present

## 2022-06-12 DIAGNOSIS — J9621 Acute and chronic respiratory failure with hypoxia: Secondary | ICD-10-CM | POA: Diagnosis present

## 2022-06-12 DIAGNOSIS — Z751 Person awaiting admission to adequate facility elsewhere: Secondary | ICD-10-CM | POA: Diagnosis not present

## 2022-06-12 DIAGNOSIS — Z86006 Personal history of melanoma in-situ: Secondary | ICD-10-CM | POA: Diagnosis not present

## 2022-06-12 DIAGNOSIS — R5381 Other malaise: Secondary | ICD-10-CM | POA: Diagnosis not present

## 2022-06-12 DIAGNOSIS — I509 Heart failure, unspecified: Secondary | ICD-10-CM | POA: Diagnosis not present

## 2022-06-12 DIAGNOSIS — Z833 Family history of diabetes mellitus: Secondary | ICD-10-CM | POA: Diagnosis not present

## 2022-06-12 DIAGNOSIS — I4891 Unspecified atrial fibrillation: Secondary | ICD-10-CM | POA: Diagnosis not present

## 2022-06-12 DIAGNOSIS — I110 Hypertensive heart disease with heart failure: Secondary | ICD-10-CM | POA: Diagnosis not present

## 2022-06-12 DIAGNOSIS — M8580 Other specified disorders of bone density and structure, unspecified site: Secondary | ICD-10-CM | POA: Diagnosis present

## 2022-06-12 DIAGNOSIS — N179 Acute kidney failure, unspecified: Secondary | ICD-10-CM | POA: Diagnosis not present

## 2022-06-12 DIAGNOSIS — I5033 Acute on chronic diastolic (congestive) heart failure: Secondary | ICD-10-CM | POA: Diagnosis present

## 2022-06-12 DIAGNOSIS — F419 Anxiety disorder, unspecified: Secondary | ICD-10-CM | POA: Diagnosis present

## 2022-06-12 DIAGNOSIS — I959 Hypotension, unspecified: Secondary | ICD-10-CM | POA: Diagnosis not present

## 2022-06-12 DIAGNOSIS — Z8249 Family history of ischemic heart disease and other diseases of the circulatory system: Secondary | ICD-10-CM | POA: Diagnosis not present

## 2022-06-12 DIAGNOSIS — I130 Hypertensive heart and chronic kidney disease with heart failure and stage 1 through stage 4 chronic kidney disease, or unspecified chronic kidney disease: Secondary | ICD-10-CM | POA: Diagnosis present

## 2022-06-12 DIAGNOSIS — Z881 Allergy status to other antibiotic agents status: Secondary | ICD-10-CM | POA: Diagnosis not present

## 2022-06-12 LAB — CBC
HCT: 39.2 % (ref 36.0–46.0)
Hemoglobin: 12.1 g/dL (ref 12.0–15.0)
MCH: 28.5 pg (ref 26.0–34.0)
MCHC: 30.9 g/dL (ref 30.0–36.0)
MCV: 92.5 fL (ref 80.0–100.0)
Platelets: 211 10*3/uL (ref 150–400)
RBC: 4.24 MIL/uL (ref 3.87–5.11)
RDW: 14.1 % (ref 11.5–15.5)
WBC: 7.9 10*3/uL (ref 4.0–10.5)
nRBC: 0.4 % — ABNORMAL HIGH (ref 0.0–0.2)

## 2022-06-12 LAB — BASIC METABOLIC PANEL
Anion gap: 9 (ref 5–15)
BUN: 33 mg/dL — ABNORMAL HIGH (ref 8–23)
CO2: 21 mmol/L — ABNORMAL LOW (ref 22–32)
Calcium: 8.9 mg/dL (ref 8.9–10.3)
Chloride: 108 mmol/L (ref 98–111)
Creatinine, Ser: 1.29 mg/dL — ABNORMAL HIGH (ref 0.44–1.00)
GFR, Estimated: 41 mL/min — ABNORMAL LOW (ref >60)
Glucose, Bld: 171 mg/dL — ABNORMAL HIGH (ref 70–99)
Potassium: 4.3 mmol/L (ref 3.5–5.1)
Sodium: 138 mmol/L (ref 135–145)

## 2022-06-12 LAB — BRAIN NATRIURETIC PEPTIDE: B Natriuretic Peptide: 1501.8 pg/mL — ABNORMAL HIGH (ref 0.0–100.0)

## 2022-06-12 MED ORDER — AMIODARONE HCL 200 MG PO TABS
200.0000 mg | ORAL_TABLET | Freq: Every day | ORAL | Status: DC
  Administered 2022-06-12 – 2022-06-14 (×3): 200 mg via ORAL
  Filled 2022-06-12 (×3): qty 1

## 2022-06-12 MED ORDER — ALPRAZOLAM 0.5 MG PO TABS
0.5000 mg | ORAL_TABLET | Freq: Three times a day (TID) | ORAL | Status: DC | PRN
  Administered 2022-06-12 – 2022-06-19 (×12): 0.5 mg via ORAL
  Filled 2022-06-12 (×13): qty 1

## 2022-06-12 NOTE — Assessment & Plan Note (Signed)
-   presented with orthopnea, pulmonary crackles, pulmonary edema, B/L LE edema, SOB/DOE, pleural effusion  - BNP 1,501 - continue lasix 20 mg IV BID; will increase further if necessary - trend BMP while on diuresis - patient has upcoming followup appt with cardiology on 5/29 as well; might need to consult while hospitalized if prolonged  - strict I&O

## 2022-06-12 NOTE — Assessment & Plan Note (Addendum)
-   rate controlled -Continue amiodarone, metoprolol, and Eliquis - amio re-load ordered per cardiology on 5/28

## 2022-06-12 NOTE — Hospital Course (Signed)
Brenda Matthews is an 87 yo female with PMH chronic dCHF, CKD3b, HTN, HLD who presented with worsening shortness of breath and lower extremity swelling.  She has been followed by cardiology previously and required increased Lasix dosing when developing similar symptoms in April.  She tried using albuterol for her shortness of breath with no improvement but did not increase any Lasix dosing.  Due to persistent SOB, she called for EMS. She was found to be hypoxic, 84% on room air and was started on oxygen. She has known A-fib and is rhythm unaware.  Rate was controlled during workup. BNP was elevated 1,132 and CXR showed bibasilar consolidations with underlying effusion.  She had worsened lower extremity edema as well.  She was started on IV Lasix and admitted for further treatment for CHF exacerbation.

## 2022-06-12 NOTE — Progress Notes (Signed)
  Transition of Care [facility]) Screening Note   Patient Details  Name: Brenda Matthews Date of Birth: Jun 25, 1935   [facility]) CM/SW Contact:    Adrian Prows, RN Phone Number: 06/12/2022, 3:18 PM    Transition of Care Department [facility]) has reviewed patient and no TOC needs have been identified at this time. We will continue to monitor patient advancement through interdisciplinary progression rounds. If new patient transition needs arise, please place a TOC consult.

## 2022-06-12 NOTE — Assessment & Plan Note (Signed)
-  Continue Lipitor °

## 2022-06-12 NOTE — Assessment & Plan Note (Addendum)
-   patient has history of CKD3b. Baseline creat ~ 1.1 - 1.2, eGFR~ 43-45 -At baseline.  Monitor BMP while on diuresis

## 2022-06-12 NOTE — Assessment & Plan Note (Signed)
-   Hold amlodipine and hydralazine in setting of increased Lasix

## 2022-06-12 NOTE — Progress Notes (Signed)
Progress Note    Brenda Matthews   [id number]  DOB: 07/02/1935  DOA: 06/11/2022     0 PCP: Jeoffrey Massed, MD  Initial CC: SOB, LE swelling  Hospital Course: Brenda Matthews is an 87 yo female with PMH chronic dCHF, CKD3b, HTN, HLD who presented with worsening shortness of breath and lower extremity swelling.  She has been followed by cardiology previously and required increased Lasix dosing when developing similar symptoms in April.  She tried using albuterol for her shortness of breath with no improvement but did not increase any Lasix dosing.  Due to persistent SOB, she called for EMS. She was found to be hypoxic, 84% on room air and was started on oxygen. She has known A-fib and is rhythm unaware.  Rate was controlled during workup. BNP was elevated 1,132 and CXR showed bibasilar consolidations with underlying effusion.  She had worsened lower extremity edema as well.  She was started on IV Lasix and admitted for further treatment for CHF exacerbation.  Interval History:  Sitting up in recliner when seen this morning.  Shortness of breath seems mildly improved.  Still has significant lower extremity edema.  Assessment and Plan: * Acute on chronic diastolic CHF (congestive heart failure) (HCC) - presented with orthopnea, pulmonary crackles, pulmonary edema, B/L LE edema, SOB/DOE, pleural effusion  - BNP 1,501 - continue lasix 20 mg IV BID; will increase further if necessary - trend BMP while on diuresis - patient has upcoming followup appt with cardiology on 5/29 as well; might need to consult while hospitalized if prolonged  - strict I&O  PAF (paroxysmal atrial fibrillation) (HCC) - rate controlled -Continue amiodarone, metoprolol, and Eliquis  Chronic kidney disease, stage 3b (HCC) - patient has history of CKD3b. Baseline creat ~ 1.1 - 1.2, eGFR~ 43-45 -At baseline.  Monitor BMP while on diuresis  Pure hypercholesterolemia - Continue Lipitor  Essential hypertension - Hold  amlodipine and hydralazine in setting of increased Lasix   Old records reviewed in assessment of this patient  Antimicrobials:   DVT prophylaxis:  SCDs Start: 06/11/22 1224 apixaban (ELIQUIS) tablet 5 mg   Code Status:   Code Status: Full Code  Mobility Assessment (last 72 hours)     Mobility Assessment     Row Name 06/11/22 2128 06/11/22 1434         Does patient have an order for bedrest or is patient medically unstable No - Continue assessment No - Continue assessment      What is the highest level of mobility based on the progressive mobility assessment? Level 4 (Walks with assist in room) - Balance while marching in place and cannot step forward and back - Complete Level 4 (Walks with assist in room) - Balance while marching in place and cannot step forward and back - Complete               Barriers to discharge: none Disposition Plan:  Home Status is: Obs  Objective: Blood pressure 115/86, pulse (!) 103, temperature 97.6 F (36.4 C), temperature source Oral, resp. rate 20, height 5\' 1"  (1.549 m), weight 84.8 kg, SpO2 97 %.  Examination:  Physical Exam Constitutional:      Appearance: Normal appearance.  HENT:     Head: Normocephalic and atraumatic.     Mouth/Throat:     Mouth: Mucous membranes are moist.  Eyes:     Extraocular Movements: Extraocular movements intact.  Cardiovascular:     Rate and Rhythm: Normal rate. Rhythm irregular.  Pulmonary:     Effort: Pulmonary effort is normal. No respiratory distress.     Breath sounds: Rales present.  Abdominal:     General: Bowel sounds are normal. There is no distension.     Palpations: Abdomen is soft.     Tenderness: There is no abdominal tenderness.  Musculoskeletal:        General: Swelling (2-3+ B/L LE) present. Normal range of motion.     Cervical back: Normal range of motion and neck supple.  Skin:    General: Skin is warm and dry.  Neurological:     General: No focal deficit present.     Mental  Status: She is alert.  Psychiatric:        Mood and Affect: Mood normal.      Consultants:    Procedures:    Data Reviewed: Results for orders placed or performed during the hospital encounter of 06/11/22 (from the past 24 hour(s))  Basic metabolic panel     Status: Abnormal   Collection Time: 06/12/22  5:35 AM  Result Value Ref Range   Sodium 138 135 - 145 mmol/L   Potassium 4.3 3.5 - 5.1 mmol/L   Chloride 108 98 - 111 mmol/L   CO2 21 (L) 22 - 32 mmol/L   Glucose, Bld 171 (H) 70 - 99 mg/dL   BUN 33 (H) 8 - 23 mg/dL   Creatinine, Ser 1.61 (H) 0.44 - 1.00 mg/dL   Calcium 8.9 8.9 - 09.6 mg/dL   GFR, Estimated 41 (L) >60 mL/min   Anion gap 9 5 - 15  CBC     Status: Abnormal   Collection Time: 06/12/22  5:35 AM  Result Value Ref Range   WBC 7.9 4.0 - 10.5 K/uL   RBC 4.24 3.87 - 5.11 MIL/uL   Hemoglobin 12.1 12.0 - 15.0 g/dL   HCT 04.5 40.9 - 81.1 %   MCV 92.5 80.0 - 100.0 fL   MCH 28.5 26.0 - 34.0 pg   MCHC 30.9 30.0 - 36.0 g/dL   RDW 91.4 78.2 - 95.6 %   Platelets 211 150 - 400 K/uL   nRBC 0.4 (H) 0.0 - 0.2 %  Brain natriuretic peptide     Status: Abnormal   Collection Time: 06/12/22  5:35 AM  Result Value Ref Range   B Natriuretic Peptide 1,501.8 (H) 0.0 - 100.0 pg/mL    I have reviewed pertinent nursing notes, vitals, labs, and images as necessary. I have ordered labwork to follow up on as indicated.  I have reviewed the last notes from staff over past 24 hours. I have discussed patient's care plan and test results with nursing staff, CM/SW, and other staff as appropriate.  Time spent: Greater than 50% of the 55 minute visit was spent in counseling/coordination of care for the patient as laid out in the A&P.   LOS: 0 days   Lewie Chamber, MD [facility] 06/12/2022, 11:42 AM

## 2022-06-13 DIAGNOSIS — I5033 Acute on chronic diastolic (congestive) heart failure: Secondary | ICD-10-CM | POA: Diagnosis not present

## 2022-06-13 DIAGNOSIS — I480 Paroxysmal atrial fibrillation: Secondary | ICD-10-CM | POA: Diagnosis not present

## 2022-06-13 LAB — BRAIN NATRIURETIC PEPTIDE: B Natriuretic Peptide: 1077.2 pg/mL — ABNORMAL HIGH (ref 0.0–100.0)

## 2022-06-13 LAB — BASIC METABOLIC PANEL
Anion gap: 9 (ref 5–15)
BUN: 33 mg/dL — ABNORMAL HIGH (ref 8–23)
CO2: 23 mmol/L (ref 22–32)
Calcium: 8.8 mg/dL — ABNORMAL LOW (ref 8.9–10.3)
Chloride: 107 mmol/L (ref 98–111)
Creatinine, Ser: 1.07 mg/dL — ABNORMAL HIGH (ref 0.44–1.00)
GFR, Estimated: 51 mL/min — ABNORMAL LOW (ref >60)
Glucose, Bld: 128 mg/dL — ABNORMAL HIGH (ref 70–99)
Potassium: 3.9 mmol/L (ref 3.5–5.1)
Sodium: 139 mmol/L (ref 135–145)

## 2022-06-13 LAB — MAGNESIUM: Magnesium: 2.2 mg/dL (ref 1.7–2.4)

## 2022-06-13 NOTE — Progress Notes (Signed)
   06/13/22 0740  Assess: MEWS Score  Temp 98.2 F (36.8 C)  BP 134/76  MAP (mmHg) 95  Pulse Rate (!) 111  Resp (!) 22  SpO2 92 %  O2 Device Nasal Cannula  O2 Flow Rate (L/min) 3 L/min  Assess: MEWS Score  MEWS Temp 0  MEWS Systolic 0  MEWS Pulse 2  MEWS RR 1  MEWS LOC 0  MEWS Score 3  MEWS Score Color Yellow  Assess: if the MEWS score is Yellow or Red  Were vital signs taken at a resting state? Yes  Focused Assessment No change from prior assessment  Does the patient meet 2 or more of the SIRS criteria? No  MEWS guidelines implemented  Yes, yellow  Treat  MEWS Interventions Considered administering scheduled or prn medications/treatments as ordered  Take Vital Signs  Increase Vital Sign Frequency  Yellow: Q2hr x1, continue Q4hrs until patient remains green for 12hrs  Escalate  MEWS: Escalate Yellow: Discuss with charge nurse and consider notifying provider and/or RRT  Notify: Charge Nurse/RN  Name of Charge Nurse/RN Notified Launa Flight, RN  Assess: SIRS CRITERIA  SIRS Temperature  0  SIRS Pulse 1  SIRS Respirations  1  SIRS WBC 0  SIRS Score Sum  2   Pt RR and HR increased due to pt being SOB especially when ambulating. MD rounding, Pt stable with no c/o at this time. Will continue to monitor.

## 2022-06-13 NOTE — Plan of Care (Signed)
  Problem: Education: Goal: Knowledge of General Education information will improve Description: Including pain rating scale, medication(s)/side effects and non-pharmacologic comfort measures Outcome: Not Progressing   Problem: Health Behavior/Discharge Planning: Goal: Ability to manage health-related needs will improve Outcome: Not Progressing   Problem: Clinical Measurements: Goal: Ability to maintain clinical measurements within normal limits will improve Outcome: Not Progressing   Problem: Activity: Goal: Risk for activity intolerance will decrease Outcome: Not Progressing   

## 2022-06-13 NOTE — Progress Notes (Signed)
Progress Note    Brenda Matthews   [id number]  DOB: 15-May-1935  DOA: 06/11/2022     1 PCP: Jeoffrey Massed, MD  Initial CC: SOB, LE swelling  Hospital Course: Brenda Matthews is an 87 yo female with PMH chronic dCHF, CKD3b, HTN, HLD who presented with worsening shortness of breath and lower extremity swelling.  She has been followed by cardiology previously and required increased Lasix dosing when developing similar symptoms in April.  She tried using albuterol for her shortness of breath with no improvement but did not increase any Lasix dosing.  Due to persistent SOB, she called for EMS. She was found to be hypoxic, 84% on room air and was started on oxygen. She has known A-fib and is rhythm unaware.  Rate was controlled during workup. BNP was elevated 1,132 and CXR showed bibasilar consolidations with underlying effusion.  She had worsened lower extremity edema as well.  She was started on IV Lasix and admitted for further treatment for CHF exacerbation.  Interval History:   Has been diuresing with IV Lasix.  Still has increased lower extremity edema.  Breathing seems to be improved however.  Assessment and Plan: * Acute on chronic diastolic CHF (congestive heart failure) (HCC) - presented with orthopnea, pulmonary crackles, pulmonary edema, B/L LE edema, SOB/DOE, pleural effusion  - BNP 1,501 - continue lasix 20 mg IV BID; will increase further if necessary - trend BMP while on diuresis - patient has upcoming followup appt with cardiology on 5/29 as well; might need to consult while hospitalized if prolonged  - strict I&O  PAF (paroxysmal atrial fibrillation) (HCC) - rate controlled -Continue amiodarone, metoprolol, and Eliquis  Chronic kidney disease, stage 3b (HCC) - patient has history of CKD3b. Baseline creat ~ 1.1 - 1.2, eGFR~ 43-45 -At baseline.  Monitor BMP while on diuresis  Pure hypercholesterolemia - Continue Lipitor  Essential hypertension - Hold amlodipine and  hydralazine in setting of increased Lasix   Old records reviewed in assessment of this patient  Antimicrobials:   DVT prophylaxis:  SCDs Start: 06/11/22 1224 apixaban (ELIQUIS) tablet 5 mg   Code Status:   Code Status: Full Code  Mobility Assessment (last 72 hours)     Mobility Assessment     Row Name 06/13/22 0800 06/12/22 1915 06/11/22 2128 06/11/22 1434     Does patient have an order for bedrest or is patient medically unstable No - Continue assessment No - Continue assessment No - Continue assessment No - Continue assessment    What is the highest level of mobility based on the progressive mobility assessment? Level 5 (Walks with assist in room/hall) - Balance while stepping forward/back and can walk in room with assist - Complete Level 5 (Walks with assist in room/hall) - Balance while stepping forward/back and can walk in room with assist - Complete Level 4 (Walks with assist in room) - Balance while marching in place and cannot step forward and back - Complete Level 4 (Walks with assist in room) - Balance while marching in place and cannot step forward and back - Complete             Barriers to discharge: none Disposition Plan:  Home Status is: Inpatient  Objective: Blood pressure (!) 127/94, pulse (!) 106, temperature 97.8 F (36.6 C), temperature source Oral, resp. rate (!) 24, height 5\' 1"  (1.549 m), weight 84.8 kg, SpO2 97 %.  Examination:  Physical Exam Constitutional:      Appearance: Normal appearance.  HENT:     Head: Normocephalic and atraumatic.     Mouth/Throat:     Mouth: Mucous membranes are moist.  Eyes:     Extraocular Movements: Extraocular movements intact.  Cardiovascular:     Rate and Rhythm: Normal rate. Rhythm irregular.  Pulmonary:     Effort: Pulmonary effort is normal. No respiratory distress.     Breath sounds: Rales present.  Abdominal:     General: Bowel sounds are normal. There is no distension.     Palpations: Abdomen is soft.      Tenderness: There is no abdominal tenderness.  Musculoskeletal:        General: Swelling (2-3+ B/L LE) present. Normal range of motion.     Cervical back: Normal range of motion and neck supple.  Skin:    General: Skin is warm and dry.  Neurological:     General: No focal deficit present.     Mental Status: She is alert.  Psychiatric:        Mood and Affect: Mood normal.      Consultants:    Procedures:    Data Reviewed: Results for orders placed or performed during the hospital encounter of 06/11/22 (from the past 24 hour(s))  Basic metabolic panel     Status: Abnormal   Collection Time: 06/13/22  8:21 AM  Result Value Ref Range   Sodium 139 135 - 145 mmol/L   Potassium 3.9 3.5 - 5.1 mmol/L   Chloride 107 98 - 111 mmol/L   CO2 23 22 - 32 mmol/L   Glucose, Bld 128 (H) 70 - 99 mg/dL   BUN 33 (H) 8 - 23 mg/dL   Creatinine, Ser 6.04 (H) 0.44 - 1.00 mg/dL   Calcium 8.8 (L) 8.9 - 10.3 mg/dL   GFR, Estimated 51 (L) >60 mL/min   Anion gap 9 5 - 15  Brain natriuretic peptide     Status: Abnormal   Collection Time: 06/13/22  8:21 AM  Result Value Ref Range   B Natriuretic Peptide 1,077.2 (H) 0.0 - 100.0 pg/mL  Magnesium     Status: None   Collection Time: 06/13/22  8:21 AM  Result Value Ref Range   Magnesium 2.2 1.7 - 2.4 mg/dL    I have reviewed pertinent nursing notes, vitals, labs, and images as necessary. I have ordered labwork to follow up on as indicated.  I have reviewed the last notes from staff over past 24 hours. I have discussed patient's care plan and test results with nursing staff, CM/SW, and other staff as appropriate.  Time spent: Greater than 50% of the 55 minute visit was spent in counseling/coordination of care for the patient as laid out in the A&P.   LOS: 1 day   Lewie Chamber, MD [facility] 06/13/2022, 1:58 PM

## 2022-06-13 NOTE — TOC Initial Note (Addendum)
Transition of Care [facility]) - Initial/Assessment Note    Patient Details  Name: Brenda Matthews MRN: [id number] Date of Birth: 12-Apr-1935  [facility]) CM/SW Contact:    Howell Rucks, RN Phone Number: 06/13/2022, 11:17 AM  Clinical Narrative:   [facility] consult for short term rehab-SNF - son requesting rehab at [facility]. Await PT recommendation.  TOC will continue to follow.                       Patient Goals and CMS Choice            Expected Discharge Plan and Services                                              Prior Living Arrangements/Services                       Activities of Daily Living Home Assistive Devices/Equipment: Cane (specify quad or straight) ADL Screening (condition at time of admission) Patient's cognitive ability adequate to safely complete daily activities?: Yes Is the patient deaf or have difficulty hearing?: No Does the patient have difficulty seeing, even when wearing glasses/contacts?: No Does the patient have difficulty concentrating, remembering, or making decisions?: No Patient able to express need for assistance with ADLs?: Yes Does the patient have difficulty dressing or bathing?: No Independently performs ADLs?: Yes (appropriate for developmental age) Does the patient have difficulty walking or climbing stairs?: No Weakness of Legs: None Weakness of Arms/Hands: None  Permission Sought/Granted                  Emotional Assessment              Admission diagnosis:  Shortness of breath [R06.02] Acute on chronic diastolic (congestive) heart failure (HCC) [I50.33] Congestive heart failure, unspecified HF chronicity, unspecified heart failure type (HCC) [I50.9] Patient Active Problem List   Diagnosis Date Noted   Acute on chronic diastolic (congestive) heart failure (HCC) 06/11/2022   Idiopathic acute pancreatitis without necrosis or infection 09/14/2020   Pancreatic cyst 09/14/2020    Pancreatic duct dilated 09/14/2020   Pure hypercholesterolemia 09/14/2020   Hypertensive heart and chronic kidney disease with heart failure and stage 1 through stage 4 chronic kidney disease, or unspecified chronic kidney disease (HCC) 09/28/2018   Spinal stenosis, lumbar region with neurogenic claudication 09/26/2018   Osteoarthritis of right knee 11/21/2017   Acute on chronic diastolic CHF (congestive heart failure) (HCC)    S/P thoracentesis    Acute respiratory failure (HCC)    Rash in adult 08/30/2017   Atrial fibrillation and flutter (HCC)    DOE (dyspnea on exertion) 08/29/2017   Arterial hypotension    Chronic anticoagulation 12/19/2016   Low TSH level 02/19/2016   PAF (paroxysmal atrial fibrillation) (HCC) 02/18/2016   Ankle fracture, left-s/p surgery 02/18/16 02/18/2016   Closed displaced trimalleolar fracture of left ankle 02/18/2016   Acute bronchitis 02/10/2015   Bronchopneumonia 12/02/2013   Chronic kidney disease, stage 3b (HCC) 12/04/2012   Hx of adenomatous colonic polyps    Routine general medical examination at a health care facility 09/05/2011   Cervical cancer screening 02/24/2011   SCIATICA, BILATERAL 09/01/2009   SACROILIAC STRAIN, ACUTE 09/01/2009   ANEMIA 11/07/2007   Osteopenia of the elderly 11/07/2007   INSOMNIA 11/07/2007  EDEMA 08/14/2007   Hyperlipidemia 04/04/2007   PANIC DISORDER 04/04/2007   Essential hypertension 04/04/2007   Anxiety state 12/27/2006   ARTHRITIS 12/27/2006   PCP:  Jeoffrey Massed, MD Pharmacy:   [facility] [place], [place] - [address] [place] [address] Phone: [phone] Fax: [phone]  [facility] [place], [place] - [address] [place] [address] Phone: [phone] Fax: [phone]     Social Determinants of Health (SDOH) Social History: SDOH Screenings   Food Insecurity: No Food Insecurity (06/11/2022)  Housing: Patient Declined  (06/11/2022)  Transportation Needs: No Transportation Needs (06/11/2022)  Utilities: Not At Risk (06/11/2022)  Alcohol Screen: Low Risk  (10/11/2020)  Depression (PHQ2-9): Low Risk  (10/27/2021)  Financial Resource Strain: Low Risk  (10/27/2021)  Physical Activity: Sufficiently Active (10/27/2021)  Social Connections: Moderately Integrated (10/27/2021)  Stress: No Stress Concern Present (10/27/2021)  Tobacco Use: Low Risk  (06/11/2022)   SDOH Interventions:     Readmission Risk Interventions     No data to display

## 2022-06-13 NOTE — Plan of Care (Signed)
  Problem: Education: Goal: Knowledge of General Education information will improve Description: Including pain rating scale, medication(s)/side effects and non-pharmacologic comfort measures Outcome: Progressing   Problem: Health Behavior/Discharge Planning: Goal: Ability to manage health-related needs will improve Outcome: Progressing   Problem: Clinical Measurements: Goal: Ability to maintain clinical measurements within normal limits will improve Outcome: Progressing   Problem: Activity: Goal: Risk for activity intolerance will decrease Outcome: Not Progressing

## 2022-06-14 DIAGNOSIS — I480 Paroxysmal atrial fibrillation: Secondary | ICD-10-CM | POA: Diagnosis not present

## 2022-06-14 DIAGNOSIS — I10 Essential (primary) hypertension: Secondary | ICD-10-CM | POA: Diagnosis not present

## 2022-06-14 DIAGNOSIS — R5381 Other malaise: Secondary | ICD-10-CM | POA: Diagnosis not present

## 2022-06-14 DIAGNOSIS — N1832 Chronic kidney disease, stage 3b: Secondary | ICD-10-CM | POA: Diagnosis not present

## 2022-06-14 DIAGNOSIS — I5033 Acute on chronic diastolic (congestive) heart failure: Secondary | ICD-10-CM | POA: Diagnosis not present

## 2022-06-14 LAB — BASIC METABOLIC PANEL
Anion gap: 6 (ref 5–15)
BUN: 30 mg/dL — ABNORMAL HIGH (ref 8–23)
CO2: 23 mmol/L (ref 22–32)
Calcium: 8.4 mg/dL — ABNORMAL LOW (ref 8.9–10.3)
Chloride: 107 mmol/L (ref 98–111)
Creatinine, Ser: 1 mg/dL (ref 0.44–1.00)
GFR, Estimated: 55 mL/min — ABNORMAL LOW (ref >60)
Glucose, Bld: 104 mg/dL — ABNORMAL HIGH (ref 70–99)
Potassium: 3.7 mmol/L (ref 3.5–5.1)
Sodium: 136 mmol/L (ref 135–145)

## 2022-06-14 LAB — MAGNESIUM: Magnesium: 2.1 mg/dL (ref 1.7–2.4)

## 2022-06-14 MED ORDER — AMIODARONE HCL 200 MG PO TABS
200.0000 mg | ORAL_TABLET | Freq: Two times a day (BID) | ORAL | Status: DC
  Administered 2022-06-14 – 2022-06-24 (×21): 200 mg via ORAL
  Filled 2022-06-14 (×21): qty 1

## 2022-06-14 MED ORDER — FUROSEMIDE 10 MG/ML IJ SOLN
40.0000 mg | Freq: Two times a day (BID) | INTRAMUSCULAR | Status: DC
  Administered 2022-06-14 – 2022-06-18 (×9): 40 mg via INTRAVENOUS
  Filled 2022-06-14 (×9): qty 4

## 2022-06-14 MED ORDER — AMIODARONE HCL 200 MG PO TABS: 400.0000 mg | ORAL_TABLET | Freq: Two times a day (BID) | ORAL | Status: DC

## 2022-06-14 MED ORDER — FUROSEMIDE 40 MG PO TABS: 40.0000 mg | ORAL_TABLET | Freq: Every day | ORAL | Status: DC

## 2022-06-14 NOTE — Assessment & Plan Note (Signed)
-   Patient and son preferring for rehab at discharge.  She does not feel strong enough for returning to independent living - Evaluated by PT as well and in agreement - Preference is rehab at [facility] once she is cleared medically

## 2022-06-14 NOTE — Evaluation (Signed)
Physical Therapy Evaluation Patient Details Name: Brenda Matthews MRN: [id number] DOB: Jun 25, 1935 Today's Date: 06/14/2022  History of Present Illness  Ms. Seaberg is an 87 yo female with PMH chronic dCHF, CKD3b, HTN, HLD who presented 06/11/22  with worsening shortness of breath and lower extremity swelling.  Clinical Impression  The patient  is resting in bed on 2 L [place], SPO2 96%. Patient   transferred to [facility] then ambulated x 40' on 2 LPM, SPO2 93%, Dyspnea 3/4, with some difficulty speaking. Patient will benefit from PT while in hospital and after DC.  Pt admitted with above diagnosis.  Pt currently with functional limitations due to the deficits listed below (see PT Problem List). Pt will benefit from acute skilled PT to increase their independence and safety with mobility to allow discharge.          Recommendations for follow up therapy are one component of a multi-disciplinary discharge planning process, led by the attending physician.  Recommendations may be updated based on patient status, additional functional criteria and insurance authorization.  Follow Up Recommendations Can patient physically be transported by private vehicle: Yes     Assistance Recommended at Discharge Intermittent Supervision/Assistance  Patient can return home with the following  A little help with walking and/or transfers;A little help with bathing/dressing/bathroom;Help with stairs or ramp for entrance;Assist for transportation    Equipment Recommendations None recommended by PT  Recommendations for Other Services       Functional Status Assessment Patient has had a recent decline in their functional status and demonstrates the ability to make significant improvements in function in a reasonable and predictable amount of time.     Precautions / Restrictions Precautions Precautions: Fall Precaution Comments: monitor sats, on 2-3 LPM Restrictions Weight Bearing Restrictions: No      Mobility  Bed  Mobility Overal bed mobility: Modified Independent                  Transfers Overall transfer level: Needs assistance Equipment used: 1 person hand held assist Transfers: Sit to/from Stand Sit to Stand: Min guard           General transfer comment: holds onto bed    Ambulation/Gait Ambulation/Gait assistance: Min assist Gait Distance (Feet): 40 Feet Assistive device: 1 person hand held assist Gait Pattern/deviations: Step-through pattern, Step-to pattern, Drifts right/left, Trunk flexed Gait velocity: decr     General Gait Details: holds onto bed and foot board, , HHA PRN. LOB x 1  Stairs            Wheelchair Mobility    Modified Rankin (Stroke Patients Only)       Balance Overall balance assessment: Mild deficits observed, not formally tested                                           Pertinent Vitals/Pain Pain Assessment Pain Assessment: No/denies pain    Home Living Family/patient expects to be discharged to:: Private residence Living Arrangements: Alone Available Help at Discharge: Available PRN/intermittently Type of Home: Independent living facility Home Access: Level entry       Home Layout: One level Home Equipment: Agricultural consultant (2 wheels);Rollator (4 wheels);Cane - single point Additional Comments: ILF at [facility]    Prior Function Prior Level of Function : Independent/Modified Independent             Mobility Comments:  uses a cane when goes out, some times, Has PT 3 x's/wk ADLs Comments: IADLs     Hand Dominance   Dominant Hand: Right    Extremity/Trunk Assessment   Upper Extremity Assessment Upper Extremity Assessment: Overall WFL for tasks assessed    Lower Extremity Assessment Lower Extremity Assessment: Generalized weakness    Cervical / Trunk Assessment Cervical / Trunk Assessment: Kyphotic  Communication   Communication: No difficulties  Cognition Arousal/Alertness:  Awake/alert Behavior During Therapy: WFL for tasks assessed/performed Overall Cognitive Status: Within Functional Limits for tasks assessed                                          General Comments      Exercises     Assessment/Plan    PT Assessment Patient needs continued PT services  PT Problem List Decreased strength;Decreased mobility;Decreased safety awareness;Decreased knowledge of precautions;Decreased activity tolerance;Cardiopulmonary status limiting activity;Decreased balance;Decreased knowledge of use of DME       PT Treatment Interventions DME instruction;Therapeutic activities;Gait training;Therapeutic exercise;Patient/family education;Functional mobility training;Balance training    PT Goals (Current goals can be found in the Care Plan section)  Acute Rehab PT Goals Patient Stated Goal: to take care of myself PT Goal Formulation: With patient Time For Goal Achievement: 06/27/22 Potential to Achieve Goals: Good    Frequency Min 1X/week     Co-evaluation               AM-PAC PT "6 Clicks" Mobility  Outcome Measure Help needed turning from your back to your side while in a flat bed without using bedrails?: None Help needed moving from lying on your back to sitting on the side of a flat bed without using bedrails?: None Help needed moving to and from a bed to a chair (including a wheelchair)?: None Help needed standing up from a chair using your arms (e.g., wheelchair or bedside chair)?: A Little Help needed to walk in hospital room?: A Little Help needed climbing 3-5 steps with a railing? : A Lot 6 Click Score: 20    End of Session Equipment Utilized During Treatment: Gait belt;Oxygen Activity Tolerance: Treatment limited secondary to medical complications (Comment) (DOE) Patient left: in bed;with call bell/phone within reach;with bed alarm set Nurse Communication: Mobility status PT Visit Diagnosis: Unsteadiness on feet  (R26.81);History of falling (Z91.81);Difficulty in walking, not elsewhere classified (R26.2)    Time: 0830-0900 PT Time Calculation (min) (ACUTE ONLY): 30 min   Charges:   PT Evaluation $PT Eval Low Complexity: 1 Low PT Treatments $Gait Training: 8-22 mins       Blanchard Kelch PT Acute Rehabilitation Services Office [phone] Weekend [phone]   Rada Hay 06/14/2022, 10:35 AM

## 2022-06-14 NOTE — Consult Note (Addendum)
Cardiology Consultation   Patient ID: Brenda Matthews MRN: [id number]; DOB: [birth year]  Admit date: 06/11/2022 Date of Consult: 06/14/2022  PCP:  Jeoffrey Massed, MD   [facility] Providers Cardiologist:  Chrystie Nose, MD        Patient Profile:   Brenda Matthews is a 87 y.o. female with a history of normal coronaries on cardiac catheterization in 08/2017, paroxysmal atrial fibrillation, chronic diastolic CHF, hypertension, hyperlipidemia, hyperthyroidism, and CKD stage III  who is being seen 06/14/2022 for the evaluation of acute on chronic diastolic CHF at the request of Dr. Frederick Peers.  History of Present Illness:   Brenda Matthews is a 87 year old female with the above history who is followed by Dr. Rennis Golden. Patient was first seen by Dr. Rennis Golden in 02/2016 for new onset of atrial fibrillation with RVR after induction with anesthesia during surgery for ankle fracture. Echo showed normal LV function. She was initially started on low dose beta-blocker and Xarelto. She was ultimately taken off beta-blocker due to low BP but thankfully converted back to sinus rhythm on her home. She was advised to continued Xarelto given CHA2DS2-VASc score of 3.    She had recurrent episode of atrial fibrillation with RVR in 07/2017. Echo showed LVEF of 50-55% with normal wall motion, biatrial enlargement, mild MR, and mild to moderate TR.Marland Kitchen She was started on Amiodarone and outpatient DCCV was arranged. However, when she presented for procedure, she was back in sinus rhythm. Patient was admitted in 08/2017 with acute on chronic hypoxic respiratory failure  secondary to pneumonia with sepsis and acute diastolic CHF. She was noted to be back in atrial fibrillation with RVR. Troponin was elevated so she underwent a cardiac catheterization which showed normal coronaries. She was diuresed and treated with antibiotics for her pneumonia. At follow-up visit, she was noted to be back in sinus rhythm. She has had no recurrence of  atrial fibrillation since that time.  Patient was seen in the office by  Wallis Bamberg, NP, in 03/2022 and reported worsening dyspnea on exertion and edema. She was noted to be volume overloaded on exam. BNP was elevated at 156. Lasix was increased for a few days. Repeat Echo was also ordered. She was last seen by Edd Fabian, NP, on 04/28/2022 at which time she still reported dyspnea on exertion but her weight was stable. Lasix was increased for 3 days and then usual dosing of 40mg  daily was resumed. Echo were had been ordered at prior visit was finally completed on 06/09/2022 and showed LVEF of 50-55% with mild LVH, normal RV, and mild dilatation of the ascending aorta measuring 41 mm.  Patient presented to the [facility] ED on 06/11/2022 via EMS for progressive shortness of breath. Patient reports waxing and waning shortness of breath over the last 2 months. She describes dyspnea on exertion but denies any significant shortness of breath at rest. She also describes stable orthopnea and occasional PND. She has chronic lower extremity edema and also feels like this has been worse over the last 2 months. She denies any chest pain, palpitations, significant dizziness, or syncope. She has had a cough the last couple of weeks but no other recent fevers or illnesses. No GI symptoms. No abnormal bleeding in urine or stools  She states her shortness of breath acutely worsened on Friday 06/10/2022 and she felt like she was going to die which is why she ended up calling 911. O2 sats were 84% on room air  upon EMS arrival. Upon arrival to the ED, EKG showed atrial fibrillation, rate 105 bpm, with no acute ST/T changes. High-sensitivity troponin negative x2. BNP markedly elevated at 1,132. Chest x-ray showed bibasilar collapse/ consolidate (left > right).  WBC 8.1, Hgb 11.9, Plts 189. Na 141, K 3.9, Glucose 120. BUN 27, Cr 0.94. LFTs normal. Patient was started on IV Lasix and admitted to medicine service. Cardiology  consulted for further evaluation.   At the time of this evaluation, she is still on supplemental O2. She feels like she is breathing better after being diuresed with IV Lasix but is still not quite back to normal.   Past Medical History:  Diagnosis Date   Abnormal EKG approx 2008   Nuclear stress test neg;    Acute pancreatitis 09/2020   idiopathic.  +panc psudocyst   Anxiety    with panic   CAP (community acquired pneumonia) 08/2017   Hospitalization for CAP/acute diast HF/rapid a-fib   Cataract    s/p surgery--lens implants   Chronic diastolic heart failure (HCC) 2019   Chronic renal insufficiency, stage 3 (moderate) (HCC) 12/04/2012   Renal u/s when in hosp 08/2017 for CAP/CHF showed symmetric kidneys, echogenicity normal, w/out hydronephrosis. Baseline GFR around 40 ml/min as of 10/2018.   DDD (degenerative disc disease), lumbar    Diverticulosis 2009   Fracture of radial shaft, left, closed 11/16/2010   fell down flight of stairs   History of kidney stones    Hx of adenomatous colonic polyps 2002;2009;2015   surveillance colonoscopy 2009, +polypectomy done-tubular adenoma w/out high grade.  05/2013 tubular adenomas--recall 3 yrs   Hyperlipidemia    Hypertension    Low TSH level 02/18/2016   T3 norm, T4 mildly elevated--suspected sick euthyroid syndrome.  Repeat labs 06/2016: normal.   Melanoma in situ (HCC) 06/2018   L LL   Osteoarthritis of both knees    viscosupplementation injections helpful 2020/21   Osteopenia    DEXA 08/2010; repeat DEXA 02/2015 worse: fosamax started.  06/2018 Dexa T score -2.4.  2020 maj osteop fx risk = 24%, Hip fx risk 7.3%. 09/2020 T score -2.1   PAF (paroxysmal atrial fibrillation) (HCC) 02/2016   when in post-op for ankle surgery; spontaneously converted in hosp, seen by Dr. Rennis Golden in consultation--metoprolol rate control + xarelto recommended.  Metop d/c due to hypot.  Plan to cont xarelto 20 mg qd indef due to CHAD-VASc score of 3.  A-fib w/RVR and  CHF 07/2017; pt placed on amiodarone and plan for CV, but pt was in sinus rhythm when she went in for her DC CV, so she was sent home.   Peripheral edema    Pneumonia 2015   hx with sepsis   Rheumatic fever     Past Surgical History:  Procedure Laterality Date   APPENDECTOMY  1966   done during surgery for tubal pregnancy   CATARACT EXTRACTION W/ INTRAOCULAR LENS IMPLANT  2013   bilat   COLONOSCOPY W/ POLYPECTOMY  05/2013   +diverticulosis; recall 3 yrs (Dr. Marina Goodell)   DEXA  02/2015; 06/2018   T score -2.1 in both femoral necks; FRAX 10 yr risk of major osteoporotic fracture was 21%---fosamax started. 06/2018 T score -2.4.  T score 09/2020 -2.1. Rpt 2 yrs.   ECTOPIC PREGNANCY SURGERY     EYE SURGERY     LEFT HEART CATH AND CORONARY ANGIOGRAPHY N/A 09/01/2017   No angiographically significant CAD.  Upper normal left ventricular filling pressure.  Procedure: LEFT HEART CATH  AND CORONARY ANGIOGRAPHY;  Surgeon: Yvonne Kendall, MD;  Location: [facility] INVASIVE CV LAB;  Service: Cardiovascular;  Laterality: N/A;   LUMBAR LAMINECTOMY/DECOMPRESSION MICRODISCECTOMY N/A 09/26/2018   Procedure: Decompressive Lumbar Laminectomy L5 S1 FORAMINOTOMY L5 S1  NERVE ROOT BILATERALLY and Microdiscectomy L5-S1 Left;  Surgeon: Ranee Gosselin, MD;  Location: [facility] ORS;  Service: Orthopedics;  Laterality: N/A;    OPEN REDUCTION INTERNAL FIXATION (ORIF) TIBIA/FIBULA FRACTURE Left 02/18/2016   Procedure: OPEN REDUCTION INTERNAL FIXATION (ORIF) Right ankle trimalleolar fracture;  Surgeon: Toni Arthurs, MD;  Location: [facility] OR;  Service: Orthopedics;  Laterality: Left;  requests   ORIF RADIAL FRACTURE  11/18/2010   left; s/p slip on slippery floor and fell   THORACENTESIS  08/2017   diagnostic and therapeutic.  Transudative.  Clx neg.  (+pulm edema/diastolic HF)   TONSILLECTOMY     TRANSTHORACIC ECHOCARDIOGRAM  02/18/2016; 08/10/17   LVEF of 55-60%, mild AI and mild MR and normal biatrial size.  07/2017--normal LV  function, mild enlarge aortic root, mild/mod TR, bilat atrial enlargement.     Home Medications:  Prior to Admission medications   Medication Sig Start Date End Date Taking? Authorizing Provider  acetaminophen (TYLENOL) 650 MG CR tablet Take 1,300 mg by mouth daily.   Yes [provider]  albuterol (VENTOLIN HFA) 108 (90 Base) MCG/ACT inhaler INHALE 2 PUFFS BY MOUTH into THE lungs EVERY 4 HOURS AS NEEDED FOR WHEEZING OR SHORTNESS OF BREATH Patient taking differently: Inhale 2 puffs into the lungs See admin instructions. INHALE 2 PUFFS BY MOUTH into THE lungs EVERY 4 HOURS AS NEEDED FOR WHEEZING OR SHORTNESS OF BREATH 05/02/22  Yes McGowen, Maryjean Morn, MD  ALPRAZolam (XANAX) 0.5 MG tablet TAKE 1 TABLET BY MOUTH 3 TIMES DAILY AS NEEDED FOR ANXIETY Patient taking differently: Take 0.5 mg by mouth every evening. 09/21/21  Yes McGowen, Maryjean Morn, MD  amiodarone (PACERONE) 200 MG tablet Take 200 mg by mouth daily. 09/28/18  Yes [provider]  amLODipine (NORVASC) 10 MG tablet TAKE 1 TABLET BY MOUTH EVERY DAY 01/11/22  Yes McGowen, Maryjean Morn, MD  apixaban (ELIQUIS) 5 MG TABS tablet Take 1 tablet (5 mg total) by mouth 2 (two) times daily. 04/07/22  Yes Flossie Dibble, NP  Apoaequorin (PREVAGEN PO) Take 1 tablet by mouth daily.   Yes [provider]  Ascorbic Acid (VITAMIN C PO) Take by mouth daily.   Yes [provider]  atorvastatin (LIPITOR) 40 MG tablet TAKE 1 TABLET BY MOUTH EVERY DAY 01/11/22  Yes McGowen, Maryjean Morn, MD  Biotin 5000 MCG TABS Take 5,000 mcg by mouth daily.   Yes [provider]  Calcium Carbonate (CALCIUM 600 PO) Take 1,200 mg by mouth daily.    Yes [provider]  carboxymethylcellulose (REFRESH PLUS) 0.5 % SOLN Place 2 drops into both eyes daily as needed (dry/irritated eyes.).   Yes [provider]  Coenzyme Q10 200 MG capsule Take 200 mg by mouth daily.   Yes [provider]  fluticasone (FLONASE) 50 MCG/ACT  nasal spray Place 2 sprays into both nostrils daily.    Yes [provider]  furosemide (LASIX) 40 MG tablet TAKE 1 TABLET BY MOUTH EVERY DAY 01/11/22  Yes McGowen, Maryjean Morn, MD  hydrALAZINE (APRESOLINE) 50 MG tablet TAKE 1 TABLET BY MOUTH 3 TIMES DAILY 01/11/22  Yes McGowen, Maryjean Morn, MD  metoprolol tartrate (LOPRESSOR) 100 MG tablet Take 1 tablet (100 mg total) by mouth 2 (two) times daily. 01/07/22  Yes Hilty, Lisette Abu, MD  Multiple Vitamins-Minerals (CENTRUM SILVER ULTRA WOMENS) TABS Take 1 tablet by mouth every evening.   Yes [provider]  potassium chloride SA (KLOR-CON M) 20 MEQ tablet TAKE 1 TABLET BY MOUTH 2 TIMES DAILY Patient taking differently: Take 20 mEq by mouth 2 (two) times daily. 10/21/21  Yes Hilty, Lisette Abu, MD    Inpatient Medications: Scheduled Meds:  amiodarone  200 mg Oral Daily   apixaban  5 mg Oral BID   atorvastatin  40 mg Oral Daily   furosemide  40 mg Intravenous BID   metoprolol tartrate  100 mg Oral BID   potassium chloride  20 mEq Oral BID   Continuous Infusions:  PRN Meds: acetaminophen **OR** acetaminophen, albuterol, ALPRAZolam, hydrALAZINE, Muscle Rub, ondansetron **OR** ondansetron (ZOFRAN) IV, traZODone  Allergies:    Allergies  Allergen Reactions   Augmentin [Amoxicillin-Pot Clavulanate] Nausea And Vomiting and Other (See Comments)    "projectile vomiting" Has patient had a PCN reaction causing immediate rash, facial/tongue/throat swelling, SOB or lightheadedness with hypotension:No Has patient had a PCN reaction causing severe rash involving mucus membranes or skin necrosis:No Has patient had a PCN reaction that required hospitalization:No Has patient had a PCN reaction occurring within the last 10 years:Yes If all of the above answers are "NO", then may proceed with Cephalosporin use.    Amoxicillin Rash   Clindamycin/Lincomycin Rash    Social History:   Social History   Socioeconomic History   Marital status:  Widowed    Spouse name: Not on file   Number of children: Not on file   Years of education: Not on file   Highest education level: Not on file  Occupational History   Not on file  Tobacco Use   Smoking status: Never   Smokeless tobacco: Never  Vaping Use   Vaping Use: Never used  Substance and Sexual Activity   Alcohol use: Yes    Comment: rarely   Drug use: Never   Sexual activity: Not on file  Other Topics Concern   Not on file  Social History Narrative   Widow, 2 sons.   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   No tobacco.  Rare alcohol.   No drugs.  Exercise: 4 times per week, about 4mi.   Social Determinants of Health   Financial Resource Strain: Low Risk  (10/27/2021)   Overall Financial Resource Strain (CARDIA)    Difficulty of Paying Living Expenses: Not hard at all  Food Insecurity: No Food Insecurity (06/11/2022)   Hunger Vital Sign    Worried About Running Out of Food in the Last Year: Never true    Ran Out of Food in the Last Year: Never true  Transportation Needs: No Transportation Needs (06/11/2022)   PRAPARE - Administrator, Civil Service (Medical): No    Lack of Transportation (Non-Medical): No  Physical Activity: Sufficiently Active (10/27/2021)   Exercise Vital Sign    Days of Exercise per Week: 5 days    Minutes of Exercise per Session: 30 min  Stress: No Stress Concern Present (10/27/2021)   [employer] of Occupational Health - Occupational Stress Questionnaire    Feeling of Stress : Not at all  Social Connections: Moderately Integrated (10/27/2021)   Social Connection and Isolation Panel [NHANES]    Frequency of Communication with Friends and Family: More than three times a week    Frequency of Social Gatherings with Friends and Family: More than three times a week  Attends Religious Services: More than 4 times per year    Active Member of Clubs or Organizations: Yes    Attends Banker Meetings: More than 4 times per year     Marital Status: Widowed  Intimate Partner Violence: Not At Risk (06/11/2022)   Humiliation, Afraid, Rape, and Kick questionnaire    Fear of Current or Ex-Partner: No    Emotionally Abused: No    Physically Abused: No    Sexually Abused: No    Family History:   Family History  Problem Relation Age of Onset   Heart disease Mother    Heart disease Father    Hypertension Brother    Diabetes Sister    Colon cancer Neg Hx    Pancreatic cancer Neg Hx    Rectal cancer Neg Hx    Stomach cancer Neg Hx      ROS:  Please see the history of present illness.  Review of Systems  Constitutional:  Negative for chills and fever.  HENT:  Negative for congestion.   Respiratory:  Positive for cough and shortness of breath.   Cardiovascular:  Positive for orthopnea, leg swelling and PND. Negative for chest pain and palpitations.  Gastrointestinal:  Negative for blood in stool, melena, nausea and vomiting.  Genitourinary:  Negative for hematuria.  Musculoskeletal:  Negative for myalgias.  Neurological:  Negative for dizziness and loss of consciousness.  Psychiatric/Behavioral:  Negative for substance abuse.    Physical Exam/Data:   Vitals:   06/14/22 0011 06/14/22 0621 06/14/22 1051 06/14/22 1057  BP: (!) 127/90 (!) 129/91 (!) 127/90   Pulse: (!) 103 73 (!) 108   Resp: 20 (!) 21    Temp: 98.1 F (36.7 C) 98 F (36.7 C)    TempSrc: Oral Oral    SpO2: 97% 96% (!) 89% 93%  Weight:      Height:        Intake/Output Summary (Last 24 hours) at 06/14/2022 1159 Last data filed at 06/14/2022 1104 Gross per 24 hour  Intake 580 ml  Output 1240 ml  Net -660 ml      06/11/2022    2:34 PM 06/11/2022    8:18 AM 04/28/2022    8:38 AM  Last 3 Weights  Weight (lbs) 186 lb 15.2 oz 185 lb 6.5 oz 185 lb 6.4 oz  Weight (kg) 84.8 kg 84.1 kg 84.097 kg     Body mass index is 35.32 kg/m.  General: 87 y.o. Caucasian female resting comfortably in no acute distress. HEENT: Normocephalic and  atraumatic. Sclera clear.  Neck: Supple. No JVD. Heart: Tachycardic with irregularly irregular rhythm. Distinct S1 and S2. No murmurs, gallops, or rubs. Lungs: No increased work of breathing. Diminished breaths sounds in bilateral bases.  Abdomen: Soft, non-distended, and non-tender to palpation.  MSK: Normal strength and tone for age. Extremities: 1-2+ pitting edema of bilateral lower extremities (left chronically larger than right).   Skin: Warm and dry. Neuro: Alert and oriented x3. No focal deficits. Psych: Normal affect. Responds appropriately.   EKG:  The EKG was personally reviewed and demonstrates:  Atrial fibrillation, rate 105 bpm, with no acute ST/T changes. Telemetry:  Telemetry was personally reviewed and demonstrates:  Atrial fibrillation with rates mostly in the low 100s to 120s. PVCs and short runs of NSVT (longest run about 3 beats) also noted.  Relevant CV Studies:  Left Cardiac Catheterization 09/01/2017: Conclusions: No angiographically significant coronary artery disease. Upper normal left ventricular filling pressure.   Recommendations:  Continue medical therapy of diastolic heart failure and atrial fibrillation. Gentle post-catheterization hydration given resolved acute kidney injury. Restart heparin infusion 2 hours after TR band removal; rivaroxaban could be restarted as soon as tomorrow.   Recommend to resume Rivaroxaban, at currently prescribed dose and frequency, on 09/01/17.  Concurrent antiplatelet therapy not recommended.  _______________  Echocardiogram 06/09/2022: Impressions: 1. Left ventricular ejection fraction, by estimation, is 50 to 55%. The  left ventricle has low normal function. Left ventricular endocardial  border not optimally defined to evaluate regional wall motion. There is  mild concentric left ventricular  hypertrophy. Left ventricular diastolic parameters are indeterminate.   2. Right ventricular systolic function is normal. The right  ventricular  size is normal.   3. Left atrial size was moderately dilated.   4. Right atrial size was mildly dilated.   5. The mitral valve is grossly normal. Mild to moderate mitral valve  regurgitation.   6. The aortic valve is tricuspid. There is mild calcification of the  aortic valve. There is mild thickening of the aortic valve. Aortic valve  regurgitation is mild. Aortic valve sclerosis/calcification is present,  without any evidence of aortic  stenosis.   7. Aortic dilatation noted. There is mild dilatation of the ascending  aorta, measuring 41 mm.   Comparison(s): No significant change from prior study.   Laboratory Data:  High Sensitivity Troponin:   Recent Labs  Lab 06/11/22 0916 06/11/22 1115  TROPONINIHS 8 8     Chemistry Recent Labs  Lab 06/12/22 0535 06/13/22 0821 06/14/22 0517  NA 138 139 136  K 4.3 3.9 3.7  CL 108 107 107  CO2 21* 23 23  GLUCOSE 171* 128* 104*  BUN 33* 33* 30*  CREATININE 1.29* 1.07* 1.00  CALCIUM 8.9 8.8* 8.4*  MG  --  2.2 2.1  GFRNONAA 41* 51* 55*  ANIONGAP 9 9 6     Recent Labs  Lab 06/11/22 0916  PROT 6.5  ALBUMIN 3.5  AST 33  ALT 30  ALKPHOS 84  BILITOT 0.8   Lipids No results for input(s): "CHOL", "TRIG", "HDL", "LABVLDL", "LDLCALC", "CHOLHDL" in the last 168 hours.  Hematology Recent Labs  Lab 06/11/22 0916 06/12/22 0535  WBC 8.1 7.9  RBC 4.26 4.24  HGB 11.9* 12.1  HCT 39.1 39.2  MCV 91.8 92.5  MCH 27.9 28.5  MCHC 30.4 30.9  RDW 14.3 14.1  PLT 189 211   Thyroid No results for input(s): "TSH", "FREET4" in the last 168 hours.  BNP Recent Labs  Lab 06/11/22 0916 06/12/22 0535 06/13/22 0821  BNP 1,132.1* 1,501.8* 1,077.2*    DDimer No results for input(s): "DDIMER" in the last 168 hours.   Radiology/Studies:  [facility] Chest Port 1 View  Result Date: 06/11/2022 CLINICAL DATA:  Shortness of breath. EXAM: PORTABLE CHEST 1 VIEW COMPARISON:  09/18/2020 FINDINGS: The cardio pericardial silhouette is enlarged.  Bibasilar collapse/consolidation, left greater than right with probable small left effusion. Interstitial markings are diffusely coarsened with chronic features. Bones are diffusely demineralized. Telemetry leads overlie the chest. IMPRESSION: Bibasilar collapse/consolidation, left greater than right with probable small left effusion. Electronically Signed   By: Kennith Center M.D.   On: 06/11/2022 09:16     Assessment and Plan:   Acute on Chronic Diastolic CHF Patient presented with acute shortness of breath. BNP markedly elevated at 1,132. Chest x-ray showed bibasilar collapse/ consolidate (left > right). Echo on 06/09/2022 just prior to admission showed LVEF of 50-55% with mild LVH, normal RV,  and mild dilatation of the ascending aorta measuring 41 mm.  She was started on IV Lasix. Net negative 2.2 L this admission. Renal function stable. Admission weight up 6lbs from office visit in 03/2022. No updated weights since then. - She still looks volume overloaded on exam.  - Will increase IV Lasix to 40mg  twice daily.  - Consider starting SLGT2 inhibitor. - Please monitor daily weights, strict I/Os, and renal function.  Paroxysmal Atrial Fibrillation Patient has a history of paroxysmal atrial fibrillation and was noted to be back in atrial fibrillation on admission. This may be contributing to CHF exacerbation. - Rates in the low 100s to 120s. - Continue Lopressor 100mg  twice daily.  - On Amiodarone 200mg  daily at home. Will increase Amiodarone to 200mg  twice daily for 1-2 weeks and then can decrease back down to 200mg  daily. Will discuss exact reload with MD. - Continue chronic anticoagulation with Eliquis 5mg  twice daily.   Hypertension BP reasonably well controlled.  - Continue Lopressor 100mg  twice daily. - Home Amlodipine and Hydralazine on hold well receiving IV diuresis. Would consider discharging on Spironolactone instead.   Hyperlipidemia - Continue Lipitor 40mg  daily.  CKD Stage  III Creatinine 1.00 today which seems to be around baseline.  - Continue to monitor closely with diuresis.  Otherwise, per primary team:    Risk Assessment/Risk Scores:    New York Heart Association (NYHA) Functional Class NYHA Class III  CHA2DS2-VASc Score = 6  This indicates a 9.7% annual risk of stroke. The patient's score is based upon: CHF History: 1 HTN History: 1 Diabetes History: 0 Stroke History: 0 Vascular Disease History: 1 (coronary atherosclerosis noted on prior CT in 05/2021) Age Score: 2 Gender Score: 1    For questions or updates, please contact [facility] Please consult [email or web address] for contact info under    Signed, Corrin Parker, PA-C  06/14/2022 11:59 AM

## 2022-06-14 NOTE — Progress Notes (Deleted)
Cardiology Clinic Note   Patient Name: Brenda Matthews Date of Encounter: 06/14/2022  Primary Care Provider:  Jeoffrey Massed, MD Primary Cardiologist:  Chrystie Nose, MD  Patient Profile    Brenda Matthews 87 year old female presents to the clinic today for follow-up evaluation of her essential hypertension and paroxysmal atrial fibrillation.  Past Medical History    Past Medical History:  Diagnosis Date   Abnormal EKG approx 2008   Nuclear stress test neg;    Acute pancreatitis 09/2020   idiopathic.  +panc psudocyst   Anxiety    with panic   CAP (community acquired pneumonia) 08/2017   Hospitalization for CAP/acute diast HF/rapid a-fib   Cataract    s/p surgery--lens implants   Chronic diastolic heart failure (HCC) 2019   Chronic renal insufficiency, stage 3 (moderate) (HCC) 12/04/2012   Renal u/s when in hosp 08/2017 for CAP/CHF showed symmetric kidneys, echogenicity normal, w/out hydronephrosis. Baseline GFR around 40 ml/min as of 10/2018.   DDD (degenerative disc disease), lumbar    Diverticulosis 2009   Fracture of radial shaft, left, closed 11/16/2010   fell down flight of stairs   History of kidney stones    Hx of adenomatous colonic polyps 2002;2009;2015   surveillance colonoscopy 2009, +polypectomy done-tubular adenoma w/out high grade.  05/2013 tubular adenomas--recall 3 yrs   Hyperlipidemia    Hypertension    Low TSH level 02/18/2016   T3 norm, T4 mildly elevated--suspected sick euthyroid syndrome.  Repeat labs 06/2016: normal.   Melanoma in situ (HCC) 06/2018   L LL   Osteoarthritis of both knees    viscosupplementation injections helpful 2020/21   Osteopenia    DEXA 08/2010; repeat DEXA 02/2015 worse: fosamax started.  06/2018 Dexa T score -2.4.  2020 maj osteop fx risk = 24%, Hip fx risk 7.3%. 09/2020 T score -2.1   PAF (paroxysmal atrial fibrillation) (HCC) 02/2016   when in post-op for ankle surgery; spontaneously converted in hosp, seen by Dr. Rennis Golden in  consultation--metoprolol rate control + xarelto recommended.  Metop d/c due to hypot.  Plan to cont xarelto 20 mg qd indef due to CHAD-VASc score of 3.  A-fib w/RVR and CHF 07/2017; pt placed on amiodarone and plan for CV, but pt was in sinus rhythm when she went in for her DC CV, so she was sent home.   Peripheral edema    Pneumonia 2015   hx with sepsis   Rheumatic fever    Past Surgical History:  Procedure Laterality Date   APPENDECTOMY  1966   done during surgery for tubal pregnancy   CATARACT EXTRACTION W/ INTRAOCULAR LENS IMPLANT  2013   bilat   COLONOSCOPY W/ POLYPECTOMY  05/2013   +diverticulosis; recall 3 yrs (Dr. Marina Goodell)   DEXA  02/2015; 06/2018   T score -2.1 in both femoral necks; FRAX 10 yr risk of major osteoporotic fracture was 21%---fosamax started. 06/2018 T score -2.4.  T score 09/2020 -2.1. Rpt 2 yrs.   ECTOPIC PREGNANCY SURGERY     EYE SURGERY     LEFT HEART CATH AND CORONARY ANGIOGRAPHY N/A 09/01/2017   No angiographically significant CAD.  Upper normal left ventricular filling pressure.  Procedure: LEFT HEART CATH AND CORONARY ANGIOGRAPHY;  Surgeon: Yvonne Kendall, MD;  Location: [facility];  Service: Cardiovascular;  Laterality: N/A;   LUMBAR LAMINECTOMY/DECOMPRESSION MICRODISCECTOMY N/A 09/26/2018   Procedure: Decompressive Lumbar Laminectomy L5 S1 FORAMINOTOMY L5 S1  NERVE ROOT BILATERALLY and Microdiscectomy L5-S1 Left;  Surgeon: Ranee Gosselin, MD;  Location: [facility];  Service: Orthopedics;  Laterality: N/A;    OPEN REDUCTION INTERNAL FIXATION (ORIF) TIBIA/FIBULA FRACTURE Left 02/18/2016   Procedure: OPEN REDUCTION INTERNAL FIXATION (ORIF) Right ankle trimalleolar fracture;  Surgeon: Toni Arthurs, MD;  Location: [facility] OR;  Service: Orthopedics;  Laterality: Left;  requests   ORIF RADIAL FRACTURE  11/18/2010   left; s/p slip on slippery floor and fell   THORACENTESIS  08/2017   diagnostic and therapeutic.  Transudative.  Clx neg.  (+pulm  edema/diastolic HF)   TONSILLECTOMY     TRANSTHORACIC ECHOCARDIOGRAM  02/18/2016; 08/10/17   LVEF of 55-60%, mild AI and mild MR and normal biatrial size.  07/2017--normal LV function, mild enlarge aortic root, mild/mod TR, bilat atrial enlargement.    Allergies  Allergies  Allergen Reactions   Augmentin [Amoxicillin-Pot Clavulanate] Nausea And Vomiting and Other (See Comments)    "projectile vomiting" Has patient had a PCN reaction causing immediate rash, facial/tongue/throat swelling, SOB or lightheadedness with hypotension:No Has patient had a PCN reaction causing severe rash involving mucus membranes or skin necrosis:No Has patient had a PCN reaction that required hospitalization:No Has patient had a PCN reaction occurring within the last 10 years:Yes If all of the above answers are "NO", then may proceed with Cephalosporin use.    Amoxicillin Rash   Clindamycin/Lincomycin Rash    History of Present Illness    Brenda Matthews has a PMH of HTN, PAF, chronic diastolic CHF, hyperlipidemia, anemia, anxiety, insomnia, DOE, status post thoracentesis, and edema.  She underwent cardiac catheterization 8/19 which showed normal coronaries.  She was seen by Dr. Rennis Golden 2/18 for new onset atrial fibrillation with RVR after induction with anesthesia for ankle fracture.  Her echocardiogram showed normal LV function.  She was initially placed on low-dose beta-blocker and Xarelto.  She was taken off beta-blocker due to low blood pressure and converted back to sinus rhythm.  CHA2DS2-VASc score 3.  She was advised to continue Xarelto.  She had a recurrent episode of atrial fibrillation with RVR 7/19.  Her echocardiogram at that time showed an LV function of 50-55%, mild MR, mild-moderate TR.  She was started on amiodarone and outpatient DCCV was scheduled.  On presentation for DCCV she was found to be in sinus rhythm.  She was seen in follow-up by Dr. Rennis Golden on 5/21.  During that time she continued to do  well from cardiac standpoint.  Her amiodarone was discontinued.  She was seen in follow-up by Marjie Skiff, PA-C on 09/24/2020.  At that time she reported that she had been admitted 09/14/2020 until 09/19/2020 with an acute episode of pancreatitis and pancreatic pseudocyst.  GI was consulted and felt conservative management was her best option.  She was also treated for acute UTI.  She had no cardiac issues during her admission.  She was discharged to [facility].  On follow-up she continues to do well from a cardiac standpoint.  She reported that during her hospitalization she received IV fluids and had significant lower extremity swelling.  Her left leg was noted to be chronically larger than her right.  She was elevating her legs and using compression stockings.  She noted occasional shortness of breath that would improve with inhaler use.  She denied orthopnea and PND.  She occasionally noted palpitations which would last for 2-3 minutes and resolved.  She denied lightheadedness, dizziness and syncope.  She presented to the clinic 09/06/21 for follow-up evaluation stated she feels well  today.  She occasionally noticed episodes of dizziness.  They dissipated without intervention.  She also noticed some mild increased work of breathing with walking long distances.  She did not notice increased shortness of breath with normal activities.  She had been going to the gym 3 times per week at [facility].  She has noticed some loose stools and will be following up with GI related to her pancreatitis.  Her ekg showed normal sinus rhythm 62 bpm.  I planned follow-up in 6 months.  She has a son that lives in [place].  She was seen in follow-up by Wallis Bamberg, NP-C 04/07/2022.  She was noted to be fluid volume overloaded.  Her furosemide was increased x 2 days.  Her lab work remained stable.  Follow-up was planned for 2 weeks.  She presented to the clinic for 1124 for follow-up evaluation and stated she was  concerned about her weight that morning.  She had been weighing herself at home and reported that her weight had been stable.  She was doing physical therapy.  She denied shortness of breath at rest.  She did note dyspnea on exertion.  We reviewed her recent lab work and she expressed understanding.  We reviewed the importance of avoiding salt and fluid restriction.  She had an upcoming echocardiogram.  She was noted to have ankle edema and weight increase.  I will doubled her furosemide x 3 days as well as her potassium for 3 days and then resume normal dosing.  I  ordered a BMP in 1 week and plan follow-up in 1 month.  She was seen in the emergency department 06/11/2022 with shortness of breath.  She was diagnosed with acute on chronic diastolic CHF.  She was noted to be in atrial fibrillation with a heart rate in the 80s-90s.  She was saturating 99% on room air.  She received IV furosemide and breathing treatments.  Her breathing improved.   She presents to the clinic today for follow-up evaluation and states***.   Today she denies chest pain,  lower extremity edema, fatigue, palpitations, melena, hematuria, hemoptysis, diaphoresis, weakness, presyncope, syncope, orthopnea, and PND.  Chronic diastolic CHF-Stable.  Denies increased shortness of breath.  Generalized bilateral lower extremity nonpitting edema.   Left leg chronically larger than right.  Echocardiogram 06/09/2022 showed LVEF of 50-55%, intermediate diastolic parameters, moderately dilated left atria and mildly dilated right atria.  She was noted to have mild-moderate mitral valve regurgitation and mild aortic valve regurgitation.  She was also noted to have mild dilation of the ascending aorta measuring 41 mm Continue hydralazine, metoprolol, furosemide Increase furosemide to 80 mg x 3***days then resume normal dosing, increase potassium to 40 mill equivalents twice daily then resume normal dosing Heart healthy low-sodium diet-salty 6  reviewed Increase physical activity as tolerated-continues to do group exercise at [employer]. Daily weights-weight log given Continue lower extremity support stockings Continue fluid restriction less than 50 ounces daily   Paroxysmal atrial fibrillation-heart rate today***65 .  Denies recent episodes of increased or accelerated heartbeat.  Beta-blocker previously stopped due to low blood pressure chest.  CHA2DS2-VASc score 5.  Denies bleeding issues. Avoid triggers Continue Eliquis, metoprolol Heart healthy low-sodium diet   Primary hypertension-BP today 132/82.   Continue current medication regimen Heart healthy low-sodium diet Maintain blood pressure log   CKD stage III-creatinine*** Follows with PCP   Disposition: Follow-up with Dr. Rennis Golden or APP in 2-3 months.   Home Medications    Prior to  Admission medications   Medication Sig Start Date End Date Taking? Authorizing Provider  acetaminophen (TYLENOL) 650 MG CR tablet Take 650 mg by mouth in the morning and at bedtime. Patient not taking: Reported on 05/06/2021    [provider]  albuterol (VENTOLIN HFA) 108 (90 Base) MCG/ACT inhaler INHALE 2 PUFFS BY MOUTH into THE lungs EVERY 4 HOURS AS NEEDED FOR WHEEZING OR SHORTNESS OF BREATH 08/25/21   McGowen, Maryjean Morn, MD  ALPRAZolam Prudy Feeler) 0.5 MG tablet 1 tab po tid prn anxiety 04/05/21   McGowen, Maryjean Morn, MD  amLODipine (NORVASC) 10 MG tablet TAKE 1 TABLET BY MOUTH EVERY DAY 07/27/21   McGowen, Maryjean Morn, MD  Apoaequorin (PREVAGEN PO) Take 1 tablet by mouth daily.    [provider]  Ascorbic Acid (VITAMIN C PO) Take by mouth daily.    [provider]  atorvastatin (LIPITOR) 40 MG tablet TAKE 1 TABLET BY MOUTH EVERY DAY 07/27/21   McGowen, Maryjean Morn, MD  Biotin 5000 MCG TABS Take 5,000 mcg by mouth daily.    [provider]  Calcium Carbonate (CALCIUM 600 PO) Take 1,200 mg by mouth daily.     [provider]  carboxymethylcellulose  (REFRESH PLUS) 0.5 % SOLN Place 2 drops into both eyes daily as needed (dry/irritated eyes.). Patient not taking: Reported on 05/06/2021    [provider]  Coenzyme Q10 200 MG capsule Take 200 mg by mouth daily.    [provider]  fluticasone (FLONASE) 50 MCG/ACT nasal spray Place 2 sprays into both nostrils daily.     [provider]  furosemide (LASIX) 40 MG tablet Take 1 tablet (40 mg total) by mouth daily. 08/27/20   McGowen, Maryjean Morn, MD  hydrALAZINE (APRESOLINE) 50 MG tablet TAKE 1 TABLET BY MOUTH 3 TIMES DAILY 07/27/21   McGowen, Maryjean Morn, MD  metoprolol tartrate (LOPRESSOR) 100 MG tablet TAKE 1 TABLET BY MOUTH 2 TIMES DAILY 04/28/21   Hilty, Lisette Abu, MD  Multiple Vitamins-Minerals (CENTRUM SILVER ULTRA WOMENS) TABS Take 1 tablet by mouth every evening.    [provider]  potassium chloride SA (KLOR-CON) 20 MEQ tablet TAKE 1 TABLET BY MOUTH 2 TIMES DAILY 10/14/20   Hilty, Lisette Abu, MD  Rivaroxaban (XARELTO) 15 MG TABS tablet TAKE 1 TABLET BY MOUTH EVERY DAY AT [facility] 08/03/21   Hilty, Lisette Abu, MD    Family History    Family History  Problem Relation Age of Onset   Heart disease Mother    Heart disease Father    Hypertension Brother    Diabetes Sister    Colon cancer Neg Hx    Pancreatic cancer Neg Hx    Rectal cancer Neg Hx    Stomach cancer Neg Hx    She indicated that her mother is deceased. She indicated that her father is deceased. She indicated that her sister is deceased. She indicated that the status of her brother is unknown. She indicated that the status of her neg hx is unknown.  Social History    Social History   Socioeconomic History   Marital status: Widowed    Spouse name: Not on file   Number of children: Not on file   Years of education: Not on file   Highest education level: Not on file  Occupational History   Not on file  Tobacco Use   Smoking status: Never   Smokeless tobacco: Never  Vaping Use   Vaping Use:  Never used  Substance and  Sexual Activity   Alcohol use: Yes    Comment: rarely   Drug use: Never   Sexual activity: Not on file  Other Topics Concern   Not on file  Social History Narrative   Widow, 2 sons.   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   No tobacco.  Rare alcohol.   No drugs.  Exercise: 4 times per week, about 4mi.   Social Determinants of Health   Financial Resource Strain: Low Risk  (10/27/2021)   Overall Financial Resource Strain (CARDIA)    Difficulty of Paying Living Expenses: Not hard at all  Food Insecurity: No Food Insecurity (06/11/2022)   Hunger Vital Sign    Worried About Running Out of Food in the Last Year: Never true    Ran Out of Food in the Last Year: Never true  Transportation Needs: No Transportation Needs (06/11/2022)   PRAPARE - Administrator, Civil Service (Medical): No    Lack of Transportation (Non-Medical): No  Physical Activity: Sufficiently Active (10/27/2021)   Exercise Vital Sign    Days of Exercise per Week: 5 days    Minutes of Exercise per Session: 30 min  Stress: No Stress Concern Present (10/27/2021)   [employer] of Occupational Health - Occupational Stress Questionnaire    Feeling of Stress : Not at all  Social Connections: Moderately Integrated (10/27/2021)   Social Connection and Isolation Panel [NHANES]    Frequency of Communication with Friends and Family: More than three times a week    Frequency of Social Gatherings with Friends and Family: More than three times a week    Attends Religious Services: More than 4 times per year    Active Member of [employer] or Organizations: Yes    Attends Banker Meetings: More than 4 times per year    Marital Status: Widowed  Intimate Partner Violence: Not At Risk (06/11/2022)   Humiliation, Afraid, Rape, and Kick questionnaire    Fear of Current or Ex-Partner: No    Emotionally Abused: No    Physically Abused: No    Sexually Abused: No     Review of Systems     General:  No chills, fever, night sweats or weight changes.  Cardiovascular:  No chest pain, dyspnea on exertion, edema, orthopnea, palpitations, paroxysmal nocturnal dyspnea. Dermatological: No rash, lesions/masses Respiratory: No cough, dyspnea Urologic: No hematuria, dysuria Abdominal:   No nausea, vomiting, diarrhea, bright red blood per rectum, melena, or hematemesis Neurologic:  No visual changes, wkns, changes in mental status. All other systems reviewed and are otherwise negative except as noted above.  Physical Exam    VS:  LMP  (LMP Unknown)  , BMI There is no height or weight on file to calculate BMI. GEN: Well nourished, well developed, in no acute distress. HEENT: normal. Neck: Supple, no JVD, carotid bruits, or masses. Cardiac: RRR, no murmurs, rubs, or gallops. No clubbing, cyanosis, bilateral 1+ ankle edema.  Radials/DP/PT 2+ and equal bilaterally.  Respiratory:  Respirations regular and unlabored, clear to auscultation bilaterally. GI: Soft, nontender, nondistended, BS + x 4. MS: no deformity or atrophy. Skin: warm and dry, no rash. Neuro:  Strength and sensation are intact. Psych: Normal affect.  Accessory Clinical Findings    Recent Labs: 06/11/2022: ALT 30 06/12/2022: Hemoglobin 12.1; Platelets 211 06/13/2022: B Natriuretic Peptide 1,077.2 06/14/2022: BUN 30; Creatinine, Ser 1.00; Magnesium 2.1; Potassium 3.7; Sodium 136   Recent Lipid Panel    Component Value Date/Time   CHOL 144 01/05/2022  1001   TRIG 113.0 01/05/2022 1001   HDL 60.90 01/05/2022 1001   CHOLHDL 2 01/05/2022 1001   VLDL 22.6 01/05/2022 1001   LDLCALC 61 01/05/2022 1001   LDLDIRECT 66.0 09/10/2018 0913    ECG personally reviewed by me today-none today.  06/14/2022 sinus rhythm anterior infarct undetermined age 40 bpm.  EKG 09/06/2021 normal sinus rhythm left axis deviation anterior infarct undetermined age 56 bpm- No acute changes  Echocardiogram 08/10/2017  Study Conclusions   -  Left ventricle: The cavity size was normal. Wall thickness was    increased in a pattern of mild LVH. Systolic function was normal.    The estimated ejection fraction was in the range of 50% to 55%.    Wall motion was normal; there were no regional wall motion    abnormalities.  - Aortic valve: There was trivial regurgitation.  - Aortic root: The aortic root was mildly dilated.  - Mitral valve: There was mild regurgitation.  - Left atrium: The atrium was severely dilated.  - Right atrium: The atrium was mildly dilated.  - Tricuspid valve: There was mild-moderate regurgitation.  - Pulmonary arteries: PA peak pressure: 32 mm Hg (S).  - Pericardium, extracardiac: A trivial pericardial effusion was    identified.   Impressions:   - Normal LV systolic function; mild LVH; trace AI; mildly dilated    aortic root; mild MR; biatrial enlargement; mild to moderate TR.  Echocardiogram 06/09/2022  IMPRESSIONS     1. Left ventricular ejection fraction, by estimation, is 50 to 55%. The  left ventricle has low normal function. Left ventricular endocardial  border not optimally defined to evaluate regional wall motion. There is  mild concentric left ventricular  hypertrophy. Left ventricular diastolic parameters are indeterminate.   2. Right ventricular systolic function is normal. The right ventricular  size is normal.   3. Left atrial size was moderately dilated.   4. Right atrial size was mildly dilated.   5. The mitral valve is grossly normal. Mild to moderate mitral valve  regurgitation.   6. The aortic valve is tricuspid. There is mild calcification of the  aortic valve. There is mild thickening of the aortic valve. Aortic valve  regurgitation is mild. Aortic valve sclerosis/calcification is present,  without any evidence of aortic  stenosis.   7. Aortic dilatation noted. There is mild dilatation of the ascending  aorta, measuring 41 mm.   Comparison(s): No significant change from  prior study.   FINDINGS   Left Ventricle: Left ventricular ejection fraction, by estimation, is 50  to 55%. The left ventricle has low normal function. Left ventricular  endocardial border not optimally defined to evaluate regional wall motion.  The left ventricular internal cavity   size was normal in size. There is mild concentric left ventricular  hypertrophy. Left ventricular diastolic parameters are indeterminate.   Right Ventricle: The right ventricular size is normal. No increase in  right ventricular wall thickness. Right ventricular systolic function is  normal.   Left Atrium: Left atrial size was moderately dilated.   Right Atrium: Right atrial size was mildly dilated.   Pericardium: There is no evidence of pericardial effusion.   Mitral Valve: The mitral valve is grossly normal. Mild to moderate mitral  valve regurgitation.   Tricuspid Valve: The tricuspid valve is normal in structure. Tricuspid  valve regurgitation is mild.   Aortic Valve: The aortic valve is tricuspid. There is mild calcification  of the aortic valve. There is mild thickening  of the aortic valve. Aortic  valve regurgitation is mild. Aortic valve sclerosis/calcification is  present, without any evidence of  aortic stenosis.   Pulmonic Valve: The pulmonic valve was normal in structure. Pulmonic valve  regurgitation is trivial.   Aorta: Aortic dilatation noted. There is mild dilatation of the ascending  aorta, measuring 41 mm.   IAS/Shunts: The atrial septum is grossly normal.   Assessment & Plan   1.***   Thomasene Ripple. Rogina Schiano NP-C     06/14/2022, 8:13 AM [facility] [address] Office [phone] Fax [phone]  Notice: This dictation was prepared with Dragon dictation along with smaller phrase technology. Any transcriptional errors that result from this process are unintentional and may not be corrected upon review.  I spent 14*** minutes  examining this patient, reviewing medications, and using patient centered shared decision making involving her cardiac care.  Prior to her visit I spent greater than 20 minutes reviewing her past medical history,  medications, and prior cardiac tests.

## 2022-06-14 NOTE — TOC Initial Note (Addendum)
Transition of Care [facility]) - Initial/Assessment Note    Patient Details  Name: Brenda Matthews MRN: [id number] Date of Birth: [birth year]  Transition of Care [facility]) CM/SW Contact:    Lanier Clam, RN Phone Number: 06/14/2022, 11:38 AM  Clinical Narrative: patient from [facility] living-confirmed with Adm coordin Dru-can go to [facility]-faxed out will start auth.MD updated.   -12p initiated auth for [facility] NW#[id number] auth, & medical stability.                Expected Discharge Plan: Skilled Nursing Facility Barriers to Discharge: Continued Medical Work up   Patient Goals and CMS Choice   CMS Medicare.gov Compare Post Acute Care list provided to:: Patient Represenative (must comment) Choice offered to / list presented to : Patient [place] ownership interest in [facility].provided to:: Patient    Expected Discharge Plan and Services   Discharge Planning Services: CM Consult Post Acute Care Choice: Skilled Nursing Facility Living arrangements for the past 2 months: Independent Living Facility                                      Prior Living Arrangements/Services Living arrangements for the past 2 months: Independent Living Facility Lives with:: Facility Resident Patient language and need for interpreter reviewed:: Yes Do you feel safe going back to the place where you live?: Yes      Need for Family Participation in Patient Care: Yes (Comment) Care giver support system in place?: Yes (comment) Current home services: DME [facility]) Criminal Activity/Legal Involvement Pertinent to Current Situation/Hospitalization: No - Comment as needed  Activities of Daily Living Home Assistive Devices/Equipment: Cane (specify quad or straight) ADL Screening (condition at time of admission) Patient's cognitive ability adequate to safely complete daily activities?: Yes Is the patient deaf or have difficulty hearing?: No Does the patient have  difficulty seeing, even when wearing glasses/contacts?: No Does the patient have difficulty concentrating, remembering, or making decisions?: No Patient able to express need for assistance with ADLs?: Yes Does the patient have difficulty dressing or bathing?: No Independently performs ADLs?: Yes (appropriate for developmental age) Does the patient have difficulty walking or climbing stairs?: No Weakness of Legs: None Weakness of Arms/Hands: None  Permission Sought/Granted Permission sought to share information with : Case Manager Permission granted to share information with : Yes, Verbal Permission Granted  Share Information with NAME: Case manager           Emotional Assessment Appearance:: Appears stated age Attitude/Demeanor/Rapport: Gracious Affect (typically observed): Accepting Orientation: : Oriented to Self, Oriented to Place, Oriented to  Time, Oriented to Situation Alcohol / Substance Use: Not Applicable Psych Involvement: No (comment)  Admission diagnosis:  Shortness of breath [R06.02] Acute on chronic diastolic (congestive) heart failure (HCC) [I50.33] Congestive heart failure, unspecified HF chronicity, unspecified heart failure type (HCC) [I50.9] Patient Active Problem List   Diagnosis Date Noted   Acute on chronic diastolic (congestive) heart failure (HCC) 06/11/2022   Idiopathic acute pancreatitis without necrosis or infection 09/14/2020   Pancreatic cyst 09/14/2020   Pancreatic duct dilated 09/14/2020   Pure hypercholesterolemia 09/14/2020   Hypertensive heart and chronic kidney disease with heart failure and stage 1 through stage 4 chronic kidney disease, or unspecified chronic kidney disease (HCC) 09/28/2018   Spinal stenosis, lumbar region with neurogenic claudication 09/26/2018   Osteoarthritis of right knee 11/21/2017   Acute on chronic diastolic CHF (congestive  heart failure) (HCC)    S/P thoracentesis    Acute respiratory failure (HCC)    Rash in adult  08/30/2017   Atrial fibrillation and flutter (HCC)    DOE (dyspnea on exertion) 08/29/2017   Arterial hypotension    Chronic anticoagulation 12/19/2016   Low TSH level 02/19/2016   PAF (paroxysmal atrial fibrillation) (HCC) 02/18/2016   Ankle fracture, left-s/p surgery 02/18/16 02/18/2016   Closed displaced trimalleolar fracture of left ankle 02/18/2016   Acute bronchitis 02/10/2015   Bronchopneumonia 12/02/2013   Chronic kidney disease, stage 3b (HCC) 12/04/2012   Hx of adenomatous colonic polyps    Routine general medical examination at a health care facility 09/05/2011   Cervical cancer screening 02/24/2011   SCIATICA, BILATERAL 09/01/2009   SACROILIAC STRAIN, ACUTE 09/01/2009   ANEMIA 11/07/2007   Osteopenia of the elderly 11/07/2007   INSOMNIA 11/07/2007   EDEMA 08/14/2007   Hyperlipidemia 04/04/2007   PANIC DISORDER 04/04/2007   Essential hypertension 04/04/2007   Anxiety state 12/27/2006   ARTHRITIS 12/27/2006   PCP:  Jeoffrey Massed, MD Pharmacy:   [facility] [place], [place] - [address] [place] [address] Phone: [phone] Fax: [phone]  [facility] [place], [place] - [address] [place] [address] Phone: [phone] Fax: [phone]     Social Determinants of Health (SDOH) Social History: SDOH Screenings   Food Insecurity: No Food Insecurity (06/11/2022)  Housing: Patient Declined (06/11/2022)  Transportation Needs: No Transportation Needs (06/11/2022)  Utilities: Not At Risk (06/11/2022)  Alcohol Screen: Low Risk  (10/11/2020)  Depression (PHQ2-9): Low Risk  (10/27/2021)  Financial Resource Strain: Low Risk  (10/27/2021)  Physical Activity: Sufficiently Active (10/27/2021)  Social Connections: Moderately Integrated (10/27/2021)  Stress: No Stress Concern Present (10/27/2021)  Tobacco Use: Low Risk  (06/11/2022)   SDOH Interventions:     Readmission Risk Interventions      No data to display

## 2022-06-14 NOTE — Progress Notes (Signed)
Progress Note    Brenda Matthews   JWJ:[id number]  DOB: 28-Feb-1935  DOA: 06/11/2022     2 PCP: Jeoffrey Massed, MD  Initial CC: SOB, LE swelling  Hospital Course: [name] is an 87 yo female with PMH chronic dCHF, CKD3b, HTN, HLD who presented with worsening shortness of breath and lower extremity swelling.  She has been followed by cardiology previously and required increased Lasix dosing when developing similar symptoms in April.  She tried using albuterol for her shortness of breath with no improvement but did not increase any Lasix dosing.  Due to persistent SOB, she called for EMS. She was found to be hypoxic, 84% on room air and was started on oxygen. She has known A-fib and is rhythm unaware.  Rate was controlled during workup. BNP was elevated 1,132 and CXR showed bibasilar consolidations with underlying effusion.  She had worsened lower extremity edema as well.  She was started on IV Lasix and admitted for further treatment for CHF exacerbation.  Interval History:  Continues to diurese on Lasix but still having quite a bit of lower extremity edema.  Updated her son this afternoon as well per her request.  They wish for her to go to rehab at [facility] at discharge.  This is being worked on. Still needs further diuresis and cardiology following as well.  Assessment and Plan: * Acute on chronic diastolic CHF (congestive heart failure) (HCC) - presented with orthopnea, pulmonary crackles, pulmonary edema, B/L LE edema, SOB/DOE, pleural effusion  - BNP 1,501 initially - started on IV lasix on admission - trend BMP while on diuresis - strict I&O - cardiology now following, appreciate assistance; diuresis has been adjusted   PAF (paroxysmal atrial fibrillation) (HCC) - rate controlled -Continue amiodarone, metoprolol, and Eliquis - amio re-load ordered per cardiology on 5/28  Chronic kidney disease, stage 3b (HCC) - patient has history of CKD3b. Baseline creat ~ 1.1 - 1.2,  eGFR~ 43-45 -At baseline.  Monitor BMP while on diuresis  Physical deconditioning - Patient and son preferring for rehab at discharge.  She does not feel strong enough for returning to independent living - Evaluated by PT as well and in agreement - Preference is rehab at [facility] once she is cleared medically  Pure hypercholesterolemia - Continue Lipitor  Essential hypertension - Hold amlodipine and hydralazine in setting of increased Lasix   Old records reviewed in assessment of this patient  Antimicrobials:   DVT prophylaxis:  SCDs Start: 06/11/22 1224 apixaban (ELIQUIS) tablet 5 mg   Code Status:   Code Status: Full Code  Mobility Assessment (last 72 hours)     Mobility Assessment     Row Name 06/14/22 1033 06/14/22 0750 06/13/22 1930 06/13/22 0800 06/12/22 1915   Does patient have an order for bedrest or is patient medically unstable -- No - Continue assessment No - Continue assessment No - Continue assessment No - Continue assessment   What is the highest level of mobility based on the progressive mobility assessment? Level 5 (Walks with assist in room/hall) - Balance while stepping forward/back and can walk in room with assist - Complete Level 6 (Walks independently in room and hall) - Balance while walking in room without assist - Complete Level 5 (Walks with assist in room/hall) - Balance while stepping forward/back and can walk in room with assist - Complete Level 5 (Walks with assist in room/hall) - Balance while stepping forward/back and can walk in room with assist - Complete  Level 5 (Walks with assist in room/hall) - Balance while stepping forward/back and can walk in room with assist - Complete    Row Name 06/11/22 2128 06/11/22 1434         Does patient have an order for bedrest or is patient medically unstable No - Continue assessment No - Continue assessment      What is the highest level of mobility based on the progressive mobility assessment? Level 4  (Walks with assist in room) - Balance while marching in place and cannot step forward and back - Complete Level 4 (Walks with assist in room) - Balance while marching in place and cannot step forward and back - Complete               Barriers to discharge: none Disposition Plan:  Home Status is: Inpatient  Objective: Blood pressure 127/86, pulse (!) 108, temperature 98 F (36.7 C), temperature source Oral, resp. rate 20, height 5\' 1"  (1.549 m), weight 84.8 kg, SpO2 93 %.  Examination:  Physical Exam Constitutional:      Appearance: Normal appearance.  HENT:     Head: Normocephalic and atraumatic.     Mouth/Throat:     Mouth: Mucous membranes are moist.  Eyes:     Extraocular Movements: Extraocular movements intact.  Cardiovascular:     Rate and Rhythm: Normal rate. Rhythm irregular.  Pulmonary:     Effort: Pulmonary effort is normal. No respiratory distress.     Breath sounds: Rales present.  Abdominal:     General: Bowel sounds are normal. There is no distension.     Palpations: Abdomen is soft.     Tenderness: There is no abdominal tenderness.  Musculoskeletal:        General: Swelling (2-3+ B/L LE) present. Normal range of motion.     Cervical back: Normal range of motion and neck supple.  Skin:    General: Skin is warm and dry.  Neurological:     General: No focal deficit present.     Mental Status: She is alert.  Psychiatric:        Mood and Affect: Mood normal.      Consultants:  Cardiology  Procedures:    Data Reviewed: Results for orders placed or performed during the hospital encounter of 06/11/22 (from the past 24 hour(s))  Basic metabolic panel     Status: Abnormal   Collection Time: 06/14/22  5:17 AM  Result Value Ref Range   Sodium 136 135 - 145 mmol/L   Potassium 3.7 3.5 - 5.1 mmol/L   Chloride 107 98 - 111 mmol/L   CO2 23 22 - 32 mmol/L   Glucose, Bld 104 (H) 70 - 99 mg/dL   BUN 30 (H) 8 - 23 mg/dL   Creatinine, Ser 1.61 0.44 - 1.00 mg/dL    Calcium 8.4 (L) 8.9 - 10.3 mg/dL   GFR, Estimated 55 (L) >60 mL/min   Anion gap 6 5 - 15  Magnesium     Status: None   Collection Time: 06/14/22  5:17 AM  Result Value Ref Range   Magnesium 2.1 1.7 - 2.4 mg/dL    I have reviewed pertinent nursing notes, vitals, labs, and images as necessary. I have ordered labwork to follow up on as indicated.  I have reviewed the last notes from staff over past 24 hours. I have discussed patient's care plan and test results with nursing staff, CM/SW, and other staff as appropriate.  Time spent: Greater than 50%  of the 55 minute visit was spent in counseling/coordination of care for the patient as laid out in the A&P.   LOS: 2 days   Lewie Chamber, MD [facility] 06/14/2022, 1:16 PM

## 2022-06-14 NOTE — [place] FL2 (Signed)
[place] MEDICAID FL2 LEVEL OF CARE FORM     IDENTIFICATION  Patient Name: Brenda Matthews Birthdate: [birth year] Sex: female Admission Date (Current Location): 06/11/2022  [facility] and [place] Number:  Producer, television/film/video and Address:  [facility],  [address] [place], [place] [address]      Provider Number: [id number]  Attending Physician Name and Address:  Lewie Chamber, MD  Relative Name and Phone Number:  Theodoro Grist Younan(Son) [phone]    Current Level of Care: Hospital Recommended Level of Care:   Prior Approval Number:    Date Approved/Denied:   PASRR Number: [id number]  Discharge Plan: SNF    Current Diagnoses: Patient Active Problem List   Diagnosis Date Noted   Acute on chronic diastolic (congestive) heart failure (HCC) 06/11/2022   Idiopathic acute pancreatitis without necrosis or infection 09/14/2020   Pancreatic cyst 09/14/2020   Pancreatic duct dilated 09/14/2020   Pure hypercholesterolemia 09/14/2020   Hypertensive heart and chronic kidney disease with heart failure and stage 1 through stage 4 chronic kidney disease, or unspecified chronic kidney disease (HCC) 09/28/2018   Spinal stenosis, lumbar region with neurogenic claudication 09/26/2018   Osteoarthritis of right knee 11/21/2017   Acute on chronic diastolic CHF (congestive heart failure) (HCC)    S/P thoracentesis    Acute respiratory failure (HCC)    Rash in adult 08/30/2017   Atrial fibrillation and flutter (HCC)    DOE (dyspnea on exertion) 08/29/2017   Arterial hypotension    Chronic anticoagulation 12/19/2016   Low TSH level 02/19/2016   PAF (paroxysmal atrial fibrillation) (HCC) 02/18/2016   Ankle fracture, left-s/p surgery 02/18/16 02/18/2016   Closed displaced trimalleolar fracture of left ankle 02/18/2016   Acute bronchitis 02/10/2015   Bronchopneumonia 12/02/2013   Chronic kidney disease, stage 3b (HCC) 12/04/2012   Hx of adenomatous colonic polyps    Routine general medical  examination at a health care facility 09/05/2011   Cervical cancer screening 02/24/2011   SCIATICA, BILATERAL 09/01/2009   SACROILIAC STRAIN, ACUTE 09/01/2009   ANEMIA 11/07/2007   Osteopenia of the elderly 11/07/2007   INSOMNIA 11/07/2007   EDEMA 08/14/2007   Hyperlipidemia 04/04/2007   PANIC DISORDER 04/04/2007   Essential hypertension 04/04/2007   Anxiety state 12/27/2006   ARTHRITIS 12/27/2006    Orientation RESPIRATION BLADDER Height & Weight     Self, Time, Situation, Place  O2 Continent Weight: 84.8 kg Height:  5\' 1"  (154.9 cm)  BEHAVIORAL SYMPTOMS/MOOD NEUROLOGICAL BOWEL NUTRITION STATUS      Continent Diet (Heart Healthy)  AMBULATORY STATUS COMMUNICATION OF NEEDS Skin   Limited Assist Verbally Normal                       Personal Care Assistance Level of Assistance  Bathing, Feeding, Dressing Bathing Assistance: Limited assistance Feeding assistance: Limited assistance Dressing Assistance: Limited assistance     Functional Limitations Info  Sight, Hearing, Speech Sight Info: Impaired (eyeglasses) Hearing Info: Adequate Speech Info: Adequate    SPECIAL CARE FACTORS FREQUENCY  PT (By licensed PT), OT (By licensed OT)     PT Frequency: 5x week OT Frequency: 5x week            Contractures Contractures Info: Not present    Additional Factors Info  Code Status, Allergies Code Status Info: Full Allergies Info: Augmentin (Amoxicillin-pot Clavulanate), Amoxicillin, Clindamycin/lincomycin           Current Medications (06/14/2022):  This is the current hospital active  medication list Current Facility-Administered Medications  Medication Dose Route Frequency Provider Last Rate Last Admin   acetaminophen (TYLENOL) tablet 650 mg  650 mg Oral Q6H PRN Kirby Crigler, Mir M, MD   650 mg at 06/12/22 2342   Or   acetaminophen (TYLENOL) suppository 650 mg  650 mg Rectal Q6H PRN Kirby Crigler, Mir M, MD       albuterol (PROVENTIL) (2.5 MG/3ML) 0.083% nebulizer  solution 2.5 mg  2.5 mg Nebulization Q2H PRN Kirby Crigler, Mir M, MD   2.5 mg at 06/13/22 2055   ALPRAZolam Prudy Feeler) tablet 0.5 mg  0.5 mg Oral TID PRN Lewie Chamber, MD   0.5 mg at 06/13/22 2118   amiodarone (PACERONE) tablet 200 mg  200 mg Oral Daily Lewie Chamber, MD   200 mg at 06/14/22 1052   apixaban (ELIQUIS) tablet 5 mg  5 mg Oral BID Kirby Crigler, Mir M, MD   5 mg at 06/14/22 1052   atorvastatin (LIPITOR) tablet 40 mg  40 mg Oral Daily Kirby Crigler, Mir M, MD   40 mg at 06/14/22 1052   [START ON 06/15/2022] furosemide (LASIX) tablet 40 mg  40 mg Oral Daily Lewie Chamber, MD       hydrALAZINE (APRESOLINE) injection 5 mg  5 mg Intravenous Q6H PRN Kirby Crigler, Mir M, MD       metoprolol tartrate (LOPRESSOR) tablet 100 mg  100 mg Oral BID Kirby Crigler, Mir M, MD   100 mg at 06/14/22 1051   Muscle Rub CREA   Topical PRN Maryln Gottron, MD   1 Application at 06/12/22 0810   ondansetron (ZOFRAN) tablet 4 mg  4 mg Oral Q6H PRN Kirby Crigler, Mir M, MD       Or   ondansetron [facility]) injection 4 mg  4 mg Intravenous Q6H PRN Kirby Crigler, Mir M, MD       potassium chloride (KLOR-CON) packet 20 mEq  20 mEq Oral BID Kirby Crigler, Mir M, MD   20 mEq at 06/14/22 1056   traZODone (DESYREL) tablet 25 mg  25 mg Oral QHS PRN Maryln Gottron, MD   25 mg at 06/12/22 2341     Discharge Medications: Please see discharge summary for a list of discharge medications.  Relevant Imaging Results:  Relevant Lab Results:   Additional Information SS#[phone]  Sharine Cadle, Olegario Messier, RN

## 2022-06-14 NOTE — Plan of Care (Signed)
  Problem: Education: Goal: Knowledge of General Education information will improve Description: Including pain rating scale, medication(s)/side effects and non-pharmacologic comfort measures Outcome: Adequate for Discharge   Problem: Health Behavior/Discharge Planning: Goal: Ability to manage health-related needs will improve Outcome: Adequate for Discharge   Problem: Clinical Measurements: Goal: Ability to maintain clinical measurements within normal limits will improve Outcome: Adequate for Discharge   Problem: Elimination: Goal: Will not experience complications related to bowel motility Outcome: Adequate for Discharge Goal: Will not experience complications related to urinary retention Outcome: Adequate for Discharge

## 2022-06-15 ENCOUNTER — Ambulatory Visit: Payer: Medicare PPO | Admitting: General Practice

## 2022-06-15 DIAGNOSIS — R0602 Shortness of breath: Secondary | ICD-10-CM | POA: Diagnosis not present

## 2022-06-15 DIAGNOSIS — N1832 Chronic kidney disease, stage 3b: Secondary | ICD-10-CM | POA: Diagnosis not present

## 2022-06-15 DIAGNOSIS — I480 Paroxysmal atrial fibrillation: Secondary | ICD-10-CM | POA: Diagnosis not present

## 2022-06-15 DIAGNOSIS — I509 Heart failure, unspecified: Secondary | ICD-10-CM

## 2022-06-15 DIAGNOSIS — I5033 Acute on chronic diastolic (congestive) heart failure: Secondary | ICD-10-CM | POA: Diagnosis not present

## 2022-06-15 DIAGNOSIS — I10 Essential (primary) hypertension: Secondary | ICD-10-CM | POA: Diagnosis not present

## 2022-06-15 LAB — BASIC METABOLIC PANEL
Anion gap: 7 (ref 5–15)
BUN: 36 mg/dL — ABNORMAL HIGH (ref 8–23)
CO2: 26 mmol/L (ref 22–32)
Calcium: 8.9 mg/dL (ref 8.9–10.3)
Chloride: 108 mmol/L (ref 98–111)
Creatinine, Ser: 1.07 mg/dL — ABNORMAL HIGH (ref 0.44–1.00)
GFR, Estimated: 51 mL/min — ABNORMAL LOW (ref >60)
Glucose, Bld: 110 mg/dL — ABNORMAL HIGH (ref 70–99)
Potassium: 4 mmol/L (ref 3.5–5.1)
Sodium: 141 mmol/L (ref 135–145)

## 2022-06-15 LAB — MAGNESIUM: Magnesium: 2.2 mg/dL (ref 1.7–2.4)

## 2022-06-15 NOTE — Progress Notes (Signed)
Mobility Specialist - Progress Note   06/15/22 1440  Oxygen Therapy  SpO2 90 %  O2 Device Nasal Cannula  O2 Flow Rate (L/min) 2 L/min  Patient Activity (if Appropriate) Ambulating  Mobility  Activity Ambulated independently in hallway  Level of Assistance Independent  Assistive Device None  Distance Ambulated (ft) 250 ft  Activity Response Tolerated well  Mobility Referral Yes  $Mobility charge 1 Mobility  Mobility Specialist Start Time (ACUTE ONLY) 0222  Mobility Specialist Stop Time (ACUTE ONLY) 0237  Mobility Specialist Time Calculation (min) (ACUTE ONLY) 15 min   Pt received in bed and agreeable to mobility. C/o feeling SOB but stated she was okay. Pt to EOB after session with all needs met.    Pre-mobility: 112 HR, 90% SpO2 (2L [place]) During mobility: 107 HR, 90% SpO2 (2L [place]) Post-mobility: 97 HR, 90% SPO2 (2L [place])  Chief Technology Officer

## 2022-06-15 NOTE — Progress Notes (Signed)
[facility] PROGRESS NOTE    Progress Note  SHYNE KONCZAL  ZOX:[id number] DOB: [birth year] DOA: 06/11/2022 PCP: Jeoffrey Massed, MD     Brief Narrative:   Brenda Matthews is an 87 y.o. female past medical history of chronic diastolic heart failure, chronic kidney disease stage IIIb, paroxysmal atrial fibrillation essential hypertension presented with worsening shortness of breath and lower extremity edema was found hypoxic in the ED being treated for acute diastolic heart failure and paroxysmal atrial fibrillation    Assessment/Plan:   Acute respiratory failure with hypoxia secondary to acute on chronic diastolic CHF (congestive heart failure) (HCC) BNP over thousand hypoxic on admission. Started on IV Lasix, cardiology has been consulted. Is negative about 2.8 L, creatinine has remained stable as well with potassium. Spironolactone added  Paroxysmal atrial fibrillation: Rate controlled on amiodarone metoprolol and Eliquis. Cardiology recommended and Amio reload Cardiology on board might need cardioversion  Chronic kidney disease stage IIIb: With a baseline creatinine of 1.1-1.2 has remained at baseline.  Physical deconditioning: PT evaluated the patient, she will need skilled nursing facility.  Hyperlipidemia: Continue Lipitor.   Central hypertension: Holding amlodipine and hydralazine    DVT prophylaxis: Eliquis Family Communication:none Status is: Inpatient Remains inpatient appropriate because: Acute respiratory failure with hypoxia due to acute diastolic heart failure    Code Status:     Code Status Orders  (From admission, onward)           Start     Ordered   06/11/22 1224  Full code  Continuous       Question:  By:  Answer:  Consent: discussion documented in EHR   06/11/22 1225           Code Status History     Date Active Date Inactive Code Status Order ID Comments User Context   09/14/2020 0945 09/20/2020 0204 Full Code [id number]   Marlow Baars, MD Inpatient   09/26/2018 1310 09/27/2018 1831 Full Code [id number]  Ranee Gosselin, MD Inpatient   08/29/2017 1438 09/11/2017 2152 Full Code [id number]  Gwenyth Bender, NP ED   02/18/2016 1952 02/20/2016 2005 Full Code [id number]  Toni Arthurs, MD Inpatient      Advance Directive Documentation    Flowsheet Row Most Recent Value  Type of Advance Directive Healthcare Power of Attorney  Pre-existing out of facility DNR order (yellow form or pink MOST form) --  "MOST" Form in Place? --         IV Access:   Peripheral IV   Procedures and diagnostic studies:   No results found.   Medical Consultants:   None.   Subjective:    BAMMA ANGELO relates her breathing is better  Objective:    Vitals:   06/15/22 0513 06/15/22 0513 06/15/22 1015 06/15/22 1057  BP: (!) 128/95 (!) 128/95  121/81  Pulse: 95 (!) 107  80  Resp: 17 17    Temp:  98.3 F (36.8 C)    TempSrc:  Oral    SpO2: 96% 95% (!) 84%   Weight:      Height:       SpO2: (!) 84 % O2 Flow Rate (L/min): 2 L/min   Intake/Output Summary (Last 24 hours) at 06/15/2022 1122 Last data filed at 06/15/2022 0700 Gross per 24 hour  Intake 460 ml  Output 975 ml  Net -515 ml   Filed Weights   06/11/22 0818 06/11/22 1434  Weight: 84.1 kg 84.8 kg    Exam: General  exam: In no acute distress. Respiratory system: Good air movement and clear to auscultation. Cardiovascular system: S1 & S2 heard, RRR.  Positive JVD Gastrointestinal system: Abdomen is nondistended, soft and nontender.  Extremities: 3+ edema Skin: No rashes, lesions or ulcers Psychiatry: Judgement and insight appear normal. Mood & affect appropriate.    Data Reviewed:    Labs: Basic Metabolic Panel: Recent Labs  Lab 06/11/22 0916 06/12/22 0535 06/13/22 0821 06/14/22 0517 06/15/22 0506  NA 141 138 139 136 141  K 3.9 4.3 3.9 3.7 4.0  CL 110 108 107 107 108  CO2 21* 21* 23 23 26   GLUCOSE 120* 171* 128* 104* 110*  BUN 27* 33* 33*  30* 36*  CREATININE 0.94 1.29* 1.07* 1.00 1.07*  CALCIUM 8.8* 8.9 8.8* 8.4* 8.9  MG  --   --  2.2 2.1 2.2   GFR Estimated Creatinine Clearance: 38 mL/min (A) (by C-G formula based on SCr of 1.07 mg/dL (H)). Liver Function Tests: Recent Labs  Lab 06/11/22 0916  AST 33  ALT 30  ALKPHOS 84  BILITOT 0.8  PROT 6.5  ALBUMIN 3.5   No results for input(s): "LIPASE", "AMYLASE" in the last 168 hours. No results for input(s): "AMMONIA" in the last 168 hours. Coagulation profile No results for input(s): "INR", "PROTIME" in the last 168 hours. COVID-19 Labs  No results for input(s): "DDIMER", "FERRITIN", "LDH", "CRP" in the last 72 hours.  Lab Results  Component Value Date   SARSCOV2NAA NEGATIVE 09/19/2020   SARSCOV2NAA NEGATIVE 09/14/2020   SARSCOV2NAA NOT DETECTED 09/22/2018    CBC: Recent Labs  Lab 06/11/22 0916 06/12/22 0535  WBC 8.1 7.9  NEUTROABS 6.1  --   HGB 11.9* 12.1  HCT 39.1 39.2  MCV 91.8 92.5  PLT 189 211   Cardiac Enzymes: No results for input(s): "CKTOTAL", "CKMB", "CKMBINDEX", "TROPONINI" in the last 168 hours. BNP (last 3 results) No results for input(s): "PROBNP" in the last 8760 hours. CBG: No results for input(s): "GLUCAP" in the last 168 hours. D-Dimer: No results for input(s): "DDIMER" in the last 72 hours. Hgb A1c: No results for input(s): "HGBA1C" in the last 72 hours. Lipid Profile: No results for input(s): "CHOL", "HDL", "LDLCALC", "TRIG", "CHOLHDL", "LDLDIRECT" in the last 72 hours. Thyroid function studies: No results for input(s): "TSH", "T4TOTAL", "T3FREE", "THYROIDAB" in the last 72 hours.  Invalid input(s): "FREET3" Anemia work up: No results for input(s): "VITAMINB12", "FOLATE", "FERRITIN", "TIBC", "IRON", "RETICCTPCT" in the last 72 hours. Sepsis Labs: Recent Labs  Lab 06/11/22 0916 06/12/22 0535  WBC 8.1 7.9   Microbiology No results found for this or any previous visit (from the past 240 hour(s)).   Medications:     amiodarone  200 mg Oral BID   apixaban  5 mg Oral BID   atorvastatin  40 mg Oral Daily   furosemide  40 mg Intravenous BID   metoprolol tartrate  100 mg Oral BID   potassium chloride  20 mEq Oral BID   Continuous Infusions:    LOS: 3 days   Marinda Elk  [facility]  06/15/2022, 11:22 AM

## 2022-06-15 NOTE — Progress Notes (Signed)
Rounding Note    Patient Name: Brenda Matthews Date of Encounter: 06/15/2022  [facility] Cardiologist: Chrystie Nose, MD   Subjective   No acute overnight events. She continues to feel short of breath with only slight improvement from admission. She is currently on 2L of O2 via nasal cannula (not on any O2 at home). No chest pain or palpitations. She remains in atrial fibrillation - rates improved some with higher dose of Amiodarone.  Inpatient Medications    Scheduled Meds:  amiodarone  200 mg Oral BID   apixaban  5 mg Oral BID   atorvastatin  40 mg Oral Daily   furosemide  40 mg Intravenous BID   metoprolol tartrate  100 mg Oral BID   potassium chloride  20 mEq Oral BID   Continuous Infusions:  PRN Meds: acetaminophen **OR** acetaminophen, albuterol, ALPRAZolam, hydrALAZINE, Muscle Rub, ondansetron **OR** ondansetron (ZOFRAN) IV, traZODone   Vital Signs    Vitals:   06/14/22 2009 06/15/22 0033 06/15/22 0513 06/15/22 0513  BP: 119/77  (!) 128/95 (!) 128/95  Pulse: (!) 108  95 (!) 107  Resp: 17  17 17   Temp: 98.1 F (36.7 C)   98.3 F (36.8 C)  TempSrc: Oral   Oral  SpO2: 97% 93% 96% 95%  Weight:      Height:        Intake/Output Summary (Last 24 hours) at 06/15/2022 0730 Last data filed at 06/15/2022 0400 Gross per 24 hour  Intake 560 ml  Output 1375 ml  Net -815 ml      06/11/2022    2:34 PM 06/11/2022    8:18 AM 04/28/2022    8:38 AM  Last 3 Weights  Weight (lbs) 186 lb 15.2 oz 185 lb 6.5 oz 185 lb 6.4 oz  Weight (kg) 84.8 kg 84.1 kg 84.097 kg      Telemetry    Atrial fibrillation with rates in the 80s to low 100s. - Personally Reviewed  ECG    No new ECG tracing today. - Personally Reviewed  Physical Exam   GEN: Obese Caucasian female in no acute distress.   Neck: No significant JVD. Cardiac: Irregularly irregular rhythm with normal rate.  No murmurs, rubs, or gallops.  Respiratory: Mild increased work of breathing. Diminished  breath sounds bilaterally with possible faint rhonchi. GI: Soft, obese, and non-tender. MS: Trace to 1+ pitting edema of bilateral lower extremities. No deformity. Skin: Warm and dry. Neuro:  No focal deficits. Psych: Normal affect.Responds appropriately.   Labs    High Sensitivity Troponin:   Recent Labs  Lab 06/11/22 0916 06/11/22 1115  TROPONINIHS 8 8     Chemistry Recent Labs  Lab 06/11/22 0916 06/12/22 0535 06/13/22 0821 06/14/22 0517 06/15/22 0506  NA 141   < > 139 136 141  K 3.9   < > 3.9 3.7 4.0  CL 110   < > 107 107 108  CO2 21*   < > 23 23 26   GLUCOSE 120*   < > 128* 104* 110*  BUN 27*   < > 33* 30* 36*  CREATININE 0.94   < > 1.07* 1.00 1.07*  CALCIUM 8.8*   < > 8.8* 8.4* 8.9  MG  --   --  2.2 2.1 2.2  PROT 6.5  --   --   --   --   ALBUMIN 3.5  --   --   --   --   AST 33  --   --   --   --  ALT 30  --   --   --   --   ALKPHOS 84  --   --   --   --   BILITOT 0.8  --   --   --   --   GFRNONAA 59*   < > 51* 55* 51*  ANIONGAP 10   < > 9 6 7    < > = values in this interval not displayed.    Lipids No results for input(s): "CHOL", "TRIG", "HDL", "LABVLDL", "LDLCALC", "CHOLHDL" in the last 168 hours.  Hematology Recent Labs  Lab 06/11/22 0916 06/12/22 0535  WBC 8.1 7.9  RBC 4.26 4.24  HGB 11.9* 12.1  HCT 39.1 39.2  MCV 91.8 92.5  MCH 27.9 28.5  MCHC 30.4 30.9  RDW 14.3 14.1  PLT 189 211   Thyroid No results for input(s): "TSH", "FREET4" in the last 168 hours.  BNP Recent Labs  Lab 06/11/22 0916 06/12/22 0535 06/13/22 0821  BNP 1,132.1* 1,501.8* 1,077.2*    DDimer No results for input(s): "DDIMER" in the last 168 hours.   Radiology    No results found.  Cardiac Studies   Left Cardiac Catheterization 09/01/2017: Conclusions: No angiographically significant coronary artery disease. Upper normal left ventricular filling pressure.   Recommendations: Continue medical therapy of diastolic heart failure and atrial fibrillation. Gentle  post-catheterization hydration given resolved acute kidney injury. Restart heparin infusion 2 hours after TR band removal; rivaroxaban could be restarted as soon as tomorrow.   Recommend to resume Rivaroxaban, at currently prescribed dose and frequency, on 09/01/17.  Concurrent antiplatelet therapy not recommended.  _______________   Echocardiogram 06/09/2022: Impressions: 1. Left ventricular ejection fraction, by estimation, is 50 to 55%. The  left ventricle has low normal function. Left ventricular endocardial  border not optimally defined to evaluate regional wall motion. There is  mild concentric left ventricular  hypertrophy. Left ventricular diastolic parameters are indeterminate.   2. Right ventricular systolic function is normal. The right ventricular  size is normal.   3. Left atrial size was moderately dilated.   4. Right atrial size was mildly dilated.   5. The mitral valve is grossly normal. Mild to moderate mitral valve  regurgitation.   6. The aortic valve is tricuspid. There is mild calcification of the  aortic valve. There is mild thickening of the aortic valve. Aortic valve  regurgitation is mild. Aortic valve sclerosis/calcification is present,  without any evidence of aortic  stenosis.   7. Aortic dilatation noted. There is mild dilatation of the ascending  aorta, measuring 41 mm.   Comparison(s): No significant change from prior study.     Patient Profile     87 y.o. female  with a history of normal coronaries on cardiac catheterization in 08/2017, paroxysmal atrial fibrillation, chronic diastolic CHF, hypertension, hyperlipidemia, hyperthyroidism, and CKD stage III  who is being seen 06/14/2022 for the evaluation of acute on chronic diastolic CHF at the request of Dr. Frederick Peers.   Assessment & Plan    Acute on Chronic Diastolic CHF Patient presented with acute shortness of breath. BNP markedly elevated at 1,132. Chest x-ray showed bibasilar collapse/ consolidate  (left > right). Echo on 06/09/2022 just prior to admission showed LVEF of 50-55% with mild LVH, normal RV, and mild dilatation of the ascending aorta measuring 41 mm.  She was started on IV Lasix. Net negative 2.9 L this admission. Renal function stable. Admission weight up 6lbs from office visit in 03/2022. No updated weights since then.  Creatinine stable but BUN starting to increase. - She still has diminished breath sounds bilaterally and is requiring supplemental O2. - Continue IV Lasix 40mg  twice daily for now.  - Consider adding Spironolactone 12.5mg  daily. Will discuss with MD. - Consider starting SLGT2 inhibitor. Will ask Case Manager to run the cost of both [place]. - Please monitor daily weights, strict I/Os, and renal function.   Paroxysmal Atrial Fibrillation Patient has a history of paroxysmal atrial fibrillation and was noted to be back in atrial fibrillation on admission. This may be contributing to CHF exacerbation. - Rates improved some after increasing Amiodarone. Currently in the 80s to low 100s. - Continue Lopressor 100mg  twice daily.  - Continue Amiodarone 200mg  twice daily for 2 weeks and then can decrease back down to 200mg  daily.  - Continue chronic anticoagulation with Eliquis 5mg  twice daily.  - I wonder how much her being back in atrial fibrillation is contributing to her CHF. She may benefit from restoration of sinus rhythm with DCCV. Will discuss with MD.   Hypertension BP reasonably well controlled. Diastolic BP mildly elevated at times. - Continue Lopressor 100mg  twice daily. - Home Amlodipine and Hydralazine on hold well receiving IV diuresis. Would consider discharging on Spironolactone instead.    Hyperlipidemia - Continue Lipitor 40mg  daily.   CKD Stage III Creatinine around 0.9 to 1.2. Creatinine stable at 1.07 today but BUN is starting to rise (30 yesterday and 36 today). - Continue to monitor closely with diuresis.  For questions or  updates, please contact [facility] Please consult [email or web address] for contact info under        Signed, Corrin Parker, PA-C  06/15/2022, 7:30 AM

## 2022-06-15 NOTE — TOC Benefit Eligibility Note (Signed)
Transition of Care [facility]) Benefit Eligibility Note    Patient Details  Name: Brenda Matthews MRN: [id number] Date of Birth: Jan 28, 1935   Medication/Dose: Marcelline Deist and [place] Pepper 10mg  po qd  Covered?: Yes  Tier: Other  Prescription Coverage Preferred Pharmacy: local  Spoke with Person/Company/Phone Number:: Albin Felling [phone]  Co-Pay: Marcelline Deist $64.00 and Jardiance $40.00  Prior Approval: No  Deductible: Met       Caren Macadam Phone Number: 06/15/2022, 2:35 PM

## 2022-06-15 NOTE — Progress Notes (Signed)
Spoke with the patient concerning SOB after using BSC. Pt stated she does not feel sure if she can be discharged today. Will refer this concern to day shift attending.

## 2022-06-15 NOTE — TOC Progression Note (Addendum)
Transition of Care [facility]) - Progression Note    Patient Details  Name: Brenda Matthews MRN: [id number] Date of Birth: 06-22-1935  Transition of Care [facility]) CM/SW Contact  Belal Scallon, Olegario Messier, RN Phone Number: 06/15/2022, 1:18 PM  Clinical Narrative:  Will re submit new auth for [facility] once within 24hrs of d/c. Current.y cardio following may need cardioversion 06/17/22.MD updated. Referral for cost of farxiga/Jardiance will check.    Expected Discharge Plan: Skilled Nursing Facility Barriers to Discharge: Continued Medical Work up  Expected Discharge Plan and Services   Discharge Planning Services: CM Consult Post Acute Care Choice: Skilled Nursing Facility Living arrangements for the past 2 months: Independent Living Facility                                       Social Determinants of Health (SDOH) Interventions SDOH Screenings   Food Insecurity: No Food Insecurity (06/11/2022)  Housing: Patient Declined (06/11/2022)  Transportation Needs: No Transportation Needs (06/11/2022)  Utilities: Not At Risk (06/11/2022)  Alcohol Screen: Low Risk  (10/11/2020)  Depression (PHQ2-9): Low Risk  (10/27/2021)  Financial Resource Strain: Low Risk  (10/27/2021)  Physical Activity: Sufficiently Active (10/27/2021)  Social Connections: Moderately Integrated (10/27/2021)  Stress: No Stress Concern Present (10/27/2021)  Tobacco Use: Low Risk  (06/11/2022)    Readmission Risk Interventions     No data to display

## 2022-06-15 NOTE — Progress Notes (Signed)
Mobility Specialist - Progress Note   06/15/22 1015  Oxygen Therapy  SpO2 (!) 84 %  O2 Device Nasal Cannula  O2 Flow Rate (L/min) 2 L/min  Patient Activity (if Appropriate) Ambulating  Mobility  Activity Ambulated with assistance in hallway;Transferred to/from [facility]  Level of Assistance Standby assist, set-up cues, supervision of patient - no hands on  Assistive Device Front wheel walker  Distance Ambulated (ft) 230 ft  Activity Response Tolerated well  Mobility Referral Yes  $Mobility charge 1 Mobility  Mobility Specialist Start Time (ACUTE ONLY) [id number]  Mobility Specialist Stop Time (ACUTE ONLY) 1013  Mobility Specialist Time Calculation (min) (ACUTE ONLY) 14 min   Pt received in bed and agreeable to mobility. Prior to ambulating, pt requested assistance to [facility]. Upon returning to room, pt desat to 84% on 2L [place]. Encouraged seated pursed lip breaths allowing O2 to come back up to 90%. Prior to that pt c/o feeling SOB. No other complaints during session. Pt to recliner after session with all needs met.   Pre-mobility: 100 HR, 99% SpO2 (2L [place]) During mobility: 90 HR, 92% SpO2 (2L [place]) Post-mobility: 127 HR, 84%-90% SPO2 (2L [place])  Chief Technology Officer

## 2022-06-16 DIAGNOSIS — I5033 Acute on chronic diastolic (congestive) heart failure: Secondary | ICD-10-CM | POA: Diagnosis not present

## 2022-06-16 DIAGNOSIS — I480 Paroxysmal atrial fibrillation: Secondary | ICD-10-CM | POA: Diagnosis not present

## 2022-06-16 DIAGNOSIS — I10 Essential (primary) hypertension: Secondary | ICD-10-CM | POA: Diagnosis not present

## 2022-06-16 LAB — BASIC METABOLIC PANEL
Anion gap: 10 (ref 5–15)
BUN: 41 mg/dL — ABNORMAL HIGH (ref 8–23)
CO2: 25 mmol/L (ref 22–32)
Calcium: 8.9 mg/dL (ref 8.9–10.3)
Chloride: 103 mmol/L (ref 98–111)
Creatinine, Ser: 1.36 mg/dL — ABNORMAL HIGH (ref 0.44–1.00)
GFR, Estimated: 38 mL/min — ABNORMAL LOW (ref >60)
Glucose, Bld: 99 mg/dL (ref 70–99)
Potassium: 3.9 mmol/L (ref 3.5–5.1)
Sodium: 138 mmol/L (ref 135–145)

## 2022-06-16 LAB — MAGNESIUM: Magnesium: 2.2 mg/dL (ref 1.7–2.4)

## 2022-06-16 MED ORDER — EMPAGLIFLOZIN 10 MG PO TABS
10.0000 mg | ORAL_TABLET | Freq: Every day | ORAL | Status: DC
  Filled 2022-06-16: qty 1

## 2022-06-16 NOTE — Progress Notes (Signed)
[facility] PROGRESS NOTE    Progress Note  Brenda Matthews  NWG:[id number] DOB: [birth year] DOA: 06/11/2022 PCP: Jeoffrey Massed, MD     Brief Narrative:   Brenda Matthews is an 87 y.o. female past medical history of chronic diastolic heart failure, chronic kidney disease stage IIIb, paroxysmal atrial fibrillation essential hypertension presented with worsening shortness of breath and lower extremity edema was found hypoxic in the ED being treated for acute diastolic heart failure and paroxysmal atrial fibrillation    Assessment/Plan:   Acute respiratory failure with hypoxia secondary to acute on chronic diastolic CHF (congestive heart failure) (HCC) BNP over thousand hypoxic on admission. On IV diuresis having good output negative about 4 L. Creatinine is slightly elevated today, diuretics per cardiology Continue metoprolol  Paroxysmal atrial fibrillation: Rate controlled on amiodarone metoprolol and Eliquis. She was loaded with amiodarone. Cardiology recommended DCCV on 06/17/2022.  Chronic kidney disease stage IIIb: With a baseline creatinine of 1.1-1.2 has remained at baseline.  Physical deconditioning: PT evaluated the patient, she will need skilled nursing facility.  Hyperlipidemia: Continue Lipitor.  Essential hypertension: Holding amlodipine and hydralazine    DVT prophylaxis: Eliquis Family Communication:none Status is: Inpatient Remains inpatient appropriate because: Acute respiratory failure with hypoxia due to acute diastolic heart failure    Code Status:     Code Status Orders  (From admission, onward)           Start     Ordered   06/11/22 1224  Full code  Continuous       Question:  By:  Answer:  Consent: discussion documented in EHR   06/11/22 1225           Code Status History     Date Active Date Inactive Code Status Order ID Comments User Context   09/14/2020 0945 09/20/2020 0204 Full Code [id number]  Marlow Baars, MD Inpatient    09/26/2018 1310 09/27/2018 1831 Full Code [id number]  Ranee Gosselin, MD Inpatient   08/29/2017 1438 09/11/2017 2152 Full Code [id number]  Gwenyth Bender, NP ED   02/18/2016 1952 02/20/2016 2005 Full Code [id number]  Toni Arthurs, MD Inpatient      Advance Directive Documentation    Flowsheet Row Most Recent Value  Type of Advance Directive Healthcare Power of Attorney  Pre-existing out of facility DNR order (yellow form or pink MOST form) --  "MOST" Form in Place? --         IV Access:   Peripheral IV   Procedures and diagnostic studies:   No results found.   Medical Consultants:   None.   Subjective:    [name] relates her breathing is better  Objective:    Vitals:   06/15/22 1253 06/15/22 1440 06/15/22 2034 06/16/22 0430  BP: 122/77  131/76 (!) 111/55  Pulse: 99  98 95  Resp: (!) 22  18 17   Temp: 97.8 F (36.6 C)  98.1 F (36.7 C) 98.1 F (36.7 C)  TempSrc: Oral  Oral Oral  SpO2: 99% 90% 93% 99%  Weight:      Height:       SpO2: 99 % O2 Flow Rate (L/min): 2 L/min   Intake/Output Summary (Last 24 hours) at 06/16/2022 0836 Last data filed at 06/16/2022 0520 Gross per 24 hour  Intake 0 ml  Output 1600 ml  Net -1600 ml    Filed Weights   06/11/22 0818 06/11/22 1434  Weight: 84.1 kg 84.8 kg    Exam: General exam:  In no acute distress. Respiratory system: Good air movement and clear to auscultation. Cardiovascular system: S1 & S2 heard, RRR. No JVD. Gastrointestinal system: Abdomen is nondistended, soft and nontender.  Extremities: 3 + edema Skin: No rashes, lesions or ulcers Psychiatry: Judgement and insight appear normal. Mood & affect appropriate. Data Reviewed:    Labs: Basic Metabolic Panel: Recent Labs  Lab 06/12/22 0535 06/13/22 0821 06/14/22 0517 06/15/22 0506 06/16/22 0510  NA 138 139 136 141 138  K 4.3 3.9 3.7 4.0 3.9  CL 108 107 107 108 103  CO2 21* 23 23 26 25   GLUCOSE 171* 128* 104* 110* 99  BUN 33* 33* 30* 36* 41*   CREATININE 1.29* 1.07* 1.00 1.07* 1.36*  CALCIUM 8.9 8.8* 8.4* 8.9 8.9  MG  --  2.2 2.1 2.2 2.2    GFR Estimated Creatinine Clearance: 29.9 mL/min (A) (by C-G formula based on SCr of 1.36 mg/dL (H)). Liver Function Tests: Recent Labs  Lab 06/11/22 0916  AST 33  ALT 30  ALKPHOS 84  BILITOT 0.8  PROT 6.5  ALBUMIN 3.5    No results for input(s): "LIPASE", "AMYLASE" in the last 168 hours. No results for input(s): "AMMONIA" in the last 168 hours. Coagulation profile No results for input(s): "INR", "PROTIME" in the last 168 hours. COVID-19 Labs  No results for input(s): "DDIMER", "FERRITIN", "LDH", "CRP" in the last 72 hours.  Lab Results  Component Value Date   SARSCOV2NAA NEGATIVE 09/19/2020   SARSCOV2NAA NEGATIVE 09/14/2020   SARSCOV2NAA NOT DETECTED 09/22/2018    CBC: Recent Labs  Lab 06/11/22 0916 06/12/22 0535  WBC 8.1 7.9  NEUTROABS 6.1  --   HGB 11.9* 12.1  HCT 39.1 39.2  MCV 91.8 92.5  PLT 189 211    Cardiac Enzymes: No results for input(s): "CKTOTAL", "CKMB", "CKMBINDEX", "TROPONINI" in the last 168 hours. BNP (last 3 results) No results for input(s): "PROBNP" in the last 8760 hours. CBG: No results for input(s): "GLUCAP" in the last 168 hours. D-Dimer: No results for input(s): "DDIMER" in the last 72 hours. Hgb A1c: No results for input(s): "HGBA1C" in the last 72 hours. Lipid Profile: No results for input(s): "CHOL", "HDL", "LDLCALC", "TRIG", "CHOLHDL", "LDLDIRECT" in the last 72 hours. Thyroid function studies: No results for input(s): "TSH", "T4TOTAL", "T3FREE", "THYROIDAB" in the last 72 hours.  Invalid input(s): "FREET3" Anemia work up: No results for input(s): "VITAMINB12", "FOLATE", "FERRITIN", "TIBC", "IRON", "RETICCTPCT" in the last 72 hours. Sepsis Labs: Recent Labs  Lab 06/11/22 0916 06/12/22 0535  WBC 8.1 7.9    Microbiology No results found for this or any previous visit (from the past 240 hour(s)).   Medications:     amiodarone  200 mg Oral BID   apixaban  5 mg Oral BID   atorvastatin  40 mg Oral Daily   furosemide  40 mg Intravenous BID   metoprolol tartrate  100 mg Oral BID   potassium chloride  20 mEq Oral BID   Continuous Infusions:    LOS: 4 days   Marinda Elk  [facility]  06/16/2022, 8:36 AM

## 2022-06-16 NOTE — Progress Notes (Addendum)
Rounding Note    Patient Name: Brenda Matthews Date of Encounter: 06/16/2022  [facility] Cardiologist: Chrystie Nose, MD   Subjective   Breathing improved, ambulated with PT this morning. Legs still swollen, not back to baseline.   Inpatient Medications    Scheduled Meds:  amiodarone  200 mg Oral BID   apixaban  5 mg Oral BID   atorvastatin  40 mg Oral Daily   furosemide  40 mg Intravenous BID   metoprolol tartrate  100 mg Oral BID   potassium chloride  20 mEq Oral BID   Continuous Infusions:  PRN Meds: acetaminophen **OR** acetaminophen, albuterol, ALPRAZolam, hydrALAZINE, Muscle Rub, ondansetron **OR** ondansetron (ZOFRAN) IV, traZODone   Vital Signs    Vitals:   06/15/22 1440 06/15/22 2034 06/16/22 0430 06/16/22 1025  BP:  131/76 (!) 111/55 106/74  Pulse:  98 95 72  Resp:  18 17   Temp:  98.1 F (36.7 C) 98.1 F (36.7 C)   TempSrc:  Oral Oral   SpO2: 90% 93% 99%   Weight:      Height:        Intake/Output Summary (Last 24 hours) at 06/16/2022 1044 Last data filed at 06/16/2022 0952 Gross per 24 hour  Intake 120 ml  Output 1700 ml  Net -1580 ml      06/11/2022    2:34 PM 06/11/2022    8:18 AM 04/28/2022    8:38 AM  Last 3 Weights  Weight (lbs) 186 lb 15.2 oz 185 lb 6.5 oz 185 lb 6.4 oz  Weight (kg) 84.8 kg 84.1 kg 84.097 kg      Telemetry    Atrial fibrillation with HR 80s - Personally Reviewed  ECG    Atrial fibrillation with HR 105 - Personally Reviewed  Physical Exam   GEN: No acute distress.   Neck: No JVD Cardiac: irregular, no murmurs, rubs, or gallops.  Respiratory: markedly diminished breath sound in bilateral bases. Marland Kitchen GI: Soft, nontender, non-distended  MS: 2+ edema; No deformity. Neuro:  Nonfocal  Psych: Normal affect   Labs    High Sensitivity Troponin:   Recent Labs  Lab 06/11/22 0916 06/11/22 1115  TROPONINIHS 8 8     Chemistry Recent Labs  Lab 06/11/22 0916 06/12/22 0535 06/14/22 0517 06/15/22 0506  06/16/22 0510  NA 141   < > 136 141 138  K 3.9   < > 3.7 4.0 3.9  CL 110   < > 107 108 103  CO2 21*   < > 23 26 25   GLUCOSE 120*   < > 104* 110* 99  BUN 27*   < > 30* 36* 41*  CREATININE 0.94   < > 1.00 1.07* 1.36*  CALCIUM 8.8*   < > 8.4* 8.9 8.9  MG  --    < > 2.1 2.2 2.2  PROT 6.5  --   --   --   --   ALBUMIN 3.5  --   --   --   --   AST 33  --   --   --   --   ALT 30  --   --   --   --   ALKPHOS 84  --   --   --   --   BILITOT 0.8  --   --   --   --   GFRNONAA 59*   < > 55* 51* 38*  ANIONGAP 10   < > 6  7 10   < > = values in this interval not displayed.    Lipids No results for input(s): "CHOL", "TRIG", "HDL", "LABVLDL", "LDLCALC", "CHOLHDL" in the last 168 hours.  Hematology Recent Labs  Lab 06/11/22 0916 06/12/22 0535  WBC 8.1 7.9  RBC 4.26 4.24  HGB 11.9* 12.1  HCT 39.1 39.2  MCV 91.8 92.5  MCH 27.9 28.5  MCHC 30.4 30.9  RDW 14.3 14.1  PLT 189 211   Thyroid No results for input(s): "TSH", "FREET4" in the last 168 hours.  BNP Recent Labs  Lab 06/11/22 0916 06/12/22 0535 06/13/22 0821  BNP 1,132.1* 1,501.8* 1,077.2*    DDimer No results for input(s): "DDIMER" in the last 168 hours.   Radiology    No results found.  Cardiac Studies    Echocardiogram 06/09/2022: Impressions: 1. Left ventricular ejection fraction, by estimation, is 50 to 55%. The  left ventricle has low normal function. Left ventricular endocardial  border not optimally defined to evaluate regional wall motion. There is  mild concentric left ventricular  hypertrophy. Left ventricular diastolic parameters are indeterminate.   2. Right ventricular systolic function is normal. The right ventricular  size is normal.   3. Left atrial size was moderately dilated.   4. Right atrial size was mildly dilated.   5. The mitral valve is grossly normal. Mild to moderate mitral valve  regurgitation.   6. The aortic valve is tricuspid. There is mild calcification of the  aortic valve. There is  mild thickening of the aortic valve. Aortic valve  regurgitation is mild. Aortic valve sclerosis/calcification is present,  without any evidence of aortic  stenosis.   7. Aortic dilatation noted. There is mild dilatation of the ascending  aorta, measuring 41 mm.   Comparison(s): No significant change from prior study.     Patient Profile     87 y.o. female with PMH of normal cors on cath 08/2017, PAF, chronic diastolic CHF, HTN, HLD, hyperthyroidism, and CKD stage III who presented with acute on chronic diastolic heart failure and recurrent afib.   Assessment & Plan    Acute on chronic diastolic heart failure - Echo 1/61/0960 showed EF 50-55%, mild LVH, normal RV, mildly dilated ascending aorta 41 mm  - bibasilar collapse/consolidation (L>R) noted on initial CXR  - on 40mg  BID IV lasix, LE edema persist. Benefit check Jardiance copay $40, compare to $64 for farxiga. Will add jardiance  - Cr mild bump from 1.07 to 1.36 today, since she still appears to be volume overloaded, will continue IV diuretic for one more day.   PAF  - loaded on amiodarone, continue on metoprolol 100mg  BID  - plan to proceed with DCCV tomorrow  Hypertension: on metoprolol tatrate 100mg  BID  Hyperlipidemia: on lipitor  CKD stage III: Cr went up slightly to 1.36 from 1.07 yesterday      For questions or updates, please contact  [facility] Please consult [email or web address] for contact info under        Signed, Azalee Course, PA  06/16/2022, 10:44 AM

## 2022-06-16 NOTE — Progress Notes (Signed)
Mobility Specialist - Progress Note   06/16/22 1522  Mobility  Activity Ambulated independently in room  Level of Assistance Standby assist, set-up cues, supervision of patient - no hands on  Assistive Device None  Distance Ambulated (ft) 250 ft  Activity Response Tolerated well  Mobility Referral Yes  $Mobility charge 1 Mobility  Mobility Specialist Start Time (ACUTE ONLY) 0306  Mobility Specialist Stop Time (ACUTE ONLY) 0318  Mobility Specialist Time Calculation (min) (ACUTE ONLY) 12 min   Pt received in bed and agreeable to mobility. At EOS pt c/o feeling SOB. O2 checked & at 91%. No other complaints during session. Pt to bed after session with all needs met.    [facility]

## 2022-06-16 NOTE — Plan of Care (Signed)

## 2022-06-16 NOTE — Progress Notes (Signed)
Physical Therapy Treatment Patient Details Name: Brenda Matthews MRN: [id number] DOB: 1935/03/09 Today's Date: 06/16/2022   History of Present Illness Ms. Declark is an 87 yo female with PMH chronic dCHF, CKD3b, HTN, HLD who presented 06/11/22  with worsening shortness of breath and lower extremity swelling.    PT Comments    Pt is progressing with mobility, she tolerated an increased ambulation distance of 180' with RW and 2L O2, no loss of balance. SpO2 90% on 2L O2 walking, distance limited by fatigue. Pt performed seated BUE/LE exercises.   Recommendations for follow up therapy are one component of a multi-disciplinary discharge planning process, led by the attending physician.  Recommendations may be updated based on patient status, additional functional criteria and insurance authorization.  Follow Up Recommendations  Can patient physically be transported by private vehicle: Yes    Assistance Recommended at Discharge Intermittent Supervision/Assistance  Patient can return home with the following A little help with bathing/dressing/bathroom;Assist for transportation;Help with stairs or ramp for entrance   Equipment Recommendations  None recommended by PT    Recommendations for Other Services       Precautions / Restrictions Precautions Precautions: Fall Precaution Comments: monitor sats, on 2-3 LPM Restrictions Weight Bearing Restrictions: No     Mobility  Bed Mobility               General bed mobility comments: sitting at EOB    Transfers Overall transfer level: Needs assistance Equipment used: Rolling walker (2 wheels) Transfers: Sit to/from Stand Sit to Stand: Supervision                Ambulation/Gait Ambulation/Gait assistance: Supervision Gait Distance (Feet): 180 Feet Assistive device: Rolling walker (2 wheels) Gait Pattern/deviations: Step-through pattern, Trunk flexed, Decreased stride length Gait velocity: WFL     General Gait Details: VCs  to lift head, no loss of balance, ambulated with 2L O2   Stairs             Wheelchair Mobility    Modified Rankin (Stroke Patients Only)       Balance Overall balance assessment: History of Falls, Needs assistance Sitting-balance support: Feet supported, No upper extremity supported Sitting balance-Leahy Scale: Good     Standing balance support: During functional activity, Reliant on assistive device for balance, Bilateral upper extremity supported Standing balance-Leahy Scale: Fair Standing balance comment: reports h/o fall in March slipped on wet grass                            Cognition Arousal/Alertness: Awake/alert Behavior During Therapy: WFL for tasks assessed/performed Overall Cognitive Status: Within Functional Limits for tasks assessed                                          Exercises General Exercises - Lower Extremity Ankle Circles/Pumps: AROM, Both, 15 reps, Seated Long Arc Quad: AROM, Both, 10 reps, Seated Hip Flexion/Marching: AROM, Both, 10 reps, Seated Shoulder Exercises Shoulder Flexion: AROM, Both, 10 reps, Seated    General Comments        Pertinent Vitals/Pain Pain Assessment Pain Assessment: No/denies pain    Home Living                          Prior Function  PT Goals (current goals can now be found in the care plan section) Acute Rehab PT Goals Patient Stated Goal: improve strength and balance PT Goal Formulation: With patient Time For Goal Achievement: 06/27/22 Potential to Achieve Goals: Good Progress towards PT goals: Progressing toward goals    Frequency    Min 1X/week      PT Plan Current plan remains appropriate    Co-evaluation              AM-PAC PT "6 Clicks" Mobility   Outcome Measure  Help needed turning from your back to your side while in a flat bed without using bedrails?: None Help needed moving from lying on your back to sitting on the  side of a flat bed without using bedrails?: None Help needed moving to and from a bed to a chair (including a wheelchair)?: None Help needed standing up from a chair using your arms (e.g., wheelchair or bedside chair)?: A Little Help needed to walk in hospital room?: A Little Help needed climbing 3-5 steps with a railing? : A Little 6 Click Score: 21    End of Session Equipment Utilized During Treatment: Gait belt;Oxygen Activity Tolerance: Patient tolerated treatment well Patient left: in chair;with call bell/phone within reach;with family/visitor present Nurse Communication: Mobility status PT Visit Diagnosis: Unsteadiness on feet (R26.81);History of falling (Z91.81);Difficulty in walking, not elsewhere classified (R26.2)     Time: 1203-1218 PT Time Calculation (min) (ACUTE ONLY): 15 min  Charges:  $Gait Training: 8-22 mins                     Ralene Bathe Kistler PT 06/16/2022  Acute Rehabilitation Services  Office [phone]

## 2022-06-16 NOTE — Progress Notes (Signed)
Mobility Specialist - Progress Note   06/16/22 0950  Mobility  Activity Ambulated with assistance in hallway  Level of Assistance Standby assist, set-up cues, supervision of patient - no hands on  Assistive Device Front wheel walker  Distance Ambulated (ft) 230 ft  Activity Response Tolerated well  Mobility Referral Yes  $Mobility charge 1 Mobility  Mobility Specialist Start Time (ACUTE ONLY) [id number]  Mobility Specialist Stop Time (ACUTE ONLY) 0949  Mobility Specialist Time Calculation (min) (ACUTE ONLY) 12 min   Pt received EOB and agreeable to mobility. Pt c/o feeling weaker today & more SOB compared to yesterday. Pt requested breathing treatment & nurse notified of request. No other complaints during session. Pt to recliner after session with all needs met.    Pre-mobility: 92% SpO2 (2L [place]) During mobility: 90% SpO2 (2L [place]) Post-mobility: 90% SPO2 (2L [place])  Chief Technology Officer

## 2022-06-17 ENCOUNTER — Encounter (HOSPITAL_COMMUNITY): Payer: Self-pay | Admitting: Internal Medicine

## 2022-06-17 ENCOUNTER — Inpatient Hospital Stay (HOSPITAL_COMMUNITY): Payer: Medicare PPO | Admitting: Certified Registered Nurse Anesthetist

## 2022-06-17 ENCOUNTER — Encounter (HOSPITAL_COMMUNITY)
Admission: EM | Disposition: A | Discharge status: Home / Self Care | Payer: Self-pay | Source: Home / Self Care | Attending: Internal Medicine

## 2022-06-17 DIAGNOSIS — I4891 Unspecified atrial fibrillation: Secondary | ICD-10-CM

## 2022-06-17 DIAGNOSIS — I509 Heart failure, unspecified: Secondary | ICD-10-CM

## 2022-06-17 DIAGNOSIS — F419 Anxiety disorder, unspecified: Secondary | ICD-10-CM

## 2022-06-17 DIAGNOSIS — I110 Hypertensive heart disease with heart failure: Secondary | ICD-10-CM | POA: Diagnosis not present

## 2022-06-17 DIAGNOSIS — I5033 Acute on chronic diastolic (congestive) heart failure: Secondary | ICD-10-CM | POA: Diagnosis not present

## 2022-06-17 DIAGNOSIS — I480 Paroxysmal atrial fibrillation: Secondary | ICD-10-CM | POA: Diagnosis not present

## 2022-06-17 HISTORY — PX: CARDIOVERSION: SHX1299

## 2022-06-17 LAB — BASIC METABOLIC PANEL
Anion gap: 10 (ref 5–15)
BUN: 35 mg/dL — ABNORMAL HIGH (ref 8–23)
CO2: 26 mmol/L (ref 22–32)
Calcium: 9.2 mg/dL (ref 8.9–10.3)
Chloride: 104 mmol/L (ref 98–111)
Creatinine, Ser: 1.11 mg/dL — ABNORMAL HIGH (ref 0.44–1.00)
GFR, Estimated: 49 mL/min — ABNORMAL LOW (ref >60)
Glucose, Bld: 107 mg/dL — ABNORMAL HIGH (ref 70–99)
Potassium: 4 mmol/L (ref 3.5–5.1)
Sodium: 140 mmol/L (ref 135–145)

## 2022-06-17 LAB — MAGNESIUM: Magnesium: 2.2 mg/dL (ref 1.7–2.4)

## 2022-06-17 SURGERY — CARDIOVERSION
Anesthesia: General

## 2022-06-17 MED ORDER — SODIUM CHLORIDE 0.9 % IV SOLN: INTRAVENOUS | Status: DC

## 2022-06-17 MED ORDER — LIDOCAINE 2% (20 MG/ML) 5 ML SYRINGE
INTRAMUSCULAR | Status: DC | PRN
  Administered 2022-06-17: 40 mg via INTRAVENOUS

## 2022-06-17 MED ORDER — PROPOFOL 10 MG/ML IV BOLUS
INTRAVENOUS | Status: DC | PRN
  Administered 2022-06-17: 30 mg via INTRAVENOUS

## 2022-06-17 SURGICAL SUPPLY — 1 items: ELECT DEFIB PAD ADLT CADENCE (PAD) ×1 IMPLANT

## 2022-06-17 NOTE — Progress Notes (Signed)
OT Cancellation Note  Patient Details Name: [name] MRN: [id number] DOB: [birth year]   Cancelled Treatment:    Reason Eval/Treat Not Completed: Patient at procedure or test/ unavailable  Evern Bio 06/17/2022, 10:21 AM Berna Spare, OTR/L Acute Rehabilitation Services Office: [phone]

## 2022-06-17 NOTE — Anesthesia Preprocedure Evaluation (Addendum)
Anesthesia Evaluation  Patient identified by MRN, date of birth, ID band Patient awake    Reviewed: Allergy & Precautions, NPO status , Patient's Chart, lab work & pertinent test results, reviewed documented beta blocker date and time   Airway Mallampati: II  TM Distance: >3 FB Neck ROM: Full    Dental  (+) Edentulous Upper, Edentulous Lower, Lower Dentures, Upper Dentures   Pulmonary neg pulmonary ROS   Pulmonary exam normal breath sounds clear to auscultation       Cardiovascular hypertension, Pt. on home beta blockers and Pt. on medications +CHF and + DOE  Normal cardiovascular exam+ dysrhythmias (eliquis) Atrial Fibrillation  Rhythm:Irregular Rate:Normal  TTE 2024 1. Left ventricular ejection fraction, by estimation, is 50 to 55%. The  left ventricle has low normal function. Left ventricular endocardial  border not optimally defined to evaluate regional wall motion. There is  mild concentric left ventricular  hypertrophy. Left ventricular diastolic parameters are indeterminate.   2. Right ventricular systolic function is normal. The right ventricular  size is normal.   3. Left atrial size was moderately dilated.   4. Right atrial size was mildly dilated.   5. The mitral valve is grossly normal. Mild to moderate mitral valve  regurgitation.   6. The aortic valve is tricuspid. There is mild calcification of the  aortic valve. There is mild thickening of the aortic valve. Aortic valve  regurgitation is mild. Aortic valve sclerosis/calcification is present,  without any evidence of aortic  stenosis.   7. Aortic dilatation noted. There is mild dilatation of the ascending  aorta, measuring 41 mm.     Neuro/Psych  PSYCHIATRIC DISORDERS Anxiety     negative neurological ROS     GI/Hepatic negative GI ROS, Neg liver ROS,,,  Endo/Other  negative endocrine ROS    Renal/GU Renal InsufficiencyRenal disease  negative  genitourinary   Musculoskeletal  (+) Arthritis ,    Abdominal   Peds  Hematology negative hematology ROS (+)   Anesthesia Other Findings 87 y.o. female past medical history of chronic diastolic heart failure, chronic kidney disease stage IIIb, paroxysmal atrial fibrillation essential hypertension presented with worsening shortness of breath and lower extremity edema was found hypoxic in the ED being treated for acute diastolic heart failure and paroxysmal atrial fibrillation  Reproductive/Obstetrics                             Anesthesia Physical Anesthesia Plan  ASA: 3  Anesthesia Plan: General   Post-op Pain Management:    Induction: Intravenous  PONV Risk Score and Plan: Propofol infusion and Treatment may vary due to age or medical condition  Airway Management Planned: Natural Airway  Additional Equipment:   Intra-op Plan:   Post-operative Plan:   Informed Consent: I have reviewed the patients History and Physical, chart, labs and discussed the procedure including the risks, benefits and alternatives for the proposed anesthesia with the patient or authorized representative who has indicated his/her understanding and acceptance.     Dental advisory given  Plan Discussed with: CRNA  Anesthesia Plan Comments:        Anesthesia Quick Evaluation

## 2022-06-17 NOTE — CV Procedure (Signed)
Procedure:   DCCV  Indication:  Symptomatic atrial fibrillation  Procedure Note:  The patient signed informed consent.  They have had had therapeutic anticoagulation with apixaban greater than 3 weeks.  Anesthesia was administered by Dr. Armond Hang.  Patient received 40 mg IV lidocaine and 30 mg IV propofol.Adequate airway was maintained throughout and vital followed per protocol.  They were cardioverted x 1 with 120J of biphasic synchronized energy.  They converted to NSR.  There were no apparent complications.  The patient had normal neuro status and respiratory status post procedure with vitals stable as recorded elsewhere.    Follow up:  They will continue on current medical therapy and follow up with cardiology as scheduled.  Jodelle Red, MD PhD 06/17/2022 11:41 AM

## 2022-06-17 NOTE — Interval H&P Note (Signed)
History and Physical Interval Note:  06/17/2022 11:28 AM  Brenda Matthews  has presented today for surgery, with the diagnosis of AFIB.  The various methods of treatment have been discussed with the patient and family. After consideration of risks, benefits and other options for treatment, the patient has consented to  Procedure(s): CARDIOVERSION (N/A) as a surgical intervention.  The patient's history has been reviewed, patient examined, no change in status, stable for surgery.  I have reviewed the patient's chart and labs.  Questions were answered to the patient's satisfaction.     Wilma Wuthrich Cristal Deer

## 2022-06-17 NOTE — Progress Notes (Signed)
 Rounding Note    Patient Name: Brenda Matthews Date of Encounter: 06/17/2022  [facility] Cardiologist: Kenneth C Hilty, MD   Subjective   Still on 2 L [place]. No chest pain. Still has 1-2+ pitting edema  Inpatient Medications    Scheduled Meds:  amiodarone  200 mg Oral BID   apixaban  5 mg Oral BID   atorvastatin  40 mg Oral Daily   furosemide  40 mg Intravenous BID   metoprolol tartrate  100 mg Oral BID   potassium chloride  20 mEq Oral BID   Continuous Infusions:  sodium chloride     PRN Meds: acetaminophen **OR** acetaminophen, albuterol, ALPRAZolam, hydrALAZINE, Muscle Rub, ondansetron **OR** ondansetron (ZOFRAN) IV, traZODone   Vital Signs    Vitals:   06/16/22 1841 06/16/22 2046 06/17/22 0406 06/17/22 0726  BP: 130/77 126/79 114/78   Pulse: 99 83 74   Resp: (!) 24 18 18   Temp: 97.6 F (36.4 C) (!) 97.5 F (36.4 C) 97.7 F (36.5 C)   TempSrc: Oral Oral Oral   SpO2: 97% 97% 97%   Weight:    84.6 kg  Height:        Intake/Output Summary (Last 24 hours) at 06/17/2022 0920 Last data filed at 06/17/2022 0500 Gross per 24 hour  Intake 240 ml  Output 1850 ml  Net -1610 ml      06/17/2022    7:26 AM 06/11/2022    2:34 PM 06/11/2022    8:18 AM  Last 3 Weights  Weight (lbs) 186 lb 8.2 oz 186 lb 15.2 oz 185 lb 6.5 oz  Weight (kg) 84.6 kg 84.8 kg 84.1 kg      Telemetry    Atrial fibrillation with HR 90 to low 100s - Personally Reviewed  ECG    Atrial fibrillation - Personally Reviewed  Physical Exam   GEN: No acute distress.   Neck: No JVD Cardiac: irregularly irregular, no murmurs, rubs, or gallops.  Respiratory: Clear to auscultation bilaterally. GI: Soft, nontender, non-distended  MS: 1-2+ edema; No deformity. Neuro:  Nonfocal  Psych: Normal affect   Labs    High Sensitivity Troponin:   Recent Labs  Lab 06/11/22 0916 06/11/22 1115  TROPONINIHS 8 8     Chemistry Recent Labs  Lab 06/11/22 0916 06/12/22 0535 06/15/22 0506  06/16/22 0510 06/17/22 0530  NA 141   < > 141 138 140  K 3.9   < > 4.0 3.9 4.0  CL 110   < > 108 103 104  CO2 21*   < > 26 25 26  GLUCOSE 120*   < > 110* 99 107*  BUN 27*   < > 36* 41* 35*  CREATININE 0.94   < > 1.07* 1.36* 1.11*  CALCIUM 8.8*   < > 8.9 8.9 9.2  MG  --    < > 2.2 2.2 2.2  PROT 6.5  --   --   --   --   ALBUMIN 3.5  --   --   --   --   AST 33  --   --   --   --   ALT 30  --   --   --   --   ALKPHOS 84  --   --   --   --   BILITOT 0.8  --   --   --   --   GFRNONAA 59*   < > 51* 38* 49*  ANIONGAP   10   < > 7 10 10   < > = values in this interval not displayed.    Lipids No results for input(s): "CHOL", "TRIG", "HDL", "LABVLDL", "LDLCALC", "CHOLHDL" in the last 168 hours.  Hematology Recent Labs  Lab 06/11/22 0916 06/12/22 0535  WBC 8.1 7.9  RBC 4.26 4.24  HGB 11.9* 12.1  HCT 39.1 39.2  MCV 91.8 92.5  MCH 27.9 28.5  MCHC 30.4 30.9  RDW 14.3 14.1  PLT 189 211   Thyroid No results for input(s): "TSH", "FREET4" in the last 168 hours.  BNP Recent Labs  Lab 06/11/22 0916 06/12/22 0535 06/13/22 0821  BNP 1,132.1* 1,501.8* 1,077.2*    DDimer No results for input(s): "DDIMER" in the last 168 hours.   Radiology    No results found.  Cardiac Studies   Echo 06/09/2022  1. Left ventricular ejection fraction, by estimation, is 50 to 55%. The  left ventricle has low normal function. Left ventricular endocardial  border not optimally defined to evaluate regional wall motion. There is  mild concentric left ventricular  hypertrophy. Left ventricular diastolic parameters are indeterminate.   2. Right ventricular systolic function is normal. The right ventricular  size is normal.   3. Left atrial size was moderately dilated.   4. Right atrial size was mildly dilated.   5. The mitral valve is grossly normal. Mild to moderate mitral valve  regurgitation.   6. The aortic valve is tricuspid. There is mild calcification of the  aortic valve. There is mild  thickening of the aortic valve. Aortic valve  regurgitation is mild. Aortic valve sclerosis/calcification is present,  without any evidence of aortic  stenosis.   7. Aortic dilatation noted. There is mild dilatation of the ascending  aorta, measuring 41 mm.   Comparison(s): No significant change from prior study.   Patient Profile     87 y.o. female with PMH of normal cors on cath 08/2017, PAF, chronic diastolic CHF, HTN, HLD, hyperthyroidism, and CKD stage III who presented with acute on chronic diastolic heart failure and recurrent afib.   Assessment & Plan    Acute on chronic diastolic heart failure - Echo 06/09/2022 showed EF 50-55%, mild LVH, normal RV, mildly dilated ascending aorta 41 mm             - bibasilar collapse/consolidation (L>R) noted on initial CXR             - on 40mg BID IV lasix, LE edema persist. Benefit check Jardiance copay $40, compare to $64 for farxiga. Will add jardiance after cardioversion             - renal function stable with IV diuresis. Hopefully will diureses better after cardioversion.    PAF             - loaded on amiodarone, continue on metoprolol 100mg BID             - plan to proceed with DCCV today   Hypertension: on metoprolol tatrate 100mg BID   Hyperlipidemia: on lipitor   CKD stage III: Cr stable      For questions or updates, please contact [facility] Please consult [email or web address] for contact info under        Signed, Vaughan Garfinkle, PA  06/17/2022, 9:20 AM    

## 2022-06-17 NOTE — TOC Progression Note (Signed)
Transition of Care [facility]) - Progression Note    Patient Details  Name: Brenda Matthews MRN: [id number] Date of Birth: [birth year]  Transition of Care [facility]) CM/SW Contact  Roanna Reaves, Olegario Messier, RN Phone Number: 06/17/2022, 11:35 AM  Clinical Narrative:await medical stability  post cardioversion to initiate auth for [facility] when appropriate.       Expected Discharge Plan: Skilled Nursing Facility Barriers to Discharge: Continued Medical Work up  Expected Discharge Plan and Services   Discharge Planning Services: CM Consult Post Acute Care Choice: Skilled Nursing Facility Living arrangements for the past 2 months: Independent Living Facility                                       Social Determinants of Health (SDOH) Interventions SDOH Screenings   Food Insecurity: No Food Insecurity (06/11/2022)  Housing: Patient Declined (06/11/2022)  Transportation Needs: No Transportation Needs (06/11/2022)  Utilities: Not At Risk (06/11/2022)  Alcohol Screen: Low Risk  (10/11/2020)  Depression (PHQ2-9): Low Risk  (10/27/2021)  Financial Resource Strain: Low Risk  (10/27/2021)  Physical Activity: Sufficiently Active (10/27/2021)  Social Connections: Moderately Integrated (10/27/2021)  Stress: No Stress Concern Present (10/27/2021)  Tobacco Use: Low Risk  (06/17/2022)    Readmission Risk Interventions     No data to display

## 2022-06-17 NOTE — H&P (View-Only) (Signed)
Rounding Note    Patient Name: Brenda Matthews Date of Encounter: 06/17/2022  [facility] Cardiologist: Chrystie Nose, MD   Subjective   Still on 2 L [place]. No chest pain. Still has 1-2+ pitting edema  Inpatient Medications    Scheduled Meds:  amiodarone  200 mg Oral BID   apixaban  5 mg Oral BID   atorvastatin  40 mg Oral Daily   furosemide  40 mg Intravenous BID   metoprolol tartrate  100 mg Oral BID   potassium chloride  20 mEq Oral BID   Continuous Infusions:  sodium chloride     PRN Meds: acetaminophen **OR** acetaminophen, albuterol, ALPRAZolam, hydrALAZINE, Muscle Rub, ondansetron **OR** ondansetron (ZOFRAN) IV, traZODone   Vital Signs    Vitals:   06/16/22 1841 06/16/22 2046 06/17/22 0406 06/17/22 0726  BP: 130/77 126/79 114/78   Pulse: 99 83 74   Resp: (!) 24 18 18    Temp: 97.6 F (36.4 C) (!) 97.5 F (36.4 C) 97.7 F (36.5 C)   TempSrc: Oral Oral Oral   SpO2: 97% 97% 97%   Weight:    84.6 kg  Height:        Intake/Output Summary (Last 24 hours) at 06/17/2022 0920 Last data filed at 06/17/2022 0500 Gross per 24 hour  Intake 240 ml  Output 1850 ml  Net -1610 ml      06/17/2022    7:26 AM 06/11/2022    2:34 PM 06/11/2022    8:18 AM  Last 3 Weights  Weight (lbs) 186 lb 8.2 oz 186 lb 15.2 oz 185 lb 6.5 oz  Weight (kg) 84.6 kg 84.8 kg 84.1 kg      Telemetry    Atrial fibrillation with HR 90 to low 100s - Personally Reviewed  ECG    Atrial fibrillation - Personally Reviewed  Physical Exam   GEN: No acute distress.   Neck: No JVD Cardiac: irregularly irregular, no murmurs, rubs, or gallops.  Respiratory: Clear to auscultation bilaterally. GI: Soft, nontender, non-distended  MS: 1-2+ edema; No deformity. Neuro:  Nonfocal  Psych: Normal affect   Labs    High Sensitivity Troponin:   Recent Labs  Lab 06/11/22 0916 06/11/22 1115  TROPONINIHS 8 8     Chemistry Recent Labs  Lab 06/11/22 0916 06/12/22 0535 06/15/22 0506  06/16/22 0510 06/17/22 0530  NA 141   < > 141 138 140  K 3.9   < > 4.0 3.9 4.0  CL 110   < > 108 103 104  CO2 21*   < > 26 25 26   GLUCOSE 120*   < > 110* 99 107*  BUN 27*   < > 36* 41* 35*  CREATININE 0.94   < > 1.07* 1.36* 1.11*  CALCIUM 8.8*   < > 8.9 8.9 9.2  MG  --    < > 2.2 2.2 2.2  PROT 6.5  --   --   --   --   ALBUMIN 3.5  --   --   --   --   AST 33  --   --   --   --   ALT 30  --   --   --   --   ALKPHOS 84  --   --   --   --   BILITOT 0.8  --   --   --   --   GFRNONAA 59*   < > 51* 38* 49*  ANIONGAP  10   < > 7 10 10    < > = values in this interval not displayed.    Lipids No results for input(s): "CHOL", "TRIG", "HDL", "LABVLDL", "LDLCALC", "CHOLHDL" in the last 168 hours.  Hematology Recent Labs  Lab 06/11/22 0916 06/12/22 0535  WBC 8.1 7.9  RBC 4.26 4.24  HGB 11.9* 12.1  HCT 39.1 39.2  MCV 91.8 92.5  MCH 27.9 28.5  MCHC 30.4 30.9  RDW 14.3 14.1  PLT 189 211   Thyroid No results for input(s): "TSH", "FREET4" in the last 168 hours.  BNP Recent Labs  Lab 06/11/22 0916 06/12/22 0535 06/13/22 0821  BNP 1,132.1* 1,501.8* 1,077.2*    DDimer No results for input(s): "DDIMER" in the last 168 hours.   Radiology    No results found.  Cardiac Studies   Echo 06/09/2022  1. Left ventricular ejection fraction, by estimation, is 50 to 55%. The  left ventricle has low normal function. Left ventricular endocardial  border not optimally defined to evaluate regional wall motion. There is  mild concentric left ventricular  hypertrophy. Left ventricular diastolic parameters are indeterminate.   2. Right ventricular systolic function is normal. The right ventricular  size is normal.   3. Left atrial size was moderately dilated.   4. Right atrial size was mildly dilated.   5. The mitral valve is grossly normal. Mild to moderate mitral valve  regurgitation.   6. The aortic valve is tricuspid. There is mild calcification of the  aortic valve. There is mild  thickening of the aortic valve. Aortic valve  regurgitation is mild. Aortic valve sclerosis/calcification is present,  without any evidence of aortic  stenosis.   7. Aortic dilatation noted. There is mild dilatation of the ascending  aorta, measuring 41 mm.   Comparison(s): No significant change from prior study.   Patient Profile     87 y.o. female with PMH of normal cors on cath 08/2017, PAF, chronic diastolic CHF, HTN, HLD, hyperthyroidism, and CKD stage III who presented with acute on chronic diastolic heart failure and recurrent afib.   Assessment & Plan    Acute on chronic diastolic heart failure - Echo 1/61/0960 showed EF 50-55%, mild LVH, normal RV, mildly dilated ascending aorta 41 mm             - bibasilar collapse/consolidation (L>R) noted on initial CXR             - on 40mg  BID IV lasix, LE edema persist. Benefit check Jardiance copay $40, compare to $64 for farxiga. Will add jardiance after cardioversion             - renal function stable with IV diuresis. Hopefully will diureses better after cardioversion.    PAF             - loaded on amiodarone, continue on metoprolol 100mg  BID             - plan to proceed with DCCV today   Hypertension: on metoprolol tatrate 100mg  BID   Hyperlipidemia: on lipitor   CKD stage III: Cr stable      For questions or updates, please contact [facility] Please consult [email or web address] for contact info under        Signed, Azalee Course, PA  06/17/2022, 9:20 AM

## 2022-06-17 NOTE — Evaluation (Signed)
Occupational Therapy Evaluation Patient Details Name: Brenda Matthews MRN: [id number] DOB: [birth year] Today's Date: 06/17/2022   History of Present Illness [name] is an 87 yo female with PMH chronic dCHF, CKD3b, HTN, HLD who presented 06/11/22  with worsening shortness of breath and lower extremity swelling.   Clinical Impression   Pt reports fatigue following cardioversion earlier today. Declined ambulation, but able to demonstrate ability to transfer with supervision. She is overall functioning at a set up to supervision level. She remains on 2L O2. Pt plans to recover in SNF at [facility], she resides in their ILF. Pt typically functions independently in ADLs and walks with a cane when she goes out. Patient will benefit from continued inpatient follow up therapy, <3 hours/day.     Recommendations for follow up therapy are one component of a multi-disciplinary discharge planning process, led by the attending physician.  Recommendations may be updated based on patient status, additional functional criteria and insurance authorization.   Assistance Recommended at Discharge Frequent or constant Supervision/Assistance  Patient can return home with the following A little help with bathing/dressing/bathroom;A little help with walking and/or transfers;Assist for transportation;Help with stairs or ramp for entrance    Functional Status Assessment  Patient has had a recent decline in their functional status and demonstrates the ability to make significant improvements in function in a reasonable and predictable amount of time.  Equipment Recommendations  None recommended by OT    Recommendations for Other Services       Precautions / Restrictions Precautions Precautions: Fall Precaution Comments: monitor sats, on 2L Restrictions Weight Bearing Restrictions: No      Mobility Bed Mobility Overal bed mobility: Modified Independent                  Transfers Overall transfer  level: Needs assistance Equipment used: None Transfers: Sit to/from Stand, Bed to chair/wheelchair/BSC Sit to Stand: Supervision Stand pivot transfers: Supervision         General transfer comment: declined ambulation to bathroom, fatigued      Balance Overall balance assessment: History of Falls, Needs assistance   Sitting balance-Leahy Scale: Good     Standing balance support: During functional activity, Reliant on assistive device for balance, Bilateral upper extremity supported Standing balance-Leahy Scale: Fair                             ADL either performed or assessed with clinical judgement   ADL Overall ADL's : Needs assistance/impaired Eating/Feeding: Independent;Sitting   Grooming: Supervision/safety;Standing   Upper Body Bathing: Set up;Sitting   Lower Body Bathing: Supervison/ safety;Sit to/from stand   Upper Body Dressing : Set up;Sitting   Lower Body Dressing: Supervision/safety;Sit to/from stand   Toilet Transfer: Supervision/safety;BSC/3in1   Toileting- Architect and Hygiene: Set up;Sitting/lateral lean               Vision Ability to See in Adequate Light: 0 Adequate Patient Visual Report: No change from baseline       Perception     Praxis      Pertinent Vitals/Pain Pain Assessment Pain Assessment: No/denies pain     Hand Dominance Right   Extremity/Trunk Assessment Upper Extremity Assessment Upper Extremity Assessment: Overall WFL for tasks assessed   Lower Extremity Assessment Lower Extremity Assessment: Defer to PT evaluation   Cervical / Trunk Assessment Cervical / Trunk Assessment: Kyphotic   Communication Communication Communication: No difficulties   Cognition Arousal/Alertness:  Awake/alert Behavior During Therapy: WFL for tasks assessed/performed Overall Cognitive Status: Within Functional Limits for tasks assessed                                       General  Comments       Exercises     Shoulder Instructions      Home Living Family/patient expects to be discharged to:: Private residence Living Arrangements: Alone Available Help at Discharge: Available PRN/intermittently Type of Home: Independent living facility Home Access: Level entry     Home Layout: One level     Bathroom Shower/Tub: Producer, television/film/video: Handicapped height     Home Equipment: Agricultural consultant (2 wheels);Rollator (4 wheels);Cane - single point   Additional Comments: ILF at [facility]      Prior Functioning/Environment Prior Level of Function : Independent/Modified Independent             Mobility Comments: uses a cane when goes out, some times, Has PT 3 x's/wk          OT Problem List: Decreased activity tolerance;Impaired balance (sitting and/or standing);Cardiopulmonary status limiting activity      OT Treatment/Interventions: Self-care/ADL training;Patient/family education;Energy conservation    OT Goals(Current goals can be found in the care plan section) Acute Rehab OT Goals OT Goal Formulation: With patient Time For Goal Achievement: 07/01/22 Potential to Achieve Goals: Good ADL Goals Pt Will Transfer to Toilet: with modified independence;ambulating;regular height toilet Pt Will Perform Toileting - Clothing Manipulation and hygiene: with modified independence;sit to/from stand Additional ADL Goal #1: Pt will utilize energy conservation strategies and pursed lip breathing techniques during ADLs. Additional ADL Goal #2: Pt will complete basic ADLs with set up.  OT Frequency: Min 2X/week    Co-evaluation              AM-PAC OT "6 Clicks" Daily Activity     Outcome Measure Help from another person eating meals?: None Help from another person taking care of personal grooming?: A Little Help from another person toileting, which includes using toliet, bedpan, or urinal?: A Little Help from another person bathing  (including washing, rinsing, drying)?: A Little Help from another person to put on and taking off regular upper body clothing?: None Help from another person to put on and taking off regular lower body clothing?: A Little 6 Click Score: 20   End of Session    Activity Tolerance: Patient limited by fatigue Patient left: in bed;with call bell/phone within reach;with family/visitor present  OT Visit Diagnosis: Unsteadiness on feet (R26.81);Other (comment);History of falling (Z91.81);Muscle weakness (generalized) (M62.81) (decreased activity tolerance)                Time: 1610-9604 OT Time Calculation (min): 17 min Charges:  OT General Charges $OT Visit: 1 Visit OT Evaluation $OT Eval Low Complexity: 1 Low  Berna Spare, OTR/L Acute Rehabilitation Services Office: [phone]  Evern Bio 06/17/2022, 4:04 PM

## 2022-06-17 NOTE — Progress Notes (Signed)
[facility] PROGRESS NOTE    Progress Note  Brenda Matthews  ZOX:[id number] DOB: Oct 12, 1935 DOA: 06/11/2022 PCP: Jeoffrey Massed, MD     Brief Narrative:   Brenda Matthews is an 87 y.o. female past medical history of chronic diastolic heart failure, chronic kidney disease stage IIIb, paroxysmal atrial fibrillation essential hypertension presented with worsening shortness of breath and lower extremity edema was found hypoxic in the ED being treated for acute diastolic heart failure and paroxysmal atrial fibrillation  Assessment/Plan:   Acute respiratory failure with hypoxia secondary to acute on chronic diastolic CHF (congestive heart failure) (HCC) Continue IV diuresis she is negative about 6 L. Still on 2 L to keep saturations greater than 92%. Try to wean to room air out of bed to chair she should start working with physical therapy. Continue IV Lasix, her creatinine has improved. Going for cardioversion today . Creatinine and potassium has remained stable.  Paroxysmal atrial fibrillation: Rate controlled on amiodarone metoprolol and Eliquis. Cardiology recommended DCCV on 06/17/2022.  Chronic kidney disease stage IIIb: With a baseline creatinine of 1.1-1.2 has remained at baseline.  Physical deconditioning: PT evaluated the patient, she will need skilled nursing facility.  Hyperlipidemia: Continue Lipitor.  Essential hypertension: Holding amlodipine and hydralazine    DVT prophylaxis: Eliquis Family Communication:none Status is: Inpatient Remains inpatient appropriate because: Acute respiratory failure with hypoxia due to acute diastolic heart failure    Code Status:     Code Status Orders  (From admission, onward)           Start     Ordered   06/11/22 1224  Full code  Continuous       Question:  By:  Answer:  Consent: discussion documented in EHR   06/11/22 1225           Code Status History     Date Active Date Inactive Code Status Order ID  Comments User Context   09/14/2020 0945 09/20/2020 0204 Full Code [id number]  Marlow Baars, MD Inpatient   09/26/2018 1310 09/27/2018 1831 Full Code [id number]  Ranee Gosselin, MD Inpatient   08/29/2017 1438 09/11/2017 2152 Full Code [id number]  Gwenyth Bender, NP ED   02/18/2016 1952 02/20/2016 2005 Full Code [id number]  Toni Arthurs, MD Inpatient      Advance Directive Documentation    Flowsheet Row Most Recent Value  Type of Advance Directive Healthcare Power of Attorney  Pre-existing out of facility DNR order (yellow form or pink MOST form) --  "MOST" Form in Place? --         IV Access:   Peripheral IV   Procedures and diagnostic studies:   No results found.   Medical Consultants:   None.   Subjective:    Brenda Matthews had a good night rest  Objective:    Vitals:   06/16/22 1841 06/16/22 2046 06/17/22 0406 06/17/22 0726  BP: 130/77 126/79 114/78   Pulse: 99 83 74   Resp: (!) 24 18 18    Temp: 97.6 F (36.4 C) (!) 97.5 F (36.4 C) 97.7 F (36.5 C)   TempSrc: Oral Oral Oral   SpO2: 97% 97% 97%   Weight:    84.6 kg  Height:       SpO2: 97 % O2 Flow Rate (L/min): 2 L/min   Intake/Output Summary (Last 24 hours) at 06/17/2022 0808 Last data filed at 06/17/2022 0500 Gross per 24 hour  Intake 240 ml  Output 1950 ml  Net -1710 ml  Filed Weights   06/11/22 0818 06/11/22 1434 06/17/22 0726  Weight: 84.1 kg 84.8 kg 84.6 kg    Exam: General exam: In no acute distress. Respiratory system: Good air movement and clear to auscultation. Cardiovascular system: S1 & S2 heard, RRR.  Positive JVD Gastrointestinal system: Abdomen is nondistended, soft and nontender.  Extremities: 3+ edema Skin: No rashes, lesions or ulcers Psychiatry: Judgement and insight appear normal. Mood & affect appropriate. Data Reviewed:    Labs: Basic Metabolic Panel: Recent Labs  Lab 06/13/22 0821 06/14/22 0517 06/15/22 0506 06/16/22 0510 06/17/22 0530  NA 139 136 141 138 140   K 3.9 3.7 4.0 3.9 4.0  CL 107 107 108 103 104  CO2 23 23 26 25 26   GLUCOSE 128* 104* 110* 99 107*  BUN 33* 30* 36* 41* 35*  CREATININE 1.07* 1.00 1.07* 1.36* 1.11*  CALCIUM 8.8* 8.4* 8.9 8.9 9.2  MG 2.2 2.1 2.2 2.2 2.2    GFR Estimated Creatinine Clearance: 36.6 mL/min (A) (by C-G formula based on SCr of 1.11 mg/dL (H)). Liver Function Tests: Recent Labs  Lab 06/11/22 0916  AST 33  ALT 30  ALKPHOS 84  BILITOT 0.8  PROT 6.5  ALBUMIN 3.5    No results for input(s): "LIPASE", "AMYLASE" in the last 168 hours. No results for input(s): "AMMONIA" in the last 168 hours. Coagulation profile No results for input(s): "INR", "PROTIME" in the last 168 hours. COVID-19 Labs  No results for input(s): "DDIMER", "FERRITIN", "LDH", "CRP" in the last 72 hours.  Lab Results  Component Value Date   SARSCOV2NAA NEGATIVE 09/19/2020   SARSCOV2NAA NEGATIVE 09/14/2020   SARSCOV2NAA NOT DETECTED 09/22/2018    CBC: Recent Labs  Lab 06/11/22 0916 06/12/22 0535  WBC 8.1 7.9  NEUTROABS 6.1  --   HGB 11.9* 12.1  HCT 39.1 39.2  MCV 91.8 92.5  PLT 189 211    Cardiac Enzymes: No results for input(s): "CKTOTAL", "CKMB", "CKMBINDEX", "TROPONINI" in the last 168 hours. BNP (last 3 results) No results for input(s): "PROBNP" in the last 8760 hours. CBG: No results for input(s): "GLUCAP" in the last 168 hours. D-Dimer: No results for input(s): "DDIMER" in the last 72 hours. Hgb A1c: No results for input(s): "HGBA1C" in the last 72 hours. Lipid Profile: No results for input(s): "CHOL", "HDL", "LDLCALC", "TRIG", "CHOLHDL", "LDLDIRECT" in the last 72 hours. Thyroid function studies: No results for input(s): "TSH", "T4TOTAL", "T3FREE", "THYROIDAB" in the last 72 hours.  Invalid input(s): "FREET3" Anemia work up: No results for input(s): "VITAMINB12", "FOLATE", "FERRITIN", "TIBC", "IRON", "RETICCTPCT" in the last 72 hours. Sepsis Labs: Recent Labs  Lab 06/11/22 0916 06/12/22 0535  WBC  8.1 7.9    Microbiology No results found for this or any previous visit (from the past 240 hour(s)).   Medications:    amiodarone  200 mg Oral BID   apixaban  5 mg Oral BID   atorvastatin  40 mg Oral Daily   furosemide  40 mg Intravenous BID   metoprolol tartrate  100 mg Oral BID   potassium chloride  20 mEq Oral BID   Continuous Infusions:  sodium chloride        LOS: 5 days   Marinda Elk  [facility]  06/17/2022, 8:08 AM

## 2022-06-17 NOTE — Transfer of Care (Signed)
Immediate Anesthesia Transfer of Care Note  Patient: Brenda Matthews  Procedure(s) Performed: CARDIOVERSION  Patient Location: Cath Lab  Anesthesia Type:General  Level of Consciousness: drowsy  Airway & Oxygen Therapy: Patient Spontanous Breathing and Patient connected to nasal cannula oxygen  Post-op Assessment: Report given to RN, Post -op Vital signs reviewed and stable, and Patient moving all extremities X 4  Post vital signs: Reviewed and stable  Last Vitals:  Vitals Value Taken Time  BP 107/72 06/17/22 1140  Temp    Pulse 58 06/17/22 1141  Resp 26 06/17/22 1141  SpO2 92 % 06/17/22 1141  Vitals shown include unvalidated device data.  Last Pain:  Vitals:   06/17/22 1031  TempSrc: Temporal  PainSc:       Patients Stated Pain Goal: 0 (06/12/22 2342)  Complications: No notable events documented.

## 2022-06-18 DIAGNOSIS — I5033 Acute on chronic diastolic (congestive) heart failure: Secondary | ICD-10-CM | POA: Diagnosis not present

## 2022-06-18 DIAGNOSIS — N1832 Chronic kidney disease, stage 3b: Secondary | ICD-10-CM | POA: Diagnosis not present

## 2022-06-18 DIAGNOSIS — I509 Heart failure, unspecified: Secondary | ICD-10-CM | POA: Diagnosis not present

## 2022-06-18 DIAGNOSIS — R0602 Shortness of breath: Secondary | ICD-10-CM | POA: Diagnosis not present

## 2022-06-18 LAB — BASIC METABOLIC PANEL
Anion gap: 7 (ref 5–15)
BUN: 37 mg/dL — ABNORMAL HIGH (ref 8–23)
CO2: 30 mmol/L (ref 22–32)
Calcium: 9 mg/dL (ref 8.9–10.3)
Chloride: 102 mmol/L (ref 98–111)
Creatinine, Ser: 1.24 mg/dL — ABNORMAL HIGH (ref 0.44–1.00)
GFR, Estimated: 43 mL/min — ABNORMAL LOW (ref >60)
Glucose, Bld: 99 mg/dL (ref 70–99)
Potassium: 3.9 mmol/L (ref 3.5–5.1)
Sodium: 139 mmol/L (ref 135–145)

## 2022-06-18 LAB — MAGNESIUM: Magnesium: 2.3 mg/dL (ref 1.7–2.4)

## 2022-06-18 MED ORDER — METOPROLOL TARTRATE 50 MG PO TABS
50.0000 mg | ORAL_TABLET | Freq: Two times a day (BID) | ORAL | Status: DC
  Administered 2022-06-18 – 2022-06-20 (×5): 50 mg via ORAL
  Filled 2022-06-18 (×5): qty 1

## 2022-06-18 NOTE — Progress Notes (Signed)
Progress Note  Patient Name: Brenda Matthews Date of Encounter: 06/18/2022  Primary Cardiologist:   Chrystie Nose, MD   Subjective   She thinks that she is breathing better but clearly not at baseline.  No chest pain.    Inpatient Medications    Scheduled Meds:  amiodarone  200 mg Oral BID   apixaban  5 mg Oral BID   atorvastatin  40 mg Oral Daily   furosemide  40 mg Intravenous BID   metoprolol tartrate  100 mg Oral BID   potassium chloride  20 mEq Oral BID   Continuous Infusions:  PRN Meds: acetaminophen **OR** acetaminophen, albuterol, ALPRAZolam, hydrALAZINE, Muscle Rub, ondansetron **OR** ondansetron (ZOFRAN) IV, traZODone   Vital Signs    Vitals:   06/17/22 2035 06/18/22 0049 06/18/22 0500 06/18/22 0551  BP: 137/76 111/75  (!) 141/72  Pulse: 63 (!) 55  (!) 54  Resp: 18 20  20   Temp: 98.2 F (36.8 C) (!) 97.4 F (36.3 C)  (!) 97.3 F (36.3 C)  TempSrc: Oral Oral  Oral  SpO2: 97% 95%  96%  Weight:   85.1 kg   Height:        Intake/Output Summary (Last 24 hours) at 06/18/2022 0759 Last data filed at 06/18/2022 0544 Gross per 24 hour  Intake 440 ml  Output 800 ml  Net -360 ml   Filed Weights   06/17/22 0726 06/17/22 1031 06/18/22 0500  Weight: 84.6 kg 79.4 kg 85.1 kg    Telemetry    NSR - Personally Reviewed  ECG    NA - Personally Reviewed  Physical Exam   GEN: No acute distress.   Neck: No  JVD Cardiac: RRR, no murmurs, rubs, or gallops.  Respiratory:     Decreased breath sounds with diffuse wheezing GI: Soft, nontender, non-distended  MS:    Mild edema; No deformity. Neuro:  Nonfocal  Psych: Normal affect   Labs    Chemistry Recent Labs  Lab 06/11/22 0916 06/12/22 0535 06/16/22 0510 06/17/22 0530 06/18/22 0540  NA 141   < > 138 140 139  K 3.9   < > 3.9 4.0 3.9  CL 110   < > 103 104 102  CO2 21*   < > 25 26 30   GLUCOSE 120*   < > 99 107* 99  BUN 27*   < > 41* 35* 37*  CREATININE 0.94   < > 1.36* 1.11* 1.24*  CALCIUM 8.8*    < > 8.9 9.2 9.0  PROT 6.5  --   --   --   --   ALBUMIN 3.5  --   --   --   --   AST 33  --   --   --   --   ALT 30  --   --   --   --   ALKPHOS 84  --   --   --   --   BILITOT 0.8  --   --   --   --   GFRNONAA 59*   < > 38* 49* 43*  ANIONGAP 10   < > 10 10 7    < > = values in this interval not displayed.     Hematology Recent Labs  Lab 06/11/22 0916 06/12/22 0535  WBC 8.1 7.9  RBC 4.26 4.24  HGB 11.9* 12.1  HCT 39.1 39.2  MCV 91.8 92.5  MCH 27.9 28.5  MCHC 30.4 30.9  RDW 14.3 14.1  PLT 189 211    Cardiac EnzymesNo results for input(s): "TROPONINI" in the last 168 hours. No results for input(s): "TROPIPOC" in the last 168 hours.   BNP Recent Labs  Lab 06/11/22 0916 06/12/22 0535 06/13/22 0821  BNP 1,132.1* 1,501.8* 1,077.2*     DDimer No results for input(s): "DDIMER" in the last 168 hours.   Radiology    EP STUDY  Result Date: 06/17/2022 See surgical note for result.   Cardiac Studies   See elsewhere.  EF 50 - 55%.    Patient Profile     87 y.o. female with PMH of normal cors on cath 08/2017, PAF, chronic diastolic CHF, HTN, HLD, hyperthyroidism, and CKD stage III who presented with acute on chronic diastolic heart failure and recurrent afib.     Assessment & Plan    Acute on chronic diastolic heart failure:   Net negative 6.5 liters.  Jardiance added.   Creat is stable.    Weight does not seem to be changed.   Continue IV diuresis.    PAF:  Now status post cardioversion.  Maintaining NSR.  Continue Eliquis and PO amiodarone.  Rate is in the 50s.    I will order beta blocker at lower dose since sinus rhythm is in the 50s.   Hypertension:  Med change as above.    Hyperlipidemia:   Continue current therapy.      CKD stage III:   Creat is up slowly but within range.  Follow closely with continued diuresis.    For questions or updates, please contact [facility] Please consult [email or web address] for contact info under Cardiology/STEMI.   Signed, Rollene Rotunda, MD  06/18/2022, 7:59 AM

## 2022-06-18 NOTE — Progress Notes (Signed)
[facility] PROGRESS NOTE    Progress Note  Brenda Matthews  ZOX:[id number] DOB: Feb 27, 1935 DOA: 06/11/2022 PCP: Jeoffrey Massed, MD     Brief Narrative:   Brenda Matthews is an 87 y.o. female past medical history of chronic diastolic heart failure, chronic kidney disease stage IIIb, paroxysmal atrial fibrillation essential hypertension presented with worsening shortness of breath and lower extremity edema was found hypoxic in the ED being treated for acute diastolic heart failure and paroxysmal atrial fibrillation  Assessment/Plan:   Acute respiratory failure with hypoxia secondary to acute on chronic diastolic CHF (congestive heart failure) (HCC) Continue IV diuresis has plateaued, negative about 6-1/2 L Continues to require 3 L of oxygen to keep saturations greater 92%, try to wean to room air. PT eval is pending.  Status post cardioversion. Cardiology on board recommended to continue IV diuresis. Continue to monitor electrolytes and creatinine. PT evaluated the patient will need skilled nursing facility. Try to wean to her air.  Paroxysmal atrial fibrillation: Now in sinus rhythm , on amiodarone metoprolol and Eliquis. Cardiology recommended DCCV on 06/17/2022. Further management per cardiology.  Chronic kidney disease stage IIIb: With a baseline creatinine of around 1.1-1.2 has remained at baseline.  Physical deconditioning: PT evaluated the patient, she will need skilled nursing facility.  Hyperlipidemia: Continue Lipitor.  Essential hypertension: Holding amlodipine and hydralazine    DVT prophylaxis: Eliquis Family Communication:none Status is: Inpatient Remains inpatient appropriate because: Acute respiratory failure with hypoxia due to acute diastolic heart failure    Code Status:     Code Status Orders  (From admission, onward)           Start     Ordered   06/11/22 1224  Full code  Continuous       Question:  By:  Answer:  Consent: discussion  documented in EHR   06/11/22 1225           Code Status History     Date Active Date Inactive Code Status Order ID Comments User Context   09/14/2020 0945 09/20/2020 0204 Full Code [id number]  Marlow Baars, MD Inpatient   09/26/2018 1310 09/27/2018 1831 Full Code [id number]  Ranee Gosselin, MD Inpatient   08/29/2017 1438 09/11/2017 2152 Full Code [id number]  Gwenyth Bender, NP ED   02/18/2016 1952 02/20/2016 2005 Full Code [id number]  Toni Arthurs, MD Inpatient      Advance Directive Documentation    Flowsheet Row Most Recent Value  Type of Advance Directive Healthcare Power of Attorney  Pre-existing out of facility DNR order (yellow form or pink MOST form) --  "MOST" Form in Place? --         IV Access:   Peripheral IV   Procedures and diagnostic studies:   EP STUDY  Result Date: 06/17/2022 See surgical note for result.    Medical Consultants:   None.   Subjective:    Brenda Matthews had a good night rest, she relates her breathing is about the same.  Objective:    Vitals:   06/17/22 2035 06/18/22 0049 06/18/22 0500 06/18/22 0551  BP: 137/76 111/75  (!) 141/72  Pulse: 63 (!) 55  (!) 54  Resp: 18 20  20   Temp: 98.2 F (36.8 C) (!) 97.4 F (36.3 C)  (!) 97.3 F (36.3 C)  TempSrc: Oral Oral  Oral  SpO2: 97% 95%  96%  Weight:   85.1 kg   Height:       SpO2: 96 % O2  Flow Rate (L/min): 2 L/min   Intake/Output Summary (Last 24 hours) at 06/18/2022 0856 Last data filed at 06/18/2022 0544 Gross per 24 hour  Intake 440 ml  Output 800 ml  Net -360 ml    Filed Weights   06/17/22 0726 06/17/22 1031 06/18/22 0500  Weight: 84.6 kg 79.4 kg 85.1 kg    Exam: General exam: In no acute distress. Respiratory system: Good air movement and clear to auscultation. Cardiovascular system: S1 & S2 heard, RRR.  Positive JVD Gastrointestinal system: Abdomen is nondistended, soft and nontender.  Extremities: 2+ edema Skin: No rashes, lesions or ulcers Psychiatry: Judgement  and insight appear normal. Mood & affect appropriate. Data Reviewed:    Labs: Basic Metabolic Panel: Recent Labs  Lab 06/14/22 0517 06/15/22 0506 06/16/22 0510 06/17/22 0530 06/18/22 0540  NA 136 141 138 140 139  K 3.7 4.0 3.9 4.0 3.9  CL 107 108 103 104 102  CO2 23 26 25 26 30   GLUCOSE 104* 110* 99 107* 99  BUN 30* 36* 41* 35* 37*  CREATININE 1.00 1.07* 1.36* 1.11* 1.24*  CALCIUM 8.4* 8.9 8.9 9.2 9.0  MG 2.1 2.2 2.2 2.2 2.3    GFR Estimated Creatinine Clearance: 32.8 mL/min (A) (by C-G formula based on SCr of 1.24 mg/dL (H)). Liver Function Tests: Recent Labs  Lab 06/11/22 0916  AST 33  ALT 30  ALKPHOS 84  BILITOT 0.8  PROT 6.5  ALBUMIN 3.5    No results for input(s): "LIPASE", "AMYLASE" in the last 168 hours. No results for input(s): "AMMONIA" in the last 168 hours. Coagulation profile No results for input(s): "INR", "PROTIME" in the last 168 hours. COVID-19 Labs  No results for input(s): "DDIMER", "FERRITIN", "LDH", "CRP" in the last 72 hours.  Lab Results  Component Value Date   SARSCOV2NAA NEGATIVE 09/19/2020   SARSCOV2NAA NEGATIVE 09/14/2020   SARSCOV2NAA NOT DETECTED 09/22/2018    CBC: Recent Labs  Lab 06/11/22 0916 06/12/22 0535  WBC 8.1 7.9  NEUTROABS 6.1  --   HGB 11.9* 12.1  HCT 39.1 39.2  MCV 91.8 92.5  PLT 189 211    Cardiac Enzymes: No results for input(s): "CKTOTAL", "CKMB", "CKMBINDEX", "TROPONINI" in the last 168 hours. BNP (last 3 results) No results for input(s): "PROBNP" in the last 8760 hours. CBG: No results for input(s): "GLUCAP" in the last 168 hours. D-Dimer: No results for input(s): "DDIMER" in the last 72 hours. Hgb A1c: No results for input(s): "HGBA1C" in the last 72 hours. Lipid Profile: No results for input(s): "CHOL", "HDL", "LDLCALC", "TRIG", "CHOLHDL", "LDLDIRECT" in the last 72 hours. Thyroid function studies: No results for input(s): "TSH", "T4TOTAL", "T3FREE", "THYROIDAB" in the last 72  hours.  Invalid input(s): "FREET3" Anemia work up: No results for input(s): "VITAMINB12", "FOLATE", "FERRITIN", "TIBC", "IRON", "RETICCTPCT" in the last 72 hours. Sepsis Labs: Recent Labs  Lab 06/11/22 0916 06/12/22 0535  WBC 8.1 7.9    Microbiology No results found for this or any previous visit (from the past 240 hour(s)).   Medications:    amiodarone  200 mg Oral BID   apixaban  5 mg Oral BID   atorvastatin  40 mg Oral Daily   furosemide  40 mg Intravenous BID   metoprolol tartrate  50 mg Oral BID   potassium chloride  20 mEq Oral BID   Continuous Infusions:     LOS: 6 days   Marinda Elk  [facility]  06/18/2022, 8:56 AM

## 2022-06-19 DIAGNOSIS — I5033 Acute on chronic diastolic (congestive) heart failure: Secondary | ICD-10-CM | POA: Diagnosis not present

## 2022-06-19 DIAGNOSIS — R0602 Shortness of breath: Secondary | ICD-10-CM | POA: Diagnosis not present

## 2022-06-19 DIAGNOSIS — I509 Heart failure, unspecified: Secondary | ICD-10-CM | POA: Diagnosis not present

## 2022-06-19 DIAGNOSIS — N1832 Chronic kidney disease, stage 3b: Secondary | ICD-10-CM | POA: Diagnosis not present

## 2022-06-19 MED ORDER — METOLAZONE 2.5 MG PO TABS
2.5000 mg | ORAL_TABLET | Freq: Once | ORAL | Status: AC
  Administered 2022-06-19: 2.5 mg via ORAL
  Filled 2022-06-19: qty 1

## 2022-06-19 MED ORDER — FUROSEMIDE 10 MG/ML IJ SOLN
60.0000 mg | Freq: Two times a day (BID) | INTRAMUSCULAR | Status: DC
  Administered 2022-06-19 – 2022-06-20 (×3): 60 mg via INTRAVENOUS
  Filled 2022-06-19 (×3): qty 6

## 2022-06-19 MED ORDER — GUAIFENESIN-DM 100-10 MG/5ML PO SYRP
5.0000 mL | ORAL_SOLUTION | ORAL | Status: DC | PRN
  Administered 2022-06-19: 5 mL via ORAL
  Filled 2022-06-19: qty 10

## 2022-06-19 NOTE — Progress Notes (Signed)
[facility] PROGRESS NOTE    Progress Note  Brenda Matthews  ZOX:[id number] DOB: Jun 06, 1935 DOA: 06/11/2022 PCP: Jeoffrey Massed, MD     Brief Narrative:   Brenda Matthews is an 87 y.o. female past medical history of chronic diastolic heart failure, chronic kidney disease stage IIIb, paroxysmal atrial fibrillation essential hypertension presented with worsening shortness of breath and lower extremity edema was found hypoxic in the ED being treated for acute diastolic heart failure and paroxysmal atrial fibrillation  Assessment/Plan:   Acute respiratory failure with hypoxia secondary to acute on chronic diastolic CHF (congestive heart failure) (HCC) Continue IV diuresis has plateaued, negative about 6-1/2 L Now on 3 L of oxygen to keep saturation greater than 100%. Cardiology was consulted they have increased her Lasix and she was given a small dose of Zaroxolyn. The evaluated the patient recommended skilled nursing facility. Continue to monitor electrolytes and creatinine. Try to wean to her air.  Paroxysmal atrial fibrillation: Now in sinus rhythm , on amiodarone metoprolol and Eliquis. Cardiology recommended DCCV on 06/17/2022. Further management per cardiology.  Chronic kidney disease stage IIIb: With a baseline creatinine of around 1.1-1.2 has remained at baseline.  Physical deconditioning: PT evaluated the patient, she will need skilled nursing facility.  Hyperlipidemia: Continue Lipitor.  Essential hypertension: Holding amlodipine and hydralazine    DVT prophylaxis: Eliquis Family Communication:none Status is: Inpatient Remains inpatient appropriate because: Acute respiratory failure with hypoxia due to acute diastolic heart failure    Code Status:     Code Status Orders  (From admission, onward)           Start     Ordered   06/11/22 1224  Full code  Continuous       Question:  By:  Answer:  Consent: discussion documented in EHR   06/11/22 1225            Code Status History     Date Active Date Inactive Code Status Order ID Comments User Context   09/14/2020 0945 09/20/2020 0204 Full Code [id number]  Marlow Baars, MD Inpatient   09/26/2018 1310 09/27/2018 1831 Full Code [id number]  Ranee Gosselin, MD Inpatient   08/29/2017 1438 09/11/2017 2152 Full Code [id number]  Gwenyth Bender, NP ED   02/18/2016 1952 02/20/2016 2005 Full Code [id number]  Toni Arthurs, MD Inpatient      Advance Directive Documentation    Flowsheet Row Most Recent Value  Type of Advance Directive Healthcare Power of Attorney  Pre-existing out of facility DNR order (yellow form or pink MOST form) --  "MOST" Form in Place? --         IV Access:   Peripheral IV   Procedures and diagnostic studies:   EP STUDY  Result Date: 06/17/2022 See surgical note for result.    Medical Consultants:   None.   Subjective:    Brenda Matthews feels about the same had a good night rest.  Objective:    Vitals:   06/18/22 2045 06/18/22 2203 06/19/22 0500 06/19/22 0629  BP: 136/66 137/72  135/65  Pulse: 65 62  66  Resp: 18   19  Temp: 97.7 F (36.5 C)   98.1 F (36.7 C)  TempSrc: Oral   Oral  SpO2: 95%   94%  Weight:   83.8 kg   Height:       SpO2: 94 % O2 Flow Rate (L/min): 2 L/min   Intake/Output Summary (Last 24 hours) at 06/19/2022 0845 Last data filed  at 06/19/2022 0019 Gross per 24 hour  Intake 998 ml  Output 1100 ml  Net -102 ml    Filed Weights   06/17/22 1031 06/18/22 0500 06/19/22 0500  Weight: 79.4 kg 85.1 kg 83.8 kg    Exam: General exam: In no acute distress. Respiratory system: Good air movement and clear to auscultation. Cardiovascular system: S1 & S2 heard, RRR. No JVD. Gastrointestinal system: Abdomen is nondistended, soft and nontender.  Extremities: 2+ edema Skin: No rashes, lesions or ulcers Psychiatry: Judgement and insight appear normal. Mood & affect appropriate. Data Reviewed:    Labs: Basic Metabolic  Panel: Recent Labs  Lab 06/14/22 0517 06/15/22 0506 06/16/22 0510 06/17/22 0530 06/18/22 0540  NA 136 141 138 140 139  K 3.7 4.0 3.9 4.0 3.9  CL 107 108 103 104 102  CO2 23 26 25 26 30   GLUCOSE 104* 110* 99 107* 99  BUN 30* 36* 41* 35* 37*  CREATININE 1.00 1.07* 1.36* 1.11* 1.24*  CALCIUM 8.4* 8.9 8.9 9.2 9.0  MG 2.1 2.2 2.2 2.2 2.3    GFR Estimated Creatinine Clearance: 32.6 mL/min (A) (by C-G formula based on SCr of 1.24 mg/dL (H)). Liver Function Tests: No results for input(s): "AST", "ALT", "ALKPHOS", "BILITOT", "PROT", "ALBUMIN" in the last 168 hours.  No results for input(s): "LIPASE", "AMYLASE" in the last 168 hours. No results for input(s): "AMMONIA" in the last 168 hours. Coagulation profile No results for input(s): "INR", "PROTIME" in the last 168 hours. COVID-19 Labs  No results for input(s): "DDIMER", "FERRITIN", "LDH", "CRP" in the last 72 hours.  Lab Results  Component Value Date   SARSCOV2NAA NEGATIVE 09/19/2020   SARSCOV2NAA NEGATIVE 09/14/2020   SARSCOV2NAA NOT DETECTED 09/22/2018    CBC: No results for input(s): "WBC", "NEUTROABS", "HGB", "HCT", "MCV", "PLT" in the last 168 hours.  Cardiac Enzymes: No results for input(s): "CKTOTAL", "CKMB", "CKMBINDEX", "TROPONINI" in the last 168 hours. BNP (last 3 results) No results for input(s): "PROBNP" in the last 8760 hours. CBG: No results for input(s): "GLUCAP" in the last 168 hours. D-Dimer: No results for input(s): "DDIMER" in the last 72 hours. Hgb A1c: No results for input(s): "HGBA1C" in the last 72 hours. Lipid Profile: No results for input(s): "CHOL", "HDL", "LDLCALC", "TRIG", "CHOLHDL", "LDLDIRECT" in the last 72 hours. Thyroid function studies: No results for input(s): "TSH", "T4TOTAL", "T3FREE", "THYROIDAB" in the last 72 hours.  Invalid input(s): "FREET3" Anemia work up: No results for input(s): "VITAMINB12", "FOLATE", "FERRITIN", "TIBC", "IRON", "RETICCTPCT" in the last 72  hours. Sepsis Labs: No results for input(s): "PROCALCITON", "WBC", "LATICACIDVEN" in the last 168 hours.  Microbiology No results found for this or any previous visit (from the past 240 hour(s)).   Medications:    amiodarone  200 mg Oral BID   apixaban  5 mg Oral BID   atorvastatin  40 mg Oral Daily   furosemide  60 mg Intravenous BID   metoprolol tartrate  50 mg Oral BID   potassium chloride  20 mEq Oral BID   Continuous Infusions:     LOS: 7 days   Marinda Elk  [facility]  06/19/2022, 8:45 AM

## 2022-06-19 NOTE — Plan of Care (Signed)
  Problem: Education: Goal: Knowledge of General Education information will improve Description: Including pain rating scale, medication(s)/side effects and non-pharmacologic comfort measures Outcome: Progressing   Problem: Clinical Measurements: Goal: Respiratory complications will improve Outcome: Progressing Goal: Cardiovascular complication will be avoided Outcome: Progressing   Problem: Activity: Goal: Risk for activity intolerance will decrease Outcome: Progressing   Problem: Coping: Goal: Level of anxiety will decrease Outcome: Progressing   

## 2022-06-19 NOTE — Progress Notes (Signed)
   Patient Name: Brenda Matthews Date of Encounter: 06/19/2022 [facility] Cardiologist: Chrystie Nose, MD   Interval Summary   She is still SOB and is on 3 liters.  No pain.   Vital Signs   Vitals:   06/18/22 2045 06/18/22 2203 06/19/22 0500 06/19/22 0629  BP: 136/66 137/72  135/65  Pulse: 65 62  66  Resp: 18   19  Temp: 97.7 F (36.5 C)   98.1 F (36.7 C)  TempSrc: Oral   Oral  SpO2: 95%   94%  Weight:   83.8 kg   Height:        Intake/Output Summary (Last 24 hours) at 06/19/2022 0739 Last data filed at 06/19/2022 0019 Gross per 24 hour  Intake 998 ml  Output 1100 ml  Net -102 ml      06/19/2022    5:00 AM 06/18/2022    5:00 AM 06/17/2022   10:31 AM  Last 3 Weights  Weight (lbs) 184 lb 11.2 oz 187 lb 9.8 oz 175 lb  Weight (kg) 83.779 kg 85.1 kg 79.379 kg      Telemetry/ECG    NSR, SB - Personally Reviewed  Physical Exam  GEN: No acute distress.   Neck: No JVD Cardiac: RRR, soft apical  murmurs, rubs, or gallops.  Respiratory: Clear to auscultation bilaterally. GI: Soft, nontender, non-distended  MS: Moderate leg edema  Assessment & Plan    Acute on chronic diastolic heart failure:   Net negative 6.6 liters.   Creat continues to tolerate diuresis.  I will increase diuretic to 60 mg PO bid.  Might need zaroxolyn if creat tolerates   PAF:  Now status post cardioversion.  Maintaining NSR.  Continue Eliquis and PO amiodarone.  Beta blocker dose reduced yesterday and she is tolerating the current lower dose with rates in the  .     Hypertension:  BP at target.     Hyperlipidemia:   Continue current therapy.      CKD stage III:   Creat stable as ablve.    For questions or updates, please contact  [facility] Please consult [email or web address] for contact info under   Signed, Rollene Rotunda, MD

## 2022-06-20 ENCOUNTER — Inpatient Hospital Stay (HOSPITAL_COMMUNITY): Payer: Medicare PPO

## 2022-06-20 DIAGNOSIS — N1832 Chronic kidney disease, stage 3b: Secondary | ICD-10-CM | POA: Diagnosis not present

## 2022-06-20 DIAGNOSIS — I5033 Acute on chronic diastolic (congestive) heart failure: Secondary | ICD-10-CM | POA: Diagnosis not present

## 2022-06-20 DIAGNOSIS — R0602 Shortness of breath: Secondary | ICD-10-CM | POA: Diagnosis not present

## 2022-06-20 DIAGNOSIS — I480 Paroxysmal atrial fibrillation: Secondary | ICD-10-CM | POA: Diagnosis not present

## 2022-06-20 DIAGNOSIS — I509 Heart failure, unspecified: Secondary | ICD-10-CM | POA: Diagnosis not present

## 2022-06-20 LAB — BASIC METABOLIC PANEL
Anion gap: 9 (ref 5–15)
BUN: 33 mg/dL — ABNORMAL HIGH (ref 8–23)
CO2: 31 mmol/L (ref 22–32)
Calcium: 9 mg/dL (ref 8.9–10.3)
Chloride: 98 mmol/L (ref 98–111)
Creatinine, Ser: 1.23 mg/dL — ABNORMAL HIGH (ref 0.44–1.00)
GFR, Estimated: 43 mL/min — ABNORMAL LOW (ref >60)
Glucose, Bld: 100 mg/dL — ABNORMAL HIGH (ref 70–99)
Potassium: 3.4 mmol/L — ABNORMAL LOW (ref 3.5–5.1)
Sodium: 138 mmol/L (ref 135–145)

## 2022-06-20 LAB — GLUCOSE, CAPILLARY
Glucose-Capillary: 95 mg/dL (ref 70–99)
Glucose-Capillary: 126 mg/dL — ABNORMAL HIGH (ref 70–99)
Glucose-Capillary: 88 mg/dL (ref 70–99)

## 2022-06-20 MED ORDER — DOXYCYCLINE HYCLATE 100 MG PO TABS
100.0000 mg | ORAL_TABLET | Freq: Two times a day (BID) | ORAL | Status: AC
  Administered 2022-06-20 – 2022-06-22 (×6): 100 mg via ORAL
  Filled 2022-06-20 (×6): qty 1

## 2022-06-20 MED ORDER — POTASSIUM CHLORIDE 20 MEQ PO PACK
20.0000 meq | PACK | Freq: Two times a day (BID) | ORAL | Status: DC
  Administered 2022-06-21 – 2022-06-22 (×4): 20 meq via ORAL
  Filled 2022-06-20 (×4): qty 1

## 2022-06-20 MED ORDER — METOPROLOL TARTRATE 50 MG PO TABS
50.0000 mg | ORAL_TABLET | Freq: Four times a day (QID) | ORAL | Status: DC
  Administered 2022-06-20 – 2022-06-23 (×10): 50 mg via ORAL
  Filled 2022-06-20 (×12): qty 1

## 2022-06-20 MED ORDER — POTASSIUM CHLORIDE CRYS ER 20 MEQ PO TBCR
40.0000 meq | EXTENDED_RELEASE_TABLET | Freq: Two times a day (BID) | ORAL | Status: AC
  Administered 2022-06-20 (×2): 40 meq via ORAL
  Filled 2022-06-20 (×2): qty 2

## 2022-06-20 MED ORDER — SPIRONOLACTONE 12.5 MG HALF TABLET
12.5000 mg | ORAL_TABLET | Freq: Every day | ORAL | Status: DC
  Administered 2022-06-20 – 2022-06-21 (×2): 12.5 mg via ORAL
  Filled 2022-06-20 (×3): qty 1

## 2022-06-20 MED ORDER — FUROSEMIDE 10 MG/ML IJ SOLN
80.0000 mg | Freq: Two times a day (BID) | INTRAMUSCULAR | Status: DC
  Administered 2022-06-20 – 2022-06-21 (×2): 80 mg via INTRAVENOUS
  Filled 2022-06-20 (×2): qty 8

## 2022-06-20 NOTE — Progress Notes (Signed)
[facility] PROGRESS NOTE    Progress Note  Brenda Matthews  ZDG:[id number] DOB: [birth year] DOA: 06/11/2022 PCP: Jeoffrey Massed, MD     Brief Narrative:   Brenda Matthews is an 87 y.o. female past medical history of chronic diastolic heart failure, chronic kidney disease stage IIIb, paroxysmal atrial fibrillation essential hypertension presented with worsening shortness of breath and lower extremity edema was found hypoxic in the ED being treated for acute diastolic heart failure and paroxysmal atrial fibrillation  Assessment/Plan:   Acute respiratory failure with hypoxia secondary to acute on chronic diastolic CHF (congestive heart failure) (HCC) She is currently on IV Lasix and a single dose of Zaroxolyn. Had brisk urine output she is negative about 9 L. Potassium is low replete orally. Still going to need his oxygen to keep saturations greater than 92%. Try to wean to room air. Cardiology on board diuretic management per them. The evaluated the patient recommended skilled nursing facility. Continue to monitor electrolytes and creatinine.  Paroxysmal atrial fibrillation: On amiodarone metoprolol and Eliquis. Cardiology recommended DCCV on 06/17/2022. Now back in atrial fibrillation. Further management per cardiology.  Chronic kidney disease stage IIIb: With a baseline creatinine of around 1.1-1.2 has remained at baseline.  Physical deconditioning: PT evaluated the patient, she will need skilled nursing facility.  Hyperlipidemia: Continue Lipitor.  Essential hypertension: Holding amlodipine and hydralazine.  Hypokalemia: Replete orally recheck in the morning.  Right lower extremity cellulitis: Continue oral Doxy.    DVT prophylaxis: Eliquis Family Communication:none Status is: Inpatient Remains inpatient appropriate because: Acute respiratory failure with hypoxia due to acute diastolic heart failure    Code Status:     Code Status Orders  (From  admission, onward)           Start     Ordered   06/11/22 1224  Full code  Continuous       Question:  By:  Answer:  Consent: discussion documented in EHR   06/11/22 1225           Code Status History     Date Active Date Inactive Code Status Order ID Comments User Context   09/14/2020 0945 09/20/2020 0204 Full Code [id number]  Marlow Baars, MD Inpatient   09/26/2018 1310 09/27/2018 1831 Full Code [id number]  Ranee Gosselin, MD Inpatient   08/29/2017 1438 09/11/2017 2152 Full Code [id number]  Gwenyth Bender, NP ED   02/18/2016 1952 02/20/2016 2005 Full Code [id number]  Toni Arthurs, MD Inpatient      Advance Directive Documentation    Flowsheet Row Most Recent Value  Type of Advance Directive Healthcare Power of Attorney  Pre-existing out of facility DNR order (yellow form or pink MOST form) --  "MOST" Form in Place? --         IV Access:   Peripheral IV   Procedures and diagnostic studies:   No results found.   Medical Consultants:   None.   Subjective:    Brenda Matthews had a good night rest, she relates her breathing is significantly better, her swelling is improved.  Objective:    Vitals:   06/20/22 0500 06/20/22 0530 06/20/22 0623 06/20/22 0623  BP:  119/85 119/83 119/83  Pulse:  95 (!) 107 (!) 105  Resp:  (!) 26 20 20   Temp:  97.9 F (36.6 C) 98.1 F (36.7 C) 98.1 F (36.7 C)  TempSrc:  Oral Oral Oral  SpO2:  97% 99% 98%  Weight: 81.4 kg     Height:  SpO2: 98 % O2 Flow Rate (L/min): 3 L/min   Intake/Output Summary (Last 24 hours) at 06/20/2022 0907 Last data filed at 06/20/2022 0600 Gross per 24 hour  Intake 1375 ml  Output 3725 ml  Net -2350 ml    Filed Weights   06/18/22 0500 06/19/22 0500 06/20/22 0500  Weight: 85.1 kg 83.8 kg 81.4 kg    Exam: General exam: In no acute distress. Respiratory system: Good air movement and clear to auscultation. Cardiovascular system: S1 & S2 heard, RRR. No JVD. Gastrointestinal system: Abdomen  is nondistended, soft and nontender.  Extremities: No pedal edema. Skin: No rashes, lesions or ulcers Psychiatry: Judgement and insight appear normal. Mood & affect appropriate. Data Reviewed:    Labs: Basic Metabolic Panel: Recent Labs  Lab 06/14/22 0517 06/15/22 0506 06/16/22 0510 06/17/22 0530 06/18/22 0540 06/20/22 0454  NA 136 141 138 140 139 138  K 3.7 4.0 3.9 4.0 3.9 3.4*  CL 107 108 103 104 102 98  CO2 23 26 25 26 30 31   GLUCOSE 104* 110* 99 107* 99 100*  BUN 30* 36* 41* 35* 37* 33*  CREATININE 1.00 1.07* 1.36* 1.11* 1.24* 1.23*  CALCIUM 8.4* 8.9 8.9 9.2 9.0 9.0  MG 2.1 2.2 2.2 2.2 2.3  --     GFR Estimated Creatinine Clearance: 32.3 mL/min (A) (by C-G formula based on SCr of 1.23 mg/dL (H)). Liver Function Tests: No results for input(s): "AST", "ALT", "ALKPHOS", "BILITOT", "PROT", "ALBUMIN" in the last 168 hours.  No results for input(s): "LIPASE", "AMYLASE" in the last 168 hours. No results for input(s): "AMMONIA" in the last 168 hours. Coagulation profile No results for input(s): "INR", "PROTIME" in the last 168 hours. COVID-19 Labs  No results for input(s): "DDIMER", "FERRITIN", "LDH", "CRP" in the last 72 hours.  Lab Results  Component Value Date   SARSCOV2NAA NEGATIVE 09/19/2020   SARSCOV2NAA NEGATIVE 09/14/2020   SARSCOV2NAA NOT DETECTED 09/22/2018    CBC: No results for input(s): "WBC", "NEUTROABS", "HGB", "HCT", "MCV", "PLT" in the last 168 hours.  Cardiac Enzymes: No results for input(s): "CKTOTAL", "CKMB", "CKMBINDEX", "TROPONINI" in the last 168 hours. BNP (last 3 results) No results for input(s): "PROBNP" in the last 8760 hours. CBG: Recent Labs  Lab 06/20/22 0734  GLUCAP 95   D-Dimer: No results for input(s): "DDIMER" in the last 72 hours. Hgb A1c: No results for input(s): "HGBA1C" in the last 72 hours. Lipid Profile: No results for input(s): "CHOL", "HDL", "LDLCALC", "TRIG", "CHOLHDL", "LDLDIRECT" in the last 72 hours. Thyroid  function studies: No results for input(s): "TSH", "T4TOTAL", "T3FREE", "THYROIDAB" in the last 72 hours.  Invalid input(s): "FREET3" Anemia work up: No results for input(s): "VITAMINB12", "FOLATE", "FERRITIN", "TIBC", "IRON", "RETICCTPCT" in the last 72 hours. Sepsis Labs: No results for input(s): "PROCALCITON", "WBC", "LATICACIDVEN" in the last 168 hours.  Microbiology No results found for this or any previous visit (from the past 240 hour(s)).   Medications:    amiodarone  200 mg Oral BID   apixaban  5 mg Oral BID   atorvastatin  40 mg Oral Daily   furosemide  60 mg Intravenous BID   metoprolol tartrate  50 mg Oral BID   potassium chloride  20 mEq Oral BID   Continuous Infusions:     LOS: 8 days   Marinda Elk  [facility]  06/20/2022, 9:07 AM

## 2022-06-20 NOTE — Progress Notes (Signed)
Cardiology Progress Note  Patient ID: Brenda Matthews MRN: [id number] DOB: 20-Oct-1935 Date of Encounter: 06/20/2022  Primary Cardiologist: Chrystie Nose, MD  Subjective   Chief Complaint: SOB  HPI: Still volume up.  Back in A-fib this morning.  Unaware of her A-fib.  Good diuresis yesterday.  ROS:  All other ROS reviewed and negative. Pertinent positives noted in the HPI.     Inpatient Medications  Scheduled Meds:  amiodarone  200 mg Oral BID   apixaban  5 mg Oral BID   atorvastatin  40 mg Oral Daily   doxycycline  100 mg Oral Q12H   furosemide  80 mg Intravenous BID   metoprolol tartrate  50 mg Oral Q6H   [START ON 06/21/2022] potassium chloride  20 mEq Oral BID   potassium chloride  40 mEq Oral BID   spironolactone  12.5 mg Oral Daily   Continuous Infusions:  PRN Meds: acetaminophen **OR** acetaminophen, albuterol, ALPRAZolam, guaiFENesin-dextromethorphan, hydrALAZINE, Muscle Rub, ondansetron **OR** ondansetron (ZOFRAN) IV, traZODone   Vital Signs   Vitals:   06/20/22 0500 06/20/22 0530 06/20/22 0623 06/20/22 0623  BP:  119/85 119/83 119/83  Pulse:  95 (!) 107 (!) 105  Resp:  (!) 26 20 20   Temp:  97.9 F (36.6 C) 98.1 F (36.7 C) 98.1 F (36.7 C)  TempSrc:  Oral Oral Oral  SpO2:  97% 99% 98%  Weight: 81.4 kg     Height:        Intake/Output Summary (Last 24 hours) at 06/20/2022 1040 Last data filed at 06/20/2022 0935 Gross per 24 hour  Intake 895 ml  Output 3425 ml  Net -2530 ml      06/20/2022    5:00 AM 06/19/2022    5:00 AM 06/18/2022    5:00 AM  Last 3 Weights  Weight (lbs) 179 lb 8 oz 184 lb 11.2 oz 187 lb 9.8 oz  Weight (kg) 81.421 kg 83.779 kg 85.1 kg      Telemetry  Overnight telemetry shows [employer] 110-130 bpm, which I personally reviewed.    Physical Exam   Vitals:   06/20/22 0500 06/20/22 0530 06/20/22 0623 06/20/22 0623  BP:  119/85 119/83 119/83  Pulse:  95 (!) 107 (!) 105  Resp:  (!) 26 20 20   Temp:  97.9 F (36.6 C) 98.1 F (36.7 C) 98.1  F (36.7 C)  TempSrc:  Oral Oral Oral  SpO2:  97% 99% 98%  Weight: 81.4 kg     Height:        Intake/Output Summary (Last 24 hours) at 06/20/2022 1040 Last data filed at 06/20/2022 0935 Gross per 24 hour  Intake 895 ml  Output 3425 ml  Net -2530 ml       06/20/2022    5:00 AM 06/19/2022    5:00 AM 06/18/2022    5:00 AM  Last 3 Weights  Weight (lbs) 179 lb 8 oz 184 lb 11.2 oz 187 lb 9.8 oz  Weight (kg) 81.421 kg 83.779 kg 85.1 kg    Body mass index is 33.92 kg/m.  General: Well nourished, well developed, in no acute distress Head: Atraumatic, normal size  Eyes: PEERLA, EOMI  Neck: Supple, no JVD Endocrine: No thryomegaly Cardiac: Normal S1, S2; irregular rhythm Lungs: Diminished breath sounds bilaterally Abd: Soft, nontender, no hepatomegaly  Ext: 1+ pitting edema Musculoskeletal: No deformities, BUE and BLE strength normal and equal Skin: Warm and dry, no rashes   Neuro: Alert and oriented to person, place, time,  and situation, CNII-XII grossly intact, no focal deficits  Psych: Normal mood and affect   Labs  High Sensitivity Troponin:   Recent Labs  Lab 06/11/22 0916 06/11/22 1115  TROPONINIHS 8 8     Cardiac EnzymesNo results for input(s): "TROPONINI" in the last 168 hours. No results for input(s): "TROPIPOC" in the last 168 hours.  Chemistry Recent Labs  Lab 06/17/22 0530 06/18/22 0540 06/20/22 0454  NA 140 139 138  K 4.0 3.9 3.4*  CL 104 102 98  CO2 26 30 31   GLUCOSE 107* 99 100*  BUN 35* 37* 33*  CREATININE 1.11* 1.24* 1.23*  CALCIUM 9.2 9.0 9.0  GFRNONAA 49* 43* 43*  ANIONGAP 10 7 9     HematologyNo results for input(s): "WBC", "RBC", "HGB", "HCT", "MCV", "MCH", "MCHC", "RDW", "PLT" in the last 168 hours. BNPNo results for input(s): "BNP", "PROBNP" in the last 168 hours.  DDimer No results for input(s): "DDIMER" in the last 168 hours.   Radiology  No results found.  Cardiac Studies  TTE 06/09/2022  1. Left ventricular ejection fraction, by estimation,  is 50 to 55%. The  left ventricle has low normal function. Left ventricular endocardial  border not optimally defined to evaluate regional wall motion. There is  mild concentric left ventricular  hypertrophy. Left ventricular diastolic parameters are indeterminate.   2. Right ventricular systolic function is normal. The right ventricular  size is normal.   3. Left atrial size was moderately dilated.   4. Right atrial size was mildly dilated.   5. The mitral valve is grossly normal. Mild to moderate mitral valve  regurgitation.   6. The aortic valve is tricuspid. There is mild calcification of the  aortic valve. There is mild thickening of the aortic valve. Aortic valve  regurgitation is mild. Aortic valve sclerosis/calcification is present,  without any evidence of aortic  stenosis.   7. Aortic dilatation noted. There is mild dilatation of the ascending  aorta, measuring 41 mm.   Patient Profile  Brenda Matthews is a 87 y.o. female with diastolic heart failure, paroxysmal atrial fibrillation, hypertension, CKD stage IIIb who was admitted on 06/11/2022 for acute hypoxic respiratory failure secondary to diastolic heart failure.  Course also complicated by atrial fibrillation.  Status post cardioversion on 06/17/2022.  Had return of atrial fibrillation on 06/19/2022.  Assessment & Plan   # Acute hypoxic respiratory failure # Acute on chronic diastolic heart failure -Chest x-ray with bibasilar consolidation on admission.  No repeat imaging.  Very diminished breath sounds on examination.  Repeat chest x-ray.  She may need a chest CT. -Could be related to volume overload.  Still volume up.  Net -1 L since admission.  Weight is down roughly 3.4 kg. -Increase Lasix to 80 mg IV twice daily.  Add Aldactone 12.5 mg daily to assist with potassium.  # Persistent atrial fibrillation -Course initially complicated by A-fib.  Status post cardioversion on 06/17/2022.  Has had return of atrial  fibrillation. -Will plan for rate control for now.  Continue amiodarone as she may convert back to normal sinus rhythm.  Continue amiodarone 200 mg twice daily. -Titrate up metoprolol to tartrate to 50 mg every 6 hours.  May need low-dose calcium channel blocker as well.  # CKD IIIb -Stable kidney function     For questions or updates, please contact [facility] Please consult [email or web address] for contact info under        Signed, Gerri Spore T. Flora Lipps, MD, [facility]  [facility]  06/20/2022 10:40 AM

## 2022-06-20 NOTE — Care Management Important Message (Signed)
Important Message  Patient Details IM Letter given. Name: Brenda Matthews MRN: [id number] Date of Birth: [birth year]   Medicare Important Message Given:  Yes     Caren Macadam 06/20/2022, 12:50 PM

## 2022-06-20 NOTE — Progress Notes (Signed)
Physical Therapy Treatment Patient Details Name: Brenda Matthews MRN: [id number] DOB: 1935-05-03 Today's Date: 06/20/2022   History of Present Illness Brenda Matthews is an 87 yo female with PMH chronic dCHF, CKD3b, HTN, HLD who presented 06/11/22  with worsening shortness of breath and lower extremity swelling.    PT Comments    Pt agreeable to therapy. O2 95% on RA at rest, 92% on RA with ambulation. HR 127 bpm, dyspnea 2/4. Pt fatigues fairly easily. Will continue to follow and progress activity as tolerated.    Recommendations for follow up therapy are one component of a multi-disciplinary discharge planning process, led by the attending physician.  Recommendations may be updated based on patient status, additional functional criteria and insurance authorization.  Follow Up Recommendations  Can patient physically be transported by private vehicle: Yes    Assistance Recommended at Discharge Intermittent Supervision/Assistance  Patient can return home with the following A little help with bathing/dressing/bathroom;Assist for transportation;Help with stairs or ramp for entrance   Equipment Recommendations  None recommended by PT    Recommendations for Other Services       Precautions / Restrictions Precautions Precautions: Fall Precaution Comments: monitor sats Restrictions Weight Bearing Restrictions: No     Mobility  Bed Mobility               General bed mobility comments: sitting in recliner    Transfers Overall transfer level: Needs assistance Equipment used: Rolling walker (2 wheels) Transfers: Sit to/from Stand Sit to Stand: Supervision                Ambulation/Gait Ambulation/Gait assistance: Min guard Gait Distance (Feet): 75 Feet Assistive device: Rolling walker (2 wheels) Gait Pattern/deviations: Step-through pattern, Decreased stride length       General Gait Details: Min guard A for safety. RW used for ambulation safety. O2 92% on RA, HR 127  bpm   Stairs             Wheelchair Mobility    Modified Rankin (Stroke Patients Only)       Balance Overall balance assessment: History of Falls, Needs assistance         Standing balance support: During functional activity, Reliant on assistive device for balance, Bilateral upper extremity supported Standing balance-Leahy Scale: Fair                              Cognition Arousal/Alertness: Awake/alert Behavior During Therapy: WFL for tasks assessed/performed Overall Cognitive Status: Within Functional Limits for tasks assessed                                          Exercises      General Comments        Pertinent Vitals/Pain Pain Assessment Pain Assessment: No/denies pain    Home Living                          Prior Function            PT Goals (current goals can now be found in the care plan section) Progress towards PT goals: Progressing toward goals    Frequency    Min 1X/week      PT Plan Current plan remains appropriate    Co-evaluation  AM-PAC PT "6 Clicks" Mobility   Outcome Measure  Help needed turning from your back to your side while in a flat bed without using bedrails?: None Help needed moving from lying on your back to sitting on the side of a flat bed without using bedrails?: None Help needed moving to and from a bed to a chair (including a wheelchair)?: None Help needed standing up from a chair using your arms (e.g., wheelchair or bedside chair)?: A Little Help needed to walk in hospital room?: A Little Help needed climbing 3-5 steps with a railing? : A Little 6 Click Score: 21    End of Session   Activity Tolerance: Patient tolerated treatment well;Patient limited by fatigue Patient left: in chair;with call bell/phone within reach   PT Visit Diagnosis: Unsteadiness on feet (R26.81);History of falling (Z91.81);Difficulty in walking, not elsewhere classified  (R26.2)     Time: 1610-9604 PT Time Calculation (min) (ACUTE ONLY): 13 min  Charges:  $Gait Training: 8-22 mins                         Faye Ramsay, PT Acute Rehabilitation  Office: [phone]

## 2022-06-20 NOTE — Anesthesia Postprocedure Evaluation (Signed)
Anesthesia Post Note  Patient: Brenda Matthews  Procedure(s) Performed: CARDIOVERSION     Patient location during evaluation: PACU Anesthesia Type: General Level of consciousness: awake and alert Pain management: pain level controlled Vital Signs Assessment: post-procedure vital signs reviewed and stable Respiratory status: spontaneous breathing, nonlabored ventilation, respiratory function stable and patient connected to nasal cannula oxygen Cardiovascular status: blood pressure returned to baseline and stable Postop Assessment: no apparent nausea or vomiting Anesthetic complications: no  No notable events documented.  Last Vitals:  Vitals:   06/20/22 0623 06/20/22 0623  BP: 119/83 119/83  Pulse: (!) 107 (!) 105  Resp: 20 20  Temp: 36.7 C 36.7 C  SpO2: 99% 98%    Last Pain:  Vitals:   06/20/22 0623  TempSrc: Oral  PainSc:                  Arnelle Nale L Quantasia Stegner

## 2022-06-20 NOTE — Progress Notes (Signed)
Mobility Specialist - Progress Note   06/20/22 1319  Mobility  Activity Transferred to/from [facility]  Level of Assistance Modified independent, requires aide device or extra time  Distance Ambulated (ft) 2 ft  Activity Response Tolerated well  Mobility Referral Yes  $Mobility charge 1 Mobility  Mobility Specialist Start Time (ACUTE ONLY) 0102  Mobility Specialist Stop Time (ACUTE ONLY) 0116  Mobility Specialist Time Calculation (min) (ACUTE ONLY) 14 min   Pt received in recliner requesting assistance to [facility]. Pt declined ambulation due to fatigue. No other complaints during session. Pt to recliner after session with all needs met.    Pre-mobility: 106 HR Post-mobility: 118 HR  [facility] Paediatric nurse

## 2022-06-21 DIAGNOSIS — N1832 Chronic kidney disease, stage 3b: Secondary | ICD-10-CM | POA: Diagnosis not present

## 2022-06-21 DIAGNOSIS — I480 Paroxysmal atrial fibrillation: Secondary | ICD-10-CM | POA: Diagnosis not present

## 2022-06-21 DIAGNOSIS — R0602 Shortness of breath: Secondary | ICD-10-CM | POA: Diagnosis not present

## 2022-06-21 DIAGNOSIS — I5033 Acute on chronic diastolic (congestive) heart failure: Secondary | ICD-10-CM | POA: Diagnosis not present

## 2022-06-21 DIAGNOSIS — I509 Heart failure, unspecified: Secondary | ICD-10-CM | POA: Diagnosis not present

## 2022-06-21 LAB — BASIC METABOLIC PANEL
Anion gap: 11 (ref 5–15)
BUN: 43 mg/dL — ABNORMAL HIGH (ref 8–23)
CO2: 33 mmol/L — ABNORMAL HIGH (ref 22–32)
Calcium: 9.5 mg/dL (ref 8.9–10.3)
Chloride: 98 mmol/L (ref 98–111)
Creatinine, Ser: 1.58 mg/dL — ABNORMAL HIGH (ref 0.44–1.00)
GFR, Estimated: 32 mL/min — ABNORMAL LOW (ref >60)
Glucose, Bld: 116 mg/dL — ABNORMAL HIGH (ref 70–99)
Potassium: 4.3 mmol/L (ref 3.5–5.1)
Sodium: 142 mmol/L (ref 135–145)

## 2022-06-21 MED ORDER — ORAL CARE MOUTH RINSE: 15.0000 mL | OROMUCOSAL | Status: DC | PRN

## 2022-06-21 MED ORDER — FUROSEMIDE 10 MG/ML IJ SOLN
80.0000 mg | Freq: Once | INTRAMUSCULAR | Status: AC
  Administered 2022-06-21: 80 mg via INTRAVENOUS
  Filled 2022-06-21: qty 8

## 2022-06-21 MED ORDER — ACETAZOLAMIDE 250 MG PO TABS
250.0000 mg | ORAL_TABLET | Freq: Once | ORAL | Status: AC
  Administered 2022-06-21: 250 mg via ORAL
  Filled 2022-06-21: qty 1

## 2022-06-21 MED ORDER — DILTIAZEM HCL 30 MG PO TABS
30.0000 mg | ORAL_TABLET | Freq: Three times a day (TID) | ORAL | Status: DC
  Administered 2022-06-21 – 2022-06-22 (×3): 30 mg via ORAL
  Filled 2022-06-21 (×3): qty 1

## 2022-06-21 NOTE — Progress Notes (Signed)
Occupational Therapy Treatment Patient Details Name: Brenda Matthews MRN: [id number] DOB: 23-Nov-1935 Today's Date: 06/21/2022   History of present illness Brenda Matthews is an 87 yo female with PMH chronic dCHF, CKD3b, HTN, HLD who presented 06/11/22  with worsening shortness of breath and lower extremity swelling.   OT comments  Patient is making progress towards goals. Patient was noted to drop to 88% on RA during tasks with increased SOB. Patient was educated on getting more independent in tasks with patient participating in bathing and dressing tasks with staff supervision and A as needed v.s. having staff complete tasks patient can do. Patient verbalized understanding. Patient was educated on increasing activity tolerance with transitioning to bathroom v.s. using BSC. Patient verbalized understanding. Patient's discharge plan remains appropriate at this time. OT will continue to follow acutely.     Recommendations for follow up therapy are one component of a multi-disciplinary discharge planning process, led by the attending physician.  Recommendations may be updated based on patient status, additional functional criteria and insurance authorization.    Assistance Recommended at Discharge Frequent or constant Supervision/Assistance  Patient can return home with the following  A little help with bathing/dressing/bathroom;A little help with walking and/or transfers;Assist for transportation;Help with stairs or ramp for entrance   Equipment Recommendations  None recommended by OT       Precautions / Restrictions Precautions Precautions: Fall Precaution Comments: monitor sats Restrictions Weight Bearing Restrictions: No       Mobility Bed Mobility Overal bed mobility: Modified Independent                            Balance Overall balance assessment: History of Falls, Needs assistance Sitting-balance support: Feet supported, No upper extremity supported Sitting balance-Leahy  Scale: Good     Standing balance support: During functional activity, Single extremity supported Standing balance-Leahy Scale: Fair         ADL either performed or assessed with clinical judgement   ADL Overall ADL's : Needs assistance/impaired     Grooming: Supervision/safety;Standing;Brushing hair Grooming Details (indicate cue type and reason): at sink in bathroom with no AD.               Lower Body Dressing Details (indicate cue type and reason): patient was able to don/doff socks sitting EOB with grey socks noted to cut into patient's edema (no open places) supplied with blue ones. Toilet Transfer: Supervision/safety;Ambulation Toilet Transfer Details (indicate cue type and reason): to bathroom with increased time. patient and nurse were educated on importance of going to the bathroom during the day especially v.s. using BSC. nurse and patient verbalized understanding. 2L/min with O2 maintaining in 90s. patient to transfer to and from [facility] with no O2 patients O2 was 88% on RA.                  Cognition Arousal/Alertness: Awake/alert Behavior During Therapy: WFL for tasks assessed/performed Overall Cognitive Status: Within Functional Limits for tasks assessed                                                     Pertinent Vitals/ Pain       Pain Assessment Pain Assessment: No/denies pain         Frequency  Min 2X/week  Progress Toward Goals  OT Goals(current goals can now be found in the care plan section)  Progress towards OT goals: Progressing toward goals     Plan Discharge plan remains appropriate       AM-PAC OT "6 Clicks" Daily Activity     Outcome Measure   Help from another person eating meals?: None Help from another person taking care of personal grooming?: A Little Help from another person toileting, which includes using toliet, bedpan, or urinal?: A Little Help from another person bathing (including  washing, rinsing, drying)?: A Little Help from another person to put on and taking off regular upper body clothing?: None Help from another person to put on and taking off regular lower body clothing?: A Little 6 Click Score: 20    End of Session Equipment Utilized During Treatment: Gait belt  OT Visit Diagnosis: Unsteadiness on feet (R26.81);Other (comment);History of falling (Z91.81);Muscle weakness (generalized) (M62.81)   Activity Tolerance Patient limited by fatigue   Patient Left with call bell/phone within reach;in chair;with chair alarm set;with nursing/sitter in room   Nurse Communication Mobility status        Time: 1610-9604 OT Time Calculation (min): 34 min  Charges: OT General Charges $OT Visit: 1 Visit OT Treatments $Self Care/Home Management : 23-37 mins  Brenda Loud, MS Acute Rehabilitation Department Office# [phone]   Brenda Matthews 06/21/2022, 11:20 AM

## 2022-06-21 NOTE — Plan of Care (Signed)
  Problem: Education: Goal: Knowledge of General Education information will improve Description: Including pain rating scale, medication(s)/side effects and non-pharmacologic comfort measures Outcome: Completed/Met   Problem: Health Behavior/Discharge Planning: Goal: Ability to manage health-related needs will improve Outcome: Progressing   Problem: Clinical Measurements: Goal: Ability to maintain clinical measurements within normal limits will improve Outcome: Progressing Goal: Will remain free from infection Outcome: Progressing Goal: Diagnostic test results will improve Outcome: Progressing Goal: Respiratory complications will improve Outcome: Progressing Goal: Cardiovascular complication will be avoided Outcome: Progressing   Problem: Activity: Goal: Risk for activity intolerance will decrease Outcome: Progressing   Problem: Nutrition: Goal: Adequate nutrition will be maintained Outcome: Adequate for Discharge   Problem: Coping: Goal: Level of anxiety will decrease Outcome: Adequate for Discharge   Problem: Elimination: Goal: Will not experience complications related to bowel motility Outcome: Adequate for Discharge Goal: Will not experience complications related to urinary retention Outcome: Adequate for Discharge   Problem: Pain Managment: Goal: General experience of comfort will improve Outcome: Adequate for Discharge   Problem: Safety: Goal: Ability to remain free from injury will improve Outcome: Adequate for Discharge   Problem: Skin Integrity: Goal: Risk for impaired skin integrity will decrease Outcome: Adequate for Discharge

## 2022-06-21 NOTE — Progress Notes (Signed)
Rounding Note    Patient Name: Brenda Matthews Date of Encounter: 06/21/2022  [facility] Cardiologist: Chrystie Nose, MD   Subjective   Patient reports feeling better this AM. Was able to be on room air yesterday afternoon, but needed supplemental oxygen overnight. Became very sob on room air after walking to the bathroom. Denies chest pain, palpitations. Continues to have ankle edema, though it is improving   Inpatient Medications    Scheduled Meds:  amiodarone  200 mg Oral BID   apixaban  5 mg Oral BID   atorvastatin  40 mg Oral Daily   doxycycline  100 mg Oral Q12H   furosemide  80 mg Intravenous BID   metoprolol tartrate  50 mg Oral Q6H   potassium chloride  20 mEq Oral BID   spironolactone  12.5 mg Oral Daily   Continuous Infusions:  PRN Meds: acetaminophen **OR** acetaminophen, albuterol, ALPRAZolam, guaiFENesin-dextromethorphan, hydrALAZINE, Muscle Rub, ondansetron **OR** ondansetron (ZOFRAN) IV, mouth rinse, traZODone   Vital Signs    Vitals:   06/20/22 2153 06/21/22 0245 06/21/22 0443 06/21/22 0500  BP: 131/83 135/86 106/77   Pulse:  (!) 103 70   Resp: 18  18   Temp: 97.8 F (36.6 C)  97.6 F (36.4 C)   TempSrc: Oral  Oral   SpO2: 98%  94%   Weight:    81.3 kg  Height:        Intake/Output Summary (Last 24 hours) at 06/21/2022 0847 Last data filed at 06/21/2022 0400 Gross per 24 hour  Intake 360 ml  Output 2450 ml  Net -2090 ml      06/21/2022    5:00 AM 06/20/2022    5:00 AM 06/19/2022    5:00 AM  Last 3 Weights  Weight (lbs) 179 lb 2 oz 179 lb 8 oz 184 lb 11.2 oz  Weight (kg) 81.25 kg 81.421 kg 83.779 kg      Telemetry    Atrial fibrillation, HR predominantly in the 90s-100s. Occasionally increases to the 120s when she walks - Personally Reviewed  ECG     No new tracings since 5/31 - Personally Reviewed  Physical Exam   GEN: No acute distress.  Sitting on the side of the bed working with OT  Neck: No JVD Cardiac: Irregular rate  and rhythm. Radial pulses 2+ bilaterally  Respiratory: Diminished breath sounds in bilateral lung bases. Normal work of breathing on [place]  GI: Soft, nontender MS: 1+ edema in BLE  Neuro:  Nonfocal. Alert and oriented to person, place, time, and situation  Psych: Normal affect and normal mood   Labs    High Sensitivity Troponin:   Recent Labs  Lab 06/11/22 0916 06/11/22 1115  TROPONINIHS 8 8     Chemistry Recent Labs  Lab 06/16/22 0510 06/17/22 0530 06/18/22 0540 06/20/22 0454 06/21/22 0457  NA 138 140 139 138 142  K 3.9 4.0 3.9 3.4* 4.3  CL 103 104 102 98 98  CO2 25 26 30 31  33*  GLUCOSE 99 107* 99 100* 116*  BUN 41* 35* 37* 33* 43*  CREATININE 1.36* 1.11* 1.24* 1.23* 1.58*  CALCIUM 8.9 9.2 9.0 9.0 9.5  MG 2.2 2.2 2.3  --   --   GFRNONAA 38* 49* 43* 43* 32*  ANIONGAP 10 10 7 9 11     Lipids No results for input(s): "CHOL", "TRIG", "HDL", "LABVLDL", "LDLCALC", "CHOLHDL" in the last 168 hours.  HematologyNo results for input(s): "WBC", "RBC", "HGB", "HCT", "MCV", "  MCH", "MCHC", "RDW", "PLT" in the last 168 hours. Thyroid No results for input(s): "TSH", "FREET4" in the last 168 hours.  BNPNo results for input(s): "BNP", "PROBNP" in the last 168 hours.  DDimer No results for input(s): "DDIMER" in the last 168 hours.   Radiology    DG CHEST PORT 1 VIEW  Result Date: 06/20/2022 CLINICAL DATA:  Shortness of breath. EXAM: PORTABLE CHEST 1 VIEW COMPARISON:  06/11/2022. FINDINGS: Small right pleural effusion with adjacent right basilar atelectasis. Improved aeration of the left lung base. No pneumothorax. No consolidation or pulmonary edema. Stable cardiac and mediastinal contours. IMPRESSION: Small right pleural effusion with adjacent right basilar atelectasis. Improved aeration of the left lung base. Electronically Signed   By: Orvan Falconer M.D.   On: 06/20/2022 11:39    Cardiac Studies   Echocardiogram 06/09/22  1. Left ventricular ejection fraction, by estimation, is 50 to  55%. The  left ventricle has low normal function. Left ventricular endocardial  border not optimally defined to evaluate regional wall motion. There is  mild concentric left ventricular  hypertrophy. Left ventricular diastolic parameters are indeterminate.   2. Right ventricular systolic function is normal. The right ventricular  size is normal.   3. Left atrial size was moderately dilated.   4. Right atrial size was mildly dilated.   5. The mitral valve is grossly normal. Mild to moderate mitral valve  regurgitation.   6. The aortic valve is tricuspid. There is mild calcification of the  aortic valve. There is mild thickening of the aortic valve. Aortic valve  regurgitation is mild. Aortic valve sclerosis/calcification is present,  without any evidence of aortic  stenosis.   7. Aortic dilatation noted. There is mild dilatation of the ascending  aorta, measuring 41 mm.   Comparison(s): No significant change from prior study.    Patient Profile     87 y.o. female with diastolic heart failure, paroxysmal atrial fibrillation, hypertension, CKD stage IIIb who was admitted on 06/11/2022 for acute hypoxic respiratory failure secondary to diastolic heart failure.  Course also complicated by atrial fibrillation.  Status post cardioversion on 06/17/2022.  Had return of atrial fibrillation on 06/19/2022.   Assessment & Plan   Acute hypoxic Respiratory Failure  Acute on chronic diastolic heart failure - CXR on admission (5/25) showed bibasilar collapse/consolidation with probable small left effusion. BNP elevated to 1132 - Repeat CXR yesterday showed small right pleural effusion with adjacent right basilar atelectasis and improved aeration of the left lung base  - Patient now on IV lasix 80 mg BID and spironolactone 12.5 mg daily - output 2.45 L urine yesterday, net -11 L since admission. Creatinine bumped from 1.23>1.58 today  - She continues to have ankle edema, diminished breath sounds, and  requires supplemental oxygen. Continue IV lasix 80 mg BID for now, but I suspect she can be transitioned to PO lasix tomorrow   Persistent atrial fibrillation   - Patient underwent cardioversion on 5/31. Had return of atrial fibrillation on 6/2 - Per telemetry, patient remains in atrial fibrillation. HR is overall well controlled in the 90s-low 100s, but does increase when patient walks  - Continue amiodarone 200 mg BID - Continue metoprolol tartrate 50 mg q 6hrs- 106/77 this AM, no room to titrate for now  - Continue eliquis 5 mg BID for now- follow renal function and if creatinine remains >1.5, will need to dose adjust   CKD stage IIIb - Creatinine slightly up from 1.23>1.58 today  - Daily BMPs  with diuresis   For questions or updates, please contact [facility] Please consult [email or web address] for contact info under        Signed, Jonita Albee, PA-C  06/21/2022, 8:47 AM

## 2022-06-21 NOTE — Progress Notes (Signed)
[facility] PROGRESS NOTE    Progress Note  Brenda Matthews  ZOX:[id number] DOB: 05/04/1935 DOA: 06/11/2022 PCP: Jeoffrey Massed, MD     Brief Narrative:   Brenda Matthews is an 87 y.o. female past medical history of chronic diastolic heart failure, chronic kidney disease stage IIIb, paroxysmal atrial fibrillation essential hypertension presented with worsening shortness of breath and lower extremity edema was found hypoxic in the ED being treated for acute diastolic heart failure and paroxysmal atrial fibrillation  Assessment/Plan:   Acute respiratory failure with hypoxia secondary to acute on chronic diastolic CHF (congestive heart failure) (HCC) She is currently on IV Lasix and Aldactone. Her Lasix was increased she is negative about 11 L. X-rays show small effusion improve aeration. She is documented as being on 2 L, when I went in the room she was moving around on room air did not feel short of breath. Try to wean to room air. The evaluated the patient recommended skilled nursing facility. Still appears fluid overloaded.  Paroxysmal atrial fibrillation: On amiodarone metoprolol and Eliquis. Cardiology recommended DCCV on 06/17/2022. Now back in atrial fibrillation. Further management per cardiology.  Chronic kidney disease stage IIIb: With a baseline creatinine of around 1.1-1.2 has remained at baseline.  Physical deconditioning: PT evaluated the patient, she will need skilled nursing facility.  Hyperlipidemia: Continue Lipitor.  Essential hypertension: Holding amlodipine and hydralazine.  Hypokalemia: Replete orally recheck in the morning.  Right lower extremity cellulitis: Continue oral Doxy.    DVT prophylaxis: Eliquis Family Communication:none Status is: Inpatient Remains inpatient appropriate because: Acute respiratory failure with hypoxia due to acute diastolic heart failure    Code Status:     Code Status Orders  (From admission, onward)            Start     Ordered   06/11/22 1224  Full code  Continuous       Question:  By:  Answer:  Consent: discussion documented in EHR   06/11/22 1225           Code Status History     Date Active Date Inactive Code Status Order ID Comments User Context   09/14/2020 0945 09/20/2020 0204 Full Code [id number]  Marlow Baars, MD Inpatient   09/26/2018 1310 09/27/2018 1831 Full Code [id number]  Ranee Gosselin, MD Inpatient   08/29/2017 1438 09/11/2017 2152 Full Code [id number]  Gwenyth Bender, NP ED   02/18/2016 1952 02/20/2016 2005 Full Code [id number]  Toni Arthurs, MD Inpatient      Advance Directive Documentation    Flowsheet Row Most Recent Value  Type of Advance Directive Healthcare Power of Attorney  Pre-existing out of facility DNR order (yellow form or pink MOST form) --  "MOST" Form in Place? --         IV Access:   Peripheral IV   Procedures and diagnostic studies:   DG CHEST PORT 1 VIEW  Result Date: 06/20/2022 CLINICAL DATA:  Shortness of breath. EXAM: PORTABLE CHEST 1 VIEW COMPARISON:  06/11/2022. FINDINGS: Small right pleural effusion with adjacent right basilar atelectasis. Improved aeration of the left lung base. No pneumothorax. No consolidation or pulmonary edema. Stable cardiac and mediastinal contours. IMPRESSION: Small right pleural effusion with adjacent right basilar atelectasis. Improved aeration of the left lung base. Electronically Signed   By: Orvan Falconer M.D.   On: 06/20/2022 11:39     Medical Consultants:   None.   Subjective:    [name] good rest no complaints  of the oxygen  Objective:    Vitals:   06/20/22 2153 06/21/22 0245 06/21/22 0443 06/21/22 0500  BP: 131/83 135/86 106/77   Pulse:  (!) 103 70   Resp: 18  18   Temp: 97.8 F (36.6 C)  97.6 F (36.4 C)   TempSrc: Oral  Oral   SpO2: 98%  94%   Weight:    81.3 kg  Height:       SpO2: 94 % O2 Flow Rate (L/min): 2 L/min   Intake/Output Summary (Last 24 hours) at  06/21/2022 0838 Last data filed at 06/21/2022 0400 Gross per 24 hour  Intake 360 ml  Output 2450 ml  Net -2090 ml    Filed Weights   06/19/22 0500 06/20/22 0500 06/21/22 0500  Weight: 83.8 kg 81.4 kg 81.3 kg    Exam: General exam: In no acute distress. Respiratory system: Good air movement and clear to auscultation. Cardiovascular system: S1 & S2 heard, RRR.  Positive JVD Gastrointestinal system: Abdomen is nondistended, soft and nontender.  Extremities: 3+ edema Skin: No rashes, lesions or ulcers Psychiatry: Judgement and insight appear normal. Mood & affect appropriate. Data Reviewed:    Labs: Basic Metabolic Panel: Recent Labs  Lab 06/15/22 0506 06/16/22 0510 06/17/22 0530 06/18/22 0540 06/20/22 0454 06/21/22 0457  NA 141 138 140 139 138 142  K 4.0 3.9 4.0 3.9 3.4* 4.3  CL 108 103 104 102 98 98  CO2 26 25 26 30 31  33*  GLUCOSE 110* 99 107* 99 100* 116*  BUN 36* 41* 35* 37* 33* 43*  CREATININE 1.07* 1.36* 1.11* 1.24* 1.23* 1.58*  CALCIUM 8.9 8.9 9.2 9.0 9.0 9.5  MG 2.2 2.2 2.2 2.3  --   --     GFR Estimated Creatinine Clearance: 25.2 mL/min (A) (by C-G formula based on SCr of 1.58 mg/dL (H)). Liver Function Tests: No results for input(s): "AST", "ALT", "ALKPHOS", "BILITOT", "PROT", "ALBUMIN" in the last 168 hours.  No results for input(s): "LIPASE", "AMYLASE" in the last 168 hours. No results for input(s): "AMMONIA" in the last 168 hours. Coagulation profile No results for input(s): "INR", "PROTIME" in the last 168 hours. COVID-19 Labs  No results for input(s): "DDIMER", "FERRITIN", "LDH", "CRP" in the last 72 hours.  Lab Results  Component Value Date   SARSCOV2NAA NEGATIVE 09/19/2020   SARSCOV2NAA NEGATIVE 09/14/2020   SARSCOV2NAA NOT DETECTED 09/22/2018    CBC: No results for input(s): "WBC", "NEUTROABS", "HGB", "HCT", "MCV", "PLT" in the last 168 hours.  Cardiac Enzymes: No results for input(s): "CKTOTAL", "CKMB", "CKMBINDEX", "TROPONINI" in the  last 168 hours. BNP (last 3 results) No results for input(s): "PROBNP" in the last 8760 hours. CBG: Recent Labs  Lab 06/20/22 0734 06/20/22 1117 06/20/22 1635  GLUCAP 95 126* 88    D-Dimer: No results for input(s): "DDIMER" in the last 72 hours. Hgb A1c: No results for input(s): "HGBA1C" in the last 72 hours. Lipid Profile: No results for input(s): "CHOL", "HDL", "LDLCALC", "TRIG", "CHOLHDL", "LDLDIRECT" in the last 72 hours. Thyroid function studies: No results for input(s): "TSH", "T4TOTAL", "T3FREE", "THYROIDAB" in the last 72 hours.  Invalid input(s): "FREET3" Anemia work up: No results for input(s): "VITAMINB12", "FOLATE", "FERRITIN", "TIBC", "IRON", "RETICCTPCT" in the last 72 hours. Sepsis Labs: No results for input(s): "PROCALCITON", "WBC", "LATICACIDVEN" in the last 168 hours.  Microbiology No results found for this or any previous visit (from the past 240 hour(s)).   Medications:    amiodarone  200 mg Oral BID  apixaban  5 mg Oral BID   atorvastatin  40 mg Oral Daily   doxycycline  100 mg Oral Q12H   furosemide  80 mg Intravenous BID   metoprolol tartrate  50 mg Oral Q6H   potassium chloride  20 mEq Oral BID   spironolactone  12.5 mg Oral Daily   Continuous Infusions:     LOS: 9 days   Marinda Elk  [facility]  06/21/2022, 8:38 AM

## 2022-06-21 NOTE — TOC Progression Note (Signed)
Transition of Care [facility]) - Progression Note    Patient Details  Name: Brenda Matthews MRN: [id number] Date of Birth: Feb 27, 1935  [facility] CM/SW Contact  Ysabelle Goodroe, Olegario Messier, RN Phone Number: 06/21/2022, 3:12 PM  Clinical Narrative: [facility] [place] VO#[phone] [facility]kley Harvey for [facility].      Expected Discharge Plan: Skilled Nursing Facility Barriers to Discharge: Insurance Authorization  Expected Discharge Plan and Services   Discharge Planning Services: CM Consult Post Acute Care Choice: Skilled Nursing Facility Living arrangements for the past 2 months: Independent Living Facility                                       Social Determinants of Health (SDOH) Interventions SDOH Screenings   Food Insecurity: No Food Insecurity (06/11/2022)  Housing: Patient Declined (06/11/2022)  Transportation Needs: No Transportation Needs (06/11/2022)  Utilities: Not At Risk (06/11/2022)  Alcohol Screen: Low Risk  (10/11/2020)  Depression (PHQ2-9): Low Risk  (10/27/2021)  Financial Resource Strain: Low Risk  (10/27/2021)  Physical Activity: Sufficiently Active (10/27/2021)  Social Connections: Moderately Integrated (10/27/2021)  Stress: No Stress Concern Present (10/27/2021)  Tobacco Use: Low Risk  (06/17/2022)    Readmission Risk Interventions     No data to display

## 2022-06-22 DIAGNOSIS — I480 Paroxysmal atrial fibrillation: Secondary | ICD-10-CM | POA: Diagnosis not present

## 2022-06-22 DIAGNOSIS — I5033 Acute on chronic diastolic (congestive) heart failure: Secondary | ICD-10-CM | POA: Diagnosis not present

## 2022-06-22 LAB — BASIC METABOLIC PANEL
Anion gap: 12 (ref 5–15)
BUN: 48 mg/dL — ABNORMAL HIGH (ref 8–23)
CO2: 32 mmol/L (ref 22–32)
Calcium: 9.4 mg/dL (ref 8.9–10.3)
Chloride: 97 mmol/L — ABNORMAL LOW (ref 98–111)
Creatinine, Ser: 1.6 mg/dL — ABNORMAL HIGH (ref 0.44–1.00)
GFR, Estimated: 31 mL/min — ABNORMAL LOW (ref >60)
Glucose, Bld: 106 mg/dL — ABNORMAL HIGH (ref 70–99)
Potassium: 3.6 mmol/L (ref 3.5–5.1)
Sodium: 141 mmol/L (ref 135–145)

## 2022-06-22 NOTE — Progress Notes (Signed)
Mobility Specialist - Progress Note   06/22/22 1032  Oxygen Therapy  SpO2 95 %  O2 Device Nasal Cannula  O2 Flow Rate (L/min) 2 L/min  Patient Activity (if Appropriate) Ambulating  Mobility  Activity Ambulated with assistance in hallway  Level of Assistance Standby assist, set-up cues, supervision of patient - no hands on  Assistive Device Front wheel walker  Distance Ambulated (ft) 250 ft  Activity Response Tolerated well  Mobility Referral Yes  $Mobility charge 1 Mobility  Mobility Specialist Start Time (ACUTE ONLY) 1018  Mobility Specialist Stop Time (ACUTE ONLY) 1030  Mobility Specialist Time Calculation (min) (ACUTE ONLY) 12 min   Pt received EOB and agreeable to mobility. No complaints during session. Pt states she feels better today. Pt to bed after session with all needs met. Bed alarm on.   Pre-mobility: 73 HR, 93% SpO2 (2L [place]) During mobility: 86 HR, 95% SpO2 (2L [place]) Post-mobility: 73 HR, 88%  SPO2 (2L [place])  Chief Technology Officer

## 2022-06-22 NOTE — Progress Notes (Signed)
PROGRESS NOTE    [name]  ZOX:[id number] DOB: 1935-07-25 DOA: 06/11/2022 PCP: Jeoffrey Massed, MD    Brief Narrative:   Brenda Matthews is a 87 y.o. female with past medical history significant for chronic diastolic congestive heart failure, CKD stage IIIb, paroxysmal atrial fibrillation, HTN, HLD who presented to [facility] ED on 5/25 with progressive shortness of breath, lower extremity edema.  Onset over the past 2 weeks.  Recently seen by cardiology outpatient in which her dose of Lasix was increased for 3 days with improvement of her symptoms initially.  Also endorses orthopnea.  Now her swelling in her lower extremities has worsened.  Given her symptoms, EMS was called and patient was transferred to the ED for further evaluation management.  In the ED, temperature 97.6 F, HR 96, RR 21, BP 124/89, SpO2 97% on room air.  WBC 8.1, hemoglobin 11.9, platelets 29.  Sodium 141, potassium 3.9, chloride 110, CO2 21, glucose 120, BUN 27, creatinine 0.94.  BNP 1132.  High sensitive troponin 8 followed by 8.  Chest x-ray with bibasilar collapse/consolidation, left greater than right with probable small left pleural effusion.  Patient was given IV Lasix.  TRH consulted for admission for further evaluation management of acute on chronic diastolic congestive heart failure exacerbation.  Assessment & Plan:   Acute on chronic diastolic congestive heart failure exacerbation Acute hypoxic respiratory failure, not POA: Resolved Patient presenting to ED with progressive shortness of breath and lower extremity edema.  Patient is afebrile without leukocytosis.  BNP elevated 1132.  Chest x-ray with findings consistent with pulmonary edema and small pleural effusion.  TTE on 06/09/2022 with LVEF 50-55%, biatrial enlargement, moderate MR, mild AR, no aortic stenosis, aortic dilation measuring 41 mm. --Cardiology following, appreciate assistance -- net negative 1.04L past 24h and net negative 12L since  admission -- Wt 84.8>>85.1>>81.3>81.3kg -- Holding further diuresis for now given elevated creatinine -- Oxygen now titrated off -- Strict I's and O's, daily weights  Atrial fibrillation with RVR During hospitalization patient went A-fib with RVR.  Underwent DCCV on 06/17/2022, unfortunately did not remain in sinus rhythm. -- Cardiology following as above -- Amiodarone 200 mg p.o. twice daily -- Metoprolol tartrate 50 mg p.o. every 6 hours -- Eliquis 5 mg p.o. twice daily -- Continue monitor on telemetry  Acute on chronic CKD stage IIIb Baseline creatinine 1.1-1.2.  Creatinine increased to 1.60 likely secondary to aggressive IV diuresis. --Holding further diuresis per cardiology for now --Cr 0.94>>>1.60 --BMP in a.m.  Hyperlipidemia -- Atorvastatin 40 p.o. daily  Essential hypertension Home regimen includes amlodipine 10 mg p.o. daily, Lasix 40 mg p.o. daily, metoprolol tartrate 100 mg p.o. twice daily, -- Continue metoprolol tartrate 50 g p.o. every 6 hours -- Holding home amlodipine, furosemide for now  Right lower extremity cellulitis --Doxycycline 100 mg p.o. twice daily  Weakness/debility/deconditioning/gait disturbance: Patient currently resides at [facility] independent living.  Seen by PT and OT with recommendation of SNF placement. --TOC following, awaiting insurance authorization   DVT prophylaxis: Place TED hose Start: 06/19/22 0847 Place TED hose Start: 06/18/22 0831 SCDs Start: 06/11/22 1224 apixaban (ELIQUIS) tablet 5 mg    Code Status: Full Code Family Communication: No family present at bedside this morning  Disposition Plan:  Level of care: Telemetry Status is: Inpatient Remains inpatient appropriate because: Awaiting SNF placement/insurance authorization and cardiology sign off    Consultants:  Cardiology  Procedures:  TTE DCCV  Antimicrobials:  Doxycycline 6/3>>   Subjective:  Patient seen examined bedside, resting comfortably.  Sitting  in bedside chair, eating breakfast.  No specific complaints this morning.  Seen by cardiology and continue to hold further diuresis due to bump in creatinine.  No other specific questions or concerns at this time.  Denies headache, no dizziness, no chest pain, no palpitations, no shortness of breath, no abdominal pain, no fever/chills/night sweats, no nausea/vomiting/diarrhea, no focal weakness, no fatigue, no paresthesias.  No acute events overnight per nursing staff.  Objective: Vitals:   06/22/22 0854 06/22/22 0856 06/22/22 1032 06/22/22 1220  BP: (!) 99/59 (!) 90/58  (!) 109/54  Pulse: 67 81  (!) 103  Resp:    16  Temp: 97.7 F (36.5 C)   97.8 F (36.6 C)  TempSrc: Oral   Oral  SpO2: 92%  95% 97%  Weight:      Height:        Intake/Output Summary (Last 24 hours) at 06/22/2022 1552 Last data filed at 06/22/2022 1306 Gross per 24 hour  Intake 870 ml  Output 1050 ml  Net -180 ml   Filed Weights   06/20/22 0500 06/21/22 0500 06/22/22 0327  Weight: 81.4 kg 81.3 kg 81.3 kg    Examination:  Physical Exam: GEN: NAD, alert and oriented x 3, wd/wn HEENT: NCAT, PERRL, EOMI, sclera clear, MMM PULM: CTAB w/o wheezes/crackles, normal respiratory effort CV: IIR, normal rate w/o M/G/R GI: abd soft, NTND, NABS, no R/G/M MSK: no peripheral edema, muscle strength globally intact 5/5 bilateral upper/lower extremities NEURO: CN II-XII intact, no focal deficits, sensation to light touch intact PSYCH: normal mood/affect Integumentary: dry/intact, no rashes or wounds    Data Reviewed: I have personally reviewed following labs and imaging studies  CBC: No results for input(s): "WBC", "NEUTROABS", "HGB", "HCT", "MCV", "PLT" in the last 168 hours. Basic Metabolic Panel: Recent Labs  Lab 06/16/22 0510 06/17/22 0530 06/18/22 0540 06/20/22 0454 06/21/22 0457 06/22/22 0452  NA 138 140 139 138 142 141  K 3.9 4.0 3.9 3.4* 4.3 3.6  CL 103 104 102 98 98 97*  CO2 25 26 30 31  33* 32  GLUCOSE  99 107* 99 100* 116* 106*  BUN 41* 35* 37* 33* 43* 48*  CREATININE 1.36* 1.11* 1.24* 1.23* 1.58* 1.60*  CALCIUM 8.9 9.2 9.0 9.0 9.5 9.4  MG 2.2 2.2 2.3  --   --   --    GFR: Estimated Creatinine Clearance: 24.8 mL/min (A) (by C-G formula based on SCr of 1.6 mg/dL (H)). Liver Function Tests: No results for input(s): "AST", "ALT", "ALKPHOS", "BILITOT", "PROT", "ALBUMIN" in the last 168 hours. No results for input(s): "LIPASE", "AMYLASE" in the last 168 hours. No results for input(s): "AMMONIA" in the last 168 hours. Coagulation Profile: No results for input(s): "INR", "PROTIME" in the last 168 hours. Cardiac Enzymes: No results for input(s): "CKTOTAL", "CKMB", "CKMBINDEX", "TROPONINI" in the last 168 hours. BNP (last 3 results) No results for input(s): "PROBNP" in the last 8760 hours. HbA1C: No results for input(s): "HGBA1C" in the last 72 hours. CBG: Recent Labs  Lab 06/20/22 0734 06/20/22 1117 06/20/22 1635  GLUCAP 95 126* 88   Lipid Profile: No results for input(s): "CHOL", "HDL", "LDLCALC", "TRIG", "CHOLHDL", "LDLDIRECT" in the last 72 hours. Thyroid Function Tests: No results for input(s): "TSH", "T4TOTAL", "FREET4", "T3FREE", "THYROIDAB" in the last 72 hours. Anemia Panel: No results for input(s): "VITAMINB12", "FOLATE", "FERRITIN", "TIBC", "IRON", "RETICCTPCT" in the last 72 hours. Sepsis Labs: No results for input(s): "PROCALCITON", "LATICACIDVEN" in the  last 168 hours.  No results found for this or any previous visit (from the past 240 hour(s)).       Radiology Studies: No results found.      Scheduled Meds:  amiodarone  200 mg Oral BID   apixaban  5 mg Oral BID   atorvastatin  40 mg Oral Daily   doxycycline  100 mg Oral Q12H   metoprolol tartrate  50 mg Oral Q6H   potassium chloride  20 mEq Oral BID   Continuous Infusions:   LOS: 10 days    Time spent: 52 minutes spent on chart review, discussion with nursing staff, consultants, updating family  and interview/physical exam; more than 50% of that time was spent in counseling and/or coordination of care.    Alvira Philips [place], DO [facility] Available via Epic secure chat 7am-7pm After these hours, please refer to coverage provider listed on [email or web address] 06/22/2022, 3:52 PM

## 2022-06-22 NOTE — Progress Notes (Signed)
Physical Therapy Treatment Patient Details Name: Brenda Matthews MRN: [id number] DOB: [birth year] Today's Date: 06/22/2022   History of Present Illness Ms. Zunker is an 87 yo female with PMH chronic dCHF, CKD3b, HTN, HLD who presented 06/11/22  with worsening shortness of breath and lower extremity swelling.    PT Comments    Pt agreeable to working with therapy. O2: 92% on RA at rest, 88% on RA during ambulation. Dyspnea 2/4.    Recommendations for follow up therapy are one component of a multi-disciplinary discharge planning process, led by the attending physician.  Recommendations may be updated based on patient status, additional functional criteria and insurance authorization.  Follow Up Recommendations  Can patient physically be transported by private vehicle: Yes    Assistance Recommended at Discharge Intermittent Supervision/Assistance  Patient can return home with the following A little help with bathing/dressing/bathroom;Assist for transportation;Help with stairs or ramp for entrance   Equipment Recommendations  None recommended by PT    Recommendations for Other Services       Precautions / Restrictions Precautions Precautions: Fall Precaution Comments: monitor sats Restrictions Weight Bearing Restrictions: No     Mobility  Bed Mobility               General bed mobility comments: sitting in recliner    Transfers Overall transfer level: Modified independent Equipment used: Rolling walker (2 wheels)   Sit to Stand: Modified independent (Device/Increase time)                Ambulation/Gait Ambulation/Gait assistance: Min guard Gait Distance (Feet): 85 Feet Assistive device: Rolling walker (2 wheels) Gait Pattern/deviations: Step-through pattern, Decreased stride length, Trunk flexed       General Gait Details: Min guard A for safety. RW used for ambulation safety. O2 88% on RA, dyspnea 2/4   Stairs             Wheelchair Mobility     Modified Rankin (Stroke Patients Only)       Balance Overall balance assessment: History of Falls, Needs assistance         Standing balance support: During functional activity Standing balance-Leahy Scale: Fair                              Cognition Arousal/Alertness: Awake/alert Behavior During Therapy: WFL for tasks assessed/performed Overall Cognitive Status: Within Functional Limits for tasks assessed                                          Exercises      General Comments        Pertinent Vitals/Pain Pain Assessment Pain Assessment: No/denies pain    Home Living                          Prior Function            PT Goals (current goals can now be found in the care plan section) Progress towards PT goals: Progressing toward goals    Frequency    Min 1X/week      PT Plan Current plan remains appropriate    Co-evaluation              AM-PAC PT "6 Clicks" Mobility   Outcome Measure  Help needed turning from your back to  your side while in a flat bed without using bedrails?: None Help needed moving from lying on your back to sitting on the side of a flat bed without using bedrails?: None Help needed moving to and from a bed to a chair (including a wheelchair)?: None Help needed standing up from a chair using your arms (e.g., wheelchair or bedside chair)?: None Help needed to walk in hospital room?: A Little Help needed climbing 3-5 steps with a railing? : A Little 6 Click Score: 22    End of Session   Activity Tolerance: Patient tolerated treatment well;Patient limited by fatigue Patient left: in chair;with call bell/phone within reach   PT Visit Diagnosis: Unsteadiness on feet (R26.81);History of falling (Z91.81);Difficulty in walking, not elsewhere classified (R26.2)     Time: 1610-9604 PT Time Calculation (min) (ACUTE ONLY): 9 min  Charges:  $Gait Training: 8-22 mins                         Faye Ramsay, PT Acute Rehabilitation  Office: [phone]

## 2022-06-22 NOTE — Progress Notes (Signed)
   06/22/22 0908  Provider Notification  Provider Name/Title Robet Leu, PA  Date Provider Notified 06/22/22  Time Provider Notified [phone]  Method of Notification Page (secure chat)  Notification Reason Change in status  Provider response Other (Comment) (hold spironolactone and metoprolol)  Date of Provider Response 06/22/22  Time of Provider Response 0908   BP 90/59 manually, HR 60-80 on tele, a. Fib. Pt asymptomatic.

## 2022-06-22 NOTE — Plan of Care (Signed)
  Problem: Health Behavior/Discharge Planning: Goal: Ability to manage health-related needs will improve Outcome: Progressing   Problem: Clinical Measurements: Goal: Ability to maintain clinical measurements within normal limits will improve Outcome: Progressing Goal: Will remain free from infection Outcome: Adequate for Discharge Goal: Diagnostic test results will improve Outcome: Progressing Goal: Respiratory complications will improve Outcome: Progressing Goal: Cardiovascular complication will be avoided Outcome: Progressing   Problem: Activity: Goal: Risk for activity intolerance will decrease Outcome: Adequate for Discharge   Problem: Nutrition: Goal: Adequate nutrition will be maintained Outcome: Adequate for Discharge   Problem: Coping: Goal: Level of anxiety will decrease Outcome: Adequate for Discharge   Problem: Elimination: Goal: Will not experience complications related to bowel motility Outcome: Adequate for Discharge Goal: Will not experience complications related to urinary retention Outcome: Adequate for Discharge   Problem: Pain Managment: Goal: General experience of comfort will improve Outcome: Adequate for Discharge   Problem: Safety: Goal: Ability to remain free from injury will improve Outcome: Adequate for Discharge   Problem: Skin Integrity: Goal: Risk for impaired skin integrity will decrease Outcome: Adequate for Discharge

## 2022-06-22 NOTE — Progress Notes (Signed)
Cardiology Progress Note  Patient ID: LOWEN FELLAND MRN: [id number] DOB: 07-Nov-1935 Date of Encounter: 06/22/2022  Primary Cardiologist: Chrystie Nose, MD  Subjective   Chief Complaint: None.   HPI: Euvolemic on examination.  BP is a bit soft.  Kidney function hopefully has plateaued.  ROS:  All other ROS reviewed and negative. Pertinent positives noted in the HPI.     Inpatient Medications  Scheduled Meds:  amiodarone  200 mg Oral BID   apixaban  5 mg Oral BID   atorvastatin  40 mg Oral Daily   doxycycline  100 mg Oral Q12H   metoprolol tartrate  50 mg Oral Q6H   potassium chloride  20 mEq Oral BID   Continuous Infusions:  PRN Meds: acetaminophen **OR** acetaminophen, albuterol, ALPRAZolam, guaiFENesin-dextromethorphan, hydrALAZINE, Muscle Rub, ondansetron **OR** ondansetron (ZOFRAN) IV, mouth rinse, traZODone   Vital Signs   Vitals:   06/22/22 0428 06/22/22 0854 06/22/22 0856 06/22/22 1032  BP: 111/64 (!) 99/59 (!) 90/58   Pulse: (!) 57 67 81   Resp: 18     Temp: 97.6 F (36.4 C) 97.7 F (36.5 C)    TempSrc: Oral Oral    SpO2: 97% 92%  95%  Weight:      Height:        Intake/Output Summary (Last 24 hours) at 06/22/2022 1054 Last data filed at 06/22/2022 0758 Gross per 24 hour  Intake 870 ml  Output 1750 ml  Net -880 ml      06/22/2022    3:27 AM 06/21/2022    5:00 AM 06/20/2022    5:00 AM  Last 3 Weights  Weight (lbs) 179 lb 3.7 oz 179 lb 2 oz 179 lb 8 oz  Weight (kg) 81.3 kg 81.25 kg 81.421 kg      Telemetry  Overnight telemetry shows A-fib 70 to 80 bpm, which I personally reviewed.    Physical Exam   Vitals:   06/22/22 0428 06/22/22 0854 06/22/22 0856 06/22/22 1032  BP: 111/64 (!) 99/59 (!) 90/58   Pulse: (!) 57 67 81   Resp: 18     Temp: 97.6 F (36.4 C) 97.7 F (36.5 C)    TempSrc: Oral Oral    SpO2: 97% 92%  95%  Weight:      Height:        Intake/Output Summary (Last 24 hours) at 06/22/2022 1054 Last data filed at 06/22/2022 0758 Gross per  24 hour  Intake 870 ml  Output 1750 ml  Net -880 ml       06/22/2022    3:27 AM 06/21/2022    5:00 AM 06/20/2022    5:00 AM  Last 3 Weights  Weight (lbs) 179 lb 3.7 oz 179 lb 2 oz 179 lb 8 oz  Weight (kg) 81.3 kg 81.25 kg 81.421 kg    Body mass index is 33.87 kg/m.  General: Well nourished, well developed, in no acute distress Head: Atraumatic, normal size  Eyes: PEERLA, EOMI  Neck: Supple, no JVD Endocrine: No thryomegaly Cardiac: Normal S1, S2; irregular rhythm Lungs: Clear to auscultation bilaterally, no wheezing, rhonchi or rales  Abd: Soft, nontender, no hepatomegaly  Ext: No edema, pulses 2+ Musculoskeletal: No deformities, BUE and BLE strength normal and equal Skin: Warm and dry, no rashes   Neuro: Alert and oriented to person, place, time, and situation, CNII-XII grossly intact, no focal deficits  Psych: Normal mood and affect   Labs  High Sensitivity Troponin:   Recent Labs  Lab  06/11/22 0916 06/11/22 1115  TROPONINIHS 8 8     Cardiac EnzymesNo results for input(s): "TROPONINI" in the last 168 hours. No results for input(s): "TROPIPOC" in the last 168 hours.  Chemistry Recent Labs  Lab 06/20/22 0454 06/21/22 0457 06/22/22 0452  NA 138 142 141  K 3.4* 4.3 3.6  CL 98 98 97*  CO2 31 33* 32  GLUCOSE 100* 116* 106*  BUN 33* 43* 48*  CREATININE 1.23* 1.58* 1.60*  CALCIUM 9.0 9.5 9.4  GFRNONAA 43* 32* 31*  ANIONGAP 9 11 12     HematologyNo results for input(s): "WBC", "RBC", "HGB", "HCT", "MCV", "MCH", "MCHC", "RDW", "PLT" in the last 168 hours. BNPNo results for input(s): "BNP", "PROBNP" in the last 168 hours.  DDimer No results for input(s): "DDIMER" in the last 168 hours.   Radiology  DG CHEST PORT 1 VIEW  Result Date: 06/20/2022 CLINICAL DATA:  Shortness of breath. EXAM: PORTABLE CHEST 1 VIEW COMPARISON:  06/11/2022. FINDINGS: Small right pleural effusion with adjacent right basilar atelectasis. Improved aeration of the left lung base. No pneumothorax. No  consolidation or pulmonary edema. Stable cardiac and mediastinal contours. IMPRESSION: Small right pleural effusion with adjacent right basilar atelectasis. Improved aeration of the left lung base. Electronically Signed   By: Orvan Falconer M.D.   On: 06/20/2022 11:39    Cardiac Studies  TTE 06/09/2022  1. Left ventricular ejection fraction, by estimation, is 50 to 55%. The  left ventricle has low normal function. Left ventricular endocardial  border not optimally defined to evaluate regional wall motion. There is  mild concentric left ventricular  hypertrophy. Left ventricular diastolic parameters are indeterminate.   2. Right ventricular systolic function is normal. The right ventricular  size is normal.   3. Left atrial size was moderately dilated.   4. Right atrial size was mildly dilated.   5. The mitral valve is grossly normal. Mild to moderate mitral valve  regurgitation.   6. The aortic valve is tricuspid. There is mild calcification of the  aortic valve. There is mild thickening of the aortic valve. Aortic valve  regurgitation is mild. Aortic valve sclerosis/calcification is present,  without any evidence of aortic  stenosis.   7. Aortic dilatation noted. There is mild dilatation of the ascending  aorta, measuring 41 mm.   Patient Profile  CLEOTILDE DEGRAFF is a 87 y.o. female with diastolic heart failure, paroxysmal atrial fibrillation, hypertension, CKD stage IIIb who was admitted on 06/11/2022 for acute hypoxic respiratory failure secondary to diastolic heart failure. Course also complicated by atrial fibrillation. Status post cardioversion on 06/17/2022. Had return of atrial fibrillation on 06/19/2022.   Assessment & Plan   # Acute hypoxic respiratory failure # Acute on chronic HFpEF -Euvolemic.  No further diuresis.  Has had slight rise in creatinine.  We will at this normalized.  Can likely transition to oral diuretics tomorrow.  # Persistent A-fib -BP is a bit low likely due  to overdiuresis.  Hold metoprolol and diltiazem.  Continue with amiodarone.  Final recommendations tomorrow.  If blood pressure improves and rates become uncontrolled can add back low-dose metoprolol 25 mg twice daily.  # AKI -Secondary to diuresis.  Suspect this will improve.  # Hypotension -Multifactorial in setting of AV nodal agents as well as diuresis.  Holding diuresis and AV nodal agents today.      For questions or updates, please contact [facility] Please consult [email or web address] for contact info under  Signed, Lenna Gilford. Flora Lipps, MD, [facility] [place]  [facility]  06/22/2022 10:54 AM

## 2022-06-23 DIAGNOSIS — I480 Paroxysmal atrial fibrillation: Secondary | ICD-10-CM | POA: Diagnosis not present

## 2022-06-23 DIAGNOSIS — I5033 Acute on chronic diastolic (congestive) heart failure: Secondary | ICD-10-CM | POA: Diagnosis not present

## 2022-06-23 LAB — MAGNESIUM: Magnesium: 2 mg/dL (ref 1.7–2.4)

## 2022-06-23 LAB — BASIC METABOLIC PANEL
Anion gap: 11 (ref 5–15)
BUN: 50 mg/dL — ABNORMAL HIGH (ref 8–23)
CO2: 29 mmol/L (ref 22–32)
Calcium: 9.1 mg/dL (ref 8.9–10.3)
Chloride: 98 mmol/L (ref 98–111)
Creatinine, Ser: 1.5 mg/dL — ABNORMAL HIGH (ref 0.44–1.00)
GFR, Estimated: 34 mL/min — ABNORMAL LOW (ref >60)
Glucose, Bld: 95 mg/dL (ref 70–99)
Potassium: 3.4 mmol/L — ABNORMAL LOW (ref 3.5–5.1)
Sodium: 138 mmol/L (ref 135–145)

## 2022-06-23 MED ORDER — METOPROLOL TARTRATE 50 MG PO TABS
100.0000 mg | ORAL_TABLET | Freq: Two times a day (BID) | ORAL | Status: DC
  Administered 2022-06-23 – 2022-06-24 (×2): 100 mg via ORAL
  Filled 2022-06-23 (×2): qty 2

## 2022-06-23 MED ORDER — POTASSIUM CHLORIDE CRYS ER 20 MEQ PO TBCR
30.0000 meq | EXTENDED_RELEASE_TABLET | ORAL | Status: AC
  Administered 2022-06-23 (×2): 30 meq via ORAL
  Filled 2022-06-23 (×2): qty 1

## 2022-06-23 NOTE — Progress Notes (Signed)
Cardiology Progress Note  Patient ID: Brenda Matthews MRN: [id number] DOB: 1935-05-17 Date of Encounter: 06/23/2022  Primary Cardiologist: Chrystie Nose, MD  Subjective   Chief Complaint: None.   HPI: Denies CP or SOB. BP improved.   ROS:  All other ROS reviewed and negative. Pertinent positives noted in the HPI.     Inpatient Medications  Scheduled Meds:  amiodarone  200 mg Oral BID   apixaban  5 mg Oral BID   atorvastatin  40 mg Oral Daily   metoprolol tartrate  100 mg Oral BID   potassium chloride  30 mEq Oral Q4H   Continuous Infusions:  PRN Meds: acetaminophen **OR** acetaminophen, albuterol, ALPRAZolam, guaiFENesin-dextromethorphan, hydrALAZINE, Muscle Rub, ondansetron **OR** ondansetron (ZOFRAN) IV, mouth rinse, traZODone   Vital Signs   Vitals:   06/23/22 0318 06/23/22 0318 06/23/22 0422 06/23/22 0923  BP: 118/81 118/81 110/69 115/64  Pulse: 86 89 (!) 54 (!) 54  Resp:   20   Temp:   97.7 F (36.5 C)   TempSrc:      SpO2:  96% 97%   Weight:   80.9 kg   Height:        Intake/Output Summary (Last 24 hours) at 06/23/2022 0949 Last data filed at 06/23/2022 0806 Gross per 24 hour  Intake 540 ml  Output --  Net 540 ml      06/23/2022    4:22 AM 06/22/2022    3:27 AM 06/21/2022    5:00 AM  Last 3 Weights  Weight (lbs) 178 lb 5.6 oz 179 lb 3.7 oz 179 lb 2 oz  Weight (kg) 80.9 kg 81.3 kg 81.25 kg      Telemetry  Overnight telemetry shows Afib 80-100 bpm, which I personally reviewed.    Physical Exam   Vitals:   06/23/22 0318 06/23/22 0318 06/23/22 0422 06/23/22 0923  BP: 118/81 118/81 110/69 115/64  Pulse: 86 89 (!) 54 (!) 54  Resp:   20   Temp:   97.7 F (36.5 C)   TempSrc:      SpO2:  96% 97%   Weight:   80.9 kg   Height:        Intake/Output Summary (Last 24 hours) at 06/23/2022 0949 Last data filed at 06/23/2022 0806 Gross per 24 hour  Intake 540 ml  Output --  Net 540 ml       06/23/2022    4:22 AM 06/22/2022    3:27 AM 06/21/2022    5:00 AM   Last 3 Weights  Weight (lbs) 178 lb 5.6 oz 179 lb 3.7 oz 179 lb 2 oz  Weight (kg) 80.9 kg 81.3 kg 81.25 kg    Body mass index is 33.7 kg/m.  General: Well nourished, well developed, in no acute distress Head: Atraumatic, normal size  Eyes: PEERLA, EOMI  Neck: Supple, no JVD Endocrine: No thryomegaly Cardiac: Normal S1, S2; irregular rhythm Lungs: Clear to auscultation bilaterally, no wheezing, rhonchi or rales  Abd: Soft, nontender, no hepatomegaly  Ext: Trace edema Musculoskeletal: No deformities, BUE and BLE strength normal and equal Skin: Warm and dry, no rashes   Neuro: Alert and oriented to person, place, time, and situation, CNII-XII grossly intact, no focal deficits  Psych: Normal mood and affect   Labs  High Sensitivity Troponin:   Recent Labs  Lab 06/11/22 0916 06/11/22 1115  TROPONINIHS 8 8     Cardiac EnzymesNo results for input(s): "TROPONINI" in the last 168 hours. No results for input(s): "TROPIPOC"  in the last 168 hours.  Chemistry Recent Labs  Lab 06/21/22 0457 06/22/22 0452 06/23/22 0456  NA 142 141 138  K 4.3 3.6 3.4*  CL 98 97* 98  CO2 33* 32 29  GLUCOSE 116* 106* 95  BUN 43* 48* 50*  CREATININE 1.58* 1.60* 1.50*  CALCIUM 9.5 9.4 9.1  GFRNONAA 32* 31* 34*  ANIONGAP 11 12 11     HematologyNo results for input(s): "WBC", "RBC", "HGB", "HCT", "MCV", "MCH", "MCHC", "RDW", "PLT" in the last 168 hours. BNPNo results for input(s): "BNP", "PROBNP" in the last 168 hours.  DDimer No results for input(s): "DDIMER" in the last 168 hours.   Radiology  No results found.  Cardiac Studies  TTE 06/09/2022  1. Left ventricular ejection fraction, by estimation, is 50 to 55%. The  left ventricle has low normal function. Left ventricular endocardial  border not optimally defined to evaluate regional wall motion. There is  mild concentric left ventricular  hypertrophy. Left ventricular diastolic parameters are indeterminate.   2. Right ventricular systolic  function is normal. The right ventricular  size is normal.   3. Left atrial size was moderately dilated.   4. Right atrial size was mildly dilated.   5. The mitral valve is grossly normal. Mild to moderate mitral valve  regurgitation.   6. The aortic valve is tricuspid. There is mild calcification of the  aortic valve. There is mild thickening of the aortic valve. Aortic valve  regurgitation is mild. Aortic valve sclerosis/calcification is present,  without any evidence of aortic  stenosis.   7. Aortic dilatation noted. There is mild dilatation of the ascending  aorta, measuring 41 mm.   Patient Profile  Brenda Matthews is a 87 y.o. female with diastolic heart failure, paroxysmal atrial fibrillation, hypertension, CKD stage IIIb who was admitted on 06/11/2022 for acute hypoxic respiratory failure secondary to diastolic heart failure. Course also complicated by atrial fibrillation. Status post cardioversion on 06/17/2022. Had return of atrial fibrillation on 06/19/2022.   Assessment & Plan   # Acute hypoxic respiratory failure # Acute on chronic diastolic heart failure -Euvolemic.  Kidney function has reached his plateau.  Matthews likely resume home Lasix tomorrow.  She is on 40 mg daily.  # Persistent A-fib -Status post cardioversion but has gone back into A-fib.  Continue amiodarone 200 mg twice daily.  There is a chance she may convert back to normal rhythm. -Continue Eliquis. -Add back metoprolol to tartrate 100 mg twice daily for rate control.  # AKI # Hypotension -Suspect secondary to overdiuresis.  Blood pressure and kidney function  [facility] will sign off.   Medication Recommendations: Medications as above Other recommendations (labs, testing, etc): None Follow up as an outpatient: We will arrange outpatient follow-up in 1-2 weeks  For questions or updates, please contact [facility] Please consult [email or web address] for contact info under         Signed, Gerri Spore T. Flora Lipps, MD, [facility] [place]  [facility]  06/23/2022 9:49 AM

## 2022-06-23 NOTE — Progress Notes (Signed)
PROGRESS NOTE    Brenda Matthews  OAC:[id number] DOB: 04/07/1935 DOA: 06/11/2022 PCP: Jeoffrey Massed, MD    Brief Narrative:   Brenda Matthews is a 87 y.o. female with past medical history significant for chronic diastolic congestive heart failure, CKD stage IIIb, paroxysmal atrial fibrillation, HTN, HLD who presented to [facility] ED on 5/25 with progressive shortness of breath, lower extremity edema.  Onset over the past 2 weeks.  Recently seen by cardiology outpatient in which her dose of Lasix was increased for 3 days with improvement of her symptoms initially.  Also endorses orthopnea.  Now her swelling in her lower extremities has worsened.  Given her symptoms, EMS was called and patient was transferred to the ED for further evaluation management.  In the ED, temperature 97.6 F, HR 96, RR 21, BP 124/89, SpO2 97% on room air.  WBC 8.1, hemoglobin 11.9, platelets 29.  Sodium 141, potassium 3.9, chloride 110, CO2 21, glucose 120, BUN 27, creatinine 0.94.  BNP 1132.  High sensitive troponin 8 followed by 8.  Chest x-ray with bibasilar collapse/consolidation, left greater than right with probable small left pleural effusion.  Patient was given IV Lasix.  TRH consulted for admission for further evaluation management of acute on chronic diastolic congestive heart failure exacerbation.  Assessment & Plan:   Acute on chronic diastolic congestive heart failure exacerbation Acute hypoxic respiratory failure, not POA: Resolved Patient presenting to ED with progressive shortness of breath and lower extremity edema.  Patient is afebrile without leukocytosis.  BNP elevated 1132.  Chest x-ray with findings consistent with pulmonary edema and small pleural effusion.  TTE on 06/09/2022 with LVEF 50-55%, biatrial enlargement, moderate MR, mild AR, no aortic stenosis, aortic dilation measuring 41 mm. --Cardiology following, appreciate assistance -- net + with 2 unmeasured urine occurrences past 24h  and net negative 11.3L since admission -- Wt 84.8>>85.1>>81.3>81.3>80.9kg -- Holding further diuresis today for elevated creatinine; cardiology recommends restarting oral home Lasix tomorrow -- 1800 mL fluid restriction -- Oxygen now titrated off -- Strict I's and O's, daily weights  Atrial fibrillation with RVR During hospitalization patient went A-fib with RVR.  Underwent DCCV on 06/17/2022, unfortunately did not remain in sinus rhythm. -- Cardiology following as above -- Amiodarone 200 mg p.o. twice daily -- Metoprolol tartrate 100 mg p.o. BID -- Eliquis 5 mg p.o. twice daily -- Continue monitor on telemetry  Acute on chronic CKD stage IIIb Baseline creatinine 1.1-1.2.  Creatinine increased to 1.60 likely secondary to aggressive IV diuresis. --Holding further diuresis per cardiology for now --Cr 0.94>>>1.60>1.50 --BMP in a.m.  Hyperlipidemia -- Atorvastatin 40 p.o. daily  Essential hypertension Home regimen includes amlodipine 10 mg p.o. daily, Lasix 40 mg p.o. daily, metoprolol tartrate 100 mg p.o. twice daily, -- Continue metoprolol tartrate 50 g p.o. every 6 hours -- Holding home amlodipine, furosemide for now  Right lower extremity cellulitis Completed course of doxycycline  Weakness/debility/deconditioning/gait disturbance: Patient currently resides at [facility] independent living.  Seen by PT and OT with recommendation of SNF placement. --TOC following, peer-to-peer requested by [facility] today, completed with anticipate discharge to [facility] tomorrow   DVT prophylaxis: Place TED hose Start: 06/19/22 0847 Place TED hose Start: 06/18/22 0831 SCDs Start: 06/11/22 1224 apixaban (ELIQUIS) tablet 5 mg    Code Status: Full Code Family Communication: No family present at bedside this morning  Disposition Plan:  Level of care: Telemetry Status is: Inpatient Remains inpatient appropriate because: Awaiting SNF placement/insurance  authorization and  cardiology sign off    Consultants:  Cardiology  Procedures:  TTE DCCV  Antimicrobials:  Doxycycline 6/3 - 6/5   Subjective: Patient seen examined bedside, resting comfortably.  Sitting in bedside chair, eating breakfast.  No specific complaints this morning.  Seen by cardiology and continue to hold further diuresis today with plan to restart tomorrow.  Peer-to-peer requested by [employer], completed and now with authorization for SNF placement.  Plan discharge to SNF tomorrow.  No other specific questions or concerns at this time.  Denies headache, no dizziness, no chest pain, no palpitations, no shortness of breath, no abdominal pain, no fever/chills/night sweats, no nausea/vomiting/diarrhea, no focal weakness, no fatigue, no paresthesias.  No acute events overnight per nursing staff.  Objective: Vitals:   06/23/22 0318 06/23/22 0422 06/23/22 0923 06/23/22 1034  BP: 118/81 110/69 115/64   Pulse: 89 (!) 54 (!) 54   Resp:  20    Temp:  97.7 F (36.5 C)    TempSrc:      SpO2: 96% 97%  (!) 88%  Weight:  80.9 kg    Height:        Intake/Output Summary (Last 24 hours) at 06/23/2022 1216 Last data filed at 06/23/2022 0806 Gross per 24 hour  Intake 540 ml  Output --  Net 540 ml   Filed Weights   06/21/22 0500 06/22/22 0327 06/23/22 0422  Weight: 81.3 kg 81.3 kg 80.9 kg    Examination:  Physical Exam: GEN: NAD, alert and oriented x 3, elderly in appearance HEENT: NCAT, PERRL, EOMI, sclera clear, MMM PULM: CTAB w/o wheezes/crackles, normal respiratory effort, on room air CV: IIR, normal rate w/o M/G/R GI: abd soft, NTND, NABS, no R/G/M MSK: Trace-1+ bilateral lower extremity peripheral edema, muscle strength globally intact 5/5 bilateral upper/lower extremities NEURO: CN II-XII intact, no focal deficits, sensation to light touch intact PSYCH: normal mood/affect Integumentary: dry/intact, no rashes or wounds    Data Reviewed: I have personally reviewed  following labs and imaging studies  CBC: No results for input(s): "WBC", "NEUTROABS", "HGB", "HCT", "MCV", "PLT" in the last 168 hours. Basic Metabolic Panel: Recent Labs  Lab 06/17/22 0530 06/18/22 0540 06/20/22 0454 06/21/22 0457 06/22/22 0452 06/23/22 0456  NA 140 139 138 142 141 138  K 4.0 3.9 3.4* 4.3 3.6 3.4*  CL 104 102 98 98 97* 98  CO2 26 30 31  33* 32 29  GLUCOSE 107* 99 100* 116* 106* 95  BUN 35* 37* 33* 43* 48* 50*  CREATININE 1.11* 1.24* 1.23* 1.58* 1.60* 1.50*  CALCIUM 9.2 9.0 9.0 9.5 9.4 9.1  MG 2.2 2.3  --   --   --  2.0   GFR: Estimated Creatinine Clearance: 26.4 mL/min (A) (by C-G formula based on SCr of 1.5 mg/dL (H)). Liver Function Tests: No results for input(s): "AST", "ALT", "ALKPHOS", "BILITOT", "PROT", "ALBUMIN" in the last 168 hours. No results for input(s): "LIPASE", "AMYLASE" in the last 168 hours. No results for input(s): "AMMONIA" in the last 168 hours. Coagulation Profile: No results for input(s): "INR", "PROTIME" in the last 168 hours. Cardiac Enzymes: No results for input(s): "CKTOTAL", "CKMB", "CKMBINDEX", "TROPONINI" in the last 168 hours. BNP (last 3 results) No results for input(s): "PROBNP" in the last 8760 hours. HbA1C: No results for input(s): "HGBA1C" in the last 72 hours. CBG: Recent Labs  Lab 06/20/22 0734 06/20/22 1117 06/20/22 1635  GLUCAP 95 126* 88   Lipid Profile: No results for input(s): "CHOL", "HDL", "LDLCALC", "  TRIG", "CHOLHDL", "LDLDIRECT" in the last 72 hours. Thyroid Function Tests: No results for input(s): "TSH", "T4TOTAL", "FREET4", "T3FREE", "THYROIDAB" in the last 72 hours. Anemia Panel: No results for input(s): "VITAMINB12", "FOLATE", "FERRITIN", "TIBC", "IRON", "RETICCTPCT" in the last 72 hours. Sepsis Labs: No results for input(s): "PROCALCITON", "LATICACIDVEN" in the last 168 hours.  No results found for this or any previous visit (from the past 240 hour(s)).       Radiology Studies: No results  found.      Scheduled Meds:  amiodarone  200 mg Oral BID   apixaban  5 mg Oral BID   atorvastatin  40 mg Oral Daily   metoprolol tartrate  100 mg Oral BID   Continuous Infusions:   LOS: 11 days    Time spent: 52 minutes spent on chart review, discussion with nursing staff, consultants, updating family and interview/physical exam; more than 50% of that time was spent in counseling and/or coordination of care.    Alvira Philips [place], DO [facility] Available via Epic secure chat 7am-7pm After these hours, please refer to coverage provider listed on [email or web address] 06/23/2022, 12:16 PM

## 2022-06-23 NOTE — Progress Notes (Signed)
Mobility Specialist - Progress Note   06/23/22 1034  Oxygen Therapy  SpO2 (!) 88 %  O2 Device Room Air  Patient Activity (if Appropriate) Ambulating  Mobility  Activity Ambulated with assistance in hallway  Level of Assistance Standby assist, set-up cues, supervision of patient - no hands on  Assistive Device Front wheel walker  Distance Ambulated (ft) 230 ft  Activity Response Tolerated well  Mobility Referral Yes  $Mobility charge 1 Mobility  Mobility Specialist Start Time (ACUTE ONLY) 1026  Mobility Specialist Stop Time (ACUTE ONLY) 1032  Mobility Specialist Time Calculation (min) (ACUTE ONLY) 6 min   Pt received in recliner and agreeable to mobility. Prior to ambulation, pt did not have [place] on. Pt requested to not use [place] when ambulating. When returning to room, pt desat to 88%. [place] put back on allowing O2 to come back up to 92%. No complaints during session. Pt to recliner after session with all needs met.   Pre-mobility: 90% SpO2 (RA) During mobility: 88% SpO2 (RA) Post-mobility: 92% SPO2 ([place])  Chief Technology Officer

## 2022-06-23 NOTE — TOC Progression Note (Addendum)
Transition of Care [facility]) - Progression Note    Patient Details  Name: Brenda Matthews MRN: [id number] Date of Birth: [birth year]  Transition of Care [facility]) CM/SW Contact  Adrian Prows, RN Phone Number: 06/23/2022, 9:11 AM  Clinical Narrative:    -received message from 06/22/22 at 1430- insurance requests additional documents; no skilled needs in info previously sent; info can be sent via portal or [phone] opt 3; member ID [id number], auth # [id number].  -0815- Notified pt's insurance MD requests a peer to peerr by 1PM today; please seen TOC note; contact # [phone] option 5; ID# [id number]; Dr [place] notfified via secure chat;   -[phone]- spoke w/ Marchelle Folks , Care Coordinator and she request last PT note be sent via portal; she also says it can be faxed to [phone] auth # [id number]   -1000- document faxed; electronic confirmation received at 1004.  -1141- approval received, Plan Auth ID# [id number], Auth ID # [id number], start 06/23/22 end 06/27/22; notified Dr [place]; he says plan for d/c  tomorrow; notified Cathy at facility; she gave assignment [place], RM# [id number]; call report # [phone]; Lynden Ang also says D/C Summary and FL2 needed by 1 PM; she says they can be fazed to [phone]; she would also TOC to call with pt's ETA; attempted to notify pt's son Terrianne Stuffle [phone]); LVM and awaiting return call.   Expected Discharge Plan: Skilled Nursing Facility Barriers to Discharge: Insurance Authorization  Expected Discharge Plan and Services   Discharge Planning Services: CM Consult Post Acute Care Choice: Skilled Nursing Facility Living arrangements for the past 2 months: Independent Living Facility                                       Social Determinants of Health (SDOH) Interventions SDOH Screenings   Food Insecurity: No Food Insecurity (06/11/2022)  Housing: Patient Declined (06/11/2022)  Transportation Needs: No Transportation Needs (06/11/2022)  Utilities:  Not At Risk (06/11/2022)  Alcohol Screen: Low Risk  (10/11/2020)  Depression (PHQ2-9): Low Risk  (10/27/2021)  Financial Resource Strain: Low Risk  (10/27/2021)  Physical Activity: Sufficiently Active (10/27/2021)  Social Connections: Moderately Integrated (10/27/2021)  Stress: No Stress Concern Present (10/27/2021)  Tobacco Use: Low Risk  (06/17/2022)    Readmission Risk Interventions     No data to display

## 2022-06-24 DIAGNOSIS — I150 Renovascular hypertension: Secondary | ICD-10-CM | POA: Diagnosis not present

## 2022-06-24 DIAGNOSIS — I739 Peripheral vascular disease, unspecified: Secondary | ICD-10-CM | POA: Diagnosis not present

## 2022-06-24 DIAGNOSIS — I130 Hypertensive heart and chronic kidney disease with heart failure and stage 1 through stage 4 chronic kidney disease, or unspecified chronic kidney disease: Secondary | ICD-10-CM | POA: Diagnosis not present

## 2022-06-24 DIAGNOSIS — R531 Weakness: Secondary | ICD-10-CM | POA: Diagnosis not present

## 2022-06-24 DIAGNOSIS — M5136 Other intervertebral disc degeneration, lumbar region: Secondary | ICD-10-CM | POA: Diagnosis not present

## 2022-06-24 DIAGNOSIS — I480 Paroxysmal atrial fibrillation: Secondary | ICD-10-CM | POA: Diagnosis not present

## 2022-06-24 DIAGNOSIS — I872 Venous insufficiency (chronic) (peripheral): Secondary | ICD-10-CM | POA: Diagnosis not present

## 2022-06-24 DIAGNOSIS — E785 Hyperlipidemia, unspecified: Secondary | ICD-10-CM | POA: Diagnosis not present

## 2022-06-24 DIAGNOSIS — Z79899 Other long term (current) drug therapy: Secondary | ICD-10-CM | POA: Diagnosis not present

## 2022-06-24 DIAGNOSIS — N179 Acute kidney failure, unspecified: Secondary | ICD-10-CM | POA: Diagnosis not present

## 2022-06-24 DIAGNOSIS — I5033 Acute on chronic diastolic (congestive) heart failure: Secondary | ICD-10-CM | POA: Diagnosis not present

## 2022-06-24 DIAGNOSIS — N183 Chronic kidney disease, stage 3 unspecified: Secondary | ICD-10-CM | POA: Diagnosis not present

## 2022-06-24 DIAGNOSIS — Z758 Other problems related to medical facilities and other health care: Secondary | ICD-10-CM | POA: Diagnosis not present

## 2022-06-24 DIAGNOSIS — Z7401 Bed confinement status: Secondary | ICD-10-CM | POA: Diagnosis not present

## 2022-06-24 DIAGNOSIS — I4891 Unspecified atrial fibrillation: Secondary | ICD-10-CM | POA: Diagnosis present

## 2022-06-24 DIAGNOSIS — Z7901 Long term (current) use of anticoagulants: Secondary | ICD-10-CM | POA: Diagnosis not present

## 2022-06-24 DIAGNOSIS — I4892 Unspecified atrial flutter: Secondary | ICD-10-CM | POA: Diagnosis present

## 2022-06-24 DIAGNOSIS — R0609 Other forms of dyspnea: Secondary | ICD-10-CM | POA: Diagnosis not present

## 2022-06-24 DIAGNOSIS — I5032 Chronic diastolic (congestive) heart failure: Secondary | ICD-10-CM | POA: Diagnosis not present

## 2022-06-24 DIAGNOSIS — K5790 Diverticulosis of intestine, part unspecified, without perforation or abscess without bleeding: Secondary | ICD-10-CM | POA: Diagnosis not present

## 2022-06-24 DIAGNOSIS — E7800 Pure hypercholesterolemia, unspecified: Secondary | ICD-10-CM | POA: Diagnosis not present

## 2022-06-24 DIAGNOSIS — N1832 Chronic kidney disease, stage 3b: Secondary | ICD-10-CM | POA: Diagnosis not present

## 2022-06-24 DIAGNOSIS — F419 Anxiety disorder, unspecified: Secondary | ICD-10-CM | POA: Diagnosis not present

## 2022-06-24 DIAGNOSIS — R5381 Other malaise: Secondary | ICD-10-CM | POA: Diagnosis not present

## 2022-06-24 DIAGNOSIS — I77819 Aortic ectasia, unspecified site: Secondary | ICD-10-CM | POA: Diagnosis not present

## 2022-06-24 DIAGNOSIS — Z7185 Encounter for immunization safety counseling: Secondary | ICD-10-CM | POA: Diagnosis not present

## 2022-06-24 LAB — BASIC METABOLIC PANEL
Anion gap: 10 (ref 5–15)
BUN: 44 mg/dL — ABNORMAL HIGH (ref 8–23)
CO2: 27 mmol/L (ref 22–32)
Calcium: 9.1 mg/dL (ref 8.9–10.3)
Chloride: 101 mmol/L (ref 98–111)
Creatinine, Ser: 1.49 mg/dL — ABNORMAL HIGH (ref 0.44–1.00)
GFR, Estimated: 34 mL/min — ABNORMAL LOW (ref >60)
Glucose, Bld: 102 mg/dL — ABNORMAL HIGH (ref 70–99)
Potassium: 3.6 mmol/L (ref 3.5–5.1)
Sodium: 138 mmol/L (ref 135–145)

## 2022-06-24 LAB — MAGNESIUM: Magnesium: 2.2 mg/dL (ref 1.7–2.4)

## 2022-06-24 MED ORDER — AMIODARONE HCL 200 MG PO TABS: 200.0000 mg | ORAL_TABLET | Freq: Two times a day (BID) | ORAL | 2 refills | Status: DC

## 2022-06-24 MED ORDER — ALPRAZOLAM 0.5 MG PO TABS: 0.5000 mg | ORAL_TABLET | Freq: Three times a day (TID) | ORAL | 0 refills | Status: DC | PRN

## 2022-06-24 NOTE — Discharge Summary (Signed)
Physician Discharge Summary  Brenda Matthews GEX:[id number] DOB: January 10, 1936 DOA: 06/11/2022  PCP: Jeoffrey Massed, MD  Admit date: 06/11/2022 Discharge date: 06/24/2022  Admitted From: [facility] Disposition: [facility]  Recommendations for Outpatient Follow-up:  Follow up with PCP in 1-2 weeks Amiodarone increased to 200 mg p.o. twice daily Amlodipine discontinued Outpatient follow-up with cardiology 07/05/2022 at 11 AM Outpatient follow-up with cardiology 08/04/2022 at 3:10 PM Please obtain BMP in one week to reassess renal function   Discharge Condition: Stable CODE STATUS: Full code Diet recommendation: Heart healthy diet with 1800 mL fluid restriction  History of present illness:  Brenda Matthews is a 87 y.o. female with past medical history significant for chronic diastolic congestive heart failure, CKD stage IIIb, paroxysmal atrial fibrillation, HTN, HLD who presented to [facility] ED on 5/25 with progressive shortness of breath, lower extremity edema.  Onset over the past 2 weeks.  Recently seen by cardiology outpatient in which her dose of Lasix was increased for 3 days with improvement of her symptoms initially.  Also endorses orthopnea.  Now her swelling in her lower extremities has worsened.  Given her symptoms, EMS was called and patient was transferred to the ED for further evaluation management.   In the ED, temperature 97.6 F, HR 96, RR 21, BP 124/89, SpO2 97% on room air.  WBC 8.1, hemoglobin 11.9, platelets 29.  Sodium 141, potassium 3.9, chloride 110, CO2 21, glucose 120, BUN 27, creatinine 0.94.  BNP 1132.  High sensitive troponin 8 followed by 8.  Chest x-ray with bibasilar collapse/consolidation, left greater than right with probable small left pleural effusion.  Patient was given IV Lasix.  TRH consulted for admission for further evaluation management of acute on chronic diastolic congestive heart failure exacerbation.  Hospital  course:  Acute on chronic diastolic congestive heart failure exacerbation Acute hypoxic respiratory failure, not POA: Resolved Patient presenting to ED with progressive shortness of breath and lower extremity edema.  Patient is afebrile without leukocytosis.  BNP elevated 1132.  Chest x-ray with findings consistent with pulmonary edema and small pleural effusion.  TTE on 06/09/2022 with LVEF 50-55%, biatrial enlargement, moderate MR, mild AR, no aortic stenosis, aortic dilation measuring 41 mm.  Cardiology was consulted and followed during hospital course.  Patient was aggressively diuresed with IV furosemide and transition back to 40 mg p.o. daily at time of discharge.  Outpatient follow-up with cardiology as scheduled.  Continue low-salt diet with 1800 mL fluid restriction.  Oxygen titrated off at time of discharge.  Continue to monitor weights; weight at time of discharge 81.2 kg.   Atrial fibrillation with RVR During hospitalization patient went A-fib with RVR. Cardiology was consulted and followed as above. Underwent DCCV on 06/17/2022, unfortunately did not remain in sinus rhythm.  Continue amiodarone 200 mg p.o. twice daily, metoprolol tartrate 100 mg p.o. twice daily, Eliquis 5 mg p.o. twice daily.  Outpatient follow-up with cardiology.   Acute on chronic CKD stage IIIb Baseline creatinine 1.1-1.2.  Creatinine increased to 1.60 likely secondary to aggressive IV diuresis.  Patient now resumed on her home diuretic.  Creatinine 1.49 at time of discharge.  Recommend repeat BMP 1 week.   Hyperlipidemia Atorvastatin 40 p.o. daily   Essential hypertension Continue Lasix 40 mg p.o. daily, metoprolol tartrate 100 mg p.o. twice daily  Right lower extremity cellulitis Completed course of doxycycline   Weakness/debility/deconditioning/gait disturbance: Patient currently resides at [facility] independent living.  Seen by PT and  OT with recommendation of SNF placement.  Completed peer to peer that  was requested by [facility] on 06/23/2022.  Discharging to [facility] today.  Discharge Diagnoses:  Principal Problem:   Acute on chronic diastolic CHF (congestive heart failure) (HCC) Active Problems:   PAF (paroxysmal atrial fibrillation) (HCC)   Chronic kidney disease, stage 3b (HCC)   Physical deconditioning   Essential hypertension   Pure hypercholesterolemia    Discharge Instructions  Discharge Instructions     Call MD for:  difficulty breathing, headache or visual disturbances   Complete by: As directed    Call MD for:  extreme fatigue   Complete by: As directed    Call MD for:  persistant dizziness or light-headedness   Complete by: As directed    Call MD for:  persistant nausea and vomiting   Complete by: As directed    Call MD for:  severe uncontrolled pain   Complete by: As directed    Call MD for:  temperature >100.4   Complete by: As directed    Diet - low sodium heart healthy   Complete by: As directed    Increase activity slowly   Complete by: As directed       Allergies as of 06/24/2022       Reactions   Augmentin [amoxicillin-pot Clavulanate] Nausea And Vomiting, Other (See Comments)   "projectile vomiting" Has patient had a PCN reaction causing immediate rash, facial/tongue/throat swelling, SOB or lightheadedness with hypotension:No Has patient had a PCN reaction causing severe rash involving mucus membranes or skin necrosis:No Has patient had a PCN reaction that required hospitalization:No Has patient had a PCN reaction occurring within the last 10 years:Yes If all of the above answers are "NO", then may proceed with Cephalosporin use.   Amoxicillin Rash   Clindamycin/lincomycin Rash        Medication List     STOP taking these medications    amLODipine 10 MG tablet Commonly known as: NORVASC   hydrALAZINE 50 MG tablet Commonly known as: APRESOLINE       TAKE these medications    acetaminophen 650 MG CR tablet Commonly  known as: TYLENOL Take 1,300 mg by mouth daily.   albuterol 108 (90 Base) MCG/ACT inhaler Commonly known as: VENTOLIN HFA INHALE 2 PUFFS BY MOUTH into THE lungs EVERY 4 HOURS AS NEEDED FOR WHEEZING OR SHORTNESS OF BREATH What changed: See the new instructions.   ALPRAZolam 0.5 MG tablet Commonly known as: XANAX Take 1 tablet (0.5 mg total) by mouth 3 (three) times daily as needed for anxiety. TAKE 1 TABLET BY MOUTH 3 TIMES DAILY AS NEEDED FOR ANXIETY What changed:  how much to take how to take this when to take this reasons to take this   amiodarone 200 MG tablet Commonly known as: PACERONE Take 1 tablet (200 mg total) by mouth 2 (two) times daily. What changed: when to take this   apixaban 5 MG Tabs tablet Commonly known as: ELIQUIS Take 1 tablet (5 mg total) by mouth 2 (two) times daily.   atorvastatin 40 MG tablet Commonly known as: LIPITOR TAKE 1 TABLET BY MOUTH EVERY DAY   Biotin 5000 MCG Tabs Take 5,000 mcg by mouth daily.   CALCIUM 600 PO Take 1,200 mg by mouth daily.   carboxymethylcellulose 0.5 % Soln Commonly known as: REFRESH PLUS Place 2 drops into both eyes daily as needed (dry/irritated eyes.).   Centrum Silver Ultra Womens Tabs Take 1 tablet by mouth  every evening.   Coenzyme Q10 200 MG capsule Take 200 mg by mouth daily.   fluticasone 50 MCG/ACT nasal spray Commonly known as: FLONASE Place 2 sprays into both nostrils daily.   furosemide 40 MG tablet Commonly known as: LASIX TAKE 1 TABLET BY MOUTH EVERY DAY   metoprolol tartrate 100 MG tablet Commonly known as: LOPRESSOR Take 1 tablet (100 mg total) by mouth 2 (two) times daily.   potassium chloride SA 20 MEQ tablet Commonly known as: KLOR-CON M TAKE 1 TABLET BY MOUTH 2 TIMES DAILY   PREVAGEN PO Take 1 tablet by mouth daily.   VITAMIN C PO Take by mouth daily.        Contact information for follow-up providers     [facility]. Go in 18  day(s).   Specialty: Cardiology Why: Hospital follow up 07/05/2022 @ 11 am PLEASE bring a current medication list to appointment FREE valet parking, Entrance C, off [employer] information: [address] [id number] mc [place] [address] [phone]        Ronney Asters, NP Follow up.   Specialty: Cardiology Why: Follow-up with General Cardiology scheduled for 08/04/2022 at 3:10pm. Please arrive 15 minutes early for check-in. If this date/ time does not work for you, please call our office to reschedule. Contact information: [address] [place] [address] [phone]         Jeoffrey Massed, MD. Schedule an appointment as soon as possible for a visit in 1 week(s).   Specialty: Family Medicine Contact information: [address] [place] [address] [phone]              Contact information for after-discharge care     Destination     [facility] .   Service: Skilled Nursing Contact information: [address] [place] [address] [phone]                    Allergies  Allergen Reactions   Augmentin [Amoxicillin-Pot Clavulanate] Nausea And Vomiting and Other (See Comments)    "projectile vomiting" Has patient had a PCN reaction causing immediate rash, facial/tongue/throat swelling, SOB or lightheadedness with hypotension:No Has patient had a PCN reaction causing severe rash involving mucus membranes or skin necrosis:No Has patient had a PCN reaction that required hospitalization:No Has patient had a PCN reaction occurring within the last 10 years:Yes If all of the above answers are "NO", then may proceed with Cephalosporin use.    Amoxicillin Rash   Clindamycin/Lincomycin Rash    Consultations: Cardiology   Procedures/Studies: DG CHEST PORT 1 VIEW  Result Date: 06/20/2022 CLINICAL DATA:  Shortness of breath. EXAM: PORTABLE CHEST  1 VIEW COMPARISON:  06/11/2022. FINDINGS: Small right pleural effusion with adjacent right basilar atelectasis. Improved aeration of the left lung base. No pneumothorax. No consolidation or pulmonary edema. Stable cardiac and mediastinal contours. IMPRESSION: Small right pleural effusion with adjacent right basilar atelectasis. Improved aeration of the left lung base. Electronically Signed   By: Orvan Falconer M.D.   On: 06/20/2022 11:39   EP STUDY  Result Date: 06/17/2022 See surgical note for result.  DG Chest Port 1 View  Result Date: 06/11/2022 CLINICAL DATA:  Shortness of breath. EXAM: PORTABLE CHEST 1 VIEW COMPARISON:  09/18/2020 FINDINGS: The cardio pericardial silhouette is enlarged. Bibasilar collapse/consolidation, left greater than right with probable small left effusion. Interstitial markings are diffusely coarsened  with chronic features. Bones are diffusely demineralized. Telemetry leads overlie the chest. IMPRESSION: Bibasilar collapse/consolidation, left greater than right with probable small left effusion. Electronically Signed   By: Kennith Center M.D.   On: 06/11/2022 09:16   ECHOCARDIOGRAM COMPLETE  Result Date: 06/09/2022    ECHOCARDIOGRAM REPORT   Patient Name:   Brenda Matthews  Date of Exam: 06/09/2022 Medical Rec #:  [id number]     Height:       61.5 in Accession #:    [id number]    Weight:       185.4 lb Date of Birth:  11-May-1935     BSA:          1.840 m Patient Age:    87 years      BP:           132/64 mmHg Patient Gender: F             HR:           95 bpm. Exam Location:  [address] Procedure: 2D Echo, Cardiac Doppler and Color Doppler Indications:    R06.02 SOB  History:        Patient has prior history of Echocardiogram examinations, most                 recent 08/10/2017. CHF, Abnormal ECG, Arrythmias:Atrial                 Fibrillation; Risk Factors:Hypertension and Dyslipidemia. Lower                 extremity edema. Dyspnea on exertion. Chronic kidney disease.   Sonographer:    Cathie Beams RCS Referring Phys: [phone] JENNIFER C WOODY IMPRESSIONS  1. Left ventricular ejection fraction, by estimation, is 50 to 55%. The left ventricle has low normal function. Left ventricular endocardial border not optimally defined to evaluate regional wall motion. There is mild concentric left ventricular hypertrophy. Left ventricular diastolic parameters are indeterminate.  2. Right ventricular systolic function is normal. The right ventricular size is normal.  3. Left atrial size was moderately dilated.  4. Right atrial size was mildly dilated.  5. The mitral valve is grossly normal. Mild to moderate mitral valve regurgitation.  6. The aortic valve is tricuspid. There is mild calcification of the aortic valve. There is mild thickening of the aortic valve. Aortic valve regurgitation is mild. Aortic valve sclerosis/calcification is present, without any evidence of aortic stenosis.  7. Aortic dilatation noted. There is mild dilatation of the ascending aorta, measuring 41 mm. Comparison(s): No significant change from prior study. FINDINGS  Left Ventricle: Left ventricular ejection fraction, by estimation, is 50 to 55%. The left ventricle has low normal function. Left ventricular endocardial border not optimally defined to evaluate regional wall motion. The left ventricular internal cavity  size was normal in size. There is mild concentric left ventricular hypertrophy. Left ventricular diastolic parameters are indeterminate. Right Ventricle: The right ventricular size is normal. No increase in right ventricular wall thickness. Right ventricular systolic function is normal. Left Atrium: Left atrial size was moderately dilated. Right Atrium: Right atrial size was mildly dilated. Pericardium: There is no evidence of pericardial effusion. Mitral Valve: The mitral valve is grossly normal. Mild to moderate mitral valve regurgitation. Tricuspid Valve: The tricuspid valve is normal in structure.  Tricuspid valve regurgitation is mild. Aortic Valve: The aortic valve is tricuspid. There is mild calcification of the aortic valve. There is mild thickening of the aortic valve. Aortic  valve regurgitation is mild. Aortic valve sclerosis/calcification is present, without any evidence of aortic stenosis. Pulmonic Valve: The pulmonic valve was normal in structure. Pulmonic valve regurgitation is trivial. Aorta: Aortic dilatation noted. There is mild dilatation of the ascending aorta, measuring 41 mm. IAS/Shunts: The atrial septum is grossly normal.  LEFT VENTRICLE PLAX 2D LVIDd:         4.50 cm LVIDs:         3.10 cm LV PW:         1.10 cm LV IVS:        1.30 cm LVOT diam:     2.10 cm LV SV:         55 LV SV Index:   30 LVOT Area:     3.46 cm  RIGHT VENTRICLE RV Basal diam:  3.00 cm RV S prime:     8.05 cm/s TAPSE (M-mode): 1.5 cm RVSP:           32.5 mmHg LEFT ATRIUM             Index        RIGHT ATRIUM            Index LA diam:        4.00 cm 2.17 cm/m   RA Pressure: 15.00 mmHg LA Vol (A2C):   59.5 ml 32.34 ml/m  RA Area:     19.60 cm LA Vol (A4C):   76.8 ml 41.74 ml/m  RA Volume:   56.80 ml   30.87 ml/m LA Biplane Vol: 68.5 ml 37.23 ml/m  AORTIC VALVE LVOT Vmax:   85.43 cm/s LVOT Vmean:  56.667 cm/s LVOT VTI:    0.158 m  AORTA Ao Root diam: 3.30 cm Ao Asc diam:  4.10 cm TRICUSPID VALVE TR Peak grad:   17.5 mmHg TR Vmax:        209.00 cm/s Estimated RAP:  15.00 mmHg RVSP:           32.5 mmHg  SHUNTS Systemic VTI:  0.16 m Systemic Diam: 2.10 cm Laurance Flatten MD Electronically signed by Laurance Flatten MD Signature Date/Time: 06/09/2022/3:34:48 PM    Final      Subjective: Patient seen examined bedside, resting comfortably.  Sitting in bedside chair.  Eating breakfast.  Discharging to SNF today.  No questions or concerns at this time.  Denies headache, no dizziness, no chest pain, no palpitations, no shortness of breath, no abdominal pain, no fever/chills/night sweats, no nausea/vomiting/diarrhea.  No  acute events overnight per nursing staff.  Discharge Exam: Vitals:   06/23/22 2014 06/24/22 0416  BP: 132/83 121/85  Pulse: 67 (!) 102  Resp: 20 20  Temp: 98.9 F (37.2 C) (!) 97.5 F (36.4 C)  SpO2: 93% 91%   Vitals:   06/23/22 1529 06/23/22 2014 06/24/22 0413 06/24/22 0416  BP: (!) 123/91 132/83  121/85  Pulse: (!) 101 67  (!) 102  Resp: 18 20  20   Temp: 98.7 F (37.1 C) 98.9 F (37.2 C)  (!) 97.5 F (36.4 C)  TempSrc: Oral Oral  Oral  SpO2: 95% 93%  91%  Weight:   81.2 kg   Height:        Physical Exam: GEN: NAD, alert and oriented x 3, elderly and appears consistent HEENT: NCAT, PERRL, EOMI, sclera clear, MMM PULM: CTAB w/o wheezes/crackles, normal respiratory effort, on room air CV: Irregularly irregular rhythm, normal rate w/o M/G/R GI: abd soft, NTND, NABS, no R/G/M MSK: Trace bilateral lower extremity peripheral  edema, muscle strength globally intact 5/5 bilateral upper/lower extremities NEURO: CN II-XII intact, no focal deficits, sensation to light touch intact PSYCH: normal mood/affect Integumentary: dry/intact, no rashes or wounds    The results of significant diagnostics from this hospitalization (including imaging, microbiology, ancillary and laboratory) are listed below for reference.     Microbiology: No results found for this or any previous visit (from the past 240 hour(s)).   Labs: BNP (last 3 results) Recent Labs    06/11/22 0916 06/12/22 0535 06/13/22 0821  BNP 1,132.1* 1,501.8* 1,077.2*   Basic Metabolic Panel: Recent Labs  Lab 06/18/22 0540 06/20/22 0454 06/21/22 0457 06/22/22 0452 06/23/22 0456 06/24/22 0538  NA 139 138 142 141 138 138  K 3.9 3.4* 4.3 3.6 3.4* 3.6  CL 102 98 98 97* 98 101  CO2 30 31 33* 32 29 27  GLUCOSE 99 100* 116* 106* 95 102*  BUN 37* 33* 43* 48* 50* 44*  CREATININE 1.24* 1.23* 1.58* 1.60* 1.50* 1.49*  CALCIUM 9.0 9.0 9.5 9.4 9.1 9.1  MG 2.3  --   --   --  2.0 2.2   Liver Function Tests: No results  for input(s): "AST", "ALT", "ALKPHOS", "BILITOT", "PROT", "ALBUMIN" in the last 168 hours. No results for input(s): "LIPASE", "AMYLASE" in the last 168 hours. No results for input(s): "AMMONIA" in the last 168 hours. CBC: No results for input(s): "WBC", "NEUTROABS", "HGB", "HCT", "MCV", "PLT" in the last 168 hours. Cardiac Enzymes: No results for input(s): "CKTOTAL", "CKMB", "CKMBINDEX", "TROPONINI" in the last 168 hours. BNP: Invalid input(s): "POCBNP" CBG: Recent Labs  Lab 06/20/22 0734 06/20/22 1117 06/20/22 1635  GLUCAP 95 126* 88   D-Dimer No results for input(s): "DDIMER" in the last 72 hours. Hgb A1c No results for input(s): "HGBA1C" in the last 72 hours. Lipid Profile No results for input(s): "CHOL", "HDL", "LDLCALC", "TRIG", "CHOLHDL", "LDLDIRECT" in the last 72 hours. Thyroid function studies No results for input(s): "TSH", "T4TOTAL", "T3FREE", "THYROIDAB" in the last 72 hours.  Invalid input(s): "FREET3" Anemia work up No results for input(s): "VITAMINB12", "FOLATE", "FERRITIN", "TIBC", "IRON", "RETICCTPCT" in the last 72 hours. Urinalysis    Component Value Date/Time   COLORURINE AMBER (A) 09/17/2020 2300   APPEARANCEUR CLOUDY (A) 09/17/2020 2300   LABSPEC 1.010 09/17/2020 2300   PHURINE 6.0 09/17/2020 2300   GLUCOSEU NEGATIVE 09/17/2020 2300   HGBUR NEGATIVE 09/17/2020 2300   HGBUR negative 12/10/2008 0000   BILIRUBINUR SMALL (A) 09/17/2020 2300   BILIRUBINUR negative 08/21/2014 1204   KETONESUR NEGATIVE 09/17/2020 2300   PROTEINUR 30 (A) 09/17/2020 2300   UROBILINOGEN 0.2 08/21/2014 1204   UROBILINOGEN 0.2 12/10/2008 0000   NITRITE NEGATIVE 09/17/2020 2300   LEUKOCYTESUR MODERATE (A) 09/17/2020 2300   Sepsis Labs No results for input(s): "WBC" in the last 168 hours.  Invalid input(s): "PROCALCITONIN", "LACTICIDVEN" Microbiology No results found for this or any previous visit (from the past 240 hour(s)).   Time coordinating discharge: Over 30  minutes  SIGNED:   Alvira Philips [place], DO  [facility] 06/24/2022, 9:42 AM

## 2022-06-24 NOTE — TOC Transition Note (Addendum)
Transition of Care [facility]) - CM/SW Discharge Note   Patient Details  Name: Brenda Matthews MRN: [id number] Date of Birth: 03/19/1935  Transition of Care [facility]) CM/SW Contact:  Adrian Prows, RN Phone Number: 06/24/2022, 11:18 AM   Clinical Narrative:    D/C orders received; pt notified and agrees to d/c plan; LVM for son Benna Arno [phone]); Cathy at facility notified pt will d/c today; documents faxed per her request to [phone] [facility] and D/C summary); Lynden Ang also gave assignment [facility], RM # [id number], call report # [phone] Alinda Money); awaiting electronic confirmation; PTAR called at 1122; spoke w/ Raiford Noble; no TOC needs; per Raiford Noble p/u will be within 1 -1.5hrs; Cathy at facility notified.  -[id number]- electronic confirmation received   Final next level of care: Skilled Nursing Facility Barriers to Discharge: No Barriers Identified   Patient Goals and CMS Choice CMS Medicare.gov Compare Post Acute Care list provided to:: Patient Represenative (must comment) Choice offered to / list presented to : Patient  Discharge Placement                Patient chooses bed at: [facility] Patient to be transferred to facility by: PTAR Name of family member notified: LVM for pt's son Alazne Quant Patient and family notified of of transfer: 06/24/22  Discharge Plan and Services Additional resources added to the After Visit Summary for     Discharge Planning Services: CM Consult Post Acute Care Choice: Skilled Nursing Facility                               Social Determinants of Health (SDOH) Interventions SDOH Screenings   Food Insecurity: No Food Insecurity (06/11/2022)  Housing: Patient Declined (06/11/2022)  Transportation Needs: No Transportation Needs (06/11/2022)  Utilities: Not At Risk (06/11/2022)  Alcohol Screen: Low Risk  (10/11/2020)  Depression (PHQ2-9): Low Risk  (10/27/2021)  Financial Resource Strain: Low Risk  (10/27/2021)  Physical Activity:  Sufficiently Active (10/27/2021)  Social Connections: Moderately Integrated (10/27/2021)  Stress: No Stress Concern Present (10/27/2021)  Tobacco Use: Low Risk  (06/17/2022)     Readmission Risk Interventions     No data to display

## 2022-06-24 NOTE — Care Management Important Message (Signed)
Important Message  Patient Details IM Letter given. Name: NICOLETTA HUSH MRN: [id number] Date of Birth: [birth year]   Medicare Important Message Given:  Yes     Caren Macadam 06/24/2022, 11:12 AM

## 2022-06-24 NOTE — Plan of Care (Signed)
  Problem: Health Behavior/Discharge Planning: Goal: Ability to manage health-related needs will improve Outcome: Not Progressing   Problem: Clinical Measurements: Goal: Ability to maintain clinical measurements within normal limits will improve Outcome: Not Progressing   

## 2022-06-24 NOTE — Progress Notes (Signed)
Report called and given to Alinda Money RN ([phone]) at 1250. Receiving RN aware transportation has been arranged. [place]

## 2022-06-28 DIAGNOSIS — N179 Acute kidney failure, unspecified: Secondary | ICD-10-CM | POA: Diagnosis not present

## 2022-06-28 DIAGNOSIS — I5033 Acute on chronic diastolic (congestive) heart failure: Secondary | ICD-10-CM | POA: Diagnosis not present

## 2022-06-28 DIAGNOSIS — Z79899 Other long term (current) drug therapy: Secondary | ICD-10-CM | POA: Diagnosis not present

## 2022-06-28 DIAGNOSIS — I150 Renovascular hypertension: Secondary | ICD-10-CM | POA: Diagnosis not present

## 2022-06-28 DIAGNOSIS — I480 Paroxysmal atrial fibrillation: Secondary | ICD-10-CM | POA: Diagnosis not present

## 2022-06-28 DIAGNOSIS — R5381 Other malaise: Secondary | ICD-10-CM | POA: Diagnosis not present

## 2022-06-28 DIAGNOSIS — I77819 Aortic ectasia, unspecified site: Secondary | ICD-10-CM | POA: Diagnosis not present

## 2022-06-28 DIAGNOSIS — E785 Hyperlipidemia, unspecified: Secondary | ICD-10-CM | POA: Diagnosis not present

## 2022-06-28 DIAGNOSIS — I872 Venous insufficiency (chronic) (peripheral): Secondary | ICD-10-CM | POA: Diagnosis not present

## 2022-06-28 DIAGNOSIS — N1832 Chronic kidney disease, stage 3b: Secondary | ICD-10-CM | POA: Diagnosis not present

## 2022-06-30 DIAGNOSIS — I5033 Acute on chronic diastolic (congestive) heart failure: Secondary | ICD-10-CM | POA: Diagnosis not present

## 2022-06-30 DIAGNOSIS — R0609 Other forms of dyspnea: Secondary | ICD-10-CM | POA: Diagnosis not present

## 2022-06-30 DIAGNOSIS — Z7185 Encounter for immunization safety counseling: Secondary | ICD-10-CM | POA: Diagnosis not present

## 2022-07-05 ENCOUNTER — Ambulatory Visit (HOSPITAL_COMMUNITY)
Admit: 2022-07-05 | Discharge: 2022-07-05 | Disposition: A | Discharge status: Home / Self Care | Payer: Medicare PPO | Attending: Adult Health | Admitting: Adult Health

## 2022-07-05 ENCOUNTER — Encounter (HOSPITAL_COMMUNITY): Payer: Self-pay

## 2022-07-05 VITALS — BP 129/75 | HR 122 | Wt 177.0 lb

## 2022-07-05 DIAGNOSIS — I4891 Unspecified atrial fibrillation: Secondary | ICD-10-CM

## 2022-07-05 DIAGNOSIS — E785 Hyperlipidemia, unspecified: Secondary | ICD-10-CM | POA: Insufficient documentation

## 2022-07-05 DIAGNOSIS — N1832 Chronic kidney disease, stage 3b: Secondary | ICD-10-CM | POA: Insufficient documentation

## 2022-07-05 DIAGNOSIS — I130 Hypertensive heart and chronic kidney disease with heart failure and stage 1 through stage 4 chronic kidney disease, or unspecified chronic kidney disease: Secondary | ICD-10-CM | POA: Diagnosis not present

## 2022-07-05 DIAGNOSIS — Z7901 Long term (current) use of anticoagulants: Secondary | ICD-10-CM | POA: Diagnosis not present

## 2022-07-05 DIAGNOSIS — I739 Peripheral vascular disease, unspecified: Secondary | ICD-10-CM | POA: Insufficient documentation

## 2022-07-05 DIAGNOSIS — I4892 Unspecified atrial flutter: Secondary | ICD-10-CM | POA: Diagnosis not present

## 2022-07-05 DIAGNOSIS — I5032 Chronic diastolic (congestive) heart failure: Secondary | ICD-10-CM | POA: Diagnosis not present

## 2022-07-05 DIAGNOSIS — Z79899 Other long term (current) drug therapy: Secondary | ICD-10-CM | POA: Insufficient documentation

## 2022-07-05 DIAGNOSIS — I480 Paroxysmal atrial fibrillation: Secondary | ICD-10-CM

## 2022-07-05 DIAGNOSIS — N183 Chronic kidney disease, stage 3 unspecified: Secondary | ICD-10-CM

## 2022-07-05 LAB — CBC
HCT: 40.8 % (ref 36.0–46.0)
Hemoglobin: 12.8 g/dL (ref 12.0–15.0)
MCH: 28.4 pg (ref 26.0–34.0)
MCHC: 31.4 g/dL (ref 30.0–36.0)
MCV: 90.7 fL (ref 80.0–100.0)
Platelets: 189 10*3/uL (ref 150–400)
RBC: 4.5 MIL/uL (ref 3.87–5.11)
RDW: 14.8 % (ref 11.5–15.5)
WBC: 8.3 10*3/uL (ref 4.0–10.5)
nRBC: 0 % (ref 0.0–0.2)

## 2022-07-05 LAB — BRAIN NATRIURETIC PEPTIDE: B Natriuretic Peptide: 1272.1 pg/mL — ABNORMAL HIGH (ref 0.0–100.0)

## 2022-07-05 LAB — BASIC METABOLIC PANEL
Anion gap: 9 (ref 5–15)
BUN: 22 mg/dL (ref 8–23)
CO2: 25 mmol/L (ref 22–32)
Calcium: 8.7 mg/dL — ABNORMAL LOW (ref 8.9–10.3)
Chloride: 105 mmol/L (ref 98–111)
Creatinine, Ser: 1.28 mg/dL — ABNORMAL HIGH (ref 0.44–1.00)
GFR, Estimated: 41 mL/min — ABNORMAL LOW (ref >60)
Glucose, Bld: 129 mg/dL — ABNORMAL HIGH (ref 70–99)
Potassium: 4 mmol/L (ref 3.5–5.1)
Sodium: 139 mmol/L (ref 135–145)

## 2022-07-05 MED ORDER — FUROSEMIDE 40 MG PO TABS: 80.0000 mg | ORAL_TABLET | Freq: Every day | ORAL | 2 refills | Status: DC

## 2022-07-05 MED ORDER — AMIODARONE HCL 400 MG PO TABS: 400.0000 mg | ORAL_TABLET | Freq: Two times a day (BID) | ORAL | 2 refills | Status: DC

## 2022-07-05 MED ORDER — POTASSIUM CHLORIDE CRYS ER 20 MEQ PO TBCR: 40.0000 meq | EXTENDED_RELEASE_TABLET | Freq: Two times a day (BID) | ORAL | 2 refills | Status: DC

## 2022-07-05 NOTE — Progress Notes (Signed)
ReDS Vest / Clip - 07/05/22 1101       ReDS Vest / Clip   Station Marker A    Ruler Value 32    ReDS Value Range Moderate volume overload    ReDS Actual Value 38    Anatomical Comments sitting

## 2022-07-05 NOTE — Progress Notes (Signed)
HEART & VASCULAR TRANSITION OF CARE CONSULT NOTE     Referring Physician: Dr [place]  Primary Care: Dr Milinda Cave Primary Cardiologist: Dr Rennis Golden   HPI: Referred to clinic by Dr U[place]  for heart failure consultation.   Brenda Matthews is a 87 year old with a history of HFpEF, CKD Stage IIIb, PAD, HTN, PAF, and HLD.   EKG - 04/2022 SR  Admitted 06/11/22 with A/C HFpEF , Acute Hypoxic Respiratory Failure, and A fib RVR.  Cardiology consult. Diuresed with IV lasix and transitioned to po lasix 40 mg twice a day.  Had cardioversion 06/17/22 back to SR but went back in A fib. Placed on amio 200 mg twice a day and metoprolol tartrate 100 mg twice a day. Alos treated for lower extremity cllulitis. Discharged to [facility]. Discharged 06/24/22.  Complaining of fatigue.  SOB with exertion. Denies PND/Orthopnea. Using 2 liters oxygen at night. Appetite ok. Following low sodium diet and limiting fluid. No fever or chills. Weight at home 176 pounds. Taking all medications. Currently at SNF at [facility] for therapy.   Cardiac Testing  Echo 05/2022 Ef 50-55% ,RV normal,  LA moderately dilated, and RA mildly dilated.   LHC 2019 - No significant coronary disease.   Review of Systems: [y] = yes, [ ]  = no   General: Weight gain [ ] ; Weight loss [ ] ; Anorexia [ ] ; Fatigue [Y    ]; Fever [ ] ; Chills [ ] ; Weakness [ ]   Cardiac: Chest pain/pressure [ ] ; Resting SOB [ ] ; Exertional SOB [ Y]; Orthopnea [ ] ; Pedal Edema [Y ]; Palpitations [ ] ; Syncope [ ] ; Presyncope [ ] ; Paroxysmal nocturnal dyspnea[ ]   Pulmonary: Cough [ ] ; Wheezing[ ] ; Hemoptysis[ ] ; Sputum [ ] ; Snoring [ ]   GI: Vomiting[ ] ; Dysphagia[ ] ; Melena[ ] ; Hematochezia [ ] ; Heartburn[ ] ; Abdominal pain [ ] ; Constipation [ ] ; Diarrhea [ ] ; BRBPR [ ]   GU: Hematuria[ ] ; Dysuria [ ] ; Nocturia[ ]   Vascular: Pain in legs with walking [ ] ; Pain in feet with lying flat [ ] ; Non-healing sores [ ] ; Stroke [ ] ; TIA [ ] ; Slurred speech [ ] ;  Neuro:  Headaches[ ] ; Vertigo[ ] ; Seizures[ ] ; Paresthesias[ ] ;Blurred vision [ ] ; Diplopia [ ] ; Vision changes [ ]   Ortho/Skin: Arthritis [ ] ; Joint pain [ Y]; Muscle pain [ ] ; Joint swelling [ ] ; Back Pain [ Y]; Rash [ ]   Psych: Depression[ ] ; Anxiety[ ]   Heme: Bleeding problems [ ] ; Clotting disorders [ ] ; Anemia [ ]   Endocrine: Diabetes [ ] ; Thyroid dysfunction[ ]    Past Medical History:  Diagnosis Date   Abnormal EKG approx 2008   Nuclear stress test neg;    Acute pancreatitis 09/2020   idiopathic.  +panc psudocyst   Anxiety    with panic   CAP (community acquired pneumonia) 08/2017   Hospitalization for CAP/acute diast HF/rapid a-fib   Cataract    s/p surgery--lens implants   Chronic diastolic heart failure (HCC) 2019   Chronic renal insufficiency, stage 3 (moderate) (HCC) 12/04/2012   Renal u/s when in hosp 08/2017 for CAP/CHF showed symmetric kidneys, echogenicity normal, w/out hydronephrosis. Baseline GFR around 40 ml/min as of 10/2018.   DDD (degenerative disc disease), lumbar    Diverticulosis 2009   Fracture of radial shaft, left, closed 11/16/2010   fell down flight of stairs   History of kidney stones    Hx of adenomatous colonic polyps 2002;2009;2015   surveillance colonoscopy 2009, +polypectomy  done-tubular adenoma w/out high grade.  05/2013 tubular adenomas--recall 3 yrs   Hyperlipidemia    Hypertension    Low TSH level 02/18/2016   T3 norm, T4 mildly elevated--suspected sick euthyroid syndrome.  Repeat labs 06/2016: normal.   Melanoma in situ (HCC) 06/2018   L LL   Osteoarthritis of both knees    viscosupplementation injections helpful 2020/21   Osteopenia    DEXA 08/2010; repeat DEXA 02/2015 worse: fosamax started.  06/2018 Dexa T score -2.4.  2020 maj osteop fx risk = 24%, Hip fx risk 7.3%. 09/2020 T score -2.1   PAF (paroxysmal atrial fibrillation) (HCC) 02/2016   when in post-op for ankle surgery; spontaneously converted in hosp, seen by Dr. Rennis Golden in  consultation--metoprolol rate control + xarelto recommended.  Metop d/c due to hypot.  Plan to cont xarelto 20 mg qd indef due to CHAD-VASc score of 3.  A-fib w/RVR and CHF 07/2017; pt placed on amiodarone and plan for CV, but pt was in sinus rhythm when she went in for her DC CV, so she was sent home.   Peripheral edema    Pneumonia 2015   hx with sepsis   Rheumatic fever     Current Outpatient Medications  Medication Sig Dispense Refill   acetaminophen (TYLENOL) 650 MG CR tablet Take 1,300 mg by mouth daily.     albuterol (VENTOLIN HFA) 108 (90 Base) MCG/ACT inhaler INHALE 2 PUFFS BY MOUTH into THE lungs EVERY 4 HOURS AS NEEDED FOR WHEEZING OR SHORTNESS OF BREATH (Patient taking differently: Inhale 2 puffs into the lungs See admin instructions. INHALE 2 PUFFS BY MOUTH into THE lungs EVERY 4 HOURS AS NEEDED FOR WHEEZING OR SHORTNESS OF BREATH) 18 g 1   ALPRAZolam (XANAX) 0.5 MG tablet Take 1 tablet (0.5 mg total) by mouth 3 (three) times daily as needed for anxiety. TAKE 1 TABLET BY MOUTH 3 TIMES DAILY AS NEEDED FOR ANXIETY 30 tablet 0   amiodarone (PACERONE) 200 MG tablet Take 1 tablet (200 mg total) by mouth 2 (two) times daily. 60 tablet 2   apixaban (ELIQUIS) 5 MG TABS tablet Take 1 tablet (5 mg total) by mouth 2 (two) times daily. 180 tablet 3   Apoaequorin (PREVAGEN PO) Take 1 tablet by mouth daily.     Ascorbic Acid (VITAMIN C PO) Take by mouth daily.     atorvastatin (LIPITOR) 40 MG tablet TAKE 1 TABLET BY MOUTH EVERY DAY 90 tablet 1   Biotin 5000 MCG TABS Take 5,000 mcg by mouth daily.     Calcium Carbonate (CALCIUM 600 PO) Take 1,200 mg by mouth daily.      carboxymethylcellulose (REFRESH PLUS) 0.5 % SOLN Place 2 drops into both eyes daily as needed (dry/irritated eyes.).     Coenzyme Q10 200 MG capsule Take 200 mg by mouth daily.     fluticasone (FLONASE) 50 MCG/ACT nasal spray Place 2 sprays into both nostrils daily.      furosemide (LASIX) 40 MG tablet TAKE 1 TABLET BY MOUTH EVERY  DAY 90 tablet 1   hydrocortisone cream 1 % Apply 1 Application topically as needed for itching.     metoprolol tartrate (LOPRESSOR) 100 MG tablet Take 1 tablet (100 mg total) by mouth 2 (two) times daily. 180 tablet 2   Multiple Vitamins-Minerals (CENTRUM SILVER ULTRA WOMENS) TABS Take 1 tablet by mouth every evening.     potassium chloride SA (KLOR-CON M) 20 MEQ tablet TAKE 1 TABLET BY MOUTH 2 TIMES DAILY (Patient taking  differently: Take 20 mEq by mouth 2 (two) times daily.) 180 tablet 3   No current facility-administered medications for this encounter.    Allergies  Allergen Reactions   Augmentin [Amoxicillin-Pot Clavulanate] Nausea And Vomiting and Other (See Comments)    "projectile vomiting" Has patient had a PCN reaction causing immediate rash, facial/tongue/throat swelling, SOB or lightheadedness with hypotension:No Has patient had a PCN reaction causing severe rash involving mucus membranes or skin necrosis:No Has patient had a PCN reaction that required hospitalization:No Has patient had a PCN reaction occurring within the last 10 years:Yes If all of the above answers are "NO", then may proceed with Cephalosporin use.    Amoxicillin Rash   Clindamycin/Lincomycin Rash      Social History   Socioeconomic History   Marital status: Widowed    Spouse name: Not on file   Number of children: Not on file   Years of education: Not on file   Highest education level: Not on file  Occupational History   Not on file  Tobacco Use   Smoking status: Never   Smokeless tobacco: Never  Vaping Use   Vaping Use: Never used  Substance and Sexual Activity   Alcohol use: Yes    Comment: rarely   Drug use: Never   Sexual activity: Not on file  Other Topics Concern   Not on file  Social History Narrative   Widow, 2 sons.   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   No tobacco.  Rare alcohol.   No drugs.  Exercise: 4 times per week, about 4mi.   Social Determinants of Health   Financial  Resource Strain: Low Risk  (10/27/2021)   Overall Financial Resource Strain (CARDIA)    Difficulty of Paying Living Expenses: Not hard at all  Food Insecurity: No Food Insecurity (06/11/2022)   Hunger Vital Sign    Worried About Running Out of Food in the Last Year: Never true    Ran Out of Food in the Last Year: Never true  Transportation Needs: No Transportation Needs (06/11/2022)   PRAPARE - Administrator, Civil Service (Medical): No    Lack of Transportation (Non-Medical): No  Physical Activity: Sufficiently Active (10/27/2021)   Exercise Vital Sign    Days of Exercise per Week: 5 days    Minutes of Exercise per Session: 30 min  Stress: No Stress Concern Present (10/27/2021)   [employer] of Occupational Health - Occupational Stress Questionnaire    Feeling of Stress : Not at all  Social Connections: Moderately Integrated (10/27/2021)   Social Connection and Isolation Panel [NHANES]    Frequency of Communication with Friends and Family: More than three times a week    Frequency of Social Gatherings with Friends and Family: More than three times a week    Attends Religious Services: More than 4 times per year    Active Member of [employer] or Organizations: Yes    Attends Banker Meetings: More than 4 times per year    Marital Status: Widowed  Intimate Partner Violence: Not At Risk (06/11/2022)   Humiliation, Afraid, Rape, and Kick questionnaire    Fear of Current or Ex-Partner: No    Emotionally Abused: No    Physically Abused: No    Sexually Abused: No      Family History  Problem Relation Age of Onset   Heart disease Mother    Heart disease Father    Hypertension Brother    Diabetes Sister  Colon cancer Neg Hx    Pancreatic cancer Neg Hx    Rectal cancer Neg Hx    Stomach cancer Neg Hx     Vitals:   07/05/22 1101  BP: 129/75  Pulse: (!) 122  SpO2: 92%  Weight: 80.3 kg (177 lb)   Wt Readings from Last 3 Encounters:  07/05/22 80.3 kg  (177 lb)  06/24/22 81.2 kg (179 lb 0.2 oz)  04/28/22 84.1 kg (185 lb 6.4 oz)    PHYSICAL EXAM: General:  Elderly. Arrived in a wheel chair.  No respiratory difficulty HEENT: normal Neck: supple. JVP 10-11. Carotids 2+ bilat; no bruits. No lymphadenopathy or thryomegaly appreciated. Cor: PMI nondisplaced. Tachy Irregular rate & rhythm. No rubs, gallops or murmurs. Lungs: clear Abdomen: soft, nontender, distended. No hepatosplenomegaly. No bruits or masses. Good bowel sounds. Extremities: no cyanosis, clubbing, rash, R and LLE 1-2+edema Neuro: alert & oriented x 3, cranial nerves grossly intact. moves all 4 extremities w/o difficulty. Affect pleasant.  ECG: Afib 105 bpm    ASSESSMENT & PLAN: 1. HFpEF Echo EF 50-55% RV normal.  NYHA III. Reds Clip 38%. Volume status trending up. Suspected worsening with recurrent A fib RVR.  GDMT -  Diuretic- Increase lasix to 80 mg daily and will increase potassium to 40 meq twice a day.  BB- Continue current dose to lopressor.  Ace/ARB/ARNI MRA- Consider spiro next visit.  SGLT2i- consider next visit.   2. A fib  Last in SR 04/2022.  Had cardioversion 06/17/22 and was back in SR but went back into A fib.  Plan was rate control. Discharged on amio 200 mg twice a day and metoprolol tartrate 100 mg twice a day.  - She will need sleep study at some point.  Today she is more symptomatic and volume overloaded as noted above.  EKG today - A fib 105 bpm .  - Increase amio to 400 mg twice a day. Continue eliquis 5 mg twice a day.  - I think we should attempt another cardioversion next week. Hopefully with higher diuretics and amio, cardioversion will be more effective.  Informed Consent   Shared Decision Making/Informed Consent{ ATTESTATION ORDER      Dr Gala Romney discussed with her during the office visit. The risks (stroke, cardiac arrhythmias rarely resulting in the need for a temporary or permanent pacemaker, skin irritation or burns and  complications associated with conscious sedation including aspiration, arrhythmia, respiratory failure and death), benefits (restoration of normal sinus rhythm) and alternatives of a direct current cardioversion were explained in detail to Brenda. Tullius and she agrees to proceed.       3. CKD Stage IIIb Creatinine baseline 1.3-1.5  Check BMET today   Check CBC, BNP, BMET today.   Referred to HFSW (PCP, Medications, Transportation, ETOH Abuse, Drug Abuse, Insurance, Financial ): No Refer to Pharmacy:  No Refer to Home Health: No Refer to Advanced Heart Failure Clinic: Yes Refer to General Cardiology: Shared  Follow up with Dr Gala Romney in 3-4 weeks.   Alasha Mcguinness NP-C  1:46 PM

## 2022-07-05 NOTE — H&P (View-Only) (Signed)
  HEART & VASCULAR TRANSITION OF CARE CONSULT NOTE     Referring Physician: Dr Austria  Primary Care: Dr McGowen Primary Cardiologist: Dr Hilty   HPI: Referred to clinic by Dr Austria  for heart failure consultation.   Ms Cooks is a 87 year old with a history of HFpEF, CKD Stage IIIb, PAD, HTN, PAF, and HLD.   EKG - 04/2022 SR  Admitted 06/11/22 with A/C HFpEF , Acute Hypoxic Respiratory Failure, and A fib RVR.  Cardiology consult. Diuresed with IV lasix and transitioned to po lasix 40 mg twice a day.  Had cardioversion 06/17/22 back to SR but went back in A fib. Placed on amio 200 mg twice a day and metoprolol tartrate 100 mg twice a day. Alos treated for lower extremity cllulitis. Discharged to [facility]. Discharged 06/24/22.  Complaining of fatigue.  SOB with exertion. Denies PND/Orthopnea. Using 2 liters oxygen at night. Appetite ok. Following low sodium diet and limiting fluid. No fever or chills. Weight at home 176 pounds. Taking all medications. Currently at SNF at [facility] for therapy.   Cardiac Testing  Echo 05/2022 Ef 50-55% ,RV normal,  LA moderately dilated, and RA mildly dilated.   LHC 2019 - No significant coronary disease.   Review of Systems: [y] = yes, [ ] = no   General: Weight gain [ ]; Weight loss [ ]; Anorexia [ ]; Fatigue [Y    ]; Fever [ ]; Chills [ ]; Weakness [ ]  Cardiac: Chest pain/pressure [ ]; Resting SOB [ ]; Exertional SOB [ Y]; Orthopnea [ ]; Pedal Edema [Y ]; Palpitations [ ]; Syncope [ ]; Presyncope [ ]; Paroxysmal nocturnal dyspnea[ ]  Pulmonary: Cough [ ]; Wheezing[ ]; Hemoptysis[ ]; Sputum [ ]; Snoring [ ]  GI: Vomiting[ ]; Dysphagia[ ]; Melena[ ]; Hematochezia [ ]; Heartburn[ ]; Abdominal pain [ ]; Constipation [ ]; Diarrhea [ ]; BRBPR [ ]  GU: Hematuria[ ]; Dysuria [ ]; Nocturia[ ]  Vascular: Pain in legs with walking [ ]; Pain in feet with lying flat [ ]; Non-healing sores [ ]; Stroke [ ]; TIA [ ]; Slurred speech [ ];  Neuro:  Headaches[ ]; Vertigo[ ]; Seizures[ ]; Paresthesias[ ];Blurred vision [ ]; Diplopia [ ]; Vision changes [ ]  Ortho/Skin: Arthritis [ ]; Joint pain [ Y]; Muscle pain [ ]; Joint swelling [ ]; Back Pain [ Y]; Rash [ ]  Psych: Depression[ ]; Anxiety[ ]  Heme: Bleeding problems [ ]; Clotting disorders [ ]; Anemia [ ]  Endocrine: Diabetes [ ]; Thyroid dysfunction[ ]   Past Medical History:  Diagnosis Date   Abnormal EKG approx 2008   Nuclear stress test neg;    Acute pancreatitis 09/2020   idiopathic.  +panc psudocyst   Anxiety    with panic   CAP (community acquired pneumonia) 08/2017   Hospitalization for CAP/acute diast HF/rapid a-fib   Cataract    s/p surgery--lens implants   Chronic diastolic heart failure (HCC) 2019   Chronic renal insufficiency, stage 3 (moderate) (HCC) 12/04/2012   Renal u/s when in hosp 08/2017 for CAP/CHF showed symmetric kidneys, echogenicity normal, w/out hydronephrosis. Baseline GFR around 40 ml/min as of 10/2018.   DDD (degenerative disc disease), lumbar    Diverticulosis 2009   Fracture of radial shaft, left, closed 11/16/2010   fell down flight of stairs   History of kidney stones    Hx of adenomatous colonic polyps 2002;2009;2015   surveillance colonoscopy 2009, +polypectomy   done-tubular adenoma w/out high grade.  05/2013 tubular adenomas--recall 3 yrs   Hyperlipidemia    Hypertension    Low TSH level 02/18/2016   T3 norm, T4 mildly elevated--suspected sick euthyroid syndrome.  Repeat labs 06/2016: normal.   Melanoma in situ (HCC) 06/2018   L LL   Osteoarthritis of both knees    viscosupplementation injections helpful 2020/21   Osteopenia    DEXA 08/2010; repeat DEXA 02/2015 worse: fosamax started.  06/2018 Dexa T score -2.4.  2020 maj osteop fx risk = 24%, Hip fx risk 7.3%. 09/2020 T score -2.1   PAF (paroxysmal atrial fibrillation) (HCC) 02/2016   when in post-op for ankle surgery; spontaneously converted in hosp, seen by Dr. Hilty in  consultation--metoprolol rate control + xarelto recommended.  Metop d/c due to hypot.  Plan to cont xarelto 20 mg qd indef due to CHAD-VASc score of 3.  A-fib w/RVR and CHF 07/2017; pt placed on amiodarone and plan for CV, but pt was in sinus rhythm when she went in for her DC CV, so she was sent home.   Peripheral edema    Pneumonia 2015   hx with sepsis   Rheumatic fever     Current Outpatient Medications  Medication Sig Dispense Refill   acetaminophen (TYLENOL) 650 MG CR tablet Take 1,300 mg by mouth daily.     albuterol (VENTOLIN HFA) 108 (90 Base) MCG/ACT inhaler INHALE 2 PUFFS BY MOUTH into THE lungs EVERY 4 HOURS AS NEEDED FOR WHEEZING OR SHORTNESS OF BREATH (Patient taking differently: Inhale 2 puffs into the lungs See admin instructions. INHALE 2 PUFFS BY MOUTH into THE lungs EVERY 4 HOURS AS NEEDED FOR WHEEZING OR SHORTNESS OF BREATH) 18 g 1   ALPRAZolam (XANAX) 0.5 MG tablet Take 1 tablet (0.5 mg total) by mouth 3 (three) times daily as needed for anxiety. TAKE 1 TABLET BY MOUTH 3 TIMES DAILY AS NEEDED FOR ANXIETY 30 tablet 0   amiodarone (PACERONE) 200 MG tablet Take 1 tablet (200 mg total) by mouth 2 (two) times daily. 60 tablet 2   apixaban (ELIQUIS) 5 MG TABS tablet Take 1 tablet (5 mg total) by mouth 2 (two) times daily. 180 tablet 3   Apoaequorin (PREVAGEN PO) Take 1 tablet by mouth daily.     Ascorbic Acid (VITAMIN C PO) Take by mouth daily.     atorvastatin (LIPITOR) 40 MG tablet TAKE 1 TABLET BY MOUTH EVERY DAY 90 tablet 1   Biotin 5000 MCG TABS Take 5,000 mcg by mouth daily.     Calcium Carbonate (CALCIUM 600 PO) Take 1,200 mg by mouth daily.      carboxymethylcellulose (REFRESH PLUS) 0.5 % SOLN Place 2 drops into both eyes daily as needed (dry/irritated eyes.).     Coenzyme Q10 200 MG capsule Take 200 mg by mouth daily.     fluticasone (FLONASE) 50 MCG/ACT nasal spray Place 2 sprays into both nostrils daily.      furosemide (LASIX) 40 MG tablet TAKE 1 TABLET BY MOUTH EVERY  DAY 90 tablet 1   hydrocortisone cream 1 % Apply 1 Application topically as needed for itching.     metoprolol tartrate (LOPRESSOR) 100 MG tablet Take 1 tablet (100 mg total) by mouth 2 (two) times daily. 180 tablet 2   Multiple Vitamins-Minerals (CENTRUM SILVER ULTRA WOMENS) TABS Take 1 tablet by mouth every evening.     potassium chloride SA (KLOR-CON M) 20 MEQ tablet TAKE 1 TABLET BY MOUTH 2 TIMES DAILY (Patient taking   differently: Take 20 mEq by mouth 2 (two) times daily.) 180 tablet 3   No current facility-administered medications for this encounter.    Allergies  Allergen Reactions   Augmentin [Amoxicillin-Pot Clavulanate] Nausea And Vomiting and Other (See Comments)    "projectile vomiting" Has patient had a PCN reaction causing immediate rash, facial/tongue/throat swelling, SOB or lightheadedness with hypotension:No Has patient had a PCN reaction causing severe rash involving mucus membranes or skin necrosis:No Has patient had a PCN reaction that required hospitalization:No Has patient had a PCN reaction occurring within the last 10 years:Yes If all of the above answers are "NO", then may proceed with Cephalosporin use.    Amoxicillin Rash   Clindamycin/Lincomycin Rash      Social History   Socioeconomic History   Marital status: Widowed    Spouse name: Not on file   Number of children: Not on file   Years of education: Not on file   Highest education level: Not on file  Occupational History   Not on file  Tobacco Use   Smoking status: Never   Smokeless tobacco: Never  Vaping Use   Vaping Use: Never used  Substance and Sexual Activity   Alcohol use: Yes    Comment: rarely   Drug use: Never   Sexual activity: Not on file  Other Topics Concern   Not on file  Social History Narrative   Widow, 2 sons.   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   No tobacco.  Rare alcohol.   No drugs.  Exercise: 4 times per week, about 4mi.   Social Determinants of Health   Financial  Resource Strain: Low Risk  (10/27/2021)   Overall Financial Resource Strain (CARDIA)    Difficulty of Paying Living Expenses: Not hard at all  Food Insecurity: No Food Insecurity (06/11/2022)   Hunger Vital Sign    Worried About Running Out of Food in the Last Year: Never true    Ran Out of Food in the Last Year: Never true  Transportation Needs: No Transportation Needs (06/11/2022)   PRAPARE - Transportation    Lack of Transportation (Medical): No    Lack of Transportation (Non-Medical): No  Physical Activity: Sufficiently Active (10/27/2021)   Exercise Vital Sign    Days of Exercise per Week: 5 days    Minutes of Exercise per Session: 30 min  Stress: No Stress Concern Present (10/27/2021)   [employer] - Occupational Stress Questionnaire    Feeling of Stress : Not at all  Social Connections: Moderately Integrated (10/27/2021)   Social Connection and Isolation Panel [NHANES]    Frequency of Communication with Friends and Family: More than three times a week    Frequency of Social Gatherings with Friends and Family: More than three times a week    Attends Religious Services: More than 4 times per year    Active Member of Clubs or Organizations: Yes    Attends Club or Organization Meetings: More than 4 times per year    Marital Status: Widowed  Intimate Partner Violence: Not At Risk (06/11/2022)   Humiliation, Afraid, Rape, and Kick questionnaire    Fear of Current or Ex-Partner: No    Emotionally Abused: No    Physically Abused: No    Sexually Abused: No      Family History  Problem Relation Age of Onset   Heart disease Mother    Heart disease Father    Hypertension Brother    Diabetes Sister      Colon cancer Neg Hx    Pancreatic cancer Neg Hx    Rectal cancer Neg Hx    Stomach cancer Neg Hx     Vitals:   07/05/22 1101  BP: 129/75  Pulse: (!) 122  SpO2: 92%  Weight: 80.3 kg (177 lb)   Wt Readings from Last 3 Encounters:  07/05/22 80.3 kg  (177 lb)  06/24/22 81.2 kg (179 lb 0.2 oz)  04/28/22 84.1 kg (185 lb 6.4 oz)    PHYSICAL EXAM: General:  Elderly. Arrived in a wheel chair.  No respiratory difficulty HEENT: normal Neck: supple. JVP 10-11. Carotids 2+ bilat; no bruits. No lymphadenopathy or thryomegaly appreciated. Cor: PMI nondisplaced. Tachy Irregular rate & rhythm. No rubs, gallops or murmurs. Lungs: clear Abdomen: soft, nontender, distended. No hepatosplenomegaly. No bruits or masses. Good bowel sounds. Extremities: no cyanosis, clubbing, rash, R and LLE 1-2+edema Neuro: alert & oriented x 3, cranial nerves grossly intact. moves all 4 extremities w/o difficulty. Affect pleasant.  ECG: Afib 105 bpm    ASSESSMENT & PLAN: 1. HFpEF Echo EF 50-55% RV normal.  NYHA III. Reds Clip 38%. Volume status trending up. Suspected worsening with recurrent A fib RVR.  GDMT -  Diuretic- Increase lasix to 80 mg daily and will increase potassium to 40 meq twice a day.  BB- Continue current dose to lopressor.  Ace/ARB/ARNI MRA- Consider spiro next visit.  SGLT2i- consider next visit.   2. A fib  Last in SR 04/2022.  Had cardioversion 06/17/22 and was back in SR but went back into A fib.  Plan was rate control. Discharged on amio 200 mg twice a day and metoprolol tartrate 100 mg twice a day.  - She will need sleep study at some point.  Today she is more symptomatic and volume overloaded as noted above.  EKG today - A fib 105 bpm .  - Increase amio to 400 mg twice a day. Continue eliquis 5 mg twice a day.  - I think we should attempt another cardioversion next week. Hopefully with higher diuretics and amio, cardioversion will be more effective.  Informed Consent   Shared Decision Making/Informed Consent{ ATTESTATION ORDER      Dr Bensimhon discussed with her during the office visit. The risks (stroke, cardiac arrhythmias rarely resulting in the need for a temporary or permanent pacemaker, skin irritation or burns and  complications associated with conscious sedation including aspiration, arrhythmia, respiratory failure and death), benefits (restoration of normal sinus rhythm) and alternatives of a direct current cardioversion were explained in detail to Ms. Duffee and she agrees to proceed.       3. CKD Stage IIIb Creatinine baseline 1.3-1.5  Check BMET today   Check CBC, BNP, BMET today.   Referred to HFSW (PCP, Medications, Transportation, ETOH Abuse, Drug Abuse, Insurance, Financial ): No Refer to Pharmacy:  No Refer to Home Health: No Refer to [facility]: Yes Refer to General Cardiology: Shared  Follow up with Dr Bensimhon in 3-4 weeks.   Corbet Hanley NP-C  1:46 PM   

## 2022-07-05 NOTE — Patient Instructions (Signed)
RedsClip done today.  EKG done today.  Labs done today. We will contact you only if your labs are abnormal.  INCREASE Amiodarone to 400mg  (1 tablet) by mouth 2 times daily.   INCREASE Lasix to 80mg  (2 tablets) by mouth 2 times daily.   INCREASE Potassium to (2 tablets) by mouth 2 times daily.   No other medication changes were made. Please continue all current medications as prescribed.  If you have any questions or concerns before your next appointment please send [place] a message through [place] or call our office at [phone].    TO LEAVE A MESSAGE FOR THE NURSE SELECT OPTION 2, PLEASE LEAVE A MESSAGE INCLUDING: YOUR NAME DATE OF BIRTH CALL BACK NUMBER REASON FOR CALL**this is important as we prioritize the call backs  YOU WILL RECEIVE A CALL BACK THE SAME DAY AS LONG AS YOU CALL BEFORE 4:00 PM   Do the following things EVERYDAY: Weigh yourself in the morning before breakfast. Write it down and keep it in a log. Take your medicines as prescribed Eat low salt foods--Limit salt (sodium) to 2000 mg per day.  Stay as active as you can everyday Limit all fluids for the day to less than 2 liters   At the [facility], you and your health needs are our priority. As part of our continuing mission to provide you with exceptional heart care, we have created designated Provider Care Teams. These Care Teams include your primary Cardiologist (physician) and Advanced Practice Providers (APPs- Physician Assistants and Nurse Practitioners) who all work together to provide you with the care you need, when you need it.   You may see any of the following providers on your designated Care Team at your next follow up: Dr Arvilla Meres Dr Marca Ancona Dr. Marcos Eke, NP Robbie Lis, [place] [facility], [place] Brynda Peon, NP Karle Plumber, PharmD   Please be sure to bring in all your medications bottles to every appointment.     Thank you for choosing [facility]      You are scheduled for a Cardioversion on Monday, June 24 with Dr. Arvilla Meres.  Please arrive at the [facility] (Main Entrance A) at [facility]: [address] [place], [place] [address] at 11:00 AM (This time is 1 hour(s) before your procedure to ensure your preparation). Free valet parking service is available. You will check in at ADMITTING. The support person will be asked to wait in the waiting room.  It is OK to have someone drop you off and come back when you are ready to be discharged.      DIET:  Nothing to eat or drink after midnight except a sip of water with medications (see medication instructions below)  MEDICATION INSTRUCTIONS: IF ANY NEW MEDICATIONS ARE STARTED AFTER TODAY, PLEASE NOTIFY YOUR PROVIDER AS SOON AS POSSIBLE!!   Continue taking your anticoagulant (blood thinner): Apixaban (Eliquis).   DO NOT TAKE LASIX THE DAY OF YOUR PROCEDURE.  FYI:  For your safety, and to allow [place] to monitor your vital signs accurately during the surgery/procedure we request: If you have artificial nails, gel coating, SNS etc, please have those removed prior to your surgery/procedure. Not having the nail coverings /polish removed may result in cancellation or delay of your surgery/procedure.  You must have a responsible person to drive you home and stay in the waiting area during your procedure. Failure to do so could  result in cancellation.  Bring your insurance cards.  *Special Note: Every effort is made to have your procedure done on time. Occasionally there are emergencies that occur at the hospital that may cause delays. Please be patient if a delay does occur.

## 2022-07-06 DIAGNOSIS — Z758 Other problems related to medical facilities and other health care: Secondary | ICD-10-CM | POA: Diagnosis not present

## 2022-07-06 DIAGNOSIS — I5033 Acute on chronic diastolic (congestive) heart failure: Secondary | ICD-10-CM | POA: Diagnosis not present

## 2022-07-06 DIAGNOSIS — I4891 Unspecified atrial fibrillation: Secondary | ICD-10-CM | POA: Diagnosis not present

## 2022-07-07 ENCOUNTER — Ambulatory Visit: Payer: Medicare PPO | Admitting: Family Medicine

## 2022-07-08 NOTE — Pre-Procedure Instructions (Signed)
Spoke to patient on phone regarding cardioversion on Monday:  Instructed to arrive at 0630, NPO after midnight on Sunday  Confirmed patient has a ride home and responsible person to stay with patient for 24 hours after the procedure  Confirmed no missed doses of Eliquis, instructed patient to ensure to continue taking it and take in the AM on Monday with a sip of water

## 2022-07-08 NOTE — Pre-Procedure Instructions (Signed)
Spoke to [place], nurse at [facility], regarding cardioversion on Monday - reiterated instructions to arrive at 0630, NPO after midnight on Sunday, take eliquis Monday AM with a sip of water

## 2022-07-09 DIAGNOSIS — I5033 Acute on chronic diastolic (congestive) heart failure: Secondary | ICD-10-CM | POA: Diagnosis not present

## 2022-07-10 NOTE — Anesthesia Preprocedure Evaluation (Signed)
Anesthesia Evaluation  Patient identified by MRN, date of birth, ID band Patient awake    Reviewed: Allergy & Precautions, NPO status , Patient's Chart, lab work & pertinent test results, reviewed documented beta blocker date and time   Airway Mallampati: II  TM Distance: >3 FB Neck ROM: Full    Dental  (+) Edentulous Upper, Edentulous Lower, Lower Dentures, Upper Dentures   Pulmonary neg pulmonary ROS   Pulmonary exam normal breath sounds clear to auscultation       Cardiovascular hypertension, Pt. on medications and Pt. on home beta blockers +CHF and + DOE  Normal cardiovascular exam+ dysrhythmias (eliquis) Atrial Fibrillation  Rhythm:Irregular Rate:Normal  TTE 2024 1. Left ventricular ejection fraction, by estimation, is 50 to 55%. The  left ventricle has low normal function. Left ventricular endocardial  border not optimally defined to evaluate regional wall motion. There is  mild concentric left ventricular  hypertrophy. Left ventricular diastolic parameters are indeterminate.   2. Right ventricular systolic function is normal. The right ventricular  size is normal.   3. Left atrial size was moderately dilated.   4. Right atrial size was mildly dilated.   5. The mitral valve is grossly normal. Mild to moderate mitral valve  regurgitation.   6. The aortic valve is tricuspid. There is mild calcification of the  aortic valve. There is mild thickening of the aortic valve. Aortic valve  regurgitation is mild. Aortic valve sclerosis/calcification is present,  without any evidence of aortic  stenosis.   7. Aortic dilatation noted. There is mild dilatation of the ascending  aorta, measuring 41 mm.     Neuro/Psych  PSYCHIATRIC DISORDERS Anxiety     negative neurological ROS     GI/Hepatic negative GI ROS, Neg liver ROS,,,  Endo/Other  negative endocrine ROS    Renal/GU Renal Insufficiency and CRFRenal disease  negative  genitourinary   Musculoskeletal  (+) Arthritis ,    Abdominal   Peds  Hematology negative hematology ROS (+)   Anesthesia Other Findings 87 y.o. female past medical history of chronic diastolic heart failure, chronic kidney disease stage IIIb, paroxysmal atrial fibrillation essential hypertension .  Reproductive/Obstetrics                             Anesthesia Physical Anesthesia Plan  ASA: 4  Anesthesia Plan: General   Post-op Pain Management: Minimal or no pain anticipated   Induction: Intravenous  PONV Risk Score and Plan: Propofol infusion and Treatment may vary due to age or medical condition  Airway Management Planned: Natural Airway and Simple Face Mask  Additional Equipment: None  Intra-op Plan:   Post-operative Plan:   Informed Consent: I have reviewed the patients History and Physical, chart, labs and discussed the procedure including the risks, benefits and alternatives for the proposed anesthesia with the patient or authorized representative who has indicated his/her understanding and acceptance.     Dental advisory given  Plan Discussed with: CRNA and Anesthesiologist  Anesthesia Plan Comments:        Anesthesia Quick Evaluation

## 2022-07-11 ENCOUNTER — Encounter (HOSPITAL_COMMUNITY)
Admission: RE | Disposition: A | Discharge status: Skilled Nursing Facility | Payer: Self-pay | Source: Home / Self Care | Attending: Internal Medicine

## 2022-07-11 ENCOUNTER — Ambulatory Visit (HOSPITAL_COMMUNITY)
Admission: RE | Admit: 2022-07-11 | Discharge: 2022-07-11 | Disposition: A | Discharge status: Skilled Nursing Facility | Payer: Medicare PPO | Attending: Internal Medicine | Admitting: Internal Medicine

## 2022-07-11 ENCOUNTER — Ambulatory Visit (HOSPITAL_BASED_OUTPATIENT_CLINIC_OR_DEPARTMENT_OTHER): Payer: Medicare PPO | Admitting: Anesthesiology

## 2022-07-11 ENCOUNTER — Other Ambulatory Visit: Payer: Self-pay

## 2022-07-11 ENCOUNTER — Encounter (HOSPITAL_COMMUNITY): Payer: Self-pay | Admitting: Internal Medicine

## 2022-07-11 ENCOUNTER — Ambulatory Visit (HOSPITAL_COMMUNITY): Payer: Medicare PPO | Admitting: Anesthesiology

## 2022-07-11 DIAGNOSIS — N1832 Chronic kidney disease, stage 3b: Secondary | ICD-10-CM | POA: Insufficient documentation

## 2022-07-11 DIAGNOSIS — I5033 Acute on chronic diastolic (congestive) heart failure: Secondary | ICD-10-CM | POA: Diagnosis not present

## 2022-07-11 DIAGNOSIS — N189 Chronic kidney disease, unspecified: Secondary | ICD-10-CM

## 2022-07-11 DIAGNOSIS — I509 Heart failure, unspecified: Secondary | ICD-10-CM

## 2022-07-11 DIAGNOSIS — I130 Hypertensive heart and chronic kidney disease with heart failure and stage 1 through stage 4 chronic kidney disease, or unspecified chronic kidney disease: Secondary | ICD-10-CM

## 2022-07-11 DIAGNOSIS — I5032 Chronic diastolic (congestive) heart failure: Secondary | ICD-10-CM | POA: Insufficient documentation

## 2022-07-11 DIAGNOSIS — Z79899 Other long term (current) drug therapy: Secondary | ICD-10-CM | POA: Insufficient documentation

## 2022-07-11 DIAGNOSIS — Z7901 Long term (current) use of anticoagulants: Secondary | ICD-10-CM | POA: Insufficient documentation

## 2022-07-11 DIAGNOSIS — I480 Paroxysmal atrial fibrillation: Secondary | ICD-10-CM | POA: Diagnosis not present

## 2022-07-11 DIAGNOSIS — F419 Anxiety disorder, unspecified: Secondary | ICD-10-CM

## 2022-07-11 DIAGNOSIS — I4891 Unspecified atrial fibrillation: Secondary | ICD-10-CM

## 2022-07-11 HISTORY — PX: CARDIOVERSION: SHX1299

## 2022-07-11 SURGERY — CARDIOVERSION
Anesthesia: General

## 2022-07-11 MED ORDER — FUROSEMIDE 10 MG/ML IJ SOLN: 80.0000 mg | Freq: Once | INTRAMUSCULAR | Status: AC

## 2022-07-11 MED ORDER — POTASSIUM CHLORIDE CRYS ER 20 MEQ PO TBCR
EXTENDED_RELEASE_TABLET | ORAL | Status: AC
  Filled 2022-07-11: qty 2

## 2022-07-11 MED ORDER — LIDOCAINE 2% (20 MG/ML) 5 ML SYRINGE
INTRAMUSCULAR | Status: DC | PRN
  Administered 2022-07-11: 60 mg via INTRAVENOUS

## 2022-07-11 MED ORDER — METOLAZONE 2.5 MG PO TABS
2.5000 mg | ORAL_TABLET | Freq: Once | ORAL | Status: AC
  Administered 2022-07-11: 2.5 mg via ORAL
  Filled 2022-07-11: qty 1

## 2022-07-11 MED ORDER — PROPOFOL 10 MG/ML IV BOLUS
INTRAVENOUS | Status: DC | PRN
  Administered 2022-07-11: 30 mg via INTRAVENOUS

## 2022-07-11 MED ORDER — POTASSIUM CHLORIDE CRYS ER 20 MEQ PO TBCR
40.0000 meq | EXTENDED_RELEASE_TABLET | Freq: Once | ORAL | Status: AC
  Administered 2022-07-11: 40 meq via ORAL

## 2022-07-11 MED ORDER — SODIUM CHLORIDE 0.9 % IV SOLN: INTRAVENOUS | Status: DC

## 2022-07-11 MED ORDER — FUROSEMIDE 10 MG/ML IJ SOLN
INTRAMUSCULAR | Status: AC
  Administered 2022-07-11: 80 mg via INTRAVENOUS
  Filled 2022-07-11: qty 8

## 2022-07-11 SURGICAL SUPPLY — 1 items: ELECT DEFIB PAD ADLT CADENCE (PAD) ×1 IMPLANT

## 2022-07-11 NOTE — CV Procedure (Signed)
    DIRECT CURRENT CARDIOVERSION  NAME:  Brenda Matthews   MRN: [id number] DOB:  August 29, 1935   ADMIT DATE: 07/11/2022   INDICATIONS: Atrial fibrillation    PROCEDURE:   Informed consent was obtained prior to the procedure. The risks, benefits and alternatives for the procedure were discussed and the patient comprehended these risks. Once an appropriate time out was taken, the patient had the defibrillator pads placed in the anterior and posterior position. The patient then underwent sedation by the anesthesia service. Once an appropriate level of sedation was achieved, the patient received a single biphasic, synchronized 200J shock with prompt conversion to sinus rhythm. There was a right AC IV infiltration which was watched and was stable.  Arvilla Meres, MD  8:47 AM

## 2022-07-11 NOTE — Anesthesia Postprocedure Evaluation (Signed)
Anesthesia Post Note  Patient: Brenda Matthews  Procedure(s) Performed: CARDIOVERSION     Patient location during evaluation: PACU Anesthesia Type: General Level of consciousness: awake and alert Pain management: pain level controlled Vital Signs Assessment: post-procedure vital signs reviewed and stable Respiratory status: spontaneous breathing, nonlabored ventilation, respiratory function stable and patient connected to nasal cannula oxygen Cardiovascular status: blood pressure returned to baseline and stable Postop Assessment: no apparent nausea or vomiting Anesthetic complications: no   No notable events documented.  Last Vitals:  Vitals:   07/11/22 1000 07/11/22 1015  BP: 139/74   Pulse: (!) 53 (!) 54  Resp: (!) 26 (!) 21  Temp:    SpO2: 91% 90%    Last Pain:  Vitals:   07/11/22 0900  TempSrc:   PainSc: 0-No pain                 Brenda Matthews

## 2022-07-11 NOTE — Transfer of Care (Signed)
Immediate Anesthesia Transfer of Care Note  Patient: Brenda Matthews  Procedure(s) Performed: CARDIOVERSION  Patient Location: PACU and Nursing Unit  Anesthesia Type:General  Level of Consciousness: drowsy  Airway & Oxygen Therapy: Patient Spontanous Breathing and Patient connected to nasal cannula oxygen  Post-op Assessment: Report given to RN and Post -op Vital signs reviewed and stable  Post vital signs: Reviewed  Last Vitals:  Vitals Value Taken Time  BP 118/72   Temp    Pulse 56   Resp 23   SpO2 100     Last Pain:  Vitals:   07/11/22 0717  TempSrc:   PainSc: 0-No pain         Complications: No notable events documented.

## 2022-07-11 NOTE — Interval H&P Note (Signed)
History and Physical Interval Note:  07/11/2022 7:41 AM  Brenda Matthews  has presented today for surgery, with the diagnosis of afib.  The various methods of treatment have been discussed with the patient and family. After consideration of risks, benefits and other options for treatment, the patient has consented to  Procedure(s): CARDIOVERSION (N/A) as a surgical intervention.  The patient's history has been reviewed, patient examined, no change in status, stable for surgery.  I have reviewed the patient's chart and labs.  Questions were answered to the patient's satisfaction.     Reyaan Thoma

## 2022-07-12 ENCOUNTER — Encounter (HOSPITAL_COMMUNITY): Payer: Self-pay | Admitting: Internal Medicine

## 2022-07-12 DIAGNOSIS — I5033 Acute on chronic diastolic (congestive) heart failure: Secondary | ICD-10-CM | POA: Diagnosis not present

## 2022-07-13 DIAGNOSIS — I5033 Acute on chronic diastolic (congestive) heart failure: Secondary | ICD-10-CM | POA: Diagnosis not present

## 2022-07-14 DIAGNOSIS — I5033 Acute on chronic diastolic (congestive) heart failure: Secondary | ICD-10-CM | POA: Diagnosis not present

## 2022-07-15 DIAGNOSIS — I5033 Acute on chronic diastolic (congestive) heart failure: Secondary | ICD-10-CM | POA: Diagnosis not present

## 2022-07-16 DIAGNOSIS — I130 Hypertensive heart and chronic kidney disease with heart failure and stage 1 through stage 4 chronic kidney disease, or unspecified chronic kidney disease: Secondary | ICD-10-CM | POA: Diagnosis not present

## 2022-07-16 DIAGNOSIS — N1832 Chronic kidney disease, stage 3b: Secondary | ICD-10-CM | POA: Diagnosis not present

## 2022-07-16 DIAGNOSIS — I5033 Acute on chronic diastolic (congestive) heart failure: Secondary | ICD-10-CM | POA: Diagnosis not present

## 2022-07-18 DIAGNOSIS — M6281 Muscle weakness (generalized): Secondary | ICD-10-CM | POA: Diagnosis not present

## 2022-07-18 DIAGNOSIS — R2689 Other abnormalities of gait and mobility: Secondary | ICD-10-CM | POA: Diagnosis not present

## 2022-07-19 DIAGNOSIS — M6281 Muscle weakness (generalized): Secondary | ICD-10-CM | POA: Diagnosis not present

## 2022-07-19 DIAGNOSIS — R2689 Other abnormalities of gait and mobility: Secondary | ICD-10-CM | POA: Diagnosis not present

## 2022-07-20 DIAGNOSIS — R2689 Other abnormalities of gait and mobility: Secondary | ICD-10-CM | POA: Diagnosis not present

## 2022-07-20 DIAGNOSIS — M6281 Muscle weakness (generalized): Secondary | ICD-10-CM | POA: Diagnosis not present

## 2022-07-21 DIAGNOSIS — R2689 Other abnormalities of gait and mobility: Secondary | ICD-10-CM | POA: Diagnosis not present

## 2022-07-21 DIAGNOSIS — M6281 Muscle weakness (generalized): Secondary | ICD-10-CM | POA: Diagnosis not present

## 2022-07-22 DIAGNOSIS — E785 Hyperlipidemia, unspecified: Secondary | ICD-10-CM | POA: Diagnosis not present

## 2022-07-22 DIAGNOSIS — I5033 Acute on chronic diastolic (congestive) heart failure: Secondary | ICD-10-CM | POA: Diagnosis not present

## 2022-07-22 DIAGNOSIS — I4891 Unspecified atrial fibrillation: Secondary | ICD-10-CM | POA: Diagnosis not present

## 2022-07-22 DIAGNOSIS — E876 Hypokalemia: Secondary | ICD-10-CM | POA: Diagnosis not present

## 2022-07-24 ENCOUNTER — Encounter: Payer: Self-pay | Admitting: Family Medicine

## 2022-07-25 ENCOUNTER — Telehealth: Payer: Self-pay | Admitting: Internal Medicine

## 2022-07-25 DIAGNOSIS — Z961 Presence of intraocular lens: Secondary | ICD-10-CM | POA: Diagnosis not present

## 2022-07-25 DIAGNOSIS — H524 Presbyopia: Secondary | ICD-10-CM | POA: Diagnosis not present

## 2022-07-25 NOTE — Telephone Encounter (Signed)
Last seen by HF clinic and meds adjusted there.   Per 07/05/22 visit with Tonye Becket, NP -- last labs in [facility]   INCREASE Lasix to 80mg  (2 tablets) by mouth 2 times daily.    INCREASE Potassium to (2 tablets) by mouth 2 times daily.    Per family member, lasix has decreased to 40mg  BID.  This apparently was done by [employer] They are concerned that patient is taking too much potassium   Advised that we do not have access to [facility]'s records/labs so cannot speak to med changes/too much K. Advised they contact the provider there who has been monitoring labs, making med changes for update.   Patient has visit on 7/18 -- plan is to obtain records/labs and bring to this appointment for review.   No further assistance needed at this time.

## 2022-07-25 NOTE — Telephone Encounter (Signed)
Pt c/o medication issue:  1. Name of Medication: Potassium and Furosemide  2. How are you currently taking this medication (dosage and times per day)?   3. Are you having a reaction (difficulty breathing--STAT)?   4. What is your medication issue? Question about the dose, she is supposed to be taking

## 2022-07-26 ENCOUNTER — Other Ambulatory Visit: Payer: Self-pay | Admitting: Family Medicine

## 2022-07-26 DIAGNOSIS — L817 Pigmented purpuric dermatosis: Secondary | ICD-10-CM | POA: Diagnosis not present

## 2022-07-26 DIAGNOSIS — L720 Epidermal cyst: Secondary | ICD-10-CM | POA: Diagnosis not present

## 2022-07-26 DIAGNOSIS — L821 Other seborrheic keratosis: Secondary | ICD-10-CM | POA: Diagnosis not present

## 2022-07-26 DIAGNOSIS — L905 Scar conditions and fibrosis of skin: Secondary | ICD-10-CM | POA: Diagnosis not present

## 2022-07-26 DIAGNOSIS — Z85820 Personal history of malignant melanoma of skin: Secondary | ICD-10-CM | POA: Diagnosis not present

## 2022-07-26 DIAGNOSIS — E876 Hypokalemia: Secondary | ICD-10-CM | POA: Diagnosis not present

## 2022-07-26 DIAGNOSIS — E878 Other disorders of electrolyte and fluid balance, not elsewhere classified: Secondary | ICD-10-CM | POA: Diagnosis not present

## 2022-07-26 DIAGNOSIS — D692 Other nonthrombocytopenic purpura: Secondary | ICD-10-CM | POA: Diagnosis not present

## 2022-07-28 NOTE — Progress Notes (Signed)
Cardiology Clinic Note   Patient Name: Brenda Matthews Date of Encounter: 08/04/2022  Primary Care Provider:  Jeoffrey Massed, MD Primary Cardiologist:  Chrystie Nose, MD  Patient Profile    Brenda Matthews 87 year old female presents to the clinic today for follow-up evaluation of her essential hypertension, acute on chronic diastolic CHF, and paroxysmal atrial fibrillation.  Past Medical History    Past Medical History:  Diagnosis Date   Abnormal EKG approx 2008   Nuclear stress test neg;    Acute pancreatitis 09/2020   idiopathic.  +panc psudocyst   Anxiety    with panic   CAP (community acquired pneumonia) 08/2017   Hospitalization for CAP/acute diast HF/rapid a-fib   Cataract    s/p surgery--lens implants   Chronic diastolic heart failure (HCC) 2019   Chronic renal insufficiency, stage 3 (moderate) (HCC) 12/04/2012   Renal u/s when in hosp 08/2017 for CAP/CHF showed symmetric kidneys, echogenicity normal, w/out hydronephrosis. Baseline GFR around 40 ml/min as of 10/2018.   DDD (degenerative disc disease), lumbar    Diverticulosis 2009   Fracture of radial shaft, left, closed 11/16/2010   fell down flight of stairs   History of kidney stones    Hx of adenomatous colonic polyps 2002;2009;2015   surveillance colonoscopy 2009, +polypectomy done-tubular adenoma w/out high grade.  05/2013 tubular adenomas--recall 3 yrs   Hyperlipidemia    Hypertension    Low TSH level 02/18/2016   T3 norm, T4 mildly elevated--suspected sick euthyroid syndrome.  Repeat labs 06/2016: normal.   Melanoma in situ (HCC) 06/2018   L LL   Osteoarthritis of both knees    viscosupplementation injections helpful 2020/21   Osteopenia    DEXA 08/2010; repeat DEXA 02/2015 worse: fosamax started.  06/2018 Dexa T score -2.4.  2020 maj osteop fx risk = 24%, Hip fx risk 7.3%. 09/2020 T score -2.1   PAF (paroxysmal atrial fibrillation) (HCC) 02/2016   2018.   DCCV x 2 2024.   Peripheral edema    Pneumonia  2015   hx with sepsis   Rheumatic fever    Past Surgical History:  Procedure Laterality Date   APPENDECTOMY  1966   done during surgery for tubal pregnancy   CARDIOVERSION N/A 06/17/2022   Procedure: CARDIOVERSION;  Surgeon: Jodelle Red, MD;  Location: [facility] INVASIVE CV LAB;  Service: Cardiovascular;  Laterality: N/A;   CARDIOVERSION N/A 07/11/2022   Procedure: CARDIOVERSION;  Surgeon: Dolores Patty, MD;  Location: [facility] INVASIVE CV LAB;  Service: Cardiovascular;  Laterality: N/A;   CATARACT EXTRACTION W/ INTRAOCULAR LENS IMPLANT  2013   bilat   COLONOSCOPY W/ POLYPECTOMY  05/2013   +diverticulosis; recall 3 yrs (Dr. Marina Goodell)   DEXA  02/2015; 06/2018   T score -2.1 in both femoral necks; FRAX 10 yr risk of major osteoporotic fracture was 21%---fosamax started. 06/2018 T score -2.4.  T score 09/2020 -2.1. Rpt 2 yrs.   ECTOPIC PREGNANCY SURGERY     EYE SURGERY     LEFT HEART CATH AND CORONARY ANGIOGRAPHY N/A 09/01/2017   No angiographically significant CAD.  Upper normal left ventricular filling pressure.  Procedure: LEFT HEART CATH AND CORONARY ANGIOGRAPHY;  Surgeon: Yvonne Kendall, MD;  Location: [facility] INVASIVE CV LAB;  Service: Cardiovascular;  Laterality: N/A;   LUMBAR LAMINECTOMY/DECOMPRESSION MICRODISCECTOMY N/A 09/26/2018   Procedure: Decompressive Lumbar Laminectomy L5 S1 FORAMINOTOMY L5 S1  NERVE ROOT BILATERALLY and Microdiscectomy L5-S1 Left;  Surgeon: Ranee Gosselin, MD;  Location: [facility] ORS;  Service: Orthopedics;  Laterality: N/A;    OPEN REDUCTION INTERNAL FIXATION (ORIF) TIBIA/FIBULA FRACTURE Left 02/18/2016   Procedure: OPEN REDUCTION INTERNAL FIXATION (ORIF) Right ankle trimalleolar fracture;  Surgeon: Toni Arthurs, MD;  Location: [facility] OR;  Service: Orthopedics;  Laterality: Left;  requests   ORIF RADIAL FRACTURE  11/18/2010   left; s/p slip on slippery floor and fell   THORACENTESIS  08/2017   diagnostic and therapeutic.  Transudative.  Clx neg.  (+pulm  edema/diastolic HF)   TONSILLECTOMY     TRANSTHORACIC ECHOCARDIOGRAM  02/18/2016; 08/10/17   LVEF of 55-60%, mild AI and mild MR and normal biatrial size.  07/2017--normal LV function, mild enlarge aortic root, mild/mod TR, bilat atrial enlargement. 06/09/22 EF 50-55%, mod MR, aortic root stable enlgmt    Allergies  Allergies  Allergen Reactions   Augmentin [Amoxicillin-Pot Clavulanate] Nausea And Vomiting and Other (See Comments)    "projectile vomiting" Has patient had a PCN reaction causing immediate rash, facial/tongue/throat swelling, SOB or lightheadedness with hypotension:No Has patient had a PCN reaction causing severe rash involving mucus membranes or skin necrosis:No Has patient had a PCN reaction that required hospitalization:No Has patient had a PCN reaction occurring within the last 10 years:Yes If all of the above answers are "NO", then may proceed with Cephalosporin use.    Amoxicillin Rash   Clindamycin/Lincomycin Rash    History of Present Illness    Brenda Matthews has a PMH of HTN, PAF, chronic diastolic CHF, hyperlipidemia, anemia, anxiety, insomnia, DOE, status post thoracentesis, and edema.  She underwent cardiac catheterization 8/19 which showed normal coronaries.  She was seen by Dr. Rennis Golden 2/18 for new onset atrial fibrillation with RVR after induction with anesthesia for ankle fracture.  Her echocardiogram showed normal LV function.  She was initially placed on low-dose beta-blocker and Xarelto.  She was taken off beta-blocker due to low blood pressure and converted back to sinus rhythm.  CHA2DS2-VASc score 3.  She was advised to continue Xarelto.  She had a recurrent episode of atrial fibrillation with RVR 7/19.  Her echocardiogram at that time showed an LV function of 50-55%, mild MR, mild-moderate TR.  She was started on amiodarone and outpatient DCCV was scheduled.  On presentation for DCCV she was found to be in sinus rhythm.  She was seen in follow-up by Dr.  Rennis Golden on 5/21.  During that time she continued to do well from cardiac standpoint.  Her amiodarone was discontinued.  She was seen in follow-up by Marjie Skiff, PA-C on 09/24/2020.  At that time she reported that she had been admitted 09/14/2020 until 09/19/2020 with an acute episode of pancreatitis and pancreatic pseudocyst.  GI was consulted and felt conservative management was her best option.  She was also treated for acute UTI.  She had no cardiac issues during her admission.  She was discharged to [facility].  On follow-up she continues to do well from a cardiac standpoint.  She reported that during her hospitalization she received IV fluids and had significant lower extremity swelling.  Her left leg was noted to be chronically larger than her right.  She was elevating her legs and using compression stockings.  She noted occasional shortness of breath that would improve with inhaler use.  She denied orthopnea and PND.  She occasionally noted palpitations which would last for 2-3 minutes and resolved.  She denied lightheadedness, dizziness and syncope.  She presented to the clinic 09/06/21 for follow-up evaluation stated she feels well  today.  She occasionally noticed episodes of dizziness.  They dissipated without intervention.  She also noticed some mild increased work of breathing with walking long distances.  She did not notice increased shortness of breath with normal activities.  She had been going to the gym 3 times per week at [facility].  She has noticed some loose stools and will be following up with GI related to her pancreatitis.  Her ekg showed normal sinus rhythm 62 bpm.  I planned follow-up in 6 months.  She has a son that lives in [place].  She was seen in follow-up by Wallis Bamberg, NP-C 04/07/2022.  She was noted to be fluid volume overloaded.  Her furosemide was increased x 2 days.  Her lab work remained stable.  Follow-up was planned for 2 weeks.  She presented to the clinic  04/28/22 for follow-up evaluation and stated she was concerned about her weight that morning.  She had been weighing herself at home and reported that her weight had been stable.  She was doing physical therapy.  She denied shortness of breath at rest.  She did note dyspnea on exertion.  We reviewed her recent lab work and she expressed understanding.  We reviewed the importance of avoiding salt and fluid restriction.  She had an upcoming echocardiogram.    I doubled her furosemide x 3 days as well as her potassium for 3 days.  I will ordered a BMP in 1 week and planned follow-up in 1 month.  She was admitted to the hospital on 06/11/2022 with progressive shortness of breath.  She received IV diuresis.  She was discharged in stable condition on 06/24/2022.  She was seen in follow-up by Tonye Becket, NP on 07/05/2022.  She reported fatigue and shortness of breath with exertion.  Current weight was noted to be 176 pounds.  Her EKG at that time showed atrial fibrillation with rate of 105 bpm.  Her amiodarone was increased to 400 mg twice daily and her apixaban was continued.  07/11/2022 she underwent successful DCCV with biphasic shock at 200 J.  She presents to the clinic today for follow-up evaluation and states she is feeling much better.  She reports that she did have some dietary indiscretion.  We reviewed her weight today which is 175.8 pounds.  This is stable.  Her recent lab work from rehab facility shows an elevated creatinine of 1.71.  We reviewed her medications.  She has been taking them differently.  She has been taking Lasix 20 mg daily and potassium 20 mill equivalents twice daily.  I will decrease her amiodarone to 200 mg daily.  I will repeat a BMP today and in 1 week.  We discussed the importance of continuing low-sodium diet and fluid restriction.  She expressed understanding.  We will plan follow-up in 1 to 2 months.  Based off of her lab work I plan to add SLG 2 inhibitor Farxiga 10 mg  daily)   Today she denies chest pain,  lower extremity edema, fatigue, palpitations, melena, hematuria, hemoptysis, diaphoresis, weakness, presyncope, syncope, orthopnea, and PND.     Home Medications    Prior to Admission medications   Medication Sig Start Date End Date Taking? Authorizing Provider  acetaminophen (TYLENOL) 650 MG CR tablet Take 650 mg by mouth in the morning and at bedtime. Patient not taking: Reported on 05/06/2021    [provider]  albuterol (VENTOLIN HFA) 108 (90 Base) MCG/ACT inhaler INHALE 2 PUFFS BY MOUTH into THE  lungs EVERY 4 HOURS AS NEEDED FOR WHEEZING OR SHORTNESS OF BREATH 08/25/21   McGowen, Maryjean Morn, MD  ALPRAZolam Prudy Feeler) 0.5 MG tablet 1 tab po tid prn anxiety 04/05/21   McGowen, Maryjean Morn, MD  amLODipine (NORVASC) 10 MG tablet TAKE 1 TABLET BY MOUTH EVERY DAY 07/27/21   McGowen, Maryjean Morn, MD  Apoaequorin (PREVAGEN PO) Take 1 tablet by mouth daily.    [provider]  Ascorbic Acid (VITAMIN C PO) Take by mouth daily.    [provider]  atorvastatin (LIPITOR) 40 MG tablet TAKE 1 TABLET BY MOUTH EVERY DAY 07/27/21   McGowen, Maryjean Morn, MD  Biotin 5000 MCG TABS Take 5,000 mcg by mouth daily.    [provider]  Calcium Carbonate (CALCIUM 600 PO) Take 1,200 mg by mouth daily.     [provider]  carboxymethylcellulose (REFRESH PLUS) 0.5 % SOLN Place 2 drops into both eyes daily as needed (dry/irritated eyes.). Patient not taking: Reported on 05/06/2021    [provider]  Coenzyme Q10 200 MG capsule Take 200 mg by mouth daily.    [provider]  fluticasone (FLONASE) 50 MCG/ACT nasal spray Place 2 sprays into both nostrils daily.     [provider]  furosemide (LASIX) 40 MG tablet Take 1 tablet (40 mg total) by mouth daily. 08/27/20   McGowen, Maryjean Morn, MD  hydrALAZINE (APRESOLINE) 50 MG tablet TAKE 1 TABLET BY MOUTH 3 TIMES DAILY 07/27/21   McGowen, Maryjean Morn, MD  metoprolol tartrate  (LOPRESSOR) 100 MG tablet TAKE 1 TABLET BY MOUTH 2 TIMES DAILY 04/28/21   Hilty, Lisette Abu, MD  Multiple Vitamins-Minerals (CENTRUM SILVER ULTRA WOMENS) TABS Take 1 tablet by mouth every evening.    [provider]  potassium chloride SA (KLOR-CON) 20 MEQ tablet TAKE 1 TABLET BY MOUTH 2 TIMES DAILY 10/14/20   Hilty, Lisette Abu, MD  Rivaroxaban (XARELTO) 15 MG TABS tablet TAKE 1 TABLET BY MOUTH EVERY DAY AT [facility] 08/03/21   Hilty, Lisette Abu, MD    Family History    Family History  Problem Relation Age of Onset   Heart disease Mother    Heart disease Father    Hypertension Brother    Diabetes Sister    Colon cancer Neg Hx    Pancreatic cancer Neg Hx    Rectal cancer Neg Hx    Stomach cancer Neg Hx    She indicated that her mother is deceased. She indicated that her father is deceased. She indicated that her sister is deceased. She indicated that the status of her brother is unknown. She indicated that the status of her neg hx is unknown.  Social History    Social History   Socioeconomic History   Marital status: Widowed    Spouse name: Not on file   Number of children: Not on file   Years of education: Not on file   Highest education level: Not on file  Occupational History   Not on file  Tobacco Use   Smoking status: Never   Smokeless tobacco: Never  Vaping Use   Vaping status: Never Used  Substance and Sexual Activity   Alcohol use: Yes    Comment: rarely   Drug use: Never   Sexual activity: Not on file  Other Topics Concern   Not on file  Social History Narrative   Widow, 2 sons.   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   No tobacco.  Rare alcohol.   No drugs.  Exercise: 4 times per week, about 4mi.   Social Determinants of Health   Financial Resource Strain: Low Risk  (10/27/2021)   Overall Financial Resource Strain (CARDIA)    Difficulty of Paying Living Expenses: Not hard at all  Food Insecurity: No Food Insecurity (06/11/2022)   Hunger Vital Sign    Worried  About Running Out of Food in the Last Year: Never true    Ran Out of Food in the Last Year: Never true  Transportation Needs: No Transportation Needs (06/11/2022)   PRAPARE - Administrator, Civil Service (Medical): No    Lack of Transportation (Non-Medical): No  Physical Activity: Sufficiently Active (10/27/2021)   Exercise Vital Sign    Days of Exercise per Week: 5 days    Minutes of Exercise per Session: 30 min  Stress: No Stress Concern Present (10/27/2021)   [employer] of Occupational Health - Occupational Stress Questionnaire    Feeling of Stress : Not at all  Social Connections: Moderately Integrated (10/27/2021)   Social Connection and Isolation Panel [NHANES]    Frequency of Communication with Friends and Family: More than three times a week    Frequency of Social Gatherings with Friends and Family: More than three times a week    Attends Religious Services: More than 4 times per year    Active Member of [employer] or Organizations: Yes    Attends Banker Meetings: More than 4 times per year    Marital Status: Widowed  Intimate Partner Violence: Not At Risk (06/11/2022)   Humiliation, Afraid, Rape, and Kick questionnaire    Fear of Current or Ex-Partner: No    Emotionally Abused: No    Physically Abused: No    Sexually Abused: No     Review of Systems    General:  No chills, fever, night sweats or weight changes.  Cardiovascular:  No chest pain, dyspnea on exertion, edema, orthopnea, palpitations, paroxysmal nocturnal dyspnea. Dermatological: No rash, lesions/masses Respiratory: No cough, dyspnea Urologic: No hematuria, dysuria Abdominal:   No nausea, vomiting, diarrhea, bright red blood per rectum, melena, or hematemesis Neurologic:  No visual changes, wkns, changes in mental status. All other systems reviewed and are otherwise negative except as noted above.  Physical Exam    VS:  BP (!) 144/70 (BP Location: Left Arm, Patient Position:  Sitting, Cuff Size: Normal)   Pulse (!) 58   Ht 5' 1.5" (1.562 m)   Wt 175 lb 12.8 oz (79.7 kg)   LMP  (LMP Unknown)   SpO2 93%   BMI 32.68 kg/m  , BMI Body mass index is 32.68 kg/m. GEN: Well nourished, well developed, in no acute distress. HEENT: normal. Neck: Supple, no JVD, carotid bruits, or masses. Cardiac: RRR, no murmurs, rubs, or gallops. No clubbing, cyanosis, no edema.  Radials/DP/PT 2+ and equal bilaterally.  Respiratory:  Respirations regular and unlabored, clear to auscultation bilaterally. GI: Soft, nontender, nondistended, BS + x 4. MS: no deformity or atrophy. Skin: warm and dry, no rash. Neuro:  Strength and sensation are intact. Psych: Normal affect.  Accessory Clinical Findings    Recent Labs: 06/11/2022: ALT 30 06/24/2022: Magnesium 2.2 07/05/2022: B Natriuretic Peptide 1,272.1; BUN 22; Creatinine, Ser 1.28; Hemoglobin 12.8; Platelets 189; Potassium 4.0; Sodium 139   Recent Lipid Panel    Component Value Date/Time   CHOL 144 01/05/2022 1001   TRIG 113.0 01/05/2022 1001   HDL 60.90 01/05/2022 1001   CHOLHDL 2 01/05/2022 1001   VLDL  22.6 01/05/2022 1001   LDLCALC 61 01/05/2022 1001   LDLDIRECT 66.0 09/10/2018 0913    ECG personally reviewed by me today-EKG Interpretation Date/Time:  Thursday August 04 2022 15:44:49 EDT Ventricular Rate:  59 PR Interval:  234 QRS Duration:  98 QT Interval:  496 QTC Calculation: 491 R Axis:   -33  Text Interpretation: Sinus bradycardia with 1st degree A-V block Left axis deviation Confirmed by Edd Fabian [phone]) on 08/04/2022 3:48:52 PM    EKG 07/11/22 sinus rhythm anterior infarct undetermined age 65 bpm.  EKG 09/06/2021 normal sinus rhythm left axis deviation anterior infarct undetermined age 16 bpm- No acute changes  Echocardiogram 08/10/2017  Study Conclusions   - Left ventricle: The cavity size was normal. Wall thickness was    increased in a pattern of mild LVH. Systolic function was normal.    The  estimated ejection fraction was in the range of 50% to 55%.    Wall motion was normal; there were no regional wall motion    abnormalities.  - Aortic valve: There was trivial regurgitation.  - Aortic root: The aortic root was mildly dilated.  - Mitral valve: There was mild regurgitation.  - Left atrium: The atrium was severely dilated.  - Right atrium: The atrium was mildly dilated.  - Tricuspid valve: There was mild-moderate regurgitation.  - Pulmonary arteries: PA peak pressure: 32 mm Hg (S).  - Pericardium, extracardiac: A trivial pericardial effusion was    identified.   Impressions:   - Normal LV systolic function; mild LVH; trace AI; mildly dilated    aortic root; mild MR; biatrial enlargement; mild to moderate TR.  Echocardiogram 06/09/2022  IMPRESSIONS     1. Left ventricular ejection fraction, by estimation, is 50 to 55%. The  left ventricle has low normal function. Left ventricular endocardial  border not optimally defined to evaluate regional wall motion. There is  mild concentric left ventricular  hypertrophy. Left ventricular diastolic parameters are indeterminate.   2. Right ventricular systolic function is normal. The right ventricular  size is normal.   3. Left atrial size was moderately dilated.   4. Right atrial size was mildly dilated.   5. The mitral valve is grossly normal. Mild to moderate mitral valve  regurgitation.   6. The aortic valve is tricuspid. There is mild calcification of the  aortic valve. There is mild thickening of the aortic valve. Aortic valve  regurgitation is mild. Aortic valve sclerosis/calcification is present,  without any evidence of aortic  stenosis.   7. Aortic dilatation noted. There is mild dilatation of the ascending  aorta, measuring 41 mm.   Comparison(s): No significant change from prior study.   FINDINGS   Left Ventricle: Left ventricular ejection fraction, by estimation, is 50  to 55%. The left ventricle has low  normal function. Left ventricular  endocardial border not optimally defined to evaluate regional wall motion.  The left ventricular internal cavity   size was normal in size. There is mild concentric left ventricular  hypertrophy. Left ventricular diastolic parameters are indeterminate.   Right Ventricle: The right ventricular size is normal. No increase in  right ventricular wall thickness. Right ventricular systolic function is  normal.   Left Atrium: Left atrial size was moderately dilated.   Right Atrium: Right atrial size was mildly dilated.   Pericardium: There is no evidence of pericardial effusion.   Mitral Valve: The mitral valve is grossly normal. Mild to moderate mitral  valve regurgitation.   Tricuspid Valve:  The tricuspid valve is normal in structure. Tricuspid  valve regurgitation is mild.   Aortic Valve: The aortic valve is tricuspid. There is mild calcification  of the aortic valve. There is mild thickening of the aortic valve. Aortic  valve regurgitation is mild. Aortic valve sclerosis/calcification is  present, without any evidence of  aortic stenosis.   Pulmonic Valve: The pulmonic valve was normal in structure. Pulmonic valve  regurgitation is trivial.   Aorta: Aortic dilatation noted. There is mild dilatation of the ascending  aorta, measuring 41 mm.   IAS/Shunts: The atrial septum is grossly normal.   Assessment & Plan   Paroxysmal atrial fibrillation-heart rate today 58 .  Underwent successful DCCV 07/11/2022.  Beta-blocker previously stopped due to low blood pressure chest.  CHA2DS2-VASc score 5.  Denies bleeding issues. Avoid triggers Continue Eliquis, metoprolol Reduce amiodarone to 200 mg daily. Heart healthy low-sodium diet  Chronic diastolic CHF-breathing stable.  Weight today 175.8. Generalized bilateral lower extremity nonpitting ankle edema.  Left leg chronically larger than right.  Renal function noted to be 1.71 on 07/26/2022 Continue  hydralazine, metoprolol   Taking furosemide 20mg  daily  Reduce potassium 20 meq daily- was taking 20 mill equivalents twice daily Plan to start sglt2 if able based on BMP Heart healthy low-sodium diet Increase physical activity as tolerated-continues to do group exercise at [employer]. Daily weights Continue lower extremity support stockings Fluid restriction less than 50 ounces daily BMP today and in 1-2 weeks   Hypertension-BP today 144/70.   Continue metoprolol, furosemide.   Heart healthy low-sodium diet Maintain blood pressure log  CKD stage III-creatinine 1.71 on 07/26/22. Follows with PCP  Disposition: Follow-up with Dr. Rennis Golden or APP in 1-2 months.  Thomasene Ripple. Destanie Tibbetts NP-C     08/04/2022, 3:50 PM [facility] [address] Office [phone] Fax [phone]  Notice: This dictation was prepared with Dragon dictation along with smaller phrase technology. Any transcriptional errors that result from this process are unintentional and may not be corrected upon review.  I spent 14 minutes examining this patient, reviewing medications, and using patient centered shared decision making involving her cardiac care.  Prior to her visit I spent greater than 20 minutes reviewing her past medical history,  medications, and prior cardiac tests.

## 2022-08-03 ENCOUNTER — Ambulatory Visit: Payer: Medicare PPO | Admitting: Family Medicine

## 2022-08-03 NOTE — Progress Notes (Deleted)
OFFICE VISIT  08/03/2022  CC: No chief complaint on file.   Patient is a 87 y.o. female who presents for follow-up status post rehab stay after hospitalization for heart failure and rapid A-fib from 06/11/2022 to 06/24/2022. She was discharged to [facility] skilled nursing facility.***  HPI: She presented in heart failure and rapid A-fib.  She underwent DCCV on 06/17/2022 but unfortunately did not remain in sinus rhythm.  She was discharged home on amiodarone 200 mg twice daily, metoprolol 100 mg twice daily, and Eliquis 5 mg twice daily. She had acute on chronic renal failure.  This improved in hospital.  She had pulmonary edema on admission, echo showed EF 50 to 55% with moderate MR, mild AR, no aortic stenosis.  She had mild aortic dilation to 41 mm. She was aggressively diuresed and transitioned back to 40 mg Lasix daily.  INTERIM HX: *** DCCV again 07/11/22: ***  Past Medical History:  Diagnosis Date   Abnormal EKG approx 2008   Nuclear stress test neg;    Acute pancreatitis 09/2020   idiopathic.  +panc psudocyst   Anxiety    with panic   CAP (community acquired pneumonia) 08/2017   Hospitalization for CAP/acute diast HF/rapid a-fib   Cataract    s/p surgery--lens implants   Chronic diastolic heart failure (HCC) 2019   Chronic renal insufficiency, stage 3 (moderate) (HCC) 12/04/2012   Renal u/s when in hosp 08/2017 for CAP/CHF showed symmetric kidneys, echogenicity normal, w/out hydronephrosis. Baseline GFR around 40 ml/min as of 10/2018.   DDD (degenerative disc disease), lumbar    Diverticulosis 2009   Fracture of radial shaft, left, closed 11/16/2010   fell down flight of stairs   History of kidney stones    Hx of adenomatous colonic polyps 2002;2009;2015   surveillance colonoscopy 2009, +polypectomy done-tubular adenoma w/out high grade.  05/2013 tubular adenomas--recall 3 yrs   Hyperlipidemia    Hypertension    Low TSH level 02/18/2016   T3 norm, T4 mildly  elevated--suspected sick euthyroid syndrome.  Repeat labs 06/2016: normal.   Melanoma in situ (HCC) 06/2018   L LL   Osteoarthritis of both knees    viscosupplementation injections helpful 2020/21   Osteopenia    DEXA 08/2010; repeat DEXA 02/2015 worse: fosamax started.  06/2018 Dexa T score -2.4.  2020 maj osteop fx risk = 24%, Hip fx risk 7.3%. 09/2020 T score -2.1   PAF (paroxysmal atrial fibrillation) (HCC) 02/2016   2018.   DCCV x 2 2024.   Peripheral edema    Pneumonia 2015   hx with sepsis   Rheumatic fever     Past Surgical History:  Procedure Laterality Date   APPENDECTOMY  1966   done during surgery for tubal pregnancy   CARDIOVERSION N/A 06/17/2022   Procedure: CARDIOVERSION;  Surgeon: Jodelle Red, MD;  Location: [facility] INVASIVE CV LAB;  Service: Cardiovascular;  Laterality: N/A;   CARDIOVERSION N/A 07/11/2022   Procedure: CARDIOVERSION;  Surgeon: Dolores Patty, MD;  Location: [facility] INVASIVE CV LAB;  Service: Cardiovascular;  Laterality: N/A;   CATARACT EXTRACTION W/ INTRAOCULAR LENS IMPLANT  2013   bilat   COLONOSCOPY W/ POLYPECTOMY  05/2013   +diverticulosis; recall 3 yrs (Dr. Marina Goodell)   DEXA  02/2015; 06/2018   T score -2.1 in both femoral necks; FRAX 10 yr risk of major osteoporotic fracture was 21%---fosamax started. 06/2018 T score -2.4.  T score 09/2020 -2.1. Rpt 2 yrs.   ECTOPIC PREGNANCY SURGERY     EYE  SURGERY     LEFT HEART CATH AND CORONARY ANGIOGRAPHY N/A 09/01/2017   No angiographically significant CAD.  Upper normal left ventricular filling pressure.  Procedure: LEFT HEART CATH AND CORONARY ANGIOGRAPHY;  Surgeon: Yvonne Kendall, MD;  Location: [facility] INVASIVE CV LAB;  Service: Cardiovascular;  Laterality: N/A;   LUMBAR LAMINECTOMY/DECOMPRESSION MICRODISCECTOMY N/A 09/26/2018   Procedure: Decompressive Lumbar Laminectomy L5 S1 FORAMINOTOMY L5 S1  NERVE ROOT BILATERALLY and Microdiscectomy L5-S1 Left;  Surgeon: Ranee Gosselin, MD;  Location: [facility] ORS;  Service:  Orthopedics;  Laterality: N/A;    OPEN REDUCTION INTERNAL FIXATION (ORIF) TIBIA/FIBULA FRACTURE Left 02/18/2016   Procedure: OPEN REDUCTION INTERNAL FIXATION (ORIF) Right ankle trimalleolar fracture;  Surgeon: Toni Arthurs, MD;  Location: [facility] OR;  Service: Orthopedics;  Laterality: Left;  requests   ORIF RADIAL FRACTURE  11/18/2010   left; s/p slip on slippery floor and fell   THORACENTESIS  08/2017   diagnostic and therapeutic.  Transudative.  Clx neg.  (+pulm edema/diastolic HF)   TONSILLECTOMY     TRANSTHORACIC ECHOCARDIOGRAM  02/18/2016; 08/10/17   LVEF of 55-60%, mild AI and mild MR and normal biatrial size.  07/2017--normal LV function, mild enlarge aortic root, mild/mod TR, bilat atrial enlargement. 06/09/22 EF 50-55%, mod MR, aortic root stable enlgmt    Outpatient Medications Prior to Visit  Medication Sig Dispense Refill   acetaminophen (TYLENOL) 650 MG CR tablet Take 1,300 mg by mouth daily.     albuterol (VENTOLIN HFA) 108 (90 Base) MCG/ACT inhaler INHALE 2 PUFFS BY MOUTH into THE lungs EVERY 4 HOURS AS NEEDED FOR WHEEZING OR SHORTNESS OF BREATH (Patient taking differently: Inhale 2 puffs into the lungs See admin instructions. INHALE 2 PUFFS BY MOUTH into THE lungs EVERY 4 HOURS AS NEEDED FOR WHEEZING OR SHORTNESS OF BREATH) 18 g 1   ALPRAZolam (XANAX) 0.5 MG tablet Take 1 tablet (0.5 mg total) by mouth 3 (three) times daily as needed for anxiety. TAKE 1 TABLET BY MOUTH 3 TIMES DAILY AS NEEDED FOR ANXIETY 30 tablet 0   amiodarone (PACERONE) 400 MG tablet Take 1 tablet (400 mg total) by mouth 2 (two) times daily. 60 tablet 2   apixaban (ELIQUIS) 5 MG TABS tablet Take 1 tablet (5 mg total) by mouth 2 (two) times daily. 180 tablet 3   Apoaequorin (PREVAGEN PO) Take 1 tablet by mouth daily.     Ascorbic Acid (VITAMIN C PO) Take by mouth daily.     atorvastatin (LIPITOR) 40 MG tablet TAKE 1 TABLET BY MOUTH EVERY DAY 90 tablet 1   Biotin 5000 MCG TABS Take 5,000 mcg by mouth  daily.     Calcium Carbonate (CALCIUM 600 PO) Take 1,200 mg by mouth daily.      carboxymethylcellulose (REFRESH PLUS) 0.5 % SOLN Place 2 drops into both eyes daily as needed (dry/irritated eyes.).     Coenzyme Q10 200 MG capsule Take 200 mg by mouth daily.     fluticasone (FLONASE) 50 MCG/ACT nasal spray Place 2 sprays into both nostrils daily.      furosemide (LASIX) 40 MG tablet Take 2 tablets (80 mg total) by mouth daily. 60 tablet 2   hydrocortisone cream 1 % Apply 1 Application topically as needed for itching.     metoprolol tartrate (LOPRESSOR) 100 MG tablet Take 1 tablet (100 mg total) by mouth 2 (two) times daily. 180 tablet 2   Multiple Vitamins-Minerals (CENTRUM SILVER ULTRA WOMENS) TABS Take 1 tablet by mouth every evening.  potassium chloride SA (KLOR-CON M) 20 MEQ tablet Take 2 tablets (40 mEq total) by mouth 2 (two) times daily. 120 tablet 2   No facility-administered medications prior to visit.    Allergies  Allergen Reactions   Augmentin [Amoxicillin-Pot Clavulanate] Nausea And Vomiting and Other (See Comments)    "projectile vomiting" Has patient had a PCN reaction causing immediate rash, facial/tongue/throat swelling, SOB or lightheadedness with hypotension:No Has patient had a PCN reaction causing severe rash involving mucus membranes or skin necrosis:No Has patient had a PCN reaction that required hospitalization:No Has patient had a PCN reaction occurring within the last 10 years:Yes If all of the above answers are "NO", then may proceed with Cephalosporin use.    Amoxicillin Rash   Clindamycin/Lincomycin Rash    Review of Systems As per HPI  PE:    07/11/2022   10:15 AM 07/11/2022   10:00 AM 07/11/2022    9:45 AM  Vitals with BMI  Systolic  139 134  Diastolic  74 66  Pulse 54 53 52     Physical Exam  ***  LABS:  Last CBC Lab Results  Component Value Date   WBC 8.3 07/05/2022   HGB 12.8 07/05/2022   HCT 40.8 07/05/2022   MCV 90.7 07/05/2022    MCH 28.4 07/05/2022   RDW 14.8 07/05/2022   PLT 189 07/05/2022   Last metabolic panel Lab Results  Component Value Date   GLUCOSE 129 (H) 07/05/2022   NA 139 07/05/2022   K 4.0 07/05/2022   CL 105 07/05/2022   CO2 25 07/05/2022   BUN 22 07/05/2022   CREATININE 1.28 (H) 07/05/2022   GFRNONAA 41 (L) 07/05/2022   CALCIUM 8.7 (L) 07/05/2022   PHOS 2.3 (L) 09/18/2020   PROT 6.5 06/11/2022   ALBUMIN 3.5 06/11/2022   LABGLOB 2.4 09/24/2020   AGRATIO 1.5 09/24/2020   BILITOT 0.8 06/11/2022   ALKPHOS 84 06/11/2022   AST 33 06/11/2022   ALT 30 06/11/2022   ANIONGAP 9 07/05/2022   Last lipids Lab Results  Component Value Date   CHOL 144 01/05/2022   HDL 60.90 01/05/2022   LDLCALC 61 01/05/2022   LDLDIRECT 66.0 09/10/2018   TRIG 113.0 01/05/2022   CHOLHDL 2 01/05/2022   Last thyroid functions Lab Results  Component Value Date   TSH 0.68 10/22/2018   T3TOTAL 115.0 07/04/2016   Last vitamin D Lab Results  Component Value Date   VD25OH 37 05/25/2010   IMPRESSION AND PLAN:  No problem-specific Assessment & Plan notes found for this encounter.   An After Visit Summary was printed and given to the patient.  FOLLOW UP: No follow-ups on file.  Signed:  Santiago Bumpers, MD           08/03/2022

## 2022-08-04 ENCOUNTER — Encounter: Payer: Self-pay | Admitting: General Practice

## 2022-08-04 ENCOUNTER — Other Ambulatory Visit: Payer: Self-pay | Admitting: Family Medicine

## 2022-08-04 ENCOUNTER — Ambulatory Visit: Payer: Medicare PPO | Attending: General Practice | Admitting: General Practice

## 2022-08-04 VITALS — BP 144/70 | HR 58 | Ht 61.5 in | Wt 175.8 lb

## 2022-08-04 DIAGNOSIS — I10 Essential (primary) hypertension: Secondary | ICD-10-CM

## 2022-08-04 DIAGNOSIS — N1832 Chronic kidney disease, stage 3b: Secondary | ICD-10-CM

## 2022-08-04 DIAGNOSIS — I480 Paroxysmal atrial fibrillation: Secondary | ICD-10-CM

## 2022-08-04 DIAGNOSIS — I5032 Chronic diastolic (congestive) heart failure: Secondary | ICD-10-CM

## 2022-08-04 MED ORDER — POTASSIUM CHLORIDE CRYS ER 20 MEQ PO TBCR: 20.0000 meq | EXTENDED_RELEASE_TABLET | Freq: Every day | ORAL | 3 refills | Status: DC

## 2022-08-04 MED ORDER — FUROSEMIDE 20 MG PO TABS: 20.0000 mg | ORAL_TABLET | Freq: Every day | ORAL | 3 refills | Status: DC

## 2022-08-04 MED ORDER — AMIODARONE HCL 200 MG PO TABS: 200.0000 mg | ORAL_TABLET | Freq: Every day | ORAL | 3 refills | Status: DC

## 2022-08-04 NOTE — Patient Instructions (Signed)
Medication Instructions:  Furosemide (Lasix) 20 mg every morning (one tablet) Potassium Chloride 20 meq every morning (one tablet) Amiodarone 200 mg daily (one tablet) *If you need a refill on your cardiac medications before your next appointment, please call your pharmacy*   Lab Work: BMP- today BMP- Please return for Blood Work in 1 week. No appointment needed, lab here at the office is open Monday-Friday from 8AM to 4PM and closed daily for lunch from 12:45-1:45.   If you have labs (blood work) drawn today and your tests are completely normal, you will receive your results only by: MyChart Message (if you have MyChart) OR A paper copy in the mail If you have any lab test that is abnormal or we need to change your treatment, we will call you to review the results.  Follow-Up: At [facility], you and your health needs are our priority.  As part of our continuing mission to provide you with exceptional heart care, we have created designated Provider Care Teams.  These Care Teams include your primary Cardiologist (physician) and Advanced Practice Providers (APPs -  Physician Assistants and Nurse Practitioners) who all work together to provide you with the care you need, when you need it.  We recommend signing up for the patient portal called "MyChart".  Sign up information is provided on this After Visit Summary.  MyChart is used to connect with patients for Virtual Visits (Telemedicine).  Patients are able to view lab/test results, encounter notes, upcoming appointments, etc.  Non-urgent messages can be sent to your provider as well.   To learn more about what you can do with MyChart, go to [email or web address].    Your next appointment:   1 month(s)  Provider:   Edd Fabian, NP

## 2022-08-04 NOTE — Telephone Encounter (Signed)
Pt has OV on 7/22

## 2022-08-05 ENCOUNTER — Telehealth: Payer: Self-pay | Admitting: Emergency Medicine

## 2022-08-05 LAB — BASIC METABOLIC PANEL
BUN/Creatinine Ratio: 18 (ref 12–28)
BUN: 24 mg/dL (ref 8–27)
CO2: 24 mmol/L (ref 20–29)
Calcium: 9.5 mg/dL (ref 8.7–10.3)
Chloride: 103 mmol/L (ref 96–106)
Creatinine, Ser: 1.31 mg/dL — ABNORMAL HIGH (ref 0.57–1.00)
Glucose: 134 mg/dL — ABNORMAL HIGH (ref 70–99)
Potassium: 4.3 mmol/L (ref 3.5–5.2)
Sodium: 143 mmol/L (ref 134–144)
eGFR: 40 mL/min/{1.73_m2} — ABNORMAL LOW (ref >59)

## 2022-08-05 MED ORDER — EMPAGLIFLOZIN 10 MG PO TABS: 10.0000 mg | ORAL_TABLET | Freq: Every day | ORAL | 3 refills | Status: DC

## 2022-08-05 NOTE — Telephone Encounter (Signed)
Okay to use Jardiance 10mg  every day instead.   Alver Sorrow, NP

## 2022-08-05 NOTE — Addendum Note (Signed)
Addended by: Scheryl Marten on: 08/05/2022 05:17 PM   Modules accepted: Orders

## 2022-08-05 NOTE — Telephone Encounter (Signed)
Called, no answer, left message with call back number; stating that we have some information to go over about her lab results  Alver Sorrow, NP  Fabiola Backer L, LPN Stable kidney function. Normal electrolytes. Recommend addition of SLGT2i for heart failure benefit.  Start Farxiga 10mg  daily with BMP in 1 week.  Orders for BMP in one week- already ordered

## 2022-08-05 NOTE — Telephone Encounter (Signed)
Sent prescription for Jardiance 10 mg daily to pt's pharmacy.

## 2022-08-08 ENCOUNTER — Encounter: Payer: Self-pay | Admitting: Family Medicine

## 2022-08-08 ENCOUNTER — Ambulatory Visit (INDEPENDENT_AMBULATORY_CARE_PROVIDER_SITE_OTHER): Payer: Medicare PPO | Admitting: Family Medicine

## 2022-08-08 VITALS — BP 140/90 | HR 59 | Wt 176.6 lb

## 2022-08-08 DIAGNOSIS — I480 Paroxysmal atrial fibrillation: Secondary | ICD-10-CM | POA: Diagnosis not present

## 2022-08-08 DIAGNOSIS — I5041 Acute combined systolic (congestive) and diastolic (congestive) heart failure: Secondary | ICD-10-CM | POA: Diagnosis not present

## 2022-08-08 DIAGNOSIS — N1831 Chronic kidney disease, stage 3a: Secondary | ICD-10-CM | POA: Diagnosis not present

## 2022-08-08 DIAGNOSIS — I10 Essential (primary) hypertension: Secondary | ICD-10-CM

## 2022-08-08 MED ORDER — HYDRALAZINE HCL 50 MG PO TABS: ORAL_TABLET | ORAL | Status: DC

## 2022-08-08 NOTE — Progress Notes (Signed)
OFFICE VISIT  08/08/2022  CC:  Chief Complaint  Patient presents with   Rehabilitation Follow-up    Pt was having severe SOB. Pt had a cardioversion done twice, then was in [facility] for 2 weeks. 4 weeks in rehab.     Patient is a 87 y.o. female who presents for follow-up hypertension, hyperlipidemia, chronic renal insufficiency stage III, and generalized anxiety. I last saw her 01/05/22.  INTERIM HX: Brenda Matthews was admitted to the hospital on 06/11/2022 for acute CHF, A-fib with controlled rate.  Echo showed EF 50 to 55%, moderate MR, mild AR, no aortic stenosis, aortic dilation measuring 41 mm. She underwent DC cardioversion for her A-fib and flutter on 06/17/2022 but did not stay in rhythm.  She was discharged home on amiodarone, Lopressor, and Eliquis.   She underwent DC cardioversion again on 07/11/2022. At her cardiology follow-up visit on 08/04/2022 her amiodarone was decreased to 200 mg once daily. Repeat lab work on that day showed lytes nl, sCr stable at 1.31. Jardiance 10 mg was added to her medication regimen but she is unaware and has not picked this up yet.  She currently takes Lopressor 100 mg twice a day and Lasix 20 mg a day and 20 mill equivalents of potassium per day. She takes an alprazolam usually around 1 time per day and this is always at night.  She was in [facility] skilled nursing facility from 6/7 to 7/5, 2024.  Brenda Matthews feels well. Her blood pressures while in assisted living were approximately 120-150 systolic over 50-90 diastolic. She has no palpitations, dizziness, shortness of breath, chest pain, or lower extremity swelling.  She does wear compression stockings.  She is trying to adhere to a 1.5 grams sodium and 1 L fluid intake per day.   PMP AWARE reviewed today: most recent rx for alprazolam was filled 09/17/21, # 90, rx by me. No red flags.   Past Medical History:  Diagnosis Date   Abnormal EKG approx 2008   Nuclear stress test neg;    Acute pancreatitis 09/2020    idiopathic.  +panc psudocyst   Anxiety    with panic   CAP (community acquired pneumonia) 08/2017   Hospitalization for CAP/acute diast HF/rapid a-fib   Cataract    s/p surgery--lens implants   Chronic diastolic heart failure (HCC) 2019   Chronic renal insufficiency, stage 3 (moderate) (HCC) 12/04/2012   Renal u/s when in hosp 08/2017 for CAP/CHF showed symmetric kidneys, echogenicity normal, w/out hydronephrosis. Baseline GFR around 40 ml/min as of 10/2018.   DDD (degenerative disc disease), lumbar    Diverticulosis 2009   Fracture of radial shaft, left, closed 11/16/2010   fell down flight of stairs   History of kidney stones    Hx of adenomatous colonic polyps 2002;2009;2015   surveillance colonoscopy 2009, +polypectomy done-tubular adenoma w/out high grade.  05/2013 tubular adenomas--recall 3 yrs   Hyperlipidemia    Hypertension    Low TSH level 02/18/2016   T3 norm, T4 mildly elevated--suspected sick euthyroid syndrome.  Repeat labs 06/2016: normal.   Melanoma in situ (HCC) 06/2018   L LL   Osteoarthritis of both knees    viscosupplementation injections helpful 2020/21   Osteopenia    DEXA 08/2010; repeat DEXA 02/2015 worse: fosamax started.  06/2018 Dexa T score -2.4.  2020 maj osteop fx risk = 24%, Hip fx risk 7.3%. 09/2020 T score -2.1   PAF (paroxysmal atrial fibrillation) (HCC) 02/2016   2018.   DCCV x 2 2024.  Peripheral edema    Pneumonia 2015   hx with sepsis   Rheumatic fever     Past Surgical History:  Procedure Laterality Date   APPENDECTOMY  1966   done during surgery for tubal pregnancy   CARDIOVERSION N/A 06/17/2022   Procedure: CARDIOVERSION;  Surgeon: Jodelle Red, MD;  Location: [facility] INVASIVE CV LAB;  Service: Cardiovascular;  Laterality: N/A;   CARDIOVERSION N/A 07/11/2022   Procedure: CARDIOVERSION;  Surgeon: Dolores Patty, MD;  Location: [facility] INVASIVE CV LAB;  Service: Cardiovascular;  Laterality: N/A;   CATARACT EXTRACTION W/ INTRAOCULAR  LENS IMPLANT  2013   bilat   COLONOSCOPY W/ POLYPECTOMY  05/2013   +diverticulosis; recall 3 yrs (Dr. Marina Goodell)   DEXA  02/2015; 06/2018   T score -2.1 in both femoral necks; FRAX 10 yr risk of major osteoporotic fracture was 21%---fosamax started. 06/2018 T score -2.4.  T score 09/2020 -2.1. Rpt 2 yrs.   ECTOPIC PREGNANCY SURGERY     EYE SURGERY     LEFT HEART CATH AND CORONARY ANGIOGRAPHY N/A 09/01/2017   No angiographically significant CAD.  Upper normal left ventricular filling pressure.  Procedure: LEFT HEART CATH AND CORONARY ANGIOGRAPHY;  Surgeon: Yvonne Kendall, MD;  Location: [facility] INVASIVE CV LAB;  Service: Cardiovascular;  Laterality: N/A;   LUMBAR LAMINECTOMY/DECOMPRESSION MICRODISCECTOMY N/A 09/26/2018   Procedure: Decompressive Lumbar Laminectomy L5 S1 FORAMINOTOMY L5 S1  NERVE ROOT BILATERALLY and Microdiscectomy L5-S1 Left;  Surgeon: Ranee Gosselin, MD;  Location: [facility];  Service: Orthopedics;  Laterality: N/A;    OPEN REDUCTION INTERNAL FIXATION (ORIF) TIBIA/FIBULA FRACTURE Left 02/18/2016   Procedure: OPEN REDUCTION INTERNAL FIXATION (ORIF) Right ankle trimalleolar fracture;  Surgeon: Toni Arthurs, MD;  Location: [facility] OR;  Service: Orthopedics;  Laterality: Left;  requests   ORIF RADIAL FRACTURE  11/18/2010   left; s/p slip on slippery floor and fell   THORACENTESIS  08/2017   diagnostic and therapeutic.  Transudative.  Clx neg.  (+pulm edema/diastolic HF)   TONSILLECTOMY     TRANSTHORACIC ECHOCARDIOGRAM  02/18/2016; 08/10/17   LVEF of 55-60%, mild AI and mild MR and normal biatrial size.  07/2017--normal LV function, mild enlarge aortic root, mild/mod TR, bilat atrial enlargement. 06/09/22 EF 50-55%, mod MR, aortic root stable enlgmt    Outpatient Medications Prior to Visit  Medication Sig Dispense Refill   acetaminophen (TYLENOL) 650 MG CR tablet Take 1,300 mg by mouth daily.     albuterol (VENTOLIN HFA) 108 (90 Base) MCG/ACT inhaler INHALE 2 PUFFS BY MOUTH into THE  lungs EVERY 4 HOURS AS NEEDED FOR WHEEZING OR SHORTNESS OF BREATH (Patient taking differently: Inhale 2 puffs into the lungs See admin instructions. INHALE 2 PUFFS BY MOUTH into THE lungs EVERY 4 HOURS AS NEEDED FOR WHEEZING OR SHORTNESS OF BREATH) 18 g 1   ALPRAZolam (XANAX) 0.5 MG tablet Take 1 tablet (0.5 mg total) by mouth 3 (three) times daily as needed for anxiety. TAKE 1 TABLET BY MOUTH 3 TIMES DAILY AS NEEDED FOR ANXIETY 30 tablet 0   amiodarone (PACERONE) 200 MG tablet Take 1 tablet (200 mg total) by mouth daily. 90 tablet 3   apixaban (ELIQUIS) 5 MG TABS tablet Take 1 tablet (5 mg total) by mouth 2 (two) times daily. 180 tablet 3   Apoaequorin (PREVAGEN PO) Take 1 tablet by mouth daily.     Ascorbic Acid (VITAMIN C PO) Take by mouth daily.     atorvastatin (LIPITOR) 40 MG tablet TAKE 1 TABLET BY MOUTH  EVERY DAY 90 tablet 1   Biotin 5000 MCG TABS Take 5,000 mcg by mouth daily.     Calcium Carbonate (CALCIUM 600 PO) Take 1,200 mg by mouth daily.      Coenzyme Q10 200 MG capsule Take 200 mg by mouth daily.     empagliflozin (JARDIANCE) 10 MG TABS tablet Take 1 tablet (10 mg total) by mouth daily before breakfast. 90 tablet 3   fluticasone (FLONASE) 50 MCG/ACT nasal spray Place 2 sprays into both nostrils daily.      furosemide (LASIX) 20 MG tablet Take 1 tablet (20 mg total) by mouth daily. 90 tablet 3   hydrocortisone cream 1 % Apply 1 Application topically as needed for itching.     metoprolol tartrate (LOPRESSOR) 100 MG tablet Take 1 tablet (100 mg total) by mouth 2 (two) times daily. 180 tablet 2   Multiple Vitamins-Minerals (CENTRUM SILVER ULTRA WOMENS) TABS Take 1 tablet by mouth every evening.     potassium chloride SA (KLOR-CON M) 20 MEQ tablet Take 1 tablet (20 mEq total) by mouth daily. 90 tablet 3   carboxymethylcellulose (REFRESH PLUS) 0.5 % SOLN Place 2 drops into both eyes daily as needed (dry/irritated eyes.). (Patient not taking: Reported on 08/04/2022)     No  facility-administered medications prior to visit.    Allergies  Allergen Reactions   Augmentin [Amoxicillin-Pot Clavulanate] Nausea And Vomiting and Other (See Comments)    "projectile vomiting" Has patient had a PCN reaction causing immediate rash, facial/tongue/throat swelling, SOB or lightheadedness with hypotension:No Has patient had a PCN reaction causing severe rash involving mucus membranes or skin necrosis:No Has patient had a PCN reaction that required hospitalization:No Has patient had a PCN reaction occurring within the last 10 years:Yes If all of the above answers are "NO", then may proceed with Cephalosporin use.    Amoxicillin Rash   Clindamycin/Lincomycin Rash    Review of Systems As per HPI  PE:    08/08/2022    3:16 PM 08/08/2022    3:08 PM 08/04/2022    2:50 PM  Vitals with BMI  Height   5' 1.5"  Weight  176 lbs 10 oz 175 lbs 13 oz  BMI  32.83 32.68  Systolic 140 192 161  Diastolic 90 82 70  Pulse  59 58     Physical Exam  Gen: Alert, well appearing.  Patient is oriented to person, place, time, and situation. AFFECT: pleasant, lucid thought and speech. CV: RRR, mildly distant S1 and S2, no murmur, no rub Chest is clear, no wheezing or rales. Normal symmetric air entry throughout both lung fields. No chest wall deformities or tenderness. EXT: no clubbing or cyanosis.  no edema.    LABS:  Last CBC Lab Results  Component Value Date   WBC 8.3 07/05/2022   HGB 12.8 07/05/2022   HCT 40.8 07/05/2022   MCV 90.7 07/05/2022   MCH 28.4 07/05/2022   RDW 14.8 07/05/2022   PLT 189 07/05/2022   Last metabolic panel Lab Results  Component Value Date   GLUCOSE 134 (H) 08/04/2022   NA 143 08/04/2022   K 4.3 08/04/2022   CL 103 08/04/2022   CO2 24 08/04/2022   BUN 24 08/04/2022   CREATININE 1.31 (H) 08/04/2022   EGFR 40 (L) 08/04/2022   CALCIUM 9.5 08/04/2022   PHOS 2.3 (L) 09/18/2020   PROT 6.5 06/11/2022   ALBUMIN 3.5 06/11/2022   LABGLOB 2.4  09/24/2020   AGRATIO 1.5 09/24/2020   BILITOT  0.8 06/11/2022   ALKPHOS 84 06/11/2022   AST 33 06/11/2022   ALT 30 06/11/2022   ANIONGAP 9 07/05/2022   Last lipids Lab Results  Component Value Date   CHOL 144 01/05/2022   HDL 60.90 01/05/2022   LDLCALC 61 01/05/2022   LDLDIRECT 66.0 09/10/2018   TRIG 113.0 01/05/2022   CHOLHDL 2 01/05/2022   Last thyroid functions Lab Results  Component Value Date   TSH 0.68 10/22/2018   T3TOTAL 115.0 07/04/2016   Last vitamin D Lab Results  Component Value Date   VD25OH 37 05/25/2010   IMPRESSION AND PLAN:  #1 atrial fibrillation. Successful DCCV about 1 month ago.   She continues on amiodarone 200 mg once a day, Lopressor 100 mg twice a day, and Eliquis 5 mg twice a day.  #2 acute on chronic HF. Resolved with diuresis, A-fib cardioversion, and improved blood pressure control. Continue Lasix 20 mg a day and will try to improve blood pressure control by adding hydralazine one half of the 50 mg tab 3 times a day.  3.  Hypertension, uncontrolled. Her average BP on 3 checks here today at the office was 165/90. Restart hydralazine one half of 50 mg tab 3 times a day.  Continue Lopressor 100 mg twice daily.  4.  Chronic renal insufficiency stage III. Serum creatinine 4 days ago stable at 1.3. Electrolytes are normal.  She will continue 20 mill equivalents of potassium daily.  #5 preventative health care: Vaccines: All up-to-date  Breast cancer screening: Mammogram UTD as of 09/2020. Osteoporosis screening: Due for next DEXA after September 2024  An After Visit Summary was printed and given to the patient.  FOLLOW UP: Return in about 1 week (around 08/15/2022) for f/u HTN.  Signed:  Santiago Bumpers, MD           08/08/2022

## 2022-08-08 NOTE — Patient Instructions (Signed)
Restart your hydralazine at a dose of HALF of a 50mg  tab three times a day

## 2022-08-10 DIAGNOSIS — M6281 Muscle weakness (generalized): Secondary | ICD-10-CM | POA: Diagnosis not present

## 2022-08-10 DIAGNOSIS — R2689 Other abnormalities of gait and mobility: Secondary | ICD-10-CM | POA: Diagnosis not present

## 2022-08-11 ENCOUNTER — Other Ambulatory Visit: Payer: Self-pay

## 2022-08-11 DIAGNOSIS — I5032 Chronic diastolic (congestive) heart failure: Secondary | ICD-10-CM | POA: Diagnosis not present

## 2022-08-11 DIAGNOSIS — N1832 Chronic kidney disease, stage 3b: Secondary | ICD-10-CM

## 2022-08-12 ENCOUNTER — Telehealth: Payer: Self-pay | Admitting: Emergency Medicine

## 2022-08-12 DIAGNOSIS — N1832 Chronic kidney disease, stage 3b: Secondary | ICD-10-CM

## 2022-08-12 NOTE — Telephone Encounter (Signed)
Brenda Sorrow, NP  Alyson Ingles, LPN Normal electrolytes.  Creatinine slightly increased from previous but this is expected with the addition of Jardiance.  Recommend continuing current medications and repeat BMP in 2 to 3 weeks for monitoring.  Spoke with pt's son (EC). Informed him that I was unable to get in touch with his mother, voicemail is full. Was unable to give results to pt's son, no dpr on file. Informed him that the pt can view results and recommendations on MyChart. But we do need her to come to office in 2-3 weeks for repeat lab work. He said that he will give his mother the message. Gave call back number.   Order placed for BMP

## 2022-08-17 DIAGNOSIS — M6281 Muscle weakness (generalized): Secondary | ICD-10-CM | POA: Diagnosis not present

## 2022-08-17 DIAGNOSIS — R2689 Other abnormalities of gait and mobility: Secondary | ICD-10-CM | POA: Diagnosis not present

## 2022-08-17 NOTE — Patient Instructions (Incomplete)
  It was very nice to see you today!   PLEASE NOTE:   If you had any lab tests please let us know if you have not heard back within a few days. You may see your results on MyChart before we have a chance to review them but we will give you a call once they are reviewed by [place]. If we ordered any referrals today, please let us know if you have not heard from their office within the next 2 weeks. You should receive a letter via MyChart confirming if the referral was approved and their office contact information to schedule.

## 2022-08-18 ENCOUNTER — Encounter: Payer: Self-pay | Admitting: Family Medicine

## 2022-08-18 ENCOUNTER — Ambulatory Visit (INDEPENDENT_AMBULATORY_CARE_PROVIDER_SITE_OTHER): Payer: Medicare PPO | Admitting: Family Medicine

## 2022-08-18 VITALS — BP 163/82 | HR 60 | Temp 97.9°F | Wt 176.0 lb

## 2022-08-18 DIAGNOSIS — N1832 Chronic kidney disease, stage 3b: Secondary | ICD-10-CM | POA: Diagnosis not present

## 2022-08-18 DIAGNOSIS — I10 Essential (primary) hypertension: Secondary | ICD-10-CM

## 2022-08-18 MED ORDER — HYDRALAZINE HCL 50 MG PO TABS: ORAL_TABLET | ORAL | Status: DC

## 2022-08-18 NOTE — Progress Notes (Signed)
OFFICE VISIT  08/18/2022  CC:  Chief Complaint  Patient presents with   Follow-up    1 week follow up on BP. She states it has been high still and she has been having headaches.    Patient is a 87 y.o. female who presents for 8-day follow-up uncontrolled hypertension. A/P as of last visit: " Hypertension, uncontrolled. Her average BP on 3 checks here today at the office was 165/90. Restart hydralazine one half of 50 mg tab 3 times a day.  Continue Lopressor 100 mg twice daily."  INTERIM HX: Home blood pressures are little bit improved.  150s to 160s systolic.  Usually 70s diastolic. She has a mild headache when she wakes up in the morning but it resolved without medication. These headaches were going on before she restarted her hydralazine last visit.  Denies palpitations.  She says that she has not had any episodes of A-fib since I last saw her. No recent change in lower legs.  No shortness of breath.  No dyspnea on exertion.  Past Medical History:  Diagnosis Date   Abnormal EKG approx 2008   Nuclear stress test neg;    Acute pancreatitis 09/2020   idiopathic.  +panc psudocyst   Anxiety    with panic   CAP (community acquired pneumonia) 08/2017   Hospitalization for CAP/acute diast HF/rapid a-fib   Cataract    s/p surgery--lens implants   Chronic diastolic heart failure (HCC) 2019   Chronic renal insufficiency, stage 3 (moderate) (HCC) 12/04/2012   Renal u/s when in hosp 08/2017 for CAP/CHF showed symmetric kidneys, echogenicity normal, w/out hydronephrosis. Baseline GFR around 40 ml/min as of 10/2018.   DDD (degenerative disc disease), lumbar    Diverticulosis 2009   Fracture of radial shaft, left, closed 11/16/2010   fell down flight of stairs   History of kidney stones    Hx of adenomatous colonic polyps 2002;2009;2015   surveillance colonoscopy 2009, +polypectomy done-tubular adenoma w/out high grade.  05/2013 tubular adenomas--recall 3 yrs   Hyperlipidemia     Hypertension    Low TSH level 02/18/2016   T3 norm, T4 mildly elevated--suspected sick euthyroid syndrome.  Repeat labs 06/2016: normal.   Melanoma in situ (HCC) 06/2018   L LL   Osteoarthritis of both knees    viscosupplementation injections helpful 2020/21   Osteopenia    DEXA 08/2010; repeat DEXA 02/2015 worse: fosamax started.  06/2018 Dexa T score -2.4.  2020 maj osteop fx risk = 24%, Hip fx risk 7.3%. 09/2020 T score -2.1   PAF (paroxysmal atrial fibrillation) (HCC) 02/2016   2018.   DCCV x 2 2024.   Peripheral edema    Pneumonia 2015   hx with sepsis   Rheumatic fever     Past Surgical History:  Procedure Laterality Date   APPENDECTOMY  1966   done during surgery for tubal pregnancy   CARDIOVERSION N/A 06/17/2022   Procedure: CARDIOVERSION;  Surgeon: Jodelle Red, MD;  Location: [facility] INVASIVE CV LAB;  Service: Cardiovascular;  Laterality: N/A;   CARDIOVERSION N/A 07/11/2022   Procedure: CARDIOVERSION;  Surgeon: Dolores Patty, MD;  Location: [facility] INVASIVE CV LAB;  Service: Cardiovascular;  Laterality: N/A;   CATARACT EXTRACTION W/ INTRAOCULAR LENS IMPLANT  2013   bilat   COLONOSCOPY W/ POLYPECTOMY  05/2013   +diverticulosis; recall 3 yrs (Dr. Marina Goodell)   DEXA  02/2015; 06/2018   T score -2.1 in both femoral necks; FRAX 10 yr risk of major osteoporotic fracture was 21%---fosamax started.  06/2018 T score -2.4.  T score 09/2020 -2.1. Rpt 2 yrs.   ECTOPIC PREGNANCY SURGERY     EYE SURGERY     LEFT HEART CATH AND CORONARY ANGIOGRAPHY N/A 09/01/2017   No angiographically significant CAD.  Upper normal left ventricular filling pressure.  Procedure: LEFT HEART CATH AND CORONARY ANGIOGRAPHY;  Surgeon: Yvonne Kendall, MD;  Location: [facility] INVASIVE CV LAB;  Service: Cardiovascular;  Laterality: N/A;   LUMBAR LAMINECTOMY/DECOMPRESSION MICRODISCECTOMY N/A 09/26/2018   Procedure: Decompressive Lumbar Laminectomy L5 S1 FORAMINOTOMY L5 S1  NERVE ROOT BILATERALLY and Microdiscectomy L5-S1  Left;  Surgeon: Ranee Gosselin, MD;  Location: [facility];  Service: Orthopedics;  Laterality: N/A;    OPEN REDUCTION INTERNAL FIXATION (ORIF) TIBIA/FIBULA FRACTURE Left 02/18/2016   Procedure: OPEN REDUCTION INTERNAL FIXATION (ORIF) Right ankle trimalleolar fracture;  Surgeon: Toni Arthurs, MD;  Location: [facility] OR;  Service: Orthopedics;  Laterality: Left;  requests   ORIF RADIAL FRACTURE  11/18/2010   left; s/p slip on slippery floor and fell   THORACENTESIS  08/2017   diagnostic and therapeutic.  Transudative.  Clx neg.  (+pulm edema/diastolic HF)   TONSILLECTOMY     TRANSTHORACIC ECHOCARDIOGRAM  02/18/2016; 08/10/17   LVEF of 55-60%, mild AI and mild MR and normal biatrial size.  07/2017--normal LV function, mild enlarge aortic root, mild/mod TR, bilat atrial enlargement. 06/09/22 EF 50-55%, mod MR, aortic root stable enlgmt    Outpatient Medications Prior to Visit  Medication Sig Dispense Refill   acetaminophen (TYLENOL) 650 MG CR tablet Take 1,300 mg by mouth daily.     albuterol (VENTOLIN HFA) 108 (90 Base) MCG/ACT inhaler INHALE 2 PUFFS BY MOUTH into THE lungs EVERY 4 HOURS AS NEEDED FOR WHEEZING OR SHORTNESS OF BREATH (Patient taking differently: Inhale 2 puffs into the lungs See admin instructions. INHALE 2 PUFFS BY MOUTH into THE lungs EVERY 4 HOURS AS NEEDED FOR WHEEZING OR SHORTNESS OF BREATH) 18 g 1   ALPRAZolam (XANAX) 0.5 MG tablet Take 1 tablet (0.5 mg total) by mouth 3 (three) times daily as needed for anxiety. TAKE 1 TABLET BY MOUTH 3 TIMES DAILY AS NEEDED FOR ANXIETY 30 tablet 0   amiodarone (PACERONE) 200 MG tablet Take 1 tablet (200 mg total) by mouth daily. 90 tablet 3   apixaban (ELIQUIS) 5 MG TABS tablet Take 1 tablet (5 mg total) by mouth 2 (two) times daily. 180 tablet 3   Apoaequorin (PREVAGEN PO) Take 1 tablet by mouth daily.     Ascorbic Acid (VITAMIN C PO) Take by mouth daily.     atorvastatin (LIPITOR) 40 MG tablet TAKE 1 TABLET BY MOUTH EVERY DAY 90 tablet 1    Biotin 5000 MCG TABS Take 5,000 mcg by mouth daily.     Calcium Carbonate (CALCIUM 600 PO) Take 1,200 mg by mouth daily.      Coenzyme Q10 200 MG capsule Take 200 mg by mouth daily.     empagliflozin (JARDIANCE) 10 MG TABS tablet Take 1 tablet (10 mg total) by mouth daily before breakfast. 90 tablet 3   fluticasone (FLONASE) 50 MCG/ACT nasal spray Place 2 sprays into both nostrils daily.      furosemide (LASIX) 20 MG tablet Take 1 tablet (20 mg total) by mouth daily. 90 tablet 3   hydrocortisone cream 1 % Apply 1 Application topically as needed for itching.     metoprolol tartrate (LOPRESSOR) 100 MG tablet Take 1 tablet (100 mg total) by mouth 2 (two) times daily. 180 tablet  2   Multiple Vitamins-Minerals (CENTRUM SILVER ULTRA WOMENS) TABS Take 1 tablet by mouth every evening.     potassium chloride SA (KLOR-CON M) 20 MEQ tablet Take 1 tablet (20 mEq total) by mouth daily. 90 tablet 3   hydrALAZINE (APRESOLINE) 50 MG tablet 1/2 tab po tid     carboxymethylcellulose (REFRESH PLUS) 0.5 % SOLN Place 2 drops into both eyes daily as needed (dry/irritated eyes.). (Patient not taking: Reported on 08/18/2022)     No facility-administered medications prior to visit.    Allergies  Allergen Reactions   Augmentin [Amoxicillin-Pot Clavulanate] Nausea And Vomiting and Other (See Comments)    "projectile vomiting" Has patient had a PCN reaction causing immediate rash, facial/tongue/throat swelling, SOB or lightheadedness with hypotension:No Has patient had a PCN reaction causing severe rash involving mucus membranes or skin necrosis:No Has patient had a PCN reaction that required hospitalization:No Has patient had a PCN reaction occurring within the last 10 years:Yes If all of the above answers are "NO", then may proceed with Cephalosporin use.    Amoxicillin Rash   Clindamycin/Lincomycin Rash    Review of Systems As per HPI  PE:    08/18/2022    1:43 PM 08/18/2022    1:35 PM 08/08/2022    3:16 PM   Vitals with BMI  Weight  176 lbs   BMI  32.72   Systolic 163 171 664  Diastolic 82 76 90  Pulse  60     Physical Exam  Gen: Alert, well appearing.  Patient is oriented to person, place, time, and situation. AFFECT: pleasant, lucid thought and speech. EXT: no pitting edema on R, trace on L.  Compression hose are on.  LABS:  Last CBC Lab Results  Component Value Date   WBC 8.3 07/05/2022   HGB 12.8 07/05/2022   HCT 40.8 07/05/2022   MCV 90.7 07/05/2022   MCH 28.4 07/05/2022   RDW 14.8 07/05/2022   PLT 189 07/05/2022   Last metabolic panel Lab Results  Component Value Date   GLUCOSE 90 08/11/2022   NA 142 08/11/2022   K 4.4 08/11/2022   CL 104 08/11/2022   CO2 23 08/11/2022   BUN 25 08/11/2022   CREATININE 1.52 (H) 08/11/2022   EGFR 33 (L) 08/11/2022   CALCIUM 9.3 08/11/2022   PHOS 2.3 (L) 09/18/2020   PROT 6.5 06/11/2022   ALBUMIN 3.5 06/11/2022   LABGLOB 2.4 09/24/2020   AGRATIO 1.5 09/24/2020   BILITOT 0.8 06/11/2022   ALKPHOS 84 06/11/2022   AST 33 06/11/2022   ALT 30 06/11/2022   ANIONGAP 9 07/05/2022     Lab Results  Component Value Date   CHOL 144 01/05/2022   HDL 60.90 01/05/2022   LDLCALC 61 01/05/2022   LDLDIRECT 66.0 09/10/2018   TRIG 113.0 01/05/2022   CHOLHDL 2 01/05/2022   IMPRESSION AND PLAN:  #1 hypertension, uncontrolled but improving. Increase hydralazine to a whole 50 mg tabs 3 times a day.  Continue Lopressor 100 mg twice a day.  #2 chronic renal insufficiency stage IIIb. Most recent serum creatinine bumped a little bit on 08/11/2022. She is on 1 L fluid restriction at this time.  Her weight has been very stable. She has no sign of fluid retention right now. She takes Lasix 20 mg every day. I am going to liberalize her fluid intake just a little to 1.25 L/day. Cardiology already has plans to recheck her kidney function in a couple weeks.  If this has not been  done when I see her back in 2 weeks I will check it then.  An After  Visit Summary was printed and given to the patient.  FOLLOW UP: Return in about 2 weeks (around 09/01/2022) for f/u HTN.  Signed:  Santiago Bumpers, MD           08/18/2022

## 2022-08-19 DIAGNOSIS — R278 Other lack of coordination: Secondary | ICD-10-CM | POA: Diagnosis not present

## 2022-08-19 DIAGNOSIS — M6281 Muscle weakness (generalized): Secondary | ICD-10-CM | POA: Diagnosis not present

## 2022-08-22 DIAGNOSIS — M6281 Muscle weakness (generalized): Secondary | ICD-10-CM | POA: Diagnosis not present

## 2022-08-22 DIAGNOSIS — R2689 Other abnormalities of gait and mobility: Secondary | ICD-10-CM | POA: Diagnosis not present

## 2022-08-24 NOTE — Patient Instructions (Incomplete)
It was very nice to see you today!   PLEASE NOTE:    Please give ample time to the testing facility, and our office to run, receive and review results. Please do not call inquiring of results, even if you can see them in your chart. We will contact you as soon as we are able. If it has been over 1 week since the test was completed, and you have not yet heard from [place], then please call us.   If we ordered any referrals today, please let us know if you have not heard from their office within the next 2 weeks. You should receive a letter via MyChart confirming if the referral was approved and their office contact information to schedule.

## 2022-08-25 ENCOUNTER — Encounter: Payer: Self-pay | Admitting: Family Medicine

## 2022-08-25 ENCOUNTER — Ambulatory Visit: Payer: Medicare PPO | Admitting: Family Medicine

## 2022-08-25 VITALS — BP 162/80 | HR 56 | Wt 174.2 lb

## 2022-08-25 DIAGNOSIS — I10 Essential (primary) hypertension: Secondary | ICD-10-CM

## 2022-08-25 MED ORDER — HYDRALAZINE HCL 50 MG PO TABS: ORAL_TABLET | ORAL | Status: DC

## 2022-08-25 NOTE — Progress Notes (Signed)
OFFICE VISIT  08/25/2022  CC:  Chief Complaint  Patient presents with   Blood Pressure     Elevated BP; Got up to 180 sys, can't remember what ran on dys. Started yesterday. Pt brought machine she has been using with her today.     Patient is a 87 y.o. female who presents for elevated blood pressures. I last saw her 1 week ago. A/P as of that visit: "1 hypertension, uncontrolled but improving. Increase hydralazine to a whole 50 mg tabs 3 times a day.  Continue Lopressor 100 mg twice a day.   #2 chronic renal insufficiency stage IIIb. Most recent serum creatinine bumped a little bit on 08/11/2022. She is on 1 L fluid restriction at this time.  Her weight has been very stable. She has no sign of fluid retention right now. She takes Lasix 20 mg every day. I am going to liberalize her fluid intake just a little to 1.25 L/day. Cardiology already has plans to recheck her kidney function in a couple weeks.  If this has not been done when I see her back in 2 weeks I will check it then."  INTERIM HX: Today her blood pressure was in the 180's systolic.  She had a headache. She did increase her hydralazine to 50 mg 3 times a day like we discussed last visit.  Has not felt any palpitations.  Her watch has not shown any A-fib. No change in the lower extremities.  Past Medical History:  Diagnosis Date   Abnormal EKG approx 2008   Nuclear stress test neg;    Acute pancreatitis 09/2020   idiopathic.  +panc psudocyst   Anxiety    with panic   CAP (community acquired pneumonia) 08/2017   Hospitalization for CAP/acute diast HF/rapid a-fib   Cataract    s/p surgery--lens implants   Chronic diastolic heart failure (HCC) 2019   Chronic renal insufficiency, stage 3 (moderate) (HCC) 12/04/2012   Renal u/s when in hosp 08/2017 for CAP/CHF showed symmetric kidneys, echogenicity normal, w/out hydronephrosis. Baseline GFR around 40 ml/min as of 10/2018.   DDD (degenerative disc disease), lumbar     Diverticulosis 2009   Fracture of radial shaft, left, closed 11/16/2010   fell down flight of stairs   History of kidney stones    Hx of adenomatous colonic polyps 2002;2009;2015   surveillance colonoscopy 2009, +polypectomy done-tubular adenoma w/out high grade.  05/2013 tubular adenomas--recall 3 yrs   Hyperlipidemia    Hypertension    Low TSH level 02/18/2016   T3 norm, T4 mildly elevated--suspected sick euthyroid syndrome.  Repeat labs 06/2016: normal.   Melanoma in situ (HCC) 06/2018   L LL   Osteoarthritis of both knees    viscosupplementation injections helpful 2020/21   Osteopenia    DEXA 08/2010; repeat DEXA 02/2015 worse: fosamax started.  06/2018 Dexa T score -2.4.  2020 maj osteop fx risk = 24%, Hip fx risk 7.3%. 09/2020 T score -2.1   PAF (paroxysmal atrial fibrillation) (HCC) 02/2016   2018.   DCCV x 2 2024.   Peripheral edema    Pneumonia 2015   hx with sepsis   Rheumatic fever     Past Surgical History:  Procedure Laterality Date   APPENDECTOMY  1966   done during surgery for tubal pregnancy   CARDIOVERSION N/A 06/17/2022   Procedure: CARDIOVERSION;  Surgeon: Jodelle Red, MD;  Location: [facility] INVASIVE CV LAB;  Service: Cardiovascular;  Laterality: N/A;   CARDIOVERSION N/A 07/11/2022   Procedure:  CARDIOVERSION;  Surgeon: Dolores Patty, MD;  Location: [facility] INVASIVE CV LAB;  Service: Cardiovascular;  Laterality: N/A;   CATARACT EXTRACTION W/ INTRAOCULAR LENS IMPLANT  2013   bilat   COLONOSCOPY W/ POLYPECTOMY  05/2013   +diverticulosis; recall 3 yrs (Dr. Marina Goodell)   DEXA  02/2015; 06/2018   T score -2.1 in both femoral necks; FRAX 10 yr risk of major osteoporotic fracture was 21%---fosamax started. 06/2018 T score -2.4.  T score 09/2020 -2.1. Rpt 2 yrs.   ECTOPIC PREGNANCY SURGERY     EYE SURGERY     LEFT HEART CATH AND CORONARY ANGIOGRAPHY N/A 09/01/2017   No angiographically significant CAD.  Upper normal left ventricular filling pressure.  Procedure: LEFT HEART  CATH AND CORONARY ANGIOGRAPHY;  Surgeon: Yvonne Kendall, MD;  Location: [facility] INVASIVE CV LAB;  Service: Cardiovascular;  Laterality: N/A;   LUMBAR LAMINECTOMY/DECOMPRESSION MICRODISCECTOMY N/A 09/26/2018   Procedure: Decompressive Lumbar Laminectomy L5 S1 FORAMINOTOMY L5 S1  NERVE ROOT BILATERALLY and Microdiscectomy L5-S1 Left;  Surgeon: Ranee Gosselin, MD;  Location: [facility];  Service: Orthopedics;  Laterality: N/A;    OPEN REDUCTION INTERNAL FIXATION (ORIF) TIBIA/FIBULA FRACTURE Left 02/18/2016   Procedure: OPEN REDUCTION INTERNAL FIXATION (ORIF) Right ankle trimalleolar fracture;  Surgeon: Toni Arthurs, MD;  Location: [facility] OR;  Service: Orthopedics;  Laterality: Left;  requests   ORIF RADIAL FRACTURE  11/18/2010   left; s/p slip on slippery floor and fell   THORACENTESIS  08/2017   diagnostic and therapeutic.  Transudative.  Clx neg.  (+pulm edema/diastolic HF)   TONSILLECTOMY     TRANSTHORACIC ECHOCARDIOGRAM  02/18/2016; 08/10/17   LVEF of 55-60%, mild AI and mild MR and normal biatrial size.  07/2017--normal LV function, mild enlarge aortic root, mild/mod TR, bilat atrial enlargement. 06/09/22 EF 50-55%, mod MR, aortic root stable enlgmt    Outpatient Medications Prior to Visit  Medication Sig Dispense Refill   acetaminophen (TYLENOL) 650 MG CR tablet Take 1,300 mg by mouth daily.     albuterol (VENTOLIN HFA) 108 (90 Base) MCG/ACT inhaler INHALE 2 PUFFS BY MOUTH into THE lungs EVERY 4 HOURS AS NEEDED FOR WHEEZING OR SHORTNESS OF BREATH (Patient taking differently: Inhale 2 puffs into the lungs See admin instructions. INHALE 2 PUFFS BY MOUTH into THE lungs EVERY 4 HOURS AS NEEDED FOR WHEEZING OR SHORTNESS OF BREATH) 18 g 1   ALPRAZolam (XANAX) 0.5 MG tablet Take 1 tablet (0.5 mg total) by mouth 3 (three) times daily as needed for anxiety. TAKE 1 TABLET BY MOUTH 3 TIMES DAILY AS NEEDED FOR ANXIETY 30 tablet 0   amiodarone (PACERONE) 200 MG tablet Take 1 tablet (200 mg total) by mouth  daily. 90 tablet 3   apixaban (ELIQUIS) 5 MG TABS tablet Take 1 tablet (5 mg total) by mouth 2 (two) times daily. 180 tablet 3   Apoaequorin (PREVAGEN PO) Take 1 tablet by mouth daily.     Ascorbic Acid (VITAMIN C PO) Take by mouth daily.     atorvastatin (LIPITOR) 40 MG tablet TAKE 1 TABLET BY MOUTH EVERY DAY 90 tablet 1   Biotin 5000 MCG TABS Take 5,000 mcg by mouth daily.     Calcium Carbonate (CALCIUM 600 PO) Take 1,200 mg by mouth daily.      carboxymethylcellulose (REFRESH PLUS) 0.5 % SOLN Place 2 drops into both eyes daily as needed (dry/irritated eyes.).     Coenzyme Q10 200 MG capsule Take 200 mg by mouth daily.     empagliflozin (  JARDIANCE) 10 MG TABS tablet Take 1 tablet (10 mg total) by mouth daily before breakfast. 90 tablet 3   fluticasone (FLONASE) 50 MCG/ACT nasal spray Place 2 sprays into both nostrils daily.      furosemide (LASIX) 20 MG tablet Take 1 tablet (20 mg total) by mouth daily. 90 tablet 3   hydrocortisone cream 1 % Apply 1 Application topically as needed for itching.     metoprolol tartrate (LOPRESSOR) 100 MG tablet Take 1 tablet (100 mg total) by mouth 2 (two) times daily. 180 tablet 2   Multiple Vitamins-Minerals (CENTRUM SILVER ULTRA WOMENS) TABS Take 1 tablet by mouth every evening.     potassium chloride SA (KLOR-CON M) 20 MEQ tablet Take 1 tablet (20 mEq total) by mouth daily. 90 tablet 3   hydrALAZINE (APRESOLINE) 50 MG tablet 1 tab po tid     No facility-administered medications prior to visit.    Allergies  Allergen Reactions   Augmentin [Amoxicillin-Pot Clavulanate] Nausea And Vomiting and Other (See Comments)    "projectile vomiting" Has patient had a PCN reaction causing immediate rash, facial/tongue/throat swelling, SOB or lightheadedness with hypotension:No Has patient had a PCN reaction causing severe rash involving mucus membranes or skin necrosis:No Has patient had a PCN reaction that required hospitalization:No Has patient had a PCN reaction  occurring within the last 10 years:Yes If all of the above answers are "NO", then may proceed with Cephalosporin use.    Amoxicillin Rash   Clindamycin/Lincomycin Rash    Review of Systems As per HPI  PE:    08/25/2022   11:23 AM 08/25/2022   11:02 AM 08/18/2022    1:43 PM  Vitals with BMI  Weight  174 lbs 3 oz   BMI  32.39   Systolic 162 164 161  Diastolic 80 72 82  Pulse  56      Physical Exam  Gen: Alert, well appearing.  Patient is oriented to person, place, time, and situation. AFFECT: pleasant, lucid thought and speech. Patient's upper arm blood pressure cuff today read 180/78 on the left arm and 186/88 on the right arm. Manual blood pressure today on the left arm was 162/80 and 160/80 on the right arm.  LABS:  Last metabolic panel Lab Results  Component Value Date   GLUCOSE 90 08/11/2022   NA 142 08/11/2022   K 4.4 08/11/2022   CL 104 08/11/2022   CO2 23 08/11/2022   BUN 25 08/11/2022   CREATININE 1.52 (H) 08/11/2022   EGFR 33 (L) 08/11/2022   CALCIUM 9.3 08/11/2022   PHOS 2.3 (L) 09/18/2020   PROT 6.5 06/11/2022   ALBUMIN 3.5 06/11/2022   LABGLOB 2.4 09/24/2020   AGRATIO 1.5 09/24/2020   BILITOT 0.8 06/11/2022   ALKPHOS 84 06/11/2022   AST 33 06/11/2022   ALT 30 06/11/2022   ANIONGAP 9 07/05/2022      IMPRESSION AND PLAN:  #1 hypertension, ongoing poor control. We established that her blood pressure cuff does measure 20 points higher systolic than our manual cuff.  Blood pressures are symmetric. Increase hydralazine to 1-1/2 of the 50 mg tabs 3 times a day.  Continue Lopressor 100 mg 2 times a day.  An After Visit Summary was printed and given to the patient.  FOLLOW UP: Return for Keep appointment set for 09/01/2022.  Signed:  Santiago Bumpers, MD           08/25/2022

## 2022-08-29 DIAGNOSIS — R2689 Other abnormalities of gait and mobility: Secondary | ICD-10-CM | POA: Diagnosis not present

## 2022-08-29 DIAGNOSIS — M6281 Muscle weakness (generalized): Secondary | ICD-10-CM | POA: Diagnosis not present

## 2022-08-30 DIAGNOSIS — R2689 Other abnormalities of gait and mobility: Secondary | ICD-10-CM | POA: Diagnosis not present

## 2022-08-30 DIAGNOSIS — M6281 Muscle weakness (generalized): Secondary | ICD-10-CM | POA: Diagnosis not present

## 2022-09-01 ENCOUNTER — Ambulatory Visit (INDEPENDENT_AMBULATORY_CARE_PROVIDER_SITE_OTHER): Payer: Medicare PPO | Admitting: Family Medicine

## 2022-09-01 ENCOUNTER — Encounter: Payer: Self-pay | Admitting: Family Medicine

## 2022-09-01 VITALS — BP 138/70 | HR 59 | Temp 97.9°F | Wt 176.4 lb

## 2022-09-01 DIAGNOSIS — I10 Essential (primary) hypertension: Secondary | ICD-10-CM | POA: Diagnosis not present

## 2022-09-01 MED ORDER — HYDRALAZINE HCL 100 MG PO TABS: 100.0000 mg | ORAL_TABLET | Freq: Two times a day (BID) | ORAL | 1 refills | Status: DC

## 2022-09-01 NOTE — Progress Notes (Signed)
OFFICE VISIT  09/01/2022  CC:  Chief Complaint  Patient presents with   Hypertension    Follow up on BP. She states that it has been much better    Patient is a 87 y.o. female who presents for 1 week follow-up uncontrolled hypertension. A/P as of last visit: "#1 hypertension, ongoing poor control. We established that her blood pressure cuff does measure 20 points higher systolic than our manual cuff.  Blood pressures are symmetric. Increase hydralazine to 1-1/2 of the 50 mg tabs 3 times a day.  Continue Lopressor 100 mg 2 times a day."  INTERIM HX: Brenda Matthews is feeling well. No palpitations.  Her watch has not shown A-fib at all. Home blood pressures in the 130s to 140s on her cuff.  She does not recall diastolics.   Past Medical History:  Diagnosis Date   Abnormal EKG approx 2008   Nuclear stress test neg;    Acute pancreatitis 09/2020   idiopathic.  +panc psudocyst   Anxiety    with panic   CAP (community acquired pneumonia) 08/2017   Hospitalization for CAP/acute diast HF/rapid a-fib   Cataract    s/p surgery--lens implants   Chronic diastolic heart failure (HCC) 2019   Chronic renal insufficiency, stage 3 (moderate) (HCC) 12/04/2012   Renal u/s when in hosp 08/2017 for CAP/CHF showed symmetric kidneys, echogenicity normal, w/out hydronephrosis. Baseline GFR around 40 ml/min as of 10/2018.   DDD (degenerative disc disease), lumbar    Diverticulosis 2009   Fracture of radial shaft, left, closed 11/16/2010   fell down flight of stairs   History of kidney stones    Hx of adenomatous colonic polyps 2002;2009;2015   surveillance colonoscopy 2009, +polypectomy done-tubular adenoma w/out high grade.  05/2013 tubular adenomas--recall 3 yrs   Hyperlipidemia    Hypertension    Low TSH level 02/18/2016   T3 norm, T4 mildly elevated--suspected sick euthyroid syndrome.  Repeat labs 06/2016: normal.   Melanoma in situ (HCC) 06/2018   L LL   Osteoarthritis of both knees     viscosupplementation injections helpful 2020/21   Osteopenia    DEXA 08/2010; repeat DEXA 02/2015 worse: fosamax started.  06/2018 Dexa T score -2.4.  2020 maj osteop fx risk = 24%, Hip fx risk 7.3%. 09/2020 T score -2.1   PAF (paroxysmal atrial fibrillation) (HCC) 02/2016   2018.   DCCV x 2 2024.   Peripheral edema    Pneumonia 2015   hx with sepsis   Rheumatic fever     Past Surgical History:  Procedure Laterality Date   APPENDECTOMY  1966   done during surgery for tubal pregnancy   CARDIOVERSION N/A 06/17/2022   Procedure: CARDIOVERSION;  Surgeon: Jodelle Red, MD;  Location: [facility] INVASIVE CV LAB;  Service: Cardiovascular;  Laterality: N/A;   CARDIOVERSION N/A 07/11/2022   Procedure: CARDIOVERSION;  Surgeon: Dolores Patty, MD;  Location: [facility] INVASIVE CV LAB;  Service: Cardiovascular;  Laterality: N/A;   CATARACT EXTRACTION W/ INTRAOCULAR LENS IMPLANT  2013   bilat   COLONOSCOPY W/ POLYPECTOMY  05/2013   +diverticulosis; recall 3 yrs (Dr. Marina Goodell)   DEXA  02/2015; 06/2018   T score -2.1 in both femoral necks; FRAX 10 yr risk of major osteoporotic fracture was 21%---fosamax started. 06/2018 T score -2.4.  T score 09/2020 -2.1. Rpt 2 yrs.   ECTOPIC PREGNANCY SURGERY     EYE SURGERY     LEFT HEART CATH AND CORONARY ANGIOGRAPHY N/A 09/01/2017   No angiographically significant  CAD.  Upper normal left ventricular filling pressure.  Procedure: LEFT HEART CATH AND CORONARY ANGIOGRAPHY;  Surgeon: Yvonne Kendall, MD;  Location: [facility] INVASIVE CV LAB;  Service: Cardiovascular;  Laterality: N/A;   LUMBAR LAMINECTOMY/DECOMPRESSION MICRODISCECTOMY N/A 09/26/2018   Procedure: Decompressive Lumbar Laminectomy L5 S1 FORAMINOTOMY L5 S1  NERVE ROOT BILATERALLY and Microdiscectomy L5-S1 Left;  Surgeon: Ranee Gosselin, MD;  Location: [facility] ORS;  Service: Orthopedics;  Laterality: N/A;    OPEN REDUCTION INTERNAL FIXATION (ORIF) TIBIA/FIBULA FRACTURE Left 02/18/2016   Procedure: OPEN REDUCTION  INTERNAL FIXATION (ORIF) Right ankle trimalleolar fracture;  Surgeon: Toni Arthurs, MD;  Location: [facility] OR;  Service: Orthopedics;  Laterality: Left;  requests   ORIF RADIAL FRACTURE  11/18/2010   left; s/p slip on slippery floor and fell   THORACENTESIS  08/2017   diagnostic and therapeutic.  Transudative.  Clx neg.  (+pulm edema/diastolic HF)   TONSILLECTOMY     TRANSTHORACIC ECHOCARDIOGRAM  02/18/2016; 08/10/17   LVEF of 55-60%, mild AI and mild MR and normal biatrial size.  07/2017--normal LV function, mild enlarge aortic root, mild/mod TR, bilat atrial enlargement. 06/09/22 EF 50-55%, mod MR, aortic root stable enlgmt    Outpatient Medications Prior to Visit  Medication Sig Dispense Refill   acetaminophen (TYLENOL) 650 MG CR tablet Take 1,300 mg by mouth daily.     albuterol (VENTOLIN HFA) 108 (90 Base) MCG/ACT inhaler INHALE 2 PUFFS BY MOUTH into THE lungs EVERY 4 HOURS AS NEEDED FOR WHEEZING OR SHORTNESS OF BREATH (Patient taking differently: Inhale 2 puffs into the lungs See admin instructions. INHALE 2 PUFFS BY MOUTH into THE lungs EVERY 4 HOURS AS NEEDED FOR WHEEZING OR SHORTNESS OF BREATH) 18 g 1   ALPRAZolam (XANAX) 0.5 MG tablet Take 1 tablet (0.5 mg total) by mouth 3 (three) times daily as needed for anxiety. TAKE 1 TABLET BY MOUTH 3 TIMES DAILY AS NEEDED FOR ANXIETY 30 tablet 0   amiodarone (PACERONE) 200 MG tablet Take 1 tablet (200 mg total) by mouth daily. 90 tablet 3   apixaban (ELIQUIS) 5 MG TABS tablet Take 1 tablet (5 mg total) by mouth 2 (two) times daily. 180 tablet 3   Apoaequorin (PREVAGEN PO) Take 1 tablet by mouth daily.     Ascorbic Acid (VITAMIN C PO) Take by mouth daily.     atorvastatin (LIPITOR) 40 MG tablet TAKE 1 TABLET BY MOUTH EVERY DAY 90 tablet 1   Biotin 5000 MCG TABS Take 5,000 mcg by mouth daily.     Calcium Carbonate (CALCIUM 600 PO) Take 1,200 mg by mouth daily.      carboxymethylcellulose (REFRESH PLUS) 0.5 % SOLN Place 2 drops into both eyes daily  as needed (dry/irritated eyes.).     Coenzyme Q10 200 MG capsule Take 200 mg by mouth daily.     empagliflozin (JARDIANCE) 10 MG TABS tablet Take 1 tablet (10 mg total) by mouth daily before breakfast. 90 tablet 3   fluticasone (FLONASE) 50 MCG/ACT nasal spray Place 2 sprays into both nostrils daily.      furosemide (LASIX) 20 MG tablet Take 1 tablet (20 mg total) by mouth daily. 90 tablet 3   hydrocortisone cream 1 % Apply 1 Application topically as needed for itching.     metoprolol tartrate (LOPRESSOR) 100 MG tablet Take 1 tablet (100 mg total) by mouth 2 (two) times daily. 180 tablet 2   Multiple Vitamins-Minerals (CENTRUM SILVER ULTRA WOMENS) TABS Take 1 tablet by mouth every evening.  potassium chloride SA (KLOR-CON M) 20 MEQ tablet Take 1 tablet (20 mEq total) by mouth daily. 90 tablet 3   hydrALAZINE (APRESOLINE) 50 MG tablet 1 and 1/2 tabs po tid     No facility-administered medications prior to visit.    Allergies  Allergen Reactions   Augmentin [Amoxicillin-Pot Clavulanate] Nausea And Vomiting and Other (See Comments)    "projectile vomiting" Has patient had a PCN reaction causing immediate rash, facial/tongue/throat swelling, SOB or lightheadedness with hypotension:No Has patient had a PCN reaction causing severe rash involving mucus membranes or skin necrosis:No Has patient had a PCN reaction that required hospitalization:No Has patient had a PCN reaction occurring within the last 10 years:Yes If all of the above answers are "NO", then may proceed with Cephalosporin use.    Amoxicillin Rash   Clindamycin/Lincomycin Rash    Review of Systems As per HPI  PE:    09/01/2022    3:56 PM 09/01/2022    3:45 PM 08/25/2022   11:23 AM  Vitals with BMI  Weight  176 lbs 6 oz   Systolic 138 162 324  Diastolic 70 84 80  Pulse  59      Physical Exam  Gen: Alert, well appearing.  Patient is oriented to person, place, time, and situation. AFFECT: pleasant, lucid thought and  speech. CV: RRR, no m/r/g.   LUNGS: CTA bilat, nonlabored resps, good aeration in all lung fields. EXT: no clubbing or cyanosis.  No pitting edema.  She does have compression hose on right now.  LABS:  Last metabolic panel Lab Results  Component Value Date   GLUCOSE 90 08/11/2022   NA 142 08/11/2022   K 4.4 08/11/2022   CL 104 08/11/2022   CO2 23 08/11/2022   BUN 25 08/11/2022   CREATININE 1.52 (H) 08/11/2022   EGFR 33 (L) 08/11/2022   CALCIUM 9.3 08/11/2022   PHOS 2.3 (L) 09/18/2020   PROT 6.5 06/11/2022   ALBUMIN 3.5 06/11/2022   LABGLOB 2.4 09/24/2020   AGRATIO 1.5 09/24/2020   BILITOT 0.8 06/11/2022   ALKPHOS 84 06/11/2022   AST 33 06/11/2022   ALT 30 06/11/2022   ANIONGAP 9 07/05/2022   IMPRESSION AND PLAN:  Hypertension, very near good control now.   We will go ahead and maximize her hydralazine to 100 mg 3 times a day.  Continue Lopressor 100 mg twice a day.  An After Visit Summary was printed and given to the patient.  FOLLOW UP: Return in about 3 months (around 12/02/2022) for routine chronic illness f/u.  Signed:  Santiago Bumpers, MD           09/01/2022

## 2022-09-02 DIAGNOSIS — M6281 Muscle weakness (generalized): Secondary | ICD-10-CM | POA: Diagnosis not present

## 2022-09-02 DIAGNOSIS — R2689 Other abnormalities of gait and mobility: Secondary | ICD-10-CM | POA: Diagnosis not present

## 2022-09-06 DIAGNOSIS — R2689 Other abnormalities of gait and mobility: Secondary | ICD-10-CM | POA: Diagnosis not present

## 2022-09-06 DIAGNOSIS — M6281 Muscle weakness (generalized): Secondary | ICD-10-CM | POA: Diagnosis not present

## 2022-09-07 DIAGNOSIS — M6281 Muscle weakness (generalized): Secondary | ICD-10-CM | POA: Diagnosis not present

## 2022-09-07 DIAGNOSIS — R2689 Other abnormalities of gait and mobility: Secondary | ICD-10-CM | POA: Diagnosis not present

## 2022-09-09 DIAGNOSIS — R2689 Other abnormalities of gait and mobility: Secondary | ICD-10-CM | POA: Diagnosis not present

## 2022-09-09 DIAGNOSIS — M6281 Muscle weakness (generalized): Secondary | ICD-10-CM | POA: Diagnosis not present

## 2022-09-11 NOTE — Progress Notes (Unsigned)
Cardiology Clinic Note   Patient Name: Brenda Matthews Date of Encounter: 09/13/2022  Primary Care Provider:  Jeoffrey Massed, MD Primary Cardiologist:  Chrystie Nose, MD  Patient Profile    Brenda Matthews 87 year old female presents to the clinic today for follow-up evaluation of her essential hypertension, acute on chronic diastolic CHF, and paroxysmal atrial fibrillation.  Past Medical History    Past Medical History:  Diagnosis Date   Abnormal EKG approx 2008   Nuclear stress test neg;    Acute pancreatitis 09/2020   idiopathic.  +panc psudocyst   Anxiety    with panic   CAP (community acquired pneumonia) 08/2017   Hospitalization for CAP/acute diast HF/rapid a-fib   Cataract    s/p surgery--lens implants   Chronic diastolic heart failure (HCC) 2019   Chronic renal insufficiency, stage 3 (moderate) (HCC) 12/04/2012   Renal u/s when in hosp 08/2017 for CAP/CHF showed symmetric kidneys, echogenicity normal, w/out hydronephrosis. Baseline GFR around 40 ml/min as of 10/2018.   DDD (degenerative disc disease), lumbar    Diverticulosis 2009   Fracture of radial shaft, left, closed 11/16/2010   fell down flight of stairs   History of kidney stones    Hx of adenomatous colonic polyps 2002;2009;2015   surveillance colonoscopy 2009, +polypectomy done-tubular adenoma w/out high grade.  05/2013 tubular adenomas--recall 3 yrs   Hyperlipidemia    Hypertension    Low TSH level 02/18/2016   T3 norm, T4 mildly elevated--suspected sick euthyroid syndrome.  Repeat labs 06/2016: normal.   Melanoma in situ (HCC) 06/2018   L LL   Osteoarthritis of both knees    viscosupplementation injections helpful 2020/21   Osteopenia    DEXA 08/2010; repeat DEXA 02/2015 worse: fosamax started.  06/2018 Dexa T score -2.4.  2020 maj osteop fx risk = 24%, Hip fx risk 7.3%. 09/2020 T score -2.1   PAF (paroxysmal atrial fibrillation) (HCC) 02/2016   2018.   DCCV x 2 2024.   Peripheral edema    Pneumonia  2015   hx with sepsis   Rheumatic fever    Past Surgical History:  Procedure Laterality Date   APPENDECTOMY  1966   done during surgery for tubal pregnancy   CARDIOVERSION N/A 06/17/2022   Procedure: CARDIOVERSION;  Surgeon: Jodelle Red, MD;  Location: [facility] INVASIVE CV LAB;  Service: Cardiovascular;  Laterality: N/A;   CARDIOVERSION N/A 07/11/2022   Procedure: CARDIOVERSION;  Surgeon: Dolores Patty, MD;  Location: [facility] INVASIVE CV LAB;  Service: Cardiovascular;  Laterality: N/A;   CATARACT EXTRACTION W/ INTRAOCULAR LENS IMPLANT  2013   bilat   COLONOSCOPY W/ POLYPECTOMY  05/2013   +diverticulosis; recall 3 yrs (Dr. Marina Goodell)   DEXA  02/2015; 06/2018   T score -2.1 in both femoral necks; FRAX 10 yr risk of major osteoporotic fracture was 21%---fosamax started. 06/2018 T score -2.4.  T score 09/2020 -2.1. Rpt 2 yrs.   ECTOPIC PREGNANCY SURGERY     EYE SURGERY     LEFT HEART CATH AND CORONARY ANGIOGRAPHY N/A 09/01/2017   No angiographically significant CAD.  Upper normal left ventricular filling pressure.  Procedure: LEFT HEART CATH AND CORONARY ANGIOGRAPHY;  Surgeon: Yvonne Kendall, MD;  Location: [facility] INVASIVE CV LAB;  Service: Cardiovascular;  Laterality: N/A;   LUMBAR LAMINECTOMY/DECOMPRESSION MICRODISCECTOMY N/A 09/26/2018   Procedure: Decompressive Lumbar Laminectomy L5 S1 FORAMINOTOMY L5 S1  NERVE ROOT BILATERALLY and Microdiscectomy L5-S1 Left;  Surgeon: Ranee Gosselin, MD;  Location: [facility] ORS;  Service: Orthopedics;  Laterality: N/A;    OPEN REDUCTION INTERNAL FIXATION (ORIF) TIBIA/FIBULA FRACTURE Left 02/18/2016   Procedure: OPEN REDUCTION INTERNAL FIXATION (ORIF) Right ankle trimalleolar fracture;  Surgeon: Toni Arthurs, MD;  Location: [facility] OR;  Service: Orthopedics;  Laterality: Left;  requests   ORIF RADIAL FRACTURE  11/18/2010   left; s/p slip on slippery floor and fell   THORACENTESIS  08/2017   diagnostic and therapeutic.  Transudative.  Clx neg.  (+pulm  edema/diastolic HF)   TONSILLECTOMY     TRANSTHORACIC ECHOCARDIOGRAM  02/18/2016; 08/10/17   LVEF of 55-60%, mild AI and mild MR and normal biatrial size.  07/2017--normal LV function, mild enlarge aortic root, mild/mod TR, bilat atrial enlargement. 06/09/22 EF 50-55%, mod MR, aortic root stable enlgmt    Allergies  Allergies  Allergen Reactions   Augmentin [Amoxicillin-Pot Clavulanate] Nausea And Vomiting and Other (See Comments)    "projectile vomiting" Has patient had a PCN reaction causing immediate rash, facial/tongue/throat swelling, SOB or lightheadedness with hypotension:No Has patient had a PCN reaction causing severe rash involving mucus membranes or skin necrosis:No Has patient had a PCN reaction that required hospitalization:No Has patient had a PCN reaction occurring within the last 10 years:Yes If all of the above answers are "NO", then may proceed with Cephalosporin use.    Amoxicillin Rash   Clindamycin/Lincomycin Rash    History of Present Illness    Brenda Matthews has a PMH of HTN, PAF, chronic diastolic CHF, hyperlipidemia, anemia, anxiety, insomnia, DOE, status post thoracentesis, and edema.  She underwent cardiac catheterization 8/19 which showed normal coronaries.  She was seen by Dr. Rennis Golden 2/18 for new onset atrial fibrillation with RVR after induction with anesthesia for ankle fracture.  Her echocardiogram showed normal LV function.  She was initially placed on low-dose beta-blocker and Xarelto.  She was taken off beta-blocker due to low blood pressure and converted back to sinus rhythm.  CHA2DS2-VASc score 3.  She was advised to continue Xarelto.    She had a recurrent episode of atrial fibrillation with RVR 7/19.  Her echocardiogram at that time showed an LV function of 50-55%, mild MR, mild-moderate TR.  She was started on amiodarone and outpatient DCCV was scheduled.  On presentation for DCCV she was found to be in sinus rhythm.  She was seen in follow-up by Dr.  Rennis Golden on 5/21.  During that time she continued to do well from cardiac standpoint.  Her amiodarone was discontinued.  She was seen in follow-up by Marjie Skiff, PA-C on 09/24/2020.  At that time she reported that she had been admitted 09/14/2020 until 09/19/2020 with an acute episode of pancreatitis and pancreatic pseudocyst.  GI was consulted and felt conservative management was her best option.  She was also treated for acute UTI.  She had no cardiac issues during her admission.  She was [facility] to [facility].  On follow-up she continues to do well from a cardiac standpoint.  She reported that during her hospitalization she received IV fluids and had significant lower extremity swelling.  Her left leg was noted to be chronically larger than her right.  She was elevating her legs and using compression stockings.  She noted occasional shortness of breath that would improve with inhaler use.  She denied orthopnea and PND.  She occasionally noted palpitations which would last for 2-3 minutes and resolved.  She denied lightheadedness, dizziness and syncope.  She presented to the clinic 09/06/21 for follow-up evaluation stated she  feels well today.  She occasionally noticed episodes of dizziness.  They dissipated without intervention.  She also noticed some mild increased work of breathing with walking long distances.  She did not notice increased shortness of breath with normal activities.  She had been going to the gym 3 times per week at [facility].  She has noticed some loose stools and will be following up with GI related to her pancreatitis.  Her ekg showed normal sinus rhythm 62 bpm.  I planned follow-up in 6 months.  She has a son that lives in [place].  She was seen in follow-up by Wallis Bamberg, NP-C 04/07/2022.  She was noted to be fluid volume overloaded.  Her furosemide was increased x 2 days.  Her lab work remained stable.  Follow-up was planned for 2 weeks.  She presented to the clinic  04/28/22 for follow-up evaluation and stated she was concerned about her weight that morning.  She had been weighing herself at home and reported that her weight had been stable.  She was doing physical therapy.  She denied shortness of breath at rest.  She did note dyspnea on exertion.  We reviewed her recent lab work and she expressed understanding.  We reviewed the importance of avoiding salt and fluid restriction.  She had an upcoming echocardiogram.    I doubled her furosemide x 3 days as well as her potassium for 3 days.  I will ordered a BMP in 1 week and planned follow-up in 1 month.  She was admitted to the hospital on 06/11/2022 with progressive shortness of breath.  She received IV diuresis.  She was discharged in stable condition on 06/24/2022.  She was seen in follow-up by Tonye Becket, NP on 07/05/2022.  She reported fatigue and shortness of breath with exertion.  Current weight was noted to be 176 pounds.  Her EKG at that time showed atrial fibrillation with rate of 105 bpm.  Her amiodarone was increased to 400 mg twice daily and her apixaban was continued.  07/11/2022 she underwent successful DCCV with biphasic shock at 200 J.     She presented to the clinic 08/04/22 for follow-up evaluation and stated she was feeling much better.  She reported that she did have some dietary indiscretion.  We reviewed her weight which was 175.8 pounds.  It was stable.  Her recent lab work from rehab facility showed an elevated creatinine of 1.71.  We reviewed her medications.  She had been taking them differently.  She had been taking Lasix 20 mg daily and potassium 20 mill equivalents twice daily.  I decreased her amiodarone to 200 mg daily.  I  repeated a BMP and plan for again in 1 week.  We discussed the importance of continuing low-sodium diet and fluid restriction.  She expressed understanding.  Follow-up in 1 to 2 months was planned.  Based off of her lab work I plan to add SLG 2 inhibitor Farxiga 10 mg daily).   Follow-up lab work 08/11/2022 showed creatinine of 1.52 elevated from 1.31.  This was an expected increase due to addition of Jardiance.  I continued her medication regimen.  She presents to the clinic today for follow-up evaluation and states she has been watching her fluid and salt intake. She is using her watch to monitor for afib. She has also been monitoring her blood pressure. Her blood pressure today is 136/64. She reports that it has been in the 130's at home. I will continue her current  medications, diet, fluid restriction and plan f/u in 6 months    Today she denies chest pain,  lower extremity edema, fatigue, palpitations, melena, hematuria, hemoptysis, diaphoresis, weakness, presyncope, syncope, orthopnea, and PND.     Home Medications    Prior to Admission medications   Medication Sig Start Date End Date Taking? Authorizing Provider  acetaminophen (TYLENOL) 650 MG CR tablet Take 650 mg by mouth in the morning and at bedtime. Patient not taking: Reported on 05/06/2021    [provider]  albuterol (VENTOLIN HFA) 108 (90 Base) MCG/ACT inhaler INHALE 2 PUFFS BY MOUTH into THE lungs EVERY 4 HOURS AS NEEDED FOR WHEEZING OR SHORTNESS OF BREATH 08/25/21   McGowen, Maryjean Morn, MD  ALPRAZolam Prudy Feeler) 0.5 MG tablet 1 tab po tid prn anxiety 04/05/21   McGowen, Maryjean Morn, MD  amLODipine (NORVASC) 10 MG tablet TAKE 1 TABLET BY MOUTH EVERY DAY 07/27/21   McGowen, Maryjean Morn, MD  Apoaequorin (PREVAGEN PO) Take 1 tablet by mouth daily.    [provider]  Ascorbic Acid (VITAMIN C PO) Take by mouth daily.    [provider]  atorvastatin (LIPITOR) 40 MG tablet TAKE 1 TABLET BY MOUTH EVERY DAY 07/27/21   McGowen, Maryjean Morn, MD  Biotin 5000 MCG TABS Take 5,000 mcg by mouth daily.    [provider]  Calcium Carbonate (CALCIUM 600 PO) Take 1,200 mg by mouth daily.     [provider]  carboxymethylcellulose (REFRESH PLUS) 0.5 % SOLN Place 2 drops into both eyes daily  as needed (dry/irritated eyes.). Patient not taking: Reported on 05/06/2021    [provider]  Coenzyme Q10 200 MG capsule Take 200 mg by mouth daily.    [provider]  fluticasone (FLONASE) 50 MCG/ACT nasal spray Place 2 sprays into both nostrils daily.     [provider]  furosemide (LASIX) 40 MG tablet Take 1 tablet (40 mg total) by mouth daily. 08/27/20   McGowen, Maryjean Morn, MD  hydrALAZINE (APRESOLINE) 50 MG tablet TAKE 1 TABLET BY MOUTH 3 TIMES DAILY 07/27/21   McGowen, Maryjean Morn, MD  metoprolol tartrate (LOPRESSOR) 100 MG tablet TAKE 1 TABLET BY MOUTH 2 TIMES DAILY 04/28/21   Hilty, Lisette Abu, MD  Multiple Vitamins-Minerals (CENTRUM SILVER ULTRA WOMENS) TABS Take 1 tablet by mouth every evening.    [provider]  potassium chloride SA (KLOR-CON) 20 MEQ tablet TAKE 1 TABLET BY MOUTH 2 TIMES DAILY 10/14/20   Hilty, Lisette Abu, MD  Rivaroxaban (XARELTO) 15 MG TABS tablet TAKE 1 TABLET BY MOUTH EVERY DAY AT [facility] 08/03/21   Hilty, Lisette Abu, MD    Family History    Family History  Problem Relation Age of Onset   Heart disease Mother    Heart disease Father    Hypertension Brother    Diabetes Sister    Colon cancer Neg Hx    Pancreatic cancer Neg Hx    Rectal cancer Neg Hx    Stomach cancer Neg Hx    She indicated that her mother is deceased. She indicated that her father is deceased. She indicated that her sister is deceased. She indicated that the status of her brother is unknown. She indicated that the status of her neg hx is unknown.  Social History    Social History   Socioeconomic History   Marital status: Widowed    Spouse name: Not on file   Number of children: Not on file   Years of education:  Not on file   Highest education level: Not on file  Occupational History   Not on file  Tobacco Use   Smoking status: Never   Smokeless tobacco: Never  Vaping Use   Vaping status: Never Used  Substance and Sexual Activity   Alcohol use: Yes     Comment: rarely   Drug use: Never   Sexual activity: Not on file  Other Topics Concern   Not on file  Social History Narrative   Widow, 2 sons.   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   No tobacco.  Rare alcohol.   No drugs.  Exercise: 4 times per week, about 4mi.   Social Determinants of Health   Financial Resource Strain: Low Risk  (10/27/2021)   Overall Financial Resource Strain (CARDIA)    Difficulty of Paying Living Expenses: Not hard at all  Food Insecurity: No Food Insecurity (06/11/2022)   Hunger Vital Sign    Worried About Running Out of Food in the Last Year: Never true    Ran Out of Food in the Last Year: Never true  Transportation Needs: No Transportation Needs (06/11/2022)   PRAPARE - Administrator, Civil Service (Medical): No    Lack of Transportation (Non-Medical): No  Physical Activity: Sufficiently Active (10/27/2021)   Exercise Vital Sign    Days of Exercise per Week: 5 days    Minutes of Exercise per Session: 30 min  Stress: No Stress Concern Present (10/27/2021)   [employer] of Occupational Health - Occupational Stress Questionnaire    Feeling of Stress : Not at all  Social Connections: Moderately Integrated (10/27/2021)   Social Connection and Isolation Panel [NHANES]    Frequency of Communication with Friends and Family: More than three times a week    Frequency of Social Gatherings with Friends and Family: More than three times a week    Attends Religious Services: More than 4 times per year    Active Member of [employer] or Organizations: Yes    Attends Banker Meetings: More than 4 times per year    Marital Status: Widowed  Intimate Partner Violence: Not At Risk (06/11/2022)   Humiliation, Afraid, Rape, and Kick questionnaire    Fear of Current or Ex-Partner: No    Emotionally Abused: No    Physically Abused: No    Sexually Abused: No     Review of Systems    General:  No chills, fever, night sweats or weight changes.   Cardiovascular:  No chest pain, dyspnea on exertion, edema, orthopnea, palpitations, paroxysmal nocturnal dyspnea. Dermatological: No rash, lesions/masses Respiratory: No cough, dyspnea Urologic: No hematuria, dysuria Abdominal:   No nausea, vomiting, diarrhea, bright red blood per rectum, melena, or hematemesis Neurologic:  No visual changes, wkns, changes in mental status. All other systems reviewed and are otherwise negative except as noted above.  Physical Exam    VS:  BP 136/64   Pulse 60   Ht 5\' 1"  (1.549 m)   Wt 176 lb (79.8 kg)   LMP  (LMP Unknown)   SpO2 95%   BMI 33.25 kg/m  , BMI Body mass index is 33.25 kg/m. GEN: Well nourished, well developed, in no acute distress. HEENT: normal. Neck: Supple, no JVD, carotid bruits, or masses. Cardiac: RRR, no murmurs, rubs, or gallops. No clubbing, cyanosis, no edema.  Radials/DP/PT 2+ and equal bilaterally.  Respiratory:  Respirations regular and unlabored, clear to auscultation bilaterally. GI: Soft, nontender, nondistended, BS + x 4. MS: no  deformity or atrophy. Skin: warm and dry, no rash. Neuro:  Strength and sensation are intact. Psych: Normal affect.  Accessory Clinical Findings    Recent Labs: 06/11/2022: ALT 30 06/24/2022: Magnesium 2.2 07/05/2022: B Natriuretic Peptide 1,272.1; Hemoglobin 12.8; Platelets 189 08/11/2022: BUN 25; Creatinine, Ser 1.52; Potassium 4.4; Sodium 142   Recent Lipid Panel    Component Value Date/Time   CHOL 144 01/05/2022 1001   TRIG 113.0 01/05/2022 1001   HDL 60.90 01/05/2022 1001   CHOLHDL 2 01/05/2022 1001   VLDL 22.6 01/05/2022 1001   LDLCALC 61 01/05/2022 1001   LDLDIRECT 66.0 09/10/2018 0913    ECG personally reviewed by me today- none today   EKG 07/11/22 sinus rhythm anterior infarct undetermined age 72 bpm.  EKG 09/06/2021 normal sinus rhythm left axis deviation anterior infarct undetermined age 70 bpm- No acute changes  Echocardiogram 08/10/2017  Study Conclusions    - Left ventricle: The cavity size was normal. Wall thickness was    increased in a pattern of mild LVH. Systolic function was normal.    The estimated ejection fraction was in the range of 50% to 55%.    Wall motion was normal; there were no regional wall motion    abnormalities.  - Aortic valve: There was trivial regurgitation.  - Aortic root: The aortic root was mildly dilated.  - Mitral valve: There was mild regurgitation.  - Left atrium: The atrium was severely dilated.  - Right atrium: The atrium was mildly dilated.  - Tricuspid valve: There was mild-moderate regurgitation.  - Pulmonary arteries: PA peak pressure: 32 mm Hg (S).  - Pericardium, extracardiac: A trivial pericardial effusion was    identified.   Impressions:   - Normal LV systolic function; mild LVH; trace AI; mildly dilated    aortic root; mild MR; biatrial enlargement; mild to moderate TR.  Echocardiogram 06/09/2022  IMPRESSIONS     1. Left ventricular ejection fraction, by estimation, is 50 to 55%. The  left ventricle has low normal function. Left ventricular endocardial  border not optimally defined to evaluate regional wall motion. There is  mild concentric left ventricular  hypertrophy. Left ventricular diastolic parameters are indeterminate.   2. Right ventricular systolic function is normal. The right ventricular  size is normal.   3. Left atrial size was moderately dilated.   4. Right atrial size was mildly dilated.   5. The mitral valve is grossly normal. Mild to moderate mitral valve  regurgitation.   6. The aortic valve is tricuspid. There is mild calcification of the  aortic valve. There is mild thickening of the aortic valve. Aortic valve  regurgitation is mild. Aortic valve sclerosis/calcification is present,  without any evidence of aortic  stenosis.   7. Aortic dilatation noted. There is mild dilatation of the ascending  aorta, measuring 41 mm.   Comparison(s): No significant change  from prior study.   FINDINGS   Left Ventricle: Left ventricular ejection fraction, by estimation, is 50  to 55%. The left ventricle has low normal function. Left ventricular  endocardial border not optimally defined to evaluate regional wall motion.  The left ventricular internal cavity   size was normal in size. There is mild concentric left ventricular  hypertrophy. Left ventricular diastolic parameters are indeterminate.   Right Ventricle: The right ventricular size is normal. No increase in  right ventricular wall thickness. Right ventricular systolic function is  normal.   Left Atrium: Left atrial size was moderately dilated.   Right Atrium:  Right atrial size was mildly dilated.   Pericardium: There is no evidence of pericardial effusion.   Mitral Valve: The mitral valve is grossly normal. Mild to moderate mitral  valve regurgitation.   Tricuspid Valve: The tricuspid valve is normal in structure. Tricuspid  valve regurgitation is mild.   Aortic Valve: The aortic valve is tricuspid. There is mild calcification  of the aortic valve. There is mild thickening of the aortic valve. Aortic  valve regurgitation is mild. Aortic valve sclerosis/calcification is  present, without any evidence of  aortic stenosis.   Pulmonic Valve: The pulmonic valve was normal in structure. Pulmonic valve  regurgitation is trivial.   Aorta: Aortic dilatation noted. There is mild dilatation of the ascending  aorta, measuring 41 mm.   IAS/Shunts: The atrial septum is grossly normal.   Assessment & Plan   Chronic diastolic CHF-euvolemic.  No increased DOE or activity intolerance.  Weight today 176 lbs.   Left leg chronically larger than right.  Renal function noted to be 1.71 on 07/26/2022.  Creatinine 1.52 on 08/11/2022 Continue hydralazine, metoprolol, furosemide, potassium, Jardiance Heart healthy low-sodium diet Maintain physical activity as tolerated-continues to do group exercise at [facility]. Daily weights Continue lower extremity support stockings Fluid restriction less than 40 ounces daily  Paroxysmal atrial fibrillation-heart rate today 60 .  Underwent successful DCCV 07/11/2022.  Beta-blocker previously stopped due to low blood pressure chest.  CHA2DS2-VASc score 5.  Denies bleeding issues. Monitor with her watch Avoid triggers-reviewed Continue Eliquis, metoprolol, amiodarone Heart healthy low-sodium diet   Hypertension-BP today 136/64.   Continue metoprolol, furosemide, hydralizine   Heart healthy low-sodium diet-reviewed Maintain blood pressure log  CKD stage III-creatinine 1.52 on 08/11/22. Follows with PCP  Disposition: Follow-up with Dr. Rennis Golden or me in 4-6 months.  Thomasene Ripple. Joleah Kosak NP-C     09/13/2022, 9:26 AM [facility] [address] Office [phone] Fax [phone]  Notice: This dictation was prepared with Dragon dictation along with smaller phrase technology. Any transcriptional errors that result from this process are unintentional and may not be corrected upon review.  I spent 13 minutes examining this patient, reviewing medications, and using patient centered shared decision making involving her cardiac care.  Prior to her visit I spent greater than 20 minutes reviewing her past medical history,  medications, and prior cardiac tests.

## 2022-09-12 DIAGNOSIS — M6281 Muscle weakness (generalized): Secondary | ICD-10-CM | POA: Diagnosis not present

## 2022-09-12 DIAGNOSIS — R2689 Other abnormalities of gait and mobility: Secondary | ICD-10-CM | POA: Diagnosis not present

## 2022-09-13 ENCOUNTER — Encounter: Payer: Self-pay | Admitting: General Practice

## 2022-09-13 ENCOUNTER — Ambulatory Visit: Payer: Medicare PPO | Attending: General Practice | Admitting: General Practice

## 2022-09-13 VITALS — BP 136/64 | HR 60 | Ht 61.0 in | Wt 176.0 lb

## 2022-09-13 DIAGNOSIS — N183 Chronic kidney disease, stage 3 unspecified: Secondary | ICD-10-CM

## 2022-09-13 DIAGNOSIS — I10 Essential (primary) hypertension: Secondary | ICD-10-CM

## 2022-09-13 DIAGNOSIS — I480 Paroxysmal atrial fibrillation: Secondary | ICD-10-CM | POA: Diagnosis not present

## 2022-09-13 DIAGNOSIS — I5032 Chronic diastolic (congestive) heart failure: Secondary | ICD-10-CM

## 2022-09-13 NOTE — Patient Instructions (Signed)
Medication Instructions:  The current medical regimen is effective;  continue present plan and medications as directed. Please refer to the Current Medication list given to you today.  *If you need a refill on your cardiac medications before your next appointment, please call your pharmacy*  Lab Work: NONE If you have labs (blood work) drawn today and your tests are completely normal, you will receive your results only by: MyChart Message (if you have MyChart) OR A paper copy in the mail If you have any lab test that is abnormal or we need to change your treatment, we will call you to review the results.  Other Instructions CONTINUE YOUR DAILY WEIGHTS FLUID RESTRICTION 40 OUNCES DAILY  PLEASE READ AND FOLLOW ATTACHED LOW SALT DIET  Follow-Up: At [facility], you and your health needs are our priority.  As part of our continuing mission to provide you with exceptional heart care, we have created designated Provider Care Teams.  These Care Teams include your primary Cardiologist (physician) and Advanced Practice Providers (APPs -  Physician Assistants and Nurse Practitioners) who all work together to provide you with the care you need, when you need it.  Your next appointment:   6 month(s)  Provider:   Chrystie Nose, MD      LOW SALT DIET-DASH Eating Plan DASH stands for Dietary Approaches to Stop Hypertension. The DASH eating plan is a healthy eating plan that has been shown to: Lower high blood pressure (hypertension). Reduce your risk for type 2 diabetes, heart disease, and stroke. Help with weight loss. What are tips for following this plan? Reading food labels Check food labels for the amount of salt (sodium) per serving. Choose foods with less than 5 percent of the Daily Value (DV) of sodium. In general, foods with less than 300 milligrams (mg) of sodium per serving fit into this eating plan. To find whole grains, look for the word "whole" as the first word in the  ingredient list. Shopping Buy products labeled as "low-sodium" or "no salt added." Buy fresh foods. Avoid canned foods and pre-made or frozen meals. Cooking Try not to add salt when you [place]. Use salt-free seasonings or herbs instead of table salt or sea salt. Check with your health care provider or pharmacist before using salt substitutes. Do not fry foods. [place] foods in healthy ways, such as baking, boiling, grilling, roasting, or broiling. Rice using oils that are good for your heart. These include olive, canola, avocado, soybean, and sunflower oil. Meal planning  Eat a balanced diet. This should include: 4 or more servings of fruits and 4 or more servings of vegetables each day. Try to fill half of your plate with fruits and vegetables. 6-8 servings of whole grains each day. 6 or less servings of lean meat, poultry, or fish each day. 1 oz is 1 serving. A 3 oz (85 g) serving of meat is about the same size as the palm of your hand. One egg is 1 oz (28 g). 2-3 servings of low-fat dairy each day. One serving is 1 cup (237 mL). 1 serving of nuts, seeds, or beans 5 times each week. 2-3 servings of heart-healthy fats. Healthy fats called omega-3 fatty acids are found in foods such as walnuts, flaxseeds, fortified milks, and eggs. These fats are also found in cold-water fish, such as sardines, salmon, and mackerel. Limit how much you eat of: Canned or prepackaged foods. Food that is high in trans fat, such as fried foods. Food that  is high in saturated fat, such as fatty meat. Desserts and other sweets, sugary drinks, and other foods with added sugar. Full-fat dairy products. Do not salt foods before eating. Do not eat more than 4 egg yolks a week. Try to eat at least 2 vegetarian meals a week. Eat more home-cooked food and less restaurant, buffet, and fast food. Lifestyle When eating at a restaurant, ask if your food can be made with less salt or no salt. If you drink alcohol: Limit how  much you have to: 0-1 drink a day if you are female. 0-2 drinks a day if you are female. Know how much alcohol is in your drink. In the [place] one drink is one 12 oz bottle of beer (355 mL), one 5 oz glass of wine (148 mL), or one 1 oz glass of hard liquor (44 mL). General information Avoid eating more than 2,300 mg of salt a day. If you have hypertension, you may need to reduce your sodium intake to 1,500 mg a day. Work with your provider to stay at a healthy body weight or lose weight. Ask what the best weight range is for you. On most days of the week, get at least 30 minutes of exercise that causes your heart to beat faster. This may include walking, swimming, or biking. Work with your provider or dietitian to adjust your eating plan to meet your specific calorie needs. What foods should I eat? Fruits All fresh, dried, or frozen fruit. Canned fruits that are in their natural juice and do not have sugar added to them. Vegetables Fresh or frozen vegetables that are raw, steamed, roasted, or grilled. Low-sodium or reduced-sodium tomato and vegetable juice. Low-sodium or reduced-sodium tomato sauce and tomato paste. Low-sodium or reduced-sodium canned vegetables. Grains Whole-grain or whole-wheat bread. Whole-grain or whole-wheat pasta. Brown rice. Orpah Cobb. Bulgur. Whole-grain and low-sodium cereals. Pita bread. Low-fat, low-sodium crackers. Whole-wheat flour tortillas. Meats and other proteins Skinless chicken or [place]. Ground chicken or [place]. Pork with fat trimmed off. Fish and seafood. Egg whites. Dried beans, peas, or lentils. Unsalted nuts, nut butters, and seeds. Unsalted canned beans. Lean cuts of beef with fat trimmed off. Low-sodium, lean precooked or cured meat, such as sausages or meat loaves. Dairy Low-fat (1%) or fat-free (skim) milk. Reduced-fat, low-fat, or fat-free cheeses. Nonfat, low-sodium ricotta or cottage cheese. Low-fat or nonfat yogurt. Low-fat, low-sodium  cheese. Fats and oils Soft margarine without trans fats. Vegetable oil. Reduced-fat, low-fat, or light mayonnaise and salad dressings (reduced-sodium). Canola, safflower, olive, avocado, soybean, and sunflower oils. Avocado. Seasonings and condiments Herbs. Spices. Seasoning mixes without salt. Other foods Unsalted popcorn and pretzels. Fat-free sweets. The items listed above may not be all the foods and drinks you can have. Talk to a dietitian to learn more. What foods should I avoid? Fruits Canned fruit in a light or heavy syrup. Fried fruit. Fruit in cream or butter sauce. Vegetables Creamed or fried vegetables. Vegetables in a cheese sauce. Regular canned vegetables that are not marked as low-sodium or reduced-sodium. Regular canned tomato sauce and paste that are not marked as low-sodium or reduced-sodium. Regular tomato and vegetable juices that are not marked as low-sodium or reduced-sodium. Rosita Fire. Olives. Grains Baked goods made with fat, such as croissants, muffins, or some breads. Dry pasta or rice meal packs. Meats and other proteins Fatty cuts of meat. Ribs. Fried meat. Tomasa Blase. Bologna, salami, and other precooked or cured meats, such as sausages or meat loaves, that are not lean and  low in sodium. Fat from the back of a pig (fatback). Bratwurst. Salted nuts and seeds. Canned beans with added salt. Canned or smoked fish. Whole eggs or egg yolks. Chicken or [place] with skin. Dairy Whole or 2% milk, cream, and half-and-half. Whole or full-fat cream cheese. Whole-fat or sweetened yogurt. Full-fat cheese. Nondairy creamers. Whipped toppings. Processed cheese and cheese spreads. Fats and oils Butter. Stick margarine. Lard. Shortening. Ghee. Bacon fat. Tropical oils, such as coconut, palm kernel, or palm oil. Seasonings and condiments Onion salt, garlic salt, seasoned salt, table salt, and sea salt. Worcestershire sauce. Tartar sauce. Barbecue sauce. Teriyaki sauce. Soy sauce, including  reduced-sodium soy sauce. Steak sauce. Canned and packaged gravies. Fish sauce. Oyster sauce. Cocktail sauce. Store-bought horseradish. Ketchup. Mustard. Meat flavorings and tenderizers. Bouillon cubes. Hot sauces. Pre-made or packaged marinades. Pre-made or packaged taco seasonings. Relishes. Regular salad dressings. Other foods Salted popcorn and pretzels. The items listed above may not be all the foods and drinks you should avoid. Talk to a dietitian to learn more. Where to find more information National Heart, Lung, and Blood Institute (NHLBI): BuffaloDryCleaner.gl American Heart Association (AHA): heart.org Academy of Nutrition and Dietetics: eatright.org National Kidney Foundation (NKF): kidney.org This information is not intended to replace advice given to you by your health care provider. Make sure you discuss any questions you have with your health care provider. Document Revised: 01/20/2022 Document Reviewed: 01/20/2022 Elsevier Patient Education  2024 [employer].

## 2022-09-14 DIAGNOSIS — M6281 Muscle weakness (generalized): Secondary | ICD-10-CM | POA: Diagnosis not present

## 2022-09-14 DIAGNOSIS — R2689 Other abnormalities of gait and mobility: Secondary | ICD-10-CM | POA: Diagnosis not present

## 2022-09-19 DIAGNOSIS — R2689 Other abnormalities of gait and mobility: Secondary | ICD-10-CM | POA: Diagnosis not present

## 2022-09-19 DIAGNOSIS — M6281 Muscle weakness (generalized): Secondary | ICD-10-CM | POA: Diagnosis not present

## 2022-09-20 DIAGNOSIS — M170 Bilateral primary osteoarthritis of knee: Secondary | ICD-10-CM | POA: Diagnosis not present

## 2022-09-21 DIAGNOSIS — M6281 Muscle weakness (generalized): Secondary | ICD-10-CM | POA: Diagnosis not present

## 2022-09-21 DIAGNOSIS — R2689 Other abnormalities of gait and mobility: Secondary | ICD-10-CM | POA: Diagnosis not present

## 2022-09-26 DIAGNOSIS — R2689 Other abnormalities of gait and mobility: Secondary | ICD-10-CM | POA: Diagnosis not present

## 2022-09-26 DIAGNOSIS — M6281 Muscle weakness (generalized): Secondary | ICD-10-CM | POA: Diagnosis not present

## 2022-09-28 DIAGNOSIS — R2689 Other abnormalities of gait and mobility: Secondary | ICD-10-CM | POA: Diagnosis not present

## 2022-09-28 DIAGNOSIS — M6281 Muscle weakness (generalized): Secondary | ICD-10-CM | POA: Diagnosis not present

## 2022-09-30 DIAGNOSIS — R2689 Other abnormalities of gait and mobility: Secondary | ICD-10-CM | POA: Diagnosis not present

## 2022-09-30 DIAGNOSIS — M6281 Muscle weakness (generalized): Secondary | ICD-10-CM | POA: Diagnosis not present

## 2022-10-03 DIAGNOSIS — R2689 Other abnormalities of gait and mobility: Secondary | ICD-10-CM | POA: Diagnosis not present

## 2022-10-03 DIAGNOSIS — M6281 Muscle weakness (generalized): Secondary | ICD-10-CM | POA: Diagnosis not present

## 2022-10-05 DIAGNOSIS — M6281 Muscle weakness (generalized): Secondary | ICD-10-CM | POA: Diagnosis not present

## 2022-10-05 DIAGNOSIS — R2689 Other abnormalities of gait and mobility: Secondary | ICD-10-CM | POA: Diagnosis not present

## 2022-10-10 DIAGNOSIS — M6281 Muscle weakness (generalized): Secondary | ICD-10-CM | POA: Diagnosis not present

## 2022-10-10 DIAGNOSIS — R2689 Other abnormalities of gait and mobility: Secondary | ICD-10-CM | POA: Diagnosis not present

## 2022-10-11 ENCOUNTER — Other Ambulatory Visit: Payer: Self-pay

## 2022-10-11 ENCOUNTER — Other Ambulatory Visit: Payer: Self-pay | Admitting: Internal Medicine

## 2022-10-11 MED ORDER — APIXABAN 5 MG PO TABS: 5.0000 mg | ORAL_TABLET | Freq: Two times a day (BID) | ORAL | 5 refills | Status: DC

## 2022-10-11 NOTE — Telephone Encounter (Signed)
Prescription refill request for Eliquis received. Indication:AFIB Last office visit:8/24 Scr:1.52  7/24 Age: 87 Weight:79.8  kg  Prescription refilled

## 2022-10-12 ENCOUNTER — Telehealth: Payer: Self-pay | Admitting: Family Medicine

## 2022-10-12 ENCOUNTER — Other Ambulatory Visit: Payer: Self-pay

## 2022-10-12 DIAGNOSIS — M6281 Muscle weakness (generalized): Secondary | ICD-10-CM | POA: Diagnosis not present

## 2022-10-12 DIAGNOSIS — R2689 Other abnormalities of gait and mobility: Secondary | ICD-10-CM | POA: Diagnosis not present

## 2022-10-12 MED ORDER — ALPRAZOLAM 0.5 MG PO TABS: 0.5000 mg | ORAL_TABLET | Freq: Three times a day (TID) | ORAL | 5 refills | Status: DC | PRN

## 2022-10-12 MED ORDER — ALBUTEROL SULFATE HFA 108 (90 BASE) MCG/ACT IN AERS: INHALATION_SPRAY | RESPIRATORY_TRACT | 1 refills | Status: DC

## 2022-10-12 NOTE — Telephone Encounter (Signed)
Called pt to confirm she is taking Eliquis, no answer. Left message on voicemail with call back number.

## 2022-10-12 NOTE — Telephone Encounter (Signed)
At 136pm, spoke with [facility] and advised eliquis rx was sent to [facility] mail order when the change occurred and pt is supposed to be on eliquis. They have the eliquis rx on hand and still have the xarelto on profile and states will update.   04/07/22 OV note states: STOP XARELTO WHEN FINISHED AND START ELIQUIS 5MG  TWICE DAILY

## 2022-10-12 NOTE — Telephone Encounter (Signed)
LVM and sent MyChart.

## 2022-10-12 NOTE — Telephone Encounter (Addendum)
This is not a refill request, however, the pharmacy is CONFIRMING THAT PT IS SWITCHING FROM XARELTO BACK TO ELIQUIS.  Dx-Afib Last OV 09/13/22 Eliquis 5mg  sent on 10/11/22 Xarelto 15mg  sent 01/07/22 Last OV note states continue eliquis & 04/07/22 ov note states STOP XARELTO WHEN FINISHED AND START ELIQUIS 5MG  TWICE DAILY  Will call the pt to see what she has been taking. There was no answer and spoke with [facility] and advised eliquis rx was sent to [facility] mail order when the change occurred and pt is supposed to be on eliquis.

## 2022-10-12 NOTE — Telephone Encounter (Signed)
Prescription Request  10/12/2022  LOV: 09/01/2022  [facility] is pharmacy of choice.   What is the name of the medication or equipment? albuterol (VENTOLIN HFA) 108 (90 Base) MCG/ACT inhaler , ALPRAZolam (XANAX) 0.5 MG tablet   Have you contacted your pharmacy to request a refill? Yes   Which pharmacy would you like this sent to?  [facility] - [place], [place] - [address] [place] [address] Phone: [phone] Fax: [phone]  [facility] - [place], [place] - [address] [place] [address] Phone: [phone] Fax: [phone]    Patient notified that their request is being sent to the clinical staff for review and that they should receive a response within 2 business days.   Please advise at [facility] [phone]

## 2022-10-12 NOTE — Telephone Encounter (Signed)
Okay, alprazolam prescription sent

## 2022-10-12 NOTE — Telephone Encounter (Signed)
Pls clarify which pharmacy pt wants for this rx

## 2022-10-19 ENCOUNTER — Telehealth: Payer: Self-pay | Admitting: Internal Medicine

## 2022-10-19 NOTE — Telephone Encounter (Signed)
Call to patient.  Vm is full unable to LM.

## 2022-10-19 NOTE — Telephone Encounter (Signed)
Pt c/o Shortness Of Breath: STAT if SOB developed within the last 24 hours or pt is noticeably SOB on the phone  1. Are you currently SOB (can you hear that pt is SOB on the phone)?   No  2. How long have you been experiencing SOB? Since last week  3. Are you SOB when sitting or when up moving around?   Mostly when she was exercising/doing physical therapy  4. Are you currently experiencing any other symptoms?  Fatigue  Patient stated she has been having SOB more than usual.  Patient stated she has been going to therapy and has been having SOB.

## 2022-10-19 NOTE — Telephone Encounter (Signed)
Patient states SOB for approx a week and no energy what so ever" Has to move chair to chair.  Thinks she had Covid at beginning of the week.  She states "my test was not complete" so unaware if covid or not.  She only wants to sleep.  She is clearing her throat continuously through the call.  She is taking cough medicine and helps.  She has taken O2 with pulse ox and varies 88- 95. No chest pain. No other symptoms  Advised to take a Covid test and reach out to PCP first for F/U as it seems to be from cold and cough issues or possible Covid.  She will be evaluated with them first then notify us.

## 2022-10-22 ENCOUNTER — Inpatient Hospital Stay (HOSPITAL_BASED_OUTPATIENT_CLINIC_OR_DEPARTMENT_OTHER)
Admission: EM | Admit: 2022-10-22 | Discharge: 2022-10-26 | DRG: 683 | Disposition: A | Discharge status: Skilled Nursing Facility | Payer: Medicare PPO | Attending: Family Medicine | Admitting: Family Medicine

## 2022-10-22 ENCOUNTER — Other Ambulatory Visit: Payer: Self-pay

## 2022-10-22 ENCOUNTER — Emergency Department (HOSPITAL_BASED_OUTPATIENT_CLINIC_OR_DEPARTMENT_OTHER): Payer: Medicare PPO

## 2022-10-22 ENCOUNTER — Encounter (HOSPITAL_BASED_OUTPATIENT_CLINIC_OR_DEPARTMENT_OTHER): Payer: Self-pay | Admitting: Emergency Medicine

## 2022-10-22 DIAGNOSIS — Z8616 Personal history of COVID-19: Secondary | ICD-10-CM

## 2022-10-22 DIAGNOSIS — I509 Heart failure, unspecified: Secondary | ICD-10-CM | POA: Diagnosis not present

## 2022-10-22 DIAGNOSIS — F419 Anxiety disorder, unspecified: Secondary | ICD-10-CM | POA: Diagnosis present

## 2022-10-22 DIAGNOSIS — R5381 Other malaise: Secondary | ICD-10-CM | POA: Diagnosis not present

## 2022-10-22 DIAGNOSIS — Z87442 Personal history of urinary calculi: Secondary | ICD-10-CM | POA: Diagnosis not present

## 2022-10-22 DIAGNOSIS — I5032 Chronic diastolic (congestive) heart failure: Secondary | ICD-10-CM | POA: Diagnosis present

## 2022-10-22 DIAGNOSIS — Z860101 Personal history of adenomatous and serrated colon polyps: Secondary | ICD-10-CM | POA: Diagnosis not present

## 2022-10-22 DIAGNOSIS — Z8249 Family history of ischemic heart disease and other diseases of the circulatory system: Secondary | ICD-10-CM | POA: Diagnosis not present

## 2022-10-22 DIAGNOSIS — Z9842 Cataract extraction status, left eye: Secondary | ICD-10-CM

## 2022-10-22 DIAGNOSIS — Z7901 Long term (current) use of anticoagulants: Secondary | ICD-10-CM | POA: Diagnosis not present

## 2022-10-22 DIAGNOSIS — R457 State of emotional shock and stress, unspecified: Secondary | ICD-10-CM | POA: Diagnosis not present

## 2022-10-22 DIAGNOSIS — Z961 Presence of intraocular lens: Secondary | ICD-10-CM | POA: Diagnosis present

## 2022-10-22 DIAGNOSIS — Z833 Family history of diabetes mellitus: Secondary | ICD-10-CM | POA: Diagnosis not present

## 2022-10-22 DIAGNOSIS — R531 Weakness: Secondary | ICD-10-CM | POA: Diagnosis not present

## 2022-10-22 DIAGNOSIS — Z9841 Cataract extraction status, right eye: Secondary | ICD-10-CM

## 2022-10-22 DIAGNOSIS — E785 Hyperlipidemia, unspecified: Secondary | ICD-10-CM | POA: Diagnosis present

## 2022-10-22 DIAGNOSIS — Z79899 Other long term (current) drug therapy: Secondary | ICD-10-CM

## 2022-10-22 DIAGNOSIS — I130 Hypertensive heart and chronic kidney disease with heart failure and stage 1 through stage 4 chronic kidney disease, or unspecified chronic kidney disease: Secondary | ICD-10-CM | POA: Diagnosis present

## 2022-10-22 DIAGNOSIS — Z7984 Long term (current) use of oral hypoglycemic drugs: Secondary | ICD-10-CM | POA: Diagnosis not present

## 2022-10-22 DIAGNOSIS — Z86006 Personal history of melanoma in-situ: Secondary | ICD-10-CM

## 2022-10-22 DIAGNOSIS — I480 Paroxysmal atrial fibrillation: Secondary | ICD-10-CM | POA: Diagnosis present

## 2022-10-22 DIAGNOSIS — N179 Acute kidney failure, unspecified: Secondary | ICD-10-CM | POA: Diagnosis present

## 2022-10-22 DIAGNOSIS — M51369 Other intervertebral disc degeneration, lumbar region without mention of lumbar back pain or lower extremity pain: Secondary | ICD-10-CM | POA: Diagnosis present

## 2022-10-22 DIAGNOSIS — Z881 Allergy status to other antibiotic agents status: Secondary | ICD-10-CM

## 2022-10-22 DIAGNOSIS — R739 Hyperglycemia, unspecified: Secondary | ICD-10-CM | POA: Diagnosis not present

## 2022-10-22 DIAGNOSIS — R7989 Other specified abnormal findings of blood chemistry: Secondary | ICD-10-CM

## 2022-10-22 DIAGNOSIS — I10 Essential (primary) hypertension: Secondary | ICD-10-CM | POA: Diagnosis present

## 2022-10-22 DIAGNOSIS — R7401 Elevation of levels of liver transaminase levels: Secondary | ICD-10-CM | POA: Diagnosis present

## 2022-10-22 DIAGNOSIS — Z882 Allergy status to sulfonamides status: Secondary | ICD-10-CM

## 2022-10-22 DIAGNOSIS — J9811 Atelectasis: Secondary | ICD-10-CM | POA: Diagnosis present

## 2022-10-22 DIAGNOSIS — M8580 Other specified disorders of bone density and structure, unspecified site: Secondary | ICD-10-CM | POA: Diagnosis present

## 2022-10-22 DIAGNOSIS — I959 Hypotension, unspecified: Secondary | ICD-10-CM | POA: Diagnosis not present

## 2022-10-22 DIAGNOSIS — R918 Other nonspecific abnormal finding of lung field: Secondary | ICD-10-CM | POA: Diagnosis not present

## 2022-10-22 DIAGNOSIS — J1282 Pneumonia due to coronavirus disease 2019: Secondary | ICD-10-CM | POA: Diagnosis not present

## 2022-10-22 DIAGNOSIS — I700 Atherosclerosis of aorta: Secondary | ICD-10-CM | POA: Diagnosis not present

## 2022-10-22 DIAGNOSIS — U071 COVID-19: Secondary | ICD-10-CM | POA: Diagnosis not present

## 2022-10-22 DIAGNOSIS — Z8701 Personal history of pneumonia (recurrent): Secondary | ICD-10-CM

## 2022-10-22 DIAGNOSIS — M170 Bilateral primary osteoarthritis of knee: Secondary | ICD-10-CM | POA: Diagnosis present

## 2022-10-22 DIAGNOSIS — Z880 Allergy status to penicillin: Secondary | ICD-10-CM

## 2022-10-22 DIAGNOSIS — N1832 Chronic kidney disease, stage 3b: Secondary | ICD-10-CM | POA: Diagnosis present

## 2022-10-22 DIAGNOSIS — R5383 Other fatigue: Secondary | ICD-10-CM | POA: Diagnosis present

## 2022-10-22 DIAGNOSIS — I444 Left anterior fascicular block: Secondary | ICD-10-CM | POA: Diagnosis present

## 2022-10-22 DIAGNOSIS — I517 Cardiomegaly: Secondary | ICD-10-CM | POA: Diagnosis not present

## 2022-10-22 LAB — MAGNESIUM: Magnesium: 2.4 mg/dL (ref 1.7–2.4)

## 2022-10-22 LAB — COMPREHENSIVE METABOLIC PANEL
ALT: 57 U/L — ABNORMAL HIGH (ref 0–44)
AST: 44 U/L — ABNORMAL HIGH (ref 15–41)
Albumin: 3 g/dL — ABNORMAL LOW (ref 3.5–5.0)
Alkaline Phosphatase: 107 U/L (ref 38–126)
Anion gap: 14 (ref 5–15)
BUN: 53 mg/dL — ABNORMAL HIGH (ref 8–23)
CO2: 22 mmol/L (ref 22–32)
Calcium: 9.5 mg/dL (ref 8.9–10.3)
Chloride: 103 mmol/L (ref 98–111)
Creatinine, Ser: 2.51 mg/dL — ABNORMAL HIGH (ref 0.44–1.00)
GFR, Estimated: 18 mL/min — ABNORMAL LOW (ref >60)
Glucose, Bld: 113 mg/dL — ABNORMAL HIGH (ref 70–99)
Potassium: 3.8 mmol/L (ref 3.5–5.1)
Sodium: 139 mmol/L (ref 135–145)
Total Bilirubin: 0.9 mg/dL (ref 0.3–1.2)
Total Protein: 7.1 g/dL (ref 6.5–8.1)

## 2022-10-22 LAB — TROPONIN I (HIGH SENSITIVITY)
Troponin I (High Sensitivity): 12 ng/L (ref <18)
Troponin I (High Sensitivity): 11 ng/L (ref <18)

## 2022-10-22 LAB — CBC
HCT: 42.7 % (ref 36.0–46.0)
Hemoglobin: 13.5 g/dL (ref 12.0–15.0)
MCH: 28 pg (ref 26.0–34.0)
MCHC: 31.6 g/dL (ref 30.0–36.0)
MCV: 88.4 fL (ref 80.0–100.0)
Platelets: 321 10*3/uL (ref 150–400)
RBC: 4.83 MIL/uL (ref 3.87–5.11)
RDW: 14.4 % (ref 11.5–15.5)
WBC: 6.6 10*3/uL (ref 4.0–10.5)
nRBC: 0 % (ref 0.0–0.2)

## 2022-10-22 LAB — BRAIN NATRIURETIC PEPTIDE: B Natriuretic Peptide: 176.7 pg/mL — ABNORMAL HIGH (ref 0.0–100.0)

## 2022-10-22 LAB — SARS CORONAVIRUS 2 BY RT PCR: SARS Coronavirus 2 by RT PCR: NEGATIVE

## 2022-10-22 MED ORDER — LACTATED RINGERS IV SOLN: INTRAVENOUS | Status: AC

## 2022-10-22 MED ORDER — HYDRALAZINE HCL 25 MG PO TABS
100.0000 mg | ORAL_TABLET | Freq: Two times a day (BID) | ORAL | Status: DC
  Administered 2022-10-23 – 2022-10-26 (×7): 100 mg via ORAL
  Filled 2022-10-22 (×7): qty 4

## 2022-10-22 MED ORDER — METOPROLOL TARTRATE 50 MG PO TABS
100.0000 mg | ORAL_TABLET | Freq: Two times a day (BID) | ORAL | Status: DC
  Administered 2022-10-22 – 2022-10-26 (×7): 100 mg via ORAL
  Filled 2022-10-22 (×8): qty 2

## 2022-10-22 MED ORDER — ACETAMINOPHEN 650 MG RE SUPP: 650.0000 mg | Freq: Four times a day (QID) | RECTAL | Status: DC | PRN

## 2022-10-22 MED ORDER — SENNOSIDES-DOCUSATE SODIUM 8.6-50 MG PO TABS: 1.0000 | ORAL_TABLET | Freq: Every evening | ORAL | Status: DC | PRN

## 2022-10-22 MED ORDER — ALBUTEROL SULFATE (2.5 MG/3ML) 0.083% IN NEBU
3.0000 mL | INHALATION_SOLUTION | RESPIRATORY_TRACT | Status: DC | PRN
  Administered 2022-10-23 – 2022-10-24 (×2): 3 mL via RESPIRATORY_TRACT
  Filled 2022-10-22 (×2): qty 3

## 2022-10-22 MED ORDER — SODIUM CHLORIDE 0.9 % IV BOLUS
500.0000 mL | Freq: Once | INTRAVENOUS | Status: AC
  Administered 2022-10-22: 500 mL via INTRAVENOUS

## 2022-10-22 MED ORDER — ATORVASTATIN CALCIUM 40 MG PO TABS
40.0000 mg | ORAL_TABLET | Freq: Every day | ORAL | Status: DC
  Administered 2022-10-23 – 2022-10-26 (×4): 40 mg via ORAL
  Filled 2022-10-22 (×4): qty 1

## 2022-10-22 MED ORDER — APIXABAN 5 MG PO TABS
5.0000 mg | ORAL_TABLET | Freq: Two times a day (BID) | ORAL | Status: DC
  Administered 2022-10-22: 5 mg via ORAL
  Filled 2022-10-22 (×2): qty 1

## 2022-10-22 MED ORDER — ALPRAZOLAM 0.5 MG PO TABS
0.5000 mg | ORAL_TABLET | Freq: Three times a day (TID) | ORAL | Status: DC | PRN
  Administered 2022-10-22 – 2022-10-25 (×5): 0.5 mg via ORAL
  Filled 2022-10-22 (×5): qty 1

## 2022-10-22 MED ORDER — AMIODARONE HCL 200 MG PO TABS
200.0000 mg | ORAL_TABLET | Freq: Every day | ORAL | Status: DC
  Administered 2022-10-23 – 2022-10-26 (×4): 200 mg via ORAL
  Filled 2022-10-22 (×4): qty 1

## 2022-10-22 MED ORDER — ACETAMINOPHEN 325 MG PO TABS
650.0000 mg | ORAL_TABLET | Freq: Four times a day (QID) | ORAL | Status: DC | PRN
  Administered 2022-10-24 – 2022-10-25 (×3): 650 mg via ORAL
  Filled 2022-10-22 (×3): qty 2

## 2022-10-22 NOTE — Progress Notes (Addendum)
Plan of Care Note for accepted transfer   Patient: Brenda Matthews MRN: [id number]   DOA: 10/22/2022  Facility requesting transfer: [facility] ED Requesting Provider: Dr. Charm Barges  Reason for transfer: AKI Facility course: 87 yo F with chronic diastolic HF, PAF on anticoagulation, Ck3b, HLD had COVID a week and half ago. Has tested negative since. However she is feeling weak and sleeping all the time.   In the ED, she is well appearing. No shortness of breath but was placed on 2L for comfort.  Has new AKI on BMP. No leukocytosis. Repeat COVID PCR is negative.  CXR with bilateral atelectasis.   Had a fall a few days ago and landed on buttocks but EDP did not feel she has findings warranting any further imaging of the area.   She was given 500cc bolus fluid in ED.   Asked to admit for management of AKI and weakness.   Plan of care: The patient is accepted for admission to Med-surg  unit, at [facility] or [facility] depending on bed availability. EDP to continue care of pt while she remains in ED.   Author: Anselm Jungling, DO 10/22/2022  Check [email or web address] for on-call coverage.  Nursing staff, Please call [facility] Admits & Consults System-Wide number on Amion as soon as patient's arrival, so appropriate admitting provider can evaluate the pt.

## 2022-10-22 NOTE — ED Notes (Signed)
   10/22/22 2000  Hand-off documentation  Hand-off Received Ready to receive patient from the ED  Report received from (Full Name) [facility] RN

## 2022-10-22 NOTE — H&P (Signed)
History and Physical    MAELY DIONNE ZOX:[id number] DOB: 1935/11/20 DOA: 10/22/2022  PCP: Jeoffrey Massed, MD   Patient coming from: Home   Chief Complaint: Weakness   HPI: URMI MCFATTER is a 87 y.o. female with medical history significant for hypertension, hyperlipidemia, anxiety, PAF on Eliquis, chronic HFpEF, CKD 3B, and recent COVID-19 infection who presents with generalized weakness.  Patient reports that she contracted COVID-19 roughly 2 weeks ago, had mild respiratory symptoms, loss of appetite, and fatigue.  Respiratory symptoms resolved, appetite has improved, but she continues to has developed severe generalized weakness that has made it difficult for her to perform ADLs at home.  She had a minor fall onto her bottom 2 or 3 days ago but denies any pain related to that.  She has continued to take her Lasix as prescribed.  She reports history of bilateral lower extremity swelling, but none recently.  [facility] ED Course: Upon arrival to the ED, patient is found to be afebrile and saturating upper 80s to upper 90s on room air with normal heart rate and stable blood pressure.  Labs are most notable for BUN 53, creatinine 2.51, AST 44, and ALT 57.  COVID-19 PCR was negative.  Patient was given 500 mL of normal saline and transferred to [facility] for admission.  Review of Systems:  All other systems reviewed and apart from HPI, are negative.  Past Medical History:  Diagnosis Date   Abnormal EKG approx 2008   Nuclear stress test neg;    Acute pancreatitis 09/2020   idiopathic.  +panc psudocyst   Anxiety    with panic   CAP (community acquired pneumonia) 08/2017   Hospitalization for CAP/acute diast HF/rapid a-fib   Cataract    s/p surgery--lens implants   Chronic diastolic heart failure (HCC) 2019   Chronic renal insufficiency, stage 3 (moderate) (HCC) 12/04/2012   Renal u/s when in hosp 08/2017 for CAP/CHF showed symmetric kidneys, echogenicity normal, w/out  hydronephrosis. Baseline GFR around 40 ml/min as of 10/2018.   DDD (degenerative disc disease), lumbar    Diverticulosis 2009   Fracture of radial shaft, left, closed 11/16/2010   fell down flight of stairs   History of kidney stones    Hx of adenomatous colonic polyps 2002;2009;2015   surveillance colonoscopy 2009, +polypectomy done-tubular adenoma w/out high grade.  05/2013 tubular adenomas--recall 3 yrs   Hyperlipidemia    Hypertension    Low TSH level 02/18/2016   T3 norm, T4 mildly elevated--suspected sick euthyroid syndrome.  Repeat labs 06/2016: normal.   Melanoma in situ (HCC) 06/2018   L LL   Osteoarthritis of both knees    viscosupplementation injections helpful 2020/21   Osteopenia    DEXA 08/2010; repeat DEXA 02/2015 worse: fosamax started.  06/2018 Dexa T score -2.4.  2020 maj osteop fx risk = 24%, Hip fx risk 7.3%. 09/2020 T score -2.1   PAF (paroxysmal atrial fibrillation) (HCC) 02/2016   2018.   DCCV x 2 2024.   Peripheral edema    Pneumonia 2015   hx with sepsis   Rheumatic fever     Past Surgical History:  Procedure Laterality Date   APPENDECTOMY  1966   done during surgery for tubal pregnancy   CARDIOVERSION N/A 06/17/2022   Procedure: CARDIOVERSION;  Surgeon: Jodelle Red, MD;  Location: [facility] INVASIVE CV LAB;  Service: Cardiovascular;  Laterality: N/A;   CARDIOVERSION N/A 07/11/2022   Procedure: CARDIOVERSION;  Surgeon: Dolores Patty, MD;  Location:  [facility] INVASIVE CV LAB;  Service: Cardiovascular;  Laterality: N/A;   CATARACT EXTRACTION W/ INTRAOCULAR LENS IMPLANT  2013   bilat   COLONOSCOPY W/ POLYPECTOMY  05/2013   +diverticulosis; recall 3 yrs (Dr. Marina Goodell)   DEXA  02/2015; 06/2018   T score -2.1 in both femoral necks; FRAX 10 yr risk of major osteoporotic fracture was 21%---fosamax started. 06/2018 T score -2.4.  T score 09/2020 -2.1. Rpt 2 yrs.   ECTOPIC PREGNANCY SURGERY     EYE SURGERY     LEFT HEART CATH AND CORONARY ANGIOGRAPHY N/A 09/01/2017    No angiographically significant CAD.  Upper normal left ventricular filling pressure.  Procedure: LEFT HEART CATH AND CORONARY ANGIOGRAPHY;  Surgeon: Yvonne Kendall, MD;  Location: [facility] INVASIVE CV LAB;  Service: Cardiovascular;  Laterality: N/A;   LUMBAR LAMINECTOMY/DECOMPRESSION MICRODISCECTOMY N/A 09/26/2018   Procedure: Decompressive Lumbar Laminectomy L5 S1 FORAMINOTOMY L5 S1  NERVE ROOT BILATERALLY and Microdiscectomy L5-S1 Left;  Surgeon: Ranee Gosselin, MD;  Location: [facility] ORS;  Service: Orthopedics;  Laterality: N/A;    OPEN REDUCTION INTERNAL FIXATION (ORIF) TIBIA/FIBULA FRACTURE Left 02/18/2016   Procedure: OPEN REDUCTION INTERNAL FIXATION (ORIF) Right ankle trimalleolar fracture;  Surgeon: Toni Arthurs, MD;  Location: [facility] OR;  Service: Orthopedics;  Laterality: Left;  requests   ORIF RADIAL FRACTURE  11/18/2010   left; s/p slip on slippery floor and fell   THORACENTESIS  08/2017   diagnostic and therapeutic.  Transudative.  Clx neg.  (+pulm edema/diastolic HF)   TONSILLECTOMY     TRANSTHORACIC ECHOCARDIOGRAM  02/18/2016; 08/10/17   LVEF of 55-60%, mild AI and mild MR and normal biatrial size.  07/2017--normal LV function, mild enlarge aortic root, mild/mod TR, bilat atrial enlargement. 06/09/22 EF 50-55%, mod MR, aortic root stable enlgmt    Social History:   reports that she has never smoked. She has never used smokeless tobacco. She reports current alcohol use. She reports that she does not use drugs.  Allergies  Allergen Reactions   Augmentin [Amoxicillin-Pot Clavulanate] Nausea And Vomiting and Other (See Comments)    "projectile vomiting" Has patient had a PCN reaction causing immediate rash, facial/tongue/throat swelling, SOB or lightheadedness with hypotension:No Has patient had a PCN reaction causing severe rash involving mucus membranes or skin necrosis:No Has patient had a PCN reaction that required hospitalization:No Has patient had a PCN reaction occurring  within the last 10 years:Yes If all of the above answers are "NO", then may proceed with Cephalosporin use.    Amoxicillin Rash   Clindamycin/Lincomycin Rash    Family History  Problem Relation Age of Onset   Heart disease Mother    Heart disease Father    Hypertension Brother    Diabetes Sister    Colon cancer Neg Hx    Pancreatic cancer Neg Hx    Rectal cancer Neg Hx    Stomach cancer Neg Hx      Prior to Admission medications   Medication Sig Start Date End Date Taking? Authorizing Provider  acetaminophen (TYLENOL) 650 MG CR tablet Take 1,300 mg by mouth daily.    [provider]  albuterol (VENTOLIN HFA) 108 (90 Base) MCG/ACT inhaler INHALE 2 PUFFS BY MOUTH into THE lungs EVERY 4 HOURS AS NEEDED FOR WHEEZING OR SHORTNESS OF BREATH 10/12/22   McGowen, Maryjean Morn, MD  ALPRAZolam Prudy Feeler) 0.5 MG tablet Take 1 tablet (0.5 mg total) by mouth 3 (three) times daily as needed for anxiety. TAKE 1 TABLET BY MOUTH 3 TIMES DAILY  AS NEEDED FOR ANXIETY 10/12/22   McGowen, Maryjean Morn, MD  amiodarone (PACERONE) 200 MG tablet Take 1 tablet (200 mg total) by mouth daily. 08/04/22   Ronney Asters, NP  apixaban (ELIQUIS) 5 MG TABS tablet Take 1 tablet (5 mg total) by mouth 2 (two) times daily. 10/11/22   Hilty, Lisette Abu, MD  Apoaequorin (PREVAGEN PO) Take 1 tablet by mouth daily.    [provider]  Ascorbic Acid (VITAMIN C PO) Take by mouth daily.    [provider]  atorvastatin (LIPITOR) 40 MG tablet TAKE 1 TABLET BY MOUTH EVERY DAY 01/11/22   McGowen, Maryjean Morn, MD  Biotin 5000 MCG TABS Take 5,000 mcg by mouth daily.    [provider]  Calcium Carbonate (CALCIUM 600 PO) Take 1,200 mg by mouth daily.     [provider]  carboxymethylcellulose (REFRESH PLUS) 0.5 % SOLN Place 2 drops into both eyes daily as needed (dry/irritated eyes.).    [provider]  Coenzyme Q10 200 MG capsule Take 200 mg by mouth daily.    [provider]   empagliflozin (JARDIANCE) 10 MG TABS tablet Take 1 tablet (10 mg total) by mouth daily before breakfast. 08/05/22   Alver Sorrow, NP  fluticasone (FLONASE) 50 MCG/ACT nasal spray Place 2 sprays into both nostrils daily.     [provider]  furosemide (LASIX) 20 MG tablet Take 1 tablet (20 mg total) by mouth daily. 08/04/22   Ronney Asters, NP  hydrALAZINE (APRESOLINE) 100 MG tablet Take 1 tablet (100 mg total) by mouth 2 (two) times daily. 09/01/22   McGowen, Maryjean Morn, MD  hydrocortisone cream 1 % Apply 1 Application topically as needed for itching.    [provider]  metoprolol tartrate (LOPRESSOR) 100 MG tablet Take 1 tablet (100 mg total) by mouth 2 (two) times daily. 01/07/22   Hilty, Lisette Abu, MD  Multiple Vitamins-Minerals (CENTRUM SILVER ULTRA WOMENS) TABS Take 1 tablet by mouth every evening.    [provider]  potassium chloride SA (KLOR-CON M) 20 MEQ tablet Take 1 tablet (20 mEq total) by mouth daily. 08/04/22   Ronney Asters, NP    Physical Exam: Vitals:   10/22/22 1733 10/22/22 1745 10/22/22 1923 10/22/22 2202  BP:   (!) 160/75 (!) 159/69  Pulse: 64 62 60 60  Resp: 18 13 (!) 22 16  Temp:    97.7 F (36.5 C)  TempSrc:    Oral  SpO2: 92% 93% 96% 96%  Weight:      Height:        Constitutional: NAD, calm  Eyes: PERTLA, lids and conjunctivae normal ENMT: Mucous membranes are moist. Posterior pharynx clear of any exudate or lesions.   Neck: supple, no masses  Respiratory: no wheezing, no crackles. No accessory muscle use.  Cardiovascular: S1 & S2 heard, regular rate and rhythm. No extremity edema.   Abdomen: No distension, no tenderness, soft. Bowel sounds active.  Musculoskeletal: no clubbing / cyanosis. No joint deformity upper and lower extremities.   Skin: no significant rashes, lesions, ulcers. Warm, dry, well-perfused. Neurologic: CN 2-12 grossly intact. Moving all extremities. Alert and oriented.  Psychiatric: Pleasant.  Cooperative.    Labs and Imaging on Admission: I have personally reviewed following labs and imaging studies  CBC: Recent Labs  Lab 10/22/22 1721  WBC 6.6  HGB 13.5  HCT 42.7  MCV 88.4  PLT 321   Basic Metabolic Panel: Recent Labs  Lab 10/22/22  1721  NA 139  K 3.8  CL 103  CO2 22  GLUCOSE 113*  BUN 53*  CREATININE 2.51*  CALCIUM 9.5  MG 2.4   GFR: Estimated Creatinine Clearance: 15.6 mL/min (A) (by C-G formula based on SCr of 2.51 mg/dL (H)). Liver Function Tests: Recent Labs  Lab 10/22/22 1721  AST 44*  ALT 57*  ALKPHOS 107  BILITOT 0.9  PROT 7.1  ALBUMIN 3.0*   No results for input(s): "LIPASE", "AMYLASE" in the last 168 hours. No results for input(s): "AMMONIA" in the last 168 hours. Coagulation Profile: No results for input(s): "INR", "PROTIME" in the last 168 hours. Cardiac Enzymes: No results for input(s): "CKTOTAL", "CKMB", "CKMBINDEX", "TROPONINI" in the last 168 hours. BNP (last 3 results) No results for input(s): "PROBNP" in the last 8760 hours. HbA1C: No results for input(s): "HGBA1C" in the last 72 hours. CBG: No results for input(s): "GLUCAP" in the last 168 hours. Lipid Profile: No results for input(s): "CHOL", "HDL", "LDLCALC", "TRIG", "CHOLHDL", "LDLDIRECT" in the last 72 hours. Thyroid Function Tests: No results for input(s): "TSH", "T4TOTAL", "FREET4", "T3FREE", "THYROIDAB" in the last 72 hours. Anemia Panel: No results for input(s): "VITAMINB12", "FOLATE", "FERRITIN", "TIBC", "IRON", "RETICCTPCT" in the last 72 hours. Urine analysis:    Component Value Date/Time   COLORURINE AMBER (A) 09/17/2020 2300   APPEARANCEUR CLOUDY (A) 09/17/2020 2300   LABSPEC 1.010 09/17/2020 2300   PHURINE 6.0 09/17/2020 2300   GLUCOSEU NEGATIVE 09/17/2020 2300   HGBUR NEGATIVE 09/17/2020 2300   HGBUR negative 12/10/2008 0000   BILIRUBINUR SMALL (A) 09/17/2020 2300   BILIRUBINUR negative 08/21/2014 1204   KETONESUR NEGATIVE 09/17/2020 2300    PROTEINUR 30 (A) 09/17/2020 2300   UROBILINOGEN 0.2 08/21/2014 1204   UROBILINOGEN 0.2 12/10/2008 0000   NITRITE NEGATIVE 09/17/2020 2300   LEUKOCYTESUR MODERATE (A) 09/17/2020 2300   Sepsis Labs: @LABRCNTIP (procalcitonin:4,lacticidven:4) ) Recent Results (from the past 240 hour(s))  SARS Coronavirus 2 by RT PCR (hospital order, performed in [facility] hospital lab) *cepheid single result test* Anterior Nasal Swab     Status: None   Collection Time: 10/22/22  5:05 PM   Specimen: Anterior Nasal Swab  Result Value Ref Range Status   SARS Coronavirus 2 by RT PCR NEGATIVE NEGATIVE Final    Comment: (NOTE) SARS-CoV-2 target nucleic acids are NOT DETECTED.  The SARS-CoV-2 RNA is generally detectable in upper and lower respiratory specimens during the acute phase of infection. The lowest concentration of SARS-CoV-2 viral copies this assay can detect is 250 copies / mL. A negative result does not preclude SARS-CoV-2 infection and should not be used as the sole basis for treatment or other patient management decisions.  A negative result may occur with improper specimen collection / handling, submission of specimen other than nasopharyngeal swab, presence of viral mutation(s) within the areas targeted by this assay, and inadequate number of viral copies (<250 copies / mL). A negative result must be combined with clinical observations, patient history, and epidemiological information.  Fact Sheet for Patients:   RoadLapTop.co.za  Fact Sheet for Healthcare Providers: http://kim-miller.com/  This test is not yet approved or  cleared by the [place] FDA and has been authorized for detection and/or diagnosis of SARS-CoV-2 by FDA under an Emergency Use Authorization (EUA).  This EUA will remain in effect (meaning this test can be used) for the duration of the COVID-19 declaration under Section 564(b)(1) of the Act, 21 U.S.C. section  360bbb-3(b)(1), unless the authorization is terminated or revoked sooner.  Performed at [facility], [address] [place], [place] [address]      Radiological Exams on Admission: DG Chest [facility] 1 View  Result Date: 10/22/2022 CLINICAL DATA:  Weakness, subacute COVID pneumonia EXAM: PORTABLE CHEST 1 VIEW COMPARISON:  06/20/2022 FINDINGS: Bibasilar atelectasis. Lungs are otherwise clear. No pneumothorax or pleural effusion. Cardiac size within normal limits. Pulmonary vascularity is normal. No acute bone abnormality. IMPRESSION: 1. Bibasilar atelectasis. Electronically Signed   By: Helyn Numbers M.D.   On: 10/22/2022 18:26    EKG: Independently reviewed. Sinus rhythm, 1st degree AV block, LAFB.   Assessment/Plan   1. AKI superimposed on CKD 3B  - BUN is 53 and SCr 2.51 in ED, up from 25 & 1.50 in July 2024   - Likely prerenal in setting of recent illness with anorexia, continued Lasix use  - Hold Lasix and Jardiance, continue gentle IVF hydration mindful of CHF, renally-dose medications, follow daily chemistry panels   2. General weakness  - Likely deconditioning from recent illness  - Check TSH and CK, continue supportive care, consult PT   3. PAF  - Continue amiodarone and Eliquis    4. Chronic HFpEF  - Appears compensated  - Hold Lasix, monitor weight and I/Os    5. Hypertension  - Continue hydralazine and metoprolol   6. Elevated transaminases  - Transaminases mildly elevated  - Abdominal exam benign, will repeat LFTs in am    DVT prophylaxis: Eliquis Code Status: Full  Level of Care: Level of care: Med-Surg Family Communication: None present   Disposition Plan:  Patient is from: Home  Anticipated d/c is to: TBD Anticipated d/c date is: 10/24/22  Patient currently: Pending improved/stable renal function, PT eval  Consults called: None Admission status: Home     Briscoe Deutscher, MD [facility]  10/22/2022, 10:46 PM

## 2022-10-22 NOTE — ED Notes (Signed)
Fall risk sign on door Fall risk armband Fall risk socks

## 2022-10-22 NOTE — ED Notes (Signed)
[facility] called for transport. 

## 2022-10-22 NOTE — ED Notes (Signed)
Family at bedside. 

## 2022-10-22 NOTE — ED Provider Notes (Signed)
[place] EMERGENCY DEPARTMENT AT [facility] Provider Note   CSN: [id number] Arrival date & time: 10/22/22  1633     History {Add pertinent medical, surgical, social history, OB history to HPI:1} Chief Complaint  Patient presents with   Fatigue    Brenda Matthews is a 87 y.o. female.  She has a history of A-fib on anticoagulation, CKD, CHF.  She said she caught COVID a little over a week ago.  She has been resting at home and tested negative on Monday.  She still unfortunately does not feel well.  She feels very weak and is sleeping a lot.  She said she fell off her buttocks few days ago, did not hit her head and did not lose consciousness.  Symptoms continue and has no appetite.  Has a little bit of loose stool.  No fever.  Has a little bit of a cough, no headache no abdominal pain no chest pain or shortness of breath.  No urinary symptoms.  The history is provided by the patient and the EMS personnel.  Weakness Severity:  Severe Onset quality:  Gradual Duration:  1 week Timing:  Constant Progression:  Unchanged Chronicity:  New Context: recent infection   Relieved by:  Nothing Worsened by:  Activity Ineffective treatments:  Rest Associated symptoms: cough, diarrhea, difficulty walking and falls   Associated symptoms: no abdominal pain, no chest pain, no dysuria, no fever, no foul-smelling urine, no frequency, no headaches, no loss of consciousness, no nausea, no shortness of breath and no vomiting        Home Medications Prior to Admission medications   Medication Sig Start Date End Date Taking? Authorizing Provider  acetaminophen (TYLENOL) 650 MG CR tablet Take 1,300 mg by mouth daily.    [provider]  albuterol (VENTOLIN HFA) 108 (90 Base) MCG/ACT inhaler INHALE 2 PUFFS BY MOUTH into THE lungs EVERY 4 HOURS AS NEEDED FOR WHEEZING OR SHORTNESS OF BREATH 10/12/22   McGowen, Maryjean Morn, MD  ALPRAZolam Prudy Feeler) 0.5 MG tablet Take 1 tablet (0.5 mg total) by  mouth 3 (three) times daily as needed for anxiety. TAKE 1 TABLET BY MOUTH 3 TIMES DAILY AS NEEDED FOR ANXIETY 10/12/22   McGowen, Maryjean Morn, MD  amiodarone (PACERONE) 200 MG tablet Take 1 tablet (200 mg total) by mouth daily. 08/04/22   Ronney Asters, NP  apixaban (ELIQUIS) 5 MG TABS tablet Take 1 tablet (5 mg total) by mouth 2 (two) times daily. 10/11/22   Hilty, Lisette Abu, MD  Apoaequorin (PREVAGEN PO) Take 1 tablet by mouth daily.    [provider]  Ascorbic Acid (VITAMIN C PO) Take by mouth daily.    [provider]  atorvastatin (LIPITOR) 40 MG tablet TAKE 1 TABLET BY MOUTH EVERY DAY 01/11/22   McGowen, Maryjean Morn, MD  Biotin 5000 MCG TABS Take 5,000 mcg by mouth daily.    [provider]  Calcium Carbonate (CALCIUM 600 PO) Take 1,200 mg by mouth daily.     [provider]  carboxymethylcellulose (REFRESH PLUS) 0.5 % SOLN Place 2 drops into both eyes daily as needed (dry/irritated eyes.).    [provider]  Coenzyme Q10 200 MG capsule Take 200 mg by mouth daily.    [provider]  empagliflozin (JARDIANCE) 10 MG TABS tablet Take 1 tablet (10 mg total) by mouth daily before breakfast. 08/05/22   Alver Sorrow, NP  fluticasone (FLONASE) 50 MCG/ACT nasal spray Place 2 sprays into both  nostrils daily.     [provider]  furosemide (LASIX) 20 MG tablet Take 1 tablet (20 mg total) by mouth daily. 08/04/22   Ronney Asters, NP  hydrALAZINE (APRESOLINE) 100 MG tablet Take 1 tablet (100 mg total) by mouth 2 (two) times daily. 09/01/22   McGowen, Maryjean Morn, MD  hydrocortisone cream 1 % Apply 1 Application topically as needed for itching.    [provider]  metoprolol tartrate (LOPRESSOR) 100 MG tablet Take 1 tablet (100 mg total) by mouth 2 (two) times daily. 01/07/22   Hilty, Lisette Abu, MD  Multiple Vitamins-Minerals (CENTRUM SILVER ULTRA WOMENS) TABS Take 1 tablet by mouth every evening.    [provider]  potassium  chloride SA (KLOR-CON M) 20 MEQ tablet Take 1 tablet (20 mEq total) by mouth daily. 08/04/22   Ronney Asters, NP      Allergies    Augmentin [amoxicillin-pot clavulanate], Amoxicillin, and Clindamycin/lincomycin    Review of Systems   Review of Systems  Constitutional:  Negative for fever.  Eyes:  Negative for visual disturbance.  Respiratory:  Positive for cough. Negative for shortness of breath.   Cardiovascular:  Negative for chest pain.  Gastrointestinal:  Positive for diarrhea. Negative for abdominal pain, nausea and vomiting.  Genitourinary:  Negative for dysuria and frequency.  Musculoskeletal:  Positive for falls and gait problem.  Neurological:  Positive for weakness. Negative for loss of consciousness and headaches.    Physical Exam Updated Vital Signs BP 122/76 (BP Location: Right Arm)   Pulse 67   Temp 98.5 F (36.9 C) (Oral)   Resp 15   Ht 5' 1.5" (1.562 m)   Wt 77.1 kg   LMP  (LMP Unknown)   SpO2 93%   BMI 31.60 kg/m  Physical Exam Vitals and nursing note reviewed.  Constitutional:      General: She is not in acute distress.    Appearance: Normal appearance. She is well-developed.  HENT:     Head: Normocephalic and atraumatic.  Eyes:     Conjunctiva/sclera: Conjunctivae normal.  Cardiovascular:     Rate and Rhythm: Normal rate and regular rhythm.     Heart sounds: No murmur heard. Pulmonary:     Effort: Pulmonary effort is normal. No respiratory distress.     Breath sounds: Normal breath sounds.  Abdominal:     Palpations: Abdomen is soft.     Tenderness: There is no abdominal tenderness. There is no guarding or rebound.  Musculoskeletal:        General: No deformity. Normal range of motion.     Cervical back: Neck supple.     Right lower leg: No edema.     Left lower leg: No edema.  Skin:    General: Skin is warm and dry.     Capillary Refill: Capillary refill takes less than 2 seconds.  Neurological:     General: No focal deficit present.      Mental Status: She is alert.     Motor: No weakness.     ED Results / Procedures / Treatments   Labs (all labs ordered are listed, but only abnormal results are displayed) Labs Reviewed  CBC  URINALYSIS, ROUTINE W REFLEX MICROSCOPIC    EKG None  Radiology No results found.  Procedures Procedures  {Document cardiac monitor, telemetry assessment procedure when appropriate:1}  Medications Ordered in ED Medications  sodium chloride 0.9 % bolus 500 mL (has no administration in time range)  ED Course/ Medical Decision Making/ A&P   {   Click here for ABCD2, HEART and other calculatorsREFRESH Note before signing :1}                              Medical Decision Making Amount and/or Complexity of Data Reviewed Labs: ordered. Radiology: ordered.   This patient complains of ***; this involves an extensive number of treatment Options and is a complaint that carries with it a high risk of complications and morbidity. The differential includes ***  I ordered, reviewed and interpreted labs, which included *** I ordered medication *** and reviewed PMP when indicated. I ordered imaging studies which included *** and I independently    visualized and interpreted imaging which showed *** Additional history obtained from *** Previous records obtained and reviewed *** I consulted *** and discussed lab and imaging findings and discussed disposition.  Cardiac monitoring reviewed, *** Social determinants considered, *** Critical Interventions: ***  After the interventions stated above, I reevaluated the patient and found *** Admission and further testing considered, ***   {Document critical care time when appropriate:1} {Document review of labs and clinical decision tools ie heart score, Chads2Vasc2 etc:1}  {Document your independent review of radiology images, and any outside records:1} {Document your discussion with family members, caretakers, and with  consultants:1} {Document social determinants of health affecting pt's care:1} {Document your decision making why or why not admission, treatments were needed:1} Final Clinical Impression(s) / ED Diagnoses Final diagnoses:  None    Rx / DC Orders ED Discharge Orders     None

## 2022-10-22 NOTE — ED Notes (Addendum)
error 

## 2022-10-22 NOTE — ED Notes (Signed)
Pt reports falling Tues or Wednesday, landing on bottom. Denies head injury, denies LOC.  Takes eliquis.  EDP Charm Barges made aware.

## 2022-10-22 NOTE — ED Triage Notes (Signed)
Pt BIBA from home-   Per EMS: pt with COVID 2 weeks ago, c/o ongoing fatigue since onset of sx.  Denies LOC, n/v/d  Cbg 255 120/72 HR 76 Hx afib

## 2022-10-23 DIAGNOSIS — Z961 Presence of intraocular lens: Secondary | ICD-10-CM | POA: Diagnosis present

## 2022-10-23 DIAGNOSIS — R531 Weakness: Secondary | ICD-10-CM

## 2022-10-23 DIAGNOSIS — I509 Heart failure, unspecified: Secondary | ICD-10-CM | POA: Diagnosis not present

## 2022-10-23 DIAGNOSIS — Z860101 Personal history of adenomatous and serrated colon polyps: Secondary | ICD-10-CM | POA: Diagnosis not present

## 2022-10-23 DIAGNOSIS — Z8701 Personal history of pneumonia (recurrent): Secondary | ICD-10-CM | POA: Diagnosis not present

## 2022-10-23 DIAGNOSIS — R7989 Other specified abnormal findings of blood chemistry: Secondary | ICD-10-CM

## 2022-10-23 DIAGNOSIS — Z8249 Family history of ischemic heart disease and other diseases of the circulatory system: Secondary | ICD-10-CM | POA: Diagnosis not present

## 2022-10-23 DIAGNOSIS — N1832 Chronic kidney disease, stage 3b: Secondary | ICD-10-CM | POA: Diagnosis present

## 2022-10-23 DIAGNOSIS — N179 Acute kidney failure, unspecified: Secondary | ICD-10-CM | POA: Diagnosis present

## 2022-10-23 DIAGNOSIS — Z79899 Other long term (current) drug therapy: Secondary | ICD-10-CM | POA: Diagnosis not present

## 2022-10-23 DIAGNOSIS — Z7901 Long term (current) use of anticoagulants: Secondary | ICD-10-CM | POA: Diagnosis not present

## 2022-10-23 DIAGNOSIS — Z9841 Cataract extraction status, right eye: Secondary | ICD-10-CM | POA: Diagnosis not present

## 2022-10-23 DIAGNOSIS — M170 Bilateral primary osteoarthritis of knee: Secondary | ICD-10-CM | POA: Diagnosis present

## 2022-10-23 DIAGNOSIS — Z8616 Personal history of COVID-19: Secondary | ICD-10-CM | POA: Diagnosis not present

## 2022-10-23 DIAGNOSIS — Z9842 Cataract extraction status, left eye: Secondary | ICD-10-CM | POA: Diagnosis not present

## 2022-10-23 DIAGNOSIS — F419 Anxiety disorder, unspecified: Secondary | ICD-10-CM | POA: Diagnosis present

## 2022-10-23 DIAGNOSIS — I130 Hypertensive heart and chronic kidney disease with heart failure and stage 1 through stage 4 chronic kidney disease, or unspecified chronic kidney disease: Secondary | ICD-10-CM | POA: Diagnosis present

## 2022-10-23 DIAGNOSIS — I480 Paroxysmal atrial fibrillation: Secondary | ICD-10-CM | POA: Diagnosis present

## 2022-10-23 DIAGNOSIS — Z87442 Personal history of urinary calculi: Secondary | ICD-10-CM | POA: Diagnosis not present

## 2022-10-23 DIAGNOSIS — Z7984 Long term (current) use of oral hypoglycemic drugs: Secondary | ICD-10-CM | POA: Diagnosis not present

## 2022-10-23 DIAGNOSIS — Z86006 Personal history of melanoma in-situ: Secondary | ICD-10-CM | POA: Diagnosis not present

## 2022-10-23 DIAGNOSIS — Z833 Family history of diabetes mellitus: Secondary | ICD-10-CM | POA: Diagnosis not present

## 2022-10-23 DIAGNOSIS — M8580 Other specified disorders of bone density and structure, unspecified site: Secondary | ICD-10-CM | POA: Diagnosis present

## 2022-10-23 DIAGNOSIS — J9811 Atelectasis: Secondary | ICD-10-CM | POA: Diagnosis present

## 2022-10-23 DIAGNOSIS — R5383 Other fatigue: Secondary | ICD-10-CM | POA: Diagnosis present

## 2022-10-23 DIAGNOSIS — R918 Other nonspecific abnormal finding of lung field: Secondary | ICD-10-CM | POA: Diagnosis not present

## 2022-10-23 DIAGNOSIS — Z881 Allergy status to other antibiotic agents status: Secondary | ICD-10-CM | POA: Diagnosis not present

## 2022-10-23 DIAGNOSIS — I700 Atherosclerosis of aorta: Secondary | ICD-10-CM | POA: Diagnosis not present

## 2022-10-23 DIAGNOSIS — E785 Hyperlipidemia, unspecified: Secondary | ICD-10-CM | POA: Diagnosis present

## 2022-10-23 DIAGNOSIS — I517 Cardiomegaly: Secondary | ICD-10-CM | POA: Diagnosis not present

## 2022-10-23 DIAGNOSIS — I5032 Chronic diastolic (congestive) heart failure: Secondary | ICD-10-CM | POA: Diagnosis present

## 2022-10-23 LAB — CBC
HCT: 39.9 % (ref 36.0–46.0)
Hemoglobin: 12.5 g/dL (ref 12.0–15.0)
MCH: 28.5 pg (ref 26.0–34.0)
MCHC: 31.3 g/dL (ref 30.0–36.0)
MCV: 90.9 fL (ref 80.0–100.0)
Platelets: 275 10*3/uL (ref 150–400)
RBC: 4.39 MIL/uL (ref 3.87–5.11)
RDW: 14.3 % (ref 11.5–15.5)
WBC: 6.3 10*3/uL (ref 4.0–10.5)
nRBC: 0 % (ref 0.0–0.2)

## 2022-10-23 LAB — COMPREHENSIVE METABOLIC PANEL WITH GFR
ALT: 51 U/L — ABNORMAL HIGH (ref 0–44)
AST: 38 U/L (ref 15–41)
Albumin: 2.8 g/dL — ABNORMAL LOW (ref 3.5–5.0)
Alkaline Phosphatase: 96 U/L (ref 38–126)
Anion gap: 11 (ref 5–15)
BUN: 48 mg/dL — ABNORMAL HIGH (ref 8–23)
CO2: 22 mmol/L (ref 22–32)
Calcium: 9.1 mg/dL (ref 8.9–10.3)
Chloride: 104 mmol/L (ref 98–111)
Creatinine, Ser: 2.05 mg/dL — ABNORMAL HIGH (ref 0.44–1.00)
GFR, Estimated: 23 mL/min — ABNORMAL LOW (ref >60)
Glucose, Bld: 98 mg/dL (ref 70–99)
Potassium: 3.7 mmol/L (ref 3.5–5.1)
Sodium: 137 mmol/L (ref 135–145)
Total Bilirubin: 0.7 mg/dL (ref 0.3–1.2)
Total Protein: 6.8 g/dL (ref 6.5–8.1)

## 2022-10-23 LAB — URINALYSIS, ROUTINE W REFLEX MICROSCOPIC
Bilirubin Urine: NEGATIVE
Glucose, UA: 50 mg/dL — AB
Hgb urine dipstick: NEGATIVE
Ketones, ur: NEGATIVE mg/dL
Nitrite: NEGATIVE
Protein, ur: NEGATIVE mg/dL
Specific Gravity, Urine: 1.011 (ref 1.005–1.030)
pH: 5 (ref 5.0–8.0)

## 2022-10-23 LAB — TSH: TSH: 0.618 u[IU]/mL (ref 0.350–4.500)

## 2022-10-23 LAB — CK: Total CK: 28 U/L — ABNORMAL LOW (ref 38–234)

## 2022-10-23 MED ORDER — LACTATED RINGERS IV SOLN: INTRAVENOUS | Status: AC

## 2022-10-23 MED ORDER — APIXABAN 2.5 MG PO TABS
2.5000 mg | ORAL_TABLET | Freq: Two times a day (BID) | ORAL | Status: DC
  Administered 2022-10-23 – 2022-10-25 (×5): 2.5 mg via ORAL
  Filled 2022-10-23 (×4): qty 1

## 2022-10-23 MED ORDER — LACTATED RINGERS IV SOLN
INTRAVENOUS | Status: DC
Start: 2022-10-23 — End: 2022-10-23

## 2022-10-23 NOTE — Plan of Care (Signed)

## 2022-10-23 NOTE — Progress Notes (Signed)
Progress Note   Patient: Brenda Matthews ZOX:[id number] DOB: 01-28-1935 DOA: 10/22/2022     0 DOS: the patient was seen and examined on 10/23/2022   Brief hospital course: 87 y.o. female with medical history significant for hypertension, hyperlipidemia, anxiety, PAF on Eliquis, chronic HFpEF, CKD 3B, and recent COVID-19 infection who presents with generalized weakness.   Patient reports that she contracted COVID-19 roughly 2 weeks ago, had mild respiratory symptoms, loss of appetite, and fatigue.  Respiratory symptoms resolved, appetite has improved, but she continues to has developed severe generalized weakness that has made it difficult for her to perform ADLs at home.  She had a minor fall onto her bottom 2 or 3 days ago but denies any pain related to that.  She has continued to take her Lasix as prescribed.  She reports history of bilateral lower extremity swelling, but none recently.   [facility] ED Course: Upon arrival to the ED, patient is found to be afebrile and saturating upper 80s to upper 90s on room air with normal heart rate and stable blood pressure.  Labs are most notable for BUN 53, creatinine 2.51, AST 44, and ALT 57.  COVID-19 PCR was negative.  Assessment and Plan: 1. AKI superimposed on CKD 3B  - BUN is 53 and SCr 2.51 in ED, up from 25 & 1.50 in July 2024   - Likely prerenal in setting of recent illness with anorexia, continued Lasix use  - Held Lasix and Jardiance, receiving gentle IVF hydration -Cr has improved, not yet at baseline Cr -Recheck bmet in AM   2. General weakness  - Likely deconditioning from recent illness  - TSH within normal lmits -PT consulted. No PT needs identified   3. PAF  - Continue amiodarone and Eliquis     4. Chronic HFpEF  - Hold Lasix, monitor weight and I/Os   -recheck bmet in AM   5. Hypertension  - Continue hydralazine and metoprolol    6. Elevated transaminases  - Transaminases mildly elevated  - Abdominal exam benign, LFT's improved    Subjective: Reports feeling eager to go home soon  Physical Exam: Vitals:   10/23/22 1125 10/23/22 1205 10/23/22 1633 10/23/22 1644  BP:  133/71    Pulse:  60    Resp:  18    Temp:  97.7 F (36.5 C)    TempSrc:      SpO2: 93% 91% (!) 88% 94%  Weight:      Height:       General exam: Awake, laying in bed, in nad Respiratory system: Normal respiratory effort, no wheezing Cardiovascular system: regular rate, s1, s2 Gastrointestinal system: Soft, nondistended, positive BS Central nervous system: CN2-12 grossly intact, strength intact Extremities: Perfused, no clubbing Skin: Normal skin turgor, no notable skin lesions seen Psychiatry: Mood normal // no visual hallucinations   Data Reviewed:  Labs reviewed: Na 137, K 3.7, Cr 2.05  Family Communication: Pt in room, family not at bedside  Disposition: Status is: Observation The patient will require care spanning > 2 midnights and should be moved to inpatient because: Severity of illness  Planned Discharge Destination: Home   Author: Rickey Barbara, MD 10/23/2022 4:46 PM  For on call review [email or web address].

## 2022-10-23 NOTE — Evaluation (Signed)
Physical Therapy Evaluation Patient Details Name: Brenda Matthews MRN: [id number] DOB: [birth year] Today's Date: 10/23/2022   SATURATION QUALIFICATIONS: (This note is used to comply with regulatory documentation for home oxygen)  Patient Saturations on Room Air at Rest = 95%  Patient Saturations on Room Air while Ambulating = 89%      History of Present Illness  87 yo female admitted with acute renal failure. Hx of CHF,CKD, falls, anxiety, COVID, osteopenia, DDD  Clinical Impression  On eval, pt was Supv-Mod Ind with mobility. She walked ~125 feet with a RW. She tolerated activity well. Recommend daily ambulation in hallway with nursing and/or mobility team.        If plan is discharge home, recommend the following:     Can travel by private vehicle        Equipment Recommendations None recommended by PT  Recommendations for Other Services       Functional Status Assessment Patient has had a recent decline in their functional status and demonstrates the ability to make significant improvements in function in a reasonable and predictable amount of time.     Precautions / Restrictions Precautions Precautions: Fall Precaution Comments: monitor O2 Restrictions Weight Bearing Restrictions: No      Mobility  Bed Mobility Overal bed mobility: Modified Independent                  Transfers Overall transfer level: Modified independent Equipment used: Rolling walker (2 wheels)                    Ambulation/Gait Ambulation/Gait assistance: Supervision Gait Distance (Feet): 125 Feet Assistive device: Rolling walker (2 wheels) Gait Pattern/deviations: Step-through pattern       General Gait Details: Tolerated distance well. O2 89% on RA  Stairs            Wheelchair Mobility     Tilt Bed    Modified Rankin (Stroke Patients Only)       Balance Overall balance assessment: History of Falls                                            Pertinent Vitals/Pain Pain Assessment Pain Assessment: No/denies pain    Home Living   Living Arrangements: Alone Available Help at Discharge: Available PRN/intermittently Type of Home: Independent living facility Home Access: Level entry       Home Layout: One level Home Equipment: Agricultural consultant (2 wheels);Rollator (4 wheels);Cane - single point Additional Comments: ILF at [facility]    Prior Function Prior Level of Function : Independent/Modified Independent             Mobility Comments: using rollator ADLs Comments: IADLs     Extremity/Trunk Assessment   Upper Extremity Assessment Upper Extremity Assessment: Overall WFL for tasks assessed    Lower Extremity Assessment Lower Extremity Assessment: Generalized weakness    Cervical / Trunk Assessment Cervical / Trunk Assessment: Normal  Communication   Communication Communication: No apparent difficulties  Cognition Arousal: Alert Behavior During Therapy: WFL for tasks assessed/performed Overall Cognitive Status: Within Functional Limits for tasks assessed                                          General Comments  Exercises     Assessment/Plan    PT Assessment Patient needs continued PT services  PT Problem List Decreased strength;Decreased activity tolerance;Decreased balance;Decreased mobility       PT Treatment Interventions DME instruction;Gait training;Functional mobility training;Therapeutic exercise;Therapeutic activities;Patient/family education;Balance training    PT Goals (Current goals can be found in the Care Plan section)  Acute Rehab PT Goals Patient Stated Goal: home PT Goal Formulation: With patient Time For Goal Achievement: 11/06/22 Potential to Achieve Goals: Good    Frequency Min 1X/week     Co-evaluation               AM-PAC PT "6 Clicks" Mobility  Outcome Measure Help needed turning from your back to your side while in a  flat bed without using bedrails?: None Help needed moving from lying on your back to sitting on the side of a flat bed without using bedrails?: None Help needed moving to and from a bed to a chair (including a wheelchair)?: None Help needed standing up from a chair using your arms (e.g., wheelchair or bedside chair)?: None Help needed to walk in hospital room?: A Little Help needed climbing 3-5 steps with a railing? : A Little 6 Click Score: 22    End of Session Equipment Utilized During Treatment: Gait belt Activity Tolerance: Patient tolerated treatment well Patient left: in chair;with call bell/phone within reach   PT Visit Diagnosis: Difficulty in walking, not elsewhere classified (R26.2)    Time: 9604-5409 PT Time Calculation (min) (ACUTE ONLY): 17 min   Charges:   PT Evaluation $PT Eval Low Complexity: 1 Low   PT General Charges $$ ACUTE PT VISIT: 1 Visit           Faye Ramsay, PT Acute Rehabilitation  Office: [phone]

## 2022-10-23 NOTE — Evaluation (Signed)
Occupational Therapy Evaluation Patient Details Name: Brenda Matthews MRN: [id number] DOB: February 10, 1935 Today's Date: 10/23/2022   History of Present Illness Ms. Peraino is a 87 yr old female brought to the hospital with weakness, which has been ongoing since she was diagnosed with COVID-19 ~2 weeks ago. PMH: HTN, HLD, anxiety, a fib, chronic heart failure, CKD 3, lumbar DDD, L radius fracture, OA of knees   Clinical Impression   The pt appears to be very near to her baseline level of functioning for self-care management, as she performed all assessed tasks with modified independence or better, including lower body dressing, sit to stand, ambulating in her room using a RW, simulated toileting, and sit to supine. Her O2 saturation was noted to be 95% on room air at rest and 89% on room air with activity; she denied feelings of shortness of breath with activity. OT will sign off and recommend she return to her ILF at discharge.        If plan is discharge home, recommend the following: Assistance with cooking/housework    Functional Status Assessment  Patient has not had a recent decline in their functional status  Equipment Recommendations  None recommended by OT    Recommendations for Other Services       Precautions / Restrictions Precautions Precautions: Fall Precaution Comments: monitor O2 Restrictions Weight Bearing Restrictions: No      Mobility Bed Mobility   Bed Mobility: Sit to Supine       Sit to supine: Modified independent (Device/Increase time)        Transfers Overall transfer level: Modified independent Equipment used: Rolling walker (2 wheels) Transfers: Sit to/from Stand                    Balance Overall balance assessment: Mild deficits observed, not formally tested           ADL either performed or assessed with clinical judgement   ADL Overall ADL's : Modified independent;Independent;At baseline           Vision   Additional  Comments: She correctly read the time depicted on the wall clock.            Pertinent Vitals/Pain Pain Assessment Pain Assessment: No/denies pain     Extremity/Trunk Assessment Upper Extremity Assessment Upper Extremity Assessment: Overall WFL for tasks assessed   Lower Extremity Assessment Lower Extremity Assessment: Overall WFL for tasks assessed      Communication Communication Communication: No apparent difficulties   Cognition Arousal: Alert Behavior During Therapy: WFL for tasks assessed/performed Overall Cognitive Status: Within Functional Limits for tasks assessed            General Comments: Oriented x4                Home Living   Living Arrangements: Alone  Type of Home: Independent living facility [facility]) Home Access: Elevator     Home Layout: One level     Bathroom Shower/Tub: Arts development officer Toilet: Handicapped height     Home Equipment: Shower seat;Cane - single point;Rollator (4 wheels)   Additional Comments: ILF at [facility]      Prior Functioning/Environment Prior Level of Function : Modified independent/Independent             Mobility Comments: She used a cane vs. rollator for ambulation. ADLs Comments: She reported being independent with ADLs and driving. Most meals are provided by the facility.  OT Treatment/Interventions:   N/A      OT Frequency:  (N/A)    AM-PAC OT "6 Clicks" Daily Activity     Outcome Measure Help from another person eating meals?: None Help from another person taking care of personal grooming?: None Help from another person toileting, which includes using toliet, bedpan, or urinal?: None Help from another person bathing (including washing, rinsing, drying)?: None Help from another person to put on and taking off regular upper body clothing?: None Help from another person to put on and taking off regular lower body clothing?: None 6 Click Score: 24   End  of Session Equipment Utilized During Treatment: Rolling walker (2 wheels) Nurse Communication: Mobility status  Activity Tolerance: Patient tolerated treatment well Patient left: in bed;with call bell/phone within reach;with bed alarm set  OT Visit Diagnosis: Muscle weakness (generalized) (M62.81)                Time: 4854-6270 OT Time Calculation (min): 18 min Charges:  OT General Charges $OT Visit: 1 Visit OT Evaluation $OT Eval Low Complexity: 1 Low    Aaronjames Kelsay L Khian Remo, OTR/L 10/23/2022, 1:42 PM

## 2022-10-23 NOTE — Hospital Course (Signed)
87 y.o. female with medical history significant for hypertension, hyperlipidemia, anxiety, PAF on Eliquis, chronic HFpEF, CKD 3B, and recent COVID-19 infection who presents with generalized weakness.   Patient reports that she contracted COVID-19 roughly 2 weeks ago, had mild respiratory symptoms, loss of appetite, and fatigue.  Respiratory symptoms resolved, appetite has improved, but she continues to has developed severe generalized weakness that has made it difficult for her to perform ADLs at home.  She had a minor fall onto her bottom 2 or 3 days ago but denies any pain related to that.  She has continued to take her Lasix as prescribed.  She reports history of bilateral lower extremity swelling, but none recently.   [facility] ED Course: Upon arrival to the ED, patient is found to be afebrile and saturating upper 80s to upper 90s on room air with normal heart rate and stable blood pressure.  Labs are most notable for BUN 53, creatinine 2.51, AST 44, and ALT 57.  COVID-19 PCR was negative.

## 2022-10-24 DIAGNOSIS — R7989 Other specified abnormal findings of blood chemistry: Secondary | ICD-10-CM | POA: Diagnosis not present

## 2022-10-24 DIAGNOSIS — N179 Acute kidney failure, unspecified: Secondary | ICD-10-CM | POA: Diagnosis not present

## 2022-10-24 DIAGNOSIS — R531 Weakness: Secondary | ICD-10-CM | POA: Diagnosis not present

## 2022-10-24 LAB — COMPREHENSIVE METABOLIC PANEL
ALT: 43 U/L (ref 0–44)
AST: 28 U/L (ref 15–41)
Albumin: 2.6 g/dL — ABNORMAL LOW (ref 3.5–5.0)
Alkaline Phosphatase: 90 U/L (ref 38–126)
Anion gap: 10 (ref 5–15)
BUN: 39 mg/dL — ABNORMAL HIGH (ref 8–23)
CO2: 23 mmol/L (ref 22–32)
Calcium: 9.3 mg/dL (ref 8.9–10.3)
Chloride: 107 mmol/L (ref 98–111)
Creatinine, Ser: 1.51 mg/dL — ABNORMAL HIGH (ref 0.44–1.00)
GFR, Estimated: 34 mL/min — ABNORMAL LOW (ref >60)
Glucose, Bld: 95 mg/dL (ref 70–99)
Potassium: 3.8 mmol/L (ref 3.5–5.1)
Sodium: 140 mmol/L (ref 135–145)
Total Bilirubin: 0.7 mg/dL (ref 0.3–1.2)
Total Protein: 6 g/dL — ABNORMAL LOW (ref 6.5–8.1)

## 2022-10-24 NOTE — Progress Notes (Signed)
Progress Note   Patient: Brenda Matthews [id number]:[id number] DOB: [birth year] DOA: 10/22/2022     1 DOS: the patient was seen and examined on 10/24/2022   Brief hospital course: 87 y.o. female with medical history significant for hypertension, hyperlipidemia, anxiety, PAF on Eliquis, chronic HFpEF, CKD 3B, and recent COVID-19 infection who presents with generalized weakness.   Patient reports that she contracted COVID-19 roughly 2 weeks ago, had mild respiratory symptoms, loss of appetite, and fatigue.  Respiratory symptoms resolved, appetite has improved, but she continues to has developed severe generalized weakness that has made it difficult for her to perform ADLs at home.  She had a minor fall onto her bottom 2 or 3 days ago but denies any pain related to that.  She has continued to take her Lasix as prescribed.  She reports history of bilateral lower extremity swelling, but none recently.   [facility] ED Course: Upon arrival to the ED, patient is found to be afebrile and saturating upper 80s to upper 90s on room air with normal heart rate and stable blood pressure.  Labs are most notable for BUN 53, creatinine 2.51, AST 44, and ALT 57.  COVID-19 PCR was negative.  Assessment and Plan: 1. AKI superimposed on CKD 3B  - BUN is 53 and SCr 2.51 in ED, up from 25 & 1.50 in July 2024   - Likely prerenal in setting of recent illness with anorexia, continued Lasix use  - Held Lasix and Jardiance, pt received gentle IVF hydration -Cr has improved -Recheck bmet in AM   2. General weakness  - Likely deconditioning from recent illness  - TSH within normal lmits -PT consulted. No PT needs identified   3. PAF  - Continue amiodarone and Eliquis     4. Chronic HFpEF  - Lasix was held, wt remains stable -recheck bmet in AM -wean O2 as tolerated. Will check sats on ambulation   5. Hypertension  - Continue hydralazine and metoprolol    6. Elevated transaminases  - Transaminases mildly elevated  - Abdominal  exam benign, LFT's improved   Subjective: Feeling slightly worse today. Denies sob at this time  Physical Exam: Vitals:   10/23/22 1644 10/23/22 1928 10/24/22 0434 10/24/22 0500  BP:  (!) 146/64 (!) 135/54   Pulse:  (!) 55 (!) 55   Resp:      Temp:  97.9 F (36.6 C) 97.8 F (36.6 C)   TempSrc:  Oral Oral   SpO2: 94% 92% 96%   Weight:    77.4 kg  Height:       General exam: Conversant, in no acute distress Respiratory system: normal chest rise, clear, no audible wheezing Cardiovascular system: regular rhythm, s1-s2 Gastrointestinal system: Nondistended, nontender, pos BS Central nervous system: No seizures, no tremors Extremities: No cyanosis, no joint deformities Skin: No rashes, no pallor Psychiatry: Affect normal // no auditory hallucinations   Data Reviewed:  Labs reviewed: Na 140, K 3.8, Cr 1.51  Family Communication: Pt in room, family not at bedside  Disposition: Status is: Inpatient Continue inpatient stay because: Severity of illness  Planned Discharge Destination: Home   Author: Rickey Barbara, MD 10/24/2022 4:09 PM  For on call review [email or web address].

## 2022-10-24 NOTE — Progress Notes (Signed)
Mobility Specialist - Progress Note   10/24/22 1120  Mobility  Activity Stood at bedside  Level of Assistance Modified independent, requires aide device or extra time  Assistive Device Front wheel walker  Activity Response Tolerated well  Mobility Referral Yes  $Mobility charge 1 Mobility  Mobility Specialist Start Time (ACUTE ONLY) 1114  Mobility Specialist Stop Time (ACUTE ONLY) 1120  Mobility Specialist Time Calculation (min) (ACUTE ONLY) 6 min   Pt received in recliner declining mobility but agreeable to do STS. Pt tolerated x5 STS while stretching legs & arms out. No complaints during session. Pt did have a coughing spell towards EOS. Pt to recliner after session with all needs met.   [facility]

## 2022-10-24 NOTE — TOC CM/SW Note (Signed)
Transition of Care [facility]) - Inpatient Brief Assessment   Patient Details  Name: TIZIANA CISLO MRN: [id number] Date of Birth: 1935/10/16  Transition of Care [facility]) CM/SW Contact:    Otelia Santee, LCSW Phone Number: 10/24/2022, 9:08 AM   Clinical Narrative: Pt from [facility] ILF with plans to return at discharge. No TOC needs identified.    Transition of Care Asessment: Insurance and Status: Insurance coverage has been reviewed Patient has primary care physician: Yes Home environment has been reviewed: ILF at [facility] Prior level of function:: Modified indpendent Prior/Current Home Services: No current home services Social Determinants of Health Reivew: SDOH reviewed no interventions necessary Readmission risk has been reviewed: Yes Transition of care needs: no transition of care needs at this time

## 2022-10-24 NOTE — Plan of Care (Signed)

## 2022-10-25 ENCOUNTER — Inpatient Hospital Stay (HOSPITAL_COMMUNITY): Payer: Medicare PPO

## 2022-10-25 DIAGNOSIS — R7989 Other specified abnormal findings of blood chemistry: Secondary | ICD-10-CM | POA: Diagnosis not present

## 2022-10-25 DIAGNOSIS — N179 Acute kidney failure, unspecified: Secondary | ICD-10-CM | POA: Diagnosis not present

## 2022-10-25 DIAGNOSIS — R531 Weakness: Secondary | ICD-10-CM | POA: Diagnosis not present

## 2022-10-25 LAB — COMPREHENSIVE METABOLIC PANEL
ALT: 42 U/L (ref 0–44)
AST: 27 U/L (ref 15–41)
Albumin: 2.7 g/dL — ABNORMAL LOW (ref 3.5–5.0)
Alkaline Phosphatase: 91 U/L (ref 38–126)
Anion gap: 13 (ref 5–15)
BUN: 38 mg/dL — ABNORMAL HIGH (ref 8–23)
CO2: 21 mmol/L — ABNORMAL LOW (ref 22–32)
Calcium: 9.9 mg/dL (ref 8.9–10.3)
Chloride: 106 mmol/L (ref 98–111)
Creatinine, Ser: 1.38 mg/dL — ABNORMAL HIGH (ref 0.44–1.00)
GFR, Estimated: 38 mL/min — ABNORMAL LOW (ref >60)
Glucose, Bld: 99 mg/dL (ref 70–99)
Potassium: 3.6 mmol/L (ref 3.5–5.1)
Sodium: 140 mmol/L (ref 135–145)
Total Bilirubin: 0.7 mg/dL (ref 0.3–1.2)
Total Protein: 6.3 g/dL — ABNORMAL LOW (ref 6.5–8.1)

## 2022-10-25 MED ORDER — APIXABAN 5 MG PO TABS
5.0000 mg | ORAL_TABLET | Freq: Two times a day (BID) | ORAL | Status: DC
  Administered 2022-10-25 – 2022-10-26 (×2): 5 mg via ORAL
  Filled 2022-10-25 (×2): qty 1

## 2022-10-25 NOTE — Progress Notes (Signed)
Progress Note   Patient: Brenda Matthews [id number] DOB: 06-Nov-1935 DOA: 10/22/2022     2 DOS: the patient was seen and examined on 10/25/2022   Brief hospital course: 87 y.o. female with medical history significant for hypertension, hyperlipidemia, anxiety, PAF on Eliquis, chronic HFpEF, CKD 3B, and recent COVID-19 infection who presents with generalized weakness.   Patient reports that she contracted COVID-19 roughly 2 weeks ago, had mild respiratory symptoms, loss of appetite, and fatigue.  Respiratory symptoms resolved, appetite has improved, but she continues to has developed severe generalized weakness that has made it difficult for her to perform ADLs at home.  She had a minor fall onto her bottom 2 or 3 days ago but denies any pain related to that.  She has continued to take her Lasix as prescribed.  She reports history of bilateral lower extremity swelling, but none recently.   [facility] ED Course: Upon arrival to the ED, patient is found to be afebrile and saturating upper 80s to upper 90s on room air with normal heart rate and stable blood pressure.  Labs are most notable for BUN 53, creatinine 2.51, AST 44, and ALT 57.  COVID-19 PCR was negative.  Assessment and Plan: 1. AKI superimposed on CKD 3B  - BUN is 53 and SCr 2.51 in ED, up from 25 & 1.50 in July 2024   - Likely prerenal in setting of recent illness with anorexia, continued Lasix use  - Held Lasix and Jardiance, pt received gentle IVF hydration and was encouraged to increase PO intake -Cr is improving -Repeat bmet in AM   2. General weakness  - Likely deconditioning from recent illness  - TSH within normal lmits -PT consulted. No PT needs identified   3. PAF  - Continue amiodarone and Eliquis     4. Chronic HFpEF  - Lasix was held, wt remains stable and unchanged -repeat CXR on 10/8 reviewed, clear with cardiomegaly -now weaned to room air -recheck bmet in AM   5. Hypertension  - Continue hydralazine and metoprolol     6. Elevated transaminases  - Transaminases mildly elevated  - Abdominal exam benign, LFT's improved   Subjective: Feeling better this AM. Earlier in the day, desaturated to 88% on ambulation, however towards end of day, was able to ambulate and maintain sats >90%  Physical Exam: Vitals:   10/24/22 0500 10/24/22 1930 10/25/22 0338 10/25/22 1236  BP:  (!) 147/74 (!) 120/54 (!) 136/51  Pulse:  (!) 59 (!) 50 (!) 57  Resp:   16 17  Temp:  (!) 97.5 F (36.4 C) (!) 97.5 F (36.4 C) (!) 97.4 F (36.3 C)  TempSrc:  Oral Oral Oral  SpO2:  96% 95% 93%  Weight: 77.4 kg  77.3 kg   Height:       General exam: Awake, laying in bed, in nad Respiratory system: Normal respiratory effort, no wheezing Cardiovascular system: regular rate, s1, s2 Gastrointestinal system: Soft, nondistended, positive BS Central nervous system: CN2-12 grossly intact, strength intact Extremities: Perfused, no clubbing Skin: Normal skin turgor, no notable skin lesions seen Psychiatry: Mood normal // no visual hallucinations   Data Reviewed:  Labs reviewed: Na 140, K 3.6, Cr 1.38  Family Communication: Pt in room, family not at bedside  Disposition: Status is: Inpatient Continue inpatient stay because: Severity of illness  Planned Discharge Destination: Home   Author: Rickey Barbara, MD 10/25/2022 6:25 PM  For on call review [email or web address].

## 2022-10-25 NOTE — Progress Notes (Signed)
SATURATION QUALIFICATIONS: (This note is used to comply with regulatory documentation for home oxygen)  Patient Saturations on Room Air at Rest = 93%  Patient Saturations on Room Air while Ambulating = 88%  Patient Saturations on 2 Liters of oxygen while Ambulating = 98%  Please briefly explain why patient needs home oxygen:

## 2022-10-25 NOTE — Plan of Care (Signed)

## 2022-10-25 NOTE — Progress Notes (Signed)
PHYSICAL THERAPY  Pt resting bed, had recently amb with our Mobility Specialist 180 feet.  Pt has been evaluated with no post acute PT recommendations.  Pt plans to return to Ind Liv @ [facility]  Felecia Shelling  PTA Acute  Rehabilitation Services Office M-F          [phone]

## 2022-10-25 NOTE — Progress Notes (Signed)
Mobility Specialist - Progress Note  Pre-mobility: 67 bpm HR, 95% SpO2 During mobility: 72 bpm HR, 90% SpO2 Post-mobility: 69 bpm HR, 92% SPO2    10/25/22 1433  Oxygen Therapy  O2 Device Room Air  Patient Activity (if Appropriate) Ambulating  Mobility  Activity Ambulated with assistance in hallway  Level of Assistance Standby assist, set-up cues, supervision of patient - no hands on  Assistive Device Front wheel walker  Distance Ambulated (ft) 180 ft  Range of Motion/Exercises Active  Activity Response Tolerated fair  Mobility Referral Yes  $Mobility charge 1 Mobility  Mobility Specialist Start Time (ACUTE ONLY) 1415  Mobility Specialist Stop Time (ACUTE ONLY) 1433  Mobility Specialist Time Calculation (min) (ACUTE ONLY) 18 min   Pt was found on recliner chair and agreeable to ambulate. Stated being fatigued today due to poor sleep last night. Required cues to keep her gaze up rather than looking straight at the ground. At EOS returned to bed with all needs met. Call bell in reach.  Billey Chang Mobility Specialist

## 2022-10-25 NOTE — Plan of Care (Signed)
  Problem: Health Behavior/Discharge Planning: Goal: Ability to manage health-related needs will improve Outcome: Progressing   

## 2022-10-26 DIAGNOSIS — N1832 Chronic kidney disease, stage 3b: Secondary | ICD-10-CM | POA: Diagnosis not present

## 2022-10-26 DIAGNOSIS — N179 Acute kidney failure, unspecified: Secondary | ICD-10-CM | POA: Diagnosis not present

## 2022-10-26 LAB — COMPREHENSIVE METABOLIC PANEL
ALT: 43 U/L (ref 0–44)
AST: 28 U/L (ref 15–41)
Albumin: 2.7 g/dL — ABNORMAL LOW (ref 3.5–5.0)
Alkaline Phosphatase: 94 U/L (ref 38–126)
Anion gap: 9 (ref 5–15)
BUN: 29 mg/dL — ABNORMAL HIGH (ref 8–23)
CO2: 22 mmol/L (ref 22–32)
Calcium: 9.4 mg/dL (ref 8.9–10.3)
Chloride: 108 mmol/L (ref 98–111)
Creatinine, Ser: 1.18 mg/dL — ABNORMAL HIGH (ref 0.44–1.00)
GFR, Estimated: 45 mL/min — ABNORMAL LOW (ref >60)
Glucose, Bld: 121 mg/dL — ABNORMAL HIGH (ref 70–99)
Potassium: 3.8 mmol/L (ref 3.5–5.1)
Sodium: 139 mmol/L (ref 135–145)
Total Bilirubin: 0.6 mg/dL (ref 0.3–1.2)
Total Protein: 6.3 g/dL — ABNORMAL LOW (ref 6.5–8.1)

## 2022-10-26 NOTE — [place] FL2 (Signed)
[place] MEDICAID FL2 LEVEL OF CARE FORM     IDENTIFICATION  Patient Name: Brenda Matthews Birthdate: 30-Jun-1935 Sex: female Admission Date (Current Location): 10/22/2022  [facility] and [place] Number:  Producer, television/film/video and Address:  [facility],  [address] [place], [place] [address]      Provider Number: [id number]  Attending Physician Name and Address:  Tyrone Nine, MD  Relative Name and Phone Number:       Current Level of Care: Hospital Recommended Level of Care: Skilled Nursing Facility Prior Approval Number:    Date Approved/Denied:   PASRR Number: [id number]  Discharge Plan: SNF    Current Diagnoses: Patient Active Problem List   Diagnosis Date Noted   ARF (acute renal failure) (HCC) 10/23/2022   Acute renal failure superimposed on stage 3b chronic kidney disease (HCC) 10/22/2022   Chronic heart failure with preserved ejection fraction (HFpEF) (HCC) 10/22/2022   Elevated transaminase level 10/22/2022   Physical deconditioning 06/14/2022   Acute on chronic diastolic (congestive) heart failure (HCC) 06/11/2022   Idiopathic acute pancreatitis without necrosis or infection 09/14/2020   Pancreatic cyst 09/14/2020   Pancreatic duct dilated 09/14/2020   Pure hypercholesterolemia 09/14/2020   Hypertensive heart and chronic kidney disease with heart failure and stage 1 through stage 4 chronic kidney disease, or unspecified chronic kidney disease (HCC) 09/28/2018   Spinal stenosis, lumbar region with neurogenic claudication 09/26/2018   Osteoarthritis of right knee 11/21/2017   Acute on chronic diastolic CHF (congestive heart failure) (HCC)    S/P thoracentesis    Acute respiratory failure (HCC)    Rash in adult 08/30/2017   Atrial fibrillation and flutter (HCC)    DOE (dyspnea on exertion) 08/29/2017   Arterial hypotension    Chronic anticoagulation 12/19/2016   Low TSH level 02/19/2016   PAF (paroxysmal atrial fibrillation) (HCC) 02/18/2016   Ankle  fracture, left-s/p surgery 02/18/16 02/18/2016   Closed displaced trimalleolar fracture of left ankle 02/18/2016   Acute bronchitis 02/10/2015   Bronchopneumonia 12/02/2013   Chronic kidney disease, stage 3b (HCC) 12/04/2012   Hx of adenomatous colonic polyps    Routine general medical examination at a health care facility 09/05/2011   Cervical cancer screening 02/24/2011   SCIATICA, BILATERAL 09/01/2009   Sprain of sacroiliac region 09/01/2009   ANEMIA 11/07/2007   Osteopenia of the elderly 11/07/2007   INSOMNIA 11/07/2007   EDEMA 08/14/2007   Hyperlipidemia 04/04/2007   PANIC DISORDER 04/04/2007   Essential hypertension 04/04/2007   Anxiety state 12/27/2006   Arthropathy 12/27/2006    Orientation RESPIRATION BLADDER Height & Weight     Self, Time, Situation, Place  Normal Continent Weight: 171 lb 1.2 oz (77.6 kg) Height:  5' 1.5" (156.2 cm)  BEHAVIORAL SYMPTOMS/MOOD NEUROLOGICAL BOWEL NUTRITION STATUS      Continent Diet (Regular)  AMBULATORY STATUS COMMUNICATION OF NEEDS Skin   Supervision Verbally Normal                       Personal Care Assistance Level of Assistance  Bathing, Feeding, Dressing Bathing Assistance: Independent Feeding assistance: Independent Dressing Assistance: Independent     Functional Limitations Info  Sight, Hearing, Speech Sight Info: Impaired Hearing Info: Impaired Speech Info: Adequate    SPECIAL CARE FACTORS FREQUENCY  PT (By licensed PT), OT (By licensed OT)     PT Frequency: 5x/wk OT Frequency: 5x/wk            Contractures Contractures Info: Not present  Additional Factors Info  Code Status, Allergies Code Status Info: FULL Allergies Info: Augmentin (Amoxicillin-pot Clavulanate), Amoxicillin, Clindamycin/lincomycin           Current Medications (10/26/2022):  This is the current hospital active medication list Current Facility-Administered Medications  Medication Dose Route Frequency Provider Last Rate Last  Admin   acetaminophen (TYLENOL) tablet 650 mg  650 mg Oral Q6H PRN Opyd, Lavone Neri, MD   650 mg at 10/25/22 2122   Or   acetaminophen (TYLENOL) suppository 650 mg  650 mg Rectal Q6H PRN Opyd, Lavone Neri, MD       albuterol (PROVENTIL) (2.5 MG/3ML) 0.083% nebulizer solution 3 mL  3 mL Inhalation Q4H PRN Opyd, Lavone Neri, MD   3 mL at 10/24/22 2118   ALPRAZolam Prudy Feeler) tablet 0.5 mg  0.5 mg Oral TID PRN Briscoe Deutscher, MD   0.5 mg at 10/25/22 2122   amiodarone (PACERONE) tablet 200 mg  200 mg Oral Daily Opyd, Lavone Neri, MD   200 mg at 10/26/22 0908   apixaban (ELIQUIS) tablet 5 mg  5 mg Oral BID Jerald Kief, MD   5 mg at 10/26/22 0908   atorvastatin (LIPITOR) tablet 40 mg  40 mg Oral Daily Opyd, Lavone Neri, MD   40 mg at 10/26/22 1324   hydrALAZINE (APRESOLINE) tablet 100 mg  100 mg Oral BID Briscoe Deutscher, MD   100 mg at 10/26/22 4010   metoprolol tartrate (LOPRESSOR) tablet 100 mg  100 mg Oral BID Briscoe Deutscher, MD   100 mg at 10/26/22 2725   senna-docusate (Senokot-S) tablet 1 tablet  1 tablet Oral QHS PRN Opyd, Lavone Neri, MD         Discharge Medications: Please see discharge summary for a list of discharge medications.  Relevant Imaging Results:  Relevant Lab Results:   Additional Information SS#[id number] LCSW

## 2022-10-26 NOTE — TOC Transition Note (Addendum)
Transition of Care [facility]) - CM/SW Discharge Note   Patient Details  Name: Brenda Matthews MRN: [id number] Date of Birth: 05/22/1935  [facility] CM/SW Contact:  Otelia Santee, LCSW Phone Number: 10/26/2022, 9:01 AM   Clinical Narrative:    Pt returning to [facility]. Pt reports having no one able to pick her up and states she has no money with her to pay for a ride home. Ride waiver signed and placed in chart. Taxi voucher provided to RN for transportation at discharge.   ADDENDUM: CSW spoke with Kenard Gower at [facility] who shares that pt has been expressing need for rehab to staff at [facility]. They have requested insurance authorization and plan to admit pt to their SNF for a week stay at discharge. [facility] to provide transportation for pt and will be picking pt up at 2pm.    Final next level of care: Home/Self Care Barriers to Discharge: No Barriers Identified   Patient Goals and CMS Choice      Discharge Placement                         Discharge Plan and Services Additional resources added to the After Visit Summary for                                       Social Determinants of Health (SDOH) Interventions SDOH Screenings   Food Insecurity: No Food Insecurity (10/22/2022)  Housing: Patient Declined (10/22/2022)  Transportation Needs: No Transportation Needs (10/22/2022)  Utilities: Not At Risk (10/22/2022)  Alcohol Screen: Low Risk  (10/11/2020)  Depression (PHQ2-9): Low Risk  (09/01/2022)  Financial Resource Strain: Low Risk  (10/27/2021)  Physical Activity: Sufficiently Active (10/27/2021)  Social Connections: Moderately Integrated (10/27/2021)  Stress: No Stress Concern Present (10/27/2021)  Tobacco Use: Low Risk  (10/22/2022)     Readmission Risk Interventions    10/24/2022    9:08 AM  Readmission Risk Prevention Plan  Post Dischage Appt Complete  Medication Screening Complete  Transportation Screening Complete

## 2022-10-26 NOTE — Discharge Summary (Signed)
Physician Discharge Summary   Patient: Brenda Matthews MRN: [id number] DOB: 1935/08/11  Admit date:     10/22/2022  Discharge date: 10/26/22  Discharge Physician: Tyrone Nine   PCP: Jeoffrey Massed, MD   Recommendations at discharge:  Follow up with cardiology, Dr. Rennis Golden in next 2-3 weeks.  Follow up with PCP next week as arranged with repeat CMP and clinical evaluation.  Discharge Diagnoses: Principal Problem:   Acute renal failure superimposed on stage 3b chronic kidney disease (HCC) Active Problems:   PAF (paroxysmal atrial fibrillation) (HCC)   Physical deconditioning   Essential hypertension   Chronic heart failure with preserved ejection fraction (HFpEF) (HCC)   Elevated transaminase level   ARF (acute renal failure) [facility])  Hospital Course: Brenda Matthews is an 87 y.o. female with medical history significant for hypertension, hyperlipidemia, anxiety, PAF on Eliquis, chronic HFpEF, CKD 3B, and recent COVID-19 infection who presents with generalized weakness.   Patient reports that she contracted COVID-19 roughly 2 weeks ago, had mild respiratory symptoms, loss of appetite, and fatigue.  Respiratory symptoms resolved, appetite has improved, but she continues to has developed severe generalized weakness that has made it difficult for her to perform ADLs at home.  She had a minor fall onto her bottom 2 or 3 days ago but denies any pain related to that.  She has continued to take her Lasix as prescribed.   [facility] ED Course: Upon arrival to the ED, patient is found to be afebrile and saturating upper 80s to upper 90s on room air with normal heart rate and stable blood pressure.  Labs are most notable for BUN 53, creatinine 2.51, AST 44, and ALT 57.  COVID-19 PCR was negative.   She was admitted for AKI and diuretic was held with steady improvement. She has returned to baseline kidney function and is now starting to develop some LE edema. Lasix is restarted. She is medically stabilized for  discharge, and will receive physical therapy at ILF.   Assessment and Plan: 1. AKI superimposed on CKD 3B  - BUN is 53 and SCr 2.51 in ED, up from 25 & 1.50 in July 2024. Cr down to 1.18 on day of discharge.  - Likely prerenal in setting of recent illness with anorexia, continued Lasix use  - Held Lasix and Jardiance, pt received gentle IVF hydration and was encouraged to increase PO intake. Ok to restart meds now.    2. General weakness  - Likely deconditioning from recent illness  - TSH within normal lmits - Will receive PT at her facility.    3. PAF  - Continue amiodarone and eliquis     4. Chronic HFpEF  - Lasix was held, wt remains stable (170lbs >> 171lbs) -repeat CXR on 10/8 reviewed, clear with cardiomegaly -now weaned to room air -recheck bmet in AM   5. Hypertension  - Continue hydralazine and metoprolol    6. Elevated transaminases  - Transaminases mildly elevated  - Abdominal exam benign, LFT's improved  Consultants: None Procedures performed: None  Disposition: Return to ILF Diet recommendation:  Cardiac diet DISCHARGE MEDICATION: Allergies as of 10/26/2022       Reactions   Augmentin [amoxicillin-pot Clavulanate] Nausea And Vomiting, Other (See Comments)   "projectile vomiting" Has patient had a PCN reaction causing immediate rash, facial/tongue/throat swelling, SOB or lightheadedness with hypotension:No Has patient had a PCN reaction causing severe rash involving mucus membranes or skin necrosis:No Has patient had a PCN reaction that required  hospitalization:No Has patient had a PCN reaction occurring within the last 10 years:Yes If all of the above answers are "NO", then may proceed with Cephalosporin use.   Amoxicillin Rash   Clindamycin/lincomycin Rash        Medication List     TAKE these medications    acetaminophen 650 MG CR tablet Commonly known as: TYLENOL Take 1,300 mg by mouth daily.   albuterol 108 (90 Base) MCG/ACT  inhaler Commonly known as: VENTOLIN HFA INHALE 2 PUFFS BY MOUTH into THE lungs EVERY 4 HOURS AS NEEDED FOR WHEEZING OR SHORTNESS OF BREATH What changed:  how much to take how to take this when to take this reasons to take this additional instructions   ALPRAZolam 0.5 MG tablet Commonly known as: XANAX Take 1 tablet (0.5 mg total) by mouth 3 (three) times daily as needed for anxiety. TAKE 1 TABLET BY MOUTH 3 TIMES DAILY AS NEEDED FOR ANXIETY What changed: additional instructions   amiodarone 200 MG tablet Commonly known as: PACERONE Take 1 tablet (200 mg total) by mouth daily.   apixaban 5 MG Tabs tablet Commonly known as: ELIQUIS Take 1 tablet (5 mg total) by mouth 2 (two) times daily.   atorvastatin 40 MG tablet Commonly known as: LIPITOR TAKE 1 TABLET BY MOUTH EVERY DAY   Biotin 5000 MCG Tabs Take 5,000 mcg by mouth daily.   CALCIUM 600 PO Take 1,200 mg by mouth daily.   carboxymethylcellulose 0.5 % Soln Commonly known as: REFRESH PLUS Place 2 drops into both eyes daily as needed (dry/irritated eyes.).   Centrum Silver Ultra Womens Tabs Take 1 tablet by mouth every evening.   Coenzyme Q10 200 MG capsule Take 200 mg by mouth daily.   empagliflozin 10 MG Tabs tablet Commonly known as: Jardiance Take 1 tablet (10 mg total) by mouth daily before breakfast.   fluticasone 50 MCG/ACT nasal spray Commonly known as: FLONASE Place 2 sprays into both nostrils daily.   furosemide 20 MG tablet Commonly known as: LASIX Take 1 tablet (20 mg total) by mouth daily.   hydrALAZINE 100 MG tablet Commonly known as: APRESOLINE Take 1 tablet (100 mg total) by mouth 2 (two) times daily. What changed: when to take this   hydrocortisone cream 1 % Apply 1 Application topically as needed for itching.   metoprolol tartrate 100 MG tablet Commonly known as: LOPRESSOR Take 1 tablet (100 mg total) by mouth 2 (two) times daily.   potassium chloride SA 20 MEQ tablet Commonly known  as: KLOR-CON M Take 1 tablet (20 mEq total) by mouth daily.   PREVAGEN PO Take 1 tablet by mouth daily.   VITAMIN C PO Take by mouth daily.        Discharge Exam: Filed Weights   10/24/22 0500 10/25/22 0338 10/26/22 0428  Weight: 77.4 kg 77.3 kg 77.6 kg  BP (!) 145/75 (BP Location: Left Arm)   Pulse (!) 56   Temp (!) 97.5 F (36.4 C) (Oral)   Resp 18   Ht 5' 1.5" (1.562 m)   Wt 77.6 kg   LMP  (LMP Unknown)   SpO2 95%   BMI 31.80 kg/m   Elderly pleasant, well-appearing female in no distress Clear, nonlabored Reg, no MRG, 1+ pitting LE edema, no JVD Soft, NT, ND  Condition at discharge: stable  The results of significant diagnostics from this hospitalization (including imaging, microbiology, ancillary and laboratory) are listed below for reference.   Imaging Studies: DG CHEST PORT 1 VIEW  Result Date: 10/25/2022 CLINICAL DATA:  Congestive heart failure history of generalized weakness per epic EXAM: PORTABLE CHEST 1 VIEW COMPARISON:  10/22/2022 FINDINGS: Mild cardiomegaly. Atelectasis or scarring at the lung bases. Aortic atherosclerosis. No pleural effusion or pneumothorax IMPRESSION: Mild cardiomegaly. Atelectasis or scarring at the lung bases. Electronically Signed   By: Jasmine Pang M.D.   On: 10/25/2022 17:52   DG Chest Port 1 View  Result Date: 10/22/2022 CLINICAL DATA:  Weakness, subacute COVID pneumonia EXAM: PORTABLE CHEST 1 VIEW COMPARISON:  06/20/2022 FINDINGS: Bibasilar atelectasis. Lungs are otherwise clear. No pneumothorax or pleural effusion. Cardiac size within normal limits. Pulmonary vascularity is normal. No acute bone abnormality. IMPRESSION: 1. Bibasilar atelectasis. Electronically Signed   By: Helyn Numbers M.D.   On: 10/22/2022 18:26    Microbiology: Results for orders placed or performed during the hospital encounter of 10/22/22  SARS Coronavirus 2 by RT PCR (hospital order, performed in [facility] hospital lab) *cepheid single result test*  Anterior Nasal Swab     Status: None   Collection Time: 10/22/22  5:05 PM   Specimen: Anterior Nasal Swab  Result Value Ref Range Status   SARS Coronavirus 2 by RT PCR NEGATIVE NEGATIVE Final    Comment: (NOTE) SARS-CoV-2 target nucleic acids are NOT DETECTED.  The SARS-CoV-2 RNA is generally detectable in upper and lower respiratory specimens during the acute phase of infection. The lowest concentration of SARS-CoV-2 viral copies this assay can detect is 250 copies / mL. A negative result does not preclude SARS-CoV-2 infection and should not be used as the sole basis for treatment or other patient management decisions.  A negative result may occur with improper specimen collection / handling, submission of specimen other than nasopharyngeal swab, presence of viral mutation(s) within the areas targeted by this assay, and inadequate number of viral copies (<250 copies / mL). A negative result must be combined with clinical observations, patient history, and epidemiological information.  Fact Sheet for Patients:   RoadLapTop.co.za  Fact Sheet for Healthcare Providers: http://kim-miller.com/  This test is not yet approved or  cleared by the [place] FDA and has been authorized for detection and/or diagnosis of SARS-CoV-2 by FDA under an Emergency Use Authorization (EUA).  This EUA will remain in effect (meaning this test can be used) for the duration of the COVID-19 declaration under Section 564(b)(1) of the Act, 21 U.S.C. section 360bbb-3(b)(1), unless the authorization is terminated or revoked sooner.  Performed at [facility], [address] [place], [place] [address]     Labs: CBC: Recent Labs  Lab 10/22/22 1721 10/23/22 0532  WBC 6.6 6.3  HGB 13.5 12.5  HCT 42.7 39.9  MCV 88.4 90.9  PLT 321 275   Basic Metabolic Panel: Recent Labs  Lab 10/22/22 1721 10/23/22 0532 10/24/22 0504 10/25/22 0530  10/26/22 0537  NA 139 137 140 140 139  K 3.8 3.7 3.8 3.6 3.8  CL 103 104 107 106 108  CO2 22 22 23  21* 22  GLUCOSE 113* 98 95 99 121*  BUN 53* 48* 39* 38* 29*  CREATININE 2.51* 2.05* 1.51* 1.38* 1.18*  CALCIUM 9.5 9.1 9.3 9.9 9.4  MG 2.4  --   --   --   --    Liver Function Tests: Recent Labs  Lab 10/22/22 1721 10/23/22 0532 10/24/22 0504 10/25/22 0530 10/26/22 0537  AST 44* 38 28 27 28   ALT 57* 51* 43 42 43  ALKPHOS 107 96 90 91 94  BILITOT 0.9 0.7 0.7 0.7 0.6  PROT 7.1 6.8 6.0* 6.3* 6.3*  ALBUMIN 3.0* 2.8* 2.6* 2.7* 2.7*   CBG: No results for input(s): "GLUCAP" in the last 168 hours.  Discharge time spent: greater than 30 minutes.  Signed: Tyrone Nine, MD [facility] 10/26/2022

## 2022-10-27 DIAGNOSIS — I509 Heart failure, unspecified: Secondary | ICD-10-CM | POA: Diagnosis not present

## 2022-10-27 DIAGNOSIS — M6281 Muscle weakness (generalized): Secondary | ICD-10-CM | POA: Diagnosis not present

## 2022-10-27 DIAGNOSIS — I130 Hypertensive heart and chronic kidney disease with heart failure and stage 1 through stage 4 chronic kidney disease, or unspecified chronic kidney disease: Secondary | ICD-10-CM | POA: Diagnosis not present

## 2022-10-27 DIAGNOSIS — Z87448 Personal history of other diseases of urinary system: Secondary | ICD-10-CM | POA: Diagnosis not present

## 2022-10-27 DIAGNOSIS — I480 Paroxysmal atrial fibrillation: Secondary | ICD-10-CM | POA: Diagnosis not present

## 2022-10-27 DIAGNOSIS — Z8616 Personal history of COVID-19: Secondary | ICD-10-CM | POA: Diagnosis not present

## 2022-10-27 DIAGNOSIS — N179 Acute kidney failure, unspecified: Secondary | ICD-10-CM | POA: Diagnosis not present

## 2022-10-27 DIAGNOSIS — R2689 Other abnormalities of gait and mobility: Secondary | ICD-10-CM | POA: Diagnosis not present

## 2022-10-27 DIAGNOSIS — I4811 Longstanding persistent atrial fibrillation: Secondary | ICD-10-CM | POA: Diagnosis not present

## 2022-10-27 DIAGNOSIS — N1832 Chronic kidney disease, stage 3b: Secondary | ICD-10-CM | POA: Diagnosis not present

## 2022-10-27 DIAGNOSIS — R600 Localized edema: Secondary | ICD-10-CM | POA: Diagnosis not present

## 2022-10-27 DIAGNOSIS — F419 Anxiety disorder, unspecified: Secondary | ICD-10-CM | POA: Diagnosis not present

## 2022-10-27 DIAGNOSIS — J9601 Acute respiratory failure with hypoxia: Secondary | ICD-10-CM | POA: Diagnosis not present

## 2022-10-27 DIAGNOSIS — I5032 Chronic diastolic (congestive) heart failure: Secondary | ICD-10-CM | POA: Diagnosis not present

## 2022-10-27 DIAGNOSIS — R54 Age-related physical debility: Secondary | ICD-10-CM | POA: Diagnosis not present

## 2022-10-27 DIAGNOSIS — N183 Chronic kidney disease, stage 3 unspecified: Secondary | ICD-10-CM | POA: Diagnosis not present

## 2022-10-28 DIAGNOSIS — R2689 Other abnormalities of gait and mobility: Secondary | ICD-10-CM | POA: Diagnosis not present

## 2022-10-28 DIAGNOSIS — M6281 Muscle weakness (generalized): Secondary | ICD-10-CM | POA: Diagnosis not present

## 2022-10-28 DIAGNOSIS — I5032 Chronic diastolic (congestive) heart failure: Secondary | ICD-10-CM | POA: Diagnosis not present

## 2022-10-31 ENCOUNTER — Telehealth: Payer: Self-pay | Admitting: Family Medicine

## 2022-10-31 DIAGNOSIS — I130 Hypertensive heart and chronic kidney disease with heart failure and stage 1 through stage 4 chronic kidney disease, or unspecified chronic kidney disease: Secondary | ICD-10-CM | POA: Diagnosis not present

## 2022-10-31 DIAGNOSIS — Z7901 Long term (current) use of anticoagulants: Secondary | ICD-10-CM | POA: Diagnosis not present

## 2022-10-31 DIAGNOSIS — M6281 Muscle weakness (generalized): Secondary | ICD-10-CM | POA: Diagnosis not present

## 2022-10-31 DIAGNOSIS — I482 Chronic atrial fibrillation, unspecified: Secondary | ICD-10-CM | POA: Diagnosis not present

## 2022-10-31 DIAGNOSIS — N183 Chronic kidney disease, stage 3 unspecified: Secondary | ICD-10-CM | POA: Diagnosis not present

## 2022-10-31 DIAGNOSIS — I509 Heart failure, unspecified: Secondary | ICD-10-CM | POA: Diagnosis not present

## 2022-10-31 DIAGNOSIS — E785 Hyperlipidemia, unspecified: Secondary | ICD-10-CM | POA: Diagnosis not present

## 2022-10-31 DIAGNOSIS — R2689 Other abnormalities of gait and mobility: Secondary | ICD-10-CM | POA: Diagnosis not present

## 2022-10-31 DIAGNOSIS — Z7982 Long term (current) use of aspirin: Secondary | ICD-10-CM | POA: Diagnosis not present

## 2022-10-31 DIAGNOSIS — I5032 Chronic diastolic (congestive) heart failure: Secondary | ICD-10-CM | POA: Diagnosis not present

## 2022-10-31 NOTE — Telephone Encounter (Signed)
Please give the patient a call to discuss medications. She is a little confused as to what  she should be taking due to being hospitalized and them changing her meds.

## 2022-11-01 DIAGNOSIS — M6281 Muscle weakness (generalized): Secondary | ICD-10-CM | POA: Diagnosis not present

## 2022-11-01 DIAGNOSIS — R2689 Other abnormalities of gait and mobility: Secondary | ICD-10-CM | POA: Diagnosis not present

## 2022-11-01 NOTE — Telephone Encounter (Signed)
Pt will be bringing all of her medications, she was in the rehab facility connected to [employer]. She wants to ensure she is taking the right medications and correct doses.

## 2022-11-02 ENCOUNTER — Inpatient Hospital Stay: Payer: Medicare PPO | Admitting: Family Medicine

## 2022-11-02 ENCOUNTER — Other Ambulatory Visit: Payer: Self-pay | Admitting: Family Medicine

## 2022-11-02 NOTE — Progress Notes (Deleted)
11/02/2022  CC: No chief complaint on file.   Patient is a 87 y.o. female who presents for  hospital follow up. Dates hospitalized: 10/22/2022 to 10/26/2022 Days since d/c from hospital: 7 days Patient was discharged from hospital to SNF Reason for admission to hospital: Generalized weakness in the setting of recent COVID infection.  She had not been drinking well and continued to take her Lasix and Jardiance. In the ED was found to have acute kidney injury and dehydration.  I have reviewed patient's discharge summary plus pertinent specific notes, labs, and imaging from the hospitalization.    ***{current status of patient, symptoms, etc}  Medication reconciliation was done today and patient {is not} {is} taking meds as recommended by discharging hospitalist/specialist.   Tylenol as needed, albuterol as needed, alprazolam 0.5 mg 3 times daily as needed, amiodarone 200 mg daily, Eliquis 5 mg twice daily, atorvastatin 40 mg daily, Jardiance 10 mg daily, furosemide 20 mg daily, hydralazine 100 mg twice daily, Lopressor 100 mg twice daily, potassium chloride 20 mill equivalents daily.  ROS: ***  PMH:  Past Medical History:  Diagnosis Date   Abnormal EKG approx 2008   Nuclear stress test neg;    Acute pancreatitis 09/2020   idiopathic.  +panc psudocyst   Anxiety    with panic   CAP (community acquired pneumonia) 08/2017   Hospitalization for CAP/acute diast HF/rapid a-fib   Cataract    s/p surgery--lens implants   Chronic diastolic heart failure (HCC) 2019   Chronic renal insufficiency, stage 3 (moderate) (HCC) 12/04/2012   Renal u/s when in hosp 08/2017 for CAP/CHF showed symmetric kidneys, echogenicity normal, w/out hydronephrosis. Baseline GFR around 40 ml/min as of 10/2018.   DDD (degenerative disc disease), lumbar    Diverticulosis 2009   Fracture of radial shaft, left, closed 11/16/2010   fell down flight of stairs   History of kidney stones    Hx of adenomatous colonic  polyps 2002;2009;2015   surveillance colonoscopy 2009, +polypectomy done-tubular adenoma w/out high grade.  05/2013 tubular adenomas--recall 3 yrs   Hyperlipidemia    Hypertension    Low TSH level 02/18/2016   T3 norm, T4 mildly elevated--suspected sick euthyroid syndrome.  Repeat labs 06/2016: normal.   Melanoma in situ (HCC) 06/2018   L LL   Osteoarthritis of both knees    viscosupplementation injections helpful 2020/21   Osteopenia    DEXA 08/2010; repeat DEXA 02/2015 worse: fosamax started.  06/2018 Dexa T score -2.4.  2020 maj osteop fx risk = 24%, Hip fx risk 7.3%. 09/2020 T score -2.1   PAF (paroxysmal atrial fibrillation) (HCC) 02/2016   2018.   DCCV x 2 2024.   Peripheral edema    Pneumonia 2015   hx with sepsis   Rheumatic fever     PSH:  Past Surgical History:  Procedure Laterality Date   APPENDECTOMY  1966   done during surgery for tubal pregnancy   CARDIOVERSION N/A 06/17/2022   Procedure: CARDIOVERSION;  Surgeon: Jodelle Red, MD;  Location[facility] INVASIVE CV LAB;  Service: Cardiovascular;  Laterality: N/A;   CARDIOVERSION N/A 07/11/2022   Procedure: CARDIOVERSION;  Surgeon: Dolores Patty, MD;  Location: [facility] INVASIVE CV LAB;  Service: Cardiovascular;  Laterality: N/A;   CATARACT EXTRACTION W/ INTRAOCULAR LENS IMPLANT  2013   bilat   COLONOSCOPY W/ POLYPECTOMY  05/2013   +diverticulosis; recall 3 yrs (Dr. Marina Goodell)   DEXA  02/2015; 06/2018   T score -2.1 in both femoral necks; FRAX 10  yr risk of major osteoporotic fracture was 21%---fosamax started. 06/2018 T score -2.4.  T score 09/2020 -2.1. Rpt 2 yrs.   ECTOPIC PREGNANCY SURGERY     EYE SURGERY     LEFT HEART CATH AND CORONARY ANGIOGRAPHY N/A 09/01/2017   No angiographically significant CAD.  Upper normal left ventricular filling pressure.  Procedure: LEFT HEART CATH AND CORONARY ANGIOGRAPHY;  Surgeon: Yvonne Kendall, MD;  Location: [facility] INVASIVE CV LAB;  Service: Cardiovascular;  Laterality: N/A;   LUMBAR  LAMINECTOMY/DECOMPRESSION MICRODISCECTOMY N/A 09/26/2018   Procedure: Decompressive Lumbar Laminectomy L5 S1 FORAMINOTOMY L5 S1  NERVE ROOT BILATERALLY and Microdiscectomy L5-S1 Left;  Surgeon: Ranee Gosselin, MD;  Location: [facility] ORS;  Service: Orthopedics;  Laterality: N/A;    OPEN REDUCTION INTERNAL FIXATION (ORIF) TIBIA/FIBULA FRACTURE Left 02/18/2016   Procedure: OPEN REDUCTION INTERNAL FIXATION (ORIF) Right ankle trimalleolar fracture;  Surgeon: Toni Arthurs, MD;  Location: [facility] OR;  Service: Orthopedics;  Laterality: Left;  requests   ORIF RADIAL FRACTURE  11/18/2010   left; s/p slip on slippery floor and fell   THORACENTESIS  08/2017   diagnostic and therapeutic.  Transudative.  Clx neg.  (+pulm edema/diastolic HF)   TONSILLECTOMY     TRANSTHORACIC ECHOCARDIOGRAM  02/18/2016; 08/10/17   LVEF of 55-60%, mild AI and mild MR and normal biatrial size.  07/2017--normal LV function, mild enlarge aortic root, mild/mod TR, bilat atrial enlargement. 06/09/22 EF 50-55%, mod MR, aortic root stable enlgmt    MEDS:  Outpatient Medications Prior to Visit  Medication Sig Dispense Refill   acetaminophen (TYLENOL) 650 MG CR tablet Take 1,300 mg by mouth daily.     albuterol (VENTOLIN HFA) 108 (90 Base) MCG/ACT inhaler INHALE 2 PUFFS BY MOUTH into THE lungs EVERY 4 HOURS AS NEEDED FOR WHEEZING OR SHORTNESS OF BREATH (Patient taking differently: Inhale 2 puffs into the lungs every 4 (four) hours as needed for shortness of breath.) 18 g 1   ALPRAZolam (XANAX) 0.5 MG tablet Take 1 tablet (0.5 mg total) by mouth 3 (three) times daily as needed for anxiety. TAKE 1 TABLET BY MOUTH 3 TIMES DAILY AS NEEDED FOR ANXIETY (Patient taking differently: Take 0.5 mg by mouth 3 (three) times daily as needed for anxiety.) 90 tablet 5   amiodarone (PACERONE) 200 MG tablet Take 1 tablet (200 mg total) by mouth daily. 90 tablet 3   apixaban (ELIQUIS) 5 MG TABS tablet Take 1 tablet (5 mg total) by mouth 2 (two) times  daily. 60 tablet 5   Apoaequorin (PREVAGEN PO) Take 1 tablet by mouth daily.     Ascorbic Acid (VITAMIN C PO) Take by mouth daily.     atorvastatin (LIPITOR) 40 MG tablet TAKE 1 TABLET BY MOUTH EVERY DAY 90 tablet 1   Biotin 5000 MCG TABS Take 5,000 mcg by mouth daily.     Calcium Carbonate (CALCIUM 600 PO) Take 1,200 mg by mouth daily.      carboxymethylcellulose (REFRESH PLUS) 0.5 % SOLN Place 2 drops into both eyes daily as needed (dry/irritated eyes.). (Patient not taking: Reported on 10/23/2022)     Coenzyme Q10 200 MG capsule Take 200 mg by mouth daily.     empagliflozin (JARDIANCE) 10 MG TABS tablet Take 1 tablet (10 mg total) by mouth daily before breakfast. 90 tablet 3   fluticasone (FLONASE) 50 MCG/ACT nasal spray Place 2 sprays into both nostrils daily.      furosemide (LASIX) 20 MG tablet Take 1 tablet (20 mg total) by mouth  daily. 90 tablet 3   hydrALAZINE (APRESOLINE) 100 MG tablet Take 1 tablet (100 mg total) by mouth 2 (two) times daily. (Patient taking differently: Take 100 mg by mouth 3 (three) times daily.) 270 tablet 1   hydrocortisone cream 1 % Apply 1 Application topically as needed for itching.     metoprolol tartrate (LOPRESSOR) 100 MG tablet Take 1 tablet (100 mg total) by mouth 2 (two) times daily. 180 tablet 2   Multiple Vitamins-Minerals (CENTRUM SILVER ULTRA WOMENS) TABS Take 1 tablet by mouth every evening.     potassium chloride SA (KLOR-CON M) 20 MEQ tablet Take 1 tablet (20 mEq total) by mouth daily. 90 tablet 3   No facility-administered medications prior to visit.    Physical Exam    10/26/2022    2:14 PM 10/26/2022    4:28 AM 10/26/2022    3:47 AM  Vitals with BMI  Weight  171 lbs 1 oz   BMI  31.81   Systolic 132  145  Diastolic 67  75  Pulse 62  56   ***   Pertinent labs/imaging Last CBC Lab Results  Component Value Date   WBC 6.3 10/23/2022   HGB 12.5 10/23/2022   HCT 39.9 10/23/2022   MCV 90.9 10/23/2022   MCH 28.5 10/23/2022   RDW 14.3  10/23/2022   PLT 275 10/23/2022   Last metabolic panel Lab Results  Component Value Date   GLUCOSE 121 (H) 10/26/2022   NA 139 10/26/2022   K 3.8 10/26/2022   CL 108 10/26/2022   CO2 22 10/26/2022   BUN 29 (H) 10/26/2022   CREATININE 1.18 (H) 10/26/2022   GFRNONAA 45 (L) 10/26/2022   CALCIUM 9.4 10/26/2022   PHOS 2.3 (L) 09/18/2020   PROT 6.3 (L) 10/26/2022   ALBUMIN 2.7 (L) 10/26/2022   LABGLOB 2.4 09/24/2020   AGRATIO 1.5 09/24/2020   BILITOT 0.6 10/26/2022   ALKPHOS 94 10/26/2022   AST 28 10/26/2022   ALT 43 10/26/2022   ANIONGAP 9 10/26/2022   Last thyroid functions Lab Results  Component Value Date   TSH 0.618 10/23/2022   T3TOTAL 115.0 07/04/2016   ASSESSMENT/PLAN:  ***  {Medical decision making of moderate complexity was utilized today} 99495  {Medical decision making of high complexity was utilized today} 78295  FOLLOW UP:  ***  Signed:  Santiago Bumpers, MD           11/02/2022

## 2022-11-02 NOTE — Telephone Encounter (Signed)
Pt has appt today, 10/16.

## 2022-11-04 ENCOUNTER — Ambulatory Visit (INDEPENDENT_AMBULATORY_CARE_PROVIDER_SITE_OTHER): Payer: Medicare PPO | Admitting: Urgent Care

## 2022-11-04 ENCOUNTER — Ambulatory Visit: Payer: Medicare PPO | Admitting: Family Medicine

## 2022-11-04 VITALS — BP 138/80 | HR 57 | Temp 97.8°F | Wt 170.0 lb

## 2022-11-04 DIAGNOSIS — Z789 Other specified health status: Secondary | ICD-10-CM

## 2022-11-04 DIAGNOSIS — I10 Essential (primary) hypertension: Secondary | ICD-10-CM | POA: Diagnosis not present

## 2022-11-04 DIAGNOSIS — N179 Acute kidney failure, unspecified: Secondary | ICD-10-CM

## 2022-11-04 DIAGNOSIS — E860 Dehydration: Secondary | ICD-10-CM | POA: Diagnosis not present

## 2022-11-04 DIAGNOSIS — R5381 Other malaise: Secondary | ICD-10-CM

## 2022-11-04 DIAGNOSIS — M545 Low back pain, unspecified: Secondary | ICD-10-CM | POA: Diagnosis not present

## 2022-11-04 DIAGNOSIS — Z79899 Other long term (current) drug therapy: Secondary | ICD-10-CM | POA: Diagnosis not present

## 2022-11-04 DIAGNOSIS — N1832 Chronic kidney disease, stage 3b: Secondary | ICD-10-CM | POA: Diagnosis not present

## 2022-11-04 LAB — POCT URINALYSIS DIPSTICK
Bilirubin, UA: NEGATIVE
Blood, UA: NEGATIVE
Glucose, UA: NEGATIVE
Ketones, UA: NEGATIVE
Nitrite, UA: NEGATIVE
Protein, UA: NEGATIVE
Spec Grav, UA: 1.015 (ref 1.010–1.025)
Urobilinogen, UA: 0.2 U/dL
pH, UA: 5.5 (ref 5.0–8.0)

## 2022-11-04 MED ORDER — METHOCARBAMOL 500 MG PO TABS: 500.0000 mg | ORAL_TABLET | Freq: Three times a day (TID) | ORAL | 0 refills | Status: DC | PRN

## 2022-11-04 MED ORDER — TIZANIDINE HCL 2 MG PO TABS: 2.0000 mg | ORAL_TABLET | Freq: Three times a day (TID) | ORAL | 0 refills | Status: DC | PRN

## 2022-11-04 NOTE — Progress Notes (Unsigned)
Established Patient Office Visit  Subjective:  Patient ID: Brenda Matthews, female    DOB: 05-May-1935  Age: 87 y.o. MRN: [id number]  Chief Complaint  Patient presents with   Follow-up    Follow up on meds. Pt was just released from rehab for dehydration. She thinks it may be due to [place].    History of Present Illness The patient, with a history of heart failure, kidney disease, and diabetes, was recently hospitalized due to dehydration. They were concerned about the potential side effects of Jardiance, a medication they started in August, as they had seen on television that it could cause dehydration. However, they were reassured that the medication was beneficial for renal and cardiac protection. The patient had been feeling unwell after recovering from COVID-19, which led to inadequate hydration and nutrition, resulting in dehydration and hospitalization.  During the hospital stay, the patient's Jardiance and Lasix were temporarily stopped and IV fluids were administered. The patient's kidney function improved significantly during the hospitalization, with creatinine levels dropping from 2.51 to 1.18. The patient was discharged after five days and was advised to restart Jardiance and Lasix.  The patient has been experiencing lower back pain since their hospital stay, which they initially attributed to the hospital bed. The pain is located across the lower lumbar region on both sides and is exacerbated by movement. The patient has been managing the pain with Tylenol and Icy Hot patches but is seeking a stronger pain relief option.  The patient also reported a strong odor in their urine since returning home from the hospital. They denied any burning or stinging during urination.  Assessment & Plan Acute Kidney Injury (AKI) Recent hospitalization for AKI likely secondary to dehydration post-COVID. Jardiance was held during hospitalization and restarted after renal function  improved. -Continue Jardiance for cardiovascular and renal protection. -Order CMP today to monitor renal function.  Lower Back Pain New onset lower back pain, likely musculoskeletal in nature. No urinary symptoms but reports a strong odor to urine. -Order urinalysis to rule out urinary tract infection. -If urinalysis is negative, consider starting Methocarbamol for pain relief. -Advise patient to apply heat intermittently to the affected area.  Hypertension Blood pressure mildly elevated but within acceptable range given recent readings. -Continue current antihypertensive regimen.  Amiodarone Dosing Confusion regarding amiodarone dosing clarified. -Continue Amiodarone 200mg  daily as per cardiologist's recommendation.  Physical Therapy Ongoing physical therapy post-hospitalization. -Encourage continuation of physical therapy.  Follow-up -Advise patient to schedule follow-up appointment with Cardiology.    {History (Optional):23778}  ROS: as noted in HPI  Objective:     BP (!) 142/77   Pulse (!) 57   Temp 97.8 F (36.6 C) (Oral)   Wt 170 lb (77.1 kg)   LMP  (LMP Unknown)   SpO2 94%   BMI 31.60 kg/m  BP Readings from Last 3 Encounters:  11/04/22 (!) 142/77  10/26/22 132/67  09/13/22 136/64   Wt Readings from Last 3 Encounters:  11/04/22 170 lb (77.1 kg)  10/26/22 171 lb 1.2 oz (77.6 kg)  09/13/22 176 lb (79.8 kg)      Physical Exam   No results found for any visits on 11/04/22.  {Labs (Optional):23779}  The ASCVD Risk score (Arnett DK, et al., 2019) failed to calculate for the following reasons:   The 2019 ASCVD risk score is only valid for ages 36 to 99  Assessment & Plan:  Transition of patient between care settings  Dehydration -     POCT  urinalysis dipstick -     Comprehensive metabolic panel -     CBC  Acute bilateral low back pain without sciatica -     POCT urinalysis dipstick -     Comprehensive metabolic panel -     CBC  Acute renal  failure superimposed on stage 3b chronic kidney disease, unspecified acute renal failure type (HCC) -     Comprehensive metabolic panel -     CBC     No follow-ups on file.   Maretta Bees, PA

## 2022-11-04 NOTE — Patient Instructions (Addendum)
We rechecked your labs today. Please continue with Physical therapy.  Please use a heating pack to your lower back as needed. You can continue taking tylenol 1000mg  every 8 hours. I have called in a low dose muscle relaxer (tizanidine). Take this AS NEEDED. It can cause you to feel drowsy so take with caution.  Your urine does not indicate infection, but we will send out a culture for confirmation.  Make sure you are only taking one 200mg  tablet of amiodarone per cardiology.  Keep your appointment in Nov as previously scheduled.

## 2022-11-05 LAB — COMPREHENSIVE METABOLIC PANEL
AG Ratio: 1.2 (calc) (ref 1.0–2.5)
ALT: 35 U/L — ABNORMAL HIGH (ref 6–29)
AST: 24 U/L (ref 10–35)
Albumin: 3.7 g/dL (ref 3.6–5.1)
Alkaline phosphatase (APISO): 108 U/L (ref 37–153)
BUN/Creatinine Ratio: 20 (calc) (ref 6–22)
BUN: 31 mg/dL — ABNORMAL HIGH (ref 7–25)
CO2: 23 mmol/L (ref 20–32)
Calcium: 9.4 mg/dL (ref 8.6–10.4)
Chloride: 106 mmol/L (ref 98–110)
Creat: 1.53 mg/dL — ABNORMAL HIGH (ref 0.60–0.95)
Globulin: 3.1 g/dL (ref 1.9–3.7)
Glucose, Bld: 96 mg/dL (ref 65–99)
Potassium: 3.7 mmol/L (ref 3.5–5.3)
Sodium: 141 mmol/L (ref 135–146)
Total Bilirubin: 0.6 mg/dL (ref 0.2–1.2)
Total Protein: 6.8 g/dL (ref 6.1–8.1)

## 2022-11-05 LAB — CBC
HCT: 42.5 % (ref 35.0–45.0)
Hemoglobin: 13.5 g/dL (ref 11.7–15.5)
MCH: 27.6 pg (ref 27.0–33.0)
MCHC: 31.8 g/dL — ABNORMAL LOW (ref 32.0–36.0)
MCV: 86.9 fL (ref 80.0–100.0)
MPV: 8.9 fL (ref 7.5–12.5)
Platelets: 288 10*3/uL (ref 140–400)
RBC: 4.89 10*6/uL (ref 3.80–5.10)
RDW: 14 % (ref 11.0–15.0)
WBC: 7.3 10*3/uL (ref 3.8–10.8)

## 2022-11-06 LAB — URINE CULTURE
MICRO NUMBER:: 15614747
Result:: NO GROWTH
SPECIMEN QUALITY:: ADEQUATE

## 2022-11-07 ENCOUNTER — Encounter: Payer: Self-pay | Admitting: Urgent Care

## 2022-11-07 DIAGNOSIS — M6281 Muscle weakness (generalized): Secondary | ICD-10-CM | POA: Diagnosis not present

## 2022-11-07 DIAGNOSIS — I5032 Chronic diastolic (congestive) heart failure: Secondary | ICD-10-CM | POA: Diagnosis not present

## 2022-11-07 DIAGNOSIS — R2689 Other abnormalities of gait and mobility: Secondary | ICD-10-CM | POA: Diagnosis not present

## 2022-11-07 DIAGNOSIS — Z79899 Other long term (current) drug therapy: Secondary | ICD-10-CM | POA: Insufficient documentation

## 2022-11-07 DIAGNOSIS — R2681 Unsteadiness on feet: Secondary | ICD-10-CM | POA: Diagnosis not present

## 2022-11-07 DIAGNOSIS — M545 Low back pain, unspecified: Secondary | ICD-10-CM | POA: Insufficient documentation

## 2022-11-07 NOTE — Assessment & Plan Note (Signed)
Ongoing physical therapy post-hospitalization. -Encourage continuation of physical therapy.

## 2022-11-07 NOTE — Assessment & Plan Note (Signed)
Blood pressure mildly elevated initially, but within acceptable range given recent readings. -Continue current antihypertensive regimen.

## 2022-11-07 NOTE — Assessment & Plan Note (Signed)
Recent hospitalization for AKI likely secondary to dehydration post-COVID. Jardiance was held during hospitalization and restarted after renal function improved. -Continue Jardiance for cardiovascular and renal protection. -Order CMP today to monitor renal function. -Continue maximizing hydration while staying within fluid restrictions from CHF

## 2022-11-07 NOTE — Assessment & Plan Note (Signed)
Confusion regarding amiodarone dosing clarified. -Continue Amiodarone 200mg  daily as per cardiologist's recommendation (this was noted back in July 2024)

## 2022-11-07 NOTE — Assessment & Plan Note (Signed)
New onset lower back pain (however pt does have chronic spinal stenosis), likely musculoskeletal in nature. No urinary symptoms but reports a strong odor to urine. -UA in office appears negative; send out culture for confirmation. -Continue tylenol 1000mg  every 8 hours for pain relief -Advise patient to apply heat intermittently to the affected area. -If Tylenol and heat therapy alone are not effective, may take very sporadic tizanidine, however side effects of significant drowsiness/ sedation discussed with pt and recommended she take very sparingly.

## 2022-11-08 ENCOUNTER — Other Ambulatory Visit: Payer: Self-pay

## 2022-11-08 DIAGNOSIS — N1832 Chronic kidney disease, stage 3b: Secondary | ICD-10-CM

## 2022-11-09 DIAGNOSIS — R2689 Other abnormalities of gait and mobility: Secondary | ICD-10-CM | POA: Diagnosis not present

## 2022-11-09 DIAGNOSIS — M6281 Muscle weakness (generalized): Secondary | ICD-10-CM | POA: Diagnosis not present

## 2022-11-14 ENCOUNTER — Other Ambulatory Visit: Payer: Medicare PPO

## 2022-11-14 DIAGNOSIS — R2689 Other abnormalities of gait and mobility: Secondary | ICD-10-CM | POA: Diagnosis not present

## 2022-11-14 DIAGNOSIS — M6281 Muscle weakness (generalized): Secondary | ICD-10-CM | POA: Diagnosis not present

## 2022-11-15 ENCOUNTER — Other Ambulatory Visit (HOSPITAL_BASED_OUTPATIENT_CLINIC_OR_DEPARTMENT_OTHER): Payer: Self-pay

## 2022-11-15 ENCOUNTER — Encounter: Payer: Self-pay | Admitting: Internal Medicine

## 2022-11-15 DIAGNOSIS — I5032 Chronic diastolic (congestive) heart failure: Secondary | ICD-10-CM | POA: Diagnosis not present

## 2022-11-15 DIAGNOSIS — R2681 Unsteadiness on feet: Secondary | ICD-10-CM | POA: Diagnosis not present

## 2022-11-15 MED ORDER — EMPAGLIFLOZIN 10 MG PO TABS: 10.0000 mg | ORAL_TABLET | Freq: Every day | ORAL | 3 refills | Status: DC

## 2022-11-15 NOTE — Telephone Encounter (Signed)
Fax received for refill of Jardiance, rx sent to requested pharm

## 2022-11-16 ENCOUNTER — Other Ambulatory Visit: Payer: Self-pay | Admitting: Family

## 2022-11-16 ENCOUNTER — Other Ambulatory Visit: Payer: Medicare PPO

## 2022-11-16 DIAGNOSIS — N1832 Chronic kidney disease, stage 3b: Secondary | ICD-10-CM | POA: Diagnosis not present

## 2022-11-17 DIAGNOSIS — R2681 Unsteadiness on feet: Secondary | ICD-10-CM | POA: Diagnosis not present

## 2022-11-17 DIAGNOSIS — R2689 Other abnormalities of gait and mobility: Secondary | ICD-10-CM | POA: Diagnosis not present

## 2022-11-17 DIAGNOSIS — I5032 Chronic diastolic (congestive) heart failure: Secondary | ICD-10-CM | POA: Diagnosis not present

## 2022-11-17 DIAGNOSIS — M6281 Muscle weakness (generalized): Secondary | ICD-10-CM | POA: Diagnosis not present

## 2022-11-17 LAB — BASIC METABOLIC PANEL
BUN/Creatinine Ratio: 21 (calc) (ref 6–22)
BUN: 32 mg/dL — ABNORMAL HIGH (ref 7–25)
CO2: 24 mmol/L (ref 20–32)
Calcium: 9.2 mg/dL (ref 8.6–10.4)
Chloride: 108 mmol/L (ref 98–110)
Creat: 1.51 mg/dL — ABNORMAL HIGH (ref 0.60–0.95)
Glucose, Bld: 93 mg/dL (ref 65–99)
Potassium: 4.1 mmol/L (ref 3.5–5.3)
Sodium: 142 mmol/L (ref 135–146)

## 2022-11-18 DIAGNOSIS — M6281 Muscle weakness (generalized): Secondary | ICD-10-CM | POA: Diagnosis not present

## 2022-11-18 DIAGNOSIS — R2689 Other abnormalities of gait and mobility: Secondary | ICD-10-CM | POA: Diagnosis not present

## 2022-11-21 ENCOUNTER — Telehealth: Payer: Self-pay | Admitting: Family Medicine

## 2022-11-21 ENCOUNTER — Ambulatory Visit: Payer: Medicare PPO | Admitting: Cardiology

## 2022-11-21 DIAGNOSIS — R2689 Other abnormalities of gait and mobility: Secondary | ICD-10-CM | POA: Diagnosis not present

## 2022-11-21 DIAGNOSIS — M6281 Muscle weakness (generalized): Secondary | ICD-10-CM | POA: Diagnosis not present

## 2022-11-21 NOTE — Telephone Encounter (Signed)
Patient aware of lab results and has no additional questions or concerns.

## 2022-11-23 DIAGNOSIS — R2689 Other abnormalities of gait and mobility: Secondary | ICD-10-CM | POA: Diagnosis not present

## 2022-11-23 DIAGNOSIS — M6281 Muscle weakness (generalized): Secondary | ICD-10-CM | POA: Diagnosis not present

## 2022-11-24 DIAGNOSIS — I5032 Chronic diastolic (congestive) heart failure: Secondary | ICD-10-CM | POA: Diagnosis not present

## 2022-11-24 DIAGNOSIS — R2681 Unsteadiness on feet: Secondary | ICD-10-CM | POA: Diagnosis not present

## 2022-11-28 DIAGNOSIS — R2689 Other abnormalities of gait and mobility: Secondary | ICD-10-CM | POA: Diagnosis not present

## 2022-11-28 DIAGNOSIS — M6281 Muscle weakness (generalized): Secondary | ICD-10-CM | POA: Diagnosis not present

## 2022-11-29 ENCOUNTER — Telehealth: Payer: Self-pay | Admitting: Family Medicine

## 2022-11-29 DIAGNOSIS — R269 Unspecified abnormalities of gait and mobility: Secondary | ICD-10-CM

## 2022-11-29 NOTE — Telephone Encounter (Signed)
Jacki Cones with [employer]. They are requesting an approval for a four wheel roller walker. Jacki Cones can be reached at [phone]

## 2022-11-29 NOTE — Telephone Encounter (Signed)
Okay for orders? 

## 2022-11-30 NOTE — Telephone Encounter (Signed)
Notified facility and faxed approval of order.

## 2022-12-01 ENCOUNTER — Encounter: Payer: Self-pay | Admitting: Family Medicine

## 2022-12-01 NOTE — Telephone Encounter (Signed)
Placed on PCP desk to sign

## 2022-12-01 NOTE — Telephone Encounter (Signed)
Jacki Cones with [employer] stated she received order for patient to have 4-wheel roller walker. Order was not signed by physician or dated.  Please have Dr. Milinda Cave  sign order, make sure date is on order request and please fax to [phone].

## 2022-12-02 ENCOUNTER — Ambulatory Visit: Payer: Medicare PPO | Admitting: Family Medicine

## 2022-12-02 DIAGNOSIS — R2689 Other abnormalities of gait and mobility: Secondary | ICD-10-CM | POA: Diagnosis not present

## 2022-12-02 DIAGNOSIS — M6281 Muscle weakness (generalized): Secondary | ICD-10-CM | POA: Diagnosis not present

## 2022-12-02 NOTE — Progress Notes (Deleted)
OFFICE VISIT  12/02/2022  CC: No chief complaint on file.   Patient is a 87 y.o. female who presents for follow-up hypertension and chronic renal insufficiency stage III. I last saw her on 09/01/2022 at which time her blood pressure had been improving and we maximized her hydralazine to 100 mg 3 times daily.  We continued her on Lopressor 100 mg twice daily.  INTERIM HX: *** Hospitalized 10/5 to 10/26/2022 for dehydration and weakness, acute on chronic renal failure.  Discharged back to her independent living facility. No changes were made in her chronic medications.  Past Medical History:  Diagnosis Date   Abnormal EKG approx 2008   Nuclear stress test neg;    Acute pancreatitis 09/2020   idiopathic.  +panc psudocyst   Anxiety    with panic   CAP (community acquired pneumonia) 08/2017   Hospitalization for CAP/acute diast HF/rapid a-fib   Cataract    s/p surgery--lens implants   Chronic diastolic heart failure (HCC) 2019   Chronic renal insufficiency, stage 3 (moderate) (HCC) 12/04/2012   Renal u/s when in hosp 08/2017 for CAP/CHF showed symmetric kidneys, echogenicity normal, w/out hydronephrosis. Baseline GFR around 40 ml/min as of 10/2018.   DDD (degenerative disc disease), lumbar    Diverticulosis 2009   Fracture of radial shaft, left, closed 11/16/2010   fell down flight of stairs   History of kidney stones    Hx of adenomatous colonic polyps 2002;2009;2015   surveillance colonoscopy 2009, +polypectomy done-tubular adenoma w/out high grade.  05/2013 tubular adenomas--recall 3 yrs   Hyperlipidemia    Hypertension    Low TSH level 02/18/2016   T3 norm, T4 mildly elevated--suspected sick euthyroid syndrome.  Repeat labs 06/2016: normal.   Melanoma in situ (HCC) 06/2018   L LL   Osteoarthritis of both knees    viscosupplementation injections helpful 2020/21   Osteopenia    DEXA 08/2010; repeat DEXA 02/2015 worse: fosamax started.  06/2018 Dexa T score -2.4.  2020 maj osteop fx  risk = 24%, Hip fx risk 7.3%. 09/2020 T score -2.1   PAF (paroxysmal atrial fibrillation) (HCC) 02/2016   2018.   DCCV x 2 2024.   Peripheral edema    Pneumonia 2015   hx with sepsis   Rheumatic fever     Past Surgical History:  Procedure Laterality Date   APPENDECTOMY  1966   done during surgery for tubal pregnancy   CARDIOVERSION N/A 06/17/2022   Procedure: CARDIOVERSION;  Surgeon: Jodelle Red, MD;  Location: [facility] INVASIVE CV LAB;  Service: Cardiovascular;  Laterality: N/A;   CARDIOVERSION N/A 07/11/2022   Procedure: CARDIOVERSION;  Surgeon: Dolores Patty, MD;  Location: [facility] INVASIVE CV LAB;  Service: Cardiovascular;  Laterality: N/A;   CATARACT EXTRACTION W/ INTRAOCULAR LENS IMPLANT  2013   bilat   COLONOSCOPY W/ POLYPECTOMY  05/2013   +diverticulosis; recall 3 yrs (Dr. Marina Goodell)   DEXA  02/2015; 06/2018   T score -2.1 in both femoral necks; FRAX 10 yr risk of major osteoporotic fracture was 21%---fosamax started. 06/2018 T score -2.4.  T score 09/2020 -2.1. Rpt 2 yrs.   ECTOPIC PREGNANCY SURGERY     EYE SURGERY     LEFT HEART CATH AND CORONARY ANGIOGRAPHY N/A 09/01/2017   No angiographically significant CAD.  Upper normal left ventricular filling pressure.  Procedure: LEFT HEART CATH AND CORONARY ANGIOGRAPHY;  Surgeon: Yvonne Kendall, MD;  Location: [facility] INVASIVE CV LAB;  Service: Cardiovascular;  Laterality: N/A;   LUMBAR LAMINECTOMY/DECOMPRESSION MICRODISCECTOMY N/A  09/26/2018   Procedure: Decompressive Lumbar Laminectomy L5 S1 FORAMINOTOMY L5 S1  NERVE ROOT BILATERALLY and Microdiscectomy L5-S1 Left;  Surgeon: Ranee Gosselin, MD;  Location: [facility] ORS;  Service: Orthopedics;  Laterality: N/A;    OPEN REDUCTION INTERNAL FIXATION (ORIF) TIBIA/FIBULA FRACTURE Left 02/18/2016   Procedure: OPEN REDUCTION INTERNAL FIXATION (ORIF) Right ankle trimalleolar fracture;  Surgeon: Toni Arthurs, MD;  Location: [facility] OR;  Service: Orthopedics;  Laterality: Left;  requests   ORIF  RADIAL FRACTURE  11/18/2010   left; s/p slip on slippery floor and fell   THORACENTESIS  08/2017   diagnostic and therapeutic.  Transudative.  Clx neg.  (+pulm edema/diastolic HF)   TONSILLECTOMY     TRANSTHORACIC ECHOCARDIOGRAM  02/18/2016; 08/10/17   LVEF of 55-60%, mild AI and mild MR and normal biatrial size.  07/2017--normal LV function, mild enlarge aortic root, mild/mod TR, bilat atrial enlargement. 06/09/22 EF 50-55%, mod MR, aortic root stable enlgmt    Outpatient Medications Prior to Visit  Medication Sig Dispense Refill   acetaminophen (TYLENOL) 650 MG CR tablet Take 1,300 mg by mouth daily.     albuterol (VENTOLIN HFA) 108 (90 Base) MCG/ACT inhaler INHALE 2 PUFFS BY MOUTH into THE lungs EVERY 4 HOURS AS NEEDED FOR WHEEZING OR SHORTNESS OF BREATH (Patient taking differently: Inhale 2 puffs into the lungs every 4 (four) hours as needed for shortness of breath.) 18 g 1   ALPRAZolam (XANAX) 0.5 MG tablet Take 1 tablet (0.5 mg total) by mouth 3 (three) times daily as needed for anxiety. TAKE 1 TABLET BY MOUTH 3 TIMES DAILY AS NEEDED FOR ANXIETY (Patient taking differently: Take 0.5 mg by mouth 3 (three) times daily as needed for anxiety.) 90 tablet 5   amiodarone (PACERONE) 200 MG tablet Take 1 tablet (200 mg total) by mouth daily. 90 tablet 3   apixaban (ELIQUIS) 5 MG TABS tablet Take 1 tablet (5 mg total) by mouth 2 (two) times daily. 60 tablet 5   Apoaequorin (PREVAGEN PO) Take 1 tablet by mouth daily.     Ascorbic Acid (VITAMIN C PO) Take by mouth daily.     atorvastatin (LIPITOR) 40 MG tablet TAKE 1 TABLET BY MOUTH EVERY DAY 90 tablet 1   Biotin 5000 MCG TABS Take 5,000 mcg by mouth daily.     Calcium Carbonate (CALCIUM 600 PO) Take 1,200 mg by mouth daily.      carboxymethylcellulose (REFRESH PLUS) 0.5 % SOLN Place 2 drops into both eyes daily as needed (dry/irritated eyes.). (Patient not taking: Reported on 10/23/2022)     Coenzyme Q10 200 MG capsule Take 200 mg by mouth daily.      fluticasone (FLONASE) 50 MCG/ACT nasal spray Place 2 sprays into both nostrils daily.      furosemide (LASIX) 20 MG tablet Take 1 tablet (20 mg total) by mouth daily. 90 tablet 3   hydrALAZINE (APRESOLINE) 100 MG tablet Take 1 tablet (100 mg total) by mouth 2 (two) times daily. (Patient taking differently: Take 100 mg by mouth 3 (three) times daily.) 270 tablet 1   hydrocortisone cream 1 % Apply 1 Application topically as needed for itching.     JARDIANCE 10 MG TABS tablet TAKE 1 TABLET BY MOUTH EVERY DAY BEFORE BREAKFAST 90 tablet 3   metoprolol tartrate (LOPRESSOR) 100 MG tablet Take 1 tablet (100 mg total) by mouth 2 (two) times daily. 180 tablet 2   Multiple Vitamins-Minerals (CENTRUM SILVER ULTRA WOMENS) TABS Take 1 tablet by mouth every evening.  potassium chloride SA (KLOR-CON M) 20 MEQ tablet Take 1 tablet (20 mEq total) by mouth daily. 90 tablet 3   tiZANidine (ZANAFLEX) 2 MG tablet Take 1 tablet (2 mg total) by mouth every 8 (eight) hours as needed for muscle spasms. 15 tablet 0   No facility-administered medications prior to visit.    Allergies  Allergen Reactions   Augmentin [Amoxicillin-Pot Clavulanate] Nausea And Vomiting and Other (See Comments)    "projectile vomiting" Has patient had a PCN reaction causing immediate rash, facial/tongue/throat swelling, SOB or lightheadedness with hypotension:No Has patient had a PCN reaction causing severe rash involving mucus membranes or skin necrosis:No Has patient had a PCN reaction that required hospitalization:No Has patient had a PCN reaction occurring within the last 10 years:Yes If all of the above answers are "NO", then may proceed with Cephalosporin use.    Amoxicillin Rash   Clindamycin/Lincomycin Rash    Review of Systems As per HPI  PE:    11/04/2022    1:44 PM 11/04/2022    1:11 PM 10/26/2022    2:14 PM  Vitals with BMI  Weight  170 lbs   BMI  31.6   Systolic 138 142 161  Diastolic 80 77 67  Pulse  57 62      Physical Exam  ***  LABS:  Last CBC Lab Results  Component Value Date   WBC 7.3 11/04/2022   HGB 13.5 11/04/2022   HCT 42.5 11/04/2022   MCV 86.9 11/04/2022   MCH 27.6 11/04/2022   RDW 14.0 11/04/2022   PLT 288 11/04/2022   Last metabolic panel Lab Results  Component Value Date   GLUCOSE 93 11/16/2022   NA 142 11/16/2022   K 4.1 11/16/2022   CL 108 11/16/2022   CO2 24 11/16/2022   BUN 32 (H) 11/16/2022   CREATININE 1.51 (H) 11/16/2022   GFRNONAA 45 (L) 10/26/2022   CALCIUM 9.2 11/16/2022   PHOS 2.3 (L) 09/18/2020   PROT 6.8 11/04/2022   ALBUMIN 2.7 (L) 10/26/2022   LABGLOB 2.4 09/24/2020   AGRATIO 1.5 09/24/2020   BILITOT 0.6 11/04/2022   ALKPHOS 94 10/26/2022   AST 24 11/04/2022   ALT 35 (H) 11/04/2022   ANIONGAP 9 10/26/2022   Last thyroid functions Lab Results  Component Value Date   TSH 0.618 10/23/2022   T3TOTAL 115.0 07/04/2016   Last vitamin D Lab Results  Component Value Date   VD25OH 37 05/25/2010   IMPRESSION AND PLAN:  No problem-specific Assessment & Plan notes found for this encounter.   An After Visit Summary was printed and given to the patient.  FOLLOW UP: No follow-ups on file.  Signed:  Santiago Bumpers, MD           12/02/2022

## 2022-12-05 DIAGNOSIS — R2689 Other abnormalities of gait and mobility: Secondary | ICD-10-CM | POA: Diagnosis not present

## 2022-12-05 DIAGNOSIS — M6281 Muscle weakness (generalized): Secondary | ICD-10-CM | POA: Diagnosis not present

## 2022-12-07 DIAGNOSIS — R2689 Other abnormalities of gait and mobility: Secondary | ICD-10-CM | POA: Diagnosis not present

## 2022-12-07 DIAGNOSIS — M6281 Muscle weakness (generalized): Secondary | ICD-10-CM | POA: Diagnosis not present

## 2022-12-07 NOTE — Progress Notes (Signed)
Cardiology Office Note:  .   Date:  12/07/2022  ID:  Brenda Matthews, DOB 10/06/1935, MRN [id number] PCP: Jeoffrey Massed, MD  [facility] Providers Cardiologist: Chrystie Nose, MD }   History of Present Illness: .   Brenda Matthews is a 87 y.o. female with hx of  essential hypertension, acute on chronic diastolic CHF, and paroxysmal atrial fibrillation CHA2DS2-VASc score 3. She was advised to continue Xarelto. .She underwent cardiac catheterization 8/19 which showed normal coronaries. Was last seen by Edd Fabian, NP on 09/13/2022 and was well compensated concerning CHF, HR  and BP were controlled.  Unfortunately she was admitted on 10/22/2022 through 10/26/2018 for due to acute renal failure superimposed on 3B chronic kidney disease.  She had had COVID for roughly 2 weeks prior to admission with mild respiratory symptoms and she became worse with weakness and severe fatigue.  She fell onto her bottom 2 to 3 days prior to being seen in the ED.  Creatinine was elevated at 2.51 with BUN of 53.  She was treated for acute kidney injury and diuretic was held.  Prior to discharge she was beginning to develop lower extremity edema and Lasix was restarted.  She was to have physical therapy for deconditioning.  Weight should be between 170- 171 pounds.   She comes today with concerns about how she is doing in general.  She has some lower extremity edema left greater than right at baseline but her weight has been maintained.  She is monitoring her salt intake and is very strict on this and fluid intake limiting to 40 ounces a day.  She denies chest pain, dyspnea on exertion, dizziness, palpitations, or fatigue.  ROS: As above otherwise negative.  Studies Reviewed: .   Echocardiogram 06/09/2022   1. Left ventricular ejection fraction, by estimation, is 50 to 55%. The  left ventricle has low normal function. Left ventricular endocardial  border not optimally defined to evaluate regional wall motion.  There is  mild concentric left ventricular  hypertrophy. Left ventricular diastolic parameters are indeterminate.   2. Right ventricular systolic function is normal. The right ventricular  size is normal.   3. Left atrial size was moderately dilated.   4. Right atrial size was mildly dilated.   5. The mitral valve is grossly normal. Mild to moderate mitral valve  regurgitation.   6. The aortic valve is tricuspid. There is mild calcification of the  aortic valve. There is mild thickening of the aortic valve. Aortic valve  regurgitation is mild. Aortic valve sclerosis/calcification is present,  without any evidence of aortic  stenosis.   7. Aortic dilatation noted. There is mild dilatation of the ascending  aorta, measuring 41 mm.   Physical Exam:   VS:  LMP  (LMP Unknown)    Wt Readings from Last 3 Encounters:  11/04/22 170 lb (77.1 kg)  10/26/22 171 lb 1.2 oz (77.6 kg)  09/13/22 176 lb (79.8 kg)    GEN: Well nourished, well developed in no acute distress NECK: No JVD; No carotid bruits CARDIAC: RRR, soft systolic murmur,  no rubs, gallops RESPIRATORY:  Clear to auscultation without rales, wheezing or rhonchi  ABDOMEN: Soft, non-tender, non-distended EXTREMITIES:  No significant bilateral edema; No deformity   ASSESSMENT AND PLAN: .   Chronic diastolic CHF: No evidence of decompensation currently.  Her weight is maintained at 170 pounds.  Continue current medication regimen to include furosemide 20 mg daily, Jardiance, and with salt restriction, and  purposeful exercise to allow for better cardiac output and fluid elimination.  2.  Paroxysmal atrial fibrillation: Currently the patient is in normal sinus rhythm.  Continue amiodarone 200 mg daily, and Xarelto 15 mg daily..  No issues with bleeding heart rate is well-controlled.  3.  Hypertension: Blood pressure is slightly elevated today.  Normally running in the 1 28-1 30 over 60s range.  No changes in her regimen.  Continue  hydralazine 100 mg twice daily.  4.  Hypercholesterolemia: She is on apixaban 40 mg daily.  Labs are normally drawn by PCP.  Most recent labs in Epic dated 01/05/2022 total cholesterol 144, LDL 61, HDL 60.         Signed, Bettey Mare. Liborio Nixon, ANP, AACC

## 2022-12-08 ENCOUNTER — Ambulatory Visit: Payer: Medicare PPO | Admitting: Family Medicine

## 2022-12-08 NOTE — Progress Notes (Deleted)
OFFICE VISIT  12/08/2022  CC: No chief complaint on file.   Patient is a 87 y.o. female who presents for follow-up hypertension, chronic renal insufficiency, Anxiety, chronic systolic and diastolic HF, PAF.  INTERIM HX: ***   PMP AWARE reviewed today: most recent rx for alprazolam was filled 11/10/2022, # 90, rx by me. No red flags.  Past Medical History:  Diagnosis Date   Abnormal EKG approx 2008   Nuclear stress test neg;    Acute pancreatitis 09/2020   idiopathic.  +panc psudocyst   Anxiety    with panic   CAP (community acquired pneumonia) 08/2017   Hospitalization for CAP/acute diast HF/rapid a-fib   Cataract    s/p surgery--lens implants   Chronic diastolic heart failure (HCC) 2019   Chronic renal insufficiency, stage 3 (moderate) (HCC) 12/04/2012   Renal u/s when in hosp 08/2017 for CAP/CHF showed symmetric kidneys, echogenicity normal, w/out hydronephrosis. Baseline GFR around 40 ml/min as of 10/2018.   DDD (degenerative disc disease), lumbar    Diverticulosis 2009   Fracture of radial shaft, left, closed 11/16/2010   fell down flight of stairs   History of kidney stones    Hx of adenomatous colonic polyps 2002;2009;2015   surveillance colonoscopy 2009, +polypectomy done-tubular adenoma w/out high grade.  05/2013 tubular adenomas--recall 3 yrs   Hyperlipidemia    Hypertension    Low TSH level 02/18/2016   T3 norm, T4 mildly elevated--suspected sick euthyroid syndrome.  Repeat labs 06/2016: normal.   Melanoma in situ (HCC) 06/2018   L LL   Osteoarthritis of both knees    viscosupplementation injections helpful 2020/21   Osteopenia    DEXA 08/2010; repeat DEXA 02/2015 worse: fosamax started.  06/2018 Dexa T score -2.4.  2020 maj osteop fx risk = 24%, Hip fx risk 7.3%. 09/2020 T score -2.1   PAF (paroxysmal atrial fibrillation) (HCC) 02/2016   2018.   DCCV x 2 2024.   Peripheral edema    Pneumonia 2015   hx with sepsis   Rheumatic fever     Past Surgical  History:  Procedure Laterality Date   APPENDECTOMY  1966   done during surgery for tubal pregnancy   CARDIOVERSION N/A 06/17/2022   Procedure: CARDIOVERSION;  Surgeon: Jodelle Red, MD;  Location: [facility] INVASIVE CV LAB;  Service: Cardiovascular;  Laterality: N/A;   CARDIOVERSION N/A 07/11/2022   Procedure: CARDIOVERSION;  Surgeon: Dolores Patty, MD;  Location: [facility] INVASIVE CV LAB;  Service: Cardiovascular;  Laterality: N/A;   CATARACT EXTRACTION W/ INTRAOCULAR LENS IMPLANT  2013   bilat   COLONOSCOPY W/ POLYPECTOMY  05/2013   +diverticulosis; recall 3 yrs (Dr. Marina Goodell)   DEXA  02/2015; 06/2018   T score -2.1 in both femoral necks; FRAX 10 yr risk of major osteoporotic fracture was 21%---fosamax started. 06/2018 T score -2.4.  T score 09/2020 -2.1. Rpt 2 yrs.   ECTOPIC PREGNANCY SURGERY     EYE SURGERY     LEFT HEART CATH AND CORONARY ANGIOGRAPHY N/A 09/01/2017   No angiographically significant CAD.  Upper normal left ventricular filling pressure.  Procedure: LEFT HEART CATH AND CORONARY ANGIOGRAPHY;  Surgeon: Yvonne Kendall, MD;  Location: [facility] INVASIVE CV LAB;  Service: Cardiovascular;  Laterality: N/A;   LUMBAR LAMINECTOMY/DECOMPRESSION MICRODISCECTOMY N/A 09/26/2018   Procedure: Decompressive Lumbar Laminectomy L5 S1 FORAMINOTOMY L5 S1  NERVE ROOT BILATERALLY and Microdiscectomy L5-S1 Left;  Surgeon: Ranee Gosselin, MD;  Location: [facility] ORS;  Service: Orthopedics;  Laterality: N/A;   OPEN REDUCTION INTERNAL FIXATION (ORIF) TIBIA/FIBULA FRACTURE Left 02/18/2016   Procedure: OPEN REDUCTION INTERNAL FIXATION (ORIF) Right ankle trimalleolar fracture;  Surgeon: Toni Arthurs, MD;  Location: [facility] OR;  Service: Orthopedics;  Laterality: Left;  requests   ORIF RADIAL FRACTURE  11/18/2010   left; s/p slip on slippery floor and fell   THORACENTESIS  08/2017   diagnostic and therapeutic.  Transudative.  Clx neg.  (+pulm edema/diastolic HF)   TONSILLECTOMY     TRANSTHORACIC  ECHOCARDIOGRAM  02/18/2016; 08/10/17   LVEF of 55-60%, mild AI and mild MR and normal biatrial size.  07/2017--normal LV function, mild enlarge aortic root, mild/mod TR, bilat atrial enlargement. 06/09/22 EF 50-55%, mod MR, aortic root stable enlgmt    Outpatient Medications Prior to Visit  Medication Sig Dispense Refill   acetaminophen (TYLENOL) 650 MG CR tablet Take 1,300 mg by mouth daily.     albuterol (VENTOLIN HFA) 108 (90 Base) MCG/ACT inhaler INHALE 2 PUFFS BY MOUTH into THE lungs EVERY 4 HOURS AS NEEDED FOR WHEEZING OR SHORTNESS OF BREATH (Patient taking differently: Inhale 2 puffs into the lungs every 4 (four) hours as needed for shortness of breath.) 18 g 1   ALPRAZolam (XANAX) 0.5 MG tablet Take 1 tablet (0.5 mg total) by mouth 3 (three) times daily as needed for anxiety. TAKE 1 TABLET BY MOUTH 3 TIMES DAILY AS NEEDED FOR ANXIETY (Patient taking differently: Take 0.5 mg by mouth 3 (three) times daily as needed for anxiety.) 90 tablet 5   amiodarone (PACERONE) 200 MG tablet Take 1 tablet (200 mg total) by mouth daily. 90 tablet 3   apixaban (ELIQUIS) 5 MG TABS tablet Take 1 tablet (5 mg total) by mouth 2 (two) times daily. 60 tablet 5   Apoaequorin (PREVAGEN PO) Take 1 tablet by mouth daily.     Ascorbic Acid (VITAMIN C PO) Take by mouth daily.     atorvastatin (LIPITOR) 40 MG tablet TAKE 1 TABLET BY MOUTH EVERY DAY 90 tablet 1   Biotin 5000 MCG TABS Take 5,000 mcg by mouth daily.     Calcium Carbonate (CALCIUM 600 PO) Take 1,200 mg by mouth daily.      carboxymethylcellulose (REFRESH PLUS) 0.5 % SOLN Place 2 drops into both eyes daily as needed (dry/irritated eyes.). (Patient not taking: Reported on 10/23/2022)     Coenzyme Q10 200 MG capsule Take 200 mg by mouth daily.     fluticasone (FLONASE) 50 MCG/ACT nasal spray Place 2 sprays into both nostrils daily.      furosemide (LASIX) 20 MG tablet Take 1 tablet (20 mg total) by mouth daily. 90 tablet 3   hydrALAZINE (APRESOLINE) 100 MG  tablet Take 1 tablet (100 mg total) by mouth 2 (two) times daily. (Patient taking differently: Take 100 mg by mouth 3 (three) times daily.) 270 tablet 1   hydrocortisone cream 1 % Apply 1 Application topically as needed for itching.     JARDIANCE 10 MG TABS tablet TAKE 1 TABLET BY MOUTH EVERY DAY BEFORE BREAKFAST 90 tablet 3   metoprolol tartrate (LOPRESSOR) 100 MG tablet Take 1 tablet (100 mg total) by mouth 2 (two) times daily. 180 tablet 2   Multiple Vitamins-Minerals (CENTRUM SILVER ULTRA WOMENS) TABS Take 1 tablet by mouth every evening.     potassium chloride SA (KLOR-CON M) 20 MEQ tablet Take 1 tablet (20 mEq total) by mouth daily. 90 tablet 3   tiZANidine (ZANAFLEX) 2 MG tablet Take 1 tablet (2 mg total) by mouth  every 8 (eight) hours as needed for muscle spasms. 15 tablet 0   No facility-administered medications prior to visit.    Allergies  Allergen Reactions   Augmentin [Amoxicillin-Pot Clavulanate] Nausea And Vomiting and Other (See Comments)    "projectile vomiting" Has patient had a PCN reaction causing immediate rash, facial/tongue/throat swelling, SOB or lightheadedness with hypotension:No Has patient had a PCN reaction causing severe rash involving mucus membranes or skin necrosis:No Has patient had a PCN reaction that required hospitalization:No Has patient had a PCN reaction occurring within the last 10 years:Yes If all of the above answers are "NO", then may proceed with Cephalosporin use.    Amoxicillin Rash   Clindamycin/Lincomycin Rash    Review of Systems As per HPI  PE:    11/04/2022    1:44 PM 11/04/2022    1:11 PM 10/26/2022    2:14 PM  Vitals with BMI  Weight  170 lbs   BMI  31.6   Systolic 138 142 161  Diastolic 80 77 67  Pulse  57 62     Physical Exam  ***  LABS:  Last CBC Lab Results  Component Value Date   WBC 7.3 11/04/2022   HGB 13.5 11/04/2022   HCT 42.5 11/04/2022   MCV 86.9 11/04/2022   MCH 27.6 11/04/2022   RDW 14.0  11/04/2022   PLT 288 11/04/2022   Last metabolic panel Lab Results  Component Value Date   GLUCOSE 93 11/16/2022   NA 142 11/16/2022   K 4.1 11/16/2022   CL 108 11/16/2022   CO2 24 11/16/2022   BUN 32 (H) 11/16/2022   CREATININE 1.51 (H) 11/16/2022   GFRNONAA 45 (L) 10/26/2022   CALCIUM 9.2 11/16/2022   PHOS 2.3 (L) 09/18/2020   PROT 6.8 11/04/2022   ALBUMIN 2.7 (L) 10/26/2022   LABGLOB 2.4 09/24/2020   AGRATIO 1.5 09/24/2020   BILITOT 0.6 11/04/2022   ALKPHOS 94 10/26/2022   AST 24 11/04/2022   ALT 35 (H) 11/04/2022   ANIONGAP 9 10/26/2022   Last lipids Lab Results  Component Value Date   CHOL 144 01/05/2022   HDL 60.90 01/05/2022   LDLCALC 61 01/05/2022   LDLDIRECT 66.0 09/10/2018   TRIG 113.0 01/05/2022   CHOLHDL 2 01/05/2022   Last thyroid functions Lab Results  Component Value Date   TSH 0.618 10/23/2022   T3TOTAL 115.0 07/04/2016   IMPRESSION AND PLAN:  No problem-specific Assessment & Plan notes found for this encounter.  ?bmet  An After Visit Summary was printed and given to the patient.  FOLLOW UP: No follow-ups on file.  Signed:  Santiago Bumpers, MD           12/08/2022

## 2022-12-09 ENCOUNTER — Encounter: Payer: Self-pay | Admitting: Adult Health

## 2022-12-09 ENCOUNTER — Ambulatory Visit: Payer: Medicare PPO | Attending: Adult Health | Admitting: Adult Health

## 2022-12-09 VITALS — BP 144/94 | HR 60 | Ht 61.5 in | Wt 169.8 lb

## 2022-12-09 DIAGNOSIS — I10 Essential (primary) hypertension: Secondary | ICD-10-CM

## 2022-12-09 DIAGNOSIS — I480 Paroxysmal atrial fibrillation: Secondary | ICD-10-CM | POA: Diagnosis not present

## 2022-12-09 DIAGNOSIS — R2689 Other abnormalities of gait and mobility: Secondary | ICD-10-CM | POA: Diagnosis not present

## 2022-12-09 DIAGNOSIS — E7800 Pure hypercholesterolemia, unspecified: Secondary | ICD-10-CM

## 2022-12-09 DIAGNOSIS — I5032 Chronic diastolic (congestive) heart failure: Secondary | ICD-10-CM

## 2022-12-09 DIAGNOSIS — M6281 Muscle weakness (generalized): Secondary | ICD-10-CM | POA: Diagnosis not present

## 2022-12-09 NOTE — Patient Instructions (Signed)
Medication Instructions:  No changes *If you need a refill on your cardiac medications before your next appointment, please call your pharmacy*   Lab Work: No labs If you have labs (blood work) drawn today and your tests are completely normal, you will receive your results only by: MyChart Message (if you have MyChart) OR A paper copy in the mail If you have any lab test that is abnormal or we need to change your treatment, we will call you to review the results.   Testing/Procedures: No Testing   Follow-Up: At [facility], you and your health needs are our priority.  As part of our continuing mission to provide you with exceptional heart care, we have created designated Provider Care Teams.  These Care Teams include your primary Cardiologist (physician) and Advanced Practice Providers (APPs -  Physician Assistants and Nurse Practitioners) who all work together to provide you with the care you need, when you need it.  We recommend signing up for the patient portal called "MyChart".  Sign up information is provided on this After Visit Summary.  MyChart is used to connect with patients for Virtual Visits (Telemedicine).  Patients are able to view lab/test results, encounter notes, upcoming appointments, etc.  Non-urgent messages can be sent to your provider as well.   To learn more about what you can do with MyChart, go to [email or web address].    Your next appointment:   6 month(s)  Provider:   Chrystie Nose, MD

## 2022-12-12 ENCOUNTER — Other Ambulatory Visit: Payer: Self-pay | Admitting: Family Medicine

## 2022-12-12 DIAGNOSIS — M6281 Muscle weakness (generalized): Secondary | ICD-10-CM | POA: Diagnosis not present

## 2022-12-12 DIAGNOSIS — R2689 Other abnormalities of gait and mobility: Secondary | ICD-10-CM | POA: Diagnosis not present

## 2022-12-13 DIAGNOSIS — I5032 Chronic diastolic (congestive) heart failure: Secondary | ICD-10-CM | POA: Diagnosis not present

## 2022-12-13 DIAGNOSIS — R2681 Unsteadiness on feet: Secondary | ICD-10-CM | POA: Diagnosis not present

## 2022-12-14 ENCOUNTER — Telehealth: Payer: Self-pay | Admitting: Internal Medicine

## 2022-12-14 MED ORDER — HYDROCORTISONE 2 % EX CREA: 1.0000 | TOPICAL_CREAM | CUTANEOUS | 1 refills | Status: DC | PRN

## 2022-12-14 NOTE — Telephone Encounter (Signed)
Pharmacy calling to clarify if it needs to be 0.2% or 2.5% for the hydrocortisone cream. Please advise

## 2022-12-14 NOTE — Telephone Encounter (Signed)
Called and spoke to patient who stated she started to notice edema in her feet and legs. She first noticed the swelling late Monday into Tuesday. She also report a rash that she developed to her feet and legs which she thinks may be from something she ate. She is using Aquaphor for the rash which is now resolving. Patient also have concerns about bumps under her skin which was mentioned at last visit. She states she was told they were varicose veins but she has never had issues with them before. Patient deny any other symptoms at this time. She also deny weight gain. Advised patient to take her Lasix, elevate her feet and avoid salty foods. Patient verbalized understanding and agree. Will call back as needed with questions or concerns.

## 2022-12-14 NOTE — Telephone Encounter (Signed)
Pt c/o medication issue:  1. Name of Medication: Hydrocortisone Cream  2. How are you currently taking this medication (dosage and times per day)? Not currently using  3. Are you having a reaction (difficulty breathing--STAT)? No   4. What is your medication issue? Patient states her pharmacy only has a small tube of the 1% that she was advised would not last her long. She reports they have a bigger tube, but it is 2% and would require a prescription. The pharmacies delivery service ends at 6:00 pm today, so she is requesting a callback ASAP. Please advise.

## 2022-12-14 NOTE — Telephone Encounter (Signed)
Spoke to patient who stated the pharmacy only had a small tube of HC 1% cream available. They did have the HC 2% in a larger tube. Patient want prescription for [facility] 2% sent to [facility] so they can deliver. Obtained verbal order from Joni Reining, NP for [facility] 2% and was sent the pharmacy.

## 2022-12-14 NOTE — Telephone Encounter (Signed)
Called patient and relayed message below. Patient requested an appointment to be seen for the bumps under her skin. Appt scheduled for 12/5 with Joni Reining at 2:45 pm   Agree. May also use hydrocortisone 1% cream for the rash.  KL

## 2022-12-14 NOTE — Telephone Encounter (Signed)
Pt c/o swelling/edema: STAT if pt has developed SOB within 24 hours  If swelling, where is the swelling located? Legs  How much weight have you gained and in what time span? 2 pds since Sunday   Have you gained 2 pounds in a day or 5 pounds in a week? yes  Do you have a log of your daily weights (if so, list)? yes  Are you currently taking a fluid pill? yes  Are you currently SOB? Sob   Have you traveled recently in a car or plane for an extended period of time? no

## 2022-12-19 ENCOUNTER — Other Ambulatory Visit: Payer: Self-pay | Admitting: Adult Health

## 2022-12-19 ENCOUNTER — Telehealth: Payer: Self-pay | Admitting: Internal Medicine

## 2022-12-19 NOTE — Telephone Encounter (Signed)
Called pharmacy and gave the okay to switch HC cream to 0.2%

## 2022-12-19 NOTE — Telephone Encounter (Signed)
Pharmacy is calling still needing clarification for hydrocortisone cream. A call was placed by the pharmacy for this 11/27 with no callback to clarify. She reports they have 0.2% or 2.5% for the cream, but not 2% which the prescription was sent for.   Please advise.

## 2022-12-19 NOTE — Telephone Encounter (Signed)
Please review. Thanks!  

## 2022-12-20 ENCOUNTER — Other Ambulatory Visit: Payer: Self-pay | Admitting: Internal Medicine

## 2022-12-20 ENCOUNTER — Encounter: Payer: Self-pay | Admitting: Family Medicine

## 2022-12-20 DIAGNOSIS — I5032 Chronic diastolic (congestive) heart failure: Secondary | ICD-10-CM | POA: Diagnosis not present

## 2022-12-20 DIAGNOSIS — R2681 Unsteadiness on feet: Secondary | ICD-10-CM | POA: Diagnosis not present

## 2022-12-20 NOTE — Progress Notes (Unsigned)
  Cardiology Office Note:  .   Date:  12/20/2022  ID:  Brenda Matthews, DOB [birth year], MRN [id number] PCP: Jeoffrey Massed, MD   [facility] Providers Cardiologist: Chrystie Nose, MD { }   History of Present Illness: .   Brenda Matthews is a 87 y.o. female with hx of  essential hypertension, acute on chronic diastolic CHF, and paroxysmal atrial fibrillation CHA2DS2-VASc score 3. She was advised to continue Xarelto. .She underwent cardiac catheterization 8/19 which showed normal coronaries. Admitted on 10/22/2022 through 10/26/2018 for due to acute renal failure superimposed on 3B chronic kidney disease.   She was last seen on 12/09/2022 but called our office on 12/14/2022 with complaints of lower extremity edema and requested an appointment.  She also complained of having a rash.  She was advised to take her Lasix as directed and avoid salty foods.  She was ordered 2% hydrocortisone cream for rash but should follow-up with PCP.  ROS: ***  Studies Reviewed: .   Echocardiogram 06/09/2022    1. Left ventricular ejection fraction, by estimation, is 50 to 55%. The  left ventricle has low normal function. Left ventricular endocardial  border not optimally defined to evaluate regional wall motion. There is  mild concentric left ventricular  hypertrophy. Left ventricular diastolic parameters are indeterminate.   2. Right ventricular systolic function is normal. The right ventricular  size is normal.   3. Left atrial size was moderately dilated.   4. Right atrial size was mildly dilated.   5. The mitral valve is grossly normal. Mild to moderate mitral valve  regurgitation.   6. The aortic valve is tricuspid. There is mild calcification of the  aortic valve. There is mild thickening of the aortic valve. Aortic valve  regurgitation is mild. Aortic valve sclerosis/calcification is present,  without any evidence of aortic  stenosis.   7. Aortic dilatation noted. There is mild dilatation of the  ascending  aorta, measuring 41 mm.    *** EKG Interpretation Date/Time:    Ventricular Rate:    PR Interval:    QRS Duration:    QT Interval:    QTC Calculation:   R Axis:      Text Interpretation:      Physical Exam:   VS:  LMP  (LMP Unknown)    Wt Readings from Last 3 Encounters:  12/09/22 169 lb 12.8 oz (77 kg)  11/04/22 170 lb (77.1 kg)  10/26/22 171 lb 1.2 oz (77.6 kg)    GEN: Well nourished, well developed in no acute distress NECK: No JVD; No carotid bruits CARDIAC: ***RRR, no murmurs, rubs, gallops RESPIRATORY:  Clear to auscultation without rales, wheezing or rhonchi  ABDOMEN: Soft, non-tender, non-distended EXTREMITIES:  No edema; No deformity   ASSESSMENT AND PLAN: .   ***    {Are you ordering a CV Procedure (e.g. stress test, cath, DCCV, TEE, etc)?   Press F2        :[id number]}    Signed, Brenda Matthews. Brenda Matthews, ANP, AACC

## 2022-12-21 DIAGNOSIS — M6281 Muscle weakness (generalized): Secondary | ICD-10-CM | POA: Diagnosis not present

## 2022-12-21 DIAGNOSIS — R2689 Other abnormalities of gait and mobility: Secondary | ICD-10-CM | POA: Diagnosis not present

## 2022-12-22 ENCOUNTER — Ambulatory Visit: Payer: Medicare PPO | Admitting: Family Medicine

## 2022-12-22 ENCOUNTER — Ambulatory Visit: Payer: Medicare PPO | Admitting: Adult Health

## 2022-12-22 ENCOUNTER — Encounter: Payer: Self-pay | Admitting: Family Medicine

## 2022-12-22 VITALS — BP 192/77 | HR 62 | Wt 168.8 lb

## 2022-12-22 DIAGNOSIS — L52 Erythema nodosum: Secondary | ICD-10-CM

## 2022-12-22 DIAGNOSIS — I10 Essential (primary) hypertension: Secondary | ICD-10-CM | POA: Diagnosis not present

## 2022-12-22 DIAGNOSIS — E7800 Pure hypercholesterolemia, unspecified: Secondary | ICD-10-CM

## 2022-12-22 DIAGNOSIS — N1831 Chronic kidney disease, stage 3a: Secondary | ICD-10-CM | POA: Diagnosis not present

## 2022-12-22 DIAGNOSIS — F411 Generalized anxiety disorder: Secondary | ICD-10-CM | POA: Diagnosis not present

## 2022-12-22 DIAGNOSIS — Z23 Encounter for immunization: Secondary | ICD-10-CM | POA: Diagnosis not present

## 2022-12-22 DIAGNOSIS — R229 Localized swelling, mass and lump, unspecified: Secondary | ICD-10-CM

## 2022-12-22 MED ORDER — DOXYCYCLINE HYCLATE 100 MG PO CAPS: 100.0000 mg | ORAL_CAPSULE | Freq: Two times a day (BID) | ORAL | 0 refills | Status: AC

## 2022-12-22 NOTE — Progress Notes (Signed)
OFFICE VISIT  12/22/2022  CC:  Chief Complaint  Patient presents with   Medical Management of Chronic Issues    Pt mentions having some kind of allergic reaction on her feet and legs; started the Friday before thanksgiving. Pt states by Monday it was worse.  Pt c/o of pain and hot to the touch. Found no relief with hydrocortisone cream.     Patient is a 87 y.o. female who presents for follow-up hypertension, GAD, chronic renal insufficiency stage III.  INTERIM HX: 1 week ago she noted some red nodules on the front of her lower legs.  She accumulated more over the next few days and there is redness around them.  They hurt a little bit and itch a little bit. She has chronic lower extremity edema that is pretty much unchanged from her baseline but possibly a little worse because she has not been wearing her stockings lately. No fever, no malaise, no myalgias or arthralgias.  No chest pain, no shortness of breath.  Chronic anxiety is stable.  She is careful to drink around 40 ounces of fluids per day and she tries to minimize sodium.  PMP AWARE reviewed today: most recent rx for alprazolam was filled 12/12/2022, # 90, rx by me. No red flags.  ROS as above, plus-->no dizziness, no HA. No n/v/d or abd pain.  No palpitations.    Past Medical History:  Diagnosis Date   Abnormal EKG approx 2008   Nuclear stress test neg;    Acute pancreatitis 09/2020   idiopathic.  +panc psudocyst   Anxiety    with panic   CAP (community acquired pneumonia) 08/2017   Hospitalization for CAP/acute diast HF/rapid a-fib   Cataract    s/p surgery--lens implants   Chronic diastolic heart failure (HCC) 2019   Chronic renal insufficiency, stage 3 (moderate) (HCC) 12/04/2012   Renal u/s when in hosp 08/2017 for CAP/CHF showed symmetric kidneys, echogenicity normal, w/out hydronephrosis. Baseline GFR around 40 ml/min as of 10/2018.   DDD (degenerative disc disease), lumbar    Diverticulosis 2009   Fracture  of radial shaft, left, closed 11/16/2010   fell down flight of stairs   History of kidney stones    Hx of adenomatous colonic polyps 2002;2009;2015   surveillance colonoscopy 2009, +polypectomy done-tubular adenoma w/out high grade.  05/2013 tubular adenomas--recall 3 yrs   Hyperlipidemia    Hypertension    Low TSH level 02/18/2016   T3 norm, T4 mildly elevated--suspected sick euthyroid syndrome.  Repeat labs 06/2016: normal.   Melanoma in situ (HCC) 06/2018   L LL   Osteoarthritis of both knees    viscosupplementation injections helpful 2020/21   Osteopenia    DEXA 08/2010; repeat DEXA 02/2015 worse: fosamax started.  06/2018 Dexa T score -2.4.  2020 maj osteop fx risk = 24%, Hip fx risk 7.3%. 09/2020 T score -2.1   PAF (paroxysmal atrial fibrillation) (HCC) 02/2016   2018.   DCCV x 2 2024.   Peripheral edema    Pneumonia 2015   hx with sepsis   Rheumatic fever     Past Surgical History:  Procedure Laterality Date   APPENDECTOMY  1966   done during surgery for tubal pregnancy   CARDIOVERSION N/A 06/17/2022   Procedure: CARDIOVERSION;  Surgeon: Jodelle Red, MD;  Location: [facility] INVASIVE CV LAB;  Service: Cardiovascular;  Laterality: N/A;   CARDIOVERSION N/A 07/11/2022   Procedure: CARDIOVERSION;  Surgeon: Dolores Patty, MD;  Location: [facility] INVASIVE CV LAB;  Service:  Cardiovascular;  Laterality: N/A;   CATARACT EXTRACTION W/ INTRAOCULAR LENS IMPLANT  2013   bilat   COLONOSCOPY W/ POLYPECTOMY  05/2013   +diverticulosis; recall 3 yrs (Dr. Marina Goodell)   DEXA  02/2015; 06/2018   T score -2.1 in both femoral necks; FRAX 10 yr risk of major osteoporotic fracture was 21%---fosamax started. 06/2018 T score -2.4.  T score 09/2020 -2.1. Rpt 2 yrs.   ECTOPIC PREGNANCY SURGERY     EYE SURGERY     LEFT HEART CATH AND CORONARY ANGIOGRAPHY N/A 09/01/2017   No angiographically significant CAD.  Upper normal left ventricular filling pressure.  Procedure: LEFT HEART CATH AND CORONARY ANGIOGRAPHY;   Surgeon: Yvonne Kendall, MD;  Location: [facility] INVASIVE CV LAB;  Service: Cardiovascular;  Laterality: N/A;   LUMBAR LAMINECTOMY/DECOMPRESSION MICRODISCECTOMY N/A 09/26/2018   Procedure: Decompressive Lumbar Laminectomy L5 S1 FORAMINOTOMY L5 S1  NERVE ROOT BILATERALLY and Microdiscectomy L5-S1 Left;  Surgeon: Ranee Gosselin, MD;  Location: [facility] ORS;  Service: Orthopedics;  Laterality: N/A;    OPEN REDUCTION INTERNAL FIXATION (ORIF) TIBIA/FIBULA FRACTURE Left 02/18/2016   Procedure: OPEN REDUCTION INTERNAL FIXATION (ORIF) Right ankle trimalleolar fracture;  Surgeon: Toni Arthurs, MD;  Location: [facility] OR;  Service: Orthopedics;  Laterality: Left;  requests   ORIF RADIAL FRACTURE  11/18/2010   left; s/p slip on slippery floor and fell   THORACENTESIS  08/2017   diagnostic and therapeutic.  Transudative.  Clx neg.  (+pulm edema/diastolic HF)   TONSILLECTOMY     TRANSTHORACIC ECHOCARDIOGRAM  02/18/2016; 08/10/17   LVEF of 55-60%, mild AI and mild MR and normal biatrial size.  07/2017--normal LV function, mild enlarge aortic root, mild/mod TR, bilat atrial enlargement. 06/09/22 EF 50-55%, mod MR, aortic root stable enlgmt    Outpatient Medications Prior to Visit  Medication Sig Dispense Refill   acetaminophen (TYLENOL) 650 MG CR tablet Take 1,300 mg by mouth daily.     albuterol (VENTOLIN HFA) 108 (90 Base) MCG/ACT inhaler INHALE 2 PUFFS BY MOUTH into THE lungs EVERY 4 HOURS AS NEEDED FOR WHEEZING OR SHORTNESS OF BREATH (Patient taking differently: Inhale 2 puffs into the lungs every 4 (four) hours as needed for shortness of breath.) 18 g 1   ALPRAZolam (XANAX) 0.5 MG tablet Take 1 tablet (0.5 mg total) by mouth 3 (three) times daily as needed for anxiety. TAKE 1 TABLET BY MOUTH 3 TIMES DAILY AS NEEDED FOR ANXIETY (Patient taking differently: Take 0.5 mg by mouth 3 (three) times daily as needed for anxiety.) 90 tablet 5   amiodarone (PACERONE) 200 MG tablet Take 1 tablet (200 mg total) by mouth  daily. 90 tablet 3   apixaban (ELIQUIS) 5 MG TABS tablet Take 1 tablet (5 mg total) by mouth 2 (two) times daily. 60 tablet 5   Apoaequorin (PREVAGEN PO) Take 1 tablet by mouth daily.     Ascorbic Acid (VITAMIN C PO) Take by mouth daily.     atorvastatin (LIPITOR) 40 MG tablet TAKE 1 TABLET BY MOUTH EVERY DAY 90 tablet 1   Biotin 5000 MCG TABS Take 5,000 mcg by mouth daily.     Calcium Carbonate (CALCIUM 600 PO) Take 1,200 mg by mouth daily.      carboxymethylcellulose (REFRESH PLUS) 0.5 % SOLN Place 2 drops into both eyes daily as needed (dry/irritated eyes.).     Coenzyme Q10 200 MG capsule Take 200 mg by mouth daily.     fluticasone (FLONASE) 50 MCG/ACT nasal spray Place 2 sprays into both nostrils  daily.      furosemide (LASIX) 20 MG tablet Take 1 tablet (20 mg total) by mouth daily. 90 tablet 3   hydrALAZINE (APRESOLINE) 100 MG tablet Take 1 tablet (100 mg total) by mouth 2 (two) times daily. (Patient taking differently: Take 100 mg by mouth 3 (three) times daily.) 270 tablet 1   Hydrocortisone 2 % CREA Apply 1 applicator topically as needed (for rash on feet and legs). 30 g 1   hydrocortisone cream 1 % Apply 1 Application topically as needed for itching.     JARDIANCE 10 MG TABS tablet TAKE 1 TABLET BY MOUTH EVERY DAY BEFORE BREAKFAST 90 tablet 3   metoprolol tartrate (LOPRESSOR) 100 MG tablet Take 1 tablet (100 mg total) by mouth 2 (two) times daily. 180 tablet 2   Multiple Vitamins-Minerals (CENTRUM SILVER ULTRA WOMENS) TABS Take 1 tablet by mouth every evening.     potassium chloride SA (KLOR-CON M) 20 MEQ tablet Take 1 tablet (20 mEq total) by mouth daily. 90 tablet 3   tiZANidine (ZANAFLEX) 2 MG tablet Take 1 tablet (2 mg total) by mouth every 8 (eight) hours as needed for muscle spasms. 15 tablet 0   XARELTO 15 MG TABS tablet Take 15 mg by mouth daily.     No facility-administered medications prior to visit.    Allergies  Allergen Reactions   Augmentin [Amoxicillin-Pot  Clavulanate] Nausea And Vomiting and Other (See Comments)    "projectile vomiting" Has patient had a PCN reaction causing immediate rash, facial/tongue/throat swelling, SOB or lightheadedness with hypotension:No Has patient had a PCN reaction causing severe rash involving mucus membranes or skin necrosis:No Has patient had a PCN reaction that required hospitalization:No Has patient had a PCN reaction occurring within the last 10 years:Yes If all of the above answers are "NO", then may proceed with Cephalosporin use.    Amoxicillin Rash   Clindamycin/Lincomycin Rash    Review of Systems As per HPI  PE:    12/22/2022    2:10 PM 12/09/2022    3:21 PM 11/04/2022    1:44 PM  Vitals with BMI  Height  5' 1.5"   Weight 168 lbs 13 oz 169 lbs 13 oz   BMI 31.38 31.57   Systolic 192 144 440  Diastolic 77 94 80  Pulse 62 60      Physical Exam  Normal: Alert and well-appearing. Cardiovascular: Regular rhythm and rate, very soft systolic murmur, no diastolic murmur.  No rub. Lungs are clear bilaterally, breathing is nonlabored. Lower extremities show 3+ bilateral pitting edema that is a little bit tense.  No weeping. She has scattered erythematous, violaceous subcutaneous nodules anywhere from 5 mm to 15 mm in size that are mildly tender to palpation and are not fluctuant.  She has some surrounding erythema. No vesicles or pustules.  LABS:  Last CBC Lab Results  Component Value Date   WBC 7.3 11/04/2022   HGB 13.5 11/04/2022   HCT 42.5 11/04/2022   MCV 86.9 11/04/2022   MCH 27.6 11/04/2022   RDW 14.0 11/04/2022   PLT 288 11/04/2022   Last metabolic panel Lab Results  Component Value Date   GLUCOSE 93 11/16/2022   NA 142 11/16/2022   K 4.1 11/16/2022   CL 108 11/16/2022   CO2 24 11/16/2022   BUN 32 (H) 11/16/2022   CREATININE 1.51 (H) 11/16/2022   GFRNONAA 45 (L) 10/26/2022   CALCIUM 9.2 11/16/2022   PHOS 2.3 (L) 09/18/2020   PROT 6.8 11/04/2022  ALBUMIN 2.7 (L)  10/26/2022   LABGLOB 2.4 09/24/2020   AGRATIO 1.5 09/24/2020   BILITOT 0.6 11/04/2022   ALKPHOS 94 10/26/2022   AST 24 11/04/2022   ALT 35 (H) 11/04/2022   ANIONGAP 9 10/26/2022   Last lipids Lab Results  Component Value Date   CHOL 144 01/05/2022   HDL 60.90 01/05/2022   LDLCALC 61 01/05/2022   LDLDIRECT 66.0 09/10/2018   TRIG 113.0 01/05/2022   CHOLHDL 2 01/05/2022   Last thyroid functions Lab Results  Component Value Date   TSH 0.618 10/23/2022   T3TOTAL 115.0 07/04/2016   IMPRESSION AND PLAN:  #1 hypertension, blood pressure up here today.  She is quite anxious about her legs. Blood pressures typically normal at home. Continue hydralazine 100 mg twice a day and Lopressor 100 mg twice a day. Electrolytes and creatinine today.  2.  Chronic renal insufficiency stage III. She avoids NSAIDs and hydrates reasonably well. Electrolytes and creatinine today.  3.  GAD, doing well on chronic use of alprazolam 0.5 mg 3 times daily.  4.  Lower leg nodules. Differential diagnosis: Erythema nodosum, cellulitis, medication reaction (she started Jardiance about 1 month ago) I think this is erythema nodosum (w/out underlying cause, expect self limited/resolution 2-8 wks).. However, we will start doxycycline 100 mg twice daily.  Check CBC and sed rate today. Okay to continue Jardiance for now. Consider biopsy of one of the lesions upon follow-up in 1 week.  An After Visit Summary was printed and given to the patient.  FOLLOW UP: Return in about 1 week (around 12/29/2022) for f/u nodules/rash on legs.  Signed:  Santiago Bumpers, MD           12/22/2022

## 2022-12-23 LAB — CBC WITH DIFFERENTIAL/PLATELET
Basophils Absolute: 0.1 10*3/uL (ref 0.0–0.1)
Basophils Relative: 0.8 % (ref 0.0–3.0)
Eosinophils Absolute: 0.3 10*3/uL (ref 0.0–0.7)
Eosinophils Relative: 3.1 % (ref 0.0–5.0)
HCT: 38 % (ref 36.0–46.0)
Hemoglobin: 12.4 g/dL (ref 12.0–15.0)
Lymphocytes Relative: 9 % — ABNORMAL LOW (ref 12.0–46.0)
Lymphs Abs: 0.9 10*3/uL (ref 0.7–4.0)
MCHC: 32.7 g/dL (ref 30.0–36.0)
MCV: 95.5 fL (ref 78.0–100.0)
Monocytes Absolute: 0.6 10*3/uL (ref 0.1–1.0)
Monocytes Relative: 5.6 % (ref 3.0–12.0)
Neutro Abs: 8.5 10*3/uL — ABNORMAL HIGH (ref 1.4–7.7)
Neutrophils Relative %: 81.5 % — ABNORMAL HIGH (ref 43.0–77.0)
Platelets: 403 10*3/uL — ABNORMAL HIGH (ref 150.0–400.0)
RBC: 3.98 Mil/uL (ref 3.87–5.11)
RDW: 17.9 % — ABNORMAL HIGH (ref 11.5–15.5)
WBC: 10.4 10*3/uL (ref 4.0–10.5)

## 2022-12-23 LAB — BASIC METABOLIC PANEL
BUN: 25 mg/dL — ABNORMAL HIGH (ref 6–23)
CO2: 28 meq/L (ref 19–32)
Calcium: 9.3 mg/dL (ref 8.4–10.5)
Chloride: 105 meq/L (ref 96–112)
Creatinine, Ser: 1.33 mg/dL — ABNORMAL HIGH (ref 0.40–1.20)
GFR: 36.39 mL/min — ABNORMAL LOW (ref >60.00)
Glucose, Bld: 105 mg/dL — ABNORMAL HIGH (ref 70–99)
Potassium: 4.3 meq/L (ref 3.5–5.1)
Sodium: 142 meq/L (ref 135–145)

## 2022-12-23 LAB — SEDIMENTATION RATE: Sed Rate: 60 mm/h — ABNORMAL HIGH (ref 0–30)

## 2022-12-27 DIAGNOSIS — R2681 Unsteadiness on feet: Secondary | ICD-10-CM | POA: Diagnosis not present

## 2022-12-27 DIAGNOSIS — I5032 Chronic diastolic (congestive) heart failure: Secondary | ICD-10-CM | POA: Diagnosis not present

## 2022-12-28 DIAGNOSIS — M6281 Muscle weakness (generalized): Secondary | ICD-10-CM | POA: Diagnosis not present

## 2022-12-28 DIAGNOSIS — R2689 Other abnormalities of gait and mobility: Secondary | ICD-10-CM | POA: Diagnosis not present

## 2022-12-29 ENCOUNTER — Encounter: Payer: Self-pay | Admitting: Family Medicine

## 2022-12-29 ENCOUNTER — Ambulatory Visit: Payer: Medicare PPO | Admitting: Family Medicine

## 2022-12-29 VITALS — BP 178/74 | HR 60 | Wt 168.0 lb

## 2022-12-29 DIAGNOSIS — I5032 Chronic diastolic (congestive) heart failure: Secondary | ICD-10-CM | POA: Diagnosis not present

## 2022-12-29 DIAGNOSIS — I10 Essential (primary) hypertension: Secondary | ICD-10-CM

## 2022-12-29 DIAGNOSIS — L52 Erythema nodosum: Secondary | ICD-10-CM

## 2022-12-29 DIAGNOSIS — R2681 Unsteadiness on feet: Secondary | ICD-10-CM | POA: Diagnosis not present

## 2022-12-29 NOTE — Progress Notes (Signed)
OFFICE VISIT  12/29/2022  CC:  Chief Complaint  Patient presents with   Rash    F/U. Pt states rash has much improved.     Patient is a 87 y.o. female who presents for 1 week follow-up lower extremity nodules/rash. A/P as of last visit: "#1 hypertension, blood pressure up here today.  She is quite anxious about her legs. Blood pressures typically normal at home. Continue hydralazine 100 mg twice a day and Lopressor 100 mg twice a day. Electrolytes and creatinine today.   2.  Chronic renal insufficiency stage III. She avoids NSAIDs and hydrates reasonably well. Electrolytes and creatinine today.   3.  GAD, doing well on chronic use of alprazolam 0.5 mg 3 times daily.   4.  Lower leg nodules. Differential diagnosis: Erythema nodosum, cellulitis, medication reaction (she started Jardiance about 1 month ago) I think this is erythema nodosum (w/out underlying cause, expect self limited/resolution 2-8 wks).. However, we will start doxycycline 100 mg twice daily.  Check CBC and sed rate today. Okay to continue Jardiance for now. Consider biopsy of one of the lesions upon follow-up in 1 week."  INTERIM HX: Lower extremity rash/nodules have improved significantly.  No longer red at all.  Still with the small nodules but they have shrunk some. no pain or itching No fever or malaise. She took the doxycycline.  Past Medical History:  Diagnosis Date   Abnormal EKG approx 2008   Nuclear stress test neg;    Acute pancreatitis 09/2020   idiopathic.  +panc psudocyst   Anxiety    with panic   CAP (community acquired pneumonia) 08/2017   Hospitalization for CAP/acute diast HF/rapid a-fib   Cataract    s/p surgery--lens implants   Chronic diastolic heart failure (HCC) 2019   Chronic renal insufficiency, stage 3 (moderate) (HCC) 12/04/2012   Renal u/s when in hosp 08/2017 for CAP/CHF showed symmetric kidneys, echogenicity normal, w/out hydronephrosis. Baseline GFR around 40 ml/min as of  10/2018.   DDD (degenerative disc disease), lumbar    Diverticulosis 2009   Fracture of radial shaft, left, closed 11/16/2010   fell down flight of stairs   History of kidney stones    Hx of adenomatous colonic polyps 2002;2009;2015   surveillance colonoscopy 2009, +polypectomy done-tubular adenoma w/out high grade.  05/2013 tubular adenomas--recall 3 yrs   Hyperlipidemia    Hypertension    Low TSH level 02/18/2016   T3 norm, T4 mildly elevated--suspected sick euthyroid syndrome.  Repeat labs 06/2016: normal.   Melanoma in situ (HCC) 06/2018   L LL   Osteoarthritis of both knees    viscosupplementation injections helpful 2020/21   Osteopenia    DEXA 08/2010; repeat DEXA 02/2015 worse: fosamax started.  06/2018 Dexa T score -2.4.  2020 maj osteop fx risk = 24%, Hip fx risk 7.3%. 09/2020 T score -2.1   PAF (paroxysmal atrial fibrillation) (HCC) 02/2016   2018.   DCCV x 2 2024.   Peripheral edema    Pneumonia 2015   hx with sepsis   Rheumatic fever     Past Surgical History:  Procedure Laterality Date   APPENDECTOMY  1966   done during surgery for tubal pregnancy   CARDIOVERSION N/A 06/17/2022   Procedure: CARDIOVERSION;  Surgeon: Jodelle Red, MD;  Location: [facility] INVASIVE CV LAB;  Service: Cardiovascular;  Laterality: N/A;   CARDIOVERSION N/A 07/11/2022   Procedure: CARDIOVERSION;  Surgeon: Dolores Patty, MD;  Location: [facility] INVASIVE CV LAB;  Service: Cardiovascular;  Laterality:  N/A;   CATARACT EXTRACTION W/ INTRAOCULAR LENS IMPLANT  2013   bilat   COLONOSCOPY W/ POLYPECTOMY  05/2013   +diverticulosis; recall 3 yrs (Dr. Marina Goodell)   DEXA  02/2015; 06/2018   T score -2.1 in both femoral necks; FRAX 10 yr risk of major osteoporotic fracture was 21%---fosamax started. 06/2018 T score -2.4.  T score 09/2020 -2.1. Rpt 2 yrs.   ECTOPIC PREGNANCY SURGERY     EYE SURGERY     LEFT HEART CATH AND CORONARY ANGIOGRAPHY N/A 09/01/2017   No angiographically significant CAD.  Upper normal  left ventricular filling pressure.  Procedure: LEFT HEART CATH AND CORONARY ANGIOGRAPHY;  Surgeon: Yvonne Kendall, MD;  Location: [facility] INVASIVE CV LAB;  Service: Cardiovascular;  Laterality: N/A;   LUMBAR LAMINECTOMY/DECOMPRESSION MICRODISCECTOMY N/A 09/26/2018   Procedure: Decompressive Lumbar Laminectomy L5 S1 FORAMINOTOMY L5 S1  NERVE ROOT BILATERALLY and Microdiscectomy L5-S1 Left;  Surgeon: Ranee Gosselin, MD;  Location: [facility] ORS;  Service: Orthopedics;  Laterality: N/A;    OPEN REDUCTION INTERNAL FIXATION (ORIF) TIBIA/FIBULA FRACTURE Left 02/18/2016   Procedure: OPEN REDUCTION INTERNAL FIXATION (ORIF) Right ankle trimalleolar fracture;  Surgeon: Toni Arthurs, MD;  Location: [facility] OR;  Service: Orthopedics;  Laterality: Left;  requests   ORIF RADIAL FRACTURE  11/18/2010   left; s/p slip on slippery floor and fell   THORACENTESIS  08/2017   diagnostic and therapeutic.  Transudative.  Clx neg.  (+pulm edema/diastolic HF)   TONSILLECTOMY     TRANSTHORACIC ECHOCARDIOGRAM  02/18/2016; 08/10/17   LVEF of 55-60%, mild AI and mild MR and normal biatrial size.  07/2017--normal LV function, mild enlarge aortic root, mild/mod TR, bilat atrial enlargement. 06/09/22 EF 50-55%, mod MR, aortic root stable enlgmt    Outpatient Medications Prior to Visit  Medication Sig Dispense Refill   acetaminophen (TYLENOL) 650 MG CR tablet Take 1,300 mg by mouth daily.     albuterol (VENTOLIN HFA) 108 (90 Base) MCG/ACT inhaler INHALE 2 PUFFS BY MOUTH into THE lungs EVERY 4 HOURS AS NEEDED FOR WHEEZING OR SHORTNESS OF BREATH (Patient taking differently: Inhale 2 puffs into the lungs every 4 (four) hours as needed for shortness of breath.) 18 g 1   ALPRAZolam (XANAX) 0.5 MG tablet Take 1 tablet (0.5 mg total) by mouth 3 (three) times daily as needed for anxiety. TAKE 1 TABLET BY MOUTH 3 TIMES DAILY AS NEEDED FOR ANXIETY (Patient taking differently: Take 0.5 mg by mouth 3 (three) times daily as needed for anxiety.) 90  tablet 5   amiodarone (PACERONE) 200 MG tablet Take 1 tablet (200 mg total) by mouth daily. 90 tablet 3   apixaban (ELIQUIS) 5 MG TABS tablet Take 1 tablet (5 mg total) by mouth 2 (two) times daily. 60 tablet 5   Apoaequorin (PREVAGEN PO) Take 1 tablet by mouth daily.     Ascorbic Acid (VITAMIN C PO) Take by mouth daily.     atorvastatin (LIPITOR) 40 MG tablet TAKE 1 TABLET BY MOUTH EVERY DAY 90 tablet 1   Biotin 5000 MCG TABS Take 5,000 mcg by mouth daily.     Calcium Carbonate (CALCIUM 600 PO) Take 1,200 mg by mouth daily.      carboxymethylcellulose (REFRESH PLUS) 0.5 % SOLN Place 2 drops into both eyes daily as needed (dry/irritated eyes.).     Coenzyme Q10 200 MG capsule Take 200 mg by mouth daily.     doxycycline (VIBRAMYCIN) 100 MG capsule Take 1 capsule (100 mg total) by mouth 2 (two)  times daily for 7 days. 14 capsule 0   fluticasone (FLONASE) 50 MCG/ACT nasal spray Place 2 sprays into both nostrils daily.      furosemide (LASIX) 20 MG tablet Take 1 tablet (20 mg total) by mouth daily. 90 tablet 3   hydrALAZINE (APRESOLINE) 100 MG tablet Take 1 tablet (100 mg total) by mouth 2 (two) times daily. (Patient taking differently: Take 100 mg by mouth 3 (three) times daily.) 270 tablet 1   hydrocortisone 2.5 % cream APPLY DAILY AS NEEDED TO RASH ON FEET AND LEGS 30 g 1   hydrocortisone cream 1 % Apply 1 Application topically as needed for itching.     JARDIANCE 10 MG TABS tablet TAKE 1 TABLET BY MOUTH EVERY DAY BEFORE BREAKFAST 90 tablet 3   metoprolol tartrate (LOPRESSOR) 100 MG tablet TAKE 1 TABLET BY MOUTH 2 TIMES DAILY 90 tablet 3   Multiple Vitamins-Minerals (CENTRUM SILVER ULTRA WOMENS) TABS Take 1 tablet by mouth every evening.     potassium chloride SA (KLOR-CON M) 20 MEQ tablet Take 1 tablet (20 mEq total) by mouth daily. 90 tablet 3   tiZANidine (ZANAFLEX) 2 MG tablet Take 1 tablet (2 mg total) by mouth every 8 (eight) hours as needed for muscle spasms. 15 tablet 0   XARELTO 15 MG  TABS tablet Take 15 mg by mouth daily.     No facility-administered medications prior to visit.    Allergies  Allergen Reactions   Augmentin [Amoxicillin-Pot Clavulanate] Nausea And Vomiting and Other (See Comments)    "projectile vomiting" Has patient had a PCN reaction causing immediate rash, facial/tongue/throat swelling, SOB or lightheadedness with hypotension:No Has patient had a PCN reaction causing severe rash involving mucus membranes or skin necrosis:No Has patient had a PCN reaction that required hospitalization:No Has patient had a PCN reaction occurring within the last 10 years:Yes If all of the above answers are "NO", then may proceed with Cephalosporin use.    Amoxicillin Rash   Clindamycin/Lincomycin Rash    Review of Systems As per HPI  PE:    12/29/2022    2:47 PM 12/22/2022    2:10 PM 12/09/2022    3:21 PM  Vitals with BMI  Height   5' 1.5"  Weight 168 lbs 168 lbs 13 oz 169 lbs 13 oz  BMI 31.23 31.38 31.57  Systolic 178 192 027  Diastolic 74 77 94  Pulse 60 62 60     Physical Exam  Gen: Alert, well appearing.  Patient is oriented to person, place, time, and situation. Cardiovascular: Regular rhythm and rate without murmur. Lungs are clear bilaterally, breathing is nonlabored. Lower legs: Several pinkish subcutaneous nodules scattered over both lower legs diffusely.  No erythema.  Directly over the nodules there is light superficial desquamation that is dry.  A couple of these have central small scab. No pustules, vesicles, or petechiae. Nontender.  LABS:  Last metabolic panel Lab Results  Component Value Date   GLUCOSE 105 (H) 12/22/2022   NA 142 12/22/2022   K 4.3 12/22/2022   CL 105 12/22/2022   CO2 28 12/22/2022   BUN 25 (H) 12/22/2022   CREATININE 1.33 (H) 12/22/2022   GFR 36.39 (L) 12/22/2022   CALCIUM 9.3 12/22/2022   PHOS 2.3 (L) 09/18/2020   PROT 6.8 11/04/2022   ALBUMIN 2.7 (L) 10/26/2022   LABGLOB 2.4 09/24/2020   AGRATIO 1.5  09/24/2020   BILITOT 0.6 11/04/2022   ALKPHOS 94 10/26/2022   AST 24 11/04/2022  ALT 35 (H) 11/04/2022   ANIONGAP 9 10/26/2022   Lab Results  Component Value Date   WBC 10.4 12/22/2022   HGB 12.4 12/22/2022   HCT 38.0 12/22/2022   MCV 95.5 12/22/2022   PLT 403.0 (H) 12/22/2022   Lab Results  Component Value Date   ESRSEDRATE 60 (H) 12/22/2022   IMPRESSION AND PLAN:  #1 erythema nodosum, significant improvement over the last week. Low suspicion that this was infection so we will not do any further antibiotic at this time. Sed rate was a little bit up.  We will recheck this upon follow-up in 3 weeks.  2.  Hypertension, significant whitecoat component. Continue hydralazine 100 mg twice a day and Lopressor 100 mg twice a day. Electrolytes and kidney function stable 1 week ago.  #3 PAF. Regular rhythm today. She takes Eliquis 5 mg twice a day. Of note, Xarelto is currently on her med list but this is incorrect.  I have taken it off her med list today.  An After Visit Summary was printed and given to the patient.  FOLLOW UP: Return in about 3 weeks (around 01/19/2023) for f/u legs/eryth nodo.  Signed:  Santiago Bumpers, MD           12/29/2022

## 2022-12-30 DIAGNOSIS — M6281 Muscle weakness (generalized): Secondary | ICD-10-CM | POA: Diagnosis not present

## 2022-12-30 DIAGNOSIS — R2689 Other abnormalities of gait and mobility: Secondary | ICD-10-CM | POA: Diagnosis not present

## 2023-01-02 ENCOUNTER — Other Ambulatory Visit: Payer: Self-pay

## 2023-01-02 ENCOUNTER — Inpatient Hospital Stay (HOSPITAL_COMMUNITY): Payer: Medicare PPO

## 2023-01-02 ENCOUNTER — Emergency Department (HOSPITAL_COMMUNITY): Payer: Medicare PPO

## 2023-01-02 ENCOUNTER — Inpatient Hospital Stay (HOSPITAL_COMMUNITY)
Admission: EM | Admit: 2023-01-02 | Discharge: 2023-01-14 | DRG: 291 | Disposition: A | Discharge status: Skilled Nursing Facility | Payer: Medicare PPO | Source: Skilled Nursing Facility | Attending: Family Medicine | Admitting: Family Medicine

## 2023-01-02 ENCOUNTER — Encounter (HOSPITAL_COMMUNITY): Payer: Self-pay | Admitting: Internal Medicine

## 2023-01-02 DIAGNOSIS — J9601 Acute respiratory failure with hypoxia: Secondary | ICD-10-CM | POA: Diagnosis not present

## 2023-01-02 DIAGNOSIS — Z7401 Bed confinement status: Secondary | ICD-10-CM | POA: Diagnosis not present

## 2023-01-02 DIAGNOSIS — L52 Erythema nodosum: Secondary | ICD-10-CM | POA: Diagnosis present

## 2023-01-02 DIAGNOSIS — M8580 Other specified disorders of bone density and structure, unspecified site: Secondary | ICD-10-CM | POA: Diagnosis present

## 2023-01-02 DIAGNOSIS — T502X5A Adverse effect of carbonic-anhydrase inhibitors, benzothiadiazides and other diuretics, initial encounter: Secondary | ICD-10-CM | POA: Diagnosis not present

## 2023-01-02 DIAGNOSIS — T4275XA Adverse effect of unspecified antiepileptic and sedative-hypnotic drugs, initial encounter: Secondary | ICD-10-CM | POA: Diagnosis not present

## 2023-01-02 DIAGNOSIS — I440 Atrioventricular block, first degree: Secondary | ICD-10-CM | POA: Diagnosis not present

## 2023-01-02 DIAGNOSIS — Z880 Allergy status to penicillin: Secondary | ICD-10-CM

## 2023-01-02 DIAGNOSIS — N179 Acute kidney failure, unspecified: Secondary | ICD-10-CM | POA: Diagnosis not present

## 2023-01-02 DIAGNOSIS — M170 Bilateral primary osteoarthritis of knee: Secondary | ICD-10-CM | POA: Diagnosis present

## 2023-01-02 DIAGNOSIS — F419 Anxiety disorder, unspecified: Secondary | ICD-10-CM | POA: Diagnosis not present

## 2023-01-02 DIAGNOSIS — Z7984 Long term (current) use of oral hypoglycemic drugs: Secondary | ICD-10-CM

## 2023-01-02 DIAGNOSIS — R338 Other retention of urine: Secondary | ICD-10-CM

## 2023-01-02 DIAGNOSIS — Z79899 Other long term (current) drug therapy: Secondary | ICD-10-CM

## 2023-01-02 DIAGNOSIS — Z87442 Personal history of urinary calculi: Secondary | ICD-10-CM

## 2023-01-02 DIAGNOSIS — I509 Heart failure, unspecified: Secondary | ICD-10-CM | POA: Diagnosis not present

## 2023-01-02 DIAGNOSIS — R0989 Other specified symptoms and signs involving the circulatory and respiratory systems: Secondary | ICD-10-CM | POA: Diagnosis not present

## 2023-01-02 DIAGNOSIS — E785 Hyperlipidemia, unspecified: Secondary | ICD-10-CM | POA: Diagnosis present

## 2023-01-02 DIAGNOSIS — K5900 Constipation, unspecified: Secondary | ICD-10-CM | POA: Diagnosis not present

## 2023-01-02 DIAGNOSIS — F32A Depression, unspecified: Secondary | ICD-10-CM | POA: Diagnosis not present

## 2023-01-02 DIAGNOSIS — K5790 Diverticulosis of intestine, part unspecified, without perforation or abscess without bleeding: Secondary | ICD-10-CM | POA: Diagnosis not present

## 2023-01-02 DIAGNOSIS — R001 Bradycardia, unspecified: Secondary | ICD-10-CM | POA: Diagnosis not present

## 2023-01-02 DIAGNOSIS — Z1152 Encounter for screening for COVID-19: Secondary | ICD-10-CM

## 2023-01-02 DIAGNOSIS — I5033 Acute on chronic diastolic (congestive) heart failure: Secondary | ICD-10-CM | POA: Diagnosis not present

## 2023-01-02 DIAGNOSIS — G928 Other toxic encephalopathy: Secondary | ICD-10-CM | POA: Diagnosis not present

## 2023-01-02 DIAGNOSIS — Z860101 Personal history of adenomatous and serrated colon polyps: Secondary | ICD-10-CM

## 2023-01-02 DIAGNOSIS — I5031 Acute diastolic (congestive) heart failure: Secondary | ICD-10-CM | POA: Diagnosis not present

## 2023-01-02 DIAGNOSIS — Z8701 Personal history of pneumonia (recurrent): Secondary | ICD-10-CM

## 2023-01-02 DIAGNOSIS — Z9849 Cataract extraction status, unspecified eye: Secondary | ICD-10-CM

## 2023-01-02 DIAGNOSIS — R0902 Hypoxemia: Secondary | ICD-10-CM | POA: Diagnosis not present

## 2023-01-02 DIAGNOSIS — E876 Hypokalemia: Secondary | ICD-10-CM | POA: Diagnosis not present

## 2023-01-02 DIAGNOSIS — I4430 Unspecified atrioventricular block: Secondary | ICD-10-CM | POA: Diagnosis not present

## 2023-01-02 DIAGNOSIS — R918 Other nonspecific abnormal finding of lung field: Secondary | ICD-10-CM | POA: Diagnosis not present

## 2023-01-02 DIAGNOSIS — R531 Weakness: Secondary | ICD-10-CM | POA: Diagnosis not present

## 2023-01-02 DIAGNOSIS — Z833 Family history of diabetes mellitus: Secondary | ICD-10-CM

## 2023-01-02 DIAGNOSIS — E7800 Pure hypercholesterolemia, unspecified: Secondary | ICD-10-CM | POA: Diagnosis not present

## 2023-01-02 DIAGNOSIS — Z881 Allergy status to other antibiotic agents status: Secondary | ICD-10-CM

## 2023-01-02 DIAGNOSIS — I130 Hypertensive heart and chronic kidney disease with heart failure and stage 1 through stage 4 chronic kidney disease, or unspecified chronic kidney disease: Principal | ICD-10-CM | POA: Diagnosis present

## 2023-01-02 DIAGNOSIS — R339 Retention of urine, unspecified: Secondary | ICD-10-CM | POA: Diagnosis present

## 2023-01-02 DIAGNOSIS — N1832 Chronic kidney disease, stage 3b: Secondary | ICD-10-CM | POA: Diagnosis not present

## 2023-01-02 DIAGNOSIS — J810 Acute pulmonary edema: Secondary | ICD-10-CM | POA: Diagnosis not present

## 2023-01-02 DIAGNOSIS — I480 Paroxysmal atrial fibrillation: Secondary | ICD-10-CM | POA: Diagnosis present

## 2023-01-02 DIAGNOSIS — Z86006 Personal history of melanoma in-situ: Secondary | ICD-10-CM

## 2023-01-02 DIAGNOSIS — R0603 Acute respiratory distress: Secondary | ICD-10-CM

## 2023-01-02 DIAGNOSIS — Z961 Presence of intraocular lens: Secondary | ICD-10-CM | POA: Diagnosis present

## 2023-01-02 DIAGNOSIS — L03115 Cellulitis of right lower limb: Secondary | ICD-10-CM

## 2023-01-02 DIAGNOSIS — L0390 Cellulitis, unspecified: Secondary | ICD-10-CM | POA: Diagnosis not present

## 2023-01-02 DIAGNOSIS — I161 Hypertensive emergency: Secondary | ICD-10-CM | POA: Diagnosis not present

## 2023-01-02 DIAGNOSIS — Z7901 Long term (current) use of anticoagulants: Secondary | ICD-10-CM | POA: Diagnosis not present

## 2023-01-02 DIAGNOSIS — I110 Hypertensive heart disease with heart failure: Secondary | ICD-10-CM | POA: Diagnosis not present

## 2023-01-02 DIAGNOSIS — I10 Essential (primary) hypertension: Secondary | ICD-10-CM | POA: Diagnosis present

## 2023-01-02 DIAGNOSIS — R0602 Shortness of breath: Secondary | ICD-10-CM | POA: Diagnosis not present

## 2023-01-02 DIAGNOSIS — Z8249 Family history of ischemic heart disease and other diseases of the circulatory system: Secondary | ICD-10-CM

## 2023-01-02 LAB — RESPIRATORY PANEL BY PCR

## 2023-01-02 LAB — I-STAT VENOUS BLOOD GAS, ED
Acid-Base Excess: 1 mmol/L (ref 0.0–2.0)
Bicarbonate: 25.3 mmol/L (ref 20.0–28.0)
Calcium, Ion: 1.19 mmol/L (ref 1.15–1.40)
HCT: 32 % — ABNORMAL LOW (ref 36.0–46.0)
Hemoglobin: 10.9 g/dL — ABNORMAL LOW (ref 12.0–15.0)
O2 Saturation: 97 %
Potassium: 4.2 mmol/L (ref 3.5–5.1)
Sodium: 139 mmol/L (ref 135–145)
TCO2: 26 mmol/L (ref 22–32)
pCO2, Ven: 38.8 mm[Hg] — ABNORMAL LOW (ref 44–60)
pH, Ven: 7.422 (ref 7.25–7.43)
pO2, Ven: 89 mm[Hg] — ABNORMAL HIGH (ref 32–45)

## 2023-01-02 LAB — C-REACTIVE PROTEIN: CRP: 6.6 mg/dL — ABNORMAL HIGH (ref <1.0)

## 2023-01-02 LAB — CBC WITH DIFFERENTIAL/PLATELET
Abs Immature Granulocytes: 0.04 10*3/uL (ref 0.00–0.07)
Basophils Absolute: 0.1 10*3/uL (ref 0.0–0.1)
Basophils Relative: 1 %
Eosinophils Absolute: 0.2 10*3/uL (ref 0.0–0.5)
Eosinophils Relative: 2 %
HCT: 34 % — ABNORMAL LOW (ref 36.0–46.0)
Hemoglobin: 10.3 g/dL — ABNORMAL LOW (ref 12.0–15.0)
Immature Granulocytes: 0 %
Lymphocytes Relative: 9 %
Lymphs Abs: 0.9 10*3/uL (ref 0.7–4.0)
MCH: 29.2 pg (ref 26.0–34.0)
MCHC: 30.3 g/dL (ref 30.0–36.0)
MCV: 96.3 fL (ref 80.0–100.0)
Monocytes Absolute: 0.8 10*3/uL (ref 0.1–1.0)
Monocytes Relative: 8 %
Neutro Abs: 7.8 10*3/uL — ABNORMAL HIGH (ref 1.7–7.7)
Neutrophils Relative %: 80 %
Platelets: 261 10*3/uL (ref 150–400)
RBC: 3.53 MIL/uL — ABNORMAL LOW (ref 3.87–5.11)
RDW: 15.9 % — ABNORMAL HIGH (ref 11.5–15.5)
WBC: 9.8 10*3/uL (ref 4.0–10.5)
nRBC: 0 % (ref 0.0–0.2)

## 2023-01-02 LAB — TROPONIN I (HIGH SENSITIVITY)
Troponin I (High Sensitivity): 13 ng/L (ref <18)
Troponin I (High Sensitivity): 16 ng/L (ref <18)

## 2023-01-02 LAB — BRAIN NATRIURETIC PEPTIDE: B Natriuretic Peptide: 1006.1 pg/mL — ABNORMAL HIGH (ref 0.0–100.0)

## 2023-01-02 LAB — PROCALCITONIN: Procalcitonin: <0.1 ng/mL

## 2023-01-02 LAB — COMPREHENSIVE METABOLIC PANEL
ALT: 24 U/L (ref 0–44)
AST: 31 U/L (ref 15–41)
Albumin: 2.8 g/dL — ABNORMAL LOW (ref 3.5–5.0)
Alkaline Phosphatase: 111 U/L (ref 38–126)
Anion gap: 8 (ref 5–15)
BUN: 20 mg/dL (ref 8–23)
CO2: 20 mmol/L — ABNORMAL LOW (ref 22–32)
Calcium: 9 mg/dL (ref 8.9–10.3)
Chloride: 107 mmol/L (ref 98–111)
Creatinine, Ser: 1.19 mg/dL — ABNORMAL HIGH (ref 0.44–1.00)
GFR, Estimated: 45 mL/min — ABNORMAL LOW (ref >60)
Glucose, Bld: 109 mg/dL — ABNORMAL HIGH (ref 70–99)
Potassium: 4.1 mmol/L (ref 3.5–5.1)
Sodium: 135 mmol/L (ref 135–145)
Total Bilirubin: 1.4 mg/dL — ABNORMAL HIGH (ref <1.2)
Total Protein: 6.3 g/dL — ABNORMAL LOW (ref 6.5–8.1)

## 2023-01-02 LAB — SARS CORONAVIRUS 2 BY RT PCR: SARS Coronavirus 2 by RT PCR: NEGATIVE

## 2023-01-02 MED ORDER — NITROGLYCERIN IN D5W 200-5 MCG/ML-% IV SOLN
0.0000 ug/min | INTRAVENOUS | Status: DC
  Administered 2023-01-02: 10 ug/min via INTRAVENOUS
  Filled 2023-01-02: qty 250

## 2023-01-02 MED ORDER — FUROSEMIDE 10 MG/ML IJ SOLN
40.0000 mg | Freq: Once | INTRAMUSCULAR | Status: AC
  Administered 2023-01-02: 40 mg via INTRAVENOUS
  Filled 2023-01-02: qty 4

## 2023-01-02 MED ORDER — APIXABAN 5 MG PO TABS
5.0000 mg | ORAL_TABLET | Freq: Two times a day (BID) | ORAL | Status: DC
  Administered 2023-01-02 – 2023-01-12 (×20): 5 mg via ORAL
  Filled 2023-01-02 (×21): qty 1

## 2023-01-02 MED ORDER — HYDRALAZINE HCL 50 MG PO TABS
100.0000 mg | ORAL_TABLET | Freq: Once | ORAL | Status: DC
  Filled 2023-01-02: qty 4

## 2023-01-02 MED ORDER — METOPROLOL TARTRATE 25 MG PO TABS: 50.0000 mg | ORAL_TABLET | Freq: Two times a day (BID) | ORAL | Status: DC

## 2023-01-02 MED ORDER — METOPROLOL TARTRATE 25 MG PO TABS: 100.0000 mg | ORAL_TABLET | Freq: Two times a day (BID) | ORAL | Status: DC

## 2023-01-02 MED ORDER — ACETAMINOPHEN 650 MG RE SUPP
650.0000 mg | Freq: Four times a day (QID) | RECTAL | Status: DC | PRN
Start: 2023-01-02 — End: 2023-01-09

## 2023-01-02 MED ORDER — ORAL CARE MOUTH RINSE: 15.0000 mL | OROMUCOSAL | Status: DC | PRN

## 2023-01-02 MED ORDER — METOPROLOL TARTRATE 25 MG PO TABS: 100.0000 mg | ORAL_TABLET | Freq: Once | ORAL | Status: DC

## 2023-01-02 MED ORDER — ACETAMINOPHEN 325 MG PO TABS
650.0000 mg | ORAL_TABLET | Freq: Four times a day (QID) | ORAL | Status: AC | PRN
  Administered 2023-01-04 – 2023-01-09 (×6): 650 mg via ORAL
  Filled 2023-01-02 (×6): qty 2

## 2023-01-02 MED ORDER — FUROSEMIDE 10 MG/ML IJ SOLN
40.0000 mg | Freq: Two times a day (BID) | INTRAMUSCULAR | Status: DC
  Administered 2023-01-02: 40 mg via INTRAVENOUS
  Filled 2023-01-02: qty 4

## 2023-01-02 MED ORDER — HYDRALAZINE HCL 50 MG PO TABS: 100.0000 mg | ORAL_TABLET | Freq: Three times a day (TID) | ORAL | Status: DC

## 2023-01-02 MED ORDER — IRBESARTAN 75 MG PO TABS
75.0000 mg | ORAL_TABLET | Freq: Every day | ORAL | Status: DC
  Administered 2023-01-02 – 2023-01-05 (×4): 75 mg via ORAL
  Filled 2023-01-02 (×4): qty 1

## 2023-01-02 MED ORDER — ALBUTEROL SULFATE (2.5 MG/3ML) 0.083% IN NEBU: 2.5000 mg | INHALATION_SOLUTION | RESPIRATORY_TRACT | Status: DC | PRN

## 2023-01-02 MED ORDER — EMPAGLIFLOZIN 10 MG PO TABS
10.0000 mg | ORAL_TABLET | Freq: Every day | ORAL | Status: DC
  Administered 2023-01-03 – 2023-01-12 (×10): 10 mg via ORAL
  Filled 2023-01-02 (×10): qty 1

## 2023-01-02 MED ORDER — HYDRALAZINE HCL 50 MG PO TABS: 50.0000 mg | ORAL_TABLET | Freq: Four times a day (QID) | ORAL | Status: DC

## 2023-01-02 MED ORDER — HYDRALAZINE HCL 25 MG PO TABS
25.0000 mg | ORAL_TABLET | Freq: Four times a day (QID) | ORAL | Status: DC
  Administered 2023-01-02 – 2023-01-13 (×34): 25 mg via ORAL
  Filled 2023-01-02 (×38): qty 1

## 2023-01-02 MED ORDER — AMIODARONE HCL 200 MG PO TABS
200.0000 mg | ORAL_TABLET | Freq: Every day | ORAL | Status: DC
  Administered 2023-01-03 – 2023-01-14 (×12): 200 mg via ORAL
  Filled 2023-01-02 (×13): qty 1

## 2023-01-02 MED ORDER — METOPROLOL TARTRATE 25 MG PO TABS
100.0000 mg | ORAL_TABLET | Freq: Two times a day (BID) | ORAL | Status: DC
  Filled 2023-01-02: qty 4

## 2023-01-02 MED ORDER — METOPROLOL TARTRATE 100 MG PO TABS
100.0000 mg | ORAL_TABLET | Freq: Two times a day (BID) | ORAL | Status: DC
  Administered 2023-01-03: 100 mg via ORAL
  Filled 2023-01-02 (×2): qty 1

## 2023-01-02 MED ORDER — TIZANIDINE HCL 4 MG PO TABS
2.0000 mg | ORAL_TABLET | Freq: Three times a day (TID) | ORAL | Status: DC | PRN
  Administered 2023-01-09: 2 mg via ORAL
  Filled 2023-01-02 (×2): qty 1

## 2023-01-02 MED ORDER — SODIUM CHLORIDE 0.9% FLUSH
3.0000 mL | Freq: Two times a day (BID) | INTRAVENOUS | Status: DC
  Administered 2023-01-02 – 2023-01-13 (×17): 3 mL via INTRAVENOUS

## 2023-01-02 MED ORDER — ALPRAZOLAM 0.5 MG PO TABS
0.5000 mg | ORAL_TABLET | Freq: Three times a day (TID) | ORAL | Status: DC | PRN
  Administered 2023-01-02 – 2023-01-10 (×12): 0.5 mg via ORAL
  Filled 2023-01-02 (×12): qty 1

## 2023-01-02 NOTE — ED Triage Notes (Signed)
Pt BIB by EMS from [facility]. SOB worsened around 2am. 2 sublingual nitroglycerin given by EMS. Known CHF. Denies N/V/D.

## 2023-01-02 NOTE — Progress Notes (Signed)
Pt c/o discomfort at the Foley insertion site and along urethra. She is requesting Foley to be removed. Site assessed, no abnormality, Foley repositioned.  Brenda Shy, MD notified. MD recommended keeping Foley catheter in place for urinary retention.  On reassessment pt asleep, resting comfortably.  Will continue to monitor pt.

## 2023-01-02 NOTE — ED Notes (Signed)
ED TO INPATIENT HANDOFF REPORT  ED Nurse Name and Phone #: Evaristo Bury Name/Age/Gender Brenda Matthews 87 y.o. female Room/Bed: 043C/043C  Code Status   Code Status: Full Code  Home/SNF/Other Home Patient oriented to: self, place, time, and situation Is this baseline? Yes   Triage Complete: Triage complete  Chief Complaint Acute respiratory failure with hypoxemia (HCC) [J96.01]  Triage Note Pt BIB by EMS from [facility]. SOB worsened around 2am. 2 sublingual nitroglycerin given by EMS. Known CHF. Denies N/V/D.   Allergies Allergies  Allergen Reactions   Augmentin [Amoxicillin-Pot Clavulanate] Nausea And Vomiting and Other (See Comments)    "projectile vomiting" Has patient had a PCN reaction causing immediate rash, facial/tongue/throat swelling, SOB or lightheadedness with hypotension:No Has patient had a PCN reaction causing severe rash involving mucus membranes or skin necrosis:No Has patient had a PCN reaction that required hospitalization:No Has patient had a PCN reaction occurring within the last 10 years:Yes If all of the above answers are "NO", then may proceed with Cephalosporin use.    Amoxicillin Rash   Clindamycin/Lincomycin Rash    Level of Care/Admitting Diagnosis ED Disposition     ED Disposition  Admit   Condition  --   Comment  Hospital Area: [facility] [[id number]]  Level of Care: Telemetry Cardiac [[id number]]  May admit patient to [facility] or [facility] if equivalent level of care is available:: No  Covid Evaluation: Asymptomatic - no recent exposure (last 10 days) testing not required  Diagnosis: Acute respiratory failure with hypoxemia [facility] [[id number]]  Admitting Physician: Marinda Elk [[id number]]  Attending Physician: Marinda Elk [[id number]]  Certification:: I certify this patient will need inpatient services for at least 2 midnights  Expected Medical Readiness: 01/05/2023          B Medical/Surgery  History Past Medical History:  Diagnosis Date   Abnormal EKG approx 2008   Nuclear stress test neg;    Acute pancreatitis 09/2020   idiopathic.  +panc psudocyst   Anxiety    with panic   CAP (community acquired pneumonia) 08/2017   Hospitalization for CAP/acute diast HF/rapid a-fib   Cataract    s/p surgery--lens implants   Chronic diastolic heart failure (HCC) 2019   Chronic renal insufficiency, stage 3 (moderate) (HCC) 12/04/2012   Renal u/s when in hosp 08/2017 for CAP/CHF showed symmetric kidneys, echogenicity normal, w/out hydronephrosis. Baseline GFR around 40 ml/min as of 10/2018.   DDD (degenerative disc disease), lumbar    Diverticulosis 2009   Fracture of radial shaft, left, closed 11/16/2010   fell down flight of stairs   History of kidney stones    Hx of adenomatous colonic polyps 2002;2009;2015   surveillance colonoscopy 2009, +polypectomy done-tubular adenoma w/out high grade.  05/2013 tubular adenomas--recall 3 yrs   Hyperlipidemia    Hypertension    Low TSH level 02/18/2016   T3 norm, T4 mildly elevated--suspected sick euthyroid syndrome.  Repeat labs 06/2016: normal.   Melanoma in situ (HCC) 06/2018   L LL   Osteoarthritis of both knees    viscosupplementation injections helpful 2020/21   Osteopenia    DEXA 08/2010; repeat DEXA 02/2015 worse: fosamax started.  06/2018 Dexa T score -2.4.  2020 maj osteop fx risk = 24%, Hip fx risk 7.3%. 09/2020 T score -2.1   PAF (paroxysmal atrial fibrillation) (HCC) 02/2016   2018.   DCCV x 2 2024.   Peripheral edema    Pneumonia 2015   hx with  sepsis   Rheumatic fever    Past Surgical History:  Procedure Laterality Date   APPENDECTOMY  1966   done during surgery for tubal pregnancy   CARDIOVERSION N/A 06/17/2022   Procedure: CARDIOVERSION;  Surgeon: Jodelle Red, MD;  [facility] INVAS[facility] CV LAB;  Service: Cardiovascular;  Laterality: N/A;   CARDIOVERSION N/A 07/11/2022   Procedure: CARDIOVERSION;  Surgeon:  Dolores Patty, MD;  Location: [facility] INVASIVE CV LAB;  Service: Cardiovascular;  Laterality: N/A;   CATARACT EXTRACTION W/ INTRAOCULAR LENS IMPLANT  2013   bilat   COLONOSCOPY W/ POLYPECTOMY  05/2013   +diverticulosis; recall 3 yrs (Dr. Marina Goodell)   DEXA  02/2015; 06/2018   T score -2.1 in both femoral necks; FRAX 10 yr risk of major osteoporotic fracture was 21%---fosamax started. 06/2018 T score -2.4.  T score 09/2020 -2.1. Rpt 2 yrs.   ECTOPIC PREGNANCY SURGERY     EYE SURGERY     LEFT HEART CATH AND CORONARY ANGIOGRAPHY N/A 09/01/2017   No angiographically significant CAD.  Upper normal left ventricular filling pressure.  Procedure: LEFT HEART CATH AND CORONARY ANGIOGRAPHY;  Surgeon: Yvonne Kendall, MD;  Location: [facility] INVASIVE CV LAB;  Service: Cardiovascular;  Laterality: N/A;   LUMBAR LAMINECTOMY/DECOMPRESSION MICRODISCECTOMY N/A 09/26/2018   Procedure: Decompressive Lumbar Laminectomy L5 S1 FORAMINOTOMY L5 S1  NERVE ROOT BILATERALLY and Microdiscectomy L5-S1 Left;  Surgeon: Ranee Gosselin, MD;  Location: [facility] ORS;  Service: Orthopedics;  Laterality: N/A;    OPEN REDUCTION INTERNAL FIXATION (ORIF) TIBIA/FIBULA FRACTURE Left 02/18/2016   Procedure: OPEN REDUCTION INTERNAL FIXATION (ORIF) Right ankle trimalleolar fracture;  Surgeon: Toni Arthurs, MD;  Location: [facility] OR;  Service: Orthopedics;  Laterality: Left;  requests   ORIF RADIAL FRACTURE  11/18/2010   left; s/p slip on slippery floor and fell   THORACENTESIS  08/2017   diagnostic and therapeutic.  Transudative.  Clx neg.  (+pulm edema/diastolic HF)   TONSILLECTOMY     TRANSTHORACIC ECHOCARDIOGRAM  02/18/2016; 08/10/17   LVEF of 55-60%, mild AI and mild MR and normal biatrial size.  07/2017--normal LV function, mild enlarge aortic root, mild/mod TR, bilat atrial enlargement. 06/09/22 EF 50-55%, mod MR, aortic root stable enlgmt     A IV Location/Drains/Wounds Patient Lines/Drains/Airways Status     Active Line/Drains/Airways      Name Placement date Placement time Site Days   Peripheral IV 01/02/23 20 G Anterior;Left Wrist 01/02/23  0835  Wrist  less than 1   Peripheral IV 01/02/23 20 G Left Antecubital 01/02/23  0850  Antecubital  less than 1   Urethral Catheter Breyonna Craft Straight-tip 14 Fr. 01/02/23  1228  Straight-tip  less than 1   External Urinary Catheter 01/02/23  0947  --  less than 1            Intake/Output Last 24 hours  Intake/Output Summary (Last 24 hours) at 01/02/2023 1813 Last data filed at 01/02/2023 1554 Gross per 24 hour  Intake --  Output 2400 ml  Net -2400 ml    Labs/Imaging Results for orders placed or performed during the hospital encounter of 01/02/23 (from the past 48 hours)  Brain natriuretic peptide     Status: Abnormal   Collection Time: 01/02/23  8:55 AM  Result Value Ref Range   B Natriuretic Peptide 1,006.1 (H) 0.0 - 100.0 pg/mL    Comment: Performed at [facility], [address] [place], [place] [address]  Comprehensive metabolic panel     Status: Abnormal  Collection Time: 01/02/23  8:55 AM  Result Value Ref Range   Sodium 135 135 - 145 mmol/L   Potassium 4.1 3.5 - 5.1 mmol/L    Comment: HEMOLYSIS AT THIS LEVEL MAY AFFECT RESULT   Chloride 107 98 - 111 mmol/L   CO2 20 (L) 22 - 32 mmol/L   Glucose, Bld 109 (H) 70 - 99 mg/dL    Comment: Glucose reference range applies only to samples taken after fasting for at least 8 hours.   BUN 20 8 - 23 mg/dL   Creatinine, Ser 9.56 (H) 0.44 - 1.00 mg/dL   Calcium 9.0 8.9 - 21.3 mg/dL   Total Protein 6.3 (L) 6.5 - 8.1 g/dL   Albumin 2.8 (L) 3.5 - 5.0 g/dL   AST 31 15 - 41 U/L    Comment: HEMOLYSIS AT THIS LEVEL MAY AFFECT RESULT   ALT 24 0 - 44 U/L    Comment: HEMOLYSIS AT THIS LEVEL MAY AFFECT RESULT   Alkaline Phosphatase 111 38 - 126 U/L   Total Bilirubin 1.4 (H) <1.2 mg/dL    Comment: HEMOLYSIS AT THIS LEVEL MAY AFFECT RESULT   GFR, Estimated 45 (L) >60 mL/min    Comment: (NOTE) Calculated using the  CKD-EPI Creatinine Equation (2021)    Anion gap 8 5 - 15    Comment: Performed at [facility], [address], [place], [place] [address]  Troponin I (High Sensitivity)     Status: None   Collection Time: 01/02/23  8:55 AM  Result Value Ref Range   Troponin I (High Sensitivity) 13 <18 ng/L    Comment: (NOTE) Elevated high sensitivity troponin I (hsTnI) values and significant  changes across serial measurements may suggest ACS but many other  chronic and acute conditions are known to elevate hsTnI results.  Refer to the "Links" section for chest pain algorithms and additional  guidance. Performed at [facility], [address] [place], [place] [address]   CBC with Differential     Status: Abnormal   Collection Time: 01/02/23  8:55 AM  Result Value Ref Range   WBC 9.8 4.0 - 10.5 K/uL   RBC 3.53 (L) 3.87 - 5.11 MIL/uL   Hemoglobin 10.3 (L) 12.0 - 15.0 g/dL   HCT 29.5 (L) 28.4 - 13.2 %   MCV 96.3 80.0 - 100.0 fL   MCH 29.2 26.0 - 34.0 pg   MCHC 30.3 30.0 - 36.0 g/dL   RDW 44.0 (H) 10.2 - 72.5 %   Platelets 261 150 - 400 K/uL   nRBC 0.0 0.0 - 0.2 %   Neutrophils Relative % 80 %   Neutro Abs 7.8 (H) 1.7 - 7.7 K/uL   Lymphocytes Relative 9 %   Lymphs Abs 0.9 0.7 - 4.0 K/uL   Monocytes Relative 8 %   Monocytes Absolute 0.8 0.1 - 1.0 K/uL   Eosinophils Relative 2 %   Eosinophils Absolute 0.2 0.0 - 0.5 K/uL   Basophils Relative 1 %   Basophils Absolute 0.1 0.0 - 0.1 K/uL   Immature Granulocytes 0 %   Abs Immature Granulocytes 0.04 0.00 - 0.07 K/uL    Comment: Performed at [facility], [address], [place], [place] [address]  I-Stat venous blood gas, ED     Status: Abnormal   Collection Time: 01/02/23  9:07 AM  Result Value Ref Range   pH, Ven 7.422 7.25 - 7.43   pCO2, Ven 38.8 (L) 44 - 60 mmHg  pO2, Ven 89 (H) 32 - 45 mmHg   Bicarbonate 25.3 20.0 - 28.0 mmol/L   TCO2 26 22 - 32 mmol/L   O2 Saturation 97 %   Acid-Base Excess 1.0 0.0 - 2.0 mmol/L    Sodium 139 135 - 145 mmol/L   Potassium 4.2 3.5 - 5.1 mmol/L   Calcium, Ion 1.19 1.15 - 1.40 mmol/L   HCT 32.0 (L) 36.0 - 46.0 %   Hemoglobin 10.9 (L) 12.0 - 15.0 g/dL   Sample type VENOUS   SARS Coronavirus 2 by RT PCR (hospital order, performed in [facility] hospital lab) *cepheid single result test* Anterior Nasal Swab     Status: None   Collection Time: 01/02/23  9:13 AM   Specimen: Anterior Nasal Swab  Result Value Ref Range   SARS Coronavirus 2 by RT PCR NEGATIVE NEGATIVE    Comment: Performed at [facility], [address] [place], [place] [address]  Troponin I (High Sensitivity)     Status: None   Collection Time: 01/02/23 10:55 AM  Result Value Ref Range   Troponin I (High Sensitivity) 16 <18 ng/L    Comment: (NOTE) Elevated high sensitivity troponin I (hsTnI) values and significant  changes across serial measurements may suggest ACS but many other  chronic and acute conditions are known to elevate hsTnI results.  Refer to the "Links" section for chest pain algorithms and additional  guidance. Performed at [facility], [address], [place], [place] [address]   Procalcitonin     Status: None   Collection Time: 01/02/23 10:55 AM  Result Value Ref Range   Procalcitonin <0.10 ng/mL    Comment:        Interpretation: PCT (Procalcitonin) <= 0.5 ng/mL: Systemic infection (sepsis) is not likely. Local bacterial infection is possible. (NOTE)       Sepsis PCT Algorithm           Lower Respiratory Tract                                      Infection PCT Algorithm    ----------------------------     ----------------------------         PCT < 0.25 ng/mL                PCT < 0.10 ng/mL          Strongly encourage             Strongly discourage   discontinuation of antibiotics    initiation of antibiotics    ----------------------------     -----------------------------       PCT 0.25 - 0.50 ng/mL            PCT 0.10 - 0.25 ng/mL               OR       >80%  decrease in PCT            Discourage initiation of                                            antibiotics      Encourage discontinuation           of antibiotics    ----------------------------     -----------------------------  PCT >= 0.50 ng/mL              PCT 0.26 - 0.50 ng/mL               AND        <80% decrease in PCT             Encourage initiation of                                             antibiotics       Encourage continuation           of antibiotics    ----------------------------     -----------------------------        PCT >= 0.50 ng/mL                  PCT > 0.50 ng/mL               AND         increase in PCT                  Strongly encourage                                      initiation of antibiotics    Strongly encourage escalation           of antibiotics                                     -----------------------------                                           PCT <= 0.25 ng/mL                                                 OR                                        > 80% decrease in PCT                                      Discontinue / Do not initiate                                             antibiotics  Performed at [facility], [address], [place], [place] [address]   C-reactive protein     Status: Abnormal   Collection Time: 01/02/23 10:55 AM  Result Value Ref Range   CRP 6.6 (H) <1.0 mg/dL    Comment: Performed at [facility], [address] [place], [place] [address]   DG Chest [place]  1 View Result Date: 01/02/2023 CLINICAL DATA:  Shortness of breath. EXAM: PORTABLE CHEST 1 VIEW COMPARISON:  10/25/2022. FINDINGS: Low lung volume. Mild pulmonary vascular congestion, which may be accentuated by low lung volume. Redemonstration of left retrocardiac airspace opacity obscuring the left hemidiaphragm, descending thoracic aorta and blunting the left lateral costophrenic angle suggesting combination of left lower lobe  atelectasis and/or consolidation with pleural effusion. No significant interval change. Bilateral lung fields are otherwise clear. There is subtle blunting of right lateral costophrenic angle, which may represent trace right pleural effusion versus overlying soft tissue. Stable cardio-mediastinal silhouette. No acute osseous abnormalities. The soft tissues are within normal limits. IMPRESSION: *Mild pulmonary vascular congestion, which may be accentuated by low lung volume. *Left retrocardiac opacity, as described above. Electronically Signed   By: Jules Schick M.D.   On: 01/02/2023 09:14    Pending Labs Unresulted Labs (From admission, onward)     Start     Ordered   01/03/23 0500  Basic metabolic panel  Daily,   R      01/02/23 1259   01/02/23 1101  Respiratory (~20 pathogens) panel by PCR  (Respiratory panel by PCR (~20 pathogens, ~24 hr TAT)  w precautions)  Once,   R        01/02/23 1101            Vitals/Pain Today's Vitals   01/02/23 1545 01/02/23 1615 01/02/23 1715 01/02/23 1745  BP: (!) 170/67 (!) 155/62 (!) 169/64 (!) 162/61  Pulse: (!) 54 (!) 52 (!) 54 (!) 55  Resp: (!) 26 (!) 21 20 (!) 26  Temp:      TempSrc:      SpO2: 100% 100% 100% 100%  Weight:      Height:      PainSc:        Isolation Precautions Droplet precaution  Medications Medications  amiodarone (PACERONE) tablet 200 mg (200 mg Oral Not Given 01/02/23 1214)  ALPRAZolam (XANAX) tablet 0.5 mg (has no administration in time range)  empagliflozin (JARDIANCE) tablet 10 mg (10 mg Oral Not Given 01/02/23 1215)  apixaban (ELIQUIS) tablet 5 mg (5 mg Oral Not Given 01/02/23 1215)  tiZANidine (ZANAFLEX) tablet 2 mg (has no administration in time range)  sodium chloride flush (NS) 0.9 % injection 3 mL (3 mLs Intravenous Given 01/02/23 1313)  acetaminophen (TYLENOL) tablet 650 mg (has no administration in time range)    Or  acetaminophen (TYLENOL) suppository 650 mg (has no administration in time range)   nitroGLYCERIN 50 mg in dextrose 5 % 250 mL (0.2 mg/mL) infusion (10 mcg/min Intravenous Rate/Dose Change 01/02/23 1748)  albuterol (PROVENTIL) (2.5 MG/3ML) 0.083% nebulizer solution 2.5 mg (has no administration in time range)  metoprolol tartrate (LOPRESSOR) tablet 100 mg (has no administration in time range)  irbesartan (AVAPRO) tablet 75 mg (75 mg Oral Given 01/02/23 1735)  hydrALAZINE (APRESOLINE) tablet 25 mg (25 mg Oral Given 01/02/23 1735)  furosemide (LASIX) injection 40 mg (40 mg Intravenous Given 01/02/23 0939)    Mobility walks     Focused Assessments Pulmonary Assessment Handoff:  Lung sounds: Bilateral Breath Sounds: Diminished O2 Device: (S) Nasal Cannula O2 Flow Rate (L/min): 4 L/min    R Recommendations: See Admitting Provider Note  Report given to:   Additional Notes: Pt is AOX4, walky talky, weaning down 02 from 6L, was on bipap, weaning off nitroglycerin as well, emptied urine earlier

## 2023-01-02 NOTE — ED Notes (Addendum)
Removed Bipap, placed pt on 6L per Robb Matar, MD request.

## 2023-01-02 NOTE — Progress Notes (Signed)
RT removed pt from BiPAP and left on room air at 1047. RT stayed at bedside to assess how pt would do on room air. Due to accessory muscle usage and tachypneia, RT placed pt back on BiPAP at 1102. While off BiPAP pts vital signs maintained normal. Pt is currently back on BiPAP at this time and tolerating well. RN notified.

## 2023-01-02 NOTE — ED Notes (Signed)
Patient left the floor in stable condition, AOX4, with her belongings, and staff.

## 2023-01-02 NOTE — Progress Notes (Signed)
Attempted Echocardiogram, patient being transported to another floor.

## 2023-01-02 NOTE — Progress Notes (Signed)
RT assisted with transport of this pt from ED32 to ED43 while on BiPAP (servo). Pt tolerated well with SVS and no complications.

## 2023-01-02 NOTE — H&P (Addendum)
History and Physical    Patient: Brenda Matthews WJX:[id number] DOB: May 30, 1935 DOA: 01/02/2023 DOS: the patient was seen and examined on 01/02/2023 PCP: Jeoffrey Massed, MD  Patient coming from: Home Medical readiness/disposition: Anticipate will be ready for discharge on 01/05/2023  Chief Complaint:  Chief Complaint  Patient presents with   Shortness of Breath   HPI: [name] is a 87 y.o. female with medical history significant of atrial fibrillation rate controlled on amiodarone and anticoagulated with Eliquis, chronic diastolic heart failure, chronic kidney disease stage III on Jardiance, dyslipidemia, hypertension, recent issues with lower extremity nodules treated with antibiotics by PCP (but likely will eventually require biopsy per PCP note).  Patient reports that yesterday evening she began having trouble breathing that got worse through the night.  She was unable to sleep.  She denies lower extremity edema.  She has been taking her antihypertensive and diuretic medications as ordered.  Chest x-ray in the ED consistent with mild pulmonary vascular congestion.  BNP markedly elevated for this patient at 1006 with previous BNP in November was 176.  Patient has required BiPAP since arrival with an FiO2 of 40%.  Initial blood pressure was 188/86 and has decreased to 167 after she was given IV Lasix 40 mg x 1.  She is maintaining sinus bradycardia in the 50s in the context of known amiodarone usage.  PCR was negative for COVID.  In addition to above history patient denies any chest pain, dyspnea on exertion prior to acute onset of symptoms last night, weight gain, fevers or chills or cough.  Hospitalist service has been asked to evaluate the patient for admission   Review of Systems: As mentioned in the history of present illness. All other systems reviewed and are negative.   Past Medical History:  Diagnosis Date   Abnormal EKG approx 2008   Nuclear stress test neg;    Acute  pancreatitis 09/2020   idiopathic.  +panc psudocyst   Anxiety    with panic   CAP (community acquired pneumonia) 08/2017   Hospitalization for CAP/acute diast HF/rapid a-fib   Cataract    s/p surgery--lens implants   Chronic diastolic heart failure (HCC) 2019   Chronic renal insufficiency, stage 3 (moderate) (HCC) 12/04/2012   Renal u/s when in hosp 08/2017 for CAP/CHF showed symmetric kidneys, echogenicity normal, w/out hydronephrosis. Baseline GFR around 40 ml/min as of 10/2018.   DDD (degenerative disc disease), lumbar    Diverticulosis 2009   Fracture of radial shaft, left, closed 11/16/2010   fell down flight of stairs   History of kidney stones    Hx of adenomatous colonic polyps 2002;2009;2015   surveillance colonoscopy 2009, +polypectomy done-tubular adenoma w/out high grade.  05/2013 tubular adenomas--recall 3 yrs   Hyperlipidemia    Hypertension    Low TSH level 02/18/2016   T3 norm, T4 mildly elevated--suspected sick euthyroid syndrome.  Repeat labs 06/2016: normal.   Melanoma in situ (HCC) 06/2018   L LL   Osteoarthritis of both knees    viscosupplementation injections helpful 2020/21   Osteopenia    DEXA 08/2010; repeat DEXA 02/2015 worse: fosamax started.  06/2018 Dexa T score -2.4.  2020 maj osteop fx risk = 24%, Hip fx risk 7.3%. 09/2020 T score -2.1   PAF (paroxysmal atrial fibrillation) (HCC) 02/2016   2018.   DCCV x 2 2024.   Peripheral edema    Pneumonia 2015   hx with sepsis   Rheumatic fever    Past Surgical  History:  Procedure Laterality Date   APPENDECTOMY  1966   done during surgery for tubal pregnancy   CARDIOVERSION N/A 06/17/2022   Procedure: CARDIOVERSION;  Surgeon: Jodelle Red, MD;  Location: [facility] INVASIVE CV LAB;  Service: Cardiovascular;  Laterality: N/A;   CARDIOVERSION N/A 07/11/2022   Procedure: CARDIOVERSION;  Surgeon: Dolores Patty, MD;  Location: [facility] INVASIVE CV LAB;  Service: Cardiovascular;  Laterality: N/A;   CATARACT  EXTRACTION W/ INTRAOCULAR LENS IMPLANT  2013   bilat   COLONOSCOPY W/ POLYPECTOMY  05/2013   +diverticulosis; recall 3 yrs (Dr. Marina Goodell)   DEXA  02/2015; 06/2018   T score -2.1 in both femoral necks; FRAX 10 yr risk of major osteoporotic fracture was 21%---fosamax started. 06/2018 T score -2.4.  T score 09/2020 -2.1. Rpt 2 yrs.   ECTOPIC PREGNANCY SURGERY     EYE SURGERY     LEFT HEART CATH AND CORONARY ANGIOGRAPHY N/A 09/01/2017   No angiographically significant CAD.  Upper normal left ventricular filling pressure.  Procedure: LEFT HEART CATH AND CORONARY ANGIOGRAPHY;  Surgeon: Yvonne Kendall, MD;  Location: [facility] INVASIVE CV LAB;  Service: Cardiovascular;  Laterality: N/A;   LUMBAR LAMINECTOMY/DECOMPRESSION MICRODISCECTOMY N/A 09/26/2018   Procedure: Decompressive Lumbar Laminectomy L5 S1 FORAMINOTOMY L5 S1  NERVE ROOT BILATERALLY and Microdiscectomy L5-S1 Left;  Surgeon: Ranee Gosselin, MD;  Location: [facility] ORS;  Service: Orthopedics;  Laterality: N/A;    OPEN REDUCTION INTERNAL FIXATION (ORIF) TIBIA/FIBULA FRACTURE Left 02/18/2016   Procedure: OPEN REDUCTION INTERNAL FIXATION (ORIF) Right ankle trimalleolar fracture;  Surgeon: Toni Arthurs, MD;  Location: [facility] OR;  Service: Orthopedics;  Laterality: Left;  requests   ORIF RADIAL FRACTURE  11/18/2010   left; s/p slip on slippery floor and fell   THORACENTESIS  08/2017   diagnostic and therapeutic.  Transudative.  Clx neg.  (+pulm edema/diastolic HF)   TONSILLECTOMY     TRANSTHORACIC ECHOCARDIOGRAM  02/18/2016; 08/10/17   LVEF of 55-60%, mild AI and mild MR and normal biatrial size.  07/2017--normal LV function, mild enlarge aortic root, mild/mod TR, bilat atrial enlargement. 06/09/22 EF 50-55%, mod MR, aortic root stable enlgmt   Social History:  reports that she has never smoked. She has never used smokeless tobacco. She reports current alcohol use. She reports that she does not use drugs.  Allergies  Allergen Reactions   Augmentin  [Amoxicillin-Pot Clavulanate] Nausea And Vomiting and Other (See Comments)    "projectile vomiting" Has patient had a PCN reaction causing immediate rash, facial/tongue/throat swelling, SOB or lightheadedness with hypotension:No Has patient had a PCN reaction causing severe rash involving mucus membranes or skin necrosis:No Has patient had a PCN reaction that required hospitalization:No Has patient had a PCN reaction occurring within the last 10 years:Yes If all of the above answers are "NO", then may proceed with Cephalosporin use.    Amoxicillin Rash   Clindamycin/Lincomycin Rash    Family History  Problem Relation Age of Onset   Heart disease Mother    Heart disease Father    Hypertension Brother    Diabetes Sister    Colon cancer Neg Hx    Pancreatic cancer Neg Hx    Rectal cancer Neg Hx    Stomach cancer Neg Hx     Prior to Admission medications   Medication Sig Start Date End Date Taking? Authorizing Provider  acetaminophen (TYLENOL) 650 MG CR tablet Take 1,300 mg by mouth daily.    [provider]  albuterol (VENTOLIN HFA) 108 (90 Base)  MCG/ACT inhaler INHALE 2 PUFFS BY MOUTH into THE lungs EVERY 4 HOURS AS NEEDED FOR WHEEZING OR SHORTNESS OF BREATH Patient taking differently: Inhale 2 puffs into the lungs every 4 (four) hours as needed for shortness of breath. 10/12/22   McGowen, Maryjean Morn, MD  ALPRAZolam Prudy Feeler) 0.5 MG tablet Take 1 tablet (0.5 mg total) by mouth 3 (three) times daily as needed for anxiety. TAKE 1 TABLET BY MOUTH 3 TIMES DAILY AS NEEDED FOR ANXIETY Patient taking differently: Take 0.5 mg by mouth 3 (three) times daily as needed for anxiety. 10/12/22   McGowen, Maryjean Morn, MD  amiodarone (PACERONE) 200 MG tablet Take 1 tablet (200 mg total) by mouth daily. 08/04/22   Ronney Asters, NP  apixaban (ELIQUIS) 5 MG TABS tablet Take 1 tablet (5 mg total) by mouth 2 (two) times daily. 10/11/22   Hilty, Lisette Abu, MD  Apoaequorin (PREVAGEN PO) Take 1 tablet by  mouth daily.    [provider]  Ascorbic Acid (VITAMIN C PO) Take by mouth daily.    [provider]  atorvastatin (LIPITOR) 40 MG tablet TAKE 1 TABLET BY MOUTH EVERY DAY 11/02/22   McGowen, Maryjean Morn, MD  Biotin 5000 MCG TABS Take 5,000 mcg by mouth daily.    [provider]  Calcium Carbonate (CALCIUM 600 PO) Take 1,200 mg by mouth daily.     [provider]  carboxymethylcellulose (REFRESH PLUS) 0.5 % SOLN Place 2 drops into both eyes daily as needed (dry/irritated eyes.).    [provider]  Coenzyme Q10 200 MG capsule Take 200 mg by mouth daily.    [provider]  fluticasone (FLONASE) 50 MCG/ACT nasal spray Place 2 sprays into both nostrils daily.     [provider]  furosemide (LASIX) 20 MG tablet Take 1 tablet (20 mg total) by mouth daily. 08/04/22   Ronney Asters, NP  hydrALAZINE (APRESOLINE) 100 MG tablet Take 1 tablet (100 mg total) by mouth 2 (two) times daily. Patient taking differently: Take 100 mg by mouth 3 (three) times daily. 09/01/22   McGowen, Maryjean Morn, MD  hydrocortisone 2.5 % cream APPLY DAILY AS NEEDED TO RASH ON FEET AND LEGS 12/27/22   Jodelle Gross, NP  hydrocortisone cream 1 % Apply 1 Application topically as needed for itching.    [provider]  JARDIANCE 10 MG TABS tablet TAKE 1 TABLET BY MOUTH EVERY DAY BEFORE BREAKFAST 11/16/22   Alver Sorrow, NP  metoprolol tartrate (LOPRESSOR) 100 MG tablet TAKE 1 TABLET BY MOUTH 2 TIMES DAILY 12/22/22   Hilty, Lisette Abu, MD  Multiple Vitamins-Minerals (CENTRUM SILVER ULTRA WOMENS) TABS Take 1 tablet by mouth every evening.    [provider]  potassium chloride SA (KLOR-CON M) 20 MEQ tablet Take 1 tablet (20 mEq total) by mouth daily. 08/04/22   Ronney Asters, NP  tiZANidine (ZANAFLEX) 2 MG tablet Take 1 tablet (2 mg total) by mouth every 8 (eight) hours as needed for muscle spasms. 11/04/22   Maretta Bees, PA    Physical  Exam: Vitals:   01/02/23 1015 01/02/23 1045 01/02/23 1047 01/02/23 1102  BP: (!) 167/68 (!) 169/69    Pulse: (!) 50 (!) 54  (!) 53  Resp: (!) 24 (!) 25  (!) 28  Temp:      TempSrc:      SpO2: 100% 100% 93% 100%  Weight:      Height:       Constitutional:  NAD, calm, uncomfortable reporting feeling the need to void but unable to do so Respiratory: BiPAP with 40% FiO2.  Posterior exam reveals diffuse crackles throughout. Normal respiratory effort. No accessory muscle use.  Cardiovascular: Regular rate and rhythm, no murmurs / rubs / gallops. No extremity edema. 2+ pedal pulses.  Given BiPAP mask and patient's advanced age with short neck/loose skin around neck unable to appreciate if any JVD Abdomen: no tenderness, no masses palpated. No obvious hepatosplenomegaly. Bowel sounds positive.  Musculoskeletal: no clubbing / cyanosis. No joint deformity upper and lower extremities. Good ROM, no contractures. Normal muscle tone.  Skin: no rashes, does have several areas consistent with known prior nodules which are now flat nontender on her lower extremities, ulcers. No induration Neurologic: CN 2-12 grossly intact. Sensation intact, DTR normal. Strength 4/5 x all 4 extremities.  Psychiatric: Normal judgment and insight.  Drowsy but oriented x 3. Normal mood.     Data Reviewed:  Initial labs: Sodium 135, potassium 4.1, CO2 20, glucose 109, BUN 20, creatinine 1.19, anion gap 8, albumin 2.8, total protein 6.3, total bilirubin 1.4  BNP 1600 with troponin 13, EKG unremarkable except for sinus bradycardia  ESR was 60 on 12/5 in the context of extremity nodules being treated with doxycycline which patient has completed  COVID PCR negative  Chest x-ray as above  Assessment and Plan: Acute hypoxemic respiratory failure likely due to exacerbation of heart failure Patient currently requiring BiPAP-wean as tolerated Etiology: HFpEF vs flash pulmonary edema vs viral etiology Please note BNP markedly  higher than previous baseline so we will need to repeat echocardiogram to ensure no changes.  Previous EF was preserved at 50 to 55%, concentrated LVH and patient had mild to moderate MVR Patient was on low-dose Lasix of only 20 mg daily at home.  Given her decreased GFR this dose may not be enough to maintain euvolemia Will give Lasix 40 mg IV every 12 hours and follow electrolytes Daily weight and strict I's/O Initiate IV nitroglycerin infusion-hold home hydralazine Continue home beta-blocker-given current bradycardia will decrease dose from 100 to 50 mg twice daily  Atrial fibrillation Continue amiodarone and Eliquis Maintain potassium greater than 4 Maintain normal magnesium level  CKD 3B Continue Jardiance Follow creatinine closely during diuresis  Acute urinary retention Patient unable to void since arrival-pure wick in place with no returns Bladder scan revealed about 730 cc Insert Foley catheter-maintain while diuresing  Hypertension Medications as above  Dyslipidemia Resume statin once off BiPAP    Advance Care Planning:   Code Status: Full Code --discussed with patient at bedside and she confirmed  VTE prophylaxis: Eliquis  Consults: None  Family Communication: Patient only  Severity of Illness: The appropriate patient status for this patient is INPATIENT. Inpatient status is judged to be reasonable and necessary in order to provide the required intensity of service to ensure the patient's safety. The patient's presenting symptoms, physical exam findings, and initial radiographic and laboratory data in the context of their chronic comorbidities is felt to place them at high risk for further clinical deterioration. Furthermore, it is not anticipated that the patient will be medically stable for discharge from the hospital within 2 midnights of admission.   * I certify that at the point of admission it is my clinical judgment that the patient will require inpatient  hospital care spanning beyond 2 midnights from the point of admission due to high intensity of service, high risk for further deterioration and high frequency of surveillance required.*  Author: Junious Silk, NP 01/02/2023 11:27 AM  For on call review [email or web address].

## 2023-01-02 NOTE — Progress Notes (Signed)
   01/02/23 2020  BiPAP/CPAP/SIPAP  BiPAP/CPAP/SIPAP Pt Type Adult  BiPAP/CPAP/SIPAP SERVO  BiPAP/CPAP /SiPAP Vitals  Pulse Rate (!) 56  SpO2 96 %      Pt has PRN Bipap orders, no distress noted at this time

## 2023-01-02 NOTE — ED Provider Notes (Signed)
[place] EMERGENCY DEPARTMENT AT [facility] Provider Note   CSN: [id number] Arrival date & time: 01/02/23  0831     History  Chief Complaint  Patient presents with   Shortness of Breath    Brenda Matthews is a 87 y.o. female.  Patient is an 87 year old female with a past medical history of hypertension, CHF, A-fib on Eliquis presenting to the emergency department with shortness of breath.  The patient states that she started to feel short of breath yesterday and her breathing acutely worsened at 2 AM.  She states that she has had a mild nonproductive cough.  She denies any chest pain but states that it feels hard to breathe.  She denies any fevers.  She states that she has had lower extremity swelling but was recently diagnosed also with erythema nodosum and has been wearing her compression stockings.  She reports she has been taking her medications as prescribed.  On EMS arrival she was found to be hypertensive over 200s and was given 2 sublingual nitro for her blood pressure and work of breathing.  Patient reported mild improvement of her symptoms with blood pressure management.  The history is provided by the patient.  Shortness of Breath      Home Medications Prior to Admission medications   Medication Sig Start Date End Date Taking? Authorizing Provider  acetaminophen (TYLENOL) 650 MG CR tablet Take 1,300 mg by mouth daily.    [provider]  albuterol (VENTOLIN HFA) 108 (90 Base) MCG/ACT inhaler INHALE 2 PUFFS BY MOUTH into THE lungs EVERY 4 HOURS AS NEEDED FOR WHEEZING OR SHORTNESS OF BREATH Patient taking differently: Inhale 2 puffs into the lungs every 4 (four) hours as needed for shortness of breath. 10/12/22   McGowen, Maryjean Morn, MD  ALPRAZolam Prudy Feeler) 0.5 MG tablet Take 1 tablet (0.5 mg total) by mouth 3 (three) times daily as needed for anxiety. TAKE 1 TABLET BY MOUTH 3 TIMES DAILY AS NEEDED FOR ANXIETY Patient taking differently: Take 0.5 mg by mouth 3  (three) times daily as needed for anxiety. 10/12/22   McGowen, Maryjean Morn, MD  amiodarone (PACERONE) 200 MG tablet Take 1 tablet (200 mg total) by mouth daily. 08/04/22   Ronney Asters, NP  apixaban (ELIQUIS) 5 MG TABS tablet Take 1 tablet (5 mg total) by mouth 2 (two) times daily. 10/11/22   Hilty, Lisette Abu, MD  Apoaequorin (PREVAGEN PO) Take 1 tablet by mouth daily.    [provider]  Ascorbic Acid (VITAMIN C PO) Take by mouth daily.    [provider]  atorvastatin (LIPITOR) 40 MG tablet TAKE 1 TABLET BY MOUTH EVERY DAY 11/02/22   McGowen, Maryjean Morn, MD  Biotin 5000 MCG TABS Take 5,000 mcg by mouth daily.    [provider]  Calcium Carbonate (CALCIUM 600 PO) Take 1,200 mg by mouth daily.     [provider]  carboxymethylcellulose (REFRESH PLUS) 0.5 % SOLN Place 2 drops into both eyes daily as needed (dry/irritated eyes.).    [provider]  Coenzyme Q10 200 MG capsule Take 200 mg by mouth daily.    [provider]  fluticasone (FLONASE) 50 MCG/ACT nasal spray Place 2 sprays into both nostrils daily.     [provider]  furosemide (LASIX) 20 MG tablet Take 1 tablet (20 mg total) by mouth daily. 08/04/22   Ronney Asters, NP  hydrALAZINE (APRESOLINE) 100 MG tablet Take 1 tablet (100 mg total) by mouth  2 (two) times daily. Patient taking differently: Take 100 mg by mouth 3 (three) times daily. 09/01/22   McGowen, Maryjean Morn, MD  hydrocortisone 2.5 % cream APPLY DAILY AS NEEDED TO RASH ON FEET AND LEGS 12/27/22   Jodelle Gross, NP  hydrocortisone cream 1 % Apply 1 Application topically as needed for itching.    [provider]  JARDIANCE 10 MG TABS tablet TAKE 1 TABLET BY MOUTH EVERY DAY BEFORE BREAKFAST 11/16/22   Alver Sorrow, NP  metoprolol tartrate (LOPRESSOR) 100 MG tablet TAKE 1 TABLET BY MOUTH 2 TIMES DAILY 12/22/22   Hilty, Lisette Abu, MD  Multiple Vitamins-Minerals (CENTRUM SILVER ULTRA WOMENS) TABS Take 1  tablet by mouth every evening.    [provider]  potassium chloride SA (KLOR-CON M) 20 MEQ tablet Take 1 tablet (20 mEq total) by mouth daily. 08/04/22   Ronney Asters, NP  tiZANidine (ZANAFLEX) 2 MG tablet Take 1 tablet (2 mg total) by mouth every 8 (eight) hours as needed for muscle spasms. 11/04/22   Crain, Alphonzo Lemmings L, PA      Allergies    Augmentin [amoxicillin-pot clavulanate], Amoxicillin, and Clindamycin/lincomycin    Review of Systems   Review of Systems  Respiratory:  Positive for shortness of breath.     Physical Exam Updated Vital Signs BP (!) 160/62   Pulse (!) 51   Temp 98 F (36.7 C) (Oral)   Resp 20   Ht 5\' 1"  (1.549 m)   Wt 77.1 kg   LMP  (LMP Unknown)   SpO2 100%   BMI 32.12 kg/m  Physical Exam Vitals and nursing note reviewed.  Constitutional:      Appearance: She is well-developed. She is ill-appearing. She is not diaphoretic.  HENT:     Head: Normocephalic and atraumatic.     Mouth/Throat:     Mouth: Mucous membranes are moist.  Eyes:     Extraocular Movements: Extraocular movements intact.  Cardiovascular:     Rate and Rhythm: Normal rate and regular rhythm.  Pulmonary:     Effort: Tachypnea and accessory muscle usage present.     Breath sounds: Examination of the right-lower field reveals rhonchi. Examination of the left-lower field reveals rhonchi. Rhonchi present.     Comments: Speaking in 3-4 word sentences Abdominal:     Palpations: Abdomen is soft.     Tenderness: There is no abdominal tenderness.  Musculoskeletal:        General: Normal range of motion.     Cervical back: Normal range of motion and neck supple.     Right lower leg: Edema (2+ to knee) present.     Left lower leg: Edema (2+ to knee) present.  Skin:    General: Skin is warm and dry.  Neurological:     General: No focal deficit present.     Mental Status: She is alert and oriented to person, place, and time.  Psychiatric:        Mood and Affect: Mood normal.         Behavior: Behavior normal.     ED Results / Procedures / Treatments   Labs (all labs ordered are listed, but only abnormal results are displayed) Labs Reviewed  BRAIN NATRIURETIC PEPTIDE - Abnormal; Notable for the following components:      Result Value   B Natriuretic Peptide 1,006.1 (*)    All other components within normal limits  COMPREHENSIVE METABOLIC PANEL - Abnormal; Notable for the following components:  CO2 20 (*)    Glucose, Bld 109 (*)    Creatinine, Ser 1.19 (*)    Total Protein 6.3 (*)    Albumin 2.8 (*)    Total Bilirubin 1.4 (*)    GFR, Estimated 45 (*)    All other components within normal limits  CBC WITH DIFFERENTIAL/PLATELET - Abnormal; Notable for the following components:   RBC 3.53 (*)    Hemoglobin 10.3 (*)    HCT 34.0 (*)    RDW 15.9 (*)    Neutro Abs 7.8 (*)    All other components within normal limits  C-REACTIVE PROTEIN - Abnormal; Notable for the following components:   CRP 6.6 (*)    All other components within normal limits  I-STAT VENOUS BLOOD GAS, ED - Abnormal; Notable for the following components:   pCO2, Ven 38.8 (*)    pO2, Ven 89 (*)    HCT 32.0 (*)    Hemoglobin 10.9 (*)    All other components within normal limits  SARS CORONAVIRUS 2 BY RT PCR  RESPIRATORY PANEL BY PCR  PROCALCITONIN  TROPONIN I (HIGH SENSITIVITY)  TROPONIN I (HIGH SENSITIVITY)    EKG EKG Interpretation Date/Time:  Monday January 02 2023 09:08:46 EST Ventricular Rate:  54 PR Interval:  57 QRS Duration:  105 QT Interval:  518 QTC Calculation: 491 R Axis:   -32  Text Interpretation: Sinus rhythm Short PR interval Left axis deviation Low voltage, precordial leads Consider anterior infarct No significant change since last tracing Confirmed by Elayne Snare ([id number]) on 01/02/2023 9:09:28 AM  Radiology DG Chest Port 1 View Result Date: 01/02/2023 CLINICAL DATA:  Shortness of breath. EXAM: PORTABLE CHEST 1 VIEW COMPARISON:  10/25/2022. FINDINGS:  Low lung volume. Mild pulmonary vascular congestion, which may be accentuated by low lung volume. Redemonstration of left retrocardiac airspace opacity obscuring the left hemidiaphragm, descending thoracic aorta and blunting the left lateral costophrenic angle suggesting combination of left lower lobe atelectasis and/or consolidation with pleural effusion. No significant interval change. Bilateral lung fields are otherwise clear. There is subtle blunting of right lateral costophrenic angle, which may represent trace right pleural effusion versus overlying soft tissue. Stable cardio-mediastinal silhouette. No acute osseous abnormalities. The soft tissues are within normal limits. IMPRESSION: *Mild pulmonary vascular congestion, which may be accentuated by low lung volume. *Left retrocardiac opacity, as described above. Electronically Signed   By: Jules Schick M.D.   On: 01/02/2023 09:14    Procedures .Critical Care  Performed by: Rexford Maus, DO Authorized by: Rexford Maus, DO   Critical care provider statement:    Critical care time (minutes):  30   Critical care was necessary to treat or prevent imminent or life-threatening deterioration of the following conditions:  Respiratory failure   Critical care was time spent personally by me on the following activities:  Development of treatment plan with patient or surrogate, discussions with consultants, evaluation of patient's response to treatment, examination of patient, ordering and review of laboratory studies, ordering and review of radiographic studies, ordering and performing treatments and interventions, pulse oximetry, re-evaluation of patient's condition and review of old charts     Medications Ordered in ED Medications  amiodarone (PACERONE) tablet 200 mg (200 mg Oral Not Given 01/02/23 1214)  ALPRAZolam (XANAX) tablet 0.5 mg (has no administration in time range)  empagliflozin (JARDIANCE) tablet 10 mg (10 mg Oral Not  Given 01/02/23 1215)  apixaban (ELIQUIS) tablet 5 mg (5 mg Oral Not Given 01/02/23 1215)  tiZANidine (ZANAFLEX) tablet 2 mg (has no administration in time range)  furosemide (LASIX) injection 40 mg (40 mg Intravenous Given 01/02/23 1104)  sodium chloride flush (NS) 0.9 % injection 3 mL (3 mLs Intravenous Given 01/02/23 1313)  acetaminophen (TYLENOL) tablet 650 mg (has no administration in time range)    Or  acetaminophen (TYLENOL) suppository 650 mg (has no administration in time range)  nitroGLYCERIN 50 mg in dextrose 5 % 250 mL (0.2 mg/mL) infusion (10 mcg/min Intravenous New Bag/Given 01/02/23 1312)  albuterol (PROVENTIL) (2.5 MG/3ML) 0.083% nebulizer solution 2.5 mg (has no administration in time range)  metoprolol tartrate (LOPRESSOR) tablet 100 mg (has no administration in time range)  furosemide (LASIX) injection 40 mg (40 mg Intravenous Given 01/02/23 0939)    ED Course/ Medical Decision Making/ A&P Clinical Course as of 01/02/23 1501  Mon Jan 02, 2023  1028 BNP >1000 consistent with CHF exacerbation. Patient's BP improved with lasix and on bipap. Will be admitted for further management. [VK]    Clinical Course User Index [VK] Rexford Maus, DO                                 Medical Decision Making This patient presents to the ED with chief complaint(s) of shortness of breath with pertinent past medical history of CHF, a fib on Eliquis, HTN which further complicates the presenting complaint. The complaint involves an extensive differential diagnosis and also carries with it a high risk of complications and morbidity.    The differential diagnosis includes CHF exacerbation, flash pulmonary edema, ACS, arrhythmia, anemia, pneumonia, pneumothorax, viral syndrome  Additional history obtained: Additional history obtained from EMS  Records reviewed Primary Care Documents and outpatient cardiology records  ED Course and Reassessment: On patient's arrival she is  hypertensive, tachypneic with accessory muscle use.  She does have crackles at the bases and pitting edema concerning for CHF exacerbation.  With her increased work of breathing, respiratory was called to be initiated on BiPAP.  Patient will be started on IV Lasix.  The patient will have EKG and labs including troponin and BNP and chest x-ray performed and will be closely reassessed.  Independent labs interpretation:  The following labs were independently interpreted: elevated BNP, otherwise within normal range  Independent visualization of imaging: - I independently visualized the following imaging with scope of interpretation limited to determining acute life threatening conditions related to emergency care: CXR, which revealed pulmonary edema  Consultation: - Consulted or discussed management/test interpretation w/ external professional: hospitalist  Consideration for admission or further workup: patient requires admission for flash pulmonary edema Social Determinants of health: N/A    Amount and/or Complexity of Data Reviewed Labs: ordered. Radiology: ordered.  Risk Prescription drug management. Decision regarding hospitalization.          Final Clinical Impression(s) / ED Diagnoses Final diagnoses:  Acute on chronic congestive heart failure, unspecified heart failure type [facility]  Respiratory distress    Rx / DC Orders ED Discharge Orders     None         Rexford Maus, DO 01/02/23 1501

## 2023-01-02 NOTE — ED Notes (Signed)
Delay in PO meds due to pt being placed back on Bipap.

## 2023-01-02 NOTE — Progress Notes (Signed)
Pt arrived to

## 2023-01-02 NOTE — Progress Notes (Signed)
RN called RT stating MD requested pt be removed from BiPAP. RN comfort to take pt off BiPAP. RT at bedside to assess pt. Pt currently on 4L [place] and tolerating well with SVS. BiPAP is at bedside.

## 2023-01-02 NOTE — ED Notes (Signed)
Placed on 3L [place], titrating down.

## 2023-01-03 ENCOUNTER — Inpatient Hospital Stay (HOSPITAL_COMMUNITY): Payer: Medicare PPO

## 2023-01-03 DIAGNOSIS — I5031 Acute diastolic (congestive) heart failure: Secondary | ICD-10-CM

## 2023-01-03 DIAGNOSIS — J810 Acute pulmonary edema: Secondary | ICD-10-CM | POA: Insufficient documentation

## 2023-01-03 DIAGNOSIS — I480 Paroxysmal atrial fibrillation: Secondary | ICD-10-CM

## 2023-01-03 DIAGNOSIS — I509 Heart failure, unspecified: Secondary | ICD-10-CM | POA: Diagnosis not present

## 2023-01-03 DIAGNOSIS — I161 Hypertensive emergency: Secondary | ICD-10-CM

## 2023-01-03 DIAGNOSIS — J9601 Acute respiratory failure with hypoxia: Secondary | ICD-10-CM | POA: Diagnosis not present

## 2023-01-03 DIAGNOSIS — R338 Other retention of urine: Secondary | ICD-10-CM

## 2023-01-03 LAB — BASIC METABOLIC PANEL
Anion gap: 7 (ref 5–15)
BUN: 24 mg/dL — ABNORMAL HIGH (ref 8–23)
CO2: 26 mmol/L (ref 22–32)
Calcium: 8.8 mg/dL — ABNORMAL LOW (ref 8.9–10.3)
Chloride: 106 mmol/L (ref 98–111)
Creatinine, Ser: 1.25 mg/dL — ABNORMAL HIGH (ref 0.44–1.00)
GFR, Estimated: 42 mL/min — ABNORMAL LOW (ref >60)
Glucose, Bld: 102 mg/dL — ABNORMAL HIGH (ref 70–99)
Potassium: 3.2 mmol/L — ABNORMAL LOW (ref 3.5–5.1)
Sodium: 139 mmol/L (ref 135–145)

## 2023-01-03 LAB — ECHOCARDIOGRAM COMPLETE
Area-P 1/2: 3.67 cm2
Calc EF: 64.6 %
Height: 61 in
S' Lateral: 3.3 cm
Single Plane A2C EF: 67.7 %
Single Plane A4C EF: 63.3 %
Weight: 2720 [oz_av]

## 2023-01-03 MED ORDER — CHLORHEXIDINE GLUCONATE CLOTH 2 % EX PADS
6.0000 | MEDICATED_PAD | Freq: Every day | CUTANEOUS | Status: DC
  Administered 2023-01-03 – 2023-01-13 (×7): 6 via TOPICAL

## 2023-01-03 MED ORDER — POTASSIUM CHLORIDE CRYS ER 20 MEQ PO TBCR: 20.0000 meq | EXTENDED_RELEASE_TABLET | Freq: Two times a day (BID) | ORAL | Status: DC

## 2023-01-03 MED ORDER — PERFLUTREN LIPID MICROSPHERE
1.0000 mL | INTRAVENOUS | Status: AC | PRN
  Administered 2023-01-03: 2 mL via INTRAVENOUS

## 2023-01-03 MED ORDER — FUROSEMIDE 10 MG/ML IJ SOLN
40.0000 mg | Freq: Two times a day (BID) | INTRAMUSCULAR | Status: AC
  Administered 2023-01-03 (×2): 40 mg via INTRAVENOUS
  Filled 2023-01-03 (×2): qty 4

## 2023-01-03 MED ORDER — ONDANSETRON HCL 4 MG/2ML IJ SOLN
4.0000 mg | Freq: Four times a day (QID) | INTRAMUSCULAR | Status: DC | PRN
  Administered 2023-01-03 – 2023-01-05 (×2): 4 mg via INTRAVENOUS
  Filled 2023-01-03 (×2): qty 2

## 2023-01-03 MED ORDER — POTASSIUM CHLORIDE CRYS ER 20 MEQ PO TBCR
40.0000 meq | EXTENDED_RELEASE_TABLET | Freq: Two times a day (BID) | ORAL | Status: AC
  Administered 2023-01-03 (×2): 40 meq via ORAL
  Filled 2023-01-03 (×2): qty 2

## 2023-01-03 NOTE — Progress Notes (Signed)
Paroxysmal atrial fibrillation: Patient's heart rate is 50.  Holding metoprolol fall tonight.  Continue amiodarone.  Continue Eliquis

## 2023-01-03 NOTE — TOC Initial Note (Signed)
Transition of Care [facility]) - Initial/Assessment Note    Patient Details  Name: Brenda Matthews MRN: [id number] Date of Birth: [birth year]  [facility] CM/SW Contact:    Michaela Corner, LCSWA Phone Number: 01/03/2023, 3:56 PM  Clinical Narrative:    Pt is from [facility], CSW called and confirmed w/ facility that pt is from ILF. CSW met pt in room to discuss PT recs for SNF. Pt is agreeable at this time and wants to go to [facility].  CSW left VM for [facility] admissions to inquire about sending a referral.            Expected Discharge Plan: Skilled Nursing Facility Barriers to Discharge: Continued Medical Work up   Patient Goals and CMS Choice Patient states their goals for this hospitalization and ongoing recovery are:: to go home          Expected Discharge Plan and Services In-house Referral: Clinical Social Work     Living arrangements for the past 2 months: Marketing executive ([facility])                                      Prior Living Arrangements/Services Living arrangements for the past 2 months: Marketing executive ([facility]) Lives with:: Facility Resident Patient language and need for interpreter reviewed:: Yes Do you feel safe going back to the place where you live?: Yes      Need for Family Participation in Patient Care: No (Comment) Care giver support system in place?: Yes (comment)   Criminal Activity/Legal Involvement Pertinent to Current Situation/Hospitalization: No - Comment as needed  Activities of Daily Living   ADL Screening (condition at time of admission) Independently performs ADLs?: Yes (appropriate for developmental age) Is the patient deaf or have difficulty hearing?: No Does the patient have difficulty seeing, even when wearing glasses/contacts?: No Does the patient have difficulty concentrating, remembering, or making decisions?: No  Permission Sought/Granted Permission  sought to share information with : Facility Industrial/product designer granted to share information with : Yes, Verbal Permission Granted     Permission granted to share info w AGENCY: [facility]        Emotional Assessment Appearance:: Appears stated age Attitude/Demeanor/Rapport: Engaged Affect (typically observed): Calm, Pleasant, Happy Orientation: : Oriented to Self, Oriented to Place, Oriented to  Time, Oriented to Situation Alcohol / Substance Use: Not Applicable Psych Involvement: No (comment)  Admission diagnosis:  Respiratory distress [R06.03] Acute respiratory failure with hypoxemia (HCC) [J96.01] Acute on chronic congestive heart failure, unspecified heart failure type (HCC) [I50.9] Patient Active Problem List   Diagnosis Date Noted   Hypertensive emergency 01/03/2023   Acute pulmonary edema (HCC) 01/03/2023   Acute urinary retention 01/03/2023   Acute respiratory failure with hypoxemia (HCC) 01/02/2023   High risk medication use 11/07/2022   Acute bilateral low back pain without sciatica 11/07/2022   ARF (acute renal failure) (HCC) 10/23/2022   Acute renal failure superimposed on stage 3b chronic kidney disease (HCC) 10/22/2022   Chronic heart failure with preserved ejection fraction (HFpEF) (HCC) 10/22/2022   Elevated transaminase level 10/22/2022   Physical deconditioning 06/14/2022   Acute on chronic diastolic (congestive) heart failure (HCC) 06/11/2022   Idiopathic acute pancreatitis without necrosis or infection 09/14/2020   Pancreatic cyst 09/14/2020   Pancreatic duct dilated 09/14/2020   Pure hypercholesterolemia 09/14/2020   Hypertensive heart and chronic kidney disease with  heart failure and stage 1 through stage 4 chronic kidney disease, or unspecified chronic kidney disease (HCC) 09/28/2018   Spinal stenosis, lumbar region with neurogenic claudication 09/26/2018   Osteoarthritis of right knee 11/21/2017   Acute on chronic diastolic CHF  (congestive heart failure) (HCC)    S/P thoracentesis    Acute respiratory failure (HCC)    Rash in adult 08/30/2017   Atrial fibrillation and flutter (HCC)    DOE (dyspnea on exertion) 08/29/2017   Arterial hypotension    Chronic anticoagulation 12/19/2016   Low TSH level 02/19/2016   PAF (paroxysmal atrial fibrillation) (HCC) 02/18/2016   Ankle fracture, left-s/p surgery 02/18/16 02/18/2016   Closed displaced trimalleolar fracture of left ankle 02/18/2016   Acute bronchitis 02/10/2015   Bronchopneumonia 12/02/2013   Chronic kidney disease, stage 3b (HCC) 12/04/2012   Hx of adenomatous colonic polyps    Routine general medical examination at a health care facility 09/05/2011   Cervical cancer screening 02/24/2011   SCIATICA, BILATERAL 09/01/2009   Sprain of sacroiliac region 09/01/2009   ANEMIA 11/07/2007   Osteopenia of the elderly 11/07/2007   INSOMNIA 11/07/2007   EDEMA 08/14/2007   Hyperlipidemia 04/04/2007   PANIC DISORDER 04/04/2007   Essential hypertension 04/04/2007   Anxiety state 12/27/2006   Arthropathy 12/27/2006   PCP:  Jeoffrey Massed, MD Pharmacy:   [facility] - [place], [place] - [address] [place] [address] Phone: [phone] Fax: [phone]  [facility] - [place], [place] - [address] [place] [address] Phone: [phone] Fax: [phone]     Social Drivers of Health (SDOH) Social History: SDOH Screenings   Food Insecurity: No Food Insecurity (01/02/2023)  Housing: Low Risk  (01/02/2023)  Transportation Needs: No Transportation Needs (01/02/2023)  Utilities: Not At Risk (01/02/2023)  Alcohol Screen: Low Risk  (10/11/2020)  Depression (PHQ2-9): Low Risk  (09/01/2022)  Financial Resource Strain: Low Risk  (10/27/2021)  Physical Activity: Sufficiently Active (10/27/2021)  Social Connections: Moderately Integrated (10/27/2021)  Stress: No Stress Concern Present  (10/27/2021)  Tobacco Use: Low Risk  (01/02/2023)   SDOH Interventions:     Readmission Risk Interventions    10/24/2022    9:08 AM  Readmission Risk Prevention Plan  Post Dischage Appt Complete  Medication Screening Complete  Transportation Screening Complete

## 2023-01-03 NOTE — Progress Notes (Signed)
Heart Failure Navigator Progress Note  Assessed for Heart & Vascular TOC clinic readiness.  Patient does not meet criteria due to Chronic EF 50-55%, has a scheduled [facility] appointment on 01/24/2023.    Navigator will sign off at this time.   Rhae Hammock, BSN, Scientist, clinical (histocompatibility and immunogenetics) Only

## 2023-01-03 NOTE — Evaluation (Addendum)
Physical Therapy Evaluation Patient Details Name: Brenda Matthews MRN: [id number] DOB: 04/08/1935 Today's Date: 01/03/2023  History of Present Illness  Brenda Matthews is an 87 y.o. female admitted for lower extremity swelling and SOB 12/16. Chest x-ray was consistent with mild pulmonary edema PMH: chronic afib on amiodarone and Eliquis, chronic diastolic heart failure, CKD stage III, essential hypertension recently treated with antibiotics for her lower extremity nodule by PCP   Clinical Impression  Pt admitted with above. PTA pt resided in ILF at [facility], [place] without AD in apt but used rollator in community, drove self to grocery store, and was indep with ADLs. Pt presenting with generalized weakness requiring use of RW for safe ambulation at this time, impaired balance requiring minA for lower body dressing, and is currently requiring supplemental O2. SpO2 at 86% on RA, 90% on 2Lo2 via [place]. Recommend OT eval to address ADLs as she currently requires assist and was indep PTA. Pt to benefit from inpatient rehab program < 3 hrs a day to achieve safe mod I level of function as she was PTA. Acute PT to cont to follow.      If plan is discharge home, recommend the following: A little help with walking and/or transfers;A little help with bathing/dressing/bathroom   Can travel by private vehicle   Yes    Equipment Recommendations  (Has rollator at home)  Recommendations for Other Services  OT consult    Functional Status Assessment Patient has had a recent decline in their functional status and demonstrates the ability to make significant improvements in function in a reasonable and predictable amount of time.     Precautions / Restrictions Precautions Precautions: Fall Restrictions Weight Bearing Restrictions Per Provider Order: No      Mobility  Bed Mobility               General bed mobility comments: pt received sitting up in chair    Transfers Overall transfer level:  Needs assistance Equipment used: Rolling walker (2 wheels) Transfers: Sit to/from Stand Sit to Stand: Min assist           General transfer comment: pt with good push up from arm rests of chair, minA to stabilize, pt attempted to pull up underwear however had posterior LOB requiring minA to prevent fall backwards    Ambulation/Gait Ambulation/Gait assistance: Contact guard assist Gait Distance (Feet): 120 Feet Assistive device: Rolling walker (2 wheels) Gait Pattern/deviations: Step-through pattern, Decreased stride length, Trunk flexed Gait velocity: dec Gait velocity interpretation: <1.31 ft/sec, indicative of household ambulator   General Gait Details: pt kyphotic and would benefit from youth RW, pt reports she uses rollator in the community but no AD in her apartment, pt wtih good walker management, SpO2 at 90% on 2lO2 via [place]  Stairs            Wheelchair Mobility     Tilt Bed    Modified Rankin (Stroke Patients Only)       Balance Overall balance assessment: Needs assistance Sitting-balance support: Feet supported, No upper extremity supported Sitting balance-Leahy Scale: Fair     Standing balance support: No upper extremity supported, During functional activity Standing balance-Leahy Scale: Poor Standing balance comment: LOB when pulling underwear up in standing                             Pertinent Vitals/Pain Pain Assessment Pain Assessment: No/denies pain    Home Living  Family/patient expects to be discharged to:: Assisted living                 Home Equipment: Shower seat;Cane - single point;Rollator (4 wheels) Additional Comments: ILF at [facility]    Prior Function               Mobility Comments: uses rollator in community, drives self to grocery store but [facility] drives her to/from MD appt ADLs Comments: does own dressing and bathing     Extremity/Trunk Assessment   Upper Extremity Assessment Upper  Extremity Assessment: Generalized weakness (limited bilat shld flex)    Lower Extremity Assessment Lower Extremity Assessment: Generalized weakness    Cervical / Trunk Assessment Cervical / Trunk Assessment: Kyphotic  Communication   Communication Communication: No apparent difficulties  Cognition Arousal: Alert Behavior During Therapy: WFL for tasks assessed/performed Overall Cognitive Status: Within Functional Limits for tasks assessed                                 General Comments: pt very focused on returning to ILF apartment        General Comments General comments (skin integrity, edema, etc.): pt with bilat ted hose on, noted L shin nodule, SpO2 86% on RA, 90% on 2LO2    Exercises     Assessment/Plan    PT Assessment Patient needs continued PT services  PT Problem List Decreased strength;Decreased activity tolerance;Decreased balance;Decreased mobility;Decreased coordination       PT Treatment Interventions DME instruction;Gait training;Functional mobility training;Therapeutic activities;Therapeutic exercise;Balance training    PT Goals (Current goals can be found in the Care Plan section)  Acute Rehab PT Goals Patient Stated Goal: back to ILF PT Goal Formulation: With patient Time For Goal Achievement: 01/17/23 Potential to Achieve Goals: Good    Frequency Min 1X/week     Co-evaluation               AM-PAC PT "6 Clicks" Mobility  Outcome Measure Help needed turning from your back to your side while in a flat bed without using bedrails?: A Little Help needed moving from lying on your back to sitting on the side of a flat bed without using bedrails?: A Little Help needed moving to and from a bed to a chair (including a wheelchair)?: A Little Help needed standing up from a chair using your arms (e.g., wheelchair or bedside chair)?: A Little Help needed to walk in hospital room?: A Little Help needed climbing 3-5 steps with a railing?  : A Lot 6 Click Score: 17    End of Session Equipment Utilized During Treatment: Gait belt;Oxygen Activity Tolerance: Patient tolerated treatment well Patient left: in chair;with call bell/phone within reach Nurse Communication: Mobility status PT Visit Diagnosis: Unsteadiness on feet (R26.81);Muscle weakness (generalized) (M62.81)    Time: 3220-2542 PT Time Calculation (min) (ACUTE ONLY): 33 min   Charges:   PT Evaluation $PT Eval Moderate Complexity: 1 Mod PT Treatments $Gait Training: 8-22 mins PT General Charges $$ ACUTE PT VISIT: 1 Visit         Lewis Shock, PT, DPT Acute Rehabilitation Services Secure chat preferred Office #: [phone]   Iona Hansen 01/03/2023, 2:45 PM

## 2023-01-03 NOTE — Progress Notes (Signed)
Metoprolol held, HR in 50s. MD on call notified.  Ok to hold metoprolol per Janalyn Shy, MD.

## 2023-01-03 NOTE — Progress Notes (Signed)
[facility] PROGRESS NOTE    Progress Note  Brenda Matthews  NWG:[id number] DOB: 1935/06/19 DOA: 01/02/2023 PCP: Jeoffrey Massed, MD     Brief Narrative:   Brenda Matthews is an 87 y.o. female past medical history of chronic atrial fibrillation on amiodarone and Eliquis, chronic diastolic heart failure, chronic kidney disease stage III, essential hypertension recently treated with antibiotics for her lower extremity nodule by PCP comes in for lower extremity swelling and shortness of breath that started on the night of admission. Chest x-ray was consistent with mild pulmonary edema, BNP in the thousands and  systolic blood pressure in the 200  Assessment/Plan:   Hypertensive emergency/ Essential hypertension: Was on BiPAP in the ED tachypneic. Estimated dry weight is around 77 kg, this morning she is 76, grams. Initially started on IV nitroglycerin, now has been weaned off. Blood pressure improved, now has been transition to hydralazine Avapro and oral metoprolol. Will continue to titrate antihypertensive medications. Consult PT OT. Try to wean to room air.  Acute respiratory failure with hypoxemia (HCC)/acute pulmonary edema in the setting of acute diastolic dysfunction: She is lower than her estimated weight yes she is negative about 4 L and her creatinine has remained stable. He is euvolemic on physical exam. On admission chest x-ray showed pulmonary edema and she appeared definitely fluid overloaded on physical exam with a BNP of 1000. Continue IV Lasix twice a day, monitor electrolytes and replete as needed. Negative about 3.8 L.  Her weight is coming down. Check basic metabolic panel in the morning keep a close eye on the creatinine. Continue strict I's and O's and daily weights. Continue metoprolol, Avapro Lasix and hydralazine. 2D echo is pending.  Hypokalemia: Likely due to diuresis repeat recheck in the morning.  PAF (paroxysmal atrial fibrillation) (HCC) Rate  controlled continue metoprolol, amiodarone and Eliquis. Try to keep potassium greater than 4 magnesium greater than 2.  Acute urinary retention Foley in place.   DVT prophylaxis: Eliquis Family Communication:none Status is: Inpatient Remains inpatient appropriate because: Hypertensive emergency    Code Status:     Code Status Orders  (From admission, onward)           Start     Ordered   01/02/23 1058  Full code  Continuous       Question:  By:  Answer:  Other   01/02/23 1058           Code Status History     Date Active Date Inactive Code Status Order ID Comments User Context   10/22/2022 2246 10/26/2022 1957 Full Code [id number]  Briscoe Deutscher, MD Inpatient   06/11/2022 1225 06/24/2022 1916 Full Code [id number]  Maryln Gottron, MD ED   09/14/2020 0945 09/20/2020 0204 Full Code [id number]  Marlow Baars, MD Inpatient   09/26/2018 1310 09/27/2018 1831 Full Code [id number]  Ranee Gosselin, MD Inpatient   08/29/2017 1438 09/11/2017 2152 Full Code [id number]  Gwenyth Bender, NP ED   02/18/2016 1952 02/20/2016 2005 Full Code [id number]  Toni Arthurs, MD Inpatient         IV Access:   Peripheral IV   Procedures and diagnostic studies:   DG Chest Port 1 View Result Date: 01/02/2023 CLINICAL DATA:  Shortness of breath. EXAM: PORTABLE CHEST 1 VIEW COMPARISON:  10/25/2022. FINDINGS: Low lung volume. Mild pulmonary vascular congestion, which may be accentuated by low lung volume. Redemonstration of left retrocardiac airspace opacity obscuring the left hemidiaphragm, descending thoracic aorta and blunting  the left lateral costophrenic angle suggesting combination of left lower lobe atelectasis and/or consolidation with pleural effusion. No significant interval change. Bilateral lung fields are otherwise clear. There is subtle blunting of right lateral costophrenic angle, which may represent trace right pleural effusion versus overlying soft tissue. Stable cardio-mediastinal  silhouette. No acute osseous abnormalities. The soft tissues are within normal limits. IMPRESSION: *Mild pulmonary vascular congestion, which may be accentuated by low lung volume. *Left retrocardiac opacity, as described above. Electronically Signed   By: Jules Schick M.D.   On: 01/02/2023 09:14     Medical Consultants:   None.   Subjective:    ANNET REHLING relates her breathing is a lot better.  Objective:    Vitals:   01/03/23 0434 01/03/23 0440 01/03/23 0444 01/03/23 0638  BP: (!) 141/64   (!) 146/66  Pulse: 66 64 66   Resp: 18 (!) 31 (!) 25   Temp: 98 F (36.7 C)     TempSrc: Oral     SpO2: 96% 95% 95%   Weight: 76.1 kg     Height:       SpO2: 95 % O2 Flow Rate (L/min): 2 L/min FiO2 (%): 40 %   Intake/Output Summary (Last 24 hours) at 01/03/2023 0727 Last data filed at 01/03/2023 0400 Gross per 24 hour  Intake 258.9 ml  Output 4150 ml  Net -3891.1 ml   Filed Weights   01/02/23 0845 01/03/23 0434  Weight: 77.1 kg 76.1 kg    Exam: General exam: In no acute distress. Respiratory system: Good air movement and clear to auscultation. Cardiovascular system: S1 & S2 heard, RRR.  Can not appreciate any JVD. Gastrointestinal system: Abdomen is nondistended, soft and nontender.  Extremities: No pedal edema. Skin: No rashes, lesions or ulcers Psychiatry: Judgement and insight appear normal. Mood & affect appropriate.    Data Reviewed:    Labs: Basic Metabolic Panel: Recent Labs  Lab 01/02/23 0855 01/02/23 0907 01/03/23 0302  NA 135 139 139  K 4.1 4.2 3.2*  CL 107  --  106  CO2 20*  --  26  GLUCOSE 109*  --  102*  BUN 20  --  24*  CREATININE 1.19*  --  1.25*  CALCIUM 9.0  --  8.8*   GFR Estimated Creatinine Clearance: 30.7 mL/min (A) (by C-G formula based on SCr of 1.25 mg/dL (H)). Liver Function Tests: Recent Labs  Lab 01/02/23 0855  AST 31  ALT 24  ALKPHOS 111  BILITOT 1.4*  PROT 6.3*  ALBUMIN 2.8*   No results for input(s):  "LIPASE", "AMYLASE" in the last 168 hours. No results for input(s): "AMMONIA" in the last 168 hours. Coagulation profile No results for input(s): "INR", "PROTIME" in the last 168 hours. COVID-19 Labs  Recent Labs    01/02/23 1055  CRP 6.6*    Lab Results  Component Value Date   SARSCOV2NAA NEGATIVE 01/02/2023   SARSCOV2NAA NEGATIVE 10/22/2022   SARSCOV2NAA NEGATIVE 09/19/2020   SARSCOV2NAA NEGATIVE 09/14/2020    CBC: Recent Labs  Lab 01/02/23 0855 01/02/23 0907  WBC 9.8  --   NEUTROABS 7.8*  --   HGB 10.3* 10.9*  HCT 34.0* 32.0*  MCV 96.3  --   PLT 261  --    Cardiac Enzymes: No results for input(s): "CKTOTAL", "CKMB", "CKMBINDEX", "TROPONINI" in the last 168 hours. BNP (last 3 results) No results for input(s): "PROBNP" in the last 8760 hours. CBG: No results for input(s): "GLUCAP" in the last 168  hours. D-Dimer: No results for input(s): "DDIMER" in the last 72 hours. Hgb A1c: No results for input(s): "HGBA1C" in the last 72 hours. Lipid Profile: No results for input(s): "CHOL", "HDL", "LDLCALC", "TRIG", "CHOLHDL", "LDLDIRECT" in the last 72 hours. Thyroid function studies: No results for input(s): "TSH", "T4TOTAL", "T3FREE", "THYROIDAB" in the last 72 hours.  Invalid input(s): "FREET3" Anemia work up: No results for input(s): "VITAMINB12", "FOLATE", "FERRITIN", "TIBC", "IRON", "RETICCTPCT" in the last 72 hours. Sepsis Labs: Recent Labs  Lab 01/02/23 0855 01/02/23 1055  PROCALCITON  --  <0.10  WBC 9.8  --    Microbiology Recent Results (from the past 240 hours)  SARS Coronavirus 2 by RT PCR (hospital order, performed in [facility] hospital lab) *cepheid single result test* Anterior Nasal Swab     Status: None   Collection Time: 01/02/23  9:13 AM   Specimen: Anterior Nasal Swab  Result Value Ref Range Status   SARS Coronavirus 2 by RT PCR NEGATIVE NEGATIVE Final    Comment: Performed at [facility], [address], [place], [place] [address]   Respiratory (~20 pathogens) panel by PCR     Status: None   Collection Time: 01/02/23  5:36 PM   Specimen: Nasopharyngeal Swab; Respiratory  Result Value Ref Range Status   Adenovirus NOT DETECTED NOT DETECTED Final   Coronavirus 229E NOT DETECTED NOT DETECTED Final    Comment: (NOTE) The Coronavirus on the Respiratory Panel, DOES NOT test for the novel  Coronavirus (2019 nCoV)    Coronavirus HKU1 NOT DETECTED NOT DETECTED Final   Coronavirus NL63 NOT DETECTED NOT DETECTED Final   Coronavirus OC43 NOT DETECTED NOT DETECTED Final   Metapneumovirus NOT DETECTED NOT DETECTED Final   Rhinovirus / Enterovirus NOT DETECTED NOT DETECTED Final   Influenza A NOT DETECTED NOT DETECTED Final   Influenza B NOT DETECTED NOT DETECTED Final   Parainfluenza Virus 1 NOT DETECTED NOT DETECTED Final   Parainfluenza Virus 2 NOT DETECTED NOT DETECTED Final   Parainfluenza Virus 3 NOT DETECTED NOT DETECTED Final   Parainfluenza Virus 4 NOT DETECTED NOT DETECTED Final   Respiratory Syncytial Virus NOT DETECTED NOT DETECTED Final   Bordetella pertussis NOT DETECTED NOT DETECTED Final   Bordetella Parapertussis NOT DETECTED NOT DETECTED Final   Chlamydophila pneumoniae NOT DETECTED NOT DETECTED Final   Mycoplasma pneumoniae NOT DETECTED NOT DETECTED Final    Comment: Performed at [facility], [address], [place], [place] [address]     Medications:    amiodarone  200 mg Oral Daily   apixaban  5 mg Oral BID   empagliflozin  10 mg Oral Daily   hydrALAZINE  25 mg Oral Q6H   irbesartan  75 mg Oral Daily   metoprolol tartrate  100 mg Oral BID   potassium chloride  20 mEq Oral BID   sodium chloride flush  3 mL Intravenous Q12H   Continuous Infusions:  nitroGLYCERIN Stopped (01/02/23 1953)      LOS: 1 day   Marinda Elk  [facility]  01/03/2023, 7:27 AM

## 2023-01-03 NOTE — Evaluation (Signed)
Occupational Therapy Evaluation Patient Details Name: Brenda Matthews MRN: [id number] DOB: 1935-08-05 Today's Date: 01/03/2023   History of Present Illness Brenda Matthews is an 87 y.o. female admitted for lower extremity swelling and SOB 12/16. Chest x-ray was consistent with mild pulmonary edema PMH: chronic afib on amiodarone and Eliquis, chronic diastolic heart failure, CKD stage III, essential hypertension recently treated with antibiotics for her lower extremity nodule by PCP   Clinical Impression   PTA, pt lived at ILF where she was independent in ADL and most IADL; has someone accompany her to grocery store. Upon eval, pt needing mod cues for posture and head up during functional mobility, CGA for walking in hall and performing oral care at sink. Set-up for donning socks. Pt with generalized weakness and decreased oxygen demand as well as activity tolerance. Patient will benefit from continued inpatient follow up therapy, <3 hours/day        If plan is discharge home, recommend the following: A little help with walking and/or transfers;A little help with bathing/dressing/bathroom;Assistance with cooking/housework;Help with stairs or ramp for entrance;Assist for transportation    Functional Status Assessment  Patient has had a recent decline in their functional status and demonstrates the ability to make significant improvements in function in a reasonable and predictable amount of time.  Equipment Recommendations  None recommended by OT    Recommendations for Other Services       Precautions / Restrictions Precautions Precautions: Fall Restrictions Weight Bearing Restrictions Per Provider Order: No      Mobility Bed Mobility               General bed mobility comments: pt received sitting up in chair    Transfers Overall transfer level: Needs assistance Equipment used: Rolling walker (2 wheels) Transfers: Sit to/from Stand Sit to Stand: Contact guard assist            General transfer comment: for safety      Balance Overall balance assessment: Needs assistance Sitting-balance support: Feet supported, No upper extremity supported Sitting balance-Leahy Scale: Fair     Standing balance support: No upper extremity supported, During functional activity Standing balance-Leahy Scale: Poor                             ADL either performed or assessed with clinical judgement   ADL Overall ADL's : Needs assistance/impaired Eating/Feeding: Set up;Sitting   Grooming: Contact guard assist;Oral care;Standing Grooming Details (indicate cue type and reason): leans on sink; compensatory strategies for opening toothpaste Upper Body Bathing: Set up;Sitting   Lower Body Bathing: Contact guard assist;Sit to/from stand   Upper Body Dressing : Set up;Sitting   Lower Body Dressing: Contact guard assist;Sit to/from stand   Toilet Transfer: Contact guard assist;Rolling walker (2 wheels);Ambulation           Functional mobility during ADLs: Contact guard assist;Rolling walker (2 wheels) General ADL Comments: one bout of LOB when turning around in hall; min A to correct     Vision Ability to See in Adequate Light: 0 Adequate Patient Visual Report: No change from baseline Vision Assessment?: Wears glasses for reading     Perception Perception: Not tested       Praxis Praxis: Not tested       Pertinent Vitals/Pain Pain Assessment Pain Assessment: No/denies pain     Extremity/Trunk Assessment Upper Extremity Assessment Upper Extremity Assessment: Generalized weakness (4-/5 grip, otherwise 4/5)   Lower  Extremity Assessment Lower Extremity Assessment: Defer to PT evaluation   Cervical / Trunk Assessment Cervical / Trunk Assessment: Kyphotic   Communication Communication Communication: No apparent difficulties   Cognition Arousal: Alert Behavior During Therapy: WFL for tasks assessed/performed Overall Cognitive Status:  Within Functional Limits for tasks assessed                                 General Comments: pt very focused on returning to ILF apartment     General Comments  bil TED hose. on 1.5 L supplementary O2 with SpO2 >90    Exercises     Shoulder Instructions      Home Living Family/patient expects to be discharged to:: Assisted living                             Home Equipment: Shower seat;Cane - single point;Rollator (4 wheels)   Additional Comments: ILF at [facility]      Prior Functioning/Environment               Mobility Comments: uses rollator in community, drives self to grocery store but [facility] drives her to/from MD appt ADLs Comments: does own dressing and bathing. has someone who comes for 2 hours/week who accoimpanies her to grocery store. Per pt report has walk in shower with shower seat        OT Problem List: Decreased strength;Decreased activity tolerance;Impaired balance (sitting and/or standing);Cardiopulmonary status limiting activity;Decreased knowledge of use of DME or AE      OT Treatment/Interventions: Self-care/ADL training;Therapeutic exercise;DME and/or AE instruction;Balance training;Patient/family education;Therapeutic activities    OT Goals(Current goals can be found in the care plan section) Acute Rehab OT Goals Patient Stated Goal: get better OT Goal Formulation: With patient Time For Goal Achievement: 01/17/23 Potential to Achieve Goals: Good  OT Frequency: Min 1X/week    Co-evaluation              AM-PAC OT "6 Clicks" Daily Activity     Outcome Measure Help from another person eating meals?: None Help from another person taking care of personal grooming?: A Little Help from another person toileting, which includes using toliet, bedpan, or urinal?: A Little Help from another person bathing (including washing, rinsing, drying)?: A Little Help from another person to put on and taking off regular  upper body clothing?: A Little Help from another person to put on and taking off regular lower body clothing?: A Little 6 Click Score: 19   End of Session Equipment Utilized During Treatment: Gait belt;Rolling walker (2 wheels) Nurse Communication: Mobility status  Activity Tolerance: Patient tolerated treatment well Patient left: in chair;with call bell/phone within reach;with chair alarm set  OT Visit Diagnosis: Unsteadiness on feet (R26.81);Muscle weakness (generalized) (M62.81)                Time: 1610-9604 OT Time Calculation (min): 32 min Charges:  OT General Charges $OT Visit: 1 Visit OT Evaluation $OT Eval Moderate Complexity: 1 Mod OT Treatments $Self Care/Home Management : 8-22 mins  Tyler Deis, OTR/L [facility] Acute Rehabilitation Office: [phone]   Myrla Halsted 01/03/2023, 5:33 PM

## 2023-01-03 NOTE — Progress Notes (Signed)
   01/03/23 2035  BiPAP/CPAP/SIPAP  BiPAP/CPAP/SIPAP Pt Type Adult  BiPAP/CPAP/SIPAP V60  BiPAP/CPAP /SiPAP Vitals  Pulse Rate (!) 58  SpO2 94 %  Bilateral Breath Sounds Clear;Diminished    Pt has PRN BIPAP orders, no distress noted.

## 2023-01-04 DIAGNOSIS — J9601 Acute respiratory failure with hypoxia: Secondary | ICD-10-CM | POA: Diagnosis not present

## 2023-01-04 DIAGNOSIS — I480 Paroxysmal atrial fibrillation: Secondary | ICD-10-CM | POA: Diagnosis not present

## 2023-01-04 DIAGNOSIS — I509 Heart failure, unspecified: Secondary | ICD-10-CM | POA: Diagnosis not present

## 2023-01-04 DIAGNOSIS — J810 Acute pulmonary edema: Secondary | ICD-10-CM | POA: Diagnosis not present

## 2023-01-04 LAB — BASIC METABOLIC PANEL
Anion gap: 9 (ref 5–15)
BUN: 27 mg/dL — ABNORMAL HIGH (ref 8–23)
CO2: 26 mmol/L (ref 22–32)
Calcium: 9 mg/dL (ref 8.9–10.3)
Chloride: 106 mmol/L (ref 98–111)
Creatinine, Ser: 1.49 mg/dL — ABNORMAL HIGH (ref 0.44–1.00)
GFR, Estimated: 34 mL/min — ABNORMAL LOW (ref >60)
Glucose, Bld: 117 mg/dL — ABNORMAL HIGH (ref 70–99)
Potassium: 3.7 mmol/L (ref 3.5–5.1)
Sodium: 141 mmol/L (ref 135–145)

## 2023-01-04 MED ORDER — METOPROLOL TARTRATE 25 MG PO TABS
25.0000 mg | ORAL_TABLET | Freq: Two times a day (BID) | ORAL | Status: DC
  Administered 2023-01-04 – 2023-01-14 (×20): 25 mg via ORAL
  Filled 2023-01-04 (×21): qty 1
  Filled 2023-01-04: qty 2

## 2023-01-04 MED ORDER — FLEET ENEMA RE ENEM
1.0000 | ENEMA | Freq: Once | RECTAL | Status: AC
  Administered 2023-01-05: 1 via RECTAL
  Filled 2023-01-04: qty 1

## 2023-01-04 MED ORDER — POLYETHYLENE GLYCOL 3350 17 G PO PACK
17.0000 g | PACK | Freq: Two times a day (BID) | ORAL | Status: AC
  Administered 2023-01-04 – 2023-01-05 (×3): 17 g via ORAL
  Filled 2023-01-04 (×3): qty 1

## 2023-01-04 MED ORDER — METOPROLOL TARTRATE 50 MG PO TABS
50.0000 mg | ORAL_TABLET | Freq: Two times a day (BID) | ORAL | Status: DC
Start: 2023-01-04 — End: 2023-01-04

## 2023-01-04 NOTE — Consult Note (Signed)
[facility] Liaison Consult Note    01/04/2023  Brenda Matthews [birth year] [id number]  [facility] [[facility]] Consult   Primary Care Provider:  Jeoffrey Massed, MD is with Corinda Gubler at [facility]  Insurance: [facility] Choice PPO  Patient was reviewed for post hospital barriers to care.  Patient working with Mobility staff on rounds, anticipating to transition when medically ready to SNF at [facility] with insurance authorization noed  Patient was screened for hospitalization and on behalf of [facility]  Care Coordination to assess for post hospital community care needs.  Patient is being considered for a skilled nursing facility level of care for post hospital transition. Patient is currently a resident at [facility] as discussed in unit progression meeting.  Plan:  Patient needs for transitional care is to be managed at a skilled nursing facility level of care for rehab.  For questions or referrals, please contact:  Charlesetta Shanks, RN, BSN, CCM [name]  [facility], [facility] Liaison Direct Dial: [phone] or secure chat Email: [email or web address]

## 2023-01-04 NOTE — Progress Notes (Signed)
   01/04/23 2013  BiPAP/CPAP/SIPAP  BiPAP/CPAP/SIPAP Pt Type Adult  BiPAP/CPAP/SIPAP V60  BiPAP/CPAP /SiPAP Vitals  Pulse Rate 66  Resp 20  SpO2 96 %  Bilateral Breath Sounds Clear;Diminished   Pt has PRN CPAP orders, no distress noted at this time

## 2023-01-04 NOTE — [place] FL2 (Signed)
[place] MEDICAID FL2 LEVEL OF CARE FORM     IDENTIFICATION  Patient Name: Brenda Matthews Birthdate: 1935-12-27 Sex: female Admission Date (Current Location): 01/02/2023  [facility] and [place] Number:  Producer, television/film/video and Address:  [facility], [address], [place], [place] [address]      Provider Number: [id number]  Attending Physician Name and Address:  David Stall, Darin Engels, MD  Relative Name and Phone Number:       Current Level of Care: Hospital Recommended Level of Care: Skilled Nursing Facility Prior Approval Number:    Date Approved/Denied:   PASRR Number: [id number]  Discharge Plan: SNF    Current Diagnoses: Patient Active Problem List   Diagnosis Date Noted   Hypertensive emergency 01/03/2023   Acute pulmonary edema (HCC) 01/03/2023   Acute urinary retention 01/03/2023   Acute respiratory failure with hypoxemia (HCC) 01/02/2023   High risk medication use 11/07/2022   Acute bilateral low back pain without sciatica 11/07/2022   ARF (acute renal failure) (HCC) 10/23/2022   Acute renal failure superimposed on stage 3b chronic kidney disease (HCC) 10/22/2022   Chronic heart failure with preserved ejection fraction (HFpEF) (HCC) 10/22/2022   Elevated transaminase level 10/22/2022   Physical deconditioning 06/14/2022   Acute on chronic diastolic (congestive) heart failure (HCC) 06/11/2022   Idiopathic acute pancreatitis without necrosis or infection 09/14/2020   Pancreatic cyst 09/14/2020   Pancreatic duct dilated 09/14/2020   Pure hypercholesterolemia 09/14/2020   Hypertensive heart and chronic kidney disease with heart failure and stage 1 through stage 4 chronic kidney disease, or unspecified chronic kidney disease (HCC) 09/28/2018   Spinal stenosis, lumbar region with neurogenic claudication 09/26/2018   Osteoarthritis of right knee 11/21/2017   Acute on chronic diastolic CHF (congestive heart failure) (HCC)    S/P thoracentesis     Acute respiratory failure (HCC)    Rash in adult 08/30/2017   Atrial fibrillation and flutter (HCC)    DOE (dyspnea on exertion) 08/29/2017   Arterial hypotension    Chronic anticoagulation 12/19/2016   Low TSH level 02/19/2016   PAF (paroxysmal atrial fibrillation) (HCC) 02/18/2016   Ankle fracture, left-s/p surgery 02/18/16 02/18/2016   Closed displaced trimalleolar fracture of left ankle 02/18/2016   Acute bronchitis 02/10/2015   Bronchopneumonia 12/02/2013   Chronic kidney disease, stage 3b (HCC) 12/04/2012   Hx of adenomatous colonic polyps    Routine general medical examination at a health care facility 09/05/2011   Cervical cancer screening 02/24/2011   SCIATICA, BILATERAL 09/01/2009   Sprain of sacroiliac region 09/01/2009   ANEMIA 11/07/2007   Osteopenia of the elderly 11/07/2007   INSOMNIA 11/07/2007   EDEMA 08/14/2007   Hyperlipidemia 04/04/2007   PANIC DISORDER 04/04/2007   Essential hypertension 04/04/2007   Anxiety state 12/27/2006   Arthropathy 12/27/2006    Orientation RESPIRATION BLADDER Height & Weight     Self, Time, Situation, Place  O2 (1L O2 [place]) Continent Weight: 168 lb 3.4 oz (76.3 kg) Height:  5\' 1"  (154.9 cm)  BEHAVIORAL SYMPTOMS/MOOD NEUROLOGICAL BOWEL NUTRITION STATUS      Continent Diet (See dc summary)  AMBULATORY STATUS COMMUNICATION OF NEEDS Skin   Limited Assist Verbally Normal                       Personal Care Assistance Level of Assistance  Bathing, Feeding, Total care, Dressing Bathing Assistance: Limited assistance Feeding assistance: Limited assistance Dressing Assistance: Limited assistance  Functional Limitations Info  Sight, Hearing, Speech Sight Info: Adequate Hearing Info: Adequate Speech Info: Adequate    SPECIAL CARE FACTORS FREQUENCY  PT (By licensed PT), OT (By licensed OT)     PT Frequency: 5x week OT Frequency: 5x week            Contractures Contractures Info: Not present    Additional  Factors Info  Code Status, Allergies Code Status Info: Full Allergies Info: Augmentin (amoxicillin-pot Clavulanate), Amoxicillin, Clindamycin/lincomycin           Current Medications (01/04/2023):  This is the current hospital active medication list Current Facility-Administered Medications  Medication Dose Route Frequency Provider Last Rate Last Admin   acetaminophen (TYLENOL) tablet 650 mg  650 mg Oral Q6H PRN Russella Dar, NP       Or   acetaminophen (TYLENOL) suppository 650 mg  650 mg Rectal Q6H PRN Russella Dar, NP       albuterol (PROVENTIL) (2.5 MG/3ML) 0.083% nebulizer solution 2.5 mg  2.5 mg Inhalation Q4H PRN Marinda Elk, MD       ALPRAZolam Prudy Feeler) tablet 0.5 mg  0.5 mg Oral TID PRN Russella Dar, NP   0.5 mg at 01/03/23 2151   amiodarone (PACERONE) tablet 200 mg  200 mg Oral Daily Russella Dar, NP   200 mg at 01/03/23 0802   apixaban (ELIQUIS) tablet 5 mg  5 mg Oral BID Russella Dar, NP   5 mg at 01/03/23 2152   Chlorhexidine Gluconate Cloth 2 % PADS 6 each  6 each Topical Daily Marinda Elk, MD   6 each at 01/03/23 1059   empagliflozin (JARDIANCE) tablet 10 mg  10 mg Oral Daily Russella Dar, NP   10 mg at 01/03/23 1914   hydrALAZINE (APRESOLINE) tablet 25 mg  25 mg Oral Q6H Marinda Elk, MD   25 mg at 01/04/23 0022   irbesartan (AVAPRO) tablet 75 mg  75 mg Oral Daily Marinda Elk, MD   75 mg at 01/03/23 0803   metoprolol tartrate (LOPRESSOR) tablet 25 mg  25 mg Oral BID Sundil, Subrina, MD   25 mg at 01/04/23 0311   ondansetron (ZOFRAN) injection 4 mg  4 mg Intravenous Q6H PRN Marinda Elk, MD   4 mg at 01/03/23 1340   Oral care mouth rinse  15 mL Mouth Rinse PRN Marinda Elk, MD       polyethylene glycol [facility]) packet 17 g  17 g Oral BID Marinda Elk, MD       sodium chloride flush (NS) 0.9 % injection 3 mL  3 mL Intravenous Q12H Russella Dar, NP   3 mL at 01/03/23 2152    tiZANidine (ZANAFLEX) tablet 2 mg  2 mg Oral Q8H PRN Russella Dar, NP         Discharge Medications: Please see discharge summary for a list of discharge medications.  Relevant Imaging Results:  Relevant Lab Results:   Additional Information SS#[id number] [place]

## 2023-01-04 NOTE — Progress Notes (Signed)
Pt converted to Afib rhythm, HR in 90s. Previous rhythm 1st degree AVB HR in 50s, metoprolol held, due to low HR. Pt asymptomatic, no change in mental status. BP106/62, RR 21, SpO2 94 on 2L O2 via nasal cannula. Janalyn Shy, MD notified.  New order placed.

## 2023-01-04 NOTE — Progress Notes (Addendum)
  Paroxysmal atrial fibrillation with bradycardia -Last night patient's heart rate dropped to 54 and I have instructed the patient's nurse to hold the metoprolol.  Patient is currently on Lopressor 100 mg twice daily and amiodarone 200 mg daily.  Around 3 AM patient heart rate is 90 and having rate controlled atrial fibrillation. - Decreasing the dose of metoprolol 100 mg to 25 mg twice daily starting from tonight 3 AM.  Will continue to monitor heart rate closely to avoid further bradycardia versus tachycardia.  Can titrate the dose of metoprolol as needed if heart rate persistently been elevated above 110. - Continue cardiac monitoring.  Tereasa Coop, MD [facility] 01/04/2023, 3:04 AM

## 2023-01-04 NOTE — Progress Notes (Signed)
Pt converted back to Normal Sinus Rhythm. MD on call notified.

## 2023-01-04 NOTE — TOC Progression Note (Addendum)
Transition of Care [facility]) - Progression Note    Patient Details  Name: Brenda Matthews MRN: [id number] Date of Birth: 1935-11-22  Transition of [facility]) CM/SW Contact  Michaela Corner, [place] Phone Number: 01/04/2023, 8:35 AM  Clinical Narrative:   CSW sent referral to [facility]. CSW left VM for admissions director at [facility].   11:00AM: Per [facility], once Berkley Harvey is obtained pt can return for SNF. CSW started ins auth.  3:30PM: Ins auth pending  Expected Discharge Plan: Skilled Nursing Facility Barriers to Discharge: Continued Medical Work up  Expected Discharge Plan and Services In-house Referral: Clinical Social Work     Living arrangements for the past 2 months: Marketing executive ([facility])                                       Social Determinants of Health (SDOH) Interventions SDOH Screenings   Food Insecurity: No Food Insecurity (01/02/2023)  Housing: Low Risk  (01/02/2023)  Transportation Needs: No Transportation Needs (01/02/2023)  Utilities: Not At Risk (01/02/2023)  Alcohol Screen: Low Risk  (10/11/2020)  Depression (PHQ2-9): Low Risk  (09/01/2022)  Financial Resource Strain: Low Risk  (10/27/2021)  Physical Activity: Sufficiently Active (10/27/2021)  Social Connections: Moderately Integrated (10/27/2021)  Stress: No Stress Concern Present (10/27/2021)  Tobacco Use: Low Risk  (01/02/2023)    Readmission Risk Interventions    10/24/2022    9:08 AM  Readmission Risk Prevention Plan  Post Dischage Appt Complete  Medication Screening Complete  Transportation Screening Complete

## 2023-01-04 NOTE — Progress Notes (Signed)
Pt voided at the [facility]. Post void residual showed of urine in her bladder. MD notified. Order for In and Out catheterization obtained. In and out cath completed at the bedside in room 3E09, by Christean Grief, RN assisted by . Indication Acute urinary retention. Procedure explained to pt and pt agreeable. Peri care completed, bed pad and gown clean. Sterile technique maintained throughout procedure. of clear yellow urine collected. No difficulties, pt tolerated well.

## 2023-01-04 NOTE — Progress Notes (Signed)
Urinary retention: Patient nurse reported that patient has urine output to 200 mL and bladder scan showing urinary retention around 400 mL.  Foley has been removed earlier.  Planning to do In-N-Out catheter first if fails will place in Foley catheter.

## 2023-01-04 NOTE — Progress Notes (Signed)
Mobility Specialist Progress Note:    01/04/23 1134  Mobility  Activity Ambulated with assistance in room;Transferred from bed to chair  Level of Assistance Contact guard assist, steadying assist  Assistive Device Front wheel walker  Distance Ambulated (ft) 10 ft  Activity Response Tolerated well  Mobility Referral Yes  Mobility visit 1 Mobility  Mobility Specialist Start Time (ACUTE ONLY) 1050  Mobility Specialist Stop Time (ACUTE ONLY) 1103  Mobility Specialist Time Calculation (min) (ACUTE ONLY) 13 min   Pt received sitting EOB, agreeable to mobility. Pt declined ambulating in halls d/t stomach pain but agreeable to ambulate around bed to the recliner. No physical assistance needed. Situated in chair w/ call bell and personal belongings in reach. All needs met.  Thompson Grayer Mobility Specialist  Please contact vis Secure Chat or  Rehab Office [phone]

## 2023-01-04 NOTE — Progress Notes (Signed)
[facility] PROGRESS NOTE    Progress Note  Brenda Matthews  WUJ:[id number] DOB: 1935-09-09 DOA: 01/02/2023 PCP: Jeoffrey Massed, MD     Brief Narrative:   Brenda Matthews is an 87 y.o. female past medical history of chronic atrial fibrillation on amiodarone and Eliquis, chronic diastolic heart failure, chronic kidney disease stage III, essential hypertension recently treated with antibiotics for her lower extremity nodule by PCP comes in for lower extremity swelling and shortness of breath that started on the night of admission. Chest x-ray was consistent with mild pulmonary edema, BNP in the thousands and  systolic blood pressure in the 200.  Initially needed BiPAP in the ED started on IV nitroglycerin we were able to wean her off BiPAP.  Assessment/Plan:  Hypertensive emergency/ Essential hypertension: Estimated dry weight  76 kg. Continue hydralazine metoprolol and Avapro. Pressure significantly improved. Dose of metoprolol was decreased due to bradycardia we will continue current dose. PT evaluated the patient recommended skilled nursing facility. Try to wean to room air.  Acute respiratory failure with hypoxemia (HCC)/acute pulmonary edema in the setting of acute diastolic dysfunction: Has been weaned to 1 L of of oxygen. Out of bed to chair continues inspirometry try to wean to room air. Will hold on diuresis for now as creatinine is rising. She is negative about 7 L. Continue strict I's and O's and daily weights. Continue metoprolol, Avapro Lasix at a lower dose and hydralazine. 2D echo shows an EF of 60% grade 1 diastolic dysfunction.  Hypokalemia: Likely due to diuresis repeat recheck in the morning.  PAF (paroxysmal atrial fibrillation) (HCC) Rate controlled continue metoprolol, amiodarone and Eliquis. Try to keep potassium greater than 4 magnesium greater than 2. Became bradycardic overnight agree with lower dose of metoprolol.  Acute urinary retention Foley in  place.   DVT prophylaxis: Eliquis Family Communication:none Status is: Inpatient Remains inpatient appropriate because: Hypertensive emergency    Code Status:     Code Status Orders  (From admission, onward)           Start     Ordered   01/02/23 1058  Full code  Continuous       Question:  By:  Answer:  Other   01/02/23 1058           Code Status History     Date Active Date Inactive Code Status Order ID Comments User Context   10/22/2022 2246 10/26/2022 1957 Full Code [id number]  Briscoe Deutscher, MD Inpatient   06/11/2022 1225 06/24/2022 1916 Full Code [id number]  Maryln Gottron, MD ED   09/14/2020 0945 09/20/2020 0204 Full Code [id number]  Marlow Baars, MD Inpatient   09/26/2018 1310 09/27/2018 1831 Full Code [id number]  Ranee Gosselin, MD Inpatient   08/29/2017 1438 09/11/2017 2152 Full Code [id number]  Gwenyth Bender, NP ED   02/18/2016 1952 02/20/2016 2005 Full Code [id number]  Toni Arthurs, MD Inpatient         IV Access:   Peripheral IV   Procedures and diagnostic studies:   ECHOCARDIOGRAM COMPLETE Result Date: 01/03/2023    ECHOCARDIOGRAM REPORT   Patient Name:   Brenda Matthews Date of Exam: 01/03/2023 Medical Rec #:  [id number]    Height:       61.0 in Accession #:    [id number]   Weight:       167.8 lb Date of Birth:  [birth year]    BSA:          1.753 m  Patient Age:    87 years     BP:           135/58 mmHg Patient Gender: F            HR:           58 bpm. Exam Location:  Inpatient Procedure: 2D Echo, Cardiac Doppler, Color Doppler and Intracardiac            Opacification Agent Indications:    CHF  History:        Patient has prior history of Echocardiogram examinations, most                 recent 06/09/2022. CHF, Arrythmias:Atrial Fibrillation,                 Signs/Symptoms:Dyspnea; Risk Factors:Hypertension.  Sonographer:    Webb Laws Referring Phys: [id number] ALLISON L ELLIS IMPRESSIONS  1. Left ventricular ejection fraction, by estimation, is 60 to 65%. The left  ventricle has normal function. The left ventricle has no regional wall motion abnormalities. There is mild concentric left ventricular hypertrophy. Left ventricular diastolic parameters are consistent with Grade I diastolic dysfunction (impaired relaxation).  2. Right ventricular systolic function is normal. The right ventricular size is normal. There is normal pulmonary artery systolic pressure.  3. Left atrial size was moderately dilated.  4. Right atrial size was moderately dilated.  5. The mitral valve is normal in structure. Trivial mitral valve regurgitation. No evidence of mitral stenosis.  6. The aortic valve is tricuspid. There is mild calcification of the aortic valve. Aortic valve regurgitation is mild. No aortic stenosis is present.  7. Aortic dilatation noted. There is mild dilatation of the ascending aorta, measuring 40 mm.  8. The inferior vena cava is normal in size with <50% respiratory variability, suggesting right atrial pressure of 8 mmHg. FINDINGS  Left Ventricle: Left ventricular ejection fraction, by estimation, is 60 to 65%. The left ventricle has normal function. The left ventricle has no regional wall motion abnormalities. Definity contrast agent was given IV to delineate the left ventricular  endocardial borders. The left ventricular internal cavity size was normal in size. There is mild concentric left ventricular hypertrophy. Left ventricular diastolic parameters are consistent with Grade I diastolic dysfunction (impaired relaxation). Right Ventricle: The right ventricular size is normal. No increase in right ventricular wall thickness. Right ventricular systolic function is normal. There is normal pulmonary artery systolic pressure. The tricuspid regurgitant velocity is 2.57 m/s, and  with an assumed right atrial pressure of 8 mmHg, the estimated right ventricular systolic pressure is 34.4 mmHg. Left Atrium: Left atrial size was moderately dilated. Right Atrium: Right atrial size was  moderately dilated. Pericardium: There is no evidence of pericardial effusion. Mitral Valve: The mitral valve is normal in structure. Trivial mitral valve regurgitation. No evidence of mitral valve stenosis. Tricuspid Valve: The tricuspid valve is normal in structure. Tricuspid valve regurgitation is mild . No evidence of tricuspid stenosis. Aortic Valve: The aortic valve is tricuspid. There is mild calcification of the aortic valve. Aortic valve regurgitation is mild. No aortic stenosis is present. Pulmonic Valve: The pulmonic valve was not well visualized. Pulmonic valve regurgitation is not visualized. No evidence of pulmonic stenosis. Aorta: The aortic root is normal in size and structure and aortic dilatation noted. There is mild dilatation of the ascending aorta, measuring 40 mm. Venous: The inferior vena cava is normal in size with less than 50% respiratory variability, suggesting right atrial pressure of  8 mmHg. IAS/Shunts: No atrial level shunt detected by color flow Doppler.  LEFT VENTRICLE PLAX 2D LVIDd:         4.80 cm      Diastology LVIDs:         3.30 cm      LV e' medial:    7.72 cm/s LV PW:         1.10 cm      LV E/e' medial:  7.9 LV IVS:        1.30 cm      LV e' lateral:   8.70 cm/s LVOT diam:     1.90 cm      LV E/e' lateral: 7.0 LV SV:         60 LV SV Index:   34 LVOT Area:     2.84 cm  LV Volumes (MOD) LV vol d, MOD A2C: 114.0 ml LV vol d, MOD A4C: 123.0 ml LV vol s, MOD A2C: 36.8 ml LV vol s, MOD A4C: 45.1 ml LV SV MOD A2C:     77.2 ml LV SV MOD A4C:     123.0 ml LV SV MOD BP:      76.9 ml RIGHT VENTRICLE RV Basal diam:  4.00 cm RV S prime:     11.70 cm/s TAPSE (M-mode): 2.3 cm LEFT ATRIUM             Index        RIGHT ATRIUM           Index LA diam:        4.90 cm 2.80 cm/m   RA Area:     21.10 cm LA Vol (A2C):   51.6 ml 29.44 ml/m  RA Volume:   59.90 ml  34.17 ml/m LA Vol (A4C):   46.4 ml 26.47 ml/m LA Biplane Vol: 49.1 ml 28.01 ml/m  AORTIC VALVE LVOT Vmax:   104.00 cm/s LVOT  Vmean:  73.300 cm/s LVOT VTI:    0.211 m  AORTA Ao Root diam: 2.90 cm Ao Asc diam:  4.00 cm MITRAL VALVE               TRICUSPID VALVE MV Area (PHT): 3.67 cm    TR Peak grad:   26.4 mmHg MV E velocity: 61.20 cm/s  TR Vmax:        257.00 cm/s MV A velocity: 67.30 cm/s MV E/A ratio:  0.91        SHUNTS                            Systemic VTI:  0.21 m                            Systemic Diam: 1.90 cm Arvilla Meres MD Electronically signed by Arvilla Meres MD Signature Date/Time: 01/03/2023/12:43:42 PM    Final    DG Chest Port 1 View Result Date: 01/02/2023 CLINICAL DATA:  Shortness of breath. EXAM: PORTABLE CHEST 1 VIEW COMPARISON:  10/25/2022. FINDINGS: Low lung volume. Mild pulmonary vascular congestion, which may be accentuated by low lung volume. Redemonstration of left retrocardiac airspace opacity obscuring the left hemidiaphragm, descending thoracic aorta and blunting the left lateral costophrenic angle suggesting combination of left lower lobe atelectasis and/or consolidation with pleural effusion. No significant interval change. Bilateral lung fields are otherwise clear. There is subtle blunting of right lateral costophrenic angle, which may represent  trace right pleural effusion versus overlying soft tissue. Stable cardio-mediastinal silhouette. No acute osseous abnormalities. The soft tissues are within normal limits. IMPRESSION: *Mild pulmonary vascular congestion, which may be accentuated by low lung volume. *Left retrocardiac opacity, as described above. Electronically Signed   By: Jules Schick M.D.   On: 01/02/2023 09:14     Medical Consultants:   None.   Subjective:    Brenda Matthews feels better.  Objective:    Vitals:   01/04/23 0434 01/04/23 0633 01/04/23 0635 01/04/23 0724  BP: 109/79 (!) 121/43  (!) 122/46  Pulse: 92 (!) 58 (!) 58 (!) 54  Resp: (!) 22 (!) 25 20 (!) 21  Temp: (!) 97.5 F (36.4 C)     TempSrc: Oral     SpO2: 93% (!) 86% 96% 99%  Weight: 76.3 kg      Height:       SpO2: 99 % O2 Flow Rate (L/min): 1 L/min FiO2 (%): 40 %   Intake/Output Summary (Last 24 hours) at 01/04/2023 0755 Last data filed at 01/04/2023 0130 Gross per 24 hour  Intake 714 ml  Output 3065 ml  Net -2351 ml   Filed Weights   01/02/23 0845 01/03/23 0434 01/04/23 0434  Weight: 77.1 kg 76.1 kg 76.3 kg    Exam: General exam: In no acute distress. Respiratory system: Good air movement and clear to auscultation. Cardiovascular system: S1 & S2 heard, RRR. No JVD. Gastrointestinal system: Abdomen is nondistended, soft and nontender.  Extremities: No pedal edema. Skin: No rashes, lesions or ulcers Psychiatry: Judgement and insight appear normal. Mood & affect appropriate.  Data Reviewed:    Labs: Basic Metabolic Panel: Recent Labs  Lab 01/02/23 0855 01/02/23 0907 01/03/23 0302 01/04/23 0312  NA 135 139 139 141  K 4.1 4.2 3.2* 3.7  CL 107  --  106 106  CO2 20*  --  26 26  GLUCOSE 109*  --  102* 117*  BUN 20  --  24* 27*  CREATININE 1.19*  --  1.25* 1.49*  CALCIUM 9.0  --  8.8* 9.0   GFR Estimated Creatinine Clearance: 25.8 mL/min (A) (by C-G formula based on SCr of 1.49 mg/dL (H)). Liver Function Tests: Recent Labs  Lab 01/02/23 0855  AST 31  ALT 24  ALKPHOS 111  BILITOT 1.4*  PROT 6.3*  ALBUMIN 2.8*   No results for input(s): "LIPASE", "AMYLASE" in the last 168 hours. No results for input(s): "AMMONIA" in the last 168 hours. Coagulation profile No results for input(s): "INR", "PROTIME" in the last 168 hours. COVID-19 Labs  Recent Labs    01/02/23 1055  CRP 6.6*    Lab Results  Component Value Date   SARSCOV2NAA NEGATIVE 01/02/2023   SARSCOV2NAA NEGATIVE 10/22/2022   SARSCOV2NAA NEGATIVE 09/19/2020   SARSCOV2NAA NEGATIVE 09/14/2020    CBC: Recent Labs  Lab 01/02/23 0855 01/02/23 0907  WBC 9.8  --   NEUTROABS 7.8*  --   HGB 10.3* 10.9*  HCT 34.0* 32.0*  MCV 96.3  --   PLT 261  --    Cardiac Enzymes: No results  for input(s): "CKTOTAL", "CKMB", "CKMBINDEX", "TROPONINI" in the last 168 hours. BNP (last 3 results) No results for input(s): "PROBNP" in the last 8760 hours. CBG: No results for input(s): "GLUCAP" in the last 168 hours. D-Dimer: No results for input(s): "DDIMER" in the last 72 hours. Hgb A1c: No results for input(s): "HGBA1C" in the last 72 hours. Lipid Profile: No results for input(s): "  CHOL", "HDL", "LDLCALC", "TRIG", "CHOLHDL", "LDLDIRECT" in the last 72 hours. Thyroid function studies: No results for input(s): "TSH", "T4TOTAL", "T3FREE", "THYROIDAB" in the last 72 hours.  Invalid input(s): "FREET3" Anemia work up: No results for input(s): "VITAMINB12", "FOLATE", "FERRITIN", "TIBC", "IRON", "RETICCTPCT" in the last 72 hours. Sepsis Labs: Recent Labs  Lab 01/02/23 0855 01/02/23 1055  PROCALCITON  --  <0.10  WBC 9.8  --    Microbiology Recent Results (from the past 240 hours)  SARS Coronavirus 2 by RT PCR (hospital order, performed in [facility] hospital lab) *cepheid single result test* Anterior Nasal Swab     Status: None   Collection Time: 01/02/23  9:13 AM   Specimen: Anterior Nasal Swab  Result Value Ref Range Status   SARS Coronavirus 2 by RT PCR NEGATIVE NEGATIVE Final    Comment: Performed at [facility], [address], [place], [place] [address]  Respiratory (~20 pathogens) panel by PCR     Status: None   Collection Time: 01/02/23  5:36 PM   Specimen: Nasopharyngeal Swab; Respiratory  Result Value Ref Range Status   Adenovirus NOT DETECTED NOT DETECTED Final   Coronavirus 229E NOT DETECTED NOT DETECTED Final    Comment: (NOTE) The Coronavirus on the Respiratory Panel, DOES NOT test for the novel  Coronavirus (2019 nCoV)    Coronavirus HKU1 NOT DETECTED NOT DETECTED Final   Coronavirus NL63 NOT DETECTED NOT DETECTED Final   Coronavirus OC43 NOT DETECTED NOT DETECTED Final   Metapneumovirus NOT DETECTED NOT DETECTED Final   Rhinovirus / Enterovirus  NOT DETECTED NOT DETECTED Final   Influenza A NOT DETECTED NOT DETECTED Final   Influenza B NOT DETECTED NOT DETECTED Final   Parainfluenza Virus 1 NOT DETECTED NOT DETECTED Final   Parainfluenza Virus 2 NOT DETECTED NOT DETECTED Final   Parainfluenza Virus 3 NOT DETECTED NOT DETECTED Final   Parainfluenza Virus 4 NOT DETECTED NOT DETECTED Final   Respiratory Syncytial Virus NOT DETECTED NOT DETECTED Final   Bordetella pertussis NOT DETECTED NOT DETECTED Final   Bordetella Parapertussis NOT DETECTED NOT DETECTED Final   Chlamydophila pneumoniae NOT DETECTED NOT DETECTED Final   Mycoplasma pneumoniae NOT DETECTED NOT DETECTED Final    Comment: Performed at [facility], [address], [place], [place] [address]     Medications:    amiodarone  200 mg Oral Daily   apixaban  5 mg Oral BID   Chlorhexidine Gluconate Cloth  6 each Topical Daily   empagliflozin  10 mg Oral Daily   hydrALAZINE  25 mg Oral Q6H   irbesartan  75 mg Oral Daily   metoprolol tartrate  25 mg Oral BID   sodium chloride flush  3 mL Intravenous Q12H   Continuous Infusions:      LOS: 2 days   Marinda Elk  [facility]  01/04/2023, 7:55 AM

## 2023-01-05 DIAGNOSIS — I509 Heart failure, unspecified: Secondary | ICD-10-CM | POA: Diagnosis not present

## 2023-01-05 DIAGNOSIS — J9601 Acute respiratory failure with hypoxia: Secondary | ICD-10-CM | POA: Diagnosis not present

## 2023-01-05 DIAGNOSIS — I480 Paroxysmal atrial fibrillation: Secondary | ICD-10-CM | POA: Diagnosis not present

## 2023-01-05 DIAGNOSIS — J810 Acute pulmonary edema: Secondary | ICD-10-CM | POA: Diagnosis not present

## 2023-01-05 LAB — BASIC METABOLIC PANEL
Anion gap: 12 (ref 5–15)
BUN: 32 mg/dL — ABNORMAL HIGH (ref 8–23)
CO2: 25 mmol/L (ref 22–32)
Calcium: 9.4 mg/dL (ref 8.9–10.3)
Chloride: 99 mmol/L (ref 98–111)
Creatinine, Ser: 1.81 mg/dL — ABNORMAL HIGH (ref 0.44–1.00)
GFR, Estimated: 27 mL/min — ABNORMAL LOW (ref >60)
Glucose, Bld: 104 mg/dL — ABNORMAL HIGH (ref 70–99)
Potassium: 4.4 mmol/L (ref 3.5–5.1)
Sodium: 136 mmol/L (ref 135–145)

## 2023-01-05 MED ORDER — SODIUM CHLORIDE 0.9 % IV BOLUS
500.0000 mL | Freq: Once | INTRAVENOUS | Status: AC
  Administered 2023-01-05: 500 mL via INTRAVENOUS

## 2023-01-05 NOTE — Progress Notes (Signed)
Physical Therapy Treatment Patient Details Name: Brenda Matthews MRN: [id number] DOB: May 22, 1935 Today's Date: 01/05/2023   History of Present Illness Brenda Matthews is an 87 y.o. female admitted for lower extremity swelling and SOB 12/16. Chest x-ray was consistent with mild pulmonary edema PMH: chronic afib on amiodarone and Eliquis, chronic diastolic heart failure, CKD stage III, essential hypertension recently treated with antibiotics for her lower extremity nodule by PCP    PT Comments  Patient resting supine in bed and recently woke up, lunch at bedside and out of reach. Pt agreeable to mobilize OOB to recliner for lunch. Pt reporting unsure she can participate in further mobility at this time due to fatigue. Min assist provided for supine>sit and pt reliant on bed features. Once EOB balance stable with UE support. RW provided for sit<>stand and pt completed power up with CGA only. CGA for safety and pt able to sequence steps bed>chair and manage walker through turn. Repositioned for lunch and call bell and all needs within reach at EOS. Will continue to progress pt as able during stay.   If plan is discharge home, recommend the following: A little help with walking and/or transfers;A little help with bathing/dressing/bathroom   Can travel by private vehicle     Yes  Equipment Recommendations  None recommended by PT    Recommendations for Other Services       Precautions / Restrictions Precautions Precautions: Fall Restrictions Weight Bearing Restrictions Per Provider Order: No     Mobility  Bed Mobility Overal bed mobility: Needs Assistance Bed Mobility: Supine to Sit     Supine to sit: Min assist, HOB elevated, Used rails     General bed mobility comments: min assist with cues to use bed features, pt abel to bring LE's off EOB and assist to raise trunk upright.    Transfers Overall transfer level: Needs assistance Equipment used: Rolling walker (2 wheels) Transfers: Sit  to/from Stand, Bed to chair/wheelchair/BSC Sit to Stand: Contact guard assist   Step pivot transfers: Contact guard assist       General transfer comment: CGA for rise to RW, pt stable in standing and able to manage RW with turn to move bed>chair. no buckling or LOB.    Ambulation/Gait                   Stairs             Wheelchair Mobility     Tilt Bed    Modified Rankin (Stroke Patients Only)       Balance Overall balance assessment: Needs assistance Sitting-balance support: Feet supported, No upper extremity supported Sitting balance-Leahy Scale: Fair     Standing balance support: No upper extremity supported, During functional activity Standing balance-Leahy Scale: Poor                              Cognition Arousal: Lethargic, Alert Behavior During Therapy: WFL for tasks assessed/performed Overall Cognitive Status: Within Functional Limits for tasks assessed                                 General Comments: Pt sleeping upon arrival and awoke easily but frequently began falling back to sleep throughout session.        Exercises      General Comments General comments (skin integrity, edema, etc.): VSS on RA throughout  session.      Pertinent Vitals/Pain Pain Assessment Pain Assessment: No/denies pain    Home Living                          Prior Function            PT Goals (current goals can now be found in the care plan section) Acute Rehab PT Goals Patient Stated Goal: back to ILF PT Goal Formulation: With patient Time For Goal Achievement: 01/17/23 Potential to Achieve Goals: Good Progress towards PT goals: Progressing toward goals    Frequency    Min 1X/week      PT Plan      Co-evaluation              AM-PAC PT "6 Clicks" Mobility   Outcome Measure  Help needed turning from your back to your side while in a flat bed without using bedrails?: A Little Help needed  moving from lying on your back to sitting on the side of a flat bed without using bedrails?: A Little Help needed moving to and from a bed to a chair (including a wheelchair)?: A Little Help needed standing up from a chair using your arms (e.g., wheelchair or bedside chair)?: A Little Help needed to walk in hospital room?: A Little Help needed climbing 3-5 steps with a railing? : A Lot 6 Click Score: 17    End of Session Equipment Utilized During Treatment: Gait belt;Oxygen Activity Tolerance: Patient tolerated treatment well Patient left: in chair;with call bell/phone within reach Nurse Communication: Mobility status PT Visit Diagnosis: Unsteadiness on feet (R26.81);Muscle weakness (generalized) (M62.81)     Time: 1202-1213 PT Time Calculation (min) (ACUTE ONLY): 11 min  Charges:    $Therapeutic Activity: 8-22 mins PT General Charges $$ ACUTE PT VISIT: 1 Visit                     Wynn Maudlin, DPT Acute Rehabilitation Services Office [phone]  01/05/23 5:34 PM

## 2023-01-05 NOTE — Progress Notes (Signed)
[facility] PROGRESS NOTE    Progress Note  Brenda Matthews  ZOX:[id number] DOB: 1935-03-04 DOA: 01/02/2023 PCP: Jeoffrey Massed, MD     Brief Narrative:   Brenda Matthews is an 87 y.o. female past medical history of chronic atrial fibrillation on amiodarone and Eliquis, chronic diastolic heart failure, chronic kidney disease stage III, essential hypertension recently treated with antibiotics for her lower extremity nodule by PCP comes in for lower extremity swelling and shortness of breath that started on the night of admission. Chest x-ray was consistent with mild pulmonary edema, BNP in the thousands and  systolic blood pressure in the 200.  Initially needed BiPAP in the ED started on IV nitroglycerin we were able to wean her off BiPAP.  Assessment/Plan:  Hypertensive emergency/ Essential hypertension: Estimated dry weight  76 kg. Continue hydralazine follow-up. IV Lasix was held yesterday, hold Avapro this morning due to acute kidney injury. Pressure significantly improved. Dose of metoprolol was decreased due to bradycardia we will continue current dose. PT evaluated the patient recommended skilled nursing facility. Try to wean to room air. Liberalize diet.  Acute respiratory failure with hypoxemia (HCC)/acute pulmonary edema in the setting of acute diastolic dysfunction: Is been weaned to room air. Out of bed to chair continues inspirometry try to wean to room air. Continue to hold IV Lasix. She is negative about 7 L. Continue strict I's and O's and daily weights. Continue metoprolol and hydralazine. 2D echo shows an EF of 60% grade 1 diastolic dysfunction. Hold Avapro and Lasix due to acute kidney injury.  Acute kidney injury: Likely due to overdiuresis hold Lasix and Avapro will give a bolus of normal saline recheck basic metabolic panel in the morning. Baseline creatinine is around 1.3-1.5.  Hypokalemia: Repleted orally now improved.  PAF (paroxysmal atrial  fibrillation) (HCC) Rate controlled continue metoprolol, amiodarone and Eliquis. Try to keep potassium greater than 4 magnesium greater than 2. Became bradycardic overnight agree with lower dose of metoprolol.  Acute urinary retention Foley in place.   DVT prophylaxis: Eliquis Family Communication:none Status is: Inpatient Remains inpatient appropriate because: Hypertensive emergency    Code Status:     Code Status Orders  (From admission, onward)           Start     Ordered   01/02/23 1058  Full code  Continuous       Question:  By:  Answer:  Other   01/02/23 1058           Code Status History     Date Active Date Inactive Code Status Order ID Comments User Context   10/22/2022 2246 10/26/2022 1957 Full Code [id number]  Briscoe Deutscher, MD Inpatient   06/11/2022 1225 06/24/2022 1916 Full Code [id number]  Maryln Gottron, MD ED   09/14/2020 0945 09/20/2020 0204 Full Code [id number]  Marlow Baars, MD Inpatient   09/26/2018 1310 09/27/2018 1831 Full Code [id number]  Ranee Gosselin, MD Inpatient   08/29/2017 1438 09/11/2017 2152 Full Code [id number]  Gwenyth Bender, NP ED   02/18/2016 1952 02/20/2016 2005 Full Code [id number]  Toni Arthurs, MD Inpatient         IV Access:   Peripheral IV   Procedures and diagnostic studies:   ECHOCARDIOGRAM COMPLETE Result Date: 01/03/2023    ECHOCARDIOGRAM REPORT   Patient Name:   Brenda Matthews Date of Exam: 01/03/2023 Medical Rec #:  [id number]    Height:       61.0 in Accession #:  [id number]   Weight:       167.8 lb Date of Birth:  Nov 29, 1935    BSA:          1.753 m Patient Age:    87 years     BP:           135/58 mmHg Patient Gender: F            HR:           58 bpm. Exam Location:  Inpatient Procedure: 2D Echo, Cardiac Doppler, Color Doppler and Intracardiac            Opacification Agent Indications:    CHF  History:        Patient has prior history of Echocardiogram examinations, most                 recent 06/09/2022. CHF,  Arrythmias:Atrial Fibrillation,                 Signs/Symptoms:Dyspnea; Risk Factors:Hypertension.  Sonographer:    Webb Laws Referring Phys: [id number] ALLISON L ELLIS IMPRESSIONS  1. Left ventricular ejection fraction, by estimation, is 60 to 65%. The left ventricle has normal function. The left ventricle has no regional wall motion abnormalities. There is mild concentric left ventricular hypertrophy. Left ventricular diastolic parameters are consistent with Grade I diastolic dysfunction (impaired relaxation).  2. Right ventricular systolic function is normal. The right ventricular size is normal. There is normal pulmonary artery systolic pressure.  3. Left atrial size was moderately dilated.  4. Right atrial size was moderately dilated.  5. The mitral valve is normal in structure. Trivial mitral valve regurgitation. No evidence of mitral stenosis.  6. The aortic valve is tricuspid. There is mild calcification of the aortic valve. Aortic valve regurgitation is mild. No aortic stenosis is present.  7. Aortic dilatation noted. There is mild dilatation of the ascending aorta, measuring 40 mm.  8. The inferior vena cava is normal in size with <50% respiratory variability, suggesting right atrial pressure of 8 mmHg. FINDINGS  Left Ventricle: Left ventricular ejection fraction, by estimation, is 60 to 65%. The left ventricle has normal function. The left ventricle has no regional wall motion abnormalities. Definity contrast agent was given IV to delineate the left ventricular  endocardial borders. The left ventricular internal cavity size was normal in size. There is mild concentric left ventricular hypertrophy. Left ventricular diastolic parameters are consistent with Grade I diastolic dysfunction (impaired relaxation). Right Ventricle: The right ventricular size is normal. No increase in right ventricular wall thickness. Right ventricular systolic function is normal. There is normal pulmonary artery systolic  pressure. The tricuspid regurgitant velocity is 2.57 m/s, and  with an assumed right atrial pressure of 8 mmHg, the estimated right ventricular systolic pressure is 34.4 mmHg. Left Atrium: Left atrial size was moderately dilated. Right Atrium: Right atrial size was moderately dilated. Pericardium: There is no evidence of pericardial effusion. Mitral Valve: The mitral valve is normal in structure. Trivial mitral valve regurgitation. No evidence of mitral valve stenosis. Tricuspid Valve: The tricuspid valve is normal in structure. Tricuspid valve regurgitation is mild . No evidence of tricuspid stenosis. Aortic Valve: The aortic valve is tricuspid. There is mild calcification of the aortic valve. Aortic valve regurgitation is mild. No aortic stenosis is present. Pulmonic Valve: The pulmonic valve was not well visualized. Pulmonic valve regurgitation is not visualized. No evidence of pulmonic stenosis. Aorta: The aortic root is normal in size and structure and aortic dilatation  noted. There is mild dilatation of the ascending aorta, measuring 40 mm. Venous: The inferior vena cava is normal in size with less than 50% respiratory variability, suggesting right atrial pressure of 8 mmHg. IAS/Shunts: No atrial level shunt detected by color flow Doppler.  LEFT VENTRICLE PLAX 2D LVIDd:         4.80 cm      Diastology LVIDs:         3.30 cm      LV e' medial:    7.72 cm/s LV PW:         1.10 cm      LV E/e' medial:  7.9 LV IVS:        1.30 cm      LV e' lateral:   8.70 cm/s LVOT diam:     1.90 cm      LV E/e' lateral: 7.0 LV SV:         60 LV SV Index:   34 LVOT Area:     2.84 cm  LV Volumes (MOD) LV vol d, MOD A2C: 114.0 ml LV vol d, MOD A4C: 123.0 ml LV vol s, MOD A2C: 36.8 ml LV vol s, MOD A4C: 45.1 ml LV SV MOD A2C:     77.2 ml LV SV MOD A4C:     123.0 ml LV SV MOD BP:      76.9 ml RIGHT VENTRICLE RV Basal diam:  4.00 cm RV S prime:     11.70 cm/s TAPSE (M-mode): 2.3 cm LEFT ATRIUM             Index        RIGHT ATRIUM            Index LA diam:        4.90 cm 2.80 cm/m   RA Area:     21.10 cm LA Vol (A2C):   51.6 ml 29.44 ml/m  RA Volume:   59.90 ml  34.17 ml/m LA Vol (A4C):   46.4 ml 26.47 ml/m LA Biplane Vol: 49.1 ml 28.01 ml/m  AORTIC VALVE LVOT Vmax:   104.00 cm/s LVOT Vmean:  73.300 cm/s LVOT VTI:    0.211 m  AORTA Ao Root diam: 2.90 cm Ao Asc diam:  4.00 cm MITRAL VALVE               TRICUSPID VALVE MV Area (PHT): 3.67 cm    TR Peak grad:   26.4 mmHg MV E velocity: 61.20 cm/s  TR Vmax:        257.00 cm/s MV A velocity: 67.30 cm/s MV E/A ratio:  0.91        SHUNTS                            Systemic VTI:  0.21 m                            Systemic Diam: 1.90 cm Arvilla Meres MD Electronically signed by Arvilla Meres MD Signature Date/Time: 01/03/2023/12:43:42 PM    Final      Medical Consultants:   None.   Subjective:    Brenda Matthews she relates she feels much better her appetite is returned.  Objective:    Vitals:   01/05/23 0029 01/05/23 0329 01/05/23 0826 01/05/23 0831  BP: 137/70 (!) 136/57 (!) 110/48 (!) 111/54  Pulse:  62 71 71  Resp:  19 19 (!) 21  Temp:  97.6 F (36.4 C) 98.7 F (37.1 C)   TempSrc:  Oral Oral   SpO2:  91% 91% 91%  Weight:  77.7 kg    Height:       SpO2: 91 % O2 Flow Rate (L/min): 1 L/min FiO2 (%): 40 %   Intake/Output Summary (Last 24 hours) at 01/05/2023 0831 Last data filed at 01/05/2023 0024 Gross per 24 hour  Intake 183 ml  Output 1076 ml  Net -893 ml   Filed Weights   01/03/23 0434 01/04/23 0434 01/05/23 0329  Weight: 76.1 kg 76.3 kg 77.7 kg    Exam: General exam: In no acute distress. Respiratory system: Good air movement and clear to auscultation. Cardiovascular system: S1 & S2 heard, RRR. No JVD. Gastrointestinal system: Abdomen is nondistended, soft and nontender.  Extremities: No pedal edema. Skin: No rashes, lesions or ulcers Psychiatry: Judgement and insight appear normal. Mood & affect appropriate.  Data Reviewed:     Labs: Basic Metabolic Panel: Recent Labs  Lab 01/02/23 0855 01/02/23 0907 01/03/23 0302 01/04/23 0312 01/05/23 0249  NA 135 139 139 141 136  K 4.1 4.2 3.2* 3.7 4.4  CL 107  --  106 106 99  CO2 20*  --  26 26 25   GLUCOSE 109*  --  102* 117* 104*  BUN 20  --  24* 27* 32*  CREATININE 1.19*  --  1.25* 1.49* 1.81*  CALCIUM 9.0  --  8.8* 9.0 9.4   GFR Estimated Creatinine Clearance: 21.5 mL/min (A) (by C-G formula based on SCr of 1.81 mg/dL (H)). Liver Function Tests: Recent Labs  Lab 01/02/23 0855  AST 31  ALT 24  ALKPHOS 111  BILITOT 1.4*  PROT 6.3*  ALBUMIN 2.8*   No results for input(s): "LIPASE", "AMYLASE" in the last 168 hours. No results for input(s): "AMMONIA" in the last 168 hours. Coagulation profile No results for input(s): "INR", "PROTIME" in the last 168 hours. COVID-19 Labs  Recent Labs    01/02/23 1055  CRP 6.6*    Lab Results  Component Value Date   SARSCOV2NAA NEGATIVE 01/02/2023   SARSCOV2NAA NEGATIVE 10/22/2022   SARSCOV2NAA NEGATIVE 09/19/2020   SARSCOV2NAA NEGATIVE 09/14/2020    CBC: Recent Labs  Lab 01/02/23 0855 01/02/23 0907  WBC 9.8  --   NEUTROABS 7.8*  --   HGB 10.3* 10.9*  HCT 34.0* 32.0*  MCV 96.3  --   PLT 261  --    Cardiac Enzymes: No results for input(s): "CKTOTAL", "CKMB", "CKMBINDEX", "TROPONINI" in the last 168 hours. BNP (last 3 results) No results for input(s): "PROBNP" in the last 8760 hours. CBG: No results for input(s): "GLUCAP" in the last 168 hours. D-Dimer: No results for input(s): "DDIMER" in the last 72 hours. Hgb A1c: No results for input(s): "HGBA1C" in the last 72 hours. Lipid Profile: No results for input(s): "CHOL", "HDL", "LDLCALC", "TRIG", "CHOLHDL", "LDLDIRECT" in the last 72 hours. Thyroid function studies: No results for input(s): "TSH", "T4TOTAL", "T3FREE", "THYROIDAB" in the last 72 hours.  Invalid input(s): "FREET3" Anemia work up: No results for input(s): "VITAMINB12", "FOLATE",  "FERRITIN", "TIBC", "IRON", "RETICCTPCT" in the last 72 hours. Sepsis Labs: Recent Labs  Lab 01/02/23 0855 01/02/23 1055  PROCALCITON  --  <0.10  WBC 9.8  --    Microbiology Recent Results (from the past 240 hours)  SARS Coronavirus 2 by RT PCR (hospital order, performed in [facility] hospital lab) *cepheid single result test* Anterior Nasal  Swab     Status: None   Collection Time: 01/02/23  9:13 AM   Specimen: Anterior Nasal Swab  Result Value Ref Range Status   SARS Coronavirus 2 by RT PCR NEGATIVE NEGATIVE Final    Comment: Performed at [facility], [address], [place], [place] [address]  Respiratory (~20 pathogens) panel by PCR     Status: None   Collection Time: 01/02/23  5:36 PM   Specimen: Nasopharyngeal Swab; Respiratory  Result Value Ref Range Status   Adenovirus NOT DETECTED NOT DETECTED Final   Coronavirus 229E NOT DETECTED NOT DETECTED Final    Comment: (NOTE) The Coronavirus on the Respiratory Panel, DOES NOT test for the novel  Coronavirus (2019 nCoV)    Coronavirus HKU1 NOT DETECTED NOT DETECTED Final   Coronavirus NL63 NOT DETECTED NOT DETECTED Final   Coronavirus OC43 NOT DETECTED NOT DETECTED Final   Metapneumovirus NOT DETECTED NOT DETECTED Final   Rhinovirus / Enterovirus NOT DETECTED NOT DETECTED Final   Influenza A NOT DETECTED NOT DETECTED Final   Influenza B NOT DETECTED NOT DETECTED Final   Parainfluenza Virus 1 NOT DETECTED NOT DETECTED Final   Parainfluenza Virus 2 NOT DETECTED NOT DETECTED Final   Parainfluenza Virus 3 NOT DETECTED NOT DETECTED Final   Parainfluenza Virus 4 NOT DETECTED NOT DETECTED Final   Respiratory Syncytial Virus NOT DETECTED NOT DETECTED Final   Bordetella pertussis NOT DETECTED NOT DETECTED Final   Bordetella Parapertussis NOT DETECTED NOT DETECTED Final   Chlamydophila pneumoniae NOT DETECTED NOT DETECTED Final   Mycoplasma pneumoniae NOT DETECTED NOT DETECTED Final    Comment: Performed at [facility]  Lab, [address], [place], [place] [address]     Medications:    amiodarone  200 mg Oral Daily   apixaban  5 mg Oral BID   Chlorhexidine Gluconate Cloth  6 each Topical Daily   empagliflozin  10 mg Oral Daily   hydrALAZINE  25 mg Oral Q6H   metoprolol tartrate  25 mg Oral BID   polyethylene glycol  17 g Oral BID   sodium chloride flush  3 mL Intravenous Q12H   Continuous Infusions:  sodium chloride         LOS: 3 days   Marinda Elk  [facility]  01/05/2023, 8:31 AM

## 2023-01-05 NOTE — Progress Notes (Signed)
Occupational Therapy Treatment Patient Details Name: Brenda Matthews MRN: [id number] DOB: [birth year] Today's Date: 01/05/2023   History of present illness Brenda Matthews is an 87 y.o. female admitted for lower extremity swelling and SOB 12/16. Chest x-ray was consistent with mild pulmonary edema PMH: chronic afib on amiodarone and Eliquis, chronic diastolic heart failure, CKD stage III, essential hypertension recently treated with antibiotics for her lower extremity nodule by PCP   OT comments  Pt asleep upon OT arrival. Pt easy to awaken but quickly becoming lethargic again and beginning to fall asleep several times during session. OT instructed pt in B UE therapeutic exercises to increase strength and activity tolerance for carryover to functional tasks with pt demonstrating understanding through teach back. Pt participated well during session but was limited by fatigue and lethargy. Pt is making progress toward OT goals. Pt will benefit from continued acute skilled OT services to address deficits outlined below and increase safety and independence with functional tasks. Post acute discharge, pt will benefit from intensive inpatient skilled rehab services < 3 hours per day to maxim ize rehab potential.       If plan is discharge home, recommend the following:  A little help with walking and/or transfers;A little help with bathing/dressing/bathroom;Assistance with cooking/housework;Help with stairs or ramp for entrance;Assist for transportation   Equipment Recommendations  Other (comment) (defer to next level of care)    Recommendations for Other Services      Precautions / Restrictions Precautions Precautions: Fall Restrictions Weight Bearing Restrictions Per Provider Order: No       Mobility Bed Mobility               General bed mobility comments: Pt received in bed and declined bed mobility and OOB activity this session due to sitting up in recliner for several hours earlier this  day and not sleeping well last night.    Transfers                         Balance                                           ADL either performed or assessed with clinical judgement   ADL Overall ADL's : Needs assistance/impaired                                       General ADL Comments: Pt declined addressing ADLs this session due to completing ADLs earlier with NT.    Extremity/Trunk Assessment Upper Extremity Assessment Upper Extremity Assessment: Generalized weakness (Per chart review, pt with limited B shoulder flexion. However, B shoulder flexion WFL this day.)   Lower Extremity Assessment Lower Extremity Assessment: Defer to PT evaluation        Vision       Perception     Praxis      Cognition Arousal: Lethargic, Alert Behavior During Therapy: WFL for tasks assessed/performed Overall Cognitive Status: Within Functional Limits for tasks assessed                                 General Comments: Pt sleeping upon arrival and awoke easily but frequently began falling back to sleep throughout  session.        Exercises Exercises: General Upper Extremity General Exercises - Upper Extremity Shoulder Flexion: AROM, Strengthening, Both, Other reps (comment), Supine (with HOB elevated; 2 sets 10 reps, increased activity tolerance) Shoulder Extension: AROM, Strengthening, Both, Other reps (comment), Supine (with HOB elevated; 2 sets 10 reps, increased activity tolerance) Shoulder ABduction: AROM, Strengthening, Both, Other reps (comment), Supine (with HOB elevated; 2 sets 10 reps, increased activity tolerance) Shoulder ADduction: AROM, Strengthening, Both, Other reps (comment), Supine (with HOB elevated; 2 sets 10 reps, increased activity tolerance) Shoulder Horizontal ABduction: AROM, Both, Other reps (comment), Supine (with HOB elevated; 2 sets 10 reps, increased activity tolerance) Shoulder Horizontal  ADduction: AROM, Strengthening, Both, Other reps (comment), Supine (with HOB elevated; 2 sets 10 reps, increased activity tolerance) Elbow Flexion: AROM, Both, Other reps (comment), Supine (with HOB elevated; 2 sets 10 reps, increased activity tolerance) Elbow Extension: AROM, Strengthening, Other reps (comment), Supine (with HOB elevated; 2 sets 10 reps, increased activity tolerance)    Shoulder Instructions       General Comments VSS on RA throughout session.    Pertinent Vitals/ Pain       Pain Assessment Pain Assessment: No/denies pain  Home Living                                          Prior Functioning/Environment              Frequency  Min 1X/week        Progress Toward Goals  OT Goals(current goals can now be found in the care plan section)  Progress towards OT goals: Progressing toward goals  Acute Rehab OT Goals Patient Stated Goal: to get some rest and get stronger so she can return home  Plan      Co-evaluation                 AM-PAC OT "6 Clicks" Daily Activity     Outcome Measure   Help from another person eating meals?: None Help from another person taking care of personal grooming?: A Little Help from another person toileting, which includes using toliet, bedpan, or urinal?: A Little Help from another person bathing (including washing, rinsing, drying)?: A Little Help from another person to put on and taking off regular upper body clothing?: A Little Help from another person to put on and taking off regular lower body clothing?: A Little 6 Click Score: 19    End of Session    OT Visit Diagnosis: Muscle weakness (generalized) (M62.81);Other (comment) (decreased activity tolerance)   Activity Tolerance Patient tolerated treatment well;Patient limited by lethargy;Patient limited by fatigue   Patient Left in bed;with call bell/phone within reach;with bed alarm set   Nurse Communication Mobility status         Time: 1610-9604 OT Time Calculation (min): 14 min  Charges: OT General Charges $OT Visit: 1 Visit OT Treatments $Therapeutic Exercise: 8-22 mins  Rocquel Askren "Orson Eva., OTR/L, MA Acute Rehab [phone]   Lendon Colonel 01/05/2023, 4:26 PM

## 2023-01-05 NOTE — TOC Progression Note (Addendum)
Transition of Care [facility]) - Progression Note    Patient Details  Name: Brenda Matthews MRN: [id number] Date of Birth: [birth year]  [facility] CM/SW Contact  Michaela Corner, [place] Phone Number: 01/05/2023, 10:18 AM  Clinical Narrative:   Ins auth approved 01/05/2023-01/09/2023. Auth ID: [id number].  CSW notified Drew at [facility] has been approved and pt will potentially dc tomorrow.    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  Expected Discharge Plan and Services In-house Referral: Clinical Social Work     Living arrangements for the past 2 months: Marketing executive ([place])                                       Social Determinants of Health (SDOH) Interventions SDOH Screenings   Food Insecurity: No Food Insecurity (01/02/2023)  Housing: Low Risk  (01/02/2023)  Transportation Needs: No Transportation Needs (01/02/2023)  Utilities: Not At Risk (01/02/2023)  Alcohol Screen: Low Risk  (10/11/2020)  Depression (PHQ2-9): Low Risk  (09/01/2022)  Financial Resource Strain: Low Risk  (10/27/2021)  Physical Activity: Sufficiently Active (10/27/2021)  Social Connections: Moderately Integrated (10/27/2021)  Stress: No Stress Concern Present (10/27/2021)  Tobacco Use: Low Risk  (01/02/2023)    Readmission Risk Interventions    10/24/2022    9:08 AM  Readmission Risk Prevention Plan  Post Dischage Appt Complete  Medication Screening Complete  Transportation Screening Complete

## 2023-01-06 DIAGNOSIS — J9601 Acute respiratory failure with hypoxia: Secondary | ICD-10-CM | POA: Diagnosis not present

## 2023-01-06 DIAGNOSIS — I480 Paroxysmal atrial fibrillation: Secondary | ICD-10-CM | POA: Diagnosis not present

## 2023-01-06 DIAGNOSIS — J810 Acute pulmonary edema: Secondary | ICD-10-CM | POA: Diagnosis not present

## 2023-01-06 DIAGNOSIS — I509 Heart failure, unspecified: Secondary | ICD-10-CM | POA: Diagnosis not present

## 2023-01-06 LAB — BASIC METABOLIC PANEL
Anion gap: 13 (ref 5–15)
BUN: 30 mg/dL — ABNORMAL HIGH (ref 8–23)
CO2: 25 mmol/L (ref 22–32)
Calcium: 9 mg/dL (ref 8.9–10.3)
Chloride: 104 mmol/L (ref 98–111)
Creatinine, Ser: 1.63 mg/dL — ABNORMAL HIGH (ref 0.44–1.00)
GFR, Estimated: 31 mL/min — ABNORMAL LOW (ref >60)
Glucose, Bld: 131 mg/dL — ABNORMAL HIGH (ref 70–99)
Potassium: 5.1 mmol/L (ref 3.5–5.1)
Sodium: 142 mmol/L (ref 135–145)

## 2023-01-06 MED ORDER — HYDROCORTISONE 1 % EX CREA
TOPICAL_CREAM | Freq: Three times a day (TID) | CUTANEOUS | Status: DC
Start: 2023-01-06 — End: 2023-01-14
  Filled 2023-01-06 (×4): qty 28

## 2023-01-06 MED ORDER — FUROSEMIDE 40 MG PO TABS
40.0000 mg | ORAL_TABLET | Freq: Every day | ORAL | Status: DC
  Administered 2023-01-06 – 2023-01-07 (×2): 40 mg via ORAL
  Filled 2023-01-06 (×2): qty 1

## 2023-01-06 MED ORDER — DIPHENHYDRAMINE-ZINC ACETATE 2-0.1 % EX CREA: TOPICAL_CREAM | Freq: Three times a day (TID) | CUTANEOUS | Status: DC | PRN

## 2023-01-06 MED ORDER — DOXYCYCLINE HYCLATE 100 MG PO TABS
100.0000 mg | ORAL_TABLET | Freq: Two times a day (BID) | ORAL | Status: DC
  Administered 2023-01-06 – 2023-01-11 (×11): 100 mg via ORAL
  Filled 2023-01-06 (×11): qty 1

## 2023-01-06 NOTE — Progress Notes (Signed)
Mobility Specialist Progress Note:    01/06/23 1049  Mobility  Activity Dangled on edge of bed (BLE exercises in the chair)  Level of Assistance Standby assist, set-up cues, supervision of patient - no hands on  Assistive Device None  Activity Response Tolerated well  Mobility Referral Yes  Mobility visit 1 Mobility  Mobility Specialist Start Time (ACUTE ONLY) 0930  Mobility Specialist Stop Time (ACUTE ONLY) 0944  Mobility Specialist Time Calculation (min) (ACUTE ONLY) 14 min   Pt received in chair very anxious and upset. Pt c/o strong itching in both feet. Declined ambulation, even w/ max encouragement. But was able to perform some BLE exercises. Call bell and personal belongings in reach. All needs met. RN notified.  Thompson Grayer Mobility Specialist  Please contact vis Secure Chat or  Rehab Office [phone]

## 2023-01-06 NOTE — Progress Notes (Signed)
   01/06/23 0030  BiPAP/CPAP/SIPAP  Reason BIPAP/CPAP not in use Other(comment) (prn, not indicated at this time)  BiPAP/CPAP /SiPAP Vitals  Pulse Rate 65  Resp (!) 22  SpO2 (!) 88 %  MEWS Score/Color  MEWS Score 1  MEWS Score Color Green

## 2023-01-06 NOTE — Progress Notes (Addendum)
During shift change this morning, patient verbalized that she had called to used the bathroom during the night, and someone answered the call light and told her that she was able to ambulate to the [facility] commode on her own, patient very emotional about the above situation.  Patient requesting to speak with head nurse about the situation, Darl Pikes made aware of the situation.  Around 0800, patient called that her feet and leg were itching and hurting and wanted nurse to rub cream in between her toes and feet to help the itching, RN went in and assessed her leg and feet and they look red and swollen. This Clinical research associate informed patient that we didn't had any itching cream available at the moment and was going to inform the MD about the foot, and ask him  to order her something for itching but she asked for me to applied some of the  pink tube cream that was on the table which I did, and insisted that I rub it between her toes. I informed her we had to wait for the MD to come and look at it before applying any cream in between her toes, patient got teary and said "nobody wants to do what she said". See new orders from MD, hydrocortisone cream and ted hose applied to LE as ordered.

## 2023-01-06 NOTE — Progress Notes (Signed)
[facility] PROGRESS NOTE    Progress Note  Brenda Matthews  ZOX:[id number] DOB: May 19, 1935 DOA: 01/02/2023 PCP: Jeoffrey Massed, MD     Brief Narrative:   Brenda Matthews is an 87 y.o. female past medical history of chronic atrial fibrillation on amiodarone and Eliquis, chronic diastolic heart failure, chronic kidney disease stage III, essential hypertension recently treated with antibiotics for her lower extremity nodule by PCP comes in for lower extremity swelling and shortness of breath that started on the night of admission. Chest x-ray was consistent with mild pulmonary edema, BNP in the thousands and  systolic blood pressure in the 200.  Initially needed BiPAP in the ED started on IV nitroglycerin we were able to wean her off BiPAP.  Assessment/Plan:  Hypertensive emergency/ Essential hypertension: Estimated dry weight  76 kg. Continue hydralazine follow-up. Continue to hold Avapro, due to acute kidney injury. Pressure significantly improved. Feet are swollen apply TED hose. Dose of metoprolol was decreased due to bradycardia we will continue current dose. PT evaluated the patient recommended skilled nursing facility. Try to wean to room air. Liberalize diet.  Acute respiratory failure with hypoxemia (HCC)/acute pulmonary edema in the setting of acute diastolic dysfunction: Is been weaned to room air. Out of bed to chair continues inspirometry try to wean to room air. Continue to hold IV Lasix. She is negative about 6 L Continue strict I's and O's and daily weights. Continue metoprolol and hydralazine. 2D echo shows an EF of 60% grade 1 diastolic dysfunction. Hold Avapro and Lasix due to acute kidney injury.  Right lower extremity cellulitis: Started orally on doxycycline.  Acute kidney injury: Likely due to overdiuresis hold Lasix and Avapro status post half a liter bolus of normal saline. Her creatinine has stabilized and improving. Recheck basic metabolic panel  in the morning Baseline creatinine is around 1.3-1.5.  Hypokalemia: Repleted, now high. Hold oral supplementation. Continue to hold Avapro. Recheck basic metabolic panel tomorrow morning.  PAF (paroxysmal atrial fibrillation) (HCC) Rate controlled continue metoprolol, amiodarone and Eliquis. Try to keep potassium greater than 4 magnesium greater than 2. Became bradycardic overnight agree with lower dose of metoprolol.  Acute urinary retention Foley in place. Will give a voiding trial.   DVT prophylaxis: Eliquis Family Communication:none Status is: Inpatient Remains inpatient appropriate because: Hypertensive emergency    Code Status:     Code Status Orders  (From admission, onward)           Start     Ordered   01/02/23 1058  Full code  Continuous       Question:  By:  Answer:  Other   01/02/23 1058           Code Status History     Date Active Date Inactive Code Status Order ID Comments User Context   10/22/2022 2246 10/26/2022 1957 Full Code [id number]  Briscoe Deutscher, MD Inpatient   06/11/2022 1225 06/24/2022 1916 Full Code [id number]  Maryln Gottron, MD ED   09/14/2020 0945 09/20/2020 0204 Full Code [id number]  Marlow Baars, MD Inpatient   09/26/2018 1310 09/27/2018 1831 Full Code [id number]  Ranee Gosselin, MD Inpatient   08/29/2017 1438 09/11/2017 2152 Full Code [id number]  Gwenyth Bender, NP ED   02/18/2016 1952 02/20/2016 2005 Full Code [id number]  Toni Arthurs, MD Inpatient         IV Access:   Peripheral IV   Procedures and diagnostic studies:   No results found.    Medical  Consultants:   None.   Subjective:    [name] she relates her lower extremity are itching right more than left  Objective:    Vitals:   01/06/23 0500 01/06/23 0527 01/06/23 0539 01/06/23 0815  BP: (!) 132/57   (!) 142/68  Pulse: 64   74  Resp: 20 18  20   Temp: 98.3 F (36.8 C)   97.9 F (36.6 C)  TempSrc: Oral   Oral  SpO2:  95%  95%  Weight:   76.7 kg    Height:       SpO2: 95 % O2 Flow Rate (L/min): 1 L/min FiO2 (%): 40 %   Intake/Output Summary (Last 24 hours) at 01/06/2023 0937 Last data filed at 01/06/2023 0816 Gross per 24 hour  Intake 440 ml  Output 400 ml  Net 40 ml   Filed Weights   01/04/23 0434 01/05/23 0329 01/06/23 0539  Weight: 76.3 kg 77.7 kg 76.7 kg    Exam: General exam: In no acute distress. Respiratory system: Good air movement and clear to auscultation. Cardiovascular system: S1 & S2 heard, RRR. No JVD. Gastrointestinal system: Abdomen is nondistended, soft and nontender.  Extremities: No pedal edema. Skin: Right lower remedy is erythematous warm to touch Psychiatry: Judgement and insight appear normal. Mood & affect appropriate.  Data Reviewed:    Labs: Basic Metabolic Panel: Recent Labs  Lab 01/02/23 0855 01/02/23 0907 01/03/23 0302 01/04/23 0312 01/05/23 0249 01/06/23 0317  NA 135 139 139 141 136 142  K 4.1 4.2 3.2* 3.7 4.4 5.1  CL 107  --  106 106 99 104  CO2 20*  --  26 26 25 25   GLUCOSE 109*  --  102* 117* 104* 131*  BUN 20  --  24* 27* 32* 30*  CREATININE 1.19*  --  1.25* 1.49* 1.81* 1.63*  CALCIUM 9.0  --  8.8* 9.0 9.4 9.0   GFR Estimated Creatinine Clearance: 23.7 mL/min (A) (by C-G formula based on SCr of 1.63 mg/dL (H)). Liver Function Tests: Recent Labs  Lab 01/02/23 0855  AST 31  ALT 24  ALKPHOS 111  BILITOT 1.4*  PROT 6.3*  ALBUMIN 2.8*   No results for input(s): "LIPASE", "AMYLASE" in the last 168 hours. No results for input(s): "AMMONIA" in the last 168 hours. Coagulation profile No results for input(s): "INR", "PROTIME" in the last 168 hours. COVID-19 Labs  No results for input(s): "DDIMER", "FERRITIN", "LDH", "CRP" in the last 72 hours.   Lab Results  Component Value Date   SARSCOV2NAA NEGATIVE 01/02/2023   SARSCOV2NAA NEGATIVE 10/22/2022   SARSCOV2NAA NEGATIVE 09/19/2020   SARSCOV2NAA NEGATIVE 09/14/2020    CBC: Recent Labs  Lab 01/02/23 0855  01/02/23 0907  WBC 9.8  --   NEUTROABS 7.8*  --   HGB 10.3* 10.9*  HCT 34.0* 32.0*  MCV 96.3  --   PLT 261  --    Cardiac Enzymes: No results for input(s): "CKTOTAL", "CKMB", "CKMBINDEX", "TROPONINI" in the last 168 hours. BNP (last 3 results) No results for input(s): "PROBNP" in the last 8760 hours. CBG: No results for input(s): "GLUCAP" in the last 168 hours. D-Dimer: No results for input(s): "DDIMER" in the last 72 hours. Hgb A1c: No results for input(s): "HGBA1C" in the last 72 hours. Lipid Profile: No results for input(s): "CHOL", "HDL", "LDLCALC", "TRIG", "CHOLHDL", "LDLDIRECT" in the last 72 hours. Thyroid function studies: No results for input(s): "TSH", "T4TOTAL", "T3FREE", "THYROIDAB" in the last 72 hours.  Invalid input(s): "FREET3"  Anemia work up: No results for input(s): "VITAMINB12", "FOLATE", "FERRITIN", "TIBC", "IRON", "RETICCTPCT" in the last 72 hours. Sepsis Labs: Recent Labs  Lab 01/02/23 0855 01/02/23 1055  PROCALCITON  --  <0.10  WBC 9.8  --    Microbiology Recent Results (from the past 240 hours)  SARS Coronavirus 2 by RT PCR (hospital order, performed in [facility] hospital lab) *cepheid single result test* Anterior Nasal Swab     Status: None   Collection Time: 01/02/23  9:13 AM   Specimen: Anterior Nasal Swab  Result Value Ref Range Status   SARS Coronavirus 2 by RT PCR NEGATIVE NEGATIVE Final    Comment: Performed at [facility], [address], [place], [place] [address]  Respiratory (~20 pathogens) panel by PCR     Status: None   Collection Time: 01/02/23  5:36 PM   Specimen: Nasopharyngeal Swab; Respiratory  Result Value Ref Range Status   Adenovirus NOT DETECTED NOT DETECTED Final   Coronavirus 229E NOT DETECTED NOT DETECTED Final    Comment: (NOTE) The Coronavirus on the Respiratory Panel, DOES NOT test for the novel  Coronavirus (2019 nCoV)    Coronavirus HKU1 NOT DETECTED NOT DETECTED Final   Coronavirus NL63 NOT DETECTED  NOT DETECTED Final   Coronavirus OC43 NOT DETECTED NOT DETECTED Final   Metapneumovirus NOT DETECTED NOT DETECTED Final   Rhinovirus / Enterovirus NOT DETECTED NOT DETECTED Final   Influenza A NOT DETECTED NOT DETECTED Final   Influenza B NOT DETECTED NOT DETECTED Final   Parainfluenza Virus 1 NOT DETECTED NOT DETECTED Final   Parainfluenza Virus 2 NOT DETECTED NOT DETECTED Final   Parainfluenza Virus 3 NOT DETECTED NOT DETECTED Final   Parainfluenza Virus 4 NOT DETECTED NOT DETECTED Final   Respiratory Syncytial Virus NOT DETECTED NOT DETECTED Final   Bordetella pertussis NOT DETECTED NOT DETECTED Final   Bordetella Parapertussis NOT DETECTED NOT DETECTED Final   Chlamydophila pneumoniae NOT DETECTED NOT DETECTED Final   Mycoplasma pneumoniae NOT DETECTED NOT DETECTED Final    Comment: Performed at [facility], [address] [place], [place] [address]     Medications:    amiodarone  200 mg Oral Daily   apixaban  5 mg Oral BID   Chlorhexidine Gluconate Cloth  6 each Topical Daily   empagliflozin  10 mg Oral Daily   hydrALAZINE  25 mg Oral Q6H   metoprolol tartrate  25 mg Oral BID   polyethylene glycol  17 g Oral BID   sodium chloride flush  3 mL Intravenous Q12H   Continuous Infusions:       LOS: 4 days   Marinda Elk  [facility]  01/06/2023, 9:37 AM

## 2023-01-06 NOTE — Progress Notes (Signed)
Physical Therapy Treatment Patient Details Name: Brenda Matthews MRN: [id number] DOB: [birth year] Today's Date: 01/06/2023   History of Present Illness Brenda Matthews is an 87 y.o. female admitted for lower extremity swelling and SOB 12/16. Chest x-ray was consistent with mild pulmonary edema PMH: chronic afib on amiodarone and Eliquis, chronic diastolic heart failure, CKD stage III, essential hypertension recently treated with antibiotics for her lower extremity nodule by PCP.    PT Comments  Pt resting in recliner, pt emotional and reports stress regarding LE pain and edema. Knee high ted hose in place and constricting just below pt's knees. Pt declined to mobilize but engaging therapist in conversation regarding pain and care in hospital. Focus of session placed on self care education for edema management. Discussed adjusting ted hose for improved fit and exercises for edema management. Educated on elevation and ankle pumps for circulation. Pt completed 10 reps bil LE's. Pt remained in recliner at EOS, will follow and continue to progress pt as able.   If plan is discharge home, recommend the following: A little help with walking and/or transfers;A little help with bathing/dressing/bathroom   Can travel by private vehicle     Yes  Equipment Recommendations  None recommended by PT    Recommendations for Other Services OT consult     Precautions / Restrictions Precautions Precautions: Fall Restrictions Weight Bearing Restrictions Per Provider Order: No     Mobility  Bed Mobility                    Transfers                   General transfer comment: pt seated in recliner, LE's in dependent position.    Ambulation/Gait                   Stairs             Wheelchair Mobility     Tilt Bed    Modified Rankin (Stroke Patients Only)       Balance                                            Cognition Arousal:  Alert Behavior During Therapy: Anxious Overall Cognitive Status: Impaired/Different from baseline                                 General Comments: pt despondent regarding care and current status. pt tearful at times.        Exercises      General Comments General comments (skin integrity, edema, etc.): VSS      Pertinent Vitals/Pain Pain Assessment Pain Assessment: Faces Faces Pain Scale: Hurts even more Pain Location: bil LEs Pain Descriptors / Indicators: Aching, Discomfort Pain Intervention(s): Limited activity within patient's tolerance, Monitored during session    Home Living                          Prior Function            PT Goals (current goals can now be found in the care plan section) Acute Rehab PT Goals Patient Stated Goal: back to ILF PT Goal Formulation: With patient Time For Goal Achievement: 01/17/23 Potential to Achieve Goals: Good Progress towards  PT goals: Progressing toward goals    Frequency    Min 1X/week      PT Plan      Co-evaluation              AM-PAC PT "6 Clicks" Mobility   Outcome Measure  Help needed turning from your back to your side while in a flat bed without using bedrails?: A Little Help needed moving from lying on your back to sitting on the side of a flat bed without using bedrails?: A Little Help needed moving to and from a bed to a chair (including a wheelchair)?: A Little Help needed standing up from a chair using your arms (e.g., wheelchair or bedside chair)?: A Little Help needed to walk in hospital room?: A Little Help needed climbing 3-5 steps with a railing? : A Lot 6 Click Score: 17    End of Session Equipment Utilized During Treatment: Gait belt;Oxygen Activity Tolerance: Patient tolerated treatment well Patient left: in chair;with call bell/phone within reach Nurse Communication: Mobility status PT Visit Diagnosis: Unsteadiness on feet (R26.81);Muscle weakness  (generalized) (M62.81)     Time: 1610-9604 PT Time Calculation (min) (ACUTE ONLY): 24 min  Charges:    $Self Care/Home Management: 8-22 PT General Charges $$ ACUTE PT VISIT: 1 Visit                     Wynn Maudlin, DPT Acute Rehabilitation Services Office [phone]  01/06/23 3:33 PM

## 2023-01-06 NOTE — Progress Notes (Signed)
Physical Therapy Treatment Patient Details Name: Brenda Matthews MRN: [id number] DOB: October 23, 1935 Today's Date: 01/06/2023   History of Present Illness Brenda Matthews is an 87 y.o. female admitted for lower extremity swelling and SOB 12/16. Chest x-ray was consistent with mild pulmonary edema PMH: chronic afib on amiodarone and Eliquis, chronic diastolic heart failure, CKD stage III, essential hypertension recently treated with antibiotics for her lower extremity nodule by PCP    PT Comments  Patient seated in recliner and requesting use of BSC. Pt required min assist to power up and pivot from recliner to [facility] with HHA. Pt required min assist to rise and stabilize balance, pt with posterior bias and assist needed to facilitate anterior weight shift. Pt able to void bladder, Max assist required for toileting as pt required assist for 3/3 tasks with lower body clothing management and pericare. RW provided for sit>stand and min assist for stand pivot to chair. On return to recliner mesh underwear removed fully as slightly damp, educated pt on skin hygiene and encouraged to remain out of underwear however pt tearful and stating "I just want them on". Therapist consoled pt that she can have them and that education was simple for pt's benefit, however, she can put a fresh pair on if she prefers. Pt required Max assist to don new brief and pad. Once seated in recliner reviewed education on compression [facility], ankle pumps, and elevating LE's. Total assist to exchange knee high for thigh high ted hose. EOS pt completed 10 reps of ankle pumps. Will continue to progress pt as able.    If plan is discharge home, recommend the following: A little help with walking and/or transfers;A little help with bathing/dressing/bathroom   Can travel by private vehicle     Yes  Equipment Recommendations  None recommended by PT    Recommendations for Other Services OT consult     Precautions / Restrictions  Precautions Precautions: Fall Restrictions Weight Bearing Restrictions Per Provider Order: No     Mobility  Bed Mobility               General bed mobility comments: OOB in recliner    Transfers Overall transfer level: Needs assistance Equipment used: Rolling walker (2 wheels), 1 person hand held assist Transfers: Sit to/from Stand, Bed to chair/wheelchair/BSC Sit to Stand: Min assist   Step pivot transfers: Min assist       General transfer comment: pt required min assist to rise from recliner and for stand then pivot with HHA chair>BSC. pt required assist standing at [facility] for brief management prior to sitting on commode. RW and min assist requried to rise and stabilize balance. Min assist to steady and step back to bed.    Ambulation/Gait                   Stairs             Wheelchair Mobility     Tilt Bed    Modified Rankin (Stroke Patients Only)       Balance Overall balance assessment: Needs assistance Sitting-balance support: Feet supported, No upper extremity supported Sitting balance-Leahy Scale: Fair     Standing balance support: During functional activity, Reliant on assistive device for balance, Bilateral upper extremity supported Standing balance-Leahy Scale: Poor                              Cognition Arousal: Alert Behavior During  Therapy: Anxious Overall Cognitive Status: Impaired/Different from baseline                                 General Comments: pt remains emotional and expressing multiple concerns regarding care. Pt expresses desire to speak with supervisor and Licensed conveyancer notified.        Exercises      General Comments General comments (skin integrity, edema, etc.): VSS.      Pertinent Vitals/Pain Pain Assessment Pain Assessment: Faces Faces Pain Scale: Hurts little more Pain Location: bil LEs Pain Descriptors / Indicators: Aching, Discomfort Pain  Intervention(s): Limited activity within patient's tolerance, Monitored during session, Repositioned    Home Living                          Prior Function            PT Goals (current goals can now be found in the care plan section) Acute Rehab PT Goals Patient Stated Goal: back to ILF PT Goal Formulation: With patient Time For Goal Achievement: 01/17/23 Potential to Achieve Goals: Good Progress towards PT goals: Progressing toward goals    Frequency    Min 1X/week      PT Plan      Co-evaluation              AM-PAC PT "6 Clicks" Mobility   Outcome Measure  Help needed turning from your back to your side while in a flat bed without using bedrails?: A Little Help needed moving from lying on your back to sitting on the side of a flat bed without using bedrails?: A Little Help needed moving to and from a bed to a chair (including a wheelchair)?: A Little Help needed standing up from a chair using your arms (e.g., wheelchair or bedside chair)?: A Little Help needed to walk in hospital room?: A Little Help needed climbing 3-5 steps with a railing? : A Lot 6 Click Score: 17    End of Session Equipment Utilized During Treatment: Gait belt Activity Tolerance: Patient tolerated treatment well Patient left: in chair;with call bell/phone within reach;with chair alarm set Nurse Communication: Mobility status PT Visit Diagnosis: Unsteadiness on feet (R26.81);Muscle weakness (generalized) (M62.81)     Time: 6295-2841 PT Time Calculation (min) (ACUTE ONLY): 26 min  Charges:    $Therapeutic Activity: 23-37 mins $Self Care/Home Management: 8-22 PT General Charges $$ ACUTE PT VISIT: 1 Visit                     Wynn Maudlin, DPT Acute Rehabilitation Services Office [phone]  01/06/23 3:48 PM

## 2023-01-06 NOTE — Care Management Important Message (Signed)
Important Message  Patient Details  Name: Brenda Matthews MRN: [id number] Date of Birth: 30-Nov-1935   Important Message Given:  Yes - Medicare IM     Renie Ora 01/06/2023, 9:12 AM

## 2023-01-06 NOTE — TOC Progression Note (Signed)
Transition of Care [facility]) - Progression Note    Patient Details  Name: Brenda Matthews MRN: [id number] Date of Birth: 06-Mar-1935  Transition of Care [facility]) CM/SW Contact  Michaela Corner, [place] Phone Number: 01/06/2023, 3:11 PM  Clinical Narrative:   CSW spoke with pt about DC plan to [facility]. Pt states she is concerned about having to pay for SNF. CSW called members benefit with patient to determine coverage. Per Misty Stanley w/ [employer]'s member benefits ([phone]) it has been 67 days since pts last SNF admission and pt is not in copay days. Ms. Pugliese gave her verbal understanding and was satisfied with answer from [facility] rep.     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  Expected Discharge Plan and Services In-house Referral: Clinical Social Work     Living arrangements for the past 2 months: Marketing executive ([place] [facility])                                       Social Determinants of Health (SDOH) Interventions SDOH Screenings   Food Insecurity: No Food Insecurity (01/02/2023)  Housing: Low Risk  (01/02/2023)  Transportation Needs: No Transportation Needs (01/02/2023)  Utilities: Not At Risk (01/02/2023)  Alcohol Screen: Low Risk  (10/11/2020)  Depression (PHQ2-9): Low Risk  (09/01/2022)  Financial Resource Strain: Low Risk  (10/27/2021)  Physical Activity: Sufficiently Active (10/27/2021)  Social Connections: Moderately Integrated (10/27/2021)  Stress: No Stress Concern Present (10/27/2021)  Tobacco Use: Low Risk  (01/02/2023)    Readmission Risk Interventions    10/24/2022    9:08 AM  Readmission Risk Prevention Plan  Post Dischage Appt Complete  Medication Screening Complete  Transportation Screening Complete

## 2023-01-06 NOTE — Progress Notes (Signed)
Mobility Specialist Progress Note:    01/06/23 1609  Mobility  Activity Transferred to/from [facility];Transferred from bed to chair  Level of Assistance Minimal assist, patient does 75% or more (+2)  Assistive Device Other (Comment) (HHA)  Distance Ambulated (ft) 5 ft  Activity Response Tolerated well  Mobility Referral Yes  Mobility visit 1 Mobility  Mobility Specialist Start Time (ACUTE ONLY) 1500  Mobility Specialist Stop Time (ACUTE ONLY) 1522  Mobility Specialist Time Calculation (min) (ACUTE ONLY) 22 min   Pt received in chair agreeable to mobility. Was able to get pt to [facility] w/MinA and HHA. After sitting for a few minutes, attempted to stand patient with one person but pt was very unsteady needed MinA +2 for safety. Returned to bed w/o fault. Call bell and personal belongings in reach. Bed alarm on. All needs met.  Thompson Grayer Mobility Specialist  Please contact vis Secure Chat or  Rehab Office [phone]

## 2023-01-07 DIAGNOSIS — J9601 Acute respiratory failure with hypoxia: Secondary | ICD-10-CM | POA: Diagnosis not present

## 2023-01-07 LAB — BASIC METABOLIC PANEL
Anion gap: 4 — ABNORMAL LOW (ref 5–15)
BUN: 29 mg/dL — ABNORMAL HIGH (ref 8–23)
CO2: 26 mmol/L (ref 22–32)
Calcium: 8.1 mg/dL — ABNORMAL LOW (ref 8.9–10.3)
Chloride: 104 mmol/L (ref 98–111)
Creatinine, Ser: 1.39 mg/dL — ABNORMAL HIGH (ref 0.44–1.00)
GFR, Estimated: 37 mL/min — ABNORMAL LOW (ref >60)
Glucose, Bld: 103 mg/dL — ABNORMAL HIGH (ref 70–99)
Potassium: 4 mmol/L (ref 3.5–5.1)
Sodium: 134 mmol/L — ABNORMAL LOW (ref 135–145)

## 2023-01-07 MED ORDER — QUETIAPINE FUMARATE 25 MG PO TABS
25.0000 mg | ORAL_TABLET | Freq: Every evening | ORAL | Status: DC | PRN
  Administered 2023-01-09 – 2023-01-11 (×3): 25 mg via ORAL
  Filled 2023-01-07 (×3): qty 1

## 2023-01-07 MED ORDER — TRAMADOL HCL 50 MG PO TABS
50.0000 mg | ORAL_TABLET | Freq: Two times a day (BID) | ORAL | Status: DC | PRN
  Administered 2023-01-07 – 2023-01-08 (×3): 50 mg via ORAL
  Filled 2023-01-07 (×3): qty 1

## 2023-01-07 MED ORDER — SODIUM CHLORIDE 0.9 % IV SOLN
2.0000 g | INTRAVENOUS | Status: DC
  Administered 2023-01-07 – 2023-01-11 (×5): 2 g via INTRAVENOUS
  Filled 2023-01-07 (×5): qty 20

## 2023-01-07 MED ORDER — IRBESARTAN 75 MG PO TABS
75.0000 mg | ORAL_TABLET | Freq: Every day | ORAL | Status: DC
  Administered 2023-01-07 – 2023-01-10 (×4): 75 mg via ORAL
  Filled 2023-01-07 (×4): qty 1

## 2023-01-07 NOTE — Progress Notes (Signed)
[facility] PROGRESS NOTE    Progress Note  Brenda Matthews  ZOX:[id number] DOB: Sep 10, 1935 DOA: 01/02/2023 PCP: Jeoffrey Massed, MD     Brief Narrative:   Brenda Matthews is an 87 y.o. female past medical history of chronic atrial fibrillation on amiodarone and Eliquis, chronic diastolic heart failure, chronic kidney disease stage III, essential hypertension recently treated with antibiotics for her lower extremity nodule by PCP comes in for lower extremity swelling and shortness of breath that started on the night of admission. Chest x-ray was consistent with mild pulmonary edema, BNP in the thousands and  systolic blood pressure in the 200.  Initially needed BiPAP in the ED started on IV nitroglycerin we were able to wean her off BiPAP.  Assessment/Plan:  Hypertensive emergency/ Essential hypertension: Estimated dry weight  76 kg. Resume Avapro continue hydralazine Lasix and metoprolol. Pressure significantly improved. Her dose of metoprolol was decreased due to bradycardia we will continue current dose. PT evaluated the patient recommended skilled nursing facility. Try to wean to room air.  Acute respiratory failure with hypoxemia (HCC)/acute pulmonary edema in the setting of acute diastolic dysfunction: Has been weaned to room air. Out of bed to chair continues inspirometry try to wean to room air. Continue oral Lasix She is negative about 7 L Continue strict I's and O's and daily weights. Continue metoprolol and hydralazine. 2D echo shows an EF of 60% grade 1 diastolic dysfunction. Restart Avapro as her creatinine has returned to baseline  Right lower extremity cellulitis: Start IV Rocephin,Continue oral Doxy.  Acute kidney injury: Likely due to overdiuresis. Her creatinine has stabilized and improving.  Creatinine has returned to baseline. Baseline creatinine is around 1.3-1.5.  Hypokalemia: Now improved. Can resume Avapro. Check basic metabolic panel tomorrow  morning.  PAF (paroxysmal atrial fibrillation) (HCC) Rate controlled continue metoprolol, amiodarone and Eliquis. Try to keep potassium greater than 4 magnesium greater than 2. Became bradycardic overnight agree with lower dose of metoprolol.  Acute urinary retention Foley in place. Will give a voiding trial.   DVT prophylaxis: Eliquis Family Communication:none Status is: Inpatient Remains inpatient appropriate because: Hypertensive emergency    Code Status:     Code Status Orders  (From admission, onward)           Start     Ordered   01/02/23 1058  Full code  Continuous       Question:  By:  Answer:  Other   01/02/23 1058           Code Status History     Date Active Date Inactive Code Status Order ID Comments User Context   10/22/2022 2246 10/26/2022 1957 Full Code [id number]  Briscoe Deutscher, MD Inpatient   06/11/2022 1225 06/24/2022 1916 Full Code [id number]  Maryln Gottron, MD ED   09/14/2020 0945 09/20/2020 0204 Full Code [id number]  Marlow Baars, MD Inpatient   09/26/2018 1310 09/27/2018 1831 Full Code [id number]  Ranee Gosselin, MD Inpatient   08/29/2017 1438 09/11/2017 2152 Full Code [id number]  Gwenyth Bender, NP ED   02/18/2016 1952 02/20/2016 2005 Full Code [id number]  Toni Arthurs, MD Inpatient         IV Access:   Peripheral IV   Procedures and diagnostic studies:   No results found.    Medical Consultants:   None.   Subjective:    Brenda Matthews complaining of right lower extremity pain  Objective:    Vitals:   01/06/23 1516 01/06/23 2022 01/07/23  0052 01/07/23 0347  BP: (!) 145/79 (!) 134/55 (!) 117/47 (!) 141/60  Pulse: 72  71 80  Resp: (!) 24 (!) 27 (!) 27 (!) 28  Temp: 98.1 F (36.7 C) 98.2 F (36.8 C) 98.1 F (36.7 C) 98.2 F (36.8 C)  TempSrc: Oral Oral Oral Oral  SpO2: 91% 94% 92% (!) 88%  Weight:    76.6 kg  Height:       SpO2: (!) 88 % O2 Flow Rate (L/min): 1 L/min FiO2 (%): 40 %   Intake/Output Summary (Last 24  hours) at 01/07/2023 0924 Last data filed at 01/07/2023 0200 Gross per 24 hour  Intake 360 ml  Output 400 ml  Net -40 ml   Filed Weights   01/05/23 0329 01/06/23 0539 01/07/23 0347  Weight: 77.7 kg 76.7 kg 76.6 kg    Exam: General exam: In no acute distress. Respiratory system: Good air movement and clear to auscultation. Cardiovascular system: S1 & S2 heard, RRR. No JVD. Gastrointestinal system: Abdomen is nondistended, soft and nontender.  Extremities: Trace edema Skin: Right lower extremity cellulitis Psychiatry: Judgement and insight appear normal. Mood & affect appropriate.  Data Reviewed:    Labs: Basic Metabolic Panel: Recent Labs  Lab 01/03/23 0302 01/04/23 0312 01/05/23 0249 01/06/23 0317 01/07/23 0238  NA 139 141 136 142 134*  K 3.2* 3.7 4.4 5.1 4.0  CL 106 106 99 104 104  CO2 26 26 25 25 26   GLUCOSE 102* 117* 104* 131* 103*  BUN 24* 27* 32* 30* 29*  CREATININE 1.25* 1.49* 1.81* 1.63* 1.39*  CALCIUM 8.8* 9.0 9.4 9.0 8.1*   GFR Estimated Creatinine Clearance: 27.7 mL/min (A) (by C-G formula based on SCr of 1.39 mg/dL (H)). Liver Function Tests: Recent Labs  Lab 01/02/23 0855  AST 31  ALT 24  ALKPHOS 111  BILITOT 1.4*  PROT 6.3*  ALBUMIN 2.8*   No results for input(s): "LIPASE", "AMYLASE" in the last 168 hours. No results for input(s): "AMMONIA" in the last 168 hours. Coagulation profile No results for input(s): "INR", "PROTIME" in the last 168 hours. COVID-19 Labs  No results for input(s): "DDIMER", "FERRITIN", "LDH", "CRP" in the last 72 hours.   Lab Results  Component Value Date   SARSCOV2NAA NEGATIVE 01/02/2023   SARSCOV2NAA NEGATIVE 10/22/2022   SARSCOV2NAA NEGATIVE 09/19/2020   SARSCOV2NAA NEGATIVE 09/14/2020    CBC: Recent Labs  Lab 01/02/23 0855 01/02/23 0907  WBC 9.8  --   NEUTROABS 7.8*  --   HGB 10.3* 10.9*  HCT 34.0* 32.0*  MCV 96.3  --   PLT 261  --    Cardiac Enzymes: No results for input(s): "CKTOTAL", "CKMB",  "CKMBINDEX", "TROPONINI" in the last 168 hours. BNP (last 3 results) No results for input(s): "PROBNP" in the last 8760 hours. CBG: No results for input(s): "GLUCAP" in the last 168 hours. D-Dimer: No results for input(s): "DDIMER" in the last 72 hours. Hgb A1c: No results for input(s): "HGBA1C" in the last 72 hours. Lipid Profile: No results for input(s): "CHOL", "HDL", "LDLCALC", "TRIG", "CHOLHDL", "LDLDIRECT" in the last 72 hours. Thyroid function studies: No results for input(s): "TSH", "T4TOTAL", "T3FREE", "THYROIDAB" in the last 72 hours.  Invalid input(s): "FREET3" Anemia work up: No results for input(s): "VITAMINB12", "FOLATE", "FERRITIN", "TIBC", "IRON", "RETICCTPCT" in the last 72 hours. Sepsis Labs: Recent Labs  Lab 01/02/23 0855 01/02/23 1055  PROCALCITON  --  <0.10  WBC 9.8  --    Microbiology Recent Results (from the past 240 hours)  SARS Coronavirus 2 by RT PCR (hospital order, performed in [facility] hospital lab) *cepheid single result test* Anterior Nasal Swab     Status: None   Collection Time: 01/02/23  9:13 AM   Specimen: Anterior Nasal Swab  Result Value Ref Range Status   SARS Coronavirus 2 by RT PCR NEGATIVE NEGATIVE Final    Comment: Performed at [facility], [address], [place], [place] [address]  Respiratory (~20 pathogens) panel by PCR     Status: None   Collection Time: 01/02/23  5:36 PM   Specimen: Nasopharyngeal Swab; Respiratory  Result Value Ref Range Status   Adenovirus NOT DETECTED NOT DETECTED Final   Coronavirus 229E NOT DETECTED NOT DETECTED Final    Comment: (NOTE) The Coronavirus on the Respiratory Panel, DOES NOT test for the novel  Coronavirus (2019 nCoV)    Coronavirus HKU1 NOT DETECTED NOT DETECTED Final   Coronavirus NL63 NOT DETECTED NOT DETECTED Final   Coronavirus OC43 NOT DETECTED NOT DETECTED Final   Metapneumovirus NOT DETECTED NOT DETECTED Final   Rhinovirus / Enterovirus NOT DETECTED NOT DETECTED Final    Influenza A NOT DETECTED NOT DETECTED Final   Influenza B NOT DETECTED NOT DETECTED Final   Parainfluenza Virus 1 NOT DETECTED NOT DETECTED Final   Parainfluenza Virus 2 NOT DETECTED NOT DETECTED Final   Parainfluenza Virus 3 NOT DETECTED NOT DETECTED Final   Parainfluenza Virus 4 NOT DETECTED NOT DETECTED Final   Respiratory Syncytial Virus NOT DETECTED NOT DETECTED Final   Bordetella pertussis NOT DETECTED NOT DETECTED Final   Bordetella Parapertussis NOT DETECTED NOT DETECTED Final   Chlamydophila pneumoniae NOT DETECTED NOT DETECTED Final   Mycoplasma pneumoniae NOT DETECTED NOT DETECTED Final    Comment: Performed at [facility], [address], [place], [place] [address]     Medications:    amiodarone  200 mg Oral Daily   apixaban  5 mg Oral BID   Chlorhexidine Gluconate Cloth  6 each Topical Daily   doxycycline  100 mg Oral Q12H   empagliflozin  10 mg Oral Daily   furosemide  40 mg Oral Daily   hydrALAZINE  25 mg Oral Q6H   hydrocortisone cream   Topical TID   irbesartan  75 mg Oral Daily   metoprolol tartrate  25 mg Oral BID   sodium chloride flush  3 mL Intravenous Q12H   Continuous Infusions:       LOS: 5 days   Marinda Elk  [facility]  01/07/2023, 9:24 AM

## 2023-01-07 NOTE — Progress Notes (Signed)
Pt requested a purwick so that she wouldn't have to get up due to her feet and legs hurting so bad this Clinical research associate applied a purwick patient has not voided since placement will continue to monitor for output. Family currently at bedside pt seems to be happy she is here.

## 2023-01-07 NOTE — Progress Notes (Signed)
At 0340, pt began self-removing TED hose, insisting that they are too painful to wear on her legs. She stated that she would tell the doctor herself that she could not tolerate them. She refused to get back in bed from the bedside commode until or unless they were removed. This was done, and the pt was settled back in bed.

## 2023-01-07 NOTE — Progress Notes (Signed)
Mobility Specialist Progress Note:    01/07/23 1206  Mobility  Activity Transferred to/from [facility]  Level of Assistance Minimal assist, patient does 75% or more (+2)  Assistive Device Other (Comment) (HHA)  Distance Ambulated (ft) 4 ft  Activity Response Tolerated well  Mobility Referral Yes  Mobility visit 1 Mobility  Mobility Specialist Start Time (ACUTE ONLY) 0950  Mobility Specialist Stop Time (ACUTE ONLY) 1005  Mobility Specialist Time Calculation (min) (ACUTE ONLY) 15 min   Pt received in bed agreeable to mobility. Pt transferred to the [facility] with MinA +2, HHA, void complete. To stand and transfer back to bed pt was weaker, trying to prematurely sit. Situated in bed w/ call bell and personal belongings in reach. All needs met. RN in room.   Thompson Grayer Mobility Specialist  Please contact vis Secure Chat or  Rehab Office [phone]

## 2023-01-07 NOTE — Progress Notes (Signed)
   01/07/23 2041  BiPAP/CPAP/SIPAP  BiPAP/CPAP/SIPAP Pt Type Adult  Reason BIPAP/CPAP not in use Non-compliant

## 2023-01-07 NOTE — Progress Notes (Signed)
Pt called out to go to the restroom this Clinical research associate and the nurse helped pt to bed side commode where she had a bowel movement the pt struggled to get back to bed from bedside due to her feet and legs hurting. Pt placed back in bed and given her hot tea

## 2023-01-08 DIAGNOSIS — J9601 Acute respiratory failure with hypoxia: Secondary | ICD-10-CM | POA: Diagnosis not present

## 2023-01-08 LAB — BASIC METABOLIC PANEL
Anion gap: 8 (ref 5–15)
BUN: 28 mg/dL — ABNORMAL HIGH (ref 8–23)
CO2: 28 mmol/L (ref 22–32)
Calcium: 8.7 mg/dL — ABNORMAL LOW (ref 8.9–10.3)
Chloride: 101 mmol/L (ref 98–111)
Creatinine, Ser: 1.42 mg/dL — ABNORMAL HIGH (ref 0.44–1.00)
GFR, Estimated: 36 mL/min — ABNORMAL LOW (ref >60)
Glucose, Bld: 98 mg/dL (ref 70–99)
Potassium: 3.8 mmol/L (ref 3.5–5.1)
Sodium: 137 mmol/L (ref 135–145)

## 2023-01-08 MED ORDER — FUROSEMIDE 40 MG PO TABS
40.0000 mg | ORAL_TABLET | Freq: Two times a day (BID) | ORAL | Status: AC
  Administered 2023-01-08 (×2): 40 mg via ORAL
  Filled 2023-01-08 (×2): qty 1

## 2023-01-08 MED ORDER — POLYETHYLENE GLYCOL 3350 17 G PO PACK
17.0000 g | PACK | Freq: Two times a day (BID) | ORAL | Status: AC
  Administered 2023-01-08 – 2023-01-09 (×4): 17 g via ORAL
  Filled 2023-01-08 (×4): qty 1

## 2023-01-08 MED ORDER — FUROSEMIDE 40 MG PO TABS
40.0000 mg | ORAL_TABLET | Freq: Every day | ORAL | Status: DC
  Administered 2023-01-09 – 2023-01-10 (×2): 40 mg via ORAL
  Filled 2023-01-08 (×2): qty 1

## 2023-01-08 NOTE — Progress Notes (Signed)
[facility] PROGRESS NOTE    Progress Note  Brenda Matthews  ZOX:[id number] DOB: 03/27/1935 DOA: 01/02/2023 PCP: Brenda Massed, MD     Brief Narrative:   Brenda Matthews is an 87 y.o. female past medical history of chronic atrial fibrillation on amiodarone and Eliquis, chronic diastolic heart failure, chronic kidney disease stage III, essential hypertension recently treated with antibiotics for her lower extremity nodule by PCP comes in for lower extremity swelling and shortness of breath that started on the night of admission. Chest x-ray was consistent with mild pulmonary edema, BNP in the thousands and  systolic blood pressure in the 200.  Initially needed BiPAP in the ED started on IV nitroglycerin we were able to wean her off BiPAP.  Assessment/Plan:  Hypertensive emergency/ Essential hypertension: Avapro, hydralazine Lasix and metoprolol. Pressure significantly improved. Her dose of metoprolol was decreased due to bradycardia we will continue current dose. PT evaluated the patient recommended skilled nursing facility. Try to wean to room air.  Acute respiratory failure with hypoxemia (HCC)/acute pulmonary edema in the setting of acute diastolic dysfunction: Out of bed to chair continues inspirometry try to wean to room air. Continue oral Lasix She is negative about 8L. Continue strict I's and O's and daily weights. Continue metoprolol and hydralazine. 2D echo shows an EF of 60% grade 1 diastolic dysfunction. Restart Avapro as her creatinine has returned to baseline She will go home on metoprolol Lasix hydralazine and Avapro  Right lower extremity cellulitis: Erythematous on better still warm to touch but she relates she feels much better. I have demarcated the area. Continue IV Rocephin and Doxy, has remained afebrile.  Acute kidney injury: Likely due to overdiuresis. Her creatinine has stabilized and improving.  Creatinine has returned to baseline. Baseline  creatinine is around 1.3-1.5.  Hypokalemia: Now improved. Improved with oral repletion.  PAF (paroxysmal atrial fibrillation) (HCC) Rate controlled continue metoprolol, amiodarone and Eliquis. Try to keep potassium greater than 4 magnesium greater than 2. Became bradycardic overnight agree with lower dose of metoprolol.  Acute urinary retention Foley has been discontinued.  Constipation: MiraLAX p.o. twice daily   DVT prophylaxis: Eliquis Family Communication:none Status is: Inpatient Remains inpatient appropriate because: Hypertensive emergency    Code Status:     Code Status Orders  (From admission, onward)           Start     Ordered   01/02/23 1058  Full code  Continuous       Question:  By:  Answer:  Other   01/02/23 1058           Code Status History     Date Active Date Inactive Code Status Order ID Comments User Context   10/22/2022 2246 10/26/2022 1957 Full Code [id number]  Brenda Deutscher, MD Inpatient   06/11/2022 1225 06/24/2022 1916 Full Code [id number]  Brenda Gottron, MD ED   09/14/2020 0945 09/20/2020 0204 Full Code [id number]  Brenda Baars, MD Inpatient   09/26/2018 1310 09/27/2018 1831 Full Code [id number]  Brenda Gosselin, MD Inpatient   08/29/2017 1438 09/11/2017 2152 Full Code [id number]  Brenda Bender, NP ED   02/18/2016 1952 02/20/2016 2005 Full Code [id number]  Brenda Arthurs, MD Inpatient         IV Access:   Peripheral IV   Procedures and diagnostic studies:   No results found.    Medical Consultants:   None.   Subjective:    Brenda Matthews lower extremity pain is better.  Objective:    Vitals:   01/08/23 0043 01/08/23 0129 01/08/23 0513 01/08/23 0715  BP: (!) 131/56  (!) 134/56 (!) 126/48  Pulse:   70 66  Resp:  (!) 21 (!) 21 (!) 22  Temp:   98.3 F (36.8 C) 97.6 F (36.4 C)  TempSrc:   Oral Oral  SpO2:   94% 98%  Weight:      Height:       SpO2: 98 % O2 Flow Rate (L/min): 2 L/min FiO2 (%): 40  %   Intake/Output Summary (Last 24 hours) at 01/08/2023 0900 Last data filed at 01/08/2023 0732 Gross per 24 hour  Intake 1080 ml  Output 1950 ml  Net -870 ml   Filed Weights   01/05/23 0329 01/06/23 0539 01/07/23 0347  Weight: 77.7 kg 76.7 kg 76.6 kg    Exam: General exam: In no acute distress. Respiratory system: Good air movement and clear to auscultation. Cardiovascular system: S1 & S2 heard, RRR. No JVD. Gastrointestinal system: Abdomen is nondistended, soft and nontender.  Extremities: No pedal edema. Skin: No rashes, lesions or ulcers Psychiatry: Judgement and insight appear normal. Mood & affect appropriate.  Data Reviewed:    Labs: Basic Metabolic Panel: Recent Labs  Lab 01/04/23 0312 01/05/23 0249 01/06/23 0317 01/07/23 0238 01/08/23 0233  NA 141 136 142 134* 137  K 3.7 4.4 5.1 4.0 3.8  CL 106 99 104 104 101  CO2 26 25 25 26 28   GLUCOSE 117* 104* 131* 103* 98  BUN 27* 32* 30* 29* 28*  CREATININE 1.49* 1.81* 1.63* 1.39* 1.42*  CALCIUM 9.0 9.4 9.0 8.1* 8.7*   GFR Estimated Creatinine Clearance: 27.1 mL/min (A) (by C-G formula based on SCr of 1.42 mg/dL (H)). Liver Function Tests: Recent Labs  Lab 01/02/23 0855  AST 31  ALT 24  ALKPHOS 111  BILITOT 1.4*  PROT 6.3*  ALBUMIN 2.8*   No results for input(s): "LIPASE", "AMYLASE" in the last 168 hours. No results for input(s): "AMMONIA" in the last 168 hours. Coagulation profile No results for input(s): "INR", "PROTIME" in the last 168 hours. COVID-19 Labs  No results for input(s): "DDIMER", "FERRITIN", "LDH", "CRP" in the last 72 hours.   Lab Results  Component Value Date   SARSCOV2NAA NEGATIVE 01/02/2023   SARSCOV2NAA NEGATIVE 10/22/2022   SARSCOV2NAA NEGATIVE 09/19/2020   SARSCOV2NAA NEGATIVE 09/14/2020    CBC: Recent Labs  Lab 01/02/23 0855 01/02/23 0907  WBC 9.8  --   NEUTROABS 7.8*  --   HGB 10.3* 10.9*  HCT 34.0* 32.0*  MCV 96.3  --   PLT 261  --    Cardiac Enzymes: No  results for input(s): "CKTOTAL", "CKMB", "CKMBINDEX", "TROPONINI" in the last 168 hours. BNP (last 3 results) No results for input(s): "PROBNP" in the last 8760 hours. CBG: No results for input(s): "GLUCAP" in the last 168 hours. D-Dimer: No results for input(s): "DDIMER" in the last 72 hours. Hgb A1c: No results for input(s): "HGBA1C" in the last 72 hours. Lipid Profile: No results for input(s): "CHOL", "HDL", "LDLCALC", "TRIG", "CHOLHDL", "LDLDIRECT" in the last 72 hours. Thyroid function studies: No results for input(s): "TSH", "T4TOTAL", "T3FREE", "THYROIDAB" in the last 72 hours.  Invalid input(s): "FREET3" Anemia work up: No results for input(s): "VITAMINB12", "FOLATE", "FERRITIN", "TIBC", "IRON", "RETICCTPCT" in the last 72 hours. Sepsis Labs: Recent Labs  Lab 01/02/23 0855 01/02/23 1055  PROCALCITON  --  <0.10  WBC 9.8  --    Microbiology Recent Results (from the  past 240 hours)  SARS Coronavirus 2 by RT PCR (hospital order, performed in [facility] hospital lab) *cepheid single result test* Anterior Nasal Swab     Status: None   Collection Time: 01/02/23  9:13 AM   Specimen: Anterior Nasal Swab  Result Value Ref Range Status   SARS Coronavirus 2 by RT PCR NEGATIVE NEGATIVE Final    Comment: Performed at [facility], [address] [place], [place] [address]  Respiratory (~20 pathogens) panel by PCR     Status: None   Collection Time: 01/02/23  5:36 PM   Specimen: Nasopharyngeal Swab; Respiratory  Result Value Ref Range Status   Adenovirus NOT DETECTED NOT DETECTED Final   Coronavirus 229E NOT DETECTED NOT DETECTED Final    Comment: (NOTE) The Coronavirus on the Respiratory Panel, DOES NOT test for the novel  Coronavirus (2019 nCoV)    Coronavirus HKU1 NOT DETECTED NOT DETECTED Final   Coronavirus NL63 NOT DETECTED NOT DETECTED Final   Coronavirus OC43 NOT DETECTED NOT DETECTED Final   Metapneumovirus NOT DETECTED NOT DETECTED Final   Rhinovirus /  Enterovirus NOT DETECTED NOT DETECTED Final   Influenza A NOT DETECTED NOT DETECTED Final   Influenza B NOT DETECTED NOT DETECTED Final   Parainfluenza Virus 1 NOT DETECTED NOT DETECTED Final   Parainfluenza Virus 2 NOT DETECTED NOT DETECTED Final   Parainfluenza Virus 3 NOT DETECTED NOT DETECTED Final   Parainfluenza Virus 4 NOT DETECTED NOT DETECTED Final   Respiratory Syncytial Virus NOT DETECTED NOT DETECTED Final   Bordetella pertussis NOT DETECTED NOT DETECTED Final   Bordetella Parapertussis NOT DETECTED NOT DETECTED Final   Chlamydophila pneumoniae NOT DETECTED NOT DETECTED Final   Mycoplasma pneumoniae NOT DETECTED NOT DETECTED Final    Comment: Performed at [facility], [address], [place], [place] [address]     Medications:    amiodarone  200 mg Oral Daily   apixaban  5 mg Oral BID   Chlorhexidine Gluconate Cloth  6 each Topical Daily   doxycycline  100 mg Oral Q12H   empagliflozin  10 mg Oral Daily   furosemide  40 mg Oral Daily   hydrALAZINE  25 mg Oral Q6H   hydrocortisone cream   Topical TID   irbesartan  75 mg Oral Daily   metoprolol tartrate  25 mg Oral BID   sodium chloride flush  3 mL Intravenous Q12H   Continuous Infusions:  cefTRIAXone (ROCEPHIN)  IV 2 g (01/07/23 1129)        LOS: 6 days   Marinda Elk  [facility]  01/08/2023, 9:00 AM

## 2023-01-08 NOTE — Plan of Care (Signed)
  Problem: Education: Goal: Knowledge of General Education information will improve Description: Including pain rating scale, medication(s)/side effects and non-pharmacologic comfort measures Outcome: Progressing   Problem: Coping: Goal: Level of anxiety will decrease Outcome: Progressing   Problem: Safety: Goal: Ability to remain free from injury will improve Outcome: Progressing   

## 2023-01-08 NOTE — Plan of Care (Signed)
  Problem: Education: Goal: Knowledge of General Education information will improve Description: Including pain rating scale, medication(s)/side effects and non-pharmacologic comfort measures Outcome: Progressing   Problem: Health Behavior/Discharge Planning: Goal: Ability to manage health-related needs will improve Outcome: Progressing   Problem: Clinical Measurements: Goal: Ability to maintain clinical measurements within normal limits will improve Outcome: Progressing Goal: Diagnostic test results will improve Outcome: Progressing Goal: Respiratory complications will improve Outcome: Progressing Goal: Cardiovascular complication will be avoided Outcome: Progressing   Problem: Activity: Goal: Risk for activity intolerance will decrease Outcome: Progressing   Problem: Coping: Goal: Level of anxiety will decrease Outcome: Progressing   Problem: Elimination: Goal: Will not experience complications related to bowel motility Outcome: Progressing Goal: Will not experience complications related to urinary retention Outcome: Progressing   Problem: Safety: Goal: Ability to remain free from injury will improve Outcome: Progressing   Problem: Skin Integrity: Goal: Risk for impaired skin integrity will decrease Outcome: Progressing

## 2023-01-09 DIAGNOSIS — J9601 Acute respiratory failure with hypoxia: Secondary | ICD-10-CM | POA: Diagnosis not present

## 2023-01-09 DIAGNOSIS — E876 Hypokalemia: Secondary | ICD-10-CM | POA: Insufficient documentation

## 2023-01-09 DIAGNOSIS — L03115 Cellulitis of right lower limb: Secondary | ICD-10-CM

## 2023-01-09 DIAGNOSIS — K5900 Constipation, unspecified: Secondary | ICD-10-CM | POA: Insufficient documentation

## 2023-01-09 DIAGNOSIS — I480 Paroxysmal atrial fibrillation: Secondary | ICD-10-CM | POA: Diagnosis not present

## 2023-01-09 MED ORDER — OXYCODONE HCL 5 MG PO TABS
5.0000 mg | ORAL_TABLET | ORAL | Status: DC | PRN
  Administered 2023-01-09 – 2023-01-11 (×7): 5 mg via ORAL
  Filled 2023-01-09 (×7): qty 1

## 2023-01-09 MED ORDER — MORPHINE SULFATE (PF) 2 MG/ML IV SOLN
2.0000 mg | INTRAVENOUS | Status: DC | PRN
  Administered 2023-01-09 – 2023-01-11 (×3): 2 mg via INTRAVENOUS
  Filled 2023-01-09 (×3): qty 1

## 2023-01-09 MED ORDER — ACETAMINOPHEN 500 MG PO TABS
1000.0000 mg | ORAL_TABLET | Freq: Four times a day (QID) | ORAL | Status: DC
  Administered 2023-01-09 – 2023-01-11 (×7): 1000 mg via ORAL
  Filled 2023-01-09 (×8): qty 2

## 2023-01-09 NOTE — Progress Notes (Signed)
Mobility Specialist Progress Note:   01/09/23 1607  Mobility  Activity  (bed mobility)  Level of Assistance Moderate assist, patient does 50-74%  Assistive Device None  Activity Response Tolerated fair  Mobility Referral Yes  Mobility visit 1 Mobility  Mobility Specialist Start Time (ACUTE ONLY) 1532  Mobility Specialist Stop Time (ACUTE ONLY) 1545  Mobility Specialist Time Calculation (min) (ACUTE ONLY) 13 min   Pt received in bed hesitant but agreeable to mobility. Attempted to get pt to sit EOB. Pt was able to move LLE better than RLE. Pt unable to proceed further d/t strong BLE pain. Situated in bed w/ call bell and personal belongings in reach. All needs met.   Thompson Grayer Mobility Specialist  Please contact vis Secure Chat or  Rehab Office [phone]

## 2023-01-09 NOTE — Plan of Care (Signed)
  Problem: Education: Goal: Knowledge of General Education information will improve Description: Including pain rating scale, medication(s)/side effects and non-pharmacologic comfort measures Outcome: Progressing   Problem: Coping: Goal: Level of anxiety will decrease Outcome: Progressing   

## 2023-01-09 NOTE — Hospital Course (Signed)
[name] was admitted to the hospital with the working diagnosis of right lower extremity cellulitis in the setting of heart failure decompensation.   87 y.o. female past medical history of chronic atrial fibrillation on amiodarone and Eliquis, chronic diastolic heart failure, chronic kidney disease stage III, essential hypertension recently treated with antibiotics for her lower extremity nodule by PCP comes in for lower extremity swelling and shortness of breath that started on the night of admission.  Chest x-ray was consistent with mild pulmonary edema, BNP in the thousands and  systolic blood pressure in the 200.  Initially needed BiPAP in the ED started on IV nitroglycerin we were able to wean her off BiPAP.   12/25 continue to have right leg pain. She required naloxone yesterday due to oversedation.

## 2023-01-09 NOTE — Progress Notes (Signed)
Progress Note    Brenda Matthews   EAV:[id number]  DOB: [birth year]  DOA: 01/02/2023     7 PCP: Jeoffrey Massed, MD  Initial CC: SOB, right leg pain/redness  Hospital Course: Brenda Matthews is an 87 y.o. female past medical history of chronic atrial fibrillation on amiodarone and Eliquis, chronic diastolic heart failure, chronic kidney disease stage III, essential hypertension recently treated with antibiotics for her lower extremity nodule by PCP comes in for lower extremity swelling and shortness of breath that started on the night of admission.  Chest x-ray was consistent with mild pulmonary edema, BNP in the thousands and  systolic blood pressure in the 200.  Initially needed BiPAP in the ED started on IV nitroglycerin we were able to wean her off BiPAP.   Interval History:  Patient appearing uncomfortable this morning.  Still complaining of pain in her right leg notably her right foot.  Erythema has improved some but still present.  Still has palpable nodules on legs as well.  Assessment and Plan:  Hypertensive emergency - resolved  Essential hypertension Avapro, hydralazine Lasix and metoprolol. Her dose of metoprolol was decreased due to bradycardia we will continue current dose. PT evaluated the patient recommended skilled nursing facility.   Acute respiratory failure with hypoxemia (HCC)/acute pulmonary edema in the setting of acute diastolic dysfunction: Out of bed to chair continues inspirometry try to wean to room air Continue oral Lasix Continue strict I's and O's and daily weights. Continue metoprolol and hydralazine 2D echo shows an EF of 60% grade 1 diastolic dysfunction continue Avapro as her creatinine has returned to baseline She will go home on metoprolol Lasix hydralazine and Avapro   Right lower extremity cellulitis -Improving erythema but still present.  Still has palpable nodules at areas of erythema -Ongoing significant tenderness in right dorsum of  foot Continue IV Rocephin and Doxy   Acute kidney injury Her creatinine has stabilized and improving.  Creatinine has returned to baseline. Baseline creatinine is around 1.3-1.5.   Hypokalemia -Replete as needed   PAF (paroxysmal atrial fibrillation) (HCC) Rate controlled continue metoprolol, amiodarone and Eliquis.   Acute urinary retention Foley has been discontinued.   Constipation: MiraLAX p.o. twice daily   Old records reviewed in assessment of this patient  Antimicrobials: Doxycycline 01/06/2023 >> current Rocephin 01/07/2023 >> current  DVT prophylaxis:  Place TED hose Start: 01/06/23 1242 apixaban (ELIQUIS) tablet 5 mg   Code Status:   Code Status: Full Code  Mobility Assessment (Last 72 Hours)     Mobility Assessment     Row Name 01/09/23 0400 01/09/23 0024 01/08/23 2040 01/08/23 0900 01/07/23 2300   Does patient have an order for bedrest or is patient medically unstable No - Continue assessment No - Continue assessment No - Continue assessment No - Continue assessment No - Continue assessment   What is the highest level of mobility based on the progressive mobility assessment? Level 4 (Walks with assist in room) - Balance while marching in place and cannot step forward and back - Complete Level 4 (Walks with assist in room) - Balance while marching in place and cannot step forward and back - Complete Level 4 (Walks with assist in room) - Balance while marching in place and cannot step forward and back - Complete Level 4 (Walks with assist in room) - Balance while marching in place and cannot step forward and back - Complete Level 4 (Walks with assist in room) - Balance while marching in place  and cannot step forward and back - Complete   Is the above level different from baseline mobility prior to current illness? Yes - Recommend PT order Yes - Recommend PT order Yes - Recommend PT order Yes - Recommend PT order Yes - Recommend PT order    Row Name 01/07/23 0900  01/06/23 2022 01/06/23 1540       Does patient have an order for bedrest or is patient medically unstable No - Continue assessment No - Continue assessment --     What is the highest level of mobility based on the progressive mobility assessment? Level 4 (Walks with assist in room) - Balance while marching in place and cannot step forward and back - Complete Level 3 (Stands with assist) - Balance while standing  and cannot march in place Level 3 (Stands with assist) - Balance while standing  and cannot march in place     Is the above level different from baseline mobility prior to current illness? Yes - Recommend PT order -- --              Barriers to discharge: none Disposition Plan:  SNF Status is: Inpt  Objective: Blood pressure (!) 106/53, pulse (!) 59, temperature (!) 97.5 F (36.4 C), temperature source Oral, resp. rate 20, height 5\' 1"  (1.549 m), weight 74.2 kg, SpO2 90%.  Examination:  Physical Exam Constitutional:      Comments: Uncomfortable appearing from pain in her legs notably right foot  HENT:     Head: Normocephalic and atraumatic.     Mouth/Throat:     Mouth: Mucous membranes are moist.  Eyes:     Extraocular Movements: Extraocular movements intact.  Cardiovascular:     Rate and Rhythm: Normal rate and regular rhythm.  Pulmonary:     Effort: Pulmonary effort is normal. No respiratory distress.     Breath sounds: Normal breath sounds. No wheezing.  Abdominal:     General: Bowel sounds are normal. There is no distension.     Palpations: Abdomen is soft.     Tenderness: There is no abdominal tenderness.  Musculoskeletal:        General: Normal range of motion.     Cervical back: Normal range of motion and neck supple.  Skin:    General: Skin is warm and dry.  Neurological:     General: No focal deficit present.     Mental Status: She is alert.  Psychiatric:        Mood and Affect: Mood normal.      Consultants:    Procedures:    Data  Reviewed: No results found for this or any previous visit (from the past 24 hours).  I have reviewed pertinent nursing notes, vitals, labs, and images as necessary. I have ordered labwork to follow up on as indicated.  I have reviewed the last notes from staff over past 24 hours. I have discussed patient's care plan and test results with nursing staff, CM/SW, and other staff as appropriate.  Time spent: Greater than 50% of the 55 minute visit was spent in counseling/coordination of care for the patient as laid out in the A&P.   LOS: 7 days   Lewie Chamber, MD [facility] 01/09/2023, 1:09 PM

## 2023-01-09 NOTE — Progress Notes (Signed)
Physical Therapy Treatment Patient Details Name: Brenda Matthews MRN: [id number] DOB: Feb 10, 1935 Today's Date: 01/09/2023   History of Present Illness Brenda Matthews is an 87 y.o. female admitted for lower extremity swelling and SOB 12/16. Chest x-ray was consistent with mild pulmonary edema PMH: chronic afib on amiodarone and Eliquis, chronic diastolic heart failure, CKD stage III, essential hypertension recently treated with antibiotics for her lower extremity nodule by PCP.    PT Comments  Patient resting in bed and having difficulty eating. Agreeable to sit up to complete meals; pt completed supine>sit with min assist and cues for use of bed features/sequencing. Pt required set up assist for meal and despite encouragement pt declined to transfer to recliner for OOB. Pt returned to supine and repositioned for comfort. Pt continues to experience heightened emotions but is easily calmed. Will continue to progress as able.    If plan is discharge home, recommend the following: A little help with walking and/or transfers;A little help with bathing/dressing/bathroom   Can travel by private vehicle     Yes  Equipment Recommendations  None recommended by PT    Recommendations for Other Services OT consult     Precautions / Restrictions Precautions Precautions: Fall Restrictions Weight Bearing Restrictions Per Provider Order: No     Mobility  Bed Mobility Overal bed mobility: Needs Assistance Bed Mobility: Supine to Sit, Sit to Supine     Supine to sit: Min assist, HOB elevated, Used rails Sit to supine: Min assist, HOB elevated, Used rails   General bed mobility comments: cues for sequence using bed features. Min assist to raise trunk and bring Le's off/on EOB.    Transfers                   General transfer comment: pt declined OOB despite encouragement. pt reporting fatigue and desire to return to bed for sleep.    Ambulation/Gait                   Stairs              Wheelchair Mobility     Tilt Bed    Modified Rankin (Stroke Patients Only)       Balance Overall balance assessment: Needs assistance Sitting-balance support: Feet supported, No upper extremity supported Sitting balance-Leahy Scale: Fair     Standing balance support: During functional activity, Reliant on assistive device for balance, Bilateral upper extremity supported Standing balance-Leahy Scale: Poor                              Cognition Arousal: Alert Behavior During Therapy: Anxious Overall Cognitive Status: Impaired/Different from baseline                                 General Comments: pt remains emotional, pt stating "I don't want you to think I have dementia because I said it was Tuesday and not Monday". pt easily calmed.        Exercises      General Comments        Pertinent Vitals/Pain Pain Assessment Faces Pain Scale: Hurts little more Pain Location: bil LEs Pain Descriptors / Indicators: Aching, Discomfort    Home Living                          Prior Function  PT Goals (current goals can now be found in the care plan section) Acute Rehab PT Goals Patient Stated Goal: back to ILF PT Goal Formulation: With patient Time For Goal Achievement: 01/17/23 Potential to Achieve Goals: Good Progress towards PT goals: Progressing toward goals    Frequency    Min 1X/week      PT Plan      Co-evaluation              AM-PAC PT "6 Clicks" Mobility   Outcome Measure  Help needed turning from your back to your side while in a flat bed without using bedrails?: A Little Help needed moving from lying on your back to sitting on the side of a flat bed without using bedrails?: A Little Help needed moving to and from a bed to a chair (including a wheelchair)?: A Lot Help needed standing up from a chair using your arms (e.g., wheelchair or bedside chair)?: A Lot Help needed to  walk in hospital room?: Total Help needed climbing 3-5 steps with a railing? : A Lot 6 Click Score: 13    End of Session Equipment Utilized During Treatment: Gait belt Activity Tolerance: Patient tolerated treatment well Patient left: in chair;with call bell/phone within reach;with chair alarm set Nurse Communication: Mobility status PT Visit Diagnosis: Unsteadiness on feet (R26.81);Muscle weakness (generalized) (M62.81)     Time: 1610-9604 PT Time Calculation (min) (ACUTE ONLY): 23 min  Charges:    $Therapeutic Activity: 23-37 mins PT General Charges $$ ACUTE PT VISIT: 1 Visit                     Wynn Maudlin, DPT Acute Rehabilitation Services Office [phone]  01/09/23 5:22 PM

## 2023-01-09 NOTE — TOC Progression Note (Signed)
Transition of Care [facility]) - Progression Note    Patient Details  Name: Brenda Matthews MRN: [id number] Date of Birth: 01-04-1936  [facility] CM/SW Contact  Michaela Corner, [place] Phone Number: 01/09/2023, 1:28 PM  Clinical Narrative:   Per MD, pt not medically stable for DC. Will need to submit for ins auth if pt does not dc to facility tomorrow by 11:59PM.    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  Expected Discharge Plan and Services In-house Referral: Clinical Social Work     Living arrangements for the past 2 months: Marketing executive ([place])                                       Social Determinants of Health (SDOH) Interventions SDOH Screenings   Food Insecurity: No Food Insecurity (01/02/2023)  Housing: Low Risk  (01/02/2023)  Transportation Needs: No Transportation Needs (01/02/2023)  Utilities: Not At Risk (01/02/2023)  Alcohol Screen: Low Risk  (10/11/2020)  Depression (PHQ2-9): Low Risk  (09/01/2022)  Financial Resource Strain: Low Risk  (10/27/2021)  Physical Activity: Sufficiently Active (10/27/2021)  Social Connections: Moderately Integrated (10/27/2021)  Stress: No Stress Concern Present (10/27/2021)  Tobacco Use: Low Risk  (01/02/2023)    Readmission Risk Interventions    10/24/2022    9:08 AM  Readmission Risk Prevention Plan  Post Dischage Appt Complete  Medication Screening Complete  Transportation Screening Complete

## 2023-01-10 DIAGNOSIS — L03115 Cellulitis of right lower limb: Secondary | ICD-10-CM | POA: Diagnosis not present

## 2023-01-10 DIAGNOSIS — J9601 Acute respiratory failure with hypoxia: Secondary | ICD-10-CM | POA: Diagnosis not present

## 2023-01-10 DIAGNOSIS — I5033 Acute on chronic diastolic (congestive) heart failure: Secondary | ICD-10-CM | POA: Diagnosis not present

## 2023-01-10 LAB — BLOOD GAS, ARTERIAL
Acid-Base Excess: 2.1 mmol/L — ABNORMAL HIGH (ref 0.0–2.0)
Bicarbonate: 28.7 mmol/L — ABNORMAL HIGH (ref 20.0–28.0)
O2 Saturation: 98.2 %
Patient temperature: 37.4
pCO2 arterial: 53 mm[Hg] — ABNORMAL HIGH (ref 32–48)
pH, Arterial: 7.34 — ABNORMAL LOW (ref 7.35–7.45)
pO2, Arterial: 94 mm[Hg] (ref 83–108)

## 2023-01-10 MED ORDER — SODIUM CHLORIDE 0.9 % IV BOLUS
500.0000 mL | Freq: Once | INTRAVENOUS | Status: AC | PRN
  Administered 2023-01-10: 500 mL via INTRAVENOUS

## 2023-01-10 MED ORDER — NALOXONE HCL 0.4 MG/ML IJ SOLN: 0.4000 mg | Freq: Once | INTRAMUSCULAR | Status: AC

## 2023-01-10 MED ORDER — SODIUM CHLORIDE 0.9 % IV BOLUS
1000.0000 mL | Freq: Once | INTRAVENOUS | Status: AC
  Administered 2023-01-10: 1000 mL via INTRAVENOUS

## 2023-01-10 MED ORDER — NALOXONE HCL 0.4 MG/ML IJ SOLN
INTRAMUSCULAR | Status: AC
  Administered 2023-01-10: 0.4 mg via INTRAVENOUS
  Filled 2023-01-10: qty 1

## 2023-01-10 NOTE — Progress Notes (Signed)
Mobility Specialist Progress Note:    01/10/23 1123  Mobility  Activity Transferred from bed to chair  Level of Assistance Maximum assist, patient does 25-49% (+2)  Assistive Device Other (Comment) (HHA)  Activity Response Tolerated fair  Mobility Referral Yes  Mobility visit 1 Mobility  Mobility Specialist Start Time (ACUTE ONLY) [id number]  Mobility Specialist Stop Time (ACUTE ONLY) 0932  Mobility Specialist Time Calculation (min) (ACUTE ONLY) 14 min   Pt received in bed agreeable to mobility. Pt needed ModA +2 for bed mobility and MaxA+2 w/ HHA for STS. Pt had strong difficulty moving their feet to get to chair. Pt tried to prematurely sit. MS and NT corrected. Left in chair w/ call bell and personal belongings in reach. All needs met. Chair alarm on.   Thompson Grayer Mobility Specialist  Please contact vis Secure Chat or  Rehab Office [phone]

## 2023-01-10 NOTE — Progress Notes (Signed)
   01/10/23 1717  Vitals  Temp 98 F (36.7 C)  Temp Source Oral  BP (!) 93/46  MAP (mmHg) (!) 60  BP Location Right Arm  BP Method Automatic  Patient Position (if appropriate) Lying  Pulse Rate (!) 57  Pulse Rate Source Monitor  ECG Heart Rate (!) 57  Resp 17  Level of Consciousness  Level of Consciousness Alert  MEWS COLOR  MEWS Score Color Green  Oxygen Therapy  SpO2 96 %  O2 Device Nasal Cannula  O2 Flow Rate (L/min) 2 L/min  Pain Assessment  Pain Scale 0-10  Pain Score 4  MEWS Score  MEWS Temp 0  MEWS Systolic 1  MEWS Pulse 0  MEWS RR 0  MEWS LOC 0  MEWS Score 1   Girguis, MD notified of BP, 1L bolus ordered

## 2023-01-10 NOTE — Significant Event (Signed)
Rapid Response Event Note   Reason for Call :  "Hypotensive in chair, A&Ox4 though drowsy, MD paged"  Moved to bed, repeat vitals and call back if needed until able to arrive  Initial Focused Assessment:  Patient in bed, opens eyes to voice and A&Ox4 though requires repeated stimulation to finish answering questions. Skin warm/dry, edema noted. Lungs clear/diminished, heart tones normal.   87/42 (55) HR 58 RR 13 O2 98% 2L [place]  Interventions:  NS bolus ABG  0.4mg  Narcan x1, some  MD to bedside Bipap PRN  108/54 (69) Patient with improvement s/p narcan though still drowsy.   Plan of Care:  May require Bipap and medication adjustment. Trend vitals post bolus completion. Call back for further needs.   Event Summary:  MD Notified: Brenda Matthews Call Time: 1227 Arrival Time: 1258 End Time: 1345  Truddie Crumble, RN

## 2023-01-10 NOTE — Progress Notes (Signed)
MEWS Progress Note  Patient Details Name: Brenda Matthews MRN: [id number] DOB: 10/31/1935 Today's Date: 01/10/2023   MEWS Flowsheet Documentation:  Assess: MEWS Score Temp: 98 F (36.7 C) BP: 104/62 MAP (mmHg): 72 Pulse Rate: (!) 58 ECG Heart Rate: (!) 58 Resp: 17 Level of Consciousness: Alert SpO2: 97 % O2 Device: Nasal Cannula Patient Activity (if Appropriate): In bed O2 Flow Rate (L/min): 2 L/min FiO2 (%): 40 % Assess: MEWS Score MEWS Temp: 0 MEWS Systolic: 0 MEWS Pulse: 0 MEWS RR: 0 MEWS LOC: 0 MEWS Score: 0 MEWS Score Color: Green Assess: SIRS CRITERIA SIRS Temperature : 0 SIRS Respirations : 0 SIRS Pulse: 0 SIRS WBC: 0 SIRS Score Sum : 0 SIRS Temperature : 0 SIRS Pulse: 0 SIRS Respirations : 0 SIRS WBC: 0 SIRS Score Sum : 0 Assess: if the MEWS score is Yellow or Red Were vital signs accurate and taken at a resting state?: Yes Does the patient meet 2 or more of the SIRS criteria?: No Does the patient have a confirmed or suspected source of infection?: Yes MEWS guidelines implemented : Yes, yellow Treat MEWS Interventions: Considered administering scheduled or prn medications/treatments as ordered Take Vital Signs Increase Vital Sign Frequency : Yellow: Q2hr x1, continue Q4hrs until patient remains green for 12hrs Escalate MEWS: Escalate: Yellow: Discuss with charge nurse and consider notifying provider and/or RRT    MD aware, Rapid response nurse at bedside, see new orders      Earleen Newport 01/10/2023, 2:47 PM

## 2023-01-10 NOTE — Progress Notes (Signed)
Dr. Frederick Peers at bedside at this time.

## 2023-01-10 NOTE — Progress Notes (Signed)
Progress Note    Brenda Matthews   JXB:[id number]  DOB: 1935-03-12  DOA: 01/02/2023     8 PCP: Jeoffrey Massed, MD  Initial CC: SOB, right leg pain/redness  Hospital Course: Brenda Matthews is an 87 y.o. female past medical history of chronic atrial fibrillation on amiodarone and Eliquis, chronic diastolic heart failure, chronic kidney disease stage III, essential hypertension recently treated with antibiotics for her lower extremity nodule by PCP comes in for lower extremity swelling and shortness of breath that started on the night of admission.  Chest x-ray was consistent with mild pulmonary edema, BNP in the thousands and  systolic blood pressure in the 200.  Initially needed BiPAP in the ED started on IV nitroglycerin we were able to wean her off BiPAP.   Interval History:  Pain a little better controlled this morning and she appears more comfortable.  Still has decent amount of erythema and edema in her legs, worse in the right.  We discussed continuing IV antibiotics for probably 1-2 more days in the hospital before pursuing discharge.  Assessment and Plan:  Right lower extremity cellulitis -Improving erythema but still present.  Still has palpable nodules at areas of erythema -Ongoing significant tenderness in right dorsum of foot - keep IV abx; Continue IV Rocephin and Doxy  Hypertensive emergency - resolved  Essential hypertension Avapro, hydralazine Lasix and metoprolol. Her dose of metoprolol was decreased due to bradycardia we will continue current dose. PT evaluated the patient recommended skilled nursing facility.   Acute respiratory failure with hypoxemia (HCC)/acute pulmonary edema in the setting of acute diastolic dysfunction: Out of bed to chair continues inspirometry try to wean to room air Continue oral Lasix Continue strict I's and O's and daily weights. Continue metoprolol and hydralazine 2D echo shows an EF of 60% grade 1 diastolic dysfunction continue Avapro  as her creatinine has returned to baseline She will d/c on metoprolol Lasix hydralazine and Avapro   Acute kidney injury Her creatinine has stabilized and improving.  Creatinine has returned to baseline. Baseline creatinine is around 1.3-1.5.   Hypokalemia -Replete as needed   PAF (paroxysmal atrial fibrillation) (HCC) Rate controlled continue metoprolol, amiodarone and Eliquis.   Acute urinary retention Foley has been discontinued.   Constipation: MiraLAX p.o. twice daily   Old records reviewed in assessment of this patient  Antimicrobials: Doxycycline 01/06/2023 >> current Rocephin 01/07/2023 >> current  DVT prophylaxis:  Place TED hose Start: 01/06/23 1242 apixaban (ELIQUIS) tablet 5 mg   Code Status:   Code Status: Full Code  Mobility Assessment (Last 72 Hours)     Mobility Assessment     Row Name 01/10/23 0936 01/09/23 2025 01/09/23 1407 01/09/23 0902 01/09/23 0400   Does patient have an order for bedrest or is patient medically unstable No - Continue assessment No - Continue assessment -- No - Continue assessment No - Continue assessment   What is the highest level of mobility based on the progressive mobility assessment? Level 3 (Stands with assist) - Balance while standing  and cannot march in place Level 3 (Stands with assist) - Balance while standing  and cannot march in place Level 3 (Stands with assist) - Balance while standing  and cannot march in place Level 3 (Stands with assist) - Balance while standing  and cannot march in place Level 4 (Walks with assist in room) - Balance while marching in place and cannot step forward and back - Complete   Is the above level different  from baseline mobility prior to current illness? Yes - Recommend PT order -- -- Yes - Recommend PT order Yes - Recommend PT order    Row Name 01/09/23 0024 01/08/23 2040 01/08/23 0900 01/07/23 2300     Does patient have an order for bedrest or is patient medically unstable No - Continue  assessment No - Continue assessment No - Continue assessment No - Continue assessment    What is the highest level of mobility based on the progressive mobility assessment? Level 4 (Walks with assist in room) - Balance while marching in place and cannot step forward and back - Complete Level 4 (Walks with assist in room) - Balance while marching in place and cannot step forward and back - Complete Level 4 (Walks with assist in room) - Balance while marching in place and cannot step forward and back - Complete Level 4 (Walks with assist in room) - Balance while marching in place and cannot step forward and back - Complete    Is the above level different from baseline mobility prior to current illness? Yes - Recommend PT order Yes - Recommend PT order Yes - Recommend PT order Yes - Recommend PT order             Barriers to discharge: none Disposition Plan:  SNF Status is: Inpt  Objective: Blood pressure (!) 126/53, pulse 72, temperature 98.2 F (36.8 C), temperature source Oral, resp. rate 19, height 5\' 1"  (1.549 m), weight 76.9 kg, SpO2 97%.  Examination:  Physical Exam Constitutional:      Comments: Uncomfortable appearing from pain in her legs notably right foot  HENT:     Head: Normocephalic and atraumatic.     Mouth/Throat:     Mouth: Mucous membranes are moist.  Eyes:     Extraocular Movements: Extraocular movements intact.  Cardiovascular:     Rate and Rhythm: Normal rate and regular rhythm.  Pulmonary:     Effort: Pulmonary effort is normal. No respiratory distress.     Breath sounds: Normal breath sounds. No wheezing.  Abdominal:     General: Bowel sounds are normal. There is no distension.     Palpations: Abdomen is soft.     Tenderness: There is no abdominal tenderness.  Musculoskeletal:     Cervical back: Normal range of motion and neck supple.     Comments: Multiple palpable nodules in bilateral legs, mildly tender.  Surrounding erythema around nodules but shows  improvement from pen demarcation.  Worse tenderness on dorsum of right foot.  2-3+ pitting edema appreciated right greater than left leg  Skin:    General: Skin is warm and dry.     Findings: Erythema (B/L LE) present.  Neurological:     General: No focal deficit present.     Mental Status: She is alert.  Psychiatric:        Mood and Affect: Mood normal.      Consultants:    Procedures:    Data Reviewed: No results found for this or any previous visit (from the past 24 hours).  I have reviewed pertinent nursing notes, vitals, labs, and images as necessary. I have ordered labwork to follow up on as indicated.  I have reviewed the last notes from staff over past 24 hours. I have discussed patient's care plan and test results with nursing staff, CM/SW, and other staff as appropriate.  Time spent: Greater than 50% of the 55 minute visit was spent in counseling/coordination of care for the  patient as laid out in the A&P.   LOS: 8 days   Lewie Chamber, MD [facility] 01/10/2023, 11:36 AM

## 2023-01-10 NOTE — Plan of Care (Addendum)
At bedside for RR.  Lethargic but arouses easily and follows commands. BP 70s-80s/30s-40s, no tachycardia Pertinent meds: Lasix 0936 Hydralazine 0617 Avapro 0936 Lopressor 0935 Xanax 0936 Oxy 0935  Plan: - agree with NS bolus - agree with ABG; start bipap if hypercarbic pCO2>50 - continue further NS bolus until BP improves - narcan 0.4 mg x 1 now - will hold BP meds going forward; resuming as indicated  - caution with oxy, xanax going forward; need to space out timing   Update at 3PM: - awake, alert, now eating lunch - BP 104/62 after bolus - ABG reviewed; no need for bipap given improved mentation, level of alertness, and no distress  Lewie Chamber, MD [facility] 01/10/2023, 1:41 PM

## 2023-01-10 NOTE — Progress Notes (Signed)
BIPAP necessity discussed with respiratory team and MD. Per Dr. Frederick Peers, no indication for BIPAP currently. Holding off for now. Will continue to monitor.

## 2023-01-10 NOTE — Progress Notes (Signed)
11:04AM: CSW called [employer] to cancel pts auth. Per [employer], auth expires tonight and ins is going to let it expire. Will need to resubmit auth when pt is medically stable to DC back to [facility].  Johnnette Gourd, MSW, LCSWA Transitions of Care [phone]

## 2023-01-10 NOTE — Plan of Care (Signed)
  Problem: Education: Goal: Knowledge of General Education information will improve Description: Including pain rating scale, medication(s)/side effects and non-pharmacologic comfort measures Outcome: Progressing   Problem: Coping: Goal: Level of anxiety will decrease Outcome: Progressing   

## 2023-01-10 NOTE — Progress Notes (Signed)
   01/10/23 1226  Vitals  BP (!) 77/38  MAP (mmHg) (!) 51  Pulse Rate 60  ECG Heart Rate 60  Resp 20  MEWS COLOR  MEWS Score Color Yellow  Oxygen Therapy  SpO2 96 %  MEWS Score  MEWS Temp 0  MEWS Systolic 2  MEWS Pulse 0  MEWS RR 0  MEWS LOC 0  MEWS Score 2   CN notified that patient SBP is trending mid 80s sitting in chair, no acute signs of distress. Patient able to tell CN, name, date of birth, and location. NT and MS assisted patient back to bed. CN recheck BP during lying position and BP is 89/52.  NT set BP checks to q15 mins per CN request. CN spoke to Rapid RN and discussed meds received during shift and CN paged MD to notify. CN held 1200 hydralazine.

## 2023-01-11 DIAGNOSIS — N179 Acute kidney failure, unspecified: Secondary | ICD-10-CM | POA: Diagnosis not present

## 2023-01-11 DIAGNOSIS — F32A Depression, unspecified: Secondary | ICD-10-CM

## 2023-01-11 DIAGNOSIS — I5033 Acute on chronic diastolic (congestive) heart failure: Secondary | ICD-10-CM | POA: Diagnosis not present

## 2023-01-11 DIAGNOSIS — I10 Essential (primary) hypertension: Secondary | ICD-10-CM | POA: Diagnosis not present

## 2023-01-11 DIAGNOSIS — L03115 Cellulitis of right lower limb: Secondary | ICD-10-CM | POA: Diagnosis not present

## 2023-01-11 DIAGNOSIS — R338 Other retention of urine: Secondary | ICD-10-CM

## 2023-01-11 MED ORDER — ACETAMINOPHEN 500 MG PO TABS
1000.0000 mg | ORAL_TABLET | Freq: Three times a day (TID) | ORAL | Status: DC
  Administered 2023-01-11 – 2023-01-14 (×9): 1000 mg via ORAL
  Filled 2023-01-11 (×9): qty 2

## 2023-01-11 MED ORDER — GABAPENTIN 100 MG PO CAPS
100.0000 mg | ORAL_CAPSULE | Freq: Three times a day (TID) | ORAL | Status: DC
  Administered 2023-01-11 – 2023-01-14 (×8): 100 mg via ORAL
  Filled 2023-01-11 (×9): qty 1

## 2023-01-11 MED ORDER — GABAPENTIN 100 MG PO CAPS: 200.0000 mg | ORAL_CAPSULE | Freq: Three times a day (TID) | ORAL | Status: DC

## 2023-01-11 NOTE — Assessment & Plan Note (Signed)
Patient with improvement in rash, but continue to have local pain and mild edema.  Patient had 5 days of IV ceftriaxone and 6 days of doxycycline.   Plan to hold on antibiotic therapy for now.  Continue pain control with acetaminophen, oxycodone and will add gabapentin.  For break through pain has IV morphine.

## 2023-01-11 NOTE — Plan of Care (Signed)
  Problem: Education: Goal: Knowledge of General Education information will improve Description: Including pain rating scale, medication(s)/side effects and non-pharmacologic comfort measures Outcome: Progressing   Problem: Health Behavior/Discharge Planning: Goal: Ability to manage health-related needs will improve Outcome: Progressing   Problem: Clinical Measurements: Goal: Diagnostic test results will improve Outcome: Progressing   Problem: Clinical Measurements: Goal: Cardiovascular complication will be avoided Outcome: Progressing   Problem: Activity: Goal: Risk for activity intolerance will decrease Outcome: Progressing   Problem: Coping: Goal: Level of anxiety will decrease Outcome: Progressing   Problem: Elimination: Goal: Will not experience complications related to bowel motility Outcome: Progressing   Problem: Safety: Goal: Ability to remain free from injury will improve Outcome: Progressing

## 2023-01-11 NOTE — Assessment & Plan Note (Addendum)
CKD stage 3b, hypokalemia  Check renal function and electrolytes in am.   Foley cathter has been removed, (she had transitory urinary retention).

## 2023-01-11 NOTE — Progress Notes (Addendum)
  Progress Note   Patient: Brenda Matthews:[id number] DOB: 11/24/1935 DOA: 01/02/2023     9 DOS: the patient was seen and examined on 01/11/2023   Brief hospital course: Brenda Matthews was admitted to the hospital with the working diagnosis of right lower extremity cellulitis in the setting of heart failure decompensation.   87 y.o. female past medical history of chronic atrial fibrillation on amiodarone and Eliquis, chronic diastolic heart failure, chronic kidney disease stage III, essential hypertension recently treated with antibiotics for her lower extremity nodule by PCP comes in for lower extremity swelling and shortness of breath that started on the night of admission.  Chest x-ray was consistent with mild pulmonary edema, BNP in the thousands and  systolic blood pressure in the 200.  Initially needed BiPAP in the ED started on IV nitroglycerin we were able to wean her off BiPAP.   12/25 continue to have right leg pain. She required naloxone yesterday due to oversedation.    Assessment and Plan: * Cellulitis of right leg Patient with improvement in rash, but continue to have local pain and mild edema.  Patient had 5 days of IV ceftriaxone and 6 days of doxycycline.   Plan to hold on antibiotic therapy for now.  Continue pain control with acetaminophen, oxycodone and will add gabapentin.  For break through pain has IV morphine.   Essential hypertension Continue blood pressure control with metoprolol.   Acute on chronic diastolic CHF (congestive heart failure) (HCC) Echocardiogram with preserved LV systolic function.   Has responded well to diuresis with furosemide.  Continue blood pressure control with metoprolol and hydralazine  SGLT 2 inh.  Currently not on diuretic therapy   Acute hypoxemic respiratory failure due to acute cardiogenic pulmonary edema.  Oxygrnation has improved.   AKI (acute kidney injury) (HCC) CKD stage 3b, hypokalemia  Check renal function and  electrolytes in am.   Foley cathter has been removed, (she had transitory urinary retention).  PAF (paroxysmal atrial fibrillation) (HCC) Continue rate control with metoprolol and anticoagulation with apixaban.  Continue with amiodarone.   Depression Continue with quetiapine and alprazolam.    Subjective: Patient with persistent right leg pain, no nausea or vomiting, no dyspnea or chest pain   Physical Exam: Vitals:   01/11/23 0333 01/11/23 0719 01/11/23 0911 01/11/23 1347  BP: (!) 115/49 (!) 111/48 (!) 109/47 (!) 103/37  Pulse: 62 (!) 57 63 64  Resp: 19 14 18 13   Temp: 97.6 F (36.4 C) (!) 97.4 F (36.3 C)  (!) 97.2 F (36.2 C)  TempSrc: Oral Oral  Oral  SpO2: 96% 98% 99% 96%  Weight: 78 kg     Height:       Neurology awake and alert, deconditioned and ill looking appearing ENT with mild pallor Cardiovascular with S1 and S2 present and regular with no gallops, rubs or murmurs Respiratory with no rales or wheezing, no rhonchi Abdomen with no distention  Positive non pitting bilateral lower extremity edema Rash has been improving  Data Reviewed:    Family Communication: no family at the bedside   Disposition: Status is: Inpatient Remains inpatient appropriate because: pain control   Planned Discharge Destination: SNF      Author: Coralie Keens, MD 01/11/2023 4:09 PM  For on call review [email or web address].

## 2023-01-11 NOTE — Assessment & Plan Note (Addendum)
Echocardiogram with preserved LV systolic function.   Has responded well to diuresis with furosemide.  Continue blood pressure control with metoprolol and hydralazine  SGLT 2 inh.  Currently not on diuretic therapy   Acute hypoxemic respiratory failure due to acute cardiogenic pulmonary edema.  Oxygrnation has improved.

## 2023-01-11 NOTE — Assessment & Plan Note (Signed)
Continue blood pressure control with metoprolol.

## 2023-01-11 NOTE — Assessment & Plan Note (Signed)
Continue with quetiapine and alprazolam.

## 2023-01-11 NOTE — Assessment & Plan Note (Addendum)
Continue rate control with metoprolol and anticoagulation with apixaban.  Continue with amiodarone.

## 2023-01-12 ENCOUNTER — Inpatient Hospital Stay (HOSPITAL_COMMUNITY): Payer: Medicare PPO

## 2023-01-12 DIAGNOSIS — L03115 Cellulitis of right lower limb: Secondary | ICD-10-CM | POA: Diagnosis not present

## 2023-01-12 LAB — URINALYSIS, ROUTINE W REFLEX MICROSCOPIC
Bilirubin Urine: NEGATIVE
Glucose, UA: 50 mg/dL — AB
Hgb urine dipstick: NEGATIVE
Ketones, ur: NEGATIVE mg/dL
Nitrite: NEGATIVE
Protein, ur: NEGATIVE mg/dL
Specific Gravity, Urine: 1.015 (ref 1.005–1.030)
pH: 5 (ref 5.0–8.0)

## 2023-01-12 LAB — BASIC METABOLIC PANEL
Anion gap: 10 (ref 5–15)
BUN: 52 mg/dL — ABNORMAL HIGH (ref 8–23)
CO2: 24 mmol/L (ref 22–32)
Calcium: 8.7 mg/dL — ABNORMAL LOW (ref 8.9–10.3)
Chloride: 104 mmol/L (ref 98–111)
Creatinine, Ser: 2.15 mg/dL — ABNORMAL HIGH (ref 0.44–1.00)
GFR, Estimated: 22 mL/min — ABNORMAL LOW (ref >60)
Glucose, Bld: 87 mg/dL (ref 70–99)
Potassium: 3.7 mmol/L (ref 3.5–5.1)
Sodium: 138 mmol/L (ref 135–145)

## 2023-01-12 LAB — CBC
HCT: 29.3 % — ABNORMAL LOW (ref 36.0–46.0)
Hemoglobin: 8.7 g/dL — ABNORMAL LOW (ref 12.0–15.0)
MCH: 28.3 pg (ref 26.0–34.0)
MCHC: 29.7 g/dL — ABNORMAL LOW (ref 30.0–36.0)
MCV: 95.4 fL (ref 80.0–100.0)
Platelets: 235 10*3/uL (ref 150–400)
RBC: 3.07 MIL/uL — ABNORMAL LOW (ref 3.87–5.11)
RDW: 15 % (ref 11.5–15.5)
WBC: 8.2 10*3/uL (ref 4.0–10.5)
nRBC: 0 % (ref 0.0–0.2)

## 2023-01-12 LAB — MAGNESIUM: Magnesium: 2.3 mg/dL (ref 1.7–2.4)

## 2023-01-12 MED ORDER — ALPRAZOLAM 0.25 MG PO TABS: 0.2500 mg | ORAL_TABLET | Freq: Three times a day (TID) | ORAL | Status: DC | PRN

## 2023-01-12 MED ORDER — APIXABAN 2.5 MG PO TABS
2.5000 mg | ORAL_TABLET | Freq: Two times a day (BID) | ORAL | Status: DC
  Administered 2023-01-12 – 2023-01-14 (×4): 2.5 mg via ORAL
  Filled 2023-01-12 (×4): qty 1

## 2023-01-12 MED ORDER — OXYCODONE HCL 5 MG PO TABS: 2.5000 mg | ORAL_TABLET | ORAL | Status: DC | PRN

## 2023-01-12 MED ORDER — POLYETHYLENE GLYCOL 3350 17 G PO PACK
17.0000 g | PACK | Freq: Every day | ORAL | Status: DC
  Administered 2023-01-12: 17 g via ORAL
  Filled 2023-01-12 (×2): qty 1

## 2023-01-12 NOTE — Progress Notes (Signed)
Patient yet to void this shift. Bladder scan volume 293. Patient has poor oral fluid intake, provided with ice water 370cc and encouraged to drink. RN will recheck bladder scan/residual  if patient fails to have adequate urine output. Elnita Maxwell, RN

## 2023-01-12 NOTE — Progress Notes (Signed)
   01/12/23 0645  Urine Characteristics  Bladder Scan Volume (mL) 557 mL  Intermittent/Straight Cath (mL) 650 mL  Intermittent Catheter Size 16  Hygiene Peri care   Pt unable to void the whole night, In and out cath done, results above.

## 2023-01-12 NOTE — Progress Notes (Signed)
Physical Therapy Treatment Patient Details Name: Brenda Matthews MRN: [id number] DOB: 11-25-1935 Today's Date: 01/12/2023   History of Present Illness Brenda Matthews is an 87 y.o. female admitted for lower extremity swelling and SOB 12/16. Chest x-ray was consistent with mild pulmonary edema PMH: chronic afib on amiodarone and Eliquis, chronic diastolic heart failure, CKD stage III, essential hypertension recently treated with antibiotics for her lower extremity nodule by PCP    PT Comments  Patient resting in recliner and agreeable to participate in therapy. Pt completed AROM for LE strengthening and to increase alertness at start of session. Cues required frequently to encourage greater muscle activation and maintain alertness. Patient expressed fears of falling and anxious but easily calmed and educated on purpose of mobility. Stedy utilized for transfer chair>bed. Max+2 assist required with max cues to activate hip extensors for more upright stand. Pt with minimal effort initially and minimal effort to rise from stedy paddles. Mod assist to return to supine at EOS and pt moved to trendelenburg. With cue spt reached bil UE to head board and assisted with superior boost for repositioning. EOS Alarm on and call bell within reach. Will continue to progress pt as able.    If plan is discharge home, recommend the following: A little help with walking and/or transfers;A little help with bathing/dressing/bathroom   Can travel by private vehicle     Yes  Equipment Recommendations  None recommended by PT    Recommendations for Other Services OT consult     Precautions / Restrictions Precautions Precautions: Fall Restrictions Weight Bearing Restrictions Per Provider Order: No     Mobility  Bed Mobility Overal bed mobility: Needs Assistance Bed Mobility: Sit to Supine       Sit to supine: Used rails, HOB elevated, Mod assist   General bed mobility comments: Cues for use of rails to control  lowering trunk. pt required mod assist to raise bil LE's onto bed and assist to lower trunk.    Transfers Overall transfer level: Needs assistance Equipment used: Ambulation equipment used Transfers: Sit to/from Stand, Bed to chair/wheelchair/BSC Sit to Stand: Via lift equipment, Max assist, +2 physical assistance, +2 safety/equipment           General transfer comment: Pt OOB in recliner at start. pt required mod assist to position UE's on Stedy crossbar. Max assist +2 required to power up, max cues to activate hip extensors for upright posture to clear Stedy paddles. Transfer via Lift Equipment: Stedy  Ambulation/Gait                   Stairs             Wheelchair Mobility     Tilt Bed    Modified Rankin (Stroke Patients Only)       Balance Overall balance assessment: Needs assistance Sitting-balance support: Feet supported, Bilateral upper extremity supported Sitting balance-Leahy Scale: Fair Sitting balance - Comments: flexed posture when leaning anteriorly   Standing balance support: Reliant on assistive device for balance, Bilateral upper extremity supported Standing balance-Leahy Scale: Poor                              Cognition Arousal: Alert Behavior During Therapy: Anxious Overall Cognitive Status: Impaired/Different from baseline Area of Impairment: Attention, Memory, Following commands, Safety/judgement, Awareness, Problem solving                   Current  Attention Level: Sustained Memory: Decreased recall of precautions, Decreased short-term memory Following Commands: Follows one step commands inconsistently, Follows one step commands with increased time Safety/Judgement: Decreased awareness of safety, Decreased awareness of deficits Awareness: Intellectual Problem Solving: Slow processing, Decreased initiation, Difficulty sequencing, Requires verbal cues          Exercises General Exercises - Lower  Extremity Ankle Circles/Pumps: Both, AROM, 10 reps Long Arc Quad: Both, AROM, 10 reps Hip Flexion/Marching: Both, AROM, 10 reps    General Comments        Pertinent Vitals/Pain Pain Assessment Pain Assessment: Faces Faces Pain Scale: Hurts little more Pain Location: genearlized anxiety, no specified pain, has reported bil feet pain in standing at times Pain Descriptors / Indicators: Discomfort Pain Intervention(s): Limited activity within patient's tolerance, Monitored during session, Repositioned    Home Living                          Prior Function            PT Goals (current goals can now be found in the care plan section) Acute Rehab PT Goals Patient Stated Goal: back to ILF PT Goal Formulation: With patient Time For Goal Achievement: 01/17/23 Potential to Achieve Goals: Good Progress towards PT goals: Progressing toward goals    Frequency    Min 1X/week      PT Plan      Co-evaluation              AM-PAC PT "6 Clicks" Mobility   Outcome Measure  Help needed turning from your back to your side while in a flat bed without using bedrails?: A Little Help needed moving from lying on your back to sitting on the side of a flat bed without using bedrails?: A Little Help needed moving to and from a bed to a chair (including a wheelchair)?: A Lot Help needed standing up from a chair using your arms (e.g., wheelchair or bedside chair)?: A Lot Help needed to walk in hospital room?: Total Help needed climbing 3-5 steps with a railing? : Total 6 Click Score: 12    End of Session Equipment Utilized During Treatment: Gait belt Activity Tolerance: Patient tolerated treatment well Patient left: in bed;with call bell/phone within reach;with bed alarm set Nurse Communication: Mobility status PT Visit Diagnosis: Unsteadiness on feet (R26.81);Muscle weakness (generalized) (M62.81)     Time: 1610-9604 PT Time Calculation (min) (ACUTE ONLY): 30  min  Charges:    $Therapeutic Activity: 23-37 mins PT General Charges $$ ACUTE PT VISIT: 1 Visit                     Wynn Maudlin, DPT Acute Rehabilitation Services Office [phone]  01/12/23 3:32 PM

## 2023-01-12 NOTE — Progress Notes (Addendum)
PROGRESS NOTE    Brenda Matthews  ION:[id number] DOB: 02-09-1935 DOA: 01/02/2023 PCP: Jeoffrey Massed, MD  Chief Complaint  Patient presents with   Shortness of Breath    Brief Narrative:   [name] was admitted to the hospital with the working diagnosis of right lower extremity cellulitis in the setting of heart failure decompensation.    87 y.o. female past medical history of chronic atrial fibrillation on amiodarone and Eliquis, chronic diastolic heart failure, chronic kidney disease stage III, essential hypertension recently treated with antibiotics for her lower extremity nodule by PCP comes in for lower extremity swelling and shortness of breath that started on the night of admission.  Chest x-ray was consistent with mild pulmonary edema, BNP in the thousands and  systolic blood pressure in the 200.  Initially needed BiPAP in the ED started on IV nitroglycerin we were able to wean her off BiPAP.     Assessment & Plan:   Principal Problem:   Cellulitis of right leg Active Problems:   Essential hypertension   Acute on chronic diastolic CHF (congestive heart failure) (HCC)   AKI (acute kidney injury) (HCC)   PAF (paroxysmal atrial fibrillation) (HCC)   Depression  Cellulitis of right leg Seems to be improving (no longer on antibiotics), some encephalopathy with pain meds - will reduce xanax/oxycodone dosing and monitor   Erythema Nodosum Nodules to bilateral LE's, will monitor  Acute Metabolic encephalopathy Required narcan due to oversedation  Reduce pain meds as able   Essential hypertension Continue blood pressure control with metoprolol and hydralazine   Acute on chronic diastolic CHF (congestive heart failure) (HCC) Echocardiogram with preserved LV systolic function.  Improved with diuresis Metoprolol and hydralazine Has responded well to diuresis with furosemide.  Now on hold. Continue blood pressure control with metoprolol and hydralazine  SGLT 2 inh. On  hold with AKI  Currently not on diuretic therapy    Acute hypoxemic respiratory failure due to acute cardiogenic pulmonary edema Resolved, as above   AKI (acute kidney injury) (HCC) Creatinine bumped to 2.15 today Holding SGLT2 and reduced dose of eliquis Diuresis currently on hold  Renal US, UA  Acute urinary retention Resolved, foley d/c'd -> foley placed again today   PAF (paroxysmal atrial fibrillation) (HCC) Amiodarone, eliquis (dose reduced with AKI)  Depression Stop seroquel, reduce dose of xanax with concern for encephalopathy     DVT prophylaxis: eliquis Code Status: full Family Communication: none Disposition:   Status is: Inpatient Remains inpatient appropriate because: need for continued inpatient care   Consultants:  noen  Procedures:  Echo IMPRESSIONS     1. Left ventricular ejection fraction, by estimation, is 60 to 65%. The  left ventricle has normal function. The left ventricle has no regional  wall motion abnormalities. There is mild concentric left ventricular  hypertrophy. Left ventricular diastolic  parameters are consistent with Grade I diastolic dysfunction (impaired  relaxation).   2. Right ventricular systolic function is normal. The right ventricular  size is normal. There is normal pulmonary artery systolic pressure.   3. Left atrial size was moderately dilated.   4. Right atrial size was moderately dilated.   5. The mitral valve is normal in structure. Trivial mitral valve  regurgitation. No evidence of mitral stenosis.   6. The aortic valve is tricuspid. There is mild calcification of the  aortic valve. Aortic valve regurgitation is mild. No aortic stenosis is  present.   7. Aortic dilatation noted. There is mild dilatation  of the ascending  aorta, measuring 40 mm.   8. The inferior vena cava is normal in size with <50% respiratory  variability, suggesting right atrial pressure of 8 mmHg.   Antimicrobials:  Anti-infectives (From  admission, onward)    Start     Dose/Rate Route Frequency Ordered Stop   01/07/23 1015  cefTRIAXone (ROCEPHIN) 2 g in sodium chloride 0.9 % 100 mL IVPB  Status:  Discontinued        2 g 200 mL/hr over 30 Minutes Intravenous Every 24 hours 01/07/23 0925 01/11/23 1612   01/06/23 1030  doxycycline (VIBRA-TABS) tablet 100 mg  Status:  Discontinued        100 mg Oral Every 12 hours 01/06/23 0941 01/11/23 1612       Subjective: No new complaints C/o pain to plantar aspect of feet  Objective: Vitals:   01/12/23 0604 01/12/23 0725 01/12/23 1218 01/12/23 1623  BP: (!) 118/53 (!) 113/42 104/73 (!) 111/46  Pulse: 71 73  62  Resp: 17 18 20 17   Temp: 98.4 F (36.9 C) (!) 97.5 F (36.4 C) 97.8 F (36.6 C) (!) 97 F (36.1 C)  TempSrc: Oral Oral Oral Oral  SpO2: 98% 96%  99%  Weight: 76.3 kg     Height:        Intake/Output Summary (Last 24 hours) at 01/12/2023 1641 Last data filed at 01/12/2023 1257 Gross per 24 hour  Intake 240 ml  Output 1750 ml  Net -1510 ml   Filed Weights   01/10/23 0542 01/11/23 0333 01/12/23 0604  Weight: 76.9 kg 78 kg 76.3 kg    Examination:  General exam: Appears calm and comfortable  Respiratory system: unlabored Cardiovascular system:RRR Central nervous system: Alert and oriented. No focal neurological deficits. Extremities: erythema to RLE is mild, has nodules to bilateral LE's not particularly tender on exam    Data Reviewed: I have personally reviewed following labs and imaging studies  CBC: Recent Labs  Lab 01/12/23 0230  WBC 8.2  HGB 8.7*  HCT 29.3*  MCV 95.4  PLT 235    Basic Metabolic Panel: Recent Labs  Lab 01/06/23 0317 01/07/23 0238 01/08/23 0233 01/12/23 0230  NA 142 134* 137 138  K 5.1 4.0 3.8 3.7  CL 104 104 101 104  CO2 25 26 28 24   GLUCOSE 131* 103* 98 87  BUN 30* 29* 28* 52*  CREATININE 1.63* 1.39* 1.42* 2.15*  CALCIUM 9.0 8.1* 8.7* 8.7*  MG  --   --   --  2.3    GFR: Estimated Creatinine Clearance:  17.9 mL/min (Akansha Wyche) (by C-G formula based on SCr of 2.15 mg/dL (H)).  Liver Function Tests: No results for input(s): "AST", "ALT", "ALKPHOS", "BILITOT", "PROT", "ALBUMIN" in the last 168 hours.  CBG: No results for input(s): "GLUCAP" in the last 168 hours.   Recent Results (from the past 240 hours)  Respiratory (~20 pathogens) panel by PCR     Status: None   Collection Time: 01/02/23  5:36 PM   Specimen: Nasopharyngeal Swab; Respiratory  Result Value Ref Range Status   Adenovirus NOT DETECTED NOT DETECTED Final   Coronavirus 229E NOT DETECTED NOT DETECTED Final    Comment: (NOTE) The Coronavirus on the Respiratory Panel, DOES NOT test for the novel  Coronavirus (2019 nCoV)    Coronavirus HKU1 NOT DETECTED NOT DETECTED Final   Coronavirus NL63 NOT DETECTED NOT DETECTED Final   Coronavirus OC43 NOT DETECTED NOT DETECTED Final   Metapneumovirus NOT DETECTED NOT  DETECTED Final   Rhinovirus / Enterovirus NOT DETECTED NOT DETECTED Final   Influenza [place] NOT DETECTED NOT DETECTED Final   Influenza B NOT DETECTED NOT DETECTED Final   Parainfluenza Virus 1 NOT DETECTED NOT DETECTED Final   Parainfluenza Virus 2 NOT DETECTED NOT DETECTED Final   Parainfluenza Virus 3 NOT DETECTED NOT DETECTED Final   Parainfluenza Virus 4 NOT DETECTED NOT DETECTED Final   Respiratory Syncytial Virus NOT DETECTED NOT DETECTED Final   Bordetella pertussis NOT DETECTED NOT DETECTED Final   Bordetella Parapertussis NOT DETECTED NOT DETECTED Final   Chlamydophila pneumoniae NOT DETECTED NOT DETECTED Final   Mycoplasma pneumoniae NOT DETECTED NOT DETECTED Final    Comment: Performed at [facility], [address], [place], [place] [address]         Radiology Studies: No results found.      Scheduled Meds:  acetaminophen  1,000 mg Oral TID   amiodarone  200 mg Oral Daily   apixaban  2.5 mg Oral BID   Chlorhexidine Gluconate Cloth  6 each Topical Daily   gabapentin  100 mg Oral TID   hydrALAZINE   25 mg Oral Q6H   hydrocortisone cream   Topical TID   metoprolol tartrate  25 mg Oral BID   polyethylene glycol  17 g Oral Daily   sodium chloride flush  3 mL Intravenous Q12H   Continuous Infusions:   LOS: 10 days    Time spent: over 30 min    Lacretia Nicks, MD [facility]   To contact the attending provider between 7A-7P or the covering provider during after hours 7P-7A, please log into the web site [email or web address] and access using universal  password for that web site. If you do not have the password, please call the hospital operator.  01/12/2023, 4:41 PM

## 2023-01-12 NOTE — Plan of Care (Signed)
  Problem: Education: Goal: Knowledge of General Education information will improve Description: Including pain rating scale, medication(s)/side effects and non-pharmacologic comfort measures Outcome: Progressing   Problem: Clinical Measurements: Goal: Ability to maintain clinical measurements within normal limits will improve Outcome: Progressing   Problem: Clinical Measurements: Goal: Diagnostic test results will improve Outcome: Progressing   Problem: Clinical Measurements: Goal: Respiratory complications will improve Outcome: Progressing   Problem: Clinical Measurements: Goal: Cardiovascular complication will be avoided Outcome: Progressing   

## 2023-01-12 NOTE — TOC Progression Note (Addendum)
Transition of Care [facility]) - Progression Note    Patient Details  Name: Brenda Matthews MRN: [id number] Date of Birth: 1935-12-11  [facility] CM/SW Contact  Michaela Corner, [place] Phone Number: 01/12/2023, 10:27 AM  Clinical Narrative:   CSW will need to resubmit for auth closer to pt being medically ready to dc.   5:05PM: CSW resubmitted for ins auth. Auth pending at this time. Auth id: [id number] TOC will continue to follow.     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  Expected Discharge Plan and Services In-house Referral: Clinical Social Work     Living arrangements for the past 2 months: Marketing executive ([place])                                       Social Determinants of Health (SDOH) Interventions SDOH Screenings   Food Insecurity: No Food Insecurity (01/02/2023)  Housing: Low Risk  (01/02/2023)  Transportation Needs: No Transportation Needs (01/02/2023)  Utilities: Not At Risk (01/02/2023)  Alcohol Screen: Low Risk  (10/11/2020)  Depression (PHQ2-9): Low Risk  (09/01/2022)  Financial Resource Strain: Low Risk  (10/27/2021)  Physical Activity: Sufficiently Active (10/27/2021)  Social Connections: Moderately Integrated (10/27/2021)  Stress: No Stress Concern Present (10/27/2021)  Tobacco Use: Low Risk  (01/02/2023)    Readmission Risk Interventions    10/24/2022    9:08 AM  Readmission Risk Prevention Plan  Post Dischage Appt Complete  Medication Screening Complete  Transportation Screening Complete

## 2023-01-13 DIAGNOSIS — L03115 Cellulitis of right lower limb: Secondary | ICD-10-CM | POA: Diagnosis not present

## 2023-01-13 LAB — CBC WITH DIFFERENTIAL/PLATELET
Abs Immature Granulocytes: 0.1 10*3/uL — ABNORMAL HIGH (ref 0.00–0.07)
Basophils Absolute: 0 10*3/uL (ref 0.0–0.1)
Basophils Relative: 1 %
Eosinophils Absolute: 0.2 10*3/uL (ref 0.0–0.5)
Eosinophils Relative: 3 %
HCT: 28.5 % — ABNORMAL LOW (ref 36.0–46.0)
Hemoglobin: 8.9 g/dL — ABNORMAL LOW (ref 12.0–15.0)
Immature Granulocytes: 2 %
Lymphocytes Relative: 15 %
Lymphs Abs: 1 10*3/uL (ref 0.7–4.0)
MCH: 29.3 pg (ref 26.0–34.0)
MCHC: 31.2 g/dL (ref 30.0–36.0)
MCV: 93.8 fL (ref 80.0–100.0)
Monocytes Absolute: 0.7 10*3/uL (ref 0.1–1.0)
Monocytes Relative: 10 %
Neutro Abs: 4.7 10*3/uL (ref 1.7–7.7)
Neutrophils Relative %: 69 %
Platelets: 243 10*3/uL (ref 150–400)
RBC: 3.04 MIL/uL — ABNORMAL LOW (ref 3.87–5.11)
RDW: 15.1 % (ref 11.5–15.5)
WBC: 6.7 10*3/uL (ref 4.0–10.5)
nRBC: 0 % (ref 0.0–0.2)

## 2023-01-13 LAB — COMPREHENSIVE METABOLIC PANEL
ALT: 22 U/L (ref 0–44)
AST: 33 U/L (ref 15–41)
Albumin: 1.9 g/dL — ABNORMAL LOW (ref 3.5–5.0)
Alkaline Phosphatase: 88 U/L (ref 38–126)
Anion gap: 7 (ref 5–15)
BUN: 49 mg/dL — ABNORMAL HIGH (ref 8–23)
CO2: 26 mmol/L (ref 22–32)
Calcium: 8.7 mg/dL — ABNORMAL LOW (ref 8.9–10.3)
Chloride: 106 mmol/L (ref 98–111)
Creatinine, Ser: 1.9 mg/dL — ABNORMAL HIGH (ref 0.44–1.00)
GFR, Estimated: 26 mL/min — ABNORMAL LOW (ref >60)
Glucose, Bld: 87 mg/dL (ref 70–99)
Potassium: 3.8 mmol/L (ref 3.5–5.1)
Sodium: 139 mmol/L (ref 135–145)
Total Bilirubin: 0.4 mg/dL (ref <1.2)
Total Protein: 5.2 g/dL — ABNORMAL LOW (ref 6.5–8.1)

## 2023-01-13 LAB — MAGNESIUM: Magnesium: 2.5 mg/dL — ABNORMAL HIGH (ref 1.7–2.4)

## 2023-01-13 LAB — PHOSPHORUS: Phosphorus: 3.9 mg/dL (ref 2.5–4.6)

## 2023-01-13 MED ORDER — POLYETHYLENE GLYCOL 3350 17 G PO PACK
17.0000 g | PACK | Freq: Three times a day (TID) | ORAL | Status: DC
  Administered 2023-01-13: 17 g via ORAL
  Filled 2023-01-13: qty 1

## 2023-01-13 MED ORDER — BISACODYL 10 MG RE SUPP
10.0000 mg | Freq: Every day | RECTAL | Status: DC | PRN
  Administered 2023-01-13: 10 mg via RECTAL
  Filled 2023-01-13: qty 1

## 2023-01-13 MED ORDER — SENNOSIDES-DOCUSATE SODIUM 8.6-50 MG PO TABS: 2.0000 | ORAL_TABLET | Freq: Every evening | ORAL | Status: DC | PRN

## 2023-01-13 MED ORDER — HYDRALAZINE HCL 25 MG PO TABS
25.0000 mg | ORAL_TABLET | Freq: Three times a day (TID) | ORAL | Status: DC
  Administered 2023-01-14 (×2): 25 mg via ORAL
  Filled 2023-01-13 (×4): qty 1

## 2023-01-13 MED ORDER — POLYETHYLENE GLYCOL 3350 17 G PO PACK
17.0000 g | PACK | Freq: Two times a day (BID) | ORAL | Status: DC
  Administered 2023-01-13: 17 g via ORAL

## 2023-01-13 NOTE — Progress Notes (Signed)
Indwelling catheter removed at 1300.

## 2023-01-13 NOTE — Plan of Care (Signed)
  Problem: Education: Goal: Knowledge of General Education information will improve Description: Including pain rating scale, medication(s)/side effects and non-pharmacologic comfort measures Outcome: Progressing   Problem: Coping: Goal: Level of anxiety will decrease Outcome: Progressing   Problem: Safety: Goal: Ability to remain free from injury will improve Outcome: Progressing   

## 2023-01-13 NOTE — TOC Progression Note (Addendum)
Transition of Care [facility]) - Progression Note    Patient Details  Name: Brenda Matthews MRN: [id number] Date of Birth: [birth year]  Transition of Care [facility]) CM/SW Contact  Michaela Corner, [place] Phone Number: 01/13/2023, 12:05 PM  Clinical Narrative:   Insurance auth approval dates 01/13/2023-01/17/2023; Berkley Harvey id [id number].  Drew at [facility] confirmed pt can DC when medically stable. CSW updated MD.   12:09PM: CSW updated [name] of pts DC and insurance approval.   12:16PM: Per MD, will need to look at DC to [facility] tomorrow 12/28. Drew from [facility] stated to contact her [phone] when pt is ready to DC.   CSW updated [name] [phone] ) of potential DC tomorrow.   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  Expected Discharge Plan and Services In-house Referral: Clinical Social Work     Living arrangements for the past 2 months: Marketing executive ([facility] [facility])                                       Social Determinants of Health (SDOH) Interventions SDOH Screenings   Food Insecurity: No Food Insecurity (01/02/2023)  Housing: Low Risk  (01/02/2023)  Transportation Needs: No Transportation Needs (01/02/2023)  Utilities: Not At Risk (01/02/2023)  Alcohol Screen: Low Risk  (10/11/2020)  Depression (PHQ2-9): Low Risk  (09/01/2022)  Financial Resource Strain: Low Risk  (10/27/2021)  Physical Activity: Sufficiently Active (10/27/2021)  Social Connections: Moderately Integrated (10/27/2021)  Stress: No Stress Concern Present (10/27/2021)  Tobacco Use: Low Risk  (01/02/2023)    Readmission Risk Interventions    10/24/2022    9:08 AM  Readmission Risk Prevention Plan  Post Dischage Appt Complete  Medication Screening Complete  Transportation Screening Complete

## 2023-01-13 NOTE — Progress Notes (Addendum)
PROGRESS NOTE    MC GENTIL  WUJ:[id number] DOB: 24-May-1935 DOA: 01/02/2023 PCP: Jeoffrey Massed, MD  Chief Complaint  Patient presents with   Shortness of Breath    Brief Narrative:   Brenda Matthews was admitted to the hospital with the working diagnosis of right lower extremity cellulitis in the setting of heart failure decompensation.    87 y.o. female past medical history of chronic atrial fibrillation on amiodarone and Eliquis, chronic diastolic heart failure, chronic kidney disease stage III, essential hypertension recently treated with antibiotics for her lower extremity nodule by PCP comes in for lower extremity swelling and shortness of breath that started on the night of admission.  Chest x-ray was consistent with mild pulmonary edema, BNP in the thousands and  systolic blood pressure in the 200.  Initially needed BiPAP in the ED started on IV nitroglycerin we were able to wean her off BiPAP.     Assessment & Plan:   Principal Problem:   Cellulitis of right leg Active Problems:   Essential hypertension   Acute on chronic diastolic CHF (congestive heart failure) (HCC)   AKI (acute kidney injury) (HCC)   PAF (paroxysmal atrial fibrillation) (HCC)   Depression  Cellulitis of right leg Seems to be improving (no longer on antibiotics), some encephalopathy with pain meds - will reduce xanax/oxycodone dosing and monitor   Erythema Nodosum Nodules to bilateral LE's, will monitor  Acute Metabolic encephalopathy Required narcan due to oversedation  Reduce pain meds as able   Essential hypertension Continue blood pressure control with metoprolol and hydralazine   Acute on chronic diastolic CHF (congestive heart failure) (HCC) Echocardiogram with preserved LV systolic function.  Improved with diuresis Metoprolol and hydralazine Has responded well to diuresis with furosemide.  Now on hold. Continue blood pressure control with metoprolol and hydralazine  SGLT 2 inh. On  hold with AKI  Currently not on diuretic therapy    Acute hypoxemic respiratory failure due to acute cardiogenic pulmonary edema Resolved, as above   AKI (acute kidney injury) (HCC) Creatinine bumped to 2.15 12/26.  Improving to 1.9 today.  Holding SGLT2 and reduced dose of eliquis Diuresis currently on hold  Renal US without urinary tract dilation, UA without protein and 0-5 RBC's  Acute urinary retention Resolved, foley d/c'd -> foley placed again 12/26   Constipation  Bowel regimen Suppository Prn enema  PAF (paroxysmal atrial fibrillation) (HCC) Amiodarone, eliquis (dose reduced with AKI)  Depression Stop seroquel, reduce dose of xanax with concern for encephalopathy  Generalized Weakness  continue therapy    DVT prophylaxis: eliquis Code Status: full Family Communication: none Disposition:   Status is: Inpatient Remains inpatient appropriate because: need for continued inpatient care   Consultants:  noen  Procedures:  Echo IMPRESSIONS     1. Left ventricular ejection fraction, by estimation, is 60 to 65%. The  left ventricle has normal function. The left ventricle has no regional  wall motion abnormalities. There is mild concentric left ventricular  hypertrophy. Left ventricular diastolic  parameters are consistent with Grade I diastolic dysfunction (impaired  relaxation).   2. Right ventricular systolic function is normal. The right ventricular  size is normal. There is normal pulmonary artery systolic pressure.   3. Left atrial size was moderately dilated.   4. Right atrial size was moderately dilated.   5. The mitral valve is normal in structure. Trivial mitral valve  regurgitation. No evidence of mitral stenosis.   6. The aortic valve is tricuspid. There is  mild calcification of the  aortic valve. Aortic valve regurgitation is mild. No aortic stenosis is  present.   7. Aortic dilatation noted. There is mild dilatation of the ascending  aorta,  measuring 40 mm.   8. The inferior vena cava is normal in size with <50% respiratory  variability, suggesting right atrial pressure of 8 mmHg.   Antimicrobials:  Anti-infectives (From admission, onward)    Start     Dose/Rate Route Frequency Ordered Stop   01/07/23 1015  cefTRIAXone (ROCEPHIN) 2 g in sodium chloride 0.9 % 100 mL IVPB  Status:  Discontinued        2 g 200 mL/hr over 30 Minutes Intravenous Every 24 hours 01/07/23 0925 01/11/23 1612   01/06/23 1030  doxycycline (VIBRA-TABS) tablet 100 mg  Status:  Discontinued        100 mg Oral Every 12 hours 01/06/23 0941 01/11/23 1612       Subjective: C/o constipation   Objective: Vitals:   01/12/23 2051 01/13/23 0112 01/13/23 0602 01/13/23 0730  BP: (!) 103/45 (!) 109/44 (!) 129/57 (!) 143/53  Pulse: 66 63 74 80  Resp: 19 20 18 17   Temp: (!) 97.4 F (36.3 C) 98.5 F (36.9 C) 98 F (36.7 C) 98 F (36.7 C)  TempSrc: Oral Axillary Oral Oral  SpO2: 94% 93% 90% 95%  Weight:   76.6 kg   Height:        Intake/Output Summary (Last 24 hours) at 01/13/2023 1051 Last data filed at 01/13/2023 0905 Gross per 24 hour  Intake 60 ml  Output 900 ml  Net -840 ml   Filed Weights   01/11/23 0333 01/12/23 0604 01/13/23 0602  Weight: 78 kg 76.3 kg 76.6 kg    Examination:  General: No acute distress. Cardiovascular: RRR Lungs: unlabored Neurological: Alert and oriented 3. Moves all extremities 4 with equal strength. Cranial nerves II through XII grossly intact. Extremities: nontender nodules to bilateral LE's, erythema to RLE improving - no tenderness  Data Reviewed: I have personally reviewed following labs and imaging studies  CBC: Recent Labs  Lab 01/12/23 0230 01/13/23 0251  WBC 8.2 6.7  NEUTROABS  --  4.7  HGB 8.7* 8.9*  HCT 29.3* 28.5*  MCV 95.4 93.8  PLT 235 243    Basic Metabolic Panel: Recent Labs  Lab 01/07/23 0238 01/08/23 0233 01/12/23 0230 01/13/23 0251  NA 134* 137 138 139  K 4.0 3.8 3.7 3.8   CL 104 101 104 106  CO2 26 28 24 26   GLUCOSE 103* 98 87 87  BUN 29* 28* 52* 49*  CREATININE 1.39* 1.42* 2.15* 1.90*  CALCIUM 8.1* 8.7* 8.7* 8.7*  MG  --   --  2.3 2.5*  PHOS  --   --   --  3.9    GFR: Estimated Creatinine Clearance: 20.3 mL/min (Brenda Matthews) (by C-G formula based on SCr of 1.9 mg/dL (H)).  Liver Function Tests: Recent Labs  Lab 01/13/23 0251  AST 33  ALT 22  ALKPHOS 88  BILITOT 0.4  PROT 5.2*  ALBUMIN 1.9*    CBG: No results for input(s): "GLUCAP" in the last 168 hours.   No results found for this or any previous visit (from the past 240 hours).        Radiology Studies: US RENAL Result Date: 01/12/2023 CLINICAL DATA:  Acute kidney injury EXAM: RENAL / URINARY TRACT ULTRASOUND COMPLETE COMPARISON:  CT abdomen and pelvis dated 05/20/2021 FINDINGS: Right Kidney: Length = 9.2 cm Normal  parenchymal echogenicity with preserved corticomedullary differentiation. Lower pole simple cyst measures 2.7 x 2.2 x 1.7 cm no urinary tract dilation or shadowing calculi. The ureter is not seen. Left Kidney: Length = 9.9 cm Normal parenchymal echogenicity with preserved corticomedullary differentiation. No urinary tract dilation or shadowing calculi. The ureter is not seen. Bladder: Bladder decompressed with urinary catheter in-situ. Other: None. IMPRESSION: 1. No urinary tract dilation or shadowing calculi. Normal renal echogenicity. 2. Bladder decompressed with urinary catheter in-situ. Electronically Signed   By: Agustin Cree M.D.   On: 01/12/2023 20:17        Scheduled Meds:  acetaminophen  1,000 mg Oral TID   amiodarone  200 mg Oral Daily   apixaban  2.5 mg Oral BID   Chlorhexidine Gluconate Cloth  6 each Topical Daily   gabapentin  100 mg Oral TID   hydrALAZINE  25 mg Oral Q6H   hydrocortisone cream   Topical TID   metoprolol tartrate  25 mg Oral BID   polyethylene glycol  17 g Oral BID   sodium chloride flush  3 mL Intravenous Q12H   Continuous Infusions:   LOS:  11 days    Time spent: over 30 min    Lacretia Nicks, MD [facility]   To contact the attending provider between 7A-7P or the covering provider during after hours 7P-7A, please log into the web site [email or web address] and access using universal [place] password for that web site. If you do not have the password, please call the hospital operator.  01/13/2023, 10:51 AM

## 2023-01-13 NOTE — Plan of Care (Signed)
  Problem: Education: Goal: Knowledge of General Education information will improve Description: Including pain rating scale, medication(s)/side effects and non-pharmacologic comfort measures Outcome: Progressing   Problem: Health Behavior/Discharge Planning: Goal: Ability to manage health-related needs will improve Outcome: Progressing   Problem: Clinical Measurements: Goal: Ability to maintain clinical measurements within normal limits will improve Outcome: Progressing Goal: Diagnostic test results will improve Outcome: Progressing Goal: Respiratory complications will improve Outcome: Progressing Goal: Cardiovascular complication will be avoided Outcome: Progressing   Problem: Activity: Goal: Risk for activity intolerance will decrease Outcome: Progressing   Problem: Coping: Goal: Level of anxiety will decrease Outcome: Progressing   Problem: Elimination: Goal: Will not experience complications related to bowel motility Outcome: Progressing Goal: Will not experience complications related to urinary retention Outcome: Progressing   Problem: Safety: Goal: Ability to remain free from injury will improve Outcome: Progressing   Problem: Skin Integrity: Goal: Risk for impaired skin integrity will decrease Outcome: Progressing

## 2023-01-14 DIAGNOSIS — I5082 Biventricular heart failure: Secondary | ICD-10-CM | POA: Diagnosis present

## 2023-01-14 DIAGNOSIS — N183 Chronic kidney disease, stage 3 unspecified: Secondary | ICD-10-CM | POA: Diagnosis not present

## 2023-01-14 DIAGNOSIS — E1122 Type 2 diabetes mellitus with diabetic chronic kidney disease: Secondary | ICD-10-CM | POA: Diagnosis present

## 2023-01-14 DIAGNOSIS — Z6833 Body mass index (BMI) 33.0-33.9, adult: Secondary | ICD-10-CM | POA: Diagnosis not present

## 2023-01-14 DIAGNOSIS — I4819 Other persistent atrial fibrillation: Secondary | ICD-10-CM | POA: Diagnosis not present

## 2023-01-14 DIAGNOSIS — I150 Renovascular hypertension: Secondary | ICD-10-CM | POA: Diagnosis not present

## 2023-01-14 DIAGNOSIS — I959 Hypotension, unspecified: Secondary | ICD-10-CM | POA: Diagnosis not present

## 2023-01-14 DIAGNOSIS — E7800 Pure hypercholesterolemia, unspecified: Secondary | ICD-10-CM | POA: Diagnosis not present

## 2023-01-14 DIAGNOSIS — R918 Other nonspecific abnormal finding of lung field: Secondary | ICD-10-CM | POA: Diagnosis not present

## 2023-01-14 DIAGNOSIS — Z7401 Bed confinement status: Secondary | ICD-10-CM | POA: Diagnosis not present

## 2023-01-14 DIAGNOSIS — I5032 Chronic diastolic (congestive) heart failure: Secondary | ICD-10-CM | POA: Diagnosis not present

## 2023-01-14 DIAGNOSIS — Z79899 Other long term (current) drug therapy: Secondary | ICD-10-CM | POA: Diagnosis not present

## 2023-01-14 DIAGNOSIS — I4892 Unspecified atrial flutter: Secondary | ICD-10-CM | POA: Diagnosis present

## 2023-01-14 DIAGNOSIS — L03115 Cellulitis of right lower limb: Secondary | ICD-10-CM | POA: Diagnosis not present

## 2023-01-14 DIAGNOSIS — L52 Erythema nodosum: Secondary | ICD-10-CM | POA: Diagnosis not present

## 2023-01-14 DIAGNOSIS — G4710 Hypersomnia, unspecified: Secondary | ICD-10-CM | POA: Diagnosis not present

## 2023-01-14 DIAGNOSIS — E669 Obesity, unspecified: Secondary | ICD-10-CM | POA: Diagnosis present

## 2023-01-14 DIAGNOSIS — R531 Weakness: Secondary | ICD-10-CM | POA: Diagnosis not present

## 2023-01-14 DIAGNOSIS — E1140 Type 2 diabetes mellitus with diabetic neuropathy, unspecified: Secondary | ICD-10-CM | POA: Diagnosis present

## 2023-01-14 DIAGNOSIS — N179 Acute kidney failure, unspecified: Secondary | ICD-10-CM | POA: Diagnosis present

## 2023-01-14 DIAGNOSIS — F411 Generalized anxiety disorder: Secondary | ICD-10-CM | POA: Diagnosis not present

## 2023-01-14 DIAGNOSIS — Z961 Presence of intraocular lens: Secondary | ICD-10-CM | POA: Diagnosis present

## 2023-01-14 DIAGNOSIS — M79671 Pain in right foot: Secondary | ICD-10-CM | POA: Diagnosis not present

## 2023-01-14 DIAGNOSIS — Z66 Do not resuscitate: Secondary | ICD-10-CM | POA: Diagnosis not present

## 2023-01-14 DIAGNOSIS — F419 Anxiety disorder, unspecified: Secondary | ICD-10-CM | POA: Diagnosis not present

## 2023-01-14 DIAGNOSIS — I4891 Unspecified atrial fibrillation: Secondary | ICD-10-CM | POA: Diagnosis not present

## 2023-01-14 DIAGNOSIS — J9601 Acute respiratory failure with hypoxia: Secondary | ICD-10-CM | POA: Diagnosis present

## 2023-01-14 DIAGNOSIS — L0390 Cellulitis, unspecified: Secondary | ICD-10-CM | POA: Diagnosis not present

## 2023-01-14 DIAGNOSIS — Z8249 Family history of ischemic heart disease and other diseases of the circulatory system: Secondary | ICD-10-CM | POA: Diagnosis not present

## 2023-01-14 DIAGNOSIS — I110 Hypertensive heart disease with heart failure: Secondary | ICD-10-CM | POA: Diagnosis not present

## 2023-01-14 DIAGNOSIS — D649 Anemia, unspecified: Secondary | ICD-10-CM | POA: Diagnosis not present

## 2023-01-14 DIAGNOSIS — I480 Paroxysmal atrial fibrillation: Secondary | ICD-10-CM | POA: Diagnosis not present

## 2023-01-14 DIAGNOSIS — Z86006 Personal history of melanoma in-situ: Secondary | ICD-10-CM | POA: Diagnosis not present

## 2023-01-14 DIAGNOSIS — Z7901 Long term (current) use of anticoagulants: Secondary | ICD-10-CM | POA: Diagnosis not present

## 2023-01-14 DIAGNOSIS — I5033 Acute on chronic diastolic (congestive) heart failure: Secondary | ICD-10-CM | POA: Diagnosis present

## 2023-01-14 DIAGNOSIS — F05 Delirium due to known physiological condition: Secondary | ICD-10-CM | POA: Diagnosis not present

## 2023-01-14 DIAGNOSIS — K5790 Diverticulosis of intestine, part unspecified, without perforation or abscess without bleeding: Secondary | ICD-10-CM | POA: Diagnosis not present

## 2023-01-14 DIAGNOSIS — M79672 Pain in left foot: Secondary | ICD-10-CM | POA: Diagnosis not present

## 2023-01-14 DIAGNOSIS — N189 Chronic kidney disease, unspecified: Secondary | ICD-10-CM | POA: Diagnosis not present

## 2023-01-14 DIAGNOSIS — I517 Cardiomegaly: Secondary | ICD-10-CM | POA: Diagnosis not present

## 2023-01-14 DIAGNOSIS — N1832 Chronic kidney disease, stage 3b: Secondary | ICD-10-CM | POA: Diagnosis present

## 2023-01-14 DIAGNOSIS — I509 Heart failure, unspecified: Secondary | ICD-10-CM | POA: Diagnosis not present

## 2023-01-14 DIAGNOSIS — R0989 Other specified symptoms and signs involving the circulatory and respiratory systems: Secondary | ICD-10-CM | POA: Diagnosis not present

## 2023-01-14 DIAGNOSIS — I50811 Acute right heart failure: Secondary | ICD-10-CM | POA: Diagnosis present

## 2023-01-14 DIAGNOSIS — D631 Anemia in chronic kidney disease: Secondary | ICD-10-CM | POA: Diagnosis present

## 2023-01-14 DIAGNOSIS — I5031 Acute diastolic (congestive) heart failure: Secondary | ICD-10-CM | POA: Diagnosis not present

## 2023-01-14 DIAGNOSIS — N139 Obstructive and reflux uropathy, unspecified: Secondary | ICD-10-CM | POA: Diagnosis not present

## 2023-01-14 DIAGNOSIS — R635 Abnormal weight gain: Secondary | ICD-10-CM | POA: Diagnosis not present

## 2023-01-14 DIAGNOSIS — W19XXXA Unspecified fall, initial encounter: Secondary | ICD-10-CM | POA: Diagnosis not present

## 2023-01-14 DIAGNOSIS — R0902 Hypoxemia: Secondary | ICD-10-CM | POA: Diagnosis not present

## 2023-01-14 DIAGNOSIS — I700 Atherosclerosis of aorta: Secondary | ICD-10-CM | POA: Diagnosis not present

## 2023-01-14 DIAGNOSIS — I10 Essential (primary) hypertension: Secondary | ICD-10-CM | POA: Diagnosis not present

## 2023-01-14 DIAGNOSIS — R600 Localized edema: Secondary | ICD-10-CM | POA: Diagnosis not present

## 2023-01-14 DIAGNOSIS — R069 Unspecified abnormalities of breathing: Secondary | ICD-10-CM | POA: Diagnosis not present

## 2023-01-14 DIAGNOSIS — I130 Hypertensive heart and chronic kidney disease with heart failure and stage 1 through stage 4 chronic kidney disease, or unspecified chronic kidney disease: Secondary | ICD-10-CM | POA: Diagnosis present

## 2023-01-14 DIAGNOSIS — Z833 Family history of diabetes mellitus: Secondary | ICD-10-CM | POA: Diagnosis not present

## 2023-01-14 DIAGNOSIS — Z1152 Encounter for screening for COVID-19: Secondary | ICD-10-CM | POA: Diagnosis not present

## 2023-01-14 DIAGNOSIS — Z6832 Body mass index (BMI) 32.0-32.9, adult: Secondary | ICD-10-CM | POA: Diagnosis not present

## 2023-01-14 DIAGNOSIS — E785 Hyperlipidemia, unspecified: Secondary | ICD-10-CM | POA: Diagnosis present

## 2023-01-14 DIAGNOSIS — Z7984 Long term (current) use of oral hypoglycemic drugs: Secondary | ICD-10-CM | POA: Diagnosis not present

## 2023-01-14 DIAGNOSIS — S90122D Contusion of left lesser toe(s) without damage to nail, subsequent encounter: Secondary | ICD-10-CM | POA: Diagnosis not present

## 2023-01-14 DIAGNOSIS — G9341 Metabolic encephalopathy: Secondary | ICD-10-CM | POA: Diagnosis present

## 2023-01-14 LAB — CBC WITH DIFFERENTIAL/PLATELET
Abs Immature Granulocytes: 0.14 10*3/uL — ABNORMAL HIGH (ref 0.00–0.07)
Basophils Absolute: 0 10*3/uL (ref 0.0–0.1)
Basophils Relative: 0 %
Eosinophils Absolute: 0.2 10*3/uL (ref 0.0–0.5)
Eosinophils Relative: 2 %
HCT: 30.2 % — ABNORMAL LOW (ref 36.0–46.0)
Hemoglobin: 9.2 g/dL — ABNORMAL LOW (ref 12.0–15.0)
Immature Granulocytes: 2 %
Lymphocytes Relative: 15 %
Lymphs Abs: 1.3 10*3/uL (ref 0.7–4.0)
MCH: 28.5 pg (ref 26.0–34.0)
MCHC: 30.5 g/dL (ref 30.0–36.0)
MCV: 93.5 fL (ref 80.0–100.0)
Monocytes Absolute: 0.8 10*3/uL (ref 0.1–1.0)
Monocytes Relative: 9 %
Neutro Abs: 6.1 10*3/uL (ref 1.7–7.7)
Neutrophils Relative %: 72 %
Platelets: 263 10*3/uL (ref 150–400)
RBC: 3.23 MIL/uL — ABNORMAL LOW (ref 3.87–5.11)
RDW: 15.1 % (ref 11.5–15.5)
WBC: 8.4 10*3/uL (ref 4.0–10.5)
nRBC: 0 % (ref 0.0–0.2)

## 2023-01-14 LAB — COMPREHENSIVE METABOLIC PANEL
ALT: 26 U/L (ref 0–44)
AST: 41 U/L (ref 15–41)
Albumin: 2.1 g/dL — ABNORMAL LOW (ref 3.5–5.0)
Alkaline Phosphatase: 103 U/L (ref 38–126)
Anion gap: 9 (ref 5–15)
BUN: 38 mg/dL — ABNORMAL HIGH (ref 8–23)
CO2: 25 mmol/L (ref 22–32)
Calcium: 9 mg/dL (ref 8.9–10.3)
Chloride: 105 mmol/L (ref 98–111)
Creatinine, Ser: 1.57 mg/dL — ABNORMAL HIGH (ref 0.44–1.00)
GFR, Estimated: 32 mL/min — ABNORMAL LOW (ref >60)
Glucose, Bld: 98 mg/dL (ref 70–99)
Potassium: 4.1 mmol/L (ref 3.5–5.1)
Sodium: 139 mmol/L (ref 135–145)
Total Bilirubin: 0.5 mg/dL (ref <1.2)
Total Protein: 5.7 g/dL — ABNORMAL LOW (ref 6.5–8.1)

## 2023-01-14 LAB — MAGNESIUM: Magnesium: 2.6 mg/dL — ABNORMAL HIGH (ref 1.7–2.4)

## 2023-01-14 LAB — PHOSPHORUS: Phosphorus: 2.8 mg/dL (ref 2.5–4.6)

## 2023-01-14 MED ORDER — ALPRAZOLAM 0.5 MG PO TABS: 0.5000 mg | ORAL_TABLET | Freq: Three times a day (TID) | ORAL | 0 refills | Status: AC | PRN

## 2023-01-14 MED ORDER — ACETAMINOPHEN 500 MG PO TABS: 1000.0000 mg | ORAL_TABLET | Freq: Three times a day (TID) | ORAL | 0 refills | Status: AC | PRN

## 2023-01-14 MED ORDER — CHLORHEXIDINE GLUCONATE CLOTH 2 % EX PADS
6.0000 | MEDICATED_PAD | Freq: Every day | CUTANEOUS | Status: DC
  Administered 2023-01-14: 6 via TOPICAL

## 2023-01-14 MED ORDER — GABAPENTIN 100 MG PO CAPS: 100.0000 mg | ORAL_CAPSULE | Freq: Three times a day (TID) | ORAL | Status: DC

## 2023-01-14 MED ORDER — HYDRALAZINE HCL 25 MG PO TABS: 25.0000 mg | ORAL_TABLET | Freq: Three times a day (TID) | ORAL | Status: DC

## 2023-01-14 MED ORDER — APIXABAN 2.5 MG PO TABS: 2.5000 mg | ORAL_TABLET | Freq: Two times a day (BID) | ORAL | Status: DC

## 2023-01-14 MED ORDER — METOPROLOL TARTRATE 25 MG PO TABS: 25.0000 mg | ORAL_TABLET | Freq: Two times a day (BID) | ORAL | Status: DC

## 2023-01-14 NOTE — Discharge Summary (Addendum)
Physician Discharge Summary  Brenda Matthews ZOX:[id number] DOB: 1935/02/11 DOA: 01/02/2023  PCP: Jeoffrey Massed, MD  Admit date: 01/02/2023 Discharge date: 01/14/2023  Time spent: 40 minutes  Recommendations for Outpatient Follow-up:  Follow outpatient CBC/CMP  Follow volume status outpatient on her home lasix dosing, needs repeat BMP within 1 week to ensure creatinine stable/lytes and volume stable with dosing Eliquis dose reduced based on kidney function, follow outpatient Metoprolol dose reduced, follow outpatient, hydralazine reduced, follow outpatient Follow erythema nodosum outpatient   Repeat CXR outpatient Gabapentin was started inpatient, continue for now as she tolerates - could consider discontinuing as tolerated Follow indwelling foley outpatient - needs outpatient urology follow up for trial of void  Discharge Diagnoses:  Principal Problem:   Cellulitis of right leg Active Problems:   Essential hypertension   Acute on chronic diastolic CHF (congestive heart failure) (HCC)   AKI (acute kidney injury) (HCC)   PAF (paroxysmal atrial fibrillation) (HCC)   Depression   Discharge Condition: stable  Diet recommendation: heart healthy, diabetic  Filed Weights   01/12/23 0604 01/13/23 0602 01/14/23 0501  Weight: 76.3 kg 76.6 kg 78.2 kg    History of present illness:   Brenda Matthews was admitted to the hospital with the working diagnosis of right lower extremity cellulitis in the setting of heart failure decompensation.    87 y.o. female past medical history of chronic atrial fibrillation on amiodarone and Eliquis, chronic diastolic heart failure, chronic kidney disease stage III, essential hypertension recently treated with antibiotics for her lower extremity nodule by PCP comes in for lower extremity swelling and shortness of breath that started on the night of admission.   Chest x-ray was consistent with mild pulmonary edema, BNP in the thousands and  systolic blood  pressure in the 200.  Initially needed BiPAP in the ED started on IV nitroglycerin we were able to wean her off BiPAP.   She's improved from Brenda Matthews heart failure standpoint with diuresis.  Required extended course of antibiotics for cellulitis.    Stable for discharge today, see below for additional details.   Hospital Course:  Assessment and Plan:  Cellulitis of right leg Seems to be improving (no longer on antibiotics) Caution with pain meds/sedating meds -> issues with encephalopathy/oversedation here   Erythema Nodosum Unclear cause Nodules to bilateral LE's, continue to monitor outpatient - less tender now   Acute Metabolic encephalopathy Required narcan due to oversedation  Reduce pain meds/sedating meds as able  Now at her baseline   Essential hypertension Continue blood pressure control with metoprolol and hydralazine   Acute on chronic diastolic CHF (congestive heart failure) (HCC) Echocardiogram with preserved LV systolic function.  Improved with diuresis Metoprolol and hydralazine Will resume home lasix at discharge Continue blood pressure control with metoprolol and hydralazine  Resume SGLT2 inhibitor Currently not on diuretic therapy    Acute hypoxemic respiratory failure due to acute cardiogenic pulmonary edema Resolved, as above   AKI (acute kidney injury) (HCC) 1.5 on discharge, improved (peaked at 2.15, baseline prior to admission fluctuating, likely between 1.3-1.5) Renal US without urinary tract dilation, UA without protein and 0-5 RBC's   Acute urinary retention Retaining 592 cc on day of discharge, will discharge with foley catheter, will need outpatient urology follow up for trial of void after 7-10 days   Constipation  Bowel regimen Suppository Prn enema   PAF (paroxysmal atrial fibrillation) (HCC) Amiodarone, eliquis (dose reduced with AKI)   Depression Stop seroquel Caution with her chronic xanax  outpatient   Generalized Weakness   continue therapy    Procedures: Echo IMPRESSIONS     1. Left ventricular ejection fraction, by estimation, is 60 to 65%. The  left ventricle has normal function. The left ventricle has no regional  wall motion abnormalities. There is mild concentric left ventricular  hypertrophy. Left ventricular diastolic  parameters are consistent with Grade I diastolic dysfunction (impaired  relaxation).   2. Right ventricular systolic function is normal. The right ventricular  size is normal. There is normal pulmonary artery systolic pressure.   3. Left atrial size was moderately dilated.   4. Right atrial size was moderately dilated.   5. The mitral valve is normal in structure. Trivial mitral valve  regurgitation. No evidence of mitral stenosis.   6. The aortic valve is tricuspid. There is mild calcification of the  aortic valve. Aortic valve regurgitation is mild. No aortic stenosis is  present.   7. Aortic dilatation noted. There is mild dilatation of the ascending  aorta, measuring 40 mm.   8. The inferior vena cava is normal in size with <50% respiratory  variability, suggesting right atrial pressure of 8 mmHg.    Consultations: none  Discharge Exam: Vitals:   01/14/23 0712 01/14/23 1350  BP: (!) 154/46 132/61  Pulse: 79 67  Resp: 20 20  Temp: 98.1 F (36.7 C) 97.9 F (36.6 C)  SpO2: 91% 95%   No complaints Discussed with son   General: No acute distress. Cardiovascular: RRR Lungs: unlabored Neurological: Alert and oriented 3. Moves all extremities 4. Cranial nerves II through XII grossly intact. Skin: Warm and dry. No rashes or lesions. Extremities: nodules to bilateral LE's with central ecchymoses, erythema improved  Discharge Instructions   Discharge Instructions     Call MD for:  difficulty breathing, headache or visual disturbances   Complete by: As directed    Call MD for:  extreme fatigue   Complete by: As directed    Call MD for:  hives   Complete by:  As directed    Call MD for:  persistant dizziness or light-headedness   Complete by: As directed    Call MD for:  persistant nausea and vomiting   Complete by: As directed    Call MD for:  redness, tenderness, or signs of infection (pain, swelling, redness, odor or green/yellow discharge around incision site)   Complete by: As directed    Call MD for:  severe uncontrolled pain   Complete by: As directed    Call MD for:  temperature >100.4   Complete by: As directed    Diet - low sodium heart healthy   Complete by: As directed    Discharge instructions   Complete by: As directed    You were admitted with cellulitis (skin infection) of your legs.  You've improved with antibiotics.    In addition to the cellulitis, you had an issue called erythema nodosum.  It's unclear what the precipitating cause for this was, but I would expect these nodules should improve with time (the pain is improving).    You should follow with your outpatient doctor to review this hospitalization and your medicines.  We've reduced your dose of eliquis given your kidney function and age.  We reduced your metoprolol to 25 mg twice Brenda Matthews day.  Follow your blood pressure and heart rate with your PCP outpatient.   We've started you on gabapentin due to your pain.  This can be continued if it's well tolerated,  though as your pain improves, it'd be ok to discontinue this.   We'll resume your lasix at discharge.  Follow repeat labs with your outpatient doctor in the next week or so to adjust your regimen.   You're discharging with Brenda Matthews foley catheter.  You should follow up with urology for Brenda Matthews trial of void as an outpatient.   Return for new, recurrent or worsening symptoms.  Please ask your PCP to request records from this hospitalization so they know what was done and what the next steps will be.   Increase activity slowly   Complete by: As directed       Allergies as of 01/14/2023       Reactions   Augmentin  [amoxicillin-pot Clavulanate] Nausea And Vomiting, Other (See Comments)   "projectile vomiting" Has patient had Jenya Putz PCN reaction causing immediate rash, facial/tongue/throat swelling, SOB or lightheadedness with hypotension:No Has patient had Iaan Oregel PCN reaction causing severe rash involving mucus membranes or skin necrosis:No Has patient had Patricia Fargo PCN reaction that required hospitalization:No Has patient had Lamira Borin PCN reaction occurring within the last 10 years:Yes If all of the above answers are "NO", then may proceed with Cephalosporin use.   Amoxicillin Rash   Clindamycin/lincomycin Rash        Medication List     STOP taking these medications    acetaminophen 650 MG CR tablet Commonly known as: TYLENOL Replaced by: acetaminophen 500 MG tablet   potassium chloride SA 20 MEQ tablet Commonly known as: KLOR-CON M       TAKE these medications    acetaminophen 500 MG tablet Commonly known as: TYLENOL Take 2 tablets (1,000 mg total) by mouth every 8 (eight) hours as needed. Replaces: acetaminophen 650 MG CR tablet   albuterol 108 (90 Base) MCG/ACT inhaler Commonly known as: VENTOLIN HFA INHALE 2 PUFFS BY MOUTH into THE lungs EVERY 4 HOURS AS NEEDED FOR WHEEZING OR SHORTNESS OF BREATH What changed:  how much to take how to take this when to take this reasons to take this additional instructions   ALPRAZolam 0.5 MG tablet Commonly known as: XANAX Take 1 tablet (0.5 mg total) by mouth 3 (three) times daily as needed for up to 1 day for anxiety. TAKE 1 TABLET BY MOUTH 3 TIMES DAILY AS NEEDED FOR ANXIETY What changed:  when to take this reasons to take this additional instructions   amiodarone 200 MG tablet Commonly known as: PACERONE Take 1 tablet (200 mg total) by mouth daily.   apixaban 2.5 MG Tabs tablet Commonly known as: ELIQUIS Take 1 tablet (2.5 mg total) by mouth 2 (two) times daily. What changed:  medication strength how much to take   atorvastatin 40 MG  tablet Commonly known as: LIPITOR TAKE 1 TABLET BY MOUTH EVERY DAY   Biotin 5000 MCG Tabs Take 5,000 mcg by mouth daily.   CALCIUM 600 PO Take 1,200 mg by mouth daily.   carboxymethylcellulose 0.5 % Soln Commonly known as: REFRESH PLUS Place 2 drops into both eyes daily as needed (dry/irritated eyes.).   Centrum Silver Ultra Womens Tabs Take 1 tablet by mouth every evening.   Coenzyme Q10 200 MG capsule Take 200 mg by mouth daily.   fluticasone 50 MCG/ACT nasal spray Commonly known as: FLONASE Place 2 sprays into both nostrils daily.   furosemide 20 MG tablet Commonly known as: LASIX Take 1 tablet (20 mg total) by mouth daily.   gabapentin 100 MG capsule Commonly known as: NEURONTIN Take 1 capsule (100  mg total) by mouth 3 (three) times daily.   hydrALAZINE 25 MG tablet Commonly known as: APRESOLINE Take 1 tablet (25 mg total) by mouth every 8 (eight) hours. What changed:  medication strength how much to take when to take this   hydrocortisone 2.5 % cream APPLY DAILY AS NEEDED TO RASH ON FEET AND LEGS What changed: See the new instructions.   Jardiance 10 MG Tabs tablet Generic drug: empagliflozin TAKE 1 TABLET BY MOUTH EVERY DAY BEFORE BREAKFAST What changed: See the new instructions.   metoprolol tartrate 25 MG tablet Commonly known as: LOPRESSOR Take 1 tablet (25 mg total) by mouth 2 (two) times daily. What changed:  medication strength how much to take   PREVAGEN PO Take 1 tablet by mouth daily.        Allergies  Allergen Reactions   Augmentin [Amoxicillin-Pot Clavulanate] Nausea And Vomiting and Other (See Comments)    "projectile vomiting" Has patient had Cydnee Fuquay PCN reaction causing immediate rash, facial/tongue/throat swelling, SOB or lightheadedness with hypotension:No Has patient had Sebastiano Luecke PCN reaction causing severe rash involving mucus membranes or skin necrosis:No Has patient had Avary Eichenberger PCN reaction that required hospitalization:No Has patient had Nada Godley  PCN reaction occurring within the last 10 years:Yes If all of the above answers are "NO", then may proceed with Cephalosporin use.    Amoxicillin Rash   Clindamycin/Lincomycin Rash      The results of significant diagnostics from this hospitalization (including imaging, microbiology, ancillary and laboratory) are listed below for reference.    Significant Diagnostic Studies: US RENAL Result Date: 01/12/2023 CLINICAL DATA:  Acute kidney injury EXAM: RENAL / URINARY TRACT ULTRASOUND COMPLETE COMPARISON:  CT abdomen and pelvis dated 05/20/2021 FINDINGS: Right Kidney: Length = 9.2 cm Normal parenchymal echogenicity with preserved corticomedullary differentiation. Lower pole simple cyst measures 2.7 x 2.2 x 1.7 cm no urinary tract dilation or shadowing calculi. The ureter is not seen. Left Kidney: Length = 9.9 cm Normal parenchymal echogenicity with preserved corticomedullary differentiation. No urinary tract dilation or shadowing calculi. The ureter is not seen. Bladder: Bladder decompressed with urinary catheter in-situ. Other: None. IMPRESSION: 1. No urinary tract dilation or shadowing calculi. Normal renal echogenicity. 2. Bladder decompressed with urinary catheter in-situ. Electronically Signed   By: Agustin Cree M.D.   On: 01/12/2023 20:17   ECHOCARDIOGRAM COMPLETE Result Date: 01/03/2023    ECHOCARDIOGRAM REPORT   Patient Name:   Brenda Matthews Date of Exam: 01/03/2023 Medical Rec #:  [id number]    Height:       61.0 in Accession #:    [id number]   Weight:       167.8 lb Date of Birth:  26-Aug-1935    BSA:          1.753 m Patient Age:    87 years     BP:           135/58 mmHg Patient Gender: F            HR:           58 bpm. Exam Location:  Inpatient Procedure: 2D Echo, Cardiac Doppler, Color Doppler and Intracardiac            Opacification Agent Indications:    CHF  History:        Patient has prior history of Echocardiogram examinations, most                 recent 06/09/2022. CHF, Arrythmias:Atrial  Fibrillation,  Signs/Symptoms:Dyspnea; Risk Factors:Hypertension.  Sonographer:    Webb Laws Referring Phys: [id number] ALLISON L ELLIS IMPRESSIONS  1. Left ventricular ejection fraction, by estimation, is 60 to 65%. The left ventricle has normal function. The left ventricle has no regional wall motion abnormalities. There is mild concentric left ventricular hypertrophy. Left ventricular diastolic parameters are consistent with Grade I diastolic dysfunction (impaired relaxation).  2. Right ventricular systolic function is normal. The right ventricular size is normal. There is normal pulmonary artery systolic pressure.  3. Left atrial size was moderately dilated.  4. Right atrial size was moderately dilated.  5. The mitral valve is normal in structure. Trivial mitral valve regurgitation. No evidence of mitral stenosis.  6. The aortic valve is tricuspid. There is mild calcification of the aortic valve. Aortic valve regurgitation is mild. No aortic stenosis is present.  7. Aortic dilatation noted. There is mild dilatation of the ascending aorta, measuring 40 mm.  8. The inferior vena cava is normal in size with <50% respiratory variability, suggesting right atrial pressure of 8 mmHg. FINDINGS  Left Ventricle: Left ventricular ejection fraction, by estimation, is 60 to 65%. The left ventricle has normal function. The left ventricle has no regional wall motion abnormalities. Definity contrast agent was given IV to delineate the left ventricular  endocardial borders. The left ventricular internal cavity size was normal in size. There is mild concentric left ventricular hypertrophy. Left ventricular diastolic parameters are consistent with Grade I diastolic dysfunction (impaired relaxation). Right Ventricle: The right ventricular size is normal. No increase in right ventricular wall thickness. Right ventricular systolic function is normal. There is normal pulmonary artery systolic pressure. The tricuspid  regurgitant velocity is 2.57 m/s, and  with an assumed right atrial pressure of 8 mmHg, the estimated right ventricular systolic pressure is 34.4 mmHg. Left Atrium: Left atrial size was moderately dilated. Right Atrium: Right atrial size was moderately dilated. Pericardium: There is no evidence of pericardial effusion. Mitral Valve: The mitral valve is normal in structure. Trivial mitral valve regurgitation. No evidence of mitral valve stenosis. Tricuspid Valve: The tricuspid valve is normal in structure. Tricuspid valve regurgitation is mild . No evidence of tricuspid stenosis. Aortic Valve: The aortic valve is tricuspid. There is mild calcification of the aortic valve. Aortic valve regurgitation is mild. No aortic stenosis is present. Pulmonic Valve: The pulmonic valve was not well visualized. Pulmonic valve regurgitation is not visualized. No evidence of pulmonic stenosis. Aorta: The aortic root is normal in size and structure and aortic dilatation noted. There is mild dilatation of the ascending aorta, measuring 40 mm. Venous: The inferior vena cava is normal in size with less than 50% respiratory variability, suggesting right atrial pressure of 8 mmHg. IAS/Shunts: No atrial level shunt detected by color flow Doppler.  LEFT VENTRICLE PLAX 2D LVIDd:         4.80 cm      Diastology LVIDs:         3.30 cm      LV e' medial:    7.72 cm/s LV PW:         1.10 cm      LV E/e' medial:  7.9 LV IVS:        1.30 cm      LV e' lateral:   8.70 cm/s LVOT diam:     1.90 cm      LV E/e' lateral: 7.0 LV SV:         60 LV SV Index:   34  LVOT Area:     2.84 cm  LV Volumes (MOD) LV vol d, MOD A2C: 114.0 ml LV vol d, MOD A4C: 123.0 ml LV vol s, MOD A2C: 36.8 ml LV vol s, MOD A4C: 45.1 ml LV SV MOD A2C:     77.2 ml LV SV MOD A4C:     123.0 ml LV SV MOD BP:      76.9 ml RIGHT VENTRICLE RV Basal diam:  4.00 cm RV S prime:     11.70 cm/s TAPSE (M-mode): 2.3 cm LEFT ATRIUM             Index        RIGHT ATRIUM           Index LA diam:         4.90 cm 2.80 cm/m   RA Area:     21.10 cm LA Vol (A2C):   51.6 ml 29.44 ml/m  RA Volume:   59.90 ml  34.17 ml/m LA Vol (A4C):   46.4 ml 26.47 ml/m LA Biplane Vol: 49.1 ml 28.01 ml/m  AORTIC VALVE LVOT Vmax:   104.00 cm/s LVOT Vmean:  73.300 cm/s LVOT VTI:    0.211 m  AORTA Ao Root diam: 2.90 cm Ao Asc diam:  4.00 cm MITRAL VALVE               TRICUSPID VALVE MV Area (PHT): 3.67 cm    TR Peak grad:   26.4 mmHg MV E velocity: 61.20 cm/s  TR Vmax:        257.00 cm/s MV Kairyn Olmeda velocity: 67.30 cm/s MV E/Garrit Marrow ratio:  0.91        SHUNTS                            Systemic VTI:  0.21 m                            Systemic Diam: 1.90 cm Arvilla Meres MD Electronically signed by Arvilla Meres MD Signature Date/Time: 01/03/2023/12:43:42 PM    Final    DG Chest Port 1 View Result Date: 01/02/2023 CLINICAL DATA:  Shortness of breath. EXAM: PORTABLE CHEST 1 VIEW COMPARISON:  10/25/2022. FINDINGS: Low lung volume. Mild pulmonary vascular congestion, which may be accentuated by low lung volume. Redemonstration of left retrocardiac airspace opacity obscuring the left hemidiaphragm, descending thoracic aorta and blunting the left lateral costophrenic angle suggesting combination of left lower lobe atelectasis and/or consolidation with pleural effusion. No significant interval change. Bilateral lung fields are otherwise clear. There is subtle blunting of right lateral costophrenic angle, which may represent trace right pleural effusion versus overlying soft tissue. Stable cardio-mediastinal silhouette. No acute osseous abnormalities. The soft tissues are within normal limits. IMPRESSION: *Mild pulmonary vascular congestion, which may be accentuated by low lung volume. *Left retrocardiac opacity, as described above. Electronically Signed   By: Jules Schick M.D.   On: 01/02/2023 09:14    Microbiology: No results found for this or any previous visit (from the past 240 hours).   Labs: Basic Metabolic Panel: Recent  Labs  Lab 01/08/23 0233 01/12/23 0230 01/13/23 0251 01/14/23 0246  NA 137 138 139 139  K 3.8 3.7 3.8 4.1  CL 101 104 106 105  CO2 28 24 26 25   GLUCOSE 98 87 87 98  BUN 28* 52* 49* 38*  CREATININE 1.42* 2.15* 1.90* 1.57*  CALCIUM 8.7*  8.7* 8.7* 9.0  MG  --  2.3 2.5* 2.6*  PHOS  --   --  3.9 2.8   Liver Function Tests: Recent Labs  Lab 01/13/23 0251 01/14/23 0246  AST 33 41  ALT 22 26  ALKPHOS 88 103  BILITOT 0.4 0.5  PROT 5.2* 5.7*  ALBUMIN 1.9* 2.1*   No results for input(s): "LIPASE", "AMYLASE" in the last 168 hours. No results for input(s): "AMMONIA" in the last 168 hours. CBC: Recent Labs  Lab 01/12/23 0230 01/13/23 0251 01/14/23 0246  WBC 8.2 6.7 8.4  NEUTROABS  --  4.7 6.1  HGB 8.7* 8.9* 9.2*  HCT 29.3* 28.5* 30.2*  MCV 95.4 93.8 93.5  PLT 235 243 263   Cardiac Enzymes: No results for input(s): "CKTOTAL", "CKMB", "CKMBINDEX", "TROPONINI" in the last 168 hours. BNP: BNP (last 3 results) Recent Labs    07/05/22 1221 10/22/22 1721 01/02/23 0855  BNP 1,272.1* 176.7* 1,006.1*    ProBNP (last 3 results) No results for input(s): "PROBNP" in the last 8760 hours.  CBG: No results for input(s): "GLUCAP" in the last 168 hours.     Signed:  Lacretia Nicks MD.  [facility] 01/14/2023, 2:56 PM

## 2023-01-14 NOTE — TOC Transition Note (Signed)
Transition of Care [facility]) - Discharge Note   Patient Details  Name: Brenda Matthews MRN: [id number] Date of Birth: Jun 28, 1935  [facility]) CM/SW Contact:  Patrice Paradise, LCSW Phone Number: 01/14/2023, 11:35 AM   Clinical Narrative:     Patient will DC to: [facility]? Anticipated DC date:01/14/2023? Family notified:?[facility] QM:VHQI   Per MD patient ready for DC to [facility]. RN, patient, patient's family, and facility notified of DC. Discharge Summary sent to facility. RN given number for report   [phone] or [phone]. DC packet on chart. Ambulance transport requested for patient.  CSW signing off.   Judd Lien, [place] [phone]   Final next level of care: Skilled Nursing Facility Barriers to Discharge: Barriers Resolved   Patient Goals and CMS Choice Patient states their goals for this hospitalization and ongoing recovery are:: to go home          Discharge Placement              Patient [facility] bed at: [facility] Patient to be transferred to facility by: [facility] Name of family member notified: [name] Patient and family notified of of transfer: 01/14/23  Discharge Plan and Services Additional resources added to the After Visit Summary for   In-house Referral: Clinical Social Work                                   Social Drivers of Health (SDOH) Interventions SDOH Screenings   Food Insecurity: No Food Insecurity (01/02/2023)  Housing: Low Risk  (01/02/2023)  Transportation Needs: No Transportation Needs (01/02/2023)  Utilities: Not At Risk (01/02/2023)  Alcohol Screen: Low Risk  (10/11/2020)  Depression (PHQ2-9): Low Risk  (09/01/2022)  Financial Resource Strain: Low Risk  (10/27/2021)  Physical Activity: Sufficiently Active (10/27/2021)  Social Connections: Moderately Integrated (10/27/2021)  Stress: No Stress Concern Present (10/27/2021)  Tobacco Use: Low Risk  (01/02/2023)     Readmission  Risk Interventions    10/24/2022    9:08 AM  Readmission Risk Prevention Plan  Post Dischage Appt Complete  Medication Screening Complete  Transportation Screening Complete

## 2023-01-14 NOTE — Progress Notes (Signed)
Called report to [place] at [facility] [phone]

## 2023-01-16 DIAGNOSIS — I130 Hypertensive heart and chronic kidney disease with heart failure and stage 1 through stage 4 chronic kidney disease, or unspecified chronic kidney disease: Secondary | ICD-10-CM | POA: Diagnosis not present

## 2023-01-16 DIAGNOSIS — E785 Hyperlipidemia, unspecified: Secondary | ICD-10-CM | POA: Diagnosis not present

## 2023-01-16 DIAGNOSIS — N139 Obstructive and reflux uropathy, unspecified: Secondary | ICD-10-CM | POA: Diagnosis not present

## 2023-01-16 DIAGNOSIS — I480 Paroxysmal atrial fibrillation: Secondary | ICD-10-CM | POA: Diagnosis not present

## 2023-01-16 DIAGNOSIS — N183 Chronic kidney disease, stage 3 unspecified: Secondary | ICD-10-CM | POA: Diagnosis not present

## 2023-01-16 DIAGNOSIS — L0390 Cellulitis, unspecified: Secondary | ICD-10-CM | POA: Diagnosis not present

## 2023-01-16 DIAGNOSIS — I150 Renovascular hypertension: Secondary | ICD-10-CM | POA: Diagnosis not present

## 2023-01-16 DIAGNOSIS — R600 Localized edema: Secondary | ICD-10-CM | POA: Diagnosis not present

## 2023-01-16 DIAGNOSIS — I5032 Chronic diastolic (congestive) heart failure: Secondary | ICD-10-CM | POA: Diagnosis not present

## 2023-01-17 DIAGNOSIS — N189 Chronic kidney disease, unspecified: Secondary | ICD-10-CM | POA: Diagnosis not present

## 2023-01-17 DIAGNOSIS — I509 Heart failure, unspecified: Secondary | ICD-10-CM | POA: Diagnosis not present

## 2023-01-17 DIAGNOSIS — I130 Hypertensive heart and chronic kidney disease with heart failure and stage 1 through stage 4 chronic kidney disease, or unspecified chronic kidney disease: Secondary | ICD-10-CM | POA: Diagnosis not present

## 2023-01-20 DIAGNOSIS — N189 Chronic kidney disease, unspecified: Secondary | ICD-10-CM | POA: Diagnosis not present

## 2023-01-20 DIAGNOSIS — I509 Heart failure, unspecified: Secondary | ICD-10-CM | POA: Diagnosis not present

## 2023-01-20 DIAGNOSIS — Z66 Do not resuscitate: Secondary | ICD-10-CM | POA: Diagnosis not present

## 2023-01-20 DIAGNOSIS — I130 Hypertensive heart and chronic kidney disease with heart failure and stage 1 through stage 4 chronic kidney disease, or unspecified chronic kidney disease: Secondary | ICD-10-CM | POA: Diagnosis not present

## 2023-01-20 DIAGNOSIS — M79671 Pain in right foot: Secondary | ICD-10-CM | POA: Diagnosis not present

## 2023-01-20 DIAGNOSIS — M79672 Pain in left foot: Secondary | ICD-10-CM | POA: Diagnosis not present

## 2023-01-20 NOTE — Progress Notes (Deleted)
  Cardiology Office Note:  .   Date:  01/20/2023  ID:  Brenda Matthews, DOB 16-Oct-1935, MRN [id number] PCP: Candise Aleene DEL, MD  [facility] Providers Cardiologist: Vinie JAYSON Maxcy, MD { }   History of Present Illness: .   Brenda Matthews is a 88 y.o. female with hx of  essential hypertension, acute on chronic diastolic CHF, and paroxysmal atrial fibrillation CHA2DS2-VASc score 3. She was advised to continue Xarelto . .She underwent cardiac catheterization 8/19 which showed normal coronaries. Admitted on 10/22/2022 through 10/26/2018 for due to acute renal failure superimposed on 3B chronic kidney disease.   She was last seen on 12/09/2022 but called our office on 12/14/2022 with complaints of lower extremity edema and requested an appointment.  She also complained of having a rash.  She was advised to take her Lasix  as directed and avoid salty foods.  She was ordered 2% hydrocortisone  cream for rash but should follow-up with PCP.  ROS: ***  Studies Reviewed: .   Echocardiogram 06/09/2022    1. Left ventricular ejection fraction, by estimation, is 50 to 55%. The  left ventricle has low normal function. Left ventricular endocardial  border not optimally defined to evaluate regional wall motion. There is  mild concentric left ventricular  hypertrophy. Left ventricular diastolic parameters are indeterminate.   2. Right ventricular systolic function is normal. The right ventricular  size is normal.   3. Left atrial size was moderately dilated.   4. Right atrial size was mildly dilated.   5. The mitral valve is grossly normal. Mild to moderate mitral valve  regurgitation.   6. The aortic valve is tricuspid. There is mild calcification of the  aortic valve. There is mild thickening of the aortic valve. Aortic valve  regurgitation is mild. Aortic valve sclerosis/calcification is present,  without any evidence of aortic  stenosis.   7. Aortic dilatation noted. There is mild dilatation of the  ascending  aorta, measuring 41 mm.    *** EKG Interpretation Date/Time:    Ventricular Rate:    PR Interval:    QRS Duration:    QT Interval:    QTC Calculation:   R Axis:      Text Interpretation:      Physical Exam:   VS:  LMP  (LMP Unknown)    Wt Readings from Last 3 Encounters:  01/14/23 172 lb 6.4 oz (78.2 kg)  12/29/22 168 lb (76.2 kg)  12/22/22 168 lb 12.8 oz (76.6 kg)    GEN: Well nourished, well developed in no acute distress NECK: No JVD; No carotid bruits CARDIAC: ***RRR, no murmurs, rubs, gallops RESPIRATORY:  Clear to auscultation without rales, wheezing or rhonchi  ABDOMEN: Soft, non-tender, non-distended EXTREMITIES:  No edema; No deformity   ASSESSMENT AND PLAN: .   ***    {Are you ordering a CV Procedure (e.g. stress test, cath, DCCV, TEE, etc)?   Press F2        :[id number]}    Signed, Lamarr CHRISTELLA. Jerilynn CHOL, ANP, AACC

## 2023-01-22 DIAGNOSIS — R918 Other nonspecific abnormal finding of lung field: Secondary | ICD-10-CM | POA: Diagnosis not present

## 2023-01-23 ENCOUNTER — Encounter: Payer: Self-pay | Admitting: Family Medicine

## 2023-01-23 ENCOUNTER — Ambulatory Visit: Payer: Medicare PPO | Admitting: Family Medicine

## 2023-01-23 NOTE — Progress Notes (Deleted)
 OFFICE VISIT  01/23/2023  CC: No chief complaint on file.   Patient is a 88 y.o. female who presents for 3-week follow-up erythema nodosum. A/P as of last visit: #1 erythema nodosum, significant improvement over the last week. Low suspicion that this was infection so we will not do any further antibiotic at this time. Sed rate was a little bit up.  We will recheck this upon follow-up in 3 weeks.   2.  Hypertension, significant whitecoat component. Continue hydralazine  100 mg twice a day and Lopressor  100 mg twice a day. Electrolytes and kidney function stable 1 week ago.   #3 PAF. Regular rhythm today. She takes Eliquis  5 mg twice a day. Of note, Xarelto  is currently on her med list but this is incorrect.  I have taken it off her med list today.  INTERIM HX: Admitted 12/16 to 01/14/2023 for decompensated heart failure and right lower extremity cellulitis.  Upon discharge she went back to [employer]. I reviewed the hospital data in its entirety today. Chest x-ray showed pulmonary edema, BNP very high and systolic blood pressure in the 200s.  Initially needed BiPAP and she was started on IV nitroglycerin .  She was diuresed appropriately.  Required extended course of antibiotics for cellulitis. Echocardiogram showed preserved LV systolic function. She had acute kidney injury.  Renal ultrasound showed no urinary tract dilation.  UA unremarkable. She had acute urinary retention, retaining 592 cc on day of discharge.  She was discharged home with Foley catheter with recommended outpatient urology follow-up for trial of void after 7 to 10 days.  Discharge medications: Alprazolam  0.5 mg 3 times daily as needed, amiodarone  200 mg daily, Eliquis  2.5 mg twice daily, atorvastatin  40 mg a day, biotin  5000 mcg/day, calcium  1200 mg a day, Flonase  2 sprays each nostril daily, Lasix  20 mg daily, gabapentin  100 mg 3 times daily, hydralazine  25 mg 3 times daily, hydrocortisone  2.5% cream daily as  needed to rash on feet and legs, Jardiance  10 mg a day, Lopressor  25 mg twice daily,  ROS*** Past Medical History:  Diagnosis Date   Abnormal EKG approx 2008   Nuclear stress test neg;    Acute pancreatitis 09/2020   idiopathic.  +panc psudocyst   Anxiety    with panic   CAP (community acquired pneumonia) 08/2017   Hospitalization for CAP/acute diast HF/rapid a-fib   Cataract    s/p surgery--lens implants   Chronic diastolic heart failure (HCC) 2019   Chronic renal insufficiency, stage 3 (moderate) (HCC) 12/04/2012   Renal u/s when in hosp 08/2017 for CAP/CHF showed symmetric kidneys, echogenicity normal, w/out hydronephrosis. Baseline GFR around 40 ml/min as of 10/2018.   DDD (degenerative disc disease), lumbar    Diverticulosis 2009   Fracture of radial shaft, left, closed 11/16/2010   fell down flight of stairs   History of kidney stones    Hx of adenomatous colonic polyps 2002;2009;2015   surveillance colonoscopy 2009, +polypectomy done-tubular adenoma w/out high grade.  05/2013 tubular adenomas--recall 3 yrs   Hyperlipidemia    Hypertension    Low TSH level 02/18/2016   T3 norm, T4 mildly elevated--suspected sick euthyroid syndrome.  Repeat labs 06/2016: normal.   Melanoma in situ (HCC) 06/2018   L LL   Osteoarthritis of both knees    viscosupplementation injections helpful 2020/21   Osteopenia    DEXA 08/2010; repeat DEXA 02/2015 worse: fosamax  started.  06/2018 Dexa T score -2.4.  2020 maj osteop fx risk = 24%, Hip fx risk  7.3%. 09/2020 T score -2.1   PAF (paroxysmal atrial fibrillation) (HCC) 02/2016   2018.   DCCV x 2 2024.   Peripheral edema    Pneumonia 2015   hx with sepsis   Rheumatic fever     Past Surgical History:  Procedure Laterality Date   APPENDECTOMY  1966   done during surgery for tubal pregnancy   CARDIOVERSION N/A 06/17/2022   Procedure: CARDIOVERSION;  Surgeon: Lonni Slain, MD;  Location: [facility] INVASIVE CV LAB;  Service: Cardiovascular;   Laterality: N/A;   CARDIOVERSION N/A 07/11/2022   Procedure: CARDIOVERSION;  Surgeon: Cherrie Toribio SAUNDERS, MD;  Location: [facility] INVASIVE CV LAB;  Service: Cardiovascular;  Laterality: N/A;   CATARACT EXTRACTION W/ INTRAOCULAR LENS IMPLANT  2013   bilat   COLONOSCOPY W/ POLYPECTOMY  05/2013   +diverticulosis; recall 3 yrs (Dr. Abran)   DEXA  02/2015; 06/2018   T score -2.1 in both femoral necks; FRAX 10 yr risk of major osteoporotic fracture was 21%---fosamax  started. 06/2018 T score -2.4.  T score 09/2020 -2.1. Rpt 2 yrs.   ECTOPIC PREGNANCY SURGERY     EYE SURGERY     LEFT HEART CATH AND CORONARY ANGIOGRAPHY N/A 09/01/2017   No angiographically significant CAD.  Upper normal left ventricular filling pressure.  Procedure: LEFT HEART CATH AND CORONARY ANGIOGRAPHY;  Surgeon: Mady Lonni, MD;  Location: [facility] INVASIVE CV LAB;  Service: Cardiovascular;  Laterality: N/A;   LUMBAR LAMINECTOMY/DECOMPRESSION MICRODISCECTOMY N/A 09/26/2018   Procedure: Decompressive Lumbar Laminectomy L5 S1 FORAMINOTOMY L5 S1  NERVE ROOT BILATERALLY and Microdiscectomy L5-S1 Left;  Surgeon: Heide Ingle, MD;  Location: [facility] ORS;  Service: Orthopedics;  Laterality: N/A;    OPEN REDUCTION INTERNAL FIXATION (ORIF) TIBIA/FIBULA FRACTURE Left 02/18/2016   Procedure: OPEN REDUCTION INTERNAL FIXATION (ORIF) Right ankle trimalleolar fracture;  Surgeon: Norleen Armor, MD;  Location: [facility] OR;  Service: Orthopedics;  Laterality: Left;  requests   ORIF RADIAL FRACTURE  11/18/2010   left; s/p slip on slippery floor and fell   THORACENTESIS  08/2017   diagnostic and therapeutic.  Transudative.  Clx neg.  (+pulm edema/diastolic HF)   TONSILLECTOMY     TRANSTHORACIC ECHOCARDIOGRAM  02/18/2016; 08/10/17   LVEF of 55-60%, mild AI and mild MR and normal biatrial size.  07/2017--normal LV function, mild enlarge aortic root, mild/mod TR, bilat atrial enlargement. 06/09/22 EF 50-55%, mod MR, aortic root stable enlgmt   Social History    Socioeconomic History   Marital status: Widowed    Spouse name: Not on file   Number of children: Not on file   Years of education: Not on file   Highest education level: Not on file  Occupational History   Not on file  Tobacco Use   Smoking status: Never   Smokeless tobacco: Never  Vaping Use   Vaping status: Never Used  Substance and Sexual Activity   Alcohol  use: Yes    Comment: rarely   Drug use: Never   Sexual activity: Not Currently  Other Topics Concern   Not on file  Social History Narrative   Widow, 2 sons.   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   No tobacco.  Rare alcohol .   No drugs.  Exercise: 4 times per week, about 4mi.   Social Drivers of Corporate Investment Banker Strain: Low Risk  (10/27/2021)   Overall Financial Resource Strain (CARDIA)    Difficulty of Paying Living Expenses: Not hard at all  Food Insecurity: No Food Insecurity (01/02/2023)  Hunger Vital Sign    Worried About Running Out of Food in the Last Year: Never true    Ran Out of Food in the Last Year: Never true  Transportation Needs: No Transportation Needs (01/02/2023)   PRAPARE - Administrator, Civil Service (Medical): No    Lack of Transportation (Non-Medical): No  Physical Activity: Sufficiently Active (10/27/2021)   Exercise Vital Sign    Days of Exercise per Week: 5 days    Minutes of Exercise per Session: 30 min  Stress: No Stress Concern Present (10/27/2021)   [employer] of Occupational Health - Occupational Stress Questionnaire    Feeling of Stress : Not at all  Social Connections: Moderately Integrated (10/27/2021)   Social Connection and Isolation Panel [NHANES]    Frequency of Communication with Friends and Family: More than three times a week    Frequency of Social Gatherings with Friends and Family: More than three times a week    Attends Religious Services: More than 4 times per year    Active Member of [employer] or Organizations: Yes    Attends Tax Inspector Meetings: More than 4 times per year    Marital Status: Widowed   Outpatient Medications Prior to Visit  Medication Sig Dispense Refill   acetaminophen  (TYLENOL ) 500 MG tablet Take 2 tablets (1,000 mg total) by mouth every 8 (eight) hours as needed. 30 tablet 0   albuterol  (VENTOLIN  HFA) 108 (90 Base) MCG/ACT inhaler INHALE 2 PUFFS BY MOUTH into THE lungs EVERY 4 HOURS AS NEEDED FOR WHEEZING OR SHORTNESS OF BREATH (Patient taking differently: Inhale 2 puffs into the lungs every 4 (four) hours as needed for shortness of breath.) 18 g 1   amiodarone  (PACERONE ) 200 MG tablet Take 1 tablet (200 mg total) by mouth daily. 90 tablet 3   apixaban  (ELIQUIS ) 2.5 MG TABS tablet Take 1 tablet (2.5 mg total) by mouth 2 (two) times daily.     Apoaequorin (PREVAGEN PO) Take 1 tablet by mouth daily.     atorvastatin  (LIPITOR) 40 MG tablet TAKE 1 TABLET BY MOUTH EVERY DAY 90 tablet 1   Biotin  5000 MCG TABS Take 5,000 mcg by mouth daily.     Calcium  Carbonate (CALCIUM  600 PO) Take 1,200 mg by mouth daily.      carboxymethylcellulose (REFRESH PLUS) 0.5 % SOLN Place 2 drops into both eyes daily as needed (dry/irritated eyes.).     Coenzyme Q10 200 MG capsule Take 200 mg by mouth daily.     fluticasone  (FLONASE ) 50 MCG/ACT nasal spray Place 2 sprays into both nostrils daily.      furosemide  (LASIX ) 20 MG tablet Take 1 tablet (20 mg total) by mouth daily. 90 tablet 3   gabapentin  (NEURONTIN ) 100 MG capsule Take 1 capsule (100 mg total) by mouth 3 (three) times daily.     hydrALAZINE  (APRESOLINE ) 25 MG tablet Take 1 tablet (25 mg total) by mouth every 8 (eight) hours.     hydrocortisone  2.5 % cream APPLY DAILY AS NEEDED TO RASH ON FEET AND LEGS (Patient taking differently: Apply 1 Application topically once as needed.) 30 g 1   JARDIANCE  10 MG TABS tablet TAKE 1 TABLET BY MOUTH EVERY DAY BEFORE BREAKFAST (Patient taking differently: Take 10 mg by mouth daily.) 90 tablet 3   metoprolol  tartrate (LOPRESSOR )  25 MG tablet Take 1 tablet (25 mg total) by mouth 2 (two) times daily.     Multiple Vitamins-Minerals (CENTRUM SILVER  ULTRA WOMENS) TABS  Take 1 tablet by mouth every evening.     No facility-administered medications prior to visit.    Allergies  Allergen Reactions   Augmentin  [Amoxicillin -Pot Clavulanate] Nausea And Vomiting and Other (See Comments)    projectile vomiting Has patient had a PCN reaction causing immediate rash, facial/tongue/throat swelling, SOB or lightheadedness with hypotension:No Has patient had a PCN reaction causing severe rash involving mucus membranes or skin necrosis:No Has patient had a PCN reaction that required hospitalization:No Has patient had a PCN reaction occurring within the last 10 years:Yes If all of the above answers are NO, then may proceed with Cephalosporin use.    Amoxicillin  Rash   Clindamycin/Lincomycin Rash    Review of Systems As per HPI  PE:    01/14/2023    1:50 PM 01/14/2023    7:12 AM 01/14/2023    6:56 AM  Vitals with BMI  Systolic 132 154 850  Diastolic 61 46 55  Pulse 67 79      Physical Exam  ***  LABS:  Last CBC Lab Results  Component Value Date   WBC 8.4 01/14/2023   HGB 9.2 (L) 01/14/2023   HCT 30.2 (L) 01/14/2023   MCV 93.5 01/14/2023   MCH 28.5 01/14/2023   RDW 15.1 01/14/2023   PLT 263 01/14/2023   Last metabolic panel Lab Results  Component Value Date   GLUCOSE 98 01/14/2023   NA 139 01/14/2023   K 4.1 01/14/2023   CL 105 01/14/2023   CO2 25 01/14/2023   BUN 38 (H) 01/14/2023   CREATININE 1.57 (H) 01/14/2023   GFRNONAA 32 (L) 01/14/2023   CALCIUM  9.0 01/14/2023   PHOS 2.8 01/14/2023   PROT 5.7 (L) 01/14/2023   ALBUMIN 2.1 (L) 01/14/2023   LABGLOB 2.4 09/24/2020   AGRATIO 1.5 09/24/2020   BILITOT 0.5 01/14/2023   ALKPHOS 103 01/14/2023   AST 41 01/14/2023   ALT 26 01/14/2023   ANIONGAP 9 01/14/2023   Last hemoglobin A1c No results found for: HGBA1C Last thyroid  functions Lab  Results  Component Value Date   TSH 0.618 10/23/2022   T3TOTAL 115.0 07/04/2016   Lab Results  Component Value Date   ESRSEDRATE 60 (H) 12/22/2022   BNP    Component Value Date/Time   BNP 1,006.1 (H) 01/02/2023 0855   IMPRESSION AND PLAN:  No problem-specific Assessment & Plan notes found for this encounter.  ?rpt cxr (12/16 portable CXR with L retrocardiac opacity-->L lung infxn w/pleural effusion vs atelectasis,?left pleural effusion.  An After Visit Summary was printed and given to the patient.  FOLLOW UP: No follow-ups on file.  Signed:  Gerlene Hockey, MD           01/23/2023

## 2023-01-24 ENCOUNTER — Ambulatory Visit: Payer: Medicare PPO | Attending: Adult Health | Admitting: Adult Health

## 2023-01-24 DIAGNOSIS — D631 Anemia in chronic kidney disease: Secondary | ICD-10-CM | POA: Diagnosis not present

## 2023-01-24 DIAGNOSIS — Z6833 Body mass index (BMI) 33.0-33.9, adult: Secondary | ICD-10-CM | POA: Diagnosis not present

## 2023-01-24 DIAGNOSIS — I5032 Chronic diastolic (congestive) heart failure: Secondary | ICD-10-CM | POA: Diagnosis not present

## 2023-01-24 DIAGNOSIS — N183 Chronic kidney disease, stage 3 unspecified: Secondary | ICD-10-CM | POA: Diagnosis not present

## 2023-01-24 DIAGNOSIS — J9601 Acute respiratory failure with hypoxia: Secondary | ICD-10-CM | POA: Diagnosis not present

## 2023-01-24 DIAGNOSIS — R918 Other nonspecific abnormal finding of lung field: Secondary | ICD-10-CM | POA: Diagnosis not present

## 2023-01-24 DIAGNOSIS — R635 Abnormal weight gain: Secondary | ICD-10-CM | POA: Diagnosis not present

## 2023-01-25 DIAGNOSIS — G4710 Hypersomnia, unspecified: Secondary | ICD-10-CM | POA: Diagnosis not present

## 2023-01-25 DIAGNOSIS — W19XXXA Unspecified fall, initial encounter: Secondary | ICD-10-CM | POA: Diagnosis not present

## 2023-01-25 DIAGNOSIS — I509 Heart failure, unspecified: Secondary | ICD-10-CM | POA: Diagnosis not present

## 2023-02-01 DIAGNOSIS — N189 Chronic kidney disease, unspecified: Secondary | ICD-10-CM | POA: Diagnosis not present

## 2023-02-01 DIAGNOSIS — F411 Generalized anxiety disorder: Secondary | ICD-10-CM | POA: Diagnosis not present

## 2023-02-01 DIAGNOSIS — I509 Heart failure, unspecified: Secondary | ICD-10-CM | POA: Diagnosis not present

## 2023-02-01 DIAGNOSIS — I130 Hypertensive heart and chronic kidney disease with heart failure and stage 1 through stage 4 chronic kidney disease, or unspecified chronic kidney disease: Secondary | ICD-10-CM | POA: Diagnosis not present

## 2023-02-03 DIAGNOSIS — I509 Heart failure, unspecified: Secondary | ICD-10-CM | POA: Diagnosis not present

## 2023-02-03 DIAGNOSIS — M79672 Pain in left foot: Secondary | ICD-10-CM | POA: Diagnosis not present

## 2023-02-03 DIAGNOSIS — S90122D Contusion of left lesser toe(s) without damage to nail, subsequent encounter: Secondary | ICD-10-CM | POA: Diagnosis not present

## 2023-02-03 DIAGNOSIS — M79671 Pain in right foot: Secondary | ICD-10-CM | POA: Diagnosis not present

## 2023-02-04 ENCOUNTER — Encounter (HOSPITAL_COMMUNITY): Payer: Self-pay

## 2023-02-04 ENCOUNTER — Inpatient Hospital Stay (HOSPITAL_COMMUNITY)
Admission: EM | Admit: 2023-02-04 | Discharge: 2023-02-15 | DRG: 291 | Disposition: A | Discharge status: Skilled Nursing Facility | Payer: Medicare PPO | Source: Skilled Nursing Facility | Attending: Internal Medicine | Admitting: Internal Medicine

## 2023-02-04 ENCOUNTER — Emergency Department (HOSPITAL_COMMUNITY): Payer: Medicare PPO

## 2023-02-04 ENCOUNTER — Other Ambulatory Visit: Payer: Self-pay

## 2023-02-04 DIAGNOSIS — R0989 Other specified symptoms and signs involving the circulatory and respiratory systems: Secondary | ICD-10-CM | POA: Diagnosis not present

## 2023-02-04 DIAGNOSIS — I5082 Biventricular heart failure: Secondary | ICD-10-CM | POA: Diagnosis present

## 2023-02-04 DIAGNOSIS — F05 Delirium due to known physiological condition: Secondary | ICD-10-CM | POA: Diagnosis not present

## 2023-02-04 DIAGNOSIS — I480 Paroxysmal atrial fibrillation: Secondary | ICD-10-CM | POA: Diagnosis not present

## 2023-02-04 DIAGNOSIS — G9341 Metabolic encephalopathy: Secondary | ICD-10-CM | POA: Diagnosis not present

## 2023-02-04 DIAGNOSIS — N184 Chronic kidney disease, stage 4 (severe): Secondary | ICD-10-CM | POA: Diagnosis not present

## 2023-02-04 DIAGNOSIS — E785 Hyperlipidemia, unspecified: Secondary | ICD-10-CM | POA: Diagnosis present

## 2023-02-04 DIAGNOSIS — I4892 Unspecified atrial flutter: Secondary | ICD-10-CM | POA: Diagnosis present

## 2023-02-04 DIAGNOSIS — I672 Cerebral atherosclerosis: Secondary | ICD-10-CM | POA: Diagnosis not present

## 2023-02-04 DIAGNOSIS — Z7984 Long term (current) use of oral hypoglycemic drugs: Secondary | ICD-10-CM

## 2023-02-04 DIAGNOSIS — I5031 Acute diastolic (congestive) heart failure: Secondary | ICD-10-CM | POA: Diagnosis present

## 2023-02-04 DIAGNOSIS — R531 Weakness: Secondary | ICD-10-CM | POA: Diagnosis not present

## 2023-02-04 DIAGNOSIS — Z86006 Personal history of melanoma in-situ: Secondary | ICD-10-CM | POA: Diagnosis not present

## 2023-02-04 DIAGNOSIS — I517 Cardiomegaly: Secondary | ICD-10-CM | POA: Diagnosis not present

## 2023-02-04 DIAGNOSIS — Z79899 Other long term (current) drug therapy: Secondary | ICD-10-CM

## 2023-02-04 DIAGNOSIS — I5033 Acute on chronic diastolic (congestive) heart failure: Secondary | ICD-10-CM | POA: Diagnosis not present

## 2023-02-04 DIAGNOSIS — D649 Anemia, unspecified: Principal | ICD-10-CM

## 2023-02-04 DIAGNOSIS — Z6832 Body mass index (BMI) 32.0-32.9, adult: Secondary | ICD-10-CM

## 2023-02-04 DIAGNOSIS — R069 Unspecified abnormalities of breathing: Secondary | ICD-10-CM | POA: Diagnosis not present

## 2023-02-04 DIAGNOSIS — D631 Anemia in chronic kidney disease: Secondary | ICD-10-CM | POA: Diagnosis present

## 2023-02-04 DIAGNOSIS — I4891 Unspecified atrial fibrillation: Secondary | ICD-10-CM | POA: Diagnosis not present

## 2023-02-04 DIAGNOSIS — I50811 Acute right heart failure: Secondary | ICD-10-CM

## 2023-02-04 DIAGNOSIS — R918 Other nonspecific abnormal finding of lung field: Secondary | ICD-10-CM | POA: Diagnosis not present

## 2023-02-04 DIAGNOSIS — I509 Heart failure, unspecified: Secondary | ICD-10-CM | POA: Diagnosis not present

## 2023-02-04 DIAGNOSIS — J9601 Acute respiratory failure with hypoxia: Secondary | ICD-10-CM | POA: Diagnosis present

## 2023-02-04 DIAGNOSIS — Z8249 Family history of ischemic heart disease and other diseases of the circulatory system: Secondary | ICD-10-CM | POA: Diagnosis not present

## 2023-02-04 DIAGNOSIS — E1140 Type 2 diabetes mellitus with diabetic neuropathy, unspecified: Secondary | ICD-10-CM | POA: Diagnosis present

## 2023-02-04 DIAGNOSIS — N1832 Chronic kidney disease, stage 3b: Secondary | ICD-10-CM | POA: Diagnosis not present

## 2023-02-04 DIAGNOSIS — Z961 Presence of intraocular lens: Secondary | ICD-10-CM | POA: Diagnosis present

## 2023-02-04 DIAGNOSIS — Z881 Allergy status to other antibiotic agents status: Secondary | ICD-10-CM

## 2023-02-04 DIAGNOSIS — Z833 Family history of diabetes mellitus: Secondary | ICD-10-CM

## 2023-02-04 DIAGNOSIS — I4819 Other persistent atrial fibrillation: Secondary | ICD-10-CM | POA: Diagnosis not present

## 2023-02-04 DIAGNOSIS — J9811 Atelectasis: Secondary | ICD-10-CM | POA: Diagnosis not present

## 2023-02-04 DIAGNOSIS — I130 Hypertensive heart and chronic kidney disease with heart failure and stage 1 through stage 4 chronic kidney disease, or unspecified chronic kidney disease: Principal | ICD-10-CM | POA: Diagnosis present

## 2023-02-04 DIAGNOSIS — I700 Atherosclerosis of aorta: Secondary | ICD-10-CM | POA: Diagnosis not present

## 2023-02-04 DIAGNOSIS — Z7901 Long term (current) use of anticoagulants: Secondary | ICD-10-CM

## 2023-02-04 DIAGNOSIS — E1122 Type 2 diabetes mellitus with diabetic chronic kidney disease: Secondary | ICD-10-CM | POA: Diagnosis present

## 2023-02-04 DIAGNOSIS — E669 Obesity, unspecified: Secondary | ICD-10-CM | POA: Diagnosis present

## 2023-02-04 DIAGNOSIS — J90 Pleural effusion, not elsewhere classified: Secondary | ICD-10-CM | POA: Diagnosis not present

## 2023-02-04 DIAGNOSIS — R339 Retention of urine, unspecified: Secondary | ICD-10-CM | POA: Diagnosis present

## 2023-02-04 DIAGNOSIS — R4182 Altered mental status, unspecified: Secondary | ICD-10-CM | POA: Diagnosis not present

## 2023-02-04 DIAGNOSIS — I10 Essential (primary) hypertension: Secondary | ICD-10-CM | POA: Diagnosis not present

## 2023-02-04 DIAGNOSIS — N179 Acute kidney failure, unspecified: Secondary | ICD-10-CM | POA: Diagnosis present

## 2023-02-04 DIAGNOSIS — I959 Hypotension, unspecified: Secondary | ICD-10-CM | POA: Diagnosis not present

## 2023-02-04 DIAGNOSIS — Z1152 Encounter for screening for COVID-19: Secondary | ICD-10-CM

## 2023-02-04 DIAGNOSIS — R0602 Shortness of breath: Secondary | ICD-10-CM | POA: Diagnosis not present

## 2023-02-04 DIAGNOSIS — Z7401 Bed confinement status: Secondary | ICD-10-CM | POA: Diagnosis not present

## 2023-02-04 DIAGNOSIS — I110 Hypertensive heart disease with heart failure: Secondary | ICD-10-CM | POA: Diagnosis not present

## 2023-02-04 DIAGNOSIS — R410 Disorientation, unspecified: Secondary | ICD-10-CM

## 2023-02-04 DIAGNOSIS — R140 Abdominal distension (gaseous): Secondary | ICD-10-CM | POA: Diagnosis not present

## 2023-02-04 DIAGNOSIS — R251 Tremor, unspecified: Secondary | ICD-10-CM

## 2023-02-04 DIAGNOSIS — R0902 Hypoxemia: Secondary | ICD-10-CM | POA: Diagnosis not present

## 2023-02-04 DIAGNOSIS — Z880 Allergy status to penicillin: Secondary | ICD-10-CM

## 2023-02-04 LAB — COMPREHENSIVE METABOLIC PANEL
ALT: 21 U/L (ref 0–44)
AST: 28 U/L (ref 15–41)
Albumin: 2.7 g/dL — ABNORMAL LOW (ref 3.5–5.0)
Alkaline Phosphatase: 84 U/L (ref 38–126)
Anion gap: 7 (ref 5–15)
BUN: 31 mg/dL — ABNORMAL HIGH (ref 8–23)
CO2: 28 mmol/L (ref 22–32)
Calcium: 9.1 mg/dL (ref 8.9–10.3)
Chloride: 107 mmol/L (ref 98–111)
Creatinine, Ser: 1.72 mg/dL — ABNORMAL HIGH (ref 0.44–1.00)
GFR, Estimated: 29 mL/min — ABNORMAL LOW (ref >60)
Glucose, Bld: 106 mg/dL — ABNORMAL HIGH (ref 70–99)
Potassium: 3.7 mmol/L (ref 3.5–5.1)
Sodium: 142 mmol/L (ref 135–145)
Total Bilirubin: 0.7 mg/dL (ref 0.0–1.2)
Total Protein: 6 g/dL — ABNORMAL LOW (ref 6.5–8.1)

## 2023-02-04 LAB — GLUCOSE, CAPILLARY
Glucose-Capillary: 101 mg/dL — ABNORMAL HIGH (ref 70–99)
Glucose-Capillary: 105 mg/dL — ABNORMAL HIGH (ref 70–99)
Glucose-Capillary: 109 mg/dL — ABNORMAL HIGH (ref 70–99)

## 2023-02-04 LAB — BLOOD GAS, VENOUS
Acid-Base Excess: 2.3 mmol/L — ABNORMAL HIGH (ref 0.0–2.0)
Bicarbonate: 29.1 mmol/L — ABNORMAL HIGH (ref 20.0–28.0)
O2 Saturation: 81.9 %
Patient temperature: 37
pCO2, Ven: 54 mm[Hg] (ref 44–60)
pH, Ven: 7.34 (ref 7.25–7.43)
pO2, Ven: 49 mm[Hg] — ABNORMAL HIGH (ref 32–45)

## 2023-02-04 LAB — RETICULOCYTES
Immature Retic Fract: 17.4 % — ABNORMAL HIGH (ref 2.3–15.9)
RBC.: 2.53 MIL/uL — ABNORMAL LOW (ref 3.87–5.11)
Retic Count, Absolute: 64.8 10*3/uL (ref 19.0–186.0)
Retic Ct Pct: 2.6 % (ref 0.4–3.1)

## 2023-02-04 LAB — CBC WITH DIFFERENTIAL/PLATELET
Abs Immature Granulocytes: 0.11 10*3/uL — ABNORMAL HIGH (ref 0.00–0.07)
Basophils Absolute: 0 10*3/uL (ref 0.0–0.1)
Basophils Relative: 0 %
Eosinophils Absolute: 0.2 10*3/uL (ref 0.0–0.5)
Eosinophils Relative: 2 %
HCT: 25.9 % — ABNORMAL LOW (ref 36.0–46.0)
Hemoglobin: 7.6 g/dL — ABNORMAL LOW (ref 12.0–15.0)
Immature Granulocytes: 1 %
Lymphocytes Relative: 10 %
Lymphs Abs: 1 10*3/uL (ref 0.7–4.0)
MCH: 28.9 pg (ref 26.0–34.0)
MCHC: 29.3 g/dL — ABNORMAL LOW (ref 30.0–36.0)
MCV: 98.5 fL (ref 80.0–100.0)
Monocytes Absolute: 1.2 10*3/uL — ABNORMAL HIGH (ref 0.1–1.0)
Monocytes Relative: 11 %
Neutro Abs: 7.9 10*3/uL — ABNORMAL HIGH (ref 1.7–7.7)
Neutrophils Relative %: 76 %
Platelets: 173 10*3/uL (ref 150–400)
RBC: 2.63 MIL/uL — ABNORMAL LOW (ref 3.87–5.11)
RDW: 16.7 % — ABNORMAL HIGH (ref 11.5–15.5)
WBC: 10.4 10*3/uL (ref 4.0–10.5)
nRBC: 0 % (ref 0.0–0.2)

## 2023-02-04 LAB — RESP PANEL BY RT-PCR (RSV, FLU A&B, COVID)  RVPGX2
Influenza A by PCR: NEGATIVE
Influenza B by PCR: NEGATIVE
Resp Syncytial Virus by PCR: NEGATIVE
SARS Coronavirus 2 by RT PCR: NEGATIVE

## 2023-02-04 LAB — TYPE AND SCREEN
ABO/RH(D): A POS
Antibody Screen: NEGATIVE

## 2023-02-04 LAB — FERRITIN: Ferritin: 105 ng/mL (ref 11–307)

## 2023-02-04 LAB — HEMOGLOBIN A1C
Hgb A1c MFr Bld: 5.3 % (ref 4.8–5.6)
Mean Plasma Glucose: 105.41 mg/dL

## 2023-02-04 LAB — TROPONIN I (HIGH SENSITIVITY)
Troponin I (High Sensitivity): 20 ng/L — ABNORMAL HIGH (ref <18)
Troponin I (High Sensitivity): 20 ng/L — ABNORMAL HIGH (ref <18)

## 2023-02-04 LAB — BRAIN NATRIURETIC PEPTIDE: B Natriuretic Peptide: 546.8 pg/mL — ABNORMAL HIGH (ref 0.0–100.0)

## 2023-02-04 LAB — IRON AND TIBC
Iron: 32 ug/dL (ref 28–170)
Saturation Ratios: 12 % (ref 10.4–31.8)
TIBC: 263 ug/dL (ref 250–450)
UIBC: 231 ug/dL

## 2023-02-04 LAB — FOLATE: Folate: 17.2 ng/mL (ref >5.9)

## 2023-02-04 LAB — VITAMIN B12: Vitamin B-12: 985 pg/mL — ABNORMAL HIGH (ref 180–914)

## 2023-02-04 MED ORDER — AMIODARONE HCL 200 MG PO TABS
200.0000 mg | ORAL_TABLET | Freq: Every day | ORAL | Status: DC
  Administered 2023-02-04 – 2023-02-06 (×3): 200 mg via ORAL
  Filled 2023-02-04 (×5): qty 1

## 2023-02-04 MED ORDER — HYDRALAZINE HCL 20 MG/ML IJ SOLN: 5.0000 mg | Freq: Four times a day (QID) | INTRAMUSCULAR | Status: DC | PRN

## 2023-02-04 MED ORDER — HEPARIN SODIUM (PORCINE) 5000 UNIT/ML IJ SOLN: 5000.0000 [IU] | Freq: Three times a day (TID) | INTRAMUSCULAR | Status: DC

## 2023-02-04 MED ORDER — ALBUTEROL SULFATE (2.5 MG/3ML) 0.083% IN NEBU
2.5000 mg | INHALATION_SOLUTION | RESPIRATORY_TRACT | Status: DC | PRN
  Administered 2023-02-04 – 2023-02-05 (×3): 2.5 mg via RESPIRATORY_TRACT
  Filled 2023-02-04 (×3): qty 3

## 2023-02-04 MED ORDER — APIXABAN 2.5 MG PO TABS
2.5000 mg | ORAL_TABLET | Freq: Two times a day (BID) | ORAL | Status: DC
  Administered 2023-02-04 – 2023-02-06 (×5): 2.5 mg via ORAL
  Filled 2023-02-04 (×6): qty 1

## 2023-02-04 MED ORDER — INSULIN ASPART 100 UNIT/ML IJ SOLN
0.0000 [IU] | Freq: Every day | INTRAMUSCULAR | Status: DC
  Filled 2023-02-04: qty 0.05

## 2023-02-04 MED ORDER — ACETAMINOPHEN 650 MG RE SUPP: 650.0000 mg | Freq: Four times a day (QID) | RECTAL | Status: DC | PRN

## 2023-02-04 MED ORDER — HYDRALAZINE HCL 25 MG PO TABS: 25.0000 mg | ORAL_TABLET | Freq: Three times a day (TID) | ORAL | Status: DC

## 2023-02-04 MED ORDER — ATORVASTATIN CALCIUM 40 MG PO TABS
40.0000 mg | ORAL_TABLET | Freq: Every day | ORAL | Status: DC
  Administered 2023-02-04 – 2023-02-15 (×12): 40 mg via ORAL
  Filled 2023-02-04 (×12): qty 1

## 2023-02-04 MED ORDER — INSULIN ASPART 100 UNIT/ML IJ SOLN
0.0000 [IU] | Freq: Three times a day (TID) | INTRAMUSCULAR | Status: DC
  Administered 2023-02-07: 2 [IU] via SUBCUTANEOUS
  Administered 2023-02-08 (×2): 3 [IU] via SUBCUTANEOUS
  Administered 2023-02-09 – 2023-02-14 (×6): 2 [IU] via SUBCUTANEOUS
  Filled 2023-02-04: qty 0.15

## 2023-02-04 MED ORDER — FLUTICASONE PROPIONATE 50 MCG/ACT NA SUSP
2.0000 | Freq: Every day | NASAL | Status: DC
  Administered 2023-02-05 – 2023-02-15 (×10): 2 via NASAL
  Filled 2023-02-04: qty 16

## 2023-02-04 MED ORDER — ONDANSETRON HCL 4 MG/2ML IJ SOLN: 4.0000 mg | Freq: Four times a day (QID) | INTRAMUSCULAR | Status: DC | PRN

## 2023-02-04 MED ORDER — METOPROLOL TARTRATE 25 MG PO TABS
25.0000 mg | ORAL_TABLET | Freq: Two times a day (BID) | ORAL | Status: DC
  Administered 2023-02-04 – 2023-02-05 (×3): 25 mg via ORAL
  Filled 2023-02-04 (×3): qty 1

## 2023-02-04 MED ORDER — FUROSEMIDE 10 MG/ML IJ SOLN
40.0000 mg | Freq: Two times a day (BID) | INTRAMUSCULAR | Status: DC
  Administered 2023-02-04 – 2023-02-08 (×9): 40 mg via INTRAVENOUS
  Filled 2023-02-04 (×9): qty 4

## 2023-02-04 MED ORDER — ACETAMINOPHEN 325 MG PO TABS
650.0000 mg | ORAL_TABLET | Freq: Four times a day (QID) | ORAL | Status: DC | PRN
  Administered 2023-02-14: 650 mg via ORAL
  Filled 2023-02-04: qty 2

## 2023-02-04 MED ORDER — POTASSIUM CHLORIDE CRYS ER 20 MEQ PO TBCR
30.0000 meq | EXTENDED_RELEASE_TABLET | Freq: Every day | ORAL | Status: DC
  Administered 2023-02-04 – 2023-02-15 (×11): 30 meq via ORAL
  Filled 2023-02-04 (×11): qty 1

## 2023-02-04 MED ORDER — TRAZODONE HCL 50 MG PO TABS
25.0000 mg | ORAL_TABLET | Freq: Every evening | ORAL | Status: DC | PRN
Start: 2023-02-04 — End: 2023-02-07
  Administered 2023-02-04 – 2023-02-06 (×3): 25 mg via ORAL
  Filled 2023-02-04 (×3): qty 1

## 2023-02-04 MED ORDER — GABAPENTIN 100 MG PO CAPS
100.0000 mg | ORAL_CAPSULE | Freq: Three times a day (TID) | ORAL | Status: DC
  Administered 2023-02-04 – 2023-02-09 (×16): 100 mg via ORAL
  Filled 2023-02-04 (×16): qty 1

## 2023-02-04 MED ORDER — CALCIUM CARBONATE 1250 (500 CA) MG PO TABS
1.0000 | ORAL_TABLET | Freq: Every day | ORAL | Status: DC
  Administered 2023-02-05 – 2023-02-15 (×9): 1250 mg via ORAL
  Filled 2023-02-04 (×10): qty 1

## 2023-02-04 MED ORDER — ONDANSETRON HCL 4 MG PO TABS: 4.0000 mg | ORAL_TABLET | Freq: Four times a day (QID) | ORAL | Status: DC | PRN

## 2023-02-04 MED ORDER — FUROSEMIDE 10 MG/ML IJ SOLN
40.0000 mg | Freq: Once | INTRAMUSCULAR | Status: AC
  Administered 2023-02-04: 40 mg via INTRAVENOUS
  Filled 2023-02-04: qty 4

## 2023-02-04 NOTE — ED Provider Notes (Signed)
8:20 AM Assumed care of patient from off-going team. For more details, please see note from same day.  In brief, this is a 88 y.o. female who presented w/ SOB, hypoxic in to 60s originally, placed on CPAP with EMS. Now in ED on BiPAP, 90s% on oxygen, clinically improved but still c/o SOB. Likely CHF. Given lasix 40 mg IV.   Plan/Dispo at time of sign-out & ED Course since sign-out: [ ]  admission  BP (!) 132/53   Pulse 64   Temp 97.6 F (36.4 C) (Oral)   Resp 17   LMP  (LMP Unknown)   SpO2 99%    ED Course:   On my evaluation patient states she does feel improved but still feels like she "can't breathe." No hypoxia, satting 90% pulling Vt 900s cc on PEEP 12/5. Patient with no significant tachypnea. Consulted to medicine.     Dispo: Admitted to Dr. Kirby Crigler ------------------------------- Vivi Barrack, MD Emergency Medicine  This note was created using dictation software, which may contain spelling or grammatical errors.   Loetta Rough, MD 02/04/23 [phone]

## 2023-02-04 NOTE — H&P (Signed)
History and Physical  Brenda Matthews WUJ:[id number] DOB: [birth year] DOA: 02/04/2023  PCP: Karna Dupes, MD   Chief Complaint: Shortness of breath  HPI: Brenda Matthews is a 88 y.o. female with medical history significant for chronic renal insufficiency, chronic anemia, non-insulin-dependent diabetes hypertension, hyperlipidemia, atrial fibrillation on Eliquis and chronic diastolic heart failure being admitted to the hospital with recurrent acute hypoxic respiratory failure likely due to acute exacerbation of heart failure with preserved EF.  She was actually just discharged from the hospital 01/14/2023 after stay for right lower extremity cellulitis and heart failure decompensation.  She responded well to diuresis during that hospital stay, and completed a course of antibiotics for her cellulitis.  That hospital stay was complicated by encephalopathy felt to be caused by pain and sedating medications, she improved back to baseline after holding medications.  The patient was brought to the hospital early this morning by EMS from her nursing facility [facility], she arrived on CPAP.  Per EMS, she was hypoxic in the 60s initially, in the emergency department she was placed on BiPAP, was also given a dose of IV Lasix after lab work and chest x-ray consistent with possible heart failure exacerbation.  Currently the patient is breathing much more comfortably, she tells me that she feels a little bit short of breath but much better than previously.  Tells me that over the last 2 days, while she has been doing well since leaving the hospital, she had some progressive shortness of breath.  She states that she also had significant swelling in her lower extremities feels like she had the swelling when she left the hospital but it got even worse, to the point that she had swelling even up to her thighs.  She denies any chest pain, she started having a nonproductive cough yesterday.  Denies any fevers or chills,  abdominal pain, nausea, or diarrhea.  Review of Systems: Please see HPI for pertinent positives and negatives. A complete 10 system review of systems are otherwise negative.  Past Medical History:  Diagnosis Date   Abnormal EKG approx 2008   Nuclear stress test neg;    Acute pancreatitis 09/2020   idiopathic.  +panc psudocyst   Anxiety    with panic   CAP (community acquired pneumonia) 08/2017   Hospitalization for CAP/acute diast HF/rapid a-fib   Cataract    s/p surgery--lens implants   Chronic diastolic heart failure (HCC) 2019   Chronic renal insufficiency, stage 3 (moderate) (HCC) 12/04/2012   Renal u/s when in hosp 08/2017 for CAP/CHF showed symmetric kidneys, echogenicity normal, w/out hydronephrosis. Baseline GFR around 40 ml/min as of 10/2018.   DDD (degenerative disc disease), lumbar    Diverticulosis 2009   Fracture of radial shaft, left, closed 11/16/2010   fell down flight of stairs   History of kidney stones    Hx of adenomatous colonic polyps 2002;2009;2015   surveillance colonoscopy 2009, +polypectomy done-tubular adenoma w/out high grade.  05/2013 tubular adenomas--recall 3 yrs   Hyperlipidemia    Hypertension    Low TSH level 02/18/2016   T3 norm, T4 mildly elevated--suspected sick euthyroid syndrome.  Repeat labs 06/2016: normal.   Melanoma in situ (HCC) 06/2018   L LL   Osteoarthritis of both knees    viscosupplementation injections helpful 2020/21   Osteopenia    DEXA 08/2010; repeat DEXA 02/2015 worse: fosamax started.  06/2018 Dexa T score -2.4.  2020 maj osteop fx risk = 24%, Hip fx risk 7.3%. 09/2020 T  score -2.1   PAF (paroxysmal atrial fibrillation) (HCC) 02/2016   2018.   DCCV x 2 2024.   Peripheral edema    Pneumonia 2015   hx with sepsis   Rheumatic fever    Past Surgical History:  Procedure Laterality Date   APPENDECTOMY  1966   done during surgery for tubal pregnancy   CARDIOVERSION N/A 06/17/2022   Procedure: CARDIOVERSION;  Surgeon:  Jodelle Red, MD;  Location: [facility] INVASIVE CV LAB;  Service: Cardiovascular;  Laterality: N/A;   CARDIOVERSION N/A 07/11/2022   Procedure: CARDIOVERSION;  Surgeon: Dolores Patty, MD;  Location: [facility] INVASIVE CV LAB;  Service: Cardiovascular;  Laterality: N/A;   CATARACT EXTRACTION W/ INTRAOCULAR LENS IMPLANT  2013   bilat   COLONOSCOPY W/ POLYPECTOMY  05/2013   +diverticulosis; recall 3 yrs (Dr. Marina Goodell)   DEXA  02/2015; 06/2018   T score -2.1 in both femoral necks; FRAX 10 yr risk of major osteoporotic fracture was 21%---fosamax started. 06/2018 T score -2.4.  T score 09/2020 -2.1. Rpt 2 yrs.   ECTOPIC PREGNANCY SURGERY     EYE SURGERY     LEFT HEART CATH AND CORONARY ANGIOGRAPHY N/A 09/01/2017   No angiographically significant CAD.  Upper normal left ventricular filling pressure.  Procedure: LEFT HEART CATH AND CORONARY ANGIOGRAPHY;  Surgeon: Yvonne Kendall, MD;  Location: [facility] INVASIVE CV LAB;  Service: Cardiovascular;  Laterality: N/A;   LUMBAR LAMINECTOMY/DECOMPRESSION MICRODISCECTOMY N/A 09/26/2018   Procedure: Decompressive Lumbar Laminectomy L5 S1 FORAMINOTOMY L5 S1  NERVE ROOT BILATERALLY and Microdiscectomy L5-S1 Left;  Surgeon: Ranee Gosselin, MD;  Location: [facility] ORS;  Service: Orthopedics;  Laterality: N/A;    OPEN REDUCTION INTERNAL FIXATION (ORIF) TIBIA/FIBULA FRACTURE Left 02/18/2016   Procedure: OPEN REDUCTION INTERNAL FIXATION (ORIF) Right ankle trimalleolar fracture;  Surgeon: Toni Arthurs, MD;  Location: [facility] OR;  Service: Orthopedics;  Laterality: Left;  requests   ORIF RADIAL FRACTURE  11/18/2010   left; s/p slip on slippery floor and fell   THORACENTESIS  08/2017   diagnostic and therapeutic.  Transudative.  Clx neg.  (+pulm edema/diastolic HF)   TONSILLECTOMY     TRANSTHORACIC ECHOCARDIOGRAM  02/18/2016; 08/10/17   LVEF of 55-60%, mild AI and mild MR and normal biatrial size.  07/2017--normal LV function, mild enlarge aortic root, mild/mod TR, bilat atrial  enlargement. 06/09/22 EF 50-55%, mod MR, aortic root stable enlgmt.  12/2022 normal LV syst fxn, grd I DD, stable mild MR and aortic root 40 mm   Social History:  reports that she has never smoked. She has never used smokeless tobacco. She reports current alcohol use. She reports that she does not use drugs.  Allergies  Allergen Reactions   Augmentin [Amoxicillin-Pot Clavulanate] Nausea And Vomiting and Other (See Comments)    "projectile vomiting" Has patient had a PCN reaction causing immediate rash, facial/tongue/throat swelling, SOB or lightheadedness with hypotension:No Has patient had a PCN reaction causing severe rash involving mucus membranes or skin necrosis:No Has patient had a PCN reaction that required hospitalization:No Has patient had a PCN reaction occurring within the last 10 years:Yes If all of the above answers are "NO", then may proceed with Cephalosporin use.    Amoxicillin Rash   Clindamycin/Lincomycin Rash    Family History  Problem Relation Age of Onset   Heart disease Mother    Heart disease Father    Hypertension Brother    Diabetes Sister    Colon cancer Neg Hx    Pancreatic cancer Neg Hx  Rectal cancer Neg Hx    Stomach cancer Neg Hx      Prior to Admission medications   Medication Sig Start Date End Date Taking? Authorizing Provider  acetaminophen (TYLENOL) 500 MG tablet Take 2 tablets (1,000 mg total) by mouth every 8 (eight) hours as needed. 01/14/23   Zigmund Daniel., MD  albuterol (VENTOLIN HFA) 108 (90 Base) MCG/ACT inhaler INHALE 2 PUFFS BY MOUTH into THE lungs EVERY 4 HOURS AS NEEDED FOR WHEEZING OR SHORTNESS OF BREATH Patient taking differently: Inhale 2 puffs into the lungs every 4 (four) hours as needed for shortness of breath. 10/12/22   McGowen, Maryjean Morn, MD  amiodarone (PACERONE) 200 MG tablet Take 1 tablet (200 mg total) by mouth daily. 08/04/22   Ronney Asters, NP  apixaban (ELIQUIS) 2.5 MG TABS tablet Take 1 tablet (2.5 mg  total) by mouth 2 (two) times daily. 01/14/23   Zigmund Daniel., MD  Apoaequorin (PREVAGEN PO) Take 1 tablet by mouth daily.    [provider]  atorvastatin (LIPITOR) 40 MG tablet TAKE 1 TABLET BY MOUTH EVERY DAY 11/02/22   McGowen, Maryjean Morn, MD  Biotin 5000 MCG TABS Take 5,000 mcg by mouth daily.    [provider]  Calcium Carbonate (CALCIUM 600 PO) Take 1,200 mg by mouth daily.     [provider]  carboxymethylcellulose (REFRESH PLUS) 0.5 % SOLN Place 2 drops into both eyes daily as needed (dry/irritated eyes.).    [provider]  Coenzyme Q10 200 MG capsule Take 200 mg by mouth daily.    [provider]  fluticasone (FLONASE) 50 MCG/ACT nasal spray Place 2 sprays into both nostrils daily.     [provider]  furosemide (LASIX) 20 MG tablet Take 1 tablet (20 mg total) by mouth daily. 08/04/22   Ronney Asters, NP  gabapentin (NEURONTIN) 100 MG capsule Take 1 capsule (100 mg total) by mouth 3 (three) times daily. 01/14/23   Zigmund Daniel., MD  hydrALAZINE (APRESOLINE) 25 MG tablet Take 1 tablet (25 mg total) by mouth every 8 (eight) hours. 01/14/23   Zigmund Daniel., MD  hydrocortisone 2.5 % cream APPLY DAILY AS NEEDED TO RASH ON FEET AND LEGS Patient taking differently: Apply 1 Application topically once as needed. 12/27/22   Jodelle Gross, NP  JARDIANCE 10 MG TABS tablet TAKE 1 TABLET BY MOUTH EVERY DAY BEFORE BREAKFAST Patient taking differently: Take 10 mg by mouth daily. 11/16/22   Alver Sorrow, NP  metoprolol tartrate (LOPRESSOR) 25 MG tablet Take 1 tablet (25 mg total) by mouth 2 (two) times daily. 01/14/23 02/13/23  Zigmund Daniel., MD  Multiple Vitamins-Minerals (CENTRUM SILVER ULTRA WOMENS) TABS Take 1 tablet by mouth every evening.    [provider]    Physical Exam: BP (!) 132/53   Pulse 64   Temp 97.6 F (36.4 C) (Oral)   Resp 17   LMP  (LMP Unknown)   SpO2 99%   General:  Alert, oriented, calm, in no acute distress, resting on BiPAP in the ER.  She is pleasant and cooperative, conversant. Cardiovascular: RRR, no murmurs or rubs, she has 2+ pitting bilateral lower extremity edema to above the knees Respiratory: Distant breath sounds with some diffuse rhonchi, diminished especially at the bases Abdomen: soft, nontender, nondistended, normal bowel tones heard  Skin: dry, no rashes  Musculoskeletal: no joint effusions, normal range of motion  Psychiatric: appropriate affect, normal speech  Neurologic: extraocular muscles intact, clear speech, moving all extremities with intact sensorium         Labs on Admission:  Basic Metabolic Panel: Recent Labs  Lab 02/04/23 0550  NA 142  K 3.7  CL 107  CO2 28  GLUCOSE 106*  BUN 31*  CREATININE 1.72*  CALCIUM 9.1   Liver Function Tests: Recent Labs  Lab 02/04/23 0550  AST 28  ALT 21  ALKPHOS 84  BILITOT 0.7  PROT 6.0*  ALBUMIN 2.7*   No results for input(s): "LIPASE", "AMYLASE" in the last 168 hours. No results for input(s): "AMMONIA" in the last 168 hours. CBC: Recent Labs  Lab 02/04/23 0550  WBC 10.4  NEUTROABS 7.9*  HGB 7.6*  HCT 25.9*  MCV 98.5  PLT 173   Cardiac Enzymes: No results for input(s): "CKTOTAL", "CKMB", "CKMBINDEX", "TROPONINI" in the last 168 hours. BNP (last 3 results) Recent Labs    10/22/22 1721 01/02/23 0855 02/04/23 0555  BNP 176.7* 1,006.1* 546.8*    ProBNP (last 3 results) No results for input(s): "PROBNP" in the last 8760 hours.  CBG: No results for input(s): "GLUCAP" in the last 168 hours.  Radiological Exams on Admission: DG Chest Port 1 View Result Date: 02/04/2023 CLINICAL DATA:  88 year old female with history of sepsis. EXAM: PORTABLE CHEST 1 VIEW COMPARISON:  Chest x-ray 01/02/2023. FINDINGS: Lung volumes are low. Bibasilar opacities (left-greater-than-right) which may reflect areas of atelectasis and/or consolidation, with superimposed  small right and moderate left pleural effusions. No pneumothorax. There is cephalization of the pulmonary vasculature and slight indistinctness of the interstitial markings suggestive of mild pulmonary edema. Mild cardiomegaly. The patient is rotated to the left on today's exam, resulting in distortion of the mediastinal contours and reduced diagnostic sensitivity and specificity for mediastinal pathology. Atherosclerotic calcifications are noted in the thoracic aorta. IMPRESSION: 1. The appearance the chest is most suggestive of congestive heart failure, as above. 2. Extensive bibasilar areas of atelectasis and/or consolidation. 3. Aortic atherosclerosis. Electronically Signed   By: Trudie Reed M.D.   On: 02/04/2023 06:36   Assessment/Plan Brenda Matthews is a 88 y.o. female with medical history significant for chronic renal insufficiency, chronic anemia, hypertension, hyperlipidemia, atrial fibrillation on Eliquis and chronic diastolic heart failure being admitted to the hospital with recurrent acute hypoxic respiratory failure likely due to acute exacerbation of heart failure with preserved EF.   Acute hypoxic respiratory failure-most likely due to acute exacerbation of heart failure with preserved EF.  Given elevated BNP, lower extremity edema, and improvement with BiPAP and diuretic. -Inpatient admission to stepdown -Continue BiPAP for now, wean as tolerated -Treat heart failure as below  Acute on chronic heart failure with preserved EF-BNP elevated to 500, though improved from previous.  Note 2D echo performed 01/03/2023 with preserved EF and grade 1 diastolic dysfunction. -Lopressor 25 mg p.o. twice daily -IV Lasix 40 mg twice daily, with potassium supplementation -Monitor daily ins and outs -Heart healthy carb modified diet  Type 2 diabetes-hold Jardiance, monitor blood sugars before every meal and at bedtime with moderate dose sliding scale.  Hypertension-continue home Lopressor and p.o.  hydralazine, with holding parameters in the setting of diuresis  Anemia-seems this is a new problem for her in the last month, patient denies any signs or symptoms of bleeding.  This is concerning especially in the setting of anticoagulation. -Check anemia panel -Check Hemoccult stool for blood -Monitor hemoglobin daily while in the hospital -Will continue anticoagulants for now, but may need to  be held if anemia worsens or if there is evidence of acute bleeding  Urinary retention-was discharged home with Foley catheter, will continue this.  Plan for outpatient urology follow-up  CKD stage III-renal function appears to be at baseline  Atrial fibrillation-continue amiodarone, metoprolol, Eliquis 2.5 mg p.o. twice daily  Hyperlipidemia-Lipitor  Peripheral neuropathy-Neurontin  DVT prophylaxis: Eliquis    Code Status: Full Code  Consults called: None  Admission status: The appropriate patient status for this patient is INPATIENT. Inpatient status is judged to be reasonable and necessary in order to provide the required intensity of service to ensure the patient's safety. The patient's presenting symptoms, physical exam findings, and initial radiographic and laboratory data in the context of their chronic comorbidities is felt to place them at high risk for further clinical deterioration. Furthermore, it is not anticipated that the patient will be medically stable for discharge from the hospital within 2 midnights of admission.    I certify that at the point of admission it is my clinical judgment that the patient will require inpatient hospital care spanning beyond 2 midnights from the point of admission due to high intensity of service, high risk for further deterioration and high frequency of surveillance required  Time spent: 59 minutes  Emeli Goguen Sharlette Dense MD [facility] Pager [phone]  If 7PM-7AM, please contact night-coverage [email or web address] Password [facility]  02/04/2023,  8:27 AM

## 2023-02-04 NOTE — Progress Notes (Signed)
Pt took off bipap. Placed on 4l [place]. Bipap left on standby in rm

## 2023-02-04 NOTE — Plan of Care (Signed)

## 2023-02-04 NOTE — ED Triage Notes (Signed)
Pt. BIB [facility] from [facility]. Pt arrive on CPAP. Given 2 nitroglycerin en route. Sense of impending doom noted by EMS on arrival. Absent lung sounds in all fields.

## 2023-02-04 NOTE — ED Provider Notes (Signed)
[place] EMERGENCY DEPARTMENT AT [facility] Provider Note   CSN: [id number] Arrival date & time: 02/04/23  0544     History  Chief Complaint  Patient presents with   Respiratory Distress    POET LINKE is a 88 y.o. female.  The history is provided by the EMS personnel and the patient. The history is limited by the condition of the patient.  Shortness of Breath Severity:  Severe Onset quality:  Gradual Timing:  Constant Progression:  Worsening Chronicity:  Recurrent Context: not URI   Relieved by:  Nothing Worsened by:  Nothing Ineffective treatments: nitroglycerin. Associated symptoms: no fever   Associated symptoms comment:  BLE edema  Risk factors: no recent surgery   Patient with CHF presents with SOB and edema.      Past Medical History:  Diagnosis Date   Abnormal EKG approx 2008   Nuclear stress test neg;    Acute pancreatitis 09/2020   idiopathic.  +panc psudocyst   Anxiety    with panic   CAP (community acquired pneumonia) 08/2017   Hospitalization for CAP/acute diast HF/rapid a-fib   Cataract    s/p surgery--lens implants   Chronic diastolic heart failure (HCC) 2019   Chronic renal insufficiency, stage 3 (moderate) (HCC) 12/04/2012   Renal u/s when in hosp 08/2017 for CAP/CHF showed symmetric kidneys, echogenicity normal, w/out hydronephrosis. Baseline GFR around 40 ml/min as of 10/2018.   DDD (degenerative disc disease), lumbar    Diverticulosis 2009   Fracture of radial shaft, left, closed 11/16/2010   fell down flight of stairs   History of kidney stones    Hx of adenomatous colonic polyps 2002;2009;2015   surveillance colonoscopy 2009, +polypectomy done-tubular adenoma w/out high grade.  05/2013 tubular adenomas--recall 3 yrs   Hyperlipidemia    Hypertension    Low TSH level 02/18/2016   T3 norm, T4 mildly elevated--suspected sick euthyroid syndrome.  Repeat labs 06/2016: normal.   Melanoma in situ (HCC) 06/2018   L LL    Osteoarthritis of both knees    viscosupplementation injections helpful 2020/21   Osteopenia    DEXA 08/2010; repeat DEXA 02/2015 worse: fosamax started.  06/2018 Dexa T score -2.4.  2020 maj osteop fx risk = 24%, Hip fx risk 7.3%. 09/2020 T score -2.1   PAF (paroxysmal atrial fibrillation) (HCC) 02/2016   2018.   DCCV x 2 2024.   Peripheral edema    Pneumonia 2015   hx with sepsis   Rheumatic fever      Home Medications Prior to Admission medications   Medication Sig Start Date End Date Taking? Authorizing Provider  acetaminophen (TYLENOL) 500 MG tablet Take 2 tablets (1,000 mg total) by mouth every 8 (eight) hours as needed. 01/14/23   Zigmund Daniel., MD  albuterol (VENTOLIN HFA) 108 (90 Base) MCG/ACT inhaler INHALE 2 PUFFS BY MOUTH into THE lungs EVERY 4 HOURS AS NEEDED FOR WHEEZING OR SHORTNESS OF BREATH Patient taking differently: Inhale 2 puffs into the lungs every 4 (four) hours as needed for shortness of breath. 10/12/22   McGowen, Maryjean Morn, MD  amiodarone (PACERONE) 200 MG tablet Take 1 tablet (200 mg total) by mouth daily. 08/04/22   Ronney Asters, NP  apixaban (ELIQUIS) 2.5 MG TABS tablet Take 1 tablet (2.5 mg total) by mouth 2 (two) times daily. 01/14/23   Zigmund Daniel., MD  Apoaequorin (PREVAGEN PO) Take 1 tablet by mouth daily.    [provider]  atorvastatin (  LIPITOR) 40 MG tablet TAKE 1 TABLET BY MOUTH EVERY DAY 11/02/22   McGowen, Maryjean Morn, MD  Biotin 5000 MCG TABS Take 5,000 mcg by mouth daily.    [provider]  Calcium Carbonate (CALCIUM 600 PO) Take 1,200 mg by mouth daily.     [provider]  carboxymethylcellulose (REFRESH PLUS) 0.5 % SOLN Place 2 drops into both eyes daily as needed (dry/irritated eyes.).    [provider]  Coenzyme Q10 200 MG capsule Take 200 mg by mouth daily.    [provider]  fluticasone (FLONASE) 50 MCG/ACT nasal spray Place 2 sprays into both nostrils daily.     [provider]  furosemide (LASIX) 20 MG tablet Take 1 tablet (20 mg total) by mouth daily. 08/04/22   Ronney Asters, NP  gabapentin (NEURONTIN) 100 MG capsule Take 1 capsule (100 mg total) by mouth 3 (three) times daily. 01/14/23   Zigmund Daniel., MD  hydrALAZINE (APRESOLINE) 25 MG tablet Take 1 tablet (25 mg total) by mouth every 8 (eight) hours. 01/14/23   Zigmund Daniel., MD  hydrocortisone 2.5 % cream APPLY DAILY AS NEEDED TO RASH ON FEET AND LEGS Patient taking differently: Apply 1 Application topically once as needed. 12/27/22   Jodelle Gross, NP  JARDIANCE 10 MG TABS tablet TAKE 1 TABLET BY MOUTH EVERY DAY BEFORE BREAKFAST Patient taking differently: Take 10 mg by mouth daily. 11/16/22   Alver Sorrow, NP  metoprolol tartrate (LOPRESSOR) 25 MG tablet Take 1 tablet (25 mg total) by mouth 2 (two) times daily. 01/14/23 02/13/23  Zigmund Daniel., MD  Multiple Vitamins-Minerals (CENTRUM SILVER ULTRA WOMENS) TABS Take 1 tablet by mouth every evening.    [provider]      Allergies    Augmentin [amoxicillin-pot clavulanate], Amoxicillin, and Clindamycin/lincomycin    Review of Systems   Review of Systems  Unable to perform ROS: Acuity of condition  Constitutional:  Negative for fever.  HENT:  Negative for facial swelling.   Respiratory:  Positive for shortness of breath.   Cardiovascular:  Positive for leg swelling.    Physical Exam Updated Vital Signs BP (!) 168/74   Pulse 76   Temp 97.6 F (36.4 C) (Oral)   Resp (!) 33   LMP  (LMP Unknown)   SpO2 (!) 83%  Physical Exam Vitals and nursing note reviewed. Exam conducted with a chaperone present.  Constitutional:      Appearance: She is well-developed. She is not diaphoretic.  HENT:     Head: Normocephalic and atraumatic.     Nose: Nose normal.  Eyes:     Pupils: Pupils are equal, round, and reactive to light.  Cardiovascular:     Rate and Rhythm: Normal rate and regular rhythm.      Pulses: Normal pulses.     Heart sounds: Normal heart sounds.  Pulmonary:     Effort: Pulmonary effort is normal. No respiratory distress.     Breath sounds: Rales present.  Abdominal:     General: Bowel sounds are normal. There is no distension.     Palpations: Abdomen is soft.     Tenderness: There is no abdominal tenderness. There is no guarding or rebound.  Musculoskeletal:        General: Normal range of motion.     Cervical back: Neck supple.     Right lower leg: Edema present.     Left lower leg: Edema present.  Skin:    General: Skin is warm and dry.     Capillary Refill: Capillary refill takes less than 2 seconds.     Findings: No erythema or rash.  Neurological:     General: No focal deficit present.     Mental Status: She is alert.     Deep Tendon Reflexes: Reflexes normal.  Psychiatric:        Mood and Affect: Mood normal.     ED Results / Procedures / Treatments   Labs (all labs ordered are listed, but only abnormal results are displayed) Results for orders placed or performed during the hospital encounter of 02/04/23  CBC with Differential   Collection Time: 02/04/23  5:50 AM  Result Value Ref Range   WBC 10.4 4.0 - 10.5 K/uL   RBC 2.63 (L) 3.87 - 5.11 MIL/uL   Hemoglobin 7.6 (L) 12.0 - 15.0 g/dL   HCT 10.2 (L) 72.5 - 36.6 %   MCV 98.5 80.0 - 100.0 fL   MCH 28.9 26.0 - 34.0 pg   MCHC 29.3 (L) 30.0 - 36.0 g/dL   RDW 44.0 (H) 34.7 - 42.5 %   Platelets 173 150 - 400 K/uL   nRBC 0.0 0.0 - 0.2 %   Neutrophils Relative % 76 %   Neutro Abs 7.9 (H) 1.7 - 7.7 K/uL   Lymphocytes Relative 10 %   Lymphs Abs 1.0 0.7 - 4.0 K/uL   Monocytes Relative 11 %   Monocytes Absolute 1.2 (H) 0.1 - 1.0 K/uL   Eosinophils Relative 2 %   Eosinophils Absolute 0.2 0.0 - 0.5 K/uL   Basophils Relative 0 %   Basophils Absolute 0.0 0.0 - 0.1 K/uL   Immature Granulocytes 1 %   Abs Immature Granulocytes 0.11 (H) 0.00 - 0.07 K/uL  Blood gas, venous (at [facility] and [facility])   Collection  Time: 02/04/23  5:56 AM  Result Value Ref Range   pH, Ven 7.34 7.25 - 7.43   pCO2, Ven 54 44 - 60 mmHg   pO2, Ven 49 (H) 32 - 45 mmHg   Bicarbonate 29.1 (H) 20.0 - 28.0 mmol/L   Acid-Base Excess 2.3 (H) 0.0 - 2.0 mmol/L   O2 Saturation 81.9 %   Patient temperature 37.0    DG Chest Port 1 View Result Date: 02/04/2023 CLINICAL DATA:  88 year old female with history of sepsis. EXAM: PORTABLE CHEST 1 VIEW COMPARISON:  Chest x-ray 01/02/2023. FINDINGS: Lung volumes are low. Bibasilar opacities (left-greater-than-right) which may reflect areas of atelectasis and/or consolidation, with superimposed small right and moderate left pleural effusions. No pneumothorax. There is cephalization of the pulmonary vasculature and slight indistinctness of the interstitial markings suggestive of mild pulmonary edema. Mild cardiomegaly. The patient is rotated to the left on today's exam, resulting in distortion of the mediastinal contours and reduced diagnostic sensitivity and specificity for mediastinal pathology. Atherosclerotic calcifications are noted in the thoracic aorta. IMPRESSION: 1. The appearance the chest is most suggestive of congestive heart failure, as above. 2. Extensive bibasilar areas of atelectasis and/or consolidation. 3. Aortic atherosclerosis. Electronically Signed   By: Trudie Reed M.D.   On: 02/04/2023 06:36   US RENAL Result Date: 01/12/2023 CLINICAL DATA:  Acute kidney injury EXAM: RENAL / URINARY TRACT ULTRASOUND COMPLETE COMPARISON:  CT abdomen and pelvis dated 05/20/2021 FINDINGS: Right Kidney: Length = 9.2 cm Normal parenchymal echogenicity with preserved corticomedullary differentiation. Lower pole simple cyst measures 2.7 x 2.2 x 1.7 cm no urinary tract dilation or shadowing calculi. The  ureter is not seen. Left Kidney: Length = 9.9 cm Normal parenchymal echogenicity with preserved corticomedullary differentiation. No urinary tract dilation or shadowing calculi. The ureter is not seen.  Bladder: Bladder decompressed with urinary catheter in-situ. Other: None. IMPRESSION: 1. No urinary tract dilation or shadowing calculi. Normal renal echogenicity. 2. Bladder decompressed with urinary catheter in-situ. Electronically Signed   By: Agustin Cree M.D.   On: 01/12/2023 20:17    EKG EKG Interpretation Date/Time:  Saturday February 04 2023 06:02:19 EST Ventricular Rate:  73 PR Interval:  63 QRS Duration:  105 QT Interval:  446 QTC Calculation: 492 R Axis:   -34  Text Interpretation: Sinus rhythm Left axis deviation Confirmed by Lamonta Cypress ([id number]) on 02/04/2023 6:26:55 AM  Radiology DG Chest Port 1 View Result Date: 02/04/2023 CLINICAL DATA:  88 year old female with history of sepsis. EXAM: PORTABLE CHEST 1 VIEW COMPARISON:  Chest x-ray 01/02/2023. FINDINGS: Lung volumes are low. Bibasilar opacities (left-greater-than-right) which may reflect areas of atelectasis and/or consolidation, with superimposed small right and moderate left pleural effusions. No pneumothorax. There is cephalization of the pulmonary vasculature and slight indistinctness of the interstitial markings suggestive of mild pulmonary edema. Mild cardiomegaly. The patient is rotated to the left on today's exam, resulting in distortion of the mediastinal contours and reduced diagnostic sensitivity and specificity for mediastinal pathology. Atherosclerotic calcifications are noted in the thoracic aorta. IMPRESSION: 1. The appearance the chest is most suggestive of congestive heart failure, as above. 2. Extensive bibasilar areas of atelectasis and/or consolidation. 3. Aortic atherosclerosis. Electronically Signed   By: Trudie Reed M.D.   On: 02/04/2023 06:36    Procedures .Critical Care  Performed by: Cy Blamer, MD Authorized by: Cy Blamer, MD   Critical care provider statement:    Critical care time (minutes):  60   Critical care was necessary to treat or prevent imminent or life-threatening  deterioration of the following conditions:  Cardiac failure   Critical care was time spent personally by me on the following activities:  Re-evaluation of patient's condition, pulse oximetry, ordering and review of radiographic studies, ordering and review of laboratory studies, ordering and performing treatments and interventions, examination of patient and review of old charts     Medications Ordered in ED Medications  furosemide (LASIX) injection 40 mg (has no administration in time range)    ED Course/ Medical Decision Making/ A&P                                 Medical Decision Making Patient with SOB and edema not responsive to NTG  Amount and/or Complexity of Data Reviewed Independent Historian: EMS    Details: See above  External Data Reviewed: notes.    Details: Previous notes reviewed  Labs: ordered.    Details: White count normal 10.4, hemoglobin low 7.6, normal platelets  Radiology: ordered and independent interpretation performed.    Details: CHF by me on CXR  Risk Prescription drug management.    Final Clinical Impression(s) / ED Diagnoses Final diagnoses:  Anemia, unspecified type  Acute right-sided congestive heart failure [facility])   Signed out to Dr. Jearld Fenton pending labs will need admission  Rx / DC Orders ED Discharge Orders     None         Joaquim Tolen, MD 02/04/23 [phone]

## 2023-02-04 NOTE — ED Notes (Addendum)
At pts request and her permission, son Symara Boccuzzi was called and given an update. He is aware she will require a S/D bed when one comes available. Also, aware he can call and be given updates from ED or [place] since mother has given permission.

## 2023-02-05 DIAGNOSIS — I5031 Acute diastolic (congestive) heart failure: Secondary | ICD-10-CM | POA: Diagnosis not present

## 2023-02-05 LAB — OCCULT BLOOD X 1 CARD TO LAB, STOOL: Fecal Occult Bld: NEGATIVE

## 2023-02-05 LAB — CBC
HCT: 26.5 % — ABNORMAL LOW (ref 36.0–46.0)
Hemoglobin: 7.5 g/dL — ABNORMAL LOW (ref 12.0–15.0)
MCH: 28.1 pg (ref 26.0–34.0)
MCHC: 28.3 g/dL — ABNORMAL LOW (ref 30.0–36.0)
MCV: 99.3 fL (ref 80.0–100.0)
Platelets: 167 10*3/uL (ref 150–400)
RBC: 2.67 MIL/uL — ABNORMAL LOW (ref 3.87–5.11)
RDW: 17 % — ABNORMAL HIGH (ref 11.5–15.5)
WBC: 9.2 10*3/uL (ref 4.0–10.5)
nRBC: 0 % (ref 0.0–0.2)

## 2023-02-05 LAB — BASIC METABOLIC PANEL
Anion gap: 5 (ref 5–15)
BUN: 29 mg/dL — ABNORMAL HIGH (ref 8–23)
CO2: 29 mmol/L (ref 22–32)
Calcium: 8.9 mg/dL (ref 8.9–10.3)
Chloride: 107 mmol/L (ref 98–111)
Creatinine, Ser: 1.55 mg/dL — ABNORMAL HIGH (ref 0.44–1.00)
GFR, Estimated: 33 mL/min — ABNORMAL LOW (ref >60)
Glucose, Bld: 97 mg/dL (ref 70–99)
Potassium: 4 mmol/L (ref 3.5–5.1)
Sodium: 141 mmol/L (ref 135–145)

## 2023-02-05 LAB — GLUCOSE, CAPILLARY
Glucose-Capillary: 110 mg/dL — ABNORMAL HIGH (ref 70–99)
Glucose-Capillary: 110 mg/dL — ABNORMAL HIGH (ref 70–99)
Glucose-Capillary: 92 mg/dL (ref 70–99)

## 2023-02-05 MED ORDER — CARMEX CLASSIC LIP BALM EX OINT
TOPICAL_OINTMENT | CUTANEOUS | Status: DC | PRN
  Filled 2023-02-05: qty 10

## 2023-02-05 MED ORDER — FUROSEMIDE 10 MG/ML IJ SOLN
40.0000 mg | Freq: Once | INTRAMUSCULAR | Status: AC
  Administered 2023-02-05: 40 mg via INTRAVENOUS
  Filled 2023-02-05: qty 4

## 2023-02-05 NOTE — Progress Notes (Signed)
Mobility Specialist - Progress Note   02/05/23 0912  Mobility  Activity Transferred from bed to chair  Level of Assistance Minimal assist, patient does 75% or more  Assistive Device Front wheel walker  Distance Ambulated (ft) 2 ft  Range of Motion/Exercises Active Assistive  Activity Response Tolerated fair  Mobility Referral Yes  Mobility visit 1 Mobility  Mobility Specialist Start Time (ACUTE ONLY) 0900  Mobility Specialist Stop Time (ACUTE ONLY) 0911  Mobility Specialist Time Calculation (min) (ACUTE ONLY) 11 min   Pt was found sitting EOB with RN in room and agreeable to transfer to recliner chair. Pt did x1 STS, felt unsteady and went to sit back down EOB. Attempt STS again and able to become more steady and transferred to recliner chair. At EOS was left on recliner chair with all needs met. Call bell I reach and chair alarm on.  Billey Chang Mobility Specialist

## 2023-02-05 NOTE — Plan of Care (Signed)
  Problem: Fluid Volume: Goal: Ability to maintain a balanced intake and output will improve Outcome: Progressing   Problem: Nutritional: Goal: Maintenance of adequate nutrition will improve Outcome: Progressing   Problem: Skin Integrity: Goal: Risk for impaired skin integrity will decrease Outcome: Progressing   Problem: Activity: Goal: Risk for activity intolerance will decrease Outcome: Progressing

## 2023-02-05 NOTE — Progress Notes (Signed)
[facility] Progress Note  Patient: Brenda Matthews     ZOX:[id number]  DOA: 02/04/2023   PCP: Karna Dupes, MD       Brief hospital course: This is an 88 year old female with atrial fibrillation, diastolic heart failure who presents to the hospital with shortness of breath, orthopnea and pedal edema up to her thighs from her nursing facility.  She presented on a CPAP and was transition to a BiPAP.  Eventually was transitioned down to 4 L of oxygen. Influenza RSV and COVID-negative  Subjective:  Very short of breath even at rest.  Assessment and Plan: Principal Problem:   Acute diastolic (congestive) heart failure (HCC) -She is tachypneic at rest and short of breath at rest-she has persistent orthopnea today - She has crackles in both bases and extensive pedal edema -Last echo was 01/06/2023 and revealed a normal EF with grade 1 diastolic dysfunction and left and right atrial dilatation - Continue IV Lasix -Jardiance on hold  Active Problems: Paroxysmal atrial fibrillation and flutter (HCC) -Continue amiodarone, metoprolol and Eliquis   CKD stage IIIb  Normocytic anemia -Anemia profile reviewed no deficiencies noted     Code Status: Full Code Total time on patient care: 35 min DVT prophylaxis:  Eliquis   Objective:   Vitals:   02/04/23 2208 02/05/23 0301 02/05/23 0440 02/05/23 0518  BP: (!) 130/53 (!) 130/54 (!) 143/69   Pulse: 63 64 60   Resp:   20   Temp: 98.4 F (36.9 C) 98.6 F (37 C) 98.3 F (36.8 C)   TempSrc: Oral Oral Oral   SpO2: 98% 98% 97%   Weight:    82.3 kg  Height:       Filed Weights   02/04/23 1400 02/05/23 0518  Weight: 82.1 kg 82.3 kg   Exam: General exam: Appears uncomfortable  HEENT: oral mucosa moist Respiratory system: Bibasilar crackles with tachypnea Cardiovascular system: S1 & S2 heard  Gastrointestinal system: Abdomen soft, non-tender, nondistended. Normal bowel sounds   Extremities: No cyanosis, clubbing -pitting  edema up to her thighs Psychiatry:  Mood & affect appropriate.      CBC: Recent Labs  Lab 02/04/23 0550 02/05/23 0530  WBC 10.4 9.2  NEUTROABS 7.9*  --   HGB 7.6* 7.5*  HCT 25.9* 26.5*  MCV 98.5 99.3  PLT 173 167   Basic Metabolic Panel: Recent Labs  Lab 02/04/23 0550 02/05/23 0530  NA 142 141  K 3.7 4.0  CL 107 107  CO2 28 29  GLUCOSE 106* 97  BUN 31* 29*  CREATININE 1.72* 1.55*  CALCIUM 9.1 8.9     Scheduled Meds:  amiodarone  200 mg Oral Daily   apixaban  2.5 mg Oral BID   atorvastatin  40 mg Oral q1800   calcium carbonate  1 tablet Oral Q breakfast   fluticasone  2 spray Each Nare Daily   furosemide  40 mg Intravenous BID   gabapentin  100 mg Oral TID   insulin aspart  0-15 Units Subcutaneous TID WC   insulin aspart  0-5 Units Subcutaneous QHS   metoprolol tartrate  25 mg Oral BID   potassium chloride  30 mEq Oral Daily    Imaging and lab data personally reviewed   Author: Calvert Cantor  02/05/2023 12:56 PM  To contact [facility]>   Check the care team in [facility] and look for the attending/consulting [facility] provider listed  Log into [email or web address] and use [place]'s universal password   Go to> "  [facility]"  and find provider  If you still have difficulty reaching the provider, please page the [facility] (Director on Call) for the Hospitalists listed on amion

## 2023-02-06 DIAGNOSIS — I5031 Acute diastolic (congestive) heart failure: Secondary | ICD-10-CM | POA: Diagnosis not present

## 2023-02-06 DIAGNOSIS — I480 Paroxysmal atrial fibrillation: Secondary | ICD-10-CM | POA: Diagnosis not present

## 2023-02-06 LAB — GLUCOSE, CAPILLARY
Glucose-Capillary: 87 mg/dL (ref 70–99)
Glucose-Capillary: 119 mg/dL — ABNORMAL HIGH (ref 70–99)
Glucose-Capillary: 103 mg/dL — ABNORMAL HIGH (ref 70–99)
Glucose-Capillary: 111 mg/dL — ABNORMAL HIGH (ref 70–99)

## 2023-02-06 MED ORDER — METOPROLOL TARTRATE 25 MG PO TABS
25.0000 mg | ORAL_TABLET | Freq: Two times a day (BID) | ORAL | Status: DC
  Administered 2023-02-06 (×2): 25 mg via ORAL
  Filled 2023-02-06 (×3): qty 1

## 2023-02-06 NOTE — Plan of Care (Signed)
  Problem: Education: Goal: Ability to describe self-care measures that may prevent or decrease complications (Diabetes Survival Skills Education) will improve Outcome: Progressing   Problem: Coping: Goal: Ability to adjust to condition or change in health will improve Outcome: Progressing   Problem: Tissue Perfusion: Goal: Adequacy of tissue perfusion will improve Outcome: Progressing   Problem: Coping: Goal: Level of anxiety will decrease Outcome: Progressing

## 2023-02-06 NOTE — Progress Notes (Signed)
SATURATION QUALIFICATIONS: (This note is used to comply with regulatory documentation for home oxygen)  Patient Saturations on Room Air at Rest = 86%  Patient Saturations on Room Air while Ambulating = patient unable to tolerate  Patient Saturations on 2 Liters of oxygen while Ambulating = 91%  Please briefly explain why patient needs home oxygen:  Patient unable to tolerate room air, sats drop to low-mid 80's w/ activity or when at rest during small exertional periods. Patient will benefit from 2 Liters of oxygen to keep oxygen saturations above 90%.

## 2023-02-06 NOTE — TOC Initial Note (Signed)
Transition of Care [facility]) - Initial/Assessment Note    Patient Details  Name: Brenda Matthews MRN: [id number] Date of Birth: January 21, 1935  Transition of Care [facility]) CM/SW Contact:    Elliot Gault, LCSW Phone Number: 02/06/2023, 2:09 PM  Clinical Narrative:                  Pt admitted from her [place] residence at [place]. She has a high readmission risk score.  Met with pt at bedside to assess. Per pt, she plans to return home at dc. She states she is independent in ADLs. She has a Occupational hygienist for DME. Pt reports that she drives and is able to get to appointments and obtain medications.   Pt does not anticipate any TOC needs for dc. TOC will follow and assist if needs arise.  Expected Discharge Plan: Home w Home Health Services Barriers to Discharge: Continued Medical Work up   Patient Goals and CMS Choice Patient states their goals for this hospitalization and ongoing recovery are:: return home          Expected Discharge Plan and Services In-house Referral: Clinical Social Work     Living arrangements for the past 2 months: Independent Living Facility                                      Prior Living Arrangements/Services Living arrangements for the past 2 months: Independent Living Facility Lives with:: Self Patient language and need for interpreter reviewed:: Yes Do you feel safe going back to the place where you live?: Yes      Need for Family Participation in Patient Care: No (Comment) Care giver support system in place?: Yes (comment) Current home services: DME Criminal Activity/Legal Involvement Pertinent to Current Situation/Hospitalization: No - Comment as needed  Activities of Daily Living   ADL Screening (condition at time of admission) Independently performs ADLs?: Yes (appropriate for developmental age) Is the patient deaf or have difficulty hearing?: No Does the patient have difficulty seeing, even when wearing glasses/contacts?: No Does the  patient have difficulty concentrating, remembering, or making decisions?: No  Permission Sought/Granted                  Emotional Assessment Appearance:: Appears stated age Attitude/Demeanor/Rapport: Engaged Affect (typically observed): Pleasant Orientation: : Oriented to Self, Oriented to Place, Oriented to  Time, Oriented to Situation Alcohol / Substance Use: Not Applicable Psych Involvement: No (comment)  Admission diagnosis:  Acute right-sided congestive heart failure (HCC) [I50.811] Acute diastolic (congestive) heart failure (HCC) [I50.31] Anemia, unspecified type [D64.9] Patient Active Problem List   Diagnosis Date Noted   Acute diastolic (congestive) heart failure (HCC) 02/04/2023   Depression 01/11/2023   Cellulitis of right leg 01/09/2023   Hypokalemia 01/09/2023   Constipation 01/09/2023   Acute pulmonary edema (HCC) 01/03/2023   Acute respiratory failure with hypoxemia (HCC) 01/02/2023   High risk medication use 11/07/2022   Acute bilateral low back pain without sciatica 11/07/2022   ARF (acute renal failure) (HCC) 10/23/2022   Acute renal failure superimposed on stage 3b chronic kidney disease (HCC) 10/22/2022   Chronic heart failure with preserved ejection fraction (HFpEF) (HCC) 10/22/2022   Elevated transaminase level 10/22/2022   Physical deconditioning 06/14/2022   Acute on chronic diastolic (congestive) heart failure (HCC) 06/11/2022   Idiopathic acute pancreatitis without necrosis or infection 09/14/2020   Pancreatic cyst 09/14/2020   Pancreatic duct dilated  09/14/2020   Pure hypercholesterolemia 09/14/2020   Hypertensive heart and chronic kidney disease with heart failure and stage 1 through stage 4 chronic kidney disease, or unspecified chronic kidney disease (HCC) 09/28/2018   Spinal stenosis, lumbar region with neurogenic claudication 09/26/2018   Osteoarthritis of right knee 11/21/2017   Acute on chronic diastolic CHF (congestive heart failure)  (HCC)    S/P thoracentesis    Acute respiratory failure (HCC)    Rash in adult 08/30/2017   Atrial fibrillation and flutter (HCC)    DOE (dyspnea on exertion) 08/29/2017   Arterial hypotension    Chronic anticoagulation 12/19/2016   AKI (acute kidney injury) (HCC)    Low TSH level 02/19/2016   PAF (paroxysmal atrial fibrillation) (HCC) 02/18/2016   Ankle fracture, left-s/p surgery 02/18/16 02/18/2016   Closed displaced trimalleolar fracture of left ankle 02/18/2016   Acute bronchitis 02/10/2015   Bronchopneumonia 12/02/2013   Chronic kidney disease, stage 3b (HCC) 12/04/2012   Hx of adenomatous colonic polyps    Routine general medical examination at a health care facility 09/05/2011   Cervical cancer screening 02/24/2011   SCIATICA, BILATERAL 09/01/2009   Sprain of sacroiliac region 09/01/2009   ANEMIA 11/07/2007   Osteopenia of the elderly 11/07/2007   INSOMNIA 11/07/2007   EDEMA 08/14/2007   Hyperlipidemia 04/04/2007   PANIC DISORDER 04/04/2007   Essential hypertension 04/04/2007   Anxiety state 12/27/2006   Arthropathy 12/27/2006   PCP:  Karna Dupes, MD Pharmacy:   [facility] - [place], [place] - [address] [place] [address] Phone: [phone] Fax: [phone]  [facility] - [place], [place] - [address] [place] [address] Phone: [phone] Fax: [phone]     Social Drivers of Health (SDOH) Social History: SDOH Screenings   Food Insecurity: No Food Insecurity (02/04/2023)  Housing: Low Risk  (02/04/2023)  Transportation Needs: No Transportation Needs (02/04/2023)  Utilities: Not At Risk (02/04/2023)  Alcohol Screen: Low Risk  (10/11/2020)  Depression (PHQ2-9): Low Risk  (09/01/2022)  Financial Resource Strain: Low Risk  (10/27/2021)  Physical Activity: Sufficiently Active (10/27/2021)  Social Connections: Moderately Integrated (02/04/2023)  Stress: No Stress Concern  Present (10/27/2021)  Tobacco Use: Low Risk  (02/04/2023)   SDOH Interventions:     Readmission Risk Interventions    02/06/2023    2:09 PM 10/24/2022    9:08 AM  Readmission Risk Prevention Plan  Post Dischage Appt  Complete  Medication Screening  Complete  Transportation Screening Complete Complete  HRI or Home Care Consult Complete   Social Work Consult for Recovery Care Planning/Counseling Complete   Palliative Care Screening Not Applicable   Medication Review Oceanographer) Complete

## 2023-02-06 NOTE — Evaluation (Signed)
Physical Therapy Evaluation Patient Details Name: Brenda Matthews MRN: [id number] DOB: 1935-09-06 Today's Date: 02/06/2023  History of Present Illness  Brenda Matthews is a 88 y.o. female admitted 02/04/23 for acute diastolic congestive heart failure. Of note, pt recently hospitalized 01/02/23 - 01/14/23 for right lower extremity cellulitis and heart failure decompensation. PMH: chronic renal insufficiency, chronic anemia, non-insulin-dependent diabetes, hyperlipidemia, HTN, afib, CHF, osteopenia  Clinical Impression  Pt admitted with above diagnosis. On eval, pt oriented to self only, pt unaware she is in hospital, requesting to go to the bistro, needing constant redirection and orientation to current hospitalization. Pt needing min A with bed mobility and transfers, noted to be impulsive and restless throughout evaluation, needing redirection to task at hand due to focused on hospital gown and heart monitor cords. Pt difficult to redirect to ambulation and not consistently following 1 step commands so amb not attempted. Per chart review, pt from rehab at [place]; Patient will benefit from continued inpatient follow up therapy, <3 hours/day at [place]. Pt currently with functional limitations due to the deficits listed below (see PT Problem List). Pt will benefit from acute skilled PT to increase their independence and safety with mobility to allow discharge.           If plan is discharge home, recommend the following: A little help with walking and/or transfers;A little help with bathing/dressing/bathroom;Assist for transportation   Can travel by private vehicle   Yes    Equipment Recommendations None recommended by PT  Recommendations for Other Services       Functional Status Assessment Patient has had a recent decline in their functional status and demonstrates the ability to make significant improvements in function in a reasonable and predictable amount of time.     Precautions / Restrictions  Precautions Precautions: Fall Restrictions Weight Bearing Restrictions Per Provider Order: No      Mobility  Bed Mobility Overal bed mobility: Needs Assistance Bed Mobility: Supine to Sit, Sit to Supine     Supine to sit: Min assist Sit to supine: Min assist   General bed mobility comments: pt attempting to mobilize to EOB but needing assist to stabilize trunk throughout transfer with LOB in all directions, min A to lift BLE back into bed and reposition back into center of bed    Transfers Overall transfer level: Needs assistance Equipment used: Rolling walker (2 wheels) Transfers: Sit to/from Stand Sit to Stand: Min assist           General transfer comment: min A to steady with powering to stand, pt impulsively reaching for bed footplate and therapist's hand to power up, maximum verbal cues for hand placement and attention to task to improve safety    Ambulation/Gait Ambulation/Gait assistance: Min assist, +2 safety/equipment   Assistive device: Rolling walker (2 wheels)         General Gait Details: encouraged pt in sidestepping at bedside but pt begins to turn RW and ambulate up to [facility], min A+2 for line management and assist to steady, pt needing cues for safety with RW management  Stairs            Wheelchair Mobility     Tilt Bed    Modified Rankin (Stroke Patients Only)       Balance Overall balance assessment: Needs assistance Sitting-balance support: Feet supported Sitting balance-Leahy Scale: Fair     Standing balance support: Reliant on assistive device for balance, Bilateral upper extremity supported, During functional activity Standing balance-Leahy Scale:  Poor                               Pertinent Vitals/Pain Pain Assessment Pain Assessment: Faces Faces Pain Scale: No hurt    Home Living Family/patient expects to be discharged to:: Assisted living ([facility] (in a home))                 Home  Equipment: Shower seat;Cane - single point;Rollator (4 wheels) Additional Comments: pt reports lives in home at ILF in [place], no family present to verify    Prior Function Prior Level of Function : Patient poor historian/Family not available             Mobility Comments: pt reports ind with rollator in [facility] facility, eats at [employer] ADLs Comments: pt reports ind with self care, 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     Extremity/Trunk Assessment   Upper Extremity Assessment Upper Extremity Assessment: Generalized weakness    Lower Extremity Assessment Lower Extremity Assessment: Generalized weakness (AROM WFL, strength grossly 3+/5, unable to perform MMT due to cognition)    Cervical / Trunk Assessment Cervical / Trunk Assessment: Kyphotic  Communication   Communication Communication: No apparent difficulties  Cognition Arousal: Alert Behavior During Therapy: Anxious, Impulsive Overall Cognitive Status: No family/caregiver present to determine baseline cognitive functioning                                 General Comments: pt able to state name and "presidential coronation on TV"; pt not oriented to being in hospital, tangential at times, reoriented frequently during evaluation, RN in room reports has needed reorientation frequently throughout the day        General Comments General comments (skin integrity, edema, etc.): pt HR 130s to 90s, RN aware    Exercises     Assessment/Plan    PT Assessment Patient needs continued PT services  PT Problem List Decreased strength;Decreased activity tolerance;Decreased balance;Decreased mobility;Decreased coordination;Decreased cognition;Decreased knowledge of use of DME;Decreased safety awareness;Cardiopulmonary status limiting activity       PT Treatment Interventions DME instruction;Gait training;Functional mobility training;Therapeutic activities;Therapeutic exercise;Balance training;Neuromuscular  re-education;Patient/family education    PT Goals (Current goals can be found in the Care Plan section)  Acute Rehab PT Goals Patient Stated Goal: none stated PT Goal Formulation: With patient Time For Goal Achievement: 02/20/23 Potential to Achieve Goals: Good    Frequency Min 1X/week     Co-evaluation               AM-PAC PT "6 Clicks" Mobility  Outcome Measure Help needed turning from your back to your side while in a flat bed without using bedrails?: A Little Help needed moving from lying on your back to sitting on the side of a flat bed without using bedrails?: A Little Help needed moving to and from a bed to a chair (including a wheelchair)?: A Little Help needed standing up from a chair using your arms (e.g., wheelchair or bedside chair)?: A Little Help needed to walk in hospital room?: A Lot Help needed climbing 3-5 steps with a railing? : Total 6 Click Score: 15    End of Session Equipment Utilized During Treatment: Oxygen Activity Tolerance: Patient tolerated treatment well Patient left: in bed;with call bell/phone within reach;with bed alarm set Nurse Communication: Mobility status;Other (comment) (HR) PT Visit Diagnosis: Unsteadiness on feet (R26.81);Muscle weakness (generalized) (M62.81)  Time: 9528-4132 PT Time Calculation (min) (ACUTE ONLY): 37 min   Charges:   PT Evaluation $PT Eval Moderate Complexity: 1 Mod PT Treatments $Therapeutic Activity: 8-22 mins PT General Charges $$ ACUTE PT VISIT: 1 Visit         Tori Averyanna Sax PT, DPT 02/06/23, 4:00 PM

## 2023-02-06 NOTE — Consult Note (Signed)
Cardiology Consultation   Patient ID: [name] MRN: [id number]; DOB: [birth year]  Admit date: 02/04/2023 Date of Consult: 02/06/2023  PCP:  Brenda Dupes, MD   [facility] Providers Cardiologist:  Chrystie Nose, MD   {  Patient Profile:   Brenda Matthews is a 88 y.o. female with a hx of chronic HFpEF, PAF, hypertension, hyperlipidemia, CKD who is being seen 02/06/2023 for the evaluation of PAF at the request of Dr. Butler Denmark.  History of Present Illness:   [name] has history of PAF and overall done well has been maintaining NSR until today.  She also has history of in 2019 but had cardiac catheterization that showed normal coronary anatomy.  She has recent admission 01/02/2023 for acute HFpEF exacerbation with right lower extremity cellulitis.  Since patient's discharge she has had progressive and worsening peripheral edema, orthopnea, shortness of breath.  She got discharged to a rehab.  There, she feels up possibly they had not been giving her Lasix routinely or she quit responding to it.  She's admitted here for CHF exacerbation and was found to be significantly hypoxic, sats in the 60s and initially placed on BiPAP however on room air.  Currently she is on IV Lasix 40 mg twice daily with good urinary output and remained in symptoms.  However last night she went into A-fib RVR with heart rates in the 110s to 120s.  Cardiology asked to help manage this.  Additionally, she has had progressive drop in hemoglobin previously normal in December's admission however now 7.5.  No obvious signs of bleeding.  Reportedly anemia panel and fecal occult negative.  Patient was discharged to rehab but lives in independent living.  She is normally functional and before her hospitalizations was ambulating three quarters of a mile daily with the senses of a walker however now has functionally declined in needs both mechanical and physical assistance to walk.  Currently she appears comfortable,  but states that she has significant orthopnea and significant fluid in her legs.  Otherwise denies any chest pain, falls, syncope, dizziness, lightheadedness.  She is asymptomatic from her A-fib.   Chest x-ray on admission indicating vascular congestion.  Troponins 20-20.  Negative respiratory panel.  Blood cultures so far negative.  BNP 546.  No new labs for today.  Yesterday creatinine 1.55.  Hemoglobin 7.5.  RBC 2.67.   Past Medical History:  Diagnosis Date   Abnormal EKG approx 2008   Nuclear stress test neg;    Acute pancreatitis 09/2020   idiopathic.  +panc psudocyst   Anxiety    with panic   CAP (community acquired pneumonia) 08/2017   Hospitalization for CAP/acute diast HF/rapid a-fib   Cataract    s/p surgery--lens implants   Chronic diastolic heart failure (HCC) 2019   Chronic renal insufficiency, stage 3 (moderate) (HCC) 12/04/2012   Renal u/s when in hosp 08/2017 for CAP/CHF showed symmetric kidneys, echogenicity normal, w/out hydronephrosis. Baseline GFR around 40 ml/min as of 10/2018.   DDD (degenerative disc disease), lumbar    Diverticulosis 2009   Fracture of radial shaft, left, closed 11/16/2010   fell down flight of stairs   History of kidney stones    Hx of adenomatous colonic polyps 2002;2009;2015   surveillance colonoscopy 2009, +polypectomy done-tubular adenoma w/out high grade.  05/2013 tubular adenomas--recall 3 yrs   Hyperlipidemia    Hypertension    Low TSH level 02/18/2016   T3 norm, T4 mildly elevated--suspected sick euthyroid syndrome.  Repeat  labs 06/2016: normal.   Melanoma in situ (HCC) 06/2018   L LL   Osteoarthritis of both knees    viscosupplementation injections helpful 2020/21   Osteopenia    DEXA 08/2010; repeat DEXA 02/2015 worse: fosamax started.  06/2018 Dexa T score -2.4.  2020 maj osteop fx risk = 24%, Hip fx risk 7.3%. 09/2020 T score -2.1   PAF (paroxysmal atrial fibrillation) (HCC) 02/2016   2018.   DCCV x 2 2024.   Peripheral edema     Pneumonia 2015   hx with sepsis   Rheumatic fever     Past Surgical History:  Procedure Laterality Date   APPENDECTOMY  1966   done during surgery for tubal pregnancy   CARDIOVERSION N/A 06/17/2022   Procedure: CARDIOVERSION;  Surgeon: Jodelle Red, MD;  Location: [facility] INVASIVE CV LAB;  Service: Cardiovascular;  Laterality: N/A;   CARDIOVERSION N/A 07/11/2022   Procedure: CARDIOVERSION;  Surgeon: Dolores Patty, MD;  Location: [facility] INVASIVE CV LAB;  Service: Cardiovascular;  Laterality: N/A;   CATARACT EXTRACTION W/ INTRAOCULAR LENS IMPLANT  2013   bilat   COLONOSCOPY W/ POLYPECTOMY  05/2013   +diverticulosis; recall 3 yrs (Dr. Marina Goodell)   DEXA  02/2015; 06/2018   T score -2.1 in both femoral necks; FRAX 10 yr risk of major osteoporotic fracture was 21%---fosamax started. 06/2018 T score -2.4.  T score 09/2020 -2.1. Rpt 2 yrs.   ECTOPIC PREGNANCY SURGERY     EYE SURGERY     LEFT HEART CATH AND CORONARY ANGIOGRAPHY N/A 09/01/2017   No angiographically significant CAD.  Upper normal left ventricular filling pressure.  Procedure: LEFT HEART CATH AND CORONARY ANGIOGRAPHY;  Surgeon: Yvonne Kendall, MD;  Location: [facility] INVASIVE CV LAB;  Service: Cardiovascular;  Laterality: N/A;   LUMBAR LAMINECTOMY/DECOMPRESSION MICRODISCECTOMY N/A 09/26/2018   Procedure: Decompressive Lumbar Laminectomy L5 S1 FORAMINOTOMY L5 S1  NERVE ROOT BILATERALLY and Microdiscectomy L5-S1 Left;  Surgeon: Ranee Gosselin, MD;  Location: [facility] ORS;  Service: Orthopedics;  Laterality: N/A;    OPEN REDUCTION INTERNAL FIXATION (ORIF) TIBIA/FIBULA FRACTURE Left 02/18/2016   Procedure: OPEN REDUCTION INTERNAL FIXATION (ORIF) Right ankle trimalleolar fracture;  Surgeon: Toni Arthurs, MD;  Location: [facility] OR;  Service: Orthopedics;  Laterality: Left;  requests   ORIF RADIAL FRACTURE  11/18/2010   left; s/p slip on slippery floor and fell   THORACENTESIS  08/2017   diagnostic and therapeutic.  Transudative.  Clx neg.   (+pulm edema/diastolic HF)   TONSILLECTOMY     TRANSTHORACIC ECHOCARDIOGRAM  02/18/2016; 08/10/17   LVEF of 55-60%, mild AI and mild MR and normal biatrial size.  07/2017--normal LV function, mild enlarge aortic root, mild/mod TR, bilat atrial enlargement. 06/09/22 EF 50-55%, mod MR, aortic root stable enlgmt.  12/2022 normal LV syst fxn, grd I DD, stable mild MR and aortic root 40 mm     Inpatient Medications: Scheduled Meds:  amiodarone  200 mg Oral Daily   apixaban  2.5 mg Oral BID   atorvastatin  40 mg Oral q1800   calcium carbonate  1 tablet Oral Q breakfast   fluticasone  2 spray Each Nare Daily   furosemide  40 mg Intravenous BID   gabapentin  100 mg Oral TID   insulin aspart  0-15 Units Subcutaneous TID WC   insulin aspart  0-5 Units Subcutaneous QHS   metoprolol tartrate  25 mg Oral BID   potassium chloride  30 mEq Oral Daily   Continuous Infusions:  PRN Meds: acetaminophen **  OR** acetaminophen, albuterol, hydrALAZINE, lip balm, ondansetron **OR** ondansetron (ZOFRAN) IV, traZODone  Allergies:    Allergies  Allergen Reactions   Augmentin [Amoxicillin-Pot Clavulanate] Nausea And Vomiting and Other (See Comments)    "projectile vomiting" Has patient had a PCN reaction causing immediate rash, facial/tongue/throat swelling, SOB or lightheadedness with hypotension:No Has patient had a PCN reaction causing severe rash involving mucus membranes or skin necrosis:No Has patient had a PCN reaction that required hospitalization:No Has patient had a PCN reaction occurring within the last 10 years:Yes If all of the above answers are "NO", then may proceed with Cephalosporin use.    Amoxicillin Rash   Clindamycin/Lincomycin Rash    Social History:   Social History   Socioeconomic History   Marital status: Widowed    Spouse name: Not on file   Number of children: Not on file   Years of education: Not on file   Highest education level: Not on file  Occupational History   Not  on file  Tobacco Use   Smoking status: Never   Smokeless tobacco: Never  Vaping Use   Vaping status: Never Used  Substance and Sexual Activity   Alcohol use: Yes    Comment: rarely   Drug use: Never   Sexual activity: Not Currently  Other Topics Concern   Not on file  Social History Narrative   Widow, 2 sons.   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   No tobacco.  Rare alcohol.   No drugs.  Exercise: 4 times per week, about 4mi.   Social Drivers of Corporate investment banker Strain: Low Risk  (10/27/2021)   Overall Financial Resource Strain (CARDIA)    Difficulty of Paying Living Expenses: Not hard at all  Food Insecurity: No Food Insecurity (02/04/2023)   Hunger Vital Sign    Worried About Running Out of Food in the Last Year: Never true    Ran Out of Food in the Last Year: Never true  Transportation Needs: No Transportation Needs (02/04/2023)   PRAPARE - Administrator, Civil Service (Medical): No    Lack of Transportation (Non-Medical): No  Physical Activity: Sufficiently Active (10/27/2021)   Exercise Vital Sign    Days of Exercise per Week: 5 days    Minutes of Exercise per Session: 30 min  Stress: No Stress Concern Present (10/27/2021)   [employer] of Occupational Health - Occupational Stress Questionnaire    Feeling of Stress : Not at all  Social Connections: Moderately Integrated (02/04/2023)   Social Connection and Isolation Panel [NHANES]    Frequency of Communication with Friends and Family: More than three times a week    Frequency of Social Gatherings with Friends and Family: More than three times a week    Attends Religious Services: More than 4 times per year    Active Member of [employer] or Organizations: Yes    Attends Banker Meetings: More than 4 times per year    Marital Status: Widowed  Intimate Partner Violence: Not At Risk (02/04/2023)   Humiliation, Afraid, Rape, and Kick questionnaire    Fear of Current or Ex-Partner: No     Emotionally Abused: No    Physically Abused: No    Sexually Abused: No    Family History:   Family History  Problem Relation Age of Onset   Heart disease Mother    Heart disease Father    Hypertension Brother    Diabetes Sister    Colon cancer Neg Hx  Pancreatic cancer Neg Hx    Rectal cancer Neg Hx    Stomach cancer Neg Hx      ROS:  Please see the history of present illness.  All other ROS reviewed and negative.     Physical Exam/Data:   Vitals:   02/05/23 2047 02/05/23 2316 02/06/23 0206 02/06/23 0559  BP: 137/88  121/60 119/78  Pulse: (!) 108  (!) 106   Resp: 16  20 20   Temp: 97.7 F (36.5 C)   98.3 F (36.8 C)  TempSrc: Oral     SpO2: 96% 92% 94% 97%  Weight:      Height:        Intake/Output Summary (Last 24 hours) at 02/06/2023 1010 Last data filed at 02/05/2023 2308 Gross per 24 hour  Intake 390 ml  Output 1950 ml  Net -1560 ml      02/05/2023    5:18 AM 02/04/2023    2:00 PM 01/14/2023    5:01 AM  Last 3 Weights  Weight (lbs) 181 lb 7 oz 181 lb 172 lb 6.4 oz  Weight (kg) 82.3 kg 82.101 kg 78.2 kg     Body mass index is 34.28 kg/m.  General:  Well nourished, well developed, in no acute distress  HEENT: normal Neck: + JVD Vascular: No carotid bruits; Distal pulses 2+ bilaterally Cardiac:  normal S1, S2; RRR; no murmur  Lungs: Diffuse crackles Abd: soft, nontender, no hepatomegaly  Ext: 2+ edema Musculoskeletal:  No deformities, BUE and BLE strength normal and equal Skin: warm and dry  Neuro:  CNs 2-12 intact, no focal abnormalities noted Psych:  Normal affect   EKG:  The EKG was personally reviewed and demonstrates: Atrial fibrillation, heart rate 118.  No acute ST-T wave changes.  Left axis deviation.  On admission was in NSR. Telemetry:  Telemetry was personally reviewed and demonstrates: Atrial fibrillation heart rates between 110s to 120s.  Relevant CV Studies: Echocardiogram 01/03/2023  1. Left ventricular ejection fraction, by  estimation, is 60 to 65%. The  left ventricle has normal function. The left ventricle has no regional  wall motion abnormalities. There is mild concentric left ventricular  hypertrophy. Left ventricular diastolic  parameters are consistent with Grade I diastolic dysfunction (impaired  relaxation).   2. Right ventricular systolic function is normal. The right ventricular  size is normal. There is normal pulmonary artery systolic pressure.   3. Left atrial size was moderately dilated.   4. Right atrial size was moderately dilated.   5. The mitral valve is normal in structure. Trivial mitral valve  regurgitation. No evidence of mitral stenosis.   6. The aortic valve is tricuspid. There is mild calcification of the  aortic valve. Aortic valve regurgitation is mild. No aortic stenosis is  present.   7. Aortic dilatation noted. There is mild dilatation of the ascending  aorta, measuring 40 mm.   8. The inferior vena cava is normal in size with <50% respiratory  variability, suggesting right atrial pressure of 8 mmHg.   Laboratory Data:  High Sensitivity Troponin:   Recent Labs  Lab 02/04/23 0550 02/04/23 0807  TROPONINIHS 20* 20*     Chemistry Recent Labs  Lab 02/04/23 0550 02/05/23 0530  NA 142 141  K 3.7 4.0  CL 107 107  CO2 28 29  GLUCOSE 106* 97  BUN 31* 29*  CREATININE 1.72* 1.55*  CALCIUM 9.1 8.9  GFRNONAA 29* 33*  ANIONGAP 7 5    Recent Labs  Lab  02/04/23 0550  PROT 6.0*  ALBUMIN 2.7*  AST 28  ALT 21  ALKPHOS 84  BILITOT 0.7   Lipids No results for input(s): "CHOL", "TRIG", "HDL", "LABVLDL", "LDLCALC", "CHOLHDL" in the last 168 hours.  Hematology Recent Labs  Lab 02/04/23 0550 02/04/23 1431 02/05/23 0530  WBC 10.4  --  9.2  RBC 2.63* 2.53* 2.67*  HGB 7.6*  --  7.5*  HCT 25.9*  --  26.5*  MCV 98.5  --  99.3  MCH 28.9  --  28.1  MCHC 29.3*  --  28.3*  RDW 16.7*  --  17.0*  PLT 173  --  167   Thyroid No results for input(s): "TSH", "FREET4" in the  last 168 hours.  BNP Recent Labs  Lab 02/04/23 0555  BNP 546.8*    DDimer No results for input(s): "DDIMER" in the last 168 hours.   Radiology/Studies:  DG Chest Port 1 View Result Date: 02/04/2023 CLINICAL DATA:  88 year old female with history of sepsis. EXAM: PORTABLE CHEST 1 VIEW COMPARISON:  Chest x-ray 01/02/2023. FINDINGS: Lung volumes are low. Bibasilar opacities (left-greater-than-right) which may reflect areas of atelectasis and/or consolidation, with superimposed small right and moderate left pleural effusions. No pneumothorax. There is cephalization of the pulmonary vasculature and slight indistinctness of the interstitial markings suggestive of mild pulmonary edema. Mild cardiomegaly. The patient is rotated to the left on today's exam, resulting in distortion of the mediastinal contours and reduced diagnostic sensitivity and specificity for mediastinal pathology. Atherosclerotic calcifications are noted in the thoracic aorta. IMPRESSION: 1. The appearance the chest is most suggestive of congestive heart failure, as above. 2. Extensive bibasilar areas of atelectasis and/or consolidation. 3. Aortic atherosclerosis. Electronically Signed   By: Trudie Reed M.D.   On: 02/04/2023 06:36     Assessment and Plan:   Acute on chronic HFpEF exacerbation Recent admission in December 2024.  Possibly under diuresed or question of Lasix response or not on normal regiment.  Clearly volume up today but with improvement in symptoms and swelling.  She is -2 L in the last 24 hours.  Suspect her baseline weight is closer 270 pounds based off of previous office notes.  Today she is 181. Continue with IV Lasix 40 mg twice daily.  Restart SGLT2 inhibitor at discharge. Suspect she needs greater diuretic dose at discharge.  PTA was on Lasix 20 mg daily  PAF Generally maintains NSR however yesterday night noted to be in atrial fibrillation with heart rates generally around 110, but can go up to 120s.  Asymptomatic. Continue reduced dose of Eliquis 2.5 mg twice daily for renal function and age.  Currently on Lopressor 25 mg twice daily, may consider increasing to 50 mg twice daily.  She has significant anemia with no obvious etiology.  Will continue DOAC for now but need to closely monitor this. Continue amiodarone 200 mg twice daily  CKD Improving with diuresis.  On admission 1.72.  Currently 1.55.    Anemia She has had a precipitous drop since December when it was normal.  Hemoglobin now 7.5 and downtrending.  No obvious signs of bleeding.  Defer to primary team about workup.  Risk Assessment/Risk Scores:   New York Heart Association (NYHA) Functional Class NYHA Class III  CHA2DS2-VASc Score = 6   This indicates a 9.7% annual risk of stroke. The patient's score is based upon: CHF History: 1 HTN History: 1 Diabetes History: 0 Stroke History: 0 Vascular Disease History: 1 (coronary atherosclerosis noted on prior CT  in 05/2021) Age Score: 2 Gender Score: 1    For questions or updates, please contact [facility] Please consult [email or web address] for contact info under    Signed, Abagail Kitchens, PA-C  02/06/2023 10:10 AM   Patient examined chart reviewed Wished her Happy Birthday no cardiac complaints She is pale. Admitted with mild volume overload now improved with diuresis. She has significant anemia with drop in Hb to 7.5 FOB pending Will likely need to transition to heparin if GI w/u needed or Hb drops further Consider transfusion with lasix to get Hb > 8.  Continue amiodarone and beta blocker for PAF with low burden  Charlton Haws MD [facility]

## 2023-02-06 NOTE — Progress Notes (Signed)
Mobility Specialist - Progress Note Nurse requested Mobility Specialist to perform oxygen saturation test with pt which includes removing pt from oxygen both at rest and while ambulating.  Below are the results from that testing.     Patient Saturations on Room Air at Rest = spO2 86%  Patient Saturations on Room Air while Ambulating = sp02 N/A% .  Rested and performed pursed lip breathing for 1 minute with sp02 at N/A%.  Patient Saturations on 2 Liters of oxygen while Ambulating = sp02 91%  At end of testing pt left in room on 2  Liters of oxygen.  Reported results to nurse.     02/06/23 1149  Mobility  Activity Ambulated with assistance in room  Level of Assistance Contact guard assist, steadying assist  Assistive Device Front wheel walker  Distance Ambulated (ft) 30 ft  Range of Motion/Exercises Active Assistive  Activity Response Tolerated fair  Mobility Referral Yes  Mobility visit 1 Mobility  Mobility Specialist Start Time (ACUTE ONLY) 1130  Mobility Specialist Stop Time (ACUTE ONLY) 1149  Mobility Specialist Time Calculation (min) (ACUTE ONLY) 19 min   (2L [place]) Pre-mobility: 116 bpm HR, 93% SpO2 During mobility: 127 bpm HR, 91% SpO2 Post-mobility: 111 bpm HR, 97% SPO2  Pt was found in wheelchair and agreeable to ambulate in room. Pt stated feeling unsteady during ambulation and stated being fearful of falling. NT in room and assisted with session. At EOS pt returned to recliner chair with all needs met. Call bell in reach and NT in room.  Billey Chang Mobility Specialist

## 2023-02-06 NOTE — Plan of Care (Signed)
  Problem: Fluid Volume: Goal: Ability to maintain a balanced intake and output will improve Outcome: Progressing   Problem: Metabolic: Goal: Ability to maintain appropriate glucose levels will improve Outcome: Progressing   Problem: Skin Integrity: Goal: Risk for impaired skin integrity will decrease Outcome: Progressing

## 2023-02-06 NOTE — Progress Notes (Signed)
[facility] Progress Note  Patient: Brenda Matthews     JYN:[id number]  DOA: 02/04/2023   PCP: Karna Dupes, MD       Brief hospital course: This is an 88 year old female with atrial fibrillation, diastolic heart failure who presents to the hospital with shortness of breath, orthopnea and pedal edema up to her thighs from her nursing facility.  She presented on a CPAP and was transition to a BiPAP.  Eventually was transitioned down to 4 L of oxygen. Influenza RSV and COVID-negative  Subjective:  Dyspnea has improved. Noted to be in A-fib on tele but she does not feel any palpitations.   Assessment and Plan: Principal Problem:   Acute diastolic (congestive) heart failure (HCC) -Last echo was 01/06/2023 and revealed a normal EF with grade 1 diastolic dysfunction and left and right atrial dilatation - Continue IV Lasix - Jardiance on hold - not as short of breath- still having tachypnea and still has extensive pedal edema and crackles on exam  Active Problems: Paroxysmal atrial fibrillation and flutter (HCC) - outpt meds>  amiodarone, metoprolol and Eliquis - upon reviewing tele, it appears she went into RVR yesterday afternoon - have requested a cardiology consult to assist with management   CKD stage IIIb - baseline creatinine has been between 1.1- 1.8 as outpt  Normocytic anemia -Anemia profile reviewed no deficiencies noted - hemoccult negative - no signs of bleeding in the hospital    Code Status: Full Code Total time on patient care: 35 min DVT prophylaxis:  Eliquis   Objective:   Vitals:   02/05/23 2047 02/05/23 2316 02/06/23 0206 02/06/23 0559  BP: 137/88  121/60 119/78  Pulse: (!) 108  (!) 106   Resp: 16  20 20   Temp: 97.7 F (36.5 C)   98.3 F (36.8 C)  TempSrc: Oral     SpO2: 96% 92% 94% 97%  Weight:      Height:       Filed Weights   02/04/23 1400 02/05/23 0518  Weight: 82.1 kg 82.3 kg   Exam: General exam: Appears comfortable  HEENT:  oral mucosa moist Respiratory system: bibasilar crackles persist Cardiovascular system: S1 & S2 heard - IIRR Gastrointestinal system: Abdomen soft, non-tender, nondistended. Normal bowel sounds   Extremities: No cyanosis, clubbing - still has pedal edema up to thighs but not as severe as yesterday Psychiatry:  Mood & affect appropriate.       CBC: Recent Labs  Lab 02/04/23 0550 02/05/23 0530  WBC 10.4 9.2  NEUTROABS 7.9*  --   HGB 7.6* 7.5*  HCT 25.9* 26.5*  MCV 98.5 99.3  PLT 173 167   Basic Metabolic Panel: Recent Labs  Lab 02/04/23 0550 02/05/23 0530  NA 142 141  K 3.7 4.0  CL 107 107  CO2 28 29  GLUCOSE 106* 97  BUN 31* 29*  CREATININE 1.72* 1.55*  CALCIUM 9.1 8.9     Scheduled Meds:  amiodarone  200 mg Oral Daily   apixaban  2.5 mg Oral BID   atorvastatin  40 mg Oral q1800   calcium carbonate  1 tablet Oral Q breakfast   fluticasone  2 spray Each Nare Daily   furosemide  40 mg Intravenous BID   gabapentin  100 mg Oral TID   insulin aspart  0-15 Units Subcutaneous TID WC   insulin aspart  0-5 Units Subcutaneous QHS   metoprolol tartrate  25 mg Oral BID   potassium chloride  30 mEq Oral  Daily    Imaging and lab data personally reviewed   Author: Calvert Cantor  02/06/2023 11:23 AM  To contact [facility]>   Check the care team in [facility] and look for the attending/consulting [facility] provider listed  Log into [email or web address] and use [place]'s universal password   Go to> "[facility]"  and find provider  If you still have difficulty reaching the provider, please page the [facility] (Director on Call) for the Hospitalists listed on amion

## 2023-02-07 ENCOUNTER — Inpatient Hospital Stay (HOSPITAL_COMMUNITY): Payer: Medicare PPO

## 2023-02-07 DIAGNOSIS — I5031 Acute diastolic (congestive) heart failure: Secondary | ICD-10-CM | POA: Diagnosis not present

## 2023-02-07 DIAGNOSIS — I480 Paroxysmal atrial fibrillation: Secondary | ICD-10-CM

## 2023-02-07 LAB — BASIC METABOLIC PANEL
Anion gap: 9 (ref 5–15)
BUN: 32 mg/dL — ABNORMAL HIGH (ref 8–23)
CO2: 29 mmol/L (ref 22–32)
Calcium: 8.5 mg/dL — ABNORMAL LOW (ref 8.9–10.3)
Chloride: 106 mmol/L (ref 98–111)
Creatinine, Ser: 1.39 mg/dL — ABNORMAL HIGH (ref 0.44–1.00)
GFR, Estimated: 37 mL/min — ABNORMAL LOW (ref >60)
Glucose, Bld: 93 mg/dL (ref 70–99)
Potassium: 3.6 mmol/L (ref 3.5–5.1)
Sodium: 144 mmol/L (ref 135–145)

## 2023-02-07 LAB — CBC
HCT: 25.3 % — ABNORMAL LOW (ref 36.0–46.0)
Hemoglobin: 7.6 g/dL — ABNORMAL LOW (ref 12.0–15.0)
MCH: 28.7 pg (ref 26.0–34.0)
MCHC: 30 g/dL (ref 30.0–36.0)
MCV: 95.5 fL (ref 80.0–100.0)
Platelets: 197 10*3/uL (ref 150–400)
RBC: 2.65 MIL/uL — ABNORMAL LOW (ref 3.87–5.11)
RDW: 16.5 % — ABNORMAL HIGH (ref 11.5–15.5)
WBC: 8.3 10*3/uL (ref 4.0–10.5)
nRBC: 0 % (ref 0.0–0.2)

## 2023-02-07 LAB — GLUCOSE, CAPILLARY
Glucose-Capillary: 80 mg/dL (ref 70–99)
Glucose-Capillary: 90 mg/dL (ref 70–99)
Glucose-Capillary: 94 mg/dL (ref 70–99)
Glucose-Capillary: 133 mg/dL — ABNORMAL HIGH (ref 70–99)
Glucose-Capillary: 75 mg/dL (ref 70–99)
Glucose-Capillary: 80 mg/dL (ref 70–99)

## 2023-02-07 MED ORDER — OLANZAPINE 5 MG PO TBDP: 5.0000 mg | ORAL_TABLET | Freq: Every day | ORAL | Status: DC

## 2023-02-07 MED ORDER — METOPROLOL TARTRATE 50 MG PO TABS
50.0000 mg | ORAL_TABLET | Freq: Two times a day (BID) | ORAL | Status: DC
  Administered 2023-02-07: 50 mg via ORAL

## 2023-02-07 MED ORDER — AMIODARONE HCL IN DEXTROSE 360-4.14 MG/200ML-% IV SOLN
60.0000 mg/h | INTRAVENOUS | Status: AC
  Administered 2023-02-07: 60 mg/h via INTRAVENOUS
  Filled 2023-02-07: qty 200

## 2023-02-07 MED ORDER — ENOXAPARIN SODIUM 80 MG/0.8ML IJ SOSY
80.0000 mg | PREFILLED_SYRINGE | Freq: Every day | INTRAMUSCULAR | Status: DC
  Administered 2023-02-07 – 2023-02-08 (×2): 80 mg via SUBCUTANEOUS
  Filled 2023-02-07 (×2): qty 0.8

## 2023-02-07 MED ORDER — AMIODARONE HCL IN DEXTROSE 360-4.14 MG/200ML-% IV SOLN
30.0000 mg/h | INTRAVENOUS | Status: DC
Start: 2023-02-07 — End: 2023-02-08
  Administered 2023-02-07 – 2023-02-08 (×2): 30 mg/h via INTRAVENOUS
  Filled 2023-02-07 (×2): qty 200

## 2023-02-07 MED ORDER — QUETIAPINE FUMARATE 25 MG PO TABS: 25.0000 mg | ORAL_TABLET | Freq: Every day | ORAL | Status: DC

## 2023-02-07 MED ORDER — ALBUTEROL SULFATE (2.5 MG/3ML) 0.083% IN NEBU
INHALATION_SOLUTION | RESPIRATORY_TRACT | Status: AC
  Administered 2023-02-07: 2.5 mg via RESPIRATORY_TRACT
  Filled 2023-02-07: qty 3

## 2023-02-07 MED ORDER — ALBUTEROL SULFATE (2.5 MG/3ML) 0.083% IN NEBU
2.5000 mg | INHALATION_SOLUTION | RESPIRATORY_TRACT | Status: DC | PRN
  Administered 2023-02-07 – 2023-02-12 (×8): 2.5 mg via RESPIRATORY_TRACT
  Filled 2023-02-07 (×8): qty 3

## 2023-02-07 NOTE — Progress Notes (Signed)
Patient went into Afib with heart rates 110s to 120s. Ulla Gallo, NP notified. Patient is asymptomatic denies chest pain, dizziness, or lightheadedness. EKG completed. See orders.

## 2023-02-07 NOTE — Progress Notes (Signed)
PHARMACY - ANTICOAGULATION CONSULT NOTE  Pharmacy Consult for Lovenox Indication: atrial fibrillation  Allergies  Allergen Reactions   Augmentin [Amoxicillin-Pot Clavulanate] Nausea And Vomiting and Other (See Comments)    "projectile vomiting" Has patient had a PCN reaction causing immediate rash, facial/tongue/throat swelling, SOB or lightheadedness with hypotension:No Has patient had a PCN reaction causing severe rash involving mucus membranes or skin necrosis:No Has patient had a PCN reaction that required hospitalization:No Has patient had a PCN reaction occurring within the last 10 years:Yes If all of the above answers are "NO", then may proceed with Cephalosporin use.    Amoxicillin Rash   Clindamycin/Lincomycin Rash    Patient Measurements: Height: 5\' 1"  (154.9 cm) Weight: 80.3 kg (177 lb 0.5 oz) IBW/kg (Calculated) : 47.8  Vital Signs: Temp: 97.4 F (36.3 C) (01/21 0539) Temp Source: Oral (01/21 0539) BP: 144/73 (01/21 0548) Pulse Rate: 98 (01/21 0548)  Labs: Recent Labs    02/05/23 0530 02/07/23 0457  HGB 7.5* 7.6*  HCT 26.5* 25.3*  PLT 167 197  CREATININE 1.55* 1.39*    Estimated Creatinine Clearance: 27.9 mL/min (A) (by C-G formula based on SCr of 1.39 mg/dL (H)).   Medical History: Past Medical History:  Diagnosis Date   Abnormal EKG approx 2008   Nuclear stress test neg;    Acute pancreatitis 09/2020   idiopathic.  +panc psudocyst   Anxiety    with panic   CAP (community acquired pneumonia) 08/2017   Hospitalization for CAP/acute diast HF/rapid a-fib   Cataract    s/p surgery--lens implants   Chronic diastolic heart failure (HCC) 2019   Chronic renal insufficiency, stage 3 (moderate) (HCC) 12/04/2012   Renal u/s when in hosp 08/2017 for CAP/CHF showed symmetric kidneys, echogenicity normal, w/out hydronephrosis. Baseline GFR around 40 ml/min as of 10/2018.   DDD (degenerative disc disease), lumbar    Diverticulosis 2009   Fracture of radial  shaft, left, closed 11/16/2010   fell down flight of stairs   History of kidney stones    Hx of adenomatous colonic polyps 2002;2009;2015   surveillance colonoscopy 2009, +polypectomy done-tubular adenoma w/out high grade.  05/2013 tubular adenomas--recall 3 yrs   Hyperlipidemia    Hypertension    Low TSH level 02/18/2016   T3 norm, T4 mildly elevated--suspected sick euthyroid syndrome.  Repeat labs 06/2016: normal.   Melanoma in situ (HCC) 06/2018   L LL   Osteoarthritis of both knees    viscosupplementation injections helpful 2020/21   Osteopenia    DEXA 08/2010; repeat DEXA 02/2015 worse: fosamax started.  06/2018 Dexa T score -2.4.  2020 maj osteop fx risk = 24%, Hip fx risk 7.3%. 09/2020 T score -2.1   PAF (paroxysmal atrial fibrillation) (HCC) 02/2016   2018.   DCCV x 2 2024.   Peripheral edema    Pneumonia 2015   hx with sepsis   Rheumatic fever     Assessment: AC/Heme: atrial fibrillation on Eliquis 2.5 BID.  Resumed CBC: Hgb low at 7.6 low but stable.  Plt WNL No s/s bleeding reported 1/19 FOB neg - 1/21: Pt refusing Eliquis. Change to Lovenox  Goal of Therapy:  Anti-Xa level 0.6-1 units/ml 4hrs after LMWH dose given Monitor platelets by anticoagulation protocol: Yes   Plan:  D/c Eliquis-pt refusing Lovenox 80mg  SQ q24 for CrCl<30   Brenda Matthews, PharmD, BCPS Clinical Staff Pharmacist [email or web address] Merilynn Matthews, [employer] 02/07/2023,10:42 AM

## 2023-02-07 NOTE — Progress Notes (Signed)
Patient Name: Brenda Matthews Date of Encounter: 02/07/2023 [facility] Cardiologist: Chrystie Nose, MD   Interval Summary  .    Seems to be much more confused.  Very lethargic and hypoxic today with oxygen saturations in the mid 80s.  She is responsive but not oriented at all.  Vital Signs .    Vitals:   02/06/23 1717 02/06/23 1941 02/07/23 0539 02/07/23 0548  BP: (!) 156/82 124/80 (!) 247/222 (!) 144/73  Pulse: 99 97 (!) 115 98  Resp: (!) 22 19 (!) 22   Temp: 97.9 F (36.6 C) 98.2 F (36.8 C) (!) 97.4 F (36.3 C)   TempSrc: Oral Oral Oral   SpO2: 100% 94% 98% 98%  Weight:      Height:        Intake/Output Summary (Last 24 hours) at 02/07/2023 0940 Last data filed at 02/06/2023 1845 Gross per 24 hour  Intake 300 ml  Output 600 ml  Net -300 ml      02/05/2023    5:18 AM 02/04/2023    2:00 PM 01/14/2023    5:01 AM  Last 3 Weights  Weight (lbs) 181 lb 7 oz 181 lb 172 lb 6.4 oz  Weight (kg) 82.3 kg 82.101 kg 78.2 kg      Telemetry/ECG    Atrial fibrillation heart rate 110s to 120s- Personally Reviewed  CV Studies    Echocardiogram 01/03/2023 1. Left ventricular ejection fraction, by estimation, is 60 to 65%. The  left ventricle has normal function. The left ventricle has no regional  wall motion abnormalities. There is mild concentric left ventricular  hypertrophy. Left ventricular diastolic  parameters are consistent with Grade I diastolic dysfunction (impaired  relaxation).   2. Right ventricular systolic function is normal. The right ventricular  size is normal. There is normal pulmonary artery systolic pressure.   3. Left atrial size was moderately dilated.   4. Right atrial size was moderately dilated.   5. The mitral valve is normal in structure. Trivial mitral valve  regurgitation. No evidence of mitral stenosis.   6. The aortic valve is tricuspid. There is mild calcification of the  aortic valve. Aortic valve regurgitation is mild. No aortic  stenosis is  present.   7. Aortic dilatation noted. There is mild dilatation of the ascending  aorta, measuring 40 mm.   8. The inferior vena cava is normal in size with <50% respiratory  variability, suggesting right atrial pressure of 8 mmHg.    Physical Exam .   GEN: No acute distress.   Neck: + JVD Cardiac: RRR, no murmurs, rubs, or gallops.  Respiratory: +crackles GI: Soft, nontender, non-distended  MS: 1+ edema, much improved  Patient Profile    Brenda Matthews is a 88 y.o. female has hx of chronic HFpEF, PAF, hypertension, hyperlipidemia, CKD.  Admitted for CHF exacerbation and rate control.  Assessment & Plan .     Acute on chronic HFpEF exacerbation Recent admission in December 2024.  Possibly under diuresed or question of Lasix response or not on normal regiment.  Suspect her baseline body weight is closer to 270 pounds based off of previous office notes.  On physical exam appears to be diuresing very well however I's and O's are not documented accurately.  Have asked nurse to help with this.  She seems disproportionately hypoxic and confused.  Wonder if there is something else going on here.  Defer further management per primary team.  Continue with IV Lasix  40 mg twice daily.  Restart SGLT2 inhibitor at discharge. Suspect she needs greater diuretic dose at discharge.  PTA was on Lasix 20 mg daily   PAF Generally maintains NSR however yesterday night noted to be in atrial fibrillation with heart rates generally around 110, but can go up to 120s. Asymptomatic.  Continue reduced dose of Eliquis 2.5 mg twice daily for renal function and age.  On Lopressor 25 mg twice daily, increasing to 50 mg twice daily.  She has significant anemia with no obvious etiology.  Will continue DOAC for now but need to closely monitor this. Continue amiodarone 200 mg daily   CKD Improving with diuresis.  On admission 1.72.  Currently 1.39.     Anemia She has had a precipitous drop since  December when it was normal.  Hemoglobin now 7.6 and downtrending, but stable today.  No obvious signs of bleeding.  Defer to primary team about workup.  For questions or updates, please contact [facility] Please consult [email or web address] for contact info under        Signed, Abagail Kitchens, PA-C

## 2023-02-07 NOTE — Progress Notes (Signed)
The patient became increasingly confused throughout the night, with episodes of agitation and pulling on tubes and drains. Continued monitoring and support are offered, including attempts to reorient the patient as necessary. Will continue to assess and assure patients' safety.

## 2023-02-07 NOTE — TOC Progression Note (Addendum)
Transition of Care [facility]) - Progression Note    Patient Details  Name: Brenda Matthews MRN: [id number] Date of Birth: 03-Oct-1935  Transition of Care [facility]) CM/SW Contact  Chitara Clonch, Olegario Messier, RN Phone Number: 02/07/2023, 10:16 AM  Clinical Narrative: Left vm w/[facility] director to inform of recc ST SNF. Per prior note Patient from [facility].Noted s/p rapid response yesterday. Continue to monitor.   -2p-confirmed from [facility] await to hear back from Dave(son) agreeable to return. Spoke to Dru dir @ [facility]-d/c plan for return.   Expected Discharge Plan: Skilled Nursing Facility Barriers to Discharge: Continued Medical Work up  Expected Discharge Plan and Services In-house Referral: Clinical Social Work     Living arrangements for the past 2 months: Independent Living Facility                                       Social Determinants of Health (SDOH) Interventions SDOH Screenings   Food Insecurity: No Food Insecurity (02/04/2023)  Housing: Low Risk  (02/04/2023)  Transportation Needs: No Transportation Needs (02/04/2023)  Utilities: Not At Risk (02/04/2023)  Alcohol Screen: Low Risk  (10/11/2020)  Depression (PHQ2-9): Low Risk  (09/01/2022)  Financial Resource Strain: Low Risk  (10/27/2021)  Physical Activity: Sufficiently Active (10/27/2021)  Social Connections: Moderately Integrated (02/04/2023)  Stress: No Stress Concern Present (10/27/2021)  Tobacco Use: Low Risk  (02/04/2023)    Readmission Risk Interventions    02/06/2023    2:09 PM 10/24/2022    9:08 AM  Readmission Risk Prevention Plan  Post Dischage Appt  Complete  Medication Screening  Complete  Transportation Screening Complete Complete  HRI or Home Care Consult Complete   Social Work Consult for Recovery Care Planning/Counseling Complete   Palliative Care Screening Not Applicable   Medication Review [employer]) Complete

## 2023-02-07 NOTE — Progress Notes (Addendum)
[facility] Progress Note  Patient: Brenda Matthews     WUJ:[id number]  DOA: 02/04/2023   PCP: Karna Dupes, MD       Brief hospital course: This is an 88 year old female with atrial fibrillation, diastolic heart failure who presents to the hospital with shortness of breath, orthopnea and pedal edema up to her thighs from her nursing facility.  She presented on a CPAP and was transition to a BiPAP.  Eventually was transitioned down to 4 L of oxygen. Influenza RSV and COVID-negative On my initial exam, she had visible tachypnea at rest, crackles in b/l lungs and edema up to thighs.   Subjective:  Has been agitated overnight- very somnolent on my exam.   Assessment and Plan: Principal Problem:   HFpEF -Last echo was 01/06/2023 and revealed a normal EF with grade 1 diastolic dysfunction and left and right atrial dilatation - Continue IV Lasix  - Jardiance on hold - Weaned her to 2 L on 1/20- - room air pulse ox in 80s- still has tachypnea - pedal edema resolved-   Active Problems: Paroxysmal atrial fibrillation and flutter (HCC) - outpt meds>  amiodarone, metoprolol and Eliquis - upon reviewing tele, it appears she went into RVR  on 1/20 - have requested a cardiology consult to assist with management-rate controlling meds per cardiology - Will switch Eliquis to Lovenox until she can take oral meds again  Delirium - severe last night - was very oriented when she first arrived and was able to tell me her medical history in detail- mild confusion subsequently started but she was able to function and able to ambulate in the hall - steadily has become more confused- apparently agitated overnight and did not sleep much last night per RN-  - probably hospital acquired delirium - too sleepy to swallow oral meds today-  avoid sedatives  - dc Trazodone which was ordered on admission as it is not helping (may be causing the confusion?) - have ordered sitter   CKD stage IIIb -  baseline creatinine has been between 1.1- 1.8 as outpt - following  Normocytic anemia - 8.7 when admitted- dropped to 7.6  -Anemia profile reviewed no deficiencies noted - hemoccult negative - no signs of bleeding in the hospital  Lives in [place] and seemed to be quite independent prior to admission.    Code Status: Full Code Total time on patient care: 35 min DVT prophylaxis:  Eliquis   Objective:   Vitals:   02/07/23 0539 02/07/23 0548 02/07/23 0900 02/07/23 1119  BP: (!) 247/222 (!) 144/73  (!) 152/77  Pulse: (!) 115 98  100  Resp: (!) 22     Temp: (!) 97.4 F (36.3 C)   98.2 F (36.8 C)  TempSrc: Oral   Oral  SpO2: 98% 98%  99%  Weight:   80.3 kg   Height:       Filed Weights   02/04/23 1400 02/05/23 0518 02/07/23 0900  Weight: 82.1 kg 82.3 kg 80.3 kg   Exam: General exam: Appears comfortable - very somnolent and asleep in bed- not following commands- hands in mitts Respiratory system: poor breath sounds but over all cleared when listened to anteriorly Cardiovascular system: S1 & S2 heard - IIRR Gastrointestinal system: Abdomen soft, non-tender, nondistended. Normal bowel sounds   Extremities: No cyanosis, clubbing - pedal edema completely resolved   CBC: Recent Labs  Lab 02/04/23 0550 02/05/23 0530 02/07/23 0457  WBC 10.4 9.2 8.3  NEUTROABS 7.9*  --   --  HGB 7.6* 7.5* 7.6*  HCT 25.9* 26.5* 25.3*  MCV 98.5 99.3 95.5  PLT 173 167 197   Basic Metabolic Panel: Recent Labs  Lab 02/04/23 0550 02/05/23 0530 02/07/23 0457  NA 142 141 144  K 3.7 4.0 3.6  CL 107 107 106  CO2 28 29 29   GLUCOSE 106* 97 93  BUN 31* 29* 32*  CREATININE 1.72* 1.55* 1.39*  CALCIUM 9.1 8.9 8.5*     Scheduled Meds:  amiodarone  200 mg Oral Daily   atorvastatin  40 mg Oral q1800   calcium carbonate  1 tablet Oral Q breakfast   enoxaparin (LOVENOX) injection  80 mg Subcutaneous Daily   fluticasone  2 spray Each Nare Daily   furosemide  40 mg Intravenous BID    gabapentin  100 mg Oral TID   insulin aspart  0-15 Units Subcutaneous TID WC   insulin aspart  0-5 Units Subcutaneous QHS   metoprolol tartrate  50 mg Oral BID   potassium chloride  30 mEq Oral Daily   QUEtiapine  25 mg Oral QHS    Imaging and lab data personally reviewed   Author: Calvert Cantor  02/07/2023 11:25 AM  To contact [facility]>   Check the care team in [facility] and look for the attending/consulting [facility] provider listed  Log into [email or web address] and use [place]'s universal password   Go to> "[facility]"  and find provider  If you still have difficulty reaching the provider, please page the [facility] (Director on Call) for the Hospitalists listed on amion

## 2023-02-07 NOTE — Progress Notes (Signed)
Patient too confused to take PO at this time. Will DC lopressor and change to IV amio infusion only. Primary switched her to lovonox. Will order CT head given AMS change.

## 2023-02-07 NOTE — NC FL2 (Deleted)
[place] MEDICAID FL2 LEVEL OF CARE FORM     IDENTIFICATION  Patient Name: Brenda Matthews Birthdate: [birth year] Sex: female Admission Date (Current Location): 02/04/2023  [facility] and [place] Number:  Producer, television/film/video and Address:  [facility],  [address] [place], [place] [address]      Provider Number: [id number]  Attending Physician Name and Address:  Calvert Cantor, MD  Relative Name and Phone Number:  Eloisa Leete [phone]    Current Level of Care: Hospital Recommended Level of Care: Skilled Nursing Facility Prior Approval Number:    Date Approved/Denied:   PASRR Number: [id number]  Discharge Plan: SNF    Current Diagnoses: Patient Active Problem List   Diagnosis Date Noted   Acute diastolic (congestive) heart failure (HCC) 02/04/2023   Depression 01/11/2023   Cellulitis of right leg 01/09/2023   Hypokalemia 01/09/2023   Constipation 01/09/2023   Acute pulmonary edema (HCC) 01/03/2023   Acute respiratory failure with hypoxemia (HCC) 01/02/2023   High risk medication use 11/07/2022   Acute bilateral low back pain without sciatica 11/07/2022   ARF (acute renal failure) (HCC) 10/23/2022   Acute renal failure superimposed on stage 3b chronic kidney disease (HCC) 10/22/2022   Chronic heart failure with preserved ejection fraction (HFpEF) (HCC) 10/22/2022   Elevated transaminase level 10/22/2022   Physical deconditioning 06/14/2022   Acute on chronic diastolic (congestive) heart failure (HCC) 06/11/2022   Idiopathic acute pancreatitis without necrosis or infection 09/14/2020   Pancreatic cyst 09/14/2020   Pancreatic duct dilated 09/14/2020   Pure hypercholesterolemia 09/14/2020   Hypertensive heart and chronic kidney disease with heart failure and stage 1 through stage 4 chronic kidney disease, or unspecified chronic kidney disease (HCC) 09/28/2018   Spinal stenosis, lumbar region with neurogenic claudication 09/26/2018   Osteoarthritis of right  knee 11/21/2017   Acute on chronic diastolic CHF (congestive heart failure) (HCC)    S/P thoracentesis    Acute respiratory failure (HCC)    Rash in adult 08/30/2017   Atrial fibrillation and flutter (HCC)    DOE (dyspnea on exertion) 08/29/2017   Arterial hypotension    Chronic anticoagulation 12/19/2016   AKI (acute kidney injury) (HCC)    Low TSH level 02/19/2016   PAF (paroxysmal atrial fibrillation) (HCC) 02/18/2016   Ankle fracture, left-s/p surgery 02/18/16 02/18/2016   Closed displaced trimalleolar fracture of left ankle 02/18/2016   Acute bronchitis 02/10/2015   Bronchopneumonia 12/02/2013   Chronic kidney disease, stage 3b (HCC) 12/04/2012   Hx of adenomatous colonic polyps    Routine general medical examination at a health care facility 09/05/2011   Cervical cancer screening 02/24/2011   SCIATICA, BILATERAL 09/01/2009   Sprain of sacroiliac region 09/01/2009   ANEMIA 11/07/2007   Osteopenia of the elderly 11/07/2007   INSOMNIA 11/07/2007   EDEMA 08/14/2007   Hyperlipidemia 04/04/2007   PANIC DISORDER 04/04/2007   Essential hypertension 04/04/2007   Anxiety state 12/27/2006   Arthropathy 12/27/2006    Orientation RESPIRATION BLADDER Height & Weight     Self, Time, Situation, Place  O2   Weight: 80.3 kg Height:  5\' 1"  (154.9 cm)  BEHAVIORAL SYMPTOMS/MOOD NEUROLOGICAL BOWEL NUTRITION STATUS      Continent Diet (Heart Healthy)  AMBULATORY STATUS COMMUNICATION OF NEEDS Skin   Limited Assist Verbally Normal                       Personal Care Assistance Level of Assistance  Bathing, Feeding, Dressing Bathing Assistance:  Limited assistance Feeding assistance: Limited assistance       Functional Limitations Info  Sight, Hearing, Speech Sight Info: Adequate   Speech Info: Adequate    SPECIAL CARE FACTORS FREQUENCY  PT (By licensed PT), OT (By licensed OT)     PT Frequency: 5x week OT Frequency: 5x week            Contractures Contractures Info:  Not present    Additional Factors Info  Code Status, Allergies Code Status Info: Full Allergies Info: Augmentin (Amoxicillin-pot Clavulanate), Amoxicillin, Clindamycin/lincomycin           Current Medications (02/07/2023):  This is the current hospital active medication list Current Facility-Administered Medications  Medication Dose Route Frequency Provider Last Rate Last Admin   acetaminophen (TYLENOL) tablet 650 mg  650 mg Oral Q6H PRN Kirby Crigler, Mir M, MD       Or   acetaminophen (TYLENOL) suppository 650 mg  650 mg Rectal Q6H PRN Kirby Crigler, Mir M, MD       albuterol (PROVENTIL) (2.5 MG/3ML) 0.083% nebulizer solution 2.5 mg  2.5 mg Nebulization Q4H PRN Calvert Cantor, MD   2.5 mg at 02/07/23 0318   amiodarone (NEXTERONE PREMIX) 360-4.14 MG/200ML-% (1.8 mg/mL) IV infusion  60 mg/hr Intravenous Continuous Yvonna Alanis L, PA-C 33.3 mL/hr at 02/07/23 1334 60 mg/hr at 02/07/23 1334   amiodarone (NEXTERONE PREMIX) 360-4.14 MG/200ML-% (1.8 mg/mL) IV infusion  30 mg/hr Intravenous Continuous Yvonna Alanis L, PA-C       atorvastatin (LIPITOR) tablet 40 mg  40 mg Oral q1800 Kirby Crigler, Mir M, MD   40 mg at 02/06/23 1709   calcium carbonate (OS-CAL - dosed in mg of elemental calcium) tablet 1,250 mg  1 tablet Oral Q breakfast Kirby Crigler, Mir M, MD   1,250 mg at 02/06/23 9604   enoxaparin (LOVENOX) injection 80 mg  80 mg Subcutaneous Daily Norva Pavlov, RPH   80 mg at 02/07/23 1327   fluticasone (FLONASE) 50 MCG/ACT nasal spray 2 spray  2 spray Each Nare Daily Kirby Crigler, Mir M, MD   2 spray at 02/07/23 1000   furosemide (LASIX) injection 40 mg  40 mg Intravenous BID Kirby Crigler, Mir M, MD   40 mg at 02/07/23 5409   gabapentin (NEURONTIN) capsule 100 mg  100 mg Oral TID Kirby Crigler, Mir M, MD   100 mg at 02/07/23 0959   insulin aspart (novoLOG) injection 0-15 Units  0-15 Units Subcutaneous TID WC Kirby Crigler, Mir M, MD       insulin aspart (novoLOG) injection 0-5 Units  0-5 Units Subcutaneous  QHS Kirby Crigler, Mir M, MD       lip balm (CARMEX) ointment   Topical PRN Kirby Crigler, Mir M, MD       ondansetron [facility]) tablet 4 mg  4 mg Oral Q6H PRN Kirby Crigler, Mir M, MD       Or   ondansetron [facility]) injection 4 mg  4 mg Intravenous Q6H PRN Kirby Crigler, Mir M, MD       potassium chloride (KLOR-CON M) CR tablet 30 mEq  30 mEq Oral Daily Kirby Crigler, Mir M, MD   30 mEq at 02/07/23 8119     Discharge Medications: Please see discharge summary for a list of discharge medications.  Relevant Imaging Results:  Relevant Lab Results:   Additional Information SS#  Jerrold Haskell, Olegario Messier, RN

## 2023-02-07 NOTE — [place] FL2 (Signed)
[place] MEDICAID FL2 LEVEL OF CARE FORM     IDENTIFICATION  Patient Name: Brenda Matthews Birthdate: 26-May-1935 Sex: female Admission Date (Current Location): 02/04/2023  [facility] and [place] Number:  Producer, television/film/video and Address:  [facility],  [address] [place], [place] [address]      Provider Number: [id number]  Attending Physician Name and Address:  Calvert Cantor, MD  Relative Name and Phone Number:  Kensly Farin [phone]    Current Level of Care: Hospital Recommended Level of Care: Skilled Nursing Facility Prior Approval Number:    Date Approved/Denied:   PASRR Number: [id number]  Discharge Plan: SNF    Current Diagnoses: Patient Active Problem List   Diagnosis Date Noted   Acute diastolic (congestive) heart failure (HCC) 02/04/2023   Depression 01/11/2023   Cellulitis of right leg 01/09/2023   Hypokalemia 01/09/2023   Constipation 01/09/2023   Acute pulmonary edema (HCC) 01/03/2023   Acute respiratory failure with hypoxemia (HCC) 01/02/2023   High risk medication use 11/07/2022   Acute bilateral low back pain without sciatica 11/07/2022   ARF (acute renal failure) (HCC) 10/23/2022   Acute renal failure superimposed on stage 3b chronic kidney disease (HCC) 10/22/2022   Chronic heart failure with preserved ejection fraction (HFpEF) (HCC) 10/22/2022   Elevated transaminase level 10/22/2022   Physical deconditioning 06/14/2022   Acute on chronic diastolic (congestive) heart failure (HCC) 06/11/2022   Idiopathic acute pancreatitis without necrosis or infection 09/14/2020   Pancreatic cyst 09/14/2020   Pancreatic duct dilated 09/14/2020   Pure hypercholesterolemia 09/14/2020   Hypertensive heart and chronic kidney disease with heart failure and stage 1 through stage 4 chronic kidney disease, or unspecified chronic kidney disease (HCC) 09/28/2018   Spinal stenosis, lumbar region with neurogenic claudication 09/26/2018   Osteoarthritis of right  knee 11/21/2017   Acute on chronic diastolic CHF (congestive heart failure) (HCC)    S/P thoracentesis    Acute respiratory failure (HCC)    Rash in adult 08/30/2017   Atrial fibrillation and flutter (HCC)    DOE (dyspnea on exertion) 08/29/2017   Arterial hypotension    Chronic anticoagulation 12/19/2016   AKI (acute kidney injury) (HCC)    Low TSH level 02/19/2016   PAF (paroxysmal atrial fibrillation) (HCC) 02/18/2016   Ankle fracture, left-s/p surgery 02/18/16 02/18/2016   Closed displaced trimalleolar fracture of left ankle 02/18/2016   Acute bronchitis 02/10/2015   Bronchopneumonia 12/02/2013   Chronic kidney disease, stage 3b (HCC) 12/04/2012   Hx of adenomatous colonic polyps    Routine general medical examination at a health care facility 09/05/2011   Cervical cancer screening 02/24/2011   SCIATICA, BILATERAL 09/01/2009   Sprain of sacroiliac region 09/01/2009   ANEMIA 11/07/2007   Osteopenia of the elderly 11/07/2007   INSOMNIA 11/07/2007   EDEMA 08/14/2007   Hyperlipidemia 04/04/2007   PANIC DISORDER 04/04/2007   Essential hypertension 04/04/2007   Anxiety state 12/27/2006   Arthropathy 12/27/2006    Orientation RESPIRATION BLADDER Height & Weight     Self, Time, Situation, Place  O2   Weight: 80.3 kg Height:  5\' 1"  (154.9 cm)  BEHAVIORAL SYMPTOMS/MOOD NEUROLOGICAL BOWEL NUTRITION STATUS      Continent Diet (Heart Healthy)  AMBULATORY STATUS COMMUNICATION OF NEEDS Skin   Limited Assist Verbally Normal                       Personal Care Assistance Level of Assistance  Bathing, Feeding, Dressing Bathing Assistance:  Limited assistance Feeding assistance: Limited assistance       Functional Limitations Info  Sight, Hearing, Speech Sight Info: Adequate   Speech Info: Adequate    SPECIAL CARE FACTORS FREQUENCY  PT (By licensed PT), OT (By licensed OT)     PT Frequency: 5x week OT Frequency: 5x week            Contractures Contractures Info:  Not present    Additional Factors Info  Code Status, Allergies Code Status Info: Full Allergies Info: Augmentin (Amoxicillin-pot Clavulanate), Amoxicillin, Clindamycin/lincomycin           Current Medications (02/07/2023):  This is the current hospital active medication list Current Facility-Administered Medications  Medication Dose Route Frequency Provider Last Rate Last Admin   acetaminophen (TYLENOL) tablet 650 mg  650 mg Oral Q6H PRN Kirby Crigler, Mir M, MD       Or   acetaminophen (TYLENOL) suppository 650 mg  650 mg Rectal Q6H PRN Kirby Crigler, Mir M, MD       albuterol (PROVENTIL) (2.5 MG/3ML) 0.083% nebulizer solution 2.5 mg  2.5 mg Nebulization Q4H PRN Calvert Cantor, MD   2.5 mg at 02/07/23 0318   amiodarone (NEXTERONE PREMIX) 360-4.14 MG/200ML-% (1.8 mg/mL) IV infusion  60 mg/hr Intravenous Continuous Yvonna Alanis L, PA-C 33.3 mL/hr at 02/07/23 1334 60 mg/hr at 02/07/23 1334   amiodarone (NEXTERONE PREMIX) 360-4.14 MG/200ML-% (1.8 mg/mL) IV infusion  30 mg/hr Intravenous Continuous Yvonna Alanis L, PA-C       atorvastatin (LIPITOR) tablet 40 mg  40 mg Oral q1800 Kirby Crigler, Mir M, MD   40 mg at 02/06/23 1709   calcium carbonate (OS-CAL - dosed in mg of elemental calcium) tablet 1,250 mg  1 tablet Oral Q breakfast Kirby Crigler, Mir M, MD   1,250 mg at 02/06/23 1610   enoxaparin (LOVENOX) injection 80 mg  80 mg Subcutaneous Daily Norva Pavlov, RPH   80 mg at 02/07/23 1327   fluticasone (FLONASE) 50 MCG/ACT nasal spray 2 spray  2 spray Each Nare Daily Kirby Crigler, Mir M, MD   2 spray at 02/07/23 1000   furosemide (LASIX) injection 40 mg  40 mg Intravenous BID Kirby Crigler, Mir M, MD   40 mg at 02/07/23 9604   gabapentin (NEURONTIN) capsule 100 mg  100 mg Oral TID Kirby Crigler, Mir M, MD   100 mg at 02/07/23 0959   insulin aspart (novoLOG) injection 0-15 Units  0-15 Units Subcutaneous TID WC Kirby Crigler, Mir M, MD       insulin aspart (novoLOG) injection 0-5 Units  0-5 Units Subcutaneous  QHS Kirby Crigler, Mir M, MD       lip balm (CARMEX) ointment   Topical PRN Kirby Crigler, Mir M, MD       ondansetron [facility]) tablet 4 mg  4 mg Oral Q6H PRN Kirby Crigler, Mir M, MD       Or   ondansetron [facility]) injection 4 mg  4 mg Intravenous Q6H PRN Kirby Crigler, Mir M, MD       potassium chloride (KLOR-CON M) CR tablet 30 mEq  30 mEq Oral Daily Kirby Crigler, Mir M, MD   30 mEq at 02/07/23 5409     Discharge Medications: Please see discharge summary for a list of discharge medications.  Relevant Imaging Results:  Relevant Lab Results:   Additional Information SS#[phone]  Analeigha Nauman, Olegario Messier, RN

## 2023-02-07 NOTE — Progress Notes (Signed)
   02/07/23 1800  Spiritual Encounters  Type of Visit Initial  Care provided to: Patient  Conversation partners present during encounter Nurse  Referral source Patient request;Chaplain assessment;Chaplain team  Reason for visit Urgent spiritual support  OnCall Visit No  Spiritual Framework  Presenting Themes Meaning/purpose/sources of inspiration;Values and beliefs;Significant life change;Impactful experiences and emotions;Courage hope and growth;Rituals and practive;Community and relationships  Patient Stress Factors Health changes;Loss;Loss of control;Major life changes  Family Stress Factors None identified   Met with patient at bedside - Latechia wanted to discuss lifes big questions:  Why is there suffering in the World?  Why is there wars and Holocaust?  Why is there Childhood cancer?  Chaplain provided spiritual care and comfort to her big questions. 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  Provided spiritual care and ended visit with a departing blessing.

## 2023-02-07 NOTE — Significant Event (Signed)
Rapid Response Event Note   Reason for Call :  Unresponsiveness to charge nurse with routine am rounding.  Initial Focused Assessment:  Patient responded to this nurse by saying "I'M AWAKE!" but showing signs of delirium - constant hand motions like she is bathing herself, pulling off nasal cannula, and was responsive but not oriented.  No other deficits found. Patient needs sleep.  Interventions:  CBG 90, BP 247/222 on left arm not felt to be accurate due to arms in motion. BP 144/73 on right leg accepted as baseline.  Plan of Care:  No change in plan of care.  Event Summary:   MD Notified: Chinita Greenland, NP Call Time: 0543 Arrival Time: 0545 End Time: 1478  Lamona Curl, RN

## 2023-02-08 DIAGNOSIS — I5031 Acute diastolic (congestive) heart failure: Secondary | ICD-10-CM | POA: Diagnosis not present

## 2023-02-08 DIAGNOSIS — I480 Paroxysmal atrial fibrillation: Secondary | ICD-10-CM | POA: Diagnosis not present

## 2023-02-08 LAB — CBC
HCT: 24.6 % — ABNORMAL LOW (ref 36.0–46.0)
Hemoglobin: 7.3 g/dL — ABNORMAL LOW (ref 12.0–15.0)
MCH: 28.3 pg (ref 26.0–34.0)
MCHC: 29.7 g/dL — ABNORMAL LOW (ref 30.0–36.0)
MCV: 95.3 fL (ref 80.0–100.0)
Platelets: 167 10*3/uL (ref 150–400)
RBC: 2.58 MIL/uL — ABNORMAL LOW (ref 3.87–5.11)
RDW: 16.6 % — ABNORMAL HIGH (ref 11.5–15.5)
WBC: 7.7 10*3/uL (ref 4.0–10.5)
nRBC: 0 % (ref 0.0–0.2)

## 2023-02-08 LAB — BASIC METABOLIC PANEL
Anion gap: 8 (ref 5–15)
BUN: 26 mg/dL — ABNORMAL HIGH (ref 8–23)
CO2: 35 mmol/L — ABNORMAL HIGH (ref 22–32)
Calcium: 8.8 mg/dL — ABNORMAL LOW (ref 8.9–10.3)
Chloride: 101 mmol/L (ref 98–111)
Creatinine, Ser: 1.16 mg/dL — ABNORMAL HIGH (ref 0.44–1.00)
GFR, Estimated: 46 mL/min — ABNORMAL LOW (ref >60)
Glucose, Bld: 86 mg/dL (ref 70–99)
Potassium: 3.6 mmol/L (ref 3.5–5.1)
Sodium: 144 mmol/L (ref 135–145)

## 2023-02-08 LAB — URINALYSIS, ROUTINE W REFLEX MICROSCOPIC
Bilirubin Urine: NEGATIVE
Glucose, UA: NEGATIVE mg/dL
Ketones, ur: NEGATIVE mg/dL
Nitrite: NEGATIVE
Protein, ur: NEGATIVE mg/dL
Specific Gravity, Urine: 1.01 (ref 1.005–1.030)
pH: 5 (ref 5.0–8.0)

## 2023-02-08 LAB — GLUCOSE, CAPILLARY
Glucose-Capillary: 108 mg/dL — ABNORMAL HIGH (ref 70–99)
Glucose-Capillary: 129 mg/dL — ABNORMAL HIGH (ref 70–99)
Glucose-Capillary: 151 mg/dL — ABNORMAL HIGH (ref 70–99)
Glucose-Capillary: 155 mg/dL — ABNORMAL HIGH (ref 70–99)
Glucose-Capillary: 83 mg/dL (ref 70–99)

## 2023-02-08 MED ORDER — POLYETHYLENE GLYCOL 3350 17 G PO PACK
17.0000 g | PACK | Freq: Two times a day (BID) | ORAL | Status: DC
  Administered 2023-02-08 – 2023-02-11 (×6): 17 g via ORAL
  Filled 2023-02-08 (×11): qty 1

## 2023-02-08 MED ORDER — AMIODARONE HCL 200 MG PO TABS
200.0000 mg | ORAL_TABLET | Freq: Every day | ORAL | Status: DC
  Administered 2023-02-08 – 2023-02-13 (×6): 200 mg via ORAL
  Filled 2023-02-08 (×6): qty 1

## 2023-02-08 MED ORDER — METOPROLOL TARTRATE 50 MG PO TABS
50.0000 mg | ORAL_TABLET | Freq: Two times a day (BID) | ORAL | Status: DC
  Administered 2023-02-08 – 2023-02-15 (×14): 50 mg via ORAL
  Filled 2023-02-08 (×15): qty 1

## 2023-02-08 MED ORDER — APIXABAN 2.5 MG PO TABS
2.5000 mg | ORAL_TABLET | Freq: Two times a day (BID) | ORAL | Status: DC
  Administered 2023-02-09 – 2023-02-15 (×14): 2.5 mg via ORAL
  Filled 2023-02-08 (×13): qty 1

## 2023-02-08 NOTE — Progress Notes (Signed)
PHARMACY - ANTICOAGULATION CONSULT NOTE  Pharmacy Consult for Lovenox> apixaban Indication: atrial fibrillation  Allergies  Allergen Reactions   Augmentin [Amoxicillin-Pot Clavulanate] Nausea And Vomiting and Other (See Comments)    "projectile vomiting" Has patient had a PCN reaction causing immediate rash, facial/tongue/throat swelling, SOB or lightheadedness with hypotension:No Has patient had a PCN reaction causing severe rash involving mucus membranes or skin necrosis:No Has patient had a PCN reaction that required hospitalization:No Has patient had a PCN reaction occurring within the last 10 years:Yes If all of the above answers are "NO", then may proceed with Cephalosporin use.    Amoxicillin Rash   Clindamycin/Lincomycin Rash    Patient Measurements: Height: 5\' 1"  (154.9 cm) Weight: 77.2 kg (170 lb 3.2 oz) IBW/kg (Calculated) : 47.8  Vital Signs: Temp: 98.6 F (37 C) (01/22 0747) Temp Source: Oral (01/22 0747) BP: 145/84 (01/22 0747) Pulse Rate: 126 (01/22 0747)  Labs: Recent Labs    02/07/23 0457 02/08/23 0453  HGB 7.6* 7.3*  HCT 25.3* 24.6*  PLT 197 167  CREATININE 1.39* 1.16*    Estimated Creatinine Clearance: 32.8 mL/min (A) (by C-G formula based on SCr of 1.16 mg/dL (H)).  AC/Heme: atrial fibrillation on Eliquis 2.5 BID.  continued on admit 1/18 1/19 FOB neg - 1/21: Pt refusing Eliquis. Changed to Lovenox 80 mg sq q24 with CrCl < 30 1/22 pharmacy consulted to resume home dose of Eliquis.  Lovenox 80 mg given today at 0831 am Hg 7.1  low but stable, PLT 167 - stable SCr down to 1.16 No bleeding reported  Goal of Therapy:  Anti-Xa level 0.6-1 units/ml 4hrs after LMWH dose given Monitor platelets by anticoagulation protocol: Yes   Plan:  D/c lovenox 80mg  SQ q24 Resume Eliquis 2.5 mg po bid on 1/23 in AM Pharmacy to sign off  Herby Abraham, Pharm.D Use secure chat for questions 02/08/2023 9:37 AM

## 2023-02-08 NOTE — Progress Notes (Addendum)
Patient Name: Brenda Matthews Date of Encounter: 02/08/2023 [facility] Cardiologist: Chrystie Nose, MD   Interval Summary  .    Mentation seems improved today however she has this whole body tremor.  Does not open her eyes fully but seems to respond to questions appropriately most of the time.  Denies any shortness of breath.  Vital Signs .    Vitals:   02/07/23 2341 02/08/23 0143 02/08/23 0328 02/08/23 0747  BP: 133/69  125/74 (!) 145/84  Pulse: (!) 115  (!) 129 (!) 126  Resp: 20  (!) 23 18  Temp: 98.9 F (37.2 C)  98.5 F (36.9 C) 98.6 F (37 C)  TempSrc: Oral  Oral Oral  SpO2: 99%  96% 97%  Weight:  77.2 kg    Height:        Intake/Output Summary (Last 24 hours) at 02/08/2023 1010 Last data filed at 02/08/2023 0536 Gross per 24 hour  Intake 1207.69 ml  Output 2000 ml  Net -792.31 ml      02/08/2023    1:43 AM 02/07/2023    9:00 AM 02/05/2023    5:18 AM  Last 3 Weights  Weight (lbs) 170 lb 3.2 oz 177 lb 0.5 oz 181 lb 7 oz  Weight (kg) 77.202 kg 80.3 kg 82.3 kg      Telemetry/ECG    Atrial fibrillation heart rate 110s to 120s- Personally Reviewed  CV Studies    Echocardiogram 01/03/2023 1. Left ventricular ejection fraction, by estimation, is 60 to 65%. The  left ventricle has normal function. The left ventricle has no regional  wall motion abnormalities. There is mild concentric left ventricular  hypertrophy. Left ventricular diastolic  parameters are consistent with Grade I diastolic dysfunction (impaired  relaxation).   2. Right ventricular systolic function is normal. The right ventricular  size is normal. There is normal pulmonary artery systolic pressure.   3. Left atrial size was moderately dilated.   4. Right atrial size was moderately dilated.   5. The mitral valve is normal in structure. Trivial mitral valve  regurgitation. No evidence of mitral stenosis.   6. The aortic valve is tricuspid. There is mild calcification of the  aortic  valve. Aortic valve regurgitation is mild. No aortic stenosis is  present.   7. Aortic dilatation noted. There is mild dilatation of the ascending  aorta, measuring 40 mm.   8. The inferior vena cava is normal in size with <50% respiratory  variability, suggesting right atrial pressure of 8 mmHg.    Physical Exam .   GEN: No acute distress.   Neck: mild JVD Cardiac: RRR, no murmurs, rubs, or gallops.  Respiratory: Lcrackles GI: Soft, nontender, non-distended  MS: mild edema, much improved  Patient Profile    Brenda Matthews is a 88 y.o. female has hx of chronic HFpEF, PAF, hypertension, hyperlipidemia, CKD.  Admitted for CHF exacerbation and rate control.  Assessment & Plan .     Acute on chronic HFpEF exacerbation Recent admission in December 2024.  Possibly under diuresed or question of Lasix response or not on normal regiment.  Suspect her baseline body weight is closer to 270 pounds based off of previous office notes.  She is diuresing very well with -2.3 L in the last 24 hours.  Mildly hypervolemic.  Goal for today will be to return back on room air, titrate off oxygen.  Suspect that we could probably transition to p.o. diuretics tomorrow.  Also appears to  be at her baseline weight today.  Continue with IV Lasix 40 mg twice daily.  Restart SGLT2 inhibitor at discharge. Suspect she needs greater diuretic dose at discharge.  PTA was on Lasix 20 mg daily   PAF Generally maintains NSR however yesterday night noted to be in atrial fibrillation with heart rates generally around 110, but can go up to 120s. Asymptomatic.  Due to AMS yesterday was not taking p.o.  Doing well today.  Will transition back to normal regimen.  Continue reduced dose of Eliquis 2.5 mg twice daily for renal function and age (this will be started tomorrow per pharmacy due to Lovenox today).  On Lopressor 25 mg twice daily, increasing to 50 mg twice daily.  She has significant anemia with no obvious etiology.   Will continue DOAC for now but need to closely monitor this. Continue amiodarone 200 mg daily Of note: Eliquis was stopped 01/21-1/22 due to lack of p.o. intake.  Will need at least 3 weeks of anticoagulation before considering possible DCCV again.   CKD Improving with diuresis.  On admission 1.72.  Currently 1.16   Anemia She has had a precipitous drop since December when it was normal.  Hemoglobin here has been in the low sevens but seems stable.  No obvious signs of bleeding.  Defer to primary team about workup.  AMS Yesterday he was alert but not responsive.  CT imaging was limited but did not show any acute abnormalities.  Mentation seems to be better today but still not at baseline.  Possibly just delirium from the hospital.  Further workup per primary team.  For questions or updates, please contact [facility] Please consult [email or web address] for contact info under        Signed, Abagail Kitchens, PA-C

## 2023-02-08 NOTE — Progress Notes (Signed)
Physical Therapy Treatment Patient Details Name: Brenda Matthews MRN: [id number] DOB: January 03, 1936 Today's Date: 02/08/2023   History of Present Illness Brenda Matthews is a 88 y.o. female admitted 02/04/23 for acute diastolic congestive heart failure. Of note, pt recently hospitalized 01/02/23 - 01/14/23 for right lower extremity cellulitis and heart failure decompensation. PMH: chronic renal insufficiency, chronic anemia, non-insulin-dependent diabetes, hyperlipidemia, HTN, afib, CHF, osteopenia    PT Comments  Pt oriented to self only, needing constant redirection to location and task at hand. Pt maintains eyes closed, opens them momentarily to cues, needing increased time to answer questions and intermittently appears to have word finding difficulty. Pt repeatedly asking for food, requesting "french bread" or "french toast" or "garlic bread", appears to become frustrated with elevated tone. Unable to cue pt to come to sitting EOB, states "ok" when therapist provides initiation and multimodal cues but no movement and pt appears resistant. Pt needing max A to roll R/L to reposition bedpad then assisted pt up in bed with all needs in reach. RN in room, aware of pt's cognition, HR 130s-100s and SpO2 93% on 1L. Continued to recommend continued inpatient follow up therapy, <3 hours/day.    If plan is discharge home, recommend the following: A lot of help with walking and/or transfers;A lot of help with bathing/dressing/bathroom;Assistance with cooking/housework;Assist for transportation   Can travel by private vehicle     No  Equipment Recommendations  None recommended by PT    Recommendations for Other Services       Precautions / Restrictions Precautions Precautions: Fall Restrictions Weight Bearing Restrictions Per Provider Order: No     Mobility  Bed Mobility               General bed mobility comments: when cued pt to come to EOB pt states "ok" but then no movement despite initiation  cues and assist, pt appears anxious and restless, eyes not fully open so assisted pt in scooting up in bed; pt needing max A for rolling R/L in bed to reposition linen and scoot pt up in bed    Transfers                        Ambulation/Gait                   Stairs             Wheelchair Mobility     Tilt Bed    Modified Rankin (Stroke Patients Only)       Balance                                            Cognition Arousal: Lethargic Behavior During Therapy: Anxious, Impulsive Overall Cognitive Status: No family/caregiver present to determine baseline cognitive functioning                                 General Comments: Pt able to state name appropriately, doesn't fully open eyes, responds to most questions but needs increased time and intermittent word finding difficulty; pt appears restless reaching for gown and lines, needing redirection        Exercises      General Comments General comments (skin integrity, edema, etc.): Pt on 1L O2, HR 130s to 100s, SpO2 93%  Pertinent Vitals/Pain Pain Assessment Pain Assessment: Faces Faces Pain Scale: Hurts a little bit Pain Location: facial wincing noted but denies pain    Home Living                          Prior Function            PT Goals (current goals can now be found in the care plan section) Acute Rehab PT Goals Patient Stated Goal: none stated PT Goal Formulation: With patient Time For Goal Achievement: 02/20/23 Potential to Achieve Goals: Fair Progress towards PT goals: Not progressing toward goals - comment (confusion)    Frequency    Min 1X/week      PT Plan      Co-evaluation              AM-PAC PT "6 Clicks" Mobility   Outcome Measure  Help needed turning from your back to your side while in a flat bed without using bedrails?: A Lot Help needed moving from lying on your back to sitting on the side of a  flat bed without using bedrails?: A Lot Help needed moving to and from a bed to a chair (including a wheelchair)?: A Lot Help needed standing up from a chair using your arms (e.g., wheelchair or bedside chair)?: A Lot Help needed to walk in hospital room?: Total Help needed climbing 3-5 steps with a railing? : Total 6 Click Score: 10    End of Session Equipment Utilized During Treatment: Oxygen Activity Tolerance: Patient limited by lethargy;Treatment limited secondary to agitation Patient left: in bed;with call bell/phone within reach;with bed alarm set;with nursing/sitter in room Nurse Communication: Mobility status PT Visit Diagnosis: Unsteadiness on feet (R26.81);Muscle weakness (generalized) (M62.81)     Time: 9518-8416 PT Time Calculation (min) (ACUTE ONLY): 16 min  Charges:    $Therapeutic Activity: 8-22 mins PT General Charges $$ ACUTE PT VISIT: 1 Visit                     Tori Shantanique Hodo PT, DPT 02/08/23, 12:30 PM

## 2023-02-08 NOTE — Plan of Care (Signed)
  Problem: Education: Goal: Ability to describe self-care measures that may prevent or decrease complications (Diabetes Survival Skills Education) will improve Outcome: Progressing   Problem: Coping: Goal: Ability to adjust to condition or change in health will improve Outcome: Progressing   Problem: Fluid Volume: Goal: Ability to maintain a balanced intake and output will improve Outcome: Progressing   Problem: Health Behavior/Discharge Planning: Goal: Ability to manage health-related needs will improve Outcome: Progressing   Problem: Metabolic: Goal: Ability to maintain appropriate glucose levels will improve Outcome: Progressing   Problem: Nutritional: Goal: Maintenance of adequate nutrition will improve Outcome: Progressing Goal: Progress toward achieving an optimal weight will improve Outcome: Progressing   Problem: Skin Integrity: Goal: Risk for impaired skin integrity will decrease Outcome: Progressing   Problem: Tissue Perfusion: Goal: Adequacy of tissue perfusion will improve Outcome: Progressing   Problem: Education: Goal: Knowledge of General Education information will improve Description: Including pain rating scale, medication(s)/side effects and non-pharmacologic comfort measures Outcome: Progressing   Problem: Health Behavior/Discharge Planning: Goal: Ability to manage health-related needs will improve Outcome: Progressing   Problem: Clinical Measurements: Goal: Ability to maintain clinical measurements within normal limits will improve Outcome: Progressing Goal: Will remain free from infection Outcome: Progressing Goal: Diagnostic test results will improve Outcome: Progressing Goal: Respiratory complications will improve Outcome: Progressing Goal: Cardiovascular complication will be avoided Outcome: Progressing   Problem: Nutrition: Goal: Adequate nutrition will be maintained Outcome: Progressing   Problem: Elimination: Goal: Will not  experience complications related to bowel motility Outcome: Progressing Goal: Will not experience complications related to urinary retention Outcome: Progressing   Problem: Skin Integrity: Goal: Risk for impaired skin integrity will decrease Outcome: Progressing   Problem: Health Behavior/Discharge Planning: Goal: Ability to manage health-related needs will improve Outcome: Progressing   Problem: Clinical Measurements: Goal: Diagnostic test results will improve Outcome: Progressing   Problem: Activity: Goal: Risk for activity intolerance will decrease Outcome: Progressing   Problem: Nutrition: Goal: Adequate nutrition will be maintained Outcome: Progressing   Problem: Skin Integrity: Goal: Risk for impaired skin integrity will decrease Outcome: Progressing   Problem: Pain Managment: Goal: General experience of comfort will improve and/or be controlled Outcome: Adequate for Discharge   Problem: Pain Managment: Goal: General experience of comfort will improve and/or be controlled Outcome: Adequate for Discharge

## 2023-02-08 NOTE — Progress Notes (Signed)
Patient was alert to self, place and situation when this RN got her during shift changed. Slept through the night. Answers when we try to wake her up but does not open her eyes. This early morning, she's just alert to self, disoriented to place, time, and situation. Pt is calm and not trying to pull the tubes. Will continue to monitor.

## 2023-02-08 NOTE — Progress Notes (Signed)
PROGRESS NOTE    Brenda Matthews  HYQ:[id number] DOB: [birth year] DOA: 02/04/2023 PCP: Karna Dupes, MD  Chief Complaint  Patient presents with   Respiratory Distress    Brief Narrative:   88 year old female with atrial fibrillation, diastolic heart failure who presents to the hospital with shortness of breath, orthopnea and pedal edema up to her thighs from her nursing facility. She presented on Lynnette Pote CPAP and was transition to Kayia Billinger BiPAP. Eventually was transitioned down to 4 L of oxygen.   Treated for HF exacerbation.  Hospitalization complicated by delirium and RVR.  Cards following.   Assessment & Plan:   Principal Problem:   Acute diastolic (congestive) heart failure (HCC) Active Problems:   Atrial fibrillation and flutter (HCC)  Heart Failure with Preserved Ejection Fraction  Acute Diastolic Heart Failure She has dependent edema to hips, still with O2 requirement CXR 1/18 with findings c/w congestive HF (+ bibasilar atelectasis or consolidation) Will continue IV lasix for now Jardiance currently on hold  Strict I/O, daily weights  Atrial fibrillation/flutter with RVR Amiodarone, metoprolol, eliquis Was on lovenox given inability to take PO, will transition back to eliquis Will defer rate control to cardiology  Acute Metabolic Encephalopathy Delirium  Head CT limited, but no acute intracranial process noted She's improving - suspect this was hospital acquired delirium Delirium precautions  CKD IIIb Monitor on diuretics Creatinine better than recent baseline  Anemia Appears c/w AOCD, elevated b12, normal folate.  Normal iron/ferritin.  No signs of bleeding. Will monitor, transfuse for <7.  Hb drop noted in December.   Hypertension Hydralazine on hold Continue metop  Dyslipidemia Lipitor  Neuropathic Pain Gabapentin    DVT prophylaxis: eliquis Code Status: full Family Communication: noen Disposition:   Status is: Inpatient Remains inpatient  appropriate because: need for continued inpatient care   Consultants:  cards  Procedures:  none  Antimicrobials:  Anti-infectives (From admission, onward)    None       Subjective: Asking why she's here When she'll be discharged  Objective: Vitals:   02/07/23 2341 02/08/23 0143 02/08/23 0328 02/08/23 0747  BP: 133/69  125/74 (!) 145/84  Pulse: (!) 115  (!) 129 (!) 126  Resp: 20  (!) 23 18  Temp: 98.9 F (37.2 C)  98.5 F (36.9 C) 98.6 F (37 C)  TempSrc: Oral  Oral Oral  SpO2: 99%  96% 97%  Weight:  77.2 kg    Height:        Intake/Output Summary (Last 24 hours) at 02/08/2023 0905 Last data filed at 02/08/2023 0536 Gross per 24 hour  Intake 1207.69 ml  Output 2000 ml  Net -792.31 ml   Filed Weights   02/05/23 0518 02/07/23 0900 02/08/23 0143  Weight: 82.3 kg 80.3 kg 77.2 kg    Examination:  General exam: 88 yo woman in NAD Respiratory system: Clear to auscultation. Respiratory effort normal. Cardiovascular system: tachy, irregularly irregular Central nervous system: mild confusion, Dean Wonder&O, but doesn't know why she's here or what we're doing for her, Maurica Omura little delayed in her responses/processing of information.  Moving all extremities.  Extremities: dependent edema noted to hips, trace edema to LE's   Data Reviewed: I have personally reviewed following labs and imaging studies  CBC: Recent Labs  Lab 02/04/23 0550 02/05/23 0530 02/07/23 0457 02/08/23 0453  WBC 10.4 9.2 8.3 7.7  NEUTROABS 7.9*  --   --   --   HGB 7.6* 7.5* 7.6* 7.3*  HCT 25.9* 26.5* 25.3* 24.6*  MCV 98.5 99.3 95.5 95.3  PLT 173 167 197 167    Basic Metabolic Panel: Recent Labs  Lab 02/04/23 0550 02/05/23 0530 02/07/23 0457 02/08/23 0453  NA 142 141 144 144  K 3.7 4.0 3.6 3.6  CL 107 107 106 101  CO2 28 29 29  35*  GLUCOSE 106* 97 93 86  BUN 31* 29* 32* 26*  CREATININE 1.72* 1.55* 1.39* 1.16*  CALCIUM 9.1 8.9 8.5* 8.8*    GFR: Estimated Creatinine Clearance: 32.8 mL/min  (Thoms Barthelemy) (by C-G formula based on SCr of 1.16 mg/dL (H)).  Liver Function Tests: Recent Labs  Lab 02/04/23 0550  AST 28  ALT 21  ALKPHOS 84  BILITOT 0.7  PROT 6.0*  ALBUMIN 2.7*    CBG: Recent Labs  Lab 02/07/23 1609 02/07/23 2136 02/07/23 2218 02/08/23 0009 02/08/23 0725  GLUCAP 133* 80 75 83 108*     Recent Results (from the past 240 hours)  Culture, blood (Routine x 2)     Status: None (Preliminary result)   Collection Time: 02/04/23  5:52 AM   Specimen: BLOOD RIGHT ARM  Result Value Ref Range Status   Specimen Description   Final    BLOOD RIGHT ARM Performed at [facility], [address] [place], [place] [address]    Special Requests   Final    BOTTLES DRAWN AEROBIC AND ANAEROBIC Blood Culture results may not be optimal due to an inadequate volume of blood received in culture bottles Performed at [facility], [address] [place], [place] [address]    Culture   Final    NO GROWTH 3 DAYS Performed at [facility], [address] [place], [place] [address]    Report Status PENDING  Incomplete  Culture, blood (Routine x 2)     Status: None (Preliminary result)   Collection Time: 02/04/23  6:57 AM   Specimen: BLOOD RIGHT HAND  Result Value Ref Range Status   Specimen Description   Final    BLOOD RIGHT HAND Performed at [facility] [place] [facility], [address] [place], [place] [address]    Special Requests   Final    BOTTLES DRAWN AEROBIC ONLY Blood Culture results may not be optimal due to an inadequate volume of blood received in culture bottles Performed at [facility], [address], [place], [place] [address]    Culture   Final    NO GROWTH 3 DAYS Performed at [facility], [address], [place], [place] [address]    Report Status PENDING  Incomplete  Resp panel by RT-PCR (RSV, Flu Julianah Marciel&B, Covid) Anterior Nasal Swab     Status: None   Collection Time: 02/04/23  8:02 AM   Specimen: Anterior Nasal  Swab  Result Value Ref Range Status   SARS Coronavirus 2 by RT PCR NEGATIVE NEGATIVE Final    Comment: (NOTE) SARS-CoV-2 target nucleic acids are NOT DETECTED.  The SARS-CoV-2 RNA is generally detectable in upper respiratory specimens during the acute phase of infection. The lowest concentration of SARS-CoV-2 viral copies this assay can detect is 138 copies/mL. Aloys Hupfer negative result does not preclude SARS-Cov-2 infection and should not be used as the sole basis for treatment or other patient management decisions. Wallace Cogliano negative result may occur with  improper specimen collection/handling, submission of specimen other than nasopharyngeal swab, presence of viral mutation(s) within the areas targeted by this assay, and inadequate number of viral copies(<138 copies/mL). Mathew Postiglione negative result  must be combined with clinical observations, patient history, and epidemiological information. The expected result is Negative.  Fact Sheet for Patients:  BloggerCourse.com  Fact Sheet for Healthcare Providers:  SeriousBroker.it  This test is no t yet approved or cleared by the [place] FDA and  has been authorized for detection and/or diagnosis of SARS-CoV-2 by FDA under an Emergency Use Authorization (EUA). This EUA will remain  in effect (meaning this test can be used) for the duration of the COVID-19 declaration under Section 564(b)(1) of the Act, 21 U.S.C.section 360bbb-3(b)(1), unless the authorization is terminated  or revoked sooner.       Influenza Kamyiah Colantonio by PCR NEGATIVE NEGATIVE Final   Influenza B by PCR NEGATIVE NEGATIVE Final    Comment: (NOTE) The Xpert Xpress SARS-CoV-2/FLU/RSV plus assay is intended as an aid in the diagnosis of influenza from Nasopharyngeal swab specimens and should not be used as Shernell Saldierna sole basis for treatment. Nasal washings and aspirates are unacceptable for Xpert Xpress SARS-CoV-2/FLU/RSV testing.  Fact Sheet for  Patients: BloggerCourse.com  Fact Sheet for Healthcare Providers: SeriousBroker.it  This test is not yet approved or cleared by the [place] FDA and has been authorized for detection and/or diagnosis of SARS-CoV-2 by FDA under an Emergency Use Authorization (EUA). This EUA will remain in effect (meaning this test can be used) for the duration of the COVID-19 declaration under Section 564(b)(1) of the Act, 21 U.S.C. section 360bbb-3(b)(1), unless the authorization is terminated or revoked.     Resp Syncytial Virus by PCR NEGATIVE NEGATIVE Final    Comment: (NOTE) Fact Sheet for Patients: BloggerCourse.com  Fact Sheet for Healthcare Providers: SeriousBroker.it  This test is not yet approved or cleared by the [place] FDA and has been authorized for detection and/or diagnosis of SARS-CoV-2 by FDA under an Emergency Use Authorization (EUA). This EUA will remain in effect (meaning this test can be used) for the duration of the COVID-19 declaration under Section 564(b)(1) of the Act, 21 U.S.C. section 360bbb-3(b)(1), unless the authorization is terminated or revoked.  Performed at [facility], [address], [place], [place] [address]          Radiology Studies: CT HEAD WO CONTRAST ( ) Result Date: 02/07/2023 CLINICAL DATA:  Altered mental status, severe delirium EXAM: CT HEAD WITHOUT CONTRAST TECHNIQUE: Contiguous axial images were obtained from the base of the skull through the vertex without intravenous contrast. RADIATION DOSE REDUCTION: This exam was performed according to the departmental dose-optimization program which includes automated exposure control, adjustment of the mA and/or kV according to patient size and/or use of iterative reconstruction technique. COMPARISON:  10/13/2017 FINDINGS: Evaluation is limited by motion. Brain: No evidence  of acute infarction, hemorrhage, mass, mass effect, or midline shift. No hydrocephalus or extra-axial fluid collection. Periventricular white matter changes, likely the sequela of chronic small vessel ischemic disease. Age related cerebral atrophy. Vascular: No hyperdense vessel. Atherosclerotic calcifications in the intracranial carotid and vertebral arteries. Skull: Negative for fracture or focal lesion. Sinuses/Orbits: No acute finding. IMPRESSION: Evaluation is limited by motion. Within this limitation, no acute intracranial process. Electronically Signed   By: Wiliam Ke M.D.   On: 02/07/2023 15:33        Scheduled Meds:  atorvastatin  40 mg Oral q1800   calcium carbonate  1 tablet Oral Q breakfast   enoxaparin (LOVENOX) injection  80 mg Subcutaneous Daily   fluticasone  2 spray Each Nare Daily   furosemide  40 mg Intravenous BID  gabapentin  100 mg Oral TID   insulin aspart  0-15 Units Subcutaneous TID WC   insulin aspart  0-5 Units Subcutaneous QHS   potassium chloride  30 mEq Oral Daily   Continuous Infusions:  amiodarone 30 mg/hr (02/08/23 0510)     LOS: 4 days    Time spent: over 30 min     Lacretia Nicks, MD [facility]   To contact the attending provider between 7A-7P or the covering provider during after hours 7P-7A, please log into the web site [email or web address] and access using universal [place] password for that web site. If you do not have the password, please call the hospital operator.  02/08/2023, 9:05 AM

## 2023-02-09 ENCOUNTER — Inpatient Hospital Stay (HOSPITAL_COMMUNITY): Payer: Medicare PPO

## 2023-02-09 DIAGNOSIS — I5031 Acute diastolic (congestive) heart failure: Secondary | ICD-10-CM | POA: Diagnosis not present

## 2023-02-09 DIAGNOSIS — I480 Paroxysmal atrial fibrillation: Secondary | ICD-10-CM | POA: Diagnosis not present

## 2023-02-09 LAB — BASIC METABOLIC PANEL
Anion gap: 10 (ref 5–15)
BUN: 26 mg/dL — ABNORMAL HIGH (ref 8–23)
CO2: 35 mmol/L — ABNORMAL HIGH (ref 22–32)
Calcium: 9.2 mg/dL (ref 8.9–10.3)
Chloride: 100 mmol/L (ref 98–111)
Creatinine, Ser: 1.54 mg/dL — ABNORMAL HIGH (ref 0.44–1.00)
GFR, Estimated: 33 mL/min — ABNORMAL LOW (ref >60)
Glucose, Bld: 103 mg/dL — ABNORMAL HIGH (ref 70–99)
Potassium: 3.9 mmol/L (ref 3.5–5.1)
Sodium: 145 mmol/L (ref 135–145)

## 2023-02-09 LAB — URINALYSIS, ROUTINE W REFLEX MICROSCOPIC
Bilirubin Urine: NEGATIVE
Glucose, UA: NEGATIVE mg/dL
Ketones, ur: NEGATIVE mg/dL
Nitrite: NEGATIVE
Protein, ur: 30 mg/dL — AB
Specific Gravity, Urine: 1.015 (ref 1.005–1.030)
WBC, UA: >50 WBC/hpf (ref 0–5)
pH: 6 (ref 5.0–8.0)

## 2023-02-09 LAB — CBC
HCT: 29.3 % — ABNORMAL LOW (ref 36.0–46.0)
Hemoglobin: 8.3 g/dL — ABNORMAL LOW (ref 12.0–15.0)
MCH: 27.9 pg (ref 26.0–34.0)
MCHC: 28.3 g/dL — ABNORMAL LOW (ref 30.0–36.0)
MCV: 98.7 fL (ref 80.0–100.0)
Platelets: 180 10*3/uL (ref 150–400)
RBC: 2.97 MIL/uL — ABNORMAL LOW (ref 3.87–5.11)
RDW: 16.6 % — ABNORMAL HIGH (ref 11.5–15.5)
WBC: 8.1 10*3/uL (ref 4.0–10.5)
nRBC: 0 % (ref 0.0–0.2)

## 2023-02-09 LAB — GLUCOSE, CAPILLARY
Glucose-Capillary: 109 mg/dL — ABNORMAL HIGH (ref 70–99)
Glucose-Capillary: 147 mg/dL — ABNORMAL HIGH (ref 70–99)
Glucose-Capillary: 99 mg/dL (ref 70–99)
Glucose-Capillary: 112 mg/dL — ABNORMAL HIGH (ref 70–99)

## 2023-02-09 LAB — CULTURE, BLOOD (ROUTINE X 2)

## 2023-02-09 LAB — PHOSPHORUS: Phosphorus: 3.5 mg/dL (ref 2.5–4.6)

## 2023-02-09 LAB — MAGNESIUM: Magnesium: 2.3 mg/dL (ref 1.7–2.4)

## 2023-02-09 MED ORDER — FUROSEMIDE 40 MG PO TABS
40.0000 mg | ORAL_TABLET | Freq: Every day | ORAL | Status: DC
  Administered 2023-02-09 – 2023-02-10 (×2): 40 mg via ORAL
  Filled 2023-02-09 (×2): qty 1

## 2023-02-09 MED ORDER — CHLORHEXIDINE GLUCONATE CLOTH 2 % EX PADS
6.0000 | MEDICATED_PAD | Freq: Every day | CUTANEOUS | Status: DC
  Administered 2023-02-10 – 2023-02-13 (×5): 6 via TOPICAL

## 2023-02-09 NOTE — Progress Notes (Addendum)
Patient Name: Brenda Matthews Date of Encounter: 02/09/2023 [facility] Cardiologist: Chrystie Nose, MD   Interval Summary  .    Mentation improved however still seems off from when she was admitted originally.  Denies any shortness of breath.  She is wanting to go home.  Kidney function bumped  Vital Signs .    Vitals:   02/08/23 1900 02/08/23 2044 02/09/23 0453 02/09/23 0455  BP: (!) 153/84 112/81 (!) 159/95   Pulse: (!) 125 (!) 111 (!) 105   Resp: 19 20 18    Temp: 97.7 F (36.5 C) 98.3 F (36.8 C) 98.3 F (36.8 C)   TempSrc:  Oral Oral   SpO2: 96% 98% 94%   Weight:    77.2 kg  Height:        Intake/Output Summary (Last 24 hours) at 02/09/2023 0744 Last data filed at 02/09/2023 0743 Gross per 24 hour  Intake 1455.76 ml  Output 1620 ml  Net -164.24 ml      02/09/2023    4:55 AM 02/08/2023    1:43 AM 02/07/2023    9:00 AM  Last 3 Weights  Weight (lbs) 170 lb 3.1 oz 170 lb 3.2 oz 177 lb 0.5 oz  Weight (kg) 77.2 kg 77.202 kg 80.3 kg      Telemetry/ECG    Atrial fibrillation heart rate 110s - Personally Reviewed  CV Studies    Echocardiogram 01/03/2023 1. Left ventricular ejection fraction, by estimation, is 60 to 65%. The  left ventricle has normal function. The left ventricle has no regional  wall motion abnormalities. There is mild concentric left ventricular  hypertrophy. Left ventricular diastolic  parameters are consistent with Grade I diastolic dysfunction (impaired  relaxation).   2. Right ventricular systolic function is normal. The right ventricular  size is normal. There is normal pulmonary artery systolic pressure.   3. Left atrial size was moderately dilated.   4. Right atrial size was moderately dilated.   5. The mitral valve is normal in structure. Trivial mitral valve  regurgitation. No evidence of mitral stenosis.   6. The aortic valve is tricuspid. There is mild calcification of the  aortic valve. Aortic valve regurgitation is mild.  No aortic stenosis is  present.   7. Aortic dilatation noted. There is mild dilatation of the ascending  aorta, measuring 40 mm.   8. The inferior vena cava is normal in size with <50% respiratory  variability, suggesting right atrial pressure of 8 mmHg.    Physical Exam .   GEN: No acute distress.   Neck: no JVD Cardiac: RRR, no murmurs, rubs, or gallops.  Respiratory: + crackles, seems more diffuse today GI: Soft, nontender, non-distended  MS: no edema  Patient Profile    SABRIYAH JUDE is a 88 y.o. female has hx of chronic HFpEF, PAF, hypertension, hyperlipidemia, CKD.  Admitted for CHF exacerbation and rate control.  Assessment & Plan .     Acute on chronic HFpEF exacerbation Recent admission in December 2024.  Possibly under diuresed or question of Lasix response or not on normal regiment.  Suspect her baseline body weight is closer to 170 pounds based off of previous office notes.  Suspect she is euvolemic now.  Body weight is at baseline and kidney function suddenly decreased.  Still remains on supplemental oxygen 1L, but denies any shortness of breath.  Continue to try to wean down and ambulate patient.  PTA was on 20 mg of Lasix, suspect that she  had decreased response with this.  Stop IV Lasix, transition to p.o. Lasix 40 mg daily Holding off on restarting SGLT2 inhibitor with confusion, looks like she might have a UTI.   PAF Generally maintains NSR however this admission went into atrial fibrillation.  Asymptomatic.   Rates are better controlled in the 110s with increased dose of Lopressor  Continue reduced dose of Eliquis 2.5 mg twice daily for renal function and age, continue Lopressor 50 mg twice daily.  She has significant anemia with no obvious etiology.  Will continue DOAC for now but need to closely monitor this.  Further workup per primary team Continue amiodarone 200 mg daily Of note: Eliquis was stopped 01/21-1/22 due to lack of p.o. intake.  Will need at least  3 weeks of anticoagulation before considering possible DCCV again.   CKD Improving with diuresis.  On admission 1.72.  Kidney function went from 1.16-1.54 today.  Suspect euvolemia.     Anemia She has had a precipitous drop since December when it was normal.  Hemoglobin here has been in the low sevens but seems stable.  No obvious signs of bleeding.  Defer to primary team about workup.  AMS Still not at baseline but mentation does seem improved.  CT of the head this admission did not show any acute abnormalities.  UA was done yesterday that looks like she might have a UTI, but does not look like she has been's prescribed any antibiotics.  Will also order chest x-ray today, has more diffuse crackles but looks euvolemic, possible pneumonia.  Spoke with the daughter-in-law [name] yesterday extensively about ongoing management for her CHF and provided an update.  For questions or updates, please contact [facility] Please consult [email or web address] for contact info under        Signed, Abagail Kitchens, PA-C

## 2023-02-09 NOTE — Progress Notes (Addendum)
PROGRESS NOTE    Brenda Matthews  XBJ:[id number] DOB: 01/12/1936 DOA: 02/04/2023 PCP: Karna Dupes, MD  Chief Complaint  Patient presents with   Respiratory Distress    Brief Narrative:   88 year old female with atrial fibrillation, diastolic heart failure who presents to the hospital with shortness of breath, orthopnea and pedal edema up to her thighs from her nursing facility. She presented on Jan Olano CPAP and was transition to Jaci Desanto BiPAP. Eventually was transitioned down to 4 L of oxygen.   Treated for HF exacerbation.  Hospitalization complicated by delirium and RVR.  Cards following.   Assessment & Plan:   Principal Problem:   Acute diastolic (congestive) heart failure (HCC) Active Problems:   Atrial fibrillation and flutter (HCC)  Heart Failure with Preserved Ejection Fraction  Acute Diastolic Heart Failure She has dependent edema to hips, still with O2 requirement CXR 1/18 with findings c/w congestive HF (+ bibasilar atelectasis or consolidation) Transitioned to PO lasix per cardiology Jardiance currently on hold  Strict I/O, daily weights  Atrial fibrillation/flutter with RVR Amiodarone, metoprolol, eliquis Was on lovenox given inability to take PO, will transition back to eliquis Will defer rate control to cardiology  Acute Metabolic Encephalopathy Delirium  Head CT limited, but no acute intracranial process noted She's improving, but not to her baseline - suspect this was hospital acquired delirium Delirium precautions  CKD IIIb Monitor on diuretics Creatinine better than recent baseline  Anemia Appears c/w AOCD, elevated b12, normal folate.  Normal iron/ferritin.  No signs of bleeding. Will monitor, transfuse for <7.  Hb drop noted in December.   Hypertension Hydralazine on hold Continue metop  Dyslipidemia Lipitor  Neuropathic Pain Gabapentin    DVT prophylaxis: eliquis Code Status: full Family Communication: noen Disposition:   Status is:  Inpatient Remains inpatient appropriate because: need for continued inpatient care   Consultants:  cards  Procedures:  none  Antimicrobials:  Anti-infectives (From admission, onward)    None       Subjective: Asking why she's here When she'll be discharged  Objective: Vitals:   02/09/23 1000 02/09/23 1028 02/09/23 1120 02/09/23 1123  BP:  130/74    Pulse:  (!) 137    Resp: (!) 24 16    Temp:  97.6 F (36.4 C)    TempSrc:  Oral    SpO2: 92% 92% (!) 85% 92%  Weight:      Height:        Intake/Output Summary (Last 24 hours) at 02/09/2023 1137 Last data filed at 02/09/2023 0856 Gross per 24 hour  Intake 1215.76 ml  Output 1970 ml  Net -754.24 ml   Filed Weights   02/07/23 0900 02/08/23 0143 02/09/23 0455  Weight: 80.3 kg 77.2 kg 77.2 kg    Examination:  General: No acute distress. Cardiovascular: irregularly irregular Lungs: diminished Neurological: Alert, but still confused. Moves all extremities 4. Cranial nerves II through XII grossly intact. Skin: Warm and dry. No rashes or lesions. Extremities: dependent edema    Data Reviewed: I have personally reviewed following labs and imaging studies  CBC: Recent Labs  Lab 02/04/23 0550 02/05/23 0530 02/07/23 0457 02/08/23 0453 02/09/23 0438  WBC 10.4 9.2 8.3 7.7 8.1  NEUTROABS 7.9*  --   --   --   --   HGB 7.6* 7.5* 7.6* 7.3* 8.3*  HCT 25.9* 26.5* 25.3* 24.6* 29.3*  MCV 98.5 99.3 95.5 95.3 98.7  PLT 173 167 197 167 180    Basic Metabolic Panel: Recent  Labs  Lab 02/04/23 0550 02/05/23 0530 02/07/23 0457 02/08/23 0453 02/09/23 0438  NA 142 141 144 144 145  K 3.7 4.0 3.6 3.6 3.9  CL 107 107 106 101 100  CO2 28 29 29  35* 35*  GLUCOSE 106* 97 93 86 103*  BUN 31* 29* 32* 26* 26*  CREATININE 1.72* 1.55* 1.39* 1.16* 1.54*  CALCIUM 9.1 8.9 8.5* 8.8* 9.2  MG  --   --   --   --  2.3  PHOS  --   --   --   --  3.5    GFR: Estimated Creatinine Clearance: 24.7 mL/min (Eyoel Throgmorton) (by C-G formula based on  SCr of 1.54 mg/dL (H)).  Liver Function Tests: Recent Labs  Lab 02/04/23 0550  AST 28  ALT 21  ALKPHOS 84  BILITOT 0.7  PROT 6.0*  ALBUMIN 2.7*    CBG: Recent Labs  Lab 02/08/23 1131 02/08/23 1608 02/08/23 2157 02/09/23 0731 02/09/23 1054  GLUCAP 155* 151* 129* 109* 147*     Recent Results (from the past 240 hours)  Culture, blood (Routine x 2)     Status: None (Preliminary result)   Collection Time: 02/04/23  5:52 AM   Specimen: BLOOD RIGHT ARM  Result Value Ref Range Status   Specimen Description   Final    BLOOD RIGHT ARM Performed at [facility], [address], [place], [place] [address]    Special Requests   Final    BOTTLES DRAWN AEROBIC AND ANAEROBIC Blood Culture results may not be optimal due to an inadequate volume of blood received in culture bottles Performed at [facility], [address], [place], [place] [address]    Culture   Final    NO GROWTH 4 DAYS Performed at [facility], [address] [place], [place] [address]    Report Status PENDING  Incomplete  Culture, blood (Routine x 2)     Status: None (Preliminary result)   Collection Time: 02/04/23  6:57 AM   Specimen: BLOOD RIGHT HAND  Result Value Ref Range Status   Specimen Description   Final    BLOOD RIGHT HAND Performed at [facility], [address] [place], [place] [address]    Special Requests   Final    BOTTLES DRAWN AEROBIC ONLY Blood Culture results may not be optimal due to an inadequate volume of blood received in culture bottles Performed at [facility], [address] [place], [place] [address]    Culture   Final    NO GROWTH 4 DAYS Performed at [facility], [address], [place], [place] [address]    Report Status PENDING  Incomplete  Resp panel by RT-PCR (RSV, Flu Beckett Hickmon&B, Covid) Anterior Nasal Swab     Status: None   Collection Time: 02/04/23  8:02 AM   Specimen: Anterior Nasal Swab  Result Value Ref  Range Status   SARS Coronavirus 2 by RT PCR NEGATIVE NEGATIVE Final    Comment: (NOTE) SARS-CoV-2 target nucleic acids are NOT DETECTED.  The SARS-CoV-2 RNA is generally detectable in upper respiratory specimens during the acute phase of infection. The lowest concentration of SARS-CoV-2 viral copies this assay can detect is 138 copies/mL. Lei Dower negative result does not preclude SARS-Cov-2 infection and should not be used as the sole basis for treatment or other patient management decisions. Brylinn Teaney negative result may occur with  improper specimen collection/handling, submission of specimen other than nasopharyngeal  swab, presence of viral mutation(s) within the areas targeted by this assay, and inadequate number of viral copies(<138 copies/mL). Ferrin Liebig negative result must be combined with clinical observations, patient history, and epidemiological information. The expected result is Negative.  Fact Sheet for Patients:  BloggerCourse.com  Fact Sheet for Healthcare Providers:  SeriousBroker.it  This test is no t yet approved or cleared by the [place] FDA and  has been authorized for detection and/or diagnosis of SARS-CoV-2 by FDA under an Emergency Use Authorization (EUA). This EUA will remain  in effect (meaning this test can be used) for the duration of the COVID-19 declaration under Section 564(b)(1) of the Act, 21 U.S.C.section 360bbb-3(b)(1), unless the authorization is terminated  or revoked sooner.       Influenza Earsie Humm by PCR NEGATIVE NEGATIVE Final   Influenza B by PCR NEGATIVE NEGATIVE Final    Comment: (NOTE) The Xpert Xpress SARS-CoV-2/FLU/RSV plus assay is intended as an aid in the diagnosis of influenza from Nasopharyngeal swab specimens and should not be used as Amahia Madonia sole basis for treatment. Nasal washings and aspirates are unacceptable for Xpert Xpress SARS-CoV-2/FLU/RSV testing.  Fact Sheet for  Patients: BloggerCourse.com  Fact Sheet for Healthcare Providers: SeriousBroker.it  This test is not yet approved or cleared by the [place] FDA and has been authorized for detection and/or diagnosis of SARS-CoV-2 by FDA under an Emergency Use Authorization (EUA). This EUA will remain in effect (meaning this test can be used) for the duration of the COVID-19 declaration under Section 564(b)(1) of the Act, 21 U.S.C. section 360bbb-3(b)(1), unless the authorization is terminated or revoked.     Resp Syncytial Virus by PCR NEGATIVE NEGATIVE Final    Comment: (NOTE) Fact Sheet for Patients: BloggerCourse.com  Fact Sheet for Healthcare Providers: SeriousBroker.it  This test is not yet approved or cleared by the [place] FDA and has been authorized for detection and/or diagnosis of SARS-CoV-2 by FDA under an Emergency Use Authorization (EUA). This EUA will remain in effect (meaning this test can be used) for the duration of the COVID-19 declaration under Section 564(b)(1) of the Act, 21 U.S.C. section 360bbb-3(b)(1), unless the authorization is terminated or revoked.  Performed at [facility], [address], [place], [place] [address]          Radiology Studies: DG Chest 2 View Result Date: 02/09/2023 CLINICAL DATA:  88 year old female "pneumonia". EXAM: CHEST - 2 VIEW COMPARISON:  Portable chest 02/04/2023 and earlier. FINDINGS: AP and lateral views at 0909 hours. Large lung volumes. Decreased but not resolved bilateral layering pleural effusions, now small and still larger on the left. Evidence of underlying cardiomegaly. Calcified aortic atherosclerosis. No pneumothorax or pulmonary edema. Pulmonary vascularity appears to be at baseline, no overt edema. No pneumothorax. Visualized tracheal air column is within normal limits. Osteopenia. No acute  osseous abnormality identified. Paucity of bowel gas. IMPRESSION: 1. Decreased but not resolved bilateral layering pleural effusions, still larger on the left. 2. No other acute cardiopulmonary abnormality. Cardiomegaly. Aortic Atherosclerosis (ICD10-I70.0). Electronically Signed   By: Odessa Fleming M.D.   On: 02/09/2023 09:19   CT HEAD WO CONTRAST ( ) Result Date: 02/07/2023 CLINICAL DATA:  Altered mental status, severe delirium EXAM: CT HEAD WITHOUT CONTRAST TECHNIQUE: Contiguous axial images were obtained from the base of the skull through the vertex without intravenous contrast. RADIATION DOSE REDUCTION: This exam was performed according to the departmental dose-optimization program which includes automated exposure control, adjustment of the mA and/or kV according to patient  size and/or use of iterative reconstruction technique. COMPARISON:  10/13/2017 FINDINGS: Evaluation is limited by motion. Brain: No evidence of acute infarction, hemorrhage, mass, mass effect, or midline shift. No hydrocephalus or extra-axial fluid collection. Periventricular white matter changes, likely the sequela of chronic small vessel ischemic disease. Age related cerebral atrophy. Vascular: No hyperdense vessel. Atherosclerotic calcifications in the intracranial carotid and vertebral arteries. Skull: Negative for fracture or focal lesion. Sinuses/Orbits: No acute finding. IMPRESSION: Evaluation is limited by motion. Within this limitation, no acute intracranial process. Electronically Signed   By: Wiliam Ke M.D.   On: 02/07/2023 15:33        Scheduled Meds:  amiodarone  200 mg Oral Daily   apixaban  2.5 mg Oral BID   atorvastatin  40 mg Oral q1800   calcium carbonate  1 tablet Oral Q breakfast   fluticasone  2 spray Each Nare Daily   furosemide  40 mg Oral Daily   gabapentin  100 mg Oral TID   insulin aspart  0-15 Units Subcutaneous TID WC   insulin aspart  0-5 Units Subcutaneous QHS   metoprolol tartrate  50 mg  Oral BID   polyethylene glycol  17 g Oral BID   potassium chloride  30 mEq Oral Daily   Continuous Infusions:     LOS: 5 days    Time spent: over 30 min     Lacretia Nicks, MD [facility]   To contact the attending provider between 7A-7P or the covering provider during after hours 7P-7A, please log into the web site [email or web address] and access using universal [place] password for that web site. If you do not have the password, please call the hospital operator.  02/09/2023, 11:37 AM

## 2023-02-09 NOTE — Plan of Care (Addendum)
Patient AO X 1-2, forgetful of place, time and situation. Remains on 1L [address], tried weaning to room air per provider request and patient dropped to 85%, especially if talking, eating or moving. Retaining urine, foley placed after third bladder scan >300. 600 cc's out for day shift. Tremors and anxiety.  Problem: Education: Goal: Ability to describe self-care measures that may prevent or decrease complications (Diabetes Survival Skills Education) will improve Outcome: Progressing Goal: Individualized Educational Video(s) Outcome: Progressing   Problem: Coping: Goal: Ability to adjust to condition or change in health will improve Outcome: Progressing   Problem: Fluid Volume: Goal: Ability to maintain a balanced intake and output will improve Outcome: Progressing   Problem: Health Behavior/Discharge Planning: Goal: Ability to identify and utilize available resources and services will improve Outcome: Progressing Goal: Ability to manage health-related needs will improve Outcome: Progressing   Problem: Metabolic: Goal: Ability to maintain appropriate glucose levels will improve Outcome: Progressing   Problem: Nutritional: Goal: Maintenance of adequate nutrition will improve Outcome: Progressing Goal: Progress toward achieving an optimal weight will improve Outcome: Progressing   Problem: Skin Integrity: Goal: Risk for impaired skin integrity will decrease Outcome: Progressing   Problem: Tissue Perfusion: Goal: Adequacy of tissue perfusion will improve Outcome: Progressing   Problem: Education: Goal: Knowledge of General Education information will improve Description: Including pain rating scale, medication(s)/side effects and non-pharmacologic comfort measures Outcome: Progressing   Problem: Health Behavior/Discharge Planning: Goal: Ability to manage health-related needs will improve Outcome: Progressing   Problem: Clinical Measurements: Goal: Ability to maintain clinical  measurements within normal limits will improve Outcome: Progressing Goal: Will remain free from infection Outcome: Progressing Goal: Diagnostic test results will improve Outcome: Progressing Goal: Respiratory complications will improve Outcome: Progressing Goal: Cardiovascular complication will be avoided Outcome: Progressing   Problem: Activity: Goal: Risk for activity intolerance will decrease Outcome: Progressing   Problem: Nutrition: Goal: Adequate nutrition will be maintained Outcome: Progressing   Problem: Coping: Goal: Level of anxiety will decrease Outcome: Progressing   Problem: Elimination: Goal: Will not experience complications related to bowel motility Outcome: Progressing Goal: Will not experience complications related to urinary retention Outcome: Progressing   Problem: Pain Managment: Goal: General experience of comfort will improve and/or be controlled Outcome: Progressing   Problem: Safety: Goal: Ability to remain free from injury will improve Outcome: Progressing   Problem: Skin Integrity: Goal: Risk for impaired skin integrity will decrease Outcome: Progressing   Problem: Education: Goal: Knowledge of General Education information will improve Description: Including pain rating scale, medication(s)/side effects and non-pharmacologic comfort measures Outcome: Progressing   Problem: Health Behavior/Discharge Planning: Goal: Ability to manage health-related needs will improve Outcome: Progressing   Problem: Clinical Measurements: Goal: Ability to maintain clinical measurements within normal limits will improve Outcome: Progressing Goal: Will remain free from infection Outcome: Progressing Goal: Diagnostic test results will improve Outcome: Progressing Goal: Respiratory complications will improve Outcome: Progressing Goal: Cardiovascular complication will be avoided Outcome: Progressing   Problem: Activity: Goal: Risk for activity  intolerance will decrease Outcome: Progressing   Problem: Nutrition: Goal: Adequate nutrition will be maintained Outcome: Progressing   Problem: Coping: Goal: Level of anxiety will decrease Outcome: Progressing   Problem: Elimination: Goal: Will not experience complications related to bowel motility Outcome: Progressing Goal: Will not experience complications related to urinary retention Outcome: Progressing   Problem: Pain Managment: Goal: General experience of comfort will improve and/or be controlled Outcome: Progressing   Problem: Safety: Goal: Ability to remain free from injury will improve Outcome:  Progressing   Problem: Skin Integrity: Goal: Risk for impaired skin integrity will decrease Outcome: Progressing

## 2023-02-09 NOTE — Consult Note (Signed)
[facility] Liaison Consult Note    02/09/2023  Brenda Matthews September 05, 1935 [id number]  Insurance: [employer]   Primary Care Provider: Karna Dupes, MD a provider for facility.  Patient is listed with VBCI with Brenda Matthews at [place] [facility], this provider is listed for the transition of care follow up appointments  and [facility] calls *[facility] Liaison remote coverage review for patient admitted to [facility]       The patient was screened for 30 day readmission hospitalization with noted extreme high risk score for unplanned readmission risk 3 hospital admissions in 6 months.  The patient was assessed for potential Community Care Coordination service needs for post hospital transition for care coordination. Review of patient's electronic medical record reveals patient is being recommended for a SNF level of care.   Plan: [facility] Liaison will continue to follow progress and disposition to asess for post hospital community care coordination needs.  Referral request for community care coordination: pending disposition currently patient needs a higher level of care per MD and inpatient TOC, PT/OT progress notes.   VBCI Community Care, Population Health does not replace or interfere with any arrangements made by the Inpatient Transition of Care team.   For questions contact:   Charlesetta Shanks, RN, BSN, CCM [place]  [facility], [facility] Liaison Direct Dial: [phone] or secure chat Email: [email or web address]

## 2023-02-10 DIAGNOSIS — I480 Paroxysmal atrial fibrillation: Secondary | ICD-10-CM | POA: Diagnosis not present

## 2023-02-10 DIAGNOSIS — I5031 Acute diastolic (congestive) heart failure: Secondary | ICD-10-CM | POA: Diagnosis not present

## 2023-02-10 LAB — BASIC METABOLIC PANEL
Anion gap: 10 (ref 5–15)
BUN: 24 mg/dL — ABNORMAL HIGH (ref 8–23)
CO2: 32 mmol/L (ref 22–32)
Calcium: 9.1 mg/dL (ref 8.9–10.3)
Chloride: 102 mmol/L (ref 98–111)
Creatinine, Ser: 1.35 mg/dL — ABNORMAL HIGH (ref 0.44–1.00)
GFR, Estimated: 38 mL/min — ABNORMAL LOW (ref >60)
Glucose, Bld: 117 mg/dL — ABNORMAL HIGH (ref 70–99)
Potassium: 4.3 mmol/L (ref 3.5–5.1)
Sodium: 144 mmol/L (ref 135–145)

## 2023-02-10 LAB — CBC
HCT: 27.5 % — ABNORMAL LOW (ref 36.0–46.0)
Hemoglobin: 7.9 g/dL — ABNORMAL LOW (ref 12.0–15.0)
MCH: 28 pg (ref 26.0–34.0)
MCHC: 28.7 g/dL — ABNORMAL LOW (ref 30.0–36.0)
MCV: 97.5 fL (ref 80.0–100.0)
Platelets: 181 10*3/uL (ref 150–400)
RBC: 2.82 MIL/uL — ABNORMAL LOW (ref 3.87–5.11)
RDW: 16.5 % — ABNORMAL HIGH (ref 11.5–15.5)
WBC: 7.3 10*3/uL (ref 4.0–10.5)
nRBC: 0 % (ref 0.0–0.2)

## 2023-02-10 LAB — URINE CULTURE: Culture: NO GROWTH

## 2023-02-10 LAB — GLUCOSE, CAPILLARY
Glucose-Capillary: 117 mg/dL — ABNORMAL HIGH (ref 70–99)
Glucose-Capillary: 123 mg/dL — ABNORMAL HIGH (ref 70–99)
Glucose-Capillary: 98 mg/dL (ref 70–99)
Glucose-Capillary: 119 mg/dL — ABNORMAL HIGH (ref 70–99)

## 2023-02-10 LAB — TSH: TSH: 1.588 u[IU]/mL (ref 0.350–4.500)

## 2023-02-10 MED ORDER — TORSEMIDE 20 MG PO TABS
20.0000 mg | ORAL_TABLET | Freq: Two times a day (BID) | ORAL | Status: DC
  Administered 2023-02-10 – 2023-02-12 (×5): 20 mg via ORAL
  Filled 2023-02-10 (×6): qty 1

## 2023-02-10 NOTE — Progress Notes (Signed)
Patient Name: Brenda Matthews Date of Encounter: 02/10/2023 [facility] Cardiologist: Chrystie Nose, MD   Interval Summary  .    Mentation continues to improve. No focal neuro signs Sats still low and volume overloaded   Vital Signs .    Vitals:   02/09/23 1608 02/09/23 2111 02/09/23 2117 02/10/23 0446  BP: (!) 142/78 134/85  125/66  Pulse: (!) 111 (!) 124 (!) 104 94  Resp: 20 20  20   Temp: 97.9 F (36.6 C) 98.5 F (36.9 C)  98.4 F (36.9 C)  TempSrc: Oral Oral  Oral  SpO2: 95% 95%  96%  Weight:      Height:        Intake/Output Summary (Last 24 hours) at 02/10/2023 1001 Last data filed at 02/10/2023 0453 Gross per 24 hour  Intake 1040 ml  Output 600 ml  Net 440 ml      02/09/2023    4:55 AM 02/08/2023    1:43 AM 02/07/2023    9:00 AM  Last 3 Weights  Weight (lbs) 170 lb 3.1 oz 170 lb 3.2 oz 177 lb 0.5 oz  Weight (kg) 77.2 kg 77.202 kg 80.3 kg      Telemetry/ECG    Atrial fibrillation heart rate 110s - Personally Reviewed  CV Studies    Echocardiogram 01/03/2023 1. Left ventricular ejection fraction, by estimation, is 60 to 65%. The  left ventricle has normal function. The left ventricle has no regional  wall motion abnormalities. There is mild concentric left ventricular  hypertrophy. Left ventricular diastolic  parameters are consistent with Grade I diastolic dysfunction (impaired  relaxation).   2. Right ventricular systolic function is normal. The right ventricular  size is normal. There is normal pulmonary artery systolic pressure.   3. Left atrial size was moderately dilated.   4. Right atrial size was moderately dilated.   5. The mitral valve is normal in structure. Trivial mitral valve  regurgitation. No evidence of mitral stenosis.   6. The aortic valve is tricuspid. There is mild calcification of the  aortic valve. Aortic valve regurgitation is mild. No aortic stenosis is  present.   7. Aortic dilatation noted. There is mild  dilatation of the ascending  aorta, measuring 40 mm.   8. The inferior vena cava is normal in size with <50% respiratory  variability, suggesting right atrial pressure of 8 mmHg.    Physical Exam .   GEN: No acute distress.   Neck: no JVD Cardiac: RRR, no murmurs, rubs, or gallops.  Respiratory: + crackles, seems more diffuse today left > right  GI: Soft, nontender, non-distended  Plus 1-2 LE edema  Patient Profile    Brenda Matthews is a 88 y.o. female has hx of chronic HFpEF, PAF, hypertension, hyperlipidemia, CKD.  Admitted for CHF exacerbation and rate control.  Assessment & Plan .     Acute on chronic HFpEF exacerbation Recent admission in December 2024.  Possibly under diuresed or question of Lasix response or not on normal regiment.  Suspect her baseline body weight is closer to 170 pounds based off of previous office notes. She is there and down 7 lbs   Suspect she is euvolemic now.  Body weight is at baseline and kidney function suddenly decreased.  Still remains on supplemental oxygen 1L, but denies any shortness of breath.  Continue to try to wean down and ambulate patient.  PTA was on 20 mg of Lasix, suspect that she had decreased response  with this.  IV lasix stopped Cr improved 1.54-> 1.35 I/O positive on oral lasix 40 mg will change to bid demedex 20 mg  Holding off on restarting SGLT2 inhibitor with confusion, looks like she might have a UTI.  CXR with persistent effusions worse on left   PAF Generally maintains NSR however this admission went into atrial fibrillation.  Asymptomatic.   Rates are better controlled in the 110s with increased dose of Lopressor  Continue reduced dose of Eliquis 2.5 mg twice daily for renal function and age, continue Lopressor 50 mg twice daily.  She has significant anemia with no obvious etiology.  Will continue DOAC for now but need to closely monitor this.  Further workup per primary team Continue amiodarone 200 mg daily Of note:  Eliquis was stopped 01/21-1/22 due to lack of p.o. intake.  Will need at least 3 weeks of anticoagulation before considering possible DCCV again.   CKD Improving with diuresis.  CR 1.35    Anemia She has had a precipitous drop since December when it was normal.  Hemoglobin here Stable now 7.9   No obvious signs of bleeding.  Defer to primary team about workup.  AMS Improved last 48 hours  CT negative  Delirium   Will need to go to [facility] for rehab and not straight home   For questions or updates, please contact [facility] Please consult [email or web address] for contact info under        Signed, Charlton Haws, MD

## 2023-02-10 NOTE — Progress Notes (Signed)
Physical Therapy Treatment Patient Details Name: Brenda Matthews MRN: [id number] DOB: Feb 22, 1935 Today's Date: 02/10/2023   History of Present Illness Brenda Matthews is a 88 y.o. female admitted 02/04/23 for acute diastolic congestive heart failure. Of note, pt recently hospitalized 01/02/23 - 01/14/23 for right lower extremity cellulitis and heart failure decompensation. PMH: chronic renal insufficiency, chronic anemia, non-insulin-dependent diabetes, hyperlipidemia, HTN, afib, CHF, osteopenia    PT Comments  General Comments: AxO x 2 improving orientation.  Admits she does not recall how she got here. Pleasant.  Following all commands.  Pt sharred she lives at [facility] about 7 years.  Mild anxiety/fear of falling pt stated "I can't walk far". Assisted OOB to amb was difficult.  General bed mobility comments: Pt required Mod assist and increased time to complete scooting to EOB.  Fatigues easily. General transfer comment: pt required Mod Assist + 2 to perform sit to stand with VC's on proper hand placement and forward lean.  Unsteady.  Fearfull. General Gait Details: Very limited amb distance of 8 feet requiring + 2 Mod Assist and recliner to follow.  Very short shuffled steps.  Unsteady.  Pt required 3 lts to achieve sats > 90%.  HR ranged from 109 to 121.  Max c/o weakness/fatigue and 2/4 dyspnea. Prior pt was Indep and amb with a Rollator.  Pt will need ST Rehab at SNF to address mobility and functional decline prior to safely returning home.    If plan is discharge home, recommend the following: A lot of help with walking and/or transfers;A lot of help with bathing/dressing/bathroom;Assistance with cooking/housework;Assist for transportation   Can travel by private vehicle     No  Equipment Recommendations  None recommended by PT    Recommendations for Other Services       Precautions / Restrictions Precautions Precautions: Fall Precaution Comments: monitor  vitals Restrictions Weight Bearing Restrictions Per Provider Order: No     Mobility  Bed Mobility Overal bed mobility: Needs Assistance Bed Mobility: Supine to Sit     Supine to sit: Mod assist     General bed mobility comments: Pt required Mod assist and increased time to complete scooting to EOB.  Fatigues easily.    Transfers Overall transfer level: Needs assistance Equipment used: Rolling walker (2 wheels) Transfers: Sit to/from Stand Sit to Stand: Mod assist, +2 safety/equipment           General transfer comment: pt required Mod Assist + 2 to perform sit to stand with VC's on proper hand placement and forward lean.  Unsteady.  Fearfull.    Ambulation/Gait Ambulation/Gait assistance: Min assist, Mod assist, +2 physical assistance, +2 safety/equipment Gait Distance (Feet): 8 Feet Assistive device: Rolling walker (2 wheels) Gait Pattern/deviations: Step-through pattern, Decreased stride length, Trunk flexed Gait velocity: decreased     General Gait Details: Very limited amb distance of 8 feet requiring + 2 Mod Assist and recliner to follow.  Very short shuffled steps.  Unsteady.  Pt required 3 lts to achieve sats > 90%.  HR ranged from 109 to 121.  Max c/o weakness/fatigue and 2/4 dyspnea.   Stairs             Wheelchair Mobility     Tilt Bed    Modified Rankin (Stroke Patients Only)       Balance  Cognition Arousal: Alert Behavior During Therapy: WFL for tasks assessed/performed                                   General Comments: AxO x 2 improving orientation.  Admits she does not recall how she got here. Pleasant.  Following all commands.  Pt sharred she lives at [facility] about 7 years.  Mild anxiety/fear of falling pt stated "I can't walk far".        Exercises      General Comments        Pertinent Vitals/Pain Pain Assessment Pain Assessment:  No/denies pain    Home Living                          Prior Function            PT Goals (current goals can now be found in the care plan section) Progress towards PT goals: Progressing toward goals    Frequency    Min 1X/week      PT Plan      Co-evaluation              AM-PAC PT "6 Clicks" Mobility   Outcome Measure  Help needed turning from your back to your side while in a flat bed without using bedrails?: A Lot Help needed moving from lying on your back to sitting on the side of a flat bed without using bedrails?: A Lot Help needed moving to and from a bed to a chair (including a wheelchair)?: A Lot Help needed standing up from a chair using your arms (e.g., wheelchair or bedside chair)?: A Lot Help needed to walk in hospital room?: A Lot Help needed climbing 3-5 steps with a railing? : Total 6 Click Score: 11    End of Session Equipment Utilized During Treatment: Gait belt;Oxygen Activity Tolerance: Patient limited by fatigue Patient left: in chair;with call bell/phone within reach;with chair alarm set Nurse Communication: Mobility status PT Visit Diagnosis: Unsteadiness on feet (R26.81);Muscle weakness (generalized) (M62.81)     Time: 1610-9604 PT Time Calculation (min) (ACUTE ONLY): 28 min  Charges:    $Gait Training: 8-22 mins $Therapeutic Activity: 8-22 mins PT General Charges $$ ACUTE PT VISIT: 1 Visit                     {Jarvin Ogren  PTA Acute  Rehabilitation Services Office M-F          [phone]

## 2023-02-10 NOTE — Plan of Care (Addendum)
Patient alert, oriented to self sometimes place. Otherwise pleasantly confused. Up to chair for meals and BSC. No reports of pain, remains on 1L [place] and lung sounds still wheezy, with PRN treatments available. Foley care provided per protocol.   Problem: Education: Goal: Ability to describe self-care measures that may prevent or decrease complications (Diabetes Survival Skills Education) will improve Outcome: Progressing Goal: Individualized Educational Video(s) Outcome: Progressing   Problem: Coping: Goal: Ability to adjust to condition or change in health will improve Outcome: Progressing   Problem: Fluid Volume: Goal: Ability to maintain a balanced intake and output will improve Outcome: Progressing   Problem: Health Behavior/Discharge Planning: Goal: Ability to identify and utilize available resources and services will improve Outcome: Progressing Goal: Ability to manage health-related needs will improve Outcome: Progressing   Problem: Metabolic: Goal: Ability to maintain appropriate glucose levels will improve Outcome: Progressing   Problem: Nutritional: Goal: Maintenance of adequate nutrition will improve Outcome: Progressing Goal: Progress toward achieving an optimal weight will improve Outcome: Progressing   Problem: Skin Integrity: Goal: Risk for impaired skin integrity will decrease Outcome: Progressing   Problem: Tissue Perfusion: Goal: Adequacy of tissue perfusion will improve Outcome: Progressing   Problem: Education: Goal: Knowledge of General Education information will improve Description: Including pain rating scale, medication(s)/side effects and non-pharmacologic comfort measures Outcome: Progressing   Problem: Health Behavior/Discharge Planning: Goal: Ability to manage health-related needs will improve Outcome: Progressing   Problem: Clinical Measurements: Goal: Ability to maintain clinical measurements within normal limits will improve Outcome:  Progressing Goal: Will remain free from infection Outcome: Progressing Goal: Diagnostic test results will improve Outcome: Progressing Goal: Respiratory complications will improve Outcome: Progressing Goal: Cardiovascular complication will be avoided Outcome: Progressing   Problem: Activity: Goal: Risk for activity intolerance will decrease Outcome: Progressing   Problem: Nutrition: Goal: Adequate nutrition will be maintained Outcome: Progressing   Problem: Coping: Goal: Level of anxiety will decrease Outcome: Progressing   Problem: Elimination: Goal: Will not experience complications related to bowel motility Outcome: Progressing Goal: Will not experience complications related to urinary retention Outcome: Progressing   Problem: Pain Managment: Goal: General experience of comfort will improve and/or be controlled Outcome: Progressing   Problem: Safety: Goal: Ability to remain free from injury will improve Outcome: Progressing   Problem: Skin Integrity: Goal: Risk for impaired skin integrity will decrease Outcome: Progressing   Problem: Education: Goal: Knowledge of General Education information will improve Description: Including pain rating scale, medication(s)/side effects and non-pharmacologic comfort measures Outcome: Progressing   Problem: Health Behavior/Discharge Planning: Goal: Ability to manage health-related needs will improve Outcome: Progressing   Problem: Clinical Measurements: Goal: Ability to maintain clinical measurements within normal limits will improve Outcome: Progressing Goal: Will remain free from infection Outcome: Progressing Goal: Diagnostic test results will improve Outcome: Progressing Goal: Respiratory complications will improve Outcome: Progressing Goal: Cardiovascular complication will be avoided Outcome: Progressing   Problem: Activity: Goal: Risk for activity intolerance will decrease Outcome: Progressing   Problem:  Nutrition: Goal: Adequate nutrition will be maintained Outcome: Progressing   Problem: Coping: Goal: Level of anxiety will decrease Outcome: Progressing   Problem: Elimination: Goal: Will not experience complications related to bowel motility Outcome: Progressing Goal: Will not experience complications related to urinary retention Outcome: Progressing   Problem: Pain Managment: Goal: General experience of comfort will improve and/or be controlled Outcome: Progressing   Problem: Safety: Goal: Ability to remain free from injury will improve Outcome: Progressing   Problem: Skin Integrity: Goal: Risk for impaired skin  integrity will decrease Outcome: Progressing

## 2023-02-10 NOTE — Progress Notes (Signed)
PROGRESS NOTE    Brenda Matthews  ZOX:[id number] DOB: 02/23/1935 DOA: 02/04/2023 PCP: Karna Dupes, MD  Chief Complaint  Patient presents with   Respiratory Distress    Brief Narrative:   88 year old female with atrial fibrillation, diastolic heart failure who presents to the hospital with shortness of breath, orthopnea and pedal edema up to her thighs from her nursing facility. She presented on Jaylea Plourde CPAP and was transition to Adeena Bernabe BiPAP. Eventually was transitioned down to 4 L of oxygen.   Treated for HF exacerbation.  Hospitalization complicated by delirium and RVR.  Cards following.   Assessment & Plan:   Principal Problem:   Acute diastolic (congestive) heart failure (HCC) Active Problems:   Atrial fibrillation and flutter (HCC)  Heart Failure with Preserved Ejection Fraction  Acute Diastolic Heart Failure She has dependent edema to hips, still with O2 requirement CXR 1/18 with findings c/w congestive HF (+ bibasilar atelectasis or consolidation) Transitioned to PO lasix per cardiology -> now on torsemide Jardiance currently on hold  Strict I/O, daily weights  Atrial fibrillation/flutter with RVR Amiodarone, metoprolol, eliquis Will defer rate control to cardiology  Acute Metabolic Encephalopathy Delirium  Head CT limited, but no acute intracranial process noted She's improving, but not to her baseline - suspect this was hospital acquired delirium Gradual improvement Delirium precautions  Tremor Resting tremor, L>R, she notes this is new Neurology follow up would be warranted Of note, amiodarone associated with enhanced physiologic tremor - I don't see other meds that could cause issue  CKD IIIb Monitor on diuretics Creatinine better than recent baseline  Anemia Appears c/w AOCD, elevated b12, normal folate.  Normal iron/ferritin.  No signs of bleeding. Will monitor, transfuse for <7.  Hb drop noted in December.   Hypertension Hydralazine on hold Continue  metop  Dyslipidemia Lipitor  Neuropathic Pain Gabapentin    DVT prophylaxis: eliquis Code Status: full Family Communication: noen Disposition:   Status is: Inpatient Remains inpatient appropriate because: need for continued inpatient care   Consultants:  cards  Procedures:  none  Antimicrobials:  Anti-infectives (From admission, onward)    None       Subjective: No new complaints  Objective: Vitals:   02/09/23 2111 02/09/23 2117 02/10/23 0446 02/10/23 1325  BP: 134/85  125/66 128/75  Pulse: (!) 124 (!) 104 94 (!) 101  Resp: 20  20 20   Temp: 98.5 F (36.9 C)  98.4 F (36.9 C) 97.8 F (36.6 C)  TempSrc: Oral  Oral Oral  SpO2: 95%  96% 100%  Weight:      Height:        Intake/Output Summary (Last 24 hours) at 02/10/2023 1705 Last data filed at 02/10/2023 1545 Gross per 24 hour  Intake 1200 ml  Output 1275 ml  Net -75 ml   Filed Weights   02/07/23 0900 02/08/23 0143 02/09/23 0455  Weight: 80.3 kg 77.2 kg 77.2 kg    Examination:  General: No acute distress. Cardiovascular: tachy, irregularly irregular  Lungs: diminished Neurological: Alert and oriented 3 (needed time and to self correct - wasn't able to get exact location - ie [place], but knew "[facility]"). Moves all extremities 4 with equal strength. Cranial nerves II through XII grossly intact. Extremities: dependent edema   Data Reviewed: I have personally reviewed following labs and imaging studies  CBC: Recent Labs  Lab 02/04/23 0550 02/05/23 0530 02/07/23 0457 02/08/23 0453 02/09/23 0438 02/10/23 0732  WBC 10.4 9.2 8.3 7.7 8.1 7.3  NEUTROABS  7.9*  --   --   --   --   --   HGB 7.6* 7.5* 7.6* 7.3* 8.3* 7.9*  HCT 25.9* 26.5* 25.3* 24.6* 29.3* 27.5*  MCV 98.5 99.3 95.5 95.3 98.7 97.5  PLT 173 167 197 167 180 181    Basic Metabolic Panel: Recent Labs  Lab 02/05/23 0530 02/07/23 0457 02/08/23 0453 02/09/23 0438 02/10/23 0732  NA 141 144 144 145 144  K 4.0 3.6 3.6  3.9 4.3  CL 107 106 101 100 102  CO2 29 29 35* 35* 32  GLUCOSE 97 93 86 103* 117*  BUN 29* 32* 26* 26* 24*  CREATININE 1.55* 1.39* 1.16* 1.54* 1.35*  CALCIUM 8.9 8.5* 8.8* 9.2 9.1  MG  --   --   --  2.3  --   PHOS  --   --   --  3.5  --     GFR: Estimated Creatinine Clearance: 28.1 mL/min (Jehiel Koepp) (by C-G formula based on SCr of 1.35 mg/dL (H)).  Liver Function Tests: Recent Labs  Lab 02/04/23 0550  AST 28  ALT 21  ALKPHOS 84  BILITOT 0.7  PROT 6.0*  ALBUMIN 2.7*    CBG: Recent Labs  Lab 02/09/23 1054 02/09/23 1615 02/09/23 2108 02/10/23 0824 02/10/23 1121  GLUCAP 147* 99 112* 117* 123*     Recent Results (from the past 240 hours)  Culture, blood (Routine x 2)     Status: None   Collection Time: 02/04/23  5:52 AM   Specimen: BLOOD RIGHT ARM  Result Value Ref Range Status   Specimen Description   Final    BLOOD RIGHT ARM Performed at [facility], [address] [place], [place] [address]    Special Requests   Final    BOTTLES DRAWN AEROBIC AND ANAEROBIC Blood Culture results may not be optimal due to an inadequate volume of blood received in culture bottles Performed at [facility], [address], [place], [place] [address]    Culture   Final    NO GROWTH 5 DAYS Performed at [facility], [address] [place], [place] [address]    Report Status 02/09/2023 FINAL  Final  Culture, blood (Routine x 2)     Status: None   Collection Time: 02/04/23  6:57 AM   Specimen: BLOOD RIGHT HAND  Result Value Ref Range Status   Specimen Description   Final    BLOOD RIGHT HAND Performed at [facility], [address], [place], [place] [address]    Special Requests   Final    BOTTLES DRAWN AEROBIC ONLY Blood Culture results may not be optimal due to an inadequate volume of blood received in culture bottles Performed at [facility], [address], [place], [place] [address]    Culture   Final    NO GROWTH 5  DAYS Performed at [facility], [address], [place], [place] [address]    Report Status 02/09/2023 FINAL  Final  Resp panel by RT-PCR (RSV, Flu Harinder Romas&B, Covid) Anterior Nasal Swab     Status: None   Collection Time: 02/04/23  8:02 AM   Specimen: Anterior Nasal Swab  Result Value Ref Range Status   SARS Coronavirus 2 by RT PCR NEGATIVE NEGATIVE Final    Comment: (NOTE) SARS-CoV-2 target nucleic acids are NOT DETECTED.  The SARS-CoV-2 RNA is generally detectable in upper respiratory specimens during the acute phase of infection. The  lowest concentration of SARS-CoV-2 viral copies this assay can detect is 138 copies/mL. Etienne Millward negative result does not preclude SARS-Cov-2 infection and should not be used as the sole basis for treatment or other patient management decisions. Jacinta Penalver negative result may occur with  improper specimen collection/handling, submission of specimen other than nasopharyngeal swab, presence of viral mutation(s) within the areas targeted by this assay, and inadequate number of viral copies(<138 copies/mL). Anselm Aumiller negative result must be combined with clinical observations, patient history, and epidemiological information. The expected result is Negative.  Fact Sheet for Patients:  BloggerCourse.com  Fact Sheet for Healthcare Providers:  SeriousBroker.it  This test is no t yet approved or cleared by the [place] FDA and  has been authorized for detection and/or diagnosis of SARS-CoV-2 by FDA under an Emergency Use Authorization (EUA). This EUA will remain  in effect (meaning this test can be used) for the duration of the COVID-19 declaration under Section 564(b)(1) of the Act, 21 U.S.C.section 360bbb-3(b)(1), unless the authorization is terminated  or revoked sooner.       Influenza Sabriah Hobbins by PCR NEGATIVE NEGATIVE Final   Influenza B by PCR NEGATIVE NEGATIVE Final    Comment: (NOTE) The Xpert Xpress  SARS-CoV-2/FLU/RSV plus assay is intended as an aid in the diagnosis of influenza from Nasopharyngeal swab specimens and should not be used as Arsenia Goracke sole basis for treatment. Nasal washings and aspirates are unacceptable for Xpert Xpress SARS-CoV-2/FLU/RSV testing.  Fact Sheet for Patients: BloggerCourse.com  Fact Sheet for Healthcare Providers: SeriousBroker.it  This test is not yet approved or cleared by the [place] FDA and has been authorized for detection and/or diagnosis of SARS-CoV-2 by FDA under an Emergency Use Authorization (EUA). This EUA will remain in effect (meaning this test can be used) for the duration of the COVID-19 declaration under Section 564(b)(1) of the Act, 21 U.S.C. section 360bbb-3(b)(1), unless the authorization is terminated or revoked.     Resp Syncytial Virus by PCR NEGATIVE NEGATIVE Final    Comment: (NOTE) Fact Sheet for Patients: BloggerCourse.com  Fact Sheet for Healthcare Providers: SeriousBroker.it  This test is not yet approved or cleared by the [place] FDA and has been authorized for detection and/or diagnosis of SARS-CoV-2 by FDA under an Emergency Use Authorization (EUA). This EUA will remain in effect (meaning this test can be used) for the duration of the COVID-19 declaration under Section 564(b)(1) of the Act, 21 U.S.C. section 360bbb-3(b)(1), unless the authorization is terminated or revoked.  Performed at [facility], [address] [place], [place] [address]   Urine Culture (for pregnant, neutropenic or urologic patients or patients with an indwelling urinary catheter)     Status: None   Collection Time: 02/09/23  4:21 PM   Specimen: Urine, Clean Catch  Result Value Ref Range Status   Specimen Description   Final    URINE, CLEAN CATCH Performed at [facility], [address],  [place], [place] [address]    Special Requests   Final    NONE Performed at [facility], [address] [place], [place] [address]    Culture   Final    NO GROWTH Performed at [facility], [address] [place], [place] [address]    Report Status 02/10/2023 FINAL  Final         Radiology Studies: DG Chest 2 View Result Date: 02/09/2023 CLINICAL DATA:  88 year old female "pneumonia". EXAM: CHEST - 2 VIEW COMPARISON:  Portable  chest 02/04/2023 and earlier. FINDINGS: AP and lateral views at 0909 hours. Large lung volumes. Decreased but not resolved bilateral layering pleural effusions, now small and still larger on the left. Evidence of underlying cardiomegaly. Calcified aortic atherosclerosis. No pneumothorax or pulmonary edema. Pulmonary vascularity appears to be at baseline, no overt edema. No pneumothorax. Visualized tracheal air column is within normal limits. Osteopenia. No acute osseous abnormality identified. Paucity of bowel gas. IMPRESSION: 1. Decreased but not resolved bilateral layering pleural effusions, still larger on the left. 2. No other acute cardiopulmonary abnormality. Cardiomegaly. Aortic Atherosclerosis (ICD10-I70.0). Electronically Signed   By: Odessa Fleming M.D.   On: 02/09/2023 09:19        Scheduled Meds:  amiodarone  200 mg Oral Daily   apixaban  2.5 mg Oral BID   atorvastatin  40 mg Oral q1800   calcium carbonate  1 tablet Oral Q breakfast   Chlorhexidine Gluconate Cloth  6 each Topical Daily   fluticasone  2 spray Each Nare Daily   insulin aspart  0-15 Units Subcutaneous TID WC   insulin aspart  0-5 Units Subcutaneous QHS   metoprolol tartrate  50 mg Oral BID   polyethylene glycol  17 g Oral BID   potassium chloride  30 mEq Oral Daily   torsemide  20 mg Oral BID   Continuous Infusions:     LOS: 6 days    Time spent: over 30 min     Lacretia Nicks, MD [facility]   To contact the attending provider between 7A-7P  or the covering provider during after hours 7P-7A, please log into the web site [email or web address] and access using universal [place] password for that web site. If you do not have the password, please call the hospital operator.  02/10/2023, 5:05 PM

## 2023-02-11 DIAGNOSIS — I5031 Acute diastolic (congestive) heart failure: Secondary | ICD-10-CM | POA: Diagnosis not present

## 2023-02-11 LAB — CBC WITH DIFFERENTIAL/PLATELET
Abs Immature Granulocytes: 0.02 10*3/uL (ref 0.00–0.07)
Basophils Absolute: 0 10*3/uL (ref 0.0–0.1)
Basophils Relative: 1 %
Eosinophils Absolute: 0.2 10*3/uL (ref 0.0–0.5)
Eosinophils Relative: 2 %
HCT: 27.6 % — ABNORMAL LOW (ref 36.0–46.0)
Hemoglobin: 8.1 g/dL — ABNORMAL LOW (ref 12.0–15.0)
Immature Granulocytes: 0 %
Lymphocytes Relative: 15 %
Lymphs Abs: 1.2 10*3/uL (ref 0.7–4.0)
MCH: 28.5 pg (ref 26.0–34.0)
MCHC: 29.3 g/dL — ABNORMAL LOW (ref 30.0–36.0)
MCV: 97.2 fL (ref 80.0–100.0)
Monocytes Absolute: 0.6 10*3/uL (ref 0.1–1.0)
Monocytes Relative: 8 %
Neutro Abs: 5.5 10*3/uL (ref 1.7–7.7)
Neutrophils Relative %: 74 %
Platelets: 191 10*3/uL (ref 150–400)
RBC: 2.84 MIL/uL — ABNORMAL LOW (ref 3.87–5.11)
RDW: 16.5 % — ABNORMAL HIGH (ref 11.5–15.5)
WBC: 7.5 10*3/uL (ref 4.0–10.5)
nRBC: 0 % (ref 0.0–0.2)

## 2023-02-11 LAB — COMPREHENSIVE METABOLIC PANEL
ALT: 20 U/L (ref 0–44)
AST: 26 U/L (ref 15–41)
Albumin: 2.6 g/dL — ABNORMAL LOW (ref 3.5–5.0)
Alkaline Phosphatase: 71 U/L (ref 38–126)
Anion gap: 10 (ref 5–15)
BUN: 22 mg/dL (ref 8–23)
CO2: 36 mmol/L — ABNORMAL HIGH (ref 22–32)
Calcium: 9.2 mg/dL (ref 8.9–10.3)
Chloride: 100 mmol/L (ref 98–111)
Creatinine, Ser: 1.45 mg/dL — ABNORMAL HIGH (ref 0.44–1.00)
GFR, Estimated: 35 mL/min — ABNORMAL LOW (ref >60)
Glucose, Bld: 99 mg/dL (ref 70–99)
Potassium: 3.8 mmol/L (ref 3.5–5.1)
Sodium: 146 mmol/L — ABNORMAL HIGH (ref 135–145)
Total Bilirubin: 1.1 mg/dL (ref 0.0–1.2)
Total Protein: 5.8 g/dL — ABNORMAL LOW (ref 6.5–8.1)

## 2023-02-11 LAB — GLUCOSE, CAPILLARY
Glucose-Capillary: 104 mg/dL — ABNORMAL HIGH (ref 70–99)
Glucose-Capillary: 94 mg/dL (ref 70–99)
Glucose-Capillary: 139 mg/dL — ABNORMAL HIGH (ref 70–99)
Glucose-Capillary: 85 mg/dL (ref 70–99)

## 2023-02-11 LAB — PHOSPHORUS: Phosphorus: 3.5 mg/dL (ref 2.5–4.6)

## 2023-02-11 LAB — MAGNESIUM: Magnesium: 2.4 mg/dL (ref 1.7–2.4)

## 2023-02-11 NOTE — Progress Notes (Signed)
PROGRESS NOTE    Brenda Matthews  JWJ:[id number] DOB: 1935-08-13 DOA: 02/04/2023 PCP: Karna Dupes, MD  Chief Complaint  Patient presents with   Respiratory Distress    Brief Narrative:   88 year old female with atrial fibrillation, diastolic heart failure who presents to the hospital with shortness of breath, orthopnea and pedal edema up to her thighs from her nursing facility. She presented on Nyasia Baxley CPAP and was transition to Sacha Radloff BiPAP. Eventually was transitioned down to 4 L of oxygen.   Treated for HF exacerbation.  Hospitalization complicated by delirium and RVR.  Cards following.   Assessment & Plan:   Principal Problem:   Acute diastolic (congestive) heart failure (HCC) Active Problems:   Atrial fibrillation and flutter (HCC)  Acute Hypoxic Respiratory Failure Heart Failure with Preserved Ejection Fraction  Acute Diastolic Heart Failure She has dependent edema to hips, still with O2 requirement CXR 1/18 with findings c/w congestive HF (+ bibasilar atelectasis or consolidation) Transitioned to PO lasix per cardiology -> now on torsemide Jardiance currently on hold  Strict I/O, daily weights  Atrial fibrillation/flutter with RVR Amiodarone, metoprolol, eliquis Will defer rate control to cardiology  Acute Metabolic Encephalopathy Delirium  Head CT limited, but no acute intracranial process noted She's improving, but not to her baseline - suspect this was hospital acquired delirium Gradual improvement She'll need neurology follow up outpatient Delirium precautions  Tremor Resting tremor, L>R, she notes this is new TSH wnl Neurology follow up would be warranted Of note, amiodarone associated with enhanced physiologic tremor - I don't see other meds that could cause issue  CKD IIIb Monitor on diuretics Creatinine better than recent baseline  Anemia Appears c/w AOCD, elevated b12, normal folate.  Normal iron/ferritin.  No signs of bleeding. Will monitor, transfuse  for <7.  Hb drop noted in December.   Hypertension Hydralazine on hold Continue metop  Dyslipidemia Lipitor  Neuropathic Pain Gabapentin  Obesity Body mass index is 32.16 kg/m.    DVT prophylaxis: eliquis Code Status: full Family Communication: noen Disposition:   Status is: Inpatient Remains inpatient appropriate because: need for continued inpatient care   Consultants:  cards  Procedures:  none  Antimicrobials:  Anti-infectives (From admission, onward)    None       Subjective: No new complaints  Objective: Vitals:   02/10/23 1325 02/10/23 2107 02/11/23 0232 02/11/23 0620  BP: 128/75 (!) 138/95  128/82  Pulse: (!) 101     Resp: 20 20  20   Temp: 97.8 F (36.6 C) 97.8 F (36.6 C)  97.9 F (36.6 C)  TempSrc: Oral Oral  Oral  SpO2: 100% 99% 96% 96%  Weight:      Height:        Intake/Output Summary (Last 24 hours) at 02/11/2023 0840 Last data filed at 02/11/2023 0647 Gross per 24 hour  Intake 720 ml  Output 1925 ml  Net -1205 ml   Filed Weights   02/07/23 0900 02/08/23 0143 02/09/23 0455  Weight: 80.3 kg 77.2 kg 77.2 kg    Examination:  General: No acute distress. Cardiovascular: irregularly irregular - mildly tachy Lungs: faint basilar crackles, diminished - unlabored Neurological: Alert and oriented 3. Moves all extremities 4 with equal strength. Cranial nerves II through XII grossly intact.  Resting tremor.  Extremities: dependent edema to thighs  Data Reviewed: I have personally reviewed following labs and imaging studies  CBC: Recent Labs  Lab 02/07/23 0457 02/08/23 0453 02/09/23 0438 02/10/23 0732 02/11/23 0542  WBC 8.3  7.7 8.1 7.3 7.5  NEUTROABS  --   --   --   --  5.5  HGB 7.6* 7.3* 8.3* 7.9* 8.1*  HCT 25.3* 24.6* 29.3* 27.5* 27.6*  MCV 95.5 95.3 98.7 97.5 97.2  PLT 197 167 180 181 191    Basic Metabolic Panel: Recent Labs  Lab 02/05/23 0530 02/07/23 0457 02/08/23 0453 02/09/23 0438 02/10/23 0732  NA 141  144 144 145 144  K 4.0 3.6 3.6 3.9 4.3  CL 107 106 101 100 102  CO2 29 29 35* 35* 32  GLUCOSE 97 93 86 103* 117*  BUN 29* 32* 26* 26* 24*  CREATININE 1.55* 1.39* 1.16* 1.54* 1.35*  CALCIUM 8.9 8.5* 8.8* 9.2 9.1  MG  --   --   --  2.3  --   PHOS  --   --   --  3.5  --     GFR: Estimated Creatinine Clearance: 28.1 mL/min (Hai Grabe) (by C-G formula based on SCr of 1.35 mg/dL (H)).  Liver Function Tests: No results for input(s): "AST", "ALT", "ALKPHOS", "BILITOT", "PROT", "ALBUMIN" in the last 168 hours.   CBG: Recent Labs  Lab 02/10/23 0824 02/10/23 1121 02/10/23 1705 02/10/23 2107 02/11/23 0726  GLUCAP 117* 123* 98 119* 104*     Recent Results (from the past 240 hours)  Culture, blood (Routine x 2)     Status: None   Collection Time: 02/04/23  5:52 AM   Specimen: BLOOD RIGHT ARM  Result Value Ref Range Status   Specimen Description   Final    BLOOD RIGHT ARM Performed at [facility], [address] [place], [place] [address]    Special Requests   Final    BOTTLES DRAWN AEROBIC AND ANAEROBIC Blood Culture results may not be optimal due to an inadequate volume of blood received in culture bottles Performed at [facility], [address], [place], [place] [address]    Culture   Final    NO GROWTH 5 DAYS Performed at [facility], [address] [place], [place] [address]    Report Status 02/09/2023 FINAL  Final  Culture, blood (Routine x 2)     Status: None   Collection Time: 02/04/23  6:57 AM   Specimen: BLOOD RIGHT HAND  Result Value Ref Range Status   Specimen Description   Final    BLOOD RIGHT HAND Performed at [facility], [address], [place], [place] [address]    Special Requests   Final    BOTTLES DRAWN AEROBIC ONLY Blood Culture results may not be optimal due to an inadequate volume of blood received in culture bottles Performed at [facility], [address], [place], [place] [address]     Culture   Final    NO GROWTH 5 DAYS Performed at [facility], [address], [place], [place] [address]    Report Status 02/09/2023 FINAL  Final  Resp panel by RT-PCR (RSV, Flu Jurell Basista&B, Covid) Anterior Nasal Swab     Status: None   Collection Time: 02/04/23  8:02 AM   Specimen: Anterior Nasal Swab  Result Value Ref Range Status   SARS Coronavirus 2 by RT PCR NEGATIVE NEGATIVE Final    Comment: (NOTE) SARS-CoV-2 target nucleic acids are NOT DETECTED.  The SARS-CoV-2 RNA is generally detectable in upper respiratory specimens during the acute phase of infection. The lowest concentration of SARS-CoV-2 viral copies this assay can detect is  138 copies/mL. Aaronjames Kelsay negative result does not preclude SARS-Cov-2 infection and should not be used as the sole basis for treatment or other patient management decisions. Maddelynn Moosman negative result may occur with  improper specimen collection/handling, submission of specimen other than nasopharyngeal swab, presence of viral mutation(s) within the areas targeted by this assay, and inadequate number of viral copies(<138 copies/mL). Noma Quijas negative result must be combined with clinical observations, patient history, and epidemiological information. The expected result is Negative.  Fact Sheet for Patients:  BloggerCourse.com  Fact Sheet for Healthcare Providers:  SeriousBroker.it  This test is no t yet approved or cleared by the [place] FDA and  has been authorized for detection and/or diagnosis of SARS-CoV-2 by FDA under an Emergency Use Authorization (EUA). This EUA will remain  in effect (meaning this test can be used) for the duration of the COVID-19 declaration under Section 564(b)(1) of the Act, 21 U.S.C.section 360bbb-3(b)(1), unless the authorization is terminated  or revoked sooner.       Influenza Modelle Vollmer by PCR NEGATIVE NEGATIVE Final   Influenza B by PCR NEGATIVE NEGATIVE Final    Comment:  (NOTE) The Xpert Xpress SARS-CoV-2/FLU/RSV plus assay is intended as an aid in the diagnosis of influenza from Nasopharyngeal swab specimens and should not be used as Nilan Iddings sole basis for treatment. Nasal washings and aspirates are unacceptable for Xpert Xpress SARS-CoV-2/FLU/RSV testing.  Fact Sheet for Patients: BloggerCourse.com  Fact Sheet for Healthcare Providers: SeriousBroker.it  This test is not yet approved or cleared by the [place] FDA and has been authorized for detection and/or diagnosis of SARS-CoV-2 by FDA under an Emergency Use Authorization (EUA). This EUA will remain in effect (meaning this test can be used) for the duration of the COVID-19 declaration under Section 564(b)(1) of the Act, 21 U.S.C. section 360bbb-3(b)(1), unless the authorization is terminated or revoked.     Resp Syncytial Virus by PCR NEGATIVE NEGATIVE Final    Comment: (NOTE) Fact Sheet for Patients: BloggerCourse.com  Fact Sheet for Healthcare Providers: SeriousBroker.it  This test is not yet approved or cleared by the [place] FDA and has been authorized for detection and/or diagnosis of SARS-CoV-2 by FDA under an Emergency Use Authorization (EUA). This EUA will remain in effect (meaning this test can be used) for the duration of the COVID-19 declaration under Section 564(b)(1) of the Act, 21 U.S.C. section 360bbb-3(b)(1), unless the authorization is terminated or revoked.  Performed at [facility], [address], [place], [place] [address]   Urine Culture (for pregnant, neutropenic or urologic patients or patients with an indwelling urinary catheter)     Status: None   Collection Time: 02/09/23  4:21 PM   Specimen: Urine, Clean Catch  Result Value Ref Range Status   Specimen Description   Final    URINE, CLEAN CATCH Performed at [facility], [address] [place], [place] [address]    Special Requests   Final    NONE Performed at [facility], [address], [place], [place] [address]    Culture   Final    NO GROWTH Performed at [facility], [address], [place], [place] [address]    Report Status 02/10/2023 FINAL  Final         Radiology Studies: DG Chest 2 View Result Date: 02/09/2023 CLINICAL DATA:  88 year old female "pneumonia". EXAM: CHEST - 2 VIEW COMPARISON:  Portable chest 02/04/2023 and earlier. FINDINGS: AP and lateral views at [phone]  hours. Large lung volumes. Decreased but not resolved bilateral layering pleural effusions, now small and still larger on the left. Evidence of underlying cardiomegaly. Calcified aortic atherosclerosis. No pneumothorax or pulmonary edema. Pulmonary vascularity appears to be at baseline, no overt edema. No pneumothorax. Visualized tracheal air column is within normal limits. Osteopenia. No acute osseous abnormality identified. Paucity of bowel gas. IMPRESSION: 1. Decreased but not resolved bilateral layering pleural effusions, still larger on the left. 2. No other acute cardiopulmonary abnormality. Cardiomegaly. Aortic Atherosclerosis (ICD10-I70.0). Electronically Signed   By: Odessa Fleming M.D.   On: 02/09/2023 09:19        Scheduled Meds:  amiodarone  200 mg Oral Daily   apixaban  2.5 mg Oral BID   atorvastatin  40 mg Oral q1800   calcium carbonate  1 tablet Oral Q breakfast   Chlorhexidine Gluconate Cloth  6 each Topical Daily   fluticasone  2 spray Each Nare Daily   insulin aspart  0-15 Units Subcutaneous TID WC   insulin aspart  0-5 Units Subcutaneous QHS   metoprolol tartrate  50 mg Oral BID   polyethylene glycol  17 g Oral BID   potassium chloride  30 mEq Oral Daily   torsemide  20 mg Oral BID   Continuous Infusions:     LOS: 7 days    Time spent: over 30 min     Lacretia Nicks, MD [facility]   To contact the  attending provider between 7A-7P or the covering provider during after hours 7P-7A, please log into the web site [email or web address] and access using universal [place] password for that web site. If you do not have the password, please call the hospital operator.  02/11/2023, 8:40 AM

## 2023-02-11 NOTE — Progress Notes (Signed)
Progress Note  Patient Name: Brenda Matthews Date of Encounter: 02/11/2023  Primary Cardiologist: Chrystie Nose, MD   Subjective   Patient seen examined her bedside.  She is sitting up in the chair when I arrived.  No specific complaints at this time.  Ask if she is going to be discharged soon.  Inpatient Medications    Scheduled Meds:  amiodarone  200 mg Oral Daily   apixaban  2.5 mg Oral BID   atorvastatin  40 mg Oral q1800   calcium carbonate  1 tablet Oral Q breakfast   Chlorhexidine Gluconate Cloth  6 each Topical Daily   fluticasone  2 spray Each Nare Daily   insulin aspart  0-15 Units Subcutaneous TID WC   insulin aspart  0-5 Units Subcutaneous QHS   metoprolol tartrate  50 mg Oral BID   polyethylene glycol  17 g Oral BID   potassium chloride  30 mEq Oral Daily   torsemide  20 mg Oral BID   Continuous Infusions:  PRN Meds: acetaminophen **OR** acetaminophen, albuterol, lip balm, ondansetron **OR** ondansetron (ZOFRAN) IV   Vital Signs    Vitals:   02/10/23 1325 02/10/23 2107 02/11/23 0232 02/11/23 0620  BP: 128/75 (!) 138/95  128/82  Pulse: (!) 101     Resp: 20 20  20   Temp: 97.8 F (36.6 C) 97.8 F (36.6 C)  97.9 F (36.6 C)  TempSrc: Oral Oral  Oral  SpO2: 100% 99% 96% 96%  Weight:      Height:        Intake/Output Summary (Last 24 hours) at 02/11/2023 0854 Last data filed at 02/11/2023 0647 Gross per 24 hour  Intake 720 ml  Output 1925 ml  Net -1205 ml   Filed Weights   02/07/23 0900 02/08/23 0143 02/09/23 0455  Weight: 80.3 kg 77.2 kg 77.2 kg    Telemetry    Atrial fibrillation heart rate fluctuates controlled-personally Reviewed  ECG    None today- Personally Reviewed  Physical Exam    General: Comfortable, sitting up in a chair Head: Atraumatic, normal size  Eyes: PEERLA, EOMI  Neck: Supple, normal JVD Cardiac: Normal S1, S2; RRR; no murmurs, rubs, or gallops Lungs: Clear to auscultation bilaterally Abd: Soft, nontender, no  hepatomegaly  Ext: warm, 1+1 lower extremity edema Musculoskeletal: No deformities, BUE and BLE strength normal and equal Skin: Warm and dry, no rashes   Neuro: Alert and oriented to person, place, time, and situation, CNII-XII grossly intact, no focal deficits  Psych: Normal mood and affect   Labs    Chemistry Recent Labs  Lab 02/08/23 0453 02/09/23 0438 02/10/23 0732  NA 144 145 144  K 3.6 3.9 4.3  CL 101 100 102  CO2 35* 35* 32  GLUCOSE 86 103* 117*  BUN 26* 26* 24*  CREATININE 1.16* 1.54* 1.35*  CALCIUM 8.8* 9.2 9.1  GFRNONAA 46* 33* 38*  ANIONGAP 8 10 10      Hematology Recent Labs  Lab 02/09/23 0438 02/10/23 0732 02/11/23 0542  WBC 8.1 7.3 7.5  RBC 2.97* 2.82* 2.84*  HGB 8.3* 7.9* 8.1*  HCT 29.3* 27.5* 27.6*  MCV 98.7 97.5 97.2  MCH 27.9 28.0 28.5  MCHC 28.3* 28.7* 29.3*  RDW 16.6* 16.5* 16.5*  PLT 180 181 191    Cardiac EnzymesNo results for input(s): "TROPONINI" in the last 168 hours. No results for input(s): "TROPIPOC" in the last 168 hours.   BNPNo results for input(s): "BNP", "PROBNP" in the last 168  hours.   DDimer No results for input(s): "DDIMER" in the last 168 hours.   Radiology    DG Chest 2 View Result Date: 02/09/2023 CLINICAL DATA:  88 year old female "pneumonia". EXAM: CHEST - 2 VIEW COMPARISON:  Portable chest 02/04/2023 and earlier. FINDINGS: AP and lateral views at 0909 hours. Large lung volumes. Decreased but not resolved bilateral layering pleural effusions, now small and still larger on the left. Evidence of underlying cardiomegaly. Calcified aortic atherosclerosis. No pneumothorax or pulmonary edema. Pulmonary vascularity appears to be at baseline, no overt edema. No pneumothorax. Visualized tracheal air column is within normal limits. Osteopenia. No acute osseous abnormality identified. Paucity of bowel gas. IMPRESSION: 1. Decreased but not resolved bilateral layering pleural effusions, still larger on the left. 2. No other acute  cardiopulmonary abnormality. Cardiomegaly. Aortic Atherosclerosis (ICD10-I70.0). Electronically Signed   By: Odessa Fleming M.D.   On: 02/09/2023 09:19    Cardiac Studies   Reviewed previous echo  Patient Profile     88 y.o. female with history of heart failure preserved ejection fraction, paroxysmal atrial fibrillation, hypertension, hyperlipidemia here for acute exacerbation of heart failure and A-fib RVR  Assessment & Plan    Acute on chronic heart failure with preserved ejection fraction currently with acute exacerbation-resolving Paroxysmal atrial fibrillation-heart rate improving Chronic anemia  From a heart failure standpoint I agree she clinically appears to be euvolemic.  She is on torsemide 40 mg twice daily we will keep the patient on this.  Monitor her kidney function. Known paroxysmal atrial fibrillation usually is rate controlled.  She is currently on amiodarone 200 mg daily we will keep this.  Unfortunately cannot anticoagulate the patient until she has completed 3 weeks of uninterrupted anticoagulation.  She is aware of this.  So we will continue with rate control.  Continue her Lasix 40 mg twice a day, she is on Eliquis 2.5 based on renal function and age.  She will need physical therapy she is going to be discharged to SNF prior to home.      For questions or updates, please contact [facility] Please consult [email or web address] for contact info under Cardiology/STEMI.      SignedThomasene Ripple, DO  02/11/2023, 8:54 AM

## 2023-02-12 ENCOUNTER — Inpatient Hospital Stay (HOSPITAL_COMMUNITY): Payer: Medicare PPO

## 2023-02-12 DIAGNOSIS — I5031 Acute diastolic (congestive) heart failure: Secondary | ICD-10-CM | POA: Diagnosis not present

## 2023-02-12 LAB — BASIC METABOLIC PANEL
Anion gap: 8 (ref 5–15)
BUN: 23 mg/dL (ref 8–23)
CO2: 38 mmol/L — ABNORMAL HIGH (ref 22–32)
Calcium: 9.4 mg/dL (ref 8.9–10.3)
Chloride: 97 mmol/L — ABNORMAL LOW (ref 98–111)
Creatinine, Ser: 1.41 mg/dL — ABNORMAL HIGH (ref 0.44–1.00)
GFR, Estimated: 36 mL/min — ABNORMAL LOW (ref >60)
Glucose, Bld: 99 mg/dL (ref 70–99)
Potassium: 3.9 mmol/L (ref 3.5–5.1)
Sodium: 143 mmol/L (ref 135–145)

## 2023-02-12 LAB — CBC
HCT: 29 % — ABNORMAL LOW (ref 36.0–46.0)
Hemoglobin: 8.4 g/dL — ABNORMAL LOW (ref 12.0–15.0)
MCH: 28.2 pg (ref 26.0–34.0)
MCHC: 29 g/dL — ABNORMAL LOW (ref 30.0–36.0)
MCV: 97.3 fL (ref 80.0–100.0)
Platelets: 209 10*3/uL (ref 150–400)
RBC: 2.98 MIL/uL — ABNORMAL LOW (ref 3.87–5.11)
RDW: 16.5 % — ABNORMAL HIGH (ref 11.5–15.5)
WBC: 8.2 10*3/uL (ref 4.0–10.5)
nRBC: 0 % (ref 0.0–0.2)

## 2023-02-12 LAB — PHOSPHORUS: Phosphorus: 4 mg/dL (ref 2.5–4.6)

## 2023-02-12 LAB — GLUCOSE, CAPILLARY
Glucose-Capillary: 106 mg/dL — ABNORMAL HIGH (ref 70–99)
Glucose-Capillary: 110 mg/dL — ABNORMAL HIGH (ref 70–99)
Glucose-Capillary: 98 mg/dL (ref 70–99)
Glucose-Capillary: 86 mg/dL (ref 70–99)

## 2023-02-12 LAB — MAGNESIUM: Magnesium: 2.6 mg/dL — ABNORMAL HIGH (ref 1.7–2.4)

## 2023-02-12 MED ORDER — FERROUS SULFATE 325 (65 FE) MG PO TABS
325.0000 mg | ORAL_TABLET | ORAL | Status: DC
  Administered 2023-02-12 – 2023-02-14 (×2): 325 mg via ORAL
  Filled 2023-02-12 (×2): qty 1

## 2023-02-12 NOTE — Progress Notes (Signed)
PROGRESS NOTE    Brenda Matthews  ZOX:[id number] DOB: [birth year] DOA: 02/04/2023 PCP: Karna Dupes, MD  Chief Complaint  Patient presents with   Respiratory Distress    Brief Narrative:   88 year old female with atrial fibrillation, diastolic heart failure who presents to the hospital with shortness of breath, orthopnea and pedal edema up to her thighs from her nursing facility. She presented on Brenda Matthews CPAP and was transition to Brenda Matthews BiPAP. Eventually was transitioned down to 4 L of oxygen.   Treated for HF exacerbation.  Hospitalization complicated by delirium and RVR.  Cards following.   Assessment & Plan:   Principal Problem:   Acute diastolic (congestive) heart failure (HCC) Active Problems:   Atrial fibrillation and flutter (HCC)  Acute Hypoxic Respiratory Failure Heart Failure with Preserved Ejection Fraction  Acute Diastolic Heart Failure She has dependent edema to hips, still with O2 requirement CXR 1/18 with findings c/w congestive HF (+ bibasilar atelectasis or consolidation) Transitioned to PO lasix per cardiology -> now on torsemide - will discuss dose with cards Jardiance currently on hold  Strict I/O, daily weights  Oxygen requirement Related to above, repeat CXR today  Atrial fibrillation/flutter with RVR Amiodarone, metoprolol, eliquis Will defer rate control to cardiology  Acute Metabolic Encephalopathy Delirium  Head CT limited, but no acute intracranial process noted She's improving, but not to her baseline - suspect this was hospital acquired delirium Gradual improvement She'll need neurology follow up outpatient Delirium precautions  Tremor Resting tremor, L>R, she notes this is new TSH wnl Neurology follow up would be warranted Of note, amiodarone associated with enhanced physiologic tremor - I don't see other meds that could cause issue  CKD IIIb Monitor on diuretics Creatinine better than recent baseline  Anemia Appears c/w AOCD, elevated  b12, normal folate.  Normal iron/ferritin.  No signs of bleeding. Negative hemoccult.  Will monitor, transfuse for <7.  Hb drop noted in December.  Iron on low normal side, supplement Family requesting heme referral, this is reasonable - will place at discharge  Hypertension Hydralazine on hold Continue metop  Dyslipidemia Lipitor  Neuropathic Pain Gabapentin  Obesity Body mass index is 32.16 kg/m.    DVT prophylaxis: eliquis Code Status: full Family Communication: noen Disposition:   Status is: Inpatient Remains inpatient appropriate because: need for continued inpatient care   Consultants:  cards  Procedures:  none  Antimicrobials:  Anti-infectives (From admission, onward)    None       Subjective: No new complaints  Objective: Vitals:   02/11/23 2155 02/11/23 2155 02/12/23 0515 02/12/23 1300  BP: 123/87 123/87 (!) 131/93 122/62  Pulse: (!) 103 (!) 103 94 90  Resp:   (!) 22   Temp:   97.7 F (36.5 C) 97.9 F (36.6 C)  TempSrc:   Oral Oral  SpO2:   99% 97%  Weight:      Height:        Intake/Output Summary (Last 24 hours) at 02/12/2023 1419 Last data filed at 02/12/2023 0634 Gross per 24 hour  Intake 508 ml  Output 1500 ml  Net -992 ml   Filed Weights   02/07/23 0900 02/08/23 0143 02/09/23 0455  Weight: 80.3 kg 77.2 kg 77.2 kg    Examination:  General: No acute distress. Cardiovascular: Heart sounds show Brenda Matthews regular rate, and rhythm. No gallops or rubs. No murmurs. No JVD. Lungs: Clear to auscultation bilaterally with good air movement. No rales, rhonchi or wheezes. Abdomen: Soft, nontender, nondistended with normal  active bowel sounds. No masses. No hepatosplenomegaly. Neurological: Alert and oriented 3. Moves all extremities 4 with equal strength. Cranial nerves II through XII grossly intact. Skin: Warm and dry. No rashes or lesions. Extremities:  dependent edema  Data Reviewed: I have personally reviewed following labs and imaging  studies  CBC: Recent Labs  Lab 02/08/23 0453 02/09/23 0438 02/10/23 0732 02/11/23 0542 02/12/23 0528  WBC 7.7 8.1 7.3 7.5 8.2  NEUTROABS  --   --   --  5.5  --   HGB 7.3* 8.3* 7.9* 8.1* 8.4*  HCT 24.6* 29.3* 27.5* 27.6* 29.0*  MCV 95.3 98.7 97.5 97.2 97.3  PLT 167 180 181 191 209    Basic Metabolic Panel: Recent Labs  Lab 02/08/23 0453 02/09/23 0438 02/10/23 0732 02/11/23 0542 02/12/23 0528  NA 144 145 144 146* 143  K 3.6 3.9 4.3 3.8 3.9  CL 101 100 102 100 97*  CO2 35* 35* 32 36* 38*  GLUCOSE 86 103* 117* 99 99  BUN 26* 26* 24* 22 23  CREATININE 1.16* 1.54* 1.35* 1.45* 1.41*  CALCIUM 8.8* 9.2 9.1 9.2 9.4  MG  --  2.3  --  2.4 2.6*  PHOS  --  3.5  --  3.5 4.0    GFR: Estimated Creatinine Clearance: 26.9 mL/min (Brenda Matthews) (by C-G formula based on SCr of 1.41 mg/dL (H)).  Liver Function Tests: Recent Labs  Lab 02/11/23 0542  AST 26  ALT 20  ALKPHOS 71  BILITOT 1.1  PROT 5.8*  ALBUMIN 2.6*     CBG: Recent Labs  Lab 02/11/23 1146 02/11/23 1652 02/11/23 1943 02/12/23 0758 02/12/23 1138  GLUCAP 94 139* 85 106* 110*     Recent Results (from the past 240 hours)  Culture, blood (Routine x 2)     Status: None   Collection Time: 02/04/23  5:52 AM   Specimen: BLOOD RIGHT ARM  Result Value Ref Range Status   Specimen Description   Final    BLOOD RIGHT ARM Performed at [facility], [address], [place], [place] [address]    Special Requests   Final    BOTTLES DRAWN AEROBIC AND ANAEROBIC Blood Culture results may not be optimal due to an inadequate volume of blood received in culture bottles Performed at [facility], [address] [place], [place] [address]    Culture   Final    NO GROWTH 5 DAYS Performed at [facility], [address] [place], [place] [address]    Report Status 02/09/2023 FINAL  Final  Culture, blood (Routine x 2)     Status: None   Collection Time: 02/04/23  6:57 AM   Specimen: BLOOD RIGHT HAND   Result Value Ref Range Status   Specimen Description   Final    BLOOD RIGHT HAND Performed at [facility], [address], [place], [place] [address]    Special Requests   Final    BOTTLES DRAWN AEROBIC ONLY Blood Culture results may not be optimal due to an inadequate volume of blood received in culture bottles Performed at [facility], [address], [place], [place] [address]    Culture   Final    NO GROWTH 5 DAYS Performed at [facility], [address], [place], [place] [address]    Report Status 02/09/2023 FINAL  Final  Resp panel by RT-PCR (RSV, Flu Katalia Choma&B, Covid) Anterior Nasal Swab     Status: None  Collection Time: 02/04/23  8:02 AM   Specimen: Anterior Nasal Swab  Result Value Ref Range Status   SARS Coronavirus 2 by RT PCR NEGATIVE NEGATIVE Final    Comment: (NOTE) SARS-CoV-2 target nucleic acids are NOT DETECTED.  The SARS-CoV-2 RNA is generally detectable in upper respiratory specimens during the acute phase of infection. The lowest concentration of SARS-CoV-2 viral copies this assay can detect is 138 copies/mL. Brenda Matthews negative result does not preclude SARS-Cov-2 infection and should not be used as the sole basis for treatment or other patient management decisions. Brenda Matthews negative result may occur with  improper specimen collection/handling, submission of specimen other than nasopharyngeal swab, presence of viral mutation(s) within the areas targeted by this assay, and inadequate number of viral copies(<138 copies/mL). Brenda Matthews negative result must be combined with clinical observations, patient history, and epidemiological information. The expected result is Negative.  Fact Sheet for Patients:  BloggerCourse.com  Fact Sheet for Healthcare Providers:  SeriousBroker.it  This test is no t yet approved or cleared by the [place] FDA and  has been authorized for detection and/or diagnosis of  SARS-CoV-2 by FDA under an Emergency Use Authorization (EUA). This EUA will remain  in effect (meaning this test can be used) for the duration of the COVID-19 declaration under Section 564(b)(1) of the Act, 21 U.S.C.section 360bbb-3(b)(1), unless the authorization is terminated  or revoked sooner.       Influenza Brenda Matthews by PCR NEGATIVE NEGATIVE Final   Influenza B by PCR NEGATIVE NEGATIVE Final    Comment: (NOTE) The Xpert Xpress SARS-CoV-2/FLU/RSV plus assay is intended as an aid in the diagnosis of influenza from Nasopharyngeal swab specimens and should not be used as Brenda Matthews sole basis for treatment. Nasal washings and aspirates are unacceptable for Xpert Xpress SARS-CoV-2/FLU/RSV testing.  Fact Sheet for Patients: BloggerCourse.com  Fact Sheet for Healthcare Providers: SeriousBroker.it  This test is not yet approved or cleared by the [place] FDA and has been authorized for detection and/or diagnosis of SARS-CoV-2 by FDA under an Emergency Use Authorization (EUA). This EUA will remain in effect (meaning this test can be used) for the duration of the COVID-19 declaration under Section 564(b)(1) of the Act, 21 U.S.C. section 360bbb-3(b)(1), unless the authorization is terminated or revoked.     Resp Syncytial Virus by PCR NEGATIVE NEGATIVE Final    Comment: (NOTE) Fact Sheet for Patients: BloggerCourse.com  Fact Sheet for Healthcare Providers: SeriousBroker.it  This test is not yet approved or cleared by the [place] FDA and has been authorized for detection and/or diagnosis of SARS-CoV-2 by FDA under an Emergency Use Authorization (EUA). This EUA will remain in effect (meaning this test can be used) for the duration of the COVID-19 declaration under Section 564(b)(1) of the Act, 21 U.S.C. section 360bbb-3(b)(1), unless the authorization is terminated  or revoked.  Performed at [facility], [address] [place], [place] [address]   Urine Culture (for pregnant, neutropenic or urologic patients or patients with an indwelling urinary catheter)     Status: None   Collection Time: 02/09/23  4:21 PM   Specimen: Urine, Clean Catch  Result Value Ref Range Status   Specimen Description   Final    URINE, CLEAN CATCH Performed at [facility], [address], [place], [place] [address]    Special Requests   Final    NONE Performed at [facility], [address], [place], [place] [address]    Culture  Final    NO GROWTH Performed at [facility], [address], [place], [place] [address]    Report Status 02/10/2023 FINAL  Final         Radiology Studies: No results found.       Scheduled Meds:  amiodarone  200 mg Oral Daily   apixaban  2.5 mg Oral BID   atorvastatin  40 mg Oral q1800   calcium carbonate  1 tablet Oral Q breakfast   Chlorhexidine Gluconate Cloth  6 each Topical Daily   fluticasone  2 spray Each Nare Daily   insulin aspart  0-15 Units Subcutaneous TID WC   insulin aspart  0-5 Units Subcutaneous QHS   metoprolol tartrate  50 mg Oral BID   polyethylene glycol  17 g Oral BID   potassium chloride  30 mEq Oral Daily   torsemide  20 mg Oral BID   Continuous Infusions:     LOS: 8 days    Time spent: over 30 min     Lacretia Nicks, MD [facility]   To contact the attending provider between 7A-7P or the covering provider during after hours 7P-7A, please log into the web site [email or web address] and access using universal [place] password for that web site. If you do not have the password, please call the hospital operator.  02/12/2023, 2:19 PM

## 2023-02-12 NOTE — Progress Notes (Signed)
Mobility Specialist - Progress Note  (1L [place]) Pre-mobility: 98 bpm HR, 95% SpO2 During mobility: 122 bpm HR, 90% SpO2 Post-mobility: 113 bpm HR, 92% SPO2     02/12/23 0904  Mobility  Activity Transferred to/from [facility];Ambulated with assistance in hallway  Level of Assistance Contact guard assist, steadying assist  Assistive Device Front wheel walker  Distance Ambulated (ft) 30 ft  Range of Motion/Exercises Active  Activity Response Tolerated well  Mobility Referral Yes  Mobility visit 1 Mobility  Mobility Specialist Start Time (ACUTE ONLY) 0845  Mobility Specialist Stop Time (ACUTE ONLY) 0904  Mobility Specialist Time Calculation (min) (ACUTE ONLY) 19 min   Pt ws found on BSC. Assisted with pericare. Agreeable to ambulate afterwards. Stated being fearful of falling during session. At EOS returned to recliner chair with all needs met. Call bell in reach and chair alarm on. RN notified of session.  Billey Chang Mobility Specialist

## 2023-02-12 NOTE — Progress Notes (Signed)
Mobility Specialist - Progress Note  (RA) Pre-mobility: 90% SpO2 During mobility: 85% SpO2 Post-mobility: 91% SPO2   02/12/23 1627  Mobility  Activity Ambulated with assistance in hallway  Level of Assistance Contact guard assist, steadying assist  Assistive Device Front wheel walker  Distance Ambulated (ft) 40 ft  Range of Motion/Exercises Active  Activity Response Tolerated fair  Mobility Referral Yes  Mobility visit 1 Mobility  Mobility Specialist Start Time (ACUTE ONLY) 1615  Mobility Specialist Stop Time (ACUTE ONLY) 1627  Mobility Specialist Time Calculation (min) (ACUTE ONLY) 12 min   Pt was found on recliner chair and agreeable to ambulate. Stated feeling winded with session. At EOS returned to recliner chair with all needs met. Call bell in reach. RN notified.  Billey Chang Mobility Specialist

## 2023-02-12 NOTE — Plan of Care (Signed)
  Problem: Coping: Goal: Ability to adjust to condition or change in health will improve Outcome: Progressing   Problem: Fluid Volume: Goal: Ability to maintain a balanced intake and output will improve Outcome: Progressing   Problem: Metabolic: Goal: Ability to maintain appropriate glucose levels will improve Outcome: Progressing   Problem: Skin Integrity: Goal: Risk for impaired skin integrity will decrease Outcome: Progressing   Problem: Health Behavior/Discharge Planning: Goal: Ability to manage health-related needs will improve Outcome: Progressing

## 2023-02-12 NOTE — Progress Notes (Signed)
Progress Note  Patient Name: Brenda Matthews Date of Encounter: 02/12/2023  Primary Cardiologist: Chrystie Nose, MD   Subjective   Patient seen examined her bedside.  She is sitting up in the chair when I arrived.  No specific complaints at this time.  She is very pleased today that she was able to do some walking yesterday evening and today.  Inpatient Medications    Scheduled Meds:  amiodarone  200 mg Oral Daily   apixaban  2.5 mg Oral BID   atorvastatin  40 mg Oral q1800   calcium carbonate  1 tablet Oral Q breakfast   Chlorhexidine Gluconate Cloth  6 each Topical Daily   fluticasone  2 spray Each Nare Daily   insulin aspart  0-15 Units Subcutaneous TID WC   insulin aspart  0-5 Units Subcutaneous QHS   metoprolol tartrate  50 mg Oral BID   polyethylene glycol  17 g Oral BID   potassium chloride  30 mEq Oral Daily   torsemide  20 mg Oral BID   Continuous Infusions:  PRN Meds: acetaminophen **OR** acetaminophen, albuterol, lip balm, ondansetron **OR** ondansetron (ZOFRAN) IV   Vital Signs    Vitals:   02/11/23 1941 02/11/23 2155 02/11/23 2155 02/12/23 0515  BP: 118/79 123/87 123/87 (!) 131/93  Pulse: 96 (!) 103 (!) 103 94  Resp:    (!) 22  Temp: 98 F (36.7 C)   97.7 F (36.5 C)  TempSrc: Oral   Oral  SpO2: 100%   99%  Weight:      Height:        Intake/Output Summary (Last 24 hours) at 02/12/2023 0859 Last data filed at 02/12/2023 1610 Gross per 24 hour  Intake 508 ml  Output 1500 ml  Net -992 ml   Filed Weights   02/07/23 0900 02/08/23 0143 02/09/23 0455  Weight: 80.3 kg 77.2 kg 77.2 kg    Telemetry    Atrial fibrillation heart rate fluctuates controlled-personally Reviewed  ECG    None today- Personally Reviewed  Physical Exam    General: Comfortable, sitting up in a chair Head: Atraumatic, normal size  Eyes: PEERLA, EOMI  Neck: Supple, normal JVD Cardiac: Normal S1, S2; RRR; no murmurs, rubs, or gallops Lungs: Clear to auscultation  bilaterally Abd: Soft, nontender, no hepatomegaly  Ext: warm, 1+1 lower extremity edema Musculoskeletal: No deformities, BUE and BLE strength normal and equal Skin: Warm and dry, no rashes   Neuro: Alert and oriented to person, place, time, and situation, CNII-XII grossly intact, no focal deficits  Psych: Normal mood and affect   Labs    Chemistry Recent Labs  Lab 02/10/23 0732 02/11/23 0542 02/12/23 0528  NA 144 146* 143  K 4.3 3.8 3.9  CL 102 100 97*  CO2 32 36* 38*  GLUCOSE 117* 99 99  BUN 24* 22 23  CREATININE 1.35* 1.45* 1.41*  CALCIUM 9.1 9.2 9.4  PROT  --  5.8*  --   ALBUMIN  --  2.6*  --   AST  --  26  --   ALT  --  20  --   ALKPHOS  --  71  --   BILITOT  --  1.1  --   GFRNONAA 38* 35* 36*  ANIONGAP 10 10 8      Hematology Recent Labs  Lab 02/10/23 0732 02/11/23 0542 02/12/23 0528  WBC 7.3 7.5 8.2  RBC 2.82* 2.84* 2.98*  HGB 7.9* 8.1* 8.4*  HCT 27.5* 27.6* 29.0*  MCV 97.5 97.2 97.3  MCH 28.0 28.5 28.2  MCHC 28.7* 29.3* 29.0*  RDW 16.5* 16.5* 16.5*  PLT 181 191 209    Cardiac EnzymesNo results for input(s): "TROPONINI" in the last 168 hours. No results for input(s): "TROPIPOC" in the last 168 hours.   BNPNo results for input(s): "BNP", "PROBNP" in the last 168 hours.   DDimer No results for input(s): "DDIMER" in the last 168 hours.   Radiology    No results found.   Cardiac Studies   Reviewed previous echo  Patient Profile     88 y.o. female with history of heart failure preserved ejection fraction, paroxysmal atrial fibrillation, hypertension, hyperlipidemia here for acute exacerbation of heart failure and A-fib RVR  Assessment & Plan    Acute on chronic heart failure with preserved ejection fraction currently with acute exacerbation-resolving Paroxysmal atrial fibrillation-heart rate improving Chronic anemia Remains euvolemic.  She is on torsemide 40 mg twice daily we will keep the patient on this.  Monitor her kidney function. In  atrial fibrillation but is rate controlled, she is currently on amiodarone 200 mg daily we will keep this.  Unfortunately I cannot cardiovert the patient due to her missing Eliquis, she needs to complete 3 weeks of uninterrupted anticoagulation.  She is aware of this.  So we will continue with rate control.    Eliquis 2.5 based on renal function and age.  She will need physical therapy she is going to be discharged to SNF prior to home.      For questions or updates, please contact [facility] Please consult [email or web address] for contact info under Cardiology/STEMI.      Signed, Thomasene Ripple, DO  02/12/2023, 8:59 AM

## 2023-02-13 ENCOUNTER — Inpatient Hospital Stay (HOSPITAL_COMMUNITY): Payer: Medicare PPO

## 2023-02-13 DIAGNOSIS — I4819 Other persistent atrial fibrillation: Secondary | ICD-10-CM | POA: Diagnosis not present

## 2023-02-13 DIAGNOSIS — I5031 Acute diastolic (congestive) heart failure: Secondary | ICD-10-CM | POA: Diagnosis not present

## 2023-02-13 LAB — PHOSPHORUS: Phosphorus: 4 mg/dL (ref 2.5–4.6)

## 2023-02-13 LAB — BASIC METABOLIC PANEL
Anion gap: 13 (ref 5–15)
BUN: 29 mg/dL — ABNORMAL HIGH (ref 8–23)
CO2: 34 mmol/L — ABNORMAL HIGH (ref 22–32)
Calcium: 9.6 mg/dL (ref 8.9–10.3)
Chloride: 98 mmol/L (ref 98–111)
Creatinine, Ser: 1.88 mg/dL — ABNORMAL HIGH (ref 0.44–1.00)
GFR, Estimated: 26 mL/min — ABNORMAL LOW (ref >60)
Glucose, Bld: 109 mg/dL — ABNORMAL HIGH (ref 70–99)
Potassium: 3.7 mmol/L (ref 3.5–5.1)
Sodium: 145 mmol/L (ref 135–145)

## 2023-02-13 LAB — GLUCOSE, CAPILLARY
Glucose-Capillary: 127 mg/dL — ABNORMAL HIGH (ref 70–99)
Glucose-Capillary: 123 mg/dL — ABNORMAL HIGH (ref 70–99)
Glucose-Capillary: 77 mg/dL (ref 70–99)
Glucose-Capillary: 119 mg/dL — ABNORMAL HIGH (ref 70–99)

## 2023-02-13 LAB — CBC
HCT: 32.6 % — ABNORMAL LOW (ref 36.0–46.0)
Hemoglobin: 9.3 g/dL — ABNORMAL LOW (ref 12.0–15.0)
MCH: 28 pg (ref 26.0–34.0)
MCHC: 28.5 g/dL — ABNORMAL LOW (ref 30.0–36.0)
MCV: 98.2 fL (ref 80.0–100.0)
Platelets: 267 10*3/uL (ref 150–400)
RBC: 3.32 MIL/uL — ABNORMAL LOW (ref 3.87–5.11)
RDW: 16.5 % — ABNORMAL HIGH (ref 11.5–15.5)
WBC: 9.5 10*3/uL (ref 4.0–10.5)
nRBC: 0 % (ref 0.0–0.2)

## 2023-02-13 LAB — MAGNESIUM: Magnesium: 2.6 mg/dL — ABNORMAL HIGH (ref 1.7–2.4)

## 2023-02-13 MED ORDER — AMIODARONE HCL 200 MG PO TABS
200.0000 mg | ORAL_TABLET | Freq: Two times a day (BID) | ORAL | Status: DC
  Administered 2023-02-13 – 2023-02-15 (×4): 200 mg via ORAL
  Filled 2023-02-13 (×4): qty 1

## 2023-02-13 NOTE — Progress Notes (Signed)
   Patient Name: Brenda Matthews Date of Encounter: 02/13/2023 [facility] Cardiologist: Chrystie Nose, MD   Interval Summary  .    Remains in AFib w RVR 110s at rest. Feels better. Additional 700 mL net diuresis, almost 7 L since admission. Weight down 11 lb.  Vital Signs .    Vitals:   02/12/23 1937 02/12/23 2057 02/13/23 0419 02/13/23 0820  BP: 124/78 (!) 141/96 114/74 108/85  Pulse: 100 (!) 110 94 98  Resp: 20  20   Temp: 97.8 F (36.6 C)  98.4 F (36.9 C)   TempSrc: Oral  Oral   SpO2: 100%  94%   Weight:      Height:        Intake/Output Summary (Last 24 hours) at 02/13/2023 0926 Last data filed at 02/13/2023 0432 Gross per 24 hour  Intake 120 ml  Output 975 ml  Net -855 ml      02/09/2023    4:55 AM 02/08/2023    1:43 AM 02/07/2023    9:00 AM  Last 3 Weights  Weight (lbs) 170 lb 3.1 oz 170 lb 3.2 oz 177 lb 0.5 oz  Weight (kg) 77.2 kg 77.202 kg 80.3 kg      Telemetry/ECG    AFib w RVR - Personally Reviewed  Physical Exam .   GEN: No acute distress.   Neck: No JVD Cardiac: irregular, no murmurs, rubs, or gallops.  Respiratory: Clear to auscultation bilaterally. GI: Soft, nontender, non-distended  MS: No edema  Assessment & Plan .   88 y.o. female with history of heart failure preserved ejection fraction, paroxysmal atrial fibrillation, hypertension, hyperlipidemia here for acute exacerbation of heart failure and A-fib RVR  Clinically euvolemic. Remains in RVR.  Current dose of amiodarone is inadequate for an amiodarone naive patient. Increase to 200 mg twice daily for first 3 weeks. There really was never any interruption in her anticoagulation. She received full dose enoxaparin during the 36 hour period she was unable to take PO meds. Scheduled her for cardioversion 01/28 at 1130AM  w Dr. Duke Salvia.  May be ready for DC to SNF tomorrow PM if all goes well.  Informed Consent   Shared Decision Making/Informed Consent The risks (stroke, cardiac  arrhythmias rarely resulting in the need for a temporary or permanent pacemaker, skin irritation or burns and complications associated with conscious sedation including aspiration, arrhythmia, respiratory failure and death), benefits (restoration of normal sinus rhythm) and alternatives of a direct current cardioversion were explained in detail to Brenda Matthews and she agrees to proceed.        For questions or updates, please contact [facility] Please consult [email or web address] for contact info under        Signed, Thurmon Fair, MD

## 2023-02-13 NOTE — Progress Notes (Signed)
PROGRESS NOTE    Brenda Matthews  ZOX:[id number] DOB: 26-Oct-1935 DOA: 02/04/2023 PCP: Karna Dupes, MD  Chief Complaint  Patient presents with   Respiratory Distress    Brief Narrative:   88 year old female with atrial fibrillation, diastolic heart failure who presents to the hospital with shortness of breath, orthopnea and pedal edema up to her thighs from her nursing facility. She presented on Brenda Matthews CPAP and was transition to Brenda Matthews BiPAP. Eventually was transitioned down to 4 L of oxygen.   Treated for HF exacerbation.  Hospitalization complicated by delirium and RVR.  Cards following.   Assessment & Plan:   Principal Problem:   Acute diastolic (congestive) heart failure (HCC) Active Problems:   Atrial fibrillation and flutter (HCC)  Acute Hypoxic Respiratory Failure Heart Failure with Preserved Ejection Fraction  Acute Diastolic Heart Failure She has dependent edema to hips, still with O2 requirement (attempting to wean) CXR 1/18 with findings c/w congestive HF (+ bibasilar atelectasis or consolidation) Transitioned to PO lasix per cardiology -> was on torsemide 20 mg BID, currently on hold with AKI - reeval in AM Jardiance currently on hold  Strict I/O, daily weights  Oxygen requirement Bilateral Pleural Effusions CXR 1/26 with increasing bilateral effusions Will repeat 2 view CXR  Atrial fibrillation/flutter with RVR Amiodarone, metoprolol, eliquis Will defer rate control to cardiology -> planning for cardioversion 1/28  Acute Metabolic Encephalopathy Delirium  Head CT limited, but no acute intracranial process noted She's improving, but not to her baseline - suspect this was hospital acquired delirium Gradual improvement She'll need neurology follow up outpatient Delirium precautions  Tremor Resting tremor, L>R, she notes this is new TSH wnl Neurology follow up would be warranted Of note, amiodarone associated with enhanced physiologic tremor - I don't see other  meds that could cause issue  CKD IIIb Monitor on diuretics Creatinine better than recent baseline  Anemia Appears c/w AOCD, elevated b12, normal folate.  Normal iron/ferritin.  No signs of bleeding. Negative hemoccult.  Will monitor, transfuse for <7.  Hb drop noted in December.  Iron on low normal side, supplement Family requesting heme referral, this is reasonable - will place at discharge  Hypertension Hydralazine on hold Continue metop  Dyslipidemia Lipitor  Neuropathic Pain Gabapentin  Obesity Body mass index is 32.16 kg/m.    DVT prophylaxis: eliquis Code Status: full Family Communication: noen Disposition:   Status is: Inpatient Remains inpatient appropriate because: need for continued inpatient care   Consultants:  cards  Procedures:  none  Antimicrobials:  Anti-infectives (From admission, onward)    None       Subjective: No new complaints  Objective: Vitals:   02/13/23 0419 02/13/23 0820 02/13/23 1217 02/13/23 1727  BP: 114/74 108/85 113/74   Pulse: 94 98 (!) 108   Resp: 20  16   Temp: 98.4 F (36.9 C)  97.9 F (36.6 C)   TempSrc: Oral  Oral   SpO2: 94%  98% 98%  Weight:      Height:        Intake/Output Summary (Last 24 hours) at 02/13/2023 1730 Last data filed at 02/13/2023 0800 Gross per 24 hour  Intake 220 ml  Output 575 ml  Net -355 ml   Filed Weights   02/07/23 0900 02/08/23 0143 02/09/23 0455  Weight: 80.3 kg 77.2 kg 77.2 kg    Examination:  General: No acute distress. Cardiovascular: RRR Lungs: unlabored Neurological: Alert and oriented 3. Moves all extremities 4 with equal strength. Cranial nerves  II through XII grossly intact. Extremities: dependent edema   Data Reviewed: I have personally reviewed following labs and imaging studies  CBC: Recent Labs  Lab 02/09/23 0438 02/10/23 0732 02/11/23 0542 02/12/23 0528 02/13/23 0448  WBC 8.1 7.3 7.5 8.2 9.5  NEUTROABS  --   --  5.5  --   --   HGB 8.3*  7.9* 8.1* 8.4* 9.3*  HCT 29.3* 27.5* 27.6* 29.0* 32.6*  MCV 98.7 97.5 97.2 97.3 98.2  PLT 180 181 191 209 267    Basic Metabolic Panel: Recent Labs  Lab 02/09/23 0438 02/10/23 0732 02/11/23 0542 02/12/23 0528 02/13/23 0448  NA 145 144 146* 143 145  K 3.9 4.3 3.8 3.9 3.7  CL 100 102 100 97* 98  CO2 35* 32 36* 38* 34*  GLUCOSE 103* 117* 99 99 109*  BUN 26* 24* 22 23 29*  CREATININE 1.54* 1.35* 1.45* 1.41* 1.88*  CALCIUM 9.2 9.1 9.2 9.4 9.6  MG 2.3  --  2.4 2.6* 2.6*  PHOS 3.5  --  3.5 4.0 4.0    GFR: Estimated Creatinine Clearance: 20.2 mL/min (Brenda Matthews) (by C-G formula based on SCr of 1.88 mg/dL (H)).  Liver Function Tests: Recent Labs  Lab 02/11/23 0542  AST 26  ALT 20  ALKPHOS 71  BILITOT 1.1  PROT 5.8*  ALBUMIN 2.6*     CBG: Recent Labs  Lab 02/12/23 1646 02/12/23 2106 02/13/23 0722 02/13/23 1216 02/13/23 1709  GLUCAP 98 86 127* 123* 77     Recent Results (from the past 240 hours)  Culture, blood (Routine x 2)     Status: None   Collection Time: 02/04/23  5:52 AM   Specimen: BLOOD RIGHT ARM  Result Value Ref Range Status   Specimen Description   Final    BLOOD RIGHT ARM Performed at [facility], [address], [place], [place] [address]    Special Requests   Final    BOTTLES DRAWN AEROBIC AND ANAEROBIC Blood Culture results may not be optimal due to an inadequate volume of blood received in culture bottles Performed at [facility], [address] [place] [address] [place], [place] [address]    Culture   Final    NO GROWTH 5 DAYS Performed at [facility], [address], [place], [place] [address]    Report Status 02/09/2023 FINAL  Final  Culture, blood (Routine x 2)     Status: None   Collection Time: 02/04/23  6:57 AM   Specimen: BLOOD RIGHT HAND  Result Value Ref Range Status   Specimen Description   Final    BLOOD RIGHT HAND Performed at [facility], [address] [place], [place] [address]    Special Requests    Final    BOTTLES DRAWN AEROBIC ONLY Blood Culture results may not be optimal due to an inadequate volume of blood received in culture bottles Performed at [facility], [address] [place], [place] [address]    Culture   Final    NO GROWTH 5 DAYS Performed at [facility], [address], [place], [place] [address]    Report Status 02/09/2023 FINAL  Final  Resp panel by RT-PCR (RSV, Flu Latisha Lasch&B, Covid) Anterior Nasal Swab     Status: None   Collection Time: 02/04/23  8:02 AM   Specimen: Anterior Nasal Swab  Result Value Ref Range Status   SARS Coronavirus 2 by RT PCR NEGATIVE NEGATIVE Final  Comment: (NOTE) SARS-CoV-2 target nucleic acids are NOT DETECTED.  The SARS-CoV-2 RNA is generally detectable in upper respiratory specimens during the acute phase of infection. The lowest concentration of SARS-CoV-2 viral copies this assay can detect is 138 copies/mL. Hisako Bugh negative result does not preclude SARS-Cov-2 infection and should not be used as the sole basis for treatment or other patient management decisions. Anwita Mencer negative result may occur with  improper specimen collection/handling, submission of specimen other than nasopharyngeal swab, presence of viral mutation(s) within the areas targeted by this assay, and inadequate number of viral copies(<138 copies/mL). Xiomara Sevillano negative result must be combined with clinical observations, patient history, and epidemiological information. The expected result is Negative.  Fact Sheet for Patients:  BloggerCourse.com  Fact Sheet for Healthcare Providers:  SeriousBroker.it  This test is no t yet approved or cleared by the [place] FDA and  has been authorized for detection and/or diagnosis of SARS-CoV-2 by FDA under an Emergency Use Authorization (EUA). This EUA will remain  in effect (meaning this test can be used) for the duration of the COVID-19 declaration under Section  564(b)(1) of the Act, 21 U.S.C.section 360bbb-3(b)(1), unless the authorization is terminated  or revoked sooner.       Influenza Zondra Lawlor by PCR NEGATIVE NEGATIVE Final   Influenza B by PCR NEGATIVE NEGATIVE Final    Comment: (NOTE) The Xpert Xpress SARS-CoV-2/FLU/RSV plus assay is intended as an aid in the diagnosis of influenza from Nasopharyngeal swab specimens and should not be used as Myalee Stengel sole basis for treatment. Nasal washings and aspirates are unacceptable for Xpert Xpress SARS-CoV-2/FLU/RSV testing.  Fact Sheet for Patients: BloggerCourse.com  Fact Sheet for Healthcare Providers: SeriousBroker.it  This test is not yet approved or cleared by the [place] FDA and has been authorized for detection and/or diagnosis of SARS-CoV-2 by FDA under an Emergency Use Authorization (EUA). This EUA will remain in effect (meaning this test can be used) for the duration of the COVID-19 declaration under Section 564(b)(1) of the Act, 21 U.S.C. section 360bbb-3(b)(1), unless the authorization is terminated or revoked.     Resp Syncytial Virus by PCR NEGATIVE NEGATIVE Final    Comment: (NOTE) Fact Sheet for Patients: BloggerCourse.com  Fact Sheet for Healthcare Providers: SeriousBroker.it  This test is not yet approved or cleared by the [place] FDA and has been authorized for detection and/or diagnosis of SARS-CoV-2 by FDA under an Emergency Use Authorization (EUA). This EUA will remain in effect (meaning this test can be used) for the duration of the COVID-19 declaration under Section 564(b)(1) of the Act, 21 U.S.C. section 360bbb-3(b)(1), unless the authorization is terminated or revoked.  Performed at [facility], [address] [place], [place] [address]   Urine Culture (for pregnant, neutropenic or urologic patients or patients with an indwelling urinary  catheter)     Status: None   Collection Time: 02/09/23  4:21 PM   Specimen: Urine, Clean Catch  Result Value Ref Range Status   Specimen Description   Final    URINE, CLEAN CATCH Performed at [facility], [address] [place], [place] [address]    Special Requests   Final    NONE Performed at [facility], [address] [place], [place] [address]    Culture   Final    NO GROWTH Performed at [facility], [address] [place], [place] [address]    Report Status 02/10/2023 FINAL  Final  Radiology Studies: DG CHEST PORT 1 VIEW Result Date: 02/12/2023 CLINICAL DATA:  Shortness of breath.  Pedal edema. EXAM: PORTABLE CHEST 1 VIEW COMPARISON:  02/09/2023 FINDINGS: Unchanged heart size and mediastinal contours. Aortic atherosclerosis. Increasing bilateral pleural effusions and bibasilar volume loss, typical of atelectasis. Slight vascular congestion without edema. No pneumothorax. IMPRESSION: 1. Increasing bilateral pleural effusions and bibasilar atelectasis. 2. Slight vascular congestion without edema. Electronically Signed   By: Narda Rutherford M.D.   On: 02/12/2023 18:35         Scheduled Meds:  amiodarone  200 mg Oral BID   apixaban  2.5 mg Oral BID   atorvastatin  40 mg Oral q1800   calcium carbonate  1 tablet Oral Q breakfast   Chlorhexidine Gluconate Cloth  6 each Topical Daily   ferrous sulfate  325 mg Oral QODAY   fluticasone  2 spray Each Nare Daily   insulin aspart  0-15 Units Subcutaneous TID WC   insulin aspart  0-5 Units Subcutaneous QHS   metoprolol tartrate  50 mg Oral BID   polyethylene glycol  17 g Oral BID   potassium chloride  30 mEq Oral Daily   Continuous Infusions:     LOS: 9 days    Time spent: over 30 min     Lacretia Nicks, MD [facility]   To contact the attending provider between 7A-7P or the covering provider during after hours 7P-7A, please log into the web site  [email or web address] and access using universal [place] password for that web site. If you do not have the password, please call the hospital operator.  02/13/2023, 5:30 PM

## 2023-02-13 NOTE — Anesthesia Preprocedure Evaluation (Signed)
Anesthesia Evaluation  Patient identified by MRN, date of birth, ID band Patient awake    Reviewed: Allergy & Precautions, NPO status , Patient's Chart, lab work & pertinent test results, reviewed documented beta blocker date and time   Airway Mallampati: II  TM Distance: >3 FB Neck ROM: Full    Dental  (+) Edentulous Upper, Edentulous Lower, Lower Dentures, Upper Dentures   Pulmonary neg pulmonary ROS   Pulmonary exam normal breath sounds clear to auscultation       Cardiovascular hypertension, Pt. on medications and Pt. on home beta blockers +CHF and + DOE  Normal cardiovascular exam+ dysrhythmias (eliquis) Atrial Fibrillation  Rhythm:Irregular Rate:Normal  TTE 2024 1. Left ventricular ejection fraction, by estimation, is 50 to 55%. The  left ventricle has low normal function. Left ventricular endocardial  border not optimally defined to evaluate regional wall motion. There is  mild concentric left ventricular  hypertrophy. Left ventricular diastolic parameters are indeterminate.   2. Right ventricular systolic function is normal. The right ventricular  size is normal.   3. Left atrial size was moderately dilated.   4. Right atrial size was mildly dilated.   5. The mitral valve is grossly normal. Mild to moderate mitral valve  regurgitation.   6. The aortic valve is tricuspid. There is mild calcification of the  aortic valve. There is mild thickening of the aortic valve. Aortic valve  regurgitation is mild. Aortic valve sclerosis/calcification is present,  without any evidence of aortic  stenosis.   7. Aortic dilatation noted. There is mild dilatation of the ascending  aorta, measuring 41 mm.     Neuro/Psych  PSYCHIATRIC DISORDERS Anxiety     negative neurological ROS     GI/Hepatic negative GI ROS, Neg liver ROS,,,  Endo/Other  negative endocrine ROS    Renal/GU Renal Insufficiency and CRFRenal disease  negative  genitourinary   Musculoskeletal  (+) Arthritis ,    Abdominal   Peds  Hematology negative hematology ROS (+)   Anesthesia Other Findings 88 y.o. female past medical history of chronic diastolic heart failure, chronic kidney disease stage IIIb, paroxysmal atrial fibrillation essential hypertension .  Reproductive/Obstetrics                             Anesthesia Physical Anesthesia Plan  ASA: 4  Anesthesia Plan: General   Post-op Pain Management: Minimal or no pain anticipated   Induction: Intravenous  PONV Risk Score and Plan: Propofol infusion and Treatment may vary due to age or medical condition  Airway Management Planned: Natural Airway and Simple Face Mask  Additional Equipment: None  Intra-op Plan:   Post-operative Plan:   Informed Consent: I have reviewed the patients History and Physical, chart, labs and discussed the procedure including the risks, benefits and alternatives for the proposed anesthesia with the patient or authorized representative who has indicated his/her understanding and acceptance.     Dental advisory given  Plan Discussed with: CRNA and Anesthesiologist  Anesthesia Plan Comments:        Anesthesia Quick Evaluation

## 2023-02-13 NOTE — H&P (View-Only) (Signed)
   Patient Name: Brenda Matthews Date of Encounter: 02/13/2023 [facility] Cardiologist: Chrystie Nose, MD   Interval Summary  .    Remains in AFib w RVR 110s at rest. Feels better. Additional 700 mL net diuresis, almost 7 L since admission. Weight down 11 lb.  Vital Signs .    Vitals:   02/12/23 1937 02/12/23 2057 02/13/23 0419 02/13/23 0820  BP: 124/78 (!) 141/96 114/74 108/85  Pulse: 100 (!) 110 94 98  Resp: 20  20   Temp: 97.8 F (36.6 C)  98.4 F (36.9 C)   TempSrc: Oral  Oral   SpO2: 100%  94%   Weight:      Height:        Intake/Output Summary (Last 24 hours) at 02/13/2023 0926 Last data filed at 02/13/2023 0432 Gross per 24 hour  Intake 120 ml  Output 975 ml  Net -855 ml      02/09/2023    4:55 AM 02/08/2023    1:43 AM 02/07/2023    9:00 AM  Last 3 Weights  Weight (lbs) 170 lb 3.1 oz 170 lb 3.2 oz 177 lb 0.5 oz  Weight (kg) 77.2 kg 77.202 kg 80.3 kg      Telemetry/ECG    AFib w RVR - Personally Reviewed  Physical Exam .   GEN: No acute distress.   Neck: No JVD Cardiac: irregular, no murmurs, rubs, or gallops.  Respiratory: Clear to auscultation bilaterally. GI: Soft, nontender, non-distended  MS: No edema  Assessment & Plan .   88 y.o. female with history of heart failure preserved ejection fraction, paroxysmal atrial fibrillation, hypertension, hyperlipidemia here for acute exacerbation of heart failure and A-fib RVR  Clinically euvolemic. Remains in RVR.  Current dose of amiodarone is inadequate for an amiodarone naive patient. Increase to 200 mg twice daily for first 3 weeks. There really was never any interruption in her anticoagulation. She received full dose enoxaparin during the 36 hour period she was unable to take PO meds. Scheduled her for cardioversion 01/28 at 1130AM  w Dr. Duke Salvia.  May be ready for DC to SNF tomorrow PM if all goes well.  Informed Consent   Shared Decision Making/Informed Consent The risks (stroke, cardiac  arrhythmias rarely resulting in the need for a temporary or permanent pacemaker, skin irritation or burns and complications associated with conscious sedation including aspiration, arrhythmia, respiratory failure and death), benefits (restoration of normal sinus rhythm) and alternatives of a direct current cardioversion were explained in detail to Ms. Villacis and she agrees to proceed.        For questions or updates, please contact [facility] Please consult [email or web address] for contact info under        Signed, Thurmon Fair, MD

## 2023-02-13 NOTE — Plan of Care (Signed)
  Problem: Fluid Volume: Goal: Ability to maintain a balanced intake and output will improve Outcome: Progressing   Problem: Nutritional: Goal: Maintenance of adequate nutrition will improve Outcome: Progressing   Problem: Coping: Goal: Level of anxiety will decrease Outcome: Progressing

## 2023-02-13 NOTE — TOC Progression Note (Signed)
Transition of Care [facility]) - Progression Note    Patient Details  Name: Brenda Matthews MRN: [id number] Date of Birth: [birth year]  Transition of [facility]) CM/SW Contact  Mohammedali Bedoy, Olegario Messier, RN Phone Number: 02/13/2023, 3:34 PM  Clinical Narrative:   ! [facility] can accept tomorrow if stable.     Expected Discharge Plan: Skilled Nursing Facility Barriers to Discharge: Continued Medical Work up  Expected Discharge Plan and Services In-house Referral: Clinical Social Work     Living arrangements for the past 2 months: Independent Living Facility                                       Social Determinants of Health (SDOH) Interventions SDOH Screenings   Food Insecurity: No Food Insecurity (02/04/2023)  Housing: Low Risk  (02/04/2023)  Transportation Needs: No Transportation Needs (02/04/2023)  Utilities: Not At Risk (02/04/2023)  Alcohol Screen: Low Risk  (10/11/2020)  Depression (PHQ2-9): Low Risk  (09/01/2022)  Financial Resource Strain: Low Risk  (10/27/2021)  Physical Activity: Sufficiently Active (10/27/2021)  Social Connections: Moderately Integrated (02/04/2023)  Stress: No Stress Concern Present (10/27/2021)  Tobacco Use: Low Risk  (02/04/2023)    Readmission Risk Interventions    02/06/2023    2:09 PM 10/24/2022    9:08 AM  Readmission Risk Prevention Plan  Post Dischage Appt  Complete  Medication Screening  Complete  Transportation Screening Complete Complete  HRI or Home Care Consult Complete   Social Work Consult for Recovery Care Planning/Counseling Complete   Palliative Care Screening Not Applicable   Medication Review Oceanographer) Complete

## 2023-02-13 NOTE — Progress Notes (Signed)
Physical Therapy Treatment Patient Details Name: Brenda Matthews MRN: [id number] DOB: 08-31-1935 Today's Date: 02/13/2023   History of Present Illness Brenda Matthews is a 88 y.o. female admitted 02/04/23 for acute diastolic congestive heart failure. Of note, pt recently hospitalized 01/02/23 - 01/14/23 for right lower extremity cellulitis and heart failure decompensation. PMH: chronic renal insufficiency, chronic anemia, non-insulin-dependent diabetes, hyperlipidemia, HTN, afib, CHF, osteopenia    PT Comments  Pt progressing slowly.  Pt was sitting EOB with RN requesting to get OOB to recliner.  Assisted with amb in hallway.  General transfer comment: pt required Mod/Min Assist to rise from elevated bed with VC's on proper hand placement esp with stand to sit. General Gait Details: tolerated an increased distance but still limited due to dyspnea/fatigue with RA decreased to 86% and HR ranged from 78 to 129 (A Fib).  Recliner following behind for safety. Positioned in recliner to comfort.   Pt will need ST Rehab at SNF to address mobility and functional decline prior to safely returning home.    If plan is discharge home, recommend the following: A lot of help with walking and/or transfers;A lot of help with bathing/dressing/bathroom;Assistance with cooking/housework;Assist for transportation   Can travel by private vehicle     No  Equipment Recommendations  None recommended by PT    Recommendations for Other Services       Precautions / Restrictions Precautions Precautions: Fall Precaution Comments: monitor vitals Restrictions Weight Bearing Restrictions Per Provider Order: No     Mobility  Bed Mobility   Bed Mobility: Supine to Sit           General bed mobility comments: Pt sitting EOB with RN in room    Transfers Overall transfer level: Needs assistance Equipment used: Rolling walker (2 wheels) Transfers: Sit to/from Stand Sit to Stand: Mod assist, +2 safety/equipment, Min  assist           General transfer comment: pt required Mod/Min Assist to rise from elevated bed with VC's on proper hand placement esp with stand to sit.    Ambulation/Gait Ambulation/Gait assistance: Min assist, Mod assist, +2 physical assistance, +2 safety/equipment Gait Distance (Feet): 23 Feet Assistive device: Rolling walker (2 wheels) Gait Pattern/deviations: Step-through pattern, Decreased stride length, Trunk flexed Gait velocity: decreased     General Gait Details: tolerated an increased distance but still limited due to dyspnea/fatigue with RA decreased to 86% and HR ranged from 78 to 129 (A Fib).  Recliner following behind for safety.   Stairs             Wheelchair Mobility     Tilt Bed    Modified Rankin (Stroke Patients Only)       Balance                                            Cognition Arousal: Alert Behavior During Therapy: WFL for tasks assessed/performed Overall Cognitive Status: No family/caregiver present to determine baseline cognitive functioning                                 General Comments: AxO x 3 improved level of alertness.  Was unable to recall amb with me a few days ago but did share she is having a Cardiac procedure tomorrow "to be me back in  rythm" stated pt.        Exercises      General Comments        Pertinent Vitals/Pain Pain Assessment Pain Assessment: No/denies pain    Home Living                          Prior Function            PT Goals (current goals can now be found in the care plan section) Progress towards PT goals: Progressing toward goals    Frequency    Min 1X/week      PT Plan      Co-evaluation              AM-PAC PT "6 Clicks" Mobility   Outcome Measure  Help needed turning from your back to your side while in a flat bed without using bedrails?: A Lot Help needed moving from lying on your back to sitting on the side of a  flat bed without using bedrails?: A Lot Help needed moving to and from a bed to a chair (including a wheelchair)?: A Lot Help needed standing up from a chair using your arms (e.g., wheelchair or bedside chair)?: A Lot Help needed to walk in hospital room?: A Lot Help needed climbing 3-5 steps with a railing? : Total 6 Click Score: 11    End of Session Equipment Utilized During Treatment: Gait belt;Oxygen Activity Tolerance: Patient limited by fatigue Patient left: in chair;with call bell/phone within reach;with chair alarm set Nurse Communication: Mobility status PT Visit Diagnosis: Unsteadiness on feet (R26.81);Muscle weakness (generalized) (M62.81)     Time: 0865-7846 PT Time Calculation (min) (ACUTE ONLY): 23 min  Charges:    $Gait Training: 8-22 mins $Therapeutic Activity: 8-22 mins PT General Charges $$ ACUTE PT VISIT: 1 Visit                     Felecia Shelling  PTA Acute  Rehabilitation Services Office M-F          [phone]

## 2023-02-14 ENCOUNTER — Inpatient Hospital Stay (HOSPITAL_COMMUNITY): Payer: Medicare PPO | Admitting: Anesthesiology

## 2023-02-14 ENCOUNTER — Encounter (HOSPITAL_COMMUNITY)
Admission: EM | Disposition: A | Discharge status: Skilled Nursing Facility | Payer: Self-pay | Source: Skilled Nursing Facility | Attending: Family Medicine

## 2023-02-14 ENCOUNTER — Inpatient Hospital Stay (HOSPITAL_COMMUNITY): Payer: Self-pay | Admitting: Anesthesiology

## 2023-02-14 ENCOUNTER — Encounter (HOSPITAL_COMMUNITY): Payer: Self-pay | Admitting: Internal Medicine

## 2023-02-14 DIAGNOSIS — I130 Hypertensive heart and chronic kidney disease with heart failure and stage 1 through stage 4 chronic kidney disease, or unspecified chronic kidney disease: Secondary | ICD-10-CM

## 2023-02-14 DIAGNOSIS — I4891 Unspecified atrial fibrillation: Secondary | ICD-10-CM | POA: Diagnosis not present

## 2023-02-14 DIAGNOSIS — I5033 Acute on chronic diastolic (congestive) heart failure: Secondary | ICD-10-CM | POA: Diagnosis not present

## 2023-02-14 DIAGNOSIS — N1832 Chronic kidney disease, stage 3b: Secondary | ICD-10-CM

## 2023-02-14 DIAGNOSIS — I5031 Acute diastolic (congestive) heart failure: Secondary | ICD-10-CM | POA: Diagnosis not present

## 2023-02-14 HISTORY — PX: CARDIOVERSION: EP1203

## 2023-02-14 LAB — COMPREHENSIVE METABOLIC PANEL
ALT: 22 U/L (ref 0–44)
AST: 32 U/L (ref 15–41)
Albumin: 2.9 g/dL — ABNORMAL LOW (ref 3.5–5.0)
Alkaline Phosphatase: 83 U/L (ref 38–126)
Anion gap: 11 (ref 5–15)
BUN: 33 mg/dL — ABNORMAL HIGH (ref 8–23)
CO2: 31 mmol/L (ref 22–32)
Calcium: 8.6 mg/dL — ABNORMAL LOW (ref 8.9–10.3)
Chloride: 96 mmol/L — ABNORMAL LOW (ref 98–111)
Creatinine, Ser: 2.17 mg/dL — ABNORMAL HIGH (ref 0.44–1.00)
GFR, Estimated: 22 mL/min — ABNORMAL LOW (ref >60)
Glucose, Bld: 105 mg/dL — ABNORMAL HIGH (ref 70–99)
Potassium: 3.8 mmol/L (ref 3.5–5.1)
Sodium: 138 mmol/L (ref 135–145)
Total Bilirubin: 1 mg/dL (ref 0.0–1.2)
Total Protein: 6.6 g/dL (ref 6.5–8.1)

## 2023-02-14 LAB — GLUCOSE, CAPILLARY
Glucose-Capillary: 97 mg/dL (ref 70–99)
Glucose-Capillary: 140 mg/dL — ABNORMAL HIGH (ref 70–99)
Glucose-Capillary: 80 mg/dL (ref 70–99)

## 2023-02-14 LAB — CBC WITH DIFFERENTIAL/PLATELET
Abs Immature Granulocytes: 0.06 10*3/uL (ref 0.00–0.07)
Basophils Absolute: 0 10*3/uL (ref 0.0–0.1)
Basophils Relative: 0 %
Eosinophils Absolute: 0.1 10*3/uL (ref 0.0–0.5)
Eosinophils Relative: 1 %
HCT: 29.2 % — ABNORMAL LOW (ref 36.0–46.0)
Hemoglobin: 8.6 g/dL — ABNORMAL LOW (ref 12.0–15.0)
Immature Granulocytes: 0 %
Lymphocytes Relative: 7 %
Lymphs Abs: 1 10*3/uL (ref 0.7–4.0)
MCH: 28.1 pg (ref 26.0–34.0)
MCHC: 29.5 g/dL — ABNORMAL LOW (ref 30.0–36.0)
MCV: 95.4 fL (ref 80.0–100.0)
Monocytes Absolute: 1 10*3/uL (ref 0.1–1.0)
Monocytes Relative: 7 %
Neutro Abs: 11.7 10*3/uL — ABNORMAL HIGH (ref 1.7–7.7)
Neutrophils Relative %: 85 %
Platelets: 245 10*3/uL (ref 150–400)
RBC: 3.06 MIL/uL — ABNORMAL LOW (ref 3.87–5.11)
RDW: 16.5 % — ABNORMAL HIGH (ref 11.5–15.5)
WBC: 13.9 10*3/uL — ABNORMAL HIGH (ref 4.0–10.5)
nRBC: 0 % (ref 0.0–0.2)

## 2023-02-14 LAB — MAGNESIUM: Magnesium: 2.4 mg/dL (ref 1.7–2.4)

## 2023-02-14 LAB — PHOSPHORUS: Phosphorus: 3.2 mg/dL (ref 2.5–4.6)

## 2023-02-14 SURGERY — CARDIOVERSION (CATH LAB)
Anesthesia: General

## 2023-02-14 MED ORDER — AMIODARONE HCL 200 MG PO TABS: ORAL_TABLET | ORAL | 0 refills | Status: DC

## 2023-02-14 MED ORDER — MELATONIN 5 MG PO TABS
5.0000 mg | ORAL_TABLET | Freq: Once | ORAL | Status: AC
  Administered 2023-02-14: 5 mg via ORAL
  Filled 2023-02-14: qty 1

## 2023-02-14 MED ORDER — SODIUM CHLORIDE 0.9% FLUSH: 3.0000 mL | INTRAVENOUS | Status: DC | PRN

## 2023-02-14 MED ORDER — APIXABAN 5 MG PO TABS
ORAL_TABLET | ORAL | Status: AC
  Filled 2023-02-14: qty 1

## 2023-02-14 MED ORDER — METOPROLOL TARTRATE 50 MG PO TABS: 50.0000 mg | ORAL_TABLET | Freq: Two times a day (BID) | ORAL | 1 refills | Status: DC

## 2023-02-14 MED ORDER — FERROUS SULFATE 325 (65 FE) MG PO TABS: 325.0000 mg | ORAL_TABLET | ORAL | Status: DC

## 2023-02-14 MED ORDER — SODIUM CHLORIDE 0.9% FLUSH: 3.0000 mL | Freq: Two times a day (BID) | INTRAVENOUS | Status: DC

## 2023-02-14 MED ORDER — LIDOCAINE 2% (20 MG/ML) 5 ML SYRINGE
INTRAMUSCULAR | Status: DC | PRN
  Administered 2023-02-14: 40 mg via INTRAVENOUS

## 2023-02-14 MED ORDER — PROPOFOL 10 MG/ML IV BOLUS
INTRAVENOUS | Status: DC | PRN
  Administered 2023-02-14: 40 mg via INTRAVENOUS

## 2023-02-14 SURGICAL SUPPLY — 1 items: PAD DEFIB RADIO PHYSIO CONN (PAD) ×1 IMPLANT

## 2023-02-14 NOTE — CV Procedure (Signed)
Electrical Cardioversion Procedure Note Brenda Matthews [id number] 08-03-1935  Procedure: Electrical Cardioversion Indications:  Atrial Fibrillation  Procedure Details Consent: Risks of procedure as well as the alternatives and risks of each were explained to the (patient/caregiver).  Consent for procedure obtained. Time Out: Verified patient identification, verified procedure, site/side was marked, verified correct patient position, special equipment/implants available, medications/allergies/relevent history reviewed, required imaging and test results available.  Performed  Patient placed on cardiac monitor, pulse oximetry, supplemental oxygen as necessary.  Sedation given:  propofol Pacer pads placed anterior and posterior chest.  Cardioverted 1 time(s).  Cardioverted at 200J.  Evaluation Findings: Post procedure EKG shows: NSR Complications: None Patient did tolerate procedure well.   Chilton Si, MD 02/14/2023, 11:31 AM

## 2023-02-14 NOTE — Interval H&P Note (Signed)
History and Physical Interval Note:  02/14/2023 10:05 AM  Brenda Matthews  has presented today for surgery, with the diagnosis of afib.  The various methods of treatment have been discussed with the patient and family. After consideration of risks, benefits and other options for treatment, the patient has consented to  Procedure(s): CARDIOVERSION (N/A) as a surgical intervention.  The patient's history has been reviewed, patient examined, no change in status, stable for surgery.  I have reviewed the patient's chart and labs.  Questions were answered to the patient's satisfaction.     Chilton Si, MD

## 2023-02-14 NOTE — Discharge Summary (Signed)
Physician Discharge Summary  Brenda Matthews:[id number] DOB: 11/18/1935 DOA: 02/04/2023  PCP: Karna Dupes, MD  Admit date: 02/04/2023 Discharge date: 02/14/2023  Time spent: 40 minutes  Recommendations for Outpatient Follow-up:  Follow outpatient CBC/CMP  Follow renal function within 3-5 days of discharge - if renal function has normalized, would resume jardiance and torsemide (would dose this 20 mg once daily) Follow CXR outpatient after discharge Follow with neurology outpatient for new tremor and encephalopathy Follow with heme outpatient for new anemia If continued issues with urinary retention, needs urology follow up - attempting trial of void 1/28 PM Mild leukocytosis, follow outpatient   Auth pending 1/28 - medically ready for discharge, to oncoming hospitalist, please follow up renal function/CBC 1/29  Discharge Diagnoses:  Principal Problem:   Acute diastolic (congestive) heart failure (HCC) Active Problems:   Atrial fibrillation and flutter (HCC)   Discharge Condition: stable  Diet recommendation: heart healthy  Filed Weights   02/07/23 0900 02/08/23 0143 02/09/23 0455  Weight: 80.3 kg 77.2 kg 77.2 kg    History of present illness:   88 year old female with atrial fibrillation, diastolic heart failure who presents to the hospital with shortness of breath, orthopnea and pedal edema up to her thighs from her nursing facility. She presented on Brenda Matthews CPAP and was transition to Brenda Matthews BiPAP. Eventually was transitioned down to 4 L of oxygen.    Treated for HF exacerbation.  Hospitalization complicated by delirium and RVR.    She's improved with diuresis.  Now s/p cardioversion for afib/RVR.  Her delirium has essentially resolved.  She has AKI, but diuretics are currently on hold.  She's stable for discharge, we're currently awaiting insurance auth.   Hospital Course:  Assessment and Plan:  Acute Hypoxic Respiratory Failure Heart Failure with Preserved Ejection Fraction   Acute Diastolic Heart Failure O2 requirement improved, edema improved CXR 1/18 with findings c/w congestive HF (+ bibasilar atelectasis or consolidation) Transitioned to PO lasix per cardiology -> was on torsemide 20 mg BID, currently on hold with AKI - reeval in AM Jardiance currently on hold  Would follow up renal function once back to SNF, once back to normal, can restart jardiance and torsemide (would do once Brenda Matthews day torsemide rather than BID) Strict I/O, daily weights  Oxygen requirement Bilateral Pleural Effusions CXR 1/26 with increasing bilateral effusions CXR 1/27 with small effusion, slightly decreased On RA today Follow repeat CXR outpatient    Atrial fibrillation/flutter with RVR Amiodarone, metoprolol, eliquis S/p cardioversion 1/28 -> cards recommending amiodarone 200 mg BID x3 weeks, then 200 mg daily   Acute Metabolic Encephalopathy Delirium  Head CT limited, but no acute intracranial process noted She's improving, I think this is probably closer to her baselien - suspect this was hospital acquired delirium Gradual improvement She'll need neurology follow up outpatient Delirium precautions   Tremor Resting tremor, L>R, she notes this is new TSH wnl Neurology follow up would Matthews warranted Of note, amiodarone associated with enhanced physiologic tremor - I don't see other meds that could cause issue   AKI on CKD IIIb Holding jardiance and loop diuretic - repeat labs outpatient and consider resumption once renal function closer to normal Creatinine better than recent baseline   Urinary Retention Will attempt trial of void prior to discharge  Anemia Appears c/w AOCD, elevated b12, normal folate.  Normal iron/ferritin.  No signs of bleeding. Negative hemoccult.  Will monitor, transfuse for <7.  Hb drop noted in December.  Iron on low normal  side, supplement Family requesting heme referral, this is reasonable - will place at discharge   Hypertension Continue  metop -> increased dose D/c hydralazine   Dyslipidemia Lipitor   Neuropathic Pain Gabapentin   Obesity Body mass index is 32.16 kg/m.     Procedures:  1/28 cardioversion  Consultations: cardiology  Discharge Exam: Vitals:   02/14/23 1140 02/14/23 1327  BP:  (!) 114/49  Pulse:  70  Resp:    Temp: 98 F (36.7 C) 98 F (36.7 C)  SpO2:  94%   No complaints She's eager to discharge  General: No acute distress. Cardiovascular: RRR Lungs: Clear to auscultation bilaterally  Abdomen: Soft, nontender, nondistended  Neurological: Alert and oriented 3. Moves all extremities 4 with equal strength. Cranial nerves II through XII grossly intact. Extremities: some mild dependent edema to hips, but overall improved  Discharge Instructions   Discharge Instructions     Call MD for:  difficulty breathing, headache or visual disturbances   Complete by: As directed    Call MD for:  extreme fatigue   Complete by: As directed    Call MD for:  hives   Complete by: As directed    Call MD for:  persistant dizziness or light-headedness   Complete by: As directed    Call MD for:  persistant nausea and vomiting   Complete by: As directed    Call MD for:  redness, tenderness, or signs of infection (pain, swelling, redness, odor or green/yellow discharge around incision site)   Complete by: As directed    Call MD for:  severe uncontrolled pain   Complete by: As directed    Call MD for:  temperature >100.4   Complete by: As directed    Diet - low sodium heart healthy   Complete by: As directed    Discharge instructions   Complete by: As directed    You were seen for heart failure and atrial fibrillation with Brenda Matthews fast heart rate.  You've improved after diuresis and cardioversion.  Your hospitalization was complicated by confusion which I think was related to the hospitalization itself.    We'll send you home with amiodarone for your Brenda Matthews fib (you'll take it twice Brenda Matthews day for 3  weeks, then daily).  We've increased your metoprolol to 50 mg twice Brenda Matthews day.    We'll hold your jardiance for now.  We'll also hold on diuretics for now.  When your creatinine is back to normal, they can resume diuretics.    Return for new, recurrent, or worsening symptoms.  Please ask your PCP to request records from this hospitalization so they know what was done and what the next steps will Matthews.   Increase activity slowly   Complete by: As directed    No wound care   Complete by: As directed       Allergies as of 02/14/2023       Reactions   Augmentin [amoxicillin-pot Clavulanate] Nausea And Vomiting, Other (See Comments)   "projectile vomiting" Has patient had Brenda Matthews PCN reaction causing immediate rash, facial/tongue/throat swelling, SOB or lightheadedness with hypotension:No Has patient had Brenda Matthews PCN reaction causing severe rash involving mucus membranes or skin necrosis:No Has patient had Brenda Matthews PCN reaction that required hospitalization:No Has patient had Brenda Matthews PCN reaction occurring within the last 10 years:Yes If all of the above answers are "NO", then may proceed with Cephalosporin use.   Amoxicillin Rash   Clindamycin/lincomycin Rash        Medication List  PAUSE taking these medications    Jardiance 10 MG Tabs tablet Wait to take this until your doctor or other care provider tells you to start again. Resume this when your kidney function returns to normal (follow your doctor's instructions).  Generic drug: empagliflozin TAKE 1 TABLET BY MOUTH EVERY DAY BEFORE BREAKFAST       STOP taking these medications    furosemide 20 MG tablet Commonly known as: LASIX   hydrALAZINE 25 MG tablet Commonly known as: APRESOLINE       TAKE these medications    acetaminophen 500 MG tablet Commonly known as: TYLENOL Take 2 tablets (1,000 mg total) by mouth every 8 (eight) hours as needed. What changed: when to take this   albuterol 108 (90 Base) MCG/ACT inhaler Commonly known as:  VENTOLIN HFA INHALE 2 PUFFS BY MOUTH into THE lungs EVERY 4 HOURS AS NEEDED FOR WHEEZING OR SHORTNESS OF BREATH What changed:  how much to take how to take this when to take this reasons to take this additional instructions   ALPRAZolam 0.5 MG dissolvable tablet Commonly known as: NIRAVAM Take 0.5 mg by mouth every 8 (eight) hours as needed for anxiety.   amiodarone 200 MG tablet Commonly known as: PACERONE Take 1 tablet (200 mg total) by mouth 2 (two) times daily for 21 days, THEN 1 tablet (200 mg total) daily. Follow with cardiology for additional recommendations. Start taking on: February 14, 2023 What changed: See the new instructions.   apixaban 2.5 MG Tabs tablet Commonly known as: ELIQUIS Take 2.5 mg by mouth 2 (two) times daily.   atorvastatin 40 MG tablet Commonly known as: LIPITOR TAKE 1 TABLET BY MOUTH EVERY DAY   Biotin 5000 MCG Tabs Take 5,000 mcg by mouth daily.   CALCIUM 600 PO Take 1,200 mg by mouth daily.   carboxymethylcellulose 0.5 % Soln Commonly known as: REFRESH PLUS Place 2 drops into both eyes daily as needed (dry/irritated eyes.).   Centrum Silver Ultra Womens Tabs Take 1 tablet by mouth every evening.   Coenzyme Q10 200 MG capsule Take 200 mg by mouth daily.   EUCERIN ADVANCED REPAIR EX Apply 1 Application topically at bedtime.   ferrous sulfate 325 (65 FE) MG tablet Take 1 tablet (325 mg total) by mouth every other day. Start taking on: February 16, 2023   fluticasone 50 MCG/ACT nasal spray Commonly known as: FLONASE Place 2 sprays into both nostrils daily.   gabapentin 100 MG capsule Commonly known as: NEURONTIN Take 1 capsule (100 mg total) by mouth 3 (three) times daily. What changed: when to take this   hydrocortisone 2.5 % cream APPLY DAILY AS NEEDED TO RASH ON FEET AND LEGS   ipratropium-albuterol 0.5-2.5 (3) MG/3ML Soln Commonly known as: DUONEB Inhale 3 mLs into the lungs at bedtime.   Melatonin 5 MG Caps Take 5 mg by  mouth at bedtime.   metoprolol tartrate 50 MG tablet Commonly known as: LOPRESSOR Take 1 tablet (50 mg total) by mouth 2 (two) times daily. What changed:  medication strength how much to take   PREVAGEN PO Take 1 tablet by mouth daily.       Allergies  Allergen Reactions   Augmentin [Amoxicillin-Pot Clavulanate] Nausea And Vomiting and Other (See Comments)    "projectile vomiting" Has patient had Brenda Matthews PCN reaction causing immediate rash, facial/tongue/throat swelling, SOB or lightheadedness with hypotension:No Has patient had Kamyah Wilhelmsen PCN reaction causing severe rash involving mucus membranes or skin necrosis:No Has patient had Brenda Matthews PCN  reaction that required hospitalization:No Has patient had Brenda Matthews PCN reaction occurring within the last 10 years:Yes If all of the above answers are "NO", then may proceed with Cephalosporin use.    Amoxicillin Rash   Clindamycin/Lincomycin Rash    Contact information for after-discharge care     Destination     [facility] .   Service: Skilled Nursing Contact information: [address] [place] [address] [phone]                      The results of significant diagnostics from this hospitalization (including imaging, microbiology, ancillary and laboratory) are listed below for reference.    Significant Diagnostic Studies: EP STUDY Result Date: 02/14/2023 See surgical note for result.  DG Chest 2 View Result Date: 02/13/2023 CLINICAL DATA:  Follow-up pleural effusions EXAM: CHEST - 2 VIEW COMPARISON:  02/12/2023 FINDINGS: Cardiac shadow is enlarged but stable. Aortic calcifications are again seen. Previously seen small pleural effusions have reduced in size when compared with the prior exam. No focal infiltrate is seen. No bony abnormality is noted. IMPRESSION: Small effusions slightly decreased when compared with the previous day. Electronically Signed   By: Alcide Clever M.D.   On: 02/13/2023  22:46   DG CHEST PORT 1 VIEW Result Date: 02/12/2023 CLINICAL DATA:  Shortness of breath.  Pedal edema. EXAM: PORTABLE CHEST 1 VIEW COMPARISON:  02/09/2023 FINDINGS: Unchanged heart size and mediastinal contours. Aortic atherosclerosis. Increasing bilateral pleural effusions and bibasilar volume loss, typical of atelectasis. Slight vascular congestion without edema. No pneumothorax. IMPRESSION: 1. Increasing bilateral pleural effusions and bibasilar atelectasis. 2. Slight vascular congestion without edema. Electronically Signed   By: Narda Rutherford M.D.   On: 02/12/2023 18:35   DG Chest 2 View Result Date: 02/09/2023 CLINICAL DATA:  88 year old female "pneumonia". EXAM: CHEST - 2 VIEW COMPARISON:  Portable chest 02/04/2023 and earlier. FINDINGS: AP and lateral views at 0909 hours. Large lung volumes. Decreased but not resolved bilateral layering pleural effusions, now small and still larger on the left. Evidence of underlying cardiomegaly. Calcified aortic atherosclerosis. No pneumothorax or pulmonary edema. Pulmonary vascularity appears to Matthews at baseline, no overt edema. No pneumothorax. Visualized tracheal air column is within normal limits. Osteopenia. No acute osseous abnormality identified. Paucity of bowel gas. IMPRESSION: 1. Decreased but not resolved bilateral layering pleural effusions, still larger on the left. 2. No other acute cardiopulmonary abnormality. Cardiomegaly. Aortic Atherosclerosis (ICD10-I70.0). Electronically Signed   By: Odessa Fleming M.D.   On: 02/09/2023 09:19   CT HEAD WO CONTRAST ( ) Result Date: 02/07/2023 CLINICAL DATA:  Altered mental status, severe delirium EXAM: CT HEAD WITHOUT CONTRAST TECHNIQUE: Contiguous axial images were obtained from the base of the skull through the vertex without intravenous contrast. RADIATION DOSE REDUCTION: This exam was performed according to the departmental dose-optimization program which includes automated exposure control, adjustment of the mA  and/or kV according to patient size and/or use of iterative reconstruction technique. COMPARISON:  10/13/2017 FINDINGS: Evaluation is limited by motion. Brain: No evidence of acute infarction, hemorrhage, mass, mass effect, or midline shift. No hydrocephalus or extra-axial fluid collection. Periventricular white matter changes, likely the sequela of chronic small vessel ischemic disease. Age related cerebral atrophy. Vascular: No hyperdense vessel. Atherosclerotic calcifications in the intracranial carotid and vertebral arteries. Skull: Negative for fracture or focal lesion. Sinuses/Orbits: No acute finding. IMPRESSION: Evaluation is limited by motion. Within this limitation, no acute intracranial process. Electronically Signed   By:  Wiliam Ke M.D.   On: 02/07/2023 15:33   DG Chest Port 1 View Result Date: 02/04/2023 CLINICAL DATA:  88 year old female with history of sepsis. EXAM: PORTABLE CHEST 1 VIEW COMPARISON:  Chest x-ray 01/02/2023. FINDINGS: Lung volumes are low. Bibasilar opacities (left-greater-than-right) which may reflect areas of atelectasis and/or consolidation, with superimposed small right and moderate left pleural effusions. No pneumothorax. There is cephalization of the pulmonary vasculature and slight indistinctness of the interstitial markings suggestive of mild pulmonary edema. Mild cardiomegaly. The patient is rotated to the left on today's exam, resulting in distortion of the mediastinal contours and reduced diagnostic sensitivity and specificity for mediastinal pathology. Atherosclerotic calcifications are noted in the thoracic aorta. IMPRESSION: 1. The appearance the chest is most suggestive of congestive heart failure, as above. 2. Extensive bibasilar areas of atelectasis and/or consolidation. 3. Aortic atherosclerosis. Electronically Signed   By: Trudie Reed M.D.   On: 02/04/2023 06:36    Microbiology: Recent Results (from the past 240 hours)  Urine Culture (for pregnant,  neutropenic or urologic patients or patients with an indwelling urinary catheter)     Status: None   Collection Time: 02/09/23  4:21 PM   Specimen: Urine, Clean Catch  Result Value Ref Range Status   Specimen Description   Final    URINE, CLEAN CATCH Performed at [facility], [address], [place], [place] [address]    Special Requests   Final    NONE Performed at [facility], [address], [place], [place] [address]    Culture   Final    NO GROWTH Performed at [facility], [address] [place], [place] [address]    Report Status 02/10/2023 FINAL  Final     Labs: Basic Metabolic Panel: Recent Labs  Lab 02/09/23 0438 02/10/23 0732 02/11/23 0542 02/12/23 0528 02/13/23 0448 02/14/23 0517  NA 145 144 146* 143 145 138  K 3.9 4.3 3.8 3.9 3.7 3.8  CL 100 102 100 97* 98 96*  CO2 35* 32 36* 38* 34* 31  GLUCOSE 103* 117* 99 99 109* 105*  BUN 26* 24* 22 23 29* 33*  CREATININE 1.54* 1.35* 1.45* 1.41* 1.88* 2.17*  CALCIUM 9.2 9.1 9.2 9.4 9.6 8.6*  MG 2.3  --  2.4 2.6* 2.6* 2.4  PHOS 3.5  --  3.5 4.0 4.0 3.2   Liver Function Tests: Recent Labs  Lab 02/11/23 0542 02/14/23 0517  AST 26 32  ALT 20 22  ALKPHOS 71 83  BILITOT 1.1 1.0  PROT 5.8* 6.6  ALBUMIN 2.6* 2.9*   No results for input(s): "LIPASE", "AMYLASE" in the last 168 hours. No results for input(s): "AMMONIA" in the last 168 hours. CBC: Recent Labs  Lab 02/10/23 0732 02/11/23 0542 02/12/23 0528 02/13/23 0448 02/14/23 0517  WBC 7.3 7.5 8.2 9.5 13.9*  NEUTROABS  --  5.5  --   --  11.7*  HGB 7.9* 8.1* 8.4* 9.3* 8.6*  HCT 27.5* 27.6* 29.0* 32.6* 29.2*  MCV 97.5 97.2 97.3 98.2 95.4  PLT 181 191 209 267 245   Cardiac Enzymes: No results for input(s): "CKTOTAL", "CKMB", "CKMBINDEX", "TROPONINI" in the last 168 hours. BNP: BNP (last 3 results) Recent Labs    10/22/22 1721 01/02/23 0855 02/04/23 0555  BNP 176.7* 1,006.1* 546.8*    ProBNP (last 3  results) No results for input(s): "PROBNP" in the last 8760 hours.  CBG: Recent Labs  Lab 02/13/23 0722 02/13/23 1216 02/13/23 1709 02/13/23 2006  02/14/23 0731  GLUCAP 127* 123* 77 119* 97       Signed:  Lacretia Nicks MD.  [facility] 02/14/2023, 4:26 PM

## 2023-02-14 NOTE — Transfer of Care (Signed)
Immediate Anesthesia Transfer of Care Note  Patient: Brenda Matthews  Procedure(s) Performed: CARDIOVERSION  Patient Location: PACU and Cath Lab  Anesthesia Type:MAC  Level of Consciousness: awake, alert , and oriented  Airway & Oxygen Therapy: Patient Spontanous Breathing and Patient connected to nasal cannula oxygen  Post-op Assessment: Report given to RN and Post -op Vital signs reviewed and stable  Post vital signs: Reviewed and stable  Last Vitals:  Vitals Value Taken Time  BP    Temp    Pulse    Resp    SpO2      Last Pain:  Vitals:   02/14/23 1011  TempSrc: Temporal  PainSc:       Patients Stated Pain Goal: 0 (02/08/23 1141)  Complications: No notable events documented.

## 2023-02-14 NOTE — Progress Notes (Signed)
   Patient Name: Brenda Matthews Date of Encounter: 02/14/2023 [facility] Cardiologist: Chrystie Nose, MD   Interval Summary  .    Did well with the cardioversion. In NSR. No CV complaints. Complains of feet feeling "squishy", but there is no edema on exam.  Vital Signs .    Vitals:   02/14/23 0453 02/14/23 1011 02/14/23 1140 02/14/23 1327  BP: 122/73 138/81  (!) 114/49  Pulse: 84 (!) 111  70  Resp: 20 18    Temp: (!) 97.4 F (36.3 C) 98.1 F (36.7 C) 98 F (36.7 C) 98 F (36.7 C)  TempSrc: Oral Temporal Temporal Oral  SpO2: 96% 97%  94%  Weight:      Height:        Intake/Output Summary (Last 24 hours) at 02/14/2023 1330 Last data filed at 02/14/2023 0456 Gross per 24 hour  Intake 360 ml  Output 300 ml  Net 60 ml      02/09/2023    4:55 AM 02/08/2023    1:43 AM 02/07/2023    9:00 AM  Last 3 Weights  Weight (lbs) 170 lb 3.1 oz 170 lb 3.2 oz 177 lb 0.5 oz  Weight (kg) 77.2 kg 77.202 kg 80.3 kg      Telemetry/ECG    NSR - Personally Reviewed  Physical Exam .   GEN: No acute distress.   Neck: No JVD Cardiac: RRR, no murmurs, rubs, or gallops.  Respiratory: Clear to auscultation bilaterally. GI: Soft, nontender, non-distended  MS: No edema  Assessment & Plan .     Back in NSR. OK for DC from CV point of view. Note slight worsening of renal parameters. Furosemide stopped a couple of days ago.  [facility] will sign off.   Medication Recommendations:   Amiodarone 200 mg twice daily for 3 weeks, then 200 mg daily Apixaban 2.5 mg twice daily Atorvastatin 40 mg daily Metoprolol 50 mg twice daily (new dose) Jardiance 10 mg daily, to be restarted when renal function has returned to baseline. Hydralazine has been stopped Other recommendations (labs, testing, etc): Repeat basic metabolic panel to document return of kidney function to baseline Follow up as an outpatient: We will make arrangements for follow-up visit in 3-4 weeks     For  questions or updates, please contact [facility] Please consult [email or web address] for contact info under        Signed, Thurmon Fair, MD

## 2023-02-14 NOTE — Plan of Care (Signed)
  Problem: Education: Goal: Ability to describe self-care measures that may prevent or decrease complications (Diabetes Survival Skills Education) will improve Outcome: Progressing   Problem: Coping: Goal: Ability to adjust to condition or change in health will improve Outcome: Progressing   Problem: Skin Integrity: Goal: Risk for impaired skin integrity will decrease Outcome: Progressing

## 2023-02-14 NOTE — Anesthesia Postprocedure Evaluation (Signed)
Anesthesia Post Note  Patient: ADALYNA GODBEE  Procedure(s) Performed: CARDIOVERSION     Patient location during evaluation: PACU Anesthesia Type: General Level of consciousness: awake and alert Pain management: pain level controlled Vital Signs Assessment: post-procedure vital signs reviewed and stable Respiratory status: spontaneous breathing, nonlabored ventilation, respiratory function stable and patient connected to nasal cannula oxygen Cardiovascular status: blood pressure returned to baseline and stable Postop Assessment: no apparent nausea or vomiting Anesthetic complications: no   No notable events documented.  Last Vitals:  Vitals:   02/14/23 1327 02/14/23 1920  BP: (!) 114/49 (!) 117/52  Pulse: 70 72  Resp:  15  Temp: 36.7 C 36.5 C  SpO2: 94% 93%    Last Pain:  Vitals:   02/14/23 1920  TempSrc: Oral  PainSc:                  Taleeya Blondin

## 2023-02-14 NOTE — Anesthesia Postprocedure Evaluation (Signed)
Anesthesia Post Note  Patient: Brenda Matthews  Procedure(s) Performed: CARDIOVERSION     Patient location during evaluation: PACU Anesthesia Type: General Level of consciousness: awake and alert Pain management: pain level controlled Vital Signs Assessment: post-procedure vital signs reviewed and stable Respiratory status: spontaneous breathing, nonlabored ventilation, respiratory function stable and patient connected to nasal cannula oxygen Cardiovascular status: blood pressure returned to baseline and stable Postop Assessment: no apparent nausea or vomiting Anesthetic complications: no   No notable events documented.  Last Vitals:  Vitals:   02/14/23 1327 02/14/23 1920  BP: (!) 114/49 (!) 117/52  Pulse: 70 72  Resp:  15  Temp: 36.7 C 36.5 C  SpO2: 94% 93%    Last Pain:  Vitals:   02/14/23 1920  TempSrc: Oral  PainSc:                  Taleeya Blondin

## 2023-02-14 NOTE — TOC Progression Note (Addendum)
Transition of Care [facility]) - Progression Note    Patient Details  Name: Brenda Matthews MRN: [id number] Date of Birth: 08-Sep-1935  Transition of [facility]) CM/SW Contact  Mariaclara Spear, Olegario Messier, RN Phone Number: 02/14/2023, 11:17 AM  Clinical Narrative:initiated auth for [facility] await auth pending auth id #[id number].   -4:23p awaiting auth from [place] [facility] for [facility].     Expected Discharge Plan: Skilled Nursing Facility Barriers to Discharge: Insurance Authorization  Expected Discharge Plan and Services In-house Referral: Clinical Social Work     Living arrangements for the past 2 months: Independent Living Facility                                       Social Determinants of Health (SDOH) Interventions SDOH Screenings   Food Insecurity: No Food Insecurity (02/04/2023)  Housing: Low Risk  (02/04/2023)  Transportation Needs: No Transportation Needs (02/04/2023)  Utilities: Not At Risk (02/04/2023)  Alcohol Screen: Low Risk  (10/11/2020)  Depression (PHQ2-9): Low Risk  (09/01/2022)  Financial Resource Strain: Low Risk  (10/27/2021)  Physical Activity: Sufficiently Active (10/27/2021)  Social Connections: Moderately Integrated (02/04/2023)  Stress: No Stress Concern Present (10/27/2021)  Tobacco Use: Low Risk  (02/14/2023)    Readmission Risk Interventions    02/06/2023    2:09 PM 10/24/2022    9:08 AM  Readmission Risk Prevention Plan  Post Dischage Appt  Complete  Medication Screening  Complete  Transportation Screening Complete Complete  HRI or Home Care Consult Complete   Social Work Consult for Recovery Care Planning/Counseling Complete   Palliative Care Screening Not Applicable   Medication Review [employer]) Complete

## 2023-02-15 DIAGNOSIS — I5031 Acute diastolic (congestive) heart failure: Secondary | ICD-10-CM | POA: Diagnosis not present

## 2023-02-15 LAB — BASIC METABOLIC PANEL
Anion gap: 14 (ref 5–15)
BUN: 35 mg/dL — ABNORMAL HIGH (ref 8–23)
CO2: 31 mmol/L (ref 22–32)
Calcium: 8.7 mg/dL — ABNORMAL LOW (ref 8.9–10.3)
Chloride: 97 mmol/L — ABNORMAL LOW (ref 98–111)
Creatinine, Ser: 1.54 mg/dL — ABNORMAL HIGH (ref 0.44–1.00)
GFR, Estimated: 33 mL/min — ABNORMAL LOW (ref >60)
Glucose, Bld: 98 mg/dL (ref 70–99)
Potassium: 3.7 mmol/L (ref 3.5–5.1)
Sodium: 142 mmol/L (ref 135–145)

## 2023-02-15 LAB — GLUCOSE, CAPILLARY
Glucose-Capillary: 108 mg/dL — ABNORMAL HIGH (ref 70–99)
Glucose-Capillary: 105 mg/dL — ABNORMAL HIGH (ref 70–99)
Glucose-Capillary: 93 mg/dL (ref 70–99)
Glucose-Capillary: 119 mg/dL — ABNORMAL HIGH (ref 70–99)

## 2023-02-15 LAB — CBC
HCT: 26.2 % — ABNORMAL LOW (ref 36.0–46.0)
Hemoglobin: 7.7 g/dL — ABNORMAL LOW (ref 12.0–15.0)
MCH: 28.4 pg (ref 26.0–34.0)
MCHC: 29.4 g/dL — ABNORMAL LOW (ref 30.0–36.0)
MCV: 96.7 fL (ref 80.0–100.0)
Platelets: 207 K/uL (ref 150–400)
RBC: 2.71 MIL/uL — ABNORMAL LOW (ref 3.87–5.11)
RDW: 16.5 % — ABNORMAL HIGH (ref 11.5–15.5)
WBC: 11.6 K/uL — ABNORMAL HIGH (ref 4.0–10.5)
nRBC: 0 % (ref 0.0–0.2)

## 2023-02-15 MED ORDER — MELATONIN 5 MG PO TABS
5.0000 mg | ORAL_TABLET | Freq: Every evening | ORAL | Status: DC | PRN
  Administered 2023-02-15: 5 mg via ORAL
  Filled 2023-02-15: qty 1

## 2023-02-15 MED ORDER — TORSEMIDE 10 MG PO TABS: 20.0000 mg | ORAL_TABLET | Freq: Every day | ORAL | Status: AC

## 2023-02-15 NOTE — Progress Notes (Signed)
Physical Therapy Treatment Patient Details Name: Brenda Matthews MRN: [id number] DOB: 1935-03-19 Today's Date: 02/15/2023   History of Present Illness Brenda Matthews is a 88 y.o. female admitted 02/04/23 for acute diastolic congestive heart failure. Of note, pt recently hospitalized 01/02/23 - 01/14/23 for right lower extremity cellulitis and heart failure decompensation. PMH: chronic renal insufficiency, chronic anemia, non-insulin-dependent diabetes, hyperlipidemia, HTN, afib, CHF, osteopenia    PT Comments  Pt highly anxious and upset. She states she expected to be discharged already. She didn't order lunch because she thought she was leaving this morning. I assisted her with ordering something to eat. She agreed to work with me. She was Min A on today. She walked ~20 feet with a RW. O2: 92% on 1L [place] O2/ 89% on RA with ambulation. Patient will benefit from continued inpatient follow up therapy, <3 hours/day before returning to her ILF. Marland Kitchen At pt's request, I send a secure chat to RN and CM regarding her concern.    If plan is discharge home, recommend the following: A little help with walking and/or transfers;A little help with bathing/dressing/bathroom;Assistance with cooking/housework;Assist for transportation;Help with stairs or ramp for entrance   Can travel by private vehicle        Equipment Recommendations  None recommended by PT    Recommendations for Other Services       Precautions / Restrictions Precautions Precautions: Fall Precaution Comments: monitor vitals Restrictions Weight Bearing Restrictions Per Provider Order: No     Mobility  Bed Mobility               General bed mobility comments: oob in recliner    Transfers Overall transfer level: Needs assistance Equipment used: Rolling walker (2 wheels) Transfers: Sit to/from Stand Sit to Stand: Min assist           General transfer comment: Cues for safety. Assist to rise, steady, control descent.     Ambulation/Gait Ambulation/Gait assistance: Min assist Gait Distance (Feet): 20 Feet Assistive device: Rolling walker (2 wheels) Gait Pattern/deviations: Step-through pattern, Decreased stride length       General Gait Details: Took laps in room-pt declined hallway ambulation. O2 92% 1L, 89% on RA with ambulation. Pt tolerated activity well.   Stairs             Wheelchair Mobility     Tilt Bed    Modified Rankin (Stroke Patients Only)       Balance Overall balance assessment: Needs assistance         Standing balance support: Reliant on assistive device for balance, Bilateral upper extremity supported, During functional activity Standing balance-Leahy Scale: Poor                              Cognition Arousal: Alert Behavior During Therapy: Anxious (about plan/discharge) Overall Cognitive Status: No family/caregiver present to determine baseline cognitive functioning                                 General Comments: followed commands well. internally distracted-fixated on discharge and why she hasn't left yet        Exercises      General Comments        Pertinent Vitals/Pain Pain Assessment Pain Assessment: No/denies pain    Home Living  Prior Function            PT Goals (current goals can now be found in the care plan section) Progress towards PT goals: Progressing toward goals    Frequency    Min 1X/week      PT Plan      Co-evaluation              AM-PAC PT "6 Clicks" Mobility   Outcome Measure  Help needed turning from your back to your side while in a flat bed without using bedrails?: A Little Help needed moving from lying on your back to sitting on the side of a flat bed without using bedrails?: A Lot Help needed moving to and from a bed to a chair (including a wheelchair)?: A Little Help needed standing up from a chair using your arms (e.g.,  wheelchair or bedside chair)?: A Little Help needed to walk in hospital room?: A Little Help needed climbing 3-5 steps with a railing? : A Lot 6 Click Score: 16    End of Session Equipment Utilized During Treatment: Gait belt;Oxygen Activity Tolerance: Patient tolerated treatment well Patient left: in chair;with call bell/phone within reach;with chair alarm set   PT Visit Diagnosis: Unsteadiness on feet (R26.81);Muscle weakness (generalized) (M62.81)     Time: 1610-9604 PT Time Calculation (min) (ACUTE ONLY): 15 min  Charges:    $Gait Training: 8-22 mins PT General Charges $$ ACUTE PT VISIT: 1 Visit                        Faye Ramsay, PT Acute Rehabilitation  Office: [phone]

## 2023-02-15 NOTE — Discharge Summary (Addendum)
Physician Discharge Summary  TAURIEL SCRONCE NWG:[id number] DOB: [birth year] DOA: 02/04/2023  PCP: Karna Dupes, MD  Admit date: 02/04/2023 Discharge date: 02/15/2023  Time spent: 40 minutes  Recommendations for Outpatient Follow-up:  Follow outpatient CBC/CMP  Follow renal function within 3 days of discharge - if renal function has normalized, would resume jardiance Follow CXR outpatient after discharge Follow with neurology outpatient for new tremor and encephalopathy Follow with heme outpatient for new anemia If continued issues with urinary retention, needs urology follow up  Mild leukocytosis, follow outpatient   Discharge Diagnoses:  Principal Problem:   Acute diastolic (congestive) heart failure (HCC) Active Problems:   Atrial fibrillation and flutter (HCC)   Discharge Condition: stable  Diet recommendation: heart healthy  Filed Weights   02/07/23 0900 02/08/23 0143 02/09/23 0455  Weight: 80.3 kg 77.2 kg 77.2 kg    History of present illness:   88 year old female with atrial fibrillation, diastolic heart failure who presents to the hospital with shortness of breath, orthopnea and pedal edema up to her thighs from her nursing facility. She presented on a CPAP and was transition to a BiPAP. Eventually was transitioned down to 4 L of oxygen.  Treated for HF exacerbation.  Hospitalization complicated by delirium and RVR.  She's improved with diuresis.  Now s/p cardioversion for afib/RVR.  Her delirium has essentially resolved.    Hospital Course:  Assessment and Plan:  Principal problem Acute Hypoxic Respiratory Failure due to acute on chronic diastolic CHF -chest x-ray on admission with finding concerning for CHF.  Cardiology consulted and followed patient while hospitalized.  She was initially diuresed with IV Lasix, then transition to p.o. diuretics, however temporarily held due to AKI.  Now renal function is improved, resume oral diuretics.  Recommend daily weights,  low-sodium diet and ongoing evaluation regarding fluid status with potential further adjustments of her diuretics on her weight as well as renal function. Follow-up renal function in 3 days once back to SNF, can restart Jardiance if stable  Active problems Atrial fibrillation/flutter with RVR - Amiodarone, metoprolol, eliquis.  Cardiology consulted, she is status post cardioversion 1/28.  Cardiology recommends amiodarone 200 mg BID x's, then 200 mg daily maintaining sinus rhythm Acute Metabolic Encephalopathy, hospital induced delirium - Head CT limited, but no acute intracranial process noted.  This is improved.  May need neurology follow-up as an outpatient if recurs Tremor - Resting tremor, L>R, she notes this is new, TSH wnl, may need neurology follow-up as an outpatient on nonemergent basis AKI on CKD IIIb - Holding jardiance still.  Baseline creatinine ranging between 1.1 and 1.5, currently at baseline Urinary Retention -catheter removed  Anemia - Appears consistent with an may have chronic disease, elevated B12, normal folate.  No signs of bleeding, negative Hemoccult.  Heme referral placed on discharge by Dr. Lowell Guitar.  Continue iron Hypertension - Continue metoprolol, d/c hydralazine Dyslipidemia - Lipitor Neuropathic Pain - Gabapentin Obesity - Body mass index is 32.16 kg/m.  Procedures:  1/28 cardioversion  Consultations: cardiology  Discharge Exam: Vitals:   02/15/23 0312 02/15/23 1203  BP: (!) 119/55 123/61  Pulse: 60 (!) 58  Resp: 12 20  Temp: 98.2 F (36.8 C) 97.9 F (36.6 C)  SpO2: 100% 100%   No complaints She's eager to discharge  General: No acute distress. Cardiovascular: RRR Lungs: Clear to auscultation bilaterally  Abdomen: Soft, nontender, nondistended  Neurological: Alert and oriented 3. Moves all extremities 4 with equal strength. Cranial nerves II through XII grossly intact.  Extremities: some mild dependent edema to hips, but overall  improved  Discharge Instructions   Discharge Instructions     Ambulatory referral to Hematology / Oncology   Complete by: As directed    Ambulatory referral to Neurology   Complete by: As directed    An appointment is requested in approximately: 4 weeks   Call MD for:  difficulty breathing, headache or visual disturbances   Complete by: As directed    Call MD for:  extreme fatigue   Complete by: As directed    Call MD for:  hives   Complete by: As directed    Call MD for:  persistant dizziness or light-headedness   Complete by: As directed    Call MD for:  persistant nausea and vomiting   Complete by: As directed    Call MD for:  redness, tenderness, or signs of infection (pain, swelling, redness, odor or green/yellow discharge around incision site)   Complete by: As directed    Call MD for:  severe uncontrolled pain   Complete by: As directed    Call MD for:  temperature >100.4   Complete by: As directed    Diet - low sodium heart healthy   Complete by: As directed    Discharge instructions   Complete by: As directed    You were seen for heart failure and atrial fibrillation with a fast heart rate.  You've improved after diuresis and cardioversion.  Your hospitalization was complicated by confusion which I think was related to the hospitalization itself.    We'll send you home with amiodarone for your a fib (you'll take it twice a day for 3 weeks, then daily).  We've increased your metoprolol to 50 mg twice a day.    We'll hold your jardiance for now.  We'll also hold on diuretics for now.  When your creatinine is back to normal, they can resume diuretics.    Return for new, recurrent, or worsening symptoms.  Please ask your PCP to request records from this hospitalization so they know what was done and what the next steps will be.   Increase activity slowly   Complete by: As directed    No wound care   Complete by: As directed       Allergies as of 02/15/2023        Reactions   Augmentin [amoxicillin-pot Clavulanate] Nausea And Vomiting, Other (See Comments)   "projectile vomiting" Has patient had a PCN reaction causing immediate rash, facial/tongue/throat swelling, SOB or lightheadedness with hypotension:No Has patient had a PCN reaction causing severe rash involving mucus membranes or skin necrosis:No Has patient had a PCN reaction that required hospitalization:No Has patient had a PCN reaction occurring within the last 10 years:Yes If all of the above answers are "NO", then may proceed with Cephalosporin use.   Amoxicillin Rash   Clindamycin/lincomycin Rash        Medication List     PAUSE taking these medications    Jardiance 10 MG Tabs tablet Wait to take this until your doctor or other care provider tells you to start again. Resume this when your kidney function returns to normal (follow your doctor's instructions).  Generic drug: empagliflozin TAKE 1 TABLET BY MOUTH EVERY DAY BEFORE BREAKFAST       STOP taking these medications    furosemide 20 MG tablet Commonly known as: LASIX   hydrALAZINE 25 MG tablet Commonly known as: APRESOLINE       TAKE these medications  acetaminophen 500 MG tablet Commonly known as: TYLENOL Take 2 tablets (1,000 mg total) by mouth every 8 (eight) hours as needed. What changed: when to take this   albuterol 108 (90 Base) MCG/ACT inhaler Commonly known as: VENTOLIN HFA INHALE 2 PUFFS BY MOUTH into THE lungs EVERY 4 HOURS AS NEEDED FOR WHEEZING OR SHORTNESS OF BREATH What changed:  how much to take how to take this when to take this reasons to take this additional instructions   ALPRAZolam 0.5 MG dissolvable tablet Commonly known as: NIRAVAM Take 0.5 mg by mouth every 8 (eight) hours as needed for anxiety.   amiodarone 200 MG tablet Commonly known as: PACERONE Take 1 tablet (200 mg total) by mouth 2 (two) times daily for 21 days, THEN 1 tablet (200 mg total) daily. Follow with  cardiology for additional recommendations. Start taking on: February 14, 2023 What changed: See the new instructions.   apixaban 2.5 MG Tabs tablet Commonly known as: ELIQUIS Take 2.5 mg by mouth 2 (two) times daily.   atorvastatin 40 MG tablet Commonly known as: LIPITOR TAKE 1 TABLET BY MOUTH EVERY DAY   Biotin 5000 MCG Tabs Take 5,000 mcg by mouth daily.   CALCIUM 600 PO Take 1,200 mg by mouth daily.   carboxymethylcellulose 0.5 % Soln Commonly known as: REFRESH PLUS Place 2 drops into both eyes daily as needed (dry/irritated eyes.).   Centrum Silver Ultra Womens Tabs Take 1 tablet by mouth every evening.   Coenzyme Q10 200 MG capsule Take 200 mg by mouth daily.   EUCERIN ADVANCED REPAIR EX Apply 1 Application topically at bedtime.   ferrous sulfate 325 (65 FE) MG tablet Take 1 tablet (325 mg total) by mouth every other day. Start taking on: February 16, 2023   fluticasone 50 MCG/ACT nasal spray Commonly known as: FLONASE Place 2 sprays into both nostrils daily.   gabapentin 100 MG capsule Commonly known as: NEURONTIN Take 1 capsule (100 mg total) by mouth 3 (three) times daily. What changed: when to take this   hydrocortisone 2.5 % cream APPLY DAILY AS NEEDED TO RASH ON FEET AND LEGS   ipratropium-albuterol 0.5-2.5 (3) MG/3ML Soln Commonly known as: DUONEB Inhale 3 mLs into the lungs at bedtime.   Melatonin 5 MG Caps Take 5 mg by mouth at bedtime.   metoprolol tartrate 50 MG tablet Commonly known as: LOPRESSOR Take 1 tablet (50 mg total) by mouth 2 (two) times daily. What changed:  medication strength how much to take   PREVAGEN PO Take 1 tablet by mouth daily.   torsemide 10 MG tablet Commonly known as: DEMADEX Take 2 tablets (20 mg total) by mouth daily.       Allergies  Allergen Reactions   Augmentin [Amoxicillin-Pot Clavulanate] Nausea And Vomiting and Other (See Comments)    "projectile vomiting" Has patient had a PCN reaction causing  immediate rash, facial/tongue/throat swelling, SOB or lightheadedness with hypotension:No Has patient had a PCN reaction causing severe rash involving mucus membranes or skin necrosis:No Has patient had a PCN reaction that required hospitalization:No Has patient had a PCN reaction occurring within the last 10 years:Yes If all of the above answers are "NO", then may proceed with Cephalosporin use.    Amoxicillin Rash   Clindamycin/Lincomycin Rash    Contact information for after-discharge care     Destination     [facility] .   Service: Skilled Nursing Contact information: [address] [place] [address] [phone]  The results of significant diagnostics from this hospitalization (including imaging, microbiology, ancillary and laboratory) are listed below for reference.    Significant Diagnostic Studies: EP STUDY Result Date: 02/14/2023 See surgical note for result.  DG Chest 2 View Result Date: 02/13/2023 CLINICAL DATA:  Follow-up pleural effusions EXAM: CHEST - 2 VIEW COMPARISON:  02/12/2023 FINDINGS: Cardiac shadow is enlarged but stable. Aortic calcifications are again seen. Previously seen small pleural effusions have reduced in size when compared with the prior exam. No focal infiltrate is seen. No bony abnormality is noted. IMPRESSION: Small effusions slightly decreased when compared with the previous day. Electronically Signed   By: Alcide Clever M.D.   On: 02/13/2023 22:46   DG CHEST PORT 1 VIEW Result Date: 02/12/2023 CLINICAL DATA:  Shortness of breath.  Pedal edema. EXAM: PORTABLE CHEST 1 VIEW COMPARISON:  02/09/2023 FINDINGS: Unchanged heart size and mediastinal contours. Aortic atherosclerosis. Increasing bilateral pleural effusions and bibasilar volume loss, typical of atelectasis. Slight vascular congestion without edema. No pneumothorax. IMPRESSION: 1. Increasing bilateral pleural effusions  and bibasilar atelectasis. 2. Slight vascular congestion without edema. Electronically Signed   By: Narda Rutherford M.D.   On: 02/12/2023 18:35   DG Chest 2 View Result Date: 02/09/2023 CLINICAL DATA:  88 year old female "pneumonia". EXAM: CHEST - 2 VIEW COMPARISON:  Portable chest 02/04/2023 and earlier. FINDINGS: AP and lateral views at 0909 hours. Large lung volumes. Decreased but not resolved bilateral layering pleural effusions, now small and still larger on the left. Evidence of underlying cardiomegaly. Calcified aortic atherosclerosis. No pneumothorax or pulmonary edema. Pulmonary vascularity appears to be at baseline, no overt edema. No pneumothorax. Visualized tracheal air column is within normal limits. Osteopenia. No acute osseous abnormality identified. Paucity of bowel gas. IMPRESSION: 1. Decreased but not resolved bilateral layering pleural effusions, still larger on the left. 2. No other acute cardiopulmonary abnormality. Cardiomegaly. Aortic Atherosclerosis (ICD10-I70.0). Electronically Signed   By: Odessa Fleming M.D.   On: 02/09/2023 09:19   CT HEAD WO CONTRAST ( ) Result Date: 02/07/2023 CLINICAL DATA:  Altered mental status, severe delirium EXAM: CT HEAD WITHOUT CONTRAST TECHNIQUE: Contiguous axial images were obtained from the base of the skull through the vertex without intravenous contrast. RADIATION DOSE REDUCTION: This exam was performed according to the departmental dose-optimization program which includes automated exposure control, adjustment of the mA and/or kV according to patient size and/or use of iterative reconstruction technique. COMPARISON:  10/13/2017 FINDINGS: Evaluation is limited by motion. Brain: No evidence of acute infarction, hemorrhage, mass, mass effect, or midline shift. No hydrocephalus or extra-axial fluid collection. Periventricular white matter changes, likely the sequela of chronic small vessel ischemic disease. Age related cerebral atrophy. Vascular: No  hyperdense vessel. Atherosclerotic calcifications in the intracranial carotid and vertebral arteries. Skull: Negative for fracture or focal lesion. Sinuses/Orbits: No acute finding. IMPRESSION: Evaluation is limited by motion. Within this limitation, no acute intracranial process. Electronically Signed   By: Wiliam Ke M.D.   On: 02/07/2023 15:33   DG Chest Port 1 View Result Date: 02/04/2023 CLINICAL DATA:  88 year old female with history of sepsis. EXAM: PORTABLE CHEST 1 VIEW COMPARISON:  Chest x-ray 01/02/2023. FINDINGS: Lung volumes are low. Bibasilar opacities (left-greater-than-right) which may reflect areas of atelectasis and/or consolidation, with superimposed small right and moderate left pleural effusions. No pneumothorax. There is cephalization of the pulmonary vasculature and slight indistinctness of the interstitial markings suggestive of mild pulmonary edema. Mild cardiomegaly. The patient is rotated to the left on today's exam, resulting in distortion  of the mediastinal contours and reduced diagnostic sensitivity and specificity for mediastinal pathology. Atherosclerotic calcifications are noted in the thoracic aorta. IMPRESSION: 1. The appearance the chest is most suggestive of congestive heart failure, as above. 2. Extensive bibasilar areas of atelectasis and/or consolidation. 3. Aortic atherosclerosis. Electronically Signed   By: Trudie Reed M.D.   On: 02/04/2023 06:36    Microbiology: Recent Results (from the past 240 hours)  Urine Culture (for pregnant, neutropenic or urologic patients or patients with an indwelling urinary catheter)     Status: None   Collection Time: 02/09/23  4:21 PM   Specimen: Urine, Clean Catch  Result Value Ref Range Status   Specimen Description   Final    URINE, CLEAN CATCH Performed at [facility], [address], [place], [place] [address]    Special Requests   Final    NONE Performed at [facility], [address], [place], [place] [address]    Culture   Final    NO GROWTH Performed at [facility], [address], [place], [place] [address]    Report Status 02/10/2023 FINAL  Final     Labs: Basic Metabolic Panel: Recent Labs  Lab 02/09/23 0438 02/10/23 0732 02/11/23 0542 02/12/23 0528 02/13/23 0448 02/14/23 0517 02/15/23 0533  NA 145   < > 146* 143 145 138 142  K 3.9   < > 3.8 3.9 3.7 3.8 3.7  CL 100   < > 100 97* 98 96* 97*  CO2 35*   < > 36* 38* 34* 31 31  GLUCOSE 103*   < > 99 99 109* 105* 98  BUN 26*   < > 22 23 29* 33* 35*  CREATININE 1.54*   < > 1.45* 1.41* 1.88* 2.17* 1.54*  CALCIUM 9.2   < > 9.2 9.4 9.6 8.6* 8.7*  MG 2.3  --  2.4 2.6* 2.6* 2.4  --   PHOS 3.5  --  3.5 4.0 4.0 3.2  --    < > = values in this interval not displayed.   Liver Function Tests: Recent Labs  Lab 02/11/23 0542 02/14/23 0517  AST 26 32  ALT 20 22  ALKPHOS 71 83  BILITOT 1.1 1.0  PROT 5.8* 6.6  ALBUMIN 2.6* 2.9*   No results for input(s): "LIPASE", "AMYLASE" in the last 168 hours. No results for input(s): "AMMONIA" in the last 168 hours. CBC: Recent Labs  Lab 02/11/23 0542 02/12/23 0528 02/13/23 0448 02/14/23 0517 02/15/23 0533  WBC 7.5 8.2 9.5 13.9* 11.6*  NEUTROABS 5.5  --   --  11.7*  --   HGB 8.1* 8.4* 9.3* 8.6* 7.7*  HCT 27.6* 29.0* 32.6* 29.2* 26.2*  MCV 97.2 97.3 98.2 95.4 96.7  PLT 191 209 267 245 207   Cardiac Enzymes: No results for input(s): "CKTOTAL", "CKMB", "CKMBINDEX", "TROPONINI" in the last 168 hours. BNP: BNP (last 3 results) Recent Labs    10/22/22 1721 01/02/23 0855 02/04/23 0555  BNP 176.7* 1,006.1* 546.8*    ProBNP (last 3 results) No results for input(s): "PROBNP" in the last 8760 hours.  CBG: Recent Labs  Lab 02/14/23 0731 02/14/23 1627 02/14/23 1955 02/15/23 0720 02/15/23 1119  GLUCAP 97 140* 80 108* 105*    Time spent: > 30 min    Signed:  Pamella Pert MD.  [facility] 02/15/2023, 2:35 PM

## 2023-02-15 NOTE — TOC Progression Note (Addendum)
Transition of Care [facility]) - Progression Note    Patient Details  Name: Brenda Matthews MRN: [id number] Date of Birth: [birth year]  [facility] CM/SW Contact  Briannia Laba, Olegario Messier, RN Phone Number: 02/15/2023, 9:50 AM  Clinical Narrative: Insurance auth pending for [facility] [employer] prior d/c. [facility] rep Dru has bed available.  -2:14p Peer to peer offered from [facility] due by 9:30a tomorrow-they are open till 8p tonight call [phone] option 5, Rusty, [place] 2035-07-12 [id number] for [facility]. -3:29p-received [place] plan VQ#[phone] for [facility] rep Dru aware-going to [id number],report tel#[phone]. PTAR called. No further CM needs.    Expected Discharge Plan: Skilled Nursing Facility Barriers to Discharge: Insurance Authorization  Expected Discharge Plan and Services In-house Referral: Clinical Social Work     Living arrangements for the past 2 months: Independent Living Facility Expected Discharge Date: 02/14/23                                     Social Determinants of Health (SDOH) Interventions SDOH Screenings   Food Insecurity: No Food Insecurity (02/04/2023)  Housing: Low Risk  (02/04/2023)  Transportation Needs: No Transportation Needs (02/04/2023)  Utilities: Not At Risk (02/04/2023)  Alcohol Screen: Low Risk  (10/11/2020)  Depression (PHQ2-9): Low Risk  (09/01/2022)  Financial Resource Strain: Low Risk  (10/27/2021)  Physical Activity: Sufficiently Active (10/27/2021)  Social Connections: Moderately Integrated (02/04/2023)  Stress: No Stress Concern Present (10/27/2021)  Tobacco Use: Low Risk  (02/14/2023)    Readmission Risk Interventions    02/06/2023    2:09 PM 10/24/2022    9:08 AM  Readmission Risk Prevention Plan  Post Dischage Appt  Complete  Medication Screening  Complete  Transportation Screening Complete Complete  HRI or Home Care Consult Complete   Social Work Consult for Recovery Care  Planning/Counseling Complete   Palliative Care Screening Not Applicable   Medication Review Oceanographer) Complete

## 2023-02-15 NOTE — Progress Notes (Signed)
Report given to receiving facility. Await PTAR.

## 2023-02-16 DIAGNOSIS — I519 Heart disease, unspecified: Secondary | ICD-10-CM | POA: Diagnosis not present

## 2023-02-16 DIAGNOSIS — Z66 Do not resuscitate: Secondary | ICD-10-CM | POA: Diagnosis present

## 2023-02-16 DIAGNOSIS — E1122 Type 2 diabetes mellitus with diabetic chronic kidney disease: Secondary | ICD-10-CM | POA: Diagnosis present

## 2023-02-16 DIAGNOSIS — I071 Rheumatic tricuspid insufficiency: Secondary | ICD-10-CM | POA: Diagnosis present

## 2023-02-16 DIAGNOSIS — R41841 Cognitive communication deficit: Secondary | ICD-10-CM | POA: Diagnosis not present

## 2023-02-16 DIAGNOSIS — R2689 Other abnormalities of gait and mobility: Secondary | ICD-10-CM | POA: Diagnosis not present

## 2023-02-16 DIAGNOSIS — J9600 Acute respiratory failure, unspecified whether with hypoxia or hypercapnia: Secondary | ICD-10-CM | POA: Diagnosis not present

## 2023-02-16 DIAGNOSIS — Z515 Encounter for palliative care: Secondary | ICD-10-CM | POA: Diagnosis not present

## 2023-02-16 DIAGNOSIS — I4891 Unspecified atrial fibrillation: Secondary | ICD-10-CM | POA: Diagnosis not present

## 2023-02-16 DIAGNOSIS — N183 Chronic kidney disease, stage 3 unspecified: Secondary | ICD-10-CM | POA: Diagnosis not present

## 2023-02-16 DIAGNOSIS — J9690 Respiratory failure, unspecified, unspecified whether with hypoxia or hypercapnia: Secondary | ICD-10-CM | POA: Diagnosis not present

## 2023-02-16 DIAGNOSIS — F32A Depression, unspecified: Secondary | ICD-10-CM | POA: Diagnosis present

## 2023-02-16 DIAGNOSIS — I480 Paroxysmal atrial fibrillation: Secondary | ICD-10-CM | POA: Diagnosis present

## 2023-02-16 DIAGNOSIS — M7989 Other specified soft tissue disorders: Secondary | ICD-10-CM | POA: Diagnosis not present

## 2023-02-16 DIAGNOSIS — N189 Chronic kidney disease, unspecified: Secondary | ICD-10-CM | POA: Diagnosis not present

## 2023-02-16 DIAGNOSIS — D631 Anemia in chronic kidney disease: Secondary | ICD-10-CM | POA: Diagnosis present

## 2023-02-16 DIAGNOSIS — E860 Dehydration: Secondary | ICD-10-CM | POA: Diagnosis present

## 2023-02-16 DIAGNOSIS — I4892 Unspecified atrial flutter: Secondary | ICD-10-CM | POA: Diagnosis not present

## 2023-02-16 DIAGNOSIS — L03115 Cellulitis of right lower limb: Secondary | ICD-10-CM | POA: Diagnosis present

## 2023-02-16 DIAGNOSIS — R0989 Other specified symptoms and signs involving the circulatory and respiratory systems: Secondary | ICD-10-CM | POA: Diagnosis not present

## 2023-02-16 DIAGNOSIS — J90 Pleural effusion, not elsewhere classified: Secondary | ICD-10-CM | POA: Diagnosis present

## 2023-02-16 DIAGNOSIS — R2681 Unsteadiness on feet: Secondary | ICD-10-CM | POA: Diagnosis not present

## 2023-02-16 DIAGNOSIS — I371 Nonrheumatic pulmonary valve insufficiency: Secondary | ICD-10-CM | POA: Diagnosis not present

## 2023-02-16 DIAGNOSIS — I5084 End stage heart failure: Secondary | ICD-10-CM | POA: Diagnosis present

## 2023-02-16 DIAGNOSIS — M6281 Muscle weakness (generalized): Secondary | ICD-10-CM | POA: Diagnosis not present

## 2023-02-16 DIAGNOSIS — N179 Acute kidney failure, unspecified: Secondary | ICD-10-CM | POA: Diagnosis not present

## 2023-02-16 DIAGNOSIS — R2990 Unspecified symptoms and signs involving the nervous system: Secondary | ICD-10-CM | POA: Diagnosis not present

## 2023-02-16 DIAGNOSIS — E8881 Metabolic syndrome: Secondary | ICD-10-CM | POA: Diagnosis not present

## 2023-02-16 DIAGNOSIS — R6520 Severe sepsis without septic shock: Secondary | ICD-10-CM | POA: Diagnosis present

## 2023-02-16 DIAGNOSIS — R531 Weakness: Secondary | ICD-10-CM | POA: Diagnosis not present

## 2023-02-16 DIAGNOSIS — I130 Hypertensive heart and chronic kidney disease with heart failure and stage 1 through stage 4 chronic kidney disease, or unspecified chronic kidney disease: Secondary | ICD-10-CM | POA: Diagnosis present

## 2023-02-16 DIAGNOSIS — J9601 Acute respiratory failure with hypoxia: Secondary | ICD-10-CM | POA: Diagnosis present

## 2023-02-16 DIAGNOSIS — E861 Hypovolemia: Secondary | ICD-10-CM | POA: Diagnosis not present

## 2023-02-16 DIAGNOSIS — M51369 Other intervertebral disc degeneration, lumbar region without mention of lumbar back pain or lower extremity pain: Secondary | ICD-10-CM | POA: Diagnosis present

## 2023-02-16 DIAGNOSIS — I509 Heart failure, unspecified: Secondary | ICD-10-CM | POA: Diagnosis not present

## 2023-02-16 DIAGNOSIS — I5033 Acute on chronic diastolic (congestive) heart failure: Secondary | ICD-10-CM | POA: Diagnosis not present

## 2023-02-16 DIAGNOSIS — L0390 Cellulitis, unspecified: Secondary | ICD-10-CM | POA: Diagnosis not present

## 2023-02-16 DIAGNOSIS — I959 Hypotension, unspecified: Secondary | ICD-10-CM | POA: Diagnosis not present

## 2023-02-16 DIAGNOSIS — I2721 Secondary pulmonary arterial hypertension: Secondary | ICD-10-CM | POA: Diagnosis present

## 2023-02-16 DIAGNOSIS — I5032 Chronic diastolic (congestive) heart failure: Secondary | ICD-10-CM | POA: Diagnosis not present

## 2023-02-16 DIAGNOSIS — N1832 Chronic kidney disease, stage 3b: Secondary | ICD-10-CM | POA: Diagnosis present

## 2023-02-16 DIAGNOSIS — N170 Acute kidney failure with tubular necrosis: Secondary | ICD-10-CM | POA: Diagnosis present

## 2023-02-16 DIAGNOSIS — F05 Delirium due to known physiological condition: Secondary | ICD-10-CM | POA: Diagnosis present

## 2023-02-16 DIAGNOSIS — L03116 Cellulitis of left lower limb: Secondary | ICD-10-CM | POA: Diagnosis present

## 2023-02-16 DIAGNOSIS — Z1152 Encounter for screening for COVID-19: Secondary | ICD-10-CM | POA: Diagnosis not present

## 2023-02-16 DIAGNOSIS — I77810 Thoracic aortic ectasia: Secondary | ICD-10-CM | POA: Diagnosis present

## 2023-02-16 DIAGNOSIS — A419 Sepsis, unspecified organism: Secondary | ICD-10-CM | POA: Diagnosis present

## 2023-02-20 DIAGNOSIS — N189 Chronic kidney disease, unspecified: Secondary | ICD-10-CM | POA: Diagnosis not present

## 2023-02-20 DIAGNOSIS — L03116 Cellulitis of left lower limb: Secondary | ICD-10-CM | POA: Diagnosis not present

## 2023-02-20 DIAGNOSIS — L03115 Cellulitis of right lower limb: Secondary | ICD-10-CM | POA: Diagnosis not present

## 2023-02-22 ENCOUNTER — Telehealth: Payer: Self-pay | Admitting: Internal Medicine

## 2023-02-22 DIAGNOSIS — N189 Chronic kidney disease, unspecified: Secondary | ICD-10-CM | POA: Diagnosis not present

## 2023-02-22 DIAGNOSIS — L03116 Cellulitis of left lower limb: Secondary | ICD-10-CM | POA: Diagnosis not present

## 2023-02-22 DIAGNOSIS — L03115 Cellulitis of right lower limb: Secondary | ICD-10-CM | POA: Diagnosis not present

## 2023-02-22 DIAGNOSIS — I5032 Chronic diastolic (congestive) heart failure: Secondary | ICD-10-CM | POA: Diagnosis not present

## 2023-02-22 NOTE — Telephone Encounter (Signed)
 Called and spoke to [facility] patient's daughter in social worker. Verified name and DOB. She is calling to see if the y can schedule an appt with Dr Mona on Friday 2/7. Advised her Dr Mona is out of the office until 2/11. She stated patient is in a skilled nursing facility and has been in and out of the hospital since May of last year with increase fluid. She states patient was discharged from the hospital 2 weeks ago. She has gained 10 lbs in one week. She would like a call back from Dr Mona since there are no appts available with him or APP until 2/11. She stated her and patient son will be flying in Thursday. She was advised to take patient back to the ED but she stated that the fluid comes right back after patient is discharge from the hospital. Clancy or the son is not on the [facility]. Her contact information is Devore,Naomi [phone]

## 2023-02-22 NOTE — Telephone Encounter (Signed)
 Daughter in law and son will be in town Friday and they are wanting her to see Dr Maximo Spar while they are here. Please call Leon Rajas at [phone] Please advise

## 2023-02-23 ENCOUNTER — Inpatient Hospital Stay (HOSPITAL_COMMUNITY)
Admission: EM | Admit: 2023-02-23 | Discharge: 2023-03-02 | DRG: 871 | Disposition: A | Discharge status: Hospice | Payer: Medicare PPO | Source: Skilled Nursing Facility | Attending: Family Medicine | Admitting: Family Medicine

## 2023-02-23 ENCOUNTER — Emergency Department (HOSPITAL_COMMUNITY): Payer: Medicare PPO

## 2023-02-23 ENCOUNTER — Encounter (HOSPITAL_COMMUNITY): Payer: Self-pay | Admitting: Emergency Medicine

## 2023-02-23 DIAGNOSIS — F32A Depression, unspecified: Secondary | ICD-10-CM | POA: Diagnosis present

## 2023-02-23 DIAGNOSIS — I77810 Thoracic aortic ectasia: Secondary | ICD-10-CM | POA: Diagnosis present

## 2023-02-23 DIAGNOSIS — I5033 Acute on chronic diastolic (congestive) heart failure: Secondary | ICD-10-CM | POA: Diagnosis present

## 2023-02-23 DIAGNOSIS — E1122 Type 2 diabetes mellitus with diabetic chronic kidney disease: Secondary | ICD-10-CM | POA: Diagnosis present

## 2023-02-23 DIAGNOSIS — Z881 Allergy status to other antibiotic agents status: Secondary | ICD-10-CM

## 2023-02-23 DIAGNOSIS — I5084 End stage heart failure: Secondary | ICD-10-CM | POA: Diagnosis present

## 2023-02-23 DIAGNOSIS — I480 Paroxysmal atrial fibrillation: Secondary | ICD-10-CM | POA: Diagnosis present

## 2023-02-23 DIAGNOSIS — Z86006 Personal history of melanoma in-situ: Secondary | ICD-10-CM

## 2023-02-23 DIAGNOSIS — I130 Hypertensive heart and chronic kidney disease with heart failure and stage 1 through stage 4 chronic kidney disease, or unspecified chronic kidney disease: Secondary | ICD-10-CM | POA: Diagnosis present

## 2023-02-23 DIAGNOSIS — I071 Rheumatic tricuspid insufficiency: Secondary | ICD-10-CM | POA: Diagnosis present

## 2023-02-23 DIAGNOSIS — E860 Dehydration: Secondary | ICD-10-CM | POA: Diagnosis present

## 2023-02-23 DIAGNOSIS — I509 Heart failure, unspecified: Secondary | ICD-10-CM | POA: Diagnosis not present

## 2023-02-23 DIAGNOSIS — Z7901 Long term (current) use of anticoagulants: Secondary | ICD-10-CM

## 2023-02-23 DIAGNOSIS — E785 Hyperlipidemia, unspecified: Secondary | ICD-10-CM | POA: Diagnosis present

## 2023-02-23 DIAGNOSIS — Z7401 Bed confinement status: Secondary | ICD-10-CM | POA: Diagnosis not present

## 2023-02-23 DIAGNOSIS — N170 Acute kidney failure with tubular necrosis: Secondary | ICD-10-CM | POA: Diagnosis present

## 2023-02-23 DIAGNOSIS — N179 Acute kidney failure, unspecified: Secondary | ICD-10-CM | POA: Diagnosis not present

## 2023-02-23 DIAGNOSIS — Z1152 Encounter for screening for COVID-19: Secondary | ICD-10-CM

## 2023-02-23 DIAGNOSIS — A419 Sepsis, unspecified organism: Principal | ICD-10-CM | POA: Diagnosis present

## 2023-02-23 DIAGNOSIS — J90 Pleural effusion, not elsewhere classified: Secondary | ICD-10-CM | POA: Diagnosis not present

## 2023-02-23 DIAGNOSIS — Z8249 Family history of ischemic heart disease and other diseases of the circulatory system: Secondary | ICD-10-CM

## 2023-02-23 DIAGNOSIS — E861 Hypovolemia: Secondary | ICD-10-CM | POA: Diagnosis present

## 2023-02-23 DIAGNOSIS — M7989 Other specified soft tissue disorders: Secondary | ICD-10-CM | POA: Diagnosis not present

## 2023-02-23 DIAGNOSIS — I2721 Secondary pulmonary arterial hypertension: Secondary | ICD-10-CM | POA: Diagnosis not present

## 2023-02-23 DIAGNOSIS — I5032 Chronic diastolic (congestive) heart failure: Secondary | ICD-10-CM | POA: Diagnosis not present

## 2023-02-23 DIAGNOSIS — Z87442 Personal history of urinary calculi: Secondary | ICD-10-CM

## 2023-02-23 DIAGNOSIS — F419 Anxiety disorder, unspecified: Secondary | ICD-10-CM | POA: Diagnosis present

## 2023-02-23 DIAGNOSIS — R6520 Severe sepsis without septic shock: Secondary | ICD-10-CM | POA: Diagnosis present

## 2023-02-23 DIAGNOSIS — Z66 Do not resuscitate: Secondary | ICD-10-CM | POA: Diagnosis present

## 2023-02-23 DIAGNOSIS — I959 Hypotension, unspecified: Secondary | ICD-10-CM | POA: Diagnosis not present

## 2023-02-23 DIAGNOSIS — Z9842 Cataract extraction status, left eye: Secondary | ICD-10-CM

## 2023-02-23 DIAGNOSIS — Z515 Encounter for palliative care: Secondary | ICD-10-CM | POA: Diagnosis not present

## 2023-02-23 DIAGNOSIS — L03115 Cellulitis of right lower limb: Secondary | ICD-10-CM | POA: Diagnosis not present

## 2023-02-23 DIAGNOSIS — L03116 Cellulitis of left lower limb: Secondary | ICD-10-CM | POA: Diagnosis not present

## 2023-02-23 DIAGNOSIS — I519 Heart disease, unspecified: Secondary | ICD-10-CM

## 2023-02-23 DIAGNOSIS — R251 Tremor, unspecified: Secondary | ICD-10-CM | POA: Diagnosis present

## 2023-02-23 DIAGNOSIS — I358 Other nonrheumatic aortic valve disorders: Secondary | ICD-10-CM | POA: Diagnosis present

## 2023-02-23 DIAGNOSIS — M51369 Other intervertebral disc degeneration, lumbar region without mention of lumbar back pain or lower extremity pain: Secondary | ICD-10-CM | POA: Diagnosis present

## 2023-02-23 DIAGNOSIS — Z880 Allergy status to penicillin: Secondary | ICD-10-CM

## 2023-02-23 DIAGNOSIS — N1832 Chronic kidney disease, stage 3b: Secondary | ICD-10-CM | POA: Diagnosis present

## 2023-02-23 DIAGNOSIS — Z9049 Acquired absence of other specified parts of digestive tract: Secondary | ICD-10-CM

## 2023-02-23 DIAGNOSIS — Z79899 Other long term (current) drug therapy: Secondary | ICD-10-CM

## 2023-02-23 DIAGNOSIS — J9601 Acute respiratory failure with hypoxia: Secondary | ICD-10-CM | POA: Diagnosis not present

## 2023-02-23 DIAGNOSIS — D631 Anemia in chronic kidney disease: Secondary | ICD-10-CM | POA: Diagnosis present

## 2023-02-23 DIAGNOSIS — Z9841 Cataract extraction status, right eye: Secondary | ICD-10-CM

## 2023-02-23 DIAGNOSIS — R339 Retention of urine, unspecified: Secondary | ICD-10-CM | POA: Diagnosis not present

## 2023-02-23 DIAGNOSIS — Z961 Presence of intraocular lens: Secondary | ICD-10-CM | POA: Diagnosis present

## 2023-02-23 DIAGNOSIS — R531 Weakness: Secondary | ICD-10-CM | POA: Diagnosis not present

## 2023-02-23 DIAGNOSIS — Z7984 Long term (current) use of oral hypoglycemic drugs: Secondary | ICD-10-CM

## 2023-02-23 DIAGNOSIS — F05 Delirium due to known physiological condition: Secondary | ICD-10-CM | POA: Diagnosis present

## 2023-02-23 DIAGNOSIS — I371 Nonrheumatic pulmonary valve insufficiency: Secondary | ICD-10-CM | POA: Diagnosis not present

## 2023-02-23 DIAGNOSIS — Z860101 Personal history of adenomatous and serrated colon polyps: Secondary | ICD-10-CM

## 2023-02-23 DIAGNOSIS — M8580 Other specified disorders of bone density and structure, unspecified site: Secondary | ICD-10-CM | POA: Diagnosis present

## 2023-02-23 DIAGNOSIS — K5900 Constipation, unspecified: Secondary | ICD-10-CM | POA: Diagnosis not present

## 2023-02-23 DIAGNOSIS — R0989 Other specified symptoms and signs involving the circulatory and respiratory systems: Secondary | ICD-10-CM | POA: Diagnosis not present

## 2023-02-23 LAB — BLOOD GAS, VENOUS
Acid-Base Excess: 2.5 mmol/L — ABNORMAL HIGH (ref 0.0–2.0)
Bicarbonate: 28.4 mmol/L — ABNORMAL HIGH (ref 20.0–28.0)
O2 Saturation: 87.5 %
Patient temperature: 37
pCO2, Ven: 48 mm[Hg] (ref 44–60)
pH, Ven: 7.38 (ref 7.25–7.43)
pO2, Ven: 55 mm[Hg] — ABNORMAL HIGH (ref 32–45)

## 2023-02-23 LAB — RESP PANEL BY RT-PCR (RSV, FLU A&B, COVID)  RVPGX2
Influenza A by PCR: NEGATIVE
Influenza B by PCR: NEGATIVE
Resp Syncytial Virus by PCR: NEGATIVE
SARS Coronavirus 2 by RT PCR: NEGATIVE

## 2023-02-23 LAB — TYPE AND SCREEN
ABO/RH(D): A POS
Antibody Screen: NEGATIVE

## 2023-02-23 LAB — CBC WITH DIFFERENTIAL/PLATELET
Abs Immature Granulocytes: 0.11 10*3/uL — ABNORMAL HIGH (ref 0.00–0.07)
Basophils Absolute: 0 10*3/uL (ref 0.0–0.1)
Basophils Relative: 0 %
Eosinophils Absolute: 0.2 10*3/uL (ref 0.0–0.5)
Eosinophils Relative: 1 %
HCT: 24.8 % — ABNORMAL LOW (ref 36.0–46.0)
Hemoglobin: 7.3 g/dL — ABNORMAL LOW (ref 12.0–15.0)
Immature Granulocytes: 1 %
Lymphocytes Relative: 7 %
Lymphs Abs: 1 10*3/uL (ref 0.7–4.0)
MCH: 27.5 pg (ref 26.0–34.0)
MCHC: 29.4 g/dL — ABNORMAL LOW (ref 30.0–36.0)
MCV: 93.6 fL (ref 80.0–100.0)
Monocytes Absolute: 0.9 10*3/uL (ref 0.1–1.0)
Monocytes Relative: 7 %
Neutro Abs: 11.3 10*3/uL — ABNORMAL HIGH (ref 1.7–7.7)
Neutrophils Relative %: 84 %
Platelets: 269 10*3/uL (ref 150–400)
RBC: 2.65 MIL/uL — ABNORMAL LOW (ref 3.87–5.11)
RDW: 16.1 % — ABNORMAL HIGH (ref 11.5–15.5)
WBC: 13.5 10*3/uL — ABNORMAL HIGH (ref 4.0–10.5)
nRBC: 0 % (ref 0.0–0.2)

## 2023-02-23 LAB — COMPREHENSIVE METABOLIC PANEL
ALT: 35 U/L (ref 0–44)
AST: 49 U/L — ABNORMAL HIGH (ref 15–41)
Albumin: 1.9 g/dL — ABNORMAL LOW (ref 3.5–5.0)
Alkaline Phosphatase: 76 U/L (ref 38–126)
Anion gap: 10 (ref 5–15)
BUN: 56 mg/dL — ABNORMAL HIGH (ref 8–23)
CO2: 26 mmol/L (ref 22–32)
Calcium: 8.3 mg/dL — ABNORMAL LOW (ref 8.9–10.3)
Chloride: 104 mmol/L (ref 98–111)
Creatinine, Ser: 2.69 mg/dL — ABNORMAL HIGH (ref 0.44–1.00)
GFR, Estimated: 17 mL/min — ABNORMAL LOW (ref >60)
Glucose, Bld: 94 mg/dL (ref 70–99)
Potassium: 4.3 mmol/L (ref 3.5–5.1)
Sodium: 140 mmol/L (ref 135–145)
Total Bilirubin: 0.5 mg/dL (ref 0.0–1.2)
Total Protein: 5.2 g/dL — ABNORMAL LOW (ref 6.5–8.1)

## 2023-02-23 LAB — I-STAT CG4 LACTIC ACID, ED: Lactic Acid, Venous: 0.7 mmol/L (ref 0.5–1.9)

## 2023-02-23 LAB — BRAIN NATRIURETIC PEPTIDE: B Natriuretic Peptide: 650.2 pg/mL — ABNORMAL HIGH (ref 0.0–100.0)

## 2023-02-23 LAB — TROPONIN I (HIGH SENSITIVITY): Troponin I (High Sensitivity): 11 ng/L (ref <18)

## 2023-02-23 MED ORDER — FLUTICASONE PROPIONATE 50 MCG/ACT NA SUSP
2.0000 | Freq: Every day | NASAL | Status: DC
  Administered 2023-02-24 – 2023-02-28 (×5): 2 via NASAL
  Filled 2023-02-23: qty 16

## 2023-02-23 MED ORDER — ONDANSETRON HCL 4 MG PO TABS
4.0000 mg | ORAL_TABLET | Freq: Four times a day (QID) | ORAL | Status: DC | PRN
  Administered 2023-02-25: 4 mg via ORAL
  Filled 2023-02-23: qty 1

## 2023-02-23 MED ORDER — ATORVASTATIN CALCIUM 40 MG PO TABS
40.0000 mg | ORAL_TABLET | Freq: Every day | ORAL | Status: DC
  Administered 2023-02-24 – 2023-03-01 (×6): 40 mg via ORAL
  Filled 2023-02-23 (×7): qty 1

## 2023-02-23 MED ORDER — CALCIUM CARBONATE 1250 (500 CA) MG PO TABS
1250.0000 mg | ORAL_TABLET | Freq: Every day | ORAL | Status: DC
  Administered 2023-02-24 – 2023-03-01 (×6): 1250 mg via ORAL
  Filled 2023-02-23 (×6): qty 1

## 2023-02-23 MED ORDER — ALPRAZOLAM 0.25 MG PO TABS
0.5000 mg | ORAL_TABLET | Freq: Three times a day (TID) | ORAL | Status: DC | PRN
  Administered 2023-02-25 – 2023-02-28 (×5): 0.5 mg via ORAL
  Filled 2023-02-23 (×6): qty 2

## 2023-02-23 MED ORDER — SODIUM CHLORIDE 0.9 % IV SOLN
2.0000 g | Freq: Once | INTRAVENOUS | Status: AC
  Administered 2023-02-23: 2 g via INTRAVENOUS
  Filled 2023-02-23: qty 20

## 2023-02-23 MED ORDER — AMIODARONE HCL 200 MG PO TABS: 200.0000 mg | ORAL_TABLET | Freq: Every day | ORAL | Status: DC

## 2023-02-23 MED ORDER — SODIUM CHLORIDE 0.9 % IV BOLUS
1000.0000 mL | Freq: Once | INTRAVENOUS | Status: AC
  Administered 2023-02-23: 1000 mL via INTRAVENOUS

## 2023-02-23 MED ORDER — APOAEQUORIN 10 MG PO CAPS: ORAL_CAPSULE | Freq: Every day | ORAL | Status: DC

## 2023-02-23 MED ORDER — SODIUM CHLORIDE 0.9 % IV BOLUS
1000.0000 mL | Freq: Once | INTRAVENOUS | Status: AC
  Administered 2023-02-23 (×2): 1000 mL via INTRAVENOUS

## 2023-02-23 MED ORDER — ALBUTEROL SULFATE (2.5 MG/3ML) 0.083% IN NEBU: 3.0000 mL | INHALATION_SOLUTION | RESPIRATORY_TRACT | Status: DC | PRN

## 2023-02-23 MED ORDER — ACETAMINOPHEN 325 MG PO TABS
650.0000 mg | ORAL_TABLET | Freq: Four times a day (QID) | ORAL | Status: DC | PRN
  Administered 2023-02-25 – 2023-02-26 (×6): 650 mg via ORAL
  Filled 2023-02-23 (×7): qty 2

## 2023-02-23 MED ORDER — AMIODARONE HCL 200 MG PO TABS
200.0000 mg | ORAL_TABLET | Freq: Two times a day (BID) | ORAL | Status: DC
  Administered 2023-02-24 – 2023-03-02 (×13): 200 mg via ORAL
  Filled 2023-02-23 (×15): qty 1

## 2023-02-23 MED ORDER — CHLORHEXIDINE GLUCONATE CLOTH 2 % EX PADS
6.0000 | MEDICATED_PAD | Freq: Every day | CUTANEOUS | Status: DC
  Administered 2023-02-25 – 2023-02-28 (×4): 6 via TOPICAL

## 2023-02-23 MED ORDER — TORSEMIDE 20 MG PO TABS: 20.0000 mg | ORAL_TABLET | Freq: Every day | ORAL | Status: DC

## 2023-02-23 MED ORDER — GABAPENTIN 100 MG PO CAPS
100.0000 mg | ORAL_CAPSULE | Freq: Two times a day (BID) | ORAL | Status: DC
  Administered 2023-02-24 – 2023-03-02 (×14): 100 mg via ORAL
  Filled 2023-02-23 (×14): qty 1

## 2023-02-23 MED ORDER — SODIUM CHLORIDE 0.9% IV SOLUTION: Freq: Once | INTRAVENOUS | Status: DC

## 2023-02-23 MED ORDER — SENNOSIDES-DOCUSATE SODIUM 8.6-50 MG PO TABS
1.0000 | ORAL_TABLET | Freq: Every evening | ORAL | Status: DC | PRN
  Administered 2023-02-26: 1 via ORAL
  Filled 2023-02-23: qty 1

## 2023-02-23 MED ORDER — ONDANSETRON HCL 4 MG/2ML IJ SOLN: 4.0000 mg | Freq: Four times a day (QID) | INTRAMUSCULAR | Status: DC | PRN

## 2023-02-23 MED ORDER — APIXABAN 2.5 MG PO TABS
2.5000 mg | ORAL_TABLET | Freq: Two times a day (BID) | ORAL | Status: DC
  Administered 2023-02-24 – 2023-03-02 (×13): 2.5 mg via ORAL
  Filled 2023-02-23 (×15): qty 1

## 2023-02-23 MED ORDER — ACETAMINOPHEN 650 MG RE SUPP: 650.0000 mg | Freq: Four times a day (QID) | RECTAL | Status: DC | PRN

## 2023-02-23 MED ORDER — BIOTIN 5000 MCG PO TABS: 5000.0000 ug | ORAL_TABLET | Freq: Every day | ORAL | Status: DC

## 2023-02-23 NOTE — ED Triage Notes (Signed)
 Pt here from [facility] with c/o fever and general body aches

## 2023-02-23 NOTE — ED Provider Triage Note (Addendum)
 Emergency Medicine Provider Triage Evaluation Note  JANNICE BEITZEL , a 88 y.o. female  was evaluated in triage.  Pt complains of 3 days of bilateral foot pain, body aches, fever, bilateral leg swelling.  Was recently discharged from the hospital on 02/15/2023 with acute diastolic heart failure and atrial fibrillation and atrial flutter, right extremity cellulitis.  Last ambulated yesterday however has been unable to do so due to foot pain.  Currently on apixaban  2.5 mg twice daily.  Denies headache, cough, congestion, vision changes, chest pain, shortness of breath, abdominal pain, diarrhea, dysuria.    Review of Systems  Positive: N/a Negative: N/a  Physical Exam  BP (!) 99/49 (BP Location: Left Arm)   Pulse 68   Temp 98.2 F (36.8 C) (Oral)   LMP  (LMP Unknown)   SpO2 (!) 89%  Gen:   Awake, no distress   Resp:  Normal effort  MSK:   Moves extremities without difficulty  Other:  A/Ox4  Medical Decision Making  Medically screening exam initiated at 1:30 PM.  Appropriate orders placed.  GWENLYN HOTTINGER was informed that the remainder of the evaluation will be completed by another provider, this initial triage assessment does not replace that evaluation, and the importance of remaining in the ED until their evaluation is complete.     Beola Terrall RAMAN, PA-C 02/23/23 1341    Beola Terrall RAMAN, [place] 02/23/23 1345

## 2023-02-23 NOTE — ED Provider Notes (Signed)
 4:37 PM Assumed from Dr. Francesca.  At time of transfer care, patient is awaiting results of lab work prior to needing admission for suspected cellulitis causing hypotension and fatigue.  She reportedly did not have any known sources of bleeding and previous team was really concerned about the leg redness and suspected cellulitis.  Anticipate admission after labs are completed.  5:40 PM Workup continues to return and I reassessed patient.  I am concerned that patient may need a higher level of care at this time.  When I assessed her, blood pressure was back into the 80s, oxygen saturations were in the 80s needing oxygen supplementation now, she appears to have an AKI, and her BNP is more elevated and prior.  She has leukocytosis and hemoglobin has now dropped to 7.3.  She denies any sources of bleeding and her recent admit revealed she was Hemoccult negative however I am concerned about giving her more more fluid for this low blood pressure.  Patient was given antibiotics for suspected cellulitis in the legs however I suspect there may be degree of symptomatic anemia as well.  Due to her hypotension, will order her a unit of blood and will call ICU as she may end up needing pressors rather than more fluids at this time.  Initially placed a call to hospitalist to admit but would like critical care to be involved to make sure she does not need ICU as compared to stepdown.  5:54 PM Spoke to critical care who reviewed the case and they do not feel she needs blood now.  They agreed with the antibiotics and giving her more fluids and admit to stepdown under hospitalist.  They will be available for assistance if needed.  They did not think she needs pressors at this time either.  I cancelled the blood I ordered and orders more fluids.  She will be admitted.   Clinical Impression: 1. Bilateral lower leg cellulitis   2. Hypotension due to hypovolemia     Disposition: Admit  This note was prepared  with assistance of Dragon voice recognition software. Occasional wrong-word or sound-a-like substitutions may have occurred due to the inherent limitations of voice recognition software.       Melrose Kearse, Lonni PARAS, MD 02/23/23 [phone]

## 2023-02-23 NOTE — Progress Notes (Signed)
 PHARMACIST - PHYSICIAN ORDER COMMUNICATION  CONCERNING: P&T Medication Policy on Herbal Medications  DESCRIPTION:  This patient's order for:  apoaequorin and biotin   has been noted.  This product(s) is classified as an "herbal" or natural product. Due to a lack of definitive safety studies or FDA approval, nonstandard manufacturing practices, plus the potential risk of unknown drug-drug interactions while on inpatient medications, the Pharmacy and Therapeutics Committee does not permit the use of "herbal" or natural products of this type within [facility].   ACTION TAKEN: The pharmacy department is unable to verify this order at this time and your patient has been informed of this safety policy. Please reevaluate patient's clinical condition at discharge and address if the herbal or natural product(s) should be resumed at that time.   Leeroy Mace RPh 02/23/2023, 10:29 PM

## 2023-02-23 NOTE — ED Notes (Signed)
 Pt requesting food. Pt educated by this Clinical research associate and ED Provider that we are not able to give food while we are still in the middle of her workup for safety reasons.

## 2023-02-23 NOTE — H&P (Signed)
 History and Physical  Brenda Matthews FMW:[id number] DOB: July 08, 1935 DOA: 02/23/2023  PCP: Feliciano Devoria LABOR, MD   Chief Complaint: Fever, leg swelling and redness  HPI: Brenda Matthews is a 88 y.o. female with medical history significant for chronic anemia, chronic leg edema, CKD 3B, non-insulin -dependent diabetes, hypertension, hyperlipidemia, atrial fibrillation on Eliquis , anxiety, DDD, and chronic diastolic heart failure who presented from her SNF admission of fever, leg swelling and redness. Patient had a 11-day hospitalization from 1/18 - 1/29 for CHF exacerbation and A-fib with RVR. Patient had a cardioversion on 1/28 was placed on amiodarone  therapy. She returns today after she was found to have generalized weakness and fever at her facility.  She also endorsed worsening leg swelling with associated leg swelling and some redness.  She denies any chest pain, shortness of breath, dysuria, dizziness, headache, vision changes, palpitations, melena, hematochezia or hematuria. Reports that she does not think she has been drinking enough water.  ED Course: Initial vitals showed temp 98.2, RR 18, HR 68, BP 99/49, SpO2 89% on room air, improved to 98% with 2 L.  Labs significant for WBC 13.5, Hgb 7.3, platelet 269, BUN/creatinine 56/2.69 (from 35/1.54 on recent discharge), BNP 650, troponin 11, negative COVID, flu and RSV test.  Chest x-ray showed bilateral small pleural effusions, L>R.  Patient received IV NS 1 L bolus in the ED.  She continued to have persistent hypotension with SBP in the 80s to 90s.  TRH initially consulted for admission and request that PCCM to evaluate patient.  PCCM reviewed patient's chart and did not feel patient meets criteria for ICU and pressors.  They recommended additional IV fluid bolus and continuing antibiotics.  TRH was reconsulted for admission.  Review of Systems: Please see HPI for pertinent positives and negatives. A complete 10 system review of systems are otherwise  negative.  Past Medical History:  Diagnosis Date   Abnormal EKG approx 2008   Nuclear stress test neg;    Acute pancreatitis 09/2020   idiopathic.  +panc psudocyst   Anxiety    with panic   CAP (community acquired pneumonia) 08/2017   Hospitalization for CAP/acute diast HF/rapid a-fib   Cataract    s/p surgery--lens implants   Chronic diastolic heart failure (HCC) 2019   Chronic renal insufficiency, stage 3 (moderate) (HCC) 12/04/2012   Renal u/s when in hosp 08/2017 for CAP/CHF showed symmetric kidneys, echogenicity normal, w/out hydronephrosis. Baseline GFR around 40 ml/min as of 10/2018.   DDD (degenerative disc disease), lumbar    Diverticulosis 2009   Fracture of radial shaft, left, closed 11/16/2010   fell down flight of stairs   History of kidney stones    Hx of adenomatous colonic polyps 2002;2009;2015   surveillance colonoscopy 2009, +polypectomy done-tubular adenoma w/out high grade.  05/2013 tubular adenomas--recall 3 yrs   Hyperlipidemia    Hypertension    Low TSH level 02/18/2016   T3 norm, T4 mildly elevated--suspected sick euthyroid syndrome.  Repeat labs 06/2016: normal.   Melanoma in situ (HCC) 06/2018   L LL   Osteoarthritis of both knees    viscosupplementation injections helpful 2020/21   Osteopenia    DEXA 08/2010; repeat DEXA 02/2015 worse: fosamax  started.  06/2018 Dexa T score -2.4.  2020 maj osteop fx risk = 24%, Hip fx risk 7.3%. 09/2020 T score -2.1   PAF (paroxysmal atrial fibrillation) (HCC) 02/2016   2018.   DCCV x 2 2024.   Peripheral edema    Pneumonia 2015  hx with sepsis   Rheumatic fever    Past Surgical History:  Procedure Laterality Date   APPENDECTOMY  1966   done during surgery for tubal pregnancy   CARDIOVERSION N/A 06/17/2022   Procedure: CARDIOVERSION;  Surgeon: Lonni Slain, MD;  Location: [facility] INVASIVE CV LAB;  Service: Cardiovascular;  Laterality: N/A;   CARDIOVERSION N/A 07/11/2022   Procedure: CARDIOVERSION;  Surgeon:  Cherrie Toribio SAUNDERS, MD;  Location: [facility] INVASIVE CV LAB;  Service: Cardiovascular;  Laterality: N/A;   CARDIOVERSION N/A 02/14/2023   Procedure: CARDIOVERSION;  Surgeon: Raford Riggs, MD;  Location: [facility] INVASIVE CV LAB;  Service: Cardiovascular;  Laterality: N/A;   CATARACT EXTRACTION W/ INTRAOCULAR LENS IMPLANT  2013   bilat   COLONOSCOPY W/ POLYPECTOMY  05/2013   +diverticulosis; recall 3 yrs (Dr. Abran)   DEXA  02/2015; 06/2018   T score -2.1 in both femoral necks; FRAX 10 yr risk of major osteoporotic fracture was 21%---fosamax  started. 06/2018 T score -2.4.  T score 09/2020 -2.1. Rpt 2 yrs.   ECTOPIC PREGNANCY SURGERY     EYE SURGERY     LEFT HEART CATH AND CORONARY ANGIOGRAPHY N/A 09/01/2017   No angiographically significant CAD.  Upper normal left ventricular filling pressure.  Procedure: LEFT HEART CATH AND CORONARY ANGIOGRAPHY;  Surgeon: Mady Lonni, MD;  Location: [facility] INVASIVE CV LAB;  Service: Cardiovascular;  Laterality: N/A;   LUMBAR LAMINECTOMY/DECOMPRESSION MICRODISCECTOMY N/A 09/26/2018   Procedure: Decompressive Lumbar Laminectomy L5 S1 FORAMINOTOMY L5 S1  NERVE ROOT BILATERALLY and Microdiscectomy L5-S1 Left;  Surgeon: Heide Ingle, MD;  Location: [facility] ORS;  Service: Orthopedics;  Laterality: N/A;    OPEN REDUCTION INTERNAL FIXATION (ORIF) TIBIA/FIBULA FRACTURE Left 02/18/2016   Procedure: OPEN REDUCTION INTERNAL FIXATION (ORIF) Right ankle trimalleolar fracture;  Surgeon: Norleen Armor, MD;  Location: [facility] OR;  Service: Orthopedics;  Laterality: Left;  requests   ORIF RADIAL FRACTURE  11/18/2010   left; s/p slip on slippery floor and fell   THORACENTESIS  08/2017   diagnostic and therapeutic.  Transudative.  Clx neg.  (+pulm edema/diastolic HF)   TONSILLECTOMY     TRANSTHORACIC ECHOCARDIOGRAM  02/18/2016; 08/10/17   LVEF of 55-60%, mild AI and mild MR and normal biatrial size.  07/2017--normal LV function, mild enlarge aortic root, mild/mod TR, bilat atrial  enlargement. 06/09/22 EF 50-55%, mod MR, aortic root stable enlgmt.  12/2022 normal LV syst fxn, grd I DD, stable mild MR and aortic root 40 mm   Social History:  reports that she has never smoked. She has never used smokeless tobacco. She reports current alcohol  use. She reports that she does not use drugs.  Allergies  Allergen Reactions   Augmentin  [Amoxicillin -Pot Clavulanate] Nausea And Vomiting and Other (See Comments)    projectile vomiting Has patient had a PCN reaction causing immediate rash, facial/tongue/throat swelling, SOB or lightheadedness with hypotension:No Has patient had a PCN reaction causing severe rash involving mucus membranes or skin necrosis:No Has patient had a PCN reaction that required hospitalization:No Has patient had a PCN reaction occurring within the last 10 years:Yes If all of the above answers are NO, then may proceed with Cephalosporin use.    Amoxicillin  Rash   Clindamycin/Lincomycin Rash    Family History  Problem Relation Age of Onset   Heart disease Mother    Heart disease Father    Hypertension Brother    Diabetes Sister    Colon cancer Neg Hx    Pancreatic cancer Neg Hx    Rectal  cancer Neg Hx    Stomach cancer Neg Hx      Prior to Admission medications   Medication Sig Start Date End Date Taking? Authorizing Provider  acetaminophen  (TYLENOL ) 500 MG tablet Take 2 tablets (1,000 mg total) by mouth every 8 (eight) hours as needed. Patient taking differently: Take 1,000 mg by mouth in the morning and at bedtime. 01/14/23  Yes Perri DELENA Meliton Mickey., MD  albuterol  (VENTOLIN  HFA) 108 (90 Base) MCG/ACT inhaler INHALE 2 PUFFS BY MOUTH into THE lungs EVERY 4 HOURS AS NEEDED FOR WHEEZING OR SHORTNESS OF BREATH Patient taking differently: Inhale 2 puffs into the lungs every 4 (four) hours as needed for shortness of breath. 10/12/22  Yes McGowen, Aleene DEL, MD  ALPRAZolam  (NIRAVAM ) 0.5 MG dissolvable tablet Take 0.5 mg by mouth every 8 (eight) hours  as needed for anxiety.   Yes [provider]  amiodarone  (PACERONE ) 200 MG tablet Take 1 tablet (200 mg total) by mouth 2 (two) times daily for 21 days, THEN 1 tablet (200 mg total) daily. Follow with cardiology for additional recommendations. 02/14/23 04/06/23 Yes Perri DELENA Meliton Mickey., MD  apixaban  (ELIQUIS ) 2.5 MG TABS tablet Take 2.5 mg by mouth 2 (two) times daily.   Yes [provider]  Apoaequorin (PREVAGEN PO) Take 1 tablet by mouth daily.   Yes [provider]  atorvastatin  (LIPITOR) 40 MG tablet TAKE 1 TABLET BY MOUTH EVERY DAY 11/02/22  Yes McGowen, Aleene DEL, MD  Biotin  5000 MCG TABS Take 5,000 mcg by mouth daily.   Yes [provider]  Calcium  Carbonate (CALCIUM  600 PO) Take 1,200 mg by mouth daily.    Yes [provider]  ciprofloxacin  (CIPRO ) 500 MG tablet Take 500 mg by mouth 2 (two) times daily.   Yes [provider]  Coenzyme Q10 200 MG capsule Take 200 mg by mouth daily.   Yes [provider]  fluticasone  (FLONASE ) 50 MCG/ACT nasal spray Place 2 sprays into both nostrils daily.    Yes [provider]  gabapentin  (NEURONTIN ) 100 MG capsule Take 1 capsule (100 mg total) by mouth 3 (three) times daily. Patient taking differently: Take 100 mg by mouth 2 (two) times daily. 01/14/23  Yes Perri DELENA Meliton Mickey., MD  hydrALAZINE  (APRESOLINE ) 10 MG tablet Take 10 mg by mouth 3 (three) times daily.   Yes [provider]  JARDIANCE  10 MG TABS tablet TAKE 1 TABLET BY MOUTH EVERY DAY BEFORE BREAKFAST Patient taking differently: Take 10 mg by mouth daily. 11/16/22  Yes Vannie Reche RAMAN, NP  metoprolol  tartrate (LOPRESSOR ) 50 MG tablet Take 1 tablet (50 mg total) by mouth 2 (two) times daily. 02/14/23 03/16/23 Yes Perri DELENA Meliton Mickey., MD  torsemide  (DEMADEX ) 10 MG tablet Take 2 tablets (20 mg total) by mouth daily. 02/15/23  Yes Gherghe, Costin M, MD  carboxymethylcellulose (REFRESH PLUS) 0.5 % SOLN Place 2 drops  into both eyes daily as needed (dry/irritated eyes.). Patient not taking: Reported on 02/23/2023    [provider]  Emollient (EUCERIN ADVANCED REPAIR EX) Apply 1 Application topically at bedtime. Patient not taking: Reported on 02/23/2023    [provider]  ferrous sulfate  325 (65 FE) MG tablet Take 1 tablet (325 mg total) by mouth every other day. Patient not taking: Reported on 02/23/2023 02/16/23   Perri DELENA Meliton Mickey., MD  hydrocortisone  2.5 % cream APPLY DAILY AS NEEDED TO RASH ON FEET AND LEGS Patient not taking: Reported on 02/23/2023 12/27/22  Jerilynn Lamarr HERO, NP  ipratropium-albuterol  (DUONEB) 0.5-2.5 (3) MG/3ML SOLN Inhale 3 mLs into the lungs at bedtime. Patient not taking: Reported on 02/23/2023    [provider]  Melatonin 5 MG CAPS Take 5 mg by mouth at bedtime. Patient not taking: Reported on 02/23/2023    [provider]  Menthol , Topical Analgesic, 4 % GEL Apply 1 Application topically every 8 (eight) hours as needed (ankles/heels). Patient not taking: Reported on 02/23/2023    [provider]  Multiple Vitamins-Minerals (CENTRUM SILVER  ULTRA WOMENS) TABS Take 1 tablet by mouth every evening. Patient not taking: Reported on 02/23/2023    [provider]    Physical Exam: BP 114/65 (BP Location: Left Arm)   Pulse 64   Temp (!) 97.5 F (36.4 C) (Oral)   Resp 16   Wt 77.2 kg   LMP  (LMP Unknown)   SpO2 96%   BMI 32.16 kg/m  General: Pleasant, chronically ill elderly woman laying in bed. No acute distress. HEENT: [place]/AT. Anicteric sclera. Dry mucous membrane. CV: RRR. No murmurs, rubs, or gallops. 2+ BLE pitting edema up to knee Pulmonary: On 2 L [place]. Lungs CTAB. Normal effort. No wheezing or rales. Abdominal: Soft, nontender, nondistended. Normal bowel sounds. Extremities: Palpable radial and DP pulses. Normal ROM. Skin: Warm and dry. BLE with erythema, warmth, tenderness and edema. Neuro: A&Ox3. Moves all extremities.  Normal sensation to light touch. No focal deficit. Psych: Normal mood and affect          Labs on Admission:  Basic Metabolic Panel: Recent Labs  Lab 02/23/23 1539  NA 140  K 4.3  CL 104  CO2 26  GLUCOSE 94  BUN 56*  CREATININE 2.69*  CALCIUM  8.3*   Liver Function Tests: Recent Labs  Lab 02/23/23 1539  AST 49*  ALT 35  ALKPHOS 76  BILITOT 0.5  PROT 5.2*  ALBUMIN 1.9*   No results for input(s): LIPASE, AMYLASE in the last 168 hours. No results for input(s): AMMONIA in the last 168 hours. CBC: Recent Labs  Lab 02/23/23 1539  WBC 13.5*  NEUTROABS 11.3*  HGB 7.3*  HCT 24.8*  MCV 93.6  PLT 269   Cardiac Enzymes: No results for input(s): CKTOTAL, CKMB, CKMBINDEX, TROPONINI in the last 168 hours. BNP (last 3 results) Recent Labs    01/02/23 0855 02/04/23 0555 02/23/23 1539  BNP 1,006.1* 546.8* 650.2*    ProBNP (last 3 results) No results for input(s): PROBNP in the last 8760 hours.  CBG: No results for input(s): GLUCAP in the last 168 hours.  Radiological Exams on Admission: DG Chest 2 View Result Date: 02/23/2023 CLINICAL DATA:  Worsening bilateral LE swelling, low O2 sats, Hx of heart failure. EXAM: CHEST - 2 VIEW COMPARISON:  02/13/2023. FINDINGS: Low lung volume. There is blunting of bilateral posterior costophrenic angles, left more than right. There are probable associated compressive atelectatic changes. Bilateral lung fields are otherwise clear. No pneumothorax. No pulmonary edema. Stable cardio-mediastinal silhouette. No acute osseous abnormalities. The soft tissues are within normal limits. IMPRESSION: Bilateral small pleural effusions, left more than right. Electronically Signed   By: Ree Molt M.D.   On: 02/23/2023 15:40   Assessment/Plan Brenda Matthews is a 88 y.o. female with medical history significant for chronic anemia, chronic leg edema, CKD 3B, non-insulin -dependent diabetes, hypertension, hyperlipidemia, atrial  fibrillation on Eliquis , anxiety, DDD, and chronic diastolic heart failure who presented from her SNF admission of fever, leg swelling and redness and admitted for  severe sepsis secondary to cellulitis.  # Severe sepsis # Hypotension # BLE cellulitis Presented with worsening lower extremity edema, erythema and tenderness with associated generalized weakness and fever. Mild leukocytosis but afebrile on admission with normal lactic acid. Met severe sepsis criteria with leukocytosis and hypotension in the setting of cellulitis. Status post 2 L of IV fluids with improvement in SBP from the 80s-90s to 100-110s. She remains at baseline mental status. -Admit to stepdown bed for close monitoring -Continue IV Rocephin  -Intermittent fluid boluses to maintain MAP >65 -Follow-up blood culture -Trend CBC, fever curve  # HFpEF # BLE edema Last TTE in December 2024 showed EF 60-65%, mild LVH, G1DD and moderately dilated LA and RA. BNP elevated to 650. Patient has significant lower extremity edema however she is intravascularly dry with soft BP.  Chest x-ray shows stable small pleural effusions but no vascular congestion. She denies any shortness of breath. -Apply TED hose -Feet elevation as much as possible -Hold torsemide  today in the setting of AKI -Strict I&O's, daily weights  # AKI on CKD 3B Baseline creatinine around 1.4-1.5. Creatinine significantly elevated to 2.69 on admission likely secondary to dehydration and ATN from hypotension. Status post 2 L of IV fluid in the ED. -Avoid nephrotoxic's agents -Trend renal function  # A-fib Patient had cardioversion on 1/28 with conversion to normal sinus. Heart auscultation reveals regular rate and rhythm. -Continue amiodarone  and Eliquis  -Telemetry -Trend and replete electrolytes  # Chronic anemia # Anemia of chronic disease Workup during last hospitalization showed normal folate, normal iron/ferritin and elevated vitamin B12. Patient has no  evidence of active bleed. Hgb down to 7.3 from 7.7 at recent discharge. -Trend CBC -Transfuse to keep Hgb >7  # HTN Patient hypotensive on admission in the setting of cellulitis. -Hold BP meds for today  # Resting tremors -Continue to monitor  # HLD -Continue Lipitor  # Anxiety -Continue as needed Xanax   # Neuropathic pain -Continue gabapentin   # Generalized deconditioning In the setting of cellulitis and dehydration -PT/OT eval -Fall precautions  DVT prophylaxis: Eliquis     Code Status: Full Code  Consults called: None  Family Communication: No family at bedside  Severity of Illness: The appropriate patient status for this patient is INPATIENT. Inpatient status is judged to be reasonable and necessary in order to provide the required intensity of service to ensure the patient's safety. The patient's presenting symptoms, physical exam findings, and initial radiographic and laboratory data in the context of their chronic comorbidities is felt to place them at high risk for further clinical deterioration. Furthermore, it is not anticipated that the patient will be medically stable for discharge from the hospital within 2 midnights of admission.   * I certify that at the point of admission it is my clinical judgment that the patient will require inpatient hospital care spanning beyond 2 midnights from the point of admission due to high intensity of service, high risk for further deterioration and high frequency of surveillance required.*  Level of care: Wray Lou Claretta CHRISTELLA, MD 02/23/2023, 10:04 PM [facility] Pager: [phone] Isaiah 41:10   If 7PM-7AM, please contact night-coverage [email or web address] Password [email or web address]

## 2023-02-23 NOTE — ED Provider Notes (Signed)
 [place] EMERGENCY DEPARTMENT AT [facility] Provider Note  CSN: [id number] Arrival date & time: 02/23/23 1236  Chief Complaint(s) No chief complaint on file.  HPI Brenda Matthews is a 88 y.o. female presenting to the emergency department with weakness.  Patient recently admitted to the hospital for CHF.  Over the last few days has had increasing pain to both feet, trouble ambulating.  Also reported having a fever.  Reports compliance with Eliquis .  Denies cough, fevers or chills, congestion, abdominal pain, shortness of breath.   Past Medical History Past Medical History:  Diagnosis Date   Abnormal EKG approx 2008   Nuclear stress test neg;    Acute pancreatitis 09/2020   idiopathic.  +panc psudocyst   Anxiety    with panic   CAP (community acquired pneumonia) 08/2017   Hospitalization for CAP/acute diast HF/rapid a-fib   Cataract    s/p surgery--lens implants   Chronic diastolic heart failure (HCC) 2019   Chronic renal insufficiency, stage 3 (moderate) (HCC) 12/04/2012   Renal u/s when in hosp 08/2017 for CAP/CHF showed symmetric kidneys, echogenicity normal, w/out hydronephrosis. Baseline GFR around 40 ml/min as of 10/2018.   DDD (degenerative disc disease), lumbar    Diverticulosis 2009   Fracture of radial shaft, left, closed 11/16/2010   fell down flight of stairs   History of kidney stones    Hx of adenomatous colonic polyps 2002;2009;2015   surveillance colonoscopy 2009, +polypectomy done-tubular adenoma w/out high grade.  05/2013 tubular adenomas--recall 3 yrs   Hyperlipidemia    Hypertension    Low TSH level 02/18/2016   T3 norm, T4 mildly elevated--suspected sick euthyroid syndrome.  Repeat labs 06/2016: normal.   Melanoma in situ (HCC) 06/2018   L LL   Osteoarthritis of both knees    viscosupplementation injections helpful 2020/21   Osteopenia    DEXA 08/2010; repeat DEXA 02/2015 worse: fosamax  started.  06/2018 Dexa T score -2.4.  2020 maj osteop fx risk =  24%, Hip fx risk 7.3%. 09/2020 T score -2.1   PAF (paroxysmal atrial fibrillation) (HCC) 02/2016   2018.   DCCV x 2 2024.   Peripheral edema    Pneumonia 2015   hx with sepsis   Rheumatic fever    Patient Active Problem List   Diagnosis Date Noted   Acute diastolic (congestive) heart failure (HCC) 02/04/2023   Depression 01/11/2023   Cellulitis of right leg 01/09/2023   Hypokalemia 01/09/2023   Constipation 01/09/2023   Acute pulmonary edema (HCC) 01/03/2023   Acute respiratory failure with hypoxemia (HCC) 01/02/2023   High risk medication use 11/07/2022   Acute bilateral low back pain without sciatica 11/07/2022   ARF (acute renal failure) (HCC) 10/23/2022   Acute renal failure superimposed on stage 3b chronic kidney disease (HCC) 10/22/2022   Chronic heart failure with preserved ejection fraction (HFpEF) (HCC) 10/22/2022   Elevated transaminase level 10/22/2022   Physical deconditioning 06/14/2022   Acute on chronic diastolic (congestive) heart failure (HCC) 06/11/2022   Idiopathic acute pancreatitis without necrosis or infection 09/14/2020   Pancreatic cyst 09/14/2020   Pancreatic duct dilated 09/14/2020   Pure hypercholesterolemia 09/14/2020   Hypertensive heart and chronic kidney disease with heart failure and stage 1 through stage 4 chronic kidney disease, or unspecified chronic kidney disease (HCC) 09/28/2018   Spinal stenosis, lumbar region with neurogenic claudication 09/26/2018   Osteoarthritis of right knee 11/21/2017   Acute on chronic diastolic CHF (congestive heart failure) (HCC)    S/P  thoracentesis    Acute respiratory failure (HCC)    Rash in adult 08/30/2017   Atrial fibrillation and flutter (HCC)    DOE (dyspnea on exertion) 08/29/2017   Arterial hypotension    Chronic anticoagulation 12/19/2016   AKI (acute kidney injury) (HCC)    Low TSH level 02/19/2016   PAF (paroxysmal atrial fibrillation) (HCC) 02/18/2016   Ankle fracture, left-s/p surgery 02/18/16  02/18/2016   Closed displaced trimalleolar fracture of left ankle 02/18/2016   Acute bronchitis 02/10/2015   Bronchopneumonia 12/02/2013   Chronic kidney disease, stage 3b (HCC) 12/04/2012   Hx of adenomatous colonic polyps    Routine general medical examination at a health care facility 09/05/2011   Cervical cancer screening 02/24/2011   SCIATICA, BILATERAL 09/01/2009   Sprain of sacroiliac region 09/01/2009   ANEMIA 11/07/2007   Osteopenia of the elderly 11/07/2007   INSOMNIA 11/07/2007   EDEMA 08/14/2007   Hyperlipidemia 04/04/2007   PANIC DISORDER 04/04/2007   Essential hypertension 04/04/2007   Anxiety state 12/27/2006   Arthropathy 12/27/2006   Home Medication(s) Prior to Admission medications   Medication Sig Start Date End Date Taking? Authorizing Provider  acetaminophen  (TYLENOL ) 500 MG tablet Take 2 tablets (1,000 mg total) by mouth every 8 (eight) hours as needed. Patient taking differently: Take 1,000 mg by mouth in the morning and at bedtime. 01/14/23   Perri DELENA Meliton Mickey., MD  albuterol  (VENTOLIN  HFA) 108 (90 Base) MCG/ACT inhaler INHALE 2 PUFFS BY MOUTH into THE lungs EVERY 4 HOURS AS NEEDED FOR WHEEZING OR SHORTNESS OF BREATH Patient taking differently: Inhale 2 puffs into the lungs every 4 (four) hours as needed for shortness of breath. 10/12/22   McGowen, Aleene DEL, MD  ALPRAZolam  (NIRAVAM ) 0.5 MG dissolvable tablet Take 0.5 mg by mouth every 8 (eight) hours as needed for anxiety.    [provider]  amiodarone  (PACERONE ) 200 MG tablet Take 1 tablet (200 mg total) by mouth 2 (two) times daily for 21 days, THEN 1 tablet (200 mg total) daily. Follow with cardiology for additional recommendations. 02/14/23 04/06/23  Perri DELENA Meliton Mickey., MD  apixaban  (ELIQUIS ) 2.5 MG TABS tablet Take 2.5 mg by mouth 2 (two) times daily.    [provider]  Apoaequorin (PREVAGEN PO) Take 1 tablet by mouth daily.    [provider]  atorvastatin  (LIPITOR) 40 MG  tablet TAKE 1 TABLET BY MOUTH EVERY DAY 11/02/22   McGowen, Aleene DEL, MD  Biotin  5000 MCG TABS Take 5,000 mcg by mouth daily.    [provider]  Calcium  Carbonate (CALCIUM  600 PO) Take 1,200 mg by mouth daily.     [provider]  carboxymethylcellulose (REFRESH PLUS) 0.5 % SOLN Place 2 drops into both eyes daily as needed (dry/irritated eyes.).    [provider]  Coenzyme Q10 200 MG capsule Take 200 mg by mouth daily.    [provider]  Emollient (EUCERIN ADVANCED REPAIR EX) Apply 1 Application topically at bedtime.    [provider]  ferrous sulfate  325 (65 FE) MG tablet Take 1 tablet (325 mg total) by mouth every other day. 02/16/23   Perri DELENA Meliton Mickey., MD  fluticasone  (FLONASE ) 50 MCG/ACT nasal spray Place 2 sprays into both nostrils daily.     [provider]  gabapentin  (NEURONTIN ) 100 MG capsule Take 1 capsule (100 mg total) by mouth 3 (three) times daily. Patient taking differently: Take 100 mg by mouth 2 (two) times daily. 01/14/23   Perri DELENA  Meliton Raddle., MD  hydrocortisone  2.5 % cream APPLY DAILY AS NEEDED TO RASH ON FEET AND LEGS 12/27/22   Jerilynn Lamarr HERO, NP  ipratropium-albuterol  (DUONEB) 0.5-2.5 (3) MG/3ML SOLN Inhale 3 mLs into the lungs at bedtime.    [provider]  JARDIANCE  10 MG TABS tablet TAKE 1 TABLET BY MOUTH EVERY DAY BEFORE BREAKFAST Patient taking differently: Take 10 mg by mouth daily. 11/16/22   Walker, Caitlin S, NP  Melatonin 5 MG CAPS Take 5 mg by mouth at bedtime.    [provider]  metoprolol  tartrate (LOPRESSOR ) 50 MG tablet Take 1 tablet (50 mg total) by mouth 2 (two) times daily. 02/14/23 03/16/23  Perri DELENA Meliton Raddle., MD  Multiple Vitamins-Minerals (CENTRUM SILVER  ULTRA WOMENS) TABS Take 1 tablet by mouth every evening.    [provider]  torsemide  (DEMADEX ) 10 MG tablet Take 2 tablets (20 mg total) by mouth daily. 02/15/23   Trixie Nilda HERO, MD                                                                                                                                     Past Surgical History Past Surgical History:  Procedure Laterality Date   APPENDECTOMY  1966   done during surgery for tubal pregnancy   CARDIOVERSION N/A 06/17/2022   Procedure: CARDIOVERSION;  Surgeon: Lonni Slain, MD;  Location: [facility] INVASIVE CV LAB;  Service: Cardiovascular;  Laterality: N/A;   CARDIOVERSION N/A 07/11/2022   Procedure: CARDIOVERSION;  Surgeon: Cherrie Toribio SAUNDERS, MD;  Location: [facility] INVASIVE CV LAB;  Service: Cardiovascular;  Laterality: N/A;   CARDIOVERSION N/A 02/14/2023   Procedure: CARDIOVERSION;  Surgeon: Raford Riggs, MD;  Location: [facility] INVASIVE CV LAB;  Service: Cardiovascular;  Laterality: N/A;   CATARACT EXTRACTION W/ INTRAOCULAR LENS IMPLANT  2013   bilat   COLONOSCOPY W/ POLYPECTOMY  05/2013   +diverticulosis; recall 3 yrs (Dr. Abran)   DEXA  02/2015; 06/2018   T score -2.1 in both femoral necks; FRAX 10 yr risk of major osteoporotic fracture was 21%---fosamax  started. 06/2018 T score -2.4.  T score 09/2020 -2.1. Rpt 2 yrs.   ECTOPIC PREGNANCY SURGERY     EYE SURGERY     LEFT HEART CATH AND CORONARY ANGIOGRAPHY N/A 09/01/2017   No angiographically significant CAD.  Upper normal left ventricular filling pressure.  Procedure: LEFT HEART CATH AND CORONARY ANGIOGRAPHY;  Surgeon: Mady Lonni, MD;  Location: [facility] INVASIVE CV LAB;  Service: Cardiovascular;  Laterality: N/A;   LUMBAR LAMINECTOMY/DECOMPRESSION MICRODISCECTOMY N/A 09/26/2018   Procedure: Decompressive Lumbar Laminectomy L5 S1 FORAMINOTOMY L5 S1  NERVE ROOT BILATERALLY and Microdiscectomy L5-S1 Left;  Surgeon: Heide Ingle, MD;  Location: [facility] ORS;  Service: Orthopedics;  Laterality: N/A;    OPEN REDUCTION INTERNAL FIXATION (ORIF) TIBIA/FIBULA FRACTURE Left 02/18/2016   Procedure: OPEN REDUCTION INTERNAL FIXATION (ORIF) Right ankle trimalleolar fracture;  Surgeon: Norleen Armor, MD;  Location:  [facility] OR;  Service: Orthopedics;  Laterality: Left;  requests   ORIF RADIAL FRACTURE  11/18/2010   left; s/p slip on slippery floor and fell   THORACENTESIS  08/2017   diagnostic and therapeutic.  Transudative.  Clx neg.  (+pulm edema/diastolic HF)   TONSILLECTOMY     TRANSTHORACIC ECHOCARDIOGRAM  02/18/2016; 08/10/17   LVEF of 55-60%, mild AI and mild MR and normal biatrial size.  07/2017--normal LV function, mild enlarge aortic root, mild/mod TR, bilat atrial enlargement. 06/09/22 EF 50-55%, mod MR, aortic root stable enlgmt.  12/2022 normal LV syst fxn, grd I DD, stable mild MR and aortic root 40 mm   Family History Family History  Problem Relation Age of Onset   Heart disease Mother    Heart disease Father    Hypertension Brother    Diabetes Sister    Colon cancer Neg Hx    Pancreatic cancer Neg Hx    Rectal cancer Neg Hx    Stomach cancer Neg Hx     Social History Social History   Tobacco Use   Smoking status: Never   Smokeless tobacco: Never  Vaping Use   Vaping status: Never Used  Substance Use Topics   Alcohol  use: Yes    Comment: rarely   Drug use: Never   Allergies Augmentin  [amoxicillin -pot clavulanate], Amoxicillin , and Clindamycin/lincomycin  Review of Systems Review of Systems  All other systems reviewed and are negative.   Physical Exam Vital Signs  I have reviewed the triage vital signs BP (!) 87/41 (BP Location: Left Arm)   Pulse 64   Temp (!) 97.4 F (36.3 C) (Oral)   Resp 18   LMP  (LMP Unknown)   SpO2 (!) 86%  Physical Exam Vitals and nursing note reviewed.  Constitutional:      General: She is in acute distress.     Appearance: She is well-developed. She is ill-appearing.  HENT:     Head: Normocephalic and atraumatic.     Mouth/Throat:     Mouth: Mucous membranes are dry.  Eyes:     Pupils: Pupils are equal, round, and reactive to light.  Cardiovascular:     Rate and Rhythm: Normal rate and regular rhythm.      Heart sounds: No murmur heard. Pulmonary:     Effort: Pulmonary effort is normal. No respiratory distress.     Breath sounds: Normal breath sounds.  Abdominal:     General: Abdomen is flat.     Palpations: Abdomen is soft.     Tenderness: There is no abdominal tenderness.  Musculoskeletal:        General: No tenderness.     Right lower leg: No edema.     Left lower leg: No edema.     Comments: Trace LE edema, bilateral erythema, tenderness and warmth  Skin:    General: Skin is warm and dry.  Neurological:     General: No focal deficit present.     Mental Status: She is alert. Mental status is at baseline.  Psychiatric:        Mood and Affect: Mood normal.        Behavior: Behavior normal.     ED Results and Treatments Labs (all labs ordered are listed, but only abnormal results are displayed) Labs Reviewed  CBC WITH DIFFERENTIAL/PLATELET - Abnormal; Notable for the following components:      Result Value   WBC 13.5 (*)    RBC 2.65 (*)    Hemoglobin 7.3 (*)  HCT 24.8 (*)    MCHC 29.4 (*)    RDW 16.1 (*)    Neutro Abs 11.3 (*)    Abs Immature Granulocytes 0.11 (*)    All other components within normal limits  BLOOD GAS, VENOUS - Abnormal; Notable for the following components:   pO2, Ven 55 (*)    Bicarbonate 28.4 (*)    Acid-Base Excess 2.5 (*)    All other components within normal limits  RESP PANEL BY RT-PCR (RSV, FLU A&B, COVID)  RVPGX2  CULTURE, BLOOD (ROUTINE X 2)  CULTURE, BLOOD (ROUTINE X 2)  COMPREHENSIVE METABOLIC PANEL  BRAIN NATRIURETIC PEPTIDE  URINALYSIS, W/ REFLEX TO CULTURE (INFECTION SUSPECTED)  I-STAT CG4 LACTIC ACID, ED  I-STAT CG4 LACTIC ACID, ED  TYPE AND SCREEN  TROPONIN I (HIGH SENSITIVITY)  TROPONIN I (HIGH SENSITIVITY)                                                                                                                          Radiology DG Chest 2 View Result Date: 02/23/2023 CLINICAL DATA:  Worsening bilateral LE  swelling, low O2 sats, Hx of heart failure. EXAM: CHEST - 2 VIEW COMPARISON:  02/13/2023. FINDINGS: Low lung volume. There is blunting of bilateral posterior costophrenic angles, left more than right. There are probable associated compressive atelectatic changes. Bilateral lung fields are otherwise clear. No pneumothorax. No pulmonary edema. Stable cardio-mediastinal silhouette. No acute osseous abnormalities. The soft tissues are within normal limits. IMPRESSION: Bilateral small pleural effusions, left more than right. Electronically Signed   By: Ree Molt M.D.   On: 02/23/2023 15:40    Pertinent labs & imaging results that were available during my care of the patient were reviewed by me and considered in my medical decision making (see MDM for details).  Medications Ordered in ED Medications  cefTRIAXone  (ROCEPHIN ) 2 g in sodium chloride  0.9 % 100 mL IVPB (has no administration in time range)  sodium chloride  0.9 % bolus 1,000 mL (1,000 mLs Intravenous New Bag/Given 02/23/23 1545)                                                                                                                                     Procedures .Critical Care  Performed by: Francesca Elsie CROME, MD Authorized by: Francesca Elsie CROME, MD   Critical care provider statement:    Critical care time (minutes):  30   Critical care time was exclusive of:  Separately billable procedures and treating other patients   Critical care was necessary to treat or prevent imminent or life-threatening deterioration of the following conditions:  Shock and sepsis Ultrasound ED Echo  Date/Time: 02/23/2023 4:41 PM  Performed by: Francesca Elsie CROME, MD Authorized by: Francesca Elsie CROME, MD   Procedure details:    Indications: hypotension     Views: IVC view     Images: not archived   Findings:    IVC: collapsed   Impression:    Impression: probable low CVP     (including critical care time)  Medical Decision Making / ED  Course   MDM:  88 year old presenting to the emergency department with generalized weakness.  On arrival patient noted to be hypoxic, however off oxygen patient remained to have normal oxygen saturation.  Initial vitals were notable for low blood pressure.  On recheck appears somewhat lower.  Requested the patient be moved to a room immediately.  Performed bedside ultrasound which shows collapsible IVC.  Despite history of CHF patient appears more dry than volume overloaded so we will give fluids given hypotension.  Consider underlying sepsis.  Will check labs, urinalysis, chest x-ray.  Source potentially due to the lower extremity cellulitis which patient has had previously.  Seems less likely CHF related given collapsible IVC on ultrasound, dry mucous membranes  Clinical Course as of 02/23/23 1643  Thu Feb 23, 2023  1637 Signed out to oncoming provider pending results of testing. [WS]    Clinical Course User Index [WS] Francesca Elsie CROME, MD     Additional history obtained: -Additional history obtained from ems -External records from outside source obtained and reviewed including: Chart review including previous notes, labs, imaging, consultation notes including recent hospitalization    Lab Tests: -I ordered, reviewed, and interpreted labs.   The pertinent results include:   Labs Reviewed  CBC WITH DIFFERENTIAL/PLATELET - Abnormal; Notable for the following components:      Result Value   WBC 13.5 (*)    RBC 2.65 (*)    Hemoglobin 7.3 (*)    HCT 24.8 (*)    MCHC 29.4 (*)    RDW 16.1 (*)    Neutro Abs 11.3 (*)    Abs Immature Granulocytes 0.11 (*)    All other components within normal limits  BLOOD GAS, VENOUS - Abnormal; Notable for the following components:   pO2, Ven 55 (*)    Bicarbonate 28.4 (*)    Acid-Base Excess 2.5 (*)    All other components within normal limits  RESP PANEL BY RT-PCR (RSV, FLU A&B, COVID)  RVPGX2  CULTURE, BLOOD (ROUTINE X 2)  CULTURE, BLOOD  (ROUTINE X 2)  COMPREHENSIVE METABOLIC PANEL  BRAIN NATRIURETIC PEPTIDE  URINALYSIS, W/ REFLEX TO CULTURE (INFECTION SUSPECTED)  I-STAT CG4 LACTIC ACID, ED  I-STAT CG4 LACTIC ACID, ED  TYPE AND SCREEN  TROPONIN I (HIGH SENSITIVITY)  TROPONIN I (HIGH SENSITIVITY)    Notable for leukocytosis, normal lactate    Imaging Studies ordered: I ordered imaging studies including CXR On my interpretation imaging demonstrates clear lungs  I independently visualized and interpreted imaging. I agree with the radiologist interpretation   Medicines ordered and prescription drug management: Meds ordered this encounter  Medications   sodium chloride  0.9 % bolus 1,000 mL   cefTRIAXone  (ROCEPHIN ) 2 g in sodium chloride  0.9 % 100 mL IVPB    Antibiotic Indication::   Cellulitis    -I  have reviewed the patients home medicines and have made adjustments as needed   Reevaluation: After the interventions noted above, I reevaluated the patient and found that their symptoms have improved  Co morbidities that complicate the patient evaluation  Past Medical History:  Diagnosis Date   Abnormal EKG approx 2008   Nuclear stress test neg;    Acute pancreatitis 09/2020   idiopathic.  +panc psudocyst   Anxiety    with panic   CAP (community acquired pneumonia) 08/2017   Hospitalization for CAP/acute diast HF/rapid a-fib   Cataract    s/p surgery--lens implants   Chronic diastolic heart failure (HCC) 2019   Chronic renal insufficiency, stage 3 (moderate) (HCC) 12/04/2012   Renal u/s when in hosp 08/2017 for CAP/CHF showed symmetric kidneys, echogenicity normal, w/out hydronephrosis. Baseline GFR around 40 ml/min as of 10/2018.   DDD (degenerative disc disease), lumbar    Diverticulosis 2009   Fracture of radial shaft, left, closed 11/16/2010   fell down flight of stairs   History of kidney stones    Hx of adenomatous colonic polyps 2002;2009;2015   surveillance colonoscopy 2009, +polypectomy  done-tubular adenoma w/out high grade.  05/2013 tubular adenomas--recall 3 yrs   Hyperlipidemia    Hypertension    Low TSH level 02/18/2016   T3 norm, T4 mildly elevated--suspected sick euthyroid syndrome.  Repeat labs 06/2016: normal.   Melanoma in situ (HCC) 06/2018   L LL   Osteoarthritis of both knees    viscosupplementation injections helpful 2020/21   Osteopenia    DEXA 08/2010; repeat DEXA 02/2015 worse: fosamax  started.  06/2018 Dexa T score -2.4.  2020 maj osteop fx risk = 24%, Hip fx risk 7.3%. 09/2020 T score -2.1   PAF (paroxysmal atrial fibrillation) (HCC) 02/2016   2018.   DCCV x 2 2024.   Peripheral edema    Pneumonia 2015   hx with sepsis   Rheumatic fever       Dispostion: Disposition decision including need for hospitalization was considered, and patient disposition pending at time of sign out.    Final Clinical Impression(s) / ED Diagnoses Final diagnoses:  Bilateral lower leg cellulitis  Hypotension due to hypovolemia     This chart was dictated using voice recognition software.  Despite best efforts to proofread,  errors can occur which can change the documentation meaning.    Francesca Elsie CROME, MD 02/23/23 [phone]

## 2023-02-23 NOTE — ED Notes (Signed)
 Pt placed on 2lpm o2 via [place] due to sats in the mid 80's

## 2023-02-24 DIAGNOSIS — R6520 Severe sepsis without septic shock: Secondary | ICD-10-CM | POA: Diagnosis not present

## 2023-02-24 DIAGNOSIS — A419 Sepsis, unspecified organism: Secondary | ICD-10-CM | POA: Diagnosis not present

## 2023-02-24 DIAGNOSIS — I480 Paroxysmal atrial fibrillation: Secondary | ICD-10-CM | POA: Diagnosis not present

## 2023-02-24 DIAGNOSIS — L03115 Cellulitis of right lower limb: Principal | ICD-10-CM

## 2023-02-24 DIAGNOSIS — I5033 Acute on chronic diastolic (congestive) heart failure: Secondary | ICD-10-CM | POA: Diagnosis not present

## 2023-02-24 DIAGNOSIS — N179 Acute kidney failure, unspecified: Secondary | ICD-10-CM | POA: Diagnosis not present

## 2023-02-24 LAB — CBC
HCT: 31.1 % — ABNORMAL LOW (ref 36.0–46.0)
Hemoglobin: 8.6 g/dL — ABNORMAL LOW (ref 12.0–15.0)
MCH: 27 pg (ref 26.0–34.0)
MCHC: 27.7 g/dL — ABNORMAL LOW (ref 30.0–36.0)
MCV: 97.5 fL (ref 80.0–100.0)
Platelets: 289 10*3/uL (ref 150–400)
RBC: 3.19 MIL/uL — ABNORMAL LOW (ref 3.87–5.11)
RDW: 16.3 % — ABNORMAL HIGH (ref 11.5–15.5)
WBC: 13.2 10*3/uL — ABNORMAL HIGH (ref 4.0–10.5)
nRBC: 0 % (ref 0.0–0.2)

## 2023-02-24 LAB — BASIC METABOLIC PANEL
Anion gap: 12 (ref 5–15)
BUN: 51 mg/dL — ABNORMAL HIGH (ref 8–23)
CO2: 22 mmol/L (ref 22–32)
Calcium: 8.2 mg/dL — ABNORMAL LOW (ref 8.9–10.3)
Chloride: 108 mmol/L (ref 98–111)
Creatinine, Ser: 2.65 mg/dL — ABNORMAL HIGH (ref 0.44–1.00)
GFR, Estimated: 17 mL/min — ABNORMAL LOW (ref >60)
Glucose, Bld: 108 mg/dL — ABNORMAL HIGH (ref 70–99)
Potassium: 4.4 mmol/L (ref 3.5–5.1)
Sodium: 142 mmol/L (ref 135–145)

## 2023-02-24 LAB — MAGNESIUM: Magnesium: 2.3 mg/dL (ref 1.7–2.4)

## 2023-02-24 LAB — MRSA NEXT GEN BY PCR, NASAL: MRSA by PCR Next Gen: NOT DETECTED

## 2023-02-24 LAB — PHOSPHORUS: Phosphorus: 5 mg/dL — ABNORMAL HIGH (ref 2.5–4.6)

## 2023-02-24 LAB — TROPONIN I (HIGH SENSITIVITY): Troponin I (High Sensitivity): 11 ng/L (ref <18)

## 2023-02-24 MED ORDER — HYDROMORPHONE HCL 1 MG/ML IJ SOLN
0.2500 mg | INTRAMUSCULAR | Status: AC | PRN
  Administered 2023-02-24 (×2): 0.25 mg via INTRAVENOUS
  Filled 2023-02-24 (×3): qty 1

## 2023-02-24 MED ORDER — SODIUM CHLORIDE 0.9 % IV SOLN
2.0000 g | INTRAVENOUS | Status: DC
  Administered 2023-02-24 – 2023-02-28 (×5): 2 g via INTRAVENOUS
  Filled 2023-02-24 (×5): qty 20

## 2023-02-24 MED ORDER — FUROSEMIDE 10 MG/ML IJ SOLN: 40.0000 mg | Freq: Two times a day (BID) | INTRAMUSCULAR | Status: DC

## 2023-02-24 MED ORDER — FUROSEMIDE 10 MG/ML IJ SOLN
80.0000 mg | Freq: Two times a day (BID) | INTRAMUSCULAR | Status: DC
Start: 2023-02-24 — End: 2023-02-25
  Administered 2023-02-24 – 2023-02-25 (×2): 80 mg via INTRAVENOUS
  Filled 2023-02-24 (×2): qty 8

## 2023-02-24 NOTE — Evaluation (Signed)
 Physical Therapy Evaluation Patient Details Name: Brenda Matthews MRN: [id number] DOB: [birth year] Today's Date: 02/24/2023  History of Present Illness  Pt admitted from [facility]* hypotension, severe sepsis, bil LE cellulitis, AKI on CKD, dehydration, bil LE cellulitis.  Pt with hx o fDDD, PAF, arthritic knees, Chronic anemis, chronic LE edema, CKD, DM and CHF  Clinical Impression  Pt admitted as above and presenting with functional mobility limitations 2* significant generalized weakness, decreased activity tolerance, balance deficits and significant bil LE pain.  Patient will benefit from continued inpatient follow up therapy, <3 hours/day.        If plan is discharge home, recommend the following: Two people to help with walking and/or transfers;A lot of help with bathing/dressing/bathroom;Assistance with cooking/housework;Assist for transportation;Help with stairs or ramp for entrance   Can travel by private vehicle   No    Equipment Recommendations None recommended by PT  Recommendations for Other Services       Functional Status Assessment Patient has had a recent decline in their functional status and demonstrates the ability to make significant improvements in function in a reasonable and predictable amount of time.     Precautions / Restrictions Precautions Precautions: Fall Precaution Comments: monitor vitals Restrictions Weight Bearing Restrictions Per Provider Order: No      Mobility  Bed Mobility Overal bed mobility: Needs Assistance Bed Mobility: Supine to Sit, Sit to Supine     Supine to sit: Max assist, +2 for physical assistance Sit to supine: Total assist        Transfers                   General transfer comment: NT - 2* pt pain level    Ambulation/Gait                  Stairs            Wheelchair Mobility     Tilt Bed    Modified Rankin (Stroke Patients Only)       Balance Overall balance assessment: Needs  assistance Sitting-balance support: Feet unsupported, Bilateral upper extremity supported Sitting balance-Leahy Scale: Fair                                       Pertinent Vitals/Pain Pain Assessment Pain Assessment: Faces Faces Pain Scale: Hurts whole lot Pain Location: Bil LEs - increased with dependency Pain Descriptors / Indicators: Grimacing, Guarding, Tender, Moaning Pain Intervention(s): Limited activity within patient's tolerance, Monitored during session, Patient requesting pain meds-RN notified    Home Living Family/patient expects to be discharged to:: Assisted living                 Home Equipment: Shower seat;Cane - single point;Rollator (4 wheels) Additional Comments: pt reports lives in home at ILF in [place], no family present to verify    Prior Function Prior Level of Function : Patient poor historian/Family not available             Mobility Comments: Pt reports she has been working with PT at [employer] ADLs Comments: pt reports ind with self care, 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     Extremity/Trunk Assessment   Upper Extremity Assessment Upper Extremity Assessment: Generalized weakness    Lower Extremity Assessment Lower Extremity Assessment: Generalized weakness;RLE deficits/detail;LLE deficits/detail RLE: Unable to fully assess due to pain LLE: Unable to fully assess due to pain  Cervical / Trunk Assessment Cervical / Trunk Assessment: Kyphotic  Communication   Communication Communication: No apparent difficulties  Cognition Arousal: Alert Behavior During Therapy: Anxious Overall Cognitive Status: No family/caregiver present to determine baseline cognitive functioning                                 General Comments: followed commands well. internally distracted-fixated on what is wrong with me?        General Comments      Exercises     Assessment/Plan    PT Assessment Patient needs continued PT  services  PT Problem List Decreased strength;Decreased activity tolerance;Decreased balance;Decreased mobility;Decreased coordination;Decreased cognition;Decreased knowledge of use of DME;Decreased safety awareness;Cardiopulmonary status limiting activity       PT Treatment Interventions DME instruction;Gait training;Functional mobility training;Therapeutic activities;Therapeutic exercise;Balance training;Neuromuscular re-education;Patient/family education    PT Goals (Current goals can be found in the Care Plan section)  Acute Rehab PT Goals Patient Stated Goal: home PT Goal Formulation: With patient Time For Goal Achievement: 03/09/23 Potential to Achieve Goals: Fair    Frequency Min 1X/week     Co-evaluation               AM-PAC PT 6 Clicks Mobility  Outcome Measure Help needed turning from your back to your side while in a flat bed without using bedrails?: A Lot Help needed moving from lying on your back to sitting on the side of a flat bed without using bedrails?: A Lot Help needed moving to and from a bed to a chair (including a wheelchair)?: Total Help needed standing up from a chair using your arms (e.g., wheelchair or bedside chair)?: Total Help needed to walk in hospital room?: Total Help needed climbing 3-5 steps with a railing? : Total 6 Click Score: 8    End of Session Equipment Utilized During Treatment: Gait belt;Oxygen Activity Tolerance: Patient limited by fatigue;Patient limited by pain Patient left: in bed;with call bell/phone within reach;with bed alarm set Nurse Communication: Mobility status PT Visit Diagnosis: Unsteadiness on feet (R26.81);Muscle weakness (generalized) (M62.81)    Time: 1010-1035 PT Time Calculation (min) (ACUTE ONLY): 25 min   Charges:   PT Evaluation $PT Eval Low Complexity: 1 Low PT Treatments $Therapeutic Activity: 8-22 mins PT General Charges $$ ACUTE PT VISIT: 1 Visit         Brenda Matthews PT Acute  Rehabilitation Services Pager [phone] Office [phone]   Brenda Matthews 02/24/2023, 12:51 PM

## 2023-02-24 NOTE — Progress Notes (Signed)
 PROGRESS NOTE    Brenda Matthews  FMW:[id number] DOB: Jun 02, 1935 DOA: 02/23/2023 PCP: Feliciano Devoria LABOR, MD   Brief Narrative:  Brenda Matthews is a 88 y.o. female with medical history significant for chronic anemia, chronic leg edema, CKD 3B, non-insulin -dependent diabetes, hypertension, hyperlipidemia, atrial fibrillation on Eliquis , anxiety, DDD, and chronic diastolic heart failure who presented from her SNF admission of fever, leg swelling and redness. (Recently hospitalized 1/18-1/29 for CHF exacerbation/Afib RVR). Hospitalist called for admission.   Assessment & Plan:   Principal Problem:   Severe sepsis (HCC) Active Problems:   Bilateral lower leg cellulitis   Acute hypoxic respiratory failure, multifactorial Rule out heart failure exacerbation/volume overload Pleural effusion, right -Cardiology following along, appreciate insight recommendations -Received multiple boluses and IV fluids overnight in the setting of hypotension and sepsis, may benefit from diuresis now that her vitals have stabilized -Continue to wean oxygen as tolerated, desatting minimally today off oxygen into the high 80s  Severe sepsis Hypotension BLE cellulitis -Continue ceftriaxone  given nonpurulent erythema, cultures pending -Hypotension resolved with IV fluids -Bilateral lower extremity edema/erythema questionably noninfectious given above   HFpEF BLE edema -Previous echo December 2024 EF 60 to 65% -Required IV fluids overnight for hypotension in the setting of presumed sepsis.  Would likely need diuresis here in the near future. -See above for ongoing diuretic schedule per cardiology  AKI on CKD 3B -Baseline creatinine labile typically around 1.5 -Creatinine elevated to 2.7 at intake, stable despite IV fluids -Likely prerenal in the setting of hypotension questionable sepsis  A-fib, rate controlled -Status post cardioversion 1/28  -Continue amiodarone  and apixaban   Chronic anemia of chronic  disease -Previously evaluated with normal iron and ferritin folate and B12 levels -Continue to monitor -baseline hemoglobin around 8   HTN, essential Hypotensive at intake in the setting of presumed sepsis, resolved with IV fluids, hold antihypertensives   Resting tremors -Nonspecific, continue to monitor   HLD -Continue statin   Anxiety -Continue as needed Xanax  judiciously given patient's age -would recommend discontinuation of this medication in the near future   Neuropathic pain -Continue gabapentin    Generalized deconditioning In the setting of cellulitis and dehydration PT OT to follow  DVT prophylaxis: apixaban  (ELIQUIS ) tablet 2.5 mg Start: 02/23/23 2215 Place TED hose Start: 02/23/23 1830 apixaban  (ELIQUIS ) tablet 2.5 mg   Code Status:   Code Status: Full Code  Family Communication: None present  Status is: Inpatient  Dispo: The patient is from: [employer] d/c is to: Same              Anticipated d/c date is: 24 to 48 hours              Patient currently not medically stable for discharge  Consultants:  Cardiology  Procedures:  None  Antimicrobials:  Ceftriaxone   Subjective: No acute issues or events overnight denies nausea vomiting diarrhea constipation headache fevers chills chest pain  Objective: Vitals:   02/23/23 2200 02/24/23 0000 02/24/23 0100 02/24/23 0400  BP: 110/82 (!) 127/49    Pulse: 64 72    Resp: (!) 25 (!) 28    Temp:   98.5 F (36.9 C) 98.3 F (36.8 C)  TempSrc:   Oral Oral  SpO2: 95% 99%    Weight:       No intake or output data in the 24 hours ending 02/24/23 0653 Filed Weights   02/23/23 2131  Weight:  77.2 kg    Examination:  General:  Pleasantly resting in bed, No acute distress. HEENT:  Normocephalic atraumatic.  Sclerae nonicteric, noninjected.  Extraocular movements intact bilaterally. Neck:  Without mass or deformity.  Trachea is midline. Lungs:  Clear to auscultate bilaterally  without rhonchi, wheeze, or rales. Heart:  Regular rate and rhythm.  Without murmurs, rubs, or gallops. Abdomen:  Soft, nontender, nondistended.  Without guarding or rebound. Extremities: Bilateral lower extremity edema, right greater than left 3+ versus 1+, noted blanching erythema distal to the knees bilaterally; erythema most notable at the right ankle near healing excoriation Skin:  Warm and dry, no erythema.  Data Reviewed: I have personally reviewed following labs and imaging studies  CBC: Recent Labs  Lab 02/23/23 1539 02/24/23 0339  WBC 13.5* 13.2*  NEUTROABS 11.3*  --   HGB 7.3* 8.6*  HCT 24.8* 31.1*  MCV 93.6 97.5  PLT 269 289   Basic Metabolic Panel: Recent Labs  Lab 02/23/23 1539 02/24/23 0339  NA 140 142  K 4.3 4.4  CL 104 108  CO2 26 22  GLUCOSE 94 108*  BUN 56* 51*  CREATININE 2.69* 2.65*  CALCIUM  8.3* 8.2*  MG  --  2.3  PHOS  --  5.0*   GFR: Estimated Creatinine Clearance: 14.3 mL/min (A) (by C-G formula based on SCr of 2.65 mg/dL (H)). Liver Function Tests: Recent Labs  Lab 02/23/23 1539  AST 49*  ALT 35  ALKPHOS 76  BILITOT 0.5  PROT 5.2*  ALBUMIN 1.9*   Sepsis Labs: Recent Labs  Lab 02/23/23 1552  LATICACIDVEN 0.7   Recent Results (from the past 240 hours)  Culture, blood (routine x 2)     Status: None (Preliminary result)   Collection Time: 02/23/23  3:39 PM   Specimen: BLOOD LEFT HAND  Result Value Ref Range Status   Specimen Description   Final    BLOOD LEFT HAND Performed at [facility], [address], [place], [place] [address]    Special Requests   Final    BOTTLES DRAWN AEROBIC AND ANAEROBIC Blood Culture adequate volume Performed at [facility], [address], [place], [place] [address]    Culture   Final    NO GROWTH < 12 HOURS Performed at [facility], [address] [place], [place] [address]    Report Status PENDING  Incomplete  Culture, blood (routine x 2)     Status: None  (Preliminary result)   Collection Time: 02/23/23  3:55 PM   Specimen: BLOOD RIGHT WRIST  Result Value Ref Range Status   Specimen Description   Final    BLOOD RIGHT WRIST Performed at [facility], [address] [place], [place] [address]    Special Requests   Final    BOTTLES DRAWN AEROBIC AND ANAEROBIC Blood Culture adequate volume Performed at [facility], [address] [place], [place] [address]    Culture   Final    NO GROWTH < 12 HOURS Performed at [facility], [address], [place], [place] [address]    Report Status PENDING  Incomplete  Resp panel by RT-PCR (RSV, Flu A&B, Covid) Anterior Nasal Swab     Status: None   Collection Time: 02/23/23  6:56 PM   Specimen: Anterior Nasal Swab  Result Value Ref Range Status   SARS Coronavirus 2 by RT PCR NEGATIVE NEGATIVE Final    Comment: (NOTE) SARS-CoV-2 target nucleic acids  are NOT DETECTED.  The SARS-CoV-2 RNA is generally detectable in upper respiratory specimens during the acute phase of infection. The lowest concentration of SARS-CoV-2 viral copies this assay can detect is 138 copies/mL. A negative result does not preclude SARS-Cov-2 infection and should not be used as the sole basis for treatment or other patient management decisions. A negative result may occur with  improper specimen collection/handling, submission of specimen other than nasopharyngeal swab, presence of viral mutation(s) within the areas targeted by this assay, and inadequate number of viral copies(<138 copies/mL). A negative result must be combined with clinical observations, patient history, and epidemiological information. The expected result is Negative.  Fact Sheet for Patients:  bloggercourse.com  Fact Sheet for Healthcare Providers:  seriousbroker.it  This test is no t yet approved or cleared by the [place]  FDA and  has been authorized for  detection and/or diagnosis of SARS-CoV-2 by FDA under an Emergency Use Authorization (EUA). This EUA will remain  in effect (meaning this test can be used) for the duration of the COVID-19 declaration under Section 564(b)(1) of the Act, 21 U.S.C.section 360bbb-3(b)(1), unless the authorization is terminated  or revoked sooner.       Influenza A by PCR NEGATIVE NEGATIVE Final   Influenza B by PCR NEGATIVE NEGATIVE Final    Comment: (NOTE) The Xpert Xpress SARS-CoV-2/FLU/RSV plus assay is intended as an aid in the diagnosis of influenza from Nasopharyngeal swab specimens and should not be used as a sole basis for treatment. Nasal washings and aspirates are unacceptable for Xpert Xpress SARS-CoV-2/FLU/RSV testing.  Fact Sheet for Patients: bloggercourse.com  Fact Sheet for Healthcare Providers: seriousbroker.it  This test is not yet approved or cleared by the [place]  FDA and has been authorized for detection and/or diagnosis of SARS-CoV-2 by FDA under an Emergency Use Authorization (EUA). This EUA will remain in effect (meaning this test can be used) for the duration of the COVID-19 declaration under Section 564(b)(1) of the Act, 21 U.S.C. section 360bbb-3(b)(1), unless the authorization is terminated or revoked.     Resp Syncytial Virus by PCR NEGATIVE NEGATIVE Final    Comment: (NOTE) Fact Sheet for Patients: bloggercourse.com  Fact Sheet for Healthcare Providers: seriousbroker.it  This test is not yet approved or cleared by the [place]  FDA and has been authorized for detection and/or diagnosis of SARS-CoV-2 by FDA under an Emergency Use Authorization (EUA). This EUA will remain in effect (meaning this test can be used) for the duration of the COVID-19 declaration under Section 564(b)(1) of the Act, 21 U.S.C. section 360bbb-3(b)(1), unless the authorization is  terminated or revoked.  Performed at [facility], [address], [place], [place] [address]   MRSA Next Gen by PCR, Nasal     Status: None   Collection Time: 02/24/23  1:17 AM   Specimen: Nasal Mucosa; Nasal Swab  Result Value Ref Range Status   MRSA by PCR Next Gen NOT DETECTED NOT DETECTED Final    Comment: (NOTE) The GeneXpert MRSA Assay (FDA approved for NASAL specimens only), is one component of a comprehensive MRSA colonization surveillance program. It is not intended to diagnose MRSA infection nor to guide or monitor treatment for MRSA infections. Test performance is not FDA approved in patients less than 71 years old. Performed at [facility] [address] [place], [place] [address]     Radiology Studies: DG Chest 2 View Result Date: 02/23/2023 CLINICAL DATA:  Worsening bilateral LE swelling, low O2 sats,  Hx of heart failure. EXAM: CHEST - 2 VIEW COMPARISON:  02/13/2023. FINDINGS: Low lung volume. There is blunting of bilateral posterior costophrenic angles, left more than right. There are probable associated compressive atelectatic changes. Bilateral lung fields are otherwise clear. No pneumothorax. No pulmonary edema. Stable cardio-mediastinal silhouette. No acute osseous abnormalities. The soft tissues are within normal limits. IMPRESSION: Bilateral small pleural effusions, left more than right. Electronically Signed   By: Ree Molt M.D.   On: 02/23/2023 15:40   Scheduled Meds:  sodium chloride    Intravenous Once   amiodarone   200 mg Oral BID   Followed by   NOREEN ON 03/07/2023] amiodarone   200 mg Oral Daily   apixaban   2.5 mg Oral BID   atorvastatin   40 mg Oral Daily   calcium  carbonate  1,250 mg Oral Daily   Chlorhexidine  Gluconate Cloth  6 each Topical Daily   fluticasone   2 spray Each Nare Daily   gabapentin   100 mg Oral BID   Continuous Infusions:  cefTRIAXone  (ROCEPHIN )  IV      LOS: 1 day   Time spent:   Elsie JAYSON Montclair, DO [facility]  If 7PM-7AM, please contact night-coverage [email or web address]  02/24/2023, 6:53 AM

## 2023-02-24 NOTE — TOC Initial Note (Signed)
 Transition of Care [facility]) - Initial/Assessment Note   Patient Details  Name: Brenda Matthews MRN: [id number] Date of Birth: 1935-11-05  Transition of Care [facility]) CM/SW Contact:    Duwaine GORMAN Aran, LCSW Phone Number: 02/24/2023, 12:54 PM  Clinical Narrative: Patient is a resident at [facility]. Patient was recently admitted and discharged to [facility]'s SNF on 02/15/23. PT/OT consulted. TOC awaiting recommendations.  Expected Discharge Plan: Skilled Nursing Facility Barriers to Discharge: Continued Medical Work up  Expected Discharge Plan and Services In-house Referral: Clinical Social Work Living arrangements for the past 2 months: Independent Living Facility  Prior Living Arrangements/Services Living arrangements for the past 2 months: Independent Living Facility Lives with:: Self Patient language and need for interpreter reviewed:: Yes Do you feel safe going back to the place where you live?: Yes      Need for Family Participation in Patient Care: No (Comment) Care giver support system in place?: Yes (comment) Criminal Activity/Legal Involvement Pertinent to Current Situation/Hospitalization: No - Comment as needed  Activities of Daily Living ADL Screening (condition at time of admission) Independently performs ADLs?: No Does the patient have a NEW difficulty with bathing/dressing/toileting/self-feeding that is expected to last >3 days?: No Does the patient have a NEW difficulty with getting in/out of bed, walking, or climbing stairs that is expected to last >3 days?: Yes (Initiates electronic notice to provider for possible PT consult) Does the patient have a NEW difficulty with communication that is expected to last >3 days?: No Is the patient deaf or have difficulty hearing?: No Does the patient have difficulty seeing, even when wearing glasses/contacts?: No Does the patient have difficulty concentrating, remembering, or making decisions?: No  Emotional Assessment Alcohol  /  Substance Use: Not Applicable Psych Involvement: No (comment)  Admission diagnosis:  Bilateral lower leg cellulitis [L03.116, L03.115] Severe sepsis (HCC) [A41.9, R65.20] Hypotension due to hypovolemia [E86.1] Patient Active Problem List   Diagnosis Date Noted   Bilateral lower leg cellulitis 02/24/2023   Severe sepsis (HCC) 02/23/2023   Acute diastolic (congestive) heart failure (HCC) 02/04/2023   Depression 01/11/2023   Cellulitis of right leg 01/09/2023   Hypokalemia 01/09/2023   Constipation 01/09/2023   Acute pulmonary edema (HCC) 01/03/2023   Acute respiratory failure with hypoxemia (HCC) 01/02/2023   High risk medication use 11/07/2022   Acute bilateral low back pain without sciatica 11/07/2022   ARF (acute renal failure) (HCC) 10/23/2022   Acute renal failure superimposed on stage 3b chronic kidney disease (HCC) 10/22/2022   Chronic heart failure with preserved ejection fraction (HFpEF) (HCC) 10/22/2022   Elevated transaminase level 10/22/2022   Physical deconditioning 06/14/2022   Acute on chronic diastolic (congestive) heart failure (HCC) 06/11/2022   Idiopathic acute pancreatitis without necrosis or infection 09/14/2020   Pancreatic cyst 09/14/2020   Pancreatic duct dilated 09/14/2020   Pure hypercholesterolemia 09/14/2020   Hypertensive heart and chronic kidney disease with heart failure and stage 1 through stage 4 chronic kidney disease, or unspecified chronic kidney disease (HCC) 09/28/2018   Spinal stenosis, lumbar region with neurogenic claudication 09/26/2018   Osteoarthritis of right knee 11/21/2017   Acute on chronic diastolic CHF (congestive heart failure) (HCC)    S/P thoracentesis    Acute respiratory failure (HCC)    Rash in adult 08/30/2017   Atrial fibrillation and flutter (HCC)    DOE (dyspnea on exertion) 08/29/2017   Arterial hypotension    Chronic anticoagulation 12/19/2016   AKI (acute kidney injury) (HCC)    Low TSH level 02/19/2016  PAF  (paroxysmal atrial fibrillation) (HCC) 02/18/2016   Ankle fracture, left-s/p surgery 02/18/16 02/18/2016   Closed displaced trimalleolar fracture of left ankle 02/18/2016   Acute bronchitis 02/10/2015   Bronchopneumonia 12/02/2013   Chronic kidney disease, stage 3b (HCC) 12/04/2012   Hx of adenomatous colonic polyps    Routine general medical examination at a health care facility 09/05/2011   Cervical cancer screening 02/24/2011   SCIATICA, BILATERAL 09/01/2009   Sprain of sacroiliac region 09/01/2009   ANEMIA 11/07/2007   Osteopenia of the elderly 11/07/2007   INSOMNIA 11/07/2007   EDEMA 08/14/2007   Hyperlipidemia 04/04/2007   PANIC DISORDER 04/04/2007   Essential hypertension 04/04/2007   Anxiety state 12/27/2006   Arthropathy 12/27/2006   PCP:  Feliciano Devoria LABOR, MD Pharmacy:   [facility] - [place], [place] - [address] [place] [address] Phone: [phone] Fax: [phone]  [facility] - [place], [place] - [address] [place] [address] Phone: [phone] Fax: [phone]  Social Drivers of Health (SDOH) Social History: SDOH Screenings   Food Insecurity: No Food Insecurity (02/23/2023)  Housing: Low Risk  (02/23/2023)  Transportation Needs: No Transportation Needs (02/23/2023)  Utilities: Not At Risk (02/23/2023)  Alcohol  Screen: Low Risk  (10/11/2020)  Depression (PHQ2-9): Low Risk  (09/01/2022)  Financial Resource Strain: Low Risk  (10/27/2021)  Physical Activity: Sufficiently Active (10/27/2021)  Social Connections: Moderately Integrated (02/23/2023)  Stress: No Stress Concern Present (10/27/2021)  Tobacco Use: Low Risk  (02/23/2023)   SDOH Interventions:    Readmission Risk Interventions    02/24/2023   12:52 PM 02/06/2023    2:09 PM 10/24/2022    9:08 AM  Readmission Risk Prevention Plan  Post Dischage Appt   Complete  Medication Screening   Complete  Transportation Screening  Complete Complete Complete  HRI or Home Care Consult  Complete   Social Work Consult for Recovery Care Planning/Counseling  Complete   Palliative Care Screening  Not Applicable   Medication Review Oceanographer) Complete Complete   HRI or Home Care Consult Complete    SW Recovery Care/Counseling Consult Complete    Palliative Care Screening Not Applicable    Skilled Nursing Facility Complete

## 2023-02-24 NOTE — Telephone Encounter (Addendum)
 Hazle Lites, MD     Returned family call - patient admitted at [facility] - I will see her in consult and address her concerns.  Dr. Marvina Slough

## 2023-02-24 NOTE — Consult Note (Addendum)
 CONSULTATION NOTE   Patient Name: Brenda Matthews Date of Encounter: 02/24/2023 Cardiologist: Vinie JAYSON Maxcy, MD Electrophysiologist: None Advanced Heart Failure: None   Chief Complaint   Shortness of breath  Patient Profile   88 yo female with history of paroxysmal atrial fibrillation and multiple recent cardioversions, including last month during admission, now presents with progressive dyspnea and 10 lb weight gain suggestive of heart failure.  HPI   Brenda Matthews is a 88 y.o. female who is being seen today for the evaluation of CHF at the request of Dr. Lue. This is a pleasant 88 year old female patient that I follow in the office but I last saw her in 2021.  She has been seen more recently by our APP's.  She was recently hospitalized in January.  At that time she was found to be in acute congestive heart failure and A-fib with RVR.  She was diuresed and responded well to IV Lasix .  She underwent elective cardioversion on 02/14/2023 and achieved sinus rhythm.  She was therefore felt to be euvolemic and maintained on amiodarone  with a taper as well as Eliquis  2.5 mg twice daily, metoprolol , Jardiance  and torsemide  20 mg daily.  After discharge, apparently she has developed worsening edema and there is a reported 10 pound weight gain with progressive dyspnea on exertion.  She was hypotensive on admission in the ER and received a liter of saline bolus.  PCCM was involved and recommended additional IV fluids and antibiotics for possible sepsis.  She appears to be maintaining sinus rhythm.  Admission EKG is not yet posted.  Pertinent labs showed bicarb of 28, creatinine 2.65, up from 1.54 at the end of her prior admission.  Of note her creatinine improved with diuresis during her prior admission.  BNP now elevated 650, lactate 0.7.  Chest x-ray shows small bilateral pleural effusions and left greater than right.  She is recorded about 1 L positive yesterday.  Recent echo in December 2024  demonstrated preserved LVEF 6065% and grade 1 diastolic dysfunction with moderate biatrial enlargement and mildly dilated ascending aorta to 40 mm. Admission weight was 77.2 kg and discharge weight recently was 77.2 kg, however, her weight was documented to go up 6 lbs at her assisted living facility.  PMHx   Past Medical History:  Diagnosis Date   Abnormal EKG approx 2008   Nuclear stress test neg;    Acute pancreatitis 09/2020   idiopathic.  +panc psudocyst   Anxiety    with panic   CAP (community acquired pneumonia) 08/2017   Hospitalization for CAP/acute diast HF/rapid a-fib   Cataract    s/p surgery--lens implants   Chronic diastolic heart failure (HCC) 2019   Chronic renal insufficiency, stage 3 (moderate) (HCC) 12/04/2012   Renal u/s when in hosp 08/2017 for CAP/CHF showed symmetric kidneys, echogenicity normal, w/out hydronephrosis. Baseline GFR around 40 ml/min as of 10/2018.   DDD (degenerative disc disease), lumbar    Diverticulosis 2009   Fracture of radial shaft, left, closed 11/16/2010   fell down flight of stairs   History of kidney stones    Hx of adenomatous colonic polyps 2002;2009;2015   surveillance colonoscopy 2009, +polypectomy done-tubular adenoma w/out high grade.  05/2013 tubular adenomas--recall 3 yrs   Hyperlipidemia    Hypertension    Low TSH level 02/18/2016   T3 norm, T4 mildly elevated--suspected sick euthyroid syndrome.  Repeat labs 06/2016: normal.   Melanoma in situ (HCC) 06/2018   L LL  Osteoarthritis of both knees    viscosupplementation injections helpful 2020/21   Osteopenia    DEXA 08/2010; repeat DEXA 02/2015 worse: fosamax  started.  06/2018 Dexa T score -2.4.  2020 maj osteop fx risk = 24%, Hip fx risk 7.3%. 09/2020 T score -2.1   PAF (paroxysmal atrial fibrillation) (HCC) 02/2016   2018.   DCCV x 2 2024.   Peripheral edema    Pneumonia 2015   hx with sepsis   Rheumatic fever     Past Surgical History:  Procedure Laterality Date    APPENDECTOMY  1966   done during surgery for tubal pregnancy   CARDIOVERSION N/A 06/17/2022   Procedure: CARDIOVERSION;  Surgeon: Lonni Slain, MD;  Location: [facility] INVASIVE CV LAB;  Service: Cardiovascular;  Laterality: N/A;   CARDIOVERSION N/A 07/11/2022   Procedure: CARDIOVERSION;  Surgeon: Cherrie Toribio SAUNDERS, MD;  Location: [facility] INVASIVE CV LAB;  Service: Cardiovascular;  Laterality: N/A;   CARDIOVERSION N/A 02/14/2023   Procedure: CARDIOVERSION;  Surgeon: Raford Riggs, MD;  Location: [facility] INVASIVE CV LAB;  Service: Cardiovascular;  Laterality: N/A;   CATARACT EXTRACTION W/ INTRAOCULAR LENS IMPLANT  2013   bilat   COLONOSCOPY W/ POLYPECTOMY  05/2013   +diverticulosis; recall 3 yrs (Dr. Abran)   DEXA  02/2015; 06/2018   T score -2.1 in both femoral necks; FRAX 10 yr risk of major osteoporotic fracture was 21%---fosamax  started. 06/2018 T score -2.4.  T score 09/2020 -2.1. Rpt 2 yrs.   ECTOPIC PREGNANCY SURGERY     EYE SURGERY     LEFT HEART CATH AND CORONARY ANGIOGRAPHY N/A 09/01/2017   No angiographically significant CAD.  Upper normal left ventricular filling pressure.  Procedure: LEFT HEART CATH AND CORONARY ANGIOGRAPHY;  Surgeon: Mady Lonni, MD;  Location: [facility] INVASIVE CV LAB;  Service: Cardiovascular;  Laterality: N/A;   LUMBAR LAMINECTOMY/DECOMPRESSION MICRODISCECTOMY N/A 09/26/2018   Procedure: Decompressive Lumbar Laminectomy L5 S1 FORAMINOTOMY L5 S1  NERVE ROOT BILATERALLY and Microdiscectomy L5-S1 Left;  Surgeon: Heide Ingle, MD;  Location: [facility] ORS;  Service: Orthopedics;  Laterality: N/A;    OPEN REDUCTION INTERNAL FIXATION (ORIF) TIBIA/FIBULA FRACTURE Left 02/18/2016   Procedure: OPEN REDUCTION INTERNAL FIXATION (ORIF) Right ankle trimalleolar fracture;  Surgeon: Norleen Armor, MD;  Location: [facility] OR;  Service: Orthopedics;  Laterality: Left;  requests   ORIF RADIAL FRACTURE  11/18/2010   left; s/p slip on slippery floor and fell   THORACENTESIS  08/2017    diagnostic and therapeutic.  Transudative.  Clx neg.  (+pulm edema/diastolic HF)   TONSILLECTOMY     TRANSTHORACIC ECHOCARDIOGRAM  02/18/2016; 08/10/17   LVEF of 55-60%, mild AI and mild MR and normal biatrial size.  07/2017--normal LV function, mild enlarge aortic root, mild/mod TR, bilat atrial enlargement. 06/09/22 EF 50-55%, mod MR, aortic root stable enlgmt.  12/2022 normal LV syst fxn, grd I DD, stable mild MR and aortic root 40 mm    FAMHx   Family History  Problem Relation Age of Onset   Heart disease Mother    Heart disease Father    Hypertension Brother    Diabetes Sister    Colon cancer Neg Hx    Pancreatic cancer Neg Hx    Rectal cancer Neg Hx    Stomach cancer Neg Hx     SOCHx    reports that she has never smoked. She has never used smokeless tobacco. She reports current alcohol  use. She reports that she does not use drugs.  Outpatient Medications  No current facility-administered medications on file prior to encounter.   Current Outpatient Medications on File Prior to Encounter  Medication Sig Dispense Refill   acetaminophen  (TYLENOL ) 500 MG tablet Take 2 tablets (1,000 mg total) by mouth every 8 (eight) hours as needed. (Patient taking differently: Take 1,000 mg by mouth in the morning and at bedtime.) 30 tablet 0   albuterol  (VENTOLIN  HFA) 108 (90 Base) MCG/ACT inhaler INHALE 2 PUFFS BY MOUTH into THE lungs EVERY 4 HOURS AS NEEDED FOR WHEEZING OR SHORTNESS OF BREATH (Patient taking differently: Inhale 2 puffs into the lungs every 4 (four) hours as needed for shortness of breath.) 18 g 1   ALPRAZolam  (NIRAVAM ) 0.5 MG dissolvable tablet Take 0.5 mg by mouth every 8 (eight) hours as needed for anxiety.     amiodarone  (PACERONE ) 200 MG tablet Take 1 tablet (200 mg total) by mouth 2 (two) times daily for 21 days, THEN 1 tablet (200 mg total) daily. Follow with cardiology for additional recommendations. 72 tablet 0   apixaban  (ELIQUIS ) 2.5 MG TABS tablet Take 2.5 mg by  mouth 2 (two) times daily.     Apoaequorin (PREVAGEN PO) Take 1 tablet by mouth daily.     atorvastatin  (LIPITOR) 40 MG tablet TAKE 1 TABLET BY MOUTH EVERY DAY 90 tablet 1   Biotin  5000 MCG TABS Take 5,000 mcg by mouth daily.     Calcium  Carbonate (CALCIUM  600 PO) Take 1,200 mg by mouth daily.      ciprofloxacin  (CIPRO ) 500 MG tablet Take 500 mg by mouth 2 (two) times daily.     Coenzyme Q10 200 MG capsule Take 200 mg by mouth daily.     fluticasone  (FLONASE ) 50 MCG/ACT nasal spray Place 2 sprays into both nostrils daily.      gabapentin  (NEURONTIN ) 100 MG capsule Take 1 capsule (100 mg total) by mouth 3 (three) times daily. (Patient taking differently: Take 100 mg by mouth 2 (two) times daily.)     hydrALAZINE  (APRESOLINE ) 10 MG tablet Take 10 mg by mouth 3 (three) times daily.     [Paused] JARDIANCE  10 MG TABS tablet TAKE 1 TABLET BY MOUTH EVERY DAY BEFORE BREAKFAST (Patient taking differently: Take 10 mg by mouth daily.) 90 tablet 3   metoprolol  tartrate (LOPRESSOR ) 50 MG tablet Take 1 tablet (50 mg total) by mouth 2 (two) times daily. 60 tablet 1   torsemide  (DEMADEX ) 10 MG tablet Take 2 tablets (20 mg total) by mouth daily.     carboxymethylcellulose (REFRESH PLUS) 0.5 % SOLN Place 2 drops into both eyes daily as needed (dry/irritated eyes.). (Patient not taking: Reported on 02/23/2023)     Emollient (EUCERIN ADVANCED REPAIR EX) Apply 1 Application topically at bedtime. (Patient not taking: Reported on 02/23/2023)     ferrous sulfate  325 (65 FE) MG tablet Take 1 tablet (325 mg total) by mouth every other day. (Patient not taking: Reported on 02/23/2023)     hydrocortisone  2.5 % cream APPLY DAILY AS NEEDED TO RASH ON FEET AND LEGS (Patient not taking: Reported on 02/23/2023) 30 g 1   ipratropium-albuterol  (DUONEB) 0.5-2.5 (3) MG/3ML SOLN Inhale 3 mLs into the lungs at bedtime. (Patient not taking: Reported on 02/23/2023)     Melatonin 5 MG CAPS Take 5 mg by mouth at bedtime. (Patient not taking: Reported  on 02/23/2023)     Menthol , Topical Analgesic, 4 % GEL Apply 1 Application topically every 8 (eight) hours as needed (ankles/heels). (Patient not taking: Reported on 02/23/2023)  Multiple Vitamins-Minerals (CENTRUM SILVER  ULTRA WOMENS) TABS Take 1 tablet by mouth every evening. (Patient not taking: Reported on 02/23/2023)      Inpatient Medications    Scheduled Meds:  sodium chloride    Intravenous Once   amiodarone   200 mg Oral BID   Followed by   NOREEN ON 03/07/2023] amiodarone   200 mg Oral Daily   apixaban   2.5 mg Oral BID   atorvastatin   40 mg Oral Daily   calcium  carbonate  1,250 mg Oral Daily   Chlorhexidine  Gluconate Cloth  6 each Topical Daily   fluticasone   2 spray Each Nare Daily   gabapentin   100 mg Oral BID    Continuous Infusions:  cefTRIAXone  (ROCEPHIN )  IV      PRN Meds: acetaminophen  **OR** acetaminophen , albuterol , ALPRAZolam , HYDROmorphone  (DILAUDID ) injection, ondansetron  **OR** ondansetron  (ZOFRAN ) IV, senna-docusate   ALLERGIES   Allergies  Allergen Reactions   Augmentin  [Amoxicillin -Pot Clavulanate] Nausea And Vomiting and Other (See Comments)    projectile vomiting Has patient had a PCN reaction causing immediate rash, facial/tongue/throat swelling, SOB or lightheadedness with hypotension:No Has patient had a PCN reaction causing severe rash involving mucus membranes or skin necrosis:No Has patient had a PCN reaction that required hospitalization:No Has patient had a PCN reaction occurring within the last 10 years:Yes If all of the above answers are NO, then may proceed with Cephalosporin use.    Amoxicillin  Rash   Clindamycin/Lincomycin Rash    ROS   Pertinent items noted in HPI and remainder of comprehensive ROS otherwise negative.  Vitals   Vitals:   02/24/23 1042 02/24/23 1100 02/24/23 1200 02/24/23 1300  BP:  (!) 116/38 (!) 103/33 (!) 110/36  Pulse: 80 72 68 66  Resp: (!) 26 (!) 21 20 18   Temp:  98.1 F (36.7 C)    TempSrc:  Oral     SpO2: 95% 98% 99% 97%  Weight:        Intake/Output Summary (Last 24 hours) at 02/24/2023 1409 Last data filed at 02/24/2023 1042 Gross per 24 hour  Intake 1119 ml  Output 550 ml  Net 569 ml   Filed Weights   02/23/23 2131  Weight: 77.2 kg    Physical Exam   General appearance: alert, appears older than stated age, fatigued, and pale Neck: JVD - 5 cm above sternal notch, no carotid bruit, and thyroid  not enlarged, symmetric, no tenderness/mass/nodules Lungs: diminished breath sounds bibasilar Heart: regular rate and rhythm Abdomen: soft, non-tender; bowel sounds normal; no masses,  no organomegaly Extremities: edema 1-2+, bilateral erythema and rubor, warm to touch Pulses: 2+ and symmetric Skin: bilateral LE rubor and warm to touch Neurologic: Mental status: awake, recognizes me, but confused Psych: Pleasant  Labs   Results for orders placed or performed during the hospital encounter of 02/23/23 (from the past 48 hours)  CBC with Differential     Status: Abnormal   Collection Time: 02/23/23  3:39 PM  Result Value Ref Range   WBC 13.5 (H) 4.0 - 10.5 K/uL   RBC 2.65 (L) 3.87 - 5.11 MIL/uL   Hemoglobin 7.3 (L) 12.0 - 15.0 g/dL   HCT 75.1 (L) 63.9 - 53.9 %   MCV 93.6 80.0 - 100.0 fL   MCH 27.5 26.0 - 34.0 pg   MCHC 29.4 (L) 30.0 - 36.0 g/dL   RDW 83.8 (H) 88.4 - 84.4 %   Platelets 269 150 - 400 K/uL   nRBC 0.0 0.0 - 0.2 %   Neutrophils Relative % 84 %  Neutro Abs 11.3 (H) 1.7 - 7.7 K/uL   Lymphocytes Relative 7 %   Lymphs Abs 1.0 0.7 - 4.0 K/uL   Monocytes Relative 7 %   Monocytes Absolute 0.9 0.1 - 1.0 K/uL   Eosinophils Relative 1 %   Eosinophils Absolute 0.2 0.0 - 0.5 K/uL   Basophils Relative 0 %   Basophils Absolute 0.0 0.0 - 0.1 K/uL   Immature Granulocytes 1 %   Abs Immature Granulocytes 0.11 (H) 0.00 - 0.07 K/uL    Comment: Performed at [facility], [address] [place], [place] [address]  Comprehensive metabolic panel     Status:  Abnormal   Collection Time: 02/23/23  3:39 PM  Result Value Ref Range   Sodium 140 135 - 145 mmol/L   Potassium 4.3 3.5 - 5.1 mmol/L   Chloride 104 98 - 111 mmol/L   CO2 26 22 - 32 mmol/L   Glucose, Bld 94 70 - 99 mg/dL    Comment: Glucose reference range applies only to samples taken after fasting for at least 8 hours.   BUN 56 (H) 8 - 23 mg/dL   Creatinine, Ser 7.30 (H) 0.44 - 1.00 mg/dL   Calcium  8.3 (L) 8.9 - 10.3 mg/dL   Total Protein 5.2 (L) 6.5 - 8.1 g/dL   Albumin 1.9 (L) 3.5 - 5.0 g/dL   AST 49 (H) 15 - 41 U/L   ALT 35 0 - 44 U/L   Alkaline Phosphatase 76 38 - 126 U/L   Total Bilirubin 0.5 0.0 - 1.2 mg/dL   GFR, Estimated 17 (L) >60 mL/min    Comment: (NOTE) Calculated using the CKD-EPI Creatinine Equation (2021)    Anion gap 10 5 - 15    Comment: Performed at [facility], [address], [place], [place] [address]  Brain natriuretic peptide     Status: Abnormal   Collection Time: 02/23/23  3:39 PM  Result Value Ref Range   B Natriuretic Peptide 650.2 (H) 0.0 - 100.0 pg/mL    Comment: Performed at [facility], [address] [place], [place] [address]  Troponin I (High Sensitivity)     Status: None   Collection Time: 02/23/23  3:39 PM  Result Value Ref Range   Troponin I (High Sensitivity) 11 <18 ng/L    Comment: (NOTE) Elevated high sensitivity troponin I (hsTnI) values and significant  changes across serial measurements may suggest ACS but many other  chronic and acute conditions are known to elevate hsTnI results.  Refer to the Links section for chest pain algorithms and additional  guidance. Performed at [facility], [address], [place], [place] [address]   Culture, blood (routine x 2)     Status: None (Preliminary result)   Collection Time: 02/23/23  3:39 PM   Specimen: BLOOD LEFT HAND  Result Value Ref Range   Specimen Description      BLOOD LEFT HAND Performed at [facility],  [address] [place], [place] [address]    Special Requests      BOTTLES DRAWN AEROBIC AND ANAEROBIC Blood Culture adequate volume Performed at [facility], [address], [place], [place] [address]    Culture      NO GROWTH < 12 HOURS Performed at [facility], [address] [place], [place] [address]    Report Status PENDING   Blood gas, venous (at [facility] and [facility])     Status:  Abnormal   Collection Time: 02/23/23  3:39 PM  Result Value Ref Range   pH, Ven 7.38 7.25 - 7.43   pCO2, Ven 48 44 - 60 mmHg   pO2, Ven 55 (H) 32 - 45 mmHg   Bicarbonate 28.4 (H) 20.0 - 28.0 mmol/L   Acid-Base Excess 2.5 (H) 0.0 - 2.0 mmol/L   O2 Saturation 87.5 %   Patient temperature 37.0     Comment: Performed at [facility], [address], [place], [place] [address]  I-Stat Lactic Acid     Status: None   Collection Time: 02/23/23  3:52 PM  Result Value Ref Range   Lactic Acid, Venous 0.7 0.5 - 1.9 mmol/L  Culture, blood (routine x 2)     Status: None (Preliminary result)   Collection Time: 02/23/23  3:55 PM   Specimen: BLOOD RIGHT WRIST  Result Value Ref Range   Specimen Description      BLOOD RIGHT WRIST Performed at [facility], [address], [place], [place] [address]    Special Requests      BOTTLES DRAWN AEROBIC AND ANAEROBIC Blood Culture adequate volume Performed at [facility], [address] [place], [place] [address]    Culture      NO GROWTH < 12 HOURS Performed at [facility], [address] [place], [place] [address]    Report Status PENDING   Type and screen [facility]     Status: None   Collection Time: 02/23/23  4:55 PM  Result Value Ref Range   ABO/RH(D) A POS    Antibody Screen NEG    Sample Expiration      02/26/2023,2359 Performed at [facility], [address] [place], [place] [address]   Resp panel by RT-PCR (RSV, Flu A&B, Covid) Anterior Nasal  Swab     Status: None   Collection Time: 02/23/23  6:56 PM   Specimen: Anterior Nasal Swab  Result Value Ref Range   SARS Coronavirus 2 by RT PCR NEGATIVE NEGATIVE    Comment: (NOTE) SARS-CoV-2 target nucleic acids are NOT DETECTED.  The SARS-CoV-2 RNA is generally detectable in upper respiratory specimens during the acute phase of infection. The lowest concentration of SARS-CoV-2 viral copies this assay can detect is 138 copies/mL. A negative result does not preclude SARS-Cov-2 infection and should not be used as the sole basis for treatment or other patient management decisions. A negative result may occur with  improper specimen collection/handling, submission of specimen other than nasopharyngeal swab, presence of viral mutation(s) within the areas targeted by this assay, and inadequate number of viral copies(<138 copies/mL). A negative result must be combined with clinical observations, patient history, and epidemiological information. The expected result is Negative.  Fact Sheet for Patients:  bloggercourse.com  Fact Sheet for Healthcare Providers:  seriousbroker.it  This test is no t yet approved or cleared by the [place]  FDA and  has been authorized for detection and/or diagnosis of SARS-CoV-2 by FDA under an Emergency Use Authorization (EUA). This EUA will remain  in effect (meaning this test can be used) for the duration of the COVID-19 declaration under Section 564(b)(1) of the Act, 21 U.S.C.section 360bbb-3(b)(1), unless the authorization is terminated  or revoked sooner.       Influenza A by PCR NEGATIVE NEGATIVE   Influenza B by PCR NEGATIVE NEGATIVE    Comment: (NOTE) The Xpert Xpress SARS-CoV-2/FLU/RSV plus assay is intended as an  aid in the diagnosis of influenza from Nasopharyngeal swab specimens and should not be used as a sole basis for treatment. Nasal washings and aspirates are unacceptable for  Xpert Xpress SARS-CoV-2/FLU/RSV testing.  Fact Sheet for Patients: bloggercourse.com  Fact Sheet for Healthcare Providers: seriousbroker.it  This test is not yet approved or cleared by the [place]  FDA and has been authorized for detection and/or diagnosis of SARS-CoV-2 by FDA under an Emergency Use Authorization (EUA). This EUA will remain in effect (meaning this test can be used) for the duration of the COVID-19 declaration under Section 564(b)(1) of the Act, 21 U.S.C. section 360bbb-3(b)(1), unless the authorization is terminated or revoked.     Resp Syncytial Virus by PCR NEGATIVE NEGATIVE    Comment: (NOTE) Fact Sheet for Patients: bloggercourse.com  Fact Sheet for Healthcare Providers: seriousbroker.it  This test is not yet approved or cleared by the [place]  FDA and has been authorized for detection and/or diagnosis of SARS-CoV-2 by FDA under an Emergency Use Authorization (EUA). This EUA will remain in effect (meaning this test can be used) for the duration of the COVID-19 declaration under Section 564(b)(1) of the Act, 21 U.S.C. section 360bbb-3(b)(1), unless the authorization is terminated or revoked.  Performed at [facility], [address] [place], [place] [address]   MRSA Next Gen by PCR, Nasal     Status: None   Collection Time: 02/24/23  1:17 AM   Specimen: Nasal Mucosa; Nasal Swab  Result Value Ref Range   MRSA by PCR Next Gen NOT DETECTED NOT DETECTED    Comment: (NOTE) The GeneXpert MRSA Assay (FDA approved for NASAL specimens only), is one component of a comprehensive MRSA colonization surveillance program. It is not intended to diagnose MRSA infection nor to guide or monitor treatment for MRSA infections. Test performance is not FDA approved in patients less than 79 years old. Performed at [facility],  [address] [place], [place] [address]   Troponin I (High Sensitivity)     Status: None   Collection Time: 02/24/23  3:39 AM  Result Value Ref Range   Troponin I (High Sensitivity) 11 <18 ng/L    Comment: (NOTE) Elevated high sensitivity troponin I (hsTnI) values and significant  changes across serial measurements may suggest ACS but many other  chronic and acute conditions are known to elevate hsTnI results.  Refer to the Links section for chest pain algorithms and additional  guidance. Performed at [facility], [address] [place], [place] [address]   CBC     Status: Abnormal   Collection Time: 02/24/23  3:39 AM  Result Value Ref Range   WBC 13.2 (H) 4.0 - 10.5 K/uL   RBC 3.19 (L) 3.87 - 5.11 MIL/uL   Hemoglobin 8.6 (L) 12.0 - 15.0 g/dL   HCT 68.8 (L) 63.9 - 53.9 %   MCV 97.5 80.0 - 100.0 fL   MCH 27.0 26.0 - 34.0 pg   MCHC 27.7 (L) 30.0 - 36.0 g/dL   RDW 83.6 (H) 88.4 - 84.4 %   Platelets 289 150 - 400 K/uL   nRBC 0.0 0.0 - 0.2 %    Comment: Performed at [facility], [address], [place], [place] [address]  Basic metabolic panel     Status: Abnormal   Collection Time: 02/24/23  3:39 AM  Result Value Ref Range   Sodium 142 135 - 145 mmol/L   Potassium 4.4 3.5 - 5.1 mmol/L  Chloride 108 98 - 111 mmol/L   CO2 22 22 - 32 mmol/L   Glucose, Bld 108 (H) 70 - 99 mg/dL    Comment: Glucose reference range applies only to samples taken after fasting for at least 8 hours.   BUN 51 (H) 8 - 23 mg/dL   Creatinine, Ser 7.34 (H) 0.44 - 1.00 mg/dL   Calcium  8.2 (L) 8.9 - 10.3 mg/dL   GFR, Estimated 17 (L) >60 mL/min    Comment: (NOTE) Calculated using the CKD-EPI Creatinine Equation (2021)    Anion gap 12 5 - 15    Comment: Performed at [facility], [address], [place], [place] [address]  Magnesium      Status: None   Collection Time: 02/24/23  3:39 AM  Result Value Ref Range   Magnesium  2.3 1.7 - 2.4  mg/dL    Comment: Performed at [facility], [address] [place], [place] [address]  Phosphorus     Status: Abnormal   Collection Time: 02/24/23  3:39 AM  Result Value Ref Range   Phosphorus 5.0 (H) 2.5 - 4.6 mg/dL    Comment: Performed at [facility], [address] [place], [place] [address]    ECG   Pending - Personally Reviewed  Telemetry   Normal sinus rhythm- Personally Reviewed  Radiology   DG Chest 2 View Result Date: 02/23/2023 CLINICAL DATA:  Worsening bilateral LE swelling, low O2 sats, Hx of heart failure. EXAM: CHEST - 2 VIEW COMPARISON:  02/13/2023. FINDINGS: Low lung volume. There is blunting of bilateral posterior costophrenic angles, left more than right. There are probable associated compressive atelectatic changes. Bilateral lung fields are otherwise clear. No pneumothorax. No pulmonary edema. Stable cardio-mediastinal silhouette. No acute osseous abnormalities. The soft tissues are within normal limits. IMPRESSION: Bilateral small pleural effusions, left more than right. Electronically Signed   By: Ree Molt M.D.   On: 02/23/2023 15:40    Cardiac Studies   N/A  Impression   Principal Problem:   Severe sepsis (HCC) Active Problems:   PAF (paroxysmal atrial fibrillation) (HCC)   Acute on chronic diastolic heart failure (HCC)   Acute renal failure superimposed on stage 3b chronic kidney disease (HCC)   Bilateral lower leg cellulitis   Recommendation   Ms. [place] was recently admitted for acute on chronic diastolic congestive heart failure in the setting of afib/RVR - creatinine improved with diuresis and she was cardioverted on amiodarone . She is maintaining sinus rhythm, but may have developed cellulitis which could have exacerbated her heart failure or possibly she was under-diuresed (switched to 20 mg torsemide  daily). Previously she had been on 20 mg furosemide  daily. She also started on jardiance  after  discharge. She is volume up on my exam. Will start IV lasix  80 mg BID - she responded well to IV lasix  recently. Renal function will not allow additional GDMT at this time for HF.  Monitor urine output and renal function closely with diuresis - if creatinine worsens, may be low output or intrinsic renal failure. Introduced the idea of palliative care services to the patient and family - they had concerns about her ability to go back to independent living, which she wants to. Fortunately, she has options at [facility]. Currently she is FULL CODE, but she has advanced directive paperwork, although could not definitively say she  wants to be DNR here (and she was somewhat drowsy from a recent dose of dilaudid ).  Thanks for the consultation.  Cardiology will follow with you.  Time Spent Directly with Patient:  I have spent a total of 45 minutes with the patient reviewing hospital notes, telemetry, EKGs, labs and examining the patient as well as establishing an assessment and plan that was discussed personally with the patient.  > 50% of time was spent in direct patient care.  Length of Stay:  LOS: 1 day   Vinie KYM Maxcy, MD, [facility], FACP  [place]  [facility]  Medical Director of the Advanced Lipid Disorders &  Cardiovascular Risk Reduction Clinic Diplomate of the American Board of Clinical Lipidology Attending Cardiologist  Direct Dial: [phone]  Fax: [phone]  Website:  [email or web address] Vinie BROCKS Leonarda Leis 02/24/2023, 2:09 PM

## 2023-02-24 NOTE — Plan of Care (Signed)
 Patient educated on pain medications and plan of care for her hospital stay. Alert and oriented and able to teach back.

## 2023-02-25 ENCOUNTER — Other Ambulatory Visit: Payer: Self-pay

## 2023-02-25 DIAGNOSIS — R6520 Severe sepsis without septic shock: Secondary | ICD-10-CM | POA: Diagnosis not present

## 2023-02-25 DIAGNOSIS — I5032 Chronic diastolic (congestive) heart failure: Secondary | ICD-10-CM

## 2023-02-25 DIAGNOSIS — A419 Sepsis, unspecified organism: Secondary | ICD-10-CM | POA: Diagnosis not present

## 2023-02-25 MED ORDER — HYDROMORPHONE HCL 1 MG/ML IJ SOLN
INTRAMUSCULAR | Status: AC
  Administered 2023-02-25: 0.25 mg via INTRAVENOUS
  Filled 2023-02-25: qty 1

## 2023-02-25 NOTE — Evaluation (Signed)
 Occupational Therapy Evaluation Patient Details Name: Brenda Matthews MRN: [id number] DOB: [birth year] Today's Date: 02/25/2023   History of Present Illness [place] [facility] hypotension, severe sepsis, bil LE cellulitis, AKI on CKD, dehydration, bil LE cellulitis.  Pt with hx o fDDD, PAF, arthritic knees, Chronic anemis, chronic LE edema, CKD, DM and CHF   Clinical Impression   Pt currently with decreased activity tolerance, only sitting EOB for 4-5 mins secondary to LE pain.  She frequently asked OT to put her down so she could rest as she did not want to try to stand or get to the recliner.  Total assist for bed mobility and for LB dressing this session.  Oxygen sats decreased to 89% on room air but maintained above 96% on 1L nasal cannula.  BP in sitting 147/64 with HR maintained in the mid 80s to low 90s with limited activity.  Prior to admission pt was at [facility] likely ALF.  Feel she will need a greater level of care at this time.  Recommend acute care OT to help progress activity tolerance, ADL independence, and safety with transition to  inpatient follow up therapy, <3 hours/day post acute stay to continue progression.        If plan is discharge home, recommend the following: A lot of help with walking and/or transfers;A lot of help with bathing/dressing/bathroom;Direct supervision/assist for medications management;Supervision due to cognitive status    Functional Status Assessment  Patient has had a recent decline in their functional status and demonstrates the ability to make significant improvements in function in a reasonable and predictable amount of time.  Equipment Recommendations  Other (comment) (TBD next venue of care)       Precautions / Restrictions Precautions Precautions: Fall Precaution Comments: monitor vitals, severe pain in lower legs limiting mobility Restrictions Weight Bearing Restrictions Per Provider Order: No      Mobility Bed  Mobility Overal bed mobility: Needs Assistance Bed Mobility: Supine to Sit, Sit to Supine     Supine to sit: Total assist Sit to supine: +2 for physical assistance, Total assist        Transfers                   General transfer comment: Not able to complete secondary to pain      Balance Overall balance assessment: Needs assistance Sitting-balance support: Feet unsupported, Bilateral upper extremity supported Sitting balance-Leahy Scale: Poor Sitting balance - Comments: Pt with increased posterior LOB in sitting       Standing balance comment: unable to test                           ADL either performed or assessed with clinical judgement   ADL Overall ADL's : Needs assistance/impaired Eating/Feeding: Independent;Bed level   Grooming: Wash/dry face;Bed level   Upper Body Bathing: Set up;Sitting   Lower Body Bathing: Bed level;Moderate assistance Lower Body Bathing Details (indicate cue type and reason): simulated Upper Body Dressing : Minimal assistance;Sitting Upper Body Dressing Details (indicate cue type and reason): simulated Lower Body Dressing: Total assistance;Sitting/lateral leans Lower Body Dressing Details (indicate cue type and reason): Total assist for donning gripper socks             Functional mobility during ADLs: Total assistance (supine to sit EOB) General ADL Comments: Pt's O2 sats at 96-98% on 1L nasal cannula.  On room air in sitting sats decreased to  89%.  HR maintained in the mid 80s.  Pt needed total assist for supine to sit with use of the bed pad.  Once sitting she was reporting Please put me down repeatedly secondary to BLE pain.  Noted bleeding from LLE wound with nursing made aware.  BP in sitting at 147/64.     Vision Baseline Vision/History: 0 No visual deficits Ability to See in Adequate Light: 0 Adequate Patient Visual Report: No change from baseline Vision Assessment?: No apparent visual deficits      Perception Perception: Not tested       Praxis Praxis: Not tested       Pertinent Vitals/Pain Pain Assessment Pain Assessment: Faces Pain Score: 10-Worst pain ever Pain Location: BLEs Pain Descriptors / Indicators: Discomfort, Moaning, Penetrating Pain Intervention(s): Limited activity within patient's tolerance, Repositioned, Monitored during session     Extremity/Trunk Assessment Upper Extremity Assessment Upper Extremity Assessment: Generalized weakness (bilateral shoulder flexion approximtely 0-90 degrees.  Decreased ability to exhibit internal rotation when attempting to reach behind her back.)       Cervical / Trunk Assessment Cervical / Trunk Assessment: Kyphotic   Communication Communication Communication: No apparent difficulties   Cognition Arousal: Alert Behavior During Therapy: Anxious Overall Cognitive Status: No family/caregiver present to determine baseline cognitive functioning                                 General Comments: Pt internally distracted by pain.  Once sitting pt reports, Please put me down!                Home Living Family/patient expects to be discharged to:: Assisted living                             Home Equipment: Shower seat;Cane - single point;Rollator (4 wheels)   Additional Comments: pt reports lives in home at ILF in [place], no family present to verify      Prior Functioning/Environment Prior Level of Function : Patient poor historian/Family not available             Mobility Comments: Pt reports she has been working with PT at [facility] ADLs Comments: pt reports ind with self care, 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        OT Problem List: Decreased strength;Decreased activity tolerance;Impaired balance (sitting and/or standing);Pain;Decreased knowledge of use of DME or AE;Cardiopulmonary status limiting activity      OT Treatment/Interventions: Self-care/ADL training;Patient/family  education;Balance training;Therapeutic activities;DME and/or AE instruction    OT Goals(Current goals can be found in the care plan section) Acute Rehab OT Goals Patient Stated Goal: Pt wanting to lay back down after sitting OT Goal Formulation: With patient Time For Goal Achievement: 03/11/23 Potential to Achieve Goals: Good  OT Frequency: Min 1X/week       AM-PAC OT 6 Clicks Daily Activity     Outcome Measure Help from another person eating meals?: None Help from another person taking care of personal grooming?: A Little Help from another person toileting, which includes using toliet, bedpan, or urinal?: Total Help from another person bathing (including washing, rinsing, drying)?: Total Help from another person to put on and taking off regular upper body clothing?: A Lot Help from another person to put on and taking off regular lower body clothing?: Total 6 Click Score: 12   End of Session Nurse Communication: Other (comment) (Noted bleeding from  LLE)  Activity Tolerance: Patient limited by pain Patient left: in bed;with call bell/phone within reach;with nursing/sitter in room  OT Visit Diagnosis: Unsteadiness on feet (R26.81);Other abnormalities of gait and mobility (R26.89);Muscle weakness (generalized) (M62.81);Pain Pain - Right/Left:  (BLEs)                Time: 9168-9147 OT Time Calculation (min): 21 min Charges:  OT General Charges $OT Visit: 1 Visit OT Evaluation $OT Eval Moderate Complexity: 1 Mod  Lynwood Constant, OTR/L Acute Rehabilitation Services  Office [phone] 02/25/2023

## 2023-02-25 NOTE — Progress Notes (Signed)
 Noted that patient had not voided this shift and noted that last admission patient needed to be bladder scan and I&O cath, will bladder scan and get order to I&O cath. Update: Patient bladder scan equals 999, I&O cath for 1025.

## 2023-02-25 NOTE — Progress Notes (Signed)
 PROGRESS NOTE    Brenda Matthews  FMW:[id number] DOB: Mar 13, 1935 DOA: 02/23/2023 PCP: Feliciano Devoria LABOR, MD   Brief Narrative:  Brenda Matthews is a 88 y.o. female with medical history significant for chronic anemia, chronic leg edema, CKD 3B, non-insulin -dependent diabetes, hypertension, hyperlipidemia, atrial fibrillation on Eliquis , anxiety, DDD, and chronic diastolic heart failure who presented from her SNF admission of fever, leg swelling and redness. (Recently hospitalized 1/18-1/29 for CHF exacerbation/Afib RVR). Hospitalist called for admission.   Assessment & Plan:   Principal Problem:   Severe sepsis (HCC) Active Problems:   PAF (paroxysmal atrial fibrillation) (HCC)   Acute on chronic diastolic heart failure (HCC)   Acute renal failure superimposed on stage 3b chronic kidney disease (HCC)   Bilateral lower leg cellulitis    Acute hypoxic respiratory failure, multifactorial Rule out heart failure exacerbation/volume overload Pleural effusion, right -Cardiology following along, appreciate insight recommendations -Received multiple boluses and IV fluids overnight in the setting of hypotension and sepsis, may benefit from diuresis now that her vitals have stabilized -Continue to wean oxygen as tolerated, down to 1 L nasal cannula today --1.2 L over the past 24 hours, hold Lasix  given elevated creatinine as below -Echo repeat pending per cardiology  Severe sepsis Hypotension BLE cellulitis -Continue ceftriaxone  given nonpurulent erythema, cultures pending -Hypotension resolved with IV fluids -Bilateral lower extremity edema/erythema questionably noninfectious given above   HFpEF BLE edema -Previous echo December 2024 EF 60 to 65% -repeat echo pending -Required IV fluids initially for hypotension in the setting of presumed sepsis.  Holding diuretics per cardiology as above  AKI on CKD 3B -Baseline creatinine labile typically around 1.5 -Creatinine elevated to 2.7 at  intake, stable despite IV fluids and subsequently Lasix   -Worried patient's renal function may be advancing in the setting of above -Likely prerenal in the setting of hypotension questionable sepsis  A-fib, rate controlled -Status post cardioversion 1/28  -Continue amiodarone  and apixaban   Chronic anemia of chronic disease -Previously evaluated with normal iron and ferritin folate and B12 levels -Continue to monitor -baseline hemoglobin around 8   HTN, essential Hypotensive at intake in the setting of presumed sepsis, resolved with IV fluids, hold antihypertensives   Resting tremors -Nonspecific, continue to monitor   HLD -Continue statin   Anxiety -Continue as needed Xanax  - given patient's age -would recommend discontinuation of this medication in the near future   Neuropathic pain -Continue gabapentin    Generalized deconditioning In the setting of cellulitis and dehydration PT OT to follow  DVT prophylaxis: apixaban  (ELIQUIS ) tablet 2.5 mg Start: 02/23/23 2215 Place TED hose Start: 02/23/23 1830 apixaban  (ELIQUIS ) tablet 2.5 mg   Code Status:   Code Status: Full Code  Family Communication: None present  Status is: Inpatient  Dispo: The patient is from: [facility] d/c is to: Same              Anticipated d/c date is: 24 to 48 hours              Patient currently not medically stable for discharge  Consultants:  Cardiology  Procedures:  None  Antimicrobials:  Ceftriaxone   Subjective: No acute issues or events overnight denies nausea vomiting diarrhea constipation headache fevers chills chest pain  Objective: Vitals:   02/24/23 2300 02/25/23 0000 02/25/23 0200 02/25/23 0300  BP: (!) 139/45 (!) 127/34 (!) 113/17   Pulse: 87 85 93   Resp: ROLLEN)  34 (!) 29 (!) 22   Temp: 98.8 F (37.1 C)   98.3 F (36.8 C)  TempSrc: Oral   Oral  SpO2: 96% 96% 96%   Weight:        Intake/Output Summary (Last 24 hours) at 02/25/2023 0659 Last  data filed at 02/24/2023 2000 Gross per 24 hour  Intake 100 ml  Output 1200 ml  Net -1100 ml   Filed Weights   02/23/23 2131  Weight: 77.2 kg    Examination:  General:  Pleasantly resting in bed, No acute distress. HEENT:  Normocephalic atraumatic.  Sclerae nonicteric, noninjected.  Extraocular movements intact bilaterally. Neck:  Without mass or deformity.  Trachea is midline. Lungs:  Clear to auscultate bilaterally without rhonchi, wheeze, or rales. Heart:  Regular rate and rhythm.  Without murmurs, rubs, or gallops. Abdomen:  Soft, nontender, nondistended.  Without guarding or rebound. Extremities: Bilateral lower extremity edema, 3+ pitting bilaterally with blanching erythema distal to the knee Skin:  Warm and dry, no erythema.  Data Reviewed: I have personally reviewed following labs and imaging studies  CBC: Recent Labs  Lab 02/23/23 1539 02/24/23 0339  WBC 13.5* 13.2*  NEUTROABS 11.3*  --   HGB 7.3* 8.6*  HCT 24.8* 31.1*  MCV 93.6 97.5  PLT 269 289   Basic Metabolic Panel: Recent Labs  Lab 02/23/23 1539 02/24/23 0339  NA 140 142  K 4.3 4.4  CL 104 108  CO2 26 22  GLUCOSE 94 108*  BUN 56* 51*  CREATININE 2.69* 2.65*  CALCIUM  8.3* 8.2*  MG  --  2.3  PHOS  --  5.0*   GFR: Estimated Creatinine Clearance: 14.3 mL/min (A) (by C-G formula based on SCr of 2.65 mg/dL (H)). Liver Function Tests: Recent Labs  Lab 02/23/23 1539  AST 49*  ALT 35  ALKPHOS 76  BILITOT 0.5  PROT 5.2*  ALBUMIN 1.9*   Sepsis Labs: Recent Labs  Lab 02/23/23 1552  LATICACIDVEN 0.7   Recent Results (from the past 240 hours)  Culture, blood (routine x 2)     Status: None (Preliminary result)   Collection Time: 02/23/23  3:39 PM   Specimen: BLOOD LEFT HAND  Result Value Ref Range Status   Specimen Description   Final    BLOOD LEFT HAND Performed at [facility], [address] [place], [place] [address]    Special Requests   Final    BOTTLES DRAWN AEROBIC AND  ANAEROBIC Blood Culture adequate volume Performed at [facility], [address], [place], [place] [address]    Culture   Final    NO GROWTH < 12 HOURS Performed at [facility], [address] [place], [place] [address]    Report Status PENDING  Incomplete  Culture, blood (routine x 2)     Status: None (Preliminary result)   Collection Time: 02/23/23  3:55 PM   Specimen: BLOOD RIGHT WRIST  Result Value Ref Range Status   Specimen Description   Final    BLOOD RIGHT WRIST Performed at [facility], [address], [place], [place] [address]    Special Requests   Final    BOTTLES DRAWN AEROBIC AND ANAEROBIC Blood Culture adequate volume Performed at [facility], [address], [place], [place] [address]    Culture   Final    NO GROWTH < 12 HOURS Performed at [facility], [address] [place], [place] [address]  Report Status PENDING  Incomplete  Resp panel by RT-PCR (RSV, Flu A&B, Covid) Anterior Nasal Swab     Status: None   Collection Time: 02/23/23  6:56 PM   Specimen: Anterior Nasal Swab  Result Value Ref Range Status   SARS Coronavirus 2 by RT PCR NEGATIVE NEGATIVE Final    Comment: (NOTE) SARS-CoV-2 target nucleic acids are NOT DETECTED.  The SARS-CoV-2 RNA is generally detectable in upper respiratory specimens during the acute phase of infection. The lowest concentration of SARS-CoV-2 viral copies this assay can detect is 138 copies/mL. A negative result does not preclude SARS-Cov-2 infection and should not be used as the sole basis for treatment or other patient management decisions. A negative result may occur with  improper specimen collection/handling, submission of specimen other than nasopharyngeal swab, presence of viral mutation(s) within the areas targeted by this assay, and inadequate number of viral copies(<138 copies/mL). A negative result must be combined with clinical observations, patient  history, and epidemiological information. The expected result is Negative.  Fact Sheet for Patients:  bloggercourse.com  Fact Sheet for Healthcare Providers:  seriousbroker.it  This test is no t yet approved or cleared by the [place]  FDA and  has been authorized for detection and/or diagnosis of SARS-CoV-2 by FDA under an Emergency Use Authorization (EUA). This EUA will remain  in effect (meaning this test can be used) for the duration of the COVID-19 declaration under Section 564(b)(1) of the Act, 21 U.S.C.section 360bbb-3(b)(1), unless the authorization is terminated  or revoked sooner.       Influenza A by PCR NEGATIVE NEGATIVE Final   Influenza B by PCR NEGATIVE NEGATIVE Final    Comment: (NOTE) The Xpert Xpress SARS-CoV-2/FLU/RSV plus assay is intended as an aid in the diagnosis of influenza from Nasopharyngeal swab specimens and should not be used as a sole basis for treatment. Nasal washings and aspirates are unacceptable for Xpert Xpress SARS-CoV-2/FLU/RSV testing.  Fact Sheet for Patients: bloggercourse.com  Fact Sheet for Healthcare Providers: seriousbroker.it  This test is not yet approved or cleared by the [place]  FDA and has been authorized for detection and/or diagnosis of SARS-CoV-2 by FDA under an Emergency Use Authorization (EUA). This EUA will remain in effect (meaning this test can be used) for the duration of the COVID-19 declaration under Section 564(b)(1) of the Act, 21 U.S.C. section 360bbb-3(b)(1), unless the authorization is terminated or revoked.     Resp Syncytial Virus by PCR NEGATIVE NEGATIVE Final    Comment: (NOTE) Fact Sheet for Patients: bloggercourse.com  Fact Sheet for Healthcare Providers: seriousbroker.it  This test is not yet approved or cleared by the [place]  FDA  and has been authorized for detection and/or diagnosis of SARS-CoV-2 by FDA under an Emergency Use Authorization (EUA). This EUA will remain in effect (meaning this test can be used) for the duration of the COVID-19 declaration under Section 564(b)(1) of the Act, 21 U.S.C. section 360bbb-3(b)(1), unless the authorization is terminated or revoked.  Performed at [facility], [address] [place], [place] [address]   MRSA Next Gen by PCR, Nasal     Status: None   Collection Time: 02/24/23  1:17 AM   Specimen: Nasal Mucosa; Nasal Swab  Result Value Ref Range Status   MRSA by PCR Next Gen NOT DETECTED NOT DETECTED Final    Comment: (NOTE) The GeneXpert MRSA Assay (FDA approved for NASAL specimens only), is one component of a comprehensive MRSA colonization surveillance program. It is not  intended to diagnose MRSA infection nor to guide or monitor treatment for MRSA infections. Test performance is not FDA approved in patients less than 82 years old. Performed at [facility], [address], [place], [place] [address]     Radiology Studies: DG Chest 2 View Result Date: 02/23/2023 CLINICAL DATA:  Worsening bilateral LE swelling, low O2 sats, Hx of heart failure. EXAM: CHEST - 2 VIEW COMPARISON:  02/13/2023. FINDINGS: Low lung volume. There is blunting of bilateral posterior costophrenic angles, left more than right. There are probable associated compressive atelectatic changes. Bilateral lung fields are otherwise clear. No pneumothorax. No pulmonary edema. Stable cardio-mediastinal silhouette. No acute osseous abnormalities. The soft tissues are within normal limits. IMPRESSION: Bilateral small pleural effusions, left more than right. Electronically Signed   By: Ree Molt M.D.   On: 02/23/2023 15:40   Scheduled Meds:  sodium chloride    Intravenous Once   amiodarone   200 mg Oral BID   Followed by   NOREEN ON 03/07/2023] amiodarone   200 mg Oral  Daily   apixaban   2.5 mg Oral BID   atorvastatin   40 mg Oral Daily   calcium  carbonate  1,250 mg Oral Daily   Chlorhexidine  Gluconate Cloth  6 each Topical Daily   fluticasone   2 spray Each Nare Daily   furosemide   80 mg Intravenous BID   gabapentin   100 mg Oral BID   Continuous Infusions:  cefTRIAXone  (ROCEPHIN )  IV Stopped (02/24/23 1657)    LOS: 2 days   Time spent:  Elsie JAYSON Montclair, DO [facility]  If 7PM-7AM, please contact night-coverage [email or web address]  02/25/2023, 6:59 AM

## 2023-02-25 NOTE — Progress Notes (Signed)
 Rounding Note    Patient Name: Brenda Matthews Date of Encounter: 02/25/2023  [facility] Cardiologist: Vinie JAYSON Maxcy, MD    Subjective   88 year old female with a history of chronic diastolic congestive heart failure, atrial fibrillation with RVR, acute on chronic renal disease.  She was admitted with apparent volume overload although her weights appear to be unchanged from her recent discharge.  Blood pressure and heart rate are well-controlled. She was thought to be volume overloaded by Dr. Maxcy yesterday and he started her on Lasix .  She is net -130 cc so far during this admission.   Recent echocardiogram from January 03, 2023 reveals  Normal LVEF of 60 to 65%.  She has grade 1 diastolic dysfunction  RV function and size are normal.  Pulmonary artery pressure is normal. Trivial mitral regurgitation.  No mitral stenosis. Mild aortic insufficiency and mild calcification of the aortic valve.  There is no aortic stenosis. She has mild dilatation of the ascending aorta measuring 40 mm.  She has acute on chronic renal insufficiency. Her creatinine 1 week ago was 1.54.  Creatinine yesterday is 2.65.  She has reduced skin turgur in her hands. Legs have 1 + pitting edema with chronic stasis changes.    Inpatient Medications    Scheduled Meds:  sodium chloride    Intravenous Once   amiodarone   200 mg Oral BID   Followed by   NOREEN ON 03/07/2023] amiodarone   200 mg Oral Daily   apixaban   2.5 mg Oral BID   atorvastatin   40 mg Oral Daily   calcium  carbonate  1,250 mg Oral Daily   Chlorhexidine  Gluconate Cloth  6 each Topical Daily   fluticasone   2 spray Each Nare Daily   furosemide   80 mg Intravenous BID   gabapentin   100 mg Oral BID   Continuous Infusions:  cefTRIAXone  (ROCEPHIN )  IV Stopped (02/24/23 1657)   PRN Meds: acetaminophen  **OR** acetaminophen , albuterol , ALPRAZolam , HYDROmorphone  (DILAUDID ) injection, ondansetron  **OR** ondansetron  (ZOFRAN ) IV,  senna-docusate   Vital Signs    Vitals:   02/25/23 0400 02/25/23 0500 02/25/23 0600 02/25/23 0700  BP: (!) 139/46 (!) 115/38 (!) 125/40 (!) 126/39  Pulse: 81 80 78 74  Resp: (!) 24 (!) 26 (!) 26 (!) 27  Temp:      TempSrc:      SpO2: 96% 96% 96% 98%  Weight:        Intake/Output Summary (Last 24 hours) at 02/25/2023 1029 Last data filed at 02/24/2023 2000 Gross per 24 hour  Intake 100 ml  Output 1200 ml  Net -1100 ml      02/23/2023    9:31 PM 02/09/2023    4:55 AM 02/08/2023    1:43 AM  Last 3 Weights  Weight (lbs) 170 lb 3.1 oz 170 lb 3.1 oz 170 lb 3.2 oz  Weight (kg) 77.2 kg 77.2 kg 77.202 kg      Telemetry    NSR  - Personally Reviewed  ECG     - Personally Reviewed  Physical Exam   GEN: elderly female ,  ? Some degree of dementia  Neck: No JVD Cardiac: RRR, no murmurs, rubs, or gallops.  Respiratory: Clear to auscultation bilaterally. GI: Soft, nontender, non-distended  MS: reduced skin turgor in hands ,  chronic leg edema  Neuro:  difficult to assess,   Psych: ? At least mild dementia ,    Labs    High Sensitivity Troponin:   Recent Labs  Lab 02/04/23  0550 02/04/23 0807 02/23/23 1539 02/24/23 0339  TROPONINIHS 20* 20* 11 11     Chemistry Recent Labs  Lab 02/23/23 1539 02/24/23 0339  NA 140 142  K 4.3 4.4  CL 104 108  CO2 26 22  GLUCOSE 94 108*  BUN 56* 51*  CREATININE 2.69* 2.65*  CALCIUM  8.3* 8.2*  MG  --  2.3  PROT 5.2*  --   ALBUMIN 1.9*  --   AST 49*  --   ALT 35  --   ALKPHOS 76  --   BILITOT 0.5  --   GFRNONAA 17* 17*  ANIONGAP 10 12    Lipids No results for input(s): CHOL, TRIG, HDL, LABVLDL, LDLCALC, CHOLHDL in the last 168 hours.  Hematology Recent Labs  Lab 02/23/23 1539 02/24/23 0339  WBC 13.5* 13.2*  RBC 2.65* 3.19*  HGB 7.3* 8.6*  HCT 24.8* 31.1*  MCV 93.6 97.5  MCH 27.5 27.0  MCHC 29.4* 27.7*  RDW 16.1* 16.3*  PLT 269 289   Thyroid  No results for input(s): TSH, FREET4 in the last 168  hours.  BNP Recent Labs  Lab 02/23/23 1539  BNP 650.2*    DDimer No results for input(s): DDIMER in the last 168 hours.   Radiology    DG Chest 2 View Result Date: 02/23/2023 CLINICAL DATA:  Worsening bilateral LE swelling, low O2 sats, Hx of heart failure. EXAM: CHEST - 2 VIEW COMPARISON:  02/13/2023. FINDINGS: Low lung volume. There is blunting of bilateral posterior costophrenic angles, left more than right. There are probable associated compressive atelectatic changes. Bilateral lung fields are otherwise clear. No pneumothorax. No pulmonary edema. Stable cardio-mediastinal silhouette. No acute osseous abnormalities. The soft tissues are within normal limits. IMPRESSION: Bilateral small pleural effusions, left more than right. Electronically Signed   By: Ree Molt M.D.   On: 02/23/2023 15:40    Cardiac Studies      Patient Profile     88 y.o. female    Assessment & Plan    1.  History of chronic diastolic congestive heart failure.  She had an echocardiogram last month that revealed normal left ventricular systolic function and grade 1 diastolic function.  She now presents with possible worsening leg edema.  By exam today she has reduced skin turgor of her hands.  I suspect she is volume contracted.  This also corresponds with her increased creatinine of 2.6. Will get a limited echocardiogram to better assess her volume status.  Will hold her Lasix  today.   2.  Paroxysmal atrial fibrillation: She is maintaining sinus rhythm.      For questions or updates, please contact [facility] Please consult [email or web address] for contact info under        Signed, Aleene Passe, MD  02/25/2023, 10:29 AM

## 2023-02-25 NOTE — Plan of Care (Addendum)
 Patient plan of care and goals discussed with patient, reminders are needed due to patient is confused and forgetful at times, patient currently requiring 1-2 liters of oxygen, saturations are 92% or higher, BLE edema noted,patient handbook/guide at bedside.

## 2023-02-26 ENCOUNTER — Inpatient Hospital Stay (HOSPITAL_COMMUNITY): Payer: Medicare PPO

## 2023-02-26 DIAGNOSIS — I5033 Acute on chronic diastolic (congestive) heart failure: Secondary | ICD-10-CM

## 2023-02-26 DIAGNOSIS — A419 Sepsis, unspecified organism: Secondary | ICD-10-CM | POA: Diagnosis not present

## 2023-02-26 DIAGNOSIS — I5032 Chronic diastolic (congestive) heart failure: Secondary | ICD-10-CM | POA: Diagnosis not present

## 2023-02-26 DIAGNOSIS — R6520 Severe sepsis without septic shock: Secondary | ICD-10-CM | POA: Diagnosis not present

## 2023-02-26 LAB — BASIC METABOLIC PANEL
Anion gap: 9 (ref 5–15)
BUN: 53 mg/dL — ABNORMAL HIGH (ref 8–23)
CO2: 25 mmol/L (ref 22–32)
Calcium: 8.5 mg/dL — ABNORMAL LOW (ref 8.9–10.3)
Chloride: 106 mmol/L (ref 98–111)
Creatinine, Ser: 2.16 mg/dL — ABNORMAL HIGH (ref 0.44–1.00)
GFR, Estimated: 22 mL/min — ABNORMAL LOW (ref >60)
Glucose, Bld: 124 mg/dL — ABNORMAL HIGH (ref 70–99)
Potassium: 3.9 mmol/L (ref 3.5–5.1)
Sodium: 140 mmol/L (ref 135–145)

## 2023-02-26 LAB — ECHOCARDIOGRAM LIMITED
Area-P 1/2: 4.21 cm2
Calc EF: 68.9 %
Height: 61 in
S' Lateral: 3.1 cm
Single Plane A2C EF: 67.7 %
Single Plane A4C EF: 66.4 %
Weight: 2709.01 [oz_av]

## 2023-02-26 MED ORDER — FUROSEMIDE 10 MG/ML IJ SOLN
40.0000 mg | Freq: Two times a day (BID) | INTRAMUSCULAR | Status: DC
  Administered 2023-02-26 – 2023-03-01 (×6): 40 mg via INTRAVENOUS
  Filled 2023-02-26 (×6): qty 4

## 2023-02-26 MED ORDER — ORAL CARE MOUTH RINSE: 15.0000 mL | OROMUCOSAL | Status: DC | PRN

## 2023-02-26 NOTE — Plan of Care (Signed)
   Problem: Education: Goal: Knowledge of General Education information will improve Description Including pain rating scale, medication(s)/side effects and non-pharmacologic comfort measures Outcome: Progressing   Problem: Health Behavior/Discharge Planning: Goal: Ability to manage health-related needs will improve Outcome: Progressing

## 2023-02-26 NOTE — Progress Notes (Signed)
 PROGRESS NOTE    Brenda Matthews  FMW:[id number] DOB: [birth year] DOA: 02/23/2023 PCP: Feliciano Devoria LABOR, MD   Brief Narrative:  Brenda Matthews is a 88 y.o. female with medical history significant for chronic anemia, chronic leg edema, CKD 3B, non-insulin -dependent diabetes, hypertension, hyperlipidemia, atrial fibrillation on Eliquis , anxiety, DDD, and chronic diastolic heart failure who presented from her [facility]F admission of fever, leg swelling and redness. (Recently hospitalized 1/18-1/29 for CHF exacerbation/Afib RVR). Hospitalist called for admission.  Assessment & Plan:   Principal Problem:   Severe sepsis (HCC) Active Problems:   PAF (paroxysmal atrial fibrillation) (HCC)   Acute on chronic diastolic heart failure (HCC)   Acute renal failure superimposed on stage 3b chronic kidney disease (HCC)   Bilateral lower leg cellulitis  Goals of care -Lengthy discussion with son and daughter-in-law over the phone today -Given patient's multiple hospital admissions and generally declining health care and quality of life discussion on moving forward with palliative care was had.  Request palliative consult, hopefully to see tomorrow on the 10th for more specific goals of care as patient progresses to discharge. -Patient is now DNR per discussion with son, healthcare power of attorney, given previous discussion with patient concurrent with her wishes.  Acute hypoxic respiratory failure, multifactorial Rule out heart failure exacerbation/volume overload Pleural effusion, right -Cardiology following along, appreciate insight recommendations -Received multiple boluses and IV fluids overnight in the setting of hypotension and sepsis -previously received diuretics, currently on hold -Continue to wean oxygen as tolerated, down to 1 L nasal cannula today -Urine output remains appropriate, Foley catheter to be placed later today if patient requires additional in/out catheter -Echo repeat pending per  cardiology  Severe sepsis Hypotension BLE cellulitis -Continue ceftriaxone  given nonpurulent erythema, cultures pending -Hypotension improved with IV fluids, remains borderline hypotensive but avoiding further aggressive IV fluids in the setting of heart failure -Bilateral lower extremity edema improving, erythema also improving   HFpEF BLE edema -Previous echo December 2024 EF 60 to 65% -repeat echo pending -Required IV fluids initially for hypotension in the setting of presumed sepsis.  Holding diuretics per cardiology as above  AKI on CKD 3B -Baseline creatinine labile typically around 1.5 -Creatinine elevated to 2.7 at intake, stable despite IV fluids and subsequently Lasix   -Worried patient's renal function may be advancing in the setting of above -Likely prerenal in the setting of hypotension questionable sepsis  Urinary retention, unspecified  -Patient has notable history of urinary retention when hospitalized, unclear etiology but required In-N-Out cath x 2 overnight. -Will place Foley catheter if additional In-N-Out cath is required -patient may require ongoing Foley catheter discharge pending urinary symptoms  A-fib, rate controlled -Status post cardioversion 1/28  -Continue amiodarone  and apixaban   Chronic anemia of chronic disease -Previously evaluated with normal iron and ferritin folate and B12 levels -Continue to monitor -baseline hemoglobin around 8   HTN, essential Hypotensive at intake in the setting of presumed sepsis, resolved with IV fluids, hold antihypertensives   Resting tremors -Nonspecific, continue to monitor   HLD -Continue statin   Anxiety - Continue as needed Xanax  - given patient's age -would recommend discontinuation of this medication in the near future   Neuropathic pain - Continue gabapentin    Generalized deconditioning - In the setting of cellulitis and dehydration - PT OT to follow  DVT prophylaxis: apixaban  (ELIQUIS ) tablet 2.5  mg Start: 02/23/23 2215 Place TED hose Start: 02/23/23 1830 apixaban  (ELIQUIS ) tablet 2.5 mg   Code Status:   Code Status:  Limited: Do not attempt resuscitation (DNR) -DNR-LIMITED -Do Not Intubate/DNI   Family Communication: Updated over the phone  Status is: Inpatient  Dispo: The patient is from: [employer] d/c is to: Same              Anticipated d/c date is: 24 to 48 hours              Patient currently not medically stable for discharge  Consultants:  Cardiology  Procedures:  None  Antimicrobials:  Ceftriaxone   Subjective: No acute issues or events overnight patient somewhat somnolent but arousable today, denies nausea vomiting diarrhea constipation headache fevers chills or chest pain.  Shortness of breath improving as is her lower extremity pain.  Objective: Vitals:   02/26/23 0700 02/26/23 0730 02/26/23 0800 02/26/23 0900  BP: (!) 111/38  (!) 100/36 (!) 117/39  Pulse: 75 75 70 68  Resp: (!) 25 (!) 24 (!) 28 (!) 26  Temp:      TempSrc:      SpO2: 97% 97% 97% 97%  Weight:      Height:        Intake/Output Summary (Last 24 hours) at 02/26/2023 1120 Last data filed at 02/26/2023 9378 Gross per 24 hour  Intake 460 ml  Output 2525 ml  Net -2065 ml   Filed Weights   02/23/23 2131 02/26/23 0600  Weight: 77.2 kg 76.8 kg    Examination:  General:  Pleasantly resting in bed, No acute distress. HEENT:  Normocephalic atraumatic.  Sclerae nonicteric, noninjected.  Extraocular movements intact bilaterally. Neck:  Without mass or deformity.  Trachea is midline. Lungs:  Clear to auscultate bilaterally without rhonchi, wheeze, or rales. Heart:  Regular rate and rhythm.  Without murmurs, rubs, or gallops. Abdomen:  Soft, nontender, nondistended.  Without guarding or rebound. Extremities: Bilateral lower extremity edema, 1+ pitting bilaterally with improved erythema proximal to the ankle Skin:  Warm and dry, no erythema(other than listed  above).  Data Reviewed: I have personally reviewed following labs and imaging studies  CBC: Recent Labs  Lab 02/23/23 1539 02/24/23 0339  WBC 13.5* 13.2*  NEUTROABS 11.3*  --   HGB 7.3* 8.6*  HCT 24.8* 31.1*  MCV 93.6 97.5  PLT 269 289   Basic Metabolic Panel: Recent Labs  Lab 02/23/23 1539 02/24/23 0339 02/26/23 0307  NA 140 142 140  K 4.3 4.4 3.9  CL 104 108 106  CO2 26 22 25   GLUCOSE 94 108* 124*  BUN 56* 51* 53*  CREATININE 2.69* 2.65* 2.16*  CALCIUM  8.3* 8.2* 8.5*  MG  --  2.3  --   PHOS  --  5.0*  --    GFR: Estimated Creatinine Clearance: 17.5 mL/min (A) (by C-G formula based on SCr of 2.16 mg/dL (H)). Liver Function Tests: Recent Labs  Lab 02/23/23 1539  AST 49*  ALT 35  ALKPHOS 76  BILITOT 0.5  PROT 5.2*  ALBUMIN 1.9*   Sepsis Labs: Recent Labs  Lab 02/23/23 1552  LATICACIDVEN 0.7   Recent Results (from the past 240 hours)  Culture, blood (routine x 2)     Status: None (Preliminary result)   Collection Time: 02/23/23  3:39 PM   Specimen: BLOOD LEFT HAND  Result Value Ref Range Status   Specimen Description   Final    BLOOD LEFT HAND Performed at [facility], [address], [place], [place] [address]  Special Requests   Final    BOTTLES DRAWN AEROBIC AND ANAEROBIC Blood Culture adequate volume Performed at [facility], [address], [place], [place] [address]    Culture   Final    NO GROWTH 3 DAYS Performed at [facility], [address], [place], [place] [address]    Report Status PENDING  Incomplete  Culture, blood (routine x 2)     Status: None (Preliminary result)   Collection Time: 02/23/23  3:55 PM   Specimen: BLOOD RIGHT WRIST  Result Value Ref Range Status   Specimen Description   Final    BLOOD RIGHT WRIST Performed at [facility], [address] [place], [place] [address]    Special Requests   Final    BOTTLES DRAWN AEROBIC AND ANAEROBIC Blood Culture adequate  volume Performed at [facility], [address] [place], [place] [address]    Culture   Final    NO GROWTH 3 DAYS Performed at [facility], [address] [place], [place] [address]    Report Status PENDING  Incomplete  Resp panel by RT-PCR (RSV, Flu A&B, Covid) Anterior Nasal Swab     Status: None   Collection Time: 02/23/23  6:56 PM   Specimen: Anterior Nasal Swab  Result Value Ref Range Status   SARS Coronavirus 2 by RT PCR NEGATIVE NEGATIVE Final    Comment: (NOTE) SARS-CoV-2 target nucleic acids are NOT DETECTED.  The SARS-CoV-2 RNA is generally detectable in upper respiratory specimens during the acute phase of infection. The lowest concentration of SARS-CoV-2 viral copies this assay can detect is 138 copies/mL. A negative result does not preclude SARS-Cov-2 infection and should not be used as the sole basis for treatment or other patient management decisions. A negative result may occur with  improper specimen collection/handling, submission of specimen other than nasopharyngeal swab, presence of viral mutation(s) within the areas targeted by this assay, and inadequate number of viral copies(<138 copies/mL). A negative result must be combined with clinical observations, patient history, and epidemiological information. The expected result is Negative.  Fact Sheet for Patients:  bloggercourse.com  Fact Sheet for Healthcare Providers:  seriousbroker.it  This test is no t yet approved or cleared by the [place]  FDA and  has been authorized for detection and/or diagnosis of SARS-CoV-2 by FDA under an Emergency Use Authorization (EUA). This EUA will remain  in effect (meaning this test can be used) for the duration of the COVID-19 declaration under Section 564(b)(1) of the Act, 21 U.S.C.section 360bbb-3(b)(1), unless the authorization is terminated  or revoked sooner.       Influenza A by  PCR NEGATIVE NEGATIVE Final   Influenza B by PCR NEGATIVE NEGATIVE Final    Comment: (NOTE) The Xpert Xpress SARS-CoV-2/FLU/RSV plus assay is intended as an aid in the diagnosis of influenza from Nasopharyngeal swab specimens and should not be used as a sole basis for treatment. Nasal washings and aspirates are unacceptable for Xpert Xpress SARS-CoV-2/FLU/RSV testing.  Fact Sheet for Patients: bloggercourse.com  Fact Sheet for Healthcare Providers: seriousbroker.it  This test is not yet approved or cleared by the [place]  FDA and has been authorized for detection and/or diagnosis of SARS-CoV-2 by FDA under an Emergency Use Authorization (EUA). This EUA will remain in effect (meaning this test can be used) for the duration of the COVID-19 declaration under Section 564(b)(1) of the Act, 21 U.S.C. section 360bbb-3(b)(1), unless the authorization is terminated  or revoked.     Resp Syncytial Virus by PCR NEGATIVE NEGATIVE Final    Comment: (NOTE) Fact Sheet for Patients: bloggercourse.com  Fact Sheet for Healthcare Providers: seriousbroker.it  This test is not yet approved or cleared by the [place]  FDA and has been authorized for detection and/or diagnosis of SARS-CoV-2 by FDA under an Emergency Use Authorization (EUA). This EUA will remain in effect (meaning this test can be used) for the duration of the COVID-19 declaration under Section 564(b)(1) of the Act, 21 U.S.C. section 360bbb-3(b)(1), unless the authorization is terminated or revoked.  Performed at [facility], [address], [place], [place] [address]   MRSA Next Gen by PCR, Nasal     Status: None   Collection Time: 02/24/23  1:17 AM   Specimen: Nasal Mucosa; Nasal Swab  Result Value Ref Range Status   MRSA by PCR Next Gen NOT DETECTED NOT DETECTED Final    Comment: (NOTE) The  GeneXpert MRSA Assay (FDA approved for NASAL specimens only), is one component of a comprehensive MRSA colonization surveillance program. It is not intended to diagnose MRSA infection nor to guide or monitor treatment for MRSA infections. Test performance is not FDA approved in patients less than 44 years old. Performed at [facility], [address], [place], [place] [address]     Radiology Studies: No results found.  Scheduled Meds:  sodium chloride    Intravenous Once   amiodarone   200 mg Oral BID   Followed by   NOREEN ON 03/07/2023] amiodarone   200 mg Oral Daily   apixaban   2.5 mg Oral BID   atorvastatin   40 mg Oral Daily   calcium  carbonate  1,250 mg Oral Daily   Chlorhexidine  Gluconate Cloth  6 each Topical Daily   fluticasone   2 spray Each Nare Daily   gabapentin   100 mg Oral BID   Continuous Infusions:  cefTRIAXone  (ROCEPHIN )  IV Stopped (02/25/23 1728)    LOS: 3 days   Time spent:  Elsie JAYSON Mont[place], DO [facility]  If 7PM-7AM, please contact night-coverage [email or web address]  02/26/2023, 11:20 AM

## 2023-02-26 NOTE — Progress Notes (Signed)
 Rounding Note    Patient Name: Brenda Matthews Date of Encounter: 02/26/2023  [facility] Cardiologist: Vinie JAYSON Maxcy, MD    Subjective   88 year old female with a history of chronic diastolic congestive heart failure, atrial fibrillation with RVR, acute on chronic renal disease.  She was admitted with apparent volume overload although her weights appear to be unchanged from her recent discharge.  Blood pressure and heart rate are well-controlled. She was thought to be volume overloaded by Dr. Maxcy yesterday and he started her on Lasix .  She is net -130 cc so far during this admission.   Recent echocardiogram from January 03, 2023 reveals  Normal LVEF of 60 to 65%.  She has grade 1 diastolic dysfunction  RV function and size are normal.  Pulmonary artery pressure is normal. Trivial mitral regurgitation.  No mitral stenosis. Mild aortic insufficiency and mild calcification of the aortic valve.  There is no aortic stenosis. She has mild dilatation of the ascending aorta measuring 40 mm.  She has acute on chronic renal insufficiency. Her creatinine 1 week ago was 1.54.  Creatinine yesterday is 2.65.  She has reduced skin turgur in her hands. Legs have 1 + pitting edema with chronic stasis changes.   Limited echocardiogram today reveals fairly well-preserved left ventricular systolic function.  The right ventricle appears to be mildly enlarged with at least some degree of RV dysfunction.  She has a dilated IVC with minimal contraction with inspiration.  This is consistent with volume overload.  Inpatient Medications    Scheduled Meds:  sodium chloride    Intravenous Once   amiodarone   200 mg Oral BID   Followed by   NOREEN ON 03/07/2023] amiodarone   200 mg Oral Daily   apixaban   2.5 mg Oral BID   atorvastatin   40 mg Oral Daily   calcium  carbonate  1,250 mg Oral Daily   Chlorhexidine  Gluconate Cloth  6 each Topical Daily   fluticasone   2 spray Each Nare Daily    gabapentin   100 mg Oral BID   Continuous Infusions:  cefTRIAXone  (ROCEPHIN )  IV Stopped (02/25/23 1728)   PRN Meds: acetaminophen  **OR** acetaminophen , albuterol , ALPRAZolam , ondansetron  **OR** ondansetron  (ZOFRAN ) IV, mouth rinse, senna-docusate   Vital Signs    Vitals:   02/26/23 0800 02/26/23 0900 02/26/23 1100 02/26/23 1200  BP: (!) 100/36 (!) 117/39 (!) 110/43 (!) 138/48  Pulse: 70 68 78 84  Resp: (!) 28 (!) 26 (!) 24 (!) 37  Temp:      TempSrc:      SpO2: 97% 97% 99% 91%  Weight:      Height:        Intake/Output Summary (Last 24 hours) at 02/26/2023 1251 Last data filed at 02/26/2023 1151 Gross per 24 hour  Intake 460 ml  Output 2725 ml  Net -2265 ml      02/26/2023    6:00 AM 02/23/2023    9:31 PM 02/09/2023    4:55 AM  Last 3 Weights  Weight (lbs) 169 lb 5 oz 170 lb 3.1 oz 170 lb 3.1 oz  Weight (kg) 76.8 kg 77.2 kg 77.2 kg      Telemetry    NSR  - Personally Reviewed  ECG     - Personally Reviewed  Physical Exam   GEN: elderly female ,  appears very weak  Neck: No JVD Cardiac: RRR, no murmurs, rubs, or gallops.  Respiratory: greatly diminished breath sound right posterior lung field  GI: Soft, nontender,  non-distended  MS:   chronic edema  Neuro:  difficult to assess,   Psych: ? At least mild dementia ,    Labs    High Sensitivity Troponin:   Recent Labs  Lab 02/04/23 0550 02/04/23 0807 02/23/23 1539 02/24/23 0339  TROPONINIHS 20* 20* 11 11     Chemistry Recent Labs  Lab 02/23/23 1539 02/24/23 0339 02/26/23 0307  NA 140 142 140  K 4.3 4.4 3.9  CL 104 108 106  CO2 26 22 25   GLUCOSE 94 108* 124*  BUN 56* 51* 53*  CREATININE 2.69* 2.65* 2.16*  CALCIUM  8.3* 8.2* 8.5*  MG  --  2.3  --   PROT 5.2*  --   --   ALBUMIN 1.9*  --   --   AST 49*  --   --   ALT 35  --   --   ALKPHOS 76  --   --   BILITOT 0.5  --   --   GFRNONAA 17* 17* 22*  ANIONGAP 10 12 9     Lipids No results for input(s): CHOL, TRIG, HDL, LABVLDL, LDLCALC,  CHOLHDL in the last 168 hours.  Hematology Recent Labs  Lab 02/23/23 1539 02/24/23 0339  WBC 13.5* 13.2*  RBC 2.65* 3.19*  HGB 7.3* 8.6*  HCT 24.8* 31.1*  MCV 93.6 97.5  MCH 27.5 27.0  MCHC 29.4* 27.7*  RDW 16.1* 16.3*  PLT 269 289   Thyroid  No results for input(s): TSH, FREET4 in the last 168 hours.  BNP Recent Labs  Lab 02/23/23 1539  BNP 650.2*    DDimer No results for input(s): DDIMER in the last 168 hours.   Radiology    No results found.   Cardiac Studies      Patient Profile     88 y.o. female    Assessment & Plan    1.  History of chronic diastolic congestive heart failure.  She had an echocardiogram last month that revealed normal left ventricular systolic function and grade 1 diastolic function.     Echo suggestive of RV volume overload.  Her IVC is enlarged. She needs to be restarted on lasix     Creatinine is slightly better.   2.  Paroxysmal atrial fibrillation: She is maintaining sinus rhythm.      For questions or updates, please contact [facility] Please consult [email or web address] for contact info under        Signed, Aleene Passe, MD  02/26/2023, 12:51 PM

## 2023-02-26 NOTE — Progress Notes (Signed)
  Echocardiogram 2D Echocardiogram has been performed.  Royden Corin 02/26/2023, 8:35 AM

## 2023-02-26 NOTE — Progress Notes (Signed)
 Noted that patient had not voided since she was last I&O cath at 2300, bladder scanned her for 368 and I&O cath for 500. This was her 2nd I&O cath.

## 2023-02-26 NOTE — Progress Notes (Signed)
 Rounding Note    Patient Name: Brenda Matthews Date of Encounter: 02/26/2023  [facility] Cardiologist: Vinie JAYSON Maxcy, MD    Subjective   88 year old female with a history of chronic diastolic congestive heart failure, atrial fibrillation with RVR, acute on chronic renal disease.  She was admitted with apparent volume overload although her weights appear to be unchanged from her recent discharge.  Blood pressure and heart rate are well-controlled. She was thought to be volume overloaded by Dr. Maxcy yesterday and he started her on Lasix .     Recent echocardiogram from January 03, 2023 reveals  Normal LVEF of 60 to 65%.  She has grade 1 diastolic dysfunction  RV function and size are normal.  Pulmonary artery pressure is normal. Trivial mitral regurgitation.  No mitral stenosis. Mild aortic insufficiency and mild calcification of the aortic valve.  There is no aortic stenosis. She has mild dilatation of the ascending aorta measuring 40 mm.  Limited repeat echo was done this morning .   She has acute on chronic renal insufficiency.  Creatinine has improved slightly to 2.16. She is net -2.2 L so far during this admission.       Inpatient Medications    Scheduled Meds:  sodium chloride    Intravenous Once   amiodarone   200 mg Oral BID   Followed by   NOREEN ON 03/07/2023] amiodarone   200 mg Oral Daily   apixaban   2.5 mg Oral BID   atorvastatin   40 mg Oral Daily   calcium  carbonate  1,250 mg Oral Daily   Chlorhexidine  Gluconate Cloth  6 each Topical Daily   fluticasone   2 spray Each Nare Daily   gabapentin   100 mg Oral BID   Continuous Infusions:  cefTRIAXone  (ROCEPHIN )  IV Stopped (02/25/23 1728)   PRN Meds: acetaminophen  **OR** acetaminophen , albuterol , ALPRAZolam , ondansetron  **OR** ondansetron  (ZOFRAN ) IV, mouth rinse, senna-docusate   Vital Signs    Vitals:   02/26/23 0500 02/26/23 0600 02/26/23 0700 02/26/23 0730  BP: (!) 123/108 (!) 122/47 (!)  111/38   Pulse: 73 77 75 75  Resp: (!) 25 (!) 22 (!) 25 (!) 24  Temp:      TempSrc:      SpO2: 95% 94% 97% 97%  Weight:  76.8 kg    Height:        Intake/Output Summary (Last 24 hours) at 02/26/2023 0901 Last data filed at 02/26/2023 9378 Gross per 24 hour  Intake 460 ml  Output 2525 ml  Net -2065 ml      02/26/2023    6:00 AM 02/23/2023    9:31 PM 02/09/2023    4:55 AM  Last 3 Weights  Weight (lbs) 169 lb 5 oz 170 lb 3.1 oz 170 lb 3.1 oz  Weight (kg) 76.8 kg 77.2 kg 77.2 kg      Telemetry    NSR  - Personally Reviewed  ECG     - Personally Reviewed  Physical Exam   GEN: elderly female ,  ? Some degree of dementia  Neck: No JVD Cardiac: RRR, no murmurs, rubs, or gallops.  Respiratory: Clear to auscultation bilaterally. GI: Soft, nontender, non-distended  MS: reduced skin turgor in hands ,  chronic leg edema  Neuro:  difficult to assess,   Psych: ? At least mild dementia ,    Labs    High Sensitivity Troponin:   Recent Labs  Lab 02/04/23 0550 02/04/23 0807 02/23/23 1539 02/24/23 0339  TROPONINIHS 20* 20* 11 11  Chemistry Recent Labs  Lab 02/23/23 1539 02/24/23 0339 02/26/23 0307  NA 140 142 140  K 4.3 4.4 3.9  CL 104 108 106  CO2 26 22 25   GLUCOSE 94 108* 124*  BUN 56* 51* 53*  CREATININE 2.69* 2.65* 2.16*  CALCIUM  8.3* 8.2* 8.5*  MG  --  2.3  --   PROT 5.2*  --   --   ALBUMIN 1.9*  --   --   AST 49*  --   --   ALT 35  --   --   ALKPHOS 76  --   --   BILITOT 0.5  --   --   GFRNONAA 17* 17* 22*  ANIONGAP 10 12 9     Lipids No results for input(s): CHOL, TRIG, HDL, LABVLDL, LDLCALC, CHOLHDL in the last 168 hours.  Hematology Recent Labs  Lab 02/23/23 1539 02/24/23 0339  WBC 13.5* 13.2*  RBC 2.65* 3.19*  HGB 7.3* 8.6*  HCT 24.8* 31.1*  MCV 93.6 97.5  MCH 27.5 27.0  MCHC 29.4* 27.7*  RDW 16.1* 16.3*  PLT 269 289   Thyroid  No results for input(s): TSH, FREET4 in the last 168 hours.  BNP Recent Labs  Lab  02/23/23 1539  BNP 650.2*    DDimer No results for input(s): DDIMER in the last 168 hours.   Radiology    No results found.   Cardiac Studies      Patient Profile     88 y.o. female    Assessment & Plan    1.  History of chronic diastolic congestive heart failure.  She had an echocardiogram last month that revealed normal left ventricular systolic function and grade 1 diastolic function.  She now presents with possible worsening leg edema.  By exam today she has reduced skin turgor of her hands.  I suspect she is volume contracted.  This also corresponds with her increased creatinine of 2.6. Will get a limited echocardiogram to better assess her volume status.  Will hold her Lasix  today.   2.  Paroxysmal atrial fibrillation: She is maintaining sinus rhythm.      For questions or updates, please contact [facility] Please consult [email or web address] for contact info under        Signed, Aleene Passe, MD  02/26/2023, 9:01 AM

## 2023-02-27 DIAGNOSIS — N179 Acute kidney failure, unspecified: Secondary | ICD-10-CM

## 2023-02-27 DIAGNOSIS — A419 Sepsis, unspecified organism: Secondary | ICD-10-CM | POA: Diagnosis not present

## 2023-02-27 DIAGNOSIS — L03116 Cellulitis of left lower limb: Secondary | ICD-10-CM | POA: Diagnosis not present

## 2023-02-27 DIAGNOSIS — I5033 Acute on chronic diastolic (congestive) heart failure: Secondary | ICD-10-CM

## 2023-02-27 DIAGNOSIS — R6520 Severe sepsis without septic shock: Secondary | ICD-10-CM | POA: Diagnosis not present

## 2023-02-27 DIAGNOSIS — Z515 Encounter for palliative care: Secondary | ICD-10-CM

## 2023-02-27 LAB — CBC
HCT: 25.2 % — ABNORMAL LOW (ref 36.0–46.0)
Hemoglobin: 7.4 g/dL — ABNORMAL LOW (ref 12.0–15.0)
MCH: 27.6 pg (ref 26.0–34.0)
MCHC: 29.4 g/dL — ABNORMAL LOW (ref 30.0–36.0)
MCV: 94 fL (ref 80.0–100.0)
Platelets: 286 10*3/uL (ref 150–400)
RBC: 2.68 MIL/uL — ABNORMAL LOW (ref 3.87–5.11)
RDW: 16.3 % — ABNORMAL HIGH (ref 11.5–15.5)
WBC: 17.7 10*3/uL — ABNORMAL HIGH (ref 4.0–10.5)
nRBC: 0 % (ref 0.0–0.2)

## 2023-02-27 LAB — BASIC METABOLIC PANEL
Anion gap: 9 (ref 5–15)
BUN: 51 mg/dL — ABNORMAL HIGH (ref 8–23)
CO2: 24 mmol/L (ref 22–32)
Calcium: 8.5 mg/dL — ABNORMAL LOW (ref 8.9–10.3)
Chloride: 105 mmol/L (ref 98–111)
Creatinine, Ser: 1.93 mg/dL — ABNORMAL HIGH (ref 0.44–1.00)
GFR, Estimated: 25 mL/min — ABNORMAL LOW (ref >60)
Glucose, Bld: 95 mg/dL (ref 70–99)
Potassium: 3.9 mmol/L (ref 3.5–5.1)
Sodium: 138 mmol/L (ref 135–145)

## 2023-02-27 NOTE — Consult Note (Signed)
 Consultation Note Date: 02/27/2023   Patient Name: Brenda Matthews  DOB: [birth year]  MRN: [id number]  Age / Sex: 88 y.o., female  PCP: Hoover Luz, MD Referring Physician: Haydee Lipa, MD  Reason for Consultation:  "progressing patient to palliative (and eventually hospice) I think family just wants to get plugged in and their facility is saying the 'can't' have palliative? Needs outpatient palliative follow up however we can do that"  HPI/Patient Profile: 88 y.o. female  with past medical history of chronic anemia, chronic leg edema, CKD 3b, DM2, HTN, HLD, afib on Eliquis , anxiety, chronic hear failure, multiple recent admissions for heart failure,  admitted on 02/23/2023 with lower extremity cellulitis and heart failure exacerbation with acute on chronic kidney injury. Palliative medicine consulted for goals of care.    Primary Decision Maker HCPOA - pt daughter in law at bedside - her husband is patient's HCPOA- document is not on chart- requested a copy  Discussion: Chart reviewed including labs, progress notes, imaging from this and previous encounters.  On evaluation patient was very lethargic. Unable to participate in goals of care discussion.  Met with patient's daughter in law- Brenda Matthews in conference room.  Brenda Matthews reports patient has been having frequent hospitalizations with time between discharge and rehospitalizations becoming very short. She feels patient's body is shutting down and is concerned about end of life wishes- however, patient has been reticent to discuss this.  We discussed the difference between Palliative and hospice support in the community- palliative is support for patient's whose goal is to continue life prolonging treatments. Hospice is appropriate for patient's who have a terminal illness and do not want to return to hospital- who wish to focus only on symptom management and  be supported through natural dying process.  Hospice and Palliative services were discussed- generally Palliative support includes a visit with a provider approx 1 every 1-2 months for continued goals of care and support. Hospice services are more physically supportive with regular RN visits, symptom management, and personal care assistance.  I reviewed that to establish if Palliative vs hospice would be the right choice for patient, we would first need to establish goals of care.  Brenda Matthews feels that Brenda Matthews would be able to participate in discussion and make decisions at another time. She is groggy today and unable.  Brenda Matthews inquired about reason for [name]'s altered mental status- we discussed variety of factors likely working together affecting her mental status including- heart failure in general with poor perfusion to brain, ICU delirium, poor po intake, elevated BUN. She hasn't had any sedating medications since yesterday.  We made plan to meet again tomorrow morning and reassess patient's mental status and ability to participate in discussion.    SUMMARY OF RECOMMENDATIONS -Chronic heart failure with repeated exacerbations becoming more frequent- family is hopeful for patient's mental status to improve so she can make decisions for herself.      Code Status/Advance Care Planning:   Code Status: Limited: Do not attempt resuscitation (DNR) -DNR-LIMITED -Do Not  Intubate/DNI     Prognosis:   Unable to determine  Discharge Planning: To Be Determined  Primary Diagnoses: Present on Admission:  Severe sepsis (HCC)  Acute on chronic diastolic heart failure (HCC)  PAF (paroxysmal atrial fibrillation) (HCC)  Acute renal failure superimposed on stage 3b chronic kidney disease (HCC)   Review of Systems  Unable to perform ROS   Physical Exam Vitals and nursing note reviewed.  Constitutional:      Appearance: She is ill-appearing.  Cardiovascular:     Rate and Rhythm: Normal rate.  Pulmonary:      Effort: Pulmonary effort is normal.  Skin:    General: Skin is warm and dry.  Neurological:     Comments: lethargic     Vital Signs: BP (!) 152/51   Pulse 83   Temp 98.6 F (37 C) (Axillary)   Resp (!) 29   Ht 5\' 1"  (1.549 m)   Wt 77.1 kg   LMP  (LMP Unknown)   SpO2 94%   BMI 32.12 kg/m  Pain Scale: 0-10 POSS *See Group Information*: S-Acceptable,Sleep, easy to arouse Pain Score: Asleep   SpO2: SpO2: 94 % O2 Device:SpO2: 94 % O2 Flow Rate: .O2 Flow Rate (L/min): 1 L/min  IO: Intake/output summary:  Intake/Output Summary (Last 24 hours) at 02/27/2023 1536 Last data filed at 02/27/2023 0400 Gross per 24 hour  Intake 300 ml  Output 500 ml  Net -200 ml    LBM: Last BM Date : 02/26/23 Baseline Weight: Weight: 77.2 kg Most recent weight: Weight: 77.1 kg       Thank you for this consult. Palliative medicine will continue to follow and assist as needed.  Time Total: 90 minutes Signed by: Micki Alas, AGNP-C Palliative Medicine  Time includes:   Preparing to see the patient (e.g., review of tests) Obtaining and/or reviewing separately obtained history Performing a medically necessary appropriate examination and/or evaluation Counseling and educating the patient/family/caregiver Ordering medications, tests, or procedures Referring and communicating with other health care professionals (when not reported separately) Documenting clinical information in the electronic or other health record Independently interpreting results (not reported separately) and communicating results to the patient/family/caregiver Care coordination (not reported separately) Clinical documentation   Please contact Palliative Medicine Team phone at [phone] for questions and concerns.  For individual provider: See Tilford Foley

## 2023-02-27 NOTE — Progress Notes (Signed)
   02/27/23 1400  Spiritual Encounters  Type of Visit Follow up  Care provided to: Pt and family  Referral source Patient request;Chaplain assessment;Clinical staff  Reason for visit Urgent spiritual support  OnCall Visit No  Spiritual Framework  Presenting Themes Meaning/purpose/sources of inspiration;Values and beliefs;Significant life change;Coping tools;Courage hope and growth;Rituals and practive  Patient Stress Factors Health changes;Loss of control  Family Stress Factors Loss of control;Major life changes  Interventions  Spiritual Care Interventions Made Established relationship of care and support;Compassionate presence;Reflective listening;Normalization of emotions;Narrative/life review;Meaning Nurse, mental health met with patient and daughter in law at bedside this afternoon.  Pt was alert and eating lunch.  Slightly tired due to medication.  Provided spiritual care and comfort and counsel to patient and family present.    Spoke with daughter in law outside room at her request.  She spoke of her obligation to heling her and that patient lives at [facility].  Chaplain provided encouragement and support to caregiver family.

## 2023-02-27 NOTE — Plan of Care (Signed)
  Problem: Education: Goal: Knowledge of General Education information will improve Description: Including pain rating scale, medication(s)/side effects and non-pharmacologic comfort measures Outcome: Progressing   Problem: Health Behavior/Discharge Planning: Goal: Ability to manage health-related needs will improve Outcome: Not Progressing   Problem: Clinical Measurements: Goal: Ability to maintain clinical measurements within normal limits will improve Outcome: Progressing Goal: Will remain free from infection Outcome: Progressing Goal: Diagnostic test results will improve Outcome: Progressing Goal: Respiratory complications will improve Outcome: Progressing Goal: Cardiovascular complication will be avoided Outcome: Progressing   

## 2023-02-27 NOTE — Progress Notes (Signed)
 Physical Therapy Treatment Patient Details Name: Brenda Matthews MRN: [id number] DOB: Dec 29, 1935 Today's Date: 02/27/2023   History of Present Illness Pt [place] [facility] hypotension, severe sepsis, bil LE cellulitis, AKI on CKD, dehydration, bil LE cellulitis.  Pt with hx o fDDD, PAF, arthritic knees, Chronic anemis, chronic LE edema, CKD, DM and CHF    PT Comments  Patient initi4ally not arousing to stimulation. Patient did become more alert, keeping eyes closed. Treatment limited due to  Patient  reporting significant pain, especially right heel and calf. Patient asking  therapist to stop moving her. Deferred any further mobility. [name] came into room and was updated that patient is not tolerating  therapy.  Will follow for GOC and patient ability to participate.    If plan is discharge home, recommend the following: Two people to help with walking and/or transfers;A lot of help with bathing/dressing/bathroom;Assistance with cooking/housework;Assist for transportation;Help with stairs or ramp for entrance   Can travel by private vehicle     No  Equipment Recommendations  None recommended by PT    Recommendations for Other Services       Precautions / Restrictions Precautions Precautions: Fall Precaution Comments: monitor vitals, severe pain in lower legs limiting mobility Restrictions Weight Bearing Restrictions Per Provider Order: No     Mobility  Bed Mobility Overal bed mobility: Needs Assistance             General bed mobility comments: patient resistive and moaning with efforts  to roll. patient resistive and asking for therapist to stop.    Transfers                   General transfer comment: Not able to complete secondary to pain    Ambulation/Gait                   Stairs             Wheelchair Mobility     Tilt Bed    Modified Rankin (Stroke Patients Only)       Balance                                             Cognition Arousal: Obtunded Behavior During Therapy: Anxious                                   General Comments: Pt internally distracted by pain.   Attempted to move legs with patient  stating 'Nonono: don't touch my legs. "        Exercises      General Comments        Pertinent Vitals/Pain Pain Assessment Breathing: occasional labored breathing, short period of hyperventilation Negative Vocalization: occasional moan/groan, low speech, negative/disapproving quality Facial Expression: sad, frightened, frown Body Language: tense, distressed pacing, fidgeting Consolability: distracted or reassured by voice/touch PAINAD Score: 5 Pain Location: BLEs Pain Descriptors / Indicators: Discomfort, Moaning, Penetrating Pain Intervention(s): Limited activity within patient's tolerance, Repositioned    Home Living                          Prior Function            PT Goals (current goals can now be found in the  care plan section) Progress towards PT goals: Not progressing toward goals - comment (patient  appears in pain, and states so, unable to progress.)    Frequency    Min 1X/week      PT Plan      Co-evaluation              AM-PAC PT "6 Clicks" Mobility   Outcome Measure  Help needed turning from your back to your side while in a flat bed without using bedrails?: Total Help needed moving from lying on your back to sitting on the side of a flat bed without using bedrails?: Total Help needed moving to and from a bed to a chair (including a wheelchair)?: Total Help needed standing up from a chair using your arms (e.g., wheelchair or bedside chair)?: Total Help needed to walk in hospital room?: Total Help needed climbing 3-5 steps with a railing? : Total 6 Click Score: 6    End of Session   Activity Tolerance: Patient limited by pain Patient left: in bed;with call bell/phone within  reach;with bed alarm set Nurse Communication: Mobility status PT Visit Diagnosis: Muscle weakness (generalized) (M62.81);Pain Pain - Right/Left: Right Pain - part of body: Ankle and joints of foot     Time: 1610-9604 PT Time Calculation (min) (ACUTE ONLY): 12 min  Charges:    $Therapeutic Activity: 8-22 mins PT General Charges $$ ACUTE PT VISIT: 1 Visit                     Abelina Hoes PT Acute Rehabilitation Services Office [phone] Weekend [phone]    Dareen Ebbing 02/27/2023, 4:23 PM

## 2023-02-27 NOTE — Progress Notes (Signed)
 PT Cancellation Note  Patient Details Name: Brenda Matthews MRN: [id number] DOB: [birth year]   Cancelled Treatment:    Reason Eval/Treat Not Completed: Fatigue/lethargy limiting ability to participate RN reports patient up most of night,now resting. Will check back  as schedule allows. Abelina Hoes PT Acute Rehabilitation Services Office [phone] Weekend [phone]  Dareen Ebbing 02/27/2023, 11:21 AM

## 2023-02-27 NOTE — TOC Progression Note (Signed)
 Transition of Care [facility]) - Progression Note   Patient Details  Name: Brenda Matthews MRN: [id number] Date of Birth: Nov 30, 1935  Transition of Care [facility]) CM/SW Contact  Zenon Hilda, LCSW Phone Number: 02/27/2023, 12:03 PM  Clinical Narrative: PT recommended SNF. FL2 done; PASRR confirmed. Initial referral faxed to [facility] in hub. TOC to start insurance authorization closer to patient being medically ready.  Expected Discharge Plan: Skilled Nursing Facility Barriers to Discharge: Continued Medical Work up  Expected Discharge Plan and Services In-house Referral: Clinical Social Work Living arrangements for the past 2 months: Independent Living Facility  Social Determinants of Health (SDOH) Interventions SDOH Screenings   Food Insecurity: No Food Insecurity (02/23/2023)  Housing: Low Risk  (02/23/2023)  Transportation Needs: No Transportation Needs (02/23/2023)  Utilities: Not At Risk (02/23/2023)  Alcohol  Screen: Low Risk  (10/11/2020)  Depression (PHQ2-9): Low Risk  (09/01/2022)  Financial Resource Strain: Low Risk  (10/27/2021)  Physical Activity: Sufficiently Active (10/27/2021)  Social Connections: Moderately Integrated (02/23/2023)  Stress: No Stress Concern Present (10/27/2021)  Tobacco Use: Low Risk  (02/23/2023)   Readmission Risk Interventions    02/24/2023   12:52 PM 02/06/2023    2:09 PM 10/24/2022    9:08 AM  Readmission Risk Prevention Plan  Post Dischage Appt   Complete  Medication Screening   Complete  Transportation Screening Complete Complete Complete  HRI or Home Care Consult  Complete   Social Work Consult for Recovery Care Planning/Counseling  Complete   Palliative Care Screening  Not Applicable   Medication Review Oceanographer) Complete Complete   HRI or Home Care Consult Complete    SW Recovery Care/Counseling Consult Complete    Palliative Care Screening Not Applicable    Skilled Nursing Facility Complete

## 2023-02-27 NOTE — Progress Notes (Signed)
 Progress Note  Patient Name: Brenda Matthews Date of Encounter: 02/27/2023  Primary Cardiologist: Hazle Lites, MD   Subjective   Patient seen and examined at her bedside.  Her daughter-in-law was by the bedside.  Inpatient Medications    Scheduled Meds:  sodium chloride    Intravenous Once   amiodarone   200 mg Oral BID   Followed by   Cecily Cohen ON 03/07/2023] amiodarone   200 mg Oral Daily   apixaban   2.5 mg Oral BID   atorvastatin   40 mg Oral Daily   calcium  carbonate  1,250 mg Oral Daily   Chlorhexidine  Gluconate Cloth  6 each Topical Daily   fluticasone   2 spray Each Nare Daily   furosemide   40 mg Intravenous BID   gabapentin   100 mg Oral BID   Continuous Infusions:  cefTRIAXone  (ROCEPHIN )  IV Stopped (02/26/23 1737)   PRN Meds: acetaminophen  **OR** acetaminophen , albuterol , ALPRAZolam , ondansetron  **OR** ondansetron  (ZOFRAN ) IV, mouth rinse, senna-docusate   Vital Signs    Vitals:   02/27/23 0500 02/27/23 0600 02/27/23 0700 02/27/23 0800  BP: (!) 154/37 (!) 142/61 (!) 137/41   Pulse: 86 82 84   Resp: (!) 33 (!) 32 (!) 24   Temp:    98.4 F (36.9 C)  TempSrc:    Axillary  SpO2: 97% 98% 97%   Weight: 77.1 kg     Height:        Intake/Output Summary (Last 24 hours) at 02/27/2023 1029 Last data filed at 02/27/2023 0400 Gross per 24 hour  Intake 300 ml  Output 700 ml  Net -400 ml   Filed Weights   02/23/23 2131 02/26/23 0600 02/27/23 0500  Weight: 77.2 kg 76.8 kg 77.1 kg    Telemetry     - Personally Reviewed  ECG     - Personally Reviewed  Physical Exam     General: Comfortable Head: Atraumatic, normal size  Eyes: PEERLA, EOMI  Neck: Supple, normal JVD Cardiac: Normal S1, S2; RRR; no murmurs, rubs, or gallops Lungs: Clear to auscultation bilaterally Abd: Soft, nontender, no hepatomegaly  Ext: warm, no edema Musculoskeletal: No deformities, BUE and BLE strength normal and equal Skin: Warm and dry, no rashes   Neuro: Alert and oriented to  person, place, time, and situation, CNII-XII grossly intact, no focal deficits  Psych: Normal mood and affect   Labs    Chemistry Recent Labs  Lab 02/23/23 1539 02/24/23 0339 02/26/23 0307 02/27/23 0746  NA 140 142 140 138  K 4.3 4.4 3.9 3.9  CL 104 108 106 105  CO2 26 22 25 24   GLUCOSE 94 108* 124* 95  BUN 56* 51* 53* 51*  CREATININE 2.69* 2.65* 2.16* 1.93*  CALCIUM  8.3* 8.2* 8.5* 8.5*  PROT 5.2*  --   --   --   ALBUMIN 1.9*  --   --   --   AST 49*  --   --   --   ALT 35  --   --   --   ALKPHOS 76  --   --   --   BILITOT 0.5  --   --   --   GFRNONAA 17* 17* 22* 25*  ANIONGAP 10 12 9 9      Hematology Recent Labs  Lab 02/23/23 1539 02/24/23 0339 02/27/23 0746  WBC 13.5* 13.2* 17.7*  RBC 2.65* 3.19* 2.68*  HGB 7.3* 8.6* 7.4*  HCT 24.8* 31.1* 25.2*  MCV 93.6 97.5 94.0  MCH 27.5 27.0 27.6  MCHC 29.4* 27.7* 29.4*  RDW 16.1* 16.3* 16.3*  PLT 269 289 286    Cardiac EnzymesNo results for input(s): "TROPONINI" in the last 168 hours. No results for input(s): "TROPIPOC" in the last 168 hours.   BNP Recent Labs  Lab 02/23/23 1539  BNP 650.2*     DDimer No results for input(s): "DDIMER" in the last 168 hours.   Radiology    ECHOCARDIOGRAM LIMITED Result Date: 02/26/2023    ECHOCARDIOGRAM LIMITED REPORT   Patient Name:   Brenda Matthews Date of Exam: 02/26/2023 Medical Rec #:  [id number]    Height:       61.0 in Accession #:    [id number]   Weight:       169.3 lb Date of Birth:  January 03, 1936    BSA:          1.760 m Patient Age:    88 years     BP:           111/38 mmHg Patient Gender: F            HR:           70 bpm. Exam Location:  Inpatient Procedure: Limited Echo, Cardiac Doppler and Color Doppler Indications:    I50.40* Unspecified combined systolic (congestive) and diastolic                 (congestive) heart failure  History:        Patient has prior history of Echocardiogram examinations, most                 recent 01/03/2023. CHF, Abnormal ECG, Arrythmias:Atrial                  Fibrillation, Signs/Symptoms:Bacteremia, Edema, Shortness of                 Breath and Dyspnea; Risk Factors:Dyslipidemia.  Sonographer:    Raynelle Callow RDCS Referring Phys: [phone] PHILIP J NAHSER  Sonographer Comments: Technically difficult study due to poor echo windows. Image acquisition challenging due to patient body habitus. Patient could not follow commands. No on- axis apical. Patient would not respond, RN notified. Limited study to evaluate volume status and ventricular function. IMPRESSIONS  1. Left ventricular ejection fraction, by estimation, is 60 to 65%. The left ventricle has normal function. The left ventricle has no regional wall motion abnormalities. There is mild concentric left ventricular hypertrophy. Left ventricular diastolic parameters are consistent with Grade I diastolic dysfunction (impaired relaxation). There is the interventricular septum is flattened in systole and diastole, consistent with right ventricular pressure and volume overload.  2. Right ventricular systolic function is mildly reduced. The right ventricular size is mildly enlarged. There is moderately elevated pulmonary artery systolic pressure. The estimated right ventricular systolic pressure is 48.2 mmHg.  3. Left atrial size was mildly dilated.  4. Right atrial size was mildly dilated.  5. Left pleural effusion present.  6. The mitral valve is grossly normal. Mild mitral valve regurgitation.  7. Tricuspid valve regurgitation is moderate.  8. The aortic valve is tricuspid. Aortic valve regurgitation is mild.  9. Aortic dilatation noted. There is moderate dilatation of the ascending aorta, measuring 44 mm. 10. The inferior vena cava is dilated in size with <50% respiratory variability, suggesting right atrial pressure of 15 mmHg. Comparison(s): Prior images reviewed side by side. LVEF normal range at 60-65%. Mild RV dysfunction with moderately elevated estimated RVSP. FINDINGS  Left Ventricle: Left ventricular  ejection fraction, by  estimation, is 60 to 65%. The left ventricle has normal function. The left ventricle has no regional wall motion abnormalities. The left ventricular internal cavity size was normal in size. There is  mild concentric left ventricular hypertrophy. The interventricular septum is flattened in systole and diastole, consistent with right ventricular pressure and volume overload. Left ventricular diastolic parameters are consistent with Grade I diastolic dysfunction (impaired relaxation). Right Ventricle: The right ventricular size is mildly enlarged. No increase in right ventricular wall thickness. Right ventricular systolic function is mildly reduced. There is moderately elevated pulmonary artery systolic pressure. The tricuspid regurgitant velocity is 2.88 m/s, and with an assumed right atrial pressure of 15 mmHg, the estimated right ventricular systolic pressure is 48.2 mmHg. Left Atrium: Left atrial size was mildly dilated. Right Atrium: Right atrial size was mildly dilated. Pericardium: Left pleural effusion present. There is no evidence of pericardial effusion. Mitral Valve: The mitral valve is grossly normal. Mild mitral valve regurgitation. Tricuspid Valve: The tricuspid valve is normal in structure. Tricuspid valve regurgitation is moderate . No evidence of tricuspid stenosis. Aortic Valve: The aortic valve is tricuspid. Aortic valve regurgitation is mild. Pulmonic Valve: The pulmonic valve was not well visualized. Aorta: Aortic dilatation noted. There is moderate dilatation of the ascending aorta, measuring 44 mm. Venous: The inferior vena cava is dilated in size with less than 50% respiratory variability, suggesting right atrial pressure of 15 mmHg. IAS/Shunts: No atrial level shunt detected by color flow Doppler. Additional Comments: There is pleural effusion in the left lateral region. Spectral Doppler performed. Color Doppler performed.  LEFT VENTRICLE PLAX 2D LVIDd:         5.00 cm  LVIDs:         3.10 cm LV PW:         1.10 cm LV IVS:        1.10 cm  LV Volumes (MOD) LV vol d, MOD A2C: 62.0 ml LV vol d, MOD A4C: 106.0 ml LV vol s, MOD A2C: 20.0 ml LV vol s, MOD A4C: 35.6 ml LV SV MOD A2C:     42.0 ml LV SV MOD A4C:     106.0 ml LV SV MOD BP:      61.6 ml RIGHT VENTRICLE         IVC TAPSE (M-mode): 2.2 cm  IVC diam: 3.20 cm LEFT ATRIUM           Index        RIGHT ATRIUM           Index LA Vol (A2C): 15.2 ml 8.64 ml/m   RA Area:     23.30 cm LA Vol (A4C): 56.1 ml 31.88 ml/m  RA Volume:   68.80 ml  39.10 ml/m  AORTIC VALVE LVOT Vmax:   111.00 cm/s LVOT Vmean:  72.700 cm/s LVOT VTI:    0.215 m  AORTA Ao Asc diam: 4.35 cm MITRAL VALVE                TRICUSPID VALVE MV Area (PHT): 4.21 cm     TR Peak grad:   33.2 mmHg MV Decel Time: 180 msec     TR Vmax:        288.00 cm/s MV E velocity: 102.00 cm/s MV A velocity: 85.70 cm/s   SHUNTS MV E/A ratio:  1.19         Systemic VTI: 0.22 m Teddie Favre MD Electronically signed by Teddie Favre MD Signature Date/Time: 02/26/2023/3:21:46 PM  Final     Cardiac Studies     Patient Profile     88 y.o. female with a history of chronic diastolic congestive heart failure, atrial fibrillation with RVR, acute on chronic renal disease.   Assessment & Plan    Acute on chronic diastolic heart failure with echo suggestion of RV volume overloaded-she needs to be diuresed.  Creatinine slightly improving.  Net negative -400 cc.  Will continue to monitor this closely. I reviewed her repeat echocardiogram which still does have evidence of volume overload, noted pulmonary hypertension, moderate tricuspid regurgitation.  As noted she can benefit from at least another day of IV Lasix .  Hopefully by tomorrow we can transition her to p.o. diuretics likely torsemide .  Paroxysmal atrial fibrillation-in sinus rhythm continue current amiodarone  as well as Eliquis .      For questions or updates, please contact [facility] Please consult [email or web address]  for contact info under Cardiology/STEMI.      Signed, Maryam Feely, DO  02/27/2023, 10:29 AM

## 2023-02-27 NOTE — [place] FL2 (Signed)
 [place]  MEDICAID FL2 LEVEL OF CARE FORM     IDENTIFICATION  Patient Name: Brenda Matthews Birthdate: 1935/10/07 Sex: female Admission Date (Current Location): 02/23/2023  [facility] and [place] Number:  Producer, television/film/video and Address:  [facility],  [address], [place] [address]      Provider Number: [id number]  Attending Physician Name and Address:  Haydee Lipa, MD  Relative Name and Phone Number:  Oprah Handke (son) Ph: [phone]    Current Level of Care: Hospital Recommended Level of Care: Skilled Nursing Facility Prior Approval Number:    Date Approved/Denied:   PASRR Number: [id number]  Discharge Plan: SNF    Current Diagnoses: Patient Active Problem List   Diagnosis Date Noted   Bilateral lower leg cellulitis 02/24/2023   Severe sepsis (HCC) 02/23/2023   Acute diastolic (congestive) heart failure (HCC) 02/04/2023   Depression 01/11/2023   Cellulitis of right leg 01/09/2023   Hypokalemia 01/09/2023   Constipation 01/09/2023   Acute pulmonary edema (HCC) 01/03/2023   Acute respiratory failure with hypoxemia (HCC) 01/02/2023   High risk medication use 11/07/2022   Acute bilateral low back pain without sciatica 11/07/2022   ARF (acute renal failure) (HCC) 10/23/2022   Acute renal failure superimposed on stage 3b chronic kidney disease (HCC) 10/22/2022   Chronic heart failure with preserved ejection fraction (HFpEF) (HCC) 10/22/2022   Elevated transaminase level 10/22/2022   Physical deconditioning 06/14/2022   Acute on chronic diastolic heart failure (HCC) 06/11/2022   Idiopathic acute pancreatitis without necrosis or infection 09/14/2020   Pancreatic cyst 09/14/2020   Pancreatic duct dilated 09/14/2020   Pure hypercholesterolemia 09/14/2020   Hypertensive heart and chronic kidney disease with heart failure and stage 1 through stage 4 chronic kidney disease, or unspecified chronic kidney disease (HCC) 09/28/2018   Spinal stenosis,  lumbar region with neurogenic claudication 09/26/2018   Osteoarthritis of right knee 11/21/2017   Acute on chronic diastolic CHF (congestive heart failure) (HCC)    S/P thoracentesis    Acute respiratory failure (HCC)    Rash in adult 08/30/2017   Atrial fibrillation and flutter (HCC)    DOE (dyspnea on exertion) 08/29/2017   Arterial hypotension    Chronic anticoagulation 12/19/2016   AKI (acute kidney injury) (HCC)    Low TSH level 02/19/2016   PAF (paroxysmal atrial fibrillation) (HCC) 02/18/2016   Ankle fracture, left-s/p surgery 02/18/16 02/18/2016   Closed displaced trimalleolar fracture of left ankle 02/18/2016   Acute bronchitis 02/10/2015   Bronchopneumonia 12/02/2013   Chronic kidney disease, stage 3b (HCC) 12/04/2012   Hx of adenomatous colonic polyps    Routine general medical examination at a health care facility 09/05/2011   Cervical cancer screening 02/24/2011   SCIATICA, BILATERAL 09/01/2009   Sprain of sacroiliac region 09/01/2009   ANEMIA 11/07/2007   Osteopenia of the elderly 11/07/2007   INSOMNIA 11/07/2007   EDEMA 08/14/2007   Hyperlipidemia 04/04/2007   PANIC DISORDER 04/04/2007   Essential hypertension 04/04/2007   Anxiety state 12/27/2006   Arthropathy 12/27/2006    Orientation RESPIRATION BLADDER Height & Weight     Self, Time, Situation, Place  O2 (1L/min) Continent Weight: 169 lb 15.6 oz (77.1 kg) Height:  5\' 1"  (154.9 cm)  BEHAVIORAL SYMPTOMS/MOOD NEUROLOGICAL BOWEL NUTRITION STATUS      Continent Diet (Heart healthy diet)  AMBULATORY STATUS COMMUNICATION OF NEEDS Skin   Extensive Assist Verbally Other (Comment) (Erythema: buttocks, toe; Ecchymosis: bilateral arms; Blisters: buttocks)  Personal Care Assistance Level of Assistance  Bathing, Feeding, Dressing Bathing Assistance: Maximum assistance Feeding assistance: Limited assistance Dressing Assistance: Maximum assistance     Functional Limitations Info  Sight,  Hearing, Speech Sight Info: Impaired Hearing Info: Adequate Speech Info: Adequate    SPECIAL CARE FACTORS FREQUENCY  PT (By licensed PT), OT (By licensed OT)     PT Frequency: 5x's/week OT Frequency: 5x's/week            Contractures Contractures Info: Not present    Additional Factors Info  Code Status, Allergies Code Status Info: Full Allergies Info: Augmentin  (Amoxicillin -pot Clavulanate), Amoxicillin , Clindamycin/lincomycin           Current Medications (02/27/2023):  This is the current hospital active medication list Current Facility-Administered Medications  Medication Dose Route Frequency Provider Last Rate Last Admin   0.9 %  sodium chloride  infusion (Manually program via Guardrails IV Fluids)   Intravenous Once Amponsah, Prosper M, MD   Paused at 02/24/23 0058   acetaminophen  (TYLENOL ) tablet 650 mg  650 mg Oral Q6H PRN Amponsah, Prosper M, MD   650 mg at 02/26/23 2102   Or   acetaminophen  (TYLENOL ) suppository 650 mg  650 mg Rectal Q6H PRN Vita Grip, MD       albuterol  (PROVENTIL ) (2.5 MG/3ML) 0.083% nebulizer solution 3 mL  3 mL Inhalation Q4H PRN Vita Grip, MD       ALPRAZolam  (XANAX ) tablet 0.5 mg  0.5 mg Oral Q8H PRN Amponsah, Prosper M, MD   0.5 mg at 02/26/23 1448   amiodarone  (PACERONE ) tablet 200 mg  200 mg Oral BID Amponsah, Prosper M, MD   200 mg at 02/27/23 1014   Followed by   Cecily Cohen ON 03/07/2023] amiodarone  (PACERONE ) tablet 200 mg  200 mg Oral Daily Vita Grip, MD       apixaban  (ELIQUIS ) tablet 2.5 mg  2.5 mg Oral BID Amponsah, Prosper M, MD   2.5 mg at 02/27/23 1014   atorvastatin  (LIPITOR) tablet 40 mg  40 mg Oral Daily Amponsah, Prosper M, MD   40 mg at 02/27/23 1014   calcium  carbonate (OS-CAL - dosed in mg of elemental calcium ) tablet 1,250 mg  1,250 mg Oral Daily Vita Grip, MD   1,250 mg at 02/27/23 1014   cefTRIAXone  (ROCEPHIN ) 2 g in sodium chloride  0.9 % 100 mL IVPB  2 g Intravenous Q24H Amponsah,  Prosper M, MD   Stopped at 02/26/23 1737   Chlorhexidine  Gluconate Cloth 2 % PADS 6 each  6 each Topical Daily Amponsah, Prosper M, MD   6 each at 02/26/23 2102   fluticasone  (FLONASE ) 50 MCG/ACT nasal spray 2 spray  2 spray Each Nare Daily Vita Grip, MD   2 spray at 02/27/23 1015   furosemide  (LASIX ) injection 40 mg  40 mg Intravenous BID Haydee Lipa, MD   40 mg at 02/27/23 0981   gabapentin  (NEURONTIN ) capsule 100 mg  100 mg Oral BID Amponsah, Prosper M, MD   100 mg at 02/27/23 1014   ondansetron  (ZOFRAN ) tablet 4 mg  4 mg Oral Q6H PRN Amponsah, Prosper M, MD   4 mg at 02/25/23 1914   Or   ondansetron  (ZOFRAN ) injection 4 mg  4 mg Intravenous Q6H PRN Amponsah, Prosper M, MD       Oral care mouth rinse  15 mL Mouth Rinse PRN Haydee Lipa, MD       senna-docusate (Senokot-S) tablet 1 tablet  1 tablet  Oral QHS PRN Amponsah, Prosper M, MD   1 tablet at 02/26/23 2102     Discharge Medications: Please see discharge summary for a list of discharge medications.  Relevant Imaging Results:  Relevant Lab Results:   Additional Information SSN: [id number]  Zenon Hilda, LCSW

## 2023-02-27 NOTE — Progress Notes (Signed)
 PROGRESS NOTE    Brenda Matthews  WUJ:[id number] DOB: [birth year] DOA: 02/23/2023 PCP: Brenda Luz, MD   Brief Narrative:  Brenda Matthews is a 88 y.o. female with medical history significant for chronic anemia, chronic leg edema, CKD 3B, non-insulin -dependent diabetes, hypertension, hyperlipidemia, atrial fibrillation on Eliquis , anxiety, DDD, and chronic diastolic heart failure who presented from her [facility] admission of fever, leg swelling and redness. (Recently hospitalized 12/16 and 1/18 for CHF exacerbation/Afib RVR). Hospitalist called for admission.  Assessment & Plan:   Principal Problem:   Severe sepsis (HCC) Active Problems:   PAF (paroxysmal atrial fibrillation) (HCC)   Acute on chronic diastolic heart failure (HCC)   Acute renal failure superimposed on stage 3b chronic kidney disease (HCC)   Bilateral lower leg cellulitis  Goals of care -Lengthy discussion with family over the phone daily -Discussed at length with patient today, while she is averse to the words "palliative" and "hospice" we discussed there are treatments that she would not want including things like surgery and that moving forward with palliative is just deciding what she does and does not want to including her health care. -Request palliative care consult for further information and discussion with family and patient about goals of care moving forward as well as will likely need palliative care follow up in the outpatient setting -Patient is now DNR per discussion with son, healthcare power of attorney, given previous discussion with patient concurrent with her wishes.  Acute hypoxic respiratory failure, multifactorial, improving Rule out heart failure exacerbation/volume overload, HFpEF Pleural effusion, right -Cardiology following along, appreciate insight recommendations -Received multiple boluses and IV fluids at intake due to hypotension -Continue diuresis per cardiology, likely transition to p.o. in the  next 24 to 48 hours -Echo remarkable for dilated IVC with minimal contraction concerning for volume overload otherwise no significant changes from prior -Oxygen continues to be labile, 1 to 2 L necessary even at rest to maintain sats greater than 90  Severe sepsis Hypotension BLE cellulitis -Continue ceftriaxone  given nonpurulent erythema, cultures pending -Hypotension improved with IV fluids, remains borderline hypotensive but avoiding further IV fluids in the setting of heart failure exacerbation -Bilateral lower extremity edema improving, erythema resolving -Bilateral lower extremity edema may be the etiology of patient's erythema rather than overt infection.  Given erythema continues to erupt with edema the timing is suspicious.  Will complete antibiotic course as above but discussed with family that recurrent erythema does not necessarily indicate infection.  AKI on CKD 3B -Baseline creatinine labile typically around 1.5 -Creatinine elevated to 2.7 at intake  -Worried patient's renal function may be advancing in the setting of above -Likely prerenal in the setting of hypotension questionable sepsis  -Improving with diuretics  Urinary retention, unspecified  -Patient has notable history of urinary retention when hospitalized, unclear etiology  -Foley placed 02/26/2023  -will attempt voiding trial prior to discharge, if unable to void successfully will replace Foley and have patient follow-up outpatient with urology(per son this is been necessary in the past)  A-fib, rate controlled -Status post cardioversion 1/28  -Continue amiodarone  and apixaban   Chronic anemia of chronic disease -Previously evaluated with normal iron and ferritin folate and B12 levels -Continue to monitor -baseline hemoglobin around 8   HTN, essential Hypotensive at intake in the setting of presumed sepsis, resolved with IV fluids, hold antihypertensives   Resting tremors -Nonspecific, continue to monitor    HLD -Continue statin   Anxiety - Continue as needed Xanax  - given patient's age -  would recommend discontinuation of this medication in the near future   Neuropathic pain - Continue gabapentin    Generalized deconditioning - In the setting of cellulitis and dehydration - PT OT to follow  DVT prophylaxis: apixaban  (ELIQUIS ) tablet 2.5 mg Start: 02/23/23 2215 Place TED hose Start: 02/23/23 1830 apixaban  (ELIQUIS ) tablet 2.5 mg   Code Status:   Code Status: Limited: Do not attempt resuscitation (DNR) -DNR-LIMITED -Do Not Intubate/DNI   Family Communication: Updated over the phone  Status is: Inpatient  Dispo: The patient is from: [employer] d/c is to: Same              Anticipated d/c date is: 24 to 48 hours              Patient currently not medically stable for discharge  Consultants:  Cardiology  Procedures:  None  Antimicrobials:  Ceftriaxone   Subjective: No acute issues or events overnight -patient more awake and alert this morning, review of systems unremarkable for chest pain shortness of breath nausea vomiting diarrhea constipation headache or fevers.  Bilateral lower extremity pain appears to be improving per patient as well  Objective: Vitals:   02/27/23 0300 02/27/23 0301 02/27/23 0400 02/27/23 0500  BP: (!) 149/44  (!) 145/51   Pulse: 85 83 82   Resp: (!) 32 (!) 21 (!) 21   Temp:   98.5 F (36.9 C)   TempSrc:   Oral   SpO2: 94% 93% 98%   Weight:    77.1 kg  Height:        Intake/Output Summary (Last 24 hours) at 02/27/2023 0659 Last data filed at 02/27/2023 0400 Gross per 24 hour  Intake 300 ml  Output 700 ml  Net -400 ml   Filed Weights   02/23/23 2131 02/26/23 0600 02/27/23 0500  Weight: 77.2 kg 76.8 kg 77.1 kg    Examination:  General:  Pleasantly resting in bed, No acute distress. HEENT:  Normocephalic atraumatic.  Sclerae nonicteric, noninjected.  Extraocular movements intact bilaterally. Neck:  Without mass  or deformity.  Trachea is midline. Lungs:  Clear to auscultate bilaterally without rhonchi, wheeze, or rales. Heart:  Regular rate and rhythm.  Without murmurs, rubs, or gallops. Abdomen:  Soft, nontender, nondistended.  Without guarding or rebound. Extremities: Bilateral lower extremity stockings Skin:  Warm and dry, no erythema(other than listed above).  Data Reviewed: I have personally reviewed following labs and imaging studies  CBC: Recent Labs  Lab 02/23/23 1539 02/24/23 0339  WBC 13.5* 13.2*  NEUTROABS 11.3*  --   HGB 7.3* 8.6*  HCT 24.8* 31.1*  MCV 93.6 97.5  PLT 269 289   Basic Metabolic Panel: Recent Labs  Lab 02/23/23 1539 02/24/23 0339 02/26/23 0307  NA 140 142 140  K 4.3 4.4 3.9  CL 104 108 106  CO2 26 22 25   GLUCOSE 94 108* 124*  BUN 56* 51* 53*  CREATININE 2.69* 2.65* 2.16*  CALCIUM  8.3* 8.2* 8.5*  MG  --  2.3  --   PHOS  --  5.0*  --    GFR: Estimated Creatinine Clearance: 17.6 mL/min (A) (by C-G formula based on SCr of 2.16 mg/dL (H)). Liver Function Tests: Recent Labs  Lab 02/23/23 1539  AST 49*  ALT 35  ALKPHOS 76  BILITOT 0.5  PROT 5.2*  ALBUMIN 1.9*   Sepsis Labs: Recent Labs  Lab 02/23/23 1552  LATICACIDVEN 0.7  Recent Results (from the past 240 hours)  Culture, blood (routine x 2)     Status: None (Preliminary result)   Collection Time: 02/23/23  3:39 PM   Specimen: BLOOD LEFT HAND  Result Value Ref Range Status   Specimen Description   Final    BLOOD LEFT HAND Performed at [facility], [address], [place], [place] [address]    Special Requests   Final    BOTTLES DRAWN AEROBIC AND ANAEROBIC Blood Culture adequate volume Performed at [facility], [address], [place], [place] [address]    Culture   Final    NO GROWTH 3 DAYS Performed at [facility], [address], [place], [place] [address]    Report Status PENDING  Incomplete  Culture, blood (routine x 2)     Status: None  (Preliminary result)   Collection Time: 02/23/23  3:55 PM   Specimen: BLOOD RIGHT WRIST  Result Value Ref Range Status   Specimen Description   Final    BLOOD RIGHT WRIST Performed at [facility], [address], [place], [place] [address]    Special Requests   Final    BOTTLES DRAWN AEROBIC AND ANAEROBIC Blood Culture adequate volume Performed at [facility], [address] [place], [place] [address]    Culture   Final    NO GROWTH 3 DAYS Performed at [facility], [address], [place], [place] [address]    Report Status PENDING  Incomplete  Resp panel by RT-PCR (RSV, Flu A&B, Covid) Anterior Nasal Swab     Status: None   Collection Time: 02/23/23  6:56 PM   Specimen: Anterior Nasal Swab  Result Value Ref Range Status   SARS Coronavirus 2 by RT PCR NEGATIVE NEGATIVE Final    Comment: (NOTE) SARS-CoV-2 target nucleic acids are NOT DETECTED.  The SARS-CoV-2 RNA is generally detectable in upper respiratory specimens during the acute phase of infection. The lowest concentration of SARS-CoV-2 viral copies this assay can detect is 138 copies/mL. A negative result does not preclude SARS-Cov-2 infection and should not be used as the sole basis for treatment or other patient management decisions. A negative result may occur with  improper specimen collection/handling, submission of specimen other than nasopharyngeal swab, presence of viral mutation(s) within the areas targeted by this assay, and inadequate number of viral copies(<138 copies/mL). A negative result must be combined with clinical observations, patient history, and epidemiological information. The expected result is Negative.  Fact Sheet for Patients:  BloggerCourse.com  Fact Sheet for Healthcare Providers:  SeriousBroker.it  This test is no t yet approved or cleared by the [place]  FDA and  has been authorized for detection  and/or diagnosis of SARS-CoV-2 by FDA under an Emergency Use Authorization (EUA). This EUA will remain  in effect (meaning this test can be used) for the duration of the COVID-19 declaration under Section 564(b)(1) of the Act, 21 U.S.C.section 360bbb-3(b)(1), unless the authorization is terminated  or revoked sooner.       Influenza A by PCR NEGATIVE NEGATIVE Final   Influenza B by PCR NEGATIVE NEGATIVE Final    Comment: (NOTE) The Xpert Xpress SARS-CoV-2/FLU/RSV plus assay is intended as an aid in the diagnosis of influenza from Nasopharyngeal swab specimens and should not be used as a sole basis for treatment. Nasal washings and aspirates are unacceptable for Xpert Xpress SARS-CoV-2/FLU/RSV testing.  Fact Sheet for Patients: BloggerCourse.com  Fact Sheet  for Healthcare Providers: SeriousBroker.it  This test is not yet approved or cleared by the [place]  FDA and has been authorized for detection and/or diagnosis of SARS-CoV-2 by FDA under an Emergency Use Authorization (EUA). This EUA will remain in effect (meaning this test can be used) for the duration of the COVID-19 declaration under Section 564(b)(1) of the Act, 21 U.S.C. section 360bbb-3(b)(1), unless the authorization is terminated or revoked.     Resp Syncytial Virus by PCR NEGATIVE NEGATIVE Final    Comment: (NOTE) Fact Sheet for Patients: BloggerCourse.com  Fact Sheet for Healthcare Providers: SeriousBroker.it  This test is not yet approved or cleared by the [place]  FDA and has been authorized for detection and/or diagnosis of SARS-CoV-2 by FDA under an Emergency Use Authorization (EUA). This EUA will remain in effect (meaning this test can be used) for the duration of the COVID-19 declaration under Section 564(b)(1) of the Act, 21 U.S.C. section 360bbb-3(b)(1), unless the authorization is terminated  or revoked.  Performed at [facility], [address] [place], [place] [address]   MRSA Next Gen by PCR, Nasal     Status: None   Collection Time: 02/24/23  1:17 AM   Specimen: Nasal Mucosa; Nasal Swab  Result Value Ref Range Status   MRSA by PCR Next Gen NOT DETECTED NOT DETECTED Final    Comment: (NOTE) The GeneXpert MRSA Assay (FDA approved for NASAL specimens only), is one component of a comprehensive MRSA colonization surveillance program. It is not intended to diagnose MRSA infection nor to guide or monitor treatment for MRSA infections. Test performance is not FDA approved in patients less than 9 years old. Performed at [facility], [address] [place], [place] [address]     Radiology Studies: ECHOCARDIOGRAM LIMITED Result Date: 02/26/2023    ECHOCARDIOGRAM LIMITED REPORT   Patient Name:   Brenda Matthews Date of Exam: 02/26/2023 Medical Rec #:  [id number]    Height:       61.0 in Accession #:    [id number]   Weight:       169.3 lb Date of Birth:  [birth year]    BSA:          1.760 m Patient Age:    88 years     BP:           111/38 mmHg Patient Gender: F            HR:           70 bpm. Exam Location:  Inpatient Procedure: Limited Echo, Cardiac Doppler and Color Doppler Indications:    I50.40* Unspecified combined systolic (congestive) and diastolic                 (congestive) heart failure  History:        Patient has prior history of Echocardiogram examinations, most                 recent 01/03/2023. CHF, Abnormal ECG, Arrythmias:Atrial                 Fibrillation, Signs/Symptoms:Bacteremia, Edema, Shortness of                 Breath and Dyspnea; Risk Factors:Dyslipidemia.  Sonographer:    Raynelle Callow RDCS Referring Phys: [phone] PHILIP J NAHSER  Sonographer Comments: Technically difficult study due to poor echo windows. Image acquisition challenging due to patient body habitus. Patient could not follow commands. No on- axis apical. Patient  would  not respond, RN notified. Limited study to evaluate volume status and ventricular function. IMPRESSIONS  1. Left ventricular ejection fraction, by estimation, is 60 to 65%. The left ventricle has normal function. The left ventricle has no regional wall motion abnormalities. There is mild concentric left ventricular hypertrophy. Left ventricular diastolic parameters are consistent with Grade I diastolic dysfunction (impaired relaxation). There is the interventricular septum is flattened in systole and diastole, consistent with right ventricular pressure and volume overload.  2. Right ventricular systolic function is mildly reduced. The right ventricular size is mildly enlarged. There is moderately elevated pulmonary artery systolic pressure. The estimated right ventricular systolic pressure is 48.2 mmHg.  3. Left atrial size was mildly dilated.  4. Right atrial size was mildly dilated.  5. Left pleural effusion present.  6. The mitral valve is grossly normal. Mild mitral valve regurgitation.  7. Tricuspid valve regurgitation is moderate.  8. The aortic valve is tricuspid. Aortic valve regurgitation is mild.  9. Aortic dilatation noted. There is moderate dilatation of the ascending aorta, measuring 44 mm. 10. The inferior vena cava is dilated in size with <50% respiratory variability, suggesting right atrial pressure of 15 mmHg. Comparison(s): Prior images reviewed side by side. LVEF normal range at 60-65%. Mild RV dysfunction with moderately elevated estimated RVSP. FINDINGS  Left Ventricle: Left ventricular ejection fraction, by estimation, is 60 to 65%. The left ventricle has normal function. The left ventricle has no regional wall motion abnormalities. The left ventricular internal cavity size was normal in size. There is  mild concentric left ventricular hypertrophy. The interventricular septum is flattened in systole and diastole, consistent with right ventricular pressure and volume overload. Left ventricular  diastolic parameters are consistent with Grade I diastolic dysfunction (impaired relaxation). Right Ventricle: The right ventricular size is mildly enlarged. No increase in right ventricular wall thickness. Right ventricular systolic function is mildly reduced. There is moderately elevated pulmonary artery systolic pressure. The tricuspid regurgitant velocity is 2.88 m/s, and with an assumed right atrial pressure of 15 mmHg, the estimated right ventricular systolic pressure is 48.2 mmHg. Left Atrium: Left atrial size was mildly dilated. Right Atrium: Right atrial size was mildly dilated. Pericardium: Left pleural effusion present. There is no evidence of pericardial effusion. Mitral Valve: The mitral valve is grossly normal. Mild mitral valve regurgitation. Tricuspid Valve: The tricuspid valve is normal in structure. Tricuspid valve regurgitation is moderate . No evidence of tricuspid stenosis. Aortic Valve: The aortic valve is tricuspid. Aortic valve regurgitation is mild. Pulmonic Valve: The pulmonic valve was not well visualized. Aorta: Aortic dilatation noted. There is moderate dilatation of the ascending aorta, measuring 44 mm. Venous: The inferior vena cava is dilated in size with less than 50% respiratory variability, suggesting right atrial pressure of 15 mmHg. IAS/Shunts: No atrial level shunt detected by color flow Doppler. Additional Comments: There is pleural effusion in the left lateral region. Spectral Doppler performed. Color Doppler performed.  LEFT VENTRICLE PLAX 2D LVIDd:         5.00 cm LVIDs:         3.10 cm LV PW:         1.10 cm LV IVS:        1.10 cm  LV Volumes (MOD) LV vol d, MOD A2C: 62.0 ml LV vol d, MOD A4C: 106.0 ml LV vol s, MOD A2C: 20.0 ml LV vol s, MOD A4C: 35.6 ml LV SV MOD A2C:     42.0 ml LV SV MOD A4C:  106.0 ml LV SV MOD BP:      61.6 ml RIGHT VENTRICLE         IVC TAPSE (M-mode): 2.2 cm  IVC diam: 3.20 cm LEFT ATRIUM           Index        RIGHT ATRIUM           Index LA Vol  (A2C): 15.2 ml 8.64 ml/m   RA Area:     23.30 cm LA Vol (A4C): 56.1 ml 31.88 ml/m  RA Volume:   68.80 ml  39.10 ml/m  AORTIC VALVE LVOT Vmax:   111.00 cm/s LVOT Vmean:  72.700 cm/s LVOT VTI:    0.215 m  AORTA Ao Asc diam: 4.35 cm MITRAL VALVE                TRICUSPID VALVE MV Area (PHT): 4.21 cm     TR Peak grad:   33.2 mmHg MV Decel Time: 180 msec     TR Vmax:        288.00 cm/s MV E velocity: 102.00 cm/s MV A velocity: 85.70 cm/s   SHUNTS MV E/A ratio:  1.19         Systemic VTI: 0.22 m Teddie Favre MD Electronically signed by Teddie Favre MD Signature Date/Time: 02/26/2023/3:21:46 PM    Final     Scheduled Meds:  sodium chloride    Intravenous Once   amiodarone   200 mg Oral BID   Followed by   Cecily Cohen ON 03/07/2023] amiodarone   200 mg Oral Daily   apixaban   2.5 mg Oral BID   atorvastatin   40 mg Oral Daily   calcium  carbonate  1,250 mg Oral Daily   Chlorhexidine  Gluconate Cloth  6 each Topical Daily   fluticasone   2 spray Each Nare Daily   furosemide   40 mg Intravenous BID   gabapentin   100 mg Oral BID   Continuous Infusions:  cefTRIAXone  (ROCEPHIN )  IV Stopped (02/26/23 1737)    LOS: 4 days   Time spent:  Haydee Lipa, DO [facility]  If 7PM-7AM, please contact night-coverage [email or web address]  02/27/2023, 6:59 AM

## 2023-02-28 ENCOUNTER — Encounter (HOSPITAL_COMMUNITY): Payer: Self-pay | Admitting: Student

## 2023-02-28 DIAGNOSIS — I2721 Secondary pulmonary arterial hypertension: Secondary | ICD-10-CM

## 2023-02-28 DIAGNOSIS — I5033 Acute on chronic diastolic (congestive) heart failure: Secondary | ICD-10-CM | POA: Diagnosis not present

## 2023-02-28 DIAGNOSIS — A419 Sepsis, unspecified organism: Secondary | ICD-10-CM | POA: Diagnosis not present

## 2023-02-28 DIAGNOSIS — I371 Nonrheumatic pulmonary valve insufficiency: Secondary | ICD-10-CM | POA: Diagnosis not present

## 2023-02-28 DIAGNOSIS — I519 Heart disease, unspecified: Secondary | ICD-10-CM | POA: Diagnosis not present

## 2023-02-28 DIAGNOSIS — L03116 Cellulitis of left lower limb: Secondary | ICD-10-CM | POA: Diagnosis not present

## 2023-02-28 DIAGNOSIS — R6520 Severe sepsis without septic shock: Secondary | ICD-10-CM | POA: Diagnosis not present

## 2023-02-28 DIAGNOSIS — Z515 Encounter for palliative care: Secondary | ICD-10-CM | POA: Diagnosis not present

## 2023-02-28 LAB — CULTURE, BLOOD (ROUTINE X 2)
Culture: NO GROWTH
Culture: NO GROWTH
Special Requests: ADEQUATE
Special Requests: ADEQUATE

## 2023-02-28 LAB — BASIC METABOLIC PANEL
Anion gap: 9 (ref 5–15)
BUN: 47 mg/dL — ABNORMAL HIGH (ref 8–23)
CO2: 27 mmol/L (ref 22–32)
Calcium: 8.6 mg/dL — ABNORMAL LOW (ref 8.9–10.3)
Chloride: 104 mmol/L (ref 98–111)
Creatinine, Ser: 1.76 mg/dL — ABNORMAL HIGH (ref 0.44–1.00)
GFR, Estimated: 28 mL/min — ABNORMAL LOW (ref >60)
Glucose, Bld: 95 mg/dL (ref 70–99)
Potassium: 3.7 mmol/L (ref 3.5–5.1)
Sodium: 140 mmol/L (ref 135–145)

## 2023-02-28 MED ORDER — SENNOSIDES-DOCUSATE SODIUM 8.6-50 MG PO TABS
1.0000 | ORAL_TABLET | Freq: Two times a day (BID) | ORAL | Status: DC
Start: 2023-02-28 — End: 2023-03-01
  Administered 2023-02-28 – 2023-03-01 (×2): 1 via ORAL
  Filled 2023-02-28 (×3): qty 1

## 2023-02-28 MED ORDER — POLYETHYLENE GLYCOL 3350 17 G PO PACK
17.0000 g | PACK | Freq: Two times a day (BID) | ORAL | Status: DC
  Administered 2023-02-28 – 2023-03-02 (×3): 17 g via ORAL
  Filled 2023-02-28 (×5): qty 1

## 2023-02-28 MED ORDER — BISACODYL 10 MG RE SUPP
10.0000 mg | Freq: Once | RECTAL | Status: AC
  Administered 2023-02-28: 10 mg via RECTAL
  Filled 2023-02-28: qty 1

## 2023-02-28 MED ORDER — ACETAMINOPHEN 325 MG PO TABS
650.0000 mg | ORAL_TABLET | Freq: Three times a day (TID) | ORAL | Status: DC
  Administered 2023-02-28 – 2023-03-02 (×5): 650 mg via ORAL
  Filled 2023-02-28 (×6): qty 2

## 2023-02-28 MED ORDER — FLEET ENEMA RE ENEM: 1.0000 | ENEMA | Freq: Once | RECTAL | Status: DC | PRN

## 2023-02-28 MED ORDER — ALPRAZOLAM 0.25 MG PO TABS: 0.2500 mg | ORAL_TABLET | Freq: Two times a day (BID) | ORAL | Status: DC | PRN

## 2023-02-28 MED ORDER — DULOXETINE HCL 20 MG PO CPEP
20.0000 mg | ORAL_CAPSULE | Freq: Every day | ORAL | Status: DC
  Administered 2023-02-28 – 2023-03-02 (×3): 20 mg via ORAL
  Filled 2023-02-28 (×3): qty 1

## 2023-02-28 MED ORDER — ALPRAZOLAM 0.25 MG PO TABS
0.2500 mg | ORAL_TABLET | Freq: Two times a day (BID) | ORAL | Status: DC | PRN
  Administered 2023-02-28 – 2023-03-02 (×3): 0.25 mg via ORAL
  Filled 2023-02-28 (×3): qty 1

## 2023-02-28 MED ORDER — CHLORHEXIDINE GLUCONATE CLOTH 2 % EX PADS: 6.0000 | MEDICATED_PAD | Freq: Every day | CUTANEOUS | Status: DC

## 2023-02-28 NOTE — Progress Notes (Addendum)
Patient requesting an enema d/t the suppository not working. Notified J. Garner Nash, NP. Due to her renal function, he is unable to order an enema. Will continue to monitor.

## 2023-02-28 NOTE — Progress Notes (Signed)
PROGRESS NOTE    Brenda Matthews  WGN:[id number] DOB: 1935-02-04 DOA: 02/23/2023 PCP: Karna Dupes, MD   Brief Narrative:  Brenda Matthews is a 88 y.o. female with medical history significant for chronic anemia, chronic leg edema, CKD 3B, non-insulin-dependent diabetes, hypertension, hyperlipidemia, atrial fibrillation on Eliquis, anxiety, DDD, and chronic diastolic heart failure who presented from her SNF admission of fever, leg swelling and redness. (Recently hospitalized 12/16 and 1/18 for CHF exacerbation/Afib RVR). Hospitalist called for admission.  Assessment & Plan:   Principal Problem:   Severe sepsis (HCC) Active Problems:   PAF (paroxysmal atrial fibrillation) (HCC)   Acute on chronic diastolic heart failure (HCC)   Acute renal failure superimposed on stage 3b chronic kidney disease (HCC)   Bilateral lower leg cellulitis  Goals of care -Lengthy discussion with family daily -Discussed at length with patient 2/10 - while she is averse to the words "palliative" and "hospice" we discussed there are treatments that she would not want (including things like surgery) and that moving forward with palliative is just deciding what she does and does not want to including her health care. -Appreciate palliative care having discussion with family and patient about goals of care moving forward as well as will likely need palliative care follow up in the outpatient setting -Patient remains DNR per discussion with son, healthcare power of attorney, given previous discussion with patient concurrent with her wishes.  Acute hypoxic respiratory failure, multifactorial, improving Rule out heart failure exacerbation/volume overload, HFpEF Pleural effusion, right -Cardiology following along, appreciate insight recommendations -Received multiple boluses and IV fluids at intake due to hypotension -Continue diuresis per cardiology, likely transition to p.o. in the next 24 to 48 hours pending volume  status -Echo remarkable for dilated IVC with minimal contraction concerning for volume overload otherwise no significant changes from prior -Oxygen continues to be labile, 1 to 2 L necessary even at rest to maintain sats greater than 90  Severe sepsis Hypotension BLE cellulitis - Continue ceftriaxone given nonpurulent erythema, cultures remain negative - Hypotension improved with IV fluids, remains borderline hypotensive but avoiding further IV fluids in the setting of heart failure exacerbation - Bilateral lower extremity edema improving, erythema resolving - Bilateral lower extremity edema may be the etiology of patient's erythema rather than overt infection. Given erythema continues to erupt with edema the timing is suspicious.  Will complete antibiotic course as above but discussed with family that recurrent erythema does not necessarily indicate infection.  AKI on CKD 3B - Baseline creatinine labile typically around 1.5 - Creatinine elevated to 2.7 at intake  - Worried patient's renal function may be advancing in the setting of above - Likely prerenal in the setting of hypotension questionable sepsis  - Improving with diuretics  Urinary retention, unspecified  -Patient has notable history of urinary retention when hospitalized, unclear etiology but likely positional -Foley placed 02/26/2023  -will attempt voiding trial prior to discharge, if unable to void successfully will replace Foley and have patient follow-up outpatient with urology(per son this is been necessary in the past)  A-fib, rate controlled -Status post cardioversion 1/28 -remains rate controlled -Continue amiodarone and apixaban  Constipation  -Increase MiraLAX and Senokot scheduled twice daily, follow for BM  Chronic anemia of chronic disease -Previously evaluated with normal iron and ferritin folate and B12 levels -Continue to monitor -baseline hemoglobin around 8   HTN, essential Hypotensive at intake in the  setting of presumed sepsis, resolved with IV fluids, hold antihypertensives   Resting  tremors -Nonspecific, continue to monitor   HLD -Continue statin   Anxiety - Continue as needed Xanax - given patient's age -would recommend decreasing doses/discontinuation of this medication in the near future   Neuropathic pain - Continue gabapentin   Generalized deconditioning - In the setting of cellulitis and dehydration - PT OT to follow  DVT prophylaxis: apixaban (ELIQUIS) tablet 2.5 mg Start: 02/23/23 2215 Place TED hose Start: 02/23/23 1830 apixaban (ELIQUIS) tablet 2.5 mg   Code Status:   Code Status: Limited: Do not attempt resuscitation (DNR) -DNR-LIMITED -Do Not Intubate/DNI   Family Communication: Updated over the phone  Status is: Inpatient  Dispo: The patient is from: [employer] d/c is to: Same              Anticipated d/c date is: 24 to 48 hours              Patient currently not medically stable for discharge  Consultants:  Cardiology  Procedures:  None  Antimicrobials:  Ceftriaxone  Subjective: No acute issues or events overnight -patient quite somnolent this morning, poorly arousable, following commands poorly.  No noted bowel movements over the past 48 hours  Objective: Vitals:   02/28/23 0400 02/28/23 0420 02/28/23 0500 02/28/23 0600  BP: (!) 158/64   (!) 143/62  Pulse: (!) 112   (!) 101  Resp: (!) 25   20  Temp:  97.9 F (36.6 C)    TempSrc:  Axillary    SpO2: 95%   97%  Weight:   73.6 kg   Height:        Intake/Output Summary (Last 24 hours) at 02/28/2023 0714 Last data filed at 02/28/2023 0600 Gross per 24 hour  Intake 280 ml  Output 2075 ml  Net -1795 ml   Filed Weights   02/26/23 0600 02/27/23 0500 02/28/23 0500  Weight: 76.8 kg 77.1 kg 73.6 kg    Examination:  General:  Pleasantly resting in bed, No acute distress.  Somnolent, poorly arousable HEENT:  Normocephalic atraumatic.  Sclerae nonicteric,  noninjected.  Extraocular movements intact bilaterally. Neck:  Without mass or deformity.  Trachea is midline. Lungs:  Clear to auscultate bilaterally without rhonchi, wheeze, or rales. Heart:  Regular rate and rhythm.  Without murmurs, rubs, or gallops. Abdomen:  Soft, nontender, nondistended.  Without guarding or rebound. Extremities: Bilateral lower extremity scant edema distal to the ankle, minimally tender to palpation Skin:  Warm and dry, no erythema(other than listed above).  Data Reviewed: I have personally reviewed following labs and imaging studies  CBC: Recent Labs  Lab 02/23/23 1539 02/24/23 0339 02/27/23 0746  WBC 13.5* 13.2* 17.7*  NEUTROABS 11.3*  --   --   HGB 7.3* 8.6* 7.4*  HCT 24.8* 31.1* 25.2*  MCV 93.6 97.5 94.0  PLT 269 289 286   Basic Metabolic Panel: Recent Labs  Lab 02/23/23 1539 02/24/23 0339 02/26/23 0307 02/27/23 0746 02/28/23 0253  NA 140 142 140 138 140  K 4.3 4.4 3.9 3.9 3.7  CL 104 108 106 105 104  CO2 26 22 25 24 27   GLUCOSE 94 108* 124* 95 95  BUN 56* 51* 53* 51* 47*  CREATININE 2.69* 2.65* 2.16* 1.93* 1.76*  CALCIUM 8.3* 8.2* 8.5* 8.5* 8.6*  MG  --  2.3  --   --   --   PHOS  --  5.0*  --   --   --  GFR: Estimated Creatinine Clearance: 21 mL/min (A) (by C-G formula based on SCr of 1.76 mg/dL (H)).  Liver Function Tests: Recent Labs  Lab 02/23/23 1539  AST 49*  ALT 35  ALKPHOS 76  BILITOT 0.5  PROT 5.2*  ALBUMIN 1.9*   Sepsis Labs: Recent Labs  Lab 02/23/23 1552  LATICACIDVEN 0.7   Recent Results (from the past 240 hours)  Culture, blood (routine x 2)     Status: None (Preliminary result)   Collection Time: 02/23/23  3:39 PM   Specimen: BLOOD LEFT HAND  Result Value Ref Range Status   Specimen Description   Final    BLOOD LEFT HAND Performed at [facility], [address] [place], [place] [address]    Special Requests   Final    BOTTLES DRAWN AEROBIC AND ANAEROBIC Blood Culture adequate  volume Performed at [facility], [address], [place], [place] [address]    Culture   Final    NO GROWTH 3 DAYS Performed at [facility], [address] [place], [place] [address]    Report Status PENDING  Incomplete  Culture, blood (routine x 2)     Status: None (Preliminary result)   Collection Time: 02/23/23  3:55 PM   Specimen: BLOOD RIGHT WRIST  Result Value Ref Range Status   Specimen Description   Final    BLOOD RIGHT WRIST Performed at [facility], [address] [place], [place] [address]    Special Requests   Final    BOTTLES DRAWN AEROBIC AND ANAEROBIC Blood Culture adequate volume Performed at [facility], [address] [place], [place] [address]    Culture   Final    NO GROWTH 3 DAYS Performed at [facility], [address] [place], [place] [address]    Report Status PENDING  Incomplete  Resp panel by RT-PCR (RSV, Flu A&B, Covid) Anterior Nasal Swab     Status: None   Collection Time: 02/23/23  6:56 PM   Specimen: Anterior Nasal Swab  Result Value Ref Range Status   SARS Coronavirus 2 by RT PCR NEGATIVE NEGATIVE Final    Comment: (NOTE) SARS-CoV-2 target nucleic acids are NOT DETECTED.  The SARS-CoV-2 RNA is generally detectable in upper respiratory specimens during the acute phase of infection. The lowest concentration of SARS-CoV-2 viral copies this assay can detect is 138 copies/mL. A negative result does not preclude SARS-Cov-2 infection and should not be used as the sole basis for treatment or other patient management decisions. A negative result may occur with  improper specimen collection/handling, submission of specimen other than nasopharyngeal swab, presence of viral mutation(s) within the areas targeted by this assay, and inadequate number of viral copies(<138 copies/mL). A negative result must be combined with clinical observations, patient history, and  epidemiological information. The expected result is Negative.  Fact Sheet for Patients:  BloggerCourse.com  Fact Sheet for Healthcare Providers:  SeriousBroker.it  This test is no t yet approved or cleared by the [place] FDA and  has been authorized for detection and/or diagnosis of SARS-CoV-2 by FDA under an Emergency Use Authorization (EUA). This EUA will remain  in effect (meaning this test can be used) for the duration of the COVID-19 declaration under Section 564(b)(1) of the Act, 21 U.S.C.section 360bbb-3(b)(1), unless the authorization is terminated  or revoked sooner.       Influenza A by PCR NEGATIVE NEGATIVE Final   Influenza B by  PCR NEGATIVE NEGATIVE Final    Comment: (NOTE) The Xpert Xpress SARS-CoV-2/FLU/RSV plus assay is intended as an aid in the diagnosis of influenza from Nasopharyngeal swab specimens and should not be used as a sole basis for treatment. Nasal washings and aspirates are unacceptable for Xpert Xpress SARS-CoV-2/FLU/RSV testing.  Fact Sheet for Patients: BloggerCourse.com  Fact Sheet for Healthcare Providers: SeriousBroker.it  This test is not yet approved or cleared by the [place] FDA and has been authorized for detection and/or diagnosis of SARS-CoV-2 by FDA under an Emergency Use Authorization (EUA). This EUA will remain in effect (meaning this test can be used) for the duration of the COVID-19 declaration under Section 564(b)(1) of the Act, 21 U.S.C. section 360bbb-3(b)(1), unless the authorization is terminated or revoked.     Resp Syncytial Virus by PCR NEGATIVE NEGATIVE Final    Comment: (NOTE) Fact Sheet for Patients: BloggerCourse.com  Fact Sheet for Healthcare Providers: SeriousBroker.it  This test is not yet approved or cleared by the [place] FDA and has been  authorized for detection and/or diagnosis of SARS-CoV-2 by FDA under an Emergency Use Authorization (EUA). This EUA will remain in effect (meaning this test can be used) for the duration of the COVID-19 declaration under Section 564(b)(1) of the Act, 21 U.S.C. section 360bbb-3(b)(1), unless the authorization is terminated or revoked.  Performed at [facility], [address] [place], [place] [address]   MRSA Next Gen by PCR, Nasal     Status: None   Collection Time: 02/24/23  1:17 AM   Specimen: Nasal Mucosa; Nasal Swab  Result Value Ref Range Status   MRSA by PCR Next Gen NOT DETECTED NOT DETECTED Final    Comment: (NOTE) The GeneXpert MRSA Assay (FDA approved for NASAL specimens only), is one component of a comprehensive MRSA colonization surveillance program. It is not intended to diagnose MRSA infection nor to guide or monitor treatment for MRSA infections. Test performance is not FDA approved in patients less than 45 years old. Performed at [facility], [address] [place], [place] [address]     Radiology Studies: ECHOCARDIOGRAM LIMITED Result Date: 02/26/2023    ECHOCARDIOGRAM LIMITED REPORT   Patient Name:   Brenda Matthews Date of Exam: 02/26/2023 Medical Rec #:  [id number]    Height:       61.0 in Accession #:    [id number]   Weight:       169.3 lb Date of Birth:  1935/04/02    BSA:          1.760 m Patient Age:    88 years     BP:           111/38 mmHg Patient Gender: F            HR:           70 bpm. Exam Location:  Inpatient Procedure: Limited Echo, Cardiac Doppler and Color Doppler Indications:    I50.40* Unspecified combined systolic (congestive) and diastolic                 (congestive) heart failure  History:        Patient has prior history of Echocardiogram examinations, most                 recent 01/03/2023. CHF, Abnormal ECG, Arrythmias:Atrial                 Fibrillation, Signs/Symptoms:Bacteremia, Edema, Shortness of  Breath and Dyspnea; Risk Factors:Dyslipidemia.  Sonographer:    Sheralyn Boatman RDCS Referring Phys: [phone] PHILIP J NAHSER  Sonographer Comments: Technically difficult study due to poor echo windows. Image acquisition challenging due to patient body habitus. Patient could not follow commands. No on- axis apical. Patient would not respond, RN notified. Limited study to evaluate volume status and ventricular function. IMPRESSIONS  1. Left ventricular ejection fraction, by estimation, is 60 to 65%. The left ventricle has normal function. The left ventricle has no regional wall motion abnormalities. There is mild concentric left ventricular hypertrophy. Left ventricular diastolic parameters are consistent with Grade I diastolic dysfunction (impaired relaxation). There is the interventricular septum is flattened in systole and diastole, consistent with right ventricular pressure and volume overload.  2. Right ventricular systolic function is mildly reduced. The right ventricular size is mildly enlarged. There is moderately elevated pulmonary artery systolic pressure. The estimated right ventricular systolic pressure is 48.2 mmHg.  3. Left atrial size was mildly dilated.  4. Right atrial size was mildly dilated.  5. Left pleural effusion present.  6. The mitral valve is grossly normal. Mild mitral valve regurgitation.  7. Tricuspid valve regurgitation is moderate.  8. The aortic valve is tricuspid. Aortic valve regurgitation is mild.  9. Aortic dilatation noted. There is moderate dilatation of the ascending aorta, measuring 44 mm. 10. The inferior vena cava is dilated in size with <50% respiratory variability, suggesting right atrial pressure of 15 mmHg. Comparison(s): Prior images reviewed side by side. LVEF normal range at 60-65%. Mild RV dysfunction with moderately elevated estimated RVSP. FINDINGS  Left Ventricle: Left ventricular ejection fraction, by estimation, is 60 to 65%. The left ventricle has normal function.  The left ventricle has no regional wall motion abnormalities. The left ventricular internal cavity size was normal in size. There is  mild concentric left ventricular hypertrophy. The interventricular septum is flattened in systole and diastole, consistent with right ventricular pressure and volume overload. Left ventricular diastolic parameters are consistent with Grade I diastolic dysfunction (impaired relaxation). Right Ventricle: The right ventricular size is mildly enlarged. No increase in right ventricular wall thickness. Right ventricular systolic function is mildly reduced. There is moderately elevated pulmonary artery systolic pressure. The tricuspid regurgitant velocity is 2.88 m/s, and with an assumed right atrial pressure of 15 mmHg, the estimated right ventricular systolic pressure is 48.2 mmHg. Left Atrium: Left atrial size was mildly dilated. Right Atrium: Right atrial size was mildly dilated. Pericardium: Left pleural effusion present. There is no evidence of pericardial effusion. Mitral Valve: The mitral valve is grossly normal. Mild mitral valve regurgitation. Tricuspid Valve: The tricuspid valve is normal in structure. Tricuspid valve regurgitation is moderate . No evidence of tricuspid stenosis. Aortic Valve: The aortic valve is tricuspid. Aortic valve regurgitation is mild. Pulmonic Valve: The pulmonic valve was not well visualized. Aorta: Aortic dilatation noted. There is moderate dilatation of the ascending aorta, measuring 44 mm. Venous: The inferior vena cava is dilated in size with less than 50% respiratory variability, suggesting right atrial pressure of 15 mmHg. IAS/Shunts: No atrial level shunt detected by color flow Doppler. Additional Comments: There is pleural effusion in the left lateral region. Spectral Doppler performed. Color Doppler performed.  LEFT VENTRICLE PLAX 2D LVIDd:         5.00 cm LVIDs:         3.10 cm LV PW:         1.10 cm LV IVS:        1.10  cm  LV Volumes (MOD) LV  vol d, MOD A2C: 62.0 ml LV vol d, MOD A4C: 106.0 ml LV vol s, MOD A2C: 20.0 ml LV vol s, MOD A4C: 35.6 ml LV SV MOD A2C:     42.0 ml LV SV MOD A4C:     106.0 ml LV SV MOD BP:      61.6 ml RIGHT VENTRICLE         IVC TAPSE (M-mode): 2.2 cm  IVC diam: 3.20 cm LEFT ATRIUM           Index        RIGHT ATRIUM           Index LA Vol (A2C): 15.2 ml 8.64 ml/m   RA Area:     23.30 cm LA Vol (A4C): 56.1 ml 31.88 ml/m  RA Volume:   68.80 ml  39.10 ml/m  AORTIC VALVE LVOT Vmax:   111.00 cm/s LVOT Vmean:  72.700 cm/s LVOT VTI:    0.215 m  AORTA Ao Asc diam: 4.35 cm MITRAL VALVE                TRICUSPID VALVE MV Area (PHT): 4.21 cm     TR Peak grad:   33.2 mmHg MV Decel Time: 180 msec     TR Vmax:        288.00 cm/s MV E velocity: 102.00 cm/s MV A velocity: 85.70 cm/s   SHUNTS MV E/A ratio:  1.19         Systemic VTI: 0.22 m Nona Dell MD Electronically signed by Nona Dell MD Signature Date/Time: 02/26/2023/3:21:46 PM    Final     Scheduled Meds:  sodium chloride   Intravenous Once   amiodarone  200 mg Oral BID   Followed by   Melene Muller ON 03/07/2023] amiodarone  200 mg Oral Daily   apixaban  2.5 mg Oral BID   atorvastatin  40 mg Oral Daily   calcium carbonate  1,250 mg Oral Daily   Chlorhexidine Gluconate Cloth  6 each Topical Daily   fluticasone  2 spray Each Nare Daily   furosemide  40 mg Intravenous BID   gabapentin  100 mg Oral BID   Continuous Infusions:  cefTRIAXone (ROCEPHIN)  IV Stopped (02/27/23 1828)    LOS: 5 days   Time spent:  Azucena Fallen, DO [facility]  If 7PM-7AM, please contact night-coverage [email or web address]  02/28/2023, 7:14 AM

## 2023-02-28 NOTE — Consult Note (Signed)
[facility] Liaison Consult Note   02/28/2023  DOMENICA WEIGHTMAN 1935-11-11 [id number]  Insurance: [employer] Medicare PPO   Primary Care Provider: Karna Dupes, MD is the provider listed in electronic medical record and currently not a provider in the [facility] network.    RN Hospital Liaison screened the patient remotely at [facility]. On-site rounding review. Patient is currently on the [facility] banner.    The patient was screened due to a 30 day readmission hospitalization with noted extreme high risk score for unplanned readmission risk with 4 hospital admissions in 6 months.  Plan: Patient for returning to SNF per notes however final disposition note completed.   [facility] does not replace or interfere with any arrangements made by the Inpatient Transition of Care team.   For questions contact:   Charlesetta Shanks, RN, BSN, CCM   [facility], [facility] Liaison Direct Dial: [phone] or secure chat Email: [email or web address]

## 2023-02-28 NOTE — Progress Notes (Signed)
Palliative-   Returned to bedside to evaluate patient and attempt goals of care discussion and advanced care planning with her.  She was less somnolent than this morning, but still had difficulty with discussion.  I approached discussion asking if she had any worries or hopes.  Without prompting she became anxious and cried "I don't want to die".  She noted her grandchildren's names and that she loves them. And she expressed gratitude to her daughter in law.  Attempted further discussion, but she retreated and did not participate in discussion- eventually becoming somnolent and speaking of baby chickens. 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  Spoke with [name] outside of room. I am concerned that Ameirah won't be able to make her own decision regarding hospice and plan of care moving forward. [name] agrees.  We discussed plan for optimizing patient during this stay and transitioning home with hospice. [name] is in agreement with that plan.   Ocie Bob, AGNP-C Palliative Medicine  Total time: 45 minutes

## 2023-02-28 NOTE — Plan of Care (Signed)

## 2023-02-28 NOTE — Plan of Care (Signed)
  Problem: Education: Goal: Knowledge of General Education information will improve Description: Including pain rating scale, medication(s)/side effects and non-pharmacologic comfort measures Outcome: Progressing   Problem: Health Behavior/Discharge Planning: Goal: Ability to manage health-related needs will improve Outcome: Progressing   Problem: Clinical Measurements: Goal: Ability to maintain clinical measurements within normal limits will improve Outcome: Progressing Goal: Will remain free from infection Outcome: Progressing Goal: Diagnostic test results will improve Outcome: Progressing Goal: Respiratory complications will improve Outcome: Progressing Goal: Cardiovascular complication will be avoided Outcome: Progressing   Problem: Activity: Goal: Risk for activity intolerance will decrease Outcome: Progressing   Problem: Nutrition: Goal: Adequate nutrition will be maintained Outcome: Progressing   Problem: Coping: Goal: Level of anxiety will decrease Outcome: Progressing   Problem: Elimination: Goal: Will not experience complications related to bowel motility Outcome: Progressing Goal: Will not experience complications related to urinary retention Outcome: Progressing   Problem: Pain Managment: Goal: General experience of comfort will improve and/or be controlled Outcome: Progressing   Problem: Safety: Goal: Ability to remain free from injury will improve Outcome: Progressing   Problem: Skin Integrity: Goal: Risk for impaired skin integrity will decrease Outcome: Progressing  Cindy S. Clelia Croft BSN, RN, [employer], CCRN 02/28/2023 3:09 AM

## 2023-02-28 NOTE — TOC Progression Note (Addendum)
Transition of Care [facility]) - Progression Note   Patient Details  Name: Brenda Matthews MRN: [id number] Date of Birth: [birth year]  Transition of [facility]) CM/SW Contact  Ewing Schlein, LCSW Phone Number: 02/28/2023, 10:41 AM  Clinical Narrative: CSW spoke with patient's daughter-in-law, Brenda Matthews, regarding PT's recommendation of patient returning to SNF. Per DIL, the family is considering SNF for rehab vs. ALF vs. LTC with hospice. Family will be coordinating with [facility] if patient's level of care needs to change if rehab is not pursued. Family to discuss patient's options. CSW updated Dru in admissions at [facility]. TOC to follow up.  Expected Discharge Plan: Skilled Nursing Facility Barriers to Discharge: Continued Medical Work up  Expected Discharge Plan and Services In-house Referral: Clinical Social Work Living arrangements for the past 2 months: Independent Living Facility  Social Determinants of Health (SDOH) Interventions SDOH Screenings   Food Insecurity: No Food Insecurity (02/23/2023)  Housing: Low Risk  (02/23/2023)  Transportation Needs: No Transportation Needs (02/23/2023)  Utilities: Not At Risk (02/23/2023)  Alcohol Screen: Low Risk  (10/11/2020)  Depression (PHQ2-9): Low Risk  (09/01/2022)  Financial Resource Strain: Low Risk  (10/27/2021)  Physical Activity: Sufficiently Active (10/27/2021)  Social Connections: Moderately Integrated (02/23/2023)  Stress: No Stress Concern Present (10/27/2021)  Tobacco Use: Low Risk  (02/23/2023)   Readmission Risk Interventions    02/24/2023   12:52 PM 02/06/2023    2:09 PM 10/24/2022    9:08 AM  Readmission Risk Prevention Plan  Post Dischage Appt   Complete  Medication Screening   Complete  Transportation Screening Complete Complete Complete  HRI or Home Care Consult  Complete   Social Work Consult for Recovery Care Planning/Counseling  Complete   Palliative Care Screening  Not Applicable   Medication Review Oceanographer)  Complete Complete   HRI or Home Care Consult Complete    SW Recovery Care/Counseling Consult Complete    Palliative Care Screening Not Applicable    Skilled Nursing Facility Complete

## 2023-02-28 NOTE — Progress Notes (Signed)
Messaged provider, family requesting dulcolax suppository for BM patient states she feel full and feels like she has to go but can't get it started, awaiting response

## 2023-02-28 NOTE — Progress Notes (Signed)
Progress Note  Patient Name: Brenda Matthews Date of Encounter: 02/28/2023  Primary Cardiologist: Chrystie Nose, MD   Subjective   Patient seen and examined at her bedside.  Her daughter-in-law was by the bedside.  Inpatient Medications    Scheduled Meds:  sodium chloride   Intravenous Once   amiodarone  200 mg Oral BID   Followed by   Melene Muller ON 03/07/2023] amiodarone  200 mg Oral Daily   apixaban  2.5 mg Oral BID   atorvastatin  40 mg Oral Daily   calcium carbonate  1,250 mg Oral Daily   Chlorhexidine Gluconate Cloth  6 each Topical Daily   fluticasone  2 spray Each Nare Daily   furosemide  40 mg Intravenous BID   gabapentin  100 mg Oral BID   Continuous Infusions:  cefTRIAXone (ROCEPHIN)  IV Stopped (02/27/23 1828)   PRN Meds: acetaminophen **OR** acetaminophen, albuterol, ondansetron **OR** ondansetron (ZOFRAN) IV, mouth rinse, senna-docusate   Vital Signs    Vitals:   02/28/23 0420 02/28/23 0500 02/28/23 0600 02/28/23 0900  BP:   (!) 143/62   Pulse:   (!) 101   Resp:   20   Temp: 97.9 F (36.6 C)   98.4 F (36.9 C)  TempSrc: Axillary   Oral  SpO2:   97%   Weight:  73.6 kg    Height:        Intake/Output Summary (Last 24 hours) at 02/28/2023 1114 Last data filed at 02/28/2023 0600 Gross per 24 hour  Intake 160 ml  Output 2075 ml  Net -1915 ml   Filed Weights   02/26/23 0600 02/27/23 0500 02/28/23 0500  Weight: 76.8 kg 77.1 kg 73.6 kg    Telemetry     - Personally Reviewed  ECG     - Personally Reviewed  Physical Exam     General: Comfortable Head: Atraumatic, normal size  Eyes: PEERLA, EOMI  Neck: Supple, normal JVD Cardiac: Normal S1, S2; RRR; no murmurs, rubs, or gallops Lungs: Clear to auscultation bilaterally Abd: Soft, nontender, no hepatomegaly  Ext: warm, no edema Musculoskeletal: No deformities, BUE and BLE strength normal and equal Skin: Warm and dry, no rashes   Neuro: Alert and oriented to person, place, time, and  situation, CNII-XII grossly intact, no focal deficits  Psych: Normal mood and affect   Labs    Chemistry Recent Labs  Lab 02/23/23 1539 02/24/23 0339 02/26/23 0307 02/27/23 0746 02/28/23 0253  NA 140   < > 140 138 140  K 4.3   < > 3.9 3.9 3.7  CL 104   < > 106 105 104  CO2 26   < > 25 24 27   GLUCOSE 94   < > 124* 95 95  BUN 56*   < > 53* 51* 47*  CREATININE 2.69*   < > 2.16* 1.93* 1.76*  CALCIUM 8.3*   < > 8.5* 8.5* 8.6*  PROT 5.2*  --   --   --   --   ALBUMIN 1.9*  --   --   --   --   AST 49*  --   --   --   --   ALT 35  --   --   --   --   ALKPHOS 76  --   --   --   --   BILITOT 0.5  --   --   --   --   GFRNONAA 17*   < > 22*  25* 28*  ANIONGAP 10   < > 9 9 9    < > = values in this interval not displayed.     Hematology Recent Labs  Lab 02/23/23 1539 02/24/23 0339 02/27/23 0746  WBC 13.5* 13.2* 17.7*  RBC 2.65* 3.19* 2.68*  HGB 7.3* 8.6* 7.4*  HCT 24.8* 31.1* 25.2*  MCV 93.6 97.5 94.0  MCH 27.5 27.0 27.6  MCHC 29.4* 27.7* 29.4*  RDW 16.1* 16.3* 16.3*  PLT 269 289 286    Cardiac EnzymesNo results for input(s): "TROPONINI" in the last 168 hours. No results for input(s): "TROPIPOC" in the last 168 hours.   BNP Recent Labs  Lab 02/23/23 1539  BNP 650.2*     DDimer No results for input(s): "DDIMER" in the last 168 hours.   Radiology    No results found.   Cardiac Studies     Patient Profile     88 y.o. female with a history of chronic diastolic congestive heart failure, atrial fibrillation with RVR, acute on chronic renal disease.   Assessment & Plan    Acute on chronic diastolic heart failure with echo suggestion of RV volume overloaded-she needs to be diuresed.  Creatinine slightly improving.  Net negative -1915 cc.  Will continue to monitor this closely.  I reviewed her repeat echocardiogram which still does have evidence of volume overload, noted pulmonary hypertension, moderate tricuspid regurgitation.   It would be beneficial to keep  her on the Lasix IV for now.-Can output.  Breathing is improving.  We can transition the patient tomorrow to torsemide 20 mg which will be used at the time of discharge.  Unclear what her dry weight is therefore it will be beneficial when she gets to the nursing facility and makes a follow-up we can be able to optimize her doses/daily doses of diuretics based on her dry weight.  Paroxysmal atrial fibrillation-in sinus rhythm continue current amiodarone as well as Eliquis.      For questions or updates, please contact [facility] Please consult [email or web address] for contact info under Cardiology/STEMI.      Signed, Dominga Mcduffie, DO  02/28/2023, 11:14 AM

## 2023-02-28 NOTE — Progress Notes (Addendum)
Daily Progress Note   Patient Name: Brenda Matthews       Date: 02/28/2023 DOB: 1935/07/26  Age: 88 y.o. MRN#: [id number] Attending Physician: Azucena Fallen, MD Primary Care Physician: Karna Dupes, MD Admit Date: 02/23/2023  Reason for Consultation/Follow-up: Establishing goals of care  Patient Profile/HPI:  88 y.o. female  with past medical history of chronic anemia, chronic leg edema, CKD 3b, DM2, HTN, HLD, afib on Eliquis, anxiety, chronic hear failure, multiple recent admissions for heart failure,  admitted on 02/23/2023 with lower extremity cellulitis and heart failure exacerbation with acute on chronic kidney injury. Palliative medicine consulted for goals of care.   Subjective: Chart reviewed including labs, progress notes, imaging from this and previous encounters. Renal function slightly improved today.  Daughter in Pharmacologist at bedside. Brenda Matthews feels plan is trending toward hospice- but she is hoping Brenda Matthews's mental status will improve to where she can participate in further discussion.  Brenda Matthews notes that she did have some moments of clarity last night and Brenda Matthews asked Brenda Matthews if she was dying. Brenda Matthews has had some ongoing pain and anxiety. We discussed decreasing frequency and dose of xanax. Starting a daily antidepressant. Starting scheduled tylenol.  Per RN this morning she was crying and inconsolable. Brenda Matthews notes she has also been a little agitated at times with movement and therapies. I attempted to assess mental status and ability to participate in discussion. Brenda Matthews was lethargic. She was able to identify Brenda Matthews, and say she was in the hospital- but otherwise did not answer my questions. She was not able to participate in discussion.    Review of Systems  Unable to perform ROS:  Mental status change     Physical Exam Vitals and nursing note reviewed.  Constitutional:      Appearance: She is ill-appearing.  Cardiovascular:     Rate and Rhythm: Normal rate.  Pulmonary:     Effort: Pulmonary effort is normal.  Skin:    Coloration: Skin is pale.             Vital Signs: BP (!) 143/62   Pulse (!) 101   Temp 98.4 F (36.9 C) (Oral)   Resp 20   Ht 5\' 1"  (1.549 m)   Wt 73.6 kg Comment: confirmed - weight obtained on ICU bed  LMP  (  LMP Unknown)   SpO2 97%   BMI 30.66 kg/m  SpO2: SpO2: 97 % O2 Device: O2 Device: Nasal Cannula O2 Flow Rate: O2 Flow Rate (L/min): 1 L/min  Intake/output summary:  Intake/Output Summary (Last 24 hours) at 02/28/2023 1136 Last data filed at 02/28/2023 0600 Gross per 24 hour  Intake 160 ml  Output 2075 ml  Net -1915 ml   LBM: Last BM Date : 02/26/23 Baseline Weight: Weight: 77.2 kg Most recent weight: Weight: 73.6 kg (confirmed - weight obtained on ICU bed)       Palliative Assessment/Data: PPS: 20%      Patient Active Problem List   Diagnosis Date Noted   Bilateral lower leg cellulitis 02/24/2023   Severe sepsis (HCC) 02/23/2023   Acute diastolic (congestive) heart failure (HCC) 02/04/2023   Depression 01/11/2023   Cellulitis of right leg 01/09/2023   Hypokalemia 01/09/2023   Constipation 01/09/2023   Acute pulmonary edema (HCC) 01/03/2023   Acute respiratory failure with hypoxemia (HCC) 01/02/2023   High risk medication use 11/07/2022   Acute bilateral low back pain without sciatica 11/07/2022   ARF (acute renal failure) (HCC) 10/23/2022   Acute renal failure superimposed on stage 3b chronic kidney disease (HCC) 10/22/2022   Chronic heart failure with preserved ejection fraction (HFpEF) (HCC) 10/22/2022   Elevated transaminase level 10/22/2022   Physical deconditioning 06/14/2022   Acute on chronic diastolic heart failure (HCC) 06/11/2022   Idiopathic acute pancreatitis without necrosis or infection  09/14/2020   Pancreatic cyst 09/14/2020   Pancreatic duct dilated 09/14/2020   Pure hypercholesterolemia 09/14/2020   Hypertensive heart and chronic kidney disease with heart failure and stage 1 through stage 4 chronic kidney disease, or unspecified chronic kidney disease (HCC) 09/28/2018   Spinal stenosis, lumbar region with neurogenic claudication 09/26/2018   Osteoarthritis of right knee 11/21/2017   Acute on chronic diastolic CHF (congestive heart failure) (HCC)    S/P thoracentesis    Acute respiratory failure (HCC)    Rash in adult 08/30/2017   Atrial fibrillation and flutter (HCC)    DOE (dyspnea on exertion) 08/29/2017   Arterial hypotension    Chronic anticoagulation 12/19/2016   AKI (acute kidney injury) (HCC)    Low TSH level 02/19/2016   PAF (paroxysmal atrial fibrillation) (HCC) 02/18/2016   Ankle fracture, left-s/p surgery 02/18/16 02/18/2016   Closed displaced trimalleolar fracture of left ankle 02/18/2016   Acute bronchitis 02/10/2015   Bronchopneumonia 12/02/2013   Chronic kidney disease, stage 3b (HCC) 12/04/2012   Hx of adenomatous colonic polyps    Routine general medical examination at a health care facility 09/05/2011   Cervical cancer screening 02/24/2011   SCIATICA, BILATERAL 09/01/2009   Sprain of sacroiliac region 09/01/2009   ANEMIA 11/07/2007   Osteopenia of the elderly 11/07/2007   INSOMNIA 11/07/2007   EDEMA 08/14/2007   Hyperlipidemia 04/04/2007   PANIC DISORDER 04/04/2007   Essential hypertension 04/04/2007   Anxiety state 12/27/2006   Arthropathy 12/27/2006    Palliative Care Assessment & Plan    Assessment/Recommendations/Plan  End stage heart failure- continue diuresis Anxiety/Depression- decrease xanax to 0.25mg  BID PRN to try and improve pt mental status, start cymbalta 20mg  po daily for depression and pain Acetaminophen 650mg  po TID for pain   Code Status:   Code Status: Limited: Do not attempt resuscitation (DNR) -DNR-LIMITED -Do  Not Intubate/DNI    Prognosis:  Unable to determine  Discharge Planning: To Be Determined  Care plan was discussed with patient's daughter in law Brenda Matthews.  Thank you for allowing the Palliative Medicine Team to assist in the care of this patient.  Total time:  60 minutes Prolonged billing:  Time includes:   Preparing to see the patient (e.g., review of tests) Obtaining and/or reviewing separately obtained history Performing a medically necessary appropriate examination and/or evaluation Counseling and educating the patient/family/caregiver Ordering medications, tests, or procedures Referring and communicating with other health care professionals (when not reported separately) Documenting clinical information in the electronic or other health record Independently interpreting results (not reported separately) and communicating results to the patient/family/caregiver Care coordination (not reported separately) Clinical documentation  Ocie Bob, AGNP-C Palliative Medicine   Please contact Palliative Medicine Team phone at [phone] for questions and concerns.

## 2023-03-01 DIAGNOSIS — I5033 Acute on chronic diastolic (congestive) heart failure: Secondary | ICD-10-CM | POA: Diagnosis not present

## 2023-03-01 DIAGNOSIS — I371 Nonrheumatic pulmonary valve insufficiency: Secondary | ICD-10-CM | POA: Diagnosis not present

## 2023-03-01 DIAGNOSIS — I480 Paroxysmal atrial fibrillation: Secondary | ICD-10-CM

## 2023-03-01 DIAGNOSIS — A419 Sepsis, unspecified organism: Secondary | ICD-10-CM | POA: Diagnosis not present

## 2023-03-01 DIAGNOSIS — I519 Heart disease, unspecified: Secondary | ICD-10-CM | POA: Diagnosis not present

## 2023-03-01 DIAGNOSIS — Z515 Encounter for palliative care: Secondary | ICD-10-CM | POA: Diagnosis not present

## 2023-03-01 DIAGNOSIS — R6520 Severe sepsis without septic shock: Secondary | ICD-10-CM | POA: Diagnosis not present

## 2023-03-01 DIAGNOSIS — L03116 Cellulitis of left lower limb: Secondary | ICD-10-CM | POA: Diagnosis not present

## 2023-03-01 DIAGNOSIS — I2721 Secondary pulmonary arterial hypertension: Secondary | ICD-10-CM | POA: Diagnosis not present

## 2023-03-01 LAB — BASIC METABOLIC PANEL
Anion gap: 10 (ref 5–15)
BUN: 45 mg/dL — ABNORMAL HIGH (ref 8–23)
CO2: 26 mmol/L (ref 22–32)
Calcium: 9 mg/dL (ref 8.9–10.3)
Chloride: 107 mmol/L (ref 98–111)
Creatinine, Ser: 1.77 mg/dL — ABNORMAL HIGH (ref 0.44–1.00)
GFR, Estimated: 28 mL/min — ABNORMAL LOW (ref >60)
Glucose, Bld: 94 mg/dL (ref 70–99)
Potassium: 3.7 mmol/L (ref 3.5–5.1)
Sodium: 143 mmol/L (ref 135–145)

## 2023-03-01 MED ORDER — HALOPERIDOL LACTATE 2 MG/ML PO CONC
0.5000 mg | ORAL | Status: DC | PRN
  Filled 2023-03-01: qty 5

## 2023-03-01 MED ORDER — GLYCOPYRROLATE 0.2 MG/ML IJ SOLN
0.2000 mg | INTRAMUSCULAR | Status: DC | PRN
  Administered 2023-03-02: 0.2 mg via INTRAVENOUS
  Filled 2023-03-01: qty 1

## 2023-03-01 MED ORDER — GLYCOPYRROLATE 0.2 MG/ML IJ SOLN: 0.2000 mg | INTRAMUSCULAR | Status: DC | PRN

## 2023-03-01 MED ORDER — GLYCOPYRROLATE 1 MG PO TABS: 1.0000 mg | ORAL_TABLET | ORAL | Status: DC | PRN

## 2023-03-01 MED ORDER — ORAL CARE MOUTH RINSE: 15.0000 mL | OROMUCOSAL | Status: DC | PRN

## 2023-03-01 MED ORDER — OXYCODONE HCL 20 MG/ML PO CONC
5.0000 mg | ORAL | Status: DC | PRN
  Administered 2023-03-01 – 2023-03-02 (×2): 5 mg via SUBLINGUAL
  Filled 2023-03-01: qty 0.5

## 2023-03-01 MED ORDER — HALOPERIDOL LACTATE 5 MG/ML IJ SOLN: 0.5000 mg | INTRAMUSCULAR | Status: DC | PRN

## 2023-03-01 MED ORDER — SENNOSIDES-DOCUSATE SODIUM 8.6-50 MG PO TABS
2.0000 | ORAL_TABLET | Freq: Two times a day (BID) | ORAL | Status: DC
  Administered 2023-03-01 – 2023-03-02 (×2): 2 via ORAL
  Filled 2023-03-01 (×2): qty 2

## 2023-03-01 MED ORDER — POLYVINYL ALCOHOL 1.4 % OP SOLN: 1.0000 [drp] | Freq: Four times a day (QID) | OPHTHALMIC | Status: DC | PRN

## 2023-03-01 MED ORDER — HALOPERIDOL 0.5 MG PO TABS: 0.5000 mg | ORAL_TABLET | ORAL | Status: DC | PRN

## 2023-03-01 MED ORDER — OXYCODONE HCL 20 MG/ML PO CONC
5.0000 mg | ORAL | Status: DC | PRN
  Administered 2023-03-01 – 2023-03-02 (×2): 5 mg via ORAL
  Filled 2023-03-01 (×3): qty 0.5

## 2023-03-01 MED ORDER — TORSEMIDE 20 MG PO TABS
20.0000 mg | ORAL_TABLET | Freq: Every day | ORAL | Status: DC
  Administered 2023-03-02: 20 mg via ORAL
  Filled 2023-03-01: qty 1

## 2023-03-01 NOTE — Progress Notes (Signed)
Palliative-   HCPOA document received via email from patient's daughter in law. Forwarded to [email or web address] to be scanned into medical record.   Ocie Bob, AGNP-C Palliative Medicine  No charge

## 2023-03-01 NOTE — Plan of Care (Signed)

## 2023-03-01 NOTE — TOC Progression Note (Signed)
Transition of Care [facility]) - Progression Note    Patient Details  Name: Brenda Matthews MRN: [id number] Date of Birth: 1935-05-30  Transition of Care [facility]) CM/SW Contact  Beckie Busing, RN Phone [phone]  03/01/2023, 1:48 PM  Clinical Narrative:    Cm has reached out to Dru with admissions at [facility] to determine if patient is able to return. There is no answer. Voicemail has been left.    Expected Discharge Plan: Skilled Nursing Facility Barriers to Discharge: Continued Medical Work up  Expected Discharge Plan and Services In-house Referral: Clinical Social Work     Living arrangements for the past 2 months: Independent Living Facility                                       Social Determinants of Health (SDOH) Interventions SDOH Screenings   Food Insecurity: No Food Insecurity (02/23/2023)  Housing: Low Risk  (02/23/2023)  Transportation Needs: No Transportation Needs (02/23/2023)  Utilities: Not At Risk (02/23/2023)  Alcohol Screen: Low Risk  (10/11/2020)  Depression (PHQ2-9): Low Risk  (09/01/2022)  Financial Resource Strain: Low Risk  (10/27/2021)  Physical Activity: Sufficiently Active (10/27/2021)  Social Connections: Moderately Integrated (02/23/2023)  Stress: No Stress Concern Present (10/27/2021)  Tobacco Use: Low Risk  (02/28/2023)    Readmission Risk Interventions    02/24/2023   12:52 PM 02/06/2023    2:09 PM 10/24/2022    9:08 AM  Readmission Risk Prevention Plan  Post Dischage Appt   Complete  Medication Screening   Complete  Transportation Screening Complete Complete Complete  HRI or Home Care Consult  Complete   Social Work Consult for Recovery Care Planning/Counseling  Complete   Palliative Care Screening  Not Applicable   Medication Review Oceanographer) Complete Complete   HRI or Home Care Consult Complete    SW Recovery Care/Counseling Consult Complete    Palliative Care Screening Not Applicable    Skilled Nursing Facility Complete

## 2023-03-01 NOTE — Progress Notes (Signed)
Messaged provider cardaic monitoring has expired fro this patient. do you wan to continue?   Awaiting response

## 2023-03-01 NOTE — Plan of Care (Signed)
  Problem: Clinical Measurements: Goal: Respiratory complications will improve Outcome: Progressing Goal: Cardiovascular complication will be avoided Outcome: Progressing   Problem: Nutrition: Goal: Adequate nutrition will be maintained Outcome: Progressing   Problem: Coping: Goal: Level of anxiety will decrease Outcome: Progressing   Problem: Pain Managment: Goal: General experience of comfort will improve and/or be controlled Outcome: Progressing

## 2023-03-01 NOTE — Progress Notes (Signed)
[facility]  PROGRESS NOTE  Brenda Matthews ZOX:[id number] DOB: January 08, 1936 DOA: 02/23/2023 PCP: Karna Dupes, MD   Brief HPI:    88 y.o. female with medical history significant for chronic anemia, chronic leg edema, CKD 3B, non-insulin-dependent diabetes, hypertension, hyperlipidemia, atrial fibrillation on Eliquis, anxiety, DDD, and chronic diastolic heart failure who presented from her SNF admission of fever, leg swelling and redness. (Recently hospitalized 12/16 and 1/18 for CHF exacerbation/Afib RVR). Hospitalist called for admission.     Assessment/Plan:      Acute hypoxic respiratory failure, multifactorial, improving Rule out heart failure exacerbation/volume overload, HFpEF Pleural effusion, right -Cardiology was consulted, patient does have pulmonary hypertension with moderate tricuspid regurgitation -Received IV Lasix, switch to torsemide 20 mg daily -Cardio GI signed off    Severe sepsis Hypotension BLE cellulitis - Continue ceftriaxone given nonpurulent erythema, cultures remain negative - Hypotension improved with IV fluids, remains borderline hypotensive but avoiding further IV fluids in the setting of heart failure exacerbation - Bilateral lower extremity edema improving, erythema resolving omplete antibiotic course as above but discussed with family that recurrent erythema does not necessarily indicate infection.   AKI on CKD 3B - Baseline creatinine labile typically around 1.5 - Creatinine has improved to 1.77    Urinary retention, unspecified  -Patient has notable history of urinary retention when hospitalized, unclear etiology but likely positional -Foley placed 02/26/2023  -will attempt voiding trial prior to discharge, if unable to void successfully will replace Foley and have patient follow-up outpatient with urology(per son this is been necessary in the past)   A-fib, rate controlled -Status post cardioversion 1/28 -remains rate controlled -Continue  amiodarone and apixaban   Constipation  -Increase MiraLAX and Senokot scheduled twice daily, follow for BM   Chronic anemia of chronic disease -Previously evaluated with normal iron and ferritin folate and B12 levels -Continue to monitor -baseline hemoglobin around 8   HTN, essential Hypotensive at intake in the setting of presumed sepsis, resolved with IV fluids, hold antihypertensives   Resting tremors -Nonspecific, continue to monitor   HLD -Continue statin   Anxiety - Continue as needed Xanax - given patient's age -would recommend decreasing doses/discontinuation of this medication in the near future   Neuropathic pain - Continue gabapentin   Generalized deconditioning - In the setting of cellulitis and dehydration - PT OT to follow  Goals of care -Dr. Natale Milch had lengthy discussion with family daily -Discussed at length with patient 2/10 - while she is averse to the words "palliative" and "hospice" we discussed there are treatments that she would not want (including things like surgery) and that moving forward with palliative is just deciding what she does and does not want to including her health care. -Appreciate palliative care having discussion with family and patient about goals of care moving forward as well as will likely need palliative care follow up in the outpatient setting -Patient remains DNR per discussion with son, healthcare power of attorney, given previous discussion with patient concurrent with her wishes. -Today patient was transition to comfort care measures as per palliative.  Palliative working to figure out best disposition option for patient.  Patient resides at skilled nursing facility, [facility].  Medications     acetaminophen  650 mg Oral TID   amiodarone  200 mg Oral BID   Followed by   Melene Muller ON 03/07/2023] amiodarone  200 mg Oral Daily   apixaban  2.5 mg Oral BID   calcium carbonate  1,250 mg Oral Daily  Chlorhexidine Gluconate  Cloth  6 each Topical Q2200   DULoxetine  20 mg Oral Daily   fluticasone  2 spray Each Nare Daily   gabapentin  100 mg Oral BID   polyethylene glycol  17 g Oral BID   senna-docusate  2 tablet Oral BID   [START ON 03/02/2023] torsemide  20 mg Oral Daily     Data Reviewed:   CBG:  No results for input(s): "GLUCAP" in the last 168 hours.  SpO2: 99 % O2 Flow Rate (L/min): 1 L/min FiO2 (%): 21 %    Vitals:   03/01/23 0800 03/01/23 0825 03/01/23 0900 03/01/23 1000  BP:      Pulse:      Resp: (!) 28 (!) 28 (!) 31 (!) 28  Temp:      TempSrc:      SpO2:      Weight:      Height:          Data Reviewed:  Basic Metabolic Panel: Recent Labs  Lab 02/24/23 0339 02/26/23 0307 02/27/23 0746 02/28/23 0253 03/01/23 0717  NA 142 140 138 140 143  K 4.4 3.9 3.9 3.7 3.7  CL 108 106 105 104 107  CO2 22 25 24 27 26   GLUCOSE 108* 124* 95 95 94  BUN 51* 53* 51* 47* 45*  CREATININE 2.65* 2.16* 1.93* 1.76* 1.77*  CALCIUM 8.2* 8.5* 8.5* 8.6* 9.0  MG 2.3  --   --   --   --   PHOS 5.0*  --   --   --   --     CBC: Recent Labs  Lab 02/23/23 1539 02/24/23 0339 02/27/23 0746  WBC 13.5* 13.2* 17.7*  NEUTROABS 11.3*  --   --   HGB 7.3* 8.6* 7.4*  HCT 24.8* 31.1* 25.2*  MCV 93.6 97.5 94.0  PLT 269 289 286    LFT Recent Labs  Lab 02/23/23 1539  AST 49*  ALT 35  ALKPHOS 76  BILITOT 0.5  PROT 5.2*  ALBUMIN 1.9*     Antibiotics: Anti-infectives (From admission, onward)    Start     Dose/Rate Route Frequency Ordered Stop   02/24/23 1645  cefTRIAXone (ROCEPHIN) 2 g in sodium chloride 0.9 % 100 mL IVPB  Status:  Discontinued        2 g 200 mL/hr over 30 Minutes Intravenous Every 24 hours 02/24/23 0058 03/01/23 1125   02/23/23 1645  cefTRIAXone (ROCEPHIN) 2 g in sodium chloride 0.9 % 100 mL IVPB        2 g 200 mL/hr over 30 Minutes Intravenous  Once 02/23/23 1639 02/23/23 1820        DVT prophylaxis: Eliquis  Code Status: DNR  Family Communication:    CONSULTS  palliative care, cardiology   Subjective   Denies any complaints   Objective    Physical Examination:   General-appears in no acute distress Heart-S1-S2, regular, no murmur auscultated Lungs-clear to auscultation bilaterally, no wheezing or crackles auscultated Abdomen-soft, nontender, no organomegaly Extremities-no edema in the lower extremities Neuro-alert, oriented x3, no focal deficit noted  Status is: Inpatient:             Meredeth Ide   [facility] If 7PM-7AM, please contact night-coverage at [email or web address], Office  [phone]   03/01/2023, 11:54 AM  LOS: 6 days

## 2023-03-01 NOTE — Progress Notes (Addendum)
Palliative-   Email received from [facility] requesting further discussion. Went to patient's bedside- patient resting comfortably. Met with Noami outside patient's room. She and family are struggling with figuring out which level of care patient should go back to [facility] with (back to her room in rehab or to the skilled nursing unit). Emotional support provided.  Recommended re-evaluating patient in morning for stability to transfer. Will have case manager reach out to [facility] tomorrow to further discuss disposition if patient appears stable enough for discharge. If she is not stable for discharge then will keep inpatient for end of life care.   Ocie Bob, AGNP-C Palliative Medicine  No charge

## 2023-03-01 NOTE — Progress Notes (Signed)
PT Cancellation Note  Patient Details Name: Brenda Matthews MRN: [id number] DOB: 09-19-1935   Cancelled Treatment:    Reason Eval/Treat Not Completed: Other (comment) Pt now comfort care and PT orders cancelled.  Will sign off. Anise Salvo, PT Acute Rehab [facility] [phone]   Brenda Matthews 03/01/2023, 12:47 PM

## 2023-03-01 NOTE — Progress Notes (Signed)
Progress Note  Patient Name: Brenda Matthews Date of Encounter: 03/01/2023  Primary Cardiologist: Chrystie Nose, MD   Subjective   Patient seen and examined at her bedside.  Her daughter-in-law was by the bedside.  Inpatient Medications    Scheduled Meds:  sodium chloride   Intravenous Once   acetaminophen  650 mg Oral TID   amiodarone  200 mg Oral BID   Followed by   Melene Muller ON 03/07/2023] amiodarone  200 mg Oral Daily   apixaban  2.5 mg Oral BID   atorvastatin  40 mg Oral Daily   calcium carbonate  1,250 mg Oral Daily   Chlorhexidine Gluconate Cloth  6 each Topical Q2200   DULoxetine  20 mg Oral Daily   fluticasone  2 spray Each Nare Daily   furosemide  40 mg Intravenous BID   gabapentin  100 mg Oral BID   polyethylene glycol  17 g Oral BID   senna-docusate  1 tablet Oral BID   Continuous Infusions:  cefTRIAXone (ROCEPHIN)  IV 2 g (02/28/23 2115)   PRN Meds: acetaminophen **OR** acetaminophen, albuterol, ALPRAZolam, ondansetron **OR** ondansetron (ZOFRAN) IV, mouth rinse   Vital Signs    Vitals:   03/01/23 0600 03/01/23 0700 03/01/23 0800 03/01/23 0825  BP:      Pulse:      Resp: (!) 24 (!) 30 (!) 28 (!) 28  Temp:      TempSrc:      SpO2:      Weight:      Height:        Intake/Output Summary (Last 24 hours) at 03/01/2023 1032 Last data filed at 03/01/2023 0825 Gross per 24 hour  Intake --  Output 775 ml  Net -775 ml   Filed Weights   02/28/23 0500 02/28/23 1400 03/01/23 0440  Weight: 73.6 kg 74.6 kg 76.4 kg    Telemetry     - Personally Reviewed  ECG     - Personally Reviewed  Physical Exam     General: Comfortable Head: Atraumatic, normal size  Eyes: PEERLA, EOMI  Neck: Supple, normal JVD Cardiac: Normal S1, S2; RRR; no murmurs, rubs, or gallops Lungs: Clear to auscultation bilaterally Abd: Soft, nontender, no hepatomegaly  Ext: warm, no edema Musculoskeletal: No deformities, BUE and BLE strength normal and equal Skin: Warm and dry,  no rashes   Neuro: Alert and oriented to person, place, time, and situation, CNII-XII grossly intact, no focal deficits  Psych: Normal mood and affect   Labs    Chemistry Recent Labs  Lab 02/23/23 1539 02/24/23 0339 02/27/23 0746 02/28/23 0253 03/01/23 0717  NA 140   < > 138 140 143  K 4.3   < > 3.9 3.7 3.7  CL 104   < > 105 104 107  CO2 26   < > 24 27 26   GLUCOSE 94   < > 95 95 94  BUN 56*   < > 51* 47* 45*  CREATININE 2.69*   < > 1.93* 1.76* 1.77*  CALCIUM 8.3*   < > 8.5* 8.6* 9.0  PROT 5.2*  --   --   --   --   ALBUMIN 1.9*  --   --   --   --   AST 49*  --   --   --   --   ALT 35  --   --   --   --   ALKPHOS 76  --   --   --   --  BILITOT 0.5  --   --   --   --   GFRNONAA 17*   < > 25* 28* 28*  ANIONGAP 10   < > 9 9 10    < > = values in this interval not displayed.     Hematology Recent Labs  Lab 02/23/23 1539 02/24/23 0339 02/27/23 0746  WBC 13.5* 13.2* 17.7*  RBC 2.65* 3.19* 2.68*  HGB 7.3* 8.6* 7.4*  HCT 24.8* 31.1* 25.2*  MCV 93.6 97.5 94.0  MCH 27.5 27.0 27.6  MCHC 29.4* 27.7* 29.4*  RDW 16.1* 16.3* 16.3*  PLT 269 289 286    Cardiac EnzymesNo results for input(s): "TROPONINI" in the last 168 hours. No results for input(s): "TROPIPOC" in the last 168 hours.   BNP Recent Labs  Lab 02/23/23 1539  BNP 650.2*     DDimer No results for input(s): "DDIMER" in the last 168 hours.   Radiology    No results found.   Cardiac Studies     Patient Profile     88 y.o. female with a history of chronic diastolic congestive heart failure, atrial fibrillation with RVR, acute on chronic renal disease.   Assessment & Plan    Creatinine slightly improving.  Net negative -775cc.  Will continue to monitor this closely.  I reviewed her repeat echocardiogram which still does have evidence of volume overload, noted pulmonary hypertension, moderate tricuspid regurgitation.   Will stop IV Lasix today.  Will transition the patient to torsemide 20 mg daily.   Unclear what her dry weight is therefore it will be beneficial when she gets to the nursing facility and makes a follow-up we can be able to optimize her doses/daily doses of diuretics based on her dry weight.  Paroxysmal atrial fibrillation-in sinus rhythm continue current amiodarone as well as Eliquis.  Will sign off at this time  For questions or updates, please contact [facility] Please consult [email or web address] for contact info under Cardiology/STEMI.      Signed, Thomasene Ripple, DO  03/01/2023, 10:32 AM

## 2023-03-01 NOTE — Progress Notes (Signed)
Patient transferred to palliative care this am, educated DIL about medications and expectations during decline, will need further education, also need to discuss with CM what the family wishes., I.e. go back to current facility with Hospice visits and family to provide care giving ATC,patient  still experiencing difficulty passing stool, assisted this shift via RN and was able to help her move LG black dry balls, educated DIL that her bowels may be sluggish from narcotics received in ICU, but the significant amount of stool took a long time to accumulate and enemas are contraindicated with CKD but we will continue regimen and it will inevitably come out, patient was given bath and turned q 2 as well as needs anticipated due to her cognitive decline, in bed resting at this time, call light in reach

## 2023-03-01 NOTE — Progress Notes (Signed)
Daily Progress Note   Patient Name: Brenda Matthews       Date: 03/01/2023 DOB: 12/12/1935  Age: 88 y.o. MRN#: [id number] Attending Physician: Meredeth Ide, MD Primary Care Physician: Karna Dupes, MD Admit Date: 02/23/2023  Reason for Consultation/Follow-up: Establishing goals of care  Patient Profile/HPI:  88 y.o. female  with past medical history of chronic anemia, chronic leg edema, CKD 3b, DM2, HTN, HLD, afib on Eliquis, anxiety, chronic hear failure, multiple recent admissions for heart failure,  admitted on 02/23/2023 with lower extremity cellulitis and heart failure exacerbation with acute on chronic kidney injury. Palliative medicine consulted for goals of care.   Subjective: Chart reviewed including labs, progress notes, imaging from this and previous encounters.  Isaiah is more awake, but still lethargic. Audible wheezing. Does not appear improved. Answers questions slowly. Appears breathless. Per RN she is sleeping most of the time. Eating and drinking very little.  Cardiology transitioning to po diuresis today.  Naomi at bedside.  Discussed with [name] that patient does not appear greatly improved. I am concerned she is at end of life.  Advanced Care Planning [name] notes patient had good discussion with her and her Chaplain. Carolie expressed that she doesn't want to die in the hospital.  I feel there is a small window of stability to transfer so that she doesn't die in the hospital.  Discussed transition to comfort measures. Discussed transition to comfort measures only which includes stopping IV fluids, antibiotics, labs and providing symptom management for SOB, anxiety, nausea, vomiting, and other symptoms of dying.  Recommended transferring back to [facility] with hospice.  Encouraged [name] to call family members.  Naomi in agreement with plan.    Review of Systems  Unable to perform ROS: Mental status change     Physical Exam Vitals and nursing note reviewed.  Constitutional:      Appearance: She is ill-appearing.  Cardiovascular:     Rate and Rhythm: Normal rate.  Pulmonary:     Comments: Increased effort and rate Skin:    Coloration: Skin is pale.             Vital Signs: BP 115/85   Pulse 84   Temp 98 F (36.7 C)   Resp (!) 28   Ht 5' 0.98" (1.549 m)   Wt  76.4 kg   LMP  (LMP Unknown)   SpO2 99%   BMI 31.84 kg/m  SpO2: SpO2: 99 % O2 Device: O2 Device: Room Air O2 Flow Rate: O2 Flow Rate (L/min): 1 L/min  Intake/output summary:  Intake/Output Summary (Last 24 hours) at 03/01/2023 1117 Last data filed at 03/01/2023 0825 Gross per 24 hour  Intake --  Output 775 ml  Net -775 ml   LBM: Last BM Date : 02/28/23 Baseline Weight: Weight: 77.2 kg Most recent weight: Weight: 76.4 kg       Palliative Assessment/Data: PPS: 20%      Patient Active Problem List   Diagnosis Date Noted   Mild pulmonic regurgitation and RV dysfunction by prior echocardiogram 02/28/2023   Pulmonary artery hypertension (HCC) 02/28/2023   Bilateral lower leg cellulitis 02/24/2023   Severe sepsis (HCC) 02/23/2023   Acute diastolic (congestive) heart failure (HCC) 02/04/2023   Depression 01/11/2023   Cellulitis of right leg 01/09/2023   Hypokalemia 01/09/2023   Constipation 01/09/2023   Acute pulmonary edema (HCC) 01/03/2023   Acute respiratory failure with hypoxemia (HCC) 01/02/2023   High risk medication use 11/07/2022   Acute bilateral low back pain without sciatica 11/07/2022   ARF (acute renal failure) (HCC) 10/23/2022   Acute renal failure superimposed on stage 3b chronic kidney disease (HCC) 10/22/2022   Chronic heart failure with preserved ejection fraction (HFpEF) (HCC) 10/22/2022   Elevated transaminase level 10/22/2022   Physical  deconditioning 06/14/2022   Acute on chronic diastolic heart failure (HCC) 06/11/2022   Idiopathic acute pancreatitis without necrosis or infection 09/14/2020   Pancreatic cyst 09/14/2020   Pancreatic duct dilated 09/14/2020   Pure hypercholesterolemia 09/14/2020   Hypertensive heart and chronic kidney disease with heart failure and stage 1 through stage 4 chronic kidney disease, or unspecified chronic kidney disease (HCC) 09/28/2018   Spinal stenosis, lumbar region with neurogenic claudication 09/26/2018   Osteoarthritis of right knee 11/21/2017   Acute on chronic diastolic CHF (congestive heart failure) (HCC)    S/P thoracentesis    Acute respiratory failure (HCC)    Rash in adult 08/30/2017   Atrial fibrillation and flutter (HCC)    DOE (dyspnea on exertion) 08/29/2017   Arterial hypotension    Chronic anticoagulation 12/19/2016   AKI (acute kidney injury) (HCC)    Low TSH level 02/19/2016   PAF (paroxysmal atrial fibrillation) (HCC) 02/18/2016   Ankle fracture, left-s/p surgery 02/18/16 02/18/2016   Closed displaced trimalleolar fracture of left ankle 02/18/2016   Acute bronchitis 02/10/2015   Bronchopneumonia 12/02/2013   Chronic kidney disease, stage 3b (HCC) 12/04/2012   Hx of adenomatous colonic polyps    Routine general medical examination at a health care facility 09/05/2011   Cervical cancer screening 02/24/2011   SCIATICA, BILATERAL 09/01/2009   Sprain of sacroiliac region 09/01/2009   ANEMIA 11/07/2007   Osteopenia of the elderly 11/07/2007   INSOMNIA 11/07/2007   EDEMA 08/14/2007   Hyperlipidemia 04/04/2007   PANIC DISORDER 04/04/2007   Essential hypertension 04/04/2007   Anxiety state 12/27/2006   Arthropathy 12/27/2006    Palliative Care Assessment & Plan    Assessment/Recommendations/Plan  End stage heart failure- transition to full comfort measures only, d/c to [facility] with hospice On discharge, would recommend scripts for: - Oxycodone Concentrate  20mg /ml: 5mg  (0.82ml) sublingual every 1 hour as needed for pain or shortness of breath: Disp 30ml - Lorazepam 2mg /ml concentrated solution: 1mg  (0.39ml) sublingual every 4 hours as needed for anxiety: Disp 30ml - Haldol  2mg /ml solution: 0.5mg  (0.81ml) sublingual every 4 hours as needed for agitation or nausea: Disp 30ml  Continue po torsemide as long as she can take pills Transition to comfort measures  Code Status:   Code Status: Limited: Do not attempt resuscitation (DNR) -DNR-LIMITED -Do Not Intubate/DNI    Prognosis:  Unable to determine  Discharge Planning: To Be Determined  Care plan was discussed with patient's daughter in law [place].   Thank you for allowing the Palliative Medicine Team to assist in the care of this patient.  Total time:   Prolonged billing:  Time includes:   Preparing to see the patient (e.g., review of tests) Obtaining and/or reviewing separately obtained history Performing a medically necessary appropriate examination and/or evaluation Counseling and educating the patient/family/caregiver Ordering medications, tests, or procedures Referring and communicating with other health care professionals (when not reported separately) Documenting clinical information in the electronic or other health record Independently interpreting results (not reported separately) and communicating results to the patient/family/caregiver Care coordination (not reported separately) Clinical documentation  Ocie Bob, AGNP-C Palliative Medicine   Please contact Palliative Medicine Team phone at [phone] for questions and concerns.

## 2023-03-02 ENCOUNTER — Other Ambulatory Visit: Payer: Self-pay | Admitting: Family Medicine

## 2023-03-02 ENCOUNTER — Encounter (HOSPITAL_COMMUNITY): Payer: Self-pay | Admitting: Student

## 2023-03-02 ENCOUNTER — Other Ambulatory Visit: Payer: Self-pay | Admitting: General Practice

## 2023-03-02 DIAGNOSIS — I480 Paroxysmal atrial fibrillation: Secondary | ICD-10-CM | POA: Diagnosis not present

## 2023-03-02 DIAGNOSIS — L03116 Cellulitis of left lower limb: Secondary | ICD-10-CM | POA: Diagnosis not present

## 2023-03-02 DIAGNOSIS — N179 Acute kidney failure, unspecified: Secondary | ICD-10-CM | POA: Diagnosis not present

## 2023-03-02 DIAGNOSIS — A419 Sepsis, unspecified organism: Secondary | ICD-10-CM | POA: Diagnosis not present

## 2023-03-02 DIAGNOSIS — I5033 Acute on chronic diastolic (congestive) heart failure: Secondary | ICD-10-CM | POA: Diagnosis not present

## 2023-03-02 MED ORDER — HALOPERIDOL LACTATE 2 MG/ML PO CONC: 0.6000 mg | Freq: Four times a day (QID) | ORAL | 0 refills | Status: AC | PRN

## 2023-03-02 MED ORDER — GLYCOPYRROLATE 1 MG PO TABS: 1.0000 mg | ORAL_TABLET | ORAL | Status: AC | PRN

## 2023-03-02 MED ORDER — LORAZEPAM 2 MG/ML PO CONC: 0.6000 mg | ORAL | 0 refills | Status: AC | PRN

## 2023-03-02 MED ORDER — OXYCODONE HCL 20 MG/ML PO CONC: 6.0000 mg | Freq: Four times a day (QID) | ORAL | 0 refills | Status: AC | PRN

## 2023-03-02 MED ORDER — ALBUTEROL SULFATE (2.5 MG/3ML) 0.083% IN NEBU: 3.0000 mL | INHALATION_SOLUTION | RESPIRATORY_TRACT | Status: AC | PRN

## 2023-03-02 NOTE — Progress Notes (Signed)
Called report to Alinda Money, Charity fundraiser at [facility] [phone].

## 2023-03-02 NOTE — Progress Notes (Addendum)
Daily Progress Note   Patient Name: Brenda Matthews       Date: 03/02/2023 DOB: 1935/05/24  Age: 88 y.o. MRN#: [id number] Attending Physician: Meredeth Ide, MD Primary Care Physician: Karna Dupes, MD Admit Date: 02/23/2023  Reason for Consultation/Follow-up: Establishing goals of care  Patient Profile/HPI:  88 y.o. female  with past medical history of chronic anemia, chronic leg edema, CKD 3b, DM2, HTN, HLD, afib on Eliquis, anxiety, chronic hear failure, multiple recent admissions for heart failure,  admitted on 02/23/2023 with lower extremity cellulitis and heart failure exacerbation with acute on chronic kidney injury. Palliative medicine consulted for goals of care.   Subjective: Chart reviewed including labs, progress notes, imaging from this and previous encounters.  Medication use reviewed- received xanax at 0733 this morning, robinul at 0454, oxycodone last night at 1934, 2138, and this morning at 0733.  On eval- sitting up in bed, awake and alert- enjoying visit with family members.  Discussed with daughter and law Noami and son Onalee Hua outside room- per [employer] she has been sleeping most of the time- but wakes periodically and interacts with family before falling back asleep.  Had some pain all over last night- better with oxycodone.  Plan is for d/c back to [facility] with hospice today. She appears stable for transfer. Discussed dying process and expectations as she progresses through stages of dying.    Review of Systems  Unable to perform ROS: Mental status change     Physical Exam Vitals and nursing note reviewed.  Constitutional:      Appearance: She is ill-appearing.  Cardiovascular:     Rate and Rhythm: Normal rate.  Pulmonary:     Comments: Increased effort and  rate Skin:    Coloration: Skin is pale.             Vital Signs: BP (!) 151/83 (BP Location: Left Arm)   Pulse 96   Temp 98.2 F (36.8 C) (Oral)   Resp 18   Ht 5' 0.98" (1.549 m)   Wt 76.4 kg   LMP  (LMP Unknown)   SpO2 97%   BMI 31.84 kg/m  SpO2: SpO2: 97 % O2 Device: O2 Device: Nasal Cannula O2 Flow Rate: O2 Flow Rate (L/min): 1 L/min  Intake/output summary:  Intake/Output Summary (Last 24 hours) at  03/02/2023 1051 Last data filed at 03/02/2023 0802 Gross per 24 hour  Intake --  Output 450 ml  Net -450 ml   LBM: Last BM Date : 03/01/23 Baseline Weight: Weight: 77.2 kg Most recent weight: Weight: 76.4 kg       Palliative Assessment/Data: PPS: 20%      Patient Active Problem List   Diagnosis Date Noted   Mild pulmonic regurgitation and RV dysfunction by prior echocardiogram 02/28/2023   Pulmonary artery hypertension (HCC) 02/28/2023   Bilateral lower leg cellulitis 02/24/2023   Severe sepsis (HCC) 02/23/2023   Acute diastolic (congestive) heart failure (HCC) 02/04/2023   Depression 01/11/2023   Cellulitis of right leg 01/09/2023   Hypokalemia 01/09/2023   Constipation 01/09/2023   Acute pulmonary edema (HCC) 01/03/2023   Acute respiratory failure with hypoxemia (HCC) 01/02/2023   High risk medication use 11/07/2022   Acute bilateral low back pain without sciatica 11/07/2022   ARF (acute renal failure) (HCC) 10/23/2022   Acute renal failure superimposed on stage 3b chronic kidney disease (HCC) 10/22/2022   Chronic heart failure with preserved ejection fraction (HFpEF) (HCC) 10/22/2022   Elevated transaminase level 10/22/2022   Physical deconditioning 06/14/2022   Acute on chronic diastolic heart failure (HCC) 06/11/2022   Idiopathic acute pancreatitis without necrosis or infection 09/14/2020   Pancreatic cyst 09/14/2020   Pancreatic duct dilated 09/14/2020   Pure hypercholesterolemia 09/14/2020   Hypertensive heart and chronic kidney disease with heart  failure and stage 1 through stage 4 chronic kidney disease, or unspecified chronic kidney disease (HCC) 09/28/2018   Spinal stenosis, lumbar region with neurogenic claudication 09/26/2018   Osteoarthritis of right knee 11/21/2017   Acute on chronic diastolic CHF (congestive heart failure) (HCC)    S/P thoracentesis    Acute respiratory failure (HCC)    Rash in adult 08/30/2017   Atrial fibrillation and flutter (HCC)    DOE (dyspnea on exertion) 08/29/2017   Arterial hypotension    Chronic anticoagulation 12/19/2016   AKI (acute kidney injury) (HCC)    Low TSH level 02/19/2016   PAF (paroxysmal atrial fibrillation) (HCC) 02/18/2016   Ankle fracture, left-s/p surgery 02/18/16 02/18/2016   Closed displaced trimalleolar fracture of left ankle 02/18/2016   Acute bronchitis 02/10/2015   Bronchopneumonia 12/02/2013   Chronic kidney disease, stage 3b (HCC) 12/04/2012   Hx of adenomatous colonic polyps    Routine general medical examination at a health care facility 09/05/2011   Cervical cancer screening 02/24/2011   SCIATICA, BILATERAL 09/01/2009   Sprain of sacroiliac region 09/01/2009   ANEMIA 11/07/2007   Osteopenia of the elderly 11/07/2007   INSOMNIA 11/07/2007   EDEMA 08/14/2007   Hyperlipidemia 04/04/2007   PANIC DISORDER 04/04/2007   Essential hypertension 04/04/2007   Anxiety state 12/27/2006   Arthropathy 12/27/2006    Palliative Care Assessment & Plan    Assessment/Recommendations/Plan  End stage heart failure- transition to full comfort measures only, d/c to [facility] with hospice On discharge, would recommend scripts for: - Oxycodone Concentrate 20mg /ml: 5mg  (0.61ml) sublingual every 1 hour as needed for pain or shortness of breath: Disp 30ml - Lorazepam 2mg /ml concentrated solution: 1mg  (0.42ml) sublingual every 4 hours as needed for anxiety: Disp 30ml - Haldol 2mg /ml solution: 0.5mg  (0.49ml) sublingual every 4 hours as needed for agitation or nausea: Disp 30ml   Continue po torsemide as long as she can take pills Transition to comfort measures Appreciate TOC coordinating disposition and hospice- she is stable for transfer Gold DNR form placed on  chart HCPOA document printed and placed on chart  Code Status:   Code Status: Do not attempt resuscitation (DNR) - Comfort care   Prognosis:  Unable to determine  Discharge Planning: To Be Determined  Care plan was discussed with patient's family and care team.   Thank you for allowing the Palliative Medicine Team to assist in the care of this patient.  :   Preparing to see the patient (e.g., review of tests) Obtaining and/or reviewing separately obtained history Performing a medically necessary appropriate examination and/or evaluation Counseling and educating the patient/family/caregiver Ordering medications, tests, or procedures Referring and communicating with other health care professionals (when not reported separately) Documenting clinical information in the electronic or other health record Independently interpreting results (not reported separately) and communicating results to the patient/family/caregiver Care coordination (not reported separately) Clinical documentation  Ocie Bob, AGNP-C Palliative Medicine   Please contact Palliative Medicine Team phone at [phone] for questions and concerns.

## 2023-03-02 NOTE — Progress Notes (Signed)
   This pt was referred to hospice care in hopes to return to [facility] with hospice services. We reviewed chart and was able to talk with the pt's daughter and confirm interest and goals of care with hospice services. I have spoken to my MD and she has been approved for hospice care back at [facility]. The facility has given [place] the order to enroll pt to services upon her arrival today. The family reports pt will d/c today and will call us once they are in the facility for our nurse to come out from hospice.   Norm Parcel RN [phone]

## 2023-03-02 NOTE — TOC Transition Note (Signed)
Transition of Care [facility]) - Discharge Note   Patient Details  Name: Brenda Matthews MRN: [id number] Date of Birth: 1935/01/26  [facility] CM/SW Contact:  Beckie Busing, RN Phone Number:[phone]  03/02/2023, 2:28 PM   Clinical Narrative:    CM verified with Dru at [facility] that patient is ready to return. Please call report to [facility] [phone] [place]  Rm # [phone]. Transportation has been arranged per [facility]. Discharge packet is at nurses station. [name] at bedside made aware. No other TOC needs noted at this time. TOC will sign off.      Barriers to Discharge: Continued Medical Work up   Patient Goals and CMS Choice            Discharge Placement                       Discharge Plan and Services Additional resources added to the After Visit Summary for   In-house Referral: Clinical Social Work                                   Social Drivers of Health (SDOH) Interventions SDOH Screenings   Food Insecurity: No Food Insecurity (03/02/2023)  Housing: Low Risk  (03/02/2023)  Transportation Needs: No Transportation Needs (03/02/2023)  Utilities: Not At Risk (03/02/2023)  Alcohol Screen: Low Risk  (10/11/2020)  Depression (PHQ2-9): Low Risk  (09/01/2022)  Financial Resource Strain: Low Risk  (10/27/2021)  Physical Activity: Sufficiently Active (10/27/2021)  Social Connections: Socially Isolated (03/02/2023)  Stress: No Stress Concern Present (10/27/2021)  Tobacco Use: Low Risk  (03/02/2023)     Readmission Risk Interventions    02/24/2023   12:52 PM 02/06/2023    2:09 PM 10/24/2022    9:08 AM  Readmission Risk Prevention Plan  Post Dischage Appt   Complete  Medication Screening   Complete  Transportation Screening Complete Complete Complete  HRI or Home Care Consult  Complete   Social Work Consult for Recovery Care Planning/Counseling  Complete   Palliative Care Screening  Not Applicable   Medication Review Furniture conservator/restorer) Complete Complete   HRI or Home Care Consult Complete    SW Recovery Care/Counseling Consult Complete    Palliative Care Screening Not Applicable    Skilled Nursing Facility Complete

## 2023-03-02 NOTE — Discharge Summary (Signed)
Physician Discharge Summary   Patient: Brenda Matthews MRN: [id number] DOB: 04-21-1935  Admit date:     02/23/2023  Discharge date: 03/02/23  Discharge Physician: Meredeth Ide   PCP: Karna Dupes, MD   Recommendations at discharge:   Patient to discharge to [facility] with hospice  Discharge Diagnoses: Principal Problem:   Severe sepsis [facility]) Active Problems:   PAF (paroxysmal atrial fibrillation) (HCC)   Acute on chronic diastolic heart failure (HCC)   Acute renal failure superimposed on stage 3b chronic kidney disease (HCC)   Bilateral lower leg cellulitis   Mild pulmonic regurgitation and RV dysfunction by prior echocardiogram   Pulmonary artery hypertension (HCC)  Resolved Problems:   * No resolved hospital problems. *  Hospital Course: 88 y.o. female with medical history significant for chronic anemia, chronic leg edema, CKD 3B, non-insulin-dependent diabetes, hypertension, hyperlipidemia, atrial fibrillation on Eliquis, anxiety, DDD, and chronic diastolic heart failure who presented from her SNF admission of fever, leg swelling and redness. (Recently hospitalized 12/16 and 1/18 for CHF exacerbation/Afib RVR). Hospitalist called for admission.     Assessment and Plan:  Acute hypoxic respiratory failure, multifactorial, improving Rule out heart failure exacerbation/volume overload, HFpEF Pleural effusion, right -Cardiology was consulted, patient does have pulmonary hypertension with moderate tricuspid regurgitation -Received IV Lasix, switch to torsemide 20 mg daily -Cardio GI signed off     Severe sepsis Hypotension BLE cellulitis - Continue ceftriaxone given nonpurulent erythema, cultures remain negative - Hypotension improved with IV fluids, remains borderline hypotensive but avoiding further IV fluids in the setting of heart failure exacerbation - Bilateral lower extremity edema improved    AKI on CKD 3B - Baseline creatinine labile typically around 1.5 -  Creatinine has improved to 1.77     Urinary retention, unspecified  -Patient has notable history of urinary retention when hospitalized, unclear etiology but likely positional -Foley placed 02/26/2023   -Continue Foley catheter, patient is comfort measures only.   A-fib, rate controlled -Status post cardioversion 1/28 -remains rate controlled -Discontinue amiodarone and apixaban as patient is comfort measures only.   Goals of care -Dr. Natale Milch had lengthy discussion with family daily -Discussed at length with patient 2/10 - while she is averse to the words "palliative" and "hospice" we discussed there are treatments that she would not want (including things like surgery) and that moving forward with palliative is just deciding what she does and does not want to including her health care. -Appreciate palliative care having discussion with family and patient about goals of care moving forward as well as will likely need palliative care follow up in the outpatient setting -Patient remains DNR per discussion with son, healthcare power of attorney, given previous discussion with patient concurrent with her wishes. -Today patient was transition to comfort care measures as per palliative.  Patient to go to [facility] with hospice          Consultants: Palliative care Procedures performed:  Disposition: Skilled nursing facility Diet recommendation:  Discharge Diet Orders (From admission, onward)     Start     Ordered   03/02/23 0000  Diet - low sodium heart healthy        03/02/23 1250           Regular diet DISCHARGE MEDICATION: Allergies as of 03/02/2023       Reactions   Augmentin [amoxicillin-pot Clavulanate] Nausea And Vomiting, Other (See Comments)   "projectile vomiting" Has patient had a PCN reaction causing immediate rash, facial/tongue/throat  swelling, SOB or lightheadedness with hypotension:No Has patient had a PCN reaction causing severe rash involving mucus  membranes or skin necrosis:No Has patient had a PCN reaction that required hospitalization:No Has patient had a PCN reaction occurring within the last 10 years:Yes If all of the above answers are "NO", then may proceed with Cephalosporin use.   Amoxicillin Rash   Clindamycin/lincomycin Rash        Medication List     STOP taking these medications    albuterol 108 (90 Base) MCG/ACT inhaler Commonly known as: VENTOLIN HFA Replaced by: albuterol (2.5 MG/3ML) 0.083% nebulizer solution   ALPRAZolam 0.5 MG dissolvable tablet Commonly known as: NIRAVAM   amiodarone 200 MG tablet Commonly known as: PACERONE   apixaban 2.5 MG Tabs tablet Commonly known as: ELIQUIS   atorvastatin 40 MG tablet Commonly known as: LIPITOR   Biotin 5000 MCG Tabs   CALCIUM 600 PO   carboxymethylcellulose 0.5 % Soln Commonly known as: REFRESH PLUS   Centrum Silver Ultra Womens Tabs   ciprofloxacin 500 MG tablet Commonly known as: CIPRO   Coenzyme Q10 200 MG capsule   EUCERIN ADVANCED REPAIR EX   ferrous sulfate 325 (65 FE) MG tablet   gabapentin 100 MG capsule Commonly known as: NEURONTIN   hydrALAZINE 10 MG tablet Commonly known as: APRESOLINE   hydrocortisone 2.5 % cream   ipratropium-albuterol 0.5-2.5 (3) MG/3ML Soln Commonly known as: DUONEB   Jardiance 10 MG Tabs tablet Generic drug: empagliflozin   Melatonin 5 MG Caps   Menthol (Topical Analgesic) 4 % Gel   metoprolol tartrate 50 MG tablet Commonly known as: LOPRESSOR   PREVAGEN PO       TAKE these medications    acetaminophen 500 MG tablet Commonly known as: TYLENOL Take 2 tablets (1,000 mg total) by mouth every 8 (eight) hours as needed. What changed: when to take this   albuterol (2.5 MG/3ML) 0.083% nebulizer solution Commonly known as: PROVENTIL Inhale 3 mLs into the lungs every 4 (four) hours as needed for shortness of breath. Replaces: albuterol 108 (90 Base) MCG/ACT inhaler   fluticasone 50 MCG/ACT  nasal spray Commonly known as: FLONASE Place 2 sprays into both nostrils daily.   glycopyrrolate 1 MG tablet Commonly known as: ROBINUL Take 1 tablet (1 mg total) by mouth every 4 (four) hours as needed (excessive secretions).   haloperidol 2 MG/ML solution Commonly known as: HALDOL Place 0.3 mLs (0.6 mg total) under the tongue every 6 (six) hours as needed for agitation (or delirium).   LORazepam 2 MG/ML concentrated solution Commonly known as: ATIVAN Take 0.3 mLs (0.6 mg total) by mouth every 4 (four) hours as needed for anxiety.   oxyCODONE 20 MG/ML concentrated solution Commonly known as: ROXICODONE INTENSOL Take 0.3 mLs (6 mg total) by mouth every 6 (six) hours as needed for moderate pain (pain score 4-6) (or dyspnea).   torsemide 10 MG tablet Commonly known as: DEMADEX Take 2 tablets (20 mg total) by mouth daily.        Contact information for after-discharge care     Destination     [facility] .   Service: Skilled Nursing Contact information: [address] [place] [address] [phone]                    Discharge Exam: Filed Weights   02/28/23 0500 02/28/23 1400 03/01/23 0440  Weight: 73.6 kg 74.6 kg 76.4 kg   General-appears in no acute  distress Heart-S1-S2, regular, no murmur auscultated Lungs-clear to auscultation bilaterally, no wheezing or crackles auscultated Abdomen-soft, nontender, no organomegaly Extremities-no edema in the lower extremities Neuro-alert, oriented x3, no focal deficit noted  Condition at discharge: good  The results of significant diagnostics from this hospitalization (including imaging, microbiology, ancillary and laboratory) are listed below for reference.   Imaging Studies: ECHOCARDIOGRAM LIMITED Result Date: 02/26/2023    ECHOCARDIOGRAM LIMITED REPORT   Patient Name:   ROSAMAE ROCQUE Date of Exam: 02/26/2023 Medical Rec #:  [id number]    Height:       61.0 in Accession #:     [id number]   Weight:       169.3 lb Date of Birth:  [birth year]    BSA:          1.760 m Patient Age:    88 years     BP:           111/38 mmHg Patient Gender: F            HR:           70 bpm. Exam Location:  Inpatient Procedure: Limited Echo, Cardiac Doppler and Color Doppler Indications:    I50.40* Unspecified combined systolic (congestive) and diastolic                 (congestive) heart failure  History:        Patient has prior history of Echocardiogram examinations, most                 recent 01/03/2023. CHF, Abnormal ECG, Arrythmias:Atrial                 Fibrillation, Signs/Symptoms:Bacteremia, Edema, Shortness of                 Breath and Dyspnea; Risk Factors:Dyslipidemia.  Sonographer:    Sheralyn Boatman RDCS Referring Phys: [phone] PHILIP J NAHSER  Sonographer Comments: Technically difficult study due to poor echo windows. Image acquisition challenging due to patient body habitus. Patient could not follow commands. No on- axis apical. Patient would not respond, RN notified. Limited study to evaluate volume status and ventricular function. IMPRESSIONS  1. Left ventricular ejection fraction, by estimation, is 60 to 65%. The left ventricle has normal function. The left ventricle has no regional wall motion abnormalities. There is mild concentric left ventricular hypertrophy. Left ventricular diastolic parameters are consistent with Grade I diastolic dysfunction (impaired relaxation). There is the interventricular septum is flattened in systole and diastole, consistent with right ventricular pressure and volume overload.  2. Right ventricular systolic function is mildly reduced. The right ventricular size is mildly enlarged. There is moderately elevated pulmonary artery systolic pressure. The estimated right ventricular systolic pressure is 48.2 mmHg.  3. Left atrial size was mildly dilated.  4. Right atrial size was mildly dilated.  5. Left pleural effusion present.  6. The mitral valve is grossly normal. Mild  mitral valve regurgitation.  7. Tricuspid valve regurgitation is moderate.  8. The aortic valve is tricuspid. Aortic valve regurgitation is mild.  9. Aortic dilatation noted. There is moderate dilatation of the ascending aorta, measuring 44 mm. 10. The inferior vena cava is dilated in size with <50% respiratory variability, suggesting right atrial pressure of 15 mmHg. Comparison(s): Prior images reviewed side by side. LVEF normal range at 60-65%. Mild RV dysfunction with moderately elevated estimated RVSP. FINDINGS  Left Ventricle: Left ventricular ejection fraction, by estimation, is 60 to 65%. The left ventricle has normal  function. The left ventricle has no regional wall motion abnormalities. The left ventricular internal cavity size was normal in size. There is  mild concentric left ventricular hypertrophy. The interventricular septum is flattened in systole and diastole, consistent with right ventricular pressure and volume overload. Left ventricular diastolic parameters are consistent with Grade I diastolic dysfunction (impaired relaxation). Right Ventricle: The right ventricular size is mildly enlarged. No increase in right ventricular wall thickness. Right ventricular systolic function is mildly reduced. There is moderately elevated pulmonary artery systolic pressure. The tricuspid regurgitant velocity is 2.88 m/s, and with an assumed right atrial pressure of 15 mmHg, the estimated right ventricular systolic pressure is 48.2 mmHg. Left Atrium: Left atrial size was mildly dilated. Right Atrium: Right atrial size was mildly dilated. Pericardium: Left pleural effusion present. There is no evidence of pericardial effusion. Mitral Valve: The mitral valve is grossly normal. Mild mitral valve regurgitation. Tricuspid Valve: The tricuspid valve is normal in structure. Tricuspid valve regurgitation is moderate . No evidence of tricuspid stenosis. Aortic Valve: The aortic valve is tricuspid. Aortic valve regurgitation  is mild. Pulmonic Valve: The pulmonic valve was not well visualized. Aorta: Aortic dilatation noted. There is moderate dilatation of the ascending aorta, measuring 44 mm. Venous: The inferior vena cava is dilated in size with less than 50% respiratory variability, suggesting right atrial pressure of 15 mmHg. IAS/Shunts: No atrial level shunt detected by color flow Doppler. Additional Comments: There is pleural effusion in the left lateral region. Spectral Doppler performed. Color Doppler performed.  LEFT VENTRICLE PLAX 2D LVIDd:         5.00 cm LVIDs:         3.10 cm LV PW:         1.10 cm LV IVS:        1.10 cm  LV Volumes (MOD) LV vol d, MOD A2C: 62.0 ml LV vol d, MOD A4C: 106.0 ml LV vol s, MOD A2C: 20.0 ml LV vol s, MOD A4C: 35.6 ml LV SV MOD A2C:     42.0 ml LV SV MOD A4C:     106.0 ml LV SV MOD BP:      61.6 ml RIGHT VENTRICLE         IVC TAPSE (M-mode): 2.2 cm  IVC diam: 3.20 cm LEFT ATRIUM           Index        RIGHT ATRIUM           Index LA Vol (A2C): 15.2 ml 8.64 ml/m   RA Area:     23.30 cm LA Vol (A4C): 56.1 ml 31.88 ml/m  RA Volume:   68.80 ml  39.10 ml/m  AORTIC VALVE LVOT Vmax:   111.00 cm/s LVOT Vmean:  72.700 cm/s LVOT VTI:    0.215 m  AORTA Ao Asc diam: 4.35 cm MITRAL VALVE                TRICUSPID VALVE MV Area (PHT): 4.21 cm     TR Peak grad:   33.2 mmHg MV Decel Time: 180 msec     TR Vmax:        288.00 cm/s MV E velocity: 102.00 cm/s MV A velocity: 85.70 cm/s   SHUNTS MV E/A ratio:  1.19         Systemic VTI: 0.22 m Nona Dell MD Electronically signed by Nona Dell MD Signature Date/Time: 02/26/2023/3:21:46 PM    Final    DG Chest 2 View Result Date:  02/23/2023 CLINICAL DATA:  Worsening bilateral LE swelling, low O2 sats, Hx of heart failure. EXAM: CHEST - 2 VIEW COMPARISON:  02/13/2023. FINDINGS: Low lung volume. There is blunting of bilateral posterior costophrenic angles, left more than right. There are probable associated compressive atelectatic changes. Bilateral lung  fields are otherwise clear. No pneumothorax. No pulmonary edema. Stable cardio-mediastinal silhouette. No acute osseous abnormalities. The soft tissues are within normal limits. IMPRESSION: Bilateral small pleural effusions, left more than right. Electronically Signed   By: Jules Schick M.D.   On: 02/23/2023 15:40   EP STUDY Result Date: 02/14/2023 See surgical note for result.  DG Chest 2 View Result Date: 02/13/2023 CLINICAL DATA:  Follow-up pleural effusions EXAM: CHEST - 2 VIEW COMPARISON:  02/12/2023 FINDINGS: Cardiac shadow is enlarged but stable. Aortic calcifications are again seen. Previously seen small pleural effusions have reduced in size when compared with the prior exam. No focal infiltrate is seen. No bony abnormality is noted. IMPRESSION: Small effusions slightly decreased when compared with the previous day. Electronically Signed   By: Alcide Clever M.D.   On: 02/13/2023 22:46   DG CHEST PORT 1 VIEW Result Date: 02/12/2023 CLINICAL DATA:  Shortness of breath.  Pedal edema. EXAM: PORTABLE CHEST 1 VIEW COMPARISON:  02/09/2023 FINDINGS: Unchanged heart size and mediastinal contours. Aortic atherosclerosis. Increasing bilateral pleural effusions and bibasilar volume loss, typical of atelectasis. Slight vascular congestion without edema. No pneumothorax. IMPRESSION: 1. Increasing bilateral pleural effusions and bibasilar atelectasis. 2. Slight vascular congestion without edema. Electronically Signed   By: Narda Rutherford M.D.   On: 02/12/2023 18:35   DG Chest 2 View Result Date: 02/09/2023 CLINICAL DATA:  88 year old female "pneumonia". EXAM: CHEST - 2 VIEW COMPARISON:  Portable chest 02/04/2023 and earlier. FINDINGS: AP and lateral views at 0909 hours. Large lung volumes. Decreased but not resolved bilateral layering pleural effusions, now small and still larger on the left. Evidence of underlying cardiomegaly. Calcified aortic atherosclerosis. No pneumothorax or pulmonary edema. Pulmonary  vascularity appears to be at baseline, no overt edema. No pneumothorax. Visualized tracheal air column is within normal limits. Osteopenia. No acute osseous abnormality identified. Paucity of bowel gas. IMPRESSION: 1. Decreased but not resolved bilateral layering pleural effusions, still larger on the left. 2. No other acute cardiopulmonary abnormality. Cardiomegaly. Aortic Atherosclerosis (ICD10-I70.0). Electronically Signed   By: Odessa Fleming M.D.   On: 02/09/2023 09:19   CT HEAD WO CONTRAST ( ) Result Date: 02/07/2023 CLINICAL DATA:  Altered mental status, severe delirium EXAM: CT HEAD WITHOUT CONTRAST TECHNIQUE: Contiguous axial images were obtained from the base of the skull through the vertex without intravenous contrast. RADIATION DOSE REDUCTION: This exam was performed according to the departmental dose-optimization program which includes automated exposure control, adjustment of the mA and/or kV according to patient size and/or use of iterative reconstruction technique. COMPARISON:  10/13/2017 FINDINGS: Evaluation is limited by motion. Brain: No evidence of acute infarction, hemorrhage, mass, mass effect, or midline shift. No hydrocephalus or extra-axial fluid collection. Periventricular white matter changes, likely the sequela of chronic small vessel ischemic disease. Age related cerebral atrophy. Vascular: No hyperdense vessel. Atherosclerotic calcifications in the intracranial carotid and vertebral arteries. Skull: Negative for fracture or focal lesion. Sinuses/Orbits: No acute finding. IMPRESSION: Evaluation is limited by motion. Within this limitation, no acute intracranial process. Electronically Signed   By: Wiliam Ke M.D.   On: 02/07/2023 15:33   DG Chest Port 1 View Result Date: 02/04/2023 CLINICAL DATA:  89 year old female with history of sepsis.  EXAM: PORTABLE CHEST 1 VIEW COMPARISON:  Chest x-ray 01/02/2023. FINDINGS: Lung volumes are low. Bibasilar opacities (left-greater-than-right)  which may reflect areas of atelectasis and/or consolidation, with superimposed small right and moderate left pleural effusions. No pneumothorax. There is cephalization of the pulmonary vasculature and slight indistinctness of the interstitial markings suggestive of mild pulmonary edema. Mild cardiomegaly. The patient is rotated to the left on today's exam, resulting in distortion of the mediastinal contours and reduced diagnostic sensitivity and specificity for mediastinal pathology. Atherosclerotic calcifications are noted in the thoracic aorta. IMPRESSION: 1. The appearance the chest is most suggestive of congestive heart failure, as above. 2. Extensive bibasilar areas of atelectasis and/or consolidation. 3. Aortic atherosclerosis. Electronically Signed   By: Trudie Reed M.D.   On: 02/04/2023 06:36    Microbiology: Results for orders placed or performed during the hospital encounter of 02/23/23  Culture, blood (routine x 2)     Status: None   Collection Time: 02/23/23  3:39 PM   Specimen: BLOOD LEFT HAND  Result Value Ref Range Status   Specimen Description   Final    BLOOD LEFT HAND Performed at [facility], [address] [place], [place] [address]    Special Requests   Final    BOTTLES DRAWN AEROBIC AND ANAEROBIC Blood Culture adequate volume Performed at [facility], [address], [place], [place] [address]    Culture   Final    NO GROWTH 5 DAYS Performed at [facility], [address] [place], [place] [address]    Report Status 02/28/2023 FINAL  Final  Culture, blood (routine x 2)     Status: None   Collection Time: 02/23/23  3:55 PM   Specimen: BLOOD RIGHT WRIST  Result Value Ref Range Status   Specimen Description   Final    BLOOD RIGHT WRIST Performed at [facility], [address], [place], [place] [address]    Special Requests   Final    BOTTLES DRAWN AEROBIC AND ANAEROBIC Blood Culture adequate volume Performed at [facility], [address] [place], [place] [address]    Culture   Final    NO GROWTH 5 DAYS Performed at [facility], [address], [place], [place] [address]    Report Status 02/28/2023 FINAL  Final  Resp panel by RT-PCR (RSV, Flu A&B, Covid) Anterior Nasal Swab     Status: None   Collection Time: 02/23/23  6:56 PM   Specimen: Anterior Nasal Swab  Result Value Ref Range Status   SARS Coronavirus 2 by RT PCR NEGATIVE NEGATIVE Final    Comment: (NOTE) SARS-CoV-2 target nucleic acids are NOT DETECTED.  The SARS-CoV-2 RNA is generally detectable in upper respiratory specimens during the acute phase of infection. The lowest concentration of SARS-CoV-2 viral copies this assay can detect is 138 copies/mL. A negative result does not preclude SARS-Cov-2 infection and should not be used as the sole basis for treatment or other patient management decisions. A negative result may occur with  improper specimen collection/handling, submission of specimen other than nasopharyngeal swab, presence of viral mutation(s) within the areas targeted by this assay, and inadequate number of viral copies(<138 copies/mL). A negative result must be combined with clinical observations, patient history, and epidemiological information. The expected result is Negative.  Fact Sheet for Patients:  BloggerCourse.com  Fact Sheet for Healthcare Providers:  SeriousBroker.it  This test is no t yet approved or cleared by  the [employer] and  has been authorized for detection and/or diagnosis of SARS-CoV-2 by FDA under an Emergency Use Authorization (EUA). This EUA will remain  in effect (meaning this test can be used) for the duration of the COVID-19 declaration under Section 564(b)(1) of the Act, 21 U.S.C.section 360bbb-3(b)(1), unless the authorization is terminated  or revoked sooner.       Influenza A by PCR NEGATIVE NEGATIVE  Final   Influenza B by PCR NEGATIVE NEGATIVE Final    Comment: (NOTE) The Xpert Xpress SARS-CoV-2/FLU/RSV plus assay is intended as an aid in the diagnosis of influenza from Nasopharyngeal swab specimens and should not be used as a sole basis for treatment. Nasal washings and aspirates are unacceptable for Xpert Xpress SARS-CoV-2/FLU/RSV testing.  Fact Sheet for Patients: BloggerCourse.com  Fact Sheet for Healthcare Providers: SeriousBroker.it  This test is not yet approved or cleared by the [place] FDA and has been authorized for detection and/or diagnosis of SARS-CoV-2 by FDA under an Emergency Use Authorization (EUA). This EUA will remain in effect (meaning this test can be used) for the duration of the COVID-19 declaration under Section 564(b)(1) of the Act, 21 U.S.C. section 360bbb-3(b)(1), unless the authorization is terminated or revoked.     Resp Syncytial Virus by PCR NEGATIVE NEGATIVE Final    Comment: (NOTE) Fact Sheet for Patients: BloggerCourse.com  Fact Sheet for Healthcare Providers: SeriousBroker.it  This test is not yet approved or cleared by the [place] FDA and has been authorized for detection and/or diagnosis of SARS-CoV-2 by FDA under an Emergency Use Authorization (EUA). This EUA will remain in effect (meaning this test can be used) for the duration of the COVID-19 declaration under Section 564(b)(1) of the Act, 21 U.S.C. section 360bbb-3(b)(1), unless the authorization is terminated or revoked.  Performed at [facility], [address], [place], [place] [address]   MRSA Next Gen by PCR, Nasal     Status: None   Collection Time: 02/24/23  1:17 AM   Specimen: Nasal Mucosa; Nasal Swab  Result Value Ref Range Status   MRSA by PCR Next Gen NOT DETECTED NOT DETECTED Final    Comment: (NOTE) The GeneXpert MRSA Assay (FDA  approved for NASAL specimens only), is one component of a comprehensive MRSA colonization surveillance program. It is not intended to diagnose MRSA infection nor to guide or monitor treatment for MRSA infections. Test performance is not FDA approved in patients less than 32 years old. Performed at [facility], [address], [place], [place] [address]     Labs: CBC: Recent Labs  Lab 02/23/23 1539 02/24/23 0339 02/27/23 0746  WBC 13.5* 13.2* 17.7*  NEUTROABS 11.3*  --   --   HGB 7.3* 8.6* 7.4*  HCT 24.8* 31.1* 25.2*  MCV 93.6 97.5 94.0  PLT 269 289 286   Basic Metabolic Panel: Recent Labs  Lab 02/24/23 0339 02/26/23 0307 02/27/23 0746 02/28/23 0253 03/01/23 0717  NA 142 140 138 140 143  K 4.4 3.9 3.9 3.7 3.7  CL 108 106 105 104 107  CO2 22 25 24 27 26   GLUCOSE 108* 124* 95 95 94  BUN 51* 53* 51* 47* 45*  CREATININE 2.65* 2.16* 1.93* 1.76* 1.77*  CALCIUM 8.2* 8.5* 8.5* 8.6* 9.0  MG 2.3  --   --   --   --   PHOS 5.0*  --   --   --   --    Liver Function Tests: Recent  Labs  Lab 02/23/23 1539  AST 49*  ALT 35  ALKPHOS 76  BILITOT 0.5  PROT 5.2*  ALBUMIN 1.9*   CBG: No results for input(s): "GLUCAP" in the last 168 hours.  Discharge time spent: greater than 30 minutes.  Signed: Meredeth Ide, MD [facility] 03/02/2023

## 2023-03-02 NOTE — Plan of Care (Signed)
  Problem: Education: Goal: Knowledge of General Education information will improve Description: Including pain rating scale, medication(s)/side effects and non-pharmacologic comfort measures Outcome: Completed/Met   Problem: Health Behavior/Discharge Planning: Goal: Ability to manage health-related needs will improve Outcome: Completed/Met   Problem: Clinical Measurements: Goal: Ability to maintain clinical measurements within normal limits will improve Outcome: Completed/Met Goal: Will remain free from infection Outcome: Completed/Met Goal: Diagnostic test results will improve Outcome: Completed/Met Goal: Respiratory complications will improve Outcome: Completed/Met Goal: Cardiovascular complication will be avoided Outcome: Completed/Met   Problem: Activity: Goal: Risk for activity intolerance will decrease Outcome: Completed/Met   Problem: Nutrition: Goal: Adequate nutrition will be maintained Outcome: Completed/Met   Problem: Coping: Goal: Level of anxiety will decrease Outcome: Completed/Met   Problem: Elimination: Goal: Will not experience complications related to bowel motility Outcome: Completed/Met Goal: Will not experience complications related to urinary retention Outcome: Completed/Met   Problem: Pain Managment: Goal: General experience of comfort will improve and/or be controlled Outcome: Completed/Met   Problem: Safety: Goal: Ability to remain free from injury will improve Outcome: Completed/Met   Problem: Skin Integrity: Goal: Risk for impaired skin integrity will decrease Outcome: Completed/Met   Problem: Education: Goal: Knowledge of the prescribed therapeutic regimen will improve Outcome: Completed/Met   Problem: Coping: Goal: Ability to identify and develop effective coping behavior will improve Outcome: Completed/Met   Problem: Clinical Measurements: Goal: Quality of life will improve Outcome: Completed/Met   Problem:  Respiratory: Goal: Verbalizations of increased ease of respirations will increase Outcome: Completed/Met   Problem: Role Relationship: Goal: Family's ability to cope with current situation will improve Outcome: Completed/Met Goal: Ability to verbalize concerns, feelings, and thoughts to partner or family member will improve Outcome: Completed/Met   Problem: Pain Management: Goal: Satisfaction with pain management regimen will improve Outcome: Completed/Met   Problem: Education: Goal: Knowledge of the prescribed therapeutic regimen will improve Outcome: Completed/Met   Problem: Coping: Goal: Ability to identify and develop effective coping behavior will improve Outcome: Completed/Met   Problem: Clinical Measurements: Goal: Quality of life will improve Outcome: Completed/Met   Problem: Respiratory: Goal: Verbalizations of increased ease of respirations will increase Outcome: Completed/Met   Problem: Role Relationship: Goal: Family's ability to cope with current situation will improve Outcome: Completed/Met Goal: Ability to verbalize concerns, feelings, and thoughts to partner or family member will improve Outcome: Completed/Met   Problem: Pain Management: Goal: Satisfaction with pain management regimen will improve Outcome: Completed/Met

## 2023-03-02 NOTE — TOC Progression Note (Addendum)
Transition of Care [facility]) - Progression Note    Patient Details  Name: Brenda Matthews MRN: [id number] Date of Birth: [birth year]  Transition of [facility]) CM/SW Contact  Beckie Busing, RN Phone [phone]  03/02/2023, 10:18 AM  Clinical Narrative:    CM spoke with Dru in admissions at [facility]. Per Dru facility is expecting patient to return and facility has a long term care bed. Dru is requesting that Hospice be in place and see patient to prevent delay in care once patient arrives to facility. CM at bedside to update daughter and offer choice for Hospice. Choice for Hospice is [facility]. CM attempted to call hospice referral with no answer. Voicemail has been left awaiting return call.   1106 CM spoke with Cherie to confirm referral. Information has been given. Cherie will follow up with CM once everything is in place for patient to return to [facility] with Hospice services.   1200 CM has received confirmation from Cherie with [facility] to confirm that everything is set up for patient to discharge to [facility] today. MD has been updated.  1338 Discharge summary available and faxed to [facility]. Message has been sent to Dru with admissions to confirm facility has received d/c summary.    Expected Discharge Plan: Skilled Nursing Facility Barriers to Discharge: Continued Medical Work up  Expected Discharge Plan and Services In-house Referral: Clinical Social Work     Living arrangements for the past 2 months: Independent Living Facility                                       Social Determinants of Health (SDOH) Interventions SDOH Screenings   Food Insecurity: No Food Insecurity (02/23/2023)  Housing: Low Risk  (02/23/2023)  Transportation Needs: No Transportation Needs (02/23/2023)  Utilities: Not At Risk (02/23/2023)  Alcohol Screen: Low Risk  (10/11/2020)  Depression (PHQ2-9): Low Risk  (09/01/2022)  Financial Resource Strain: Low  Risk  (10/27/2021)  Physical Activity: Sufficiently Active (10/27/2021)  Social Connections: Moderately Integrated (02/23/2023)  Stress: No Stress Concern Present (10/27/2021)  Tobacco Use: Low Risk  (02/28/2023)    Readmission Risk Interventions    02/24/2023   12:52 PM 02/06/2023    2:09 PM 10/24/2022    9:08 AM  Readmission Risk Prevention Plan  Post Dischage Appt   Complete  Medication Screening   Complete  Transportation Screening Complete Complete Complete  HRI or Home Care Consult  Complete   Social Work Consult for Recovery Care Planning/Counseling  Complete   Palliative Care Screening  Not Applicable   Medication Review Oceanographer) Complete Complete   HRI or Home Care Consult Complete    SW Recovery Care/Counseling Consult Complete    Palliative Care Screening Not Applicable    Skilled Nursing Facility Complete

## 2023-03-07 DIAGNOSIS — J90 Pleural effusion, not elsewhere classified: Secondary | ICD-10-CM | POA: Diagnosis not present

## 2023-03-07 DIAGNOSIS — A419 Sepsis, unspecified organism: Secondary | ICD-10-CM | POA: Diagnosis not present

## 2023-03-07 DIAGNOSIS — R627 Adult failure to thrive: Secondary | ICD-10-CM | POA: Diagnosis not present

## 2023-03-07 DIAGNOSIS — N183 Chronic kidney disease, stage 3 unspecified: Secondary | ICD-10-CM | POA: Diagnosis not present

## 2023-03-07 DIAGNOSIS — D631 Anemia in chronic kidney disease: Secondary | ICD-10-CM | POA: Diagnosis not present

## 2023-03-07 DIAGNOSIS — Z6833 Body mass index (BMI) 33.0-33.9, adult: Secondary | ICD-10-CM | POA: Diagnosis not present

## 2023-03-07 DIAGNOSIS — Z515 Encounter for palliative care: Secondary | ICD-10-CM | POA: Diagnosis not present

## 2023-03-07 DIAGNOSIS — N179 Acute kidney failure, unspecified: Secondary | ICD-10-CM | POA: Diagnosis not present

## 2023-03-07 DIAGNOSIS — J9620 Acute and chronic respiratory failure, unspecified whether with hypoxia or hypercapnia: Secondary | ICD-10-CM | POA: Diagnosis not present

## 2023-03-08 DIAGNOSIS — F411 Generalized anxiety disorder: Secondary | ICD-10-CM | POA: Diagnosis not present

## 2023-03-10 DIAGNOSIS — F411 Generalized anxiety disorder: Secondary | ICD-10-CM | POA: Diagnosis not present

## 2023-03-15 DIAGNOSIS — F411 Generalized anxiety disorder: Secondary | ICD-10-CM | POA: Diagnosis not present

## 2023-03-24 DIAGNOSIS — Z515 Encounter for palliative care: Secondary | ICD-10-CM | POA: Diagnosis not present

## 2023-03-24 DIAGNOSIS — I5032 Chronic diastolic (congestive) heart failure: Secondary | ICD-10-CM | POA: Diagnosis not present

## 2023-04-03 DIAGNOSIS — R220 Localized swelling, mass and lump, head: Secondary | ICD-10-CM | POA: Diagnosis not present

## 2023-04-18 DEATH — deceased
